# Patient Record
Sex: Male | Born: 1950 | Race: Black or African American | Hispanic: No | Marital: Married | Smoking: Former smoker
Health system: Southern US, Community
[De-identification: ages and names within clinical notes are randomized; demographics above are authoritative.]

## PROBLEM LIST (undated history)

## (undated) DIAGNOSIS — G4730 Sleep apnea, unspecified: Secondary | ICD-10-CM

## (undated) DIAGNOSIS — K219 Gastro-esophageal reflux disease without esophagitis: Secondary | ICD-10-CM

## (undated) DIAGNOSIS — N289 Disorder of kidney and ureter, unspecified: Secondary | ICD-10-CM

## (undated) DIAGNOSIS — F4310 Post-traumatic stress disorder, unspecified: Secondary | ICD-10-CM

## (undated) DIAGNOSIS — E079 Disorder of thyroid, unspecified: Secondary | ICD-10-CM

## (undated) DIAGNOSIS — Z940 Kidney transplant status: Secondary | ICD-10-CM

## (undated) DIAGNOSIS — I739 Peripheral vascular disease, unspecified: Secondary | ICD-10-CM

## (undated) DIAGNOSIS — I10 Essential (primary) hypertension: Secondary | ICD-10-CM

## (undated) DIAGNOSIS — F419 Anxiety disorder, unspecified: Secondary | ICD-10-CM

## (undated) DIAGNOSIS — E119 Type 2 diabetes mellitus without complications: Secondary | ICD-10-CM

## (undated) DIAGNOSIS — H548 Legal blindness, as defined in USA: Secondary | ICD-10-CM

## (undated) DIAGNOSIS — Z89511 Acquired absence of right leg below knee: Secondary | ICD-10-CM

## (undated) DIAGNOSIS — Z9989 Dependence on other enabling machines and devices: Secondary | ICD-10-CM

## (undated) HISTORY — PX: AV FISTULA PLACEMENT: SHX1204

## (undated) HISTORY — PX: COLONOSCOPY: SHX174

---

## 1998-04-22 ENCOUNTER — Ambulatory Visit (HOSPITAL_COMMUNITY): Admission: RE | Admit: 1998-04-22 | Discharge: 1998-04-22 | Payer: Self-pay | Admitting: Pulmonary Disease

## 1998-11-27 ENCOUNTER — Encounter: Payer: Self-pay | Admitting: Urology

## 1998-11-27 ENCOUNTER — Ambulatory Visit (HOSPITAL_COMMUNITY): Admission: RE | Admit: 1998-11-27 | Discharge: 1998-11-27 | Payer: Self-pay | Admitting: Urology

## 1998-12-06 ENCOUNTER — Encounter: Payer: Self-pay | Admitting: Urology

## 1998-12-06 ENCOUNTER — Ambulatory Visit (HOSPITAL_COMMUNITY): Admission: RE | Admit: 1998-12-06 | Discharge: 1998-12-06 | Payer: Self-pay | Admitting: Urology

## 1999-02-13 ENCOUNTER — Ambulatory Visit (HOSPITAL_COMMUNITY): Admission: RE | Admit: 1999-02-13 | Discharge: 1999-02-13 | Payer: Self-pay | Admitting: *Deleted

## 1999-10-22 ENCOUNTER — Emergency Department (HOSPITAL_COMMUNITY): Admission: EM | Admit: 1999-10-22 | Discharge: 1999-10-22 | Payer: Self-pay | Admitting: Emergency Medicine

## 1999-11-04 ENCOUNTER — Encounter: Payer: Self-pay | Admitting: *Deleted

## 1999-11-04 ENCOUNTER — Encounter: Admission: RE | Admit: 1999-11-04 | Discharge: 1999-11-04 | Payer: Self-pay | Admitting: *Deleted

## 2001-04-14 ENCOUNTER — Ambulatory Visit (HOSPITAL_COMMUNITY): Admission: RE | Admit: 2001-04-14 | Discharge: 2001-04-14 | Payer: Self-pay | Admitting: *Deleted

## 2001-04-14 ENCOUNTER — Encounter: Payer: Self-pay | Admitting: *Deleted

## 2001-08-18 ENCOUNTER — Emergency Department (HOSPITAL_COMMUNITY): Admission: EM | Admit: 2001-08-18 | Discharge: 2001-08-18 | Payer: Self-pay | Admitting: Emergency Medicine

## 2002-02-27 ENCOUNTER — Encounter: Payer: Self-pay | Admitting: Internal Medicine

## 2002-02-27 ENCOUNTER — Encounter: Admission: RE | Admit: 2002-02-27 | Discharge: 2002-02-27 | Payer: Self-pay | Admitting: Internal Medicine

## 2002-03-18 ENCOUNTER — Encounter: Payer: Self-pay | Admitting: Emergency Medicine

## 2002-03-18 ENCOUNTER — Emergency Department (HOSPITAL_COMMUNITY): Admission: EM | Admit: 2002-03-18 | Discharge: 2002-03-18 | Payer: Self-pay | Admitting: Emergency Medicine

## 2002-03-19 ENCOUNTER — Emergency Department (HOSPITAL_COMMUNITY): Admission: EM | Admit: 2002-03-19 | Discharge: 2002-03-19 | Payer: Self-pay | Admitting: *Deleted

## 2002-03-23 ENCOUNTER — Encounter: Admission: RE | Admit: 2002-03-23 | Discharge: 2002-03-23 | Payer: Self-pay | Admitting: Internal Medicine

## 2002-03-23 ENCOUNTER — Encounter: Payer: Self-pay | Admitting: Internal Medicine

## 2002-06-06 ENCOUNTER — Emergency Department (HOSPITAL_COMMUNITY): Admission: EM | Admit: 2002-06-06 | Discharge: 2002-06-06 | Payer: Self-pay | Admitting: Emergency Medicine

## 2002-09-23 ENCOUNTER — Emergency Department (HOSPITAL_COMMUNITY): Admission: EM | Admit: 2002-09-23 | Discharge: 2002-09-23 | Payer: Self-pay | Admitting: Emergency Medicine

## 2002-12-07 ENCOUNTER — Encounter: Payer: Self-pay | Admitting: Internal Medicine

## 2002-12-07 ENCOUNTER — Encounter: Admission: RE | Admit: 2002-12-07 | Discharge: 2002-12-07 | Payer: Self-pay | Admitting: Internal Medicine

## 2003-05-30 ENCOUNTER — Encounter: Admission: RE | Admit: 2003-05-30 | Discharge: 2003-05-30 | Payer: Self-pay | Admitting: Internal Medicine

## 2003-05-30 ENCOUNTER — Encounter: Payer: Self-pay | Admitting: Internal Medicine

## 2004-10-02 ENCOUNTER — Encounter: Admission: RE | Admit: 2004-10-02 | Discharge: 2004-10-02 | Payer: Self-pay | Admitting: Nephrology

## 2005-01-21 ENCOUNTER — Encounter (HOSPITAL_COMMUNITY): Admission: RE | Admit: 2005-01-21 | Discharge: 2005-04-21 | Payer: Self-pay | Admitting: Nephrology

## 2005-02-01 ENCOUNTER — Emergency Department (HOSPITAL_COMMUNITY): Admission: EM | Admit: 2005-02-01 | Discharge: 2005-02-01 | Payer: Self-pay | Admitting: Emergency Medicine

## 2005-02-04 ENCOUNTER — Emergency Department (HOSPITAL_COMMUNITY): Admission: EM | Admit: 2005-02-04 | Discharge: 2005-02-04 | Payer: Self-pay | Admitting: Emergency Medicine

## 2005-02-11 ENCOUNTER — Emergency Department (HOSPITAL_COMMUNITY): Admission: EM | Admit: 2005-02-11 | Discharge: 2005-02-11 | Payer: Self-pay | Admitting: Emergency Medicine

## 2005-02-23 ENCOUNTER — Emergency Department (HOSPITAL_COMMUNITY): Admission: EM | Admit: 2005-02-23 | Discharge: 2005-02-23 | Payer: Self-pay | Admitting: Emergency Medicine

## 2005-04-22 ENCOUNTER — Encounter (HOSPITAL_COMMUNITY): Admission: RE | Admit: 2005-04-22 | Discharge: 2005-07-21 | Payer: Self-pay | Admitting: Nephrology

## 2005-05-21 ENCOUNTER — Emergency Department (HOSPITAL_COMMUNITY): Admission: EM | Admit: 2005-05-21 | Discharge: 2005-05-21 | Payer: Self-pay | Admitting: Emergency Medicine

## 2005-07-02 ENCOUNTER — Ambulatory Visit (HOSPITAL_COMMUNITY): Admission: RE | Admit: 2005-07-02 | Discharge: 2005-07-02 | Payer: Self-pay | Admitting: Vascular Surgery

## 2005-07-09 ENCOUNTER — Emergency Department (HOSPITAL_COMMUNITY): Admission: EM | Admit: 2005-07-09 | Discharge: 2005-07-09 | Payer: Self-pay | Admitting: *Deleted

## 2005-07-30 ENCOUNTER — Encounter (HOSPITAL_COMMUNITY): Admission: RE | Admit: 2005-07-30 | Discharge: 2005-10-28 | Payer: Self-pay | Admitting: Nephrology

## 2005-09-22 ENCOUNTER — Emergency Department (HOSPITAL_COMMUNITY): Admission: EM | Admit: 2005-09-22 | Discharge: 2005-09-22 | Payer: Self-pay | Admitting: Emergency Medicine

## 2005-09-26 ENCOUNTER — Emergency Department (HOSPITAL_COMMUNITY): Admission: EM | Admit: 2005-09-26 | Discharge: 2005-09-26 | Payer: Self-pay | Admitting: Emergency Medicine

## 2005-11-06 ENCOUNTER — Encounter (HOSPITAL_COMMUNITY): Admission: RE | Admit: 2005-11-06 | Discharge: 2006-02-04 | Payer: Self-pay | Admitting: Nephrology

## 2005-12-18 ENCOUNTER — Emergency Department (HOSPITAL_COMMUNITY): Admission: EM | Admit: 2005-12-18 | Discharge: 2005-12-18 | Payer: Self-pay | Admitting: Emergency Medicine

## 2006-02-16 ENCOUNTER — Encounter (INDEPENDENT_AMBULATORY_CARE_PROVIDER_SITE_OTHER): Payer: Self-pay | Admitting: Nephrology

## 2006-02-16 ENCOUNTER — Encounter (INDEPENDENT_AMBULATORY_CARE_PROVIDER_SITE_OTHER): Payer: Self-pay | Admitting: Cardiology

## 2006-02-16 ENCOUNTER — Ambulatory Visit (HOSPITAL_COMMUNITY): Admission: RE | Admit: 2006-02-16 | Discharge: 2006-02-16 | Payer: Self-pay | Admitting: Neurology

## 2006-03-15 ENCOUNTER — Encounter (HOSPITAL_COMMUNITY): Admission: RE | Admit: 2006-03-15 | Discharge: 2006-06-13 | Payer: Self-pay | Admitting: Nephrology

## 2006-08-10 ENCOUNTER — Observation Stay (HOSPITAL_COMMUNITY): Admission: EM | Admit: 2006-08-10 | Discharge: 2006-08-11 | Payer: Self-pay | Admitting: Emergency Medicine

## 2006-08-11 ENCOUNTER — Encounter (INDEPENDENT_AMBULATORY_CARE_PROVIDER_SITE_OTHER): Payer: Self-pay | Admitting: Cardiology

## 2006-09-06 ENCOUNTER — Ambulatory Visit (HOSPITAL_COMMUNITY): Admission: RE | Admit: 2006-09-06 | Discharge: 2006-09-06 | Payer: Self-pay | Admitting: Nephrology

## 2006-11-05 ENCOUNTER — Ambulatory Visit: Payer: Self-pay | Admitting: Vascular Surgery

## 2007-02-23 ENCOUNTER — Emergency Department (HOSPITAL_COMMUNITY): Admission: EM | Admit: 2007-02-23 | Discharge: 2007-02-23 | Payer: Self-pay | Admitting: Emergency Medicine

## 2007-05-17 IMAGING — CT CT ANGIO CHEST
2 of 4 series · 19 of 36 positions shown · IV contrast (APPLIED)
Comparison: None available.

CLINICAL DATA: 55-year-old with chest pain, shortness of breath.
 CT ANGIOGRAPHY OF CHEST:
TECHNIQUE: Multidetector CT imaging of the chest was performed during bolus injection of intravenous contrast.  Multiplanar CT angiographic image reconstructions were generated to evaluate the vascular anatomy.
 Contrast:  100 cc Omnipaque 300.

[Series 4: pulm embolism 2.0 st · axial · 0.61mm/px · z∈[-328,-40]mm · 16 of 158 slices shown]
[im 7/158  lung]
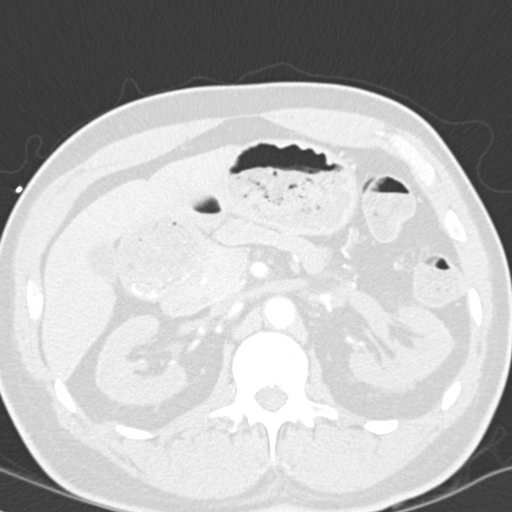
[im 19/158  mediastinal]
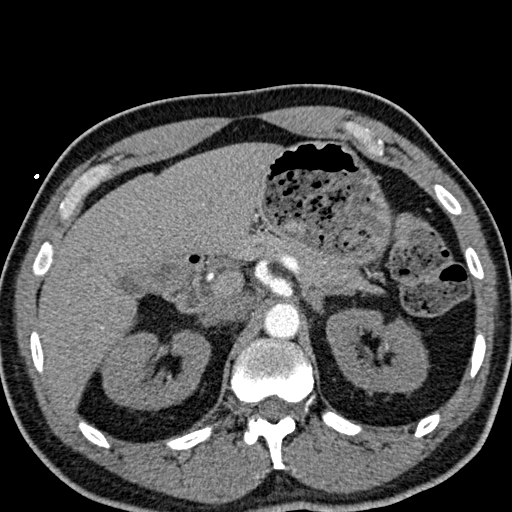
[im 25/158  lung]
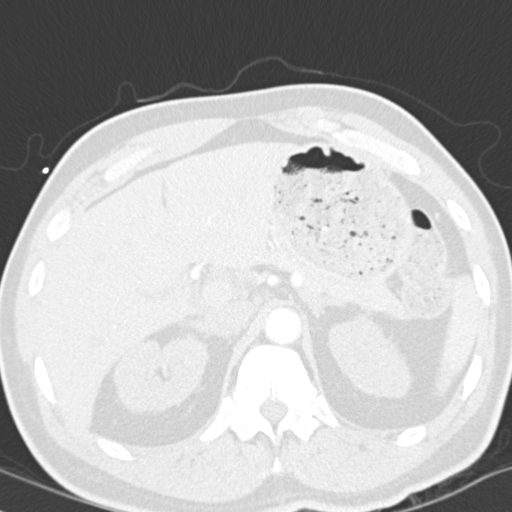
[im 37/158  mediastinal]
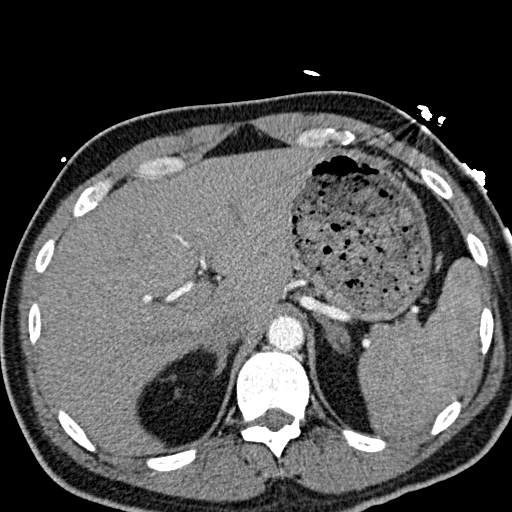
[im 49/158  lung]
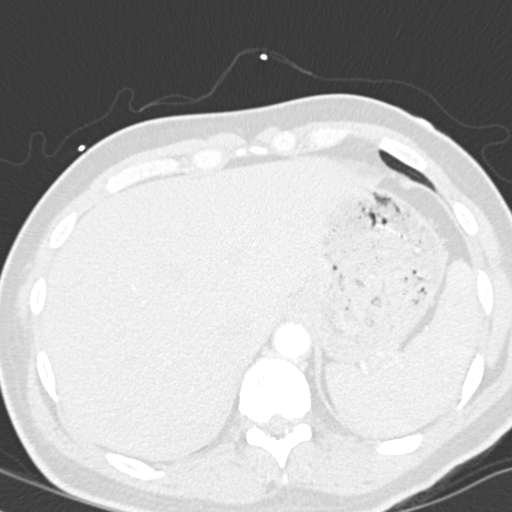
[im 55/158  mediastinal]
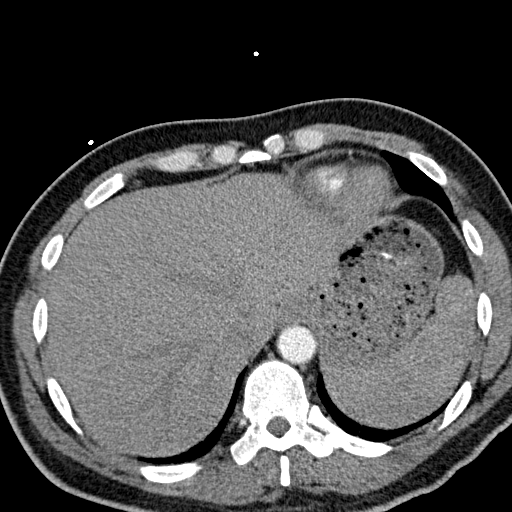
[im 67/158  lung]
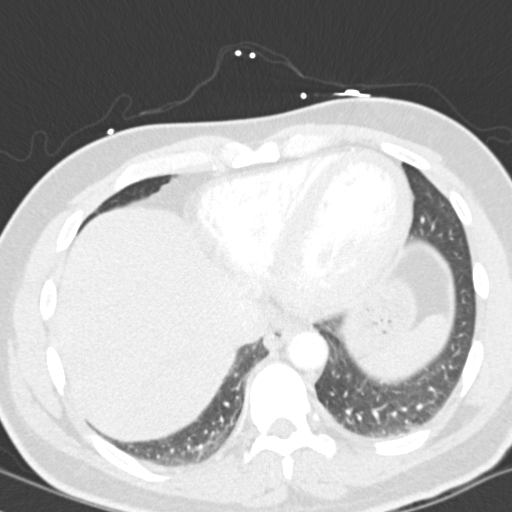
[im 73/158  mediastinal]
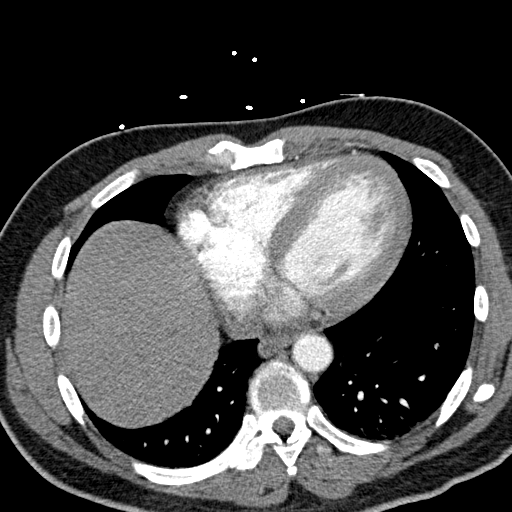
[im 85/158  lung]
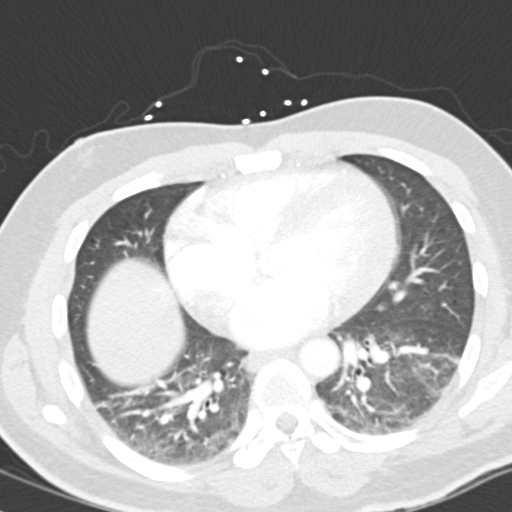
[im 91/158  mediastinal]
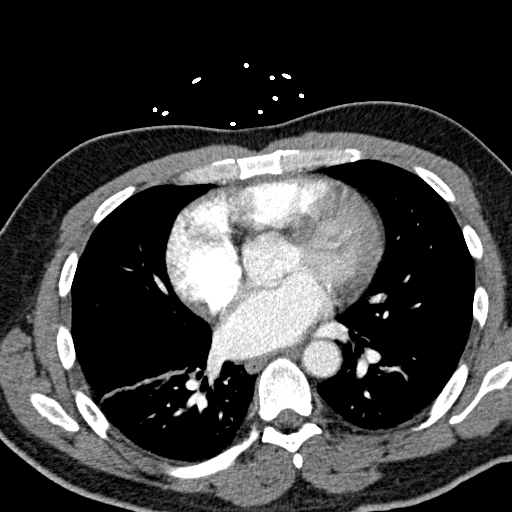
[im 103/158  lung]
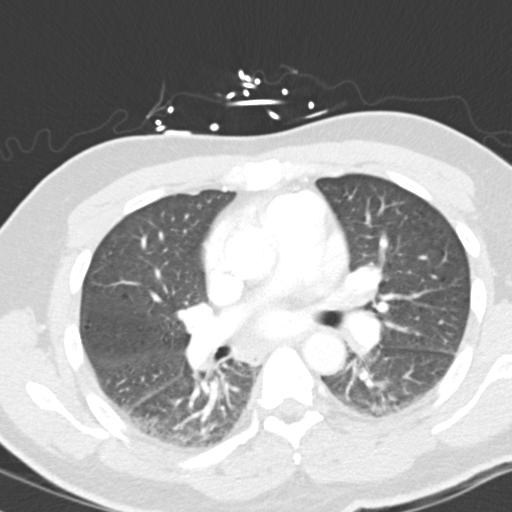
[im 109/158  mediastinal]
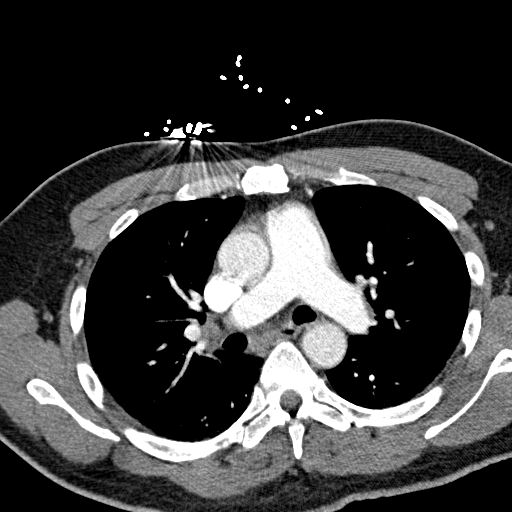
[im 121/158  lung]
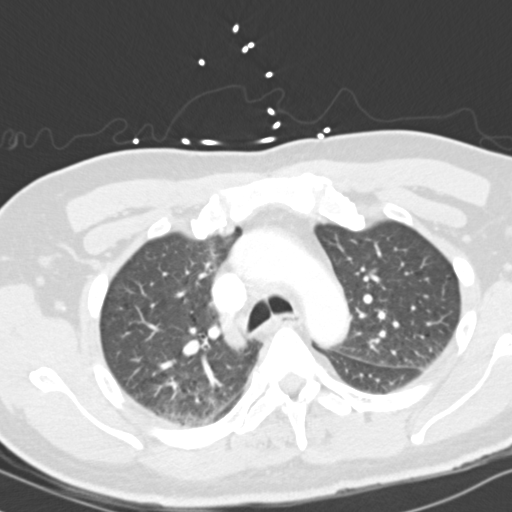
[im 133/158  mediastinal]
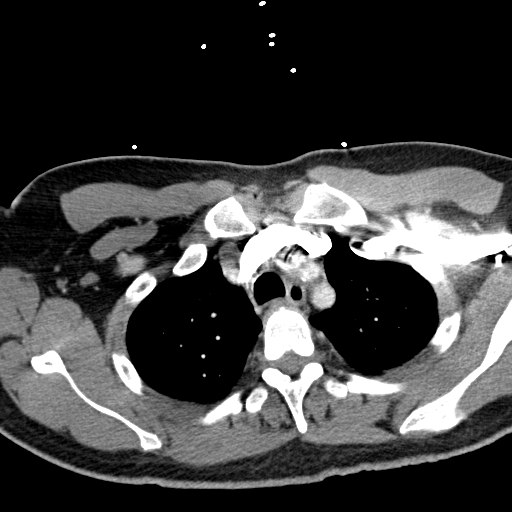
[im 139/158  lung]
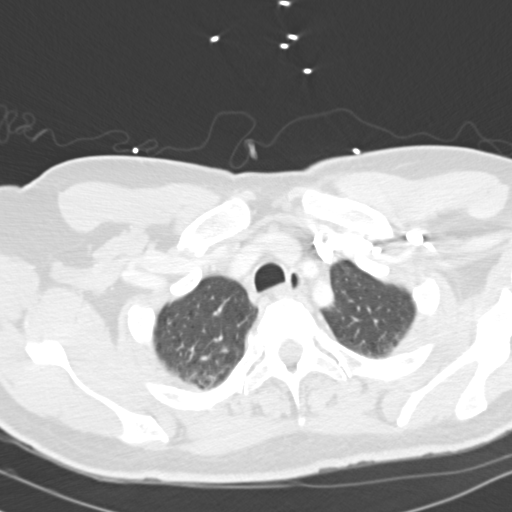
[im 151/158  mediastinal]
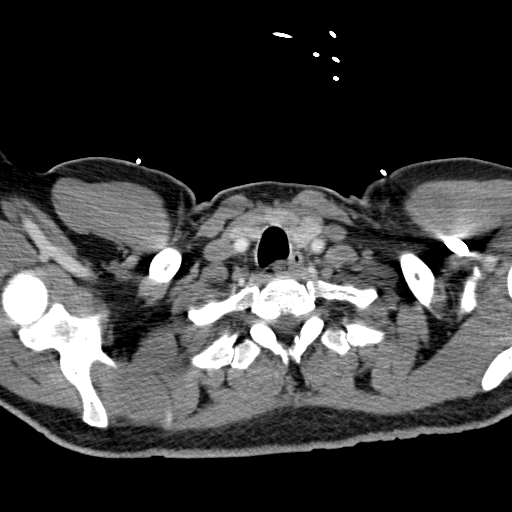

[Series 6: pulm embolism 2.0 cor · coronal · 0.63mm/px · 3 of 114 slices shown]
[im 23/114  mediastinal]
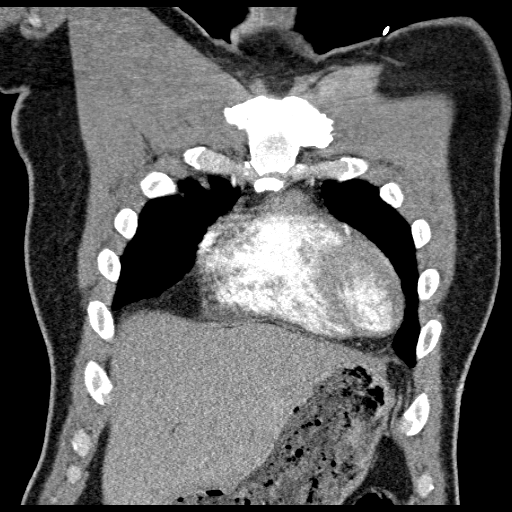
[im 46/114  mediastinal]
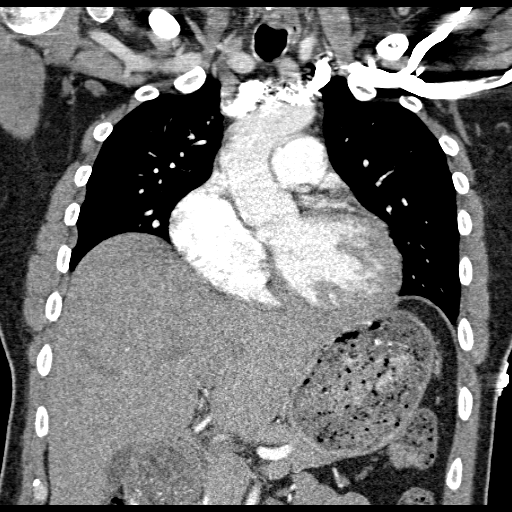
[im 68/114  mediastinal]
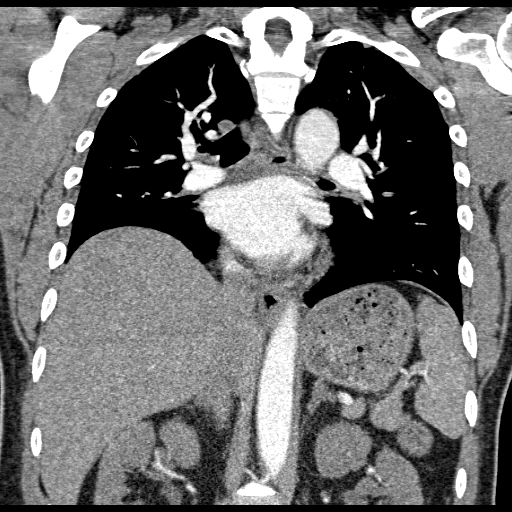

[19 of 36 positions shown; findings below may reference images not displayed]

FINDINGS: Chest wall, soft tissues and bony structures are unremarkable.
 Mild thyroid goiter on the left.  Heart size is borderline.  No pericardial effusion.  No mediastinal or hilar adenopathy.  The aorta is normal in caliber.  No dissection.  
 The pulmonary arterial tree is well opacified.  No filling defects to suggest pulmonary emboli.  
 Examination of the lung parenchyma demonstrates dependent atelectasis.  This is more pronounced in the lung bases.  No edema or effusions.  No worrisome lung masses.  The tracheobronchial tree is grossly normal.  
 The upper abdomen demonstrates no significant findings.
IMPRESSION: 1.  No CT evidence for pulmonary emboli. 
 2.  Borderline cardiac enlargement but no pericardial effusion or mediastinal or hilar adenopathy. 
 3.  Normal appearance of the thoracic aorta.  
 4.  Dependent atelectasis in the lungs but no edema or effusions.

## 2007-06-08 ENCOUNTER — Ambulatory Visit: Payer: Self-pay | Admitting: Gastroenterology

## 2007-06-13 ENCOUNTER — Encounter: Payer: Self-pay | Admitting: Gastroenterology

## 2007-06-13 ENCOUNTER — Ambulatory Visit (HOSPITAL_COMMUNITY): Admission: RE | Admit: 2007-06-13 | Discharge: 2007-06-13 | Payer: Self-pay | Admitting: Gastroenterology

## 2007-06-15 ENCOUNTER — Ambulatory Visit: Payer: Self-pay | Admitting: Vascular Surgery

## 2007-06-16 ENCOUNTER — Ambulatory Visit: Payer: Self-pay | Admitting: Gastroenterology

## 2007-09-29 HISTORY — PX: COMBINED KIDNEY-PANCREAS TRANSPLANT: SHX1382

## 2007-12-19 DIAGNOSIS — I739 Peripheral vascular disease, unspecified: Secondary | ICD-10-CM | POA: Insufficient documentation

## 2007-12-19 DIAGNOSIS — E785 Hyperlipidemia, unspecified: Secondary | ICD-10-CM | POA: Insufficient documentation

## 2007-12-19 DIAGNOSIS — F411 Generalized anxiety disorder: Secondary | ICD-10-CM | POA: Insufficient documentation

## 2007-12-19 DIAGNOSIS — N19 Unspecified kidney failure: Secondary | ICD-10-CM | POA: Insufficient documentation

## 2007-12-19 DIAGNOSIS — E1151 Type 2 diabetes mellitus with diabetic peripheral angiopathy without gangrene: Secondary | ICD-10-CM | POA: Insufficient documentation

## 2007-12-19 DIAGNOSIS — D638 Anemia in other chronic diseases classified elsewhere: Secondary | ICD-10-CM | POA: Insufficient documentation

## 2007-12-19 DIAGNOSIS — G459 Transient cerebral ischemic attack, unspecified: Secondary | ICD-10-CM | POA: Insufficient documentation

## 2007-12-19 DIAGNOSIS — D649 Anemia, unspecified: Secondary | ICD-10-CM | POA: Insufficient documentation

## 2007-12-19 DIAGNOSIS — I10 Essential (primary) hypertension: Secondary | ICD-10-CM | POA: Insufficient documentation

## 2009-07-19 ENCOUNTER — Emergency Department (HOSPITAL_COMMUNITY): Admission: EM | Admit: 2009-07-19 | Discharge: 2009-07-19 | Payer: Self-pay | Admitting: Emergency Medicine

## 2009-10-08 ENCOUNTER — Emergency Department (HOSPITAL_COMMUNITY): Admission: EM | Admit: 2009-10-08 | Discharge: 2009-10-09 | Payer: Self-pay | Admitting: Emergency Medicine

## 2009-10-08 ENCOUNTER — Emergency Department (HOSPITAL_COMMUNITY): Admission: EM | Admit: 2009-10-08 | Discharge: 2009-10-08 | Payer: Self-pay | Admitting: Emergency Medicine

## 2009-10-09 ENCOUNTER — Encounter (INDEPENDENT_AMBULATORY_CARE_PROVIDER_SITE_OTHER): Payer: Self-pay | Admitting: Emergency Medicine

## 2009-10-09 ENCOUNTER — Ambulatory Visit: Payer: Self-pay | Admitting: Vascular Surgery

## 2009-10-09 ENCOUNTER — Ambulatory Visit (HOSPITAL_COMMUNITY): Admission: RE | Admit: 2009-10-09 | Discharge: 2009-10-09 | Payer: Self-pay | Admitting: Emergency Medicine

## 2010-04-02 ENCOUNTER — Emergency Department (HOSPITAL_COMMUNITY): Admission: EM | Admit: 2010-04-02 | Discharge: 2010-04-03 | Payer: Self-pay | Admitting: Emergency Medicine

## 2010-12-14 LAB — CBC
HCT: 46.4 % (ref 39.0–52.0)
HCT: 43.8 % (ref 39.0–52.0)
Hemoglobin: 15.1 g/dL (ref 13.0–17.0)
Hemoglobin: 14.7 g/dL (ref 13.0–17.0)
MCH: 26.8 pg (ref 26.0–34.0)
MCHC: 32.6 g/dL (ref 30.0–36.0)
MCHC: 33.6 g/dL (ref 30.0–36.0)
MCV: 82.2 fL (ref 78.0–100.0)
MCV: 80.2 fL (ref 78.0–100.0)
Platelets: 171 10*3/uL (ref 150–400)
Platelets: 164 10*3/uL (ref 150–400)
RBC: 5.65 MIL/uL (ref 4.22–5.81)
RBC: 5.46 MIL/uL (ref 4.22–5.81)
RDW: 15.5 % (ref 11.5–15.5)
RDW: 15.3 % (ref 11.5–15.5)
WBC: 5.8 10*3/uL (ref 4.0–10.5)
WBC: 7 10*3/uL (ref 4.0–10.5)

## 2010-12-14 LAB — DIFFERENTIAL
Basophils Absolute: 0 10*3/uL (ref 0.0–0.1)
Basophils Absolute: 0 10*3/uL (ref 0.0–0.1)
Basophils Relative: 0 % (ref 0–1)
Basophils Relative: 1 % (ref 0–1)
Eosinophils Absolute: 0.1 10*3/uL (ref 0.0–0.7)
Eosinophils Absolute: 0.1 10*3/uL (ref 0.0–0.7)
Eosinophils Relative: 2 % (ref 0–5)
Eosinophils Relative: 2 % (ref 0–5)
Lymphocytes Relative: 19 % (ref 12–46)
Lymphocytes Relative: 15 % (ref 12–46)
Lymphs Abs: 1.1 10*3/uL (ref 0.7–4.0)
Lymphs Abs: 1 10*3/uL (ref 0.7–4.0)
Monocytes Absolute: 0.4 10*3/uL (ref 0.1–1.0)
Monocytes Absolute: 0.4 10*3/uL (ref 0.1–1.0)
Monocytes Relative: 7 % (ref 3–12)
Monocytes Relative: 6 % (ref 3–12)
Neutro Abs: 4.2 10*3/uL (ref 1.7–7.7)
Neutro Abs: 5.4 10*3/uL (ref 1.7–7.7)
Neutrophils Relative %: 72 % (ref 43–77)
Neutrophils Relative %: 77 % (ref 43–77)

## 2010-12-14 LAB — URINALYSIS, ROUTINE W REFLEX MICROSCOPIC
Bilirubin Urine: NEGATIVE
Glucose, UA: NEGATIVE mg/dL
Hgb urine dipstick: NEGATIVE
Ketones, ur: NEGATIVE mg/dL
Nitrite: NEGATIVE
Protein, ur: NEGATIVE mg/dL
Specific Gravity, Urine: 1.025 (ref 1.005–1.030)
Urobilinogen, UA: 0.2 mg/dL (ref 0.0–1.0)
pH: 5.5 (ref 5.0–8.0)

## 2010-12-14 LAB — PROTIME-INR
INR: 1.07 (ref 0.00–1.49)
Prothrombin Time: 13.8 seconds (ref 11.6–15.2)

## 2010-12-14 LAB — COMPREHENSIVE METABOLIC PANEL
ALT: 22 U/L (ref 0–53)
AST: 15 U/L (ref 0–37)
Albumin: 4.8 g/dL (ref 3.5–5.2)
Alkaline Phosphatase: 79 U/L (ref 39–117)
BUN: 17 mg/dL (ref 6–23)
CO2: 28 mEq/L (ref 19–32)
Calcium: 10 mg/dL (ref 8.4–10.5)
Chloride: 104 mEq/L (ref 96–112)
Creatinine, Ser: 1.09 mg/dL (ref 0.4–1.5)
GFR calc Af Amer: >60 mL/min (ref >60)
GFR calc non Af Amer: >60 mL/min (ref >60)
Glucose, Bld: 152 mg/dL — ABNORMAL HIGH (ref 70–99)
Potassium: 4.1 mEq/L (ref 3.5–5.1)
Sodium: 139 mEq/L (ref 135–145)
Total Bilirubin: 0.4 mg/dL (ref 0.3–1.2)
Total Protein: 7.7 g/dL (ref 6.0–8.3)

## 2010-12-14 LAB — LIPASE, BLOOD: Lipase: 23 U/L (ref 11–59)

## 2010-12-14 LAB — BASIC METABOLIC PANEL
BUN: 12 mg/dL (ref 6–23)
CO2: 27 mEq/L (ref 19–32)
Calcium: 9.8 mg/dL (ref 8.4–10.5)
Chloride: 103 mEq/L (ref 96–112)
Creatinine, Ser: 0.96 mg/dL (ref 0.4–1.5)
GFR calc Af Amer: >60 mL/min (ref >60)
GFR calc non Af Amer: >60 mL/min (ref >60)
Glucose, Bld: 121 mg/dL — ABNORMAL HIGH (ref 70–99)
Potassium: 4.1 mEq/L (ref 3.5–5.1)
Sodium: 140 mEq/L (ref 135–145)

## 2010-12-14 LAB — GLUCOSE, CAPILLARY: Glucose-Capillary: 100 mg/dL — ABNORMAL HIGH (ref 70–99)

## 2010-12-14 LAB — APTT: aPTT: 35 seconds (ref 24–37)

## 2011-01-01 LAB — GLUCOSE, CAPILLARY: Glucose-Capillary: 130 mg/dL — ABNORMAL HIGH (ref 70–99)

## 2011-02-10 NOTE — H&P (Signed)
HISTORY AND PHYSICAL EXAMINATION   June 16, 2007   Re:  DAEGAN, ARIZMENDI                DOB:  1951-03-09   HISTORY OF PRESENT ILLNESS:  The patient is a 60 year old male with  history of end stage renal disease.  He complains of pain in his left  and right legs, the left is worse than the right.  He states that he has  some occasional numbness in his feet and legs at night time.  This  occurs usually at night when at rest.  He does not have any pain in his  lower extremities with walking and walks greater than a mile with no  complaints.  He denies classic rest pain.  He states that the pain  usually resolves when he gets up and walks around.   PAST SURGICAL HISTORY:  Past surgical history is remarkable for  placement of a right upper arm AV fistula.   PAST MEDICAL HISTORY:  Past medical history is otherwise remarkable for  diabetes and hypertension.  He is a former smoker but quit 18 years ago.  He denies a history of coronary artery disease.   FAMILY HISTORY:  His family history is remarkable for his mother who  died at age 74 of a heart attack.   SOCIAL HISTORY:  He is married and works part-time as a Conservator, museum/gallery.  He does not consume alcohol regularly.  Smoking history is as above.   REVIEW OF SYSTEMS:  He is 5 feet 11 inches, 202 pounds.  He denies  shortness of breath, asthma, wheezing, GI bleeding, TIA, stroke,  syncopal episodes, seizures, or other clotting disorders.  He has  multiple joint pains and some anxiety.   MEDICATIONS:  Medications include insulin, Catapres, Xanax, and aspirin.   ALLERGIES:  HE HAS NO KNOWN DRUG ALLERGIES.   PHYSICAL EXAMINATION:  VITAL SIGNS:  Blood pressure is 147/80 in the  left arm, heart rate 75 and regular.  HEENT:  Unremarkable.  NECK:  He has 2+ carotid pulses without bruit.  CHEST:  Clear to auscultation.  CARDIAC:  Regular rate and rhythm without murmur.  He has 2+ brachial  and radial pulses in the  left arm.  He has a fistula in the right upper  arm with easily palpable thrill.  He has absent radial pulses on the  right side.  ABDOMEN:  Soft and nontender.  EXTREMITIES:  He has 2+ femoral pulses bilaterally.  He has a 2+ right  dorsalis pedis pulse and a 1+ left dorsalis pedis pulse.  He has no  posterior pedal pulses bilaterally.  He has no ulcerations on the feet.  There is no edema.   LABORATORY DATA:  He had bilateral ABI performed today, which were 0.95  on the right and 1.0 on the left.  Waveforms in ABI were consistent with  minimal arterial occlusive disease and waveforms were essentially  normal.   ASSESSMENT:  I believe the patient primarily has symptoms of neuropathy  rather than claudication in his lower extremities.  I do not believe he  warrants further followup as far as his lower extremity atherosclerotic  occlusive disease is concerned.  I will defer starting him on treatment  for his neuropathy to Dr. Terrial Rhodes in light of the fact that he  has end stage renal disease and renal failure.  The patient will follow  up with me on an as-needed basis.   Janetta Hora.  Fields, MD  Electronically Signed   CEF/MEDQ  D:  06/16/2007  T:  06/16/2007  Job:  [id number]   cc:   Terrial Rhodes, M.D.

## 2011-02-10 NOTE — Assessment & Plan Note (Signed)
[facility]                         GASTROENTEROLOGY OFFICE NOTE   ILYA, NEELY                       MRN:          [id number]  DATE:06/08/2007                            DOB:          08/25/1951    REFERRING PHYSICIAN:  Duke Salvia. Eliott Nine, M.D.   NEW GI CONSULTATION   PRIMARY CARE PHYSICIAN:  Dr. Daine Floras at the [facility].   REASON FOR REFERRAL:  Dr. Eliott Nine asked me to evaluate Mr. Crager in  consultation regarding colonoscopy and workup for kidney/pancreas  transplant.   HISTORY OF PRESENT ILLNESS:  Mr. Okray is a very pleasant 60 year old  man who has been on hemodialysis for approximately 1 year.  He is listed  for a kidney/pancreas transplant, and as part of the transplant workup  he is required to have a full colonoscopy.  He did have a colonoscopy 7  to 10 years ago by Dr. Dortha Kern and was told that this was  essentially normal.  He has never had troubles with constipation,  diarrhea, or bleeding.   REVIEW OF SYSTEMS:  Essentially normal and is available on his nursing  intake sheet.   PAST MEDICAL HISTORY:  Hypertension.  Diabetes.  Anxiety.  Kidney  failure on Tuesday, Thursday, Saturday hemodialysis.   CURRENT MEDICATIONS:  Humalog.  Lantus.  Colchicine.  Allopurinol.  Xanax.  Caduet.  Aspirin.   ALLERGIES:  No known drug allergies.   SOCIAL HISTORY:  Married with 3 children.  Works for the [employer].  Also works as a Data processing manager.  Nonsmoker.  Nondrinker.   FAMILY HISTORY:  Diabetes runs in his family.  No colon cancer or colon  polyps.   PHYSICAL EXAM:  Height 5 feet 11 inches, 203 pounds, blood pressure  120/70, pulse 60.  CONSTITUTIONAL:  Generally well-appearing.  NEUROLOGIC:  Alert and oriented x3.  EYES:  Extraocular movements are intact.  MOUTH:  Oropharynx moist.  No lesions.  NECK:  Supple.  No lymphadenopathy.  CARDIOVASCULAR:  Heart is regular rate and rhythm.  LUNGS:  Clear to auscultation bilaterally.  ABDOMEN:  Soft, nontender, nondistended, normal bowel sounds.  EXTREMITIES:  No lower extremity edema.  SKIN:  No rashes or lesions on the visible extremities.   ASSESSMENT AND PLAN:  A 60 year old man at routine risk for colorectal  cancer, awaiting kidney/pancreas transplant.   I did not mention above, but he had recent lab tests done 2 months ago  showing that he has very slight anemia with a hemoglobin of 12.9.  Liver  tests are essentially normal.  We will arrange for him to have a  colonoscopy performed at his soonest convenience, and I will communicate  those results to his [facility], as well as Dr. Camille Bal.  As  per protocol, he will take half his insulin the night before and 0 the  morning of the procedure.     Rachael Fee, MD  Electronically Signed    DPJ/MedQ  DD: 06/08/2007  DT: 06/08/2007  Job #: [id number]   cc:   Dr Tami Ribas.  Eliott Nine, M.D.

## 2011-02-13 NOTE — Consult Note (Signed)
NAME:  [name], HEAD NO.:  [id number]   MEDICAL RECORD NO.:  [id number]          PATIENT TYPE:  EMS   LOCATION:  ED                           FACILITY:  [facility]   PHYSICIAN:  Melvyn Novas, M.D.  DATE OF BIRTH:  09/28/1951   DATE OF CONSULTATION:  12/18/2005  DATE OF DISCHARGE:  12/18/2005                                   CONSULTATION   This patient arrived at the [facility] ER at 8:54 by a private  car. 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 The patient  states that first he believed it was a hypoglycemic attack and he ate a  candy bar. He felt that the tingling and the generalized weakness left him  but his left leg is still dragging and he is not quite feeling back to  normal. He has an extensive past medical history.   Diabetes insulin-dependent. He is hypertensive. He has beginning renal  failure. Had already a arteriovenous fistula placed for future hemodialysis  access. That fistula placement took place in December. The patient said that  he also is treated by a [facility] physician for his hypertension and that he  refills his medication at [facility] here in town.   The patient is currently on PhosLo, Altace, Avandia, Caduet, furosemide, and  calcitriol. Again, he feels his medication at [facility] on [address]  [phone] and I will call to get the exact doses.   SOCIAL HISTORY:  The patient works as a Forensic scientist. He is married. States  that he is a nonsmoker, nondrinker.   FAMILY HISTORY:  Positive for hypertension. That has been a grandparent with  diabetes mellitus but nobody with early onset diabetes.   Current labs have returned. The CT scan shows no acute stroke. White blood  cell count 9.4.  Hemoglobin is 12.9, hematocrit is 39.6, platelet count of  177,000. The patient's PT was 12.5 seconds. INR was 0.9. The patient felt,  after being given  some fluid and another sugary drink, much better here in  the ER without resolution of his left lower extremity heaviness and  weakness. Because of this, we are now awaiting an MRI exam. We have ordered  the MRI of the brain without gadolinium given the patient failing renal  function.   VITAL SIGNS:  Temperature is 97.7, blood pressure 162/85 automatic  measurement in a lying position left arm. The patient's pulse rate was 90,  respiratory rate is 18. The patient's lungs are clear to auscultation.  CARDIAC:  No murmur.  No carotid bruit.  No peripheral clubbing, cyanosis or  edema at this point no bruising. The fistula placement site is well-healed  and there is a thrill noted over the fistula.   The patient has the ability to extend both upper extremities against gravity  and perform a finger-nose test without dysmetria, ataxia or tremor.   He can extend both lower extremities antigravity one at that time but has a  significant drift in his left lower  extremity and I rate his strength there  for dorsiflexion, plantar flexion, knee flexion, knee extension at about 4/5  while the right side is full 5/5. He also has an upgoing toe response on his  left side, equivocal response on his right side. Deep tendon reflexes on the  upper extremities and lower extremities are attenuated. Distal vibratory  sense is not felt. Primary modalities to touch, pinprick and vibration on  the upper extremity trunk and face are intact. Cranial nerve exam was fully  intact. The patient has pupils that react equal to light. Full extraocular  movements. Full visual field bilaterally. Symmetric facial strength. Tongue  and uvula move midline. There was no objective viable or visible deficit in  his neck movement but the patient still states that his left face appears  tingling and somewhat as if it was anesthetized.   CONCLUSIONS:  This could be a possible a lacune that has not had the ability  to show up on a  regular CT scan.  We will evaluated him by MRI. The patient  can be admitted to his primary care physician service. We will probably add  an antiplatelet drugs to his current medication as it seems that he has  neither been on aspirin nor Plavix nor Aggrenox at this time.      Melvyn Novas, M.D.  Electronically Signed     CD/MEDQ  D:  12/18/2005  T:  12/19/2005  Job:  [id number]   cc:   Garnetta Buddy, M.D.  Fax: [phone]   [facility]

## 2011-02-13 NOTE — Op Note (Signed)
NAME:  [name], [name]              ACCOUNT NO.:  [id number]   MEDICAL RECORD NO.:  [id number]          PATIENT TYPE:  AMB   LOCATION:  SDS                          FACILITY:  [facility]   PHYSICIAN:  Janetta Hora. Fields, MD  DATE OF BIRTH:  08-Sep-1951   DATE OF PROCEDURE:  DATE OF DISCHARGE:                                 OPERATIVE REPORT   PROCEDURE:  Right brachial cephalic AV fistula.   PREOPERATIVE DIAGNOSIS:  End-stage renal disease.   POSTOPERATIVE DIAGNOSIS:  End-stage renal disease.   ANESTHESIA:  Local with IV sedation.   ASSISTANT:  Emilio Aspen, RNFA   OPERATIVE FINDINGS:  1.  A 4-to-5-mm cephalic vein.   OPERATIVE DETAILS:  After obtaining informed consent, the patient taken to  the operating. The patient placed in the supine position operating table.  After adequate sedation, the patient's entire right upper extremity was  prepped and draped in the usual sterile fashion. Local anesthesia was  infiltrated near the antecubital crease. A transverse incision was made in  this location and carried down through subcutaneous tissues down to the  level of the cephalic vein. The cephalic vein was dissected free  circumferentially.  Small side branches were ligated and divided between  silk ties.   Next, the brachial artery was dissected free in the medial portion of the  incision. The brachial artery was controlled with proximal distal vessel  loops. The patient was given 5000 units of intravenous heparin. A  longitudinal arteriotomy was made. The vein was transected distally and  swung over to the level of the artery. The vein was then sewn end-of-vein to  side of artery using a running 7-0 Prolene suture. Just prior to completion  the anastomosis was fore-bleed, back-bleed and thoroughly flushed.  Anastomosis was secured, clamps released. There was a palpable thrill in the  fistula immediately. Hemostasis was obtained. Subcutaneous tissues  reapproximated using a running  3-0 Vicryl suture. Skin was closed with a 4-0  Vicryl subcuticular stitch. The patient tolerated procedure well and there  were no complications.  Instrument, sponge, and needle counts were correct at the end of the case.  The patient was taken to the recovery room in stable condition.           ______________________________  Janetta Hora Fields, MD     CEF/MEDQ  D:  07/02/2005  T:  07/02/2005  Job:  [id number]

## 2011-02-13 NOTE — Discharge Summary (Signed)
NAME:  [name], [name] NO.:  [id number]   MEDICAL RECORD NO.:  [id number]          PATIENT TYPE:  INP   LOCATION:  5506                         FACILITY:  [facility]   PHYSICIAN:  Neena Rhymes, M.D. DATE OF BIRTH:  [birth year]   DATE OF ADMISSION:  08/10/2006  DATE OF DISCHARGE:  08/11/2006                                 DISCHARGE SUMMARY   DISCHARGE DIAGNOSES:  1. Chest pain.  2. End-stage renal disease.  3. Hypertension.  4. Diabetes.  5. Anemia.  6. Dyslipidemia.   PROCEDURE:  Cardiolite stress test on 08/11/2006 which was read as no  ischemia and normal LV function.   DISCHARGE MEDICATIONS:  1. Colchicine 0.6 mg daily.  2. PhosLo 1 tablet t.i.d. with food.  3. Protonix 40 mg daily.  4. Aspirin 81 mg daily.  5. Altace 20 mg daily.  6. Lasix 30 units nightly.  7. __________  5.5 daily.  8. Caduet 10/10 q.h.s.  9. Humalog 15 units q.a.m. 20 units q.p.m.  10.Metoprolol 12.5 mg b.i.d.  11.EPO 5000 units with each hemodialysis.  12.Hectorol 2 mcg at each hemodialysis.  13.Ferrlecit 62.5 mg every Thursday.   HOSPITAL COURSE:  The patient is a 60 year old gentleman who was admitted  from dialysis with chest pain.  Please see dictated H&P for details.  His  laboratory data from the day of admission 08/10/2006 are as follows:  WBC  12.4, hemoglobin 14.0, hematocrit 43.1, platelets 260, 78% neutrophils, 13%  lymphocytes, 8% monocytes.  PT 12.2, INR 0.9.  Sodium 139, potassium 3.2,  chloride 104, glucose 83, BUN 21, creatinine 3.7, CK-MB 3.5, troponin 0.07,  myoglobin greater than 500.  TSH 1.767.   HOSPITAL COURSE BY PROBLEM LIST:  Problem #1.  CHEST PAIN.  The patient was  admitted for atypical chest pain and [facility]  was consulted due to the patient's extended medical history and multiple  risk factors including hypertension, diabetes, and dyslipidemia.  He had a  Cardiolite stress test performed on 08/11/2006 which showed  normal LV  function; and no evidence of ischemia.  The patient was determined to have  noncardiac chest pain; and was started on Protonix 40 mg at the time of  admission.  At the time of discharge the patient was chest pain free.   Problem 2:  END-STAGE RENAL DISEASE.  The patient is on known hemodialysis;  and has hemodialysis on Tuesday-Thursday-Saturday at the [facility].  He was not dialyzed during his admission.  There no  changes made to his outpatient dialysis regimen.  He will return to his  regular scheduled dialysis at the [facility] tomorrow on  08/12/2006.   Problem #3:  HYPERTENSION.  The patient was admitted with a known history of  hypertension; and for this he is being treated with Caduet and Altace.  During his hospitalization his blood pressure remained elevated at 176/91.  Because of this and has extensive cardiac risk factors, [facility] started him on 12.5 mg of metoprolol b.i.d.  His blood  pressures will also likely  improved with dialysis and this issue will need  to be followed up in the outpatient setting.   Problem #4:  DIABETES.  The patient was admitted with a known history of  diabetes.  He was maintained on his home regimen of 30 units of Lantus  nightly and 16 units of Humalog every morning and 20 units of Humalog every  night.  He was discharged home without complication.   Problem #5:  ANEMIA.  The patient's hemoglobin remained stable during this  admission.  He will be discontinued on his regular schedule of EPO and iron  which he receives at dialysis.   Problem #6:  DYSLIPIDEMIA.  The patient has a known history of elevated  cholesterol for which he is taking Caduet.  He had a fasting lipid panel  performed here at [facility] which showed an LDL cholesterol within  goal at 69, an HDL cholesterol which was low at 29, and triglycerides which  are elevated at 320.  This  issue will need follow up in the outpatient  setting.   DISCHARGE LABORATORY DATA:  From 08/11/2006 is as follows: Sodium 138,  potassium 3.7, chloride 101, bicarb 25, BUN 34, creatinine 5, glucose 263,  albumin 2.4, calcium 8.2, phosphorus 5.6.  His latest cardiac panel showed a  CK of 107, MB of 2.1, relative index of 2, and troponin of 0.06.   FOLLOWUP ITEMS AT DISCHARGE:  1. Patient will need outpatient blood pressure monitoring due to the      addition of metoprolol.  2. The patient will need outpatient lipid monitoring, and possible      adjustment of his medication.      Neena Rhymes, M.D.     KT/MEDQ  D:  08/11/2006  T:  08/11/2006  Job:  [id number]

## 2011-10-07 DIAGNOSIS — Z79899 Other long term (current) drug therapy: Secondary | ICD-10-CM | POA: Diagnosis not present

## 2011-10-07 DIAGNOSIS — Z940 Kidney transplant status: Secondary | ICD-10-CM | POA: Diagnosis not present

## 2011-10-07 DIAGNOSIS — N184 Chronic kidney disease, stage 4 (severe): Secondary | ICD-10-CM | POA: Diagnosis not present

## 2011-10-07 DIAGNOSIS — N2581 Secondary hyperparathyroidism of renal origin: Secondary | ICD-10-CM | POA: Diagnosis not present

## 2011-10-07 DIAGNOSIS — D649 Anemia, unspecified: Secondary | ICD-10-CM | POA: Diagnosis not present

## 2011-10-07 DIAGNOSIS — Z9483 Pancreas transplant status: Secondary | ICD-10-CM | POA: Diagnosis not present

## 2011-10-07 DIAGNOSIS — E785 Hyperlipidemia, unspecified: Secondary | ICD-10-CM | POA: Diagnosis not present

## 2011-10-07 DIAGNOSIS — E119 Type 2 diabetes mellitus without complications: Secondary | ICD-10-CM | POA: Diagnosis not present

## 2011-10-12 DIAGNOSIS — I10 Essential (primary) hypertension: Secondary | ICD-10-CM | POA: Diagnosis not present

## 2011-10-12 DIAGNOSIS — Z940 Kidney transplant status: Secondary | ICD-10-CM | POA: Diagnosis not present

## 2011-10-12 DIAGNOSIS — E559 Vitamin D deficiency, unspecified: Secondary | ICD-10-CM | POA: Diagnosis not present

## 2011-12-16 DIAGNOSIS — M109 Gout, unspecified: Secondary | ICD-10-CM | POA: Diagnosis not present

## 2011-12-16 DIAGNOSIS — Z940 Kidney transplant status: Secondary | ICD-10-CM | POA: Diagnosis not present

## 2011-12-16 DIAGNOSIS — Z79899 Other long term (current) drug therapy: Secondary | ICD-10-CM | POA: Diagnosis not present

## 2011-12-16 DIAGNOSIS — E785 Hyperlipidemia, unspecified: Secondary | ICD-10-CM | POA: Diagnosis not present

## 2011-12-16 DIAGNOSIS — N184 Chronic kidney disease, stage 4 (severe): Secondary | ICD-10-CM | POA: Diagnosis not present

## 2011-12-16 DIAGNOSIS — Z9483 Pancreas transplant status: Secondary | ICD-10-CM | POA: Diagnosis not present

## 2011-12-16 DIAGNOSIS — D649 Anemia, unspecified: Secondary | ICD-10-CM | POA: Diagnosis not present

## 2011-12-16 DIAGNOSIS — E119 Type 2 diabetes mellitus without complications: Secondary | ICD-10-CM | POA: Diagnosis not present

## 2012-01-25 DIAGNOSIS — N184 Chronic kidney disease, stage 4 (severe): Secondary | ICD-10-CM | POA: Diagnosis not present

## 2012-01-25 DIAGNOSIS — D649 Anemia, unspecified: Secondary | ICD-10-CM | POA: Diagnosis not present

## 2012-01-25 DIAGNOSIS — Z9483 Pancreas transplant status: Secondary | ICD-10-CM | POA: Diagnosis not present

## 2012-01-25 DIAGNOSIS — Z940 Kidney transplant status: Secondary | ICD-10-CM | POA: Diagnosis not present

## 2012-01-25 DIAGNOSIS — Z79899 Other long term (current) drug therapy: Secondary | ICD-10-CM | POA: Diagnosis not present

## 2012-01-25 DIAGNOSIS — E785 Hyperlipidemia, unspecified: Secondary | ICD-10-CM | POA: Diagnosis not present

## 2012-01-29 DIAGNOSIS — E559 Vitamin D deficiency, unspecified: Secondary | ICD-10-CM | POA: Diagnosis not present

## 2012-01-29 DIAGNOSIS — Z940 Kidney transplant status: Secondary | ICD-10-CM | POA: Diagnosis not present

## 2012-01-29 DIAGNOSIS — E119 Type 2 diabetes mellitus without complications: Secondary | ICD-10-CM | POA: Diagnosis not present

## 2012-01-29 DIAGNOSIS — I10 Essential (primary) hypertension: Secondary | ICD-10-CM | POA: Diagnosis not present

## 2012-04-11 DIAGNOSIS — Z9483 Pancreas transplant status: Secondary | ICD-10-CM | POA: Diagnosis not present

## 2012-04-11 DIAGNOSIS — Z79899 Other long term (current) drug therapy: Secondary | ICD-10-CM | POA: Diagnosis not present

## 2012-04-11 DIAGNOSIS — E785 Hyperlipidemia, unspecified: Secondary | ICD-10-CM | POA: Diagnosis not present

## 2012-04-11 DIAGNOSIS — D649 Anemia, unspecified: Secondary | ICD-10-CM | POA: Diagnosis not present

## 2012-04-11 DIAGNOSIS — N184 Chronic kidney disease, stage 4 (severe): Secondary | ICD-10-CM | POA: Diagnosis not present

## 2012-04-11 DIAGNOSIS — E119 Type 2 diabetes mellitus without complications: Secondary | ICD-10-CM | POA: Diagnosis not present

## 2012-04-11 DIAGNOSIS — Z940 Kidney transplant status: Secondary | ICD-10-CM | POA: Diagnosis not present

## 2012-08-16 DIAGNOSIS — D649 Anemia, unspecified: Secondary | ICD-10-CM | POA: Diagnosis not present

## 2012-08-16 DIAGNOSIS — N184 Chronic kidney disease, stage 4 (severe): Secondary | ICD-10-CM | POA: Diagnosis not present

## 2012-08-16 DIAGNOSIS — E119 Type 2 diabetes mellitus without complications: Secondary | ICD-10-CM | POA: Diagnosis not present

## 2012-08-16 DIAGNOSIS — Z79899 Other long term (current) drug therapy: Secondary | ICD-10-CM | POA: Diagnosis not present

## 2012-08-16 DIAGNOSIS — E785 Hyperlipidemia, unspecified: Secondary | ICD-10-CM | POA: Diagnosis not present

## 2012-08-16 DIAGNOSIS — Z9483 Pancreas transplant status: Secondary | ICD-10-CM | POA: Diagnosis not present

## 2012-08-16 DIAGNOSIS — Z940 Kidney transplant status: Secondary | ICD-10-CM | POA: Diagnosis not present

## 2012-08-29 DIAGNOSIS — Z9483 Pancreas transplant status: Secondary | ICD-10-CM | POA: Diagnosis not present

## 2012-08-29 DIAGNOSIS — E559 Vitamin D deficiency, unspecified: Secondary | ICD-10-CM | POA: Diagnosis not present

## 2012-08-29 DIAGNOSIS — E119 Type 2 diabetes mellitus without complications: Secondary | ICD-10-CM | POA: Diagnosis not present

## 2012-08-29 DIAGNOSIS — Z940 Kidney transplant status: Secondary | ICD-10-CM | POA: Diagnosis not present

## 2012-08-29 DIAGNOSIS — Z23 Encounter for immunization: Secondary | ICD-10-CM | POA: Diagnosis not present

## 2012-08-29 DIAGNOSIS — I10 Essential (primary) hypertension: Secondary | ICD-10-CM | POA: Diagnosis not present

## 2012-12-16 DIAGNOSIS — N184 Chronic kidney disease, stage 4 (severe): Secondary | ICD-10-CM | POA: Diagnosis not present

## 2012-12-16 DIAGNOSIS — E119 Type 2 diabetes mellitus without complications: Secondary | ICD-10-CM | POA: Diagnosis not present

## 2012-12-16 DIAGNOSIS — D649 Anemia, unspecified: Secondary | ICD-10-CM | POA: Diagnosis not present

## 2012-12-16 DIAGNOSIS — Z79899 Other long term (current) drug therapy: Secondary | ICD-10-CM | POA: Diagnosis not present

## 2012-12-16 DIAGNOSIS — Z9483 Pancreas transplant status: Secondary | ICD-10-CM | POA: Diagnosis not present

## 2012-12-16 DIAGNOSIS — Z940 Kidney transplant status: Secondary | ICD-10-CM | POA: Diagnosis not present

## 2013-03-13 DIAGNOSIS — E119 Type 2 diabetes mellitus without complications: Secondary | ICD-10-CM | POA: Diagnosis not present

## 2013-03-13 DIAGNOSIS — D649 Anemia, unspecified: Secondary | ICD-10-CM | POA: Diagnosis not present

## 2013-03-17 DIAGNOSIS — D649 Anemia, unspecified: Secondary | ICD-10-CM | POA: Diagnosis not present

## 2013-03-17 DIAGNOSIS — Z940 Kidney transplant status: Secondary | ICD-10-CM | POA: Diagnosis not present

## 2013-03-17 DIAGNOSIS — N184 Chronic kidney disease, stage 4 (severe): Secondary | ICD-10-CM | POA: Diagnosis not present

## 2013-03-17 DIAGNOSIS — E785 Hyperlipidemia, unspecified: Secondary | ICD-10-CM | POA: Diagnosis not present

## 2013-03-17 DIAGNOSIS — Z79899 Other long term (current) drug therapy: Secondary | ICD-10-CM | POA: Diagnosis not present

## 2013-03-17 DIAGNOSIS — Z9483 Pancreas transplant status: Secondary | ICD-10-CM | POA: Diagnosis not present

## 2013-03-29 DIAGNOSIS — I10 Essential (primary) hypertension: Secondary | ICD-10-CM | POA: Diagnosis not present

## 2013-03-29 DIAGNOSIS — Z940 Kidney transplant status: Secondary | ICD-10-CM | POA: Diagnosis not present

## 2013-03-29 DIAGNOSIS — Z9483 Pancreas transplant status: Secondary | ICD-10-CM | POA: Diagnosis not present

## 2013-07-12 DIAGNOSIS — Z940 Kidney transplant status: Secondary | ICD-10-CM | POA: Diagnosis not present

## 2013-07-12 DIAGNOSIS — N184 Chronic kidney disease, stage 4 (severe): Secondary | ICD-10-CM | POA: Diagnosis not present

## 2013-07-12 DIAGNOSIS — Z9483 Pancreas transplant status: Secondary | ICD-10-CM | POA: Diagnosis not present

## 2013-07-12 DIAGNOSIS — E119 Type 2 diabetes mellitus without complications: Secondary | ICD-10-CM | POA: Diagnosis not present

## 2013-07-12 DIAGNOSIS — D649 Anemia, unspecified: Secondary | ICD-10-CM | POA: Diagnosis not present

## 2013-07-12 DIAGNOSIS — Z79899 Other long term (current) drug therapy: Secondary | ICD-10-CM | POA: Diagnosis not present

## 2013-08-14 DIAGNOSIS — E559 Vitamin D deficiency, unspecified: Secondary | ICD-10-CM | POA: Diagnosis not present

## 2013-08-14 DIAGNOSIS — Z8639 Personal history of other endocrine, nutritional and metabolic disease: Secondary | ICD-10-CM | POA: Diagnosis not present

## 2013-08-14 DIAGNOSIS — Z940 Kidney transplant status: Secondary | ICD-10-CM | POA: Diagnosis not present

## 2013-08-14 DIAGNOSIS — E1139 Type 2 diabetes mellitus with other diabetic ophthalmic complication: Secondary | ICD-10-CM | POA: Diagnosis not present

## 2013-08-14 DIAGNOSIS — I10 Essential (primary) hypertension: Secondary | ICD-10-CM | POA: Diagnosis not present

## 2013-08-14 DIAGNOSIS — E785 Hyperlipidemia, unspecified: Secondary | ICD-10-CM | POA: Diagnosis not present

## 2013-08-14 DIAGNOSIS — M109 Gout, unspecified: Secondary | ICD-10-CM | POA: Diagnosis not present

## 2013-09-29 ENCOUNTER — Encounter (HOSPITAL_COMMUNITY): Payer: Self-pay | Admitting: Emergency Medicine

## 2013-09-29 ENCOUNTER — Emergency Department (HOSPITAL_COMMUNITY): Payer: Medicare Other

## 2013-09-29 ENCOUNTER — Emergency Department (HOSPITAL_COMMUNITY)
Admission: EM | Admit: 2013-09-29 | Discharge: 2013-09-30 | Disposition: A | Discharge status: Home / Self Care | Payer: Medicare Other | Attending: Emergency Medicine | Admitting: Emergency Medicine

## 2013-09-29 DIAGNOSIS — Z79899 Other long term (current) drug therapy: Secondary | ICD-10-CM | POA: Diagnosis not present

## 2013-09-29 DIAGNOSIS — R059 Cough, unspecified: Secondary | ICD-10-CM | POA: Diagnosis not present

## 2013-09-29 DIAGNOSIS — Z87448 Personal history of other diseases of urinary system: Secondary | ICD-10-CM | POA: Diagnosis not present

## 2013-09-29 DIAGNOSIS — R51 Headache: Secondary | ICD-10-CM | POA: Insufficient documentation

## 2013-09-29 DIAGNOSIS — E119 Type 2 diabetes mellitus without complications: Secondary | ICD-10-CM | POA: Insufficient documentation

## 2013-09-29 DIAGNOSIS — R52 Pain, unspecified: Secondary | ICD-10-CM | POA: Diagnosis not present

## 2013-09-29 DIAGNOSIS — R519 Headache, unspecified: Secondary | ICD-10-CM

## 2013-09-29 DIAGNOSIS — R05 Cough: Secondary | ICD-10-CM | POA: Insufficient documentation

## 2013-09-29 DIAGNOSIS — R5381 Other malaise: Secondary | ICD-10-CM | POA: Diagnosis not present

## 2013-09-29 DIAGNOSIS — R5383 Other fatigue: Secondary | ICD-10-CM | POA: Diagnosis not present

## 2013-09-29 HISTORY — DX: Disorder of kidney and ureter, unspecified: N28.9

## 2013-09-29 HISTORY — DX: Type 2 diabetes mellitus without complications: E11.9

## 2013-09-29 LAB — POCT I-STAT TROPONIN I: Troponin i, poc: 0.03 ng/mL (ref 0.00–0.08)

## 2013-09-29 LAB — POCT I-STAT, CHEM 8
BUN: 20 mg/dL (ref 6–23)
Calcium, Ion: 1.24 mmol/L (ref 1.13–1.30)
Chloride: 102 mEq/L (ref 96–112)
Creatinine, Ser: 1 mg/dL (ref 0.50–1.35)
Glucose, Bld: 133 mg/dL — ABNORMAL HIGH (ref 70–99)
HCT: 48 % (ref 39.0–52.0)
Hemoglobin: 16.3 g/dL (ref 13.0–17.0)
Potassium: 5 mEq/L (ref 3.7–5.3)
Sodium: 141 mEq/L (ref 137–147)
TCO2: 32 mmol/L (ref 0–100)

## 2013-09-29 LAB — GLUCOSE, CAPILLARY: Glucose-Capillary: 137 mg/dL — ABNORMAL HIGH (ref 70–99)

## 2013-09-29 MED ORDER — MORPHINE SULFATE 4 MG/ML IJ SOLN
4.0000 mg | Freq: Once | INTRAMUSCULAR | Status: AC
  Administered 2013-09-30: 4 mg via INTRAVENOUS
  Filled 2013-09-29: qty 1

## 2013-09-29 MED ORDER — METOCLOPRAMIDE HCL 5 MG/ML IJ SOLN
10.0000 mg | Freq: Once | INTRAMUSCULAR | Status: AC
  Administered 2013-09-30: 10 mg via INTRAVENOUS
  Filled 2013-09-29: qty 2

## 2013-09-29 NOTE — ED Notes (Signed)
Bed: [id number] Expected date:  Expected time:  Means of arrival:  Comments: EMS 62yo M Headache, weakness

## 2013-09-29 NOTE — ED Notes (Signed)
Patient transported to CT 

## 2013-09-29 NOTE — ED Notes (Signed)
Per EMS, pt. From home with complaint of headache at 10/10 which started this afternoon and noted weakness on his right leg at 10 pm, pt.claimed that he used to be   a dialysis pt. and received a pancreas and kidney transplant. Pt. Was ambulatory toward  Ambulance.

## 2013-09-30 DIAGNOSIS — R51 Headache: Secondary | ICD-10-CM | POA: Diagnosis not present

## 2013-09-30 LAB — URINALYSIS, ROUTINE W REFLEX MICROSCOPIC
Bilirubin Urine: NEGATIVE
Glucose, UA: NEGATIVE mg/dL
Hgb urine dipstick: NEGATIVE
Ketones, ur: NEGATIVE mg/dL
Leukocytes, UA: NEGATIVE
Nitrite: NEGATIVE
Protein, ur: 30 mg/dL — AB
Specific Gravity, Urine: 1.03 (ref 1.005–1.030)
Urobilinogen, UA: 1 mg/dL (ref 0.0–1.0)
pH: 5.5 (ref 5.0–8.0)

## 2013-09-30 LAB — CBC WITH DIFFERENTIAL/PLATELET
Basophils Absolute: 0.1 10*3/uL (ref 0.0–0.1)
Basophils Relative: 1 % (ref 0–1)
Eosinophils Absolute: 0.3 10*3/uL (ref 0.0–0.7)
Eosinophils Relative: 3 % (ref 0–5)
HCT: 42.8 % (ref 39.0–52.0)
Hemoglobin: 14.4 g/dL (ref 13.0–17.0)
Lymphocytes Relative: 11 % — ABNORMAL LOW (ref 12–46)
Lymphs Abs: 1.2 10*3/uL (ref 0.7–4.0)
MCH: 28 pg (ref 26.0–34.0)
MCHC: 33.6 g/dL (ref 30.0–36.0)
MCV: 83.3 fL (ref 78.0–100.0)
Monocytes Absolute: 0.8 10*3/uL (ref 0.1–1.0)
Monocytes Relative: 8 % (ref 3–12)
Neutro Abs: 7.9 10*3/uL — ABNORMAL HIGH (ref 1.7–7.7)
Neutrophils Relative %: 77 % (ref 43–77)
Platelets: 179 10*3/uL (ref 150–400)
RBC: 5.14 MIL/uL (ref 4.22–5.81)
RDW: 13.9 % (ref 11.5–15.5)
WBC: 10.2 10*3/uL (ref 4.0–10.5)

## 2013-09-30 LAB — URINE MICROSCOPIC-ADD ON

## 2013-09-30 LAB — GLUCOSE, CAPILLARY: Glucose-Capillary: 122 mg/dL — ABNORMAL HIGH (ref 70–99)

## 2013-09-30 NOTE — ED Provider Notes (Signed)
CSN: [id number]     Arrival date & time 09/29/13  2252 History   First MD Initiated Contact with Patient 09/29/13 2310     Chief Complaint  Patient presents with  . Weakness  . Headache  . Cough    HPI Developing severe onset headache today.  He was right-sided and located behind his right ear.  He's had a headache like this one other time.  He states shortly after this he developed a severe pain in his right groin and felt as though he became weak in his right leg.  It's unclear both to me and to him whether or not he was having weakness in his right leg because of pain orbit was truly weak.  He states the strength in his leg is completely returned.  He never had weakness of his upper extremities.  His headache his are improving at this time.  No history of migraine headaches.  No photophobia phonophobia.  Fevers or chills.  No neck pain or neck stiffness.  No chest pain or shortness of breath.  Symptoms were severe in severity and now are mild to moderate in severity.  Patient is a recipient of a pancreas kidney transplant and feels as though his been doing well from that standpoint.   Past Medical History  Diagnosis Date  . Renal disorder   . Diabetes mellitus without complication    Past Surgical History  Procedure Laterality Date  . Kidney transplant     History reviewed. No pertinent family history. History  Substance Use Topics  . Smoking status: Not on file  . Smokeless tobacco: Never Used  . Alcohol Use: No    Review of Systems  All other systems reviewed and are negative.    Allergies  Review of patient's allergies indicates no known allergies.  Home Medications   Current Outpatient Rx  Name  Route  Sig  Dispense  Refill  . allopurinol (ZYLOPRIM) 300 MG tablet   Oral   Take 300 mg by mouth every morning.         Marland Kitchen amLODipine (NORVASC) 10 MG tablet   Oral   Take 10 mg by mouth every morning.         . Chlorpheniramine-DM (CORICIDIN HBP COUGH/COLD PO)  Oral   Take 1 tablet by mouth 2 (two) times daily as needed (cough).         . docusate sodium (COLACE) 100 MG capsule   Oral   Take 100 mg by mouth 2 (two) times daily.         Marland Kitchen glipiZIDE (GLUCOTROL) 10 MG tablet   Oral   Take 10 mg by mouth daily before breakfast.         . guaiFENesin-dextromethorphan (ROBITUSSIN DM) 100-10 MG/5ML syrup   Oral   Take 5 mLs by mouth every 4 (four) hours as needed for cough.         . mirtazapine (REMERON) 15 MG tablet   Oral   Take 7.5 mg by mouth at bedtime as needed (insomnia).         . mycophenolate (CELLCEPT) 250 MG capsule   Oral   Take 1,000 mg by mouth 2 (two) times daily.         . prazosin (MINIPRESS) 2 MG capsule   Oral   Take 6 mg by mouth at bedtime.         . sertraline (ZOLOFT) 100 MG tablet   Oral   Take 50 mg by mouth  at bedtime.         . simvastatin (ZOCOR) 40 MG tablet   Oral   Take 20 mg by mouth at bedtime.         . tacrolimus (PROGRAF) 1 MG capsule   Oral   Take 5-6 mg by mouth 2 (two) times daily. Take 5 capsules every morning and 6 capsules at night          BP 166/69  Pulse 89  Temp(Src) 98.8 F (37.1 C) (Oral)  Resp 12  SpO2 96% Physical Exam  Nursing note and vitals reviewed. Constitutional: He is oriented to person, place, and time. He appears well-developed and well-nourished.  HENT:  Head: Normocephalic and atraumatic.  Eyes: EOM are normal. Pupils are equal, round, and reactive to light.  Neck: Normal range of motion.  Cardiovascular: Normal rate, regular rhythm, normal heart sounds and intact distal pulses.   Pulmonary/Chest: Effort normal and breath sounds normal. No respiratory distress.  Abdominal: Soft. He exhibits no distension. There is no tenderness.  Musculoskeletal: Normal range of motion.  Neurological: He is alert and oriented to person, place, and time.  5/5 strength in major muscle groups of  bilateral upper and lower extremities. Speech normal. No facial  asymetry.   Skin: Skin is warm and dry.  Psychiatric: He has a normal mood and affect. Judgment normal.    ED Course  Procedures (including critical care time) Labs Review Labs Reviewed  CBC WITH DIFFERENTIAL - Abnormal; Notable for the following:    Neutro Abs 7.9 (*)    Lymphocytes Relative 11 (*)    All other components within normal limits  URINALYSIS, ROUTINE W REFLEX MICROSCOPIC - Abnormal; Notable for the following:    Protein, ur 30 (*)    All other components within normal limits  GLUCOSE, CAPILLARY - Abnormal; Notable for the following:    Glucose-Capillary 137 (*)    All other components within normal limits  GLUCOSE, CAPILLARY - Abnormal; Notable for the following:    Glucose-Capillary 122 (*)    All other components within normal limits  POCT I-STAT, CHEM 8 - Abnormal; Notable for the following:    Glucose, Bld 133 (*)    All other components within normal limits  URINE MICROSCOPIC-ADD ON  POCT I-STAT TROPONIN I   Imaging Review Ct Head Wo Contrast  09/30/2013   CLINICAL DATA:  Headache with right leg weakness  EXAM: CT HEAD WITHOUT CONTRAST  TECHNIQUE: Contiguous axial images were obtained from the base of the skull through the vertex without intravenous contrast.  COMPARISON:  Prior CT from 07/19/2009  FINDINGS: A few scattered hypodensities are noted within the periventricular and deep white matter, consistent with mild chronic microvascular ischemic disease. No significant cerebral atrophy. There is no acute intracranial hemorrhage or infarct. No mass lesion or midline shift. Gray-white matter differentiation is well maintained. Ventricles are normal in size without evidence of hydrocephalus. CSF containing spaces are within normal limits. No extra-axial fluid collection.  The calvarium is intact.  Orbital soft tissues are within normal limits.  The paranasal sinuses and mastoid air cells are well pneumatized and free of fluid.  Scalp soft tissues are unremarkable.   IMPRESSION: 1. No acute intracranial abnormality. 2. Mild chronic microvascular ischemic changes.   Electronically Signed   By: Rise MuBenjamin  McClintock M.D.   On: 09/30/2013 00:13  I personally reviewed the imaging tests through PACS system I reviewed available ER/hospitalization records through the EMR   EKG Interpretation  Date/Time:  Friday September 29 2013 23:20:33 EST Ventricular Rate:  95 PR Interval:  230 QRS Duration: 76 QT Interval:  334 QTC Calculation: 420 R Axis:   71 Text Interpretation:  Sinus rhythm Prolonged PR interval Consider left atrial enlargement No significant change was found Confirmed by Noelle Sease  MD, Moyses Pavey ([id number]) on 09/29/2013 11:48:44 PM            MDM   1. Headache    Patient feels much better at this time.  Ambulatory in the hall.  No weakness.  Normal neurologic exam.  Head CT normal.  Discharge home in good condition    Lyanne Co, MD 09/30/13 0202

## 2013-09-30 NOTE — ED Notes (Signed)
Pt ambulated independently.  Pt maintained a steady gait.  Pt showed no signs of distress.

## 2014-05-14 DIAGNOSIS — E1139 Type 2 diabetes mellitus with other diabetic ophthalmic complication: Secondary | ICD-10-CM | POA: Diagnosis not present

## 2014-05-14 DIAGNOSIS — Z940 Kidney transplant status: Secondary | ICD-10-CM | POA: Diagnosis not present

## 2014-05-14 DIAGNOSIS — E785 Hyperlipidemia, unspecified: Secondary | ICD-10-CM | POA: Diagnosis not present

## 2014-05-14 DIAGNOSIS — E119 Type 2 diabetes mellitus without complications: Secondary | ICD-10-CM | POA: Diagnosis not present

## 2014-05-14 DIAGNOSIS — E559 Vitamin D deficiency, unspecified: Secondary | ICD-10-CM | POA: Diagnosis not present

## 2014-05-14 DIAGNOSIS — M109 Gout, unspecified: Secondary | ICD-10-CM | POA: Diagnosis not present

## 2014-05-15 DIAGNOSIS — E1139 Type 2 diabetes mellitus with other diabetic ophthalmic complication: Secondary | ICD-10-CM | POA: Diagnosis not present

## 2014-05-15 DIAGNOSIS — I10 Essential (primary) hypertension: Secondary | ICD-10-CM | POA: Diagnosis not present

## 2014-05-15 DIAGNOSIS — E785 Hyperlipidemia, unspecified: Secondary | ICD-10-CM | POA: Diagnosis not present

## 2014-05-15 DIAGNOSIS — Z940 Kidney transplant status: Secondary | ICD-10-CM | POA: Diagnosis not present

## 2015-01-12 ENCOUNTER — Encounter (HOSPITAL_COMMUNITY): Payer: Self-pay

## 2015-01-12 ENCOUNTER — Emergency Department (HOSPITAL_COMMUNITY): Payer: Medicare Other

## 2015-01-12 ENCOUNTER — Inpatient Hospital Stay (HOSPITAL_COMMUNITY)
Admission: EM | Admit: 2015-01-12 | Discharge: 2015-01-25 | DRG: 617 | Disposition: A | Discharge status: Home Health | Payer: Medicare Other | Attending: Internal Medicine | Admitting: Internal Medicine

## 2015-01-12 DIAGNOSIS — I7092 Chronic total occlusion of artery of the extremities: Secondary | ICD-10-CM | POA: Diagnosis present

## 2015-01-12 DIAGNOSIS — H5442 Blindness, left eye, normal vision right eye: Secondary | ICD-10-CM | POA: Diagnosis present

## 2015-01-12 DIAGNOSIS — D72829 Elevated white blood cell count, unspecified: Secondary | ICD-10-CM | POA: Diagnosis present

## 2015-01-12 DIAGNOSIS — Z9483 Pancreas transplant status: Secondary | ICD-10-CM

## 2015-01-12 DIAGNOSIS — L97509 Non-pressure chronic ulcer of other part of unspecified foot with unspecified severity: Secondary | ICD-10-CM | POA: Insufficient documentation

## 2015-01-12 DIAGNOSIS — F4310 Post-traumatic stress disorder, unspecified: Secondary | ICD-10-CM | POA: Diagnosis present

## 2015-01-12 DIAGNOSIS — L03119 Cellulitis of unspecified part of limb: Secondary | ICD-10-CM

## 2015-01-12 DIAGNOSIS — L089 Local infection of the skin and subcutaneous tissue, unspecified: Secondary | ICD-10-CM | POA: Diagnosis not present

## 2015-01-12 DIAGNOSIS — E119 Type 2 diabetes mellitus without complications: Secondary | ICD-10-CM | POA: Diagnosis not present

## 2015-01-12 DIAGNOSIS — Z87891 Personal history of nicotine dependence: Secondary | ICD-10-CM

## 2015-01-12 DIAGNOSIS — L02612 Cutaneous abscess of left foot: Secondary | ICD-10-CM | POA: Diagnosis not present

## 2015-01-12 DIAGNOSIS — E08329 Diabetes mellitus due to underlying condition with mild nonproliferative diabetic retinopathy without macular edema: Secondary | ICD-10-CM

## 2015-01-12 DIAGNOSIS — G4730 Sleep apnea, unspecified: Secondary | ICD-10-CM | POA: Diagnosis present

## 2015-01-12 DIAGNOSIS — E11621 Type 2 diabetes mellitus with foot ulcer: Principal | ICD-10-CM | POA: Diagnosis present

## 2015-01-12 DIAGNOSIS — I739 Peripheral vascular disease, unspecified: Secondary | ICD-10-CM | POA: Insufficient documentation

## 2015-01-12 DIAGNOSIS — Z940 Kidney transplant status: Secondary | ICD-10-CM

## 2015-01-12 DIAGNOSIS — I70262 Atherosclerosis of native arteries of extremities with gangrene, left leg: Secondary | ICD-10-CM | POA: Diagnosis not present

## 2015-01-12 DIAGNOSIS — L02619 Cutaneous abscess of unspecified foot: Secondary | ICD-10-CM | POA: Diagnosis present

## 2015-01-12 DIAGNOSIS — I10 Essential (primary) hypertension: Secondary | ICD-10-CM | POA: Diagnosis present

## 2015-01-12 DIAGNOSIS — N189 Chronic kidney disease, unspecified: Secondary | ICD-10-CM | POA: Diagnosis present

## 2015-01-12 DIAGNOSIS — I129 Hypertensive chronic kidney disease with stage 1 through stage 4 chronic kidney disease, or unspecified chronic kidney disease: Secondary | ICD-10-CM | POA: Diagnosis present

## 2015-01-12 DIAGNOSIS — M7989 Other specified soft tissue disorders: Secondary | ICD-10-CM | POA: Diagnosis not present

## 2015-01-12 DIAGNOSIS — E1165 Type 2 diabetes mellitus with hyperglycemia: Secondary | ICD-10-CM | POA: Diagnosis present

## 2015-01-12 DIAGNOSIS — L97529 Non-pressure chronic ulcer of other part of left foot with unspecified severity: Secondary | ICD-10-CM | POA: Diagnosis present

## 2015-01-12 DIAGNOSIS — L03116 Cellulitis of left lower limb: Secondary | ICD-10-CM | POA: Diagnosis not present

## 2015-01-12 HISTORY — DX: Peripheral vascular disease, unspecified: I73.9

## 2015-01-12 HISTORY — DX: Sleep apnea, unspecified: G47.30

## 2015-01-12 HISTORY — DX: Post-traumatic stress disorder, unspecified: F43.10

## 2015-01-12 HISTORY — DX: Legal blindness, as defined in USA: H54.8

## 2015-01-12 HISTORY — DX: Dependence on other enabling machines and devices: Z99.89

## 2015-01-12 MED ORDER — VANCOMYCIN HCL IN DEXTROSE 1-5 GM/200ML-% IV SOLN
1000.0000 mg | Freq: Once | INTRAVENOUS | Status: AC
  Administered 2015-01-13: 1000 mg via INTRAVENOUS
  Filled 2015-01-12: qty 200

## 2015-01-12 MED ORDER — PIPERACILLIN-TAZOBACTAM 3.375 G IVPB
3.3750 g | Freq: Once | INTRAVENOUS | Status: AC
  Administered 2015-01-13: 3.375 g via INTRAVENOUS
  Filled 2015-01-12: qty 50

## 2015-01-12 NOTE — ED Provider Notes (Signed)
CSN: [id number]     Arrival date & time 01/12/15  2008 History   First MD Initiated Contact with Patient 01/12/15 2159     Chief Complaint  Patient presents with  . Foot Problem     The history is provided by the patient. No language interpreter was used.   Mr. Minix presents for evaluation of foot pain.  He filed a callous down on the bottom of his left foot a week ago and has increased pain and swelling at that area for the last week.  He has difficulty walking due to swelling and pain.  A week ago it leaked a large amount of bloody material and a small amount of pus.  He denies any fevers, chills, malaise, vomiting.  He has a hx/o DM, kidney/pancreas transplant.  The swelling and redness are worsening so he presents today for evaluation.  Sxs are moderate, constant, worsening.    Past Medical History  Diagnosis Date  . Renal disorder   . Diabetes mellitus without complication   . CPAP (continuous positive airway pressure) dependence   . Sleep apnea   . Legally blind in left eye, as defined in [place]   . PTSD (post-traumatic stress disorder)    Past Surgical History  Procedure Laterality Date  . Combined kidney-pancreas transplant Left 2009   History reviewed. No pertinent family history. History  Substance Use Topics  . Smoking status: Former Games developer  . Smokeless tobacco: Never Used  . Alcohol Use: Yes     Comment: 1 beer rarely     Review of Systems  All other systems reviewed and are negative.     Allergies  Review of patient's allergies indicates no known allergies.  Home Medications   Prior to Admission medications   Medication Sig Start Date End Date Taking? Authorizing Provider  allopurinol (ZYLOPRIM) 300 MG tablet Take 300 mg by mouth 2 (two) times daily.    Yes Historical Provider, MD  amLODipine (NORVASC) 10 MG tablet Take 10 mg by mouth every morning.   Yes Historical Provider, MD  Chlorpheniramine-DM (CORICIDIN HBP COUGH/COLD PO) Take 1 tablet by mouth 2  (two) times daily as needed (cough).   Yes Historical Provider, MD  docusate sodium (COLACE) 100 MG capsule Take 100 mg by mouth 2 (two) times daily as needed for mild constipation.    Yes Historical Provider, MD  glipiZIDE (GLUCOTROL) 10 MG tablet Take 10 mg by mouth daily before breakfast.   Yes Historical Provider, MD  guaiFENesin-dextromethorphan (ROBITUSSIN DM) 100-10 MG/5ML syrup Take 5 mLs by mouth every 4 (four) hours as needed for cough.   Yes Historical Provider, MD  mirtazapine (REMERON) 15 MG tablet Take 7.5 mg by mouth at bedtime as needed (insomnia).   Yes Historical Provider, MD  mycophenolate (CELLCEPT) 250 MG capsule Take 1,000 mg by mouth 2 (two) times daily.   Yes Historical Provider, MD  prazosin (MINIPRESS) 2 MG capsule Take 6 mg by mouth at bedtime.   Yes Historical Provider, MD  sertraline (ZOLOFT) 100 MG tablet Take 50 mg by mouth at bedtime.   Yes Historical Provider, MD  simvastatin (ZOCOR) 40 MG tablet Take 20 mg by mouth at bedtime.   Yes Historical Provider, MD  tacrolimus (PROGRAF) 1 MG capsule Take 5 mg by mouth 2 (two) times daily.    Yes Historical Provider, MD   BP 151/73 mmHg  Pulse 95  Temp(Src) 98.2 F (36.8 C) (Oral)  Resp 16  SpO2 99% Physical Exam  Constitutional:  He is oriented to person, place, and time. He appears well-developed and well-nourished.  HENT:  Head: Normocephalic and atraumatic.  Cardiovascular: Normal rate and regular rhythm.   No murmur heard. Pulmonary/Chest: Effort normal and breath sounds normal. No respiratory distress.  Abdominal: Soft. There is no tenderness. There is no rebound and no guarding.  Musculoskeletal:  Plantar surface of left great toe with moderate swelling and fluctuance. 2+ dp pulses. Moderate, diffuse swelling and tenderness of the left foot with mild edema.    Neurological: He is alert and oriented to person, place, and time.  Skin: Skin is warm and dry.  Psychiatric: He has a normal mood and affect. His  behavior is normal.  Nursing note and vitals reviewed.   ED Course  Procedures (including critical care time) Labs Review Labs Reviewed  COMPREHENSIVE METABOLIC PANEL - Abnormal; Notable for the following:    Sodium 133 (*)    Glucose, Bld 308 (*)    GFR calc non Af Amer 78 (*)    All other components within normal limits  CBC WITH DIFFERENTIAL/PLATELET - Abnormal; Notable for the following:    WBC 12.2 (*)    Neutrophils Relative % 80 (*)    Neutro Abs 9.8 (*)    All other components within normal limits    Imaging Review Dg Foot 2 Views Left  01/13/2015   CLINICAL DATA:  Left foot pain and infection for 1 week.  Diabetes  EXAM: LEFT FOOT - 2 VIEW  COMPARISON:  None.  FINDINGS: Extensive soft tissue gas in the medial forefoot, around the first MTP joint. Gas overlaps the medial joint, and may reach the medial sesamoid. There is no definitive osteomyelitis or septic arthritis. No dissecting subcutaneous gas.  No acute fracture dislocation. Diffuse arterial calcification consistent with a diabetes history.  IMPRESSION: Soft tissue gas around the first MTP joint consistent with ulceration or necrotizing infection. Although there is no definitive osteomyelitis, there is very close proximity of bone and gas.   Electronically Signed   By: Marnee SpringJonathon  Watts M.D.   On: 01/13/2015 00:14     EKG Interpretation None      MDM   Final diagnoses:  Cellulitis and abscess of foot    Patient with history of diabetes and kidney and pancreas transplant here for evaluation of swelling and foot pain. Examination is consistent with acute abscess and cellulitis of the left foot. Patient is nontoxic appearing. Patient started on broad-spectrum antibiotics given his immunocompromise status. Discussed with hospitalist regarding admission. Orthopedics consulted regarding abscess.    Tilden FossaElizabeth Iliyana Convey, MD 01/13/15 [phone]

## 2015-01-12 NOTE — ED Notes (Signed)
Onset 1 week pt had callous on bottom of foot near left great toe.  Pt "sanded" area too much, site drained yellow bloody drainage.  Since then bottom of foot and top of foot and left great toe painful to touch and redness noted.

## 2015-01-13 ENCOUNTER — Encounter (HOSPITAL_COMMUNITY)
Admission: EM | Disposition: A | Discharge status: Home Health | Payer: Self-pay | Source: Home / Self Care | Attending: Internal Medicine

## 2015-01-13 ENCOUNTER — Inpatient Hospital Stay (HOSPITAL_COMMUNITY): Payer: Medicare Other | Admitting: Anesthesiology

## 2015-01-13 DIAGNOSIS — L02619 Cutaneous abscess of unspecified foot: Secondary | ICD-10-CM | POA: Diagnosis present

## 2015-01-13 DIAGNOSIS — I70245 Atherosclerosis of native arteries of left leg with ulceration of other part of foot: Secondary | ICD-10-CM | POA: Diagnosis not present

## 2015-01-13 DIAGNOSIS — Z87891 Personal history of nicotine dependence: Secondary | ICD-10-CM | POA: Diagnosis not present

## 2015-01-13 DIAGNOSIS — I10 Essential (primary) hypertension: Secondary | ICD-10-CM

## 2015-01-13 DIAGNOSIS — L03119 Cellulitis of unspecified part of limb: Secondary | ICD-10-CM | POA: Diagnosis not present

## 2015-01-13 DIAGNOSIS — Z940 Kidney transplant status: Secondary | ICD-10-CM | POA: Diagnosis not present

## 2015-01-13 DIAGNOSIS — I129 Hypertensive chronic kidney disease with stage 1 through stage 4 chronic kidney disease, or unspecified chronic kidney disease: Secondary | ICD-10-CM | POA: Diagnosis present

## 2015-01-13 DIAGNOSIS — I96 Gangrene, not elsewhere classified: Secondary | ICD-10-CM | POA: Diagnosis not present

## 2015-01-13 DIAGNOSIS — L02612 Cutaneous abscess of left foot: Secondary | ICD-10-CM | POA: Diagnosis present

## 2015-01-13 DIAGNOSIS — D72829 Elevated white blood cell count, unspecified: Secondary | ICD-10-CM | POA: Diagnosis not present

## 2015-01-13 DIAGNOSIS — F4310 Post-traumatic stress disorder, unspecified: Secondary | ICD-10-CM | POA: Diagnosis present

## 2015-01-13 DIAGNOSIS — I739 Peripheral vascular disease, unspecified: Secondary | ICD-10-CM | POA: Diagnosis not present

## 2015-01-13 DIAGNOSIS — Z9483 Pancreas transplant status: Secondary | ICD-10-CM | POA: Diagnosis not present

## 2015-01-13 DIAGNOSIS — G4730 Sleep apnea, unspecified: Secondary | ICD-10-CM | POA: Diagnosis present

## 2015-01-13 DIAGNOSIS — E119 Type 2 diabetes mellitus without complications: Secondary | ICD-10-CM

## 2015-01-13 DIAGNOSIS — L97529 Non-pressure chronic ulcer of other part of left foot with unspecified severity: Secondary | ICD-10-CM | POA: Diagnosis not present

## 2015-01-13 DIAGNOSIS — M7989 Other specified soft tissue disorders: Secondary | ICD-10-CM | POA: Diagnosis not present

## 2015-01-13 DIAGNOSIS — M87072 Idiopathic aseptic necrosis of left ankle: Secondary | ICD-10-CM | POA: Diagnosis not present

## 2015-01-13 DIAGNOSIS — N189 Chronic kidney disease, unspecified: Secondary | ICD-10-CM | POA: Diagnosis present

## 2015-01-13 DIAGNOSIS — E1142 Type 2 diabetes mellitus with diabetic polyneuropathy: Secondary | ICD-10-CM | POA: Diagnosis not present

## 2015-01-13 DIAGNOSIS — M009 Pyogenic arthritis, unspecified: Secondary | ICD-10-CM | POA: Diagnosis not present

## 2015-01-13 DIAGNOSIS — E11621 Type 2 diabetes mellitus with foot ulcer: Secondary | ICD-10-CM | POA: Diagnosis not present

## 2015-01-13 DIAGNOSIS — E1165 Type 2 diabetes mellitus with hyperglycemia: Secondary | ICD-10-CM | POA: Diagnosis present

## 2015-01-13 DIAGNOSIS — I70262 Atherosclerosis of native arteries of extremities with gangrene, left leg: Secondary | ICD-10-CM | POA: Diagnosis present

## 2015-01-13 DIAGNOSIS — I7092 Chronic total occlusion of artery of the extremities: Secondary | ICD-10-CM | POA: Diagnosis present

## 2015-01-13 DIAGNOSIS — H5442 Blindness, left eye, normal vision right eye: Secondary | ICD-10-CM | POA: Diagnosis present

## 2015-01-13 HISTORY — PX: INCISION AND DRAINAGE ABSCESS: SHX5864

## 2015-01-13 LAB — COMPREHENSIVE METABOLIC PANEL
ALT: 9 U/L (ref 0–53)
AST: 12 U/L (ref 0–37)
Albumin: 4 g/dL (ref 3.5–5.2)
Alkaline Phosphatase: 85 U/L (ref 39–117)
Anion gap: 12 (ref 5–15)
BUN: 13 mg/dL (ref 6–23)
CO2: 24 mmol/L (ref 19–32)
Calcium: 9.6 mg/dL (ref 8.4–10.5)
Chloride: 97 mmol/L (ref 96–112)
Creatinine, Ser: 1.01 mg/dL (ref 0.50–1.35)
GFR calc Af Amer: >90 mL/min (ref >90)
GFR calc non Af Amer: 78 mL/min — ABNORMAL LOW (ref >90)
Glucose, Bld: 308 mg/dL — ABNORMAL HIGH (ref 70–99)
Potassium: 4.5 mmol/L (ref 3.5–5.1)
Sodium: 133 mmol/L — ABNORMAL LOW (ref 135–145)
Total Bilirubin: 0.9 mg/dL (ref 0.3–1.2)
Total Protein: 8 g/dL (ref 6.0–8.3)

## 2015-01-13 LAB — CBC WITH DIFFERENTIAL/PLATELET
Basophils Absolute: 0 10*3/uL (ref 0.0–0.1)
Basophils Relative: 0 % (ref 0–1)
Eosinophils Absolute: 0.1 10*3/uL (ref 0.0–0.7)
Eosinophils Relative: 1 % (ref 0–5)
HCT: 41.8 % (ref 39.0–52.0)
Hemoglobin: 13.5 g/dL (ref 13.0–17.0)
Lymphocytes Relative: 12 % (ref 12–46)
Lymphs Abs: 1.4 10*3/uL (ref 0.7–4.0)
MCH: 26.4 pg (ref 26.0–34.0)
MCHC: 32.3 g/dL (ref 30.0–36.0)
MCV: 81.8 fL (ref 78.0–100.0)
Monocytes Absolute: 0.9 10*3/uL (ref 0.1–1.0)
Monocytes Relative: 7 % (ref 3–12)
Neutro Abs: 9.8 10*3/uL — ABNORMAL HIGH (ref 1.7–7.7)
Neutrophils Relative %: 80 % — ABNORMAL HIGH (ref 43–77)
Platelets: 192 10*3/uL (ref 150–400)
RBC: 5.11 MIL/uL (ref 4.22–5.81)
RDW: 13.6 % (ref 11.5–15.5)
WBC: 12.2 10*3/uL — ABNORMAL HIGH (ref 4.0–10.5)

## 2015-01-13 LAB — SURGICAL PCR SCREEN
MRSA, PCR: NEGATIVE
Staphylococcus aureus: NEGATIVE

## 2015-01-13 LAB — GLUCOSE, CAPILLARY
Glucose-Capillary: 274 mg/dL — ABNORMAL HIGH (ref 70–99)
Glucose-Capillary: 228 mg/dL — ABNORMAL HIGH (ref 70–99)
Glucose-Capillary: 198 mg/dL — ABNORMAL HIGH (ref 70–99)

## 2015-01-13 LAB — BASIC METABOLIC PANEL
Anion gap: 12 (ref 5–15)
BUN: 12 mg/dL (ref 6–23)
CO2: 23 mmol/L (ref 19–32)
Calcium: 9 mg/dL (ref 8.4–10.5)
Chloride: 100 mmol/L (ref 96–112)
Creatinine, Ser: 0.79 mg/dL (ref 0.50–1.35)
GFR calc Af Amer: >90 mL/min (ref >90)
GFR calc non Af Amer: >90 mL/min (ref >90)
Glucose, Bld: 230 mg/dL — ABNORMAL HIGH (ref 70–99)
Potassium: 4 mmol/L (ref 3.5–5.1)
Sodium: 135 mmol/L (ref 135–145)

## 2015-01-13 LAB — CBC
HCT: 37.3 % — ABNORMAL LOW (ref 39.0–52.0)
Hemoglobin: 12 g/dL — ABNORMAL LOW (ref 13.0–17.0)
MCH: 26 pg (ref 26.0–34.0)
MCHC: 32.2 g/dL (ref 30.0–36.0)
MCV: 80.9 fL (ref 78.0–100.0)
Platelets: 167 10*3/uL (ref 150–400)
RBC: 4.61 MIL/uL (ref 4.22–5.81)
RDW: 13.7 % (ref 11.5–15.5)
WBC: 10.3 10*3/uL (ref 4.0–10.5)

## 2015-01-13 SURGERY — INCISION AND DRAINAGE, ABSCESS
Anesthesia: General | Site: Foot | Laterality: Left

## 2015-01-13 MED ORDER — METHOCARBAMOL 1000 MG/10ML IJ SOLN
500.0000 mg | Freq: Four times a day (QID) | INTRAMUSCULAR | Status: DC | PRN
  Filled 2015-01-13: qty 5

## 2015-01-13 MED ORDER — MIRTAZAPINE 7.5 MG PO TABS
7.5000 mg | ORAL_TABLET | Freq: Every evening | ORAL | Status: DC | PRN
  Filled 2015-01-13: qty 1

## 2015-01-13 MED ORDER — SODIUM CHLORIDE 0.9 % IV SOLN
INTRAVENOUS | Status: DC
  Administered 2015-01-13 (×2): via INTRAVENOUS

## 2015-01-13 MED ORDER — SIMVASTATIN 20 MG PO TABS
20.0000 mg | ORAL_TABLET | Freq: Every day | ORAL | Status: DC
  Administered 2015-01-13 – 2015-01-24 (×11): 20 mg via ORAL
  Filled 2015-01-13 (×13): qty 1

## 2015-01-13 MED ORDER — DIPHENHYDRAMINE HCL 12.5 MG/5ML PO ELIX
12.5000 mg | ORAL_SOLUTION | ORAL | Status: DC | PRN
  Filled 2015-01-13: qty 10

## 2015-01-13 MED ORDER — ACETAMINOPHEN 650 MG RE SUPP: 650.0000 mg | Freq: Four times a day (QID) | RECTAL | Status: DC | PRN

## 2015-01-13 MED ORDER — HYDROMORPHONE HCL 1 MG/ML IJ SOLN
1.0000 mg | INTRAMUSCULAR | Status: DC | PRN
  Administered 2015-01-15 – 2015-01-16 (×2): 1 mg via INTRAVENOUS
  Filled 2015-01-13 (×2): qty 1

## 2015-01-13 MED ORDER — INSULIN ASPART 100 UNIT/ML ~~LOC~~ SOLN
0.0000 [IU] | SUBCUTANEOUS | Status: DC
  Administered 2015-01-13: 3 [IU] via SUBCUTANEOUS
  Administered 2015-01-13: 5 [IU] via SUBCUTANEOUS

## 2015-01-13 MED ORDER — INSULIN GLARGINE 100 UNIT/ML ~~LOC~~ SOLN
20.0000 [IU] | Freq: Once | SUBCUTANEOUS | Status: DC
  Filled 2015-01-13: qty 0.2

## 2015-01-13 MED ORDER — VANCOMYCIN HCL IN DEXTROSE 1-5 GM/200ML-% IV SOLN
1000.0000 mg | Freq: Two times a day (BID) | INTRAVENOUS | Status: DC
  Administered 2015-01-13 – 2015-01-16 (×8): 1000 mg via INTRAVENOUS
  Filled 2015-01-13 (×10): qty 200

## 2015-01-13 MED ORDER — FENTANYL CITRATE (PF) 100 MCG/2ML IJ SOLN
INTRAMUSCULAR | Status: DC | PRN
  Administered 2015-01-13 (×2): 50 ug via INTRAVENOUS

## 2015-01-13 MED ORDER — GLIPIZIDE 5 MG PO TABS
10.0000 mg | ORAL_TABLET | Freq: Every day | ORAL | Status: DC
  Administered 2015-01-13 – 2015-01-16 (×4): 10 mg via ORAL
  Filled 2015-01-13 (×4): qty 2

## 2015-01-13 MED ORDER — HEPARIN SODIUM (PORCINE) 5000 UNIT/ML IJ SOLN
5000.0000 [IU] | Freq: Three times a day (TID) | INTRAMUSCULAR | Status: DC
  Administered 2015-01-13 – 2015-01-20 (×23): 5000 [IU] via SUBCUTANEOUS
  Filled 2015-01-13 (×22): qty 1

## 2015-01-13 MED ORDER — OXYCODONE HCL 5 MG PO TABS: 5.0000 mg | ORAL_TABLET | Freq: Once | ORAL | Status: AC | PRN

## 2015-01-13 MED ORDER — PIPERACILLIN-TAZOBACTAM 3.375 G IVPB
3.3750 g | Freq: Three times a day (TID) | INTRAVENOUS | Status: DC
  Administered 2015-01-13 – 2015-01-16 (×10): 3.375 g via INTRAVENOUS
  Filled 2015-01-13 (×13): qty 50

## 2015-01-13 MED ORDER — ONDANSETRON HCL 4 MG/2ML IJ SOLN: 4.0000 mg | Freq: Four times a day (QID) | INTRAMUSCULAR | Status: DC | PRN

## 2015-01-13 MED ORDER — ONDANSETRON HCL 4 MG/2ML IJ SOLN
INTRAMUSCULAR | Status: DC | PRN
  Administered 2015-01-13: 4 mg via INTRAVENOUS

## 2015-01-13 MED ORDER — ACETAMINOPHEN 325 MG PO TABS
650.0000 mg | ORAL_TABLET | Freq: Four times a day (QID) | ORAL | Status: DC | PRN
  Filled 2015-01-13: qty 2

## 2015-01-13 MED ORDER — OXYCODONE HCL 5 MG/5ML PO SOLN: 5.0000 mg | Freq: Once | ORAL | Status: AC | PRN

## 2015-01-13 MED ORDER — ONDANSETRON HCL 4 MG PO TABS: 4.0000 mg | ORAL_TABLET | Freq: Four times a day (QID) | ORAL | Status: DC | PRN

## 2015-01-13 MED ORDER — METOCLOPRAMIDE HCL 5 MG PO TABS
5.0000 mg | ORAL_TABLET | Freq: Three times a day (TID) | ORAL | Status: DC | PRN
  Filled 2015-01-13: qty 2

## 2015-01-13 MED ORDER — ACETAMINOPHEN 325 MG PO TABS: 650.0000 mg | ORAL_TABLET | Freq: Four times a day (QID) | ORAL | Status: DC | PRN

## 2015-01-13 MED ORDER — SERTRALINE HCL 50 MG PO TABS
50.0000 mg | ORAL_TABLET | Freq: Every day | ORAL | Status: DC
  Administered 2015-01-13 – 2015-01-22 (×9): 50 mg via ORAL
  Filled 2015-01-13: qty 2
  Filled 2015-01-13 (×10): qty 1

## 2015-01-13 MED ORDER — ALLOPURINOL 300 MG PO TABS
300.0000 mg | ORAL_TABLET | Freq: Two times a day (BID) | ORAL | Status: DC
  Administered 2015-01-13 – 2015-01-25 (×23): 300 mg via ORAL
  Filled 2015-01-13 (×25): qty 1

## 2015-01-13 MED ORDER — SODIUM CHLORIDE 0.9 % IV SOLN
INTRAVENOUS | Status: DC
  Administered 2015-01-13: 18:00:00 via INTRAVENOUS

## 2015-01-13 MED ORDER — INSULIN ASPART 100 UNIT/ML ~~LOC~~ SOLN
0.0000 [IU] | Freq: Three times a day (TID) | SUBCUTANEOUS | Status: DC
  Administered 2015-01-13 – 2015-01-15 (×2): 2 [IU] via SUBCUTANEOUS
  Administered 2015-01-15: 5 [IU] via SUBCUTANEOUS
  Administered 2015-01-15: 3 [IU] via SUBCUTANEOUS
  Administered 2015-01-16: 5 [IU] via SUBCUTANEOUS
  Administered 2015-01-16: 3 [IU] via SUBCUTANEOUS
  Administered 2015-01-16: 1 [IU] via SUBCUTANEOUS
  Administered 2015-01-17: 3 [IU] via SUBCUTANEOUS
  Administered 2015-01-17: 1 [IU] via SUBCUTANEOUS
  Administered 2015-01-17 – 2015-01-18 (×2): 2 [IU] via SUBCUTANEOUS

## 2015-01-13 MED ORDER — DOCUSATE SODIUM 100 MG PO CAPS: 100.0000 mg | ORAL_CAPSULE | Freq: Two times a day (BID) | ORAL | Status: DC | PRN

## 2015-01-13 MED ORDER — HYDROMORPHONE HCL 1 MG/ML IJ SOLN
INTRAMUSCULAR | Status: AC
  Filled 2015-01-13: qty 1

## 2015-01-13 MED ORDER — ONDANSETRON HCL 4 MG/2ML IJ SOLN
4.0000 mg | Freq: Four times a day (QID) | INTRAMUSCULAR | Status: DC | PRN
  Administered 2015-01-16 – 2015-01-18 (×3): 4 mg via INTRAVENOUS
  Filled 2015-01-13 (×3): qty 2

## 2015-01-13 MED ORDER — PROPOFOL 10 MG/ML IV BOLUS
INTRAVENOUS | Status: DC | PRN
  Administered 2015-01-13: 160 mg via INTRAVENOUS

## 2015-01-13 MED ORDER — HYDROMORPHONE HCL 1 MG/ML IJ SOLN
0.2500 mg | INTRAMUSCULAR | Status: DC | PRN
  Administered 2015-01-13 (×2): 0.5 mg via INTRAVENOUS

## 2015-01-13 MED ORDER — TACROLIMUS 1 MG PO CAPS
5.0000 mg | ORAL_CAPSULE | Freq: Two times a day (BID) | ORAL | Status: DC
  Administered 2015-01-13 – 2015-01-25 (×23): 5 mg via ORAL
  Filled 2015-01-13 (×30): qty 5

## 2015-01-13 MED ORDER — OXYCODONE HCL 5 MG PO TABS
5.0000 mg | ORAL_TABLET | ORAL | Status: DC | PRN
  Administered 2015-01-13 (×2): 5 mg via ORAL
  Filled 2015-01-13 (×3): qty 1

## 2015-01-13 MED ORDER — METOCLOPRAMIDE HCL 5 MG/ML IJ SOLN
5.0000 mg | Freq: Three times a day (TID) | INTRAMUSCULAR | Status: DC | PRN
  Filled 2015-01-13: qty 2

## 2015-01-13 MED ORDER — ALUM & MAG HYDROXIDE-SIMETH 200-200-20 MG/5ML PO SUSP: 30.0000 mL | Freq: Four times a day (QID) | ORAL | Status: DC | PRN

## 2015-01-13 MED ORDER — PROPOFOL 10 MG/ML IV BOLUS
INTRAVENOUS | Status: AC
  Filled 2015-01-13: qty 20

## 2015-01-13 MED ORDER — OXYCODONE HCL 5 MG PO TABS
5.0000 mg | ORAL_TABLET | ORAL | Status: DC | PRN
  Administered 2015-01-13: 5 mg via ORAL
  Administered 2015-01-14: 10 mg via ORAL
  Administered 2015-01-14: 5 mg via ORAL
  Administered 2015-01-15 – 2015-01-22 (×9): 10 mg via ORAL
  Administered 2015-01-22: 5 mg via ORAL
  Administered 2015-01-23 – 2015-01-25 (×6): 10 mg via ORAL
  Filled 2015-01-13 (×18): qty 2

## 2015-01-13 MED ORDER — ONDANSETRON HCL 4 MG/2ML IJ SOLN
INTRAMUSCULAR | Status: AC
  Filled 2015-01-13: qty 2

## 2015-01-13 MED ORDER — HYDROMORPHONE HCL 1 MG/ML IJ SOLN
0.5000 mg | INTRAMUSCULAR | Status: DC | PRN
  Administered 2015-01-13 – 2015-01-23 (×3): 1 mg via INTRAVENOUS
  Filled 2015-01-13 (×3): qty 1

## 2015-01-13 MED ORDER — LIDOCAINE HCL (CARDIAC) 20 MG/ML IV SOLN
INTRAVENOUS | Status: AC
  Filled 2015-01-13: qty 5

## 2015-01-13 MED ORDER — PROMETHAZINE HCL 25 MG/ML IJ SOLN: 6.2500 mg | INTRAMUSCULAR | Status: DC | PRN

## 2015-01-13 MED ORDER — MYCOPHENOLATE MOFETIL 250 MG PO CAPS
1000.0000 mg | ORAL_CAPSULE | Freq: Two times a day (BID) | ORAL | Status: DC
  Administered 2015-01-13 – 2015-01-25 (×23): 1000 mg via ORAL
  Filled 2015-01-13 (×27): qty 4

## 2015-01-13 MED ORDER — FENTANYL CITRATE (PF) 250 MCG/5ML IJ SOLN
INTRAMUSCULAR | Status: AC
  Filled 2015-01-13: qty 5

## 2015-01-13 MED ORDER — PRAZOSIN HCL 5 MG PO CAPS
6.0000 mg | ORAL_CAPSULE | Freq: Every day | ORAL | Status: DC
  Administered 2015-01-13 – 2015-01-24 (×11): 6 mg via ORAL
  Filled 2015-01-13 (×14): qty 1

## 2015-01-13 MED ORDER — LIDOCAINE HCL (CARDIAC) 20 MG/ML IV SOLN
INTRAVENOUS | Status: DC | PRN
  Administered 2015-01-13: 60 mg via INTRAVENOUS

## 2015-01-13 MED ORDER — AMLODIPINE BESYLATE 10 MG PO TABS
10.0000 mg | ORAL_TABLET | Freq: Every morning | ORAL | Status: DC
  Administered 2015-01-13 – 2015-01-25 (×12): 10 mg via ORAL
  Filled 2015-01-13 (×13): qty 1

## 2015-01-13 MED ORDER — METHOCARBAMOL 500 MG PO TABS
500.0000 mg | ORAL_TABLET | Freq: Four times a day (QID) | ORAL | Status: DC | PRN
  Administered 2015-01-13 – 2015-01-23 (×5): 500 mg via ORAL
  Filled 2015-01-13 (×6): qty 1

## 2015-01-13 SURGICAL SUPPLY — 7 items
BNDG COHESIVE 4X5 TAN STRL (GAUZE/BANDAGES/DRESSINGS) ×1 IMPLANT
BNDG GAUZE ELAST 4 BULKY (GAUZE/BANDAGES/DRESSINGS) ×1 IMPLANT
GAUZE XEROFORM 1X8 LF (GAUZE/BANDAGES/DRESSINGS) ×1 IMPLANT
HANDPIECE INTERPULSE COAX TIP (DISPOSABLE) ×2
SET HNDPC FAN SPRY TIP SCT (DISPOSABLE) IMPLANT
SPONGE GAUZE 4X4 12PLY STER LF (GAUZE/BANDAGES/DRESSINGS) ×1 IMPLANT
SUT ETHILON 3 0 PS 1 (SUTURE) ×2 IMPLANT

## 2015-01-13 NOTE — Consult Note (Signed)
Reason for Consult:Left foot ulceration Referring Physician: [name], [name] is an 64 y.o. male.  HPI:64 year old male with DM, renal disorder and sleep apnea. Reports 1 week of left great toe pain , swelling and drainage .  Notes he filed his callus down over his left great toe and soon thereafter developed pain in area. Denies fevers or chills. No treatment for left toe ulceration prior to admission.   Past Medical History  Diagnosis Date  . Renal disorder   . Diabetes mellitus without complication   . CPAP (continuous positive airway pressure) dependence   . Sleep apnea   . Legally blind in left eye, as defined in [place]   . PTSD (post-traumatic stress disorder)     Past Surgical History  Procedure Laterality Date  . Combined kidney-pancreas transplant Left 2009    History reviewed. No pertinent family history.  Social History:  reports that he has quit smoking. He has never used smokeless tobacco. He reports that he drinks alcohol. He reports that he does not use illicit drugs.  Allergies: No Known Allergies  Medications: I have reviewed the patient's current medications.  Results for orders placed or performed during the hospital encounter of 01/12/15 (from the past 48 hour(s))  Comprehensive metabolic panel     Status: Abnormal   Collection Time: 01/12/15 11:17 PM  Result Value Ref Range   Sodium 133 (L) 135 - 145 mmol/L   Potassium 4.5 3.5 - 5.1 mmol/L   Chloride 97 96 - 112 mmol/L   CO2 24 19 - 32 mmol/L   Glucose, Bld 308 (H) 70 - 99 mg/dL   BUN 13 6 - 23 mg/dL   Creatinine, Ser 1.01 0.50 - 1.35 mg/dL   Calcium 9.6 8.4 - 10.5 mg/dL   Total Protein 8.0 6.0 - 8.3 g/dL   Albumin 4.0 3.5 - 5.2 g/dL   AST 12 0 - 37 U/L   ALT 9 0 - 53 U/L   Alkaline Phosphatase 85 39 - 117 U/L   Total Bilirubin 0.9 0.3 - 1.2 mg/dL   GFR calc non Af Amer 78 (L) >90 mL/min   GFR calc Af Amer >90 >90 mL/min    Comment: (NOTE) The eGFR has been calculated using the CKD EPI  equation. This calculation has not been validated in all clinical situations. eGFR's persistently <90 mL/min signify possible Chronic Kidney Disease.    Anion gap 12 5 - 15  CBC with Differential     Status: Abnormal   Collection Time: 01/12/15 11:17 PM  Result Value Ref Range   WBC 12.2 (H) 4.0 - 10.5 K/uL   RBC 5.11 4.22 - 5.81 MIL/uL   Hemoglobin 13.5 13.0 - 17.0 g/dL   HCT 41.8 39.0 - 52.0 %   MCV 81.8 78.0 - 100.0 fL   MCH 26.4 26.0 - 34.0 pg   MCHC 32.3 30.0 - 36.0 g/dL   RDW 13.6 11.5 - 15.5 %   Platelets 192 150 - 400 K/uL   Neutrophils Relative % 80 (H) 43 - 77 %   Neutro Abs 9.8 (H) 1.7 - 7.7 K/uL   Lymphocytes Relative 12 12 - 46 %   Lymphs Abs 1.4 0.7 - 4.0 K/uL   Monocytes Relative 7 3 - 12 %   Monocytes Absolute 0.9 0.1 - 1.0 K/uL   Eosinophils Relative 1 0 - 5 %   Eosinophils Absolute 0.1 0.0 - 0.7 K/uL   Basophils Relative 0 0 - 1 %  Basophils Absolute 0.0 0.0 - 0.1 K/uL  Glucose, capillary     Status: Abnormal   Collection Time: 01/13/15  2:24 AM  Result Value Ref Range   Glucose-Capillary 274 (H) 70 - 99 mg/dL  Basic metabolic panel     Status: Abnormal   Collection Time: 01/13/15  5:03 AM  Result Value Ref Range   Sodium 135 135 - 145 mmol/L   Potassium 4.0 3.5 - 5.1 mmol/L   Chloride 100 96 - 112 mmol/L   CO2 23 19 - 32 mmol/L   Glucose, Bld 230 (H) 70 - 99 mg/dL   BUN 12 6 - 23 mg/dL   Creatinine, Ser 0.79 0.50 - 1.35 mg/dL   Calcium 9.0 8.4 - 10.5 mg/dL   GFR calc non Af Amer >90 >90 mL/min   GFR calc Af Amer >90 >90 mL/min    Comment: (NOTE) The eGFR has been calculated using the CKD EPI equation. This calculation has not been validated in all clinical situations. eGFR's persistently <90 mL/min signify possible Chronic Kidney Disease.    Anion gap 12 5 - 15  CBC     Status: Abnormal   Collection Time: 01/13/15  5:03 AM  Result Value Ref Range   WBC 10.3 4.0 - 10.5 K/uL   RBC 4.61 4.22 - 5.81 MIL/uL   Hemoglobin 12.0 (L) 13.0 - 17.0 g/dL    HCT 37.3 (L) 39.0 - 52.0 %   MCV 80.9 78.0 - 100.0 fL   MCH 26.0 26.0 - 34.0 pg   MCHC 32.2 30.0 - 36.0 g/dL   RDW 13.7 11.5 - 15.5 %   Platelets 167 150 - 400 K/uL    Dg Foot 2 Views Left  01/13/2015   CLINICAL DATA:  Left foot pain and infection for 1 week.  Diabetes  EXAM: LEFT FOOT - 2 VIEW  COMPARISON:  None.  FINDINGS: Extensive soft tissue gas in the medial forefoot, around the first MTP joint. Gas overlaps the medial joint, and may reach the medial sesamoid. There is no definitive osteomyelitis or septic arthritis. No dissecting subcutaneous gas.  No acute fracture dislocation. Diffuse arterial calcification consistent with a diabetes history.  IMPRESSION: Soft tissue gas around the first MTP joint consistent with ulceration or necrotizing infection. Although there is no definitive osteomyelitis, there is very close proximity of bone and gas.   Electronically Signed   By: Monte Fantasia M.D.   On: 01/13/2015 00:14    Review of Systems  Constitutional: Negative for fever and chills.  Respiratory: Negative.   Cardiovascular: Negative for chest pain and palpitations.  Gastrointestinal: Positive for heartburn.  Skin:       Left great toe draining wound   Blood pressure 148/65, pulse 80, temperature 98.8 F (37.1 C), temperature source Oral, resp. rate 17, height $RemoveBe'5\' 10"'KulDMrdpm$  (1.778 m), weight 82.781 kg (182 lb 8 oz), SpO2 98 %. Physical Exam  Constitutional: He is oriented to person, place, and time. He appears well-developed and well-nourished. No distress.  HENT:  Head: Normocephalic and atraumatic.  Cardiovascular: Normal rate and intact distal pulses.   Trace DP pulses bilateral.  Respiratory: Effort normal.  Musculoskeletal:  Left great toe medial MTP joint area ulceration. No active drainage, erythema or warmth. Pain with ROM of great toe. Remaining foot no erythema or skin breakdown. Bilateral calves supple non tender.  Neurological: He is alert and oriented to person, place,  and time.  Sensation grossly intact bilateral  Skin: Skin is warm and dry.  Psychiatric: He has a normal mood and affect.    Assessment/Plan: Left great toe bony eminence area  Ulceration. Would benefit from irrigation and debridement.  Continue antibiotics NPO for surgical procedure later today. Patient does not appear septic at this time will proceed with I&D later today.  Erskine Emery 01/13/2015, 7:47 AM

## 2015-01-13 NOTE — Progress Notes (Signed)
Dr. Magnus IvanBlackman called. Pt was suppose to go to surgery today. Made NPO at 0730. Pt had been eating since admission. Explained to pt. He stated that he was not told he was going to have surgery today.

## 2015-01-13 NOTE — Op Note (Signed)
NAME:  [name], [name]              ACCOUNT NO.:  [id number]  MEDICAL RECORD NO.:  [id number]  LOCATION:  [id number]                        FACILITY:  [facility]  PHYSICIAN:  Vanita PandaChristopher Y. Magnus IvanBlackman, M.D.DATE OF BIRTH:  1950/11/21  DATE OF PROCEDURE:  01/13/2015 DATE OF DISCHARGE:                              OPERATIVE REPORT   PREOPERATIVE DIAGNOSES:  Left foot abscess and infection under the first ray at the metatarsophalangeal joint.  POSTOPERATIVE DIAGNOSES:  Left foot abscess and infection under the first ray at the metatarsophalangeal joint.  PROCEDURE:  Irrigation and excisional debridement of left foot abscess on the plantar aspect of the foot under the first ray with sharp excision of necrotic skin, fascia and bone.  FINDINGS:  Large area of gross purulence into the first MTP joint with obvious abscess.  Gram stain and cultures pending.  SURGEON:  Vanita PandaChristopher Y. Magnus IvanBlackman, MD  ANESTHESIA:  General.  ANTIBIOTICS:  Already on vancomycin and Zosyn IV.  BLOOD LOSS:  Less than 50 mL.  COMPLICATIONS:  None.  INDICATIONS:  [name] is a 64 year old diabetic, who was admitted early this morning from the emergency room to the regular floor bed due to an abscess on the dorsum of his left foot, Orthopedic Surgery was consulted.  The plain films did not show any bony destruction, but there was obviously air in the soft tissues from a small ulcer.  We recommended a thorough irrigation and debridement of this wound.  He understands this fully and understands the reason behind proceeding with the surgery.  PROCEDURE DESCRIPTION:  After informed consent was obtained, appropriate left foot was marked.  He was brought to the operating room and general anesthesia was obtained while he was on the operating table.  His left foot was prepped with Betadine.  Time-out was called and he was identified as correct patient and correct left foot.  He had a large soft tissue swelling on the plantar  aspect of his foot over the first MTP joint.  I made a large incision with a knife and there was abundant gross purulence encountered that was very malodorous.  I had ellipsed out the skin, ellipsed out a wide area of necrotic tissue sharply with a knife with excising infected skin, soft tissue fascia, and just minimal bone.  I do believe that the flexor tendon likely involved.  Once I was able to sharply debride all the necrotic tissue back to stable bleeding margin, I then used pulsatile lavage to completely irrigate out the wound with 3 L of normal saline solution.  I then placed Xeroform and a wet-to-dry dressing, then dry dressing.  After that, he was awakened and extubated and taken to recovery room in stable condition.  All final counts were correct.  There were no complications noted.  Postoperatively, he will need continued IV antibiotics with likely hydrotherapy and the need for further wound care and more surgery including the possibility of a first ray amputation to obtain wound coverage.     Vanita Pandahristopher Y. Magnus IvanBlackman, M.D.     CYB/MEDQ  D:  01/13/2015  T:  01/13/2015  Job:  [id number]

## 2015-01-13 NOTE — Anesthesia Procedure Notes (Signed)
Procedure Name: LMA Insertion Date/Time: 01/13/2015 4:22 PM Performed by: Alanda AmassFRIEDMAN, Thuy Atilano A Pre-anesthesia Checklist: Patient identified, Emergency Drugs available, Suction available, Patient being monitored and Timeout performed Patient Re-evaluated:Patient Re-evaluated prior to inductionOxygen Delivery Method: Circle system utilized Preoxygenation: Pre-oxygenation with 100% oxygen Intubation Type: IV induction Ventilation: Mask ventilation without difficulty LMA: LMA inserted LMA Size: 4.0 Number of attempts: 1 Placement Confirmation: positive ETCO2 and breath sounds checked- equal and bilateral Tube secured with: Tape Dental Injury: Teeth and Oropharynx as per pre-operative assessment

## 2015-01-13 NOTE — Progress Notes (Signed)
Patient seen and examined Left great toe bony eminence area Ulceration. Would benefit from irrigation and debridement.  Continue antibiotics NPO for surgical procedure later today. CBG elevated today at 228,, will adjust insulin after surgery, currently patient nothing by mouth Continue Zosyn and vancomycin

## 2015-01-13 NOTE — Brief Op Note (Signed)
01/12/2015 - 01/13/2015  4:51 PM  PATIENT:  [name]  64 y.o. male  PRE-OPERATIVE DIAGNOSIS:  left foot ulcer  POST-OPERATIVE DIAGNOSIS:  left foot ulcer with abscess  PROCEDURE:  Procedure(s): INCISION AND DRAINAGE OF LEFT FOOT FIRST RAY ABCESS (Left)  SURGEON:  Surgeon(s) and Role:    * Kathryne Hitchhristopher Y Jalene Lacko, MD - Primary  ANESTHESIA:   general  EBL:  Total I/O In: 300 [I.V.:300] Out: 5 [Blood:5]  BLOOD ADMINISTERED:none  DRAINS: none   LOCAL MEDICATIONS USED:  NONE  SPECIMEN:  No Specimen  DISPOSITION OF SPECIMEN:  N/A  COUNTS:  YES  TOURNIQUET:  * No tourniquets in log *  DICTATION: .Other Dictation: Dictation Number [id number]  PLAN OF CARE: Admit to inpatient   PATIENT DISPOSITION:  PACU - hemodynamically stable.   Delay start of Pharmacological VTE agent (>24hrs) due to surgical blood loss or risk of bleeding: no

## 2015-01-13 NOTE — Transfer of Care (Signed)
Immediate Anesthesia Transfer of Care Note  Patient: [name]  Procedure(s) Performed: Procedure(s): INCISION AND DRAINAGE OF LEFT FOOT FIRST RAY ABCESS (Left)  Patient Location: PACU  Anesthesia Type:General  Level of Consciousness: awake  Airway & Oxygen Therapy: Patient Spontanous Breathing and Patient connected to nasal cannula oxygen  Post-op Assessment: Report given to RN and Post -op Vital signs reviewed and stable  Post vital signs: Reviewed and stable  Last Vitals:  Filed Vitals:   01/13/15 1500  BP: 142/54  Pulse: 78  Temp: 37 C  Resp: 18    Complications: No apparent anesthesia complications

## 2015-01-13 NOTE — Progress Notes (Signed)
ANTIBIOTIC CONSULT NOTE - INITIAL  Pharmacy Consult for Vancomycin/Zosyn Indication: Cellulitis  No Known Allergies  Patient Measurements: Height: 5\' 10"  (177.8 cm) Weight: 182 lb 8 oz (82.781 kg) IBW/kg (Calculated) : 73  Vital Signs: Temp: 98.3 F (36.8 C) (04/17 0237) Temp Source: Oral (04/17 0237) BP: 157/71 mmHg (04/17 0237) Pulse Rate: 86 (04/17 0237)  Labs:  Recent Labs  01/12/15 2317  WBC 12.2*  HGB 13.5  PLT 192  CREATININE 1.01   Estimated Creatinine Clearance: 79.3 mL/min (by C-G formula based on Cr of 1.01).  Medical History: Past Medical History  Diagnosis Date  . Renal disorder   . Diabetes mellitus without complication   . CPAP (continuous positive airway pressure) dependence   . Sleep apnea   . Legally blind in left eye, as defined in [place]   . PTSD (post-traumatic stress disorder)    Assessment: Diabetic with LLE cellulitis, imaging with no definitive osteo, but possible necrotizing infection.  WBC mildly elevated, renal function good, other labs as above.   Goal of Therapy:  Vancomycin trough level 15-20 mcg/ml, for now  Plan:  -Vancomycin 1000 mg IV q12h -Zosyn 3.375G IV q8h to be infused over 4 hours -Trend WBC, temp, renal function  -Drug levels as indicated   Abran DukeLedford, Kesleigh Morson 01/13/2015,2:57 AM

## 2015-01-13 NOTE — H&P (Addendum)
[facility] Admission History and Physical       [name] WUJ:[id number] DOB: Jul 30, 1951 DOA: 01/12/2015  Referring physician: EDP PCP: [facility] Specialists:   Chief Complaint:  Pain Redness and Swelling of Left Foot  HPI: [name] is a 64 y.o. male with a history of a Pancreatic and Renal Transplant, DM2, HTN, and CKD who presents to the ED with complaints of increased pain redness and swelling of his left foot x 1 week.  He reports that he was trimming a callous on his left foot and must have cut to deeply because he reports his symptoms began the next day.   He denies any fevers and chills.  He was found to have evidence of inflammation but no signs of Osteomyeltis on Plain X-rays.   Orthopedics was consulted to see in the AM, and IV Vancomycin and Zosyn were started.      Review of Systems:  Constitutional: No Weight Loss, No Weight Gain, Night Sweats, Fevers, Chills, Dizziness, Light Headedness, Fatigue, or Generalized Weakness HEENT: No Headaches, Difficulty Swallowing,Tooth/Dental Problems,Sore Throat,  No Sneezing, Rhinitis, Ear Ache, Nasal Congestion, or Post Nasal Drip,  Cardio-vascular:  No Chest pain, Orthopnea, PND, Edema in Lower Extremities, Anasarca, Dizziness, Palpitations  Resp: No Dyspnea, No DOE, No Productive Cough, No Non-Productive Cough, No Hemoptysis, No Wheezing.    GI: No Heartburn, Indigestion, Abdominal Pain, Nausea, Vomiting, Diarrhea, Constipation, Hematemesis, Hematochezia, Melena, Change in Bowel Habits,  Loss of Appetite  GU: No Dysuria, No Change in Color of Urine, No Urgency or Urinary Frequency, No Flank pain.  Musculoskeletal: +Left Foot Pain and Swelling, No Decreased Range of Motion, No Back Pain.  Neurologic: No Syncope, No Seizures, Muscle Weakness, Paresthesia, Vision Disturbance or Loss, No Diplopia, No Vertigo, No Difficulty Walking,  Skin: No Rash or Lesions. Psych: No Change in Mood or Affect, No Depression or Anxiety,  No Memory loss, No Confusion, or Hallucinations   Past Medical History  Diagnosis Date  . Renal disorder   . Diabetes mellitus without complication   . CPAP (continuous positive airway pressure) dependence   . Sleep apnea   . Legally blind in left eye, as defined in [place]   . PTSD (post-traumatic stress disorder)      Past Surgical History  Procedure Laterality Date  . Combined kidney-pancreas transplant Left 2009      Prior to Admission medications   Medication Sig Start Date End Date Taking? Authorizing Provider  allopurinol (ZYLOPRIM) 300 MG tablet Take 300 mg by mouth 2 (two) times daily.    Yes Historical Provider, MD  amLODipine (NORVASC) 10 MG tablet Take 10 mg by mouth every morning.   Yes Historical Provider, MD  Chlorpheniramine-DM (CORICIDIN HBP COUGH/COLD PO) Take 1 tablet by mouth 2 (two) times daily as needed (cough).   Yes Historical Provider, MD  docusate sodium (COLACE) 100 MG capsule Take 100 mg by mouth 2 (two) times daily as needed for mild constipation.    Yes Historical Provider, MD  glipiZIDE (GLUCOTROL) 10 MG tablet Take 10 mg by mouth daily before breakfast.   Yes Historical Provider, MD  guaiFENesin-dextromethorphan (ROBITUSSIN DM) 100-10 MG/5ML syrup Take 5 mLs by mouth every 4 (four) hours as needed for cough.   Yes Historical Provider, MD  mirtazapine (REMERON) 15 MG tablet Take 7.5 mg by mouth at bedtime as needed (insomnia).   Yes Historical Provider, MD  mycophenolate (CELLCEPT) 250 MG capsule Take 1,000 mg by mouth 2 (two) times daily.   Yes  Historical Provider, MD  prazosin (MINIPRESS) 2 MG capsule Take 6 mg by mouth at bedtime.   Yes Historical Provider, MD  sertraline (ZOLOFT) 100 MG tablet Take 50 mg by mouth at bedtime.   Yes Historical Provider, MD  simvastatin (ZOCOR) 40 MG tablet Take 20 mg by mouth at bedtime.   Yes Historical Provider, MD  tacrolimus (PROGRAF) 1 MG capsule Take 5 mg by mouth 2 (two) times daily.    Yes Historical Provider, MD       No Known Allergies  Social History:  reports that he has quit smoking. He has never used smokeless tobacco. He reports that he drinks alcohol. He reports that he does not use illicit drugs.    History reviewed. No pertinent family history.     Physical Exam:  GEN:  Pleasant Well Nourished and well Developed 64 y.o. African American male examined and in no acute distress; cooperative with exam Filed Vitals:   01/12/15 2330 01/13/15 0015 01/13/15 0100 01/13/15 0152  BP: 169/70   167/66  Pulse: 88 84 77 82  Temp:      TempSrc:      Resp:    14  SpO2: 96% 97% 98% 97%   Blood pressure 167/66, pulse 82, temperature 98.2 F (36.8 C), temperature source Oral, resp. rate 14, SpO2 97 %. PSYCH: He is alert and oriented x4; does not appear anxious does not appear depressed; affect is normal HEENT: Normocephalic and Atraumatic, Mucous membranes pink; PERRLA; EOM intact; Fundi:  Benign;  No scleral icterus, Nares: Patent, Oropharynx: Clear, Fair Dentition,    Neck:  FROM, No Cervical Lymphadenopathy nor Thyromegaly or Carotid Bruit; No JVD; Breasts:: Not examined CHEST WALL: No tenderness CHEST: Normal respiration, clear to auscultation bilaterally HEART: Regular rate and rhythm; no murmurs rubs or gallops BACK: No kyphosis or scoliosis; No CVA tenderness ABDOMEN: Positive Bowel Sounds, Soft Non-Tender, No Rebound or Guarding; No Masses, No Organomegaly,. Rectal Exam: Not done EXTREMITIES: No Cyanosis, Clubbing, or Edema; +Edema of Distal left Foot and Ulcer on plantar Surface of Left 1st MTP Base.    Genitalia: not examined PULSES: 2+ and symmetric SKIN: Normal hydration no rash or ulceration CNS:  Alert and Oriented x 4, No Focal Deficits except for visual impairment/ Legal Blindness  Vascular: pulses palpable throughout    Labs on Admission:  Basic Metabolic Panel:  Recent Labs Lab 01/12/15 2317  NA 133*  K 4.5  CL 97  CO2 24  GLUCOSE 308*  BUN 13  CREATININE 1.01   CALCIUM 9.6   Liver Function Tests:  Recent Labs Lab 01/12/15 2317  AST 12  ALT 9  ALKPHOS 85  BILITOT 0.9  PROT 8.0  ALBUMIN 4.0   No results for input(s): LIPASE, AMYLASE in the last 168 hours. No results for input(s): AMMONIA in the last 168 hours. CBC:  Recent Labs Lab 01/12/15 2317  WBC 12.2*  NEUTROABS 9.8*  HGB 13.5  HCT 41.8  MCV 81.8  PLT 192   Cardiac Enzymes: No results for input(s): CKTOTAL, CKMB, CKMBINDEX, TROPONINI in the last 168 hours.  BNP (last 3 results) No results for input(s): BNP in the last 8760 hours.  ProBNP (last 3 results) No results for input(s): PROBNP in the last 8760 hours.  CBG: No results for input(s): GLUCAP in the last 168 hours.  Radiological Exams on Admission: Dg Foot 2 Views Left  01/13/2015   CLINICAL DATA:  Left foot pain and infection for 1 week.  Diabetes  EXAM: LEFT FOOT - 2 VIEW  COMPARISON:  None.  FINDINGS: Extensive soft tissue gas in the medial forefoot, around the first MTP joint. Gas overlaps the medial joint, and may reach the medial sesamoid. There is no definitive osteomyelitis or septic arthritis. No dissecting subcutaneous gas.  No acute fracture dislocation. Diffuse arterial calcification consistent with a diabetes history.  IMPRESSION: Soft tissue gas around the first MTP joint consistent with ulceration or necrotizing infection. Although there is no definitive osteomyelitis, there is very close proximity of bone and gas.   Electronically Signed   By: Marnee Spring M.D.   On: 01/13/2015 00:14      Assessment/Plan:   64 y.o. male with  Principal Problem:   1.   Cellulitis and abscess of foot excluding toe/ Cellulitis and abscess of foot   IV Vancomycin and Zosyn   Orthopedics Unassigned for [employer] and will see in the AM   Active Problems:   2.   Essential hypertension   Continue Prazosin, and Amlodipine Rx   Monitor BPs     3.   Diabetes mellitus without complication   Continue  Glipizide Rx   Add SSI Coverage PRN   CHO Modified Medium Heart Healthy Diet       4.   Leukocytosis- due to #1   Monitor Trend      5.   Hx of Pancreatic and Renal Transplant-    Continue Tacrolimus    Continue Cellcept Rx     6.   DVT Prophylaxis   SQ Heparin         Code Status:     FULL CODE       Family Communication:   Family at Bedside   Disposition Plan:    Inpatient Status        Time spent:  74 Minutes      Ron Parker [facility] Pager [phone]   If 7AM -7PM Please Contact the Day Rounding Team MD for [facility]  If 7PM-7AM, Please Contact Night-Floor Coverage  [email or web address] Password [email or web address] 01/13/2015, 2:19 AM     ADDENDUM:   Patient was seen and examined on 01/13/2015

## 2015-01-13 NOTE — Anesthesia Postprocedure Evaluation (Signed)
  Anesthesia Post-op Note  Patient: [name]  Procedure(s) Performed: Procedure(s): INCISION AND DRAINAGE OF LEFT FOOT FIRST RAY ABCESS (Left)  Patient Location: PACU  Anesthesia Type:General  Level of Consciousness: awake and alert   Airway and Oxygen Therapy: Patient Spontanous Breathing  Post-op Pain: mild  Post-op Assessment: Post-op Vital signs reviewed  Post-op Vital Signs: Reviewed  Last Vitals:  Filed Vitals:   01/13/15 1817  BP: 145/65  Pulse: 85  Temp: 37 C  Resp: 22    Complications: No apparent anesthesia complications

## 2015-01-13 NOTE — Anesthesia Preprocedure Evaluation (Addendum)
Anesthesia Evaluation  Patient identified by MRN, date of birth, ID band Patient awake    Reviewed: Allergy & Precautions, NPO status , Patient's Chart, lab work & pertinent test results  Airway Mallampati: III  TM Distance: >3 FB Neck ROM: Full    Dental   Pulmonary sleep apnea , former smoker,  breath sounds clear to auscultation        Cardiovascular hypertension, Pt. on medications + Peripheral Vascular Disease Rhythm:Regular Rate:Normal     Neuro/Psych Anxiety negative neurological ROS     GI/Hepatic negative GI ROS, Neg liver ROS,   Endo/Other  diabetes, Type 2, Oral Hypoglycemic Agents  Renal/GU Kidney-pancreas transplant recipient     Musculoskeletal   Abdominal   Peds  Hematology  (+) anemia , Hgb 10.0   Anesthesia Other Findings   Reproductive/Obstetrics                           Anesthesia Physical Anesthesia Plan  ASA: III  Anesthesia Plan: General   Post-op Pain Management:    Induction: Intravenous  Airway Management Planned: LMA  Additional Equipment:   Intra-op Plan:   Post-operative Plan: Extubation in OR  Informed Consent: I have reviewed the patients History and Physical, chart, labs and discussed the procedure including the risks, benefits and alternatives for the proposed anesthesia with the patient or authorized representative who has indicated his/her understanding and acceptance.   Dental advisory given  Plan Discussed with: CRNA  Anesthesia Plan Comments:        Anesthesia Quick Evaluation

## 2015-01-14 ENCOUNTER — Encounter (HOSPITAL_COMMUNITY): Payer: Self-pay | Admitting: Orthopaedic Surgery

## 2015-01-14 LAB — GLUCOSE, CAPILLARY
Glucose-Capillary: 157 mg/dL — ABNORMAL HIGH (ref 70–99)
Glucose-Capillary: 243 mg/dL — ABNORMAL HIGH (ref 70–99)
Glucose-Capillary: 288 mg/dL — ABNORMAL HIGH (ref 70–99)
Glucose-Capillary: 255 mg/dL — ABNORMAL HIGH (ref 70–99)
Glucose-Capillary: 218 mg/dL — ABNORMAL HIGH (ref 70–99)

## 2015-01-14 LAB — HEMOGLOBIN A1C
Hgb A1c MFr Bld: 11.8 % — ABNORMAL HIGH (ref 4.8–5.6)
Mean Plasma Glucose: 292 mg/dL

## 2015-01-14 MED ORDER — PANTOPRAZOLE SODIUM 40 MG PO TBEC
40.0000 mg | DELAYED_RELEASE_TABLET | Freq: Every day | ORAL | Status: DC
  Administered 2015-01-14 – 2015-01-25 (×10): 40 mg via ORAL
  Filled 2015-01-14 (×11): qty 1

## 2015-01-14 MED ORDER — INSULIN GLARGINE 100 UNIT/ML ~~LOC~~ SOLN
25.0000 [IU] | Freq: Once | SUBCUTANEOUS | Status: DC
  Filled 2015-01-14: qty 0.25

## 2015-01-14 NOTE — Progress Notes (Signed)
Physical Therapy Wound Treatment Patient Details  Name: [name] MRN: [id number] Date of Birth: [birth year]  Today's Date: 01/14/2015 Time: 6767-2094 Time Calculation (min): 28 min  Subjective  Subjective: Pt states this started last Friday. Patient and Family Stated Goals: Get foot better. Date of Onset: 01/04/15 Prior Treatments: surgical I&D  Pain Score: Pain Score: Asleep  Wound Assessment  Wound / Incision (Open or Dehisced) 01/13/15 Puncture Foot Posterior swelling below left great toe/small red area (Active)  Dressing Type Compression wrap;Gauze (Comment) 01/14/2015  8:58 AM  Dressing Changed Changed 01/14/2015  8:58 AM  Dressing Status Clean;Dry;Intact 01/14/2015  8:58 AM  Dressing Change Frequency Daily 01/14/2015  8:58 AM  Site / Wound Assessment Red;Pink;Yellow 01/14/2015  8:58 AM  % Wound base Red or Granulating 70% 01/14/2015  8:58 AM  % Wound base Yellow 5% 01/14/2015  8:58 AM  % Wound base Black 0% 01/14/2015  8:58 AM  % Wound base Other (Comment) 25% 01/14/2015  8:58 AM  Peri-wound Assessment Erythema (blanchable);Edema 01/14/2015  8:58 AM  Wound Length (cm) 6 cm 01/14/2015  8:58 AM  Wound Width (cm) 4.5 cm 01/14/2015  8:58 AM  Wound Depth (cm) 0.5 cm 01/14/2015  8:58 AM  Margins Unattached edges (unapproximated) 01/14/2015  8:58 AM  Closure None 01/14/2015  8:58 AM  Drainage Amount Moderate 01/14/2015  8:58 AM  Drainage Description Serosanguineous 01/14/2015  8:58 AM  Treatment Hydrotherapy (Pulse lavage);Packing (Saline gauze) 01/14/2015  8:58 AM   Hydrotherapy Pulsed lavage therapy - wound location: lt 1st metatarsal head Pulsed Lavage with Suction (psi): 4 psi Pulsed Lavage with Suction - Normal Saline Used: 1000 mL Pulsed Lavage Tip: Tip with splash shield   Wound Assessment and Plan  Wound Therapy - Assess/Plan/Recommendations Wound Therapy - Clinical Statement: Pt presents to hydrotherapy with open lt foot wound and is s/p I&D. Can benefit from hydrotherapy to  cleanse wound and promote healing. Wound Therapy - Functional Problem List: Decr mobility Factors Delaying/Impairing Wound Healing: Diabetes Mellitus;Infection - systemic/local;Multiple medical problems;Polypharmacy Hydrotherapy Plan: Debridement;Dressing change;Patient/family education;Pulsatile lavage with suction Wound Therapy - Frequency: 6X / week Wound Therapy - Follow Up Recommendations: [place] Wound Plan: See above  Wound Therapy Goals- Improve the function of patient's integumentary system by progressing the wound(s) through the phases of wound healing (inflammation - proliferation - remodeling) by: Decrease Necrotic Tissue to: 0 Decrease Necrotic Tissue - Progress: Goal set today Improve Drainage Characteristics: Min Improve Drainage Characteristics - Progress: Goal set today  Goals will be updated until maximal potential achieved or discharge criteria met.  Discharge criteria: when goals achieved, discharge from hospital, MD decision/surgical intervention, no progress towards goals, refusal/missing three consecutive treatments without notification or medical reason.  GP     March Steyer 01/14/2015, 9:07 AM  Suanne Marker PT [phone]

## 2015-01-14 NOTE — Evaluation (Signed)
Physical Therapy Evaluation Patient Details Name: [name] MRN: [id number] DOB: [birth year] Today's Date: 01/14/2015   History of Present Illness  Pt is a 64 y/o M s/p irrigation and excisional debridement of L foot abscess on plantar aspect of foot under 1st ray.  Pt's PMH includes PTSD, legally blind in L eye, DM, renal disorder.  Clinical Impression  Pt admitted with above diagnosis. Pt currently with functional limitations due to the deficits listed below (see PT Problem List).  Pt ambulatory distance limited by fatigue in RLE 2/2 NWB status of LLE.  Will attempt to assess stairs at next session if appropriate. Pt with safe use of RW while ambulating in room today.  Pt will benefit from skilled PT to increase their independence and safety with mobility to allow discharge to the venue listed below.       Follow Up Recommendations Supervision for mobility/OOB;No PT follow up    Equipment Recommendations  Rolling walker with 5" wheels;3in1 (PT)    Recommendations for Other Services       Precautions / Restrictions Precautions Precautions: Fall Restrictions Weight Bearing Restrictions: Yes LLE Weight Bearing: Non weight bearing      Mobility  Bed Mobility Overal bed mobility: Modified Independent             General bed mobility comments: Cues for proper sequencing, use of rails and increased time  Transfers Overall transfer level: Needs assistance Equipment used: Rolling walker (2 wheeled) Transfers: Sit to/from Stand Sit to Stand: Min assist         General transfer comment: Assist to rising, cues for hand placement and to push from bed.  Pt w/ slight LOB upon standing but pt able to stabilize using RW  Ambulation/Gait Ambulation/Gait assistance: Min guard Ambulation Distance (Feet): 20 Feet Assistive device: Rolling walker (2 wheeled) Gait Pattern/deviations: Step-to pattern;Decreased stride length;Antalgic;Decreased weight shift to left (hop on RLE)    Gait velocity interpretation: Below normal speed for age/gender General Gait Details: Pt reports fatigue of RLE after ambulating 20 ft and requests to sit.  Pt navigates room well using RW in a safe manner w/ shorter steps to maintain balance.  Stairs            Wheelchair Mobility    Modified Rankin (Stroke Patients Only)       Balance Overall balance assessment: Needs assistance Sitting-balance support: Feet supported;Bilateral upper extremity supported Sitting balance-Leahy Scale: Good   Postural control: Posterior lean (some postural lean in sitting EOB w/o feet supported) Standing balance support: Bilateral upper extremity supported;During functional activity Standing balance-Leahy Scale: Fair                               Pertinent Vitals/Pain Pain Assessment: 0-10 Pain Score: 6  Pain Location: L foot Pain Descriptors / Indicators: Dull;Throbbing Pain Intervention(s): Limited activity within patient's tolerance;Monitored during session;Repositioned    Home Living Family/patient expects to be discharged to:: Private residence Living Arrangements: Spouse/significant other Available Help at Discharge: Family;Available PRN/intermittently (wife works) Type of Home: House Home Access: Stairs to enter Entrance Stairs-Rails: None Secretary/administrator of Steps: 2 Home Layout: One level (2 steps to get down to [employer], R rail) Home Equipment: Gil[place] Mor - single point      Prior Function Level of Independence: Independent with assistive device(s) (2/2 "I feel weak")               Hand Dominance   Dominant  Hand: Right    Extremity/Trunk Assessment               Lower Extremity Assessment: LLE deficits/detail;Generalized weakness   LLE Deficits / Details: as expected w/ L foot abscess  Cervical / Trunk Assessment: Normal  Communication   Communication: No difficulties  Cognition Arousal/Alertness: Awake/alert Behavior During Therapy: WFL  for tasks assessed/performed Overall Cognitive Status: Within Functional Limits for tasks assessed                      General Comments General comments (skin integrity, edema, etc.): Pt is legally blind in L eye and wears glasses for reading and all other activities.    Exercises Total Joint Exercises Ankle Circles/Pumps: AROM;Right;10 reps;Supine Hip ABduction/ADduction: AROM;Both;10 reps;Supine Straight Leg Raises: AROM;Both;10 reps;Supine      Assessment/Plan    PT Assessment Patient needs continued PT services  PT Diagnosis Difficulty walking;Abnormality of gait;Generalized weakness;Acute pain   PT Problem List Decreased strength;Decreased range of motion;Decreased activity tolerance;Decreased balance;Decreased mobility;Decreased coordination;Decreased knowledge of use of DME;Decreased safety awareness;Decreased knowledge of precautions;Pain;Decreased skin integrity  PT Treatment Interventions Stair training;Gait training;DME instruction;Functional mobility training;Therapeutic activities;Therapeutic exercise;Balance training;Neuromuscular re-education;Patient/family education;Modalities   PT Goals (Current goals can be found in the Care Plan section) Acute Rehab PT Goals Patient Stated Goal: none stated PT Goal Formulation: With patient Time For Goal Achievement: 01/21/15 Potential to Achieve Goals: Good    Frequency Min 5X/week   Barriers to discharge Inaccessible home environment;Decreased caregiver support 2 steps to enter w/o rails, assist intermittently    Co-evaluation               End of Session Equipment Utilized During Treatment: Gait belt Activity Tolerance: Patient tolerated treatment well;Patient limited by fatigue Patient left: in chair;with call bell/phone within reach Nurse Communication: Mobility status;Precautions;Weight bearing status (IV beeping "line occluded")         Time: 1011-1047 PT Time Calculation (min) (ACUTE ONLY): 36  min   Charges:   PT Evaluation $Initial PT Evaluation Tier I: 1 Procedure PT Treatments $Gait Training: 8-22 mins   PT G CodesMichail Jewels:       Ashley Parr PT, DPT [phone] Pager: [phone]  01/14/2015, 11:01 AM

## 2015-01-14 NOTE — Consult Note (Signed)
Reason for Consult: Ischemic ulcer left great toe status post irrigation and debridement for abscess Referring Physician: Dr. Mart Piggs [name] is an 64 y.o. male.  HPI: Patient is a 64 year old gentleman with poorly controlled diabetes who has an acute abscess left great toe MTP joint. Patient is status post renal transplant.  Past Medical History  Diagnosis Date  . Renal disorder   . Diabetes mellitus without complication   . CPAP (continuous positive airway pressure) dependence   . Sleep apnea   . Legally blind in left eye, as defined in [place]   . PTSD (post-traumatic stress disorder)     Past Surgical History  Procedure Laterality Date  . Combined kidney-pancreas transplant Left 2009  . Incision and drainage abscess Left 01/13/2015    Procedure: INCISION AND DRAINAGE OF LEFT FOOT FIRST RAY ABCESS;  Surgeon: Mcarthur Rossetti, MD;  Location: [place];  Service: Orthopedics;  Laterality: Left;    History reviewed. No pertinent family history.  Social History:  reports that he has quit smoking. He has never used smokeless tobacco. He reports that he drinks alcohol. He reports that he does not use illicit drugs.  Allergies: No Known Allergies  Medications: I have reviewed the patient's current medications.  Results for orders placed or performed during the hospital encounter of 01/12/15 (from the past 48 hour(s))  Comprehensive metabolic panel     Status: Abnormal   Collection Time: 01/12/15 11:17 PM  Result Value Ref Range   Sodium 133 (L) 135 - 145 mmol/L   Potassium 4.5 3.5 - 5.1 mmol/L   Chloride 97 96 - 112 mmol/L   CO2 24 19 - 32 mmol/L   Glucose, Bld 308 (H) 70 - 99 mg/dL   BUN 13 6 - 23 mg/dL   Creatinine, Ser 1.01 0.50 - 1.35 mg/dL   Calcium 9.6 8.4 - 10.5 mg/dL   Total Protein 8.0 6.0 - 8.3 g/dL   Albumin 4.0 3.5 - 5.2 g/dL   AST 12 0 - 37 U/L   ALT 9 0 - 53 U/L   Alkaline Phosphatase 85 39 - 117 U/L   Total Bilirubin 0.9 0.3 - 1.2 mg/dL   GFR calc non  Af Amer 78 (L) >90 mL/min   GFR calc Af Amer >90 >90 mL/min    Comment: (NOTE) The eGFR has been calculated using the CKD EPI equation. This calculation has not been validated in all clinical situations. eGFR's persistently <90 mL/min signify possible Chronic Kidney Disease.    Anion gap 12 5 - 15  CBC with Differential     Status: Abnormal   Collection Time: 01/12/15 11:17 PM  Result Value Ref Range   WBC 12.2 (H) 4.0 - 10.5 K/uL   RBC 5.11 4.22 - 5.81 MIL/uL   Hemoglobin 13.5 13.0 - 17.0 g/dL   HCT 41.8 39.0 - 52.0 %   MCV 81.8 78.0 - 100.0 fL   MCH 26.4 26.0 - 34.0 pg   MCHC 32.3 30.0 - 36.0 g/dL   RDW 13.6 11.5 - 15.5 %   Platelets 192 150 - 400 K/uL   Neutrophils Relative % 80 (H) 43 - 77 %   Neutro Abs 9.8 (H) 1.7 - 7.7 K/uL   Lymphocytes Relative 12 12 - 46 %   Lymphs Abs 1.4 0.7 - 4.0 K/uL   Monocytes Relative 7 3 - 12 %   Monocytes Absolute 0.9 0.1 - 1.0 K/uL   Eosinophils Relative 1 0 - 5 %  Eosinophils Absolute 0.1 0.0 - 0.7 K/uL   Basophils Relative 0 0 - 1 %   Basophils Absolute 0.0 0.0 - 0.1 K/uL  Glucose, capillary     Status: Abnormal   Collection Time: 01/13/15  2:24 AM  Result Value Ref Range   Glucose-Capillary 274 (H) 70 - 99 mg/dL  Basic metabolic panel     Status: Abnormal   Collection Time: 01/13/15  5:03 AM  Result Value Ref Range   Sodium 135 135 - 145 mmol/L   Potassium 4.0 3.5 - 5.1 mmol/L   Chloride 100 96 - 112 mmol/L   CO2 23 19 - 32 mmol/L   Glucose, Bld 230 (H) 70 - 99 mg/dL   BUN 12 6 - 23 mg/dL   Creatinine, Ser 6.61 0.50 - 1.35 mg/dL   Calcium 9.0 8.4 - 97.2 mg/dL   GFR calc non Af Amer >90 >90 mL/min   GFR calc Af Amer >90 >90 mL/min    Comment: (NOTE) The eGFR has been calculated using the CKD EPI equation. This calculation has not been validated in all clinical situations. eGFR's persistently <90 mL/min signify possible Chronic Kidney Disease.    Anion gap 12 5 - 15  CBC     Status: Abnormal   Collection Time: 01/13/15   5:03 AM  Result Value Ref Range   WBC 10.3 4.0 - 10.5 K/uL   RBC 4.61 4.22 - 5.81 MIL/uL   Hemoglobin 12.0 (L) 13.0 - 17.0 g/dL   HCT 41.0 (L) 01.5 - 31.0 %   MCV 80.9 78.0 - 100.0 fL   MCH 26.0 26.0 - 34.0 pg   MCHC 32.2 30.0 - 36.0 g/dL   RDW 97.0 64.6 - 89.4 %   Platelets 167 150 - 400 K/uL  Hemoglobin A1c     Status: Abnormal   Collection Time: 01/13/15  5:43 AM  Result Value Ref Range   Hgb A1c MFr Bld 11.8 (H) 4.8 - 5.6 %    Comment: (NOTE)         Pre-diabetes: 5.7 - 6.4         Diabetes: >6.4         Glycemic control for adults with diabetes: <7.0    Mean Plasma Glucose 292 mg/dL    Comment: (NOTE) Performed At: [facility] [address] [place], [place] [id number] Mila Homer MD OX:[id number]   Glucose, capillary     Status: Abnormal   Collection Time: 01/13/15  9:05 AM  Result Value Ref Range   Glucose-Capillary 228 (H) 70 - 99 mg/dL  Glucose, capillary     Status: Abnormal   Collection Time: 01/13/15 12:08 PM  Result Value Ref Range   Glucose-Capillary 198 (H) 70 - 99 mg/dL  Surgical pcr screen     Status: None   Collection Time: 01/13/15  2:53 PM  Result Value Ref Range   MRSA, PCR NEGATIVE NEGATIVE   Staphylococcus aureus NEGATIVE NEGATIVE    Comment:        The Xpert SA Assay (FDA approved for NASAL specimens in patients over 10 years of age), is one component of a comprehensive surveillance program.  Test performance has been validated by [facility] for patients greater than or equal to 84 year old. It is not intended to diagnose infection nor to guide or monitor treatment.   Wound culture     Status: None (Preliminary result)   Collection Time: 01/13/15  4:00 PM  Result Value Ref Range  Specimen Description WOUND    Special Requests LEFT FOOT    Gram Stain      MODERATE WBC PRESENT, PREDOMINANTLY PMN FEW SQUAMOUS EPITHELIAL CELLS PRESENT MODERATE GRAM POSITIVE COCCI IN PAIRS IN CHAINS IN CLUSTERS Performed at [facility]    Culture      Culture reincubated for better growth Performed at [facility]    Report Status PENDING   Glucose, capillary     Status: Abnormal   Collection Time: 01/13/15  5:33 PM  Result Value Ref Range   Glucose-Capillary 157 (H) 70 - 99 mg/dL  Glucose, capillary     Status: Abnormal   Collection Time: 01/14/15  6:36 AM  Result Value Ref Range   Glucose-Capillary 243 (H) 70 - 99 mg/dL  Glucose, capillary     Status: Abnormal   Collection Time: 01/14/15  3:03 PM  Result Value Ref Range   Glucose-Capillary 288 (H) 70 - 99 mg/dL  Glucose, capillary     Status: Abnormal   Collection Time: 01/14/15  4:30 PM  Result Value Ref Range   Glucose-Capillary 255 (H) 70 - 99 mg/dL    Dg Foot 2 Views Left  01/13/2015   CLINICAL DATA:  Left foot pain and infection for 1 week.  Diabetes  EXAM: LEFT FOOT - 2 VIEW  COMPARISON:  None.  FINDINGS: Extensive soft tissue gas in the medial forefoot, around the first MTP joint. Gas overlaps the medial joint, and may reach the medial sesamoid. There is no definitive osteomyelitis or septic arthritis. No dissecting subcutaneous gas.  No acute fracture dislocation. Diffuse arterial calcification consistent with a diabetes history.  IMPRESSION: Soft tissue gas around the first MTP joint consistent with ulceration or necrotizing infection. Although there is no definitive osteomyelitis, there is very close proximity of bone and gas.   Electronically Signed   By: Monte Fantasia M.D.   On: 01/13/2015 00:14    Review of Systems  All other systems reviewed and are negative.  Blood pressure 133/62, pulse 81, temperature 97.8 F (36.6 C), temperature source Oral, resp. rate 18, height $RemoveBe'5\' 10"'tFPYiIhRH$  (1.778 m), weight 82.781 kg (182 lb 8 oz), SpO2 96 %. Physical Exam On examination patient does not have palpable pulses. Patient has black ischemic margins around the ulcer of the MTP joint plantar aspect of the great toe the ulcer is 3 cm in  diameter. Assessment/Plan: Assessment: Ischemic ulcer left great toe MTP joint.  Plan: Patient at minimum would require a first ray amputation if he has sufficient circulation. I have ordered an ankle-brachial indices. If the ankle-brachial indices shows significant decreased circulation would recommend consult in vascular surgery prior to considering any type of surgical intervention.Newt Minion 01/14/2015, 7:58 PM

## 2015-01-14 NOTE — Progress Notes (Addendum)
[facility] PROGRESS NOTE  Dashawn Bartnick EAV:[id number] DOB: [birth year] DOA: 01/12/2015 PCP: No primary care provider on file.  Assessment/Plan: Principal Problem:   Cellulitis and abscess of foot excluding toe Active Problems:   Essential hypertension   Cellulitis and abscess of foot   Diabetes mellitus without complication   Leukocytosis    Principal Problem:  1. Cellulitis and abscess of foot excluding toe/ Cellulitis and abscess of foot IV Vancomycin and Zosyn Status post irrigation and debridement by Dr. Magnus Ivan, Dr. Lajoyce Corners TO decide IF  the patient needs first ray amputation   Active Problems:  2. Essential hypertension Continue Prazosin, and Amlodipine Rx Monitor BPs    3. Diabetes mellitus without complication Continue Glipizide Rx Add SSI Coverage PRN, restart Lantus, hold a.m. Lantus if the patient is anticipated to go to the OR tomorrow CHO Modified Medium Heart Healthy Diet    4. Leukocytosis- due to #1 Monitor Trend     5. Hx of Pancreatic and Renal Transplant-  Continue Tacrolimus  Continue Cellcept Rx    6. DVT Prophylaxis SQ Heparin    Code Status: full Family Communication: family updated about patient's clinical progress Disposition Plan:  As above    Brief narrative: 64 y.o. male with a history of a Pancreatic and Renal Transplant, DM2, HTN, and CKD who presents to the ED with complaints of increased pain redness and swelling of his left foot x 1 week. He reports that he was trimming a callous on his left foot and must have cut to deeply because he reports his symptoms began the next day. He denies any fevers and chills. He was  found to have evidence of inflammation but no signs of Osteomyeltis on Plain X-rays. Orthopedics was consulted to see in the AM, and IV Vancomycin and Zosyn were started.   Consultants:  Orthopedics  Procedures:  *Irrigation and excisional debridement of left foot abscess  Antibiotics: Vancomycin/Zosyn  HPI/Subjective: Comfortable, does not report any pain from his surgery yesterday  Objective: Filed Vitals:   01/13/15 1730 01/13/15 1745 01/13/15 1817 01/14/15 0505  BP: 157/71 146/65 145/65 127/61  Pulse: 85 85 85 79  Temp:  98.6 F (37 C) 98.6 F (37 C) 98.1 F (36.7 C)  TempSrc:   Oral Oral  Resp: Height:      Weight:      SpO2: 96% 100% 100% 98%    Intake/Output Summary (Last 24 hours) at 01/14/15 1140 Last data filed at 01/14/15 0856  Gross per 24 hour  Intake   1360 ml  Output   1055 ml  Net    305 ml    Exam:  General: No acute respiratory distress Lungs: Clear to auscultation bilaterally without wheezes or crackles Cardiovascular: Regular rate and rhythm without murmur gallop or rub normal S1 and S2 Abdomen: Nontender, nondistended, soft, bowel sounds positive, no rebound, no ascites, no appreciable mass Extremities: No significant cyanosis, clubbing, or edema bilateral lower extremities      Data Reviewed: Basic Metabolic Panel:  Recent Labs Lab 01/12/15 2317 01/13/15 0503  NA 133* 135  K 4.5 4.0  CL 97 100  CO2 24 23  GLUCOSE 308* 230*  BUN 13 12  CREATININE 1.01 0.79  CALCIUM 9.6 9.0    Liver Function Tests:  Recent Labs Lab 01/12/15 2317  AST 12  ALT 9  ALKPHOS 85  BILITOT 0.9  PROT 8.0  ALBUMIN 4.0   No results for input(s): LIPASE, AMYLASE in the  last 168 hours. No results for input(s): AMMONIA in the last 168 hours.  CBC:  Recent Labs Lab 01/12/15 2317 01/13/15 0503  WBC 12.2* 10.3  NEUTROABS 9.8*  --   HGB 13.5 12.0*  HCT 41.8 37.3*  MCV 81.8 80.9  PLT 192 167    Cardiac Enzymes: No  results for input(s): CKTOTAL, CKMB, CKMBINDEX, TROPONINI in the last 168 hours. BNP (last 3 results) No results for input(s): BNP in the last 8760 hours.  ProBNP (last 3 results) No results for input(s): PROBNP in the last 8760 hours.    CBG:  Recent Labs Lab 01/13/15 0224 01/13/15 0905 01/13/15 1208 01/13/15 1733 01/14/15 0636  GLUCAP 274* 228* 198* 157* 243*    Recent Results (from the past 240 hour(s))  Surgical pcr screen     Status: None   Collection Time: 01/13/15  2:53 PM  Result Value Ref Range Status   MRSA, PCR NEGATIVE NEGATIVE Final   Staphylococcus aureus NEGATIVE NEGATIVE Final    Comment:        The Xpert SA Assay (FDA approved for NASAL specimens in patients over 15 years of age), is one component of a comprehensive surveillance program.  Test performance has been validated by [facility] for patients greater than or equal to 58 year old. It is not intended to diagnose infection nor to guide or monitor treatment.   Wound culture     Status: None (Preliminary result)   Collection Time: 01/13/15  4:00 PM  Result Value Ref Range Status   Specimen Description WOUND  Final   Special Requests LEFT FOOT  Final   Gram Stain PENDING  Incomplete   Culture   Final    Culture reincubated for better growth Performed at [facility]    Report Status PENDING  Incomplete     Studies: Dg Foot 2 Views Left  01/13/2015   CLINICAL DATA:  Left foot pain and infection for 1 week.  Diabetes  EXAM: LEFT FOOT - 2 VIEW  COMPARISON:  None.  FINDINGS: Extensive soft tissue gas in the medial forefoot, around the first MTP joint. Gas overlaps the medial joint, and may reach the medial sesamoid. There is no definitive osteomyelitis or septic arthritis. No dissecting subcutaneous gas.  No acute fracture dislocation. Diffuse arterial calcification consistent with a diabetes history.  IMPRESSION: Soft tissue gas around the first MTP joint consistent with ulceration or  necrotizing infection. Although there is no definitive osteomyelitis, there is very close proximity of bone and gas.   Electronically Signed   By: Marnee Spring M.D.   On: 01/13/2015 00:14    Scheduled Meds: . allopurinol  300 mg Oral BID  . amLODipine  10 mg Oral q morning - 10a  . glipiZIDE  10 mg Oral QAC breakfast  . heparin  5,000 Units Subcutaneous 3 times per day  . insulin aspart  0-9 Units Subcutaneous TID WC  . insulin glargine  20 Units Subcutaneous Once  . mycophenolate  1,000 mg Oral BID  . pantoprazole  40 mg Oral Daily  . piperacillin-tazobactam (ZOSYN)  IV  3.375 g Intravenous 3 times per day  . prazosin  6 mg Oral QHS  . sertraline  50 mg Oral QHS  . simvastatin  20 mg Oral QHS  . tacrolimus  5 mg Oral BID  . vancomycin  1,000 mg Intravenous Q12H   Continuous Infusions: . sodium chloride 75 mL/hr at 01/13/15 0600  . sodium chloride 75 mL/hr at  01/13/15 1825    Principal Problem:   Cellulitis and abscess of foot excluding toe Active Problems:   Essential hypertension   Cellulitis and abscess of foot   Diabetes mellitus without complication   Leukocytosis    Time spent: 40 minutes   [facility] BakersfieldBROL,Jacquelina Hewins  [facility] Pager [phone]. If 7PM-7AM, please contact night-coverage at [email or web address], password [email or web address] 01/14/2015, 11:40 AM  LOS: 1 day

## 2015-01-14 NOTE — Progress Notes (Signed)
Utilization review completed.  

## 2015-01-14 NOTE — Progress Notes (Signed)
Patient ID: Ascencion DikeClarence Manus, male   DOB: Jun 14, 1951, 64 y.o.   MRN: [id number] Mr. Meidinger's left foot infection was quite extensive under his 1st metatarsal and required removing necrotic soft-tissue to contain the infection.  He will need hydrotherapy today and on his foot fololwed by a wet-to-dry dressing.  He will need some form of soft-tissue coverage vs a 1st ray amputation.  I will consult Dr. Lajoyce Cornersuda to assess and give input.

## 2015-01-14 NOTE — Progress Notes (Signed)
Inpatient Diabetes Program Recommendations  AACE/ADA: New Consensus Statement on Inpatient Glycemic Control (2013)  Target Ranges:  Prepandial:   less than 140 mg/dL      Peak postprandial:   less than 180 mg/dL (1-2 hours)      Critically ill patients:  140 - 180 mg/dL   Results for [name], [name] (MRN [id number]) as of 01/14/2015 10:47  Ref. Range 01/13/2015 02:24 01/13/2015 09:05 01/13/2015 12:08 01/13/2015 17:33 01/14/2015 06:36  Glucose-Capillary Latest Ref Range: 70-99 mg/dL 409274 (H) 811228 (H) 914198 (H) 157 (H) 243 (H)    Diabetes history: DM2 Outpatient Diabetes medications: Glipizide 10 mg QAM Current orders for Inpatient glycemic control: Novolog 0-9 units TID with meals, Glipizide 10 mg QAM  Inpatient Diabetes Program Recommendations Insulin - Basal: Note one time order for Lantus 20 units yesterday. However, Lantus was NOT GIVEN as it was charted as "patient refused". Please consider ordering Lantus 8 units daily starting now. Correction (SSI): Please consider increasing Novolog correction to moderate scale and adding Novolog bedtime correction scale.  Note: MD: Please talk with patient regarding importance of taking medications as ordered. Stress importance of glycemic control with wound healing.  Thanks, Orlando PennerMarie Brenin Heidelberger, RN, MSN, CCRN, CDE Diabetes Coordinator Inpatient Diabetes Program [phone] (Team Pager from 8am to 5pm) [phone] (AP office) [phone] [facility] office)

## 2015-01-15 LAB — GLUCOSE, CAPILLARY
Glucose-Capillary: 206 mg/dL — ABNORMAL HIGH (ref 70–99)
Glucose-Capillary: 269 mg/dL — ABNORMAL HIGH (ref 70–99)
Glucose-Capillary: 164 mg/dL — ABNORMAL HIGH (ref 70–99)
Glucose-Capillary: 218 mg/dL — ABNORMAL HIGH (ref 70–99)

## 2015-01-15 LAB — COMPREHENSIVE METABOLIC PANEL
ALT: 10 U/L (ref 0–53)
AST: 11 U/L (ref 0–37)
Albumin: 3.1 g/dL — ABNORMAL LOW (ref 3.5–5.2)
Alkaline Phosphatase: 63 U/L (ref 39–117)
Anion gap: 9 (ref 5–15)
BUN: 7 mg/dL (ref 6–23)
CO2: 26 mmol/L (ref 19–32)
Calcium: 9.1 mg/dL (ref 8.4–10.5)
Chloride: 103 mmol/L (ref 96–112)
Creatinine, Ser: 0.87 mg/dL (ref 0.50–1.35)
GFR calc Af Amer: >90 mL/min (ref >90)
GFR calc non Af Amer: >90 mL/min (ref >90)
Glucose, Bld: 226 mg/dL — ABNORMAL HIGH (ref 70–99)
Potassium: 4 mmol/L (ref 3.5–5.1)
Sodium: 138 mmol/L (ref 135–145)
Total Bilirubin: 0.8 mg/dL (ref 0.3–1.2)
Total Protein: 6.8 g/dL (ref 6.0–8.3)

## 2015-01-15 LAB — CBC
HCT: 35.3 % — ABNORMAL LOW (ref 39.0–52.0)
Hemoglobin: 11.2 g/dL — ABNORMAL LOW (ref 13.0–17.0)
MCH: 25.8 pg — ABNORMAL LOW (ref 26.0–34.0)
MCHC: 31.7 g/dL (ref 30.0–36.0)
MCV: 81.3 fL (ref 78.0–100.0)
Platelets: 165 10*3/uL (ref 150–400)
RBC: 4.34 MIL/uL (ref 4.22–5.81)
RDW: 13.5 % (ref 11.5–15.5)
WBC: 7.5 10*3/uL (ref 4.0–10.5)

## 2015-01-15 NOTE — Progress Notes (Addendum)
Physical Therapy Wound Treatment Patient Details  Name: [name] MRN: [id number] Date of Birth: December 28, 1950  Today's Date: 01/15/2015 Time: 6950-7225 Time Calculation (min): 31 min  Subjective  Subjective: Pt states he wished he had had his foot looked at sooner. Patient and Family Stated Goals: Get foot better. Date of Onset: 01/04/15 Prior Treatments: surgical I&D  Pain Score:  Throbbing in lt foot severe. Nursing in and gave pt IV and then PO pain meds.  Wound Assessment  Wound / Incision (Open or Dehisced) 01/13/15 Puncture Foot Posterior swelling below left great toe/small red area (Active)  Dressing Type Compression wrap;Gauze (Comment) 01/15/2015  1:32 PM  Dressing Changed Changed 01/15/2015  1:32 PM  Dressing Status Clean;Dry;Intact 01/15/2015  1:32 PM  Dressing Change Frequency Daily 01/15/2015  1:32 PM  Site / Wound Assessment Red;Pink;Yellow;Black 01/15/2015  1:32 PM  % Wound base Red or Granulating 65% 01/15/2015  1:32 PM  % Wound base Yellow 5% 01/15/2015  1:32 PM  % Wound base Black 5% 01/15/2015  1:32 PM  % Wound base Other (Comment) 25% 01/15/2015  1:32 PM  Peri-wound Assessment Erythema (blanchable) 01/15/2015  1:32 PM  Wound Length (cm) 6 cm 01/14/2015  8:58 AM  Wound Width (cm) 4.5 cm 01/14/2015  8:58 AM  Wound Depth (cm) 0.5 cm 01/14/2015  8:58 AM  Margins Unattached edges (unapproximated) 01/15/2015  1:32 PM  Closure None 01/15/2015  1:32 PM  Drainage Amount None 01/15/2015  1:32 PM  Drainage Description Serous 01/15/2015  1:32 PM  Treatment Hydrotherapy (Pulse lavage);Packing (Saline gauze) 01/15/2015  1:32 PM   Hydrotherapy Pulsed lavage therapy - wound location: lt 1st metatarsal head Pulsed Lavage with Suction (psi): 4 psi (4-8) Pulsed Lavage with Suction - Normal Saline Used: 500 mL Pulsed Lavage Tip: Tip with splash shield   Wound Assessment and Plan  Wound Therapy - Assess/Plan/Recommendations Wound Therapy - Clinical Statement: Darkening of wound  on medial  edge. Wound Therapy - Functional Problem List: Decr mobility Factors Delaying/Impairing Wound Healing: Diabetes Mellitus;Infection - systemic/local;Multiple medical problems;Polypharmacy Hydrotherapy Plan: Debridement;Dressing change;Patient/family education;Pulsatile lavage with suction Wound Therapy - Frequency: Other (comment) (Ordered for Monday and Tuesday) Wound Therapy - Follow Up Recommendations: [place] Wound Plan: See above  Wound Therapy Goals- Improve the function of patient's integumentary system by progressing the wound(s) through the phases of wound healing (inflammation - proliferation - remodeling) by: Decrease Necrotic Tissue to: 0 Decrease Necrotic Tissue - Progress: Not progressing Improve Drainage Characteristics: Min Improve Drainage Characteristics - Progress: Met  Goals will be updated until maximal potential achieved or discharge criteria met.  Discharge criteria: when goals achieved, discharge from hospital, MD decision/surgical intervention, no progress towards goals, refusal/missing three consecutive treatments without notification or medical reason.  GP     [name] 01/15/2015, 1:36 PM  [place]

## 2015-01-15 NOTE — Progress Notes (Signed)
[facility] PROGRESS NOTE  [name] ZOX:[id number] DOB: 1951-07-24 DOA: 01/12/2015 PCP: No primary care provider on file.  Assessment/Plan: Principal Problem:   Cellulitis and abscess of foot excluding toe Active Problems:   Essential hypertension   Cellulitis and abscess of foot   Diabetes mellitus without complication   Leukocytosis    Principal Problem:  1. Cellulitis and abscess of foot excluding toe/ Cellulitis and abscess of foot IV Vancomycin and Zosyn Status post irrigation and debridement by Dr. Magnus IvanBlackman, Dr. Lajoyce Cornersuda TO decide IF  the patient needs first ray amputation. Patient is supposed to have an ABI, possible vascular surgery consultation if ABI is abnormal.   Active Problems:  2. Essential hypertension Continue Prazosin, and Amlodipine Rx Monitor BPs    3. Diabetes mellitus without complication Continue Glipizide Rx Add SSI Coverage PRN, patient has been offered Lantus multiple times during this admission but has been refusing it, CBGs uncontrolled. Discussed with the patient about compliance. Hemoglobin A1c 11.8   4. Leukocytosis- due to #1 Monitor Trend     5. Hx of Pancreatic and Renal Transplant-  Continue Tacrolimus  Continue Cellcept Rx    6. DVT Prophylaxis SQ Heparin    Code Status: full Family Communication: family updated about patient's clinical progress Disposition Plan: Orthopedics to make final plans about surgery   Brief narrative: 64 y.o. male with a history of a Pancreatic and Renal Transplant, DM2, HTN, and CKD who presents to the ED with complaints of increased pain redness and swelling of his left foot x 1 week. He reports  that he was trimming a callous on his left foot and must have cut to deeply because he reports his symptoms began the next day. He denies any fevers and chills. He was found to have evidence of inflammation but no signs of Osteomyeltis on Plain X-rays. Orthopedics was consulted to see in the AM, and IV Vancomycin and Zosyn were started.   Consultants:  Orthopedics  Procedures:  *Irrigation and excisional debridement of left foot abscess  Antibiotics: Vancomycin/Zosyn  HPI/Subjective: CBG elevated, afebrile  Objective: Filed Vitals:   01/14/15 0505 01/14/15 1345 01/14/15 2038 01/15/15 0550  BP: 127/61 133/62 125/59 137/54  Pulse: 79 81 78 75  Temp: 98.1 F (36.7 C) 97.8 F (36.6 C) 98 F (36.7 C) 98.7 F (37.1 C)  TempSrc: Oral Oral    Resp: 19 18 18 16   Height:      Weight:      SpO2: 98% 96% 98% 96%    Intake/Output Summary (Last 24 hours) at 01/15/15 0837 Last data filed at 01/15/15 0550  Gross per 24 hour  Intake    960 ml  Output   2525 ml  Net  -1565 ml    Exam:  General: No acute respiratory distress Lungs: Clear to auscultation bilaterally without wheezes or crackles Cardiovascular: Regular rate and rhythm without murmur gallop or rub normal S1 and S2 Abdomen: Nontender, nondistended, soft, bowel sounds positive, no rebound, no ascites, no appreciable mass On examination patient does not have palpable pulses. Patient has black ischemic margins around the ulcer of the MTP joint plantar aspect of the great toe the ulcer is 3 cm in diameter.      Data Reviewed: Basic Metabolic Panel:  Recent Labs Lab 01/12/15 2317 01/13/15 0503 01/15/15 0600  NA 133* 135 138  K 4.5 4.0 4.0  CL 97 100 103  CO2 24 23 26   GLUCOSE 308* 230* 226*  BUN 13 12 7  CREATININE 1.01 0.79 0.87  CALCIUM 9.6 9.0 9.1    Liver Function Tests:  Recent Labs Lab 01/12/15 2317 01/15/15 0600  AST 12 11  ALT 9 10  ALKPHOS 85 63  BILITOT 0.9 0.8  PROT 8.0 6.8   ALBUMIN 4.0 3.1*   No results for input(s): LIPASE, AMYLASE in the last 168 hours. No results for input(s): AMMONIA in the last 168 hours.  CBC:  Recent Labs Lab 01/12/15 2317 01/13/15 0503 01/15/15 0600  WBC 12.2* 10.3 7.5  NEUTROABS 9.8*  --   --   HGB 13.5 12.0* 11.2*  HCT 41.8 37.3* 35.3*  MCV 81.8 80.9 81.3  PLT 192 167 165    Cardiac Enzymes: No results for input(s): CKTOTAL, CKMB, CKMBINDEX, TROPONINI in the last 168 hours. BNP (last 3 results) No results for input(s): BNP in the last 8760 hours.  ProBNP (last 3 results) No results for input(s): PROBNP in the last 8760 hours.    CBG:  Recent Labs Lab 01/14/15 0636 01/14/15 1503 01/14/15 1630 01/14/15 2135 01/15/15 0636  GLUCAP 243* 288* 255* 218* 206*    Recent Results (from the past 240 hour(s))  Surgical pcr screen     Status: None   Collection Time: 01/13/15  2:53 PM  Result Value Ref Range Status   MRSA, PCR NEGATIVE NEGATIVE Final   Staphylococcus aureus NEGATIVE NEGATIVE Final    Comment:        The Xpert SA Assay (FDA approved for NASAL specimens in patients over 22 years of age), is one component of a comprehensive surveillance program.  Test performance has been validated by [facility] for patients greater than or equal to 27 year old. It is not intended to diagnose infection nor to guide or monitor treatment.   Wound culture     Status: None (Preliminary result)   Collection Time: 01/13/15  4:00 PM  Result Value Ref Range Status   Specimen Description WOUND  Final   Special Requests LEFT FOOT  Final   Gram Stain   Final    MODERATE WBC PRESENT, PREDOMINANTLY PMN FEW SQUAMOUS EPITHELIAL CELLS PRESENT MODERATE GRAM POSITIVE COCCI IN PAIRS IN CHAINS IN CLUSTERS Performed at [facility]    Culture   Final    ABUNDANT GRAM NEGATIVE RODS Performed at [facility]    Report Status PENDING  Incomplete     Studies: Dg Foot 2 Views Left  01/13/2015   CLINICAL  DATA:  Left foot pain and infection for 1 week.  Diabetes  EXAM: LEFT FOOT - 2 VIEW  COMPARISON:  None.  FINDINGS: Extensive soft tissue gas in the medial forefoot, around the first MTP joint. Gas overlaps the medial joint, and may reach the medial sesamoid. There is no definitive osteomyelitis or septic arthritis. No dissecting subcutaneous gas.  No acute fracture dislocation. Diffuse arterial calcification consistent with a diabetes history.  IMPRESSION: Soft tissue gas around the first MTP joint consistent with ulceration or necrotizing infection. Although there is no definitive osteomyelitis, there is very close proximity of bone and gas.   Electronically Signed   By: Marnee Spring M.D.   On: 01/13/2015 00:14    Scheduled Meds: . allopurinol  300 mg Oral BID  . amLODipine  10 mg Oral q morning - 10a  . glipiZIDE  10 mg Oral QAC breakfast  . heparin  5,000 Units Subcutaneous 3 times per day  . insulin aspart  0-9 Units Subcutaneous TID WC  . insulin  glargine  25 Units Subcutaneous Once  . mycophenolate  1,000 mg Oral BID  . pantoprazole  40 mg Oral Daily  . piperacillin-tazobactam (ZOSYN)  IV  3.375 g Intravenous 3 times per day  . prazosin  6 mg Oral QHS  . sertraline  50 mg Oral QHS  . simvastatin  20 mg Oral QHS  . tacrolimus  5 mg Oral BID  . vancomycin  1,000 mg Intravenous Q12H   Continuous Infusions: . sodium chloride 75 mL/hr at 01/13/15 0600  . sodium chloride 75 mL/hr at 01/13/15 1825    Principal Problem:   Cellulitis and abscess of foot excluding toe Active Problems:   Essential hypertension   Cellulitis and abscess of foot   Diabetes mellitus without complication   Leukocytosis    Time spent: 40 minutes   [facility]  [facility] Pager [phone]. If 7PM-7AM, please contact night-coverage at [email or web address], password [facility] 01/15/2015, 8:37 AM  LOS: 2 days

## 2015-01-15 NOTE — Progress Notes (Signed)
Physical Therapy Treatment Patient Details Name: [name] MRN: [id number] DOB: [birth year] Today's Date: 01/15/2015    History of Present Illness Pt is a 64 y/o M s/p irrigation and excisional debridement of L foot abscess on plantar aspect of foot under 1st ray.  Pt's PMH includes PTSD, legally blind in L eye, DM, renal disorder.    PT Comments    Patient feels more secure and able to mobilize with knee walker.  States he feels will be covered with [facility] benefits.  Information given on vendor and model [place].  Will still need regular walker versus crutches to assist with stair negotiation.  Will follow up for stair training.  Follow Up Recommendations  Supervision for mobility/OOB;No PT follow up     Equipment Recommendations  Rolling walker with 5" wheels;3in1 (PT);Other (comment) (knee walker)    Recommendations for Other Services       Precautions / Restrictions Precautions Precautions: Fall Restrictions LLE Weight Bearing: Non weight bearing    Mobility  Bed Mobility Overal bed mobility: Modified Independent                Transfers Overall transfer level: Needs assistance Equipment used: 4-wheeled walker (knee walker) Transfers: Sit to/from Stand Sit to Stand: Min assist         General transfer comment: assist and cues for safe transition from sitting to stand with left knee on knee walker; education for safety with use of brakes and assist for IV  Ambulation/Gait Ambulation/Gait assistance: Supervision Ambulation Distance (Feet): 250 Feet Assistive device: 4-wheeled walker (knee walker) Gait Pattern/deviations: Step-to pattern     General Gait Details: cues for technique and safety and to slow speed due to pt leaning weight mostly on left knee and "scootering" along propelling with right foot, one standing rest break   Stairs            Wheelchair Mobility    Modified Rankin (Stroke Patients Only)        Balance Overall balance assessment: Needs assistance         Standing balance support: Bilateral upper extremity supported Standing balance-Leahy Scale: Poor Standing balance comment: knees support with UE's for balance                    Cognition Arousal/Alertness: Awake/alert Behavior During Therapy: WFL for tasks assessed/performed Overall Cognitive Status: Within Functional Limits for tasks assessed                      Exercises      General Comments General comments (skin integrity, edema, etc.): pt wishing to wash up; assisted into bathroom onto chair in front of sink pt brushing teeth and nurse tech taking over to assist with bath      Pertinent Vitals/Pain Pain Assessment: No/denies pain (just pre med for hydrotherapy recent) Pain Intervention(s): Monitored during session    Home Living                      Prior Function            PT Goals (current goals can now be found in the care plan section) Progress towards PT goals: Progressing toward goals    Frequency  Min 5X/week    PT Plan Current plan remains appropriate    Co-evaluation             End of Session Equipment Utilized During Treatment:  Gait belt Activity Tolerance: Patient tolerated treatment well Patient left: in chair;with nursing/sitter in room     Time: 1129-1156 PT Time Calculation (min) (ACUTE ONLY): 27 min  Charges:  $Gait Training: 23-37 mins                    G Codes:      WYNN,CYNDI 02/14/15, 1:50 PM  Sheran Lawless, PT [phone] 14-Feb-2015

## 2015-01-15 NOTE — Progress Notes (Signed)
Inpatient Diabetes Program Recommendations  AACE/ADA: New Consensus Statement on Inpatient Glycemic Control (2013)  Target Ranges:  Prepandial:   less than 140 mg/dL      Peak postprandial:   less than 180 mg/dL (1-2 hours)      Critically ill patients:  140 - 180 mg/dL     Results for [name], [name] (MRN [id number]) as of 01/15/2015 14:39  Ref. Range 01/15/2015 06:36 01/15/2015 11:57  Glucose-Capillary Latest Ref Range: 70-99 mg/dL 409206 (H) 811269 (H)   Results for [name], [name] (MRN [id number]) as of 01/15/2015 14:39  Ref. Range 01/13/2015 05:43  Hemoglobin A1C Latest Ref Range: 4.8-5.6 % 11.8 (H)    Admit with: Cellulitis/ Abscess of Foot  History: DM, HTN, Pancreatic/Renal Transplant  Home DM Meds: Glipizide 10 mg daily  Current DM Orders: Novolog Sensitive SSI tid             Glipizide 10 mg daily    **Note patient refused Lantus 25 units yesterday PM.  Patient has also been refusing Novolog at times too.  **Patient did agree to take Novolog SSI this AM and also at 12PM.  **Spoke to patient about their current A1c of 11.8%.  Explained what an A1c is and what it measures.  Reminded patient that his goal A1c is 7% or less per ADA standards to prevent both acute and long-term complications.  Explained to patient the extreme importance of good glucose control at home.  Also explained to patient that he will need better blood sugar control to help heal his foot and prevent further infection.  **Patient [facility] Rx for Lantus insulin about 2-3 years ago.  Patient stated he took the Lantus for a while and then stopped the Lantus b/c he "didn't need it anymore".  Explained to patient that he likely needs insulin at home given his A1c of 11.8%.  Explained to patient that we are currently giving him Novolog (explained what Novolog is and how it works).  Asked patient if he would be willing to take Lantus again and he stated he would take it if he needed to.   Patient also stated he would take Lantus at home if he needed to as well.  Will have RNs assess patient's ability to draw up and self administer insulin just in case decision made to send pt home on insulin.    MD- Please consider ordering Lantus again.  Patient states he will take it if needed.  Lantus 16 units daily (0.2 units/kg dosing)     Will follow Ambrose FinlandJeannine Johnston Marianne Golightly RN, MSN, CDE Diabetes Coordinator Inpatient Diabetes Program Team Pager: [phone] (8a-5p)

## 2015-01-16 DIAGNOSIS — L03119 Cellulitis of unspecified part of limb: Secondary | ICD-10-CM

## 2015-01-16 DIAGNOSIS — I739 Peripheral vascular disease, unspecified: Secondary | ICD-10-CM

## 2015-01-16 DIAGNOSIS — L02619 Cutaneous abscess of unspecified foot: Secondary | ICD-10-CM

## 2015-01-16 LAB — WOUND CULTURE

## 2015-01-16 LAB — GLUCOSE, CAPILLARY
Glucose-Capillary: 222 mg/dL — ABNORMAL HIGH (ref 70–99)
Glucose-Capillary: 288 mg/dL — ABNORMAL HIGH (ref 70–99)
Glucose-Capillary: 150 mg/dL — ABNORMAL HIGH (ref 70–99)
Glucose-Capillary: 233 mg/dL — ABNORMAL HIGH (ref 70–99)

## 2015-01-16 LAB — VANCOMYCIN, TROUGH: Vancomycin Tr: 11.7 ug/mL (ref 10.0–20.0)

## 2015-01-16 MED ORDER — CIPROFLOXACIN IN D5W 400 MG/200ML IV SOLN
400.0000 mg | Freq: Two times a day (BID) | INTRAVENOUS | Status: DC
  Administered 2015-01-16 – 2015-01-18 (×5): 400 mg via INTRAVENOUS
  Filled 2015-01-16 (×6): qty 200

## 2015-01-16 MED ORDER — INSULIN GLARGINE 100 UNIT/ML ~~LOC~~ SOLN
16.0000 [IU] | Freq: Every day | SUBCUTANEOUS | Status: DC
  Administered 2015-01-16 – 2015-01-23 (×8): 16 [IU] via SUBCUTANEOUS
  Filled 2015-01-16 (×8): qty 0.16

## 2015-01-16 NOTE — Progress Notes (Signed)
PT Cancellation Note  Patient Details Name: [name] DikeClarence [name] MRN: [id number] DOB: 1951-07-09   Cancelled Treatment:    Reason Eval/Treat Not Completed: Other (comment); patient currently visiting with family and friends celebrating his birthday.  Will see tomorrow.   WYNN,CYNDI 01/16/2015, 4:06 PM

## 2015-01-16 NOTE — Progress Notes (Signed)
Hydrotherapy Note  Order for hydrotherapy was for Monday and Tuesday only. Will sign off. Please reorder if needed.  Fluor CorporationCary Latoria Dry PT [phone]

## 2015-01-16 NOTE — Progress Notes (Signed)
VASCULAR LAB PRELIMINARY  ARTERIAL  ABI completed:  ABIs not accurate due to calcified vessels.  Waveforms briskly monophasic.    RIGHT    LEFT    PRESSURE WAVEFORM  PRESSURE WAVEFORM  BRACHIAL HD access  BRACHIAL 139 Triphasic   DP >255 Monophasic DP 192 Monophasic   AT   AT    PT 146 Monophasic  PT >255 Monophasic   PER   PER    GREAT TOE  NA GREAT TOE  NA    RIGHT LEFT  ABI Not accurate due to calcified vessels. Not accurate due to calcified vessels.     Clifford Benninger, RVT 01/16/2015, 3:10 PM

## 2015-01-16 NOTE — Progress Notes (Signed)
Patient was given handouts regarding how to give Insulin, how to care for diabetes, cellulitis, high blood sugar, and treatment for Diabetes. While discussing the handouts, patient stated he remembered a lot of the information from previous classes and admissions. Pt is able to verbally teach back how to draw up insulin and administer insulin. This RN demonstrated proper drawing up technique of insulin. Pt also given pt education video brochures with verbal instruction how to start videos. Pt was reluctant to teaching and will need further reinforcement.

## 2015-01-16 NOTE — Progress Notes (Signed)
ANTIBIOTIC CONSULT NOTE - INITIAL  Pharmacy Consult for Vancomycin Indication: Cellulitis  No Known Allergies  Patient Measurements: Height: 5\' 10"  (177.8 cm) Weight: 182 lb 8 oz (82.781 kg) IBW/kg (Calculated) : 73  Vital Signs: Temp: 98.2 F (36.8 C) (04/20 0320) Temp Source: Oral (04/20 0320) BP: 133/41 mmHg (04/20 1049) Pulse Rate: 89 (04/20 0320)  Labs:  Recent Labs  01/15/15 0600  WBC 7.5  HGB 11.2*  PLT 165  CREATININE 0.87   Estimated Creatinine Clearance: 90.9 mL/min (by C-G formula based on Cr of 0.87).  Medical History: Past Medical History  Diagnosis Date  . Renal disorder   . Diabetes mellitus without complication   . CPAP (continuous positive airway pressure) dependence   . Sleep apnea   . [place] in [place]   . PTSD (post-traumatic stress disorder)    Assessment: 64 YO diabetic male with LLE cellulitis, imaging with no definitive osteo, but possible necrotizing infection. On Vancomycin D#4. S/p I&D, pending decision on amputation. Afebrile, wbc wnl. Vancomycin trough therapeutic 11.7 drawn appropriately. Renal function stable  Vanc 4/17>> Zosyn 4/17>> 4/20 Cipro 4/20 >>  4/17 wound Cx>> abundant citrobacter braakii (R-ancef)   Goal of Therapy:  Vancomycin trough level 15-20 mcg/ml, for now  Plan:  - Continue Vancomycin 1000 mg IV q12h - Trend WBC, temp, renal function  - Consider d/c vancomycin soon?  Bayard HuggerMei Owain Eckerman, PharmD, BCPS  Clinical Pharmacist  Pager: [phone]   01/16/2015,2:00 PM

## 2015-01-16 NOTE — Progress Notes (Signed)
[facility] PROGRESS NOTE  [name] ZOX:[id number] DOB: 1951-09-14 DOA: 01/12/2015 PCP: No primary care provider on file.  Assessment/Plan: #1 ischemic ulcer left great toe MTP joint/diabetic foot ulcer Patient is currently afebrile. WBC trending down. Wound cultures growing Citrobacter Braakii. Patient is status post irrigation and excisional debridement of left foot abscess. Will discontinue IV Zosyn and placed on IV ciprofloxacin. Continue vancomycin through today and may discontinue tomorrow. ABIs have been done. Follow.  #2 poorly controlled type 2 diabetes mellitus Hemoglobin A1c is 11.8. CBGs have ranged from 152- 222. Discontinue glipizide. Will start patient on Lantus 18 units daily. Continue sliding scale insulin.  #3 hypertension Stable. Continue Norvasc, prazosin.  #4 leukocytosis Likely secondary to problem #1. WBC trending down. Continue empiric IV antibiotics.  5 history of pancreatic and renal transplant Stable. Continue current regimen of tacrolimus and CellCept.  #6 prophylaxis Heparin for DVT prophylaxis.  Code Status: Full Family Communication: Updated patient. No family at bedside. Disposition Plan: Home when medically stable.   Consultants:  Orthopedics: Dr. Lajoyce Cornersuda 01/14/2015  Procedures:  X-ray left foot 01/12/2015  Irrigation and excisional Debridement left foot abscess per Dr. Magnus IvanBlackman 01/13/2015  Antibiotics:  IV vancomycin 01/13/2015  IV ciprofloxacin 01/16/2015  IV Zosyn 01/12/2015>>> 01/16/2015  HPI/Subjective: Patient states pain controlled. No CP. No SOB  Objective: Filed Vitals:   01/16/15 1419  BP: 131/57  Pulse: 88  Temp: 98.5 F (36.9 C)  Resp: 18    Intake/Output Summary (Last 24 hours) at 01/16/15 1527 Last data filed at 01/16/15 1230  Gross per 24 hour  Intake    840 ml  Output   1200 ml  Net   -360 ml   Filed Weights   01/13/15 0237  Weight: 82.781 kg (182 lb 8 oz)    Exam:   General:   NAD  Cardiovascular: RRR  Respiratory: CTAB  Abdomen: Soft, nontender, nondistended, positive bowel sounds.  Musculoskeletal: No clubbing cyanosis or edema. Left lower extremity bandaged.  Data Reviewed: Basic Metabolic Panel:  Recent Labs Lab 01/12/15 2317 01/13/15 0503 01/15/15 0600  NA 133* 135 138  K 4.5 4.0 4.0  CL 97 100 103  CO2 24 23 26   GLUCOSE 308* 230* 226*  BUN 13 12 7   CREATININE 1.01 0.79 0.87  CALCIUM 9.6 9.0 9.1   Liver Function Tests:  Recent Labs Lab 01/12/15 2317 01/15/15 0600  AST 12 11  ALT 9 10  ALKPHOS 85 63  BILITOT 0.9 0.8  PROT 8.0 6.8  ALBUMIN 4.0 3.1*   No results for input(s): LIPASE, AMYLASE in the last 168 hours. No results for input(s): AMMONIA in the last 168 hours. CBC:  Recent Labs Lab 01/12/15 2317 01/13/15 0503 01/15/15 0600  WBC 12.2* 10.3 7.5  NEUTROABS 9.8*  --   --   HGB 13.5 12.0* 11.2*  HCT 41.8 37.3* 35.3*  MCV 81.8 80.9 81.3  PLT 192 167 165   Cardiac Enzymes: No results for input(s): CKTOTAL, CKMB, CKMBINDEX, TROPONINI in the last 168 hours. BNP (last 3 results) No results for input(s): BNP in the last 8760 hours.  ProBNP (last 3 results) No results for input(s): PROBNP in the last 8760 hours.  CBG:  Recent Labs Lab 01/15/15 1157 01/15/15 1625 01/15/15 2221 01/16/15 0616 01/16/15 1146  GLUCAP 269* 164* 218* 222* 288*    Recent Results (from the past 240 hour(s))  Surgical pcr screen     Status: None   Collection Time: 01/13/15  2:53 PM  Result Value  Ref Range Status   MRSA, PCR NEGATIVE NEGATIVE Final   Staphylococcus aureus NEGATIVE NEGATIVE Final    Comment:        The Xpert SA Assay (FDA approved for NASAL specimens in patients over 62 years of age), is one component of a comprehensive surveillance program.  Test performance has been validated by [facility] for patients greater than or equal to 62 year old. It is not intended to diagnose infection nor to guide or monitor  treatment.   Wound culture     Status: None   Collection Time: 01/13/15  4:00 PM  Result Value Ref Range Status   Specimen Description WOUND  Final   Special Requests LEFT FOOT  Final   Gram Stain   Final    MODERATE WBC PRESENT, PREDOMINANTLY PMN FEW SQUAMOUS EPITHELIAL CELLS PRESENT MODERATE GRAM POSITIVE COCCI IN PAIRS IN CHAINS IN CLUSTERS Performed at [facility]    Culture   Final    ABUNDANT CITROBACTER BRAAKII Performed at [facility]    Report Status 01/16/2015 FINAL  Final   Organism ID, Bacteria CITROBACTER BRAAKII  Final      Susceptibility   Citrobacter braakii - MIC*    CEFAZOLIN >=64 RESISTANT Resistant     CEFEPIME <=1 SENSITIVE Sensitive     CEFTAZIDIME <=1 SENSITIVE Sensitive     CEFTRIAXONE <=1 SENSITIVE Sensitive     CIPROFLOXACIN <=0.25 SENSITIVE Sensitive     GENTAMICIN <=1 SENSITIVE Sensitive     IMIPENEM 0.5 SENSITIVE Sensitive     PIP/TAZO <=4 SENSITIVE Sensitive     TOBRAMYCIN <=1 SENSITIVE Sensitive     TRIMETH/SULFA <=20 SENSITIVE Sensitive     * ABUNDANT CITROBACTER BRAAKII     Studies: No results found.  Scheduled Meds: . allopurinol  300 mg Oral BID  . amLODipine  10 mg Oral q morning - 10a  . ciprofloxacin  400 mg Intravenous Q12H  . glipiZIDE  10 mg Oral QAC breakfast  . heparin  5,000 Units Subcutaneous 3 times per day  . insulin aspart  0-9 Units Subcutaneous TID WC  . insulin glargine  16 Units Subcutaneous Daily  . insulin glargine  25 Units Subcutaneous Once  . mycophenolate  1,000 mg Oral BID  . pantoprazole  40 mg Oral Daily  . prazosin  6 mg Oral QHS  . sertraline  50 mg Oral QHS  . simvastatin  20 mg Oral QHS  . tacrolimus  5 mg Oral BID  . vancomycin  1,000 mg Intravenous Q12H   Continuous Infusions: . sodium chloride 75 mL/hr at 01/13/15 0600  . sodium chloride 75 mL/hr at 01/13/15 1825    Principal Problem:   Cellulitis and abscess of foot excluding toe Active Problems:   Essential  hypertension   Cellulitis and abscess of foot   Diabetes mellitus without complication   Leukocytosis    Time spent: 35 mins    [facility] MD [facility] Pager [phone]. If 7PM-7AM, please contact night-coverage at [email or web address], password [facility] 01/16/2015, 3:27 PM  LOS: 3 days

## 2015-01-16 NOTE — Progress Notes (Signed)
Patient ID: [name] DikeClarence Arwood, male   DOB: 03-29-1951, 64 y.o.   MRN: [id number] Still awaiting ankle-brachial indices. We'll determine intervention based on these studies.

## 2015-01-17 ENCOUNTER — Encounter (HOSPITAL_COMMUNITY): Payer: Self-pay | Admitting: Internal Medicine

## 2015-01-17 DIAGNOSIS — I739 Peripheral vascular disease, unspecified: Secondary | ICD-10-CM

## 2015-01-17 DIAGNOSIS — I70245 Atherosclerosis of native arteries of left leg with ulceration of other part of foot: Secondary | ICD-10-CM

## 2015-01-17 HISTORY — DX: Peripheral vascular disease, unspecified: I73.9

## 2015-01-17 LAB — GLUCOSE, CAPILLARY
Glucose-Capillary: 195 mg/dL — ABNORMAL HIGH (ref 70–99)
Glucose-Capillary: 127 mg/dL — ABNORMAL HIGH (ref 70–99)
Glucose-Capillary: 206 mg/dL — ABNORMAL HIGH (ref 70–99)
Glucose-Capillary: 216 mg/dL — ABNORMAL HIGH (ref 70–99)

## 2015-01-17 LAB — CBC
HCT: 37.4 % — ABNORMAL LOW (ref 39.0–52.0)
Hemoglobin: 12.1 g/dL — ABNORMAL LOW (ref 13.0–17.0)
MCH: 26.3 pg (ref 26.0–34.0)
MCHC: 32.4 g/dL (ref 30.0–36.0)
MCV: 81.3 fL (ref 78.0–100.0)
Platelets: 188 10*3/uL (ref 150–400)
RBC: 4.6 MIL/uL (ref 4.22–5.81)
RDW: 13.7 % (ref 11.5–15.5)
WBC: 7.6 10*3/uL (ref 4.0–10.5)

## 2015-01-17 LAB — BASIC METABOLIC PANEL
Anion gap: 11 (ref 5–15)
BUN: 12 mg/dL (ref 6–23)
CO2: 24 mmol/L (ref 19–32)
Calcium: 9.2 mg/dL (ref 8.4–10.5)
Chloride: 105 mmol/L (ref 96–112)
Creatinine, Ser: 1.06 mg/dL (ref 0.50–1.35)
GFR calc Af Amer: 85 mL/min — ABNORMAL LOW (ref >90)
GFR calc non Af Amer: 73 mL/min — ABNORMAL LOW (ref >90)
Glucose, Bld: 187 mg/dL — ABNORMAL HIGH (ref 70–99)
Potassium: 4.2 mmol/L (ref 3.5–5.1)
Sodium: 140 mmol/L (ref 135–145)

## 2015-01-17 MED ORDER — ASPIRIN EC 81 MG PO TBEC
81.0000 mg | DELAYED_RELEASE_TABLET | Freq: Every day | ORAL | Status: DC
  Administered 2015-01-17 – 2015-01-25 (×8): 81 mg via ORAL
  Filled 2015-01-17 (×10): qty 1

## 2015-01-17 NOTE — Progress Notes (Signed)
Physical Therapy Treatment Patient Details Name: [name] MRN: [id number] DOB: 21-Sep-1951 Today's Date: 01/17/2015    History of Present Illness Pt is a 64 y/o M s/p irrigation and excisional debridement of L foot abscess on plantar aspect of foot under 1st ray.  Pt's PMH includes PTSD, legally blind in L eye, DM, renal disorder.    PT Comments    See notes below for attempt at stairs this session.  Patient needs to practice stairs next session.  Safest option will likely be for pt to use RW to get to first step and then sit on chair placed on 2nd step.  Pt will have assist from wife at home.  Pt's RLE fatigues quickly after ambulating 4 ft and ascend/descending 1 step.  Consider follow up w/ HHPT if pt requires additional stair education following d/c.      Follow Up Recommendations  Supervision for mobility/OOB;No PT follow up     Equipment Recommendations  Rolling walker with 5" wheels;3in1 (PT);Other (comment) (knee walker)    Recommendations for Other Services       Precautions / Restrictions Precautions Precautions: Fall Restrictions Weight Bearing Restrictions: Yes LLE Weight Bearing: Non weight bearing    Mobility  Bed Mobility Overal bed mobility: Modified Independent             General bed mobility comments: increased time, cues for scooting to sit EOB  Transfers Overall transfer level: Needs assistance Equipment used: Rolling walker (2 wheeled) (knee walker) Transfers: Sit to/from Stand Sit to Stand: Modified independent (Device/Increase time)         General transfer comment: cues for hand placement. Pt was able to demonstrate recollection of proper technique for transferring from RW to knee walker  Ambulation/Gait Ambulation/Gait assistance: Supervision Ambulation Distance (Feet): 200 Feet Assistive device:  (knee walker) Gait Pattern/deviations: Step-to pattern     General Gait Details: cues to take turns slowly   Stairs Stairs:  Yes Stairs assistance: Min guard Stair Management: No rails;Step to pattern;Backwards;With walker Number of Stairs: 1 General stair comments: Pt able to ascend/descend 1 step only 2/2 pt unable to maintain balance when attempting to move RW up one step.  Crutches for stair mobility was not attempted 2/2 pt's unsteadiness ambulating w/ crutches.  Pt fatigues quickly after ambulating 4 ft w/ RW.  At next session will attempt to use RW to ascend 1 step and then sit on chair.  Wheelchair Mobility    Modified Rankin (Stroke Patients Only)       Balance Overall balance assessment: Needs assistance Sitting-balance support: No upper extremity supported;Feet supported Sitting balance-Leahy Scale: Good     Standing balance support: Bilateral upper extremity supported;During functional activity Standing balance-Leahy Scale: Fair                      Cognition Arousal/Alertness: Awake/alert Behavior During Therapy: WFL for tasks assessed/performed Overall Cognitive Status: Within Functional Limits for tasks assessed                      Exercises Total Joint Exercises Long Arc Quad: AROM;Both;15 reps;Seated    General Comments General comments (skin integrity, edema, etc.): Pt will attempt to arrange for ramp to be made at home       Pertinent Vitals/Pain Pain Assessment: 0-10 Pain Score: 5  Pain Location: L foot Pain Descriptors / Indicators: Aching;Throbbing Pain Intervention(s): Limited activity within patient's tolerance;Monitored during session;Repositioned    Home Living  Prior Function            PT Goals (current goals can now be found in the care plan section) Acute Rehab PT Goals Patient Stated Goal: none stated Progress towards PT goals: Progressing toward goals    Frequency  Min 5X/week    PT Plan Current plan remains appropriate    Co-evaluation             End of Session Equipment Utilized During  Treatment: Gait belt Activity Tolerance: Patient limited by fatigue Patient left: in chair;with call bell/phone within reach;with family/visitor present     Time: 1430-1505 PT Time Calculation (min) (ACUTE ONLY): 35 min  Charges:  $Gait Training: 23-37 mins                    G Codes:      [name] PT, [place]PT [phone] Pager: [phone] 01/17/2015, 3:29 PM

## 2015-01-17 NOTE — Consult Note (Signed)
[facility] Consult  Reason for Consult:  Left great toe ulcer Referring Physician:  Dr. Duda MRN #:  [id number]  History of Present Illness: This is a 64 y.o. male who we've been consulted on regarding a left great toe ulcer. The patient reported a 1-2 week history of left great toe pain, swelling and drainage. He had an I & D by Dr. Blackman on 01/13/15. Dr. Duda was then consulted for first ray amputation. ABIs were obtained and revealed non-compressible vessels. He denies any trauma. He has a past medical history of poorly controlled diabetes, hypertension and is s/p renal transplant 6 years ago.  He denies any prior history of non-healing wounds and rest pain. He denies any fever or chills. He gets intermittent cramps in his left hip and calf bilaterally at rest and with exercise, so this does not fit with typical claudication. He is active and ambulates daily. He is a previous smoker quitting 25 years ago.   He has no history of CAD or CVA. On ROS, he denies any numbness or weakness of his extremities, chest pain or shortness of breath. Full ROS below.   Past Medical History  Diagnosis Date  . Renal disorder   . Diabetes mellitus without complication   . CPAP (continuous positive airway pressure) dependence   . Sleep apnea   . Legally blind in left eye, as defined in [place]   . PTSD (post-traumatic stress disorder)   . PVD (peripheral vascular disease) 01/17/2015   Past Surgical History  Procedure Laterality Date  . Combined kidney-pancreas transplant Left 2009  . Incision and drainage abscess Left 01/13/2015    Procedure: INCISION AND DRAINAGE OF LEFT FOOT FIRST RAY ABCESS;  Surgeon: Christopher Y Blackman, MD;  Location: [facility] OR;  Service: Orthopedics;  Laterality: Left;    No Known Allergies  Prior to Admission medications   Medication Sig Start Date End Date Taking? Authorizing Provider  allopurinol (ZYLOPRIM) 300 MG tablet Take 300 mg by mouth 2 (two)  times daily.    Yes Historical Provider, MD  amLODipine (NORVASC) 10 MG tablet Take 10 mg by mouth every morning.   Yes Historical Provider, MD  Chlorpheniramine-DM (CORICIDIN HBP COUGH/COLD PO) Take 1 tablet by mouth 2 (two) times daily as needed (cough).   Yes Historical Provider, MD  docusate sodium (COLACE) 100 MG capsule Take 100 mg by mouth 2 (two) times daily as needed for mild constipation.    Yes Historical Provider, MD  glipiZIDE (GLUCOTROL) 10 MG tablet Take 10 mg by mouth daily before breakfast.   Yes Historical Provider, MD  guaiFENesin-dextromethorphan (ROBITUSSIN DM) 100-10 MG/5ML syrup Take 5 mLs by mouth every 4 (four) hours as needed for cough.   Yes Historical Provider, MD  mirtazapine (REMERON) 15 MG tablet Take 7.5 mg by mouth at bedtime as needed (insomnia).   Yes Historical Provider, MD  mycophenolate (CELLCEPT) 250 MG capsule Take 1,000 mg by mouth 2 (two) times daily.   Yes Historical Provider, MD  prazosin (MINIPRESS) 2 MG capsule Take 6 mg by mouth at bedtime.   Yes Historical Provider, MD  sertraline (ZOLOFT) 100 MG tablet Take 50 mg by mouth at bedtime.   Yes Historical Provider, MD  simvastatin (ZOCOR) 40 MG tablet Take 20 mg by mouth at bedtime.   Yes Historical Provider, MD  tacrolimus (PROGRAF) 1 MG capsule Take 5 mg by mouth 2 (two) times daily.    Yes Historical Provider, MD    History     Social History  . Marital Status: Married    Spouse Name: N/A  . Number of Children: N/A  . Years of Education: N/A   Occupational History  . Not on file.   Social History Main Topics  . Smoking status: Former Smoker  . Smokeless tobacco: Never Used  . Alcohol Use: Yes     Comment: 1 beer rarely   . Drug Use: No  . Sexual Activity: Not on file   Other Topics Concern  . Not on file   Social History Narrative    History reviewed. No pertinent family history.  ROS: [x] Positive   [ ] Negative   [ ] All sytems reviewed and are negative  Cardiovascular: []  chest pain/pressure [] palpitations [] SOB lying flat [] DOE [x] pain in legs while walking [] pain in legs at rest [] pain in legs at night [] non-healing ulcers [] hx of DVT [] swelling in legs  Pulmonary: [] productive cough [] asthma/wheezing [] home O2  Neurologic: [] weakness in [] arms [] legs [] numbness in [] arms [] legs [] hx of CVA [] mini stroke []difficulty speaking or slurred speech [] temporary loss of vision in one eye [] dizziness  Hematologic: [] hx of cancer [] bleeding problems [] problems with blood clotting easily  Endocrine:  [x] diabetes [] thyroid disease  GI [] vomiting blood [] blood in stool  GU: [] CKD/renal failure [] HD--[] M/W/F or [] T/T/S [] burning with urination [] blood in urine  Psychiatric: [] anxiety [] depression  Musculoskeletal: [] arthritis [] joint pain  Integumentary: [] rashes [] ulcers  Constitutional: [] fever [] chills   Physical Examination  Filed Vitals:   01/16/15 2007  BP: 133/60  Pulse: 77  Temp: 97.9 F (36.6 C)  Resp:    Body mass index is 26.19 kg/(m^2).  General:  WDWN in NAD Gait: Not observed HENT: WNL, normocephalic Pulmonary: normal non-labored breathing, without Rales, rhonchi,  wheezing Cardiac: regular, without  Murmurs, rubs or gallops; without carotid bruits Abdomen: soft, NT/ND, no masses Vascular Exam/Pulses: 2+ femoral b/l, 1+ popliteal b/l, non-palpable pedal pulses right foot. Left foot with post-operative dressing. Did not examine ulcer. Did not feel left pedal pulses through dressing.  Extremities: ulcer of left great toe.  Musculoskeletal: no muscle wasting or atrophy  Neurologic: A&O X 3; Appropriate Affect ; SENSATION: normal; MOTOR FUNCTION:  moving all extremities equally. Speech is fluent/normal   CBC    Component Value Date/Time   WBC 7.6 01/17/2015 0832   RBC 4.60 01/17/2015 0832   HGB 12.1* 01/17/2015 0832   HCT 37.4* 01/17/2015 0832   PLT 188  01/17/2015 0832   MCV 81.3 01/17/2015 0832   MCH 26.3 01/17/2015 0832   MCHC 32.4 01/17/2015 0832   RDW 13.7 01/17/2015 0832   LYMPHSABS 1.4 01/12/2015 2317   MONOABS 0.9 01/12/2015 2317   EOSABS 0.1 01/12/2015 2317   BASOSABS 0.0 01/12/2015 2317    BMET    Component Value Date/Time   NA 140 01/17/2015 0832   K 4.2 01/17/2015 0832   CL 105 01/17/2015 0832   CO2 24 01/17/2015 0832   GLUCOSE 187* 01/17/2015 0832   BUN 12 01/17/2015 0832   CREATININE 1.06 01/17/2015 0832   CALCIUM 9.2 01/17/2015 0832   GFRNONAA 73* 01/17/2015 0832   GFRAA 85* 01/17/2015 0832    COAGS: Lab Results  Component Value Date   INR 1.07 10/09/2009    Statin:    Yes.   Beta Blocker:  No. Aspirin:  Yes.   ACEI:  No. ARB:  No. Other antiplatelets/anticoagulants:  No.   ASSESSMENT: This is a 64 y.o. male with ischemic left great toe ulcer.   PLAN: His ABIs reveal non-compressible vessels with monophasic dorsalis pedis and posterior tibial waveforms bilaterally. He is currently on IV ciprofloxacin for wound cultures revealing citrobacter braakii. He is currently stable and his leukocytosis is now normal. He will need an abdominal aortogram with bilateral runoff and possible intervention for further evaluation. Explained that if he has multi-level occlusive disease, he may require surgical revascularization with a bypass procedure. Plan for arteriogram on Monday with Dr. Dickson on Monday.   Kimberly Trinh, PA-C [facility] Office: [phone] Pager: [phone]  Agree with above. Based on his exam, he has evidence of tibial artery occlusive disease.   I have independently interpreted his arterial Doppler study which shows his vessels are noncompressible. He has monophasic signals in the dorsalis pedis and posterior tibial positions bilaterally. Toe pressures could not be obtained.  He has an extensive wound involving the left great toe and ray amputation has been recommended by  Dr. Duda. I have recommended that we proceed with arteriography in order to further assess the circulation and evaluate his options for revascularization. I have reviewed with the patient the indications for arteriography. In addition, I have reviewed the potential complications of arteriography including but not limited to: Bleeding, arterial injury, arterial thrombosis, dye action, renal insufficiency, or other unpredictable medical problems. I have explained to the patient that if we find disease amenable to angioplasty we could potentially address this at the same time. I have discussed the potential complications of angioplasty and stenting, including but not limited to: Bleeding, arterial thrombosis, arterial injury, dissection, or the need for surgical intervention. His arteriogram is scheduled for Monday. I will make further recommendations pending these results.  Christopher Dickson, MD, FACS Beeper [phone] Office: [phone]  

## 2015-01-17 NOTE — Progress Notes (Signed)
[facility] PROGRESS NOTE  [name] ZOX:[id number] DOB: 20-Jan-1951 DOA: 01/12/2015 PCP: No primary care provider on file.  Assessment/Plan: #1 ischemic ulcer left great toe MTP joint/diabetic foot ulcer Patient is currently afebrile. WBC trending down. Wound cultures growing Citrobacter Braakii. Patient is status post irrigation and excisional debridement of left foot abscess. Will discontinue IV Vancomycin. Continue on IV ciprofloxacin. ABIs have been done, and have calcified vessels with monophasic wave forms showing significant peripheral vascular disease. Will consult with vascular surgery per Ortho recommendations to assess to see if any revascularization options are available. Ortho following .Follow.  #2 poorly controlled type 2 diabetes mellitus Hemoglobin A1c is 11.8. CBGs have ranged from 195- 288. Discontinued glipizide yesterday. Increase Lantus to 22 units daily. Continue sliding scale insulin.  #3 significant peripheral vascular disease Per ABIs. Will consult with vascular surgery to assess to see if any revascularization options are available. Risk factor modification. Check a fasting lipid panel. Will place on enteric-coated aspirin.  #4 hypertension Stable. Continue Norvasc, prazosin.  #5 leukocytosis Likely secondary to problem #1. WBC trending down. Continue empiric IV antibiotics.  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 Continue current regimen of tacrolimus and CellCept. Labs pending.  #7 prophylaxis Heparin for DVT prophylaxis.  Code Status: Full Family Communication: Updated patient. No family at bedside. Disposition Plan: Home when medically stable.   Consultants:  Orthopedics: Dr. Lajoyce Corners 01/14/2015  Procedures:  X-ray left foot 01/12/2015  Irrigation and excisional Debridement left foot abscess per Dr. Magnus Ivan 01/13/2015  ABI 01/16/15  Antibiotics:  IV vancomycin 01/13/2015>>>>01/17/15  IV ciprofloxacin 01/16/2015  IV  Zosyn 01/12/2015>>> 01/16/2015  HPI/Subjective: Patient states pain controlled. Patient c/o nausea this morning. No emesis. No CP. No SOB.  Objective: Filed Vitals:   01/16/15 2007  BP: 133/60  Pulse: 77  Temp: 97.9 F (36.6 C)  Resp:     Intake/Output Summary (Last 24 hours) at 01/17/15 1010 Last data filed at 01/16/15 2134  Gross per 24 hour  Intake    950 ml  Output    650 ml  Net    300 ml   Filed Weights   01/13/15 0237  Weight: 82.781 kg (182 lb 8 oz)    Exam:   General:  NAD  Cardiovascular: RRR  Respiratory: CTAB  Abdomen: Soft, nontender, nondistended, positive bowel sounds.  Musculoskeletal: No clubbing cyanosis or edema. Left lower foot bandaged. RUE fistula  Data Reviewed: Basic Metabolic Panel:  Recent Labs Lab 01/12/15 2317 01/13/15 0503 01/15/15 0600  NA 133* 135 138  K 4.5 4.0 4.0  CL 97 100 103  CO2 GLUCOSE 308* 230* 226*  BUN CREATININE 1.01 0.79 0.87  CALCIUM 9.6 9.0 9.1   Liver Function Tests:  Recent Labs Lab 01/12/15 2317 01/15/15 0600  AST 12 11  ALT 9 10  ALKPHOS 85 63  BILITOT 0.9 0.8  PROT 8.0 6.8  ALBUMIN 4.0 3.1*   No results for input(s): LIPASE, AMYLASE in the last 168 hours. No results for input(s): AMMONIA in the last 168 hours. CBC:  Recent Labs Lab 01/12/15 2317 01/13/15 0503 01/15/15 0600 01/17/15 0832  WBC 12.2* 10.3 7.5 7.6  NEUTROABS 9.8*  --   --   --   HGB 13.5 12.0* 11.2* 12.1*  HCT 41.8 37.3* 35.3* 37.4*  MCV 81.8 80.9 81.3 81.3  PLT 192 167 165 188   Cardiac Enzymes: No results for input(s): CKTOTAL, CKMB, CKMBINDEX, TROPONINI in the last  168 hours. BNP (last 3 results) No results for input(s): BNP in the last 8760 hours.  ProBNP (last 3 results) No results for input(s): PROBNP in the last 8760 hours.  CBG:  Recent Labs Lab 01/16/15 0616 01/16/15 1146 01/16/15 1617 01/16/15 2142 01/17/15 0623  GLUCAP 222* 288* 150* 233* 195*    Recent Results (from  the past 240 hour(s))  Surgical pcr screen     Status: None   Collection Time: 01/13/15  2:53 PM  Result Value Ref Range Status   MRSA, PCR NEGATIVE NEGATIVE Final   Staphylococcus aureus NEGATIVE NEGATIVE Final    Comment:        The Xpert SA Assay (FDA approved for NASAL specimens in patients over 62 years of age), is one component of a comprehensive surveillance program.  Test performance has been validated by [facility] for patients greater than or equal to 62 year old. It is not intended to diagnose infection nor to guide or monitor treatment.   Wound culture     Status: None   Collection Time: 01/13/15  4:00 PM  Result Value Ref Range Status   Specimen Description WOUND  Final   Special Requests LEFT FOOT  Final   Gram Stain   Final    MODERATE WBC PRESENT, PREDOMINANTLY PMN FEW SQUAMOUS EPITHELIAL CELLS PRESENT MODERATE GRAM POSITIVE COCCI IN PAIRS IN CHAINS IN CLUSTERS Performed at [facility]    Culture   Final    ABUNDANT CITROBACTER BRAAKII Performed at [facility]    Report Status 01/16/2015 FINAL  Final   Organism ID, Bacteria CITROBACTER BRAAKII  Final      Susceptibility   Citrobacter braakii - MIC*    CEFAZOLIN >=64 RESISTANT Resistant     CEFEPIME <=1 SENSITIVE Sensitive     CEFTAZIDIME <=1 SENSITIVE Sensitive     CEFTRIAXONE <=1 SENSITIVE Sensitive     CIPROFLOXACIN <=0.25 SENSITIVE Sensitive     GENTAMICIN <=1 SENSITIVE Sensitive     IMIPENEM 0.5 SENSITIVE Sensitive     PIP/TAZO <=4 SENSITIVE Sensitive     TOBRAMYCIN <=1 SENSITIVE Sensitive     TRIMETH/SULFA <=20 SENSITIVE Sensitive     * ABUNDANT CITROBACTER BRAAKII     Studies: No results found.  Scheduled Meds: . allopurinol  300 mg Oral BID  . amLODipine  10 mg Oral q morning - 10a  . ciprofloxacin  400 mg Intravenous Q12H  . heparin  5,000 Units Subcutaneous 3 times per day  . insulin aspart  0-9 Units Subcutaneous TID WC  . insulin glargine  16 Units Subcutaneous  Daily  . insulin glargine  25 Units Subcutaneous Once  . mycophenolate  1,000 mg Oral BID  . pantoprazole  40 mg Oral Daily  . prazosin  6 mg Oral QHS  . sertraline  50 mg Oral QHS  . simvastatin  20 mg Oral QHS  . tacrolimus  5 mg Oral BID  . vancomycin  1,000 mg Intravenous Q12H   Continuous Infusions: . sodium chloride 75 mL/hr at 01/13/15 0600  . sodium chloride 75 mL/hr at 01/13/15 1825    Principal Problem:   Cellulitis and abscess of foot excluding toe Active Problems:   Essential hypertension   Cellulitis and abscess of foot   Diabetes mellitus without complication   Leukocytosis   PVD (peripheral vascular disease)    Time spent: 35 mins    [facility]OMPS[facility],Yamaira Spinner MD [facility] Pager [phone]. If 7PM-7AM, please contact night-coverage at [email or web address], password [email or web address]  01/17/2015, 10:10 AM  LOS: 4 days

## 2015-01-17 NOTE — Progress Notes (Signed)
Patient ID: [name], male   DOB: [birth year], 64 y.o.   MRN: [id number] Ankle-brachial indices shows significant peripheral vascular disease with monophasic pulses at the ankle. Would recommend vascular surgery evaluation to see if any revascularization options are available. It is doubtful that patient would heal any type of foot salvage intervention.

## 2015-01-18 LAB — GLUCOSE, CAPILLARY
Glucose-Capillary: 163 mg/dL — ABNORMAL HIGH (ref 70–99)
Glucose-Capillary: 224 mg/dL — ABNORMAL HIGH (ref 70–99)
Glucose-Capillary: 208 mg/dL — ABNORMAL HIGH (ref 70–99)
Glucose-Capillary: 110 mg/dL — ABNORMAL HIGH (ref 70–99)
Glucose-Capillary: 125 mg/dL — ABNORMAL HIGH (ref 70–99)

## 2015-01-18 LAB — CBC
HCT: 38.6 % — ABNORMAL LOW (ref 39.0–52.0)
Hemoglobin: 12.5 g/dL — ABNORMAL LOW (ref 13.0–17.0)
MCH: 26.3 pg (ref 26.0–34.0)
MCHC: 32.4 g/dL (ref 30.0–36.0)
MCV: 81.3 fL (ref 78.0–100.0)
Platelets: 201 10*3/uL (ref 150–400)
RBC: 4.75 MIL/uL (ref 4.22–5.81)
RDW: 13.6 % (ref 11.5–15.5)
WBC: 7.2 10*3/uL (ref 4.0–10.5)

## 2015-01-18 LAB — LIPID PANEL
Cholesterol: 108 mg/dL (ref 0–200)
HDL: 23 mg/dL — ABNORMAL LOW (ref >39)
LDL Cholesterol: 65 mg/dL (ref 0–99)
Total CHOL/HDL Ratio: 4.7 RATIO
Triglycerides: 100 mg/dL (ref <150)
VLDL: 20 mg/dL (ref 0–40)

## 2015-01-18 LAB — RENAL FUNCTION PANEL
Albumin: 3.1 g/dL — ABNORMAL LOW (ref 3.5–5.2)
Anion gap: 9 (ref 5–15)
BUN: 10 mg/dL (ref 6–23)
CO2: 27 mmol/L (ref 19–32)
Calcium: 9.3 mg/dL (ref 8.4–10.5)
Chloride: 103 mmol/L (ref 96–112)
Creatinine, Ser: 1.1 mg/dL (ref 0.50–1.35)
GFR calc Af Amer: 81 mL/min — ABNORMAL LOW (ref >90)
GFR calc non Af Amer: 70 mL/min — ABNORMAL LOW (ref >90)
Glucose, Bld: 172 mg/dL — ABNORMAL HIGH (ref 70–99)
Phosphorus: 2.7 mg/dL (ref 2.3–4.6)
Potassium: 4.5 mmol/L (ref 3.5–5.1)
Sodium: 139 mmol/L (ref 135–145)

## 2015-01-18 MED ORDER — CIPROFLOXACIN HCL 500 MG PO TABS
500.0000 mg | ORAL_TABLET | Freq: Two times a day (BID) | ORAL | Status: DC
  Administered 2015-01-18 – 2015-01-25 (×13): 500 mg via ORAL
  Filled 2015-01-18 (×18): qty 1

## 2015-01-18 MED ORDER — INSULIN ASPART 100 UNIT/ML ~~LOC~~ SOLN
0.0000 [IU] | Freq: Three times a day (TID) | SUBCUTANEOUS | Status: DC
  Administered 2015-01-18: 5 [IU] via SUBCUTANEOUS
  Administered 2015-01-19 (×2): 2 [IU] via SUBCUTANEOUS
  Administered 2015-01-21: 3 [IU] via SUBCUTANEOUS
  Administered 2015-01-22: 5 [IU] via SUBCUTANEOUS
  Administered 2015-01-23: 3 [IU] via SUBCUTANEOUS
  Administered 2015-01-23 – 2015-01-24 (×3): 2 [IU] via SUBCUTANEOUS
  Administered 2015-01-25: 3 [IU] via SUBCUTANEOUS

## 2015-01-18 MED ORDER — INSULIN ASPART 100 UNIT/ML ~~LOC~~ SOLN
0.0000 [IU] | Freq: Every day | SUBCUTANEOUS | Status: DC
  Administered 2015-01-20: 2 [IU] via SUBCUTANEOUS

## 2015-01-18 NOTE — Progress Notes (Signed)
Inpatient Diabetes Program Recommendations  AACE/ADA: New Consensus Statement on Inpatient Glycemic Control (2013)  Target Ranges:  Prepandial:   less than 140 mg/dL      Peak postprandial:   less than 180 mg/dL (1-2 hours)      Critically ill patients:  140 - 180 mg/dL   Recommend increasing Novolog to Moderate scale TID + HS.  Thank you  Piedad ClimesGina Marty Uy BSN, RN,CDE Inpatient Diabetes Coordinator [phone] (team pager)

## 2015-01-18 NOTE — Progress Notes (Addendum)
  [facility] Progress Note  01/18/2015 8:38 AM 5 Days Post-Op  Subjective:  No complaints.   Filed Vitals:   01/18/15 0512  BP: 117/60  Pulse: 75  Temp: 98.1 F (36.7 C)  Resp: 18    Physical Exam: Left foot dressing clean  CBC    Component Value Date/Time   WBC 7.2 01/18/2015 0652   RBC 4.75 01/18/2015 0652   HGB 12.5* 01/18/2015 0652   HCT 38.6* 01/18/2015 0652   PLT 201 01/18/2015 0652   MCV 81.3 01/18/2015 0652   MCH 26.3 01/18/2015 0652   MCHC 32.4 01/18/2015 0652   RDW 13.6 01/18/2015 0652   LYMPHSABS 1.4 01/12/2015 2317   MONOABS 0.9 01/12/2015 2317   EOSABS 0.1 01/12/2015 2317   BASOSABS 0.0 01/12/2015 2317    BMET    Component Value Date/Time   NA 139 01/18/2015 0652   K 4.5 01/18/2015 0652   CL 103 01/18/2015 0652   CO2 27 01/18/2015 0652   GLUCOSE 172* 01/18/2015 0652   BUN 10 01/18/2015 0652   CREATININE 1.10 01/18/2015 0652   CALCIUM 9.3 01/18/2015 0652   GFRNONAA 70* 01/18/2015 0652   GFRAA 81* 01/18/2015 0652    INR    Component Value Date/Time   INR 1.07 10/09/2009 0001      Assessment:  64 y.o. male with ischemic left great toe.   5 Days Post-Op  Plan: -Remains stable. Continue abx. -Plan for arteriogram Monday 01/21/15.    Maris BergerKimberly Trinh, PA-C [facility] Office: [phone] Pager: [phone] 01/18/2015 8:38 AM  Agree with above.  For arteriography Monday.  Waverly Ferrarihristopher Taquita Demby, MD, FACS Beeper [phone] Office: [phone]

## 2015-01-18 NOTE — Care Management Note (Signed)
CARE MANAGEMENT NOTE 01/18/2015  Patient:  [name],[name]   Account Number:  [id number]  Date Initiated:  01/18/2015  Documentation initiated by:  Vance PeperBRADY,Teshaun Olarte  Subjective/Objective Assessment:   64 yr old male admitted with abscess and cellulitis of the left foot. patient underwent I & D of right foot.     Action/Plan:   Case manager spoke with patient concerning DME needs. Provided patient with information for [facility], to locate PCP. Patient states wife will handle.   Anticipated DC Date:  01/21/2015   Anticipated DC Plan:        DC Planning Services  CM consult      PAC Choice  DURABLE MEDICAL EQUIPMENT   Choice offered to / List presented to:     DME arranged  [facility]  3-N-1      DME agency  [facility]     [facility]  NA      Status of service:  Completed, signed off Medicare Important Message given?   (If response is "NO", the following Medicare IM given date fields will be blank) Date Medicare IM given:   Medicare IM given by:   Date Additional Medicare IM given:   Additional Medicare IM given by:    Discharge Disposition:  HOME/SELF CARE  Per UR Regulation:  Reviewed for med. necessity/level of care/duration of stay  If discussed at Long Length of Stay Meetings, dates discussed:

## 2015-01-18 NOTE — Progress Notes (Signed)
Physical Therapy Treatment Patient Details Name: [name] MRN: [id number] DOB: 08/12/1951 Today's Date: 01/18/2015    History of Present Illness Pt is a 64 y/o M s/p irrigation and excisional debridement of L foot abscess on plantar aspect of foot under 1st ray.  Pt's PMH includes PTSD, legally blind in L eye, DM, renal disorder.    PT Comments    Patient is making good progress with PT and demonstrated ability to safely ascend/descend stairs using chair on second step for assist.  Pt reports his landlord will not allow him to build a ramp at home.  Encouraged pt to go ahead and order his knee walker and he said he would be calling to order it today so it will be ready once he is d/c.     Follow Up Recommendations  Supervision for mobility/OOB;No PT follow up     Equipment Recommendations  Rolling walker with 5" wheels;3in1 (PT);Other (comment) (knee walker)    Recommendations for Other Services       Precautions / Restrictions Precautions Precautions: Fall Restrictions Weight Bearing Restrictions: Yes LLE Weight Bearing: Non weight bearing    Mobility  Bed Mobility               General bed mobility comments: sitting EOB upon arrival  Transfers Overall transfer level: Needs assistance Equipment used: Rolling walker (2 wheeled) Transfers: Sit to/from Stand Sit to Stand: Modified independent (Device/Increase time)         General transfer comment: pt performed sit<>stand safely  Ambulation/Gait Ambulation/Gait assistance: Supervision Ambulation Distance (Feet): 220 Feet Assistive device: Rolling walker (2 wheeled) (knee walker) Gait Pattern/deviations: Step-to pattern;Antalgic;Decreased weight shift to left (hop on R foot 6 ft using RW)   Gait velocity interpretation: Below normal speed for age/gender General Gait Details: pt demonstrated excellent control w/ knee walker   Stairs Stairs: Yes Stairs assistance: +2 safety/equipment Stair Management:  No rails;Backwards;Step to pattern;With walker Number of Stairs: 2 General stair comments: Pt hop to 1st step and then sit on chair on second step.  Verbal cues and demonstration prior to attempt.    Wheelchair Mobility    Modified Rankin (Stroke Patients Only)       Balance Overall balance assessment: Needs assistance Sitting-balance support: No upper extremity supported;Feet supported Sitting balance-Leahy Scale: Good     Standing balance support: Bilateral upper extremity supported;During functional activity Standing balance-Leahy Scale: Fair                      Cognition Arousal/Alertness: Awake/alert Behavior During Therapy: WFL for tasks assessed/performed Overall Cognitive Status: Within Functional Limits for tasks assessed                      Exercises Total Joint Exercises Long Arc Quad: AROM;Both;15 reps;Seated    General Comments        Pertinent Vitals/Pain Pain Assessment: No/denies pain    Home Living                      Prior Function            PT Goals (current goals can now be found in the care plan section) Acute Rehab PT Goals Patient Stated Goal: none stated Progress towards PT goals: Progressing toward goals    Frequency  Min 5X/week    PT Plan Current plan remains appropriate    Co-evaluation  End of Session Equipment Utilized During Treatment: Gait belt Activity Tolerance: Patient tolerated treatment well;Patient limited by fatigue Patient left: in chair;with call bell/phone within reach;with family/visitor present     Time: [id number] PT Time Calculation (min) (ACUTE ONLY): 34 min  Charges:  $Gait Training: 23-37 mins                    G Codes:      Michail JewelsAshley Parr PT, TennesseeDPT [phone] Pager: [phone] 01/18/2015, 11:06 AM

## 2015-01-18 NOTE — Progress Notes (Signed)
[facility] PROGRESS NOTE  [name] NWG:[id number] DOB: 1951/05/19 DOA: 01/12/2015 PCP: No primary care provider on file.  Assessment/Plan: #1 ischemic ulcer left great toe MTP joint/diabetic foot ulcer Patient is currently afebrile. WBC trending down. Wound cultures growing Citrobacter Braakii. Patient is status post irrigation and excisional debridement of left foot abscess. Change IV ciprofloxacin to oral ciprofloxacin. ABIs have been done, and have calcified vessels with monophasic wave forms showing significant peripheral vascular disease. Patient has been seen in consultation by vascular surgery and recommended arteriogram on Monday, 01/21/2015 for further evaluation. Vascular and orthopedic surgery following.   #2 poorly controlled type 2 diabetes mellitus Hemoglobin A1c is 11.8. CBGs have ranged from 163- 224. Discontinued glipizide. yesterday. Increase Lantus to 28 units daily. Change sliding scale to moderate.  #3 significant peripheral vascular disease Per ABIs. Patient has been seen in consultation by vascular surgery. His foot patient has an ischemic left great toe and recommending arteriogram on Monday, 01/21/2015. Continue risk factor modification. Continue enteric-coated aspirin. Fasting lipid panel with LDL of 65. Continue aspirin.   #4 hypertension Stable. Continue Norvasc, prazosin.  #5 leukocytosis Likely secondary to problem #1. WBC trending down. Change empiric IV antibiotics to oral antibiotics..  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 Continue current regimen of tacrolimus and CellCept.   #7 prophylaxis Heparin for DVT prophylaxis.  Code Status: Full Family Communication: Updated patient. No family at bedside. Disposition Plan: Home when medically stable.   Consultants:  Orthopedics: Dr. Lajoyce Corners 01/14/2015  Vascular surgery: Dr. Waverly Ferrari 01/17/2015  Procedures:  X-ray left foot 01/12/2015  Irrigation and excisional  Debridement left foot abscess per Dr. Magnus Ivan 01/13/2015  ABI 01/16/15  Antibiotics:  IV vancomycin 01/13/2015>>>>01/17/15  IV ciprofloxacin 01/16/2015>>>>01/18/15  IV Zosyn 01/12/2015>>> 01/16/2015  Oral ciprofloxacin 01/18/2015  HPI/Subjective: Patient states pain controlled. Patient denies nausea. No emesis. No CP. No SOB.  Objective: Filed Vitals:   01/18/15 0512  BP: 117/60  Pulse: 75  Temp: 98.1 F (36.7 C)  Resp: 18   No intake or output data in the 24 hours ending 01/18/15 1149 Filed Weights   01/13/15 0237  Weight: 82.781 kg (182 lb 8 oz)    Exam:   General:  NAD  Cardiovascular: RRR  Respiratory: CTAB  Abdomen: Soft, nontender, nondistended, positive bowel sounds.  Musculoskeletal: No clubbing cyanosis or edema. Left lower foot bandaged. RUE fistula  Data Reviewed: Basic Metabolic Panel:  Recent Labs Lab 01/12/15 2317 01/13/15 0503 01/15/15 0600 01/17/15 0832 01/18/15 0652  NA 133* 135 138 140 139  K 4.5 4.0 4.0 4.2 4.5  CL 97 100 103 105 103  CO2 GLUCOSE 308* 230* 226* 187* 172*  BUN CREATININE 1.01 0.79 0.87 1.06 1.10  CALCIUM 9.6 9.0 9.1 9.2 9.3  PHOS  --   --   --   --  2.7   Liver Function Tests:  Recent Labs Lab 01/12/15 2317 01/15/15 0600 01/18/15 0652  AST 12 11  --   ALT 9 10  --   ALKPHOS 85 63  --   BILITOT 0.9 0.8  --   PROT 8.0 6.8  --   ALBUMIN 4.0 3.1* 3.1*   No results for input(s): LIPASE, AMYLASE in the last 168 hours. No results for input(s): AMMONIA in the last 168 hours. CBC:  Recent Labs Lab 01/12/15 2317 01/13/15 0503 01/15/15 0600 01/17/15 0832 01/18/15 0652  WBC 12.2* 10.3 7.5 7.6 7.2  NEUTROABS 9.8*  --   --   --   --   HGB 13.5 12.0* 11.2* 12.1* 12.5*  HCT 41.8 37.3* 35.3* 37.4* 38.6*  MCV 81.8 80.9 81.3 81.3 81.3  PLT 192 167 165 188 201   Cardiac Enzymes: No results for input(s): CKTOTAL, CKMB, CKMBINDEX, TROPONINI in the last 168 hours. BNP (last 3  results) No results for input(s): BNP in the last 8760 hours.  ProBNP (last 3 results) No results for input(s): PROBNP in the last 8760 hours.  CBG:  Recent Labs Lab 01/17/15 0623 01/17/15 1156 01/17/15 1650 01/17/15 2145 01/18/15 0633  GLUCAP 195* 127* 206* 216* 163*    Recent Results (from the past 240 hour(s))  Surgical pcr screen     Status: None   Collection Time: 01/13/15  2:53 PM  Result Value Ref Range Status   MRSA, PCR NEGATIVE NEGATIVE Final   Staphylococcus aureus NEGATIVE NEGATIVE Final    Comment:        The Xpert SA Assay (FDA approved for NASAL specimens in patients over 62 years of age), is one component of a comprehensive surveillance program.  Test performance has been validated by [facility] for patients greater than or equal to 62 year old. It is not intended to diagnose infection nor to guide or monitor treatment.   Wound culture     Status: None   Collection Time: 01/13/15  4:00 PM  Result Value Ref Range Status   Specimen Description WOUND  Final   Special Requests LEFT FOOT  Final   Gram Stain   Final    MODERATE WBC PRESENT, PREDOMINANTLY PMN FEW SQUAMOUS EPITHELIAL CELLS PRESENT MODERATE GRAM POSITIVE COCCI IN PAIRS IN CHAINS IN CLUSTERS [facility]    Culture   Final    ABUNDANT CITROBACTER BRAAKII Performed at [facility]    Report Status 01/16/2015 FINAL  Final   Organism ID, Bacteria CITROBACTER BRAAKII  Final      Susceptibility   Citrobacter braakii - MIC*    CEFAZOLIN >=64 RESISTANT Resistant     CEFEPIME <=1 SENSITIVE Sensitive     CEFTAZIDIME <=1 SENSITIVE Sensitive     CEFTRIAXONE <=1 SENSITIVE Sensitive     CIPROFLOXACIN <=0.25 SENSITIVE Sensitive     GENTAMICIN <=1 SENSITIVE Sensitive     IMIPENEM 0.5 SENSITIVE Sensitive     PIP/TAZO <=4 SENSITIVE Sensitive     TOBRAMYCIN <=1 SENSITIVE Sensitive     TRIMETH/SULFA <=20 SENSITIVE Sensitive     * ABUNDANT CITROBACTER BRAAKII      Studies: No results found.  Scheduled Meds: . allopurinol  300 mg Oral BID  . amLODipine  10 mg Oral q morning - 10a  . aspirin EC  81 mg Oral Daily  . ciprofloxacin  400 mg Intravenous Q12H  . heparin  5,000 Units Subcutaneous 3 times per day  . insulin aspart  0-9 Units Subcutaneous TID WC  . insulin glargine  16 Units Subcutaneous Daily  . insulin glargine  25 Units Subcutaneous Once  . mycophenolate  1,000 mg Oral BID  . pantoprazole  40 mg Oral Daily  . prazosin  6 mg Oral QHS  . sertraline  50 mg Oral QHS  . simvastatin  20 mg Oral QHS  . tacrolimus  5 mg Oral BID   Continuous Infusions: . sodium chloride 75 mL/hr at 01/13/15 0600  . sodium chloride 75 mL/hr at 01/13/15 1825    Principal Problem:   Cellulitis and  abscess of foot excluding toe Active Problems:   Essential hypertension   Cellulitis and abscess of foot   Diabetes mellitus without complication   Leukocytosis   PVD (peripheral vascular disease)    Time spent: 35 mins    [facility] MD [facility] Pager [phone]. If 7PM-7AM, please contact night-coverage at [email or web address], password [facility] 01/18/2015, 11:49 AM  LOS: 5 days

## 2015-01-19 DIAGNOSIS — L97529 Non-pressure chronic ulcer of other part of left foot with unspecified severity: Secondary | ICD-10-CM

## 2015-01-19 DIAGNOSIS — I739 Peripheral vascular disease, unspecified: Secondary | ICD-10-CM | POA: Insufficient documentation

## 2015-01-19 DIAGNOSIS — L97509 Non-pressure chronic ulcer of other part of unspecified foot with unspecified severity: Secondary | ICD-10-CM | POA: Insufficient documentation

## 2015-01-19 LAB — BASIC METABOLIC PANEL
Anion gap: 12 (ref 5–15)
BUN: 13 mg/dL (ref 6–23)
CO2: 24 mmol/L (ref 19–32)
Calcium: 9.4 mg/dL (ref 8.4–10.5)
Chloride: 104 mmol/L (ref 96–112)
Creatinine, Ser: 1.21 mg/dL (ref 0.50–1.35)
GFR calc Af Amer: 72 mL/min — ABNORMAL LOW (ref >90)
GFR calc non Af Amer: 62 mL/min — ABNORMAL LOW (ref >90)
Glucose, Bld: 132 mg/dL — ABNORMAL HIGH (ref 70–99)
Potassium: 4.8 mmol/L (ref 3.5–5.1)
Sodium: 140 mmol/L (ref 135–145)

## 2015-01-19 LAB — CBC
HCT: 38.5 % — ABNORMAL LOW (ref 39.0–52.0)
Hemoglobin: 12.3 g/dL — ABNORMAL LOW (ref 13.0–17.0)
MCH: 26.4 pg (ref 26.0–34.0)
MCHC: 31.9 g/dL (ref 30.0–36.0)
MCV: 82.6 fL (ref 78.0–100.0)
Platelets: 181 10*3/uL (ref 150–400)
RBC: 4.66 MIL/uL (ref 4.22–5.81)
RDW: 14 % (ref 11.5–15.5)
WBC: 7.8 10*3/uL (ref 4.0–10.5)

## 2015-01-19 LAB — GLUCOSE, CAPILLARY
Glucose-Capillary: 132 mg/dL — ABNORMAL HIGH (ref 70–99)
Glucose-Capillary: 144 mg/dL — ABNORMAL HIGH (ref 70–99)
Glucose-Capillary: 100 mg/dL — ABNORMAL HIGH (ref 70–99)
Glucose-Capillary: 139 mg/dL — ABNORMAL HIGH (ref 70–99)

## 2015-01-19 MED ORDER — BENZONATATE 100 MG PO CAPS
100.0000 mg | ORAL_CAPSULE | Freq: Three times a day (TID) | ORAL | Status: DC | PRN
  Administered 2015-01-19: 100 mg via ORAL
  Filled 2015-01-19 (×2): qty 1

## 2015-01-19 NOTE — Progress Notes (Signed)
      Pre-op for Mon angiogram  Summerfield[place]LLINS, [name] PA-C

## 2015-01-19 NOTE — Progress Notes (Signed)
[facility] PROGRESS NOTE  [name] OZH:[id number] DOB: 1951-07-28 DOA: 01/12/2015 PCP: No primary care provider on file.  Assessment/Plan: #1 ischemic ulcer left great toe MTP joint/diabetic foot ulcer Patient is currently afebrile. WBC trending down. Wound cultures growing Citrobacter Braakii. Patient is status post irrigation and excisional debridement of left foot abscess. Continue oral ciprofloxacin. ABIs have been done, and have calcified vessels with monophasic wave forms showing significant peripheral vascular disease. Patient has been seen in consultation by vascular surgery and recommended arteriogram on Monday, 01/21/2015 for further evaluation. Vascular and orthopedic surgery following.   #2 poorly controlled type 2 diabetes mellitus Hemoglobin A1c is 11.8. CBGs have ranged from 125- 144. Discontinued glipizide. yesterday. Continue Lantus 16 units daily. Continue SSI.   #3 significant peripheral vascular disease Per ABIs. Patient has been seen in consultation by vascular surgery. Patient has an ischemic left great toe and vascular surgery recommending arteriogram on Monday, 01/21/2015. Continue risk factor modification. Continue enteric-coated aspirin. Fasting lipid panel with LDL of 65.  #4 hypertension Stable. Continue Norvasc, prazosin.  #5 leukocytosis Likely secondary to problem #1. WBC trending down. Continue oral ciprofloxacin.  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 Continue current regimen of tacrolimus and CellCept.   #7 prophylaxis Heparin for DVT prophylaxis.  Code Status: Full Family Communication: Updated patient. No family at bedside. Disposition Plan: Pending evaluation for resvascularization.   Consultants:  Orthopedics: Dr. Lajoyce Corners 01/14/2015  Vascular surgery: Dr. Waverly Ferrari 01/17/2015  Procedures:  X-ray left foot 01/12/2015  Irrigation and excisional Debridement left foot abscess per Dr. Magnus Ivan  01/13/2015  ABI 01/16/15  Antibiotics:  IV vancomycin 01/13/2015>>>>01/17/15  IV ciprofloxacin 01/16/2015>>>>01/18/15  IV Zosyn 01/12/2015>>> 01/16/2015  Oral ciprofloxacin 01/18/2015  HPI/Subjective: Patient denies, nausea, emesis, SOB. Pain controlled.  Objective: Filed Vitals:   01/19/15 0538  BP: 130/53  Pulse: 69  Temp: 98.3 F (36.8 C)  Resp: 18    Intake/Output Summary (Last 24 hours) at 01/19/15 0948 Last data filed at 01/19/15 6295  Gross per 24 hour  Intake    240 ml  Output    450 ml  Net   -210 ml   Filed Weights   01/13/15 0237  Weight: 82.781 kg (182 lb 8 oz)    Exam:   General:  NAD  Cardiovascular: RRR  Respiratory: CTAB  Abdomen: Soft, nontender, nondistended, positive bowel sounds.  Musculoskeletal: No clubbing cyanosis or edema. Left lower foot bandaged. RUE fistula  Data Reviewed: Basic Metabolic Panel:  Recent Labs Lab 01/13/15 0503 01/15/15 0600 01/17/15 0832 01/18/15 0652 01/19/15 0549  NA 135 138 140 139 140  K 4.0 4.0 4.2 4.5 4.8  CL 100 103 105 103 104  CO2 GLUCOSE 230* 226* 187* 172* 132*  BUN CREATININE 0.79 0.87 1.06 1.10 1.21  CALCIUM 9.0 9.1 9.2 9.3 9.4  PHOS  --   --   --  2.7  --    Liver Function Tests:  Recent Labs Lab 01/12/15 2317 01/15/15 0600 01/18/15 0652  AST 12 11  --   ALT 9 10  --   ALKPHOS 85 63  --   BILITOT 0.9 0.8  --   PROT 8.0 6.8  --   ALBUMIN 4.0 3.1* 3.1*   No results for input(s): LIPASE, AMYLASE in the last 168 hours. No results for input(s): AMMONIA in the last 168 hours. CBC:  Recent Labs Lab 01/12/15 2317 01/13/15 0503 01/15/15 0600  01/17/15 0832 01/18/15 0652 01/19/15 0549  WBC 12.2* 10.3 7.5 7.6 7.2 7.8  NEUTROABS 9.8*  --   --   --   --   --   HGB 13.5 12.0* 11.2* 12.1* 12.5* 12.3*  HCT 41.8 37.3* 35.3* 37.4* 38.6* 38.5*  MCV 81.8 80.9 81.3 81.3 81.3 82.6  PLT 192 167 165 188 201 181   Cardiac Enzymes: No results for  input(s): CKTOTAL, CKMB, CKMBINDEX, TROPONINI in the last 168 hours. BNP (last 3 results) No results for input(s): BNP in the last 8760 hours.  ProBNP (last 3 results) No results for input(s): PROBNP in the last 8760 hours.  CBG:  Recent Labs Lab 01/18/15 1152 01/18/15 1420 01/18/15 1703 01/18/15 2116 01/19/15 0618  GLUCAP 224* 208* 110* 125* 132*    Recent Results (from the past 240 hour(s))  Surgical pcr screen     Status: None   Collection Time: 01/13/15  2:53 PM  Result Value Ref Range Status   MRSA, PCR NEGATIVE NEGATIVE Final   Staphylococcus aureus NEGATIVE NEGATIVE Final    Comment:        The Xpert SA Assay (FDA approved for NASAL specimens in patients over 72 years of age), is one component of a comprehensive surveillance program.  Test performance has been validated by [facility] for patients greater than or equal to 72 year old. It is not intended to diagnose infection nor to guide or monitor treatment.   Wound culture     Status: None   Collection Time: 01/13/15  4:00 PM  Result Value Ref Range Status   Specimen Description WOUND  Final   Special Requests LEFT FOOT  Final   Gram Stain   Final    MODERATE WBC PRESENT, PREDOMINANTLY PMN FEW SQUAMOUS EPITHELIAL CELLS PRESENT MODERATE GRAM POSITIVE COCCI IN PAIRS IN CHAINS IN CLUSTERS Performed at [facility]    Culture   Final    ABUNDANT CITROBACTER BRAAKII Performed at [facility]    Report Status 01/16/2015 FINAL  Final   Organism ID, Bacteria CITROBACTER BRAAKII  Final      Susceptibility   Citrobacter braakii - MIC*    CEFAZOLIN >=64 RESISTANT Resistant     CEFEPIME <=1 SENSITIVE Sensitive     CEFTAZIDIME <=1 SENSITIVE Sensitive     CEFTRIAXONE <=1 SENSITIVE Sensitive     CIPROFLOXACIN <=0.25 SENSITIVE Sensitive     GENTAMICIN <=1 SENSITIVE Sensitive     IMIPENEM 0.5 SENSITIVE Sensitive     PIP/TAZO <=4 SENSITIVE Sensitive     TOBRAMYCIN <=1 SENSITIVE Sensitive      TRIMETH/SULFA <=20 SENSITIVE Sensitive     * ABUNDANT CITROBACTER BRAAKII     Studies: No results found.  Scheduled Meds: . allopurinol  300 mg Oral BID  . amLODipine  10 mg Oral q morning - 10a  . aspirin EC  81 mg Oral Daily  . ciprofloxacin  500 mg Oral BID  . heparin  5,000 Units Subcutaneous 3 times per day  . insulin aspart  0-15 Units Subcutaneous TID WC  . insulin aspart  0-5 Units Subcutaneous QHS  . insulin glargine  16 Units Subcutaneous Daily  . insulin glargine  25 Units Subcutaneous Once  . mycophenolate  1,000 mg Oral BID  . pantoprazole  40 mg Oral Daily  . prazosin  6 mg Oral QHS  . sertraline  50 mg Oral QHS  . simvastatin  20 mg Oral QHS  . tacrolimus  5 mg Oral  BID   Continuous Infusions:    Principal Problem:   Cellulitis and abscess of foot excluding toe Active Problems:   Essential hypertension   Cellulitis and abscess of foot   Diabetes mellitus without complication   Leukocytosis   PVD (peripheral vascular disease)    Time spent: 35 mins    [facility], Cooper[place]OMPSON,DANIEL MD [facility] Pager [phone]. If 7PM-7AM, please contact night-coverage at [email or web address], password [email or web address] 01/19/2015, 9:48 AM  LOS: 6 days

## 2015-01-20 LAB — BASIC METABOLIC PANEL
Anion gap: 11 (ref 5–15)
BUN: 15 mg/dL (ref 6–23)
CO2: 25 mmol/L (ref 19–32)
Calcium: 9.5 mg/dL (ref 8.4–10.5)
Chloride: 103 mmol/L (ref 96–112)
Creatinine, Ser: 1.33 mg/dL (ref 0.50–1.35)
GFR calc Af Amer: 65 mL/min — ABNORMAL LOW (ref >90)
GFR calc non Af Amer: 56 mL/min — ABNORMAL LOW (ref >90)
Glucose, Bld: 131 mg/dL — ABNORMAL HIGH (ref 70–99)
Potassium: 4.5 mmol/L (ref 3.5–5.1)
Sodium: 139 mmol/L (ref 135–145)

## 2015-01-20 LAB — GLUCOSE, CAPILLARY
Glucose-Capillary: 119 mg/dL — ABNORMAL HIGH (ref 70–99)
Glucose-Capillary: 132 mg/dL — ABNORMAL HIGH (ref 70–99)
Glucose-Capillary: 163 mg/dL — ABNORMAL HIGH (ref 70–99)
Glucose-Capillary: 204 mg/dL — ABNORMAL HIGH (ref 70–99)

## 2015-01-20 NOTE — Progress Notes (Signed)
[facility] PROGRESS NOTE  Chau Sawin AVW:[id number] DOB: 1951/08/22 DOA: 01/12/2015 PCP: No primary care provider on file.  Assessment/Plan: #1 ischemic ulcer left great toe MTP joint/diabetic foot ulcer Patient is currently afebrile. WBC trending down. Wound cultures growing Citrobacter Braakii. Patient is status post irrigation and excisional debridement of left foot abscess. Continue oral ciprofloxacin. ABIs have been done, and have calcified vessels with monophasic wave forms showing significant peripheral vascular disease. Patient has been seen in consultation by vascular surgery and recommended arteriogram on Monday, 01/21/2015 for further evaluation. Vascular and orthopedic surgery following.   #2 poorly controlled type 2 diabetes mellitus Hemoglobin A1c is 11.8. CBGs have ranged from 119- 163. Continue Lantus 16 units daily. Continue SSI.   #3 significant peripheral vascular disease Per ABIs. Patient has been seen in consultation by vascular surgery. Patient has an ischemic left great toe and vascular surgery recommending arteriogram on Monday, 01/21/2015. Continue risk factor modification. Continue enteric-coated aspirin. Fasting lipid panel with LDL of 65.Vascular ff.  #4 hypertension Stable. Continue Norvasc, prazosin.  #5 leukocytosis Likely secondary to problem #1. WBC trending down. Continue oral ciprofloxacin.  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 Continue current regimen of tacrolimus and CellCept. Anti emetics prn.  #7 prophylaxis Heparin for DVT prophylaxis.  Code Status: Full Family Communication: Updated patient. No family at bedside. Disposition Plan: Pending evaluation for resvascularization.   Consultants:  Orthopedics: Dr. Lajoyce Corners 01/14/2015  Vascular surgery: Dr. Waverly Ferrari 01/17/2015  Procedures:  X-ray left foot 01/12/2015  Irrigation and excisional Debridement left foot abscess per Dr. Magnus Ivan 01/13/2015  ABI  01/16/15  Antibiotics:  IV vancomycin 01/13/2015>>>>01/17/15  IV ciprofloxacin 01/16/2015>>>>01/18/15  IV Zosyn 01/12/2015>>> 01/16/2015  Oral ciprofloxacin 01/18/2015  HPI/Subjective: Patient c/o nausea and feels it is from his anti-rejection medications. Patient states decreased appetite.  Objective: Filed Vitals:   01/20/15 0454  BP: 146/41  Pulse: 89  Temp: 99.2 F (37.3 C)  Resp: 17    Intake/Output Summary (Last 24 hours) at 01/20/15 1500 Last data filed at 01/20/15 0837  Gross per 24 hour  Intake    240 ml  Output    400 ml  Net   -160 ml   Filed Weights   01/13/15 0237  Weight: 82.781 kg (182 lb 8 oz)    Exam:   General:  NAD  Cardiovascular: RRR  Respiratory: CTAB  Abdomen: Soft, nontender, nondistended, positive bowel sounds.  Musculoskeletal: No clubbing cyanosis or edema. Left lower foot bandaged. RUE fistula  Data Reviewed: Basic Metabolic Panel:  Recent Labs Lab 01/15/15 0600 01/17/15 0832 01/18/15 0652 01/19/15 0549 01/20/15 0525  NA 138 140 139 140 139  K 4.0 4.2 4.5 4.8 4.5  CL 103 105 103 104 103  CO2 GLUCOSE 226* 187* 172* 132* 131*  BUN CREATININE 0.87 1.06 1.10 1.21 1.33  CALCIUM 9.1 9.2 9.3 9.4 9.5  PHOS  --   --  2.7  --   --    Liver Function Tests:  Recent Labs Lab 01/15/15 0600 01/18/15 0652  AST 11  --   ALT 10  --   ALKPHOS 63  --   BILITOT 0.8  --   PROT 6.8  --   ALBUMIN 3.1* 3.1*   No results for input(s): LIPASE, AMYLASE in the last 168 hours. No results for input(s): AMMONIA in the last 168 hours. CBC:  Recent Labs Lab 01/15/15 0600 01/17/15 8295 01/18/15 6213  01/19/15 0549  WBC 7.5 7.6 7.2 7.8  HGB 11.2* 12.1* 12.5* 12.3*  HCT 35.3* 37.4* 38.6* 38.5*  MCV 81.3 81.3 81.3 82.6  PLT 165 188 201 181   Cardiac Enzymes: No results for input(s): CKTOTAL, CKMB, CKMBINDEX, TROPONINI in the last 168 hours. BNP (last 3 results) No results for input(s): BNP in the  last 8760 hours.  ProBNP (last 3 results) No results for input(s): PROBNP in the last 8760 hours.  CBG:  Recent Labs Lab 01/19/15 1132 01/19/15 1640 01/19/15 2140 01/20/15 0607 01/20/15 1113  GLUCAP 144* 100* 139* 119* 132*    Recent Results (from the past 240 hour(s))  Surgical pcr screen     Status: None   Collection Time: 01/13/15  2:53 PM  Result Value Ref Range Status   MRSA, PCR NEGATIVE NEGATIVE Final   Staphylococcus aureus NEGATIVE NEGATIVE Final    Comment:        The Xpert SA Assay (FDA approved for NASAL specimens in patients over 62 years of age), is one component of a comprehensive surveillance program.  Test performance has been validated by [facility] for patients greater than or equal to 62 year old. It is not intended to diagnose infection nor to guide or monitor treatment.   Wound culture     Status: None   Collection Time: 01/13/15  4:00 PM  Result Value Ref Range Status   Specimen Description WOUND  Final   Special Requests LEFT FOOT  Final   Gram Stain   Final    MODERATE WBC PRESENT, PREDOMINANTLY PMN FEW SQUAMOUS EPITHELIAL CELLS PRESENT MODERATE GRAM POSITIVE COCCI IN PAIRS IN CHAINS IN CLUSTERS Performed at [facility]    Culture   Final    ABUNDANT CITROBACTER BRAAKII Performed at [facility]    Report Status 01/16/2015 FINAL  Final   Organism ID, Bacteria CITROBACTER BRAAKII  Final      Susceptibility   Citrobacter braakii - MIC*    CEFAZOLIN >=64 RESISTANT Resistant     CEFEPIME <=1 SENSITIVE Sensitive     CEFTAZIDIME <=1 SENSITIVE Sensitive     CEFTRIAXONE <=1 SENSITIVE Sensitive     CIPROFLOXACIN <=0.25 SENSITIVE Sensitive     GENTAMICIN <=1 SENSITIVE Sensitive     IMIPENEM 0.5 SENSITIVE Sensitive     PIP/TAZO <=4 SENSITIVE Sensitive     TOBRAMYCIN <=1 SENSITIVE Sensitive     TRIMETH/SULFA <=20 SENSITIVE Sensitive     * ABUNDANT CITROBACTER BRAAKII     Studies: No results found.  Scheduled  Meds: . allopurinol  300 mg Oral BID  . amLODipine  10 mg Oral q morning - 10a  . aspirin EC  81 mg Oral Daily  . ciprofloxacin  500 mg Oral BID  . heparin  5,000 Units Subcutaneous 3 times per day  . insulin aspart  0-15 Units Subcutaneous TID WC  . insulin aspart  0-5 Units Subcutaneous QHS  . insulin glargine  16 Units Subcutaneous Daily  . insulin glargine  25 Units Subcutaneous Once  . mycophenolate  1,000 mg Oral BID  . pantoprazole  40 mg Oral Daily  . prazosin  6 mg Oral QHS  . sertraline  50 mg Oral QHS  . simvastatin  20 mg Oral QHS  . tacrolimus  5 mg Oral BID   Continuous Infusions:    Principal Problem:   Cellulitis and abscess of foot excluding toe Active Problems:   Essential hypertension   Cellulitis and abscess of foot  Diabetes mellitus without complication   Leukocytosis   PVD (peripheral vascular disease)   Ischemic ulcer of toe   Peripheral vascular disease    Time spent: 35 mins    [facility] MD [facility] Pager [phone]. If 7PM-7AM, please contact night-coverage at [email or web address], password [facility] 01/20/2015, 3:00 PM  LOS: 7 days

## 2015-01-21 ENCOUNTER — Encounter (HOSPITAL_COMMUNITY)
Admission: EM | Disposition: A | Discharge status: Home Health | Payer: Self-pay | Source: Home / Self Care | Attending: Internal Medicine

## 2015-01-21 ENCOUNTER — Encounter (HOSPITAL_COMMUNITY): Payer: Self-pay | Admitting: Vascular Surgery

## 2015-01-21 HISTORY — PX: LOWER EXTREMITY ANGIOGRAM: SHX5508

## 2015-01-21 HISTORY — PX: ABDOMINAL AORTAGRAM: SHX5454

## 2015-01-21 LAB — BASIC METABOLIC PANEL
Anion gap: 11 (ref 5–15)
BUN: 16 mg/dL (ref 6–23)
CO2: 25 mmol/L (ref 19–32)
Calcium: 9.6 mg/dL (ref 8.4–10.5)
Chloride: 100 mmol/L (ref 96–112)
Creatinine, Ser: 1.3 mg/dL (ref 0.50–1.35)
GFR calc Af Amer: 66 mL/min — ABNORMAL LOW (ref >90)
GFR calc non Af Amer: 57 mL/min — ABNORMAL LOW (ref >90)
Glucose, Bld: 141 mg/dL — ABNORMAL HIGH (ref 70–99)
Potassium: 4.4 mmol/L (ref 3.5–5.1)
Sodium: 136 mmol/L (ref 135–145)

## 2015-01-21 LAB — GLUCOSE, CAPILLARY
Glucose-Capillary: 144 mg/dL — ABNORMAL HIGH (ref 70–99)
Glucose-Capillary: 134 mg/dL — ABNORMAL HIGH (ref 70–99)
Glucose-Capillary: 151 mg/dL — ABNORMAL HIGH (ref 70–99)
Glucose-Capillary: 134 mg/dL — ABNORMAL HIGH (ref 70–99)

## 2015-01-21 LAB — CBC
HCT: 38.8 % — ABNORMAL LOW (ref 39.0–52.0)
Hemoglobin: 12.6 g/dL — ABNORMAL LOW (ref 13.0–17.0)
MCH: 26.4 pg (ref 26.0–34.0)
MCHC: 32.5 g/dL (ref 30.0–36.0)
MCV: 81.3 fL (ref 78.0–100.0)
Platelets: 199 10*3/uL (ref 150–400)
RBC: 4.77 MIL/uL (ref 4.22–5.81)
RDW: 13.9 % (ref 11.5–15.5)
WBC: 6.6 10*3/uL (ref 4.0–10.5)

## 2015-01-21 SURGERY — ABDOMINAL AORTAGRAM
Anesthesia: LOCAL

## 2015-01-21 MED ORDER — SODIUM CHLORIDE 0.9 % IV SOLN: 1.0000 mL/kg/h | INTRAVENOUS | Status: AC

## 2015-01-21 MED ORDER — SODIUM CHLORIDE 0.9 % IV SOLN: INTRAVENOUS | Status: DC

## 2015-01-21 MED ORDER — ENOXAPARIN SODIUM 40 MG/0.4ML ~~LOC~~ SOLN
40.0000 mg | SUBCUTANEOUS | Status: DC
  Administered 2015-01-22 – 2015-01-25 (×3): 40 mg via SUBCUTANEOUS
  Filled 2015-01-21 (×5): qty 0.4

## 2015-01-21 MED ORDER — LIDOCAINE HCL (PF) 1 % IJ SOLN
INTRAMUSCULAR | Status: AC
  Filled 2015-01-21: qty 30

## 2015-01-21 MED ORDER — ACETAMINOPHEN 650 MG RE SUPP: 650.0000 mg | Freq: Four times a day (QID) | RECTAL | Status: DC | PRN

## 2015-01-21 MED ORDER — ONDANSETRON HCL 4 MG/2ML IJ SOLN: 4.0000 mg | Freq: Four times a day (QID) | INTRAMUSCULAR | Status: DC | PRN

## 2015-01-21 MED ORDER — FENTANYL CITRATE (PF) 100 MCG/2ML IJ SOLN
INTRAMUSCULAR | Status: AC
  Filled 2015-01-21: qty 2

## 2015-01-21 MED ORDER — HEPARIN (PORCINE) IN NACL 2-0.9 UNIT/ML-% IJ SOLN
INTRAMUSCULAR | Status: AC
  Filled 2015-01-21: qty 1000

## 2015-01-21 MED ORDER — ONDANSETRON HCL 4 MG/2ML IJ SOLN
INTRAMUSCULAR | Status: AC
  Filled 2015-01-21: qty 2

## 2015-01-21 MED ORDER — ACETAMINOPHEN 325 MG PO TABS: 650.0000 mg | ORAL_TABLET | ORAL | Status: DC | PRN

## 2015-01-21 MED ORDER — MIDAZOLAM HCL 2 MG/2ML IJ SOLN
INTRAMUSCULAR | Status: AC
  Filled 2015-01-21: qty 2

## 2015-01-21 MED ORDER — ACETAMINOPHEN 325 MG PO TABS
650.0000 mg | ORAL_TABLET | Freq: Four times a day (QID) | ORAL | Status: DC | PRN
  Administered 2015-01-23: 650 mg via ORAL

## 2015-01-21 NOTE — Progress Notes (Signed)
[facility] PROGRESS NOTE  [name] BMW:[id number] DOB: 1951/05/27 DOA: 01/12/2015 PCP: No primary care provider on file.  Assessment/Plan: #1 ischemic ulcer left great toe MTP joint/diabetic foot ulcer Patient is currently afebrile. WBC trending down. Wound cultures growing Citrobacter Braakii. Patient is status post irrigation and excisional debridement of left foot abscess. Continue oral ciprofloxacin. ABIs have been done, and have calcified vessels with monophasic wave forms showing significant peripheral vascular disease. Patient has been seen in consultation by vascular surgery and patient for arteriogram today, Monday 01/21/2015 for further evaluation. Vascular and orthopedic surgery following. Continue antibiotics.   #2 poorly controlled type 2 diabetes mellitus Hemoglobin A1c is 11.8. CBGs have ranged from 144- 204. Continue Lantus 16 units daily. Continue SSI.   #3 significant peripheral vascular disease Per ABIs. Patient has been seen in consultation by vascular surgery. Patient has an ischemic left great toe and vascular surgery recommended arteriogram today, Monday 01/21/2015. Continue risk factor modification. Continue enteric-coated aspirin. Fasting lipid panel with LDL of 65.Vascular ff.  #4 hypertension Stable. Continue Norvasc, prazosin.  #5 leukocytosis Likely secondary to problem #1. WBC trended down. Continue oral ciprofloxacin.  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 Continue current regimen of tacrolimus and CellCept. Anti emetics prn.  #7 prophylaxis Heparin for DVT prophylaxis.  Code Status: Full Family Communication: Updated patient. No family at bedside. Disposition Plan: Pending evaluation for resvascularization.   Consultants:  Orthopedics: Dr. Lajoyce Cornersuda 01/14/2015  Vascular surgery: Dr. Waverly Ferrarihristopher Dickson 01/17/2015  Procedures:  X-ray left foot 01/12/2015  Irrigation and excisional Debridement left foot abscess per Dr.  Magnus IvanBlackman 01/13/2015  ABI 01/16/15  Antibiotics:  IV vancomycin 01/13/2015>>>>01/17/15  IV ciprofloxacin 01/16/2015>>>>01/18/15  IV Zosyn 01/12/2015>>> 01/16/2015  Oral ciprofloxacin 01/18/2015  HPI/Subjective: Patient states nausea has improved. Patient able to tolerate his supper last night. No chest pain. No shortness of breath.  Objective: Filed Vitals:   01/21/15 0446  BP: 132/49  Pulse: 79  Temp: 98.6 F (37 C)  Resp: 16    Intake/Output Summary (Last 24 hours) at 01/21/15 1029 Last data filed at 01/21/15 0800  Gross per 24 hour  Intake      0 ml  Output    500 ml  Net   -500 ml   Filed Weights   01/13/15 0237  Weight: 82.781 kg (182 lb 8 oz)    Exam:   General:  NAD  Cardiovascular: RRR  Respiratory: CTAB  Abdomen: Soft, nontender, nondistended, positive bowel sounds.  Musculoskeletal: No clubbing cyanosis or edema. Left lower foot bandaged. RUE fistula  Data Reviewed: Basic Metabolic Panel:  Recent Labs Lab 01/17/15 0832 01/18/15 0652 01/19/15 0549 01/20/15 0525 01/21/15 0425  NA 140 139 140 139 136  K 4.2 4.5 4.8 4.5 4.4  CL 105 103 104 103 100  CO2 24 27 24 25 25   GLUCOSE 187* 172* 132* 131* 141*  BUN 12 10 13 15 16   CREATININE 1.06 1.10 1.21 1.33 1.30  CALCIUM 9.2 9.3 9.4 9.5 9.6  PHOS  --  2.7  --   --   --    Liver Function Tests:  Recent Labs Lab 01/15/15 0600 01/18/15 0652  AST 11  --   ALT 10  --   ALKPHOS 63  --   BILITOT 0.8  --   PROT 6.8  --   ALBUMIN 3.1* 3.1*   No results for input(s): LIPASE, AMYLASE in the last 168 hours. No results for input(s): AMMONIA in the last 168 hours. CBC:  Recent Labs Lab 01/15/15 0600 01/17/15 0832 01/18/15 0652 01/19/15 0549 01/21/15 0425  WBC 7.5 7.6 7.2 7.8 6.6  HGB 11.2* 12.1* 12.5* 12.3* 12.6*  HCT 35.3* 37.4* 38.6* 38.5* 38.8*  MCV 81.3 81.3 81.3 82.6 81.3  PLT 165 188 201 181 199   Cardiac Enzymes: No results for input(s): CKTOTAL, CKMB, CKMBINDEX, TROPONINI in  the last 168 hours. BNP (last 3 results) No results for input(s): BNP in the last 8760 hours.  ProBNP (last 3 results) No results for input(s): PROBNP in the last 8760 hours.  CBG:  Recent Labs Lab 01/20/15 0607 01/20/15 1113 01/20/15 1619 01/20/15 2115 01/21/15 0637  GLUCAP 119* 132* 163* 204* 144*    Recent Results (from the past 240 hour(s))  Surgical pcr screen     Status: None   Collection Time: 01/13/15  2:53 PM  Result Value Ref Range Status   MRSA, PCR NEGATIVE NEGATIVE Final   Staphylococcus aureus NEGATIVE NEGATIVE Final    Comment:        The Xpert SA Assay (FDA approved for NASAL specimens in patients over 68 years of age), is one component of a comprehensive surveillance program.  Test performance has been validated by [facility] for patients greater than or equal to 37 year old. It is not intended to diagnose infection nor to guide or monitor treatment.   Wound culture     Status: None   Collection Time: 01/13/15  4:00 PM  Result Value Ref Range Status   Specimen Description WOUND  Final   Special Requests LEFT FOOT  Final   Gram Stain   Final    MODERATE WBC PRESENT, PREDOMINANTLY PMN FEW SQUAMOUS EPITHELIAL CELLS PRESENT MODERATE GRAM POSITIVE COCCI IN PAIRS IN CHAINS IN CLUSTERS Performed at [facility]    Culture   Final    ABUNDANT CITROBACTER BRAAKII Performed at [facility]    Report Status 01/16/2015 FINAL  Final   Organism ID, Bacteria CITROBACTER BRAAKII  Final      Susceptibility   Citrobacter braakii - MIC*    CEFAZOLIN >=64 RESISTANT Resistant     CEFEPIME <=1 SENSITIVE Sensitive     CEFTAZIDIME <=1 SENSITIVE Sensitive     CEFTRIAXONE <=1 SENSITIVE Sensitive     CIPROFLOXACIN <=0.25 SENSITIVE Sensitive     GENTAMICIN <=1 SENSITIVE Sensitive     IMIPENEM 0.5 SENSITIVE Sensitive     PIP/TAZO <=4 SENSITIVE Sensitive     TOBRAMYCIN <=1 SENSITIVE Sensitive     TRIMETH/SULFA <=20 SENSITIVE Sensitive     *  ABUNDANT CITROBACTER BRAAKII     Studies: No results found.  Scheduled Meds: . allopurinol  300 mg Oral BID  . amLODipine  10 mg Oral q morning - 10a  . aspirin EC  81 mg Oral Daily  . ciprofloxacin  500 mg Oral BID  . heparin  5,000 Units Subcutaneous 3 times per day  . insulin aspart  0-15 Units Subcutaneous TID WC  . insulin aspart  0-5 Units Subcutaneous QHS  . insulin glargine  16 Units Subcutaneous Daily  . insulin glargine  25 Units Subcutaneous Once  . mycophenolate  1,000 mg Oral BID  . pantoprazole  40 mg Oral Daily  . prazosin  6 mg Oral QHS  . sertraline  50 mg Oral QHS  . simvastatin  20 mg Oral QHS  . tacrolimus  5 mg Oral BID   Continuous Infusions: . sodium chloride      Principal Problem:  Cellulitis and abscess of foot excluding toe Active Problems:   Essential hypertension   Cellulitis and abscess of foot   Diabetes mellitus without complication   Leukocytosis   PVD (peripheral vascular disease)   Ischemic ulcer of toe   Peripheral vascular disease    Time spent: 35 mins    [facility] MD [facility] Pager [phone]. If 7PM-7AM, please contact night-coverage at [email or web address], password [facility] 01/21/2015, 10:29 AM  LOS: 8 days

## 2015-01-21 NOTE — Progress Notes (Signed)
PT Cancellation Note  Patient Details Name: [name] DikeClarence Klasen MRN: [id number] DOB: 1951-03-13   Cancelled Treatment:    Reason Eval/Treat Not Completed: Patient not medically ready (bed rest orders until 17:00 on 01/21/15 per RN).  Pt post-cath this am.  PT will continue to follow pt acutely as appropriate.     Michail JewelsAshley Parr PT, DPT [phone] Pager: [phone]  01/21/2015, 3:38 PM

## 2015-01-21 NOTE — H&P (View-Only) (Signed)
[facility] Consult  Reason for Consult:  Left great toe ulcer Referring Physician:  Dr. Lajoyce Cornersuda MRN #:  [id number]  History of Present Illness: This is a 64 y.o. male who we've been consulted on regarding a left great toe ulcer. The patient reported a 1-2 week history of left great toe pain, swelling and drainage. He had an I & D by Dr. Magnus IvanBlackman on 01/13/15. Dr. Lajoyce Cornersuda was then consulted for first ray amputation. ABIs were obtained and revealed non-compressible vessels. He denies any trauma. He has a past medical history of poorly controlled diabetes, hypertension and is s/p renal transplant 6 years ago.  He denies any prior history of non-healing wounds and rest pain. He denies any fever or chills. He gets intermittent cramps in his left hip and calf bilaterally at rest and with exercise, so this does not fit with typical claudication. He is active and ambulates daily. He is a previous smoker quitting 25 years ago.   He has no history of CAD or CVA. On ROS, he denies any numbness or weakness of his extremities, chest pain or shortness of breath. Full ROS below.   Past Medical History  Diagnosis Date  . Renal disorder   . Diabetes mellitus without complication   . CPAP (continuous positive airway pressure) dependence   . Sleep apnea   . Legally blind in left eye, as defined [place]   . PTSD (post-traumatic stress disorder)   . PVD (peripheral vascular disease) 01/17/2015   Past Surgical History  Procedure Laterality Date  . Combined kidney-pancreas transplant Left 2009  . Incision and drainage abscess Left 01/13/2015    Procedure: INCISION AND DRAINAGE OF LEFT FOOT FIRST RAY ABCESS;  Surgeon: Kathryne Hitchhristopher Y Blackman, MD;  Location: [facility] OR;  Service: Orthopedics;  Laterality: Left;    No Known Allergies  Prior to Admission medications   Medication Sig Start Date End Date Taking? Authorizing Provider  allopurinol (ZYLOPRIM) 300 MG tablet Take 300 mg by mouth 2 (two)  times daily.    Yes Historical Provider, MD  amLODipine (NORVASC) 10 MG tablet Take 10 mg by mouth every morning.   Yes Historical Provider, MD  Chlorpheniramine-DM (CORICIDIN HBP COUGH/COLD PO) Take 1 tablet by mouth 2 (two) times daily as needed (cough).   Yes Historical Provider, MD  docusate sodium (COLACE) 100 MG capsule Take 100 mg by mouth 2 (two) times daily as needed for mild constipation.    Yes Historical Provider, MD  glipiZIDE (GLUCOTROL) 10 MG tablet Take 10 mg by mouth daily before breakfast.   Yes Historical Provider, MD  guaiFENesin-dextromethorphan (ROBITUSSIN DM) 100-10 MG/5ML syrup Take 5 mLs by mouth every 4 (four) hours as needed for cough.   Yes Historical Provider, MD  mirtazapine (REMERON) 15 MG tablet Take 7.5 mg by mouth at bedtime as needed (insomnia).   Yes Historical Provider, MD  mycophenolate (CELLCEPT) 250 MG capsule Take 1,000 mg by mouth 2 (two) times daily.   Yes Historical Provider, MD  prazosin (MINIPRESS) 2 MG capsule Take 6 mg by mouth at bedtime.   Yes Historical Provider, MD  sertraline (ZOLOFT) 100 MG tablet Take 50 mg by mouth at bedtime.   Yes Historical Provider, MD  simvastatin (ZOCOR) 40 MG tablet Take 20 mg by mouth at bedtime.   Yes Historical Provider, MD  tacrolimus (PROGRAF) 1 MG capsule Take 5 mg by mouth 2 (two) times daily.    Yes Historical Provider, MD    History  Social History  . Marital Status: Married    Spouse Name: N/A  . Number of Children: N/A  . Years of Education: N/A   Occupational History  . Not on file.   Social History Main Topics  . Smoking status: Former Games developer  . Smokeless tobacco: Never Used  . Alcohol Use: Yes     Comment: 1 beer rarely   . Drug Use: No  . Sexual Activity: Not on file   Other Topics Concern  . Not on file   Social History Narrative    History reviewed. No pertinent family history.  ROS:  Positive    Negative    All sytems reviewed and are negative  Cardiovascular:   chest pain/pressure  palpitations  SOB lying flat  DOE  pain in legs while walking  pain in legs at rest  pain in legs at night  non-healing ulcers  hx of DVT  swelling in legs  Pulmonary:  productive cough  asthma/wheezing  home O2  Neurologic:  weakness in  arms  legs  numbness in  arms  legs  hx of CVA  mini stroke difficulty speaking or slurred speech  temporary loss of vision in one eye  dizziness  Hematologic:  hx of cancer  bleeding problems  problems with blood clotting easily  Endocrine:   diabetes  thyroid disease  GI  vomiting blood  blood in stool  GU:  CKD/renal failure  HD--[]  M/W/F or  T/T/S  burning with urination  blood in urine  Psychiatric:  anxiety  depression  Musculoskeletal:  arthritis  joint pain  Integumentary:  rashes  ulcers  Constitutional:  fever  chills   Physical Examination  Filed Vitals:   01/16/15 2007  BP: 133/60  Pulse: 77  Temp: 97.9 F (36.6 C)  Resp:    Body mass index is 26.19 kg/(m^2).  General:  WDWN in NAD Gait: Not observed HENT: WNL, normocephalic Pulmonary: normal non-labored breathing, without Rales, rhonchi,  wheezing Cardiac: regular, without  Murmurs, rubs or gallops; without carotid bruits Abdomen: soft, NT/ND, no masses Vascular Exam/Pulses: 2+ femoral b/l, 1+ popliteal b/l, non-palpable pedal pulses right foot. Left foot with post-operative dressing. Did not examine ulcer. Did not feel left pedal pulses through dressing.  Extremities: ulcer of left great toe.  Musculoskeletal: no muscle wasting or atrophy  Neurologic: A&O X 3; Appropriate Affect ; SENSATION: normal; MOTOR FUNCTION:  moving all extremities equally. Speech is fluent/normal   CBC    Component Value Date/Time   WBC 7.6 01/17/2015 0832   RBC 4.60 01/17/2015 0832   HGB 12.1* 01/17/2015 0832   HCT 37.4* 01/17/2015 0832   PLT 188  01/17/2015 0832   MCV 81.3 01/17/2015 0832   MCH 26.3 01/17/2015 0832   MCHC 32.4 01/17/2015 0832   RDW 13.7 01/17/2015 0832   LYMPHSABS 1.4 01/12/2015 2317   MONOABS 0.9 01/12/2015 2317   EOSABS 0.1 01/12/2015 2317   BASOSABS 0.0 01/12/2015 2317    BMET    Component Value Date/Time   NA 140 01/17/2015 0832   K 4.2 01/17/2015 0832   CL 105 01/17/2015 0832   CO2 24 01/17/2015 0832   GLUCOSE 187* 01/17/2015 0832   BUN 12 01/17/2015 0832   CREATININE 1.06 01/17/2015 0832   CALCIUM 9.2 01/17/2015 0832   GFRNONAA 73* 01/17/2015 0832   GFRAA 85* 01/17/2015 0832    COAGS: Lab Results  Component Value Date   INR 1.07 10/09/2009    Statin:  Yes.   Beta Blocker:  No. Aspirin:  Yes.   ACEI:  No. ARB:  No. Other antiplatelets/anticoagulants:  No.   ASSESSMENT: This is a 64 y.o. male with ischemic left great toe ulcer.   PLAN: His ABIs reveal non-compressible vessels with monophasic dorsalis pedis and posterior tibial waveforms bilaterally. He is currently on IV ciprofloxacin for wound cultures revealing citrobacter braakii. He is currently stable and his leukocytosis is now normal. He will need an abdominal aortogram with bilateral runoff and possible intervention for further evaluation. Explained that if he has multi-level occlusive disease, he may require surgical revascularization with a bypass procedure. Plan for arteriogram on Monday with Dr. Edilia Bo on Monday.   Maris Berger, PA-C [facility] Office: [phone] Pager: [phone]  Agree with above. Based on his exam, he has evidence of tibial artery occlusive disease.   I have independently interpreted his arterial Doppler study which shows his vessels are noncompressible. He has monophasic signals in the dorsalis pedis and posterior tibial positions bilaterally. Toe pressures could not be obtained.  He has an extensive wound involving the left great toe and ray amputation has been recommended by  Dr. Lajoyce Corners. I have recommended that we proceed with arteriography in order to further assess the circulation and evaluate his options for revascularization. I have reviewed with the patient the indications for arteriography. In addition, I have reviewed the potential complications of arteriography including but not limited to: Bleeding, arterial injury, arterial thrombosis, dye action, renal insufficiency, or other unpredictable medical problems. I have explained to the patient that if we find disease amenable to angioplasty we could potentially address this at the same time. I have discussed the potential complications of angioplasty and stenting, including but not limited to: Bleeding, arterial thrombosis, arterial injury, dissection, or the need for surgical intervention. His arteriogram is scheduled for Monday. I will make further recommendations pending these results.  Waverly Ferrari, MD, FACS [employer] [phone] Office: [phone]

## 2015-01-21 NOTE — Interval H&P Note (Signed)
History and Physical Interval Note:  01/21/2015 9:01 AM  [name]  has presented today for surgery, with the diagnosis of left foot ulcer  The various methods of treatment have been discussed with the patient and family. After consideration of risks, benefits and other options for treatment, the patient has consented to  Procedure(s): ABDOMINAL AORTAGRAM (N/A) as a surgical intervention .  The patient's history has been reviewed, patient examined, no change in status, stable for surgery.  I have reviewed the patient's chart and labs.  Questions were answered to the patient's satisfaction.     Dorrian Doggett S

## 2015-01-21 NOTE — Op Note (Signed)
   PATIENT: [name]   MRN: [id number] DOB: 07-Oct-1950    DATE OF PROCEDURE: 01/21/2015  INDICATIONS: [name] DikeClarence Hellmer is a 64 y.o. male with a nonhealing wound on his left great toe. His exam suggested significant tibial artery occlusive disease and he presents for arteriography to further evaluate his circulation.  PROCEDURE:  1. Ultrasound-guided access of the right common femoral artery 2. Aortogram with bilateral iliac arteriogram 3. Selective catheterization of the left external iliac artery with left lower extremity runoff  SURGEON: Di Kindlehristopher S. Edilia Boickson, MD, FACS  ANESTHESIA: local with sedation   EBL: minimal  TECHNIQUE: the patient was brought to the peripheral vascular lab and received  1 milligram of Versed and 50 g of fentanyl. Groins were prepped and draped in usual sterile fashion. Under ultrasound guidance, after the skin was anesthetized, the right common femoral artery was cannulated with a micropuncture needle and a micropuncture sheath introduced over the wire. This was exchanged for a 5 French sheath over a versa core wire. A pigtail catheter was positioned at the L1 vertebral body and flush aortogram obtained. The catheter was positioned above the aortic bifurcation and exchanged for a crossover catheter which was positioned into the left common iliac artery. 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 I was able to manipulate the angled Glidewire past the hypogastric artery on the left and passed the transplant kidney. The crossover catheter was was exchanged for a straight catheter which was positioned into the left distal external iliac artery. Selective arterial grams obtained with left lower extremity runoff.At the completion of the catheter was removed and the patient was transferred to the holding area for removal of the sheath. No immediate complications were noted.  FINDINGS:  1. There are single renal arteries bilaterally. The infrarenal aorta, bilateral  common iliac arteries, bilateral hypogastric arteries, bilateral external iliac arteries are patent. The anastomosis to the kidney and the left pelvis is patent. 2. On the left side, which is a site of concern, the common femoral, deep femoral, superficial femoral, popliteal, and posterior tibial arteries are patent. The left posterior tibial artery continues onto the foot area the peroneal artery is occluded proximally. The anterior tibial arteries occluded in the mid calf. The distal anterior tibial and dorsalis pedis arteries are occluded.  CLINICAL NOTE: The patient has single-vessel runoff on the left through the posterior tibial artery. The peroneal and distal anterior tibial arteries are occluded and there are no options for revascularization on the left. However I think he is a reasonable chance of healing toe amp dictation on the left this is required.  Waverly Ferrarihristopher Cadince Hilscher, MD, FACS [facility]  DATE OF DICTATION:   01/21/2015

## 2015-01-22 LAB — CBC
HCT: 36.4 % — ABNORMAL LOW (ref 39.0–52.0)
Hemoglobin: 11.6 g/dL — ABNORMAL LOW (ref 13.0–17.0)
MCH: 25.9 pg — ABNORMAL LOW (ref 26.0–34.0)
MCHC: 31.9 g/dL (ref 30.0–36.0)
MCV: 81.3 fL (ref 78.0–100.0)
Platelets: 198 10*3/uL (ref 150–400)
RBC: 4.48 MIL/uL (ref 4.22–5.81)
RDW: 14.1 % (ref 11.5–15.5)
WBC: 6.6 10*3/uL (ref 4.0–10.5)

## 2015-01-22 LAB — BASIC METABOLIC PANEL
Anion gap: 11 (ref 5–15)
BUN: 17 mg/dL (ref 6–23)
CO2: 22 mmol/L (ref 19–32)
Calcium: 9.7 mg/dL (ref 8.4–10.5)
Chloride: 105 mmol/L (ref 96–112)
Creatinine, Ser: 1.25 mg/dL (ref 0.50–1.35)
GFR calc Af Amer: 70 mL/min — ABNORMAL LOW (ref >90)
GFR calc non Af Amer: 60 mL/min — ABNORMAL LOW (ref >90)
Glucose, Bld: 158 mg/dL — ABNORMAL HIGH (ref 70–99)
Potassium: 4.6 mmol/L (ref 3.5–5.1)
Sodium: 138 mmol/L (ref 135–145)

## 2015-01-22 LAB — GLUCOSE, CAPILLARY
Glucose-Capillary: 134 mg/dL — ABNORMAL HIGH (ref 70–99)
Glucose-Capillary: 207 mg/dL — ABNORMAL HIGH (ref 70–99)
Glucose-Capillary: 113 mg/dL — ABNORMAL HIGH (ref 70–99)
Glucose-Capillary: 145 mg/dL — ABNORMAL HIGH (ref 70–99)

## 2015-01-22 NOTE — Progress Notes (Signed)
Utilization review completed.  

## 2015-01-22 NOTE — Progress Notes (Signed)
[facility] PROGRESS NOTE  [name] DikeClarence [name] ZOX:[id number] DOB: 02/16/1951 DOA: 01/12/2015 PCP: No primary care provider on file.  Assessment/Plan: #1 ischemic ulcer left great toe MTP joint/diabetic foot ulcer Patient is currently afebrile. WBC trending down. Wound cultures growing Citrobacter Braakii. Patient is status post irrigation and excisional debridement of left foot abscess. Continue oral ciprofloxacin. ABIs have been done, and have calcified vessels with monophasic wave forms showing significant peripheral vascular disease. Patient has been seen in consultation by vascular surgery and patient underwent arteriogram on Monday, 01/21/2015.Patient noted to have a single vessel runoff on the left through the posterior tibial artery. The peroneal and distal anterior tibial arteries were occluded with no options for revascularization on the left. Is felt by vascular surgery that patient has a reasonable chance of healing post oh amputation. for further evaluation. Vascular and orthopedic surgery following. Continue empiric antibiotics.  #2 poorly controlled type 2 diabetes mellitus Hemoglobin A1c is 11.8. CBGs have ranged from 113- 207. Continue Lantus 16 units daily. Continue SSI.   #3 significant peripheral vascular disease Per ABIs. Patient has been seen in consultation by vascular surgery. Patient has an ischemic left great toe. Patient has been seen by vascular surgery and patient subsequently underwent arteriogram on Monday, 01/21/2015. Patient noted to have a single vessel runoff on the left through the posterior tibial artery. The peroneal and distal anterior tibial arteries were occluded with no options for revascularization on the left. Is felt by vascular surgery that patient has a reasonable chance of healing post oh amputation. Continue risk factor modification. Continue enteric-coated aspirin. Fasting lipid panel with LDL of 65. Vascular and orthopedics ff.  #4  hypertension Stable. Continue Norvasc, prazosin.  #5 leukocytosis Likely secondary to problem #1. WBC trending down. Continue oral ciprofloxacin.  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 Continue current regimen of tacrolimus and CellCept. Anti emetics prn.  #7 prophylaxis Heparin for DVT prophylaxis.  Code Status: Full Family Communication: Updated patient. No family at bedside. Disposition Plan: Remain inpatient.   Consultants:  Orthopedics: Dr. Lajoyce Cornersuda 01/14/2015  Vascular surgery: Dr. Waverly Ferrarihristopher Dickson 01/17/2015  Procedures:  X-ray left foot 01/12/2015  Irrigation and excisional Debridement left foot abscess per Dr. Magnus IvanBlackman 01/13/2015  ABI 01/16/15  Ultrasound-guided access of right common femoral artery. Aortogram with bilateral iliac arteriogram. Selective catheterization of the left external iliac artery with left lower extremity runoff per Dr. Edilia Boickson 01/21/2015  Antibiotics:  IV vancomycin 01/13/2015>>>>01/17/15  IV ciprofloxacin 01/16/2015>>>>01/18/15  IV Zosyn 01/12/2015>>> 01/16/2015  Oral ciprofloxacin 01/18/2015  HPI/Subjective: Patient states nausea has improved. Patient denies any shortness of breath. Patient denies any chest pain.  Objective: Filed Vitals:   01/22/15 0500  BP: 136/57  Pulse: 81  Temp: 98.8 F (37.1 C)  Resp: 18    Intake/Output Summary (Last 24 hours) at 01/22/15 1006 Last data filed at 01/21/15 1700  Gross per 24 hour  Intake    240 ml  Output      0 ml  Net    240 ml   Filed Weights   01/13/15 0237  Weight: 82.781 kg (182 lb 8 oz)    Exam:   General:  NAD  Cardiovascular: RRR  Respiratory: CTAB  Abdomen: Soft, nontender, nondistended, positive bowel sounds.  Musculoskeletal: No clubbing cyanosis or edema. Left lower foot bandaged. RUE fistula  Data Reviewed: Basic Metabolic Panel:  Recent Labs Lab 01/18/15 0652 01/19/15 0549 01/20/15 0525 01/21/15 0425 01/22/15 0532  NA 139 140  139 136 138  K  4.5 4.8 4.5 4.4 4.6  CL 103 104 103 100 105  CO2 GLUCOSE 172* 132* 131* 141* 158*  BUN CREATININE 1.10 1.21 1.33 1.30 1.25  CALCIUM 9.3 9.4 9.5 9.6 9.7  PHOS 2.7  --   --   --   --    Liver Function Tests:  Recent Labs Lab 01/18/15 0652  ALBUMIN 3.1*   No results for input(s): LIPASE, AMYLASE in the last 168 hours. No results for input(s): AMMONIA in the last 168 hours. CBC:  Recent Labs Lab 01/17/15 0832 01/18/15 0652 01/19/15 0549 01/21/15 0425 01/22/15 0532  WBC 7.6 7.2 7.8 6.6 6.6  HGB 12.1* 12.5* 12.3* 12.6* 11.6*  HCT 37.4* 38.6* 38.5* 38.8* 36.4*  MCV 81.3 81.3 82.6 81.3 81.3  PLT 188 201 181 199 198   Cardiac Enzymes: No results for input(s): CKTOTAL, CKMB, CKMBINDEX, TROPONINI in the last 168 hours. BNP (last 3 results) No results for input(s): BNP in the last 8760 hours.  ProBNP (last 3 results) No results for input(s): PROBNP in the last 8760 hours.  CBG:  Recent Labs Lab 01/21/15 0637 01/21/15 1230 01/21/15 1706 01/21/15 2109 01/22/15 0617  GLUCAP 144* 134* 151* 134* 134*    Recent Results (from the past 240 hour(s))  Surgical pcr screen     Status: None   Collection Time: 01/13/15  2:53 PM  Result Value Ref Range Status   MRSA, PCR NEGATIVE NEGATIVE Final   Staphylococcus aureus NEGATIVE NEGATIVE Final    Comment:        The Xpert SA Assay (FDA approved for NASAL specimens in patients over 60 years of age), is one component of a comprehensive surveillance program.  Test performance has been validated by [facility] for patients greater than or equal to 62 year old. It is not intended to diagnose infection nor to guide or monitor treatment.   Wound culture     Status: None   Collection Time: 01/13/15  4:00 PM  Result Value Ref Range Status   Specimen Description WOUND  Final   Special Requests LEFT FOOT  Final   Gram Stain   Final    MODERATE WBC PRESENT, PREDOMINANTLY PMN FEW  SQUAMOUS EPITHELIAL CELLS PRESENT MODERATE GRAM POSITIVE COCCI IN PAIRS IN CHAINS IN CLUSTERS Performed at [facility]    Culture   Final    ABUNDANT CITROBACTER BRAAKII Performed at [facility]    Report Status 01/16/2015 FINAL  Final   Organism ID, Bacteria CITROBACTER BRAAKII  Final      Susceptibility   Citrobacter braakii - MIC*    CEFAZOLIN >=64 RESISTANT Resistant     CEFEPIME <=1 SENSITIVE Sensitive     CEFTAZIDIME <=1 SENSITIVE Sensitive     CEFTRIAXONE <=1 SENSITIVE Sensitive     CIPROFLOXACIN <=0.25 SENSITIVE Sensitive     GENTAMICIN <=1 SENSITIVE Sensitive     IMIPENEM 0.5 SENSITIVE Sensitive     PIP/TAZO <=4 SENSITIVE Sensitive     TOBRAMYCIN <=1 SENSITIVE Sensitive     TRIMETH/SULFA <=20 SENSITIVE Sensitive     * ABUNDANT CITROBACTER BRAAKII     Studies: No results found.  Scheduled Meds: . allopurinol  300 mg Oral BID  . amLODipine  10 mg Oral q morning - 10a  . aspirin EC  81 mg Oral Daily  . ciprofloxacin  500 mg Oral BID  . enoxaparin (LOVENOX) injection  40 mg Subcutaneous Q24H  .  insulin aspart  0-15 Units Subcutaneous TID WC  . insulin aspart  0-5 Units Subcutaneous QHS  . insulin glargine  16 Units Subcutaneous Daily  . insulin glargine  25 Units Subcutaneous Once  . mycophenolate  1,000 mg Oral BID  . pantoprazole  40 mg Oral Daily  . prazosin  6 mg Oral QHS  . sertraline  50 mg Oral QHS  . simvastatin  20 mg Oral QHS  . tacrolimus  5 mg Oral BID   Continuous Infusions:    Principal Problem:   Cellulitis and abscess of foot excluding toe Active Problems:   Essential hypertension   Cellulitis and abscess of foot   Diabetes mellitus without complication   Leukocytosis   PVD (peripheral vascular disease)   Ischemic ulcer of toe   Peripheral vascular disease    Time spent: 35 mins    [facility] MD [facility] Pager [phone]. If 7PM-7AM, please contact night-coverage at [email or web address], password  [facility] 01/22/2015, 10:06 AM  LOS: 9 days

## 2015-01-22 NOTE — Progress Notes (Signed)
Physical Therapy Treatment Patient Details Name: [name] MRN: [id number] DOB: Oct 26, 1950 Today's Date: 01/22/2015    History of Present Illness Pt is a 64 y/o M s/p irrigation and excisional debridement of L foot abscess on plantar aspect of foot under 1st ray.  Pt's PMH includes PTSD, legally blind in L eye, DM, renal disorder.    PT Comments    Pt progressing towards physical therapy goals. Tolerance for functional activity continues to be low due to pain with movement, however with standing rest breaks and a lower height of the RW, pt was able to manage mobility well around the room. Anticipate that pt will continue to make functional gains. Per pt there is still a possibility of amputation of that toe. Will continue to follow and progress as able per POC. Will need to practice steps prior to d/c.   Follow Up Recommendations  Supervision for mobility/OOB;No PT follow up     Equipment Recommendations  Rolling walker with 5" wheels;3in1 (PT);Other (comment) ([facility] states [facility] for him)    Recommendations for Other Services       Precautions / Restrictions Precautions Precautions: Fall Restrictions Weight Bearing Restrictions: Yes LLE Weight Bearing: Non weight bearing    Mobility  Bed Mobility               General bed mobility comments: Pt sitting EOB using urinal when PT entered.   Transfers Overall transfer level: Needs assistance Equipment used: Rolling walker (2 wheeled) Transfers: Sit to/from Stand Sit to Stand: Supervision         General transfer comment: Pt was able to power-up to full standing position with no physical assistance. Supervision for safety as pt somewhat unsteady at times.   Ambulation/Gait Ambulation/Gait assistance: Supervision Ambulation Distance (Feet): 35 Feet Assistive device: Rolling walker (2 wheeled) Gait Pattern/deviations: Decreased stride length;Step-to pattern;Trunk flexed Gait velocity:  Decreased Gait velocity interpretation: Below normal speed for age/gender General Gait Details: RW was lowered so pt could lock out elbows for increased UE muscle endurance. Pt was cued for standing rest breaks. Overall pt has good control over walker and maintains NWB status well.    Stairs            Wheelchair Mobility    Modified Rankin (Stroke Patients Only)       Balance Overall balance assessment: Needs assistance Sitting-balance support: Feet supported;No upper extremity supported Sitting balance-Leahy Scale: Good     Standing balance support: Bilateral upper extremity supported Standing balance-Leahy Scale: Poor Standing balance comment: Pt requires UE support to maintain NWB status on the LLE                    Cognition Arousal/Alertness: Awake/alert Behavior During Therapy: WFL for tasks assessed/performed Overall Cognitive Status: Within Functional Limits for tasks assessed                      Exercises Total Joint Exercises Ankle Circles/Pumps: 20 reps Quad Sets: 10 reps Long Arc Quad: 10 reps    General Comments General comments (skin integrity, edema, etc.): Pt is legally blind in L eye and wears glasses for reading and all other activities.      Pertinent Vitals/Pain Pain Assessment: 0-10 Pain Score: 5  Pain Location: Groin and foot Pain Descriptors / Indicators: Aching Pain Intervention(s): Limited activity within patient's tolerance;Monitored during session;Repositioned (Offered to notify RN, pt declined any pain meds)    Home Living  Prior Function            PT Goals (current goals can now be found in the care plan section) Acute Rehab PT Goals Patient Stated Goal: none stated PT Goal Formulation: With patient Time For Goal Achievement: 01/21/15 Potential to Achieve Goals: Good Progress towards PT goals: Progressing toward goals    Frequency  Min 5X/week    PT Plan Current plan  remains appropriate    Co-evaluation             End of Session Equipment Utilized During Treatment: Gait belt Activity Tolerance: Patient tolerated treatment well;Patient limited by fatigue Patient left: in chair;with call bell/phone within reach;with family/visitor present     Time: [id number] PT Time Calculation (min) (ACUTE ONLY): 19 min  Charges:  $Therapeutic Activity: 8-22 mins                    G Codes:      Conni SlipperKirkman, Aryanah Enslow 01/22/2015, 8:44 AM   Conni SlipperLaura Jessye Imhoff, PT, DPT Acute Rehabilitation Services Pager: [phone]

## 2015-01-22 NOTE — Progress Notes (Signed)
  Progress Note    01/22/2015 7:22 AM 1 Day Post-Op  Subjective:  No complaints  Tm 98.8 HR 70's-80's NSR 130's systolic 94% RA  Abx (cipro)   Filed Vitals:   01/22/15 0500  BP: 136/57  Pulse: 81  Temp: 98.8 F (37.1 C)  Resp: 18    Physical Exam: Incisions:  Right groin is soft without hematoma Extremities:  Left foot is wrapped with ace bandage in place   CBC    Component Value Date/Time   WBC 6.6 01/22/2015 0532   RBC 4.48 01/22/2015 0532   HGB 11.6* 01/22/2015 0532   HCT 36.4* 01/22/2015 0532   PLT 198 01/22/2015 0532   MCV 81.3 01/22/2015 0532   MCH 25.9* 01/22/2015 0532   MCHC 31.9 01/22/2015 0532   RDW 14.1 01/22/2015 0532   LYMPHSABS 1.4 01/12/2015 2317   MONOABS 0.9 01/12/2015 2317   EOSABS 0.1 01/12/2015 2317   BASOSABS 0.0 01/12/2015 2317    BMET    Component Value Date/Time   NA 138 01/22/2015 0532   K 4.6 01/22/2015 0532   CL 105 01/22/2015 0532   CO2 22 01/22/2015 0532   GLUCOSE 158* 01/22/2015 0532   BUN 17 01/22/2015 0532   CREATININE 1.25 01/22/2015 0532   CALCIUM 9.7 01/22/2015 0532   GFRNONAA 60* 01/22/2015 0532   GFRAA 70* 01/22/2015 0532    INR    Component Value Date/Time   INR 1.07 10/09/2009 0001     Intake/Output Summary (Last 24 hours) at 01/22/15 0722 Last data filed at 01/21/15 1700  Gross per 24 hour  Intake    240 ml  Output      0 ml  Net    240 ml     Assessment:  64 y.o. male is s/p:  1. Ultrasound-guided access of the right common femoral artery 2. Aortogram with bilateral iliac arteriogram 3. Selective catheterization of the left external iliac artery with left lower extremity runoff  1 Day Post-Op  Plan: -right groin is soft without hematoma -pt with single-vessel runoff on the left through the posterior tibial artery. The peroneal and distal anterior tibial arteries are occluded and there are no options for revascularization on the left. However I think he is a reasonable chance of healing toe  amp dictation on the left this is required -DVT prophylaxis:  Lovenox -plan per Dr. Edilia Boickson & Dr. Erasmo Scoreuda   Samantha Rhyne, PA-C [facility] [phone] 01/22/2015 7:22 AM

## 2015-01-22 NOTE — Progress Notes (Signed)
Medicare Important Message given? YES  (If response is "NO", the following Medicare IM given date fields will be blank)  Date Medicare IM given: 01/22/15 Medicare IM given by:  Audrionna Lampton  

## 2015-01-23 ENCOUNTER — Encounter (HOSPITAL_COMMUNITY)
Admission: EM | Disposition: A | Discharge status: Home Health | Payer: Self-pay | Source: Home / Self Care | Attending: Internal Medicine

## 2015-01-23 ENCOUNTER — Inpatient Hospital Stay (HOSPITAL_COMMUNITY): Payer: Medicare Other | Admitting: Anesthesiology

## 2015-01-23 HISTORY — PX: AMPUTATION: SHX166

## 2015-01-23 LAB — PROTIME-INR
INR: 1.11 (ref 0.00–1.49)
Prothrombin Time: 14.4 seconds (ref 11.6–15.2)

## 2015-01-23 LAB — GLUCOSE, CAPILLARY
Glucose-Capillary: 141 mg/dL — ABNORMAL HIGH (ref 70–99)
Glucose-Capillary: 209 mg/dL — ABNORMAL HIGH (ref 70–99)
Glucose-Capillary: 221 mg/dL — ABNORMAL HIGH (ref 70–99)
Glucose-Capillary: 194 mg/dL — ABNORMAL HIGH (ref 70–99)

## 2015-01-23 LAB — BASIC METABOLIC PANEL
Anion gap: 7 (ref 5–15)
BUN: 15 mg/dL (ref 6–23)
CO2: 25 mmol/L (ref 19–32)
Calcium: 9.7 mg/dL (ref 8.4–10.5)
Chloride: 103 mmol/L (ref 96–112)
Creatinine, Ser: 1.18 mg/dL (ref 0.50–1.35)
GFR calc Af Amer: 75 mL/min — ABNORMAL LOW (ref >90)
GFR calc non Af Amer: 64 mL/min — ABNORMAL LOW (ref >90)
Glucose, Bld: 134 mg/dL — ABNORMAL HIGH (ref 70–99)
Potassium: 4.8 mmol/L (ref 3.5–5.1)
Sodium: 135 mmol/L (ref 135–145)

## 2015-01-23 LAB — CBC
HCT: 36.5 % — ABNORMAL LOW (ref 39.0–52.0)
Hemoglobin: 11.9 g/dL — ABNORMAL LOW (ref 13.0–17.0)
MCH: 26.4 pg (ref 26.0–34.0)
MCHC: 32.6 g/dL (ref 30.0–36.0)
MCV: 81.1 fL (ref 78.0–100.0)
Platelets: 202 10*3/uL (ref 150–400)
RBC: 4.5 MIL/uL (ref 4.22–5.81)
RDW: 14 % (ref 11.5–15.5)
WBC: 6.4 10*3/uL (ref 4.0–10.5)

## 2015-01-23 SURGERY — AMPUTATION, FOOT, RAY
Anesthesia: General | Site: Foot | Laterality: Left

## 2015-01-23 MED ORDER — FENTANYL CITRATE (PF) 250 MCG/5ML IJ SOLN
INTRAMUSCULAR | Status: AC
  Filled 2015-01-23: qty 5

## 2015-01-23 MED ORDER — LIDOCAINE HCL (CARDIAC) 20 MG/ML IV SOLN
INTRAVENOUS | Status: DC | PRN
  Administered 2015-01-23: 50 mg via INTRAVENOUS

## 2015-01-23 MED ORDER — METHOCARBAMOL 500 MG PO TABS
500.0000 mg | ORAL_TABLET | Freq: Four times a day (QID) | ORAL | Status: DC | PRN
  Filled 2015-01-23: qty 1

## 2015-01-23 MED ORDER — ACETAMINOPHEN 650 MG RE SUPP: 650.0000 mg | Freq: Four times a day (QID) | RECTAL | Status: DC | PRN

## 2015-01-23 MED ORDER — METOCLOPRAMIDE HCL 5 MG PO TABS
5.0000 mg | ORAL_TABLET | Freq: Three times a day (TID) | ORAL | Status: DC | PRN
  Filled 2015-01-23: qty 2

## 2015-01-23 MED ORDER — MIDAZOLAM HCL 5 MG/5ML IJ SOLN
INTRAMUSCULAR | Status: DC | PRN
  Administered 2015-01-23: 2 mg via INTRAVENOUS

## 2015-01-23 MED ORDER — DEXAMETHASONE SODIUM PHOSPHATE 4 MG/ML IJ SOLN
INTRAMUSCULAR | Status: AC
  Filled 2015-01-23: qty 1

## 2015-01-23 MED ORDER — PROMETHAZINE HCL 25 MG/ML IJ SOLN: 6.2500 mg | INTRAMUSCULAR | Status: DC | PRN

## 2015-01-23 MED ORDER — ACETAMINOPHEN 325 MG PO TABS
ORAL_TABLET | ORAL | Status: AC
  Filled 2015-01-23: qty 2

## 2015-01-23 MED ORDER — ACETAMINOPHEN 325 MG PO TABS: 650.0000 mg | ORAL_TABLET | Freq: Four times a day (QID) | ORAL | Status: DC | PRN

## 2015-01-23 MED ORDER — SODIUM CHLORIDE 0.9 % IV SOLN
INTRAVENOUS | Status: DC | PRN
  Administered 2015-01-23: 12:00:00 via INTRAVENOUS

## 2015-01-23 MED ORDER — SODIUM CHLORIDE 0.9 % IV SOLN: INTRAVENOUS | Status: DC

## 2015-01-23 MED ORDER — METHOCARBAMOL 1000 MG/10ML IJ SOLN: 500.0000 mg | Freq: Four times a day (QID) | INTRAVENOUS | Status: DC | PRN

## 2015-01-23 MED ORDER — PHENYLEPHRINE HCL 10 MG/ML IJ SOLN
INTRAMUSCULAR | Status: AC
  Filled 2015-01-23: qty 1

## 2015-01-23 MED ORDER — SUCCINYLCHOLINE CHLORIDE 20 MG/ML IJ SOLN
INTRAMUSCULAR | Status: AC
  Filled 2015-01-23: qty 1

## 2015-01-23 MED ORDER — KETOROLAC TROMETHAMINE 30 MG/ML IJ SOLN
INTRAMUSCULAR | Status: AC
  Filled 2015-01-23: qty 1

## 2015-01-23 MED ORDER — SODIUM CHLORIDE 0.45 % IV SOLN
INTRAVENOUS | Status: DC
  Administered 2015-01-23: 08:00:00 via INTRAVENOUS

## 2015-01-23 MED ORDER — FENTANYL CITRATE (PF) 100 MCG/2ML IJ SOLN
25.0000 ug | INTRAMUSCULAR | Status: DC | PRN
  Administered 2015-01-23: 50 ug via INTRAVENOUS
  Administered 2015-01-23 (×2): 25 ug via INTRAVENOUS

## 2015-01-23 MED ORDER — METHOCARBAMOL 1000 MG/10ML IJ SOLN
500.0000 mg | INTRAVENOUS | Status: DC
  Filled 2015-01-23: qty 5

## 2015-01-23 MED ORDER — LACTATED RINGERS IV SOLN
INTRAVENOUS | Status: DC
  Administered 2015-01-23: 11:00:00 via INTRAVENOUS

## 2015-01-23 MED ORDER — INSULIN GLARGINE 100 UNIT/ML ~~LOC~~ SOLN
20.0000 [IU] | Freq: Every day | SUBCUTANEOUS | Status: DC
  Administered 2015-01-24 – 2015-01-25 (×2): 20 [IU] via SUBCUTANEOUS
  Filled 2015-01-23 (×2): qty 0.2

## 2015-01-23 MED ORDER — HYDROMORPHONE HCL 1 MG/ML IJ SOLN
1.0000 mg | INTRAMUSCULAR | Status: DC | PRN
  Administered 2015-01-23 – 2015-01-24 (×6): 1 mg via INTRAVENOUS
  Filled 2015-01-23 (×6): qty 1

## 2015-01-23 MED ORDER — SUCCINYLCHOLINE CHLORIDE 20 MG/ML IJ SOLN
INTRAMUSCULAR | Status: DC | PRN
  Administered 2015-01-23: 100 mg via INTRAVENOUS

## 2015-01-23 MED ORDER — FENTANYL CITRATE (PF) 100 MCG/2ML IJ SOLN
INTRAMUSCULAR | Status: AC
  Administered 2015-01-23: 25 ug via INTRAVENOUS
  Filled 2015-01-23: qty 2

## 2015-01-23 MED ORDER — CHLORHEXIDINE GLUCONATE 4 % EX LIQD
60.0000 mL | Freq: Once | CUTANEOUS | Status: AC
  Administered 2015-01-23: 4 via TOPICAL
  Filled 2015-01-23: qty 60

## 2015-01-23 MED ORDER — ONDANSETRON HCL 4 MG/2ML IJ SOLN
INTRAMUSCULAR | Status: AC
  Filled 2015-01-23: qty 4

## 2015-01-23 MED ORDER — PROPOFOL 10 MG/ML IV BOLUS
INTRAVENOUS | Status: AC
  Filled 2015-01-23: qty 20

## 2015-01-23 MED ORDER — FENTANYL CITRATE (PF) 100 MCG/2ML IJ SOLN
INTRAMUSCULAR | Status: DC | PRN
  Administered 2015-01-23: 50 ug via INTRAVENOUS

## 2015-01-23 MED ORDER — ONDANSETRON HCL 4 MG PO TABS: 4.0000 mg | ORAL_TABLET | Freq: Four times a day (QID) | ORAL | Status: DC | PRN

## 2015-01-23 MED ORDER — OXYCODONE HCL 5 MG PO TABS
ORAL_TABLET | ORAL | Status: AC
Start: 2015-01-23 — End: 2015-01-23
  Filled 2015-01-23: qty 2

## 2015-01-23 MED ORDER — ONDANSETRON HCL 4 MG/2ML IJ SOLN
INTRAMUSCULAR | Status: DC | PRN
  Administered 2015-01-23: 4 mg via INTRAVENOUS

## 2015-01-23 MED ORDER — EPHEDRINE SULFATE 50 MG/ML IJ SOLN
INTRAMUSCULAR | Status: AC
  Filled 2015-01-23: qty 1

## 2015-01-23 MED ORDER — LIDOCAINE HCL (CARDIAC) 20 MG/ML IV SOLN
INTRAVENOUS | Status: AC
  Filled 2015-01-23: qty 5

## 2015-01-23 MED ORDER — METHOCARBAMOL 500 MG PO TABS
ORAL_TABLET | ORAL | Status: AC
  Filled 2015-01-23: qty 1

## 2015-01-23 MED ORDER — MIDAZOLAM HCL 2 MG/2ML IJ SOLN
INTRAMUSCULAR | Status: AC
  Filled 2015-01-23: qty 2

## 2015-01-23 MED ORDER — PROPOFOL 10 MG/ML IV BOLUS
INTRAVENOUS | Status: DC | PRN
  Administered 2015-01-23: 100 mg via INTRAVENOUS

## 2015-01-23 MED ORDER — PHENYLEPHRINE HCL 10 MG/ML IJ SOLN
INTRAMUSCULAR | Status: DC | PRN
  Administered 2015-01-23: 80 ug via INTRAVENOUS

## 2015-01-23 MED ORDER — CEFAZOLIN SODIUM-DEXTROSE 2-3 GM-% IV SOLR
2.0000 g | INTRAVENOUS | Status: DC
  Filled 2015-01-23: qty 50

## 2015-01-23 MED ORDER — ONDANSETRON HCL 4 MG/2ML IJ SOLN: 4.0000 mg | Freq: Four times a day (QID) | INTRAMUSCULAR | Status: DC | PRN

## 2015-01-23 MED ORDER — SODIUM CHLORIDE 0.9 % IJ SOLN
INTRAMUSCULAR | Status: AC
  Filled 2015-01-23: qty 10

## 2015-01-23 MED ORDER — KETOROLAC TROMETHAMINE 30 MG/ML IJ SOLN: 30.0000 mg | Freq: Once | INTRAMUSCULAR | Status: DC | PRN

## 2015-01-23 MED ORDER — METOCLOPRAMIDE HCL 5 MG/ML IJ SOLN
5.0000 mg | Freq: Three times a day (TID) | INTRAMUSCULAR | Status: DC | PRN
  Filled 2015-01-23: qty 2

## 2015-01-23 SURGICAL SUPPLY — 31 items
BLADE SAW SGTL MED 73X18.5 STR (BLADE) IMPLANT
BNDG COHESIVE 4X5 TAN STRL (GAUZE/BANDAGES/DRESSINGS) ×2 IMPLANT
BNDG COHESIVE 6X5 TAN STRL LF (GAUZE/BANDAGES/DRESSINGS) ×1 IMPLANT
BNDG GAUZE ELAST 4 BULKY (GAUZE/BANDAGES/DRESSINGS) ×2 IMPLANT
COVER SURGICAL LIGHT HANDLE (MISCELLANEOUS) ×4 IMPLANT
DRAPE U-SHAPE 47X51 STRL (DRAPES) ×4 IMPLANT
DRSG ADAPTIC 3X8 NADH LF (GAUZE/BANDAGES/DRESSINGS) ×2 IMPLANT
DRSG PAD ABDOMINAL 8X10 ST (GAUZE/BANDAGES/DRESSINGS) ×3 IMPLANT
DURAPREP 26ML APPLICATOR (WOUND CARE) ×2 IMPLANT
ELECT REM PT RETURN 9FT ADLT (ELECTROSURGICAL) ×2
ELECTRODE REM PT RTRN 9FT ADLT (ELECTROSURGICAL) ×1 IMPLANT
GAUZE SPONGE 4X4 12PLY STRL (GAUZE/BANDAGES/DRESSINGS) ×2 IMPLANT
GLOVE BIOGEL PI IND STRL 9 (GLOVE) ×1 IMPLANT
GLOVE BIOGEL PI INDICATOR 9 (GLOVE) ×1
GLOVE SURG ORTHO 9.0 STRL STRW (GLOVE) ×2 IMPLANT
GOWN STRL REUS W/ TWL LRG LVL3 (GOWN DISPOSABLE) ×1 IMPLANT
GOWN STRL REUS W/ TWL XL LVL3 (GOWN DISPOSABLE) ×2 IMPLANT
GOWN STRL REUS W/TWL LRG LVL3 (GOWN DISPOSABLE) ×2
GOWN STRL REUS W/TWL XL LVL3 (GOWN DISPOSABLE) ×4
KIT BASIN OR (CUSTOM PROCEDURE TRAY) ×2 IMPLANT
KIT ROOM TURNOVER OR (KITS) ×2 IMPLANT
NS IRRIG 1000ML POUR BTL (IV SOLUTION) ×2 IMPLANT
PACK ORTHO EXTREMITY (CUSTOM PROCEDURE TRAY) ×2 IMPLANT
PAD ARMBOARD 7.5X6 YLW CONV (MISCELLANEOUS) ×4 IMPLANT
SPONGE LAP 18X18 X RAY DECT (DISPOSABLE) ×2 IMPLANT
STOCKINETTE IMPERVIOUS LG (DRAPES) IMPLANT
SUT ETHILON 2 0 PSLX (SUTURE) ×4 IMPLANT
TOWEL OR 17X24 6PK STRL BLUE (TOWEL DISPOSABLE) ×2 IMPLANT
TOWEL OR 17X26 10 PK STRL BLUE (TOWEL DISPOSABLE) ×2 IMPLANT
UNDERPAD 30X30 INCONTINENT (UNDERPADS AND DIAPERS) ×2 IMPLANT
WATER STERILE IRR 1000ML POUR (IV SOLUTION) ×2 IMPLANT

## 2015-01-23 NOTE — Progress Notes (Signed)
Patient ID: [name] DikeClarence [name], male   DOB: 29-Dec-1950, 64 y.o.   MRN: [id number] Arteriogram study was reviewed. Patient does not have revascularization options. He has ischemic gangrenous ulcer of the left great toe there is no other ischemic changes to his foot. We will plan for a left foot first ray amputation this evening.

## 2015-01-23 NOTE — Anesthesia Procedure Notes (Signed)
Procedure Name: Intubation Date/Time: 01/23/2015 11:52 AM Performed by: Carmela RimaMARTINELLI, Creg Gilmer F Pre-anesthesia Checklist: Timeout performed, Patient being monitored, Suction available, Emergency Drugs available and Patient identified Patient Re-evaluated:Patient Re-evaluated prior to inductionOxygen Delivery Method: Circle system utilized Preoxygenation: Pre-oxygenation with 100% oxygen Intubation Type: IV induction Ventilation: Mask ventilation without difficulty Laryngoscope Size: Mac and 3 Grade View: Grade II Tube type: Oral Tube size: 7.5 mm Number of attempts: 1 Placement Confirmation: breath sounds checked- equal and bilateral,  positive ETCO2 and ETT inserted through vocal cords under direct vision Secured at: 23 cm Tube secured with: Tape Dental Injury: Teeth and Oropharynx as per pre-operative assessment

## 2015-01-23 NOTE — Progress Notes (Signed)
Reported to Dr. Eilene GhaziGeorge Rose that patient had eaten 5 bites of oatmeal this am at 0800.  It's okay per Dr. Okey Dupreose.

## 2015-01-23 NOTE — Op Note (Addendum)
01/12/2015 - 01/23/2015  12:18 PM  PATIENT:  [name]    PRE-OPERATIVE DIAGNOSIS:  Gangrene left foot first ray  POST-OPERATIVE DIAGNOSIS:  Same  PROCEDURE:  FIRST RAY AMPUTATION/LEFT FOOT, second metatarsal amputation  SURGEON:  Nadara MustardUDA,Lesia Monica V, MD  PHYSICIAN ASSISTANT:None ANESTHESIA:   General  PREOPERATIVE INDICATIONS:  [name] is a  64 y.o. male with a diagnosis of Left Foot Infected who failed conservative measures and elected for surgical management.    The risks benefits and alternatives were discussed with the patient preoperatively including but not limited to the risks of infection, bleeding, nerve injury, cardiopulmonary complications, the need for revision surgery, among others, and the patient was willing to proceed.  OPERATIVE IMPLANTS: None  OPERATIVE FINDINGS: Minimal petechial bleeding with no abscess no necrotic muscle  OPERATIVE PROCEDURE: Patient was brought to the operating room and underwent a general anesthetic. After adequate levels of anesthesia were obtained patient's left lower extremity was prepped using DuraPrep draped into a sterile field. A timeout was called. Elliptical incision was made along the medial border of the first ray around the plantar ulcer as well as around the great toe. The bone and soft tissue and ulcer were resected in 1 block of tissue. Patient also had ulceration was extended down to the second metatarsal head and the second metatarsal head was also resected. The wound was irrigated hemostasis was obtained. The tissue was viable. This incision was closed using 2-0 nylon. A sterile compressive dressing was applied. Patient was extubated taken to the PACU in stable condition.

## 2015-01-23 NOTE — Progress Notes (Signed)
   [facility] [place]  Subjective  - Scared about surgery.   Objective 114/62 82 97.8 F (36.6 C) (Oral) 18 100%  Intake/Output Summary (Last 24 hours) at 01/23/15 [id number] Last data filed at 01/23/15 [id number]  Gross per 24 hour  Intake    720 ml  Output   1200 ml  Net   -480 ml   Right groin soft non tender to palpation without hematoma    Assessment/Planning: POD # 2 angiogram with lower extremity run off The patient has single-vessel runoff on the left through the posterior tibial artery.  He has a reasonable chance of healing his left foot amputation.  Dr. Lajoyce Cornersuda is planning left first ray amputation today.  Clinton GallantCOLLINS, Hollis Oh [facility] Medical CenterMAUREEN 01/23/2015 8:12 AM --  Laboratory Lab Results:  Recent Labs  01/22/15 0532 01/23/15 0415  WBC 6.6 6.4  HGB 11.6* 11.9*  HCT 36.4* 36.5*  PLT 198 202   BMET  Recent Labs  01/22/15 0532 01/23/15 0415  NA 138 135  K 4.6 4.8  CL 105 103  CO2 22 25  GLUCOSE 158* 134*  BUN 17 15  CREATININE 1.25 1.18  CALCIUM 9.7 9.7    COAG Lab Results  Component Value Date   INR 1.11 01/23/2015   INR 1.07 10/09/2009   No results found for: PTT

## 2015-01-23 NOTE — Transfer of Care (Signed)
Immediate Anesthesia Transfer of Care Note  Patient: [name]  Procedure(s) Performed: Procedure(s): FIRST RAY AMPUTATION/LEFT FOOT (Left)  Patient Location: PACU  Anesthesia Type:General  Level of Consciousness: awake and alert   Airway & Oxygen Therapy: Patient Spontanous Breathing and Patient connected to nasal cannula oxygen  Post-op Assessment: Report given to RN, Post -op Vital signs reviewed and stable and Patient moving all extremities  Post vital signs: Reviewed and stable  Last Vitals:  Filed Vitals:   01/23/15 1050  BP: 137/57  Pulse: 89  Temp: 36.6 C  Resp: 18    Complications: No apparent anesthesia complications

## 2015-01-23 NOTE — Anesthesia Preprocedure Evaluation (Addendum)
Anesthesia Evaluation  Patient identified by MRN, date of birth, ID band Patient awake    Reviewed: Allergy & Precautions, H&P , NPO status , Patient's Chart, lab work & pertinent test results  Airway Mallampati: II  TM Distance: >3 FB Neck ROM: Full    Dental no notable dental hx. (+) Teeth Intact, Dental Advidsory Given   Pulmonary sleep apnea and Continuous Positive Airway Pressure Ventilation , former smoker,  breath sounds clear to auscultation  Pulmonary exam normal       Cardiovascular hypertension, + Peripheral Vascular Disease Rhythm:Regular Rate:Normal     Neuro/Psych PSYCHIATRIC DISORDERS negative neurological ROS  negative psych ROS   GI/Hepatic negative GI ROS, Neg liver ROS, GERD-  Medicated and Controlled,  Endo/Other  diabetes, Insulin Dependent  Renal/GU Renal diseasenegative Renal ROS  negative genitourinary   Musculoskeletal negative musculoskeletal ROS (+)   Abdominal   Peds negative pediatric ROS (+)  Hematology negative hematology ROS (+) anemia ,   Anesthesia Other Findings   Reproductive/Obstetrics negative OB ROS                            Anesthesia Physical Anesthesia Plan  ASA: III  Anesthesia Plan: General   Post-op Pain Management:    Induction: Intravenous  Airway Management Planned: Oral ETT  Additional Equipment:   Intra-op Plan:   Post-operative Plan: Extubation in OR  Informed Consent: I have reviewed the patients History and Physical, chart, labs and discussed the procedure including the risks, benefits and alternatives for the proposed anesthesia with the patient or authorized representative who has indicated his/her understanding and acceptance.   Dental advisory given and Dental Advisory Given  Plan Discussed with: CRNA and Surgeon  Anesthesia Plan Comments:        Anesthesia Quick Evaluation

## 2015-01-23 NOTE — Progress Notes (Signed)
Orthopedic Tech Progress Note Patient Details:  [name] DikeClarence [name] 1951-02-10 [id number]  Ortho Devices Type of Ortho Device: Postop shoe/boot Ortho Device/Splint Location: lle Ortho Device/Splint Interventions: Application   [name] 01/23/2015, 3:31 PM

## 2015-01-23 NOTE — Progress Notes (Addendum)
[facility] PROGRESS NOTE  [name] ZOX:[id number] DOB: 13-Jun-1951 DOA: 01/12/2015 PCP: No primary care provider on file.  Assessment/Plan: #1 ischemic ulcer left great toe MTP joint/diabetic foot ulcer Patient is currently afebrile. WBC trending down. Wound cultures growing Citrobacter Braakii. Patient is status post irrigation and excisional debridement of left foot abscess. Continue oral ciprofloxacin. ABIs have been done, and have calcified vessels with monophasic wave forms showing significant peripheral vascular disease. Patient has been seen in consultation by vascular surgery and patient underwent arteriogram on Monday, 01/21/2015 , results as below. Vascular and orthopedic surgery following. Continue empiric antibiotics. FIRST RAY AMPUTATION/LEFT FOOT, second metatarsal amputation 4 /27  #2 poorly controlled type 2 diabetes mellitus Hemoglobin A1c is 11.8. CBGs have ranged from 134-221. Increase Lantus to 20 units daily. Continue SSI.   #3 significant peripheral vascular disease Per ABIs. Patient has been seen in consultation by vascular surgery. Patient has an ischemic left great toe. Patient has been seen by vascular surgery and patient subsequently underwent arteriogram on Monday, 01/21/2015. Patient noted to have a single vessel runoff on the left through the posterior tibial artery. The peroneal and distal anterior tibial arteries were occluded with no options for revascularization on the left. Is felt by vascular surgery that patient has a reasonable chance of healing post oh amputation. Continue risk factor modification. Continue enteric-coated aspirin. Fasting lipid panel with LDL of 65. FIRST RAY AMPUTATION/LEFT FOOT, second metatarsal amputation today  #4 hypertension Stable. Continue Norvasc, prazosin.  #5 leukocytosis Likely secondary to problem #1. WBC trending down. Continue oral ciprofloxacin.  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 Continue  current regimen of tacrolimus and CellCept. Anti emetics prn.  #7 prophylaxis Heparin for DVT prophylaxis.  Code Status: Full Family Communication: Updated patient. No family at bedside. Disposition Plan PT/OT eval   Consultants:  Orthopedics: Dr. Lajoyce Corners 01/14/2015  Vascular surgery: Dr. Waverly Ferrari 01/17/2015  Procedures:  X-ray left foot 01/12/2015  Irrigation and excisional Debridement left foot abscess per Dr. Magnus Ivan 01/13/2015  ABI 01/16/15  Ultrasound-guided access of right common femoral artery. Aortogram with bilateral iliac arteriogram. Selective catheterization of the left external iliac artery with left lower extremity runoff per Dr. Edilia Bo 01/21/2015  Antibiotics:  IV vancomycin 01/13/2015>>>>01/17/15  IV ciprofloxacin 01/16/2015>>>>01/18/15  IV Zosyn 01/12/2015>>> 01/16/2015  Oral ciprofloxacin 01/18/2015  HPI/Subjective:  CBG 209, nothing by mouth. Patient denies any shortness of breath. Patient denies any chest pain.  Objective: Filed Vitals:   01/23/15 1246  BP:   Pulse: 84  Temp:   Resp: 14    Intake/Output Summary (Last 24 hours) at 01/23/15 1322 Last data filed at 01/23/15 1227  Gross per 24 hour  Intake    540 ml  Output   1200 ml  Net   -660 ml   Filed Weights   01/13/15 0237  Weight: 82.781 kg (182 lb 8 oz)    Exam:   General:  NAD  Cardiovascular: RRR  Respiratory: CTAB  Abdomen: Soft, nontender, nondistended, positive bowel sounds.  Musculoskeletal: No clubbing cyanosis or edema. Left lower foot bandaged. RUE fistula  Data Reviewed: Basic Metabolic Panel:  Recent Labs Lab 01/18/15 0652 01/19/15 0549 01/20/15 0525 01/21/15 0425 01/22/15 0532 01/23/15 0415  NA 139 140 139 136 138 135  K 4.5 4.8 4.5 4.4 4.6 4.8  CL 103 104 103 100 105 103  CO2 GLUCOSE 172* 132* 131* 141* 158* 134*  BUN 15  CREATININE 1.10 1.21 1.33 1.30 1.25 1.18  CALCIUM 9.3 9.4 9.5 9.6 9.7 9.7   PHOS 2.7  --   --   --   --   --    Liver Function Tests:  Recent Labs Lab 01/18/15 0652  ALBUMIN 3.1*   No results for input(s): LIPASE, AMYLASE in the last 168 hours. No results for input(s): AMMONIA in the last 168 hours. CBC:  Recent Labs Lab 01/18/15 0652 01/19/15 0549 01/21/15 0425 01/22/15 0532 01/23/15 0415  WBC 7.2 7.8 6.6 6.6 6.4  HGB 12.5* 12.3* 12.6* 11.6* 11.9*  HCT 38.6* 38.5* 38.8* 36.4* 36.5*  MCV 81.3 82.6 81.3 81.3 81.1  PLT 201 181 199 198 202   Cardiac Enzymes: No results for input(s): CKTOTAL, CKMB, CKMBINDEX, TROPONINI in the last 168 hours. BNP (last 3 results) No results for input(s): BNP in the last 8760 hours.  ProBNP (last 3 results) No results for input(s): PROBNP in the last 8760 hours.  CBG:  Recent Labs Lab 01/22/15 1624 01/22/15 2139 01/23/15 0617 01/23/15 1037 01/23/15 1227  GLUCAP 113* 145* 141* 209* 221*    Recent Results (from the past 240 hour(s))  Surgical pcr screen     Status: None   Collection Time: 01/13/15  2:53 PM  Result Value Ref Range Status   MRSA, PCR NEGATIVE NEGATIVE Final   Staphylococcus aureus NEGATIVE NEGATIVE Final    Comment:        The Xpert SA Assay (FDA approved for NASAL specimens in patients over 51 years of age), is one component of a comprehensive surveillance program.  Test performance has been validated by [facility] for patients greater than or equal to 45 year old. It is not intended to diagnose infection nor to guide or monitor treatment.   Wound culture     Status: None   Collection Time: 01/13/15  4:00 PM  Result Value Ref Range Status   Specimen Description WOUND  Final   Special Requests LEFT FOOT  Final   Gram Stain   Final    MODERATE WBC PRESENT, PREDOMINANTLY PMN FEW SQUAMOUS EPITHELIAL CELLS PRESENT MODERATE GRAM POSITIVE COCCI IN PAIRS IN CHAINS IN CLUSTERS Performed at [facility]    Culture   Final    ABUNDANT CITROBACTER BRAAKII Performed at  [facility]    Report Status 01/16/2015 FINAL  Final   Organism ID, Bacteria CITROBACTER BRAAKII  Final      Susceptibility   Citrobacter braakii - MIC*    CEFAZOLIN >=64 RESISTANT Resistant     CEFEPIME <=1 SENSITIVE Sensitive     CEFTAZIDIME <=1 SENSITIVE Sensitive     CEFTRIAXONE <=1 SENSITIVE Sensitive     CIPROFLOXACIN <=0.25 SENSITIVE Sensitive     GENTAMICIN <=1 SENSITIVE Sensitive     IMIPENEM 0.5 SENSITIVE Sensitive     PIP/TAZO <=4 SENSITIVE Sensitive     TOBRAMYCIN <=1 SENSITIVE Sensitive     TRIMETH/SULFA <=20 SENSITIVE Sensitive     * ABUNDANT CITROBACTER BRAAKII     Studies: No results found.  Scheduled Meds: . [MAR Hold] allopurinol  300 mg Oral BID  . [MAR Hold] amLODipine  10 mg Oral q morning - 10a  . [MAR Hold] aspirin EC  81 mg Oral Daily  .  ceFAZolin (ANCEF) IV  2 g Intravenous To OR  . [MAR Hold] ciprofloxacin  500 mg Oral BID  . [MAR Hold] enoxaparin (LOVENOX) injection  40 mg Subcutaneous Q24H  . fentaNYL      . [  MAR Hold] insulin aspart  0-15 Units Subcutaneous TID WC  . [MAR Hold] insulin aspart  0-5 Units Subcutaneous QHS  . [MAR Hold] insulin glargine  16 Units Subcutaneous Daily  . [MAR Hold] insulin glargine  25 Units Subcutaneous Once  . [MAR Hold] mycophenolate  1,000 mg Oral BID  . [MAR Hold] pantoprazole  40 mg Oral Daily  . [MAR Hold] prazosin  6 mg Oral QHS  . [MAR Hold] sertraline  50 mg Oral QHS  . [MAR Hold] simvastatin  20 mg Oral QHS  . [MAR Hold] tacrolimus  5 mg Oral BID   Continuous Infusions: . sodium chloride 75 mL/hr at 01/23/15 0800  . lactated ringers 50 mL/hr at 01/23/15 1128    Principal Problem:   Cellulitis and abscess of foot excluding toe Active Problems:   Essential hypertension   Cellulitis and abscess of foot   Diabetes mellitus without complication   Leukocytosis   PVD (peripheral vascular disease)   Ischemic ulcer of toe   Peripheral vascular disease    Time spent: 35  mins    [facility] Behavioral HealthcareBROL,Boston Catarino MD [facility] Pager [phone]. If 7PM-7AM, please contact night-coverage at [email or web address], password [email or web address] 01/23/2015, 1:22 PM  LOS: 10 days

## 2015-01-23 NOTE — Progress Notes (Signed)
[facility].  Patient blood sugar prior to transfer 209.  Reported to [facility] RN that patient had 5 bites of oatmeal this morning despite being informed that he was to be NPO.  Okay per Dr. Okey Dupreose to proceed.  CHG bath performed and pre-procedure checklist complete.  Patient transported in bed with IVF infusing. Vitals stable.

## 2015-01-24 ENCOUNTER — Encounter (HOSPITAL_COMMUNITY): Payer: Self-pay | Admitting: Orthopedic Surgery

## 2015-01-24 LAB — GLUCOSE, CAPILLARY
Glucose-Capillary: 182 mg/dL — ABNORMAL HIGH (ref 70–99)
Glucose-Capillary: 148 mg/dL — ABNORMAL HIGH (ref 70–99)
Glucose-Capillary: 138 mg/dL — ABNORMAL HIGH (ref 70–99)
Glucose-Capillary: 85 mg/dL (ref 70–99)
Glucose-Capillary: 86 mg/dL (ref 70–99)

## 2015-01-24 NOTE — Clinical Social Work Note (Signed)
Clinical Social Work Assessment  Patient Details  Name: [name] MRN: [id number] Date of Birth: [birth year]  Date of referral:  01/24/15               Reason for consult:  Facility Placement                Permission sought to share information with:  Facility Medical sales representativeContact Representative, Family Supports Permission granted to share information::  Yes, Verbal Permission Granted  Name::        Agency::  SNF facilities  Relationship::     Contact Information:     Housing/Transportation Living arrangements for the past 2 months:  Single Family Home Source of Information:  Patient Patient Interpreter Needed:  None Criminal Activity/Legal Involvement Pertinent to Current Situation/Hospitalization:  No - Comment as needed Significant Relationships:  Spouse Lives with:  Adult Children, Spouse Do you feel safe going back to the place where you live?  Yes Need for family participation in patient care:  No (Coment)  Care giving concerns:  Patient does not have any care given concerns, patient lives with his wife and adult child lives with them.   Social Worker assessment / plan:  Patient is a 64 year old male who is alert and oriented x4.  Patient is pleasant and friendly to talk to, patient was informed of CSW role in case patient does need to go to SNF.  CSW asked patient if he has ever been he stated he has not, CSW explained process of determining if patient needs to go to a SNF or not.  Patient expressed he would rather go home if he can, but will think about going to SNF if he has to.  Patient was explained the options for facilities and also given a list of places that take patients for short term rehab.  Patient expressed he did not feel like he had any concerns about going to SNF, and hopes he can return back home with home health.  Patient gave CSW permission to start process for SNF placement, [place]  Employment status:  Disabled (Comment on whether  or not currently receiving Disability) Insurance information:  Medicare PT Recommendations:  Skilled Nursing Facility Information / Referral to community resources:  Skilled Nursing Facility  Patient/Family's Response to care:  Patient will decide if he wants to go to SNF and inform CSW.  Patient/Family's Understanding of and Emotional Response to Diagnosis, Current Treatment, and Prognosis:  Patient expressed he understands his diagnosis and just want to get better.  Emotional Assessment Appearance:  Appears stated age Attitude/Demeanor/Rapport:    Affect (typically observed):  Accepting, Appropriate, Pleasant Orientation:  Oriented to Self, Oriented to Place, Oriented to  Time, Oriented to Situation Alcohol / Substance use:  Never Used Psych involvement (Current and /or in the community):  No (Comment)  Discharge Needs  Concerns to be addressed:  No discharge needs identified Readmission within the last 30 days:  No Current discharge risk:  None Barriers to Discharge:  No Barriers Identified   Ervin KnackEric R. Walter Grima, MSW, Theresia MajorsLCSWA [phone] 01/24/2015 4:36 PM

## 2015-01-24 NOTE — Anesthesia Postprocedure Evaluation (Signed)
  Anesthesia Post-op Note  Patient: [name]  Procedure(s) Performed: Procedure(s) (LRB): FIRST RAY AMPUTATION/LEFT FOOT (Left)  Patient Location: PACU  Anesthesia Type: General  Level of Consciousness: awake and alert   Airway and Oxygen Therapy: Patient Spontanous Breathing  Post-op Pain: mild  Post-op Assessment: Post-op Vital signs reviewed, Patient's Cardiovascular Status Stable, Respiratory Function Stable, Patent Airway and No signs of Nausea or vomiting  Last Vitals:  Filed Vitals:   01/24/15 1323  BP: 147/63  Pulse: 95  Temp: 36.7 C  Resp: 18    Post-op Vital Signs: stable   Complications: No apparent anesthesia complications

## 2015-01-24 NOTE — Progress Notes (Addendum)
[facility] PROGRESS NOTE  [name] WJX:[id number] DOB: [birth year] DOA: 01/12/2015 PCP: No primary care provider on file.  Assessment/Plan: #1 ischemic ulcer left great toe MTP joint/diabetic foot ulcer Patient is currently afebrile. WBC trending down. Wound cultures growing Citrobacter Braakii. Patient is status post irrigation and excisional debridement of left foot abscess. Continue oral ciprofloxacin. ABIs have been done, and have calcified vessels with monophasic wave forms showing significant peripheral vascular disease. Patient has been seen in consultation by vascular surgery and patient underwent arteriogram on Monday, 01/21/2015 , results as below. Vascular and orthopedic surgery following. Continue empiric antibiotics. FIRST RAY AMPUTATION/LEFT FOOT, second metatarsal amputation 4 /27 Physical therapy evaluation incomplete Anticipate discharge home. 4/29  #2 poorly controlled type 2 diabetes mellitus Hemoglobin A1c is 11.8. CBGs have improved, Continue Lantus to 20 units daily. Continue SSI.    #3 significant peripheral vascular disease Per ABIs. Patient has been seen in consultation by vascular surgery. Patient has an ischemic left great toe. Patient has been seen by vascular surgery and patient subsequently underwent arteriogram on Monday, 01/21/2015. Patient noted to have a single vessel runoff on the left through the posterior tibial artery. The peroneal and distal anterior tibial arteries were occluded with no options for revascularization on the left. Is felt by vascular surgery that patient has a reasonable chance of healing post oh amputation. Continue risk factor modification. Continue enteric-coated aspirin. Fasting lipid panel with LDL of 65. FIRST RAY AMPUTATION/LEFT FOOT, second metatarsal amputation  4/27  #4 hypertension Stable. Continue Norvasc, prazosin.  #5 leukocytosis Likely secondary to problem #1. WBC trending down. Continue oral ciprofloxacin.  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 Continue current regimen of tacrolimus and CellCept. Anti emetics prn.  #7 prophylaxis Heparin for DVT prophylaxis.  Code Status: Full Family Communication: Updated patient. No family at bedside. Disposition pending PT/OT evaluation, patient will DC home.4/ 29   Consultants:  Orthopedics: Dr. Lajoyce Corners 01/14/2015  Vascular surgery: Dr. Waverly Ferrari 01/17/2015  Procedures:  X-ray left foot 01/12/2015  Irrigation and excisional Debridement left foot abscess per Dr. Magnus Ivan 01/13/2015  ABI 01/16/15  Ultrasound-guided access of right common femoral artery. Aortogram with bilateral iliac arteriogram. Selective catheterization of the left external iliac artery with left lower extremity runoff per Dr. Edilia Bo 01/21/2015  Antibiotics:  IV vancomycin 01/13/2015>>>>01/17/15  IV ciprofloxacin 01/16/2015>>>>01/18/15  IV Zosyn 01/12/2015>>> 01/16/2015  Oral ciprofloxacin 01/18/2015  HPI/Subjective: Patient states that he ambulated early morning today, still has some pain with ambulation  Objective: Filed Vitals:   01/24/15 1323  BP: 147/63  Pulse: 95  Temp: 98.1 F (36.7 C)  Resp: 18    Intake/Output Summary (Last 24 hours) at 01/24/15 1342 Last data filed at 01/24/15 1300  Gross per 24 hour  Intake    720 ml  Output      0 ml  Net    720 ml   Filed Weights   01/13/15 0237  Weight: 82.781 kg (182 lb 8 oz)    Exam:   General:  NAD  Cardiovascular: RRR  Respiratory: CTAB  Abdomen: Soft, nontender, nondistended, positive bowel sounds.  Musculoskeletal: No clubbing cyanosis or edema. Left lower foot bandaged. RUE fistula  Data Reviewed: Basic Metabolic Panel:  Recent Labs Lab 01/18/15 0652 01/19/15 0549 01/20/15 0525 01/21/15 0425 01/22/15 0532 01/23/15 0415  NA 139 140 139 136 138 135  K 4.5 4.8 4.5 4.4 4.6 4.8  CL 103 104 103 100  105 103  CO2  27 24 25 25 22 25   GLUCOSE 172* 132* 131* 141* 158* 134*  BUN 10 13 15 16 17 15   CREATININE 1.10 1.21 1.33 1.30 1.25 1.18  CALCIUM 9.3 9.4 9.5 9.6 9.7 9.7  PHOS 2.7  --   --   --   --   --    Liver Function Tests:  Recent Labs Lab 01/18/15 0652  ALBUMIN 3.1*   No results for input(s): LIPASE, AMYLASE in the last 168 hours. No results for input(s): AMMONIA in the last 168 hours. CBC:  Recent Labs Lab 01/18/15 0652 01/19/15 0549 01/21/15 0425 01/22/15 0532 01/23/15 0415  WBC 7.2 7.8 6.6 6.6 6.4  HGB 12.5* 12.3* 12.6* 11.6* 11.9*  HCT 38.6* 38.5* 38.8* 36.4* 36.5*  MCV 81.3 82.6 81.3 81.3 81.1  PLT 201 181 199 198 202   Cardiac Enzymes: No results for input(s): CKTOTAL, CKMB, CKMBINDEX, TROPONINI in the last 168 hours. BNP (last 3 results) No results for input(s): BNP in the last 8760 hours.  ProBNP (last 3 results) No results for input(s): PROBNP in the last 8760 hours.  CBG:  Recent Labs Lab 01/23/15 1227 01/23/15 1659 01/23/15 2105 01/24/15 0626 01/24/15 1126  GLUCAP 221* 182* 194* 148* 138*    No results found for this or any previous visit (from the past 240 hour(s)).   Studies: No results found.  Scheduled Meds: . allopurinol  300 mg Oral BID  . amLODipine  10 mg Oral q morning - 10a  . aspirin EC  81 mg Oral Daily  . ciprofloxacin  500 mg Oral BID  . enoxaparin (LOVENOX) injection  40 mg Subcutaneous Q24H  . insulin aspart  0-15 Units Subcutaneous TID WC  . insulin aspart  0-5 Units Subcutaneous QHS  . insulin glargine  20 Units Subcutaneous Daily  . mycophenolate  1,000 mg Oral BID  . pantoprazole  40 mg Oral Daily  . prazosin  6 mg Oral QHS  . simvastatin  20 mg Oral QHS  . tacrolimus  5 mg Oral BID   Continuous Infusions: . sodium chloride    . lactated ringers 50 mL/hr at 01/23/15 1128    Principal Problem:   Cellulitis and abscess of foot excluding toe Active Problems:   Essential hypertension   Cellulitis and abscess of foot    Diabetes mellitus without complication   Leukocytosis   PVD (peripheral vascular disease)   Ischemic ulcer of toe   Peripheral vascular disease    Time spent: 35 mins    [facility] CenterBROL,Alexandrya Chim MD [facility] Pager [phone]. If 7PM-7AM, please contact night-coverage at [email or web address], password [email or web address] 01/24/2015, 1:42 PM  LOS: 11 days

## 2015-01-24 NOTE — Progress Notes (Signed)
Patient ID: [name] DikeClarence [name], male   DOB: 24-Jun-1951, 64 y.o.   MRN: [id number] Postoperative day 1 left foot first ray amputation. Patient complains of pain and some of the Cob[place] was removed. Patient states he felt much better. Physical therapy progressive ambulation minimize weightbearing on the left lower extremity. Patient may be discharged once he is safe with ambulation or may be discharged to skilled nursing.

## 2015-01-24 NOTE — Progress Notes (Signed)
Physical Therapy Treatment Patient Details Name: [name] DikeClarence [name] MRN: [id number] DOB: 1951/05/27 Today's Date: 01/24/2015    History of Present Illness Pt is a 64 y/o M s/p irrigation and excisional debridement of L foot abscess on plantar aspect of foot under 1st ray.  Pt's PMH includes PTSD, legally blind in L eye, DM, renal disorder.    PT Comments    Pt progressing towards physical therapy goals. Was able to demonstrate increased tolerance for functional activity this session and ambulated 100' with RW and maintained NWB status well. Will progress to stair training next session.   Follow Up Recommendations  Supervision for mobility/OOB;No PT follow up     Equipment Recommendations  Rolling walker with 5" wheels;3in1 (PT);Other (comment) ([facility])    Recommendations for Other Services       Precautions / Restrictions Precautions Precautions: Fall Restrictions Weight Bearing Restrictions: Yes LLE Weight Bearing: Non weight bearing    Mobility  Bed Mobility Overal bed mobility: Modified Independent             General bed mobility comments: Was able to transition to EOB without railings or assist  Transfers Overall transfer level: Needs assistance Equipment used: Rolling walker (2 wheeled) Transfers: Sit to/from Stand Sit to Stand: Supervision         General transfer comment: Pt was able to power-up to full standing position with no physical assistance. Supervision for safety as pt somewhat unsteady at times.   Ambulation/Gait Ambulation/Gait assistance: Supervision;Min guard Ambulation Distance (Feet): 100 Feet Assistive device: Rolling walker (2 wheeled) Gait Pattern/deviations: Decreased stride length;Step-to pattern;Trunk flexed Gait velocity: Decreased Gait velocity interpretation: Below normal speed for age/gender General Gait Details: Increased endurance noted this session. Pt was cued for standing rest  breaks. Overall pt has good control over walker and maintains NWB status well.    Stairs            Wheelchair Mobility    Modified Rankin (Stroke Patients Only)       Balance Overall balance assessment: Needs assistance Sitting-balance support: Feet supported;No upper extremity supported Sitting balance-Leahy Scale: Good     Standing balance support: Bilateral upper extremity supported Standing balance-Leahy Scale: Poor Standing balance comment: Pt requires UE support to maintain NWB status on the LLE                    Cognition Arousal/Alertness: Awake/alert Behavior During Therapy: WFL for tasks assessed/performed Overall Cognitive Status: Within Functional Limits for tasks assessed                      Exercises Total Joint Exercises Ankle Circles/Pumps: 20 reps Long Arc Quad: 10 reps    General Comments General comments (skin integrity, edema, etc.): Pt is legally blind in L eye and wears glasses for reading and all other activities.      Pertinent Vitals/Pain Pain Assessment: Faces Faces Pain Scale: Hurts little more Pain Location: L foot Pain Descriptors / Indicators: Aching Pain Intervention(s): Limited activity within patient's tolerance;Monitored during session;Repositioned    Home Living                      Prior Function            PT Goals (current goals can now be found in the care plan section) Acute Rehab PT Goals Patient Stated Goal: none stated PT Goal Formulation: With patient Time  For Goal Achievement: 01/21/15 Potential to Achieve Goals: Good Progress towards PT goals: Progressing toward goals    Frequency  Min 3X/week    PT Plan Current plan remains appropriate;Frequency needs to be updated    Co-evaluation             End of Session Equipment Utilized During Treatment: Gait belt Activity Tolerance: Patient tolerated treatment well;Patient limited by fatigue Patient left: in chair;with call  bell/phone within reach;with family/visitor present     Time: 7829-5621 PT Time Calculation (min) (ACUTE ONLY): 17 min  Charges:  $Gait Training: 8-22 mins                    G Codes:      Conni Slipper 2015-02-23, 3:33 PM   Conni Slipper, PT, DPT Acute Rehabilitation Services Pager: [phone]

## 2015-01-24 NOTE — Progress Notes (Signed)
PT Cancellation Note  Patient Details Name: [name] MRN: [id number] DOB: Jul 08, 1951   Cancelled Treatment:    Reason Eval/Treat Not Completed: Patient declined, stating he wants to wait until his visitor leaves. Pt was educated on the importance of progressive mobility, and pt asks that PT check back later today. Will return as schedule allows.    Conni SlipperKirkman, Leontyne Manville 01/24/2015, 9:43 AM   Conni SlipperLaura Sebrena Engh, PT, DPT Acute Rehabilitation Services Pager: [phone]

## 2015-01-25 LAB — GLUCOSE, CAPILLARY
Glucose-Capillary: 86 mg/dL (ref 70–99)
Glucose-Capillary: 194 mg/dL — ABNORMAL HIGH (ref 70–99)

## 2015-01-25 MED ORDER — INSULIN DETEMIR 100 UNIT/ML FLEXPEN: 10.0000 [IU] | PEN_INJECTOR | Freq: Every day | SUBCUTANEOUS | Status: DC

## 2015-01-25 MED ORDER — BENZONATATE 100 MG PO CAPS: 100.0000 mg | ORAL_CAPSULE | Freq: Three times a day (TID) | ORAL | Status: DC | PRN

## 2015-01-25 MED ORDER — PANTOPRAZOLE SODIUM 40 MG PO TBEC: 40.0000 mg | DELAYED_RELEASE_TABLET | Freq: Every day | ORAL | Status: DC

## 2015-01-25 MED ORDER — METHOCARBAMOL 500 MG PO TABS: 500.0000 mg | ORAL_TABLET | Freq: Four times a day (QID) | ORAL | Status: DC | PRN

## 2015-01-25 MED ORDER — ASPIRIN 81 MG PO TBEC: 81.0000 mg | DELAYED_RELEASE_TABLET | Freq: Every day | ORAL | Status: DC

## 2015-01-25 MED ORDER — CIPROFLOXACIN HCL 500 MG PO TABS: 500.0000 mg | ORAL_TABLET | Freq: Two times a day (BID) | ORAL | Status: DC

## 2015-01-25 MED ORDER — OXYCODONE HCL 5 MG PO TABS: 5.0000 mg | ORAL_TABLET | ORAL | Status: DC | PRN

## 2015-01-25 MED ORDER — INSULIN GLARGINE 100 UNIT/ML ~~LOC~~ SOLN: 10.0000 [IU] | Freq: Every day | SUBCUTANEOUS | Status: DC

## 2015-01-25 MED ORDER — FREESTYLE LANCETS MISC: Status: DC

## 2015-01-25 NOTE — Progress Notes (Signed)
Medicare Important Message given? YES  (If response is "NO", the following Medicare IM given date fields will be blank)  Date Medicare IM given: 01/25/15 Medicare IM given by:  Milia Warth  

## 2015-01-25 NOTE — Clinical Social Work Note (Signed)
CSW received referral for SNF.  Case discussed with case manager and plan is to discharge home with home health.  CSW to sign off please re-consult if social work needs arise.  Sharnae Winfree R. Alayzha An, MSW, LCSWA [phone]  

## 2015-01-25 NOTE — Progress Notes (Signed)
Physical Therapy Treatment Patient Details Name: [name] MRN: [id number] DOB: 1951/04/27 Today's Date: 01/25/2015    History of Present Illness Pt is a 64 y/o M s/p irrigation and excisional debridement of L foot abscess on plantar aspect of foot under 1st ray.  Pt's PMH includes PTSD, legally blind in L eye, DM, renal disorder.    PT Comments    Pt progressing towards physical therapy goals. Was able to perform transfers and ambulation with supervision/close guard. Pt negotiated 1 step with min assist for balance/support. He was instructed to ascend backwards and descend forwards. Do not anticipate that pt will have difficulty entering his home at d/c. Pt expecting to return home this afternoon after wife gets off work.    Follow Up Recommendations  Supervision for mobility/OOB;No PT follow up     Equipment Recommendations  Rolling walker with 5" wheels;3in1 (PT);Other (comment)    Recommendations for Other Services       Precautions / Restrictions Precautions Precautions: Fall Restrictions Weight Bearing Restrictions: Yes LLE Weight Bearing: Non weight bearing    Mobility  Bed Mobility               General bed mobility comments: Pt sitting up in recliner upon PT arrival.   Transfers Overall transfer level: Needs assistance Equipment used: Rolling walker (2 wheeled) Transfers: Sit to/from Stand Sit to Stand: Supervision         General transfer comment: Pt was able to power-up to full standing position with no physical assistance. Supervision for safety as pt somewhat unsteady at times.   Ambulation/Gait Ambulation/Gait assistance: Supervision Ambulation Distance (Feet): 10 Feet Assistive device: Rolling walker (2 wheeled) Gait Pattern/deviations: Step-to pattern;Decreased stride length;Trunk flexed Gait velocity: Decreased Gait velocity interpretation: Below normal speed for age/gender General Gait Details: Ambulation to/from steps only.     Stairs Stairs: Yes Stairs assistance: Min assist  Stair Management: Backwards;With walker Number of Stairs: 1 General stair comments: Pt was educated in sequencing/technique to hop to first step. Midfoot at edge of step and pt was able to scoot his foot backward until full foot was supported.   Wheelchair Mobility    Modified Rankin (Stroke Patients Only)       Balance Overall balance assessment: Needs assistance Sitting-balance support: Feet supported;No upper extremity supported Sitting balance-Leahy Scale: Good     Standing balance support: Bilateral upper extremity supported Standing balance-Leahy Scale: Poor Standing balance comment: Pt requires UE support to maintain NWB status on the LLE                    Cognition Arousal/Alertness: Awake/alert Behavior During Therapy: WFL for tasks assessed/performed Overall Cognitive Status: Within Functional Limits for tasks assessed                      Exercises      General Comments General comments (skin integrity, edema, etc.): Pt is legally blind in L eye and wears glasses for reading and all other activities.      Pertinent Vitals/Pain Pain Assessment: Faces Faces Pain Scale: Hurts even more Pain Location: L foot at amputation site when foot is in dependent position.  Pain Descriptors / Indicators: Aching Pain Intervention(s): Limited activity within patient's tolerance;Monitored during session;Repositioned    Home Living                      Prior Function  PT Goals (current goals can now be found in the care plan section) Acute Rehab PT Goals Patient Stated Goal: none stated PT Goal Formulation: With patient Time For Goal Achievement: 01/28/15 Potential to Achieve Goals: Good Progress towards PT goals: Progressing toward goals    Frequency  Min 3X/week    PT Plan Current plan remains appropriate;Frequency needs to be updated    Co-evaluation              End of Session Equipment Utilized During Treatment: Gait belt Activity Tolerance: Patient tolerated treatment well;Patient limited by fatigue Patient left: in chair;with call bell/phone within reach;with family/visitor present     Time: 1610-9604 PT Time Calculation (min) (ACUTE ONLY): 32 min  Charges:  $Gait Training: 23-37 mins                    G Codes:      Conni Slipper Feb 15, 2015, 2:06 PM   Conni Slipper, PT, DPT Acute Rehabilitation Services Pager: [phone]

## 2015-01-25 NOTE — Care Management Note (Signed)
    Page 1 of 2   01/25/2015     1:09:44 PM CARE MANAGEMENT NOTE 01/25/2015  Patient:  [name],[name]   Account Number:  [id number]  Date Initiated:  01/18/2015  Documentation initiated by:  Vance PeperBRADY,SUSAN  Subjective/Objective Assessment:   64 yr old male admitted with abscess and cellulitis of the left foot. patient underwent I & D of right foot.     Action/Plan:   Case manager spoke with patient concerning DME needs. Provided patient with information for [facility], to locate PCP. Patient states wife will handle.   Anticipated DC Date:  01/24/2015   Anticipated DC Plan:  HOME W HOME HEALTH SERVICES      DC Planning Services  CM consult      [facility]  DURABLE MEDICAL EQUIPMENT   Choice offered to / List presented to:  C-1 Patient   DME arranged  WALKER - ROLLING  3-N-1      DME agency  [facility]     HH arranged  HH-2 PT  HH-3 OT  HH-4 NURSE'S AIDE      HH agency  [facility]   Status of service:  Completed, signed off Medicare Important Message given?  YES (If response is "NO", the following Medicare IM given date fields will be blank) Date Medicare IM given:  01/22/2015 Medicare IM given by:  Donn PieriniWEBSTER,Leisha Trinkle Date Additional Medicare IM given:  01/25/2015 Additional Medicare IM given by:  Donn PieriniKRISTI Makaveli Hoard  [facility]  Per UR Regulation:  Reviewed for med. necessity/level of care/duration of stay  If discussed at Long Length of Stay Meetings, dates discussed:   01/24/2015    Comments:  01/25/15- 1230- Donn PieriniKristi Derenda Giddings RN, BSN [phone] Pt for d/c home today- orders in for DME and HH needs- spoke with pt at bedside- DME [facility]- [facility]- [facility] [place] [facility]- pt reports that he has a glucometer at home and [facility] [place] [facility]

## 2015-01-25 NOTE — Discharge Summary (Addendum)
Physician Discharge Summary  [name] MRN: [id number] DOB/AGE: [birth year] 64 y.o.  PCP: No primary care provider on file.   Admit date: 01/12/2015 Discharge date: 01/25/2015  Discharge Diagnoses:   Principal Problem:   Cellulitis and abscess of foot excluding toe Active Problems:   Essential hypertension   Cellulitis and abscess of foot   Diabetes mellitus without complication   Leukocytosis   PVD (peripheral vascular disease)   Ischemic ulcer of toe   Peripheral vascular disease FIRST RAY AMPUTATION/LEFT FOOT, second metatarsal amputation PROCEDURE:  1. Ultrasound-guided access of the right common femoral artery 2. Aortogram with bilateral iliac arteriogram 3. Selective catheterization of the left external iliac artery with left lower extremity runoff    Medication List    STOP taking these medications        CORICIDIN HBP COUGH/COLD PO      TAKE these medications        allopurinol 300 MG tablet  Commonly known as:  ZYLOPRIM  Take 300 mg by mouth 2 (two) times daily.     amLODipine 10 MG tablet  Commonly known as:  NORVASC  Take 10 mg by mouth every morning.     aspirin 81 MG EC tablet  Take 1 tablet (81 mg total) by mouth daily.     benzonatate 100 MG capsule  Commonly known as:  TESSALON  Take 1 capsule (100 mg total) by mouth 3 (three) times daily as needed for cough.     ciprofloxacin 500 MG tablet  Commonly known as:  CIPRO  Take 1 tablet (500 mg total) by mouth 2 (two) times daily.     docusate sodium 100 MG capsule  Commonly known as:  COLACE  Take 100 mg by mouth 2 (two) times daily as needed for mild constipation.     freestyle lancets  Use as instructed     glipiZIDE 10 MG tablet  Commonly known as:  GLUCOTROL  Take 10 mg by mouth daily before breakfast.     guaiFENesin-dextromethorphan 100-10 MG/5ML syrup  Commonly known as:  ROBITUSSIN DM  Take 5 mLs by mouth every 4 (four) hours as needed for cough.     Insulin Detemir  100 UNIT/ML Pen  Commonly known as:  LEVEMIR  Inject 10 Units into the skin daily at 10 pm.     methocarbamol 500 MG tablet  Commonly known as:  ROBAXIN  Take 1 tablet (500 mg total) by mouth every 6 (six) hours as needed for muscle spasms.     mirtazapine 15 MG tablet  Commonly known as:  REMERON  Take 7.5 mg by mouth at bedtime as needed (insomnia).     mycophenolate 250 MG capsule  Commonly known as:  CELLCEPT  Take 1,000 mg by mouth 2 (two) times daily.     oxyCODONE 5 MG immediate release tablet  Commonly known as:  Oxy IR/ROXICODONE  Take 1 tablet (5 mg total) by mouth every 4 (four) hours as needed for moderate pain.     pantoprazole 40 MG tablet  Commonly known as:  PROTONIX  Take 1 tablet (40 mg total) by mouth daily.     prazosin 2 MG capsule  Commonly known as:  MINIPRESS  Take 6 mg by mouth at bedtime.     sertraline 100 MG tablet  Commonly known as:  ZOLOFT  Take 50 mg by mouth at bedtime.     simvastatin 40 MG tablet  Commonly known as:  ZOCOR  Take 20  mg by mouth at bedtime.     tacrolimus 1 MG capsule  Commonly known as:  PROGRAF  Take 5 mg by mouth 2 (two) times daily.        Follow-up recommendations In follow-up with PCP in 3-5 days Follow-up with Dr. Lajoyce Cornersuda, orthopedics Follow-up CBC, CMP in 3-5 days     Discharge Condition: Stable   Disposition: 01-Home or Self Care   Consults: Vascular surgery Orthopedics   Significant Diagnostic Studies: Dg Foot 2 Views Left  01/13/2015   CLINICAL DATA:  Left foot pain and infection for 1 week.  Diabetes  EXAM: LEFT FOOT - 2 VIEW  COMPARISON:  None.  FINDINGS: Extensive soft tissue gas in the medial forefoot, around the first MTP joint. Gas overlaps the medial joint, and may reach the medial sesamoid. There is no definitive osteomyelitis or septic arthritis. No dissecting subcutaneous gas.  No acute fracture dislocation. Diffuse arterial calcification consistent with a diabetes history.  IMPRESSION:  Soft tissue gas around the first MTP joint consistent with ulceration or necrotizing infection. Although there is no definitive osteomyelitis, there is very close proximity of bone and gas.   Electronically Signed   By: Marnee SpringJonathon  Watts M.D.   On: 01/13/2015 00:14      Microbiology: No results found for this or any previous visit (from the past 240 hour(s)).   Labs: Results for orders placed or performed during the hospital encounter of 01/12/15 (from the past 48 hour(s))  Glucose, capillary     Status: Abnormal   Collection Time: 01/23/15 12:27 PM  Result Value Ref Range   Glucose-Capillary 221 (H) 70 - 99 mg/dL   Comment 1 Notify RN    Comment 2 Document in Chart   Glucose, capillary     Status: Abnormal   Collection Time: 01/23/15  4:59 PM  Result Value Ref Range   Glucose-Capillary 182 (H) 70 - 99 mg/dL   Comment 1 Notify RN    Comment 2 Document in Chart   Glucose, capillary     Status: Abnormal   Collection Time: 01/23/15  9:05 PM  Result Value Ref Range   Glucose-Capillary 194 (H) 70 - 99 mg/dL   Comment 1 Notify RN    Comment 2 Document in Chart   Glucose, capillary     Status: Abnormal   Collection Time: 01/24/15  6:26 AM  Result Value Ref Range   Glucose-Capillary 148 (H) 70 - 99 mg/dL  Glucose, capillary     Status: Abnormal   Collection Time: 01/24/15 11:26 AM  Result Value Ref Range   Glucose-Capillary 138 (H) 70 - 99 mg/dL   Comment 1 Notify RN   Glucose, capillary     Status: None   Collection Time: 01/24/15  4:28 PM  Result Value Ref Range   Glucose-Capillary 85 70 - 99 mg/dL   Comment 1 Notify RN    Comment 2 Document in Chart   Glucose, capillary     Status: None   Collection Time: 01/24/15  8:46 PM  Result Value Ref Range   Glucose-Capillary 86 70 - 99 mg/dL  Glucose, capillary     Status: None   Collection Time: 01/25/15  6:41 AM  Result Value Ref Range   Glucose-Capillary 86 70 - 99 mg/dL  Glucose, capillary     Status: Abnormal   Collection  Time: 01/25/15 11:33 AM  Result Value Ref Range   Glucose-Capillary 194 (H) 70 - 99 mg/dL   Comment 1 Notify RN  HPI :*64 y.o. male who we've been consulted on regarding a left great toe ulcer. The patient reported a 1-2 week history of left great toe pain, swelling and drainage. He had an I & D by Dr. Magnus Ivan on 01/13/15. Dr. Lajoyce Corners was then consulted for first ray amputation. ABIs were obtained and revealed non-compressible vessels. He denies any trauma. He has a past medical history of poorly controlled diabetes, hypertension and is s/p renal transplant 6 years ago. He denies any prior history of non-healing wounds and rest pain. He denies any fever or chills. He gets intermittent cramps in his left hip and calf bilaterally at rest and with exercise, so this does not fit with typical claudication. He is active and ambulates daily. He is a previous smoker quitting 25 years ago.   HOSPITAL COURSE: #1 ischemic ulcer left great toe MTP joint/diabetic foot ulcer Patient is currently afebrile. WBC trending down. Wound cultures growing Citrobacter Braakii. Patient is status post irrigation and excisional debridement of left foot abscess. Continue oral ciprofloxacin for another 10 days . ABIs showed  calcified vessels with monophasic wave forms showing significant peripheral vascular disease. Patient has been seen in consultation by vascular surgery and patient underwent arteriogram on Monday, 01/21/2015 , results as below. Vascular and orthopedic surgery following.   FIRST RAY AMPUTATION/LEFT FOOT, second metatarsal amputation performed 4 /27 Physical therapy evaluation  recommended home health PT Anticipate discharge home. 4/29  #2 poorly controlled type 2 diabetes mellitus Hemoglobin A1c is 11.8. Patient takes glipizide at home Continue glipizide, will add Levemir at bedtime for better glycemic control   #3 significant peripheral vascular disease Patient was evaluated by vascular surgery.  Patient has an ischemic left great toe. Patient has been seen by vascular surgery and patient subsequently underwent arteriogram on Monday, 01/21/2015. Patient noted to have a single vessel runoff on the left through the posterior tibial artery. The peroneal and distal anterior tibial arteries were occluded with no options for revascularization on the left. Is felt by vascular surgery that patient has a reasonable chance of healing post  amputation. FIRST RAY AMPUTATION/LEFT FOOT, second metatarsal amputation 4/27  Continue enteric-coated aspirin. Fasting lipid panel with LDL of 65.   #4 hypertension Stable. Continue Norvasc, prazosin.  #5 leukocytosis Likely secondary to problem #1. WBC trending down. Continue oral ciprofloxacin. 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 Continue current regimen of tacrolimus and CellCept. Anti emetics prn.  #7 prophylaxis Heparin for DVT prophylaxis.  Code Status: Full   Discharge Exam:    Blood pressure 130/61, pulse 82, temperature 98.3 F (36.8 C), temperature source Oral, resp. rate 18, height  (1.778 m), weight 82.781 kg (182 lb 8 oz), SpO2 95 %.   General: NAD  Cardiovascular: RRR  Respiratory: CTAB  Abdomen: Soft, nontender, nondistended, positive bowel sounds.  Musculoskeletal: No clubbing cyanosis or edema. Left lower foot bandaged. RUE fistula       Discharge Instructions    Diet - low sodium heart healthy    Complete by:  As directed      Increase activity slowly    Complete by:  As directed            Follow-up Information    Follow up with DUDA,MARCUS V, MD In 1 week.   Specialty:  Orthopedic Surgery   Contact information:   [address] [place] [address] [phone]       Follow  up with Primary care provider. Schedule an appointment as soon as possible for a visit in 1 week.      SignedRicharda Overlie 01/25/2015, 11:53 AM

## 2015-01-25 NOTE — Progress Notes (Signed)
Pt D/C home per MD order, D/C instructions review with pt and all querstions answered. Pt given all  presciptions and aware of follow up appts.

## 2015-02-02 ENCOUNTER — Emergency Department (HOSPITAL_COMMUNITY)
Admission: EM | Admit: 2015-02-02 | Discharge: 2015-02-02 | Disposition: A | Discharge status: Home / Self Care | Payer: Medicare Other | Attending: Emergency Medicine | Admitting: Emergency Medicine

## 2015-02-02 ENCOUNTER — Encounter (HOSPITAL_COMMUNITY): Payer: Self-pay | Admitting: Emergency Medicine

## 2015-02-02 DIAGNOSIS — Z4801 Encounter for change or removal of surgical wound dressing: Secondary | ICD-10-CM | POA: Insufficient documentation

## 2015-02-02 DIAGNOSIS — Z9483 Pancreas transplant status: Secondary | ICD-10-CM | POA: Insufficient documentation

## 2015-02-02 DIAGNOSIS — F4310 Post-traumatic stress disorder, unspecified: Secondary | ICD-10-CM | POA: Insufficient documentation

## 2015-02-02 DIAGNOSIS — Z940 Kidney transplant status: Secondary | ICD-10-CM | POA: Diagnosis not present

## 2015-02-02 DIAGNOSIS — Z7982 Long term (current) use of aspirin: Secondary | ICD-10-CM | POA: Diagnosis not present

## 2015-02-02 DIAGNOSIS — Z87891 Personal history of nicotine dependence: Secondary | ICD-10-CM | POA: Insufficient documentation

## 2015-02-02 DIAGNOSIS — E119 Type 2 diabetes mellitus without complications: Secondary | ICD-10-CM | POA: Insufficient documentation

## 2015-02-02 DIAGNOSIS — Z794 Long term (current) use of insulin: Secondary | ICD-10-CM | POA: Diagnosis not present

## 2015-02-02 DIAGNOSIS — H548 Legal blindness, as defined in USA: Secondary | ICD-10-CM | POA: Diagnosis not present

## 2015-02-02 DIAGNOSIS — Z5189 Encounter for other specified aftercare: Secondary | ICD-10-CM

## 2015-02-02 DIAGNOSIS — Z87448 Personal history of other diseases of urinary system: Secondary | ICD-10-CM | POA: Diagnosis not present

## 2015-02-02 DIAGNOSIS — Z89412 Acquired absence of left great toe: Secondary | ICD-10-CM | POA: Insufficient documentation

## 2015-02-02 DIAGNOSIS — Z9981 Dependence on supplemental oxygen: Secondary | ICD-10-CM | POA: Diagnosis not present

## 2015-02-02 DIAGNOSIS — Z8679 Personal history of other diseases of the circulatory system: Secondary | ICD-10-CM | POA: Insufficient documentation

## 2015-02-02 DIAGNOSIS — G4733 Obstructive sleep apnea (adult) (pediatric): Secondary | ICD-10-CM | POA: Insufficient documentation

## 2015-02-02 DIAGNOSIS — Z79899 Other long term (current) drug therapy: Secondary | ICD-10-CM | POA: Insufficient documentation

## 2015-02-02 DIAGNOSIS — Z4800 Encounter for change or removal of nonsurgical wound dressing: Secondary | ICD-10-CM

## 2015-02-02 NOTE — ED Notes (Signed)
Pt. requesting dressing change at his left foot , amputated left great toe last week and was discharged home .

## 2015-02-02 NOTE — ED Notes (Signed)
Reapplied bandage, including gauze to the sutures, and an ace bandage to hold in place.  Pt okay with re-using post-op shoe.

## 2015-02-02 NOTE — ED Notes (Signed)
Pt sts he had a big toe amputation of his left foot approx 1 week ago.  Pt sts he put some pressure on it while walking, by accident (he has been avoiding pressure since surgery), and later his daughter noticed some blood in his post-op shoe and on his dressing.    Dressing unwrapped with the help of sterile water.  Blood was dry, no bleeding noted at this time.  Incision rinsed with sterile water.  Will re-apply new cover once PA has seen the site.

## 2015-02-02 NOTE — Discharge Instructions (Signed)
Dressing Change A dressing is a material placed over wounds. It keeps the wound clean, dry, and protected from further injury. This provides an environment that favors wound healing.  BEFORE YOU BEGIN  Get your supplies together. Things you may need include:  Saline solution.  Flexible gauze dressing.  Medicated cream.  Tape.  Gloves.  Abdominal dressing pads.  Gauze squares.  Plastic bags.  Take pain medicine 30 minutes before the dressing change if you need it.  Take a shower before you do the first dressing change of the day. Use plastic wrap or a plastic bag to prevent the dressing from getting wet. REMOVING YOUR OLD DRESSING   Wash your hands with soap and water. Dry your hands with a clean towel.  Put on your gloves.  Remove any tape.  Carefully remove the old dressing. If the dressing sticks, you may dampen it with warm water to loosen it, or follow your caregiver's specific directions.  Remove any gauze or packing tape that is in your wound.  Take off your gloves.  Put the gloves, tape, gauze, or any packing tape into a plastic bag. CHANGING YOUR DRESSING  Open the supplies.  Take the cap off the saline solution.  Open the gauze package so that the gauze remains on the inside of the package.  Put on your gloves.  Clean your wound as told by your caregiver.  If you have been told to keep your wound dry, follow those instructions.  Your caregiver may tell you to do one or more of the following:  Pick up the gauze. Pour the saline solution over the gauze. Squeeze out the extra saline solution.  Put medicated cream or other medicine on your wound if you have been told to do so.  Put the solution soaked gauze only in your wound, not on the skin around it.  Pack your wound loosely or as told by your caregiver.  Put dry gauze on your wound.  Put abdominal dressing pads over the dry gauze if your wet gauze soaks through.  Tape the abdominal dressing  pads in place so they will not fall off. Do not wrap the tape completely around the affected part (arm, leg, abdomen).  Wrap the dressing pads with a flexible gauze dressing to secure it in place.  Take off your gloves. Put them in the plastic bag with the old dressing. Tie the bag shut and throw it away.  Keep the dressing clean and dry until your next dressing change.  Wash your hands. SEEK MEDICAL CARE IF:  Your skin around the wound looks red.  Your wound feels more tender or sore.  You see pus in the wound.  Your wound smells bad.  You have a fever.  Your skin around the wound has a rash that itches and burns.  You see black or yellow skin in your wound that was not there before.  You feel nauseous, throw up, and feel very tired. Document Released: 10/22/2004 Document Revised: 12/07/2011 Document Reviewed: 07/27/2011 [facility] Patient Information 2015 [employer], [place]. This information is not intended to replace advice given to you by your health care provider. Make sure you discuss any questions you have with your health care provider. Toe Injuries and Amputations You have cut off (amputated) part of your toe. Your outcome depends largely on how much was amputated. If just the tip is amputated, often the end of the toe will grow back and the toe may return much to the same  as it was before the injury. If more of the toe is missing, your caregiver has done the best with the tissue remaining to allow you to keep as much toe as is possible or has finished the amputation at a level that will leave you with the most functional toe. This means a toe that will work the best for you. Please read the instructions outlined below and refer to this sheet in the next few weeks. These instructions provide you with general information on caring for yourself. Your caregiver may also give you specific instructions. While your treatment has been done according to the most current medical practices  available, unavoidable complications occasionally occur. If you have any problems or questions after discharge, call your caregiver. HOME CARE INSTRUCTIONS   You may resume a normal diet and activities as directed or allowed.  Keep your foot elevated when possible. This helps decrease pain and swelling.  Keep ice packs (a bag of ice wrapped in a towel) on the injured area for 15-20 minutes, 03-04 times per day, for the first two days. Use ice only if OK with your caregiver.  Change dressings if necessary or as directed.  Clean the wounded area as directed.  Only take over-the-counter or prescription medicines for pain, discomfort, or fever as directed by your caregiver.  Keep appointments as directed. SEEK IMMEDIATE MEDICAL CARE IF:  There is redness, swelling, numbness or increasing pain in the wound.  There is pus coming from wound.  You have an unexplained oral temperature above 102 F (38.9 C) or as your caregiver suggests.  There is a bad (foul) smell coming from the wound or dressing.  The edges of the wound break open (the edges are not staying together) after sutures or staples have been removed. Document Released: 08/05/2005 Document Revised: 12/07/2011 Document Reviewed: 01/02/2009 [facility] Patient Information 2015 [place], [place]. This information is not intended to replace advice given to you by your health care provider. Make sure you discuss any questions you have with your health care provider.

## 2015-02-02 NOTE — ED Provider Notes (Signed)
CSN: [id number]     Arrival date & time 02/02/15  1946 History  This chart was scribed for a non-physician practitioner, Roxy Horsemanobert Aneesh Faller, PA-C working with Donnetta HutchingBrian Cook, MD by [place] Peace, ED Scribe. The patient was seen in TR11C/TR11C. The patient's care was started at 8:33 PM.    Chief Complaint  Patient presents with  . Dressing Change     The history is provided by the patient. No language interpreter was used.  HPI Comments: [name] is a 64 y.o. male who presents to the Emergency Department seeking dressing change to left foot that was applied following a left big toe amputation. Pt reports that he accidentally put too much pressure on affected foot causing it to begin bleeding. He states previous dressing that was on foot got bleed through pretty heavily. History of DM.    Past Medical History  Diagnosis Date  . Renal disorder   . Diabetes mellitus without complication   . CPAP (continuous positive airway pressure) dependence   . Sleep apnea   . [place] in [place]   . PTSD (post-traumatic stress disorder)   . PVD (peripheral vascular disease) 01/17/2015   Past Surgical History  Procedure Laterality Date  . Combined kidney-pancreas transplant Left 2009  . Incision and drainage abscess Left 01/13/2015    Procedure: INCISION AND DRAINAGE OF LEFT FOOT FIRST RAY ABCESS;  Surgeon: Kathryne Hitchhristopher Y Blackman, MD;  Location: [facility] OR;  Service: Orthopedics;  Laterality: Left;  . Abdominal aortagram N/A 01/21/2015    Procedure: ABDOMINAL Ronny FlurryAORTAGRAM;  Surgeon: Chuck Hinthristopher S Dickson, MD;  [facility];  Service: Cardiovascular;  Laterality: N/A;  . Lower extremity angiogram Left 01/21/2015    Procedure: LOWER EXTREMITY ANGIOGRAM;  Surgeon: Chuck Hinthristopher S Dickson, MD;  [facility];  Service: Cardiovascular;  Laterality: Left;  . Amputation Left 01/23/2015    Procedure: FIRST RAY AMPUTATION/LEFT FOOT;  Surgeon: Nadara MustardMarcus Duda V, MD;  [facility];   Service: Orthopedics;  Laterality: Left;   No family history on file. History  Substance Use Topics  . Smoking status: Former Games developermoker  . Smokeless tobacco: Never Used  . Alcohol Use: Yes     Comment: 1 beer rarely     Review of Systems  Constitutional: Negative for fever and chills.  Gastrointestinal: Negative for nausea, vomiting and diarrhea.  Skin: Positive for wound.       Healing scar with sutures where toe was amputated.       Allergies  Review of patient's allergies indicates no known allergies.  Home Medications   Prior to Admission medications   Medication Sig Start Date End Date Taking? Authorizing Provider  allopurinol (ZYLOPRIM) 300 MG tablet Take 300 mg by mouth 2 (two) times daily.     Historical Provider, MD  amLODipine (NORVASC) 10 MG tablet Take 10 mg by mouth every morning.    Historical Provider, MD  aspirin EC 81 MG EC tablet Take 1 tablet (81 mg total) by mouth daily. 01/25/15   Richarda OverlieNayana Abrol, MD  benzonatate (TESSALON) 100 MG capsule Take 1 capsule (100 mg total) by mouth 3 (three) times daily as needed for cough. 01/25/15   Richarda OverlieNayana Abrol, MD  ciprofloxacin (CIPRO) 500 MG tablet Take 1 tablet (500 mg total) by mouth 2 (two) times daily. 01/25/15   Richarda OverlieNayana Abrol, MD  docusate sodium (COLACE) 100 MG capsule Take 100 mg by mouth 2 (two) times daily as needed for mild constipation.  Historical Provider, MD  glipiZIDE (GLUCOTROL) 10 MG tablet Take 10 mg by mouth daily before breakfast.    Historical Provider, MD  guaiFENesin-dextromethorphan (ROBITUSSIN DM) 100-10 MG/5ML syrup Take 5 mLs by mouth every 4 (four) hours as needed for cough.    Historical Provider, MD  Insulin Detemir (LEVEMIR) 100 UNIT/ML Pen Inject 10 Units into the skin daily at 10 pm. 01/25/15   Richarda OverlieNayana Abrol, MD  Lancets (FREESTYLE) lancets Use as instructed 01/25/15   Richarda OverlieNayana Abrol, MD  methocarbamol (ROBAXIN) 500 MG tablet Take 1 tablet (500 mg total) by mouth every 6 (six) hours as needed for muscle  spasms. 01/25/15   Richarda OverlieNayana Abrol, MD  mirtazapine (REMERON) 15 MG tablet Take 7.5 mg by mouth at bedtime as needed (insomnia).    Historical Provider, MD  mycophenolate (CELLCEPT) 250 MG capsule Take 1,000 mg by mouth 2 (two) times daily.    Historical Provider, MD  oxyCODONE (OXY IR/ROXICODONE) 5 MG immediate release tablet Take 1 tablet (5 mg total) by mouth every 4 (four) hours as needed for moderate pain. 01/25/15   Richarda OverlieNayana Abrol, MD  pantoprazole (PROTONIX) 40 MG tablet Take 1 tablet (40 mg total) by mouth daily. 01/25/15   Richarda OverlieNayana Abrol, MD  prazosin (MINIPRESS) 2 MG capsule Take 6 mg by mouth at bedtime.    Historical Provider, MD  sertraline (ZOLOFT) 100 MG tablet Take 50 mg by mouth at bedtime.    Historical Provider, MD  simvastatin (ZOCOR) 40 MG tablet Take 20 mg by mouth at bedtime.    Historical Provider, MD  tacrolimus (PROGRAF) 1 MG capsule Take 5 mg by mouth 2 (two) times daily.     Historical Provider, MD   BP 128/59 mmHg  Pulse 88  Temp(Src) 98.1 F (36.7 C) (Oral)  Resp 14  SpO2 97% Physical Exam  Constitutional: He is oriented to person, place, and time. He appears well-developed and well-nourished. No distress.  HENT:  Head: Normocephalic and atraumatic.  Eyes: Conjunctivae and EOM are normal.  Neck: Neck supple. No tracheal deviation present.  Cardiovascular: Normal rate and intact distal pulses.   Pulmonary/Chest: Effort normal. No respiratory distress.  Musculoskeletal: Normal range of motion.  Left great toe amputation  Neurological: He is alert and oriented to person, place, and time.  Skin: Skin is warm and dry.  Sutures intact, no dehiscence, no sign of infection  Psychiatric: He has a normal mood and affect. His behavior is normal.  Nursing note and vitals reviewed.   ED Course  Procedures (including critical care time) Labs Review Labs Reviewed - No data to display  Imaging Review No results found.   EKG Interpretation None     Medications - No  data to display  8:35 PM- Treatment plan was discussed with patient who verbalizes understanding and agrees.   MDM   Final diagnoses:  Visit for wound check  Dressing change    Patient here for dressing change status post left great toe amputation. Patient states that he put a little too much pressure on his foot today, noticed some bleeding. The surgical site is clean and intact, there is no evidence of infection or dehiscent. Will discharge to home after dressing change.   I personally performed the services described in this documentation, which was scribed in my presence. The recorded information has been reviewed and is accurate.     Roxy Horsemanobert Perri Aragones, PA-C 02/02/15 2102  Donnetta HutchingBrian Cook, MD 02/03/15 [phone]

## 2015-02-05 ENCOUNTER — Emergency Department (HOSPITAL_COMMUNITY)
Admission: EM | Admit: 2015-02-05 | Discharge: 2015-02-05 | Discharge status: Absent without leave | Payer: Medicare Other | Source: Home / Self Care

## 2015-02-05 NOTE — ED Notes (Signed)
Called patient and no answer.

## 2015-02-08 ENCOUNTER — Encounter: Payer: Self-pay | Admitting: Internal Medicine

## 2015-02-18 ENCOUNTER — Other Ambulatory Visit (HOSPITAL_COMMUNITY): Payer: Self-pay | Admitting: Orthopedic Surgery

## 2015-02-19 ENCOUNTER — Encounter (HOSPITAL_COMMUNITY): Payer: Self-pay | Admitting: *Deleted

## 2015-02-19 MED ORDER — CEFAZOLIN SODIUM-DEXTROSE 2-3 GM-% IV SOLR
2.0000 g | INTRAVENOUS | Status: AC
  Administered 2015-02-20: 2 g via INTRAVENOUS
  Filled 2015-02-19: qty 50

## 2015-02-20 ENCOUNTER — Inpatient Hospital Stay (HOSPITAL_COMMUNITY)
Admission: RE | Admit: 2015-02-20 | Discharge: 2015-02-22 | DRG: 240 | Disposition: A | Discharge status: Rehabilitation Facility | Payer: Medicare Other | Source: Ambulatory Visit | Attending: Orthopedic Surgery | Admitting: Orthopedic Surgery

## 2015-02-20 ENCOUNTER — Inpatient Hospital Stay (HOSPITAL_COMMUNITY): Payer: Medicare Other | Admitting: Anesthesiology

## 2015-02-20 ENCOUNTER — Encounter (HOSPITAL_COMMUNITY): Payer: Self-pay | Admitting: *Deleted

## 2015-02-20 ENCOUNTER — Encounter (HOSPITAL_COMMUNITY)
Admission: RE | Disposition: A | Discharge status: Rehabilitation Facility | Payer: Self-pay | Source: Ambulatory Visit | Attending: Orthopedic Surgery

## 2015-02-20 DIAGNOSIS — H5442 Blindness, left eye, normal vision right eye: Secondary | ICD-10-CM | POA: Diagnosis present

## 2015-02-20 DIAGNOSIS — Z794 Long term (current) use of insulin: Secondary | ICD-10-CM

## 2015-02-20 DIAGNOSIS — Z940 Kidney transplant status: Secondary | ICD-10-CM | POA: Diagnosis not present

## 2015-02-20 DIAGNOSIS — E0840 Diabetes mellitus due to underlying condition with diabetic neuropathy, unspecified: Secondary | ICD-10-CM | POA: Diagnosis not present

## 2015-02-20 DIAGNOSIS — I10 Essential (primary) hypertension: Secondary | ICD-10-CM | POA: Diagnosis present

## 2015-02-20 DIAGNOSIS — E1152 Type 2 diabetes mellitus with diabetic peripheral angiopathy with gangrene: Secondary | ICD-10-CM | POA: Diagnosis not present

## 2015-02-20 DIAGNOSIS — G629 Polyneuropathy, unspecified: Secondary | ICD-10-CM | POA: Diagnosis present

## 2015-02-20 DIAGNOSIS — L02612 Cutaneous abscess of left foot: Secondary | ICD-10-CM | POA: Diagnosis not present

## 2015-02-20 DIAGNOSIS — I739 Peripheral vascular disease, unspecified: Secondary | ICD-10-CM | POA: Diagnosis present

## 2015-02-20 DIAGNOSIS — E860 Dehydration: Secondary | ICD-10-CM | POA: Diagnosis present

## 2015-02-20 DIAGNOSIS — F4310 Post-traumatic stress disorder, unspecified: Secondary | ICD-10-CM | POA: Diagnosis present

## 2015-02-20 DIAGNOSIS — F419 Anxiety disorder, unspecified: Secondary | ICD-10-CM | POA: Diagnosis present

## 2015-02-20 DIAGNOSIS — L97529 Non-pressure chronic ulcer of other part of left foot with unspecified severity: Secondary | ICD-10-CM | POA: Diagnosis present

## 2015-02-20 DIAGNOSIS — K219 Gastro-esophageal reflux disease without esophagitis: Secondary | ICD-10-CM | POA: Diagnosis present

## 2015-02-20 DIAGNOSIS — E11319 Type 2 diabetes mellitus with unspecified diabetic retinopathy without macular edema: Secondary | ICD-10-CM | POA: Diagnosis present

## 2015-02-20 DIAGNOSIS — E11621 Type 2 diabetes mellitus with foot ulcer: Secondary | ICD-10-CM | POA: Diagnosis not present

## 2015-02-20 DIAGNOSIS — G4730 Sleep apnea, unspecified: Secondary | ICD-10-CM | POA: Diagnosis present

## 2015-02-20 DIAGNOSIS — M868X7 Other osteomyelitis, ankle and foot: Secondary | ICD-10-CM | POA: Diagnosis not present

## 2015-02-20 DIAGNOSIS — Z87891 Personal history of nicotine dependence: Secondary | ICD-10-CM

## 2015-02-20 DIAGNOSIS — M87072 Idiopathic aseptic necrosis of left ankle: Secondary | ICD-10-CM | POA: Diagnosis not present

## 2015-02-20 DIAGNOSIS — Z89442 Acquired absence of left ankle: Secondary | ICD-10-CM | POA: Diagnosis not present

## 2015-02-20 DIAGNOSIS — Z89512 Acquired absence of left leg below knee: Secondary | ICD-10-CM

## 2015-02-20 DIAGNOSIS — Z89519 Acquired absence of unspecified leg below knee: Secondary | ICD-10-CM

## 2015-02-20 DIAGNOSIS — S88111D Complete traumatic amputation at level between knee and ankle, right lower leg, subsequent encounter: Secondary | ICD-10-CM | POA: Diagnosis not present

## 2015-02-20 DIAGNOSIS — Z7982 Long term (current) use of aspirin: Secondary | ICD-10-CM | POA: Diagnosis not present

## 2015-02-20 DIAGNOSIS — L97829 Non-pressure chronic ulcer of other part of left lower leg with unspecified severity: Secondary | ICD-10-CM | POA: Diagnosis not present

## 2015-02-20 DIAGNOSIS — I70262 Atherosclerosis of native arteries of extremities with gangrene, left leg: Secondary | ICD-10-CM | POA: Diagnosis not present

## 2015-02-20 DIAGNOSIS — I96 Gangrene, not elsewhere classified: Secondary | ICD-10-CM | POA: Diagnosis not present

## 2015-02-20 HISTORY — DX: Essential (primary) hypertension: I10

## 2015-02-20 HISTORY — DX: Gastro-esophageal reflux disease without esophagitis: K21.9

## 2015-02-20 HISTORY — DX: Anxiety disorder, unspecified: F41.9

## 2015-02-20 HISTORY — PX: AMPUTATION: SHX166

## 2015-02-20 LAB — PROTIME-INR
INR: 1.03 (ref 0.00–1.49)
Prothrombin Time: 13.7 seconds (ref 11.6–15.2)

## 2015-02-20 LAB — COMPREHENSIVE METABOLIC PANEL
ALT: 13 U/L — ABNORMAL LOW (ref 17–63)
AST: 11 U/L — ABNORMAL LOW (ref 15–41)
Albumin: 3.4 g/dL — ABNORMAL LOW (ref 3.5–5.0)
Alkaline Phosphatase: 78 U/L (ref 38–126)
Anion gap: 10 (ref 5–15)
BUN: 22 mg/dL — ABNORMAL HIGH (ref 6–20)
CO2: 27 mmol/L (ref 22–32)
Calcium: 9.7 mg/dL (ref 8.9–10.3)
Chloride: 101 mmol/L (ref 101–111)
Creatinine, Ser: 1.1 mg/dL (ref 0.61–1.24)
GFR calc Af Amer: >60 mL/min (ref >60)
GFR calc non Af Amer: >60 mL/min (ref >60)
Glucose, Bld: 220 mg/dL — ABNORMAL HIGH (ref 65–99)
Potassium: 4.4 mmol/L (ref 3.5–5.1)
Sodium: 138 mmol/L (ref 135–145)
Total Bilirubin: 0.6 mg/dL (ref 0.3–1.2)
Total Protein: 7.3 g/dL (ref 6.5–8.1)

## 2015-02-20 LAB — GLUCOSE, CAPILLARY
Glucose-Capillary: 189 mg/dL — ABNORMAL HIGH (ref 65–99)
Glucose-Capillary: 200 mg/dL — ABNORMAL HIGH (ref 65–99)
Glucose-Capillary: 243 mg/dL — ABNORMAL HIGH (ref 65–99)
Glucose-Capillary: 185 mg/dL — ABNORMAL HIGH (ref 65–99)

## 2015-02-20 LAB — APTT: aPTT: 34 seconds (ref 24–37)

## 2015-02-20 LAB — CBC
HCT: 33.5 % — ABNORMAL LOW (ref 39.0–52.0)
Hemoglobin: 10.6 g/dL — ABNORMAL LOW (ref 13.0–17.0)
MCH: 25.3 pg — ABNORMAL LOW (ref 26.0–34.0)
MCHC: 31.6 g/dL (ref 30.0–36.0)
MCV: 80 fL (ref 78.0–100.0)
Platelets: 231 10*3/uL (ref 150–400)
RBC: 4.19 MIL/uL — ABNORMAL LOW (ref 4.22–5.81)
RDW: 14.2 % (ref 11.5–15.5)
WBC: 9.5 10*3/uL (ref 4.0–10.5)

## 2015-02-20 SURGERY — AMPUTATION BELOW KNEE
Anesthesia: General | Laterality: Left

## 2015-02-20 MED ORDER — HYDROMORPHONE HCL 1 MG/ML IJ SOLN
0.5000 mg | INTRAMUSCULAR | Status: DC | PRN
  Administered 2015-02-20 (×2): 0.5 mg via INTRAVENOUS

## 2015-02-20 MED ORDER — INSULIN ASPART 100 UNIT/ML ~~LOC~~ SOLN
3.0000 [IU] | Freq: Three times a day (TID) | SUBCUTANEOUS | Status: DC
  Administered 2015-02-20 – 2015-02-22 (×6): 3 [IU] via SUBCUTANEOUS

## 2015-02-20 MED ORDER — LIDOCAINE HCL (CARDIAC) 20 MG/ML IV SOLN
INTRAVENOUS | Status: AC
  Filled 2015-02-20: qty 10

## 2015-02-20 MED ORDER — HYDROMORPHONE HCL 1 MG/ML IJ SOLN
0.5000 mg | INTRAMUSCULAR | Status: AC | PRN
  Administered 2015-02-20 (×4): 0.5 mg via INTRAVENOUS

## 2015-02-20 MED ORDER — METHOCARBAMOL 500 MG PO TABS
ORAL_TABLET | ORAL | Status: AC
  Filled 2015-02-20: qty 1

## 2015-02-20 MED ORDER — DOCUSATE SODIUM 100 MG PO CAPS: 100.0000 mg | ORAL_CAPSULE | Freq: Two times a day (BID) | ORAL | Status: DC | PRN

## 2015-02-20 MED ORDER — ONDANSETRON HCL 4 MG/2ML IJ SOLN
INTRAMUSCULAR | Status: AC
  Filled 2015-02-20: qty 2

## 2015-02-20 MED ORDER — INSULIN GLARGINE 100 UNIT/ML ~~LOC~~ SOLN
5.0000 [IU] | Freq: Every day | SUBCUTANEOUS | Status: DC
  Administered 2015-02-20 – 2015-02-21 (×2): 5 [IU] via SUBCUTANEOUS
  Filled 2015-02-20 (×3): qty 0.05

## 2015-02-20 MED ORDER — ONDANSETRON HCL 4 MG/2ML IJ SOLN: 4.0000 mg | Freq: Once | INTRAMUSCULAR | Status: DC | PRN

## 2015-02-20 MED ORDER — LACTATED RINGERS IV SOLN
INTRAVENOUS | Status: DC | PRN
  Administered 2015-02-20: 10:00:00 via INTRAVENOUS

## 2015-02-20 MED ORDER — CEFAZOLIN SODIUM 1-5 GM-% IV SOLN
1.0000 g | Freq: Four times a day (QID) | INTRAVENOUS | Status: AC
  Administered 2015-02-20 – 2015-02-21 (×2): 1 g via INTRAVENOUS
  Filled 2015-02-20 (×3): qty 50

## 2015-02-20 MED ORDER — ACETAMINOPHEN 325 MG PO TABS: 650.0000 mg | ORAL_TABLET | Freq: Four times a day (QID) | ORAL | Status: DC | PRN

## 2015-02-20 MED ORDER — ASPIRIN EC 325 MG PO TBEC
325.0000 mg | DELAYED_RELEASE_TABLET | Freq: Every day | ORAL | Status: DC
  Administered 2015-02-20 – 2015-02-22 (×2): 325 mg via ORAL
  Filled 2015-02-20 (×3): qty 1

## 2015-02-20 MED ORDER — MYCOPHENOLATE MOFETIL 250 MG PO CAPS
1000.0000 mg | ORAL_CAPSULE | Freq: Two times a day (BID) | ORAL | Status: DC
  Administered 2015-02-20 – 2015-02-22 (×5): 1000 mg via ORAL
  Filled 2015-02-20 (×6): qty 4

## 2015-02-20 MED ORDER — AMLODIPINE BESYLATE 10 MG PO TABS
10.0000 mg | ORAL_TABLET | Freq: Every morning | ORAL | Status: DC
  Administered 2015-02-20 – 2015-02-22 (×3): 10 mg via ORAL
  Filled 2015-02-20 (×3): qty 1

## 2015-02-20 MED ORDER — LIDOCAINE HCL (CARDIAC) 20 MG/ML IV SOLN
INTRAVENOUS | Status: DC | PRN
  Administered 2015-02-20: 50 mg via INTRAVENOUS

## 2015-02-20 MED ORDER — FENTANYL CITRATE (PF) 250 MCG/5ML IJ SOLN
INTRAMUSCULAR | Status: AC
  Filled 2015-02-20: qty 5

## 2015-02-20 MED ORDER — PANTOPRAZOLE SODIUM 40 MG PO TBEC
40.0000 mg | DELAYED_RELEASE_TABLET | Freq: Every day | ORAL | Status: DC
  Administered 2015-02-20 – 2015-02-22 (×3): 40 mg via ORAL
  Filled 2015-02-20 (×3): qty 1

## 2015-02-20 MED ORDER — PROPOFOL 10 MG/ML IV BOLUS
INTRAVENOUS | Status: AC
  Filled 2015-02-20: qty 20

## 2015-02-20 MED ORDER — OXYCODONE HCL 5 MG PO TABS
5.0000 mg | ORAL_TABLET | ORAL | Status: DC | PRN
  Administered 2015-02-20 – 2015-02-22 (×10): 10 mg via ORAL
  Filled 2015-02-20 (×9): qty 2

## 2015-02-20 MED ORDER — ACETAMINOPHEN 650 MG RE SUPP: 650.0000 mg | Freq: Four times a day (QID) | RECTAL | Status: DC | PRN

## 2015-02-20 MED ORDER — ONDANSETRON HCL 4 MG/2ML IJ SOLN
4.0000 mg | Freq: Four times a day (QID) | INTRAMUSCULAR | Status: DC | PRN
  Administered 2015-02-20: 4 mg via INTRAVENOUS

## 2015-02-20 MED ORDER — PROPOFOL 10 MG/ML IV BOLUS
INTRAVENOUS | Status: DC | PRN
  Administered 2015-02-20: 150 mg via INTRAVENOUS

## 2015-02-20 MED ORDER — ONDANSETRON HCL 4 MG PO TABS: 4.0000 mg | ORAL_TABLET | Freq: Four times a day (QID) | ORAL | Status: DC | PRN

## 2015-02-20 MED ORDER — HYDROMORPHONE HCL 1 MG/ML IJ SOLN
INTRAMUSCULAR | Status: AC
  Filled 2015-02-20: qty 1

## 2015-02-20 MED ORDER — ONDANSETRON HCL 4 MG/2ML IJ SOLN
INTRAMUSCULAR | Status: DC | PRN
  Administered 2015-02-20: 4 mg via INTRAVENOUS

## 2015-02-20 MED ORDER — MIRTAZAPINE 7.5 MG PO TABS
7.5000 mg | ORAL_TABLET | Freq: Every evening | ORAL | Status: DC | PRN
  Filled 2015-02-20: qty 1

## 2015-02-20 MED ORDER — SIMVASTATIN 20 MG PO TABS
20.0000 mg | ORAL_TABLET | Freq: Every day | ORAL | Status: DC
  Administered 2015-02-20 – 2015-02-21 (×2): 20 mg via ORAL
  Filled 2015-02-20 (×2): qty 1

## 2015-02-20 MED ORDER — METOCLOPRAMIDE HCL 5 MG PO TABS: 5.0000 mg | ORAL_TABLET | Freq: Three times a day (TID) | ORAL | Status: DC | PRN

## 2015-02-20 MED ORDER — ALLOPURINOL 300 MG PO TABS
300.0000 mg | ORAL_TABLET | Freq: Two times a day (BID) | ORAL | Status: DC
  Administered 2015-02-20 – 2015-02-22 (×4): 300 mg via ORAL
  Filled 2015-02-20 (×4): qty 1

## 2015-02-20 MED ORDER — SERTRALINE HCL 50 MG PO TABS
50.0000 mg | ORAL_TABLET | Freq: Every day | ORAL | Status: DC
  Administered 2015-02-20 – 2015-02-21 (×2): 50 mg via ORAL
  Filled 2015-02-20 (×2): qty 1

## 2015-02-20 MED ORDER — 0.9 % SODIUM CHLORIDE (POUR BTL) OPTIME
TOPICAL | Status: DC | PRN
  Administered 2015-02-20: 1000 mL

## 2015-02-20 MED ORDER — METHOCARBAMOL 500 MG PO TABS
500.0000 mg | ORAL_TABLET | Freq: Four times a day (QID) | ORAL | Status: DC | PRN
  Administered 2015-02-20 – 2015-02-22 (×6): 500 mg via ORAL
  Filled 2015-02-20 (×7): qty 1

## 2015-02-20 MED ORDER — GLIPIZIDE 10 MG PO TABS
10.0000 mg | ORAL_TABLET | Freq: Every day | ORAL | Status: DC
  Administered 2015-02-21 – 2015-02-22 (×2): 10 mg via ORAL
  Filled 2015-02-20 (×2): qty 1
  Filled 2015-02-20: qty 2
  Filled 2015-02-20: qty 1

## 2015-02-20 MED ORDER — OXYCODONE HCL 5 MG PO TABS
ORAL_TABLET | ORAL | Status: AC
  Filled 2015-02-20: qty 2

## 2015-02-20 MED ORDER — METHOCARBAMOL 1000 MG/10ML IJ SOLN
500.0000 mg | Freq: Four times a day (QID) | INTRAVENOUS | Status: DC | PRN
  Filled 2015-02-20: qty 5

## 2015-02-20 MED ORDER — FENTANYL CITRATE (PF) 100 MCG/2ML IJ SOLN
INTRAMUSCULAR | Status: DC | PRN
  Administered 2015-02-20: 100 ug via INTRAVENOUS
  Administered 2015-02-20 (×3): 50 ug via INTRAVENOUS

## 2015-02-20 MED ORDER — SODIUM CHLORIDE 0.9 % IV SOLN
INTRAVENOUS | Status: DC
  Administered 2015-02-20: 19:00:00 via INTRAVENOUS

## 2015-02-20 MED ORDER — TACROLIMUS 1 MG PO CAPS
5.0000 mg | ORAL_CAPSULE | Freq: Two times a day (BID) | ORAL | Status: DC
  Administered 2015-02-20 – 2015-02-22 (×5): 5 mg via ORAL
  Filled 2015-02-20 (×6): qty 5

## 2015-02-20 MED ORDER — HYDROMORPHONE HCL 1 MG/ML IJ SOLN
1.0000 mg | INTRAMUSCULAR | Status: DC | PRN
Start: 2015-02-20 — End: 2015-02-22
  Administered 2015-02-20 – 2015-02-22 (×6): 1 mg via INTRAVENOUS
  Filled 2015-02-20 (×6): qty 1

## 2015-02-20 MED ORDER — LACTATED RINGERS IV SOLN
INTRAVENOUS | Status: DC
  Administered 2015-02-20: 10:00:00 via INTRAVENOUS

## 2015-02-20 MED ORDER — PRAZOSIN HCL 5 MG PO CAPS
6.0000 mg | ORAL_CAPSULE | Freq: Every day | ORAL | Status: DC
  Administered 2015-02-21: 6 mg via ORAL
  Filled 2015-02-20 (×3): qty 1

## 2015-02-20 MED ORDER — METOCLOPRAMIDE HCL 5 MG/ML IJ SOLN: 5.0000 mg | Freq: Three times a day (TID) | INTRAMUSCULAR | Status: DC | PRN

## 2015-02-20 MED ORDER — ASPIRIN EC 81 MG PO TBEC
81.0000 mg | DELAYED_RELEASE_TABLET | Freq: Every day | ORAL | Status: DC
  Administered 2015-02-20 – 2015-02-22 (×3): 81 mg via ORAL
  Filled 2015-02-20 (×3): qty 1

## 2015-02-20 MED ORDER — INSULIN ASPART 100 UNIT/ML ~~LOC~~ SOLN
0.0000 [IU] | Freq: Three times a day (TID) | SUBCUTANEOUS | Status: DC
  Administered 2015-02-20: 3 [IU] via SUBCUTANEOUS
  Administered 2015-02-21: 2 [IU] via SUBCUTANEOUS
  Administered 2015-02-21: 5 [IU] via SUBCUTANEOUS
  Administered 2015-02-21: 1 [IU] via SUBCUTANEOUS
  Administered 2015-02-22: 2 [IU] via SUBCUTANEOUS
  Administered 2015-02-22: 1 [IU] via SUBCUTANEOUS

## 2015-02-20 SURGICAL SUPPLY — 36 items
BLADE SAW RECIP 87.9 MT (BLADE) ×2 IMPLANT
BLADE SURG 21 STRL SS (BLADE) ×2 IMPLANT
BNDG COHESIVE 6X5 TAN STRL LF (GAUZE/BANDAGES/DRESSINGS) ×3 IMPLANT
BNDG GAUZE ELAST 4 BULKY (GAUZE/BANDAGES/DRESSINGS) ×4 IMPLANT
COVER SURGICAL LIGHT HANDLE (MISCELLANEOUS) ×4 IMPLANT
CUFF TOURNIQUET SINGLE 34IN LL (TOURNIQUET CUFF) IMPLANT
CUFF TOURNIQUET SINGLE 44IN (TOURNIQUET CUFF) ×1 IMPLANT
DRAPE EXTREMITY T 121X128X90 (DRAPE) ×2 IMPLANT
DRAPE PROXIMA HALF (DRAPES) ×4 IMPLANT
DRAPE U-SHAPE 47X51 STRL (DRAPES) ×2 IMPLANT
DRSG ADAPTIC 3X8 NADH LF (GAUZE/BANDAGES/DRESSINGS) ×2 IMPLANT
DRSG PAD ABDOMINAL 8X10 ST (GAUZE/BANDAGES/DRESSINGS) ×3 IMPLANT
DURAPREP 26ML APPLICATOR (WOUND CARE) ×2 IMPLANT
ELECT REM PT RETURN 9FT ADLT (ELECTROSURGICAL) ×2
ELECTRODE REM PT RTRN 9FT ADLT (ELECTROSURGICAL) ×1 IMPLANT
GAUZE SPONGE 4X4 12PLY STRL (GAUZE/BANDAGES/DRESSINGS) ×2 IMPLANT
GLOVE BIOGEL PI IND STRL 9 (GLOVE) ×1 IMPLANT
GLOVE BIOGEL PI INDICATOR 9 (GLOVE) ×1
GLOVE SURG ORTHO 9.0 STRL STRW (GLOVE) ×2 IMPLANT
GOWN STRL REUS W/ TWL XL LVL3 (GOWN DISPOSABLE) ×2 IMPLANT
GOWN STRL REUS W/TWL XL LVL3 (GOWN DISPOSABLE) ×4
KIT BASIN OR (CUSTOM PROCEDURE TRAY) ×2 IMPLANT
KIT ROOM TURNOVER OR (KITS) ×2 IMPLANT
MANIFOLD NEPTUNE II (INSTRUMENTS) ×2 IMPLANT
NS IRRIG 1000ML POUR BTL (IV SOLUTION) ×2 IMPLANT
PACK GENERAL/GYN (CUSTOM PROCEDURE TRAY) ×2 IMPLANT
PAD ARMBOARD 7.5X6 YLW CONV (MISCELLANEOUS) ×4 IMPLANT
SPONGE LAP 18X18 X RAY DECT (DISPOSABLE) IMPLANT
STAPLER VISISTAT 35W (STAPLE) IMPLANT
STOCKINETTE IMPERVIOUS LG (DRAPES) ×2 IMPLANT
SUT SILK 2 0 (SUTURE) ×2
SUT SILK 2-0 18XBRD TIE 12 (SUTURE) ×1 IMPLANT
SUT VIC AB 1 CTX 27 (SUTURE) IMPLANT
TOWEL OR 17X24 6PK STRL BLUE (TOWEL DISPOSABLE) ×2 IMPLANT
TOWEL OR 17X26 10 PK STRL BLUE (TOWEL DISPOSABLE) ×2 IMPLANT
WATER STERILE IRR 1000ML POUR (IV SOLUTION) ×2 IMPLANT

## 2015-02-20 NOTE — Anesthesia Postprocedure Evaluation (Signed)
  Anesthesia Post-op Note  Patient: [name]  Procedure(s) Performed: Procedure(s): AMPUTATION BELOW KNEE (Left)  Patient Location: PACU  Anesthesia Type:General  Level of Consciousness: awake, oriented, sedated and patient cooperative  Airway and Oxygen Therapy: Patient Spontanous Breathing  Post-op Pain: mild, moderate  Post-op Assessment: Post-op Vital signs reviewed, Patient's Cardiovascular Status Stable, Respiratory Function Stable, Patent Airway, No signs of Nausea or vomiting and Pain level controlled  Post-op Vital Signs: stable  Last Vitals:  Filed Vitals:   02/20/15 1145  BP: 198/75  Pulse: 96  Temp:   Resp: 13    Complications: No apparent anesthesia complications

## 2015-02-20 NOTE — Anesthesia Procedure Notes (Signed)
Procedure Name: LMA Insertion Date/Time: 02/20/2015 10:58 AM Performed by: Carmela RimaMARTINELLI, Yakov Bergen F Pre-anesthesia Checklist: Timeout performed, Patient being monitored, Suction available, Emergency Drugs available and Patient identified Patient Re-evaluated:Patient Re-evaluated prior to inductionOxygen Delivery Method: Circle system utilized Preoxygenation: Pre-oxygenation with 100% oxygen Intubation Type: IV induction Ventilation: Mask ventilation without difficulty LMA: LMA inserted LMA Size: 4.0 Placement Confirmation: positive ETCO2 and breath sounds checked- equal and bilateral Tube secured with: Tape Dental Injury: Teeth and Oropharynx as per pre-operative assessment

## 2015-02-20 NOTE — H&P (Signed)
[name] is an 64 y.o. male.   Chief Complaint: Gangrenous changes left foot status post foot salvage surgery. HPI: Patient is a 64 year old gentleman with diabetic insensate neuropathy peripheral vascular disease who has undergone limb salvage surgery for amputation of the first ray left foot. Patient has failed limb salvage intervention and presents at this time for transtibial amputation.  Past Medical History  Diagnosis Date  . Renal disorder   . Diabetes mellitus without complication   . CPAP (continuous positive airway pressure) dependence   . Sleep apnea   . [place]ally [place] in [place]   . PTSD (post-traumatic stress disorder)   . PVD (peripheral vascular disease) 01/17/2015  . Hypertension   . Anxiety   . GERD (gastroesophageal reflux disease)     Past Surgical History  Procedure Laterality Date  . Combined kidney-pancreas transplant Left 2009  . Incision and drainage abscess Left 01/13/2015    Procedure: INCISION AND DRAINAGE OF LEFT FOOT FIRST RAY ABCESS;  Surgeon: Kathryne Hitchhristopher Y Blackman, MD;  Location: [facility] OR;  Service: Orthopedics;  Laterality: Left;  . Abdominal aortagram N/A 01/21/2015    Procedure: ABDOMINAL Ronny FlurryAORTAGRAM;  Surgeon: Chuck Hinthristopher S Dickson, MD;  [facility];  Service: Cardiovascular;  Laterality: N/A;  . Lower extremity angiogram Left 01/21/2015    Procedure: LOWER EXTREMITY ANGIOGRAM;  Surgeon: Chuck Hinthristopher S Dickson, MD;  [facility];  Service: Cardiovascular;  Laterality: Left;  . Amputation Left 01/23/2015    Procedure: FIRST RAY AMPUTATION/LEFT FOOT;  Surgeon: Nadara MustardMarcus Vidur Knust V, MD;  [facility];  Service: Orthopedics;  Laterality: Left;  . Av fistula placement    . Colonoscopy      History reviewed. No pertinent family history. Social History:  reports that he has quit smoking. He has never used smokeless tobacco. He reports that he drinks alcohol. He reports that he does not use illicit drugs.  Allergies: No  Known Allergies  No prescriptions prior to admission    No results found for this or any previous visit (from the past 48 hour(s)). No results found.  Review of Systems  All other systems reviewed and are negative.   There were no vitals taken for this visit. Physical Exam  On examination patient has progressive dehiscence of the surgical incision left foot Assessment/Plan Assessment: Diabetic insensate neuropathy peripheral vascular disease with dehiscence of the limb salvage surgery on the left foot.  Plan: We'll plan for left transtibial amputation. Risk and benefits were discussed patient states he understands and wishes to proceed at this time.  [name] 02/20/2015, 7:02 AM

## 2015-02-20 NOTE — Anesthesia Preprocedure Evaluation (Addendum)
Anesthesia Evaluation  Patient identified by MRN, date of birth, ID band Patient awake    Reviewed: Allergy & Precautions, NPO status , Patient's Chart, lab work & pertinent test results  Airway Mallampati: II  TM Distance: >3 FB Neck ROM: full    Dental  (+) Teeth Intact, Dental Advidsory Given   Pulmonary sleep apnea and Continuous Positive Airway Pressure Ventilation , former smoker,    Pulmonary exam normal       Cardiovascular hypertension, On Medications + Peripheral Vascular Disease Normal cardiovascular exam    Neuro/Psych    GI/Hepatic GERD-  ,  Endo/Other  diabetes, Type 1, Insulin Dependent, Oral Hypoglycemic Agents  Renal/GU Renal InsufficiencyRenal disease     Musculoskeletal   Abdominal   Peds  Hematology  (+) anemia ,   Anesthesia Other Findings   Reproductive/Obstetrics                            Anesthesia Physical Anesthesia Plan  ASA: III  Anesthesia Plan: General   Post-op Pain Management:    Induction: Intravenous  Airway Management Planned: Oral ETT  Additional Equipment:   Intra-op Plan:   Post-operative Plan: Extubation in OR  Informed Consent: I have reviewed the patients History and Physical, chart, labs and discussed the procedure including the risks, benefits and alternatives for the proposed anesthesia with the patient or authorized representative who has indicated his/her understanding and acceptance.   Dental Advisory Given  Plan Discussed with: CRNA, Anesthesiologist and Surgeon  Anesthesia Plan Comments:        Anesthesia Quick Evaluation

## 2015-02-20 NOTE — Consult Note (Signed)
Physical Medicine and Rehabilitation Consult  Reason for Consult: L-BKA Referring Physician: Dr. Lajoyce Corners.   HPI: [name] is a 64 y.o. male with history of DM type 2 with retionopathy, s/p renal-pancreas transplant, PTSD, PVD with left foot infection and failed attempts at limb salvage. 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 Lajoyce Corners. PT/OT evaluations to be done today. CIR consulted in anticipation of rehab needed to help achieve prior level of independence.    Review of Systems  HENT: Negative for hearing loss.   Eyes:       Blind in left eye  Respiratory: Negative for cough and shortness of breath.   Cardiovascular: Negative for chest pain and palpitations.  Gastrointestinal: Negative for heartburn and nausea.  Musculoskeletal: Negative for myalgias and joint pain.  Neurological: Negative for dizziness and headaches.  Psychiatric/Behavioral: The patient is nervous/anxious.       Past Medical History  Diagnosis Date  . Renal disorder   . Diabetes mellitus without complication   . CPAP (continuous positive airway pressure) dependence   . Sleep apnea   . Legally blind in left eye, as defined in [place]   . PTSD (post-traumatic stress disorder)   . PVD (peripheral vascular disease) 01/17/2015  . Hypertension   . Anxiety   . GERD (gastroesophageal reflux disease)     Past Surgical History  Procedure Laterality Date  . Combined kidney-pancreas transplant Left 2009  . Incision and drainage abscess Left 01/13/2015    Procedure: INCISION AND DRAINAGE OF LEFT FOOT FIRST RAY ABCESS;  Surgeon: Kathryne Hitch, MD;  Location: [facility] OR;  Service: Orthopedics;  Laterality: Left;  . Abdominal aortagram N/A 01/21/2015    Procedure: ABDOMINAL Ronny Flurry;  Surgeon: Chuck Hint, MD;  Location: [facility] CATH LAB;  Service: Cardiovascular;  Laterality: N/A;  . Lower extremity angiogram Left 01/21/2015    Procedure: LOWER EXTREMITY ANGIOGRAM;  Surgeon: Chuck Hint, MD;   Location: [facility] CATH LAB;  Service: Cardiovascular;  Laterality: Left;  . Amputation Left 01/23/2015    Procedure: FIRST RAY AMPUTATION/LEFT FOOT;  Surgeon: Nadara Mustard, MD;  Location: [facility] OR;  Service: Orthopedics;  Laterality: Left;  . Av fistula placement    . Colonoscopy       History reviewed. No pertinent family history.    Social History:  Married. Wife works days. Worked for [employer] for 30 years--disabled X 5 years due to visual deficits. Was helping take care of grandchildren at home till earlier this year. He reports that he has quit smoking. He has never used smokeless tobacco. He reports that he drinks alcohol on occasion.  He reports that he does not use illicit drugs.    Allergies: No Known Allergies    Medications Prior to Admission  Medication Sig Dispense Refill  . amLODipine (NORVASC) 10 MG tablet Take 10 mg by mouth every morning.    Marland Kitchen aspirin EC 81 MG EC tablet Take 1 tablet (81 mg total) by mouth daily. 30 tablet 2  . docusate sodium (COLACE) 100 MG capsule Take 100 mg by mouth 2 (two) times daily as needed for mild constipation.     Marland Kitchen glipiZIDE (GLUCOTROL) 10 MG tablet Take 10 mg by mouth daily before breakfast.    . insulin glargine (LANTUS) 100 UNIT/ML injection Inject 5 Units into the skin at bedtime.    . Lancets (FREESTYLE) lancets Use as instructed 100 each 12  . methocarbamol (ROBAXIN) 500 MG tablet Take 1 tablet (500  mg total) by mouth every 6 (six) hours as needed for muscle spasms. 30 tablet 0  . mirtazapine (REMERON) 15 MG tablet Take 7.5 mg by mouth at bedtime as needed (insomnia).    . mycophenolate (CELLCEPT) 250 MG capsule Take 1,000 mg by mouth 2 (two) times daily.    Marland Kitchen oxyCODONE (OXY IR/ROXICODONE) 5 MG immediate release tablet Take 1 tablet (5 mg total) by mouth every 4 (four) hours as needed for moderate pain. 30 tablet 0  . pantoprazole (PROTONIX) 40 MG tablet Take 1 tablet (40 mg total) by mouth daily. 30 tablet 1  . prazosin (MINIPRESS) 2 MG  capsule Take 6 mg by mouth at bedtime.    . sertraline (ZOLOFT) 100 MG tablet Take 50 mg by mouth at bedtime.    . simvastatin (ZOCOR) 40 MG tablet Take 20 mg by mouth at bedtime.    . tacrolimus (PROGRAF) 1 MG capsule Take 5 mg by mouth 2 (two) times daily.     Marland Kitchen allopurinol (ZYLOPRIM) 300 MG tablet Take 300 mg by mouth 2 (two) times daily.     . benzonatate (TESSALON) 100 MG capsule Take 1 capsule (100 mg total) by mouth 3 (three) times daily as needed for cough. 30 capsule 0  . ciprofloxacin (CIPRO) 500 MG tablet Take 1 tablet (500 mg total) by mouth 2 (two) times daily. 20 tablet 0  . guaiFENesin-dextromethorphan (ROBITUSSIN DM) 100-10 MG/5ML syrup Take 5 mLs by mouth every 4 (four) hours as needed for cough.    . Insulin Detemir (LEVEMIR) 100 UNIT/ML Pen Inject 10 Units into the skin daily at 10 pm. (Patient not taking: Reported on 02/19/2015) 15 mL 11    Home:    Functional History:   Functional Status:  Mobility:          ADL:    Cognition: Cognition Orientation Level: Oriented X4    Blood pressure 153/67, pulse 96, temperature 100 F (37.8 C), temperature source Oral, resp. rate 17, height 5' 10.5" (1.791 m), weight 79.379 kg (175 lb), SpO2 97 %. Physical Exam  Nursing note and vitals reviewed. Constitutional: He is oriented to person, place, and time. He appears well-developed and well-nourished.  Restless and complaining of being hot.   HENT:  Head: Normocephalic and atraumatic.  Eyes: Conjunctivae are normal. Pupils are equal, round, and reactive to light.  Neck: Normal range of motion. Neck supple.  Cardiovascular: Regular rhythm.  Tachycardia present.   Respiratory: Effort normal and breath sounds normal.  GI: Soft. Bowel sounds are normal.  Musculoskeletal:  L-BKA with compressive dressing.   Neurological: He is alert and oriented to person, place, and time.  Restless and distracted due to "heat". Speech clear and able to follow basic commands without  difficulty.  Moves BUE/RLE without difficulty.   Skin: Skin is warm and dry.    Results for orders placed or performed during the hospital encounter of 02/20/15 (from the past 24 hour(s))  Glucose, capillary     Status: Abnormal   Collection Time: 02/20/15  9:13 AM  Result Value Ref Range   Glucose-Capillary 189 (H) 65 - 99 mg/dL   Comment 1 Notify RN    Comment 2 Document in Chart   APTT     Status: None   Collection Time: 02/20/15 10:00 AM  Result Value Ref Range   aPTT 34 24 - 37 seconds  CBC     Status: Abnormal   Collection Time: 02/20/15 10:00 AM  Result Value Ref Range  WBC 9.5 4.0 - 10.5 K/uL   RBC 4.19 (L) 4.22 - 5.81 MIL/uL   Hemoglobin 10.6 (L) 13.0 - 17.0 g/dL   HCT 16.133.5 (L) 09.639.0 - 04.552.0 %   MCV 80.0 78.0 - 100.0 fL   MCH 25.3 (L) 26.0 - 34.0 pg   MCHC 31.6 30.0 - 36.0 g/dL   RDW 40.914.2 81.111.5 - 91.415.5 %   Platelets 231 150 - 400 K/uL  Comprehensive metabolic panel     Status: Abnormal   Collection Time: 02/20/15 10:00 AM  Result Value Ref Range   Sodium 138 135 - 145 mmol/L   Potassium 4.4 3.5 - 5.1 mmol/L   Chloride 101 101 - 111 mmol/L   CO2 27 22 - 32 mmol/L   Glucose, Bld 220 (H) 65 - 99 mg/dL   BUN 22 (H) 6 - 20 mg/dL   Creatinine, Ser 7.821.10 0.61 - 1.24 mg/dL   Calcium 9.7 8.9 - 95.610.3 mg/dL   Total Protein 7.3 6.5 - 8.1 g/dL   Albumin 3.4 (L) 3.5 - 5.0 g/dL   AST 11 (L) 15 - 41 U/L   ALT 13 (L) 17 - 63 U/L   Alkaline Phosphatase 78 38 - 126 U/L   Total Bilirubin 0.6 0.3 - 1.2 mg/dL   GFR calc non Af Amer >60 >60 mL/min   GFR calc Af Amer >60 >60 mL/min   Anion gap 10 5 - 15  Protime-INR     Status: None   Collection Time: 02/20/15 10:00 AM  Result Value Ref Range   Prothrombin Time 13.7 11.6 - 15.2 seconds   INR 1.03 0.00 - 1.49  Glucose, capillary     Status: Abnormal   Collection Time: 02/20/15 11:49 AM  Result Value Ref Range   Glucose-Capillary 200 (H) 65 - 99 mg/dL   Comment 1 Notify RN    Comment 2 Document in Chart   Glucose, capillary      Status: Abnormal   Collection Time: 02/20/15  4:46 PM  Result Value Ref Range   Glucose-Capillary 243 (H) 65 - 99 mg/dL  Glucose, capillary     Status: Abnormal   Collection Time: 02/20/15  9:29 PM  Result Value Ref Range   Glucose-Capillary 185 (H) 65 - 99 mg/dL   No results found.  Assessment/Plan: Diagnosis: left BKA 1. Does the need for close, 24 hr/day medical supervision in concert with the patient's rehab needs make it unreasonable for this patient to be served in a less intensive setting? Potentially 2. Co-Morbidities requiring supervision/potential complications: dm, htn 3. Due to bladder management, bowel management, safety, skin/wound care, disease management, medication administration, pain management and patient education, does the patient require 24 hr/day rehab nursing? Yes 4. Does the patient require coordinated care of a physician, rehab nurse, PT (1-2 hrs/day, 5 days/week) and OT (1-2 hrs/day, 5 days/week) to address physical and functional deficits in the context of the above medical diagnosis(es)? Yes Addressing deficits in the following areas: balance, endurance, locomotion, strength, transferring, bowel/bladder control, bathing, dressing, feeding, grooming, toileting and psychosocial support 5. Can the patient actively participate in an intensive therapy program of at least 3 hrs of therapy per day at least 5 days per week? Yes 6. The potential for patient to make measurable gains while on inpatient rehab is excellent 7. Anticipated functional outcomes upon discharge from inpatient rehab are modified independent  with PT, modified independent with OT, n/a with SLP. 8. Estimated rehab length of stay to reach the above functional  goals is: potentially 7-10 days 9. Does the patient have adequate social supports and living environment to accommodate these discharge functional goals? Yes 10. Anticipated D/C setting: Home 11. Anticipated post D/C treatments: HH  therapy 12. Overall Rehab/Functional Prognosis: excellent  RECOMMENDATIONS: This patient's condition is appropriate for continued rehabilitative care in the following setting: CIR Patient has agreed to participate in recommended program. Yes and Potentially Note that insurance prior authorization may be required for reimbursement for recommended care.  Comment: Therapy evals today. Has family at home. Will follow for progress.  Ranelle Oyster, MD, [facility] Physical Medicine & Rehabilitation 02/21/2015     02/21/2015

## 2015-02-20 NOTE — Op Note (Signed)
   Date of Surgery: 02/20/2015  INDICATIONS: [name] is a 64 y.o.-year-old male who has failed salvage intervention and presents at this time with progressive abscess ulceration left foot.Marland Kitchen.  PREOPERATIVE DIAGNOSIS: Abscess ulceration well foot  POSTOPERATIVE DIAGNOSIS: Same.  PROCEDURE: Transtibial amputation left  SURGEON: Lajoyce Cornersuda, M.D.  ANESTHESIA:  general  IV FLUIDS AND URINE: See anesthesia.  ESTIMATED BLOOD LOSS: Minimal mL.  COMPLICATIONS: None.  DESCRIPTION OF PROCEDURE: The patient was brought to the operating room and underwent a general anesthetic. After adequate levels of anesthesia were obtained patient's lower extremity was prepped using DuraPrep draped into a sterile field. A timeout was called.  A transverse incision was made 11 cm distal to the tibial tubercle. This curved proximally and a large posterior flap was created. The tibia was transected 1 cm proximal to the skin incision. The fibula was transected just proximal to the tibial incision. The tibia was beveled anteriorly. A large posterior flap was created. The sciatic nerve was pulled cut and allowed to retract. The vascular bundles were suture ligated with 2-0 silk. The deep and superficial fascial layers were closed using #1 Vicryl. The skin was closed using staples and 2-0 nylon. The wound was covered with Adaptic orthopedic sponges AB dressing Kerlix and Coban. Patient was extubated taken to the PACU in stable condition.  [name] BakerMarcus Duda, MD [facility] 11:31 AM

## 2015-02-20 NOTE — Transfer of Care (Signed)
Immediate Anesthesia Transfer of Care Note  Patient: [name] DikeClarence [name]  Procedure(s) Performed: Procedure(s): AMPUTATION BELOW KNEE (Left)  Patient Location: PACU  Anesthesia Type:General  Level of Consciousness: awake and patient cooperative  Airway & Oxygen Therapy: Patient Spontanous Breathing and Patient connected to nasal cannula oxygen  Post-op Assessment: Report given to RN, Post -op Vital signs reviewed and stable and Patient moving all extremities  Post vital signs: Reviewed and stable  Last Vitals:  Filed Vitals:   02/20/15 1137  BP: 157/102  Pulse: 97  Temp: 36.5 C  Resp: 17    Complications: No apparent anesthesia complications

## 2015-02-20 NOTE — Progress Notes (Signed)
CBG 185 

## 2015-02-21 ENCOUNTER — Encounter (HOSPITAL_COMMUNITY): Payer: Self-pay | Admitting: Orthopedic Surgery

## 2015-02-21 DIAGNOSIS — Z89512 Acquired absence of left leg below knee: Secondary | ICD-10-CM

## 2015-02-21 LAB — GLUCOSE, CAPILLARY
Glucose-Capillary: 180 mg/dL — ABNORMAL HIGH (ref 65–99)
Glucose-Capillary: 121 mg/dL — ABNORMAL HIGH (ref 65–99)
Glucose-Capillary: 101 mg/dL — ABNORMAL HIGH (ref 65–99)

## 2015-02-21 NOTE — Progress Notes (Signed)
Rehab admissions - Evaluated for possible admission.  I met with patient and gave him rehab brochures.  He is interested in coming to inpatient rehab prior to home.  I will check back in am for medical readiness and for rehab bed availability.  Call me for questions.  #[phone]

## 2015-02-21 NOTE — Progress Notes (Signed)
Patient ID: [name] DikeClarence Massar, male   DOB: 05-07-1951, 64 y.o.   MRN: [id number] Postoperative day 1 transtibial amputation. Patient was given instructions to work on knee extension. We will plan for discharge to skilled nursing. Patient was in good spirits this morning.

## 2015-02-21 NOTE — Evaluation (Addendum)
Physical Therapy Evaluation Patient Details Name: [name] MRN: [id number] DOB: 20-Jan-1951 Today's Date: 02/21/2015   History of Present Illness  Patient is a 64 y/o male s/p L BKA. PMH of PTSD, legally blind L eye, DM, renal disorder, PVD and HTN.  Clinical Impression  Patient presents with pain, muscle spasms and weakness s/p L BKA impacting safe mobility. Increased time to perform all mobility and transfers. Education provided on HEP, positioning and importance of obtaining full knee extension. Tolerated Min A to transfer to chair. Would benefit from skilled PT and CIR to maximize independence and ease burden of care prior to return home.     Follow Up Recommendations CIR    Equipment Recommendations  None recommended by PT    Recommendations for Other Services       Precautions / Restrictions Precautions Precautions: Fall Restrictions Weight Bearing Restrictions: Yes LLE Weight Bearing: Non weight bearing      Mobility  Bed Mobility Overal bed mobility: Needs Assistance Bed Mobility: Supine to Sit     Supine to sit: Min guard;HOB elevated     General bed mobility comments: Increased time and difficulty getting to EOB. Cues for technique, "let me do it." Use of rails for support.   Transfers Overall transfer level: Needs assistance Equipment used: Rolling walker (2 wheeled) Transfers: Sit to/from Stand Sit to Stand: Mod assist         General transfer comment: Mod A to rise from EOB with cues for hand placement. Trembling BUEs due to weakness upon standing. Transferred to chair.  Ambulation/Gait Ambulation/Gait assistance: Min assist Ambulation Distance (Feet): 3 Feet Assistive device: Rolling walker (2 wheeled) Gait Pattern/deviations: Step-to pattern;Trunk flexed   Gait velocity interpretation: Below normal speed for age/gender General Gait Details: Hopping pattern to get to chair. Min A for balance. Increased time.  Stairs             Wheelchair Mobility    Modified Rankin (Stroke Patients Only)       Balance Overall balance assessment: Needs assistance Sitting-balance support: Feet supported;Single extremity supported Sitting balance-Leahy Scale: Fair   Postural control: Posterior lean Standing balance support: During functional activity Standing balance-Leahy Scale: Poor Standing balance comment: Relient on RW for support.                             Pertinent Vitals/Pain Pain Assessment: Faces Faces Pain Scale: Hurts even more Pain Location: left residual limb Pain Descriptors / Indicators: Spasm Pain Intervention(s): Limited activity within patient's tolerance;Monitored during session;Patient requesting pain meds-RN notified;Repositioned    Home Living Family/patient expects to be discharged to:: Skilled nursing facility                      Prior Function Level of Independence: Independent with assistive device(s)         Comments: Pt using RW more frequently.     Hand Dominance   Dominant Hand: Right    Extremity/Trunk Assessment   Upper Extremity Assessment: Defer to OT evaluation           Lower Extremity Assessment: LLE deficits/detail;Generalized weakness   LLE Deficits / Details: Difficulty obtaining full knee extension actively and knee flexion due to tight bandage?  Able to perform SLR.      Communication   Communication: No difficulties  Cognition Arousal/Alertness: Awake/alert Behavior During Therapy: WFL for tasks assessed/performed Overall Cognitive Status: Within Functional Limits for tasks assessed  General Comments General comments (skin integrity, edema, etc.): Explained importance of AROM of residual stump and emphasized hip extension and knee extension.    Exercises General Exercises - Lower Extremity Quad Sets: Left;5 reps;Supine Gluteal Sets: Both;10 reps;Supine      Assessment/Plan    PT  Assessment Patient needs continued PT services  PT Diagnosis Difficulty walking;Acute pain;Generalized weakness   PT Problem List Pain;Decreased strength;Decreased range of motion;Impaired sensation;Decreased activity tolerance;Decreased balance;Decreased mobility  PT Treatment Interventions Balance training;Gait training;Functional mobility training;Therapeutic activities;Therapeutic exercise;Patient/family education;Wheelchair mobility training;DME instruction   PT Goals (Current goals can be found in the Care Plan section) Acute Rehab PT Goals Patient Stated Goal: to get better PT Goal Formulation: With patient Time For Goal Achievement: 03/07/15 Potential to Achieve Goals: Fair    Frequency Min 3X/week   Barriers to discharge        Co-evaluation               End of Session Equipment Utilized During Treatment: Gait belt Activity Tolerance: Patient limited by pain;Other (comment) (Spasms) Patient left: in chair;with call bell/phone within reach Nurse Communication: Mobility status         Time: 1117-1140 PT Time Calculation (min) (ACUTE ONLY): 23 min   Charges:   PT Evaluation $Initial PT Evaluation Tier I: 1 Procedure PT Treatments $Therapeutic Activity: 8-22 mins   PT G Codes:        Americus Scheurich A Haila Dena 02/21/2015, 11:55 AM Mylo RedShauna Sejal Cofield, PT, DPT [phone]

## 2015-02-22 ENCOUNTER — Inpatient Hospital Stay (HOSPITAL_COMMUNITY)
Admission: RE | Admit: 2015-02-22 | Discharge: 2015-03-05 | DRG: 300 | Disposition: A | Discharge status: Home Health | Payer: Medicare Other | Source: Intra-hospital | Attending: Physical Medicine & Rehabilitation | Admitting: Physical Medicine & Rehabilitation

## 2015-02-22 DIAGNOSIS — D62 Acute posthemorrhagic anemia: Secondary | ICD-10-CM | POA: Diagnosis present

## 2015-02-22 DIAGNOSIS — Z940 Kidney transplant status: Secondary | ICD-10-CM

## 2015-02-22 DIAGNOSIS — S88112S Complete traumatic amputation at level between knee and ankle, left lower leg, sequela: Secondary | ICD-10-CM | POA: Diagnosis not present

## 2015-02-22 DIAGNOSIS — S88111D Complete traumatic amputation at level between knee and ankle, right lower leg, subsequent encounter: Secondary | ICD-10-CM | POA: Diagnosis not present

## 2015-02-22 DIAGNOSIS — I10 Essential (primary) hypertension: Secondary | ICD-10-CM | POA: Diagnosis present

## 2015-02-22 DIAGNOSIS — Z89512 Acquired absence of left leg below knee: Secondary | ICD-10-CM | POA: Diagnosis not present

## 2015-02-22 DIAGNOSIS — E860 Dehydration: Secondary | ICD-10-CM | POA: Diagnosis present

## 2015-02-22 DIAGNOSIS — I739 Peripheral vascular disease, unspecified: Secondary | ICD-10-CM | POA: Diagnosis present

## 2015-02-22 DIAGNOSIS — E119 Type 2 diabetes mellitus without complications: Secondary | ICD-10-CM | POA: Diagnosis not present

## 2015-02-22 DIAGNOSIS — Z9483 Pancreas transplant status: Secondary | ICD-10-CM | POA: Diagnosis not present

## 2015-02-22 DIAGNOSIS — G546 Phantom limb syndrome with pain: Secondary | ICD-10-CM | POA: Diagnosis not present

## 2015-02-22 DIAGNOSIS — E0840 Diabetes mellitus due to underlying condition with diabetic neuropathy, unspecified: Secondary | ICD-10-CM

## 2015-02-22 DIAGNOSIS — E11319 Type 2 diabetes mellitus with unspecified diabetic retinopathy without macular edema: Secondary | ICD-10-CM | POA: Diagnosis present

## 2015-02-22 DIAGNOSIS — S88119D Complete traumatic amputation at level between knee and ankle, unspecified lower leg, subsequent encounter: Secondary | ICD-10-CM | POA: Diagnosis not present

## 2015-02-22 LAB — GLUCOSE, CAPILLARY
Glucose-Capillary: 184 mg/dL — ABNORMAL HIGH (ref 65–99)
Glucose-Capillary: 104 mg/dL — ABNORMAL HIGH (ref 65–99)
Glucose-Capillary: 123 mg/dL — ABNORMAL HIGH (ref 65–99)
Glucose-Capillary: 116 mg/dL — ABNORMAL HIGH (ref 65–99)

## 2015-02-22 MED ORDER — INSULIN ASPART 100 UNIT/ML ~~LOC~~ SOLN
3.0000 [IU] | Freq: Three times a day (TID) | SUBCUTANEOUS | Status: DC
  Administered 2015-02-23 – 2015-03-05 (×26): 3 [IU] via SUBCUTANEOUS

## 2015-02-22 MED ORDER — INSULIN ASPART 100 UNIT/ML ~~LOC~~ SOLN
0.0000 [IU] | Freq: Three times a day (TID) | SUBCUTANEOUS | Status: DC
  Administered 2015-02-23: 1 [IU] via SUBCUTANEOUS
  Administered 2015-02-24: 2 [IU] via SUBCUTANEOUS
  Administered 2015-02-25 (×2): 1 [IU] via SUBCUTANEOUS
  Administered 2015-02-26: 2 [IU] via SUBCUTANEOUS
  Administered 2015-02-26: 3 [IU] via SUBCUTANEOUS
  Administered 2015-02-27: 2 [IU] via SUBCUTANEOUS
  Administered 2015-02-28: 1 [IU] via SUBCUTANEOUS
  Administered 2015-02-28: 2 [IU] via SUBCUTANEOUS
  Administered 2015-03-01: 1 [IU] via SUBCUTANEOUS
  Administered 2015-03-02: 2 [IU] via SUBCUTANEOUS
  Administered 2015-03-02: 3 [IU] via SUBCUTANEOUS
  Administered 2015-03-03: 2 [IU] via SUBCUTANEOUS
  Administered 2015-03-04: 1 [IU] via SUBCUTANEOUS
  Administered 2015-03-04 – 2015-03-05 (×2): 2 [IU] via SUBCUTANEOUS

## 2015-02-22 MED ORDER — PANTOPRAZOLE SODIUM 40 MG PO TBEC
40.0000 mg | DELAYED_RELEASE_TABLET | Freq: Every day | ORAL | Status: DC
  Administered 2015-02-23 – 2015-03-05 (×11): 40 mg via ORAL
  Filled 2015-02-22 (×10): qty 1

## 2015-02-22 MED ORDER — DOCUSATE SODIUM 100 MG PO CAPS
100.0000 mg | ORAL_CAPSULE | Freq: Two times a day (BID) | ORAL | Status: DC | PRN
  Administered 2015-02-27: 100 mg via ORAL
  Filled 2015-02-22 (×3): qty 1

## 2015-02-22 MED ORDER — METHOCARBAMOL 500 MG PO TABS
500.0000 mg | ORAL_TABLET | Freq: Four times a day (QID) | ORAL | Status: DC | PRN
  Administered 2015-02-24: 500 mg via ORAL
  Filled 2015-02-22: qty 1

## 2015-02-22 MED ORDER — ONDANSETRON HCL 4 MG/2ML IJ SOLN: 4.0000 mg | Freq: Four times a day (QID) | INTRAMUSCULAR | Status: DC | PRN

## 2015-02-22 MED ORDER — FLEET ENEMA 7-19 GM/118ML RE ENEM: 1.0000 | ENEMA | Freq: Once | RECTAL | Status: AC | PRN

## 2015-02-22 MED ORDER — TACROLIMUS 1 MG PO CAPS
5.0000 mg | ORAL_CAPSULE | Freq: Two times a day (BID) | ORAL | Status: DC
  Administered 2015-02-22 – 2015-03-05 (×22): 5 mg via ORAL
  Filled 2015-02-22 (×24): qty 5

## 2015-02-22 MED ORDER — ALLOPURINOL 300 MG PO TABS
300.0000 mg | ORAL_TABLET | Freq: Two times a day (BID) | ORAL | Status: DC
  Administered 2015-02-22 – 2015-03-05 (×22): 300 mg via ORAL
  Filled 2015-02-22 (×24): qty 1

## 2015-02-22 MED ORDER — BISACODYL 10 MG RE SUPP: 10.0000 mg | Freq: Every day | RECTAL | Status: DC | PRN

## 2015-02-22 MED ORDER — ASPIRIN EC 325 MG PO TBEC
325.0000 mg | DELAYED_RELEASE_TABLET | Freq: Every day | ORAL | Status: DC
  Administered 2015-02-23 – 2015-03-05 (×11): 325 mg via ORAL
  Filled 2015-02-22 (×12): qty 1

## 2015-02-22 MED ORDER — OXYCODONE HCL 5 MG PO TABS
5.0000 mg | ORAL_TABLET | ORAL | Status: DC | PRN
  Administered 2015-02-23 – 2015-02-25 (×11): 10 mg via ORAL
  Administered 2015-02-26: 5 mg via ORAL
  Administered 2015-02-26: 10 mg via ORAL
  Administered 2015-02-26: 5 mg via ORAL
  Administered 2015-02-26 – 2015-02-27 (×4): 10 mg via ORAL
  Administered 2015-02-27: 5 mg via ORAL
  Administered 2015-02-28 – 2015-03-04 (×7): 10 mg via ORAL
  Filled 2015-02-22 (×9): qty 2
  Filled 2015-02-22: qty 1
  Filled 2015-02-22 (×16): qty 2

## 2015-02-22 MED ORDER — INSULIN GLARGINE 100 UNIT/ML ~~LOC~~ SOLN
5.0000 [IU] | Freq: Every day | SUBCUTANEOUS | Status: DC
  Administered 2015-02-22 – 2015-03-04 (×11): 5 [IU] via SUBCUTANEOUS
  Filled 2015-02-22 (×13): qty 0.05

## 2015-02-22 MED ORDER — PRAZOSIN HCL 5 MG PO CAPS
6.0000 mg | ORAL_CAPSULE | Freq: Every day | ORAL | Status: DC
  Administered 2015-02-22 – 2015-03-04 (×11): 6 mg via ORAL
  Filled 2015-02-22 (×13): qty 1

## 2015-02-22 MED ORDER — AMLODIPINE BESYLATE 10 MG PO TABS
10.0000 mg | ORAL_TABLET | Freq: Every morning | ORAL | Status: DC
  Administered 2015-02-23 – 2015-03-05 (×11): 10 mg via ORAL
  Filled 2015-02-22 (×12): qty 1

## 2015-02-22 MED ORDER — GUAIFENESIN-DM 100-10 MG/5ML PO SYRP: 5.0000 mL | ORAL_SOLUTION | Freq: Four times a day (QID) | ORAL | Status: DC | PRN

## 2015-02-22 MED ORDER — ALUM & MAG HYDROXIDE-SIMETH 200-200-20 MG/5ML PO SUSP: 30.0000 mL | ORAL | Status: DC | PRN

## 2015-02-22 MED ORDER — ONDANSETRON HCL 4 MG PO TABS: 4.0000 mg | ORAL_TABLET | Freq: Four times a day (QID) | ORAL | Status: DC | PRN

## 2015-02-22 MED ORDER — MYCOPHENOLATE MOFETIL 250 MG PO CAPS
1000.0000 mg | ORAL_CAPSULE | Freq: Two times a day (BID) | ORAL | Status: DC
  Administered 2015-02-22 – 2015-03-05 (×22): 1000 mg via ORAL
  Filled 2015-02-22 (×24): qty 4

## 2015-02-22 MED ORDER — ACETAMINOPHEN 325 MG PO TABS: 325.0000 mg | ORAL_TABLET | ORAL | Status: DC | PRN

## 2015-02-22 MED ORDER — GLIPIZIDE 10 MG PO TABS
10.0000 mg | ORAL_TABLET | Freq: Every day | ORAL | Status: DC
  Administered 2015-02-23 – 2015-03-05 (×11): 10 mg via ORAL
  Filled 2015-02-22 (×12): qty 1

## 2015-02-22 MED ORDER — ASPIRIN EC 81 MG PO TBEC
81.0000 mg | DELAYED_RELEASE_TABLET | Freq: Every day | ORAL | Status: DC
  Filled 2015-02-22: qty 1

## 2015-02-22 MED ORDER — MIRTAZAPINE 7.5 MG PO TABS
7.5000 mg | ORAL_TABLET | Freq: Every evening | ORAL | Status: DC | PRN
  Filled 2015-02-22: qty 1

## 2015-02-22 MED ORDER — ENOXAPARIN SODIUM 40 MG/0.4ML ~~LOC~~ SOLN
40.0000 mg | SUBCUTANEOUS | Status: DC
  Administered 2015-02-22 – 2015-03-04 (×11): 40 mg via SUBCUTANEOUS
  Filled 2015-02-22 (×12): qty 0.4

## 2015-02-22 MED ORDER — SIMVASTATIN 20 MG PO TABS
20.0000 mg | ORAL_TABLET | Freq: Every day | ORAL | Status: DC
  Administered 2015-02-22 – 2015-03-04 (×11): 20 mg via ORAL
  Filled 2015-02-22 (×12): qty 1

## 2015-02-22 MED ORDER — SERTRALINE HCL 50 MG PO TABS: 50.0000 mg | ORAL_TABLET | Freq: Every day | ORAL | Status: DC

## 2015-02-22 NOTE — Progress Notes (Signed)
Ranelle Oyster, MD Physician Signed Physical Medicine and Rehabilitation Consult Note 02/21/2015 8:33 AM  Related encounter: Admission (Current) from 02/20/2015 in [facility]    Expand All Collapse All        Physical Medicine and Rehabilitation Consult  Reason for Consult: L-BKA Referring Physician: Dr. Lajoyce Corners.   HPI: [name] is a 64 y.o. male with history of DM type 2 with retionopathy, s/p renal-pancreas transplant, PTSD, PVD with left foot infection and failed attempts at limb salvage. 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 Lajoyce Corners. PT/OT evaluations to be done today. CIR consulted in anticipation of rehab needed to help achieve prior level of independence.    Review of Systems  HENT: Negative for hearing loss.  Eyes:   Blind in left eye  Respiratory: Negative for cough and shortness of breath.  Cardiovascular: Negative for chest pain and palpitations.  Gastrointestinal: Negative for heartburn and nausea.  Musculoskeletal: Negative for myalgias and joint pain.  Neurological: Negative for dizziness and headaches.  Psychiatric/Behavioral: The patient is nervous/anxious.      Past Medical History  Diagnosis Date  . Renal disorder   . Diabetes mellitus without complication   . CPAP (continuous positive airway pressure) dependence   . Sleep apnea   . Legally blind in left eye, as defined in [place]   . PTSD (post-traumatic stress disorder)   . PVD (peripheral vascular disease) 01/17/2015  . Hypertension   . Anxiety   . GERD (gastroesophageal reflux disease)     Past Surgical History  Procedure Laterality Date  . Combined kidney-pancreas transplant Left 2009  . Incision and drainage abscess Left 01/13/2015    Procedure: INCISION AND DRAINAGE OF LEFT FOOT FIRST RAY ABCESS; Surgeon: Kathryne Hitch, MD; Location: [facility] OR; Service: Orthopedics; Laterality: Left;    . Abdominal aortagram N/A 01/21/2015    Procedure: ABDOMINAL Ronny Flurry; Surgeon: Chuck Hint, MD; Location: [facility] CATH LAB; Service: Cardiovascular; Laterality: N/A;  . Lower extremity angiogram Left 01/21/2015    Procedure: LOWER EXTREMITY ANGIOGRAM; Surgeon: Chuck Hint, MD; Location: [name] CATH LAB; Service: Cardiovascular; Laterality: Left;  . Amputation Left 01/23/2015    Procedure: FIRST RAY AMPUTATION/LEFT FOOT; Surgeon: Nadara Mustard, MD; Location: [facility] OR; Service: Orthopedics; Laterality: Left;  . Av fistula placement    . Colonoscopy       History reviewed. No pertinent family history.    Social History: Married. Wife works days. Worked for [employer] for 30 years--disabled X 5 years due to visual deficits. Was helping take care of grandchildren at home till earlier this year. He reports that he has quit smoking. He has never used smokeless tobacco. He reports that he drinks alcohol on occasion. He reports that he does not use illicit drugs.    Allergies: No Known Allergies    Medications Prior to Admission  Medication Sig Dispense Refill  . amLODipine (NORVASC) 10 MG tablet Take 10 mg by mouth every morning.    Marland Kitchen aspirin EC 81 MG EC tablet Take 1 tablet (81 mg total) by mouth daily. 30 tablet 2  . docusate sodium (COLACE) 100 MG capsule Take 100 mg by mouth 2 (two) times daily as needed for mild constipation.     Marland Kitchen glipiZIDE (GLUCOTROL) 10 MG tablet Take 10 mg by mouth daily before breakfast.    . insulin glargine (LANTUS) 100 UNIT/ML injection Inject 5 Units into the skin at bedtime.    . Lancets (FREESTYLE) lancets Use  as instructed 100 each 12  . methocarbamol (ROBAXIN) 500 MG tablet Take 1 tablet (500 mg total) by mouth every 6 (six) hours as needed for muscle spasms. 30 tablet 0  . mirtazapine (REMERON) 15 MG tablet Take 7.5 mg by mouth at bedtime as needed (insomnia).    .  mycophenolate (CELLCEPT) 250 MG capsule Take 1,000 mg by mouth 2 (two) times daily.    Marland Kitchen oxyCODONE (OXY IR/ROXICODONE) 5 MG immediate release tablet Take 1 tablet (5 mg total) by mouth every 4 (four) hours as needed for moderate pain. 30 tablet 0  . pantoprazole (PROTONIX) 40 MG tablet Take 1 tablet (40 mg total) by mouth daily. 30 tablet 1  . prazosin (MINIPRESS) 2 MG capsule Take 6 mg by mouth at bedtime.    . sertraline (ZOLOFT) 100 MG tablet Take 50 mg by mouth at bedtime.    . simvastatin (ZOCOR) 40 MG tablet Take 20 mg by mouth at bedtime.    . tacrolimus (PROGRAF) 1 MG capsule Take 5 mg by mouth 2 (two) times daily.     Marland Kitchen allopurinol (ZYLOPRIM) 300 MG tablet Take 300 mg by mouth 2 (two) times daily.     . benzonatate (TESSALON) 100 MG capsule Take 1 capsule (100 mg total) by mouth 3 (three) times daily as needed for cough. 30 capsule 0  . ciprofloxacin (CIPRO) 500 MG tablet Take 1 tablet (500 mg total) by mouth 2 (two) times daily. 20 tablet 0  . guaiFENesin-dextromethorphan (ROBITUSSIN DM) 100-10 MG/5ML syrup Take 5 mLs by mouth every 4 (four) hours as needed for cough.    . Insulin Detemir (LEVEMIR) 100 UNIT/ML Pen Inject 10 Units into the skin daily at 10 pm. (Patient not taking: Reported on 02/19/2015) 15 mL 11    Home:    Functional History:   Functional Status:  Mobility:          ADL:    Cognition: Cognition Orientation Level: Oriented X4    Blood pressure 153/67, pulse 96, temperature 100 F (37.8 C), temperature source Oral, resp. rate 17, height 5' 10.5" (1.791 m), weight 79.379 kg (175 lb), SpO2 97 %. Physical Exam  Nursing note and vitals reviewed. Constitutional: He is oriented to person, place, and time. He appears well-developed and well-nourished.  Restless and complaining of being hot.  HENT:  Head: Normocephalic and atraumatic.  Eyes: Conjunctivae are normal. Pupils are equal, round, and  reactive to light.  Neck: Normal range of motion. Neck supple.  Cardiovascular: Regular rhythm. Tachycardia present.  Respiratory: Effort normal and breath sounds normal.  GI: Soft. Bowel sounds are normal.  Musculoskeletal:  L-BKA with compressive dressing.  Neurological: He is alert and oriented to person, place, and time.  Restless and distracted due to "heat". Speech clear and able to follow basic commands without difficulty. Moves BUE/RLE without difficulty.  Skin: Skin is warm and dry.     Lab Results Last 24 Hours    Results for orders placed or performed during the hospital encounter of 02/20/15 (from the past 24 hour(s))  Glucose, capillary Status: Abnormal   Collection Time: 02/20/15 9:13 AM  Result Value Ref Range   Glucose-Capillary 189 (H) 65 - 99 mg/dL   Comment 1 Notify RN    Comment 2 Document in Chart   APTT Status: None   Collection Time: 02/20/15 10:00 AM  Result Value Ref Range   aPTT 34 24 - 37 seconds  CBC Status: Abnormal   Collection Time: 02/20/15 10:00 AM  Result Value Ref Range   WBC 9.5 4.0 - 10.5 K/uL   RBC 4.19 (L) 4.22 - 5.81 MIL/uL   Hemoglobin 10.6 (L) 13.0 - 17.0 g/dL   HCT 16.1 (L) 09.6 - 04.5 %   MCV 80.0 78.0 - 100.0 fL   MCH 25.3 (L) 26.0 - 34.0 pg   MCHC 31.6 30.0 - 36.0 g/dL   RDW 40.9 81.1 - 91.4 %   Platelets 231 150 - 400 K/uL  Comprehensive metabolic panel Status: Abnormal   Collection Time: 02/20/15 10:00 AM  Result Value Ref Range   Sodium 138 135 - 145 mmol/L   Potassium 4.4 3.5 - 5.1 mmol/L   Chloride 101 101 - 111 mmol/L   CO2 27 22 - 32 mmol/L   Glucose, Bld 220 (H) 65 - 99 mg/dL   BUN 22 (H) 6 - 20 mg/dL   Creatinine, Ser 7.82 0.61 - 1.24 mg/dL   Calcium 9.7 8.9 - 95.6 mg/dL   Total Protein 7.3 6.5 - 8.1 g/dL   Albumin 3.4 (L) 3.5 - 5.0 g/dL   AST 11 (L) 15 - 41 U/L   ALT  13 (L) 17 - 63 U/L   Alkaline Phosphatase 78 38 - 126 U/L   Total Bilirubin 0.6 0.3 - 1.2 mg/dL   GFR calc non Af Amer >60 >60 mL/min   GFR calc Af Amer >60 >60 mL/min   Anion gap 10 5 - 15  Protime-INR Status: None   Collection Time: 02/20/15 10:00 AM  Result Value Ref Range   Prothrombin Time 13.7 11.6 - 15.2 seconds   INR 1.03 0.00 - 1.49  Glucose, capillary Status: Abnormal   Collection Time: 02/20/15 11:49 AM  Result Value Ref Range   Glucose-Capillary 200 (H) 65 - 99 mg/dL   Comment 1 Notify RN    Comment 2 Document in Chart   Glucose, capillary Status: Abnormal   Collection Time: 02/20/15 4:46 PM  Result Value Ref Range   Glucose-Capillary 243 (H) 65 - 99 mg/dL  Glucose, capillary Status: Abnormal   Collection Time: 02/20/15 9:29 PM  Result Value Ref Range   Glucose-Capillary 185 (H) 65 - 99 mg/dL      Imaging Results (Last 48 hours)    No results found.    Assessment/Plan: Diagnosis: left BKA 1. Does the need for close, 24 hr/day medical supervision in concert with the patient's rehab needs make it unreasonable for this patient to be served in a less intensive setting? Potentially 2. Co-Morbidities requiring supervision/potential complications: dm, htn 3. Due to bladder management, bowel management, safety, skin/wound care, disease management, medication administration, pain management and patient education, does the patient require 24 hr/day rehab nursing? Yes 4. Does the patient require coordinated care of a physician, rehab nurse, PT (1-2 hrs/day, 5 days/week) and OT (1-2 hrs/day, 5 days/week) to address physical and functional deficits in the context of the above medical diagnosis(es)? Yes Addressing deficits in the following areas: balance, endurance, locomotion, strength, transferring, bowel/bladder control, bathing, dressing, feeding, grooming, toileting and psychosocial  support 5. Can the patient actively participate in an intensive therapy program of at least 3 hrs of therapy per day at least 5 days per week? Yes 6. The potential for patient to make measurable gains while on inpatient rehab is excellent 7. Anticipated functional outcomes upon discharge from inpatient rehab are modified independent with PT, modified independent with OT, n/a with SLP. 8. Estimated rehab length of stay to reach the above functional goals is: potentially  7-10 days 9. Does the patient have adequate social supports and living environment to accommodate these discharge functional goals? Yes 10. Anticipated D/C setting: Home 11. Anticipated post D/C treatments: HH therapy 12. Overall Rehab/Functional Prognosis: excellent  RECOMMENDATIONS: This patient's condition is appropriate for continued rehabilitative care in the following setting: CIR Patient has agreed to participate in recommended program. Yes and Potentially Note that insurance prior authorization may be required for reimbursement for recommended care.  Comment: Therapy evals today. Has family at home. Will follow for progress.  Ranelle OysterZachary T. Swartz, MD, [facility] ServicesFAAPMR [facility] Physical [facility] & Rehabilitation 02/21/2015     02/21/2015       Revision History     Date/Time User Provider Type Action   02/21/2015 9:30 AM Ranelle OysterZachary T Swartz, MD Physician Sign   02/21/2015 9:00 AM Jacquelynn CreePamela S Love, PA-C Physician Assistant Share   View Details Report       Routing History     Date/Time From To Method   02/21/2015 9:30 AM Ranelle OysterZachary T Swartz, MD Ranelle OysterZachary T Swartz, MD In Basket

## 2015-02-22 NOTE — Interval H&P Note (Signed)
[name] DikeClarence [name] was admitted today to Inpatient Rehabilitation with the diagnosis of Left BKA.  The patient's history has been reviewed, patient examined, and there is no change in status.  Patient continues to be appropriate for intensive inpatient rehabilitation.  I have reviewed the patient's chart and labs.  Questions were answered to the patient's satisfaction. The PAPE has been reviewed and assessment remains appropriate.  [name] ColaceKIRSTEINS,ANDREW [name] 02/22/2015, 8:12 PM

## 2015-02-22 NOTE — Progress Notes (Addendum)
Rehab admissions - I can admit to acute inpatient rehab today if patient is medically ready.  I spoke with bedside RN who will call Dr. Lajoyce Cornersuda to assure medical readiness for acute inpatient rehab admit today.  Call me for questions.  [phone]  We have an okay from Dr. Lajoyce Cornersuda for inpatient rehab admission today.  #[phone]

## 2015-02-22 NOTE — H&P (Signed)
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 male with history of DM type 2 with retionopathy, s/p renal-pancreas transplant, PTSD, PVD with left foot infection and failed attempts at limb salvage. 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 Duda. Post op limited by pain as well as weakness affecting mobility. PT evaluation done yesterday and CIR recommended by MD and PT.   Patient did not have any therapy today per his report Review of Systems  Eyes: Positive for blurred vision. Negative for double vision.  Respiratory: Positive for sputum production. Negative for cough and wheezing.  Cardiovascular: Negative for chest pain and palpitations.  Gastrointestinal: Positive for constipation. Negative for heartburn, nausea and abdominal pain.  Genitourinary: Negative for dysuria and frequency.  Musculoskeletal: Positive for joint pain. Negative for myalgias.  Neurological: Positive for sensory change. Negative for dizziness and headaches.  Psychiatric/Behavioral: Negative for depression. The patient is not nervous/anxious.     Past Medical History  Diagnosis Date  . Renal disorder   . Diabetes mellitus without complication   . CPAP (continuous positive airway pressure) dependence   . Sleep apnea   . Legally blind in left eye, as defined in [place]   . PTSD (post-traumatic stress disorder)   . PVD (peripheral vascular disease) 01/17/2015  . Hypertension   . Anxiety   . GERD (gastroesophageal reflux disease)     Past Surgical History  Procedure Laterality Date  . Combined kidney-pancreas transplant Left 2009  . Incision and drainage abscess Left 01/13/2015    Procedure: INCISION AND DRAINAGE OF LEFT FOOT FIRST RAY ABCESS; Surgeon: Christopher Y Blackman, MD; Location: [facility] OR; Service: Orthopedics; Laterality: Left;  . Abdominal aortagram N/A  01/21/2015    Procedure: ABDOMINAL AORTAGRAM; Surgeon: Christopher S Dickson, MD; Location: [facility] CATH LAB; Service: Cardiovascular; Laterality: N/A;  . Lower extremity angiogram Left 01/21/2015    Procedure: LOWER EXTREMITY ANGIOGRAM; Surgeon: Christopher S Dickson, MD; Location: [facility] CATH LAB; Service: Cardiovascular; Laterality: Left;  . Amputation Left 01/23/2015    Procedure: FIRST RAY AMPUTATION/LEFT FOOT; Surgeon: Marcus Duda V, MD; Location: [facility] OR; Service: Orthopedics; Laterality: Left;  . Av fistula placement    . Colonoscopy    . Amputation Left 02/20/2015    Procedure: AMPUTATION BELOW KNEE; Surgeon: Marcus Duda V, MD; Location: [facility] OR; Service: Orthopedics; Laterality: Left;    History reviewed. No pertinent family history.    Social History: Married. Wife works days. Worked for [employer] for 30 years--disabled X 5 years due to visual deficits. Was helping take care of grandchildren at home till earlier this year. He reports that he has quit smoking. He has never used smokeless tobacco. He reports that he drinks alcohol on occasion. He reports that he does not use illicit drugs.    Allergies: No Known Allergies    Medications Prior to Admission  Medication Sig Dispense Refill  . amLODipine (NORVASC) 10 MG tablet Take 10 mg by mouth every morning.    . aspirin EC 81 MG EC tablet Take 1 tablet (81 mg total) by mouth daily. 30 tablet 2  . docusate sodium (COLACE) 100 MG capsule Take 100 mg by mouth 2 (two) times daily as needed for mild constipation.     . glipiZIDE (GLUCOTROL) 10 MG tablet Take 10 mg by mouth daily before breakfast.    . insulin glargine (LANTUS) 100 UNIT/ML injection Inject 5 Units into the skin at bedtime.    .   Lancets (FREESTYLE) lancets Use as instructed 100 each 12  . methocarbamol (ROBAXIN) 500 MG tablet Take 1 tablet (500 mg total) by mouth every 6 (six) hours as needed for  muscle spasms. 30 tablet 0  . mirtazapine (REMERON) 15 MG tablet Take 7.5 mg by mouth at bedtime as needed (insomnia).    . mycophenolate (CELLCEPT) 250 MG capsule Take 1,000 mg by mouth 2 (two) times daily.    . oxyCODONE (OXY IR/ROXICODONE) 5 MG immediate release tablet Take 1 tablet (5 mg total) by mouth every 4 (four) hours as needed for moderate pain. 30 tablet 0  . pantoprazole (PROTONIX) 40 MG tablet Take 1 tablet (40 mg total) by mouth daily. 30 tablet 1  . prazosin (MINIPRESS) 2 MG capsule Take 6 mg by mouth at bedtime.    . sertraline (ZOLOFT) 100 MG tablet Take 50 mg by mouth at bedtime.    . simvastatin (ZOCOR) 40 MG tablet Take 20 mg by mouth at bedtime.    . tacrolimus (PROGRAF) 1 MG capsule Take 5 mg by mouth 2 (two) times daily.     . allopurinol (ZYLOPRIM) 300 MG tablet Take 300 mg by mouth 2 (two) times daily.     . benzonatate (TESSALON) 100 MG capsule Take 1 capsule (100 mg total) by mouth 3 (three) times daily as needed for cough. 30 capsule 0  . ciprofloxacin (CIPRO) 500 MG tablet Take 1 tablet (500 mg total) by mouth 2 (two) times daily. 20 tablet 0  . guaiFENesin-dextromethorphan (ROBITUSSIN DM) 100-10 MG/5ML syrup Take 5 mLs by mouth every 4 (four) hours as needed for cough.    . Insulin Detemir (LEVEMIR) 100 UNIT/ML Pen Inject 10 Units into the skin daily at 10 pm. (Patient not taking: Reported on 02/19/2015) 15 mL 11    Home: Home Living Family/patient expects to be discharged to:: Skilled nursing facility Living Arrangements: Spouse/significant other  Functional History: Prior Function Level of Independence: Independent with assistive device(s) Comments: Pt using RW more frequently.  Functional Status:  Mobility: Bed Mobility Overal bed mobility: Needs Assistance Bed Mobility: Supine to Sit Supine to sit: Min guard, HOB elevated General bed mobility comments: Increased time and difficulty  getting to EOB. Cues for technique, "let me do it." Use of rails for support.  Transfers Overall transfer level: Needs assistance Equipment used: Rolling walker (2 wheeled) Transfers: Sit to/from Stand Sit to Stand: Mod assist General transfer comment: Mod A to rise from EOB with cues for hand placement. Trembling BUEs due to weakness upon standing. Transferred to chair. Ambulation/Gait Ambulation/Gait assistance: Min assist Ambulation Distance (Feet): 3 Feet Assistive device: Rolling walker (2 wheeled) General Gait Details: Hopping pattern to get to chair. Min A for balance. Increased time. Gait Pattern/deviations: Step-to pattern, Trunk flexed Gait velocity interpretation: Below normal speed for age/gender    ADL:    Cognition: Cognition Overall Cognitive Status: Within Functional Limits for tasks assessed Orientation Level: Oriented X4 Cognition Arousal/Alertness: Awake/alert Behavior During Therapy: WFL for tasks assessed/performed Overall Cognitive Status: Within Functional Limits for tasks assessed    Blood pressure 170/74, pulse 100, temperature 97.8 F (36.6 C), temperature source Oral, resp. rate 18, height 5' 10.5" (1.791 m), weight 79.379 kg (175 lb), SpO2 100 %. Physical Exam  Nursing note and vitals reviewed. Constitutional: He is oriented to person, place, and time. He appears well-developed and well-nourished.  HENT:  Head: Normocephalic and atraumatic.  Eyes: Conjunctivae are normal. Pupils are equal, round, and reactive to light.  Neck:   Normal range of motion. Neck supple.  Cardiovascular: Normal rate and regular rhythm.  Respiratory: Effort normal and breath sounds normal. No respiratory distress. He has no wheezes.  GI: Soft. Bowel sounds are normal. He exhibits no distension. There is no tenderness.  Musculoskeletal:  L-BKA with coban dressing and sensitive to touch. Right foot with dry skin and discoloration on plantar surface.  Neurological: He is  alert and oriented to person, place, and time.  Skin: Skin is warm and dry. No erythema.  Psychiatric: He has a normal mood and affect. His behavior is normal. Judgment and thought content normal.  5/5 bilateral deltoid, biceps, triceps, grip 4/5 right hip flexor and knee extensor 4 minus ankle dorsiflexor plantar flexors 4/5 left hip flexor   Lab Results Last 48 Hours    Results for orders placed or performed during the hospital encounter of 02/20/15 (from the past 48 hour(s))  Glucose, capillary Status: Abnormal   Collection Time: 02/20/15 4:46 PM  Result Value Ref Range   Glucose-Capillary 243 (H) 65 - 99 mg/dL  Glucose, capillary Status: Abnormal   Collection Time: 02/20/15 9:29 PM  Result Value Ref Range   Glucose-Capillary 185 (H) 65 - 99 mg/dL  Glucose, capillary Status: Abnormal   Collection Time: 02/21/15 11:06 AM  Result Value Ref Range   Glucose-Capillary 180 (H) 65 - 99 mg/dL  Glucose, capillary Status: Abnormal   Collection Time: 02/21/15 4:30 PM  Result Value Ref Range   Glucose-Capillary 121 (H) 65 - 99 mg/dL  Glucose, capillary Status: Abnormal   Collection Time: 02/21/15 9:34 PM  Result Value Ref Range   Glucose-Capillary 101 (H) 65 - 99 mg/dL  Glucose, capillary Status: Abnormal   Collection Time: 02/22/15 7:02 AM  Result Value Ref Range   Glucose-Capillary 184 (H) 65 - 99 mg/dL  Glucose, capillary Status: Abnormal   Collection Time: 02/22/15 12:18 PM  Result Value Ref Range   Glucose-Capillary 104 (H) 65 - 99 mg/dL      Imaging Results (Last 48 hours)    No results found.       Medical Problem List and Plan: 1. Functional deficits secondary to L-BKA 2. DVT Prophylaxis/Anticoagulation: Pharmaceutical: Lovenox 3. Pain Management: Continue oxycodone prn.  4. Mood: Continue Zoloft. LCSW to follow for evaluation and support.  5. Neuropsych: This  patient is capable of making decisions on his own behalf. 6. Skin/Wound Care: Continue compressive dressing. Routine pressure relief measures.  7. Fluids/Electrolytes/Nutrition: Monitor I/O. Off supplements if intake poor. Check lytes in am. 8. DM type 2: Will monitor BS with ac/hs checks and use SSI for elevated BS. Continue glucotrol and lantus as per home regiment.  9. Dehydration: Push po fluids. Check labs in am.  10. Pancrease/Renal transplant: On Prograf bid and Cellcept bid. 11. HTN: Will monitor BP every 8 hours. Continue Norvasc and minipress daily     Post Admission Physician Evaluation: 1. Functional deficits secondary to L-BKA 2. Patient is admitted to receive collaborative, interdisciplinary care between the physiatrist, rehab nursing staff, and therapy team. 3. Patient's level of medical complexity and substantial therapy needs in context of that medical necessity cannot be provided at a lesser intensity of care such as a SNF. 4. Patient has experienced substantial functional loss from his/her baseline which was documented above under the "Functional History" and "Functional Status" headings. Judging by the patient's diagnosis, physical exam, and functional history, the patient has potential for functional progress which will result in measurable gains while on inpatient rehab. These   gains will be of substantial and practical use upon discharge in facilitating mobility and self-care at the household level. 5. Physiatrist will provide 24 hour management of medical needs as well as oversight of the therapy plan/treatment and provide guidance as appropriate regarding the interaction of the two. 6. 24 hour rehab nursing will assist with bladder management, bowel management, safety, skin/wound care, disease management, medication administration, pain management and patient education and help integrate therapy concepts, techniques,education, etc. 7. PT will assess and treat  for/with: pre gait, gait training, endurance , safety, equipment, neuromuscular re education. Goals are: Mod I. 8. OT will assess and treat for/with: ADLs, Cognitive perceptual skills, Neuromuscular re education, safety, endurance, equipment. Goals are: Mod I. Therapy may proceed with showering this patient, cover residual limb with plastic. 9. SLP will assess and treat for/with: NA. Goals are: NA. 10. Case Management and Social Worker will assess and treat for psychological issues and discharge planning. 11. Team conference will be held weekly to assess progress toward goals and to determine barriers to discharge. 12. Patient will receive at least 3 hours of therapy per day at least 5 days per week. 13. ELOS: 7-10d  14. Prognosis: excellent     Andrew E. Kirsteins M.D. [facility] FAAPM&R (Sports Med, Neuromuscular Med) Diplomate Am Board of Electrodiagnostic Med  02/22/2015             

## 2015-02-22 NOTE — H&P (View-Only) (Signed)
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 male with history of DM type 2 with retionopathy, s/p renal-pancreas transplant, PTSD, PVD with left foot infection and failed attempts at limb salvage. 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 Lajoyce Cornersuda. Post op limited by pain as well as weakness affecting mobility. PT evaluation done yesterday and CIR recommended by MD and PT.   Patient did not have any therapy today per his report Review of Systems  Eyes: Positive for blurred vision. Negative for double vision.  Respiratory: Positive for sputum production. Negative for cough and wheezing.  Cardiovascular: Negative for chest pain and palpitations.  Gastrointestinal: Positive for constipation. Negative for heartburn, nausea and abdominal pain.  Genitourinary: Negative for dysuria and frequency.  Musculoskeletal: Positive for joint pain. Negative for myalgias.  Neurological: Positive for sensory change. Negative for dizziness and headaches.  Psychiatric/Behavioral: Negative for depression. The patient is not nervous/anxious.     Past Medical History  Diagnosis Date  . Renal disorder   . Diabetes mellitus without complication   . CPAP (continuous positive airway pressure) dependence   . Sleep apnea   . [place]ally [place] in left eye, [place]   . PTSD (post-traumatic stress disorder)   . PVD (peripheral vascular disease) 01/17/2015  . Hypertension   . Anxiety   . GERD (gastroesophageal reflux disease)     Past Surgical History  Procedure Laterality Date  . Combined kidney-pancreas transplant Left 2009  . Incision and drainage abscess Left 01/13/2015    Procedure: INCISION AND DRAINAGE OF LEFT FOOT FIRST RAY ABCESS; Surgeon: Kathryne Hitchhristopher Y Blackman, MD; Location: [facility] OR; Service: Orthopedics; Laterality: Left;  . Abdominal aortagram N/A  01/21/2015    Procedure: ABDOMINAL Ronny FlurryAORTAGRAM; Surgeon: Chuck Hinthristopher S Dickson, MD; [facility]; Service: Cardiovascular; Laterality: N/A;  . Lower extremity angiogram Left 01/21/2015    Procedure: LOWER EXTREMITY ANGIOGRAM; Surgeon: Chuck Hinthristopher S Dickson, MD; [facility]; Service: Cardiovascular; Laterality: Left;  . Amputation Left 01/23/2015    Procedure: FIRST RAY AMPUTATION/LEFT FOOT; Surgeon: Nadara MustardMarcus Duda V, MD; [facility]; Service: Orthopedics; Laterality: Left;  . Av fistula placement    . Colonoscopy    . Amputation Left 02/20/2015    Procedure: AMPUTATION BELOW KNEE; Surgeon: Nadara MustardMarcus Duda V, MD; Location: [facility] OR; Service: Orthopedics; Laterality: Left;    History reviewed. No pertinent family history.    Social History: Married. Wife works days. Worked for [employer] for 30 years--disabled X 5 years due to visual deficits. Was helping take care of grandchildren at home till earlier this year. He reports that he has quit smoking. He has never used smokeless tobacco. He reports that he drinks alcohol on occasion. He reports that he does not use illicit drugs.    Allergies: No Known Allergies    Medications Prior to Admission  Medication Sig Dispense Refill  . amLODipine (NORVASC) 10 MG tablet Take 10 mg by mouth every morning.    Marland Kitchen. aspirin EC 81 MG EC tablet Take 1 tablet (81 mg total) by mouth daily. 30 tablet 2  . docusate sodium (COLACE) 100 MG capsule Take 100 mg by mouth 2 (two) times daily as needed for mild constipation.     Marland Kitchen. glipiZIDE (GLUCOTROL) 10 MG tablet Take 10 mg by mouth daily before breakfast.    . insulin glargine (LANTUS) 100 UNIT/ML injection Inject 5 Units into the skin at bedtime.    .Marland Kitchen  Lancets (FREESTYLE) lancets Use as instructed 100 each 12  . methocarbamol (ROBAXIN) 500 MG tablet Take 1 tablet (500 mg total) by mouth every 6 (six) hours as needed for  muscle spasms. 30 tablet 0  . mirtazapine (REMERON) 15 MG tablet Take 7.5 mg by mouth at bedtime as needed (insomnia).    . mycophenolate (CELLCEPT) 250 MG capsule Take 1,000 mg by mouth 2 (two) times daily.    Marland Kitchen oxyCODONE (OXY IR/ROXICODONE) 5 MG immediate release tablet Take 1 tablet (5 mg total) by mouth every 4 (four) hours as needed for moderate pain. 30 tablet 0  . pantoprazole (PROTONIX) 40 MG tablet Take 1 tablet (40 mg total) by mouth daily. 30 tablet 1  . prazosin (MINIPRESS) 2 MG capsule Take 6 mg by mouth at bedtime.    . sertraline (ZOLOFT) 100 MG tablet Take 50 mg by mouth at bedtime.    . simvastatin (ZOCOR) 40 MG tablet Take 20 mg by mouth at bedtime.    . tacrolimus (PROGRAF) 1 MG capsule Take 5 mg by mouth 2 (two) times daily.     Marland Kitchen allopurinol (ZYLOPRIM) 300 MG tablet Take 300 mg by mouth 2 (two) times daily.     . benzonatate (TESSALON) 100 MG capsule Take 1 capsule (100 mg total) by mouth 3 (three) times daily as needed for cough. 30 capsule 0  . ciprofloxacin (CIPRO) 500 MG tablet Take 1 tablet (500 mg total) by mouth 2 (two) times daily. 20 tablet 0  . guaiFENesin-dextromethorphan (ROBITUSSIN DM) 100-10 MG/5ML syrup Take 5 mLs by mouth every 4 (four) hours as needed for cough.    . Insulin Detemir (LEVEMIR) 100 UNIT/ML Pen Inject 10 Units into the skin daily at 10 pm. (Patient not taking: Reported on 02/19/2015) 15 mL 11    Home: Home Living Family/patient expects to be discharged to:: Skilled nursing facility Living Arrangements: Spouse/significant other  Functional History: Prior Function Level of Independence: Independent with assistive device(s) Comments: Pt using RW more frequently.  Functional Status:  Mobility: Bed Mobility Overal bed mobility: Needs Assistance Bed Mobility: Supine to Sit Supine to sit: Min guard, HOB elevated General bed mobility comments: Increased time and difficulty  getting to EOB. Cues for technique, "let me do it." Use of rails for support.  Transfers Overall transfer level: Needs assistance Equipment used: Rolling walker (2 wheeled) Transfers: Sit to/from Stand Sit to Stand: Mod assist General transfer comment: Mod A to rise from EOB with cues for hand placement. Trembling BUEs due to weakness upon standing. Transferred to chair. Ambulation/Gait Ambulation/Gait assistance: Min assist Ambulation Distance (Feet): 3 Feet Assistive device: Rolling walker (2 wheeled) General Gait Details: Hopping pattern to get to chair. Min A for balance. Increased time. Gait Pattern/deviations: Step-to pattern, Trunk flexed Gait velocity interpretation: Below normal speed for age/gender    ADL:    Cognition: Cognition Overall Cognitive Status: Within Functional Limits for tasks assessed Orientation Level: Oriented X4 Cognition Arousal/Alertness: Awake/alert Behavior During Therapy: WFL for tasks assessed/performed Overall Cognitive Status: Within Functional Limits for tasks assessed    Blood pressure 170/74, pulse 100, temperature 97.8 F (36.6 C), temperature source Oral, resp. rate 18, height 5' 10.5" (1.791 m), weight 79.379 kg (175 lb), SpO2 100 %. Physical Exam  Nursing note and vitals reviewed. Constitutional: He is oriented to person, place, and time. He appears well-developed and well-nourished.  HENT:  Head: Normocephalic and atraumatic.  Eyes: Conjunctivae are normal. Pupils are equal, round, and reactive to light.  Neck:  Normal range of motion. Neck supple.  Cardiovascular: Normal rate and regular rhythm.  Respiratory: Effort normal and breath sounds normal. No respiratory distress. He has no wheezes.  GI: Soft. Bowel sounds are normal. He exhibits no distension. There is no tenderness.  Musculoskeletal:  L-BKA with coban dressing and sensitive to touch. Right foot with dry skin and discoloration on plantar surface.  Neurological: He is  alert and oriented to person, place, and time.  Skin: Skin is warm and dry. No erythema.  Psychiatric: He has a normal mood and affect. His behavior is normal. Judgment and thought content normal.  5/5 bilateral deltoid, biceps, triceps, grip 4/5 right hip flexor and knee extensor 4 minus ankle dorsiflexor plantar flexors 4/5 left hip flexor   Lab Results Last 48 Hours    Results for orders placed or performed during the hospital encounter of 02/20/15 (from the past 48 hour(s))  Glucose, capillary Status: Abnormal   Collection Time: 02/20/15 4:46 PM  Result Value Ref Range   Glucose-Capillary 243 (H) 65 - 99 mg/dL  Glucose, capillary Status: Abnormal   Collection Time: 02/20/15 9:29 PM  Result Value Ref Range   Glucose-Capillary 185 (H) 65 - 99 mg/dL  Glucose, capillary Status: Abnormal   Collection Time: 02/21/15 11:06 AM  Result Value Ref Range   Glucose-Capillary 180 (H) 65 - 99 mg/dL  Glucose, capillary Status: Abnormal   Collection Time: 02/21/15 4:30 PM  Result Value Ref Range   Glucose-Capillary 121 (H) 65 - 99 mg/dL  Glucose, capillary Status: Abnormal   Collection Time: 02/21/15 9:34 PM  Result Value Ref Range   Glucose-Capillary 101 (H) 65 - 99 mg/dL  Glucose, capillary Status: Abnormal   Collection Time: 02/22/15 7:02 AM  Result Value Ref Range   Glucose-Capillary 184 (H) 65 - 99 mg/dL  Glucose, capillary Status: Abnormal   Collection Time: 02/22/15 12:18 PM  Result Value Ref Range   Glucose-Capillary 104 (H) 65 - 99 mg/dL      Imaging Results (Last 48 hours)    No results found.       Medical Problem List and Plan: 1. Functional deficits secondary to L-BKA 2. DVT Prophylaxis/Anticoagulation: Pharmaceutical: Lovenox 3. Pain Management: Continue oxycodone prn.  4. Mood: Continue Zoloft. LCSW to follow for evaluation and support.  5. Neuropsych: This  patient is capable of making decisions on his own behalf. 6. Skin/Wound Care: Continue compressive dressing. Routine pressure relief measures.  7. Fluids/Electrolytes/Nutrition: Monitor I/O. Off supplements if intake poor. Check lytes in am. 8. DM type 2: Will monitor BS with ac/hs checks and use SSI for elevated BS. Continue glucotrol and lantus as per home regiment.  9. Dehydration: Push po fluids. Check labs in am.  10. Pancrease/Renal transplant: On Prograf bid and Cellcept bid. 11. HTN: Will monitor BP every 8 hours. Continue Norvasc and minipress daily     Post Admission Physician Evaluation: 1. Functional deficits secondary to L-BKA 2. Patient is admitted to receive collaborative, interdisciplinary care between the physiatrist, rehab nursing staff, and therapy team. 3. Patient's level of medical complexity and substantial therapy needs in context of that medical necessity cannot be provided at a lesser intensity of care such as a SNF. 4. Patient has experienced substantial functional loss from his/her baseline which was documented above under the "Functional History" and "Functional Status" headings. Judging by the patient's diagnosis, physical exam, and functional history, the patient has potential for functional progress which will result in measurable gains while on inpatient rehab. These  gains will be of substantial and practical use upon discharge in facilitating mobility and self-care at the household level. 5. Physiatrist will provide 24 hour management of medical needs as well as oversight of the therapy plan/treatment and provide guidance as appropriate regarding the interaction of the two. 6. 24 hour rehab nursing will assist with bladder management, bowel management, safety, skin/wound care, disease management, medication administration, pain management and patient education and help integrate therapy concepts, techniques,education, etc. 7. PT will assess and treat  for/with: pre gait, gait training, endurance , safety, equipment, neuromuscular re education. Goals are: Mod I. 8. OT will assess and treat for/with: ADLs, Cognitive perceptual skills, Neuromuscular re education, safety, endurance, equipment. Goals are: Mod I. Therapy may proceed with showering this patient, cover residual limb with plastic. 9. SLP will assess and treat for/with: NA. Goals are: NA. 10. Case Management and Social Worker will assess and treat for psychological issues and discharge planning. 11. Team conference will be held weekly to assess progress toward goals and to determine barriers to discharge. 12. Patient will receive at least 3 hours of therapy per day at least 5 days per week. 13. ELOS: 7-10d  14. Prognosis: excellent     Erick Colace M.D. [facility] FAAPM&R (Sports Med, Neuromuscular Med) Diplomate [place] Board of Electrodiagnostic Med  02/22/2015

## 2015-02-22 NOTE — Progress Notes (Signed)
Patient ID: [name] DikeClarence [name], male   DOB: 18-Aug-1951, 64 y.o.   MRN: [id number] Postoperative day 2 left transtibial amputation. Patient is comfortable. Anticipate discharge to skilled nursing. FL 2 was not on the chart yet.

## 2015-02-22 NOTE — Progress Notes (Signed)
[name], RN Rehab Admission Coordinator Signed Physical Medicine and Rehabilitation PMR Pre-admission 02/22/2015 11:43 AM  Related encounter: Admission (Current) from 02/20/2015 in [facility]    Expand All Collapse All   PMR Admission Coordinator Pre-Admission Assessment  Patient: [name] is an 64 y.o., male MRN: [id number] DOB: 1951-05-03 Height: 5' 10.5" (179.1 cm) Weight: 79.379 kg (175 lb)  Insurance Information HMO: No PPO: PCP: IPA: 80/20: OTHER:  PRIMARY: Medicare A/B Policy#: [id number] Subscriber: Rip Harbour. CM Name: Phone#: Fax#:  Pre-Cert#: Employer: Retired Benefits: Phone #: Name: Checked in [place]. Date: 08/28/06 Deduct: $1288 Out of Pocket Max: None Life Max: unlimited CIR: 100% SNF: 100 days with LBD=01/25/15 Outpatient: 80% Co-Pay: 20% Home Health: 100% Co-Pay: none DME: 80% Co-Pay: 20% Providers: patient's choice  Medicaid Application Date: Case Manager:  Disability Application Date: Case Worker:   Emergency Conservator, museum/gallery Information    Name Relation Home Work Mobile   Mausolf,[name] Spouse [phone]       Current Medical History  Patient Admitting Diagnosis: L BKA  History of Present Illness: A 64 y.o. male with history of DM type 2 with retionopathy, s/p renal-pancreas transplant, PTSD, PVD with left foot infection and failed attempts at limb salvage. 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 Lajoyce Corners. PT/OT evaluations to be done today. CIR consulted in anticipation of rehab needed to help achieve prior level of independence.    Past Medical History  Past Medical History  Diagnosis Date  . Renal  disorder   . Diabetes mellitus without complication   . CPAP (continuous positive airway pressure) dependence   . Sleep apnea   . Legally blind in left eye, as defined in [place]   . PTSD (post-traumatic stress disorder)   . PVD (peripheral vascular disease) 01/17/2015  . Hypertension   . Anxiety   . GERD (gastroesophageal reflux disease)     Family History  family history is not on file.  Prior Rehab/Hospitalizations: No inpatient rehab admissions. Says he may have had some kind of rehab after his transplant surgery in 2009.  Has the patient had major surgery during 100 days prior to admission? Yes. Patient was in the hospital 04/16 to 01/25/15 after toe procedure.  Current Medications   Current facility-administered medications:  . 0.9 % sodium chloride infusion, , Intravenous, Continuous, Aldean Baker V, MD, Last Rate: 10 mL/hr at 02/20/15 1841 . acetaminophen (TYLENOL) tablet 650 mg, 650 mg, Oral, Q6H PRN **OR** acetaminophen (TYLENOL) suppository 650 mg, 650 mg, Rectal, Q6H PRN, Aldean Baker V, MD . allopurinol (ZYLOPRIM) tablet 300 mg, 300 mg, Oral, BID, Nadara Mustard, MD, 300 mg at 02/22/15 8657 . amLODipine (NORVASC) tablet 10 mg, 10 mg, Oral, q morning - 10a, Nadara Mustard, MD, 10 mg at 02/22/15 8469 . aspirin EC tablet 325 mg, 325 mg, Oral, Daily, Aldean Baker V, MD, 325 mg at 02/22/15 6295 . aspirin EC tablet 81 mg, 81 mg, Oral, Daily, Nadara Mustard, MD, 81 mg at 02/22/15 2841 . docusate sodium (COLACE) capsule 100 mg, 100 mg, Oral, BID PRN, Aldean Baker V, MD . glipiZIDE (GLUCOTROL) tablet 10 mg, 10 mg, Oral, QAC breakfast, Nadara Mustard, MD, 10 mg at 02/22/15 3244 . HYDROmorphone (DILAUDID) injection 1 mg, 1 mg, Intravenous, Q2H PRN, Aldean Baker V, MD, 1 mg at 02/21/15 1946 . insulin aspart (novoLOG) injection 0-9 Units, 0-9 Units, Subcutaneous, TID WC, Nadara Mustard, MD, 2 Units at 02/22/15 [phone] .  insulin aspart (novoLOG) injection 3  Units, 3 Units, Subcutaneous, TID WC, Nadara Mustard, MD, 3 Units at 02/22/15 [phone] . insulin glargine (LANTUS) injection 5 Units, 5 Units, Subcutaneous, QHS, Nadara Mustard, MD, 5 Units at 02/21/15 2139 . methocarbamol (ROBAXIN) tablet 500 mg, 500 mg, Oral, Q6H PRN, 500 mg at 02/22/15 0921 **OR** methocarbamol (ROBAXIN) 500 mg in dextrose 5 % 50 mL IVPB, 500 mg, Intravenous, Q6H PRN, Aldean Baker V, MD . metoCLOPramide (REGLAN) tablet 5-10 mg, 5-10 mg, Oral, Q8H PRN **OR** metoCLOPramide (REGLAN) injection 5-10 mg, 5-10 mg, Intravenous, Q8H PRN, Aldean Baker V, MD . mirtazapine (REMERON) tablet 7.5 mg, 7.5 mg, Oral, QHS PRN, Aldean Baker V, MD . mycophenolate (CELLCEPT) capsule 1,000 mg, 1,000 mg, Oral, BID, Aldean Baker V, MD, 1,000 mg at 02/22/15 0921 . ondansetron (ZOFRAN) tablet 4 mg, 4 mg, Oral, Q6H PRN **OR** ondansetron (ZOFRAN) injection 4 mg, 4 mg, Intravenous, Q6H PRN, Aldean Baker V, MD, 4 mg at 02/20/15 1147 . oxyCODONE (Oxy IR/ROXICODONE) immediate release tablet 5-10 mg, 5-10 mg, Oral, Q3H PRN, Aldean Baker V, MD, 10 mg at 02/22/15 9604 . pantoprazole (PROTONIX) EC tablet 40 mg, 40 mg, Oral, Daily, Aldean Baker V, MD, 40 mg at 02/22/15 5409 . prazosin (MINIPRESS) capsule 6 mg, 6 mg, Oral, QHS, Aldean Baker V, MD, 6 mg at 02/21/15 2139 . sertraline (ZOLOFT) tablet 50 mg, 50 mg, Oral, QHS, Nadara Mustard, MD, 50 mg at 02/21/15 2139 . simvastatin (ZOCOR) tablet 20 mg, 20 mg, Oral, QHS, Aldean Baker V, MD, 20 mg at 02/21/15 2139 . tacrolimus (PROGRAF) capsule 5 mg, 5 mg, Oral, BID, Nadara Mustard, MD, 5 mg at 02/22/15 8119  Patients Current Diet: Diet Carb Modified Fluid consistency:: Thin; Room service appropriate?: Yes Diet - low sodium heart healthy  Precautions / Restrictions Precautions Precautions: Fall Restrictions Weight Bearing Restrictions: Yes LLE Weight Bearing: Non weight bearing   Has the patient had 2 or more falls or a fall with injury in the past year?No  Prior  Activity Level Community (5-7x/wk): Went out about 3 X a week  Journalist, newspaper / Equipment Home Assistive Devices/Equipment: Environmental consultant (specify type), CBG Meter  Prior Device Use: Indicate devices/aids used by the patient prior to current illness, exacerbation or injury? Walker. Recently used a 2 wheel RW and it is in the room with patient. He was also using a cane at home.  Prior Functional Level Prior Function Level of Independence: Independent with assistive device(s) Comments: Pt using RW more frequently.  Self Care: Did the patient need help bathing, dressing, using the toilet or eating? Needed some help. Wife was assisting with bathing after toe procedure in 04/16.  Indoor Mobility: Did the patient need assistance with walking from room to room (with or without device)? Independent  Stairs: Did the patient need assistance with internal or external stairs (with or without device)? Independent  Functional Cognition: Did the patient need help planning regular tasks such as shopping or remembering to take medications? Needed some help. Wife was assisting with cooking, cleaning and shopping.  Current Functional Level Cognition  Overall Cognitive Status: Within Functional Limits for tasks assessed Orientation Level: Oriented X4   Extremity Assessment (includes Sensation/Coordination)  Upper Extremity Assessment: Defer to OT evaluation  Lower Extremity Assessment: LLE deficits/detail, Generalized weakness LLE Deficits / Details: Difficulty obtaining full knee extension actively and knee flexion due to tight bandage? Able to perform SLR.  LLE Sensation: decreased light touch    ADLs  Anticipate  ADL deficits and needs for OT interventions.   Mobility  Overal bed mobility: Needs Assistance Bed Mobility: Supine to Sit Supine to sit: Min guard, HOB elevated General bed mobility comments: Increased time and difficulty getting to EOB. Cues for technique, "let me  do it." Use of rails for support.     Transfers  Overall transfer level: Needs assistance Equipment used: Rolling walker (2 wheeled) Transfers: Sit to/from Stand Sit to Stand: Mod assist General transfer comment: Mod A to rise from EOB with cues for hand placement. Trembling BUEs due to weakness upon standing. Transferred to chair.    Ambulation / Gait / Stairs / Wheelchair Mobility  Ambulation/Gait Ambulation/Gait assistance: Architect (Feet): 3 Feet Assistive device: Rolling walker (2 wheeled) General Gait Details: Hopping pattern to get to chair. Min A for balance. Increased time. Gait Pattern/deviations: Step-to pattern, Trunk flexed Gait velocity interpretation: Below normal speed for age/gender    Posture / Balance Balance Overall balance assessment: Needs assistance Sitting-balance support: Feet supported, Single extremity supported Sitting balance-Leahy Scale: Fair Postural control: Posterior lean Standing balance support: During functional activity Standing balance-Leahy Scale: Poor Standing balance comment: Relient on RW for support.    Special needs/care consideration BiPAP/CPAP Yes, has CPAP at the bedside CPM No Continuous Drip IV No Dialysis No, but patient has had a renal-pancreas transplant  Life Vest No Oxygen No Special Bed No Trach Size no Wound Vac (area) No  Skin Has a new L BKA with a compression dressing wrap.  Bowel mgmt: Last BM 02/20/15 Bladder mgmt: Voiding in urinal WDL Diabetic mgmt Yes, on oral medications and insulin at home    Previous Home Environment Living Arrangements: Spouse/significant other Home Care Services: No  Discharge Living Setting Plans for Discharge Living Setting: Patient's home, House, Lives with (comment) (Lives with wife and her family.) Type of Home at Discharge: House Discharge Home Layout: Multi-level, Able to live on main level with  bedroom/bathroom Alternate Level Stairs-Number of Steps: 2 steps down to sunken den area Discharge Home Access: Stairs to enter [employer] of Steps: 2 steps entry Does the patient have any problems obtaining your medications?: No  Social/Family/Support Systems Patient Roles: Spouse (Has a wife and her step children.) Contact Information: Isamar Wellbrock - wife [phone] Anticipated Caregiver: self, wife and family Ability/Limitations of Caregiver: Wife works days 8-12 hours 6am-230pm or 6am-630pm Caregiver Availability: Intermittent Discharge Plan Discussed with Primary Caregiver: Yes Is Caregiver In Agreement with Plan?: Yes Does Caregiver/Family have Issues with Lodging/Transportation while Pt is in Rehab?: No  Goals/Additional Needs Patient/Family Goal for Rehab: PT/OT mod I goals Expected length of stay: 7-10 days Cultural Considerations: None Dietary Needs: Carb mod med cal thin liquids Equipment Needs: TBD Pt/Family Agrees to Admission and willing to participate: Yes Program Orientation Provided & Reviewed with Pt/Caregiver Including Roles & Responsibilities: Yes  Decrease burden of Care through IP rehab admission: N/A  Possible need for SNF placement upon discharge: No, not anticipated  Patient Condition: This patient's condition remains as documented in the consult dated 02/21/15, in which the Rehabilitation Physician determined and documented that the patient's condition is appropriate for intensive rehabilitative care in an inpatient rehabilitation facility. Will admit to inpatient rehab today.  Preadmission Screen Completed By: [name], 02/22/2015 2:14 PM ______________________________________________________________________  Discussed status with Dr. Wynn Banker on 05/27/16at 1200 and received telephone approval for admission today.  Admission Coordinator: [name], time1415/Date05/27/16          Cosigned by: Greig Castilla  Burman RiisE Kirsteins, MD  at 02/22/2015 2:20 PM  Revision History     Date/Time User Provider Type Action   02/22/2015 2:20 PM Erick ColaceAndrew E Kirsteins, MD Physician Cosign   02/22/2015 2:18 PM Trish MageEugenia M Yariana Hoaglund, RN Rehab Admission Coordinator Sign   02/22/2015 2:15 PM Trish MageEugenia M Adnan Vanvoorhis, RN Rehab Admission Coordinator Sign   View Details Report

## 2015-02-22 NOTE — PMR Pre-admission (Signed)
PMR Admission Coordinator Pre-Admission Assessment  Patient: [name] is an 64 y.o., male MRN: [id number] DOB: [birth year] Height: 5' 10.5" (179.1 cm) Weight: 79.379 kg (175 lb)              Insurance Information HMO: No    PPO:       PCP:       IPA:       80/20:       OTHER:   PRIMARY: Medicare A/B      Policy#: [id number]      Subscriber: Rip Harbour. CM Name:        Phone#:       Fax#:   Pre-Cert#:        Employer: Retired Benefits:  Phone #:       Name: Checked in [place]. Date: 08/28/06   Deduct:  $1288      Out of Pocket Max: None      Life Max: unlimited CIR: 100%      SNF: 100 days with LBD=01/25/15 Outpatient: 80%     Co-Pay: 20% Home Health: 100%      Co-Pay: none DME: 80%     Co-Pay: 20% Providers: patient's choice  Medicaid Application Date:        Case Manager:   Disability Application Date:        Case Worker:    Emergency Conservator, museum/gallery Information    Name Relation Home Work Mobile   Beckel,[name] Spouse [phone]       Current Medical History  Patient Admitting Diagnosis:  L BKA  History of Present Illness: A 64 y.o. male with history of DM type 2 with retionopathy, s/p renal-pancreas transplant, PTSD, PVD with left foot infection and failed attempts at limb salvage. 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 Lajoyce Corners. PT/OT evaluations to be done today. CIR consulted in anticipation of rehab needed to help achieve prior level of independence.     Past Medical History  Past Medical History  Diagnosis Date  . Renal disorder   . Diabetes mellitus without complication   . CPAP (continuous positive airway pressure) dependence   . Sleep apnea   . Legally blind in left eye, as defined in [place]   . PTSD (post-traumatic stress disorder)   . PVD (peripheral vascular disease) 01/17/2015  . Hypertension   . Anxiety   . GERD (gastroesophageal reflux disease)     Family History  family history is not on file.  Prior  Rehab/Hospitalizations: No inpatient rehab admissions.  Says he may have had some kind of rehab after his transplant surgery in 2009.  Has the patient had major surgery during 100 days prior to admission? Yes.  Patient was in the hospital 04/16 to 01/25/15 after toe procedure.  Current Medications   Current facility-administered medications:  .  0.9 %  sodium chloride infusion, , Intravenous, Continuous, Aldean Baker V, MD, Last Rate: 10 mL/hr at 02/20/15 1841 .  acetaminophen (TYLENOL) tablet 650 mg, 650 mg, Oral, Q6H PRN **OR** acetaminophen (TYLENOL) suppository 650 mg, 650 mg, Rectal, Q6H PRN, Aldean Baker V, MD .  allopurinol (ZYLOPRIM) tablet 300 mg, 300 mg, Oral, BID, Nadara Mustard, MD, 300 mg at 02/22/15 2952 .  amLODipine (NORVASC) tablet 10 mg, 10 mg, Oral, q morning - 10a, Nadara Mustard, MD, 10 mg at 02/22/15 8413 .  aspirin EC tablet 325 mg, 325 mg, Oral, Daily, Aldean Baker V, MD, 325 mg at 02/22/15 2440 .  aspirin EC tablet 81 mg, 81 mg, Oral, Daily, Nadara Mustard, MD, 81 mg at 02/22/15 8413 .  docusate sodium (COLACE) capsule 100 mg, 100 mg, Oral, BID PRN, Aldean Baker V, MD .  glipiZIDE (GLUCOTROL) tablet 10 mg, 10 mg, Oral, QAC breakfast, Nadara Mustard, MD, 10 mg at 02/22/15 2440 .  HYDROmorphone (DILAUDID) injection 1 mg, 1 mg, Intravenous, Q2H PRN, Aldean Baker V, MD, 1 mg at 02/21/15 1946 .  insulin aspart (novoLOG) injection 0-9 Units, 0-9 Units, Subcutaneous, TID WC, Nadara Mustard, MD, 2 Units at 02/22/15 [phone] .  insulin aspart (novoLOG) injection 3 Units, 3 Units, Subcutaneous, TID WC, Nadara Mustard, MD, 3 Units at 02/22/15 [phone] .  insulin glargine (LANTUS) injection 5 Units, 5 Units, Subcutaneous, QHS, Nadara Mustard, MD, 5 Units at 02/21/15 2139 .  methocarbamol (ROBAXIN) tablet 500 mg, 500 mg, Oral, Q6H PRN, 500 mg at 02/22/15 0921 **OR** methocarbamol (ROBAXIN) 500 mg in dextrose 5 % 50 mL IVPB, 500 mg, Intravenous, Q6H PRN, Aldean Baker V, MD .  metoCLOPramide (REGLAN) tablet  5-10 mg, 5-10 mg, Oral, Q8H PRN **OR** metoCLOPramide (REGLAN) injection 5-10 mg, 5-10 mg, Intravenous, Q8H PRN, Aldean Baker V, MD .  mirtazapine (REMERON) tablet 7.5 mg, 7.5 mg, Oral, QHS PRN, Aldean Baker V, MD .  mycophenolate (CELLCEPT) capsule 1,000 mg, 1,000 mg, Oral, BID, Aldean Baker V, MD, 1,000 mg at 02/22/15 0921 .  ondansetron (ZOFRAN) tablet 4 mg, 4 mg, Oral, Q6H PRN **OR** ondansetron (ZOFRAN) injection 4 mg, 4 mg, Intravenous, Q6H PRN, Aldean Baker V, MD, 4 mg at 02/20/15 1147 .  oxyCODONE (Oxy IR/ROXICODONE) immediate release tablet 5-10 mg, 5-10 mg, Oral, Q3H PRN, Aldean Baker V, MD, 10 mg at 02/22/15 6440 .  pantoprazole (PROTONIX) EC tablet 40 mg, 40 mg, Oral, Daily, Aldean Baker V, MD, 40 mg at 02/22/15 3474 .  prazosin (MINIPRESS) capsule 6 mg, 6 mg, Oral, QHS, Aldean Baker V, MD, 6 mg at 02/21/15 2139 .  sertraline (ZOLOFT) tablet 50 mg, 50 mg, Oral, QHS, Nadara Mustard, MD, 50 mg at 02/21/15 2139 .  simvastatin (ZOCOR) tablet 20 mg, 20 mg, Oral, QHS, Aldean Baker V, MD, 20 mg at 02/21/15 2139 .  tacrolimus (PROGRAF) capsule 5 mg, 5 mg, Oral, BID, Nadara Mustard, MD, 5 mg at 02/22/15 2595  Patients Current Diet: Diet Carb Modified Fluid consistency:: Thin; Room service appropriate?: Yes Diet - low sodium heart healthy  Precautions / Restrictions Precautions Precautions: Fall Restrictions Weight Bearing Restrictions: Yes LLE Weight Bearing: Non weight bearing   Has the patient had 2 or more falls or a fall with injury in the past year?No  Prior Activity Level Community (5-7x/wk): Went out about 3 X a week  Journalist, newspaper / Equipment Home Assistive Devices/Equipment: Environmental consultant (specify type), CBG Meter  Prior Device Use: Indicate devices/aids used by the patient prior to current illness, exacerbation or injury? Walker.  Recently used a 2 wheel RW and it is in the room with patient.  He was also using a cane at home.  Prior Functional Level Prior Function Level of  Independence: Independent with assistive device(s) Comments: Pt using RW more frequently.  Self Care: Did the patient need help bathing, dressing, using the toilet or eating?  Needed some help.  Wife was assisting with bathing after toe procedure in 04/16.  Indoor Mobility: Did the patient need assistance with walking from room to room (with or without device)? Independent  Stairs: Did the  patient need assistance with internal or external stairs (with or without device)? Independent  Functional Cognition: Did the patient need help planning regular tasks such as shopping or remembering to take medications? Needed some help.  Wife was assisting with cooking, cleaning and shopping.  Current Functional Level Cognition  Overall Cognitive Status: Within Functional Limits for tasks assessed Orientation Level: Oriented X4    Extremity Assessment (includes Sensation/Coordination)  Upper Extremity Assessment: Defer to OT evaluation  Lower Extremity Assessment: LLE deficits/detail, Generalized weakness LLE Deficits / Details: Difficulty obtaining full knee extension actively and knee flexion due to tight bandage?  Able to perform SLR.  LLE Sensation: decreased light touch    ADLs  Anticipate ADL deficits and needs for OT interventions.   Mobility  Overal bed mobility: Needs Assistance Bed Mobility: Supine to Sit Supine to sit: Min guard, HOB elevated General bed mobility comments: Increased time and difficulty getting to EOB. Cues for technique, "let me do it." Use of rails for support.     Transfers  Overall transfer level: Needs assistance Equipment used: Rolling walker (2 wheeled) Transfers: Sit to/from Stand Sit to Stand: Mod assist General transfer comment: Mod A to rise from EOB with cues for hand placement. Trembling BUEs due to weakness upon standing. Transferred to chair.    Ambulation / Gait / Stairs / Wheelchair Mobility  Ambulation/Gait Ambulation/Gait assistance: Tourist information centre manager (Feet): 3 Feet Assistive device: Rolling walker (2 wheeled) General Gait Details: Hopping pattern to get to chair. Min A for balance. Increased time. Gait Pattern/deviations: Step-to pattern, Trunk flexed Gait velocity interpretation: Below normal speed for age/gender    Posture / Balance Balance Overall balance assessment: Needs assistance Sitting-balance support: Feet supported, Single extremity supported Sitting balance-Leahy Scale: Fair Postural control: Posterior lean Standing balance support: During functional activity Standing balance-Leahy Scale: Poor Standing balance comment: Relient on RW for support.    Special needs/care consideration BiPAP/CPAP Yes, has CPAP at the bedside CPM No Continuous Drip IV No Dialysis No, but patient has had a renal-pancreas transplant         Life Vest No Oxygen No Special Bed No Trach Size no Wound Vac (area) No       Skin Has a new L BKA with a compression dressing wrap.                         Bowel mgmt: Last BM 02/20/15 Bladder mgmt: Voiding in urinal WDL Diabetic mgmt Yes, on oral medications and insulin at home    Previous Home Environment Living Arrangements: Spouse/significant other Home Care Services: No  Discharge Living Setting Plans for Discharge Living Setting: Patient's home, House, Lives with (comment) (Lives with wife and her family.) Type of Home at Discharge: House Discharge Home Layout: Multi-level, Able to live on main level with bedroom/bathroom Alternate Level Stairs-Number of Steps: 2 steps down to sunken den area Discharge Home Access: Stairs to enter [employer] of Steps: 2 steps entry Does the patient have any problems obtaining your medications?: No  Social/Family/Support Systems Patient Roles: Spouse (Has a wife and her step children.) Contact Information: Jemal Miskell - wife [phone] Anticipated Caregiver: self, wife and family Ability/Limitations of  Caregiver: Wife works days 8-12 hours 6am-230pm or 6am-630pm Caregiver Availability: Intermittent Discharge Plan Discussed with Primary Caregiver: Yes Is Caregiver In Agreement with Plan?: Yes Does Caregiver/Family have Issues with Lodging/Transportation while Pt is in Rehab?: No  Goals/Additional Needs Patient/Family Goal for Rehab: PT/OT  mod I goals Expected length of stay: 7-10 days Cultural Considerations: None Dietary Needs: Carb mod med cal thin liquids Equipment Needs: TBD Pt/Family Agrees to Admission and willing to participate: Yes Program Orientation Provided & Reviewed with Pt/Caregiver Including Roles  & Responsibilities: Yes  Decrease burden of Care through IP rehab admission: N/A  Possible need for SNF placement upon discharge: No, not anticipated  Patient Condition: This patient's condition remains as documented in the consult dated 02/21/15, in which the Rehabilitation Physician determined and documented that the patient's condition is appropriate for intensive rehabilitative care in an inpatient rehabilitation facility. Will admit to inpatient rehab today.  Preadmission Screen Completed By:  Trish MageLogue, Trevor Duty M, 02/22/2015 2:14 PM ______________________________________________________________________   Discussed status with Dr. Wynn BankerKirsteins on 05/27/16at 1200 and received telephone approval for admission today.  Admission Coordinator:  Trish MageLogue, Li Fragoso M, time1415/Date05/27/16

## 2015-02-23 ENCOUNTER — Inpatient Hospital Stay (HOSPITAL_COMMUNITY): Payer: Medicare Other | Admitting: *Deleted

## 2015-02-23 ENCOUNTER — Inpatient Hospital Stay (HOSPITAL_COMMUNITY): Payer: Medicare Other | Admitting: Occupational Therapy

## 2015-02-23 ENCOUNTER — Inpatient Hospital Stay (HOSPITAL_COMMUNITY): Payer: Medicare Other | Admitting: Physical Therapy

## 2015-02-23 DIAGNOSIS — S88111D Complete traumatic amputation at level between knee and ankle, right lower leg, subsequent encounter: Secondary | ICD-10-CM

## 2015-02-23 LAB — COMPREHENSIVE METABOLIC PANEL
ALT: 10 U/L — ABNORMAL LOW (ref 17–63)
AST: 13 U/L — ABNORMAL LOW (ref 15–41)
Albumin: 3 g/dL — ABNORMAL LOW (ref 3.5–5.0)
Alkaline Phosphatase: 71 U/L (ref 38–126)
Anion gap: 7 (ref 5–15)
BUN: 12 mg/dL (ref 6–20)
CO2: 28 mmol/L (ref 22–32)
Calcium: 9.3 mg/dL (ref 8.9–10.3)
Chloride: 102 mmol/L (ref 101–111)
Creatinine, Ser: 0.91 mg/dL (ref 0.61–1.24)
GFR calc Af Amer: >60 mL/min (ref >60)
GFR calc non Af Amer: >60 mL/min (ref >60)
Glucose, Bld: 97 mg/dL (ref 65–99)
Potassium: 4.6 mmol/L (ref 3.5–5.1)
Sodium: 137 mmol/L (ref 135–145)
Total Bilirubin: 0.6 mg/dL (ref 0.3–1.2)
Total Protein: 7.1 g/dL (ref 6.5–8.1)

## 2015-02-23 LAB — CBC WITH DIFFERENTIAL/PLATELET
Basophils Absolute: 0 10*3/uL (ref 0.0–0.1)
Basophils Relative: 0 % (ref 0–1)
Eosinophils Absolute: 0.1 10*3/uL (ref 0.0–0.7)
Eosinophils Relative: 1 % (ref 0–5)
HCT: 30.4 % — ABNORMAL LOW (ref 39.0–52.0)
Hemoglobin: 9.4 g/dL — ABNORMAL LOW (ref 13.0–17.0)
Lymphocytes Relative: 21 % (ref 12–46)
Lymphs Abs: 1.7 10*3/uL (ref 0.7–4.0)
MCH: 24.8 pg — ABNORMAL LOW (ref 26.0–34.0)
MCHC: 30.9 g/dL (ref 30.0–36.0)
MCV: 80.2 fL (ref 78.0–100.0)
Monocytes Absolute: 0.5 10*3/uL (ref 0.1–1.0)
Monocytes Relative: 6 % (ref 3–12)
Neutro Abs: 5.7 10*3/uL (ref 1.7–7.7)
Neutrophils Relative %: 72 % (ref 43–77)
Platelets: 212 10*3/uL (ref 150–400)
RBC: 3.79 MIL/uL — ABNORMAL LOW (ref 4.22–5.81)
RDW: 14.1 % (ref 11.5–15.5)
WBC: 7.9 10*3/uL (ref 4.0–10.5)

## 2015-02-23 LAB — GLUCOSE, CAPILLARY
Glucose-Capillary: 107 mg/dL — ABNORMAL HIGH (ref 65–99)
Glucose-Capillary: 137 mg/dL — ABNORMAL HIGH (ref 65–99)
Glucose-Capillary: 113 mg/dL — ABNORMAL HIGH (ref 65–99)
Glucose-Capillary: 151 mg/dL — ABNORMAL HIGH (ref 65–99)

## 2015-02-23 NOTE — Discharge Summary (Signed)
Physician Discharge Summary  Patient ID: [name] MRN: [id number] DOB/AGE: [birth year] 64 y.o.  Admit date: 02/20/2015 Discharge date: 02/23/2015  Admission Diagnoses: Gangrene osteomyelitis left foot  Discharge Diagnoses:  Active Problems:   S/P BKA (below knee amputation) unilateral   Discharged Condition: stable  Hospital Course: Patient's hospital course was essentially unremarkable. He progressed slowly with therapy and was felt to need rehabilitation. Patient was discharged to inpatient rehabilitation.  Consults: None  Significant Diagnostic Studies: labs: Routine labs  Treatments: surgery: See operative note  Discharge Exam: Blood pressure 153/72, pulse 84, temperature 98.6 F (37 C), temperature source Oral, resp. rate 18, height 5' 10.5" (1.791 m), weight 79.379 kg (175 lb), SpO2 99 %. Incision/Wound: dressing clean and dry  Disposition: 90-DC/txfr to inpt rehab facility with planned acute care hosp IP admission  Discharge Instructions    Call MD / Call 911    Complete by:  As directed   If you experience chest pain or shortness of breath, CALL 911 and be transported to the hospital emergency room.  If you develope a fever above 101 F, pus (white drainage) or increased drainage or redness at the wound, or calf pain, call your surgeon's office.     Constipation Prevention    Complete by:  As directed   Drink plenty of fluids.  Prune juice may be helpful.  You may use a stool softener, such as Colace (over the counter) 100 mg twice a day.  Use MiraLax (over the counter) for constipation as needed.     Diet - low sodium heart healthy    Complete by:  As directed      Increase activity slowly as tolerated    Complete by:  As directed             Medication List    TAKE these medications        allopurinol 300 MG tablet  Commonly known as:  ZYLOPRIM  Take 300 mg by mouth 2 (two) times daily.     amLODipine 10 MG tablet  Commonly known as:  NORVASC  Take  10 mg by mouth every morning.     aspirin 81 MG EC tablet  Take 1 tablet (81 mg total) by mouth daily.     benzonatate 100 MG capsule  Commonly known as:  TESSALON  Take 1 capsule (100 mg total) by mouth 3 (three) times daily as needed for cough.     ciprofloxacin 500 MG tablet  Commonly known as:  CIPRO  Take 1 tablet (500 mg total) by mouth 2 (two) times daily.     docusate sodium 100 MG capsule  Commonly known as:  COLACE  Take 100 mg by mouth 2 (two) times daily as needed for mild constipation.     freestyle lancets  Use as instructed     glipiZIDE 10 MG tablet  Commonly known as:  GLUCOTROL  Take 10 mg by mouth daily before breakfast.     guaiFENesin-dextromethorphan 100-10 MG/5ML syrup  Commonly known as:  ROBITUSSIN DM  Take 5 mLs by mouth every 4 (four) hours as needed for cough.     Insulin Detemir 100 UNIT/ML Pen  Commonly known as:  LEVEMIR  Inject 10 Units into the skin daily at 10 pm.     insulin glargine 100 UNIT/ML injection  Commonly known as:  LANTUS  Inject 5 Units into the skin at bedtime.     methocarbamol 500 MG tablet  Commonly known as:  ROBAXIN  Take 1 tablet (500 mg total) by mouth every 6 (six) hours as needed for muscle spasms.     mirtazapine 15 MG tablet  Commonly known as:  REMERON  Take 7.5 mg by mouth at bedtime as needed (insomnia).     mycophenolate 250 MG capsule  Commonly known as:  CELLCEPT  Take 1,000 mg by mouth 2 (two) times daily.     oxyCODONE 5 MG immediate release tablet  Commonly known as:  Oxy IR/ROXICODONE  Take 1 tablet (5 mg total) by mouth every 4 (four) hours as needed for moderate pain.     pantoprazole 40 MG tablet  Commonly known as:  PROTONIX  Take 1 tablet (40 mg total) by mouth daily.     prazosin 2 MG capsule  Commonly known as:  MINIPRESS  Take 6 mg by mouth at bedtime.     sertraline 100 MG tablet  Commonly known as:  ZOLOFT  Take 50 mg by mouth at bedtime.     simvastatin 40 MG tablet   Commonly known as:  ZOCOR  Take 20 mg by mouth at bedtime.     tacrolimus 1 MG capsule  Commonly known as:  PROGRAF  Take 5 mg by mouth 2 (two) times daily.           Follow-up Information    Follow up with Kerwin Augustus V, MD In 3 weeks.   Specialty:  Orthopedic Surgery   Contact information:   [address] [place] [address] [phone]       Signed: Nadara MustardDUDA,Yonah Tangeman V 02/23/2015, 11:38 AM

## 2015-02-23 NOTE — Progress Notes (Signed)
Complains of phantom pain during night. PRN oxy IR given at 0125 and 0554.  Continent with urinal, urine amber. PVR= zero. Decreased vision, blind left eye. [name], [name]

## 2015-02-23 NOTE — Evaluation (Signed)
Physical Therapy Assessment and Plan  Patient Details  Name: [name] MRN: [id number] Date of Birth: 1950-10-05  PT Diagnosis: Muscle weakness Rehab Potential: Good ELOS: 10-14 days   Today's Date: 02/23/2015 PT Individual Time: 1255-1355 PT Individual Time Calculation (min): 60 min    Problem List:  Patient Active Problem List   Diagnosis Date Noted  . Unilateral complete BKA 02/22/2015  . S/P BKA (below knee amputation) unilateral 02/20/2015  . Ischemic ulcer of toe   . Peripheral vascular disease   . PVD (peripheral vascular disease) 01/17/2015  . Cellulitis and abscess of foot 01/13/2015  . Cellulitis and abscess of foot excluding toe 01/13/2015  . Diabetes mellitus without complication 73/41/9379  . Leukocytosis 01/13/2015  . Diabetes 12/19/2007  . DYSLIPIDEMIA 12/19/2007  . ANEMIA 12/19/2007  . ANXIETY 12/19/2007  . Essential hypertension 12/19/2007  . TRANSIENT ISCHEMIC ATTACK 12/19/2007  . PERIPHERAL VASCULAR DISEASE 12/19/2007  . RENAL FAILURE 12/19/2007    Past Medical History:  Past Medical History  Diagnosis Date  . Renal disorder   . Diabetes mellitus without complication   . CPAP (continuous positive airway pressure) dependence   . Sleep apnea   . Legally blind in left eye, as defined in [place]   . PTSD (post-traumatic stress disorder)   . PVD (peripheral vascular disease) 01/17/2015  . Hypertension   . Anxiety   . GERD (gastroesophageal reflux disease)    Past Surgical History:  Past Surgical History  Procedure Laterality Date  . Combined kidney-pancreas transplant Left 2009  . Incision and drainage abscess Left 01/13/2015    Procedure: INCISION AND DRAINAGE OF LEFT FOOT FIRST RAY ABCESS;  Surgeon: Mcarthur Rossetti, MD;  Location: [facility];  Service: Orthopedics;  Laterality: Left;  . Abdominal aortagram N/A 01/21/2015    Procedure: ABDOMINAL Maxcine Ham;  Surgeon: Angelia Mould, MD;  Location: [facility] CATH LAB;  Service: Cardiovascular;   Laterality: N/A;  . Lower extremity angiogram Left 01/21/2015    Procedure: LOWER EXTREMITY ANGIOGRAM;  Surgeon: Angelia Mould, MD;  Location: [facility] CATH LAB;  Service: Cardiovascular;  Laterality: Left;  . Amputation Left 01/23/2015    Procedure: FIRST RAY AMPUTATION/LEFT FOOT;  Surgeon: Newt Minion, MD;  Location: [facility];  Service: Orthopedics;  Laterality: Left;  . Av fistula placement    . Colonoscopy    . Amputation Left 02/20/2015    Procedure: AMPUTATION BELOW KNEE;  Surgeon: Newt Minion, MD;  Location: [place];  Service: Orthopedics;  Laterality: Left;    Assessment & Plan Clinical ImpressionClarence [name] is a 64 y.o. male with history of DM type 2 with retionopathy, s/p renal-pancreas transplant, PTSD, PVD with left foot infection and failed attempts at limb salvage. 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 Duda.:Patient transferred to [facility] on 02/22/2015 .   Patient currently requires min with mobility secondary to muscle weakness.  Prior to hospitalization, patient was independent  with mobility and lived with Spouse in a House home.  Home access is 2Stairs to enter.  Patient will benefit from skilled PT intervention to maximize safe functional mobility for planned discharge home vs SNF,pendiong patient decision.  Anticipate patient will benefit from follow up [place] at discharge.  PT - End of Session Activity Tolerance: Tolerates 30+ min activity with multiple rests Endurance Deficit: Yes PT Assessment Rehab Potential (ACUTE/IP ONLY): Good Barriers to Discharge: Decreased caregiver support PT Patient demonstrates impairments in the following area(s): Balance;Endurance;Safety;Pain PT Transfers Functional Problem(s): Bed Mobility;Bed to Chair PT  Locomotion Functional Problem(s): Ambulation;Stairs;Wheelchair Mobility PT Plan PT Intensity: Minimum of 1-2 x/day ,45 to 90 minutes PT Frequency: 5 out of 7 days PT Duration Estimated Length of Stay: 10-14 days PT  Treatment/Interventions: Ambulation/gait training;Discharge planning;Functional mobility training;Therapeutic Activities;UE/LE Strength taining/ROM;Therapeutic Exercise;UE/LE Coordination activities;Neuromuscular re-education PT Transfers Anticipated Outcome(s): Modified independance PT Locomotion Anticipated Outcome(s): Modified independance PT Recommendation Recommendations for Other Services: Neuropsych consult Follow Up Recommendations: Home health PT (Maybe SNF due to questionable home situation) Patient destination: Home Equipment Recommended: To be determined  Skilled Therapeutic Intervention  Patient up in a w/c at the beginning of session, agrees to therapy intervention. Evaluation and assessment completed ,patient oriented to unit and involved in establishing goals.  Patient performed and was instructed and assisted in w/c mobility and management of w/c parts.-min A and min VC.  Sit to stand x4 with min A for balance and FWW as AD. Transfer to mat with min A and increased cues for sequencing. Sit<=> supine with min A-with activity patient starts complaining of pain in L LE and phantom pain present.   ROM of L Le assessed and -28 degrees of extension missing in L Knee, with patient very anxious about moving that joint, protective reactions, does not allow this therapist to stretch gently. When lying on side assessed Hip ROM , decreased extension and decreased extensor strength with flexor contractures starting to form as patient stays in sitting position for most of the day,unable to lay on his stomach.  Patient returned to sitting EOM with good balance.  Training and instruction on hopping on R LE with use of FWW 1 x 7 feet and 1 x 24 feet with close w/c follow and min A. Patient becomes very fatigued and complains of increased pain in amputated foot. RN notified ,pain medicine to be given. Patient was very anxious about any activities involving his L residual limb, stated he does not feel  he dealt with his loss at this time, and is even scared to touch it. Education provided on desensitizing techniques. Patient also reported that his family situation might be difficult upon returning home ( 3 grand children living home, no support ,), and he is considering going to SNF instead of home .   PT Evaluation Precautions/Restrictions Precautions Precautions: Fall Restrictions Weight Bearing Restrictions: Yes Other Position/Activity Restrictions: L LE General Chart Reviewed: Yes Response to Previous Treatment: Patient with no complaints from previous session. Family/Caregiver Present: No Vital Signs Pain Pain Assessment Pain Assessment: 0-10 Pain Score: 8  Pain Type: Acute pain Pain Location: Leg Pain Orientation: Left Pain Descriptors / Indicators: Aching Pain Onset: Gradual Patients Stated Pain Goal: 0 Pain Intervention(s): Medication (See eMAR) Multiple Pain Sites: No Home Living/Prior Functioning Home Living Available Help at Discharge: Available PRN/intermittently;Family Type of Home: House Home Access: Stairs to enter Technical brewer of Steps: 2 Entrance Stairs-Rails: None Home Layout: One level  Lives With: Spouse Prior Function Level of Independence: Independent with basic ADLs  Able to Take Stairs?: Reciprically Driving: No Vocation: Retired  Associate Professor Overall Cognitive Status: Within Functional Limits for tasks assessed Arousal/Alertness: Awake/alert Orientation Level: Oriented X4 Attention: Focused Focused Attention: Appears intact Memory: Appears intact Awareness: Appears intact Problem Solving: Appears intact Sensation Sensation Light Touch: Appears Intact Hot/Cold: Appears Intact Coordination Gross Motor Movements are Fluid and Coordinated: Yes Fine Motor Movements are Fluid and Coordinated: Yes Motor  Motor Motor: Within Functional Limits  Mobility Bed Mobility Bed Mobility: Rolling Right;Rolling Left Rolling Right: 4: Min  guard Rolling Left: 4:  Min guard Transfers Transfers: Yes Sit to Stand: 4: Min assist Stand to Sit: 4: Min assist Stand Pivot Transfers: 4: Min assist Locomotion  Ambulation Ambulation: Yes Ambulation/Gait Assistance: 4: Min assist Ambulation Distance (Feet): 23 Feet Assistive device: Rolling walker Ambulation/Gait Assistance Details: Verbal cues for precautions/safety Gait Gait: Yes Gait Pattern: Impaired Gait Pattern:  (Hopping on R LE , L BKA) Gait velocity: decreased Stairs / Additional Locomotion Stairs: No Wheelchair Mobility Wheelchair Mobility: Yes Wheelchair Assistance: 4: Advertising account executive Details: Verbal cues for Marketing executive: Both upper extremities;Right lower extremity Wheelchair Parts Management: Needs assistance Distance: 120  Trunk/Postural Assessment  Postural Control Postural Control: Deficits on evaluation (Increased forward lean due to hip flexor contractures)    LLE AROM (degrees) Left Knee Extension: 30 (-28-59) LLE Strength Left Hip Extension: 2+/5 Left Knee Extension: 2+/5  FIM:  FIM - Bed/Chair Transfer Bed/Chair Transfer Assistive Devices: Adult nurse Transfer: 4: Supine > Sit: Min A (steadying Pt. > 75%/lift 1 leg);4: Sit > Supine: Min A (steadying pt. > 75%/lift 1 leg);4: Chair or W/C > Bed: Min A (steadying Pt. > 75%);4: Bed > Chair or W/C: Min A (steadying Pt. > 75%) FIM - Locomotion: Wheelchair Distance: 120 Locomotion: Wheelchair: 2: Travels 50 - 149 ft with minimal assistance (Pt.>75%) FIM - Locomotion: Ambulation Locomotion: Ambulation Assistive Devices: Administrator Ambulation/Gait Assistance: 4: Min assist Locomotion: Ambulation: 1: Travels less than 50 ft with minimal assistance (Pt.>75%) (24) FIM - Locomotion: Stairs Locomotion: Stairs: 0: Activity did not occur   Refer to Care Plan for Long Term Goals  Recommendations for other services: Neuropsych  Discharge Criteria: Patient  will be discharged from PT if patient refuses treatment 3 consecutive times without medical reason, if treatment goals not met, if there is a change in medical status, if patient makes no progress towards goals or if patient is discharged from hospital.  The above assessment, treatment plan, treatment alternatives and goals were discussed and mutually agreed upon: by patient  Guadlupe Spanish 02/23/2015, 4:05 PM

## 2015-02-23 NOTE — Progress Notes (Signed)
Physical Therapy Session Note  Patient Details  Name: [name] DikeClarence Coltrain MRN: [id number] Date of Birth: 1951-03-30  Today's Date: 02/23/2015 PT Individual Time: 1453-1540 PT Individual Time Calculation (min): 47 min   Short Term Goals: Week 1:  PT Short Term Goal 1 (Week 1): Patient wil decrease L Knee flexion contracture to -15 of extension. PT Short Term Goal 2 (Week 1): Patient will increase ability to transfer sit to stand with Supervison PT Short Term Goal 3 (Week 1): Patient will increase activity tolerance to be able to stand for up to 10 min with activities PT Short Term Goal 4 (Week 1): Patient will be able to ambulate with FWW on a distance of 60 feet with Supervision PT Short Term Goal 5 (Week 1): Patient will be able to [facility] and down 2 steps with min A  Skilled Therapeutic Interventions/Progress Updates:  Pt received in w/c listening to music.  Pt noted to be sitting on pillow and with L residual limb flexed and in dependent position.  Pt reporting increased pain in LLE but reports being premedicated.  Pt re-educated on all w/c parts and management with intermittent hand over hand cues due to impaired vision.  Performed w/c mobility x 150' with supervision but min A to set up w/c beside mat to prepare for transfer.  Pt performed transfer w/c <> mat squat pivot min A with min verbal cues.  Once on mat pt engaged in LLE ROM and strengthening exercises with focus on increasing knee extension ROM with HS stretches and quad sets.  Also performed LLE hip IR, hip ABD, and HS curls.  While pt engaged in exercises therapist obtained cushion and donned amputee support pad for w/c.  Once pt back in w/c limb support pad lengthened and adjusted for proper positioning.  Pt educated on importance of maintaining ROM in LLE and prevention of contractures for pain management and future fit of prosthesis.  Pt verbalized understanding.  Returned to room and set up in w/c with quick release belt in place  and all items within reach.   Therapy Documentation Precautions:  Precautions Precautions: Fall Restrictions Weight Bearing Restrictions: Yes LLE Weight Bearing: Non weight bearing (LLE is amputated below knee) Other Position/Activity Restrictions: L LE Vital Signs: Therapy Vitals Temp: 98.4 F (36.9 C) Temp Source: Oral Pulse Rate: 89 Resp: 14 BP: 130/66 mmHg Patient Position (if appropriate): Sitting Oxygen Therapy SpO2: 98 % O2 Device: Not Delivered Pain: Pain Assessment Pain Assessment: Faces Pain Score: 8  Faces Pain Scale: Hurts even more Pain Type: Acute pain Pain Location: Leg Pain Orientation: Left Pain Descriptors / Indicators: Sharp;Sore Pain Onset: Sudden Patients Stated Pain Goal: 0 Pain Intervention(s): Rest;Repositioned Multiple Pain Sites: No  See FIM for current functional status  Therapy/Group: Individual Therapy  Edman CircleHall, Lizett Chowning [facility] Surgery CenterFaucette 02/23/2015, 4:27 PM

## 2015-02-23 NOTE — Progress Notes (Signed)
Patient ID: [name], male   DOB: [birth year], 64 y.o.   MRN: [id number]   02/23/15.   [age] y.o. male with history of DM type 2 with retionopathy, s/p renal-pancreas transplant, PTSD, PVD with left foot infection and failed attempts at limb salvage. 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 Lajoyce Cornersuda. Post op limited by pain as well as weakness affecting mobility. PT evaluation done and patient admitted for CIR 02/22/15.  Minimal discomfort- good night.  Past Medical History  Diagnosis Date  . Renal disorder   . Diabetes mellitus without complication   . CPAP (continuous positive airway pressure) dependence   . Sleep apnea   . Legally blind in left eye, [place]   . PTSD (post-traumatic stress disorder)   . PVD (peripheral vascular disease) 01/17/2015  . Hypertension   . Anxiety   . GERD (gastroesophageal reflux disease)     Patient Vitals for the past 24 hrs:  BP Temp Temp src Pulse Resp SpO2 Height Weight  02/23/15 0527 (!) 153/60 mmHg 98.9 F (37.2 C) Oral 84 18 96 % - -  02/22/15 2107 134/66 mmHg - - - - - - -  02/22/15 1700 (!) 132/59 mmHg 98.6 F (37 C) Oral 79 18 96 % 5\' 10"  (1.778 m) 168 lb (76.204 kg)     Intake/Output Summary (Last 24 hours) at 02/23/15 0906 Last data filed at 02/23/15 0531  Gross per 24 hour  Intake    480 ml  Output   1000 ml  Net   -520 ml   Lab Results  Component Value Date   HGBA1C 11.8* 01/13/2015    CBG (last 3)   Recent Labs  02/22/15 1638 02/22/15 2113 02/23/15 0650  GLUCAP 123* 116* 107*       Physical Exam  Nursing note and vitals reviewed. Constitutional: He is oriented to person, place, and time. He appears well-developed and well-nourished.  HENT:  Head: Normocephalic and atraumatic.  Eyes: Conjunctivae are normal. Pupils are equal, round, and reactive to light.  Neck: Normal range of motion. Neck supple.  Cardiovascular: Normal rate and regular rhythm. Transmitted systolic and diastolic sounds from  AVF Respiratory: Effort normal and breath sounds normal. No respiratory distress. He has no wheezes.  GI: Soft. Bowel sounds are normal. He exhibits no distension. There is no tenderness.  Musculoskeletal:  L-BKA with coban dressing R arm AVF Neurological: He is alert and oriented to person, place, and time.  Skin: Skin is warm and dry. No erythema.     Medical Problem List and Plan: 1. Functional deficits secondary to L-BKA 2. DVT Prophylaxis/Anticoagulation: Pharmaceutical: Lovenox 3. Pain Management: Continue oxycodone prn.   4. Skin/Wound Care: Continue compressive dressing. Routine pressure relief measures.  5. Fluids/Electrolytes/Nutrition: Monitor I/O. Off supplements if intake poor. Check lytes in am. 6. DM type 2: Will monitor BS with ac/hs checks and use SSI for elevated BS. Continue glucotrol and lantus as per home regiment.  7. Dehydration: Push po fluids. Check labs in am.  8. Pancrease/Renal transplant: On Prograf bid and Cellcept bid. 9. HTN: Will monitor BP every 8 hours. Continue Norvasc and minipress daily 10.  Post op anemia.  HCT down to 30.4

## 2015-02-24 ENCOUNTER — Inpatient Hospital Stay (HOSPITAL_COMMUNITY): Payer: Medicare Other | Admitting: Occupational Therapy

## 2015-02-24 LAB — GLUCOSE, CAPILLARY
Glucose-Capillary: 93 mg/dL (ref 65–99)
Glucose-Capillary: 188 mg/dL — ABNORMAL HIGH (ref 65–99)
Glucose-Capillary: 107 mg/dL — ABNORMAL HIGH (ref 65–99)
Glucose-Capillary: 133 mg/dL — ABNORMAL HIGH (ref 65–99)

## 2015-02-24 NOTE — Progress Notes (Signed)
Occupational Therapy Session Note  Patient Details  Name: [name] MRN: [id number] Date of Birth: October 05, 1950  Today's Date: 02/24/2015 OT Individual Time: 0930-1030 OT Individual Time Calculation (min): 60 min    Short Term Goals: Week 1:  OT Short Term Goal 1 (Week 1): Pt. will stand for grooming at sink with SBA OT Short Term Goal 2 (Week 1): Pt. will bathe at SBA level OT Short Term Goal 3 (Week 1): Pt will dress with SBA and sit to stand OT Short Term Goal 4 (Week 1): Pt will transfer to toilet with SBA OT Short Term Goal 5 (Week 1): Pt. will transfer to shower with SBA  Skilled Therapeutic Interventions/Progress Updates:    Pt seen for ADL bathing and dressing session. Pt in supine upon arrival, agreeable to tx, wife present. Pt transferred to EOB with VCs for technique. Pt dressed LB with assist to thread pants over L stump as pt voiced difficulty seeing where to thread pants seconds to decreased peripheral vision. Pt pulled up pants with VCs to utilize lateral leans to complete task. Pt transferred with min A squat pivot transfer to w/c. He completed grooming tasks with set-up at the sink and stood with steadying assist to complete oral care, keeping L UE on sink for stabilizer. Pt required increased encouragement to stand to complete functional tasks as pt unsure of his balance and fear of falling. Pt voiced need for toileting task. He completed stand pivot transfer to standard toilet with steadying assist and assist for controlled descent. Pt completed toileting task with supervision. Pt attempted to stand and transfer off toilet independently while OT was educating pt's wife on POC, pt assisted back to w/c with min A and educated regarding need for assist and fall risk, pt voiced understanding of needing to have staff present prior to transfer. Pt completed hand hygiene seated at the sink. Pt then self propelled w/c throughout unit 2x around nurses station with emphasis on UE  strengthening and w/c management. Pt fatigued following ~5 minutes of self propelling w/c, he required VCs throughout for awareness of environmental obstacles due to impaired vision and for efficient w/c propulsion movements. Pt returned to room and left sitting in w/c with all needs in reach.   Pt and caregiver educated regarding role of OT, POC, OT goals, and d/c planning.   Therapy Documentation Precautions:  Precautions Precautions: Fall Restrictions Weight Bearing Restrictions: Yes LLE Weight Bearing: Non weight bearing (LLE amputated below the knee) Other Position/Activity Restrictions: L LE Pain: Pain Assessment Pain Assessment: No/denies pain  See FIM for current functional status  Therapy/Group: Individual Therapy  [name], Valli Randol C 02/24/2015, 7:48 AM

## 2015-02-24 NOTE — Progress Notes (Signed)
Patient ID: [name], male   DOB: 06/27/1951, 64 y.o.   MRN: [id number]   Patient ID: [name], male   DOB: 06/27/1951, 64 y.o.   MRN: [id number]   02/24/15.   64 y.o. male with history of DM type 2 with retionopathy, s/p renal-pancreas transplant, PTSD, PVD with left foot infection and failed attempts at limb salvage. 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 Lajoyce Cornersuda. Post op limited by pain as well as weakness affecting mobility. PT evaluation done and patient admitted for CIR 02/22/15.  Minimal discomfort- good night.  No complaints this am  Past Medical History  Diagnosis Date  . Renal disorder   . Diabetes mellitus without complication   . CPAP (continuous positive airway pressure) dependence   . Sleep apnea   . Legally blind in left eye, as defined in [place]   . PTSD (post-traumatic stress disorder)   . PVD (peripheral vascular disease) 01/17/2015  . Hypertension   . Anxiety   . GERD (gastroesophageal reflux disease)     Patient Vitals for the past 24 hrs:  BP Temp Temp src Pulse Resp SpO2  02/24/15 0515 (!) 145/61 mmHg 98.1 F (36.7 C) Oral 90 16 95 %  02/23/15 2100 131/60 mmHg - - - - -  02/23/15 1500 130/66 mmHg 98.4 F (36.9 C) Oral 89 14 98 %  02/23/15 1017 (!) 129/48 mmHg - - - - -     Intake/Output Summary (Last 24 hours) at 02/24/15 0800 Last data filed at 02/24/15 0516  Gross per 24 hour  Intake    240 ml  Output    550 ml  Net   -310 ml   Lab Results  Component Value Date   HGBA1C 11.8* 01/13/2015    CBG (last 3)   Recent Labs  02/23/15 1721 02/23/15 2110 02/24/15 0651  GLUCAP 113* 151* 93       Physical Exam  Nursing note and vitals reviewed. Constitutional: He is oriented to person, place, and time. He appears well-developed and well-nourished.  HENT:  Head: Normocephalic and atraumatic.  Eyes: Conjunctivae are normal. Pupils are equal, round, and reactive to light.  Neck: Normal range of motion. Neck supple.   Cardiovascular: Normal rate and regular rhythm. Transmitted systolic and diastolic murmur  from AVF Respiratory: Effort normal and breath sounds normal. No respiratory distress. He has no wheezes.  GI: Soft. Bowel sounds are normal. He exhibits no distension. There is no tenderness.  Musculoskeletal:  L-BKA with coban dressing R arm AVF Neurological: He is alert and oriented to person, place, and time.  Skin: Skin is warm and dry. No erythema.     Medical Problem List and Plan: 1. Functional deficits secondary to L-BKA 2. DVT Prophylaxis/Anticoagulation: Pharmaceutical: Lovenox 3. Pain Management: Continue oxycodone prn.   4. Skin/Wound Care: Continue compressive dressing. Routine pressure relief measures.   5.  DM type 2: Will monitor BS with ac/hs checks and use SSI for elevated BS. Continue glucotrol and lantus as per home regiment.   6.  Pancrease/Renal transplant: On Prograf bid and Cellcept bid. 7. HTN: Will monitor BP every 8 hours. Continue Norvasc and minipress daily 8.  Post op anemia.  HCT down to 30.4

## 2015-02-24 NOTE — Evaluation (Signed)
Occupational Therapy Assessment and Plan  Patient Details  Name: [name] MRN: [id number] Date of Birth: [birth year]  OT Diagnosis: acute pain, blindness and low vision and pain in joint Rehab Potential: Rehab Potential (ACUTE ONLY): [place] ELOS: 10-14 days 10-14 days  Today's Date: 02/23/2015 OT Individual Time:  -   0945-1100  (75 min)       Problem List:  Patient Active Problem List   Diagnosis Date Noted  . Unilateral complete BKA 02/22/2015  . S/P BKA (below knee amputation) unilateral 02/20/2015  . Ischemic ulcer of toe   . Peripheral vascular disease   . PVD (peripheral vascular disease) 01/17/2015  . Cellulitis and abscess of foot 01/13/2015  . Cellulitis and abscess of foot excluding toe 01/13/2015  . Diabetes mellitus without complication 49/67/5916  . Leukocytosis 01/13/2015  . Diabetes 12/19/2007  . DYSLIPIDEMIA 12/19/2007  . ANEMIA 12/19/2007  . ANXIETY 12/19/2007  . Essential hypertension 12/19/2007  . TRANSIENT ISCHEMIC ATTACK 12/19/2007  . PERIPHERAL VASCULAR DISEASE 12/19/2007  . RENAL FAILURE 12/19/2007    Past Medical History:  Past Medical History  Diagnosis Date  . Renal disorder   . Diabetes mellitus without complication   . CPAP (continuous positive airway pressure) dependence   . Sleep apnea   . Legally blind in left eye, as defined in [place]   . PTSD (post-traumatic stress disorder)   . PVD (peripheral vascular disease) 01/17/2015  . Hypertension   . Anxiety   . GERD (gastroesophageal reflux disease)    Past Surgical History:  Past Surgical History  Procedure Laterality Date  . Combined kidney-pancreas transplant Left 2009  . Incision and drainage abscess Left 01/13/2015    Procedure: INCISION AND DRAINAGE OF LEFT FOOT FIRST RAY ABCESS;  Surgeon: Mcarthur Rossetti, MD;  Location: [place];  Service: Orthopedics;  Laterality: Left;  . Abdominal aortagram N/A 01/21/2015    Procedure: ABDOMINAL Maxcine Ham;  Surgeon: Angelia Mould,  MD;  Location[facility] C[facility] LAB;  Service: Cardiovascular;  Laterality: N/A;  . Lower extremity angiogram Left 01/21/2015    Procedure: LOWER EXTREMITY ANGIOGRAM;  Surgeon: Angelia Mould, MD;  Location: [facility] CATH LAB;  Service: Cardiovascular;  Laterality: Left;  . Amputation Left 01/23/2015    Procedure: FIRST RAY AMPUTATION/LEFT FOOT;  Surgeon: Newt Minion, MD;  Location: [place];  Service: Orthopedics;  Laterality: Left;  . Av fistula placement    . Colonoscopy    . Amputation Left 02/20/2015    Procedure: AMPUTATION BELOW KNEE;  Surgeon: Newt Minion, MD;  Location: [place];  Service: Orthopedics;  Laterality: Left;    Assessment & Plan Clinical Impression:[name] is a 64 y.o. male with history of DM type 2 with retionopathy, s/p renal-pancreas transplant, PTSD, PVD with left foot infection and failed attempts at limb salvage. 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 Sharol Given. Post op limited by pain as well as weakness affecting mobility. PT evaluation done yesterday and CIR recommended by MD and PT.   Patient transferred to CIR on 02/22/2015 .    Patient currently requires mod with basic self-care skills secondary to muscle weakness and decreased visual acuity.  Prior to hospitalization, patient could complete BADL and IADL with independent .  Patient will benefit from skilled intervention to increase independence with basic self-care skills and increase level of independence with iADL prior to discharge home with care partner.  Anticipate patient will require intermittent supervision and follow up home health.  OT - End of  Session Activity Tolerance: Tolerates 10 - 20 min activity with multiple rests Endurance Deficit: Yes Endurance Deficit Description:  (frequent rest breaks) OT Assessment Rehab Potential (ACUTE ONLY): Good Barriers to Discharge: Other (comment) (none noted) OT Plan OT Intensity: Minimum of 1-2 x/day, 45 to 90 minutes OT Frequency: 5 out of 7 days OT  Duration/Estimated Length of Stay: 10-14 days OT Treatment/Interventions: Balance/vestibular training;Community reintegration;Discharge planning;Disease mangement/prevention;DME/adaptive equipment instruction;Functional mobility training;Pain management;Patient/family education;Self Care/advanced ADL retraining;Skin care/wound managment;Therapeutic Activities;Therapeutic Exercise;UE/LE Strength taining/ROM;Visual/perceptual remediation/compensation;Wheelchair propulsion/positioning OT Recommendation Recommendations for Other Services:  (none) Patient destination: Home Follow Up Recommendations: Home health OT Equipment Recommended: To be determined   Skilled Therapeutic Intervention Brief Interview for Mental Status:    Memory:  Word Repetition:   "Sock"  "Blue"  "Bed"   3/3 correct   Orientation Level:    Year:  2016   Month:  May  Day of Week: Sat  Memory:  Recall: recalled 2/3 words after 5 mins; recalled last word with cues.       " Short Term Goals: Week 1:     Skilled Therapeutic Interventions/Progress Updates:    OT addressed mobility, sit to stand, education about pain relief for residual limb.  Pt. Transferred to wc to perform bathing and dressing.  Stood for 15 sec with minimal assist with RW.  Explained POC and ELOS.  Pt verbalized understanding.      OT Evaluation Precautions/Restrictions  Precautions Precautions: Fall Restrictions Weight Bearing Restrictions: Yes LLE Weight Bearing: Non weight bearing (LLE is amputated below knee) General   Vital Signs Therapy Vitals Temp: 98.1 F (36.7 C) Temp Source: Oral Pulse Rate: 90 Resp: 16 BP: (!) 145/61 mmHg Patient Position (if appropriate): Lying Oxygen Therapy SpO2: 95 % O2 Device: Not Delivered Pain Pain Assessment Pain Assessment: 0-10 Pain Score: 8  Pain Type: Phantom pain Pain Location: Leg Pain Orientation: Left Pain Descriptors / Indicators: Sharp Pain Frequency: Constant Pain  Intervention(s): Medication (See eMAR) Home Living/Prior Functioning Home Living Available Help at Discharge: Family, Available PRN/intermittently (step son and 3 children) Type of Home: House Home Access: Stairs to enter Technical brewer of Steps: 2 Entrance Stairs-Rails: None Home Layout: One level  Lives With: Spouse IADL History Current License: No Mode of Transportation: Car Occupation: On disability Leisure and Hobbies: visiting with friends,  Prior Function Level of Independence: Independent with basic ADLs  Able to Take Stairs?: Reciprically Driving: No Vocation: Retired ADL   Vision/Perception  Vision- Risk analyst: Impaired (comment) Perception Perception: Within Functional Limits  Cognition Overall Cognitive Status: Within Functional Limits for tasks assessed Arousal/Alertness: Awake/alert Orientation Level: Oriented X4 (SEE BIMS) Sensation Sensation Light Touch: Appears Intact Coordination Gross Motor Movements are Fluid and Coordinated: Yes Fine Motor Movements are Fluid and Coordinated: Yes Motor  Motor Motor: Within Functional Limits Mobility  Bed Mobility Bed Mobility: Rolling Right;Right Sidelying to Sit Rolling Right: 5: Set up Rolling Right Details: Tactile cues for initiation Rolling Left: 4: Min guard Transfers Sit to Stand: 4: Min assist Stand to Sit: 4: Min assist  Trunk/Postural Assessment  Cervical Assessment Cervical Assessment: Within Functional Limits Thoracic Assessment Thoracic Assessment: Within Functional Limits Lumbar Assessment Lumbar Assessment: Within Functional Limits Postural Control Postural Control: Deficits on evaluation Postural Limitations: decreased excursions and balance reactions  Balance Balance Balance Assessed: Yes Dynamic Sitting Balance Sitting balance - Comments:  (min assist with dynamic sitting with min cues ) Dynamic Standing Balance Dynamic Standing - Balance Activities: Other  (comment) (able to take 3  steps with RW and min assist) Extremity/Trunk Assessment RUE Assessment RUE Assessment: Within Functional Limits LUE Assessment LUE Assessment: Within Functional Limits  FIM:      Refer to Care Plan for Long Term Goals  Recommendations for other services: None  Discharge Criteria: Patient will be discharged from OT if patient refuses treatment 3 consecutive times without medical reason, if treatment goals not met, if there is a change in medical status, if patient makes no progress towards goals or if patient is discharged from hospital.  The above assessment, treatment plan, treatment alternatives and goals were discussed and mutually agreed upon: by patient  Lisa Roca 02/24/2015, 7:48 AM

## 2015-02-25 ENCOUNTER — Inpatient Hospital Stay (HOSPITAL_COMMUNITY): Payer: Medicare Other

## 2015-02-25 ENCOUNTER — Inpatient Hospital Stay (HOSPITAL_COMMUNITY): Payer: Medicare Other | Admitting: Occupational Therapy

## 2015-02-25 ENCOUNTER — Inpatient Hospital Stay (HOSPITAL_COMMUNITY): Payer: Medicare Other | Admitting: Physical Therapy

## 2015-02-25 DIAGNOSIS — G546 Phantom limb syndrome with pain: Secondary | ICD-10-CM

## 2015-02-25 DIAGNOSIS — S88112S Complete traumatic amputation at level between knee and ankle, left lower leg, sequela: Secondary | ICD-10-CM

## 2015-02-25 LAB — GLUCOSE, CAPILLARY
Glucose-Capillary: 135 mg/dL — ABNORMAL HIGH (ref 65–99)
Glucose-Capillary: 133 mg/dL — ABNORMAL HIGH (ref 65–99)
Glucose-Capillary: 127 mg/dL — ABNORMAL HIGH (ref 65–99)
Glucose-Capillary: 132 mg/dL — ABNORMAL HIGH (ref 65–99)

## 2015-02-25 MED ORDER — SORBITOL 70 % SOLN
30.0000 mL | Status: DC | PRN
  Administered 2015-02-25: 30 mL via ORAL
  Filled 2015-02-25: qty 30

## 2015-02-25 NOTE — IPOC Note (Addendum)
Overall Plan of Care [facility](IPOC) Patient Details Name: Ascencion DikeClarence Cleaver MRN: [id number] DOB: [birth year]  Admitting Diagnosis: L BKA  Hospital Problems: Active Problems:   Unilateral complete BKA     Functional Problem List: Nursing Endurance, Medication Management, Pain, Safety, Skin Integrity  PT Balance, Endurance, Safety, Pain  OT Balance, Endurance, Motor, Pain, Safety, Vision  SLP    TR Activity tolerance, functional mobility, balance, safety, pain, anxiety/stress, vision       Basic ADL's: OT Grooming, Bathing, Dressing, Toileting     Advanced  ADL's: OT Simple Meal Preparation     Transfers: PT Bed Mobility, Bed to Chair  OT Toilet, Tub/Shower     Locomotion: PT Ambulation, Stairs, Wheelchair Mobility     Additional Impairments: OT None  SLP        TR      Anticipated Outcomes Item Anticipated Outcome  Self Feeding independent  Swallowing      Basic self-care  mod I  Toileting  mod I   Bathroom Transfers mod I  Bowel/Bladder  continent B & B  Transfers  Modified independance  Locomotion  Modified independance  Communication     Cognition     Pain  Pain level < 2  Safety/Judgment  No falls while on rehab   Therapy Plan: PT Intensity: Minimum of 1-2 x/day ,45 to 90 minutes PT Frequency: 5 out of 7 days PT Duration Estimated Length of Stay: 10-14 days OT Intensity: Minimum of 1-2 x/day, 45 to 90 minutes OT Frequency: 5 out of 7 days OT Duration/Estimated Length of Stay: 10-14 days  TR Duration/ELOS:  10 Days TR Frequency:  Min 1 time per week >20 minutes      Team Interventions: Nursing Interventions Patient/Family Education, Medication Management, Skin Care/Wound Management, Pain Management, Disease Management/Prevention, Discharge Planning  PT interventions Ambulation/gait training, Discharge planning, Functional mobility training, Therapeutic Activities, UE/LE Strength taining/ROM, Therapeutic Exercise, UE/LE Coordination activities,  Neuromuscular re-education  OT Interventions Balance/vestibular training, Community reintegration, Discharge planning, Disease mangement/prevention, DME/adaptive equipment instruction, Functional mobility training, Pain management, Patient/family education, Self Care/advanced ADL retraining, Skin care/wound managment, Therapeutic Activities, Therapeutic Exercise, UE/LE Strength taining/ROM, Visual/perceptual remediation/compensation, Wheelchair propulsion/positioning  SLP Interventions    TR Interventions Recreation/leisure participation, Balance/Vestibular training, functional mobility, therapeutic activities, UE/LE strength/coordination, w/c mobility, community reintegration, pt/family education, adaptive equipment instruction/use, discharge planning, psychosocial support  SW/CM Interventions Discharge Planning, Facilities managersychosocial Support, Patient/Family Education    Team Discharge Planning: Destination: PT-Home ,OT- Home , SLP-  Projected Follow-up: PT-Home health PT (Maybe SNF due to questionable home situation), OT-  Home health OT, SLP-  Projected Equipment Needs: PT-To be determined, OT- To be determined, SLP-  Equipment Details: PT- , OT-  Patient/family involved in discharge planning: PT- Patient,  OT-Patient, SLP-   MD ELOS: 7-10d Medical Rehab Prognosis:  Good Assessment: 64 y.o. male with history of DM type 2 with retionopathy, s/p renal-pancreas transplant, PTSD, PVD with left foot infection and failed attempts at limb salvage. 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 Lajoyce Cornersuda. Post op limited by pain as well as weakness affecting mobility   Now requiring 24/7 Rehab RN,MD, as well as CIR level PT, OT.  Treatment team will focus on ADLs and mobility with goals set at Mod I See Team Conference Notes for weekly updates to the plan of care

## 2015-02-25 NOTE — Plan of Care (Signed)
Problem: RH Balance Goal: LTG Patient will maintain dynamic standing balance (PT) LTG: Patient will maintain dynamic standing balance with assistance during mobility activities (PT)  Downgraded 02/25/15 due to impaired cognition  Problem: RH Bed to Chair Transfers Goal: LTG Patient will perform bed/chair transfers w/assist (PT) LTG: Patient will perform bed/chair transfers with assistance, with/without cues (PT).  Downgraded 02/25/15 due to impaired cognition     Problem: RH Ambulation Goal: LTG Patient will ambulate in controlled environment (PT) LTG: Patient will ambulate in a controlled environment, # of feet with assistance (PT).  Downgraded 02/25/15 due to impaired cognition     Problem: RH Stairs Goal: LTG Patient will ambulate up and down stairs w/assist (PT) LTG: Patient will ambulate up and down # of stairs with assistance (PT)  Downgraded 02/25/15 due to impaired balance and cognition     Problem: RH Ambulation Goal: LTG Patient will ambulate in home environment (PT) LTG: Patient will ambulate in home environment, # of feet with assistance (PT). Goal added 02/25/15  Problem: RH Wheelchair Mobility Goal: LTG Patient will propel w/c in controlled environment (PT) LTG: Patient will propel wheelchair in controlled environment, # of feet with assist (PT) Goal added 02/25/15 Goal: LTG Patient will propel w/c in community environment (PT) LTG: Patient will propel wheelchair in community environment, # of feet with assist (PT) Goal added 02/25/15  Problem: RH Memory Goal: LTG Patient demonstrate ability for day to day recall (PT) LTG: Patient will demonstrate ability for day to day recall/carryover during mobility activities with assist (PT) Goal added 02/25/15  Problem: RH Other (Specify) Goal: RH LTG Other (Specify) Goal added 02/25/15

## 2015-02-25 NOTE — Progress Notes (Signed)
Recreational Therapy Assessment and Plan  Patient Details  Name: [name] MRN: [id number] Date of Birth: 1951-07-23 Today's Date: 02/25/2015  Rehab Potential: Good ELOS: 10 days   Assessment Clinical Impression:  Problem List:  Patient Active Problem List   Diagnosis Date Noted  . Unilateral complete BKA 02/22/2015  . S/P BKA (below knee amputation) unilateral 02/20/2015  . Ischemic ulcer of toe   . Peripheral vascular disease   . PVD (peripheral vascular disease) 01/17/2015  . Cellulitis and abscess of foot 01/13/2015  . Cellulitis and abscess of foot excluding toe 01/13/2015  . Diabetes mellitus without complication 27/78/2423  . Leukocytosis 01/13/2015  . Diabetes 12/19/2007  . DYSLIPIDEMIA 12/19/2007  . ANEMIA 12/19/2007  . ANXIETY 12/19/2007  . Essential hypertension 12/19/2007  . TRANSIENT ISCHEMIC ATTACK 12/19/2007  . PERIPHERAL VASCULAR DISEASE 12/19/2007  . RENAL FAILURE 12/19/2007    Past Medical History:  Past Medical History  Diagnosis Date  . Renal disorder   . Diabetes mellitus without complication   . CPAP (continuous positive airway pressure) dependence   . Sleep apnea   . Legally blind in left eye, as defined in [place]   . PTSD (post-traumatic stress disorder)   . PVD (peripheral vascular disease) 01/17/2015  . Hypertension   . Anxiety   . GERD (gastroesophageal reflux disease)    Past Surgical History:  Past Surgical History  Procedure Laterality Date  . Combined kidney-pancreas transplant Left 2009  . Incision and drainage abscess Left 01/13/2015    Procedure: INCISION AND DRAINAGE OF LEFT FOOT FIRST RAY ABCESS; Surgeon: Mcarthur Rossetti, MD; Location: [place]; Service: Orthopedics; Laterality: Left;  . Abdominal aortagram N/A 01/21/2015    Procedure: ABDOMINAL Maxcine Ham; Surgeon: Angelia Mould, MD; Location: [facility] CATH  LAB; Service: Cardiovascular; Laterality: N/A;  . Lower extremity angiogram Left 01/21/2015    Procedure: LOWER EXTREMITY ANGIOGRAM; Surgeon: Angelia Mould, MD; Location[facility] CATH LAB; Service: Cardiovascular; Laterality: Left;  . Amputation Left 01/23/2015    Procedure: FIRST RAY AMPUTATION/LEFT FOOT; Surgeon: Newt Minion, MD; Location: [place]; Service: Orthopedics; Laterality: Left;  . Av fistula placement    . Colonoscopy    . Amputation Left 02/20/2015    Procedure: AMPUTATION BELOW KNEE; Surgeon: Newt Minion, MD; Location: [place]; Service: Orthopedics; Laterality: Left;    Assessment & Plan Clinical Impression:[name] is a 64 y.o. male with history of DM type 2 with retionopathy, s/p renal-pancreas transplant, PTSD, PVD with left foot infection and failed attempts at limb salvage. 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 Sharol Given. Post op limited by pain as well as weakness affecting mobility. PT evaluation done yesterday and CIR recommended by MD and PT. Patient transferred to CIR on 02/22/2015 .     Pt presents with decreased activity tolerance, decreased functional mobility, decreased balance, decreased safety, decreased vision increased anxiety & pain Limiting pt's independence with leisure/community pursuits.   Leisure History/Participation Premorbid leisure interest/current participation: Community - Building control surveyor - Travel (Comment) Other Leisure Interests: Television;Movies;Cooking/Baking Awareness of Community Resources: Good-identify 3 post discharge leisure resources Psychosocial / Spiritual Social interaction - Mood/Behavior: Cooperative Academic librarian Appropriate for Education?: Yes Recreational Therapy Orientation Orientation -Reviewed with patient: Available activity resources Strengths/Weaknesses Patient Strengths/Abilities: Willingness to participate;Active premorbidly Patient weaknesses:  Physical limitations TR Patient demonstrates impairments in the following area(s): Edema;Endurance;Motor;Pain;Safety;Skin Integrity  Plan Rec Therapy Plan Is patient appropriate for Therapeutic Recreation?: Yes Rehab Potential: Good Treatment times per week: Min 1 time per week >20  minutes Estimated Length of Stay: 10 days TR Treatment/Interventions: Adaptive equipment instruction;1:1 session;Balance/vestibular training;Functional mobility training;Patient/family education;Recreation/leisure participation;Therapeutic exercise;Therapeutic activities;UE/LE Coordination activities;Wheelchair propulsion/positioning Recommendations for other services: Neuropsych  Recommendations for other services: Neuropsych  Discharge Criteria: Patient will be discharged from TR if patient refuses treatment 3 consecutive times without medical reason.  If treatment goals not met, if there is a change in medical status, if patient makes no progress towards goals or if patient is discharged from hospital.  The above assessment, treatment plan, treatment alternatives and goals were discussed and mutually agreed upon: by patient  [place] 02/25/2015, 3:25 PM

## 2015-02-25 NOTE — Progress Notes (Signed)
64 y.o. male with history of DM type 2 with retinopathy, s/p renal-pancreas transplant, PTSD, PVD with left foot infection and failed attempts at limb salvage. 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 Sharol Given. 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 Is able to stand with a walker with PT standby assistance  Objective: Vital Signs: Blood pressure 134/56, pulse 84, temperature 99.2 F (37.3 C), temperature source Oral, resp. rate 18, height $RemoveBe'5\' 10"'znbzMgRMw$  (1.778 m), weight 76.204 kg (168 lb), SpO2 100 %. No results found. Results for orders placed or performed during the hospital encounter of 02/22/15 (from the past 72 hour(s))  Glucose, capillary     Status: Abnormal   Collection Time: 02/22/15  9:13 PM  Result Value Ref Range   Glucose-Capillary 116 (H) 65 - 99 mg/dL  CBC WITH DIFFERENTIAL     Status: Abnormal   Collection Time: 02/23/15  5:15 AM  Result Value Ref Range   WBC 7.9 4.0 - 10.5 K/uL   RBC 3.79 (L) 4.22 - 5.81 MIL/uL   Hemoglobin 9.4 (L) 13.0 - 17.0 g/dL   HCT 30.4 (L) 39.0 - 52.0 %   MCV 80.2 78.0 - 100.0 fL   MCH 24.8 (L) 26.0 - 34.0 pg   MCHC 30.9 30.0 - 36.0 g/dL   RDW 14.1 11.5 - 15.5 %   Platelets 212 150 - 400 K/uL   Neutrophils Relative % 72 43 - 77 %   Neutro Abs 5.7 1.7 - 7.7 K/uL   Lymphocytes Relative 21 12 - 46 %   Lymphs Abs 1.7 0.7 - 4.0 K/uL   Monocytes Relative 6 3 - 12 %   Monocytes Absolute 0.5 0.1 - 1.0 K/uL   Eosinophils Relative 1 0 - 5 %   Eosinophils Absolute 0.1 0.0 - 0.7 K/uL   Basophils Relative 0 0 - 1 %   Basophils Absolute 0.0 0.0 - 0.1 K/uL  Comprehensive metabolic panel     Status: Abnormal   Collection Time: 02/23/15  5:15 AM  Result Value Ref Range   Sodium 137 135 - 145 mmol/L   Potassium 4.6 3.5 - 5.1 mmol/L   Chloride 102 101 - 111 mmol/L   CO2 28 22 - 32 mmol/L   Glucose, Bld 97 65 - 99 mg/dL    BUN 12 6 - 20 mg/dL   Creatinine, Ser 0.91 0.61 - 1.24 mg/dL   Calcium 9.3 8.9 - 10.3 mg/dL   Total Protein 7.1 6.5 - 8.1 g/dL   Albumin 3.0 (L) 3.5 - 5.0 g/dL   AST 13 (L) 15 - 41 U/L   ALT 10 (L) 17 - 63 U/L   Alkaline Phosphatase 71 38 - 126 U/L   Total Bilirubin 0.6 0.3 - 1.2 mg/dL   GFR calc non Af Amer >60 >60 mL/min   GFR calc Af Amer >60 >60 mL/min    Comment: (NOTE) The eGFR has been calculated using the CKD EPI equation. This calculation has not been validated in all clinical situations. eGFR's persistently <60 mL/min signify possible Chronic Kidney Disease.    Anion gap 7 5 - 15  Glucose, capillary     Status: Abnormal   Collection Time: 02/23/15  6:50 AM  Result Value Ref Range   Glucose-Capillary 107 (H) 65 - 99 mg/dL  Glucose, capillary     Status: Abnormal   Collection Time: 02/23/15 11:55 AM  Result Value Ref Range   Glucose-Capillary 137 (H) 65 - 99 mg/dL   Comment 1 Notify RN   Glucose, capillary     Status: Abnormal   Collection Time: 02/23/15  5:21 PM  Result Value Ref Range   Glucose-Capillary 113 (H) 65 - 99 mg/dL   Comment 1 Notify RN   Glucose, capillary     Status: Abnormal   Collection Time: 02/23/15  9:10 PM  Result Value Ref Range   Glucose-Capillary 151 (H) 65 - 99 mg/dL  Glucose, capillary     Status: None   Collection Time: 02/24/15  6:51 AM  Result Value Ref Range   Glucose-Capillary 93 65 - 99 mg/dL  Glucose, capillary     Status: Abnormal   Collection Time: 02/24/15 11:37 AM  Result Value Ref Range   Glucose-Capillary 188 (H) 65 - 99 mg/dL  Glucose, capillary     Status: Abnormal   Collection Time: 02/24/15  4:49 PM  Result Value Ref Range   Glucose-Capillary 107 (H) 65 - 99 mg/dL  Glucose, capillary     Status: Abnormal   Collection Time: 02/24/15  8:59 PM  Result Value Ref Range   Glucose-Capillary 133 (H) 65 - 99 mg/dL   Comment 1 Notify RN   Glucose, capillary     Status: Abnormal   Collection Time: 02/25/15  6:47 AM   Result Value Ref Range   Glucose-Capillary 135 (H) 65 - 99 mg/dL   Comment 1 Notify RN      HEENT: normal Cardio: RRR Resp: CTA B/L and unlabored GI: BS positive and nontender nondistended Extremity:  Pulses positive and No Edema Skin:   Intact Neuro: Alert/Oriented Musc/Skel:  Other Left BKA with Coban wrap Gen. no acute distress   Assessment/Plan: 1. Functional deficits secondary to  L-BKA which require 3+ hours per day of interdisciplinary therapy in a comprehensive inpatient rehab setting. Physiatrist is providing close team supervision and 24 hour management of active medical problems listed below. Physiatrist and rehab team continue to assess barriers to discharge/monitor patient progress toward functional and medical goals. FIM: FIM - Bathing Bathing Steps Patient Completed: Chest, Right Arm, Left Arm, Abdomen, Front perineal area, Buttocks, Right upper leg, Left upper leg, Right lower leg (including foot) Bathing: 4: Min-Patient completes 8-9 58f 10 parts or 75+ percent  FIM - Upper Body Dressing/Undressing Upper body dressing/undressing steps patient completed: Thread/unthread right sleeve of pullover shirt/dresss, Thread/unthread left sleeve of pullover shirt/dress, Put head through opening of pull over shirt/dress, Pull shirt over trunk Upper body dressing/undressing: 4: Min-Patient completed 75 plus % of tasks FIM - Lower Body Dressing/Undressing Lower body dressing/undressing steps patient completed: Thread/unthread right pants leg, Thread/unthread left pants leg, Pull pants up/down, Fasten/unfasten right shoe Lower body dressing/undressing: 5: Supervision: Safety issues/verbal cues  FIM - Toileting Toileting steps completed by patient: Adjust clothing prior to toileting, Adjust clothing after toileting, Performs perineal hygiene Toileting Assistive Devices: Grab bar or rail for support Toileting: 5: Supervision: Safety issues/verbal cues  FIM - Glass blower/designer Devices: Grab bars Toilet Transfers: 3-To toilet/BSC: Mod A (lift or lower assist), 4-From toilet/BSC: Min A (steadying Pt. > 75%)  FIM - Bed/Chair Transfer Bed/Chair Transfer Assistive Devices: Arm rests Bed/Chair Transfer: 5: Supine > Sit: Supervision (verbal cues/safety issues), 4: Chair or W/C > Bed: Min A (steadying Pt. > 75%)  FIM - Locomotion: Wheelchair Distance: 120 Locomotion: Wheelchair: 2: Travels 50 - 149 ft with minimal assistance (Pt.>75%) FIM - Locomotion:  Ambulation Locomotion: Ambulation Assistive Devices: Walker - Rolling Ambulation/Gait Assistance: 4: Min assist Locomotion: Ambulation: 1: Travels less than 50 ft with minimal assistance (Pt.>75%) (24)  Comprehension Comprehension Mode: Auditory Comprehension: 5-Understands basic 90% of the time/requires cueing < 10% of the time  Expression Expression Mode: Verbal Expression: 5-Expresses basic needs/ideas: With no assist  Social Interaction Social Interaction: 7-Interacts appropriately with others - No medications needed.  Problem Solving Problem Solving: 5-Solves basic 90% of the time/requires cueing < 10% of the time  Memory Memory: 7-Complete Independence: No helper  Medical Problem List and Plan: 1. Functional deficits secondary to L-BKA 2. DVT Prophylaxis/Anticoagulation: Pharmaceutical: Lovenox 3. Pain Management: Continue oxycodone prn. phantom pain, gabapentin 4. Mood: Continue Zoloft. LCSW to follow for evaluation and support.  5. Neuropsych: This patient is capable of making decisions on his own behalf. 6. Skin/Wound Care: Continue compressive dressing. Routine pressure relief measures.  7. Fluids/Electrolytes/Nutrition: Monitor I/O. Off supplements if intake poor. Check lytes in am. 8. DM type 2: Will monitor BS with ac/hs checks and use SSI for elevated BS. Continue glucotrol and lantus as per home regiment.  9. Dehydration: Push po fluids. Check  labs in am.  10. Pancrease/Renal transplant: On Prograf bid and Cellcept bid. 11. HTN: Will monitor BP every 8 hours. Continue Norvasc and minipress daily LOS (Days) 3 A FACE TO FACE EVALUATION WAS PERFORMED  Jenine Krisher E 02/25/2015, 10:14 AM

## 2015-02-25 NOTE — Progress Notes (Signed)
Physical Therapy Session Note  Patient Details  Name: [name] MRN: [id number] Date of Birth: 1951-04-18  Today's Date: 02/25/2015 PT Individual Time: [id number] PT Individual Time Calculation (min): 43 min   Short Term Goals: Week 1:  PT Short Term Goal 1 (Week 1): Patient wil decrease L Knee flexion contracture to -15 of extension. PT Short Term Goal 2 (Week 1): Patient will increase ability to transfer sit to stand with Supervison PT Short Term Goal 3 (Week 1): Patient will increase activity tolerance to be able to stand for up to 10 min with activities PT Short Term Goal 4 (Week 1): Patient will be able to ambulate with FWW on a distance of 60 feet with Supervision PT Short Term Goal 5 (Week 1): Patient will be able to [facility] and down 2 steps with min A  Skilled Therapeutic Interventions/Progress Updates:   Pt received in w/c with recreation therapist.  Assessed pt memory of therapy sessions from weekend but pt unable to recall activities performed in therapy.  Discussed D/C date/ELOS, home set up, equipment and assistance available at D/C. Pt already owns a RW but would like to order his w/c through the [facility] so he can get it for free.  Pt measured for w/c specifications.  Pt also reports he has one platform step to his porch and one step from porch into house.  Pt reports his wife and step son can be present to supervise him but he and his step son have a very tense/violent relationship.  Pt also reports that after two weeks he may be moving to his sister's secondary to the tension in his home between him, his wife and step son.  Will continue to discuss with pt and team.  Pt agreeable to practice one step negotiation.  Performed w/c mobility x >150' on unit with supervision but intermittent cues for obstacle negotiation due to limited vision.  Pt able to perform w/c parts management and set up with supervision and verbal cues for sequence.  Demonstrate to pt how to hop forwards or  backwards onto platform as well as how to bump w/c onto platform.  Pt return demonstrated one step negotiation forwards with RW and +2 for safety (therapist provided mod A during hop) and backwards x 3 reps with min-mod A.  Pt reports first step onto porch is too high (8") to hop and family will have to bump him but he could negotiate the one step from porch to house.  Will need to practice with family.  Also performed simulated mini van transfer with pt performing stand pivot with RW min A with verbal cues for safe hand placement during sit <> stand.  Returned to room and pt transferred w/c > recliner stand pivot with RW and min A.  Pt left with all items within reach.   Goals downgraded to supervision due to impaired cognition and impaired vision for safety.      Therapy Documentation Precautions:  Precautions Precautions: Fall Restrictions Weight Bearing Restrictions: Yes LLE Weight Bearing: Non weight bearing Other Position/Activity Restrictions: L LE Vital Signs: Therapy Vitals Temp: 97.5 F (36.4 C) Temp Source: Oral Pulse Rate: 91 Resp: 18 BP: 135/70 mmHg Patient Position (if appropriate): Sitting Oxygen Therapy SpO2: 95 % O2 Device: Not Delivered Pain:  No c/o pain Locomotion : Ambulation Ambulation/Gait Assistance: 4: Min assist Wheelchair Mobility Distance: 150  See FIM for current functional status  Therapy/Group: Individual Therapy  Edman CircleHall, Erabella Kuipers Faucette 02/25/2015, 4:19 PM

## 2015-02-25 NOTE — Progress Notes (Signed)
Occupational Therapy Session Note  Patient Details  Name: [name] MRN: [id number] Date of Birth: Jul 04, 1951  Today's Date: 02/25/2015 OT Individual Time: 1100-1200 OT Individual Time Calculation (min): 60 min    Short Term Goals: Week 1:  OT Short Term Goal 1 (Week 1): Pt. will stand for grooming at sink with SBA OT Short Term Goal 2 (Week 1): Pt. will bathe at SBA level OT Short Term Goal 3 (Week 1): Pt will dress with SBA and sit to stand OT Short Term Goal 4 (Week 1): Pt will transfer to toilet with SBA OT Short Term Goal 5 (Week 1): Pt. will transfer to shower with SBA  Skilled Therapeutic Interventions/Progress Updates:    Pt seen for OT session focusing on functional transfers and functional standing balance. Pt in w/c upon arrival, agreeable to tx. Pt self propelled w/c to ADL apartment with supervision and completed functional transfers on/off standard bed and completed simulated tub transfer on bench with VCs for technique and close supervision from w/c via squat pivot transfer. Pt then taken to therapy gym where he transferred onto mat with supervision. Education provided for w/c management parts with emphasis on setting w/c in prep for transfers. Pt then completed sitting and standing balance tasks sitting on EOM then progressing to standing with CGA. Pt completed dynamic sitting activity supervision- mod I tossing ball. In standing, pt tossed horseshoes, requiring to weight shift to obtain horse shoes. Pt unable to maintain dynamic standing balance without 1UE supported on walker during functional task, likely due to pt's fear of falling and feeling off balance. Pt returned to w/c at end of session with supervision, he self propelled w/c back to room and left with all needs in reach.  Pt provided education regarding w/c management, DME, energy conservation, and d/c planning.  Therapy Documentation Precautions:  Precautions Precautions: Fall Restrictions Weight Bearing  Restrictions: Yes LLE Weight Bearing: Non weight bearing Other Position/Activity Restrictions: L LE Pain: Pain Assessment Pain Assessment: No/denies pain  See FIM for current functional status  Therapy/Group: Individual Therapy  [name] C 02/25/2015, 7:24 AM

## 2015-02-25 NOTE — Plan of Care (Signed)
Problem: RH PAIN MANAGEMENT Goal: RH STG PAIN MANAGED AT OR BELOW PT'S PAIN GOAL 2/10  Outcome: Not Progressing Rates pain 6/10

## 2015-02-25 NOTE — Progress Notes (Signed)
Occupational Therapy Session Note  Patient Details  Name: [name] MRN: [id number] Date of Birth: 18-Apr-1951  Today's Date: 02/25/2015 OT Individual Time: 0930-1100 OT Individual Time Calculation (min): 90 min   Short Term Goals: Week 1:  OT Short Term Goal 1 (Week 1): Pt. will stand for grooming at sink with SBA OT Short Term Goal 2 (Week 1): Pt. will bathe at SBA level OT Short Term Goal 3 (Week 1): Pt will dress with SBA and sit to stand OT Short Term Goal 4 (Week 1): Pt will transfer to toilet with SBA OT Short Term Goal 5 (Week 1): Pt. will transfer to shower with SBA  Skilled Therapeutic Interventions/Progress Updates: ADL-retraining with focus on shower level B & D, dynamic standing balance, and therex (Sci-FIt) to improve general endurance.    Pt received seated at EOB awaiting therapist and receptive for bathing.   With re-ed on precautions, pt ambulated from bed to shower chair with steadying d/t slight LOB 2 times during mobility.   Pt required min vc to problem-solve with positioning at shower chair but then progressed through bathing unassisted.   Pt transferred to w/c to dress at bed and sink with steadying assist to stand and pull up his pants d/t LOB.    Pt groomed standing at sink for 2 minutes to brush his teeth and recovered to w/c for escort to gym.    Pt was educated on benefit of UE ergometry to improve endurance and he sustained 4 minutes at level 5 on Sci-Fit.    Pt terminated 10 min session early d/t fatigue and SOB; HR was 100 bpm with good recovery within 5 minutes.  Pt was escorted by to his room and was greeted by a friend Environmental manager(guest).   Call light placed within reach.     Therapy Documentation Precautions:  Precautions Precautions: Fall Restrictions Weight Bearing Restrictions: Yes LLE Weight Bearing: Non weight bearing Other Position/Activity Restrictions: L LE    Pain: Pain Assessment Pain Assessment: 0-10 Pain Score: 0-No pain    See FIM for  current functional status  Therapy/Group: Individual Therapy  Annina Piotrowski 02/25/2015, 11:10 AM

## 2015-02-25 NOTE — Plan of Care (Signed)
Problem: RH BOWEL ELIMINATION Goal: RH STG MANAGE BOWEL WITH ASSISTANCE STG Manage Bowel with supervision .  Outcome: Not Progressing No BM since 02/22/15. Sorbital given

## 2015-02-26 ENCOUNTER — Inpatient Hospital Stay (HOSPITAL_COMMUNITY): Payer: Medicare Other | Admitting: Occupational Therapy

## 2015-02-26 ENCOUNTER — Inpatient Hospital Stay (HOSPITAL_COMMUNITY): Payer: Medicare Other

## 2015-02-26 ENCOUNTER — Ambulatory Visit (HOSPITAL_COMMUNITY): Payer: Medicare Other

## 2015-02-26 DIAGNOSIS — S88119D Complete traumatic amputation at level between knee and ankle, unspecified lower leg, subsequent encounter: Secondary | ICD-10-CM

## 2015-02-26 LAB — GLUCOSE, CAPILLARY
Glucose-Capillary: 163 mg/dL — ABNORMAL HIGH (ref 65–99)
Glucose-Capillary: 101 mg/dL — ABNORMAL HIGH (ref 65–99)
Glucose-Capillary: 94 mg/dL (ref 65–99)
Glucose-Capillary: 143 mg/dL — ABNORMAL HIGH (ref 65–99)

## 2015-02-26 MED ORDER — GABAPENTIN 300 MG PO CAPS
300.0000 mg | ORAL_CAPSULE | Freq: Two times a day (BID) | ORAL | Status: DC
  Administered 2015-02-26 – 2015-03-05 (×14): 300 mg via ORAL
  Filled 2015-02-26 (×17): qty 1

## 2015-02-26 MED ORDER — GABAPENTIN 600 MG PO TABS
300.0000 mg | ORAL_TABLET | Freq: Two times a day (BID) | ORAL | Status: DC
  Filled 2015-02-26 (×2): qty 0.5

## 2015-02-26 NOTE — Progress Notes (Signed)
Social Work Patient ID: [name] DikeClarence [name], male   DOB: [birth year], 64 y.o.   MRN: [id number]   CSW attempted to complete assessment with pt while he was on a break from therapy, but pt had visitors in the room, so he asked that CSW return at another time.  CSW reviewed pt's chart and noted some concern about pt's home situation as pt had reported to therapists.  CSW told pt of team conference and that CSW will visit with pt tomorrow and update him on team conference discussion/d/c date.

## 2015-02-26 NOTE — Progress Notes (Signed)
Occupational Therapy Session Note  Patient Details  Name: [name] MRN: [id number] Date of Birth: 02/13/1951  Today's Date: 02/26/2015 OT Individual Time: 0930-1100 OT Individual Time Calculation (min): 90 min    Short Term Goals: Week 1:  OT Short Term Goal 1 (Week 1): Pt. will stand for grooming at sink with SBA OT Short Term Goal 2 (Week 1): Pt. will bathe at SBA level OT Short Term Goal 3 (Week 1): Pt will dress with SBA and sit to stand OT Short Term Goal 4 (Week 1): Pt will transfer to toilet with SBA OT Short Term Goal 5 (Week 1): Pt. will transfer to shower with SBA  Skilled Therapeutic Interventions/Progress Updates: ADL-retraining with focus on functional mobility using RW, dynamic sitting/standing balance, energy conservation, and adapted bathing/dressing skills.  Pt received supine in bed reporting unusual fatigue, not related to sleep, diet or pain.   With extra time, pt identified family stressors as likely cause for low energy.   Pt then proceeded through BADL session with overall steadying assist while standing and ambulating with min vc for safety.    No LOB noted during this session or report of pain however pt continued to express frustration with family living situation but declined need for resources or intervention from counseling suggested through referrals to psychology or social work services.     Pt completed bathing seated, dressed at sink, standing to pull up his underwear and pants with min guard assist, and groomed seated this session with min assist for thoroughness during shaving d/t poor vision.     Therapy Documentation Precautions:  Precautions Precautions: Fall Restrictions Weight Bearing Restrictions: Yes LLE Weight Bearing: Non weight bearing Other Position/Activity Restrictions: L LE  Vital Signs: Therapy Vitals Pulse Rate: 85 BP: (!) 111/56 mmHg Patient Position (if appropriate): Sitting  Pain: Pain Assessment Pain Score: 2   See FIM  for current functional status  Therapy/Group: Individual Therapy  [name] 02/26/2015, 12:31 PM

## 2015-02-26 NOTE — Progress Notes (Signed)
Physical Therapy Session Note  Patient Details  Name: [name] MRN: [id number] Date of Birth: [birth year]  Today's Date: 02/26/2015 PT Individual Time: 1100-1200 PT Individual Time Calculation (min): 60 min   Short Term Goals: Week 1:  PT Short Term Goal 1 (Week 1): Patient wil decrease L Knee flexion contracture to -15 of extension. PT Short Term Goal 2 (Week 1): Patient will increase ability to transfer sit to stand with Supervison PT Short Term Goal 3 (Week 1): Patient will increase activity tolerance to be able to stand for up to 10 min with activities PT Short Term Goal 4 (Week 1): Patient will be able to ambulate with FWW on a distance of 60 feet with Supervision PT Short Term Goal 5 (Week 1): Patient will be able to [facility] and down 2 steps with min A  Skilled Therapeutic Interventions/Progress Updates:  Patient sitting in wheelchair upon entering room. Patient propelled wheelchair 150+ feet to and from gym with supervision. Patient ambulated 80 - 100 feet x 4 with close supervision on level tile and carpeted, home-like surfaces. Patient had 1 LOB that he was able to regain himself. Patient performed car transfer with supervision. Patient supine <> sit without assist and performed LE/amputee exercises including heel slides (hip and knee flexion), hip abduction and SAQ's in supine x 15 reps; hip abduction and hip extension in right side lying x 15 reps; LAQ's in sitting x 15 reps. Patient with decreased left knee flexion - contract relax to quads to get flexion to ~ 80 degrees. Patient hopped up and down 8 inch step x 3 backwards using rolling walker and min steady assist to simulate access to home. Patient left in room in wheelchair with quick release belt in place and all items in reach.  Therapy Documentation Precautions:  Precautions Precautions: Fall Restrictions Weight Bearing Restrictions: Yes LLE Weight Bearing: Non weight bearing Other Position/Activity Restrictions: L  LE  Pain: Pain Assessment Pain Assessment: 0-10 Pain Score: 1  Pain Type: Phantom pain Pain Location: Leg Pain Orientation: Left  Locomotion : Ambulation Ambulation/Gait Assistance: 5: Supervision Wheelchair Mobility Distance: 150   See FIM for current functional status  Therapy/Group: Individual Therapy  Alma FriendlyWindsor, Nelline Lio M 02/26/2015, 2:33 PM

## 2015-02-26 NOTE — Progress Notes (Signed)
64 y.o. male with history of DM type 2 with retinopathy, s/p renal-pancreas transplant, PTSD, PVD with left foot infection and failed attempts at limb salvage. 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 Sharol Given. Post op limited by pain as well as weakness affecting mobility Subjective/Complaints: Phantom limb pain slightly improved. The patient has just finished showering with OT. Needs some help with drying off. We've some help with standing  Objective: Vital Signs: Blood pressure 111/56, pulse 85, temperature 98.8 F (37.1 C), temperature source Oral, resp. rate 18, height $RemoveBe'5\' 10"'IBsDCNgzN$  (1.778 m), weight 76.204 kg (168 lb), SpO2 98 %. No results found. Results for orders placed or performed during the hospital encounter of 02/22/15 (from the past 72 hour(s))  Glucose, capillary     Status: Abnormal   Collection Time: 02/23/15  5:21 PM  Result Value Ref Range   Glucose-Capillary 113 (H) 65 - 99 mg/dL   Comment 1 Notify RN   Glucose, capillary     Status: Abnormal   Collection Time: 02/23/15  9:10 PM  Result Value Ref Range   Glucose-Capillary 151 (H) 65 - 99 mg/dL  Glucose, capillary     Status: None   Collection Time: 02/24/15  6:51 AM  Result Value Ref Range   Glucose-Capillary 93 65 - 99 mg/dL  Glucose, capillary     Status: Abnormal   Collection Time: 02/24/15 11:37 AM  Result Value Ref Range   Glucose-Capillary 188 (H) 65 - 99 mg/dL  Glucose, capillary     Status: Abnormal   Collection Time: 02/24/15  4:49 PM  Result Value Ref Range   Glucose-Capillary 107 (H) 65 - 99 mg/dL  Glucose, capillary     Status: Abnormal   Collection Time: 02/24/15  8:59 PM  Result Value Ref Range   Glucose-Capillary 133 (H) 65 - 99 mg/dL   Comment 1 Notify RN   Glucose, capillary     Status: Abnormal   Collection Time: 02/25/15  6:47 AM  Result Value Ref Range   Glucose-Capillary 135 (H) 65 - 99 mg/dL   Comment 1 Notify RN   Glucose, capillary     Status: Abnormal   Collection Time: 02/25/15 12:05  PM  Result Value Ref Range   Glucose-Capillary 133 (H) 65 - 99 mg/dL  Glucose, capillary     Status: Abnormal   Collection Time: 02/25/15  4:24 PM  Result Value Ref Range   Glucose-Capillary 127 (H) 65 - 99 mg/dL  Glucose, capillary     Status: Abnormal   Collection Time: 02/25/15  9:08 PM  Result Value Ref Range   Glucose-Capillary 132 (H) 65 - 99 mg/dL  Glucose, capillary     Status: Abnormal   Collection Time: 02/26/15  6:49 AM  Result Value Ref Range   Glucose-Capillary 163 (H) 65 - 99 mg/dL  Glucose, capillary     Status: Abnormal   Collection Time: 02/26/15 12:29 PM  Result Value Ref Range   Glucose-Capillary 101 (H) 65 - 99 mg/dL     HEENT: normal Cardio: RRR Resp: CTA B/L and unlabored GI: BS positive and nontender nondistended Extremity:  Pulses positive and No Edema Skin:   Intact Neuro: Alert/Oriented Musc/Skel:  Other Left BKA with Coban wrap Gen. no acute distress   Assessment/Plan: 1. Functional deficits secondary to  L-BKA which require 3+ hours per day of interdisciplinary therapy in a comprehensive inpatient rehab setting. Physiatrist is providing close team supervision and 24 hour management of active medical problems listed below.  Physiatrist and rehab team continue to assess barriers to discharge/monitor patient progress toward functional and medical goals. Team conference today please see physician documentation under team conference tab, met with team face-to-face to discuss problems,progress, and goals. Formulized individual treatment plan based on medical history, underlying problem and comorbidities. FIM: FIM - Bathing Bathing Steps Patient Completed: Chest, Right Arm, Left Arm, Abdomen, Front perineal area, Buttocks, Right upper leg, Left upper leg, Right lower leg (including foot) Bathing: 6: More than reasonable amount of time  FIM - Upper Body Dressing/Undressing Upper body dressing/undressing steps patient completed: Thread/unthread right  sleeve of pullover shirt/dresss, Thread/unthread left sleeve of pullover shirt/dress, Put head through opening of pull over shirt/dress, Pull shirt over trunk Upper body dressing/undressing: 5: Set-up assist to: Obtain clothing/put away FIM - Lower Body Dressing/Undressing Lower body dressing/undressing steps patient completed: Thread/unthread right underwear leg, Thread/unthread left underwear leg, Pull underwear up/down, Thread/unthread right pants leg, Thread/unthread left pants leg, Pull pants up/down, Don/Doff right shoe, Fasten/unfasten right shoe, Fasten/unfasten pants Lower body dressing/undressing: 4: Steadying Assist  FIM - Toileting Toileting steps completed by patient: Adjust clothing prior to toileting, Adjust clothing after toileting, Performs perineal hygiene Toileting Assistive Devices: Grab bar or rail for support Toileting: 5: Supervision: Safety issues/verbal cues  FIM - Radio producer Devices: Grab bars, Insurance account manager Transfers: 3-To toilet/BSC: Mod A (lift or lower assist), 4-From toilet/BSC: Min A (steadying Pt. > 75%)  FIM - Bed/Chair Transfer Bed/Chair Transfer Assistive Devices: Walker, Bed rails Bed/Chair Transfer: 6: Supine > Sit: No assist, 5: Bed > Chair or W/C: Supervision (verbal cues/safety issues)  FIM - Locomotion: Wheelchair Distance: 150 Locomotion: Wheelchair: 5: Travels 150 ft or more: maneuvers on rugs and over door sills with supervision, cueing or coaxing FIM - Locomotion: Ambulation Locomotion: Ambulation Assistive Devices: Administrator Ambulation/Gait Assistance: 4: Min assist Locomotion: Ambulation: 1: Travels less than 50 ft with minimal assistance (Pt.>75%)  Comprehension Comprehension Mode: Auditory Comprehension: 5-Understands basic 90% of the time/requires cueing < 10% of the time  Expression Expression Mode: Verbal Expression: 5-Expresses basic needs/ideas: With no assist  Social Interaction Social  Interaction: 7-Interacts appropriately with others - No medications needed.  Problem Solving Problem Solving: 5-Solves basic 90% of the time/requires cueing < 10% of the time  Memory Memory: 7-Complete Independence: No helper  Medical Problem List and Plan: 1. Functional deficits secondary to L-BKA 2. DVT Prophylaxis/Anticoagulation: Pharmaceutical: Lovenox 3. Pain Management: Continue oxycodone prn. phantom pain, gabapentin 4. Mood: Continue Zoloft. LCSW to follow for evaluation and support.  5. Neuropsych: This patient is capable of making decisions on his own behalf. 6. Skin/Wound Care: Continue compressive dressing. Routine pressure relief measures.  7. Fluids/Electrolytes/Nutrition: Monitor I/O. Off supplements if intake poor. Check lytes in am. 8. DM type 2: Will monitor BS with ac/hs checks and use SSI for elevated BS. Continue glucotrol and lantus as per home regiment.  9. Dehydration: Push po fluids. Check labs in am.  10. Pancrease/Renal transplant: On Prograf bid and Cellcept bid. 11. HTN: Will monitor BP every 8 hours. Continue Norvasc and minipress daily LOS (Days) 4 A FACE TO FACE EVALUATION WAS PERFORMED  Mehlani Blankenburg E 02/26/2015, 12:49 PM

## 2015-02-26 NOTE — Progress Notes (Signed)
Occupational Therapy Session Note  Patient Details  Name: [name] MRN: [id number] Date of Birth: [birth year]  Today's Date: 02/26/2015 OT Individual Time:  -       Short Term Goals: Week 1:  OT Short Term Goal 1 (Week 1): Pt. will stand for grooming at sink with SBA OT Short Term Goal 2 (Week 1): Pt. will bathe at SBA level OT Short Term Goal 3 (Week 1): Pt will dress with SBA and sit to stand OT Short Term Goal 4 (Week 1): Pt will transfer to toilet with SBA OT Short Term Goal 5 (Week 1): Pt. will transfer to shower with SBA  Skilled Therapeutic Interventions/Progress Updates:    1:1 Therapeutic activity with focus on activity tolerance, stretching hip flexors, UB strengthening, standing balance and maneuvering w/c through narrow passageways. Pt performed short distance functional ambulation with RW in gym with close supervision. UB strengthening with squats up and down length of mat achieving bottom clearance demonstrating good anterior weight shift. Transitioned into supine on mat on his stomach for prolonged stretch to hip flexors after prolonging sitting position (pt stays in chair most of the day). Performed hip extension and abduction exercises with control. Transferred back into w/c and performed propelling w/c through narrow cones simulated home environment. Pt propelled himself  Back to his room with resistant from therapist to promote continued endurance training and UE strengthening. Left in room with quick release on in w/c.   Therapy Documentation Precautions:  Precautions Precautions: Fall Restrictions Weight Bearing Restrictions: Yes LLE Weight Bearing: Non weight bearing Other Position/Activity Restrictions: L LE Pain:  no c/o pain   See FIM for current functional status  Therapy/Group: Individual Therapy  Roney MansSmith, Rhian Funari [facility] 02/26/2015, 9:09 PM

## 2015-02-27 ENCOUNTER — Inpatient Hospital Stay (HOSPITAL_COMMUNITY): Payer: Medicare Other | Admitting: Physical Therapy

## 2015-02-27 ENCOUNTER — Inpatient Hospital Stay (HOSPITAL_COMMUNITY): Payer: Medicare Other | Admitting: Occupational Therapy

## 2015-02-27 LAB — GLUCOSE, CAPILLARY
Glucose-Capillary: 116 mg/dL — ABNORMAL HIGH (ref 65–99)
Glucose-Capillary: 171 mg/dL — ABNORMAL HIGH (ref 65–99)
Glucose-Capillary: 120 mg/dL — ABNORMAL HIGH (ref 65–99)
Glucose-Capillary: 94 mg/dL (ref 65–99)

## 2015-02-27 NOTE — Progress Notes (Addendum)
Social Work Patient ID: [name], male   DOB: 05-Jan-1951, 64 y.o.   MRN: [id number]   Nigel Sloop, LCSW Social Worker Signed  Patient Care Conference 02/27/2015  1:19 PM    Expand All Collapse All   Inpatient RehabilitationTeam Conference and Plan of Care Update Date: 02/26/2015   Time: 2:00 PM    Patient Name: [name]       Medical Record Number: [id number]  Date of Birth: 01-13-1951 Sex: Male         Room/Bed: 4W07C/4W07C-01 Payor Info: Payor: MEDICARE / Plan: MEDICARE PART A AND B / Product Type: *No Product type* /    Admitting Diagnosis: L BKA   Admit Date/Time:  02/22/2015  5:04 PM Admission Comments: No comment available   Primary Diagnosis:  Status post below knee amputation of left lower extremity Principal Problem: Status post below knee amputation of left lower extremity    Patient Active Problem List     Diagnosis  Date Noted   .  Status post below knee amputation of left lower extremity  02/22/2015   .  S/P BKA (below knee amputation) unilateral  02/20/2015   .  Ischemic ulcer of toe     .  Peripheral vascular disease     .  PVD (peripheral vascular disease)  01/17/2015   .  Cellulitis and abscess of foot  01/13/2015   .  Cellulitis and abscess of foot excluding toe  01/13/2015   .  Diabetes mellitus without complication  01/13/2015   .  Leukocytosis  01/13/2015   .  Diabetes  12/19/2007   .  DYSLIPIDEMIA  12/19/2007   .  ANEMIA  12/19/2007   .  ANXIETY  12/19/2007   .  Essential hypertension  12/19/2007   .  TRANSIENT ISCHEMIC ATTACK  12/19/2007   .  PERIPHERAL VASCULAR DISEASE  12/19/2007   .  RENAL FAILURE  12/19/2007     Expected Discharge Date: Expected Discharge Date: 03/05/15  Team Members Present: Physician leading conference: Dr. Claudette Laws Social Worker Present: Staci Acosta, LCSW Nurse Present: Carmie End, RN PT Present: Katherine Mantle, PT OT Present: Donzetta Kohut, OT;Other (comment) (Amy Melvyn Neth, OT) PPS Coordinator  present : Tora Duck, RN, CRRN        Current Status/Progress  Goal  Weekly Team Focus   Medical     phantom pain in room but not affecting OT  control post op pain  add gabapentin and titrate   Bowel/Bladder     continent of bowel and bladder. LBM 5/26  min assist with bowel movement Q every other day  manage bowel and bladder with min assist    Swallow/Nutrition/ Hydration               ADL's     Min A for transfers and B & D, supervision for safety   Mod I for BADL, supervision for standing balance and bathroom transfers  Transfers, pain managmenet, dynamic sitting/standing balance, endurance, UE strengthening   Mobility     Supervision-min A for basic transfers, supervision w/c mobility, min A gait, +2 for one step negotiation with RW  Supervision overall except min A one step negotiation; family will need to learn w/c bump  LLE ROM and strength, balance, gait    Communication               Safety/Cognition/ Behavioral Observations              Pain  intermittent phantom pain, OXY IR 5-10 mg Q3 hrs PRN. 500 mg Robaxin Q6 hrs PRN.  less than 4  assess Q4 hours, medicate as needed    Skin     Incision to left BKA with staples, cobain dressing in place.    remain free from infection and breakdown while on rehab   Assess Q shift    Rehab Goals Patient on target to meet rehab goals: Yes Rehab Goals Revised: Pt's ambulation goals were downgraded.  Most mod I goals are set at w/c level. *See Care Plan and progress notes for long and short-term goals.    Barriers to Discharge:  Marital discord      Possible Resolutions to Barriers:   may be moving post d/c     Discharge Planning/Teaching Needs:   Pt plans to go to his home at d/c where his wife and her son and his children live with him.  He has intermittent support from them and from his sister.  Most of pt's goals are modified independent at w/c level.  Therapists would still like for family to come for family education  prior to d/c.    Team Discussion:    Pt has phantom pain and MD to adjust medications as needed.  Pt has poor endurance, but given extra time, he can complete ADLs.  Pt's ambulation goals were downgraded.  Family will need to come for family education prior to d/c.  Pt has a rolling walker and bedside commode at home.  Will need w/c and tub bench.   Revisions to Treatment Plan:  none    Continued Need for Acute Rehabilitation Level of Care: The patient requires daily medical management by a physician with specialized training in physical medicine and rehabilitation for the following conditions: Daily direction of a multidisciplinary physical rehabilitation program to ensure safe treatment while eliciting the highest outcome that is of practical value to the patient.: Yes Daily analysis of laboratory values and/or radiology reports with any subsequent need for medication adjustment of medical intervention for : Neurological problems;Other  Simra Fiebig, [name] 02/27/2015, 1:27 PM

## 2015-02-27 NOTE — Progress Notes (Signed)
Social Work Patient ID: [name], male   DOB: [birth year], 64 y.o.   MRN: [id number]   CSW met with pt to update him on team conference discussion.  Pt was pleased to learn that he would be discharged on 03-05-15 and he wants to get his DME from the [facility].  CSW has started this process with Sophia, SW at the [place] and information was faxed to pt's primary care doctor there, Dr. Marsh Dolly.  Pt will need to go to the [place] on Tuesday to get the necessary DME - w/c and tub bench.  CSW will also arrange [place] to continue with him at d/c.  Pt plans to ask his sister to come for family education before d/c next week.  Pt wants to tell his wife about d/c and did not want CSW to call his wife as she is at work.  CSW will continue to follow and assist as needed.

## 2015-02-27 NOTE — Progress Notes (Signed)
Physical Therapy Session Note  Patient Details  Name: [name] MRN: [id number] Date of Birth: August 10, 1951  Today's Date: 02/27/2015 PT Individual Time:  -  1000-1100   Treatment Time: 60 min  Short Term Goals: Week 1:  PT Short Term Goal 1 (Week 1): Patient wil decrease L Knee flexion contracture to -15 of extension. PT Short Term Goal 2 (Week 1): Patient will increase ability to transfer sit to stand with Supervison PT Short Term Goal 3 (Week 1): Patient will increase activity tolerance to be able to stand for up to 10 min with activities PT Short Term Goal 4 (Week 1): Patient will be able to ambulate with FWW on a distance of 60 feet with Supervision PT Short Term Goal 5 (Week 1): Patient will be able to [facility] 2 steps with min A  Skilled Therapeutic Interventions/Progress Updates:    Therapeutic Exercise: Pt received sitting up in w/c with quick release belt in place, on the phone with bank. PT instructs pt in LAQ with maximal knee flexion/extension x 10 reps req cues for attention to task.   Therapeutic Activity: Pt reports he needs to use the bathroom, and so PT instructs pt in wheeling w/c into bathroom after removing legrests, locking brakes, using RW to stand-hop transfer w/c to/from toilet req SBA for safety and verbal cues NOT to use grab bar since he does not and will not have these at home (he still intermittently uses it, though). Pt completes pants management with SBA for safety and hygiene independently.   Gait Training: PT instructs pt in ambulation with RW x 150' req SBA for safety.  PT instructs pt in ascending 8" step with RW backwards x 6 reps req CGA for safety.   W/C Management: PT instructs pt in w/c propulsion with B UEs x 200' req SBA for safety/reminders to lock brakes.   Pt is progressing with functional mobility. Ended with quick-release belt in place in w/c. Continue per PT POC.   Therapy Documentation Precautions:   Precautions Precautions: Fall Restrictions Weight Bearing Restrictions: Yes LLE Weight Bearing: Non weight bearing Other Position/Activity Restrictions: L LE Vital Signs: Therapy Vitals Temp: 98.9 F (37.2 C) Temp Source: Oral Pulse Rate: 87 Resp: 18 BP: 113/63 mmHg Patient Position (if appropriate): Sitting Oxygen Therapy SpO2: 100 % O2 Device: Not Delivered Pain: Pain Assessment Pain Assessment: No/denies pain   See FIM for current functional status  Therapy/Group: Individual Therapy  [name] M 02/27/2015, 8:49 AM

## 2015-02-27 NOTE — Progress Notes (Signed)
Recreational Therapy Session Note  Patient Details  Name: [name] MRN: [id number] Date of Birth: 08-22-1951 Today's Date: 02/27/2015  Pain: no c/o Skilled Therapeutic Interventions/Progress Updates: Session focused on planning for meal prep activity as pt expressed interest in cooking on eval while standing at kitchen counter with supervision.  Also discussed stressors in regards to discharge plans as pt had expressed concerns with living environment on eval.   Ryann Leavitt 02/27/2015, 3:38 PM

## 2015-02-27 NOTE — Progress Notes (Signed)
Occupational Therapy Session Note  Patient Details  Name: [name] MRN: [id number] Date of Birth: 1951-09-21  Today's Date: 02/27/2015 OT Individual Time: [id number] and 1330-1500 OT Individual Time Calculation (min): 60 min and 90 min   Short Term Goals: Week 1:  OT Short Term Goal 1 (Week 1): Pt. will stand for grooming at sink with SBA OT Short Term Goal 2 (Week 1): Pt. will bathe at SBA level OT Short Term Goal 3 (Week 1): Pt will dress with SBA and sit to stand OT Short Term Goal 4 (Week 1): Pt will transfer to toilet with SBA OT Short Term Goal 5 (Week 1): Pt. will transfer to shower with SBA  Skilled Therapeutic Interventions/Progress Updates:    Session One: Pt seen for ADL bathing and dressing session. Pt asleep in supine upon arrival able to be awoken. With increased time, pt came to sitting EOB with supervision. Pt dressed LB seated EOB, using lateral leans to pull pants up. Pt transferred into w/c via squat pivot transfer with set-up of w/c. Pt completed grooming task seated in front of the sink mod I, standing to complete oral care with supervision in order to increase static and dynamic standing balance. Pt required one UE support as stabilizer during standing task. Pt navigated w/c around room and unit supervision-mod accessing various  Areas of his room including low drawers from w/c level, demonstarating good safety awareness.  Pt self propelled w/c to ADL apartment with supervision for environmental hazards due to impaired vision. In ADL apartment, pt completed squat pivot transfer to low soft couch with VCs for set-up of w/c in prep for transfer. Pt then ambulated into apartment bathroom with RW and supervision and completed simulated tub bench transfer with VCs for technique. Pt ambulated into kitchen ~4510ft with supervision and removed and replaced items from cabinets  In order to increase functional static and dynamic standing balance. Pt tolerated ~7 minutes of standing  with supervision, and ambulated back to w/c for seated rest break. Rec therapist present for end of session, facilitating conversation regarding completing cooking task prior to d/c and regarding d/c plans and stresses. Pt voiced increased stress regarding d/c home, OT will follow up with CSW. Pt returned to room at end of session, left in w/c with all needs in reach.   Session Two: Pt in w/c upon arrival, agreeable to tx. Session focused on w/c mobility, UE and LE strengthening, and functional activity tolerance. Pt self propelled w/c throughout unit and to off floor solarium with emphasis on community mobility and managing w/c through tight environments, over uneven surfaces, and for community distances. Pt educated regarding how to enter and exit elevator, demonstrating good carry over of education throughout session. In solarium, pt transferred w/c> couch with VCs for set-up of w/c prior to transfer. Seated on couch, education provided regarding d/c planning, home use of BSC, energy conservation, planning for good days and bad days, and creating shopping list for cooking task to occur next week. Pt transferred back to w/c and self propelled w/c back to unit to therapy gym where he transferred to mat with supervision via scoot pivot. Seated on mat, pt completed UE strengthening tasks using medium level (green) theraband. Exercises completed in all planes x10 reps at all major muscle groups with cues and demonstration provided for proper form and technique. Pt then completed lateral lean exercises, required to lean to R then L and catch ball upon upright sitting in order to increase functional sitting balance  and improve lateral leans in prep for seated LB dressing task. Pt then returned to sidelying on therapy mat and completed LE strengthening exercises completing x10 reps of hip flexion/extension and ab/adduction. Exercises completed x10 reps in sidelying, supine, and prone with rest breaks between sets. Pt  transferred back to w/c at end of session and self propelled w/c back to room. Pt left in w/c at end of session, safety belt donned, all needs in reach and caregivers present.  Pt's caregivers present upon return to room. Education provided regarding role of OT, POC, and planning for family education day. Pt's caregiver's able to attend therapy on Monday, the day prior to pt's d/c. CSW and therapy team made aware.   Therapy Documentation Precautions:  Precautions Precautions: Fall Restrictions Weight Bearing Restrictions: Yes LLE Weight Bearing: Non weight bearing Other Position/Activity Restrictions: L LE Pain: Pain Assessment Pain Assessment: No/denies pain    See FIM for current functional status  Therapy/Group: Individual Therapy  [name], [name] 02/27/2015, 7:16 AM

## 2015-02-27 NOTE — Progress Notes (Signed)
64 y.o. male with history of DM type 2 with retinopathy, s/p renal-pancreas transplant, PTSD, PVD with left foot infection and failed attempts at limb salvage. 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 Sharol Given. Post op limited by pain as well as weakness affecting mobility Subjective/Complaints: Phantom limb pain present but tolerable. Moving bowels. Sleeping well. Pt denies nausea, vomiting, abdominal pain, diarrhea, chest pain, shortness of breath, palpitations, dizziness  Objective: Vital Signs: Blood pressure 122/49, pulse 80, temperature 98.9 F (37.2 C), temperature source Oral, resp. rate 18, height $RemoveBe'5\' 10"'JFLpANRiX$  (1.778 m), weight 76.204 kg (168 lb), SpO2 98 %. No results found. Results for orders placed or performed during the hospital encounter of 02/22/15 (from the past 72 hour(s))  Glucose, capillary     Status: Abnormal   Collection Time: 02/24/15 11:37 AM  Result Value Ref Range   Glucose-Capillary 188 (H) 65 - 99 mg/dL  Glucose, capillary     Status: Abnormal   Collection Time: 02/24/15  4:49 PM  Result Value Ref Range   Glucose-Capillary 107 (H) 65 - 99 mg/dL  Glucose, capillary     Status: Abnormal   Collection Time: 02/24/15  8:59 PM  Result Value Ref Range   Glucose-Capillary 133 (H) 65 - 99 mg/dL   Comment 1 Notify RN   Glucose, capillary     Status: Abnormal   Collection Time: 02/25/15  6:47 AM  Result Value Ref Range   Glucose-Capillary 135 (H) 65 - 99 mg/dL   Comment 1 Notify RN   Glucose, capillary     Status: Abnormal   Collection Time: 02/25/15 12:05 PM  Result Value Ref Range   Glucose-Capillary 133 (H) 65 - 99 mg/dL  Glucose, capillary     Status: Abnormal   Collection Time: 02/25/15  4:24 PM  Result Value Ref Range   Glucose-Capillary 127 (H) 65 - 99 mg/dL  Glucose, capillary     Status: Abnormal   Collection Time: 02/25/15  9:08 PM  Result Value Ref Range   Glucose-Capillary 132 (H) 65 - 99 mg/dL  Glucose, capillary     Status: Abnormal   Collection  Time: 02/26/15  6:49 AM  Result Value Ref Range   Glucose-Capillary 163 (H) 65 - 99 mg/dL  Glucose, capillary     Status: Abnormal   Collection Time: 02/26/15 12:29 PM  Result Value Ref Range   Glucose-Capillary 101 (H) 65 - 99 mg/dL  Glucose, capillary     Status: None   Collection Time: 02/26/15  4:27 PM  Result Value Ref Range   Glucose-Capillary 94 65 - 99 mg/dL  Glucose, capillary     Status: Abnormal   Collection Time: 02/26/15  9:25 PM  Result Value Ref Range   Glucose-Capillary 143 (H) 65 - 99 mg/dL   Comment 1 Notify RN   Glucose, capillary     Status: Abnormal   Collection Time: 02/27/15  6:54 AM  Result Value Ref Range   Glucose-Capillary 116 (H) 65 - 99 mg/dL     HEENT: normal Cardio: RRR Resp: CTA B/L and unlabored GI: BS positive and nontender nondistended Extremity:  Pulses positive and No Edema Skin:   Intact Neuro: Alert/Oriented Musc/Skel:  Other Left BKA with Coban wrap---minimal tenderness with manipulation of limb Gen. no acute distress   Assessment/Plan: 1. Functional deficits secondary to  L-BKA which require 3+ hours per day of interdisciplinary therapy in a comprehensive inpatient rehab setting. Physiatrist is providing close team supervision and 24 hour management of  active medical problems listed below. Physiatrist and rehab team continue to assess barriers to discharge/monitor patient progress toward functional and medical goals. Team conference today please see physician documentation under team conference tab, met with team face-to-face to discuss problems,progress, and goals. Formulized individual treatment plan based on medical history, underlying problem and comorbidities. FIM: FIM - Bathing Bathing Steps Patient Completed: Chest, Right Arm, Left Arm, Abdomen, Front perineal area, Buttocks, Right upper leg, Left upper leg, Right lower leg (including foot) Bathing: 6: More than reasonable amount of time  FIM - Upper Body  Dressing/Undressing Upper body dressing/undressing steps patient completed: Thread/unthread right sleeve of pullover shirt/dresss, Thread/unthread left sleeve of pullover shirt/dress, Put head through opening of pull over shirt/dress, Pull shirt over trunk Upper body dressing/undressing: 5: Set-up assist to: Obtain clothing/put away FIM - Lower Body Dressing/Undressing Lower body dressing/undressing steps patient completed: Thread/unthread right underwear leg, Thread/unthread left underwear leg, Pull underwear up/down, Thread/unthread right pants leg, Thread/unthread left pants leg, Pull pants up/down, Don/Doff right shoe, Fasten/unfasten right shoe, Fasten/unfasten pants Lower body dressing/undressing: 4: Steadying Assist  FIM - Toileting Toileting steps completed by patient: Adjust clothing prior to toileting, Adjust clothing after toileting, Performs perineal hygiene Toileting Assistive Devices: Grab bar or rail for support Toileting: 5: Supervision: Safety issues/verbal cues  FIM - Radio producer Devices: Grab bars, Insurance account manager Transfers: 3-To toilet/BSC: Mod A (lift or lower assist), 4-From toilet/BSC: Min A (steadying Pt. > 75%)  FIM - Bed/Chair Transfer Bed/Chair Transfer Assistive Devices: Walker, Bed rails Bed/Chair Transfer: 6: Supine > Sit: No assist, 6: Sit > Supine: No assist, 5: Bed > Chair or W/C: Supervision (verbal cues/safety issues), 5: Chair or W/C > Bed: Supervision (verbal cues/safety issues)  FIM - Locomotion: Wheelchair Distance: 150 Locomotion: Wheelchair: 5: Travels 150 ft or more: maneuvers on rugs and over door sills with supervision, cueing or coaxing FIM - Locomotion: Ambulation Locomotion: Ambulation Assistive Devices: Administrator Ambulation/Gait Assistance: 5: Supervision Locomotion: Ambulation: 2: Travels 50 - 149 ft with supervision/safety issues  Comprehension Comprehension Mode: Auditory Comprehension: 5-Understands  basic 90% of the time/requires cueing < 10% of the time  Expression Expression Mode: Verbal Expression: 5-Expresses basic needs/ideas: With no assist  Social Interaction Social Interaction: 7-Interacts appropriately with others - No medications needed.  Problem Solving Problem Solving: 5-Solves basic 90% of the time/requires cueing < 10% of the time  Memory Memory: 7-Complete Independence: No helper  Medical Problem List and Plan: 1. Functional deficits secondary to L-BKA 2. DVT Prophylaxis/Anticoagulation: Pharmaceutical: Lovenox 3. Pain Management: Continue oxycodone prn. phantom pain, gabapentin 4. Mood: Continue Zoloft. LCSW to follow for evaluation and support.  5. Neuropsych: This patient is capable of making decisions on his own behalf. 6. Skin/Wound Care: Continue compressive dressing. Routine pressure relief measures.   -may remove dressing tomorrow in am. 7. Fluids/Electrolytes/Nutrition: Monitor I/O. Off supplements if intake poor.   8. DM type 2:   Continue glucotrol and lantus as per home regiment.   -reasonable control overall 9. Dehydration: Push po fluids.    10. Pancrease/Renal transplant: On Prograf bid and Cellcept bid. 11. HTN: Will monitor BP every 8 hours. Continue Norvasc and minipress daily LOS (Days) 5 A FACE TO FACE EVALUATION WAS PERFORMED  Kennita Pavlovich T 02/27/2015, 7:30 AM

## 2015-02-27 NOTE — Patient Care Conference (Addendum)
Inpatient RehabilitationTeam Conference and Plan of Care Update Date: 02/26/2015   Time: 2:00 PM    Patient Name: [name]      Medical Record Number: [id number]  Date of Birth: 19-Feb-1951 Sex: Male         Room/Bed: 4W07C/4W07C-01 Payor Info: Payor: MEDICARE / Plan: MEDICARE PART A AND B / Product Type: *No Product type* /    Admitting Diagnosis: L BKA  Admit Date/Time:  02/22/2015  5:04 PM Admission Comments: No comment available   Primary Diagnosis:  Status post below knee amputation of left lower extremity Principal Problem: Status post below knee amputation of left lower extremity  Patient Active Problem List   Diagnosis Date Noted  . Status post below knee amputation of left lower extremity 02/22/2015  . S/P BKA (below knee amputation) unilateral 02/20/2015  . Ischemic ulcer of toe   . Peripheral vascular disease   . PVD (peripheral vascular disease) 01/17/2015  . Cellulitis and abscess of foot 01/13/2015  . Cellulitis and abscess of foot excluding toe 01/13/2015  . Diabetes mellitus without complication 01/13/2015  . Leukocytosis 01/13/2015  . Diabetes 12/19/2007  . DYSLIPIDEMIA 12/19/2007  . ANEMIA 12/19/2007  . ANXIETY 12/19/2007  . Essential hypertension 12/19/2007  . TRANSIENT ISCHEMIC ATTACK 12/19/2007  . PERIPHERAL VASCULAR DISEASE 12/19/2007  . RENAL FAILURE 12/19/2007    Expected Discharge Date: Expected Discharge Date: 03/05/15  Team Members Present: Physician leading conference: Dr. Claudette Laws Social Worker Present: Staci Acosta, LCSW Nurse Present: Carmie End, RN PT Present: Katherine Mantle, PT OT Present: Donzetta Kohut, OT;Other (comment) (Amy Melvyn Neth, OT) PPS Coordinator present : Tora Duck, RN, CRRN     Current Status/Progress Goal Weekly Team Focus  Medical   phantom pain in room but not affecting OT  control post op pain  add gabapentin and titrate   Bowel/Bladder   continent of bowel and bladder. LBM 5/26  min assist with bowel  movement Q every other day  manage bowel and bladder with min assist   Swallow/Nutrition/ Hydration             ADL's   Min A for transfers and B & D, supervision for safety  Mod I for BADL, supervision for standing balance and bathroom transfers  Transfers, pain managmenet, dynamic sitting/standing balance, endurance, UE strengthening   Mobility   Supervision-min A for basic transfers, supervision w/c mobility, min A gait, +2 for one step negotiation with RW  Supervision overall except min A one step negotiation; family will need to learn w/c bump  LLE ROM and strength, balance, gait   Communication             Safety/Cognition/ Behavioral Observations            Pain   intermittent phantom pain, OXY IR 5-10 mg Q3 hrs PRN. 500 mg Robaxin Q6 hrs PRN.  less than 4  assess Q4 hours, medicate as needed   Skin   Incision to left BKA with staples, cobain dressing in place.   remain free from infection and breakdown while on rehab  Assess Q shift    Rehab Goals Patient on target to meet rehab goals: Yes Rehab Goals Revised: Pt's ambulation goals were downgraded.  Most mod I goals are set at w/c level. *See Care Plan and progress notes for long and short-term goals.  Barriers to Discharge: Marital discord    Possible Resolutions to Barriers:  may be moving post d/c    Discharge Planning/Teaching Needs:  Pt plans to go to his home at d/c where his wife and her son and his children live with him.  He has intermittent support from them and from his sister.  Most of pt's goals are modified independent at w/c level.  Therapists would still like for family to come for family education prior to d/c.   Team Discussion:  Pt has phantom pain and MD to adjust medications as needed.  Pt has poor endurance, but given extra time, he can complete ADLs.  Pt's ambulation goals were downgraded.  Family will need to come for family education prior to d/c.  Pt has a rolling walker and bedside commode at  home.  Will need w/c and tub bench.  Revisions to Treatment Plan: none   Continued Need for Acute Rehabilitation Level of Care: The patient requires daily medical management by a physician with specialized training in physical medicine and rehabilitation for the following conditions: Daily direction of a multidisciplinary physical rehabilitation program to ensure safe treatment while eliciting the highest outcome that is of practical value to the patient.: Yes Daily analysis of laboratory values and/or radiology reports with any subsequent need for medication adjustment of medical intervention for : Neurological problems;Other  [name], [name] 02/27/2015, 1:27 PM

## 2015-02-28 ENCOUNTER — Inpatient Hospital Stay (HOSPITAL_COMMUNITY): Payer: Medicare Other | Admitting: *Deleted

## 2015-02-28 ENCOUNTER — Inpatient Hospital Stay (HOSPITAL_COMMUNITY): Payer: Medicare Other | Admitting: Physical Therapy

## 2015-02-28 ENCOUNTER — Encounter (HOSPITAL_COMMUNITY): Payer: Self-pay | Admitting: *Deleted

## 2015-02-28 ENCOUNTER — Inpatient Hospital Stay (HOSPITAL_COMMUNITY): Payer: Medicare Other

## 2015-02-28 DIAGNOSIS — Z89512 Acquired absence of left leg below knee: Secondary | ICD-10-CM

## 2015-02-28 DIAGNOSIS — I10 Essential (primary) hypertension: Secondary | ICD-10-CM

## 2015-02-28 DIAGNOSIS — E119 Type 2 diabetes mellitus without complications: Secondary | ICD-10-CM

## 2015-02-28 LAB — GLUCOSE, CAPILLARY
Glucose-Capillary: 95 mg/dL (ref 65–99)
Glucose-Capillary: 176 mg/dL — ABNORMAL HIGH (ref 65–99)
Glucose-Capillary: 137 mg/dL — ABNORMAL HIGH (ref 65–99)
Glucose-Capillary: 120 mg/dL — ABNORMAL HIGH (ref 65–99)

## 2015-02-28 NOTE — Progress Notes (Signed)
Social Work Assessment and Plan  Patient Details  Name: [name] MRN: [id number] Date of Birth: 1951/02/09  Today's Date: 02/28/2015  Problem List:  Patient Active Problem List   Diagnosis Date Noted  . Status post below knee amputation of left lower extremity 02/22/2015  . S/P BKA (below knee amputation) unilateral 02/20/2015  . Ischemic ulcer of toe   . Peripheral vascular disease   . PVD (peripheral vascular disease) 01/17/2015  . Cellulitis and abscess of foot 01/13/2015  . Cellulitis and abscess of foot excluding toe 01/13/2015  . Diabetes mellitus without complication 95/62/1308  . Leukocytosis 01/13/2015  . Diabetes 12/19/2007  . DYSLIPIDEMIA 12/19/2007  . ANEMIA 12/19/2007  . ANXIETY 12/19/2007  . Essential hypertension 12/19/2007  . TRANSIENT ISCHEMIC ATTACK 12/19/2007  . PERIPHERAL VASCULAR DISEASE 12/19/2007  . RENAL FAILURE 12/19/2007   Past Medical History:  Past Medical History  Diagnosis Date  . Renal disorder   . Diabetes mellitus without complication   . CPAP (continuous positive airway pressure) dependence   . Sleep apnea   . Legally blind in left eye, as defined in [place]   . PTSD (post-traumatic stress disorder)   . PVD (peripheral vascular disease) 01/17/2015  . Hypertension   . Anxiety   . GERD (gastroesophageal reflux disease)    Past Surgical History:  Past Surgical History  Procedure Laterality Date  . Combined kidney-pancreas transplant Left 2009  . Incision and drainage abscess Left 01/13/2015    Procedure: INCISION AND DRAINAGE OF LEFT FOOT FIRST RAY ABCESS;  Surgeon: Mcarthur Rossetti, MD;  Location: [place];  Service: Orthopedics;  Laterality: Left;  . Abdominal aortagram N/A 01/21/2015    Procedure: ABDOMINAL Maxcine Ham;  Surgeon: Angelia Mould, MD;  Location[facility] CATH LAB;  Service: Cardiovascular;  Laterality: N/A;  . Lower extremity angiogram Left 01/21/2015    Procedure: LOWER EXTREMITY ANGIOGRAM;  Surgeon: Angelia Mould, MD;  Location: [facility] CATH LAB;  Service: Cardiovascular;  Laterality: Left;  . Amputation Left 01/23/2015    Procedure: FIRST RAY AMPUTATION/LEFT FOOT;  Surgeon: Newt Minion, MD;  Location: [place];  Service: Orthopedics;  Laterality: Left;  . Av fistula placement    . Colonoscopy    . Amputation Left 02/20/2015    Procedure: AMPUTATION BELOW KNEE;  Surgeon: Newt Minion, MD;  Location: [place];  Service: Orthopedics;  Laterality: Left;   Social History:  reports that he has quit smoking. He has never used smokeless tobacco. He reports that he drinks alcohol. He reports that he does not use illicit drugs.  Family / Support Systems Marital Status: Married How Long?: 15 years Patient Roles: Spouse, Parent (father and step father) Spouse/Significant Other: Corben Auzenne - wife - [phone] Anticipated Caregiver: self, wife and family Ability/Limitations of Caregiver: Wife works days 8-12 hours 6am-230pm or 6am-630pm Caregiver Availability: Intermittent Family Dynamics: Pt's wife's son and his children are currently living with pt.  This has caused pt some significant stress.  He would like for his home to just be for himself and his wife again.  Social History Preferred language: English Religion: Holiness Read: Yes Write: Yes Employment Status: Retired Date Retired/Disabled/Unemployed: 2007 Age Retired: 55 Public relations account executive Issues: none reported Guardian/Conservator: N/A   Abuse/Neglect Physical Abuse: Denies Verbal Abuse: Denies Sexual Abuse: Denies Exploitation of patient/patient's resources: Denies Self-Neglect: Denies  Emotional Status Pt's affect, behavior and adjustment status: Pt is clearly frustrated with his home situation, but is remaining positive about his recovery from surgery  and his rehabilitation.  His faith is important to him and he states that this keeps him upbeat and hopeful. Recent Psychosocial Issues: Pt's wife's son and his children are  living with pt and wife and pt is frustrated about this and wishes his wife would do something different about the situation.  Pt has tried to mentor and encourage step son, but he is not willing to accept this and pt feels he is taking advantage of pt and his wife.  Pt is often times left with the children without even being asked to care for them. Psychiatric History: Pt's chart states a hx of anxiety, but pt reports feeling calm while here on Rehab.  He is not currently taking anything for anxiety.  Further mental health assessment can occur should need arise. Substance Abuse History: none reported  Patient / Family Perceptions, Expectations & Goals Pt/Family understanding of illness & functional limitations: Pt expressed a good understanding of his condition and has no outstanding questions for the MD at this time.  Pt knows he can ask for clarification/information at any time. Premorbid pt/family roles/activities: Pt enjoys watching sports.  He also used to sing in the choir at church.  Anticipated changes in roles/activities/participation: Pt plans to get back to doing the above activities.  Pt hopes to return to helping around the house again. Pt/family expectations/goals: Pt wants to learn how to manage with new amputation and get back to enjoying life.  Community [employer] Agencies: None Premorbid Home Care/DME Agencies: Other (Comment) ([place] - PT/OT/RN) Transportation available at discharge: pt's family Resource referrals recommended: Neuropsychology, Support group (specify) ([place].  Pt declines Neuropsychology referral at this time when CSW offered it to him.)  Discharge Planning Living Arrangements: Spouse/significant other, Other relatives Support Systems: Spouse/significant other, Other relatives, Friends/neighbors, Social worker community, Home care staff Type of Residence: Private residence Insurance Resources: [employer]  Resources: [place], Other (Comment) (retirement from the [employer]) Museum/gallery curator Screen Referred: No Money Management: Patient, Spouse Does the patient have any problems obtaining your medications?: No Home Management: Pt's wife will manage home while pt is recovering.  Prior to admission, pt was helping with this. Patient/Family Preliminary Plans: Pt plans to return to his home where he will be mostly independent in his care, but will have supervision as needed for some tasks. Barriers to Discharge: Steps Social Work Anticipated Follow Up Needs: HH/OP, Support [place] Additional Notes/Comments: Pt's sister to come for family education on 03-04-15. Expected length of stay: 7-10 days  Clinical Impression CSW met with pt to introduce self and role of CSW, as well as to complete assessment.  Pt plans to tell wife about his d/c plan and did not want for CSW to call to talk to her as she was working.  Pt and wife have wife's son and children in there home currently and pt reports he does not want them there anymore.  This has caused tension between pt and his wife and he has considered moving out on his own.  Pt has a supportive sister who will help him at d/c, as she is retired.  Pt was seeing things from his wife's perspective when CSW talked with him and he was planning to apologize to her on 02-27-15.  Pt has a strong faith and he is remorseful for how he treated his wife and hopes she will forgive him.  Pt will be alone at times at d/c and have supervision at other times.  He wants to get his DME from the [place], so CSW faxed them the order and the plan is for them to get it together for him and then he will go to the [facility] on 03[facility]05-15 after he is d/c'd from [place] to pick it up.  Pt feels someone can take him there on Tuesday.  Pt has had [place] leading up to this admission and plans to continue to use them.  CSW will continue to follow and support pt as needed.  Olena Willy, Silvestre Mesi 02/28/2015, 1:38 PM

## 2015-02-28 NOTE — Progress Notes (Signed)
Occupational Therapy Session Note  Patient Details  Name: [name] DikeClarence Gonsalez MRN: [id number] Date of Birth: [birth year]  Today's Date: 02/28/2015 OT Individual Time: 0730-0830 OT Individual Time Calculation (min): 60 min    Short Term Goals: Week 1:  OT Short Term Goal 1 (Week 1): Pt. will stand for grooming at sink with SBA OT Short Term Goal 2 (Week 1): Pt. will bathe at SBA level OT Short Term Goal 3 (Week 1): Pt will dress with SBA and sit to stand OT Short Term Goal 4 (Week 1): Pt will transfer to toilet with SBA OT Short Term Goal 5 (Week 1): Pt. will transfer to shower with SBA  Skilled Therapeutic Interventions/Progress Updates: ADL-retraining with focus on adapted bathing/dressing skills, discharge planning, and dynamic supported standing balance.   Pt able to rise from supine to edge of bed unassisted using bed rail, ambulate to bathroom with standby assist, transfer to tub bench with min guard and completed seated bathing, standing to wash his buttocks using grab bar for support.   Pt dressed at w/c level using RW as support to stand but demo'd LOB when lifting his arms off RW to pull up his pants.   Pt grooms at w/c level unassisted and remained in w/c at end of session to self-feed breakfast.   Pt aware of balance deficits and was receptive to recommendation to consider w/c level performance of BADL with supervision for tub transfers.   Pt [facility] bench and hand held shower.     Therapy Documentation Precautions:  Precautions Precautions: Fall Restrictions Weight Bearing Restrictions: Yes LLE Weight Bearing: Non weight bearing Other Position/Activity Restrictions: L LE General:   Vital Signs: Therapy Vitals Temp: 98.2 F (36.8 C) Temp Source: Oral Pulse Rate: 91 Resp: 17 BP: (!) 149/62 mmHg Patient Position (if appropriate): Lying Oxygen Therapy SpO2: 100 % O2 Device: Not Delivered   Pain: No/denies  pain  See FIM for current functional status  Therapy/Group: Individual Therapy   Second session: Time: 1100-1200 Time Calculation (min):  60 min  Pain Assessment: No/denies pain  Skilled Therapeutic Interventions: Therapeutic activity with focus on dynamic sitting/standing balance, functional mobility using RW and safety awareness.   Pt attempted performance of sustained unsupported dynamic standing during planned table top activity, playing board game (veritcally mounted).   Pt was able to make 2 plays but required min support using his left hand on table top to place game piece with his right hand.    OT then downgraded task to challenge dynamic sitting balance and increase core strengthening using Swiss ball but again pt reported fear of fall while sitting and was unable to sustain unsupported sitting balance on Swiss ball.    Following sitting/standing balance activities, pt was escorted to laundry room and instructed on use of washer/dryer to load his dirty clothes.   Pt ambulated to machine, placed laundry and activated washer with only instructional cues to locate buttons.   OT re-educated pt on his performance with dynamic standing tasks, both during PT and OT sessions, and pt acknowledged fear of falls as primary concern but verbalized plan to remain at w/c level during BADL/iADL s/p discharge, with supervision during transfers.   See FIM for current functional status  Therapy/Group: Individual Therapy  Makail Watling 02/28/2015, 8:30 AM

## 2015-02-28 NOTE — Progress Notes (Signed)
[facility] Individual Statement of Services  Patient Name:  [name] DikeClarence [name]  Date:  02/28/2015  [facility] the [facility].  Our goal is to provide you with an individualized program based on your diagnosis and situation, designed to meet your specific needs.  With this comprehensive rehabilitation program, you will be expected to participate in at least 3 hours of rehabilitation therapies Monday-Friday, with modified therapy programming on the weekends.  Your rehabilitation program will include the following services:  Physical Therapy (PT), Occupational Therapy (OT), 24 hour per day rehabilitation nursing, Therapeutic Recreation (TR), Case Management (Social Worker), Rehabilitation Medicine, Nutrition Services and Pharmacy Services  Weekly team conferences will be held on Tuesdays to discuss your progress.  Your Social Worker will talk with you frequently to get your input and to update you on team discussions.  Team conferences with you and your family in attendance may also be held.  Expected length of stay:  7 to 10 days  Overall anticipated outcome:  Modified Independent  Depending on your progress and recovery, your program may change. Your Social Worker will coordinate services and will keep you informed of any changes. Your Social Worker's name and contact numbers are listed  below.  The following services may also be recommended but are not provided by the [facility]:   Driving Evaluations  Home Health Rehabiltiation Services  Outpatient Rehabilitation Services   Arrangements will be made to provide these services after discharge if needed.  Arrangements include referral to agencies that provide these services.  Your insurance has been verified to be:  Medicare Your primary doctor is:  Dr. Gertha CalkinSusan Isbey  Pertinent information will be shared with your doctor and your insurance company.  Social Worker:  Staci AcostaJenny Raif Chachere,  LCSW  [phone] or ([phone]) [phone]  Information discussed with and copy given to patient by: Elvera LennoxPrevatt, Jasamine Pottinger Capps, 02/28/2015, 11:23 AM

## 2015-02-28 NOTE — Progress Notes (Signed)
Physical Therapy Session Note  Patient Details  Name: Ascencion DikeClarence Ulysse MRN: [id number] Date of Birth: 12-12-1950  Today's Date: 02/28/2015 PT Individual Time:  -  [phone] Treatment Time: 90 min      Short Term Goals: Week 1:  PT Short Term Goal 1 (Week 1): Patient wil decrease L Knee flexion contracture to -15 of extension. PT Short Term Goal 2 (Week 1): Patient will increase ability to transfer sit to stand with Supervison PT Short Term Goal 3 (Week 1): Patient will increase activity tolerance to be able to stand for up to 10 min with activities PT Short Term Goal 4 (Week 1): Patient will be able to ambulate with FWW on a distance of 60 feet with Supervision PT Short Term Goal 5 (Week 1): [facility] will be [facility] steps with min A  Skilled Therapeutic Interventions/Progress Updates:    Treatment Session 1: Gait Training: PT instructs pt in ambulation with RW x 90' (2 LOB self corrected from catching R foot on ground when hopping) + 95' (no LOB on second bout).  PT instructs pt in ascending 8" high curb backwards with RW req CGA for safety - once up, though, pt is unable to achieve balance on R leg in order to lift RW onto curb. PT provides pt underarm support on R side so that she can lift RW for him, but pt becomes extremely anxious & fearful and R knee begins to buckle, and pt req max A to recover standing balance, then CGA to use RW to hop forward down from curb step and return to w/c.   Therapeutic Exercise: PT instructs pt in mat exercises for B LE ROM & strengthening (specifically prone exercises): prone knee flexion, prone press up, prone hip extension, side lie hip abduction ( L leg only), hip adduction with ball between knees in supine (5 second holds): 3 x 10 reps to each LE.   Therapeutic Activity: PT instructs pt in pre-falls recovery mat mobility: prone to/from quadruped x 10 reps req CGA, progressing to SBA with increased repetitions.   Neuromuscular  Reeducation: PT instructs pt in dynamic standing balance and prolonged standing tolerance activity with RW and one arm support: picking out red & then green clothespins to place on wire x 1 set, then second set of removing them - req CGA for safety. Pt is visibly shaky from nervousness, but has no LOB req assist to correct.   Pt is progressing well, but becomes visibly anxious and fearful with attempt to stand on R leg only (with PT support) in order to lift RW onto curb after ascending curb backwards. Pt has a near fall and req a rest break to stop trembling from fear. PT explains to pt that it is good that this happened, because now it is apparent that standing balance with single UE, progressing to no UE support is needed to be practiced daily in order to prepare for d/c home. Continue per PT pOC.     Therapy Documentation Precautions:  Precautions Precautions: Fall Restrictions Weight Bearing Restrictions: Yes LLE Weight Bearing: Non weight bearing Other Position/Activity Restrictions: L LE Pain: Pain Assessment Pain Assessment: 0-10 Pain Score: 4  Pain Type: Surgical pain Pain Location: Leg Pain Orientation: Left Pain Descriptors / Indicators: Aching Pain Onset: On-going Pain Intervention(s): Medication (See eMAR) Multiple Pain Sites: Yes 2nd Pain Site Pain Score: 2 Pain Type: Phantom pain Pain Location: Toe (Comment which one) Pain Orientation: Left Pain Descriptors / Indicators:  Burning Pain Onset: Gradual Pain Intervention(s): Other (Comment) (mirror therapy)  See FIM for current functional status  Therapy/Group: Individual Therapy  Rashonda Warrior M 02/28/2015, 8:32 AM

## 2015-02-28 NOTE — Progress Notes (Signed)
Recreational Therapy Session Note  Patient Details  Name: [name] MRN: [id number] Date of Birth: [birth year] Today's Date: 02/28/2015  Pain: no c/o Skilled Therapeutic Interventions/Progress Updates: Session focused on activity tolerance, w/c mobility throughout hospital using BUE's negotiating obstacles with supervision.  Continued discussion about meal prep activity and finalized ingredient list.  Therapy/Group: Individual Therapy   Carmyn Hamm 02/28/2015, 3:59 PM

## 2015-02-28 NOTE — Progress Notes (Signed)
63 y.o. male with history of DM type 2 with retinopathy, s/p renal-pancreas transplant, PTSD, PVD with left foot infection and failed attempts at limb salvage. 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 Sharol Given. Post op limited by pain as well as weakness affecting mobility  Subjective/Complaints: Good night. No new complaints.  leeping well. Pt denies nausea, vomiting, abdominal pain, diarrhea, chest pain, shortness of breath, palpitations, dizziness  Objective: Vital Signs: Blood pressure 149/62, pulse 91, temperature 98.2 F (36.8 C), temperature source Oral, resp. rate 17, height $RemoveBe'5\' 10"'CsOUNfMvq$  (1.778 m), weight 76.204 kg (168 lb), SpO2 100 %. No results found. Results for orders placed or performed during the hospital encounter of 02/22/15 (from the past 72 hour(s))  Glucose, capillary     Status: Abnormal   Collection Time: 02/25/15 12:05 PM  Result Value Ref Range   Glucose-Capillary 133 (H) 65 - 99 mg/dL  Glucose, capillary     Status: Abnormal   Collection Time: 02/25/15  4:24 PM  Result Value Ref Range   Glucose-Capillary 127 (H) 65 - 99 mg/dL  Glucose, capillary     Status: Abnormal   Collection Time: 02/25/15  9:08 PM  Result Value Ref Range   Glucose-Capillary 132 (H) 65 - 99 mg/dL  Glucose, capillary     Status: Abnormal   Collection Time: 02/26/15  6:49 AM  Result Value Ref Range   Glucose-Capillary 163 (H) 65 - 99 mg/dL  Glucose, capillary     Status: Abnormal   Collection Time: 02/26/15 12:29 PM  Result Value Ref Range   Glucose-Capillary 101 (H) 65 - 99 mg/dL  Glucose, capillary     Status: None   Collection Time: 02/26/15  4:27 PM  Result Value Ref Range   Glucose-Capillary 94 65 - 99 mg/dL  Glucose, capillary     Status: Abnormal   Collection Time: 02/26/15  9:25 PM  Result Value Ref Range   Glucose-Capillary 143 (H) 65 - 99 mg/dL   Comment 1 Notify RN   Glucose, capillary     Status: Abnormal   Collection Time: 02/27/15  6:54 AM  Result Value Ref Range   Glucose-Capillary 116 (H) 65 - 99 mg/dL  Glucose, capillary     Status: Abnormal   Collection Time: 02/27/15 11:08 AM  Result Value Ref Range   Glucose-Capillary 171 (H) 65 - 99 mg/dL  Glucose, capillary     Status: Abnormal   Collection Time: 02/27/15  4:44 PM  Result Value Ref Range   Glucose-Capillary 120 (H) 65 - 99 mg/dL  Glucose, capillary     Status: None   Collection Time: 02/27/15  9:24 PM  Result Value Ref Range   Glucose-Capillary 94 65 - 99 mg/dL  Glucose, capillary     Status: None   Collection Time: 02/28/15  6:52 AM  Result Value Ref Range   Glucose-Capillary 95 65 - 99 mg/dL     HEENT: normal Cardio: RRR Resp: CTA B/L and unlabored GI: BS positive and nontender nondistended Extremity:  Pulses positive and No Edema Skin:   Intact Neuro: Alert/Oriented Musc/Skel:  Other Left BKA with Coban wrap---minimal tenderness with manipulation of limb Gen. no acute distress   Assessment/Plan: 1. Functional deficits secondary to  L-BKA which require 3+ hours per day of interdisciplinary therapy in a comprehensive inpatient rehab setting. Physiatrist is providing close team supervision and 24 hour management of active medical problems listed below. Physiatrist and rehab team continue to assess barriers to discharge/monitor patient progress toward  functional and medical goals. Team conference today please see physician documentation under team conference tab, met with team face-to-face to discuss problems,progress, and goals. Formulized individual treatment plan based on medical history, underlying problem and comorbidities. FIM: FIM - Bathing Bathing Steps Patient Completed: Chest, Right Arm, Left Arm, Abdomen, Front perineal area, Buttocks, Right upper leg, Left upper leg, Right lower leg (including foot) Bathing: 6: More than reasonable amount of time  FIM - Upper Body Dressing/Undressing Upper body dressing/undressing steps patient completed: Thread/unthread right sleeve  of pullover shirt/dresss, Thread/unthread left sleeve of pullover shirt/dress, Put head through opening of pull over shirt/dress, Pull shirt over trunk Upper body dressing/undressing: 5: Set-up assist to: Obtain clothing/put away FIM - Lower Body Dressing/Undressing Lower body dressing/undressing steps patient completed: Thread/unthread right pants leg, Thread/unthread left pants leg, Pull pants up/down, Don/Doff right shoe, Fasten/unfasten right shoe, Don/Doff right sock Lower body dressing/undressing: 5: Set-up assist to: Obtain clothing  FIM - Toileting Toileting steps completed by patient: Adjust clothing prior to toileting, Performs perineal hygiene, Adjust clothing after toileting Toileting Assistive Devices: Grab bar or rail for support Toileting: 5: Supervision: Safety issues/verbal cues  FIM - Radio producer Devices: Environmental consultant, Product manager Transfers: 5-To toilet/BSC: Supervision (verbal cues/safety issues), 5-From toilet/BSC: Supervision (verbal cues/safety issues)  FIM - Control and instrumentation engineer Devices: Environmental consultant, Arm rests Bed/Chair Transfer: 5: Bed > Chair or W/C: Supervision (verbal cues/safety issues), 5: Chair or W/C > Bed: Supervision (verbal cues/safety issues)  FIM - Locomotion: Wheelchair Distance: 200 Locomotion: Wheelchair: 5: Travels 150 ft or more: maneuvers on rugs and over door sills with supervision, cueing or coaxing FIM - Locomotion: Ambulation Locomotion: Ambulation Assistive Devices: Administrator Ambulation/Gait Assistance: 5: Supervision Locomotion: Ambulation: 5: Travels 150 ft or more with supervision/safety issues  Comprehension Comprehension Mode: Auditory Comprehension: 5-Understands basic 90% of the time/requires cueing < 10% of the time  Expression Expression Mode: Verbal Expression: 5-Expresses basic needs/ideas: With no assist  Social Interaction Social Interaction: 7-Interacts  appropriately with others - No medications needed.  Problem Solving Problem Solving: 5-Solves basic 90% of the time/requires cueing < 10% of the time  Memory Memory: 7-Complete Independence: No helper  Medical Problem List and Plan: 1. Functional deficits secondary to L-BKA 2. DVT Prophylaxis/Anticoagulation: Pharmaceutical: Lovenox 3. Pain Management: Continue oxycodone prn. phantom pain, gabapentin 4. Mood: Continue Zoloft. LCSW to follow for evaluation and support.  5. Neuropsych: This patient is capable of making decisions on his own behalf. 6. Skin/Wound Care: Continue compressive dressing. Routine pressure relief measures.   -removed dressing---change dressing daily moving forward with ACE 7. Fluids/Electrolytes/Nutrition: Monitor I/O. Off supplements if intake poor.   8. DM type 2:   Continue glucotrol and lantus as per home regiment.   -reasonable control overall 9. Dehydration: Push po fluids.    10. Pancrease/Renal transplant: On Prograf bid and Cellcept bid. 11. HTN: Will monitor BP every 8 hours. Continue Norvasc and minipress daily LOS (Days) 6 A FACE TO FACE EVALUATION WAS PERFORMED  SWARTZ,ZACHARY T 02/28/2015, 8:34 AM

## 2015-03-01 ENCOUNTER — Inpatient Hospital Stay (HOSPITAL_COMMUNITY): Payer: Medicare Other

## 2015-03-01 ENCOUNTER — Inpatient Hospital Stay (HOSPITAL_COMMUNITY): Payer: Medicare Other | Admitting: Physical Therapy

## 2015-03-01 LAB — GLUCOSE, CAPILLARY
Glucose-Capillary: 130 mg/dL — ABNORMAL HIGH (ref 65–99)
Glucose-Capillary: 96 mg/dL (ref 65–99)
Glucose-Capillary: 88 mg/dL (ref 65–99)
Glucose-Capillary: 93 mg/dL (ref 65–99)

## 2015-03-01 LAB — CREATININE, SERUM
Creatinine, Ser: 1.36 mg/dL — ABNORMAL HIGH (ref 0.61–1.24)
GFR calc Af Amer: >60 mL/min (ref >60)
GFR calc non Af Amer: 54 mL/min — ABNORMAL LOW (ref >60)

## 2015-03-01 NOTE — Progress Notes (Signed)
Occupational Therapy Session Note  Patient Details  Name: Ascencion DikeClarence Bisono MRN: [id number] Date of Birth: 1951/05/07  Today's Date: 03/01/2015 OT Individual Time: 0730-0830 OT Individual Time Calculation (min): 60 min   Short Term Goals: Week 1:  OT Short Term Goal 1 (Week 1): Pt. will stand for grooming at sink with SBA OT Short Term Goal 2 (Week 1): Pt. will bathe at SBA level OT Short Term Goal 3 (Week 1): Pt will dress with SBA and sit to stand OT Short Term Goal 4 (Week 1): Pt will transfer to toilet with SBA OT Short Term Goal 5 (Week 1): Pt. will transfer to shower with SBA  Skilled Therapeutic Interventions/Progress Updates: ADL-retraining at shower level with focus on improved transfsers, dynamic standing balance, and discharge planning.   Pt continues to improve endurance and confidence with use of RW for mobility within his room.    Pt able to ambulate to shower, complete transfer, bathe and dress, sitting and standing with only standby assist.   No LOB noted during entire session and pt verbalized positive response from treatment with awareness of his improved performance in self-care.   Pt left in w/c at end of session with RN providing meds.     Therapy Documentation Precautions:  Precautions Precautions: Fall Restrictions Weight Bearing Restrictions: Yes LLE Weight Bearing: Non weight bearing Other Position/Activity Restrictions: L LE   Vital Signs: Therapy Vitals Temp: 98.3 F (36.8 C) Temp Source: Oral Pulse Rate: 84 Resp: 18 BP: (!) 121/51 mmHg Patient Position (if appropriate): Lying Oxygen Therapy SpO2: 99 % O2 Device: Not Delivered   Pain: Pain Assessment Pain Assessment: No/denies pain    See FIM for current functional status  Therapy/Group: Individual Therapy   Second session: Time: 1100-1200 Time Calculation (min):  60 min  Pain Assessment: No/denies pain  Skilled Therapeutic Interventions: Therapeutic activity (45 min) with focus on dynamic  standing balance during KITCHEN task, functional mobility using RW on uneven terrain (outdoors, concrete, sloped walkway), and safety awareness.   Pt rejected meal prep task d/t inadequate ingredients and was unwilling to substitute his concept of a full-meal (chicken dinner with rice and vegetables)  versus simple meal suggested (sandwich, eggs, soup or macaroni and cheese meal).   Pt was re-educated on intent of activity to challenge balance and problem-solve in prep for d/c home to resume his role in meal prep.    Pt demonstrated ability to retrieve items from cabinets and acknowledged suggestion to remain at w/c level while completing food prep as well as delegating responsibilities to other family members, present during meal times.      Pt was then escorted outdoors and ambulated approx 100' using RW on uneven terrain demonstrating LOB 1 time d/t inattention to drain in concrete walkway.    Therex (15 min) with focus on improved endurance.   Pt completed 10 min at Sci-Fit (UE ergometry), level 4, resting 2 times, approx 2 minutes each time,  to complete task.    Pt was advised to reduce his rate of exercise from 60 rpm to 30-40 range to improve endurance, which allowed pt to continue participation rather than terminate again d/t fatigue.      See FIM for current functional status  Therapy/Group: Individual Therapy  Aiyanna Awtrey 03/01/2015, 8:28 AM

## 2015-03-01 NOTE — Progress Notes (Signed)
Physical Therapy Weekly Progress Note  Patient Details  Name: [name] MRN: [id number] Date of Birth: Aug 02, 1951  Beginning of progress report period: Feb 22, 2015 End of progress report period: March 01, 2015  Today's Date: 03/01/2015 PT Individual Time:  -  [phone] Treatment Session 2: 1500-1530 Treatment Time: 60 min Treatment Session 2: 30 min     Patient has met 5 of 5 short term goals.    Patient continues to demonstrate the following deficits: impaired standing balance with & without AD, low activity tolerance, phantom limb pain, impaired safety awareness, residual limb weakness and inhibited muscle activity from pain and therefore will continue to benefit from skilled PT intervention to enhance overall performance with activity tolerance, balance, ability to compensate for deficits, functional use of  left lower extremity, awareness and coordination.  Patient progressing toward long term goals..  Continue plan of care.  PT Short Term Goals Week 1:  PT Short Term Goal 1 (Week 1): Patient wil decrease L Knee flexion contracture to -15 of extension. PT Short Term Goal 1 - Progress (Week 1): Met PT Short Term Goal 2 (Week 1): Patient will increase ability to transfer sit to stand with Supervison PT Short Term Goal 2 - Progress (Week 1): Met PT Short Term Goal 3 (Week 1): Patient will increase activity tolerance to be able to stand for up to 10 min with activities PT Short Term Goal 3 - Progress (Week 1): Met PT Short Term Goal 4 (Week 1): Patient will be able to ambulate with FWW on a distance of 60 feet with Supervision PT Short Term Goal 5 (Week 1): Patient will be able to [facility] up and down 2 steps with min A PT Short Term Goal 5 - Progress (Week 1): Met Week 2:  PT Short Term Goal 1 (Week 2): STGs = LTGs due to ELOS  Skilled Therapeutic Interventions/Progress Updates:    Treatment Session 1: Gait Training: PT instructs pt in ambulation with RW x 190' req SBA - one small  LOB when pt's supporting foot stubs the ground during swing, but pt self corrects.  PT instructs pt in ascending/descending 4 steps with B rails req min A and verbal cues for technique.  PT simulates home entry method using 6" high curb step with 4" high curb step on top and instructs pt in backwards approach with chair at 90 degree angle on first curb, so pt can sit and PT can bring RW onto the first curb, then once pt stands, PT removes chair and pt completes a backwards approach onto 2nd curb and rests on elevated mat (simulated chair height) - all req min A to "get in the house" and min A to get out.  PT measures pt's L knee extension and notes it is 8 degrees short of neutral.   Treatment Session 2: Neuromuscular Reeducation: PT instructs pt in prolonged dynamic standing activity x 20 minutes without seated rest break req CGA, progressing to SBA, while setting up and playing a checkers game. No LOB.   Pt is progressing with physical therapy and a safe technique to enter the home has been established. Wife needs to be trained with this. Pt agrees to contact wife and arrange for family training on Saturday or Monday. Continue per PT POC.    Therapy Documentation Precautions:  Precautions Precautions: Fall Restrictions Weight Bearing Restrictions: Yes LLE Weight Bearing: Non weight bearing Other Position/Activity Restrictions: L LE Pain: Pain Assessment Pain Assessment: No/denies pain  Treatment Session  2: Pt denies pain.   See FIM for current functional status  Therapy/Group: Individual Therapy  [name] M 03/01/2015, 8:33 AM

## 2015-03-01 NOTE — Progress Notes (Signed)
64 y.o. male with history of DM type 2 with retinopathy, s/p renal-pancreas transplant, PTSD, PVD with left foot infection and failed attempts at limb salvage. 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 Sharol Given. Post op limited by pain as well as weakness affecting mobility  Subjective/Complaints: Phantom limb pain at times. No drainage at wound site.  Pt denies fever, rash/itching, headache, blurred or double vision, nausea, vomiting, abdominal pain, diarrhea, chest pain, shortness of breath, palpitations, dysuria, dizziness, neck or back pain, bleeding, anxiety, or depression  Objective: Vital Signs: Blood pressure 121/51, pulse 84, temperature 98.3 F (36.8 C), temperature source Oral, resp. rate 18, height $RemoveBe'5\' 10"'coqXxUjTi$  (1.778 m), weight 76.204 kg (168 lb), SpO2 99 %. No results found. Results for orders placed or performed during the hospital encounter of 02/22/15 (from the past 72 hour(s))  Glucose, capillary     Status: Abnormal   Collection Time: 02/26/15 12:29 PM  Result Value Ref Range   Glucose-Capillary 101 (H) 65 - 99 mg/dL  Glucose, capillary     Status: None   Collection Time: 02/26/15  4:27 PM  Result Value Ref Range   Glucose-Capillary 94 65 - 99 mg/dL  Glucose, capillary     Status: Abnormal   Collection Time: 02/26/15  9:25 PM  Result Value Ref Range   Glucose-Capillary 143 (H) 65 - 99 mg/dL   Comment 1 Notify RN   Glucose, capillary     Status: Abnormal   Collection Time: 02/27/15  6:54 AM  Result Value Ref Range   Glucose-Capillary 116 (H) 65 - 99 mg/dL  Glucose, capillary     Status: Abnormal   Collection Time: 02/27/15 11:08 AM  Result Value Ref Range   Glucose-Capillary 171 (H) 65 - 99 mg/dL  Glucose, capillary     Status: Abnormal   Collection Time: 02/27/15  4:44 PM  Result Value Ref Range   Glucose-Capillary 120 (H) 65 - 99 mg/dL  Glucose, capillary     Status: None   Collection Time: 02/27/15  9:24 PM  Result Value Ref Range   Glucose-Capillary 94 65 - 99  mg/dL  Glucose, capillary     Status: None   Collection Time: 02/28/15  6:52 AM  Result Value Ref Range   Glucose-Capillary 95 65 - 99 mg/dL  Glucose, capillary     Status: Abnormal   Collection Time: 02/28/15 12:01 PM  Result Value Ref Range   Glucose-Capillary 176 (H) 65 - 99 mg/dL  Glucose, capillary     Status: Abnormal   Collection Time: 02/28/15  4:10 PM  Result Value Ref Range   Glucose-Capillary 137 (H) 65 - 99 mg/dL  Glucose, capillary     Status: Abnormal   Collection Time: 02/28/15  9:35 PM  Result Value Ref Range   Glucose-Capillary 120 (H) 65 - 99 mg/dL  Glucose, capillary     Status: Abnormal   Collection Time: 03/01/15  6:42 AM  Result Value Ref Range   Glucose-Capillary 130 (H) 65 - 99 mg/dL   Comment 1 Notify RN   Creatinine, serum     Status: Abnormal   Collection Time: 03/01/15  6:48 AM  Result Value Ref Range   Creatinine, Ser 1.36 (H) 0.61 - 1.24 mg/dL   GFR calc non Af Amer 54 (L) >60 mL/min   GFR calc Af Amer >60 >60 mL/min    Comment: (NOTE) The eGFR has been calculated using the CKD EPI equation. This calculation has not been validated in all clinical  situations. eGFR's persistently <60 mL/min signify possible Chronic Kidney Disease.      HEENT: normal Cardio: RRR Resp: CTA B/L and unlabored, no wheezing/rales GI: BS positive and nontender nondistended Extremity:  Pulses positive and No Edema Skin:   Incision clean and dry.  Neuro: Alert/Oriented Musc/Skel:  Other Left stump well shaped Gen. no acute distress   Assessment/Plan: 1. Functional deficits secondary to  L-BKA which require 3+ hours per day of interdisciplinary therapy in a comprehensive inpatient rehab setting. Physiatrist is providing close team supervision and 24 hour management of active medical problems listed below. Physiatrist and rehab team continue to assess barriers to discharge/monitor patient progress toward functional and medical goals. Team conference today please see  physician documentation under team conference tab, met with team face-to-face to discuss problems,progress, and goals. Formulized individual treatment plan based on medical history, underlying problem and comorbidities. FIM: FIM - Bathing Bathing Steps Patient Completed: Chest, Right Arm, Left Arm, Abdomen, Front perineal area, Buttocks, Right upper leg, Left upper leg, Right lower leg (including foot) Bathing: 6: More than reasonable amount of time  FIM - Upper Body Dressing/Undressing Upper body dressing/undressing steps patient completed: Thread/unthread right sleeve of pullover shirt/dresss, Thread/unthread left sleeve of pullover shirt/dress, Put head through opening of pull over shirt/dress, Pull shirt over trunk Upper body dressing/undressing: 5: Set-up assist to: Obtain clothing/put away FIM - Lower Body Dressing/Undressing Lower body dressing/undressing steps patient completed: Thread/unthread right pants leg, Thread/unthread left pants leg, Pull pants up/down, Don/Doff right shoe, Fasten/unfasten right shoe, Don/Doff right sock Lower body dressing/undressing: 5: Set-up assist to: Obtain clothing  FIM - Toileting Toileting steps completed by patient: Adjust clothing prior to toileting, Performs perineal hygiene, Adjust clothing after toileting Toileting Assistive Devices: Grab bar or rail for support Toileting: 5: Supervision: Safety issues/verbal cues  FIM - Radio producer Devices: Environmental consultant, Product manager Transfers: 5-To toilet/BSC: Supervision (verbal cues/safety issues), 5-From toilet/BSC: Supervision (verbal cues/safety issues)  FIM - Control and instrumentation engineer Devices: Arm rests Bed/Chair Transfer: 6: Sit > Supine: No assist, 6: Supine > Sit: No assist, 5: Bed > Chair or W/C: Supervision (verbal cues/safety issues), 5: Chair or W/C > Bed: Supervision (verbal cues/safety issues)  FIM - Locomotion: Wheelchair Distance:  150 Locomotion: Wheelchair: 6: Travels 150 ft or more, turns around, maneuvers to table, bed or toilet, negotiates 3% grade: maneuvers on rugs and over door sills independently FIM - Locomotion: Ambulation Locomotion: Ambulation Assistive Devices: Administrator Ambulation/Gait Assistance: 5: Supervision Locomotion: Ambulation: 2: Travels 50 - 149 ft with supervision/safety issues  Comprehension Comprehension Mode: Auditory Comprehension: 5-Follows basic conversation/direction: With no assist  Expression Expression Mode: Verbal Expression: 5-Expresses basic needs/ideas: With no assist  Social Interaction Social Interaction: 7-Interacts appropriately with others - No medications needed.  Problem Solving Problem Solving: 5-Solves basic 90% of the time/requires cueing < 10% of the time  Memory Memory: 5-Recognizes or recalls 90% of the time/requires cueing < 10% of the time  Medical Problem List and Plan: 1. Functional deficits secondary to L-BKA 2. DVT Prophylaxis/Anticoagulation: Pharmaceutical: Lovenox 3. Pain Management: Continue oxycodone prn. phantom pain, gabapentin fairly effective 4. Mood: Continue Zoloft. LCSW to follow for evaluation and support.  5. Neuropsych: This patient is capable of making decisions on his own behalf. 6. Skin/Wound Care: Continue compressive dressing. Routine pressure relief measures.   -daily dressing changes ordered 7. Fluids/Electrolytes/Nutrition: Monitor I/O. Off supplements if intake poor.   8. DM type 2:   Continue glucotrol  and lantus as per home regiment.   -reasonable control overall 9. Dehydration: Push po fluids.    10. Pancrease/Renal transplant: On Prograf bid and Cellcept bid. 11. HTN:  Continue Norvasc and minipress daily. Pressures have improved LOS (Days) 7 A FACE TO FACE EVALUATION WAS PERFORMED  Shloima Clinch T 03/01/2015, 8:14 AM

## 2015-03-02 ENCOUNTER — Inpatient Hospital Stay (HOSPITAL_COMMUNITY): Payer: Medicare Other | Admitting: Physical Therapy

## 2015-03-02 LAB — GLUCOSE, CAPILLARY
Glucose-Capillary: 190 mg/dL — ABNORMAL HIGH (ref 65–99)
Glucose-Capillary: 73 mg/dL (ref 65–99)
Glucose-Capillary: 218 mg/dL — ABNORMAL HIGH (ref 65–99)
Glucose-Capillary: 72 mg/dL (ref 65–99)

## 2015-03-02 NOTE — Progress Notes (Signed)
  64 y.o. male with history of DM type 2 with retinopathy, s/p renal-pancreas transplant, PTSD, PVD with left foot infection and failed attempts at limb salvage. 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 Lajoyce Cornersuda. Post op limited by pain as well as weakness affecting mobility  Subjective/Complaints: Patient feels well. Has no complaints. Denies significant pain.  Objective: Vital Signs: Blood pressure 130/74, pulse 97, temperature 98.1 F (36.7 C), temperature source Oral, resp. rate 18, height 5\' 10"  (1.778 m), weight 168 lb (76.204 kg), SpO2 100 %. No results found.   No acute distress. Chest clear to auscultation. Cardiac exam S1 and S2 are regular. Abdominal exam active bowel sounds, soft. Extremities no significant edema (left BKA) Assessment/Plan: 1. Functional deficits secondary to  L-BKA   Medical Problem List and Plan: 1. Functional deficits secondary to L-BKA 2. DVT Prophylaxis/Anticoagulation: Pharmaceutical: Lovenox 3. Pain Management: Continue oxycodone prn. phantom pain, gabapentin 4. Mood: Continue Zoloft. LCSW to follow for evaluation and support.  5. Neuropsych: This patient is capable of making decisions on his own behalf. 6. Skin/Wound Care: Continue compressive dressing. Routine pressure relief measures.   -removed dressing---change dressing daily moving forward with ACE 7. Fluids/Electrolytes/Nutrition:  Basic Metabolic Panel:    Component Value Date/Time   NA 137 02/23/2015 0515   K 4.6 02/23/2015 0515   CL 102 02/23/2015 0515   CO2 28 02/23/2015 0515   BUN 12 02/23/2015 0515   CREATININE 1.36* 03/01/2015 0648   GLUCOSE 97 02/23/2015 0515   CALCIUM 9.3 02/23/2015 0515    8. DM type 2:   Continue glucotrol and lantus as per home regiment.    CBG (last 3)   Recent Labs  03/01/15 1657 03/01/15 2133 03/02/15 0650  GLUCAP 88 93 190*     9. Dehydration: improved.    10. Pancrease/Renal transplant: On Prograf bid and Cellcept bid. 11. HTN:  Will monitor BP every 8 hours. Continue Norvasc and minipress daily LOS (Days) 8 A FACE TO FACE EVALUATION WAS PERFORMED  SWORDS,BRUCE HENRY 03/02/2015, 9:49 AM

## 2015-03-03 ENCOUNTER — Inpatient Hospital Stay (HOSPITAL_COMMUNITY): Payer: Medicare Other | Admitting: Occupational Therapy

## 2015-03-03 LAB — GLUCOSE, CAPILLARY
Glucose-Capillary: 120 mg/dL — ABNORMAL HIGH (ref 65–99)
Glucose-Capillary: 188 mg/dL — ABNORMAL HIGH (ref 65–99)
Glucose-Capillary: 116 mg/dL — ABNORMAL HIGH (ref 65–99)
Glucose-Capillary: 201 mg/dL — ABNORMAL HIGH (ref 65–99)

## 2015-03-03 NOTE — Progress Notes (Signed)
Occupational Therapy Session Note  Patient Details  Name: [name] MRN: [id number] Date of Birth: 1951-05-25  Today's Date: 03/03/2015 OT Individual Time: 1430-1500 OT Individual Time Calculation (min): 30 min    Short Term Goals: Week 1:  OT Short Term Goal 1 (Week 1): Pt. will stand for grooming at sink with SBA OT Short Term Goal 2 (Week 1): Pt. will bathe at SBA level OT Short Term Goal 3 (Week 1): Pt will dress with SBA and sit to stand OT Short Term Goal 4 (Week 1): Pt will transfer to toilet with SBA OT Short Term Goal 5 (Week 1): Pt. will transfer to shower with SBA     Skilled Therapeutic Interventions/Progress Updates:    Pt seen this session for tub bench transfer training with RW to prepare for going home in 2 days. Pt received in room in w/c, propelled self from room to ADL apt over 150 ft. Once in apt, used RW to ambulate into bathroom and transferred in and out of tub with tub bench with S. Ambulated back to w/c with S. Pt very talkative this afternoon expressing his frustration with a family situation. He was able to propel w/c back to his room. Pt states he and his wife are ready for the planned discharge on Tuesday.  Pt in room with all needs met.  Therapy Documentation Precautions:  Precautions Precautions: Fall Restrictions Weight Bearing Restrictions: Yes LLE Weight Bearing: Non weight bearing Other Position/Activity Restrictions: L LE    Vital Signs: Therapy Vitals Temp: 98 F (36.7 C) Temp Source: Oral Pulse Rate: 92 Resp: 20 BP: 116/62 mmHg Patient Position (if appropriate): Sitting Oxygen Therapy SpO2: 100 % O2 Device: Not Delivered Pain: Pain Assessment Pain Assessment: No/denies pain ADL:  See FIM for current functional status  Therapy/Group: Individual Therapy  [place] 03/03/2015, 4:13 PM

## 2015-03-03 NOTE — Progress Notes (Signed)
  Subjective/Complaints:  Generally feels well. No specific complaints. He would like [place] outside and get some fresh air.  Objective: Vital Signs: Blood pressure 126/56, pulse 88, temperature 99.3 F (37.4 C), temperature source Oral, resp. rate 18, height 5\' 10"  (1.778 m), weight 168 lb (76.204 kg), SpO2 98 %. No results found.   No acute distress. Chest clear to auscultation. Cardiac exam S1 and S2 are regular. Abdominal exam active bowel sounds, soft. Extremities no significant edema (left BKA) Assessment/Plan: 1. Functional deficits secondary to  L-BKA   Medical Problem List and Plan: 1. Functional deficits secondary to L-BKA 2. DVT Prophylaxis/Anticoagulation: Pharmaceutical: Lovenox 3. Pain Management: Continue oxycodone prn. phantom pain, gabapentin 4. Mood: Continue Zoloft. LCSW to follow for evaluation and support.  5. Neuropsych: This patient is capable of making decisions on his own behalf. 6. Skin/Wound Care: Continue compressive dressing. Routine pressure relief measures.   -removed dressing---change dressing daily moving forward with ACE 7. Fluids/Electrolytes/Nutrition:  Basic Metabolic Panel:    Component Value Date/Time   NA 137 02/23/2015 0515   K 4.6 02/23/2015 0515   CL 102 02/23/2015 0515   CO2 28 02/23/2015 0515   BUN 12 02/23/2015 0515   CREATININE 1.36* 03/01/2015 0648   GLUCOSE 97 02/23/2015 0515   CALCIUM 9.3 02/23/2015 0515    8. DM type 2:   Continue glucotrol and lantus as per home regiment.    CBG (last 3)   Recent Labs  03/02/15 1713 03/02/15 2037 03/03/15 0651  GLUCAP 218* 72 120*     9. Dehydration: improved.    10. Pancrease/Renal transplant: On Prograf bid and Cellcept bid. 11. HTN: Will monitor BP every 8 hours. Continue Norvasc and minipress daily  Date past provided for today. LOS (Days) 9 A FACE TO FACE EVALUATION WAS PERFORMED  [name] 03/03/2015, 9:02 AM

## 2015-03-04 ENCOUNTER — Inpatient Hospital Stay (HOSPITAL_COMMUNITY): Payer: Medicare Other

## 2015-03-04 ENCOUNTER — Inpatient Hospital Stay (HOSPITAL_COMMUNITY): Payer: Medicare Other | Admitting: Physical Therapy

## 2015-03-04 LAB — GLUCOSE, CAPILLARY
Glucose-Capillary: 148 mg/dL — ABNORMAL HIGH (ref 65–99)
Glucose-Capillary: 152 mg/dL — ABNORMAL HIGH (ref 65–99)
Glucose-Capillary: 109 mg/dL — ABNORMAL HIGH (ref 65–99)
Glucose-Capillary: 180 mg/dL — ABNORMAL HIGH (ref 65–99)

## 2015-03-04 NOTE — Progress Notes (Signed)
Physical Therapy Session Note  Patient Details  Name: [name] MRN: [id number] Date of Birth: 1951-05-16  Today's Date: 03/04/2015 PT Individual Time: 1000-1100 PT Individual Time Calculation (min): 60 min   Short Term Goals: Week 1:  PT Short Term Goal 1 (Week 1): Patient wil decrease L Knee flexion contracture to -15 of extension. PT Short Term Goal 1 - Progress (Week 1): Met PT Short Term Goal 2 (Week 1): Patient will increase ability to transfer sit to stand with Supervison PT Short Term Goal 2 - Progress (Week 1): Met PT Short Term Goal 3 (Week 1): Patient will increase activity tolerance to be able to stand for up to 10 min with activities PT Short Term Goal 3 - Progress (Week 1): Met PT Short Term Goal 4 (Week 1): Patient will be able to ambulate with FWW on a distance of 60 feet with Supervision PT Short Term Goal 5 (Week 1): Patient will be able to [facility] up and down 2 steps with min A PT Short Term Goal 5 - Progress (Week 1): Met Week 2:  PT Short Term Goal 1 (Week 2): STGs = LTGs due to ELOS  Skilled Therapeutic Interventions/Progress Updates:   Focus of session on family education and training for D/C home tomorrow.  Pt's wife unable to attend today's session but will plan therapy session tomorrow late in pm when wife arrives to transport pt home in order to practice car transfers, w/c management, hopping up 1 step to enter their home and w/c bumping up/down one step.  Family present today are patient's sister and brother who may be spending time with pt at D/C.  Focus of therapy session on re-assessment of pt's strength, ROM, sensation and balance, w/c mobility and parts management, transfer training w/c <> bed squat pivot, bed mobility on flat bed, w/c <> simulated mini-van stand pivot transfer, gait with RW with S, one step negotiation with RW and w/c bumping up/down one step.  Pt family did not feel comfortable assisting with hopping up/down one step but brother did return  demonstrate w/c bumping.  Sister verbalized understanding but would need assistance to perform.  Discussed equipment, f/u therapy, preparation for prosthesis, and falls risk/safety with transfers due to phantom limb sensation with pt and family.  No further questions or concerns from pt family.     Therapy Documentation Precautions:  Precautions Precautions: Fall Restrictions Weight Bearing Restrictions: Yes LLE Weight Bearing: Non weight bearing Other Position/Activity Restrictions: L LE Pain: Pain Assessment Pain Assessment: No/denies pain Pain Score: 8  Pain Type: Phantom pain Pain Location: Leg Pain Orientation: Left Pain Radiating Towards: foot Pain Descriptors / Indicators: Aching Pain Frequency: Occasional Pain Onset: Gradual Patients Stated Pain Goal: 3 Pain Intervention(s): Medication (See eMAR) Mobility: Bed Mobility Bed Mobility: Supine to Sit;Sit to Supine Supine to Sit: 6: Modified independent (Device/Increase time) Sit to Supine: 6: Modified independent (Device/Increase time) Transfers Transfers: Yes Stand Pivot Transfers: 5: Supervision Stand Pivot Transfer Details (indicate cue type and reason): Performed stand pivot transfer w/c <> simulated mini-van with pt requiring supervision and verbal cues for safe set up of w/c to prepare for transfer. Squat Pivot Transfers: 5: Supervision Squat Pivot Transfer Details (indicate cue type and reason): Performed squat pivot transfers w/c <> elevated mat to simulate bed at home with supervision and verbal cues for safe set up of w/c to prepare for transfer Locomotion : Ambulation Ambulation/Gait Assistance: 5: Supervision Ambulation Distance (Feet): 150 Feet Assistive device: Rolling walker Stairs /  Additional Locomotion Stairs: Yes Stairs Assistance: 4: Min assist Stairs Assistance Details (indicate cue type and reason): Pt performs hopping up 8" step + 4" step backwards with RW to ascend porch and then into house;  performs hopping forwards to descend.  Pt requires min A for balance and verbal cues for safe sequencing when hopping and to balance when placing RW on porch or down on ground Stair Management Technique: No rails;Backwards;Forwards;With walker Number of Stairs: 1 (x 4 reps) Height of Stairs: 8 Wheelchair Mobility Wheelchair Assistance: 6: Modified independent (Device/Increase time) Environmental health practitioner: Both upper extremities Wheelchair Parts Management: Supervision/cueing Distance: 150   See FIM for current functional status  Therapy/Group: Individual Therapy  Raylene Everts [facility] 03/04/2015, 12:31 PM

## 2015-03-04 NOTE — Progress Notes (Signed)
Occupational Therapy Session Note  Patient Details  Name: [name] MRN: [id number] Date of Birth: Nov 16, 1950  Today's Date: 03/04/2015 OT Individual Time: 0830-0930 OT Individual Time Calculation (min): 60 min   Short Term Goals: Week 1:  OT Short Term Goal 1 (Week 1): Pt. will stand for grooming at sink with SBA OT Short Term Goal 2 (Week 1): Pt. will bathe at SBA level OT Short Term Goal 3 (Week 1): Pt will dress with SBA and sit to stand OT Short Term Goal 4 (Week 1): Pt will transfer to toilet with SBA OT Short Term Goal 5 (Week 1): Pt. will transfer to shower with SBA  Skilled Therapeutic Interventions/Progress Updates: ADL retraining with focus on energy conservation, discharge planning, and supported standing balance.    Pt received in his w/c awaiting therapist and prepared for graduation day activities, demonstrating proficiencies with transfers, bathing, dressing, toileting, and grooming.    Pt completed BADL with distant supervision using RW, tub bench, and w/c as previously instructed without incident or need for cues.   No LOB or SOB noted during this session.   [facility] [place] [place] [facility]  to request tub bench and hand shower.    Pt retains prime care service support through [facility]) and reports familiarity with accessing additional resources for f/u through same provider, as needed.       Therapy Documentation Precautions:  Precautions Precautions: Fall Restrictions Weight Bearing Restrictions: Yes LLE Weight Bearing: Non weight bearing Other Position/Activity Restrictions: L LE  Vital Signs: Therapy Vitals Temp: 98.5 F (36.9 C) Temp Source: Oral Pulse Rate: 85 Resp: 18 BP: (!) 127/52 mmHg Patient Position (if appropriate): Lying Oxygen Therapy SpO2: 98 % O2 Device: Not Delivered   Pain: Pain Assessment Pain Assessment: No/denies pain Pain Score: 0-No pain  See FIM for current functional  status  Therapy/Group: Individual Therapy   Second session: Time: 1100-1200 Time Calculation (min):  60 min  Pain Assessment: No/denies pain  Skilled Therapeutic Interventions: Therapeutic activity with focus on family training (sister and brother-in-law) on transfer safety (supervision), and pt's performance with simple meal prep (deferred by pt) and safety concerns relating to dynamic standing balance.    With sister observing as supervision, pt performed sit>stand from w/c unassisted and ambulated to tub bench in standard tub at ADL apartment.   No LOB observed during session, pt returned to w/c with similar proficiency.    OT related to pt and his sister that primary concern is to reduce distractions and assist with transfers to tub bench (supervising for safety).    No further training with family required as pt is proficient with bathing/dressing tasks and uses support surfaces as needed when standing to pull up his pants.    Pt was escorted to gym after family training and completed 10 min of UE ergometry using Sci-Fit, level 2, without need for rest break this session d/t improved awareness of slowing his pace down as advised while exercising.   Following therex pt completed functional mobility outdoors using RW with min guard assist and vc to attend to slope of walkway to reduce risk for fall.    Pt returned to his room at end of session and remained in w/c awaiting noon meal.  See FIM for current functional status  Therapy/Group: Individual Therapy  [name] 03/04/2015, 9:29 AM

## 2015-03-04 NOTE — Progress Notes (Signed)
Occupational Therapy Discharge Summary  Patient Details  Name: [name] MRN: [id number] Date of Birth: 12/21/1950  Patient has met 9 of 10 long term goals due to improved activity tolerance, improved balance, ability to compensate for deficits and improved awareness.  Patient to discharge at overall Modified Independent level at w/c level, supervision for bathtub transfers.  Patient's care partner is independent to provide the necessary cognitive assistance at discharge to supervise bathtub transfers.    Reasons goals not met: Pt declined participation in simple meal prep d/t inadequate choice presented as alternative to full-meal pt wanted to complete instead.    Recommendation:  Patient may benefit from ongoing skilled OT services in outpatient setting to continue to advance functional skills in the area of iADL when appropriate with use of prescribed prosthesis.  Equipment: No equipment provided  Reasons for discharge: treatment goals met  Patient/family agrees with progress made and goals achieved: Yes  OT Discharge Precautions/Restrictions  Precautions Precautions: Fall Restrictions Weight Bearing Restrictions: Yes LLE Weight Bearing: Non weight bearing  Vital Signs Therapy Vitals Temp: 98.4 F (36.9 C) Temp Source: Oral Pulse Rate: 90 Resp: 18 BP: 119/72 mmHg Patient Position (if appropriate): Sitting Oxygen Therapy SpO2: 99 % O2 Device: Not Delivered  Pain Pain Assessment Pain Assessment: 0-10 Pain Score: 3  Pain Type: Phantom pain Pain Location: Leg Pain Orientation: Left Pain Radiating Towards: foot Pain Descriptors / Indicators: Throbbing Pain Onset: Progressive Patients Stated Pain Goal: 0 Pain Intervention(s): Medication (See eMAR)  ADL ADL ADL Comments: see FIM  Vision/Perception  Vision- History Baseline Vision/History: Legally blind (left eye) Patient Visual Report: No change from baseline Vision- Assessment Eye Alignment: Impaired  (comment) Perception Comments: impaired depth perception   Cognition Overall Cognitive Status: Within Functional Limits for tasks assessed Arousal/Alertness: Awake/alert Orientation Level: Oriented X4 Attention: Alternating;Selective Selective Attention: Appears intact Alternating Attention: Impaired Alternating Attention Impairment: Functional complex Memory: Appears intact Awareness: Appears intact Problem Solving: Appears intact Safety/Judgment: Appears intact  Sensation Sensation Light Touch: Impaired Detail Light Touch Impaired Details: Impaired LLE Stereognosis: Not tested Hot/Cold: Not tested Proprioception: Appears Intact Additional Comments: Numbness at L limb incision and reports of phantom limb sensation Coordination Gross Motor Movements are Fluid and Coordinated: Yes  Motor  Motor Motor: Within Functional Limits  Mobility  Bed Mobility Bed Mobility: Supine to Sit;Sit to Supine Supine to Sit: 6: Modified independent (Device/Increase time) Sit to Supine: 6: Modified independent (Device/Increase time)   Trunk/Postural Assessment  Cervical Assessment Cervical Assessment: Within Functional Limits Thoracic Assessment Thoracic Assessment: Within Functional Limits Lumbar Assessment Lumbar Assessment: Within Functional Limits Postural Control Postural Limitations: decreased excursions and balance reactions   Balance Balance Balance Assessed: Yes Static Sitting Balance Static Sitting - Balance Support: No upper extremity supported Static Sitting - Level of Assistance: 6: Modified independent (Device/Increase time) Dynamic Sitting Balance Dynamic Sitting - Level of Assistance: 6: Modified independent (Device/Increase time) Static Standing Balance Static Standing - Balance Support: Right upper extremity supported;Left upper extremity supported Static Standing - Level of Assistance: 5: Stand by assistance Dynamic Standing Balance Dynamic Standing - Balance  Support: Right upper extremity supported;Left upper extremity supported Dynamic Standing - Level of Assistance: 5: Stand by assistance  Extremity/Trunk Assessment RUE Assessment RUE Assessment: Within Functional Limits LUE Assessment LUE Assessment: Within Functional Limits  See FIM for current functional status  Nevaya Nagele 03/05/2015, 7:23 AM

## 2015-03-04 NOTE — Progress Notes (Signed)
Physical Therapy Discharge Summary  Patient Details  Name: [name] MRN: [id number] Date of Birth: August 14, 1951  Today's Date: 03/05/2015  Patient has met 11 of 11 long term goals due to improved activity tolerance, improved balance, improved postural control, increased strength, increased range of motion, decreased pain, improved attention and improved awareness.  Patient to discharge at wheelchair level of Mod I and ambulatory level of supervision with RW  level when wife present.   Patient's care partner is independent to provide the necessary physical, cognitive and supervision assistance at discharge.  Reasons goals not met: All goals met  Recommendation:  Patient will benefit from ongoing skilled PT services in home health setting and then transition to outpatient PT for prosthetic training and to continue to advance safe functional mobility, address ongoing impairments in UE and LE strength, ROM, postural control, balance, gait, and minimize fall risk.  Equipment: Pt issued RW on acute; manual w/c with bilat leg rests and amputee support pad  Reasons for discharge: treatment goals met and discharge from hospital  Patient/family agrees with progress made and goals achieved: Yes  PT Discharge Pain Pain Assessment Pain Assessment: No/denies pain Pain Score: 8  Pain Type: Phantom pain Pain Location: Leg Pain Orientation: Left Pain Radiating Towards: foot Pain Descriptors / Indicators: Aching Pain Frequency: Occasional Pain Onset: Gradual Patients Stated Pain Goal: 3 Pain Intervention(s): Medication (See eMAR) Vision/Perception   Visually impaired PTA  Cognition  Still presents with impaired recall of new information, impaired sustained and selective attention and impaired awareness of functional limitations  Sensation Sensation Light Touch: Impaired Detail Light Touch Impaired Details: Impaired LLE Stereognosis: Not tested Hot/Cold: Not tested Proprioception: Appears  Intact Additional Comments: Numbness at L limb incision and reports of phantom limb sensation Coordination Gross Motor Movements are Fluid and Coordinated: Yes Motor  Motor Motor: Within Functional Limits  Mobility Bed Mobility Bed Mobility: Supine to Sit;Sit to Supine Supine to Sit: 6: Modified independent (Device/Increase time) Sit to Supine: 6: Modified independent (Device/Increase time) Transfers Transfers: Yes Stand Pivot Transfers: 5: Supervision Stand Pivot Transfer Details (indicate cue type and reason): Performed stand pivot transfer w/c <> simulated mini-van with pt requiring supervision and verbal cues for safe set up of w/c to prepare for transfer. Squat Pivot Transfers: 5: Supervision Squat Pivot Transfer Details (indicate cue type and reason): Performed squat pivot transfers w/c <> elevated mat to simulate bed at home with supervision and verbal cues for safe set up of w/c to prepare for transfer Locomotion  Ambulation Ambulation/Gait Assistance: 5: Supervision Ambulation Distance (Feet): 150 Feet Assistive device: Rolling walker Stairs / Additional Locomotion Stairs: Yes Stairs Assistance: 4: Min assist Stairs Assistance Details (indicate cue type and reason): Pt performs hopping up 8" step + 4" step backwards with RW to ascend porch and then into house; performs hopping forwards to descend.  Pt requires min A for balance and verbal cues for safe sequencing when hopping and to balance when placing RW on porch or down on ground Stair Management Technique: No rails;Backwards;Forwards;With walker Number of Stairs: 1 (x 4 reps) Height of Stairs: 8 Wheelchair Mobility Wheelchair Assistance: 6: Modified independent (Device/Increase time) Environmental health practitioner: Both upper extremities Wheelchair Parts Management: Supervision/cueing Distance: 150  Trunk/Postural Assessment  Cervical Assessment Cervical Assessment: Within Functional Limits Thoracic Assessment Thoracic  Assessment: Within Functional Limits Lumbar Assessment Lumbar Assessment: Within Functional Limits Postural Control Postural Limitations: decreased excursions and balance reactions in standing; improved in sitting Balance Balance Balance Assessed: Yes Static Sitting Balance  Static Sitting - Balance Support: No upper extremity supported Static Sitting - Level of Assistance: 6: Modified independent (Device/Increase time) Dynamic Sitting Balance Dynamic Sitting - Level of Assistance: 6: Modified independent (Device/Increase time) Static Standing Balance Static Standing - Balance Support: Right upper extremity supported;Left upper extremity supported Static Standing - Level of Assistance: 5: Stand by assistance Dynamic Standing Balance Dynamic Standing - Balance Support: Right upper extremity supported;Left upper extremity supported Dynamic Standing - Level of Assistance: 5: Stand by assistance Extremity Assessment  RLE Assessment RLE Assessment: Within Functional Limits LLE Assessment LLE Assessment: Exceptions to WFL LLE AROM (degrees) Left Knee Extension: 0 Left Knee Flexion: 90 LLE Strength LLE Overall Strength Comments: 5/5 hip flexion, 4/5 hip extension, 3/5 knee flexion/extension  See FIM for current functional status  [name] 03/04/2015, 12:26 PM

## 2015-03-04 NOTE — Progress Notes (Signed)
64 y.o. male with history of DM type 2 with retinopathy, s/p renal-pancreas transplant, PTSD, PVD with left foot infection and failed attempts at limb salvage. 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 Lajoyce Cornersuda. Post op limited by pain as well as weakness affecting mobility  Subjective/Complaints: Good night. No new complaints. sleeping well. Pt denies nausea, vomiting, abdominal pain, diarrhea, chest pain, shortness of breath,   Objective: Vital Signs: Blood pressure 127/52, pulse 85, temperature 98.5 F (36.9 C), temperature source Oral, resp. rate 18, height 5\' 10"  (1.778 m), weight 76.204 kg (168 lb), SpO2 98 %. No results found. Results for orders placed or performed during the hospital encounter of 02/22/15 (from the past 72 hour(s))  Glucose, capillary     Status: None   Collection Time: 03/01/15 12:00 PM  Result Value Ref Range   Glucose-Capillary 96 65 - 99 mg/dL  Glucose, capillary     Status: None   Collection Time: 03/01/15  4:57 PM  Result Value Ref Range   Glucose-Capillary 88 65 - 99 mg/dL   Comment 1 Notify RN   Glucose, capillary     Status: None   Collection Time: 03/01/15  9:33 PM  Result Value Ref Range   Glucose-Capillary 93 65 - 99 mg/dL  Glucose, capillary     Status: Abnormal   Collection Time: 03/02/15  6:50 AM  Result Value Ref Range   Glucose-Capillary 190 (H) 65 - 99 mg/dL  Glucose, capillary     Status: None   Collection Time: 03/02/15 11:23 AM  Result Value Ref Range   Glucose-Capillary 73 65 - 99 mg/dL   Comment 1 Notify RN   Glucose, capillary     Status: Abnormal   Collection Time: 03/02/15  5:13 PM  Result Value Ref Range   Glucose-Capillary 218 (H) 65 - 99 mg/dL   Comment 1 Notify RN   Glucose, capillary     Status: None   Collection Time: 03/02/15  8:37 PM  Result Value Ref Range   Glucose-Capillary 72 65 - 99 mg/dL  Glucose, capillary     Status: Abnormal   Collection Time: 03/03/15  6:51 AM  Result Value Ref Range   Glucose-Capillary  120 (H) 65 - 99 mg/dL  Glucose, capillary     Status: Abnormal   Collection Time: 03/03/15 11:18 AM  Result Value Ref Range   Glucose-Capillary 188 (H) 65 - 99 mg/dL   Comment 1 Notify RN   Glucose, capillary     Status: Abnormal   Collection Time: 03/03/15  4:28 PM  Result Value Ref Range   Glucose-Capillary 116 (H) 65 - 99 mg/dL   Comment 1 Notify RN   Glucose, capillary     Status: Abnormal   Collection Time: 03/03/15  9:08 PM  Result Value Ref Range   Glucose-Capillary 201 (H) 65 - 99 mg/dL  Glucose, capillary     Status: Abnormal   Collection Time: 03/04/15  6:44 AM  Result Value Ref Range   Glucose-Capillary 148 (H) 65 - 99 mg/dL     HEENT: normal Cardio: RRR Resp: CTA B/L and unlabored GI: BS positive and nontender nondistended Extremity:  Pulses positive and No Edema Skin:   Intact Neuro: Alert/Oriented Musc/Skel:  Other Left BKA with Coban wrap---minimal tenderness with manipulation of limb Gen. no acute distress   Assessment/Plan: 1. Functional deficits secondary to  L-BKA which require 3+ hours per day of interdisciplinary therapy in a comprehensive inpatient rehab setting. Physiatrist is providing close  team supervision and 24 hour management of active medical problems listed below. Physiatrist and rehab team continue to assess barriers to discharge/monitor patient progress toward functional and medical goals. Plan discharge in the morning FIM: FIM - Bathing Bathing Steps Patient Completed: Chest, Right Arm, Left Arm, Abdomen, Front perineal area, Buttocks, Right upper leg, Left upper leg, Right lower leg (including foot) Bathing: 6: More than reasonable amount of time  FIM - Upper Body Dressing/Undressing Upper body dressing/undressing steps patient completed: Thread/unthread right sleeve of pullover shirt/dresss, Thread/unthread left sleeve of pullover shirt/dress, Put head through opening of pull over shirt/dress, Pull shirt over trunk Upper body  dressing/undressing: 6: More than reasonable amount of time FIM - Lower Body Dressing/Undressing Lower body dressing/undressing steps patient completed: Thread/unthread right underwear leg, Thread/unthread left underwear leg, Pull underwear up/down, Thread/unthread right pants leg, Thread/unthread left pants leg, Pull pants up/down, Fasten/unfasten pants, Don/Doff right sock, Don/Doff right shoe, Fasten/unfasten right shoe Lower body dressing/undressing: 6: More than reasonable amount of time  FIM - Toileting Toileting steps completed by patient: Adjust clothing prior to toileting, Performs perineal hygiene, Adjust clothing after toileting Toileting Assistive Devices: Grab bar or rail for support Toileting: 5: Supervision: Safety issues/verbal cues  FIM - Diplomatic Services operational officer Devices: Environmental consultant, Therapist, music Transfers: 5-To toilet/BSC: Supervision (verbal cues/safety issues), 5-From toilet/BSC: Supervision (verbal cues/safety issues)  FIM - Banker Devices: Environmental consultant, Arm rests Bed/Chair Transfer: 5: Bed > Chair or W/C: Supervision (verbal cues/safety issues), 5: Chair or W/C > Bed: Supervision (verbal cues/safety issues)  FIM - Locomotion: Wheelchair Distance: 150 Locomotion: Wheelchair: 6: Travels 150 ft or more, turns around, maneuvers to table, bed or toilet, negotiates 3% grade: maneuvers on rugs and over door sills independently FIM - Locomotion: Ambulation Locomotion: Ambulation Assistive Devices: Designer, industrial/product Ambulation/Gait Assistance: 5: Supervision Locomotion: Ambulation: 0: Activity did not occur  Comprehension Comprehension Mode: Auditory Comprehension: 6-Follows complex conversation/direction: With extra time/assistive device  Expression Expression Mode: Verbal Expression: 6-Expresses complex ideas: With extra time/assistive device  Social Interaction Social Interaction: 7-Interacts appropriately with  others - No medications needed.  Problem Solving Problem Solving: 7-Solves complex problems: Recognizes & self-corrects  Memory Memory: 5-Recognizes or recalls 90% of the time/requires cueing < 10% of the time  Medical Problem List and Plan: 1. Functional deficits secondary to L-BKA 2. DVT Prophylaxis/Anticoagulation: Pharmaceutical: Lovenox 3. Pain Management: Continue oxycodone prn. phantom pain, gabapentin 4. Mood: Continue Zoloft. LCSW to follow for evaluation and support.  5. Neuropsych: This patient is capable of making decisions on his own behalf. 6. Skin/Wound Care: Continue compressive dressing. Routine pressure relief measures.   -removed dressing---change dressing daily moving forward with ACE 7. Fluids/Electrolytes/Nutrition: Monitor I/O. Off supplements if intake poor.   8. DM type 2:   Continue glucotrol and lantus as per home regiment.   -reasonable control overall 9. Dehydration: Push po fluids.    10. Pancrease/Renal transplant: On Prograf bid and Cellcept bid. 11. HTN: Will monitor BP every 8 hours. Continue Norvasc and minipress daily LOS (Days) 10 A FACE TO FACE EVALUATION WAS PERFORMED  Noreene Boreman E 03/04/2015, 10:31 AM

## 2015-03-05 ENCOUNTER — Inpatient Hospital Stay (HOSPITAL_COMMUNITY): Payer: Medicare Other | Admitting: Rehabilitation

## 2015-03-05 LAB — GLUCOSE, CAPILLARY
Glucose-Capillary: 128 mg/dL — ABNORMAL HIGH (ref 65–99)
Glucose-Capillary: 115 mg/dL — ABNORMAL HIGH (ref 65–99)

## 2015-03-05 MED ORDER — ASPIRIN 325 MG PO TBEC: 325.0000 mg | DELAYED_RELEASE_TABLET | Freq: Every day | ORAL | Status: DC

## 2015-03-05 MED ORDER — OXYCODONE HCL 10 MG PO TABS: 5.0000 mg | ORAL_TABLET | Freq: Four times a day (QID) | ORAL | Status: DC | PRN

## 2015-03-05 MED ORDER — INSULIN DETEMIR 100 UNIT/ML FLEXPEN: 8.0000 [IU] | PEN_INJECTOR | Freq: Every day | SUBCUTANEOUS | Status: DC

## 2015-03-05 MED ORDER — GABAPENTIN 300 MG PO CAPS: 300.0000 mg | ORAL_CAPSULE | Freq: Two times a day (BID) | ORAL | Status: DC

## 2015-03-05 NOTE — Progress Notes (Signed)
Physical Therapy Session Note  Patient Details  Name: [name] DikeClarence [name] MRN: [id number] Date of Birth: 1951-06-04  Today's Date: 03/05/2015 PT Individual Time: 1630-1700 PT Individual Time Calculation (min): 30 min   Short Term Goals: Week 2:  PT Short Term Goal 1 (Week 2): STGs = LTGs due to ELOS  Skilled Therapeutic Interventions/Progress Updates:   Skilled session focused on family education with wife for negotiation of stairs into and out of home as well as safe car transfer with management of w/c parts and how to fold to place into car.  Pt self propelled to/from therapy gym at mod I level.  Set up large 8" step with smaller 3" step to simulate home entry.  PT performed with pt with wife returning demonstration at min A level.  Cues to remain with hands on pt until all the way into house, esp when pt is elevating walker to step for safety.  Performed car transfer to simulated van height.  Cues to pt to remove L amputee pad for increased closeness to car.  Both verbalized understanding.  Educated and demonstrated to wife on w/c management with removal of parts and how to fold for entry into car.  She returned demonstration.  Continued to provide education that pt is to be mod I at w/c level and S at an ambulatory level for safety.  Both verbalized understanding.  Pt propelled back to room and left in w/c, PA to provide D/C papers.   Therapy Documentation Precautions:  Precautions Precautions: Fall Restrictions Weight Bearing Restrictions: Yes LLE Weight Bearing: Non weight bearing Other Position/Activity Restrictions: L LE  Pain: Pt with no c/o pain during session.   See FIM for current functional status  Therapy/Group: Individual Therapy  Vista Deckarcell, Amee Boothe Ann 03/05/2015, 12:39 PM

## 2015-03-05 NOTE — Discharge Summary (Signed)
Physician Discharge Summary  Patient ID: [name] MRN: [id number] DOB/AGE: 62-Jul-1952 62 y.o.  Admit date: 02/22/2015 Discharge date: 03/05/2015  Discharge Diagnoses:  Principal Problem:   Status post below knee amputation of left lower extremity Active Problems:   Essential hypertension   Diabetes mellitus without complication   Discharged Condition: Stable   Labs:  Basic Metabolic Panel: BMP Latest Ref Rng 03/01/2015 02/23/2015 02/20/2015  Glucose 65 - 99 mg/dL - 97 409(W220(H)  BUN 6 - 20 mg/dL - 12 11(B22(H)  Creatinine 0.61 - 1.24 mg/dL 1.47(W1.36(H) 2.950.91 6.211.10  Sodium 135 - 145 mmol/L - 137 138  Potassium 3.5 - 5.1 mmol/L - 4.6 4.4  Chloride 101 - 111 mmol/L - 102 101  CO2 22 - 32 mmol/L - 28 27  Calcium 8.9 - 10.3 mg/dL - 9.3 9.7     CBC: CBC Latest Ref Rng 02/23/2015 02/20/2015 01/23/2015  WBC 4.0 - 10.5 K/uL 7.9 9.5 6.4  Hemoglobin 13.0 - 17.0 g/dL 3.0(Q9.4(L) 10.6(L) 11.9(L)  Hematocrit 39.0 - 52.0 % 30.4(L) 33.5(L) 36.5(L)  Platelets 150 - 400 K/uL 212 231 202     CBG:  Recent Labs Lab 03/04/15 1203 03/04/15 1645 03/04/15 2114 03/05/15 0622 03/05/15 1149  GLUCAP 152* 109* 180* 128* 115*     Today's Vitals   03/04/15 2154 03/04/15 2251 03/05/15 0545 03/05/15 1449  BP:   153/58 135/68  Pulse:   82 92  Temp:   98.8 F (37.1 C) 98.2 F (36.8 C)  TempSrc:   Oral Oral  Resp:   17 18  Height:      Weight:      SpO2:   100% 100%  PainSc: 2  Asleep     Brief HPI:   [name] is a 64 y.o. male with history of DM type 2 with retionopathy, s/p renal-pancreas transplant, PTSD, PVD with left foot infection and failed attempts at limb salvage. 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 Lajoyce Cornersuda. Post op limited by pain as well as weakness affecting mobility. PT evaluation done and CIR was recommended for follow up therapy.   Hospital Course: [name] was admitted to rehab 02/22/2015 for inpatient therapies to consist of PT and OT at least three hours five days a  week. Past admission physiatrist, therapy team and rehab RN have worked together to provide customized collaborative inpatient rehab.  Blood pressures have been monitored on tid basis and have been reasonably controlled on norvasc. Diabetes was monitored with ac/hs checks and lantus was increased to 8 units at bedtime. Po intake has improved and patient was advised to increase lantus to 10 units if blood sugars continue to be over 120.  Post op labs reveal ABLA.  He is voiding without difficulty and po intake has been good. He has had a rise in creatinine to 1.3 and is to follow up with Dr. Jeanice Limurham for recheck in the next two weeks. L-BKA is healing well without signs or symptoms of infection and staples remain intact.  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 He continues to use oxycodone for pain additionally on prn basis.  His mood and outlook have  improved and he has made steady progress during his rehab stay. He is currently modified independent at at wheelchair level at discharge. He will continue to receive follow up HHPT, HHOT, HHRN and HH Aide by [facility] past discharge.     Rehab course: During  patient's stay in rehab weekly team conferences were held to monitor patient's progress, set goals and discuss barriers to discharge. At admission, patient required min assist with mobility and self care tasks. He has had improvement in activity tolerance, balance, postural control, as well as ability to compensate for deficits. He is independent for wheelchair mobility. He is able to perform squat pivot/stand pivot transfers with supervision. He is able to ambulate 150' with supervision/RW.  He requires min assist to climb one step. Family education was done with multiple family members who will provide assistance past discharge.    Disposition: Home  Diet: Diabetic diet  Special Instructions: 1. Check blood sugars  2-3 times a day before meals and record. 2. Follow up with podiatry to get toe nails clipped.  3. Wear shoes on right foot when out of bed.  4. Wash incision with soap and water. Pat dry. Apply dry compressive dressing. Contact MD if you notice any redness, increase in swelling, purulent drainage or develop fever or chills.    Medication List    STOP taking these medications        benzonatate 100 MG capsule  Commonly known as:  TESSALON     ciprofloxacin 500 MG tablet  Commonly known as:  CIPRO     guaiFENesin-dextromethorphan 100-10 MG/5ML syrup  Commonly known as:  ROBITUSSIN DM     insulin glargine 100 UNIT/ML injection  Commonly known as:  LANTUS     methocarbamol 500 MG tablet  Commonly known as:  ROBAXIN     sertraline 100 MG tablet  Commonly known as:  ZOLOFT      TAKE these medications        allopurinol 300 MG tablet  Commonly known as:  ZYLOPRIM  Take 300 mg by mouth 2 (two) times daily.     amLODipine 10 MG tablet  Commonly known as:  NORVASC  Take 10 mg by mouth every morning.     aspirin 325 MG EC tablet  Take 1 tablet (325 mg total) by mouth daily.     docusate sodium 100 MG capsule  Commonly known as:  COLACE  Take 100 mg by mouth 2 (two) times daily as needed for mild constipation.     freestyle lancets  Use as instructed     gabapentin 300 MG capsule  Commonly known as:  NEURONTIN  Take 1 capsule (300 mg total) by mouth 2 (two) times daily.     glipiZIDE 10 MG tablet  Commonly known as:  GLUCOTROL  Take 10 mg by mouth daily before breakfast.     Insulin Detemir 100 UNIT/ML Pen  Commonly known as:  LEVEMIR  Inject 8 Units into the skin daily at 10 pm.     mirtazapine 15 MG tablet  Commonly known as:  REMERON  Take 7.5 mg by mouth at bedtime as needed (insomnia).     mycophenolate 250 MG capsule  Commonly known as:  CELLCEPT  Take 1,000 mg by mouth 2 (two) times daily.     Oxycodone HCl 10 MG Tabs--Rx #75 pills  Take 0.5-1 tablets  (5-10 mg total) by mouth every 6 (six) hours as needed for moderate pain.     pantoprazole 40 MG tablet  Commonly known as:  PROTONIX  Take 1 tablet (40 mg total) by mouth daily.     prazosin 2 MG capsule  Commonly known as:  MINIPRESS  Take 6 mg by mouth at bedtime.     simvastatin 40 MG  tablet  Commonly known as:  ZOCOR  Take 20 mg by mouth at bedtime.     tacrolimus 1 MG capsule  Commonly known as:  PROGRAF  Take 5 mg by mouth 2 (two) times daily.           Follow-up Information    Follow up with Dr. Gertha Calkin On 03/20/2015.   Why:  @ 1:30 pm for follow up appointment   Contact information:   [facility] [phone] ext. [phone] Fax # [phone]      Follow up with Ranelle Oyster, MD On 05/22/2015.   Specialty:  Physical Medicine and Rehabilitation   Why:  Be there at 11:30 for noon   Contact information:   [address] [place] [address] [phone]       Follow up with Nadara Mustard, MD. Call today.   Specialty:  Orthopedic Surgery   Why:  for post op check in 2 weeks.    Contact information:   [address] [place] [address] [phone]       Signed: Jacquelynn Cree 03/05/2015, 5:34 PM

## 2015-03-05 NOTE — Progress Notes (Signed)
Social Work Discharge Note  The overall goal for the admission was met for:   Discharge location: Yes - home  Length of Stay: Yes - 11 days  Discharge activity level: Yes - modified independent with supervision for some tasks  Home/community participation: Yes  Services provided included: MD, RD, PT, OT, RN, Pharmacy and SW  Financial Services: Medicare and service connected to the [facility]  Follow-up services arranged: Home Health: PT/OT/RN/Aide, DME: [id number]" lightweight w/c with left amputee support pad and basic cushion; tub transfer bench and Patient/Family request agency HH: Pt had [facility] prior to admission.  CSW resumed this., DME: Pt wanted to get the tub transfer bench from the [place] and CSW ordered this to be shipped to his home.  Wheelchair ordered through [facility] [facility] so pt's PT could assess the fit.  Comments (or additional information):  Pt to return to his home with his wife and stepson.  Pt's brother and sister came for family education and will help him intermittently.  Pt is able to direct his care.  Pt preferred to communicate with his wife when she was not working and did not want staff to interrupt her during her work day.  Patient/Family verbalized understanding of follow-up arrangements: Yes  Individual responsible for coordination of the follow-up plan: pt with support from his family as needed  Confirmed correct DME delivered: Trey Sailors 03/05/2015    Saprina Chuong, Silvestre Mesi

## 2015-03-05 NOTE — Patient Instructions (Addendum)
Knee Extension / Gluteal Set (Prone)   With towel roll between thighs, tighten thigh muscles to straighten knees. Squeeze towel roll and tighten buttock muscles. Hold ____ seconds. Repeat ____ times. Do ____ sessions per day.  Copyright  VHI. All rights reserved.  Push-Up (Kneeling)   On knees and hands, with back straight, lower body toward surface and push up. Repeat ____ times. Do ____ sessions per day.  Copyright  VHI. All rights reserved.  Wall Slide (Quarter Squat)   Tighten stomach muscles to flatten back against wall. Slowly lower buttocks until thigh reaches a 45 angle to the floor. Hold ____ seconds. Repeat ____ times. Do ____ sessions per day. CAUTION: Place a stool under buttocks and use assistive device for added stability as needed.  Copyright  VHI. All rights reserved.  Piriformis Stretch (Supine)   Grasp sound limb ankle and knee. Pull equally toward opposite shoulder. Hold ____ seconds. Repeat ____ times. Do ____ sessions per day.  Copyright  VHI. All rights reserved.  Single Limb Balance   Stand with wheelchair directly behind you and locked. Practice lifting hands from walker for a few seconds at a time to balance. Increase balance time as tolerated. Repeat ____ times. Do ____ sessions per day.  Copyright  VHI. All rights reserved.

## 2015-03-05 NOTE — Plan of Care (Signed)
Problem: RH Other (Specify) Goal: RH LTG Other (Specify) Outcome: Not Applicable Date Met:  03/05/15 Wife comfortable with assisting pt with stairs using RW and therefore did not teach bump up in w/c.       

## 2015-03-05 NOTE — Discharge Instructions (Signed)
Inpatient Rehab Discharge Instructions  [name] Discharge date and time: 03/05/15   Activities/Precautions/ Functional Status: Activity: activity as tolerated. No driving while taking narcotics Diet: diabetic diet Wound Care: Wash incision with soap and water. Pat dry. Apply dry compressive dressing. Contact MD if you notice any redness, increase in swelling, purulent drainage or develop fever or chills.   Functional status:  ___ No restrictions     ___ Walk up steps independently _X__ 24/7 supervision/assistance   ___ Walk up steps with assistance ___ Intermittent supervision/assistance  ___ Bathe/dress independently ___ Walk with walker     _X__ Bathe/dress with assistance ___ Walk Independently    ___ Shower independently ___ Walk with assistance    ___ Shower with assistance _X__ No alcohol     ___ Return to work/school ________  COMMUNITY REFERRALS UPON DISCHARGE:   Home Health:   PT     OT     RN    SNA  Agency:  [facility] Phone:  [phone] Medical Equipment/Items Ordered:  Tub [place] bench [place] [facility] [place] your [place].  Call Dannielle HuhDanny if any problems  [phone]  Ext. [phone]  16"x18" lightweight wheelchair with left amputee support pad and basic cushion from [facility], delivered to your hospital room  [phone] Other:  Signed Handicap Placard application  GENERAL COMMUNITY RESOURCES FOR PATIENT/FAMILY: Support Groups:  [facility]                               Erlinda HongBob Romano - [phone]                               Vladimir Fasterobin Waldron, PT - [phone]  Special Instructions: 1. Check blood sugars 2-3 times a day before meals and record. 2. Follow up with podiatry to get toe nails clipped.  3. Wear shoes on right foot when out of bed.   My questions have been answered and I understand these instructions. I will adhere to these goals and the provided educational materials after my discharge from the  hospital.  Patient/Caregiver Signature _______________________________ Date __________  Clinician Signature _______________________________________ Date __________  Please bring this form and your medication list with you to all your follow-up doctor's appointments.

## 2015-03-05 NOTE — Progress Notes (Signed)
Subjective/Complaints: Looking forward to discharge today.  Objective: Vital Signs: Blood pressure 153/58, pulse 82, temperature 98.8 F (37.1 C), temperature source Oral, resp. rate 17, height 5\' 10"  (1.778 m), weight 76.204 kg (168 lb), SpO2 100 %. No results found. Results for orders placed or performed during the hospital encounter of 02/22/15 (from the past 72 hour(s))  Glucose, capillary     Status: None   Collection Time: 03/02/15 11:23 AM  Result Value Ref Range   Glucose-Capillary 73 65 - 99 mg/dL   Comment 1 Notify RN   Glucose, capillary     Status: Abnormal   Collection Time: 03/02/15  5:13 PM  Result Value Ref Range   Glucose-Capillary 218 (H) 65 - 99 mg/dL   Comment 1 Notify RN   Glucose, capillary     Status: None   Collection Time: 03/02/15  8:37 PM  Result Value Ref Range   Glucose-Capillary 72 65 - 99 mg/dL  Glucose, capillary     Status: Abnormal   Collection Time: 03/03/15  6:51 AM  Result Value Ref Range   Glucose-Capillary 120 (H) 65 - 99 mg/dL  Glucose, capillary     Status: Abnormal   Collection Time: 03/03/15 11:18 AM  Result Value Ref Range   Glucose-Capillary 188 (H) 65 - 99 mg/dL   Comment 1 Notify RN   Glucose, capillary     Status: Abnormal   Collection Time: 03/03/15  4:28 PM  Result Value Ref Range   Glucose-Capillary 116 (H) 65 - 99 mg/dL   Comment 1 Notify RN   Glucose, capillary     Status: Abnormal   Collection Time: 03/03/15  9:08 PM  Result Value Ref Range   Glucose-Capillary 201 (H) 65 - 99 mg/dL  Glucose, capillary     Status: Abnormal   Collection Time: 03/04/15  6:44 AM  Result Value Ref Range   Glucose-Capillary 148 (H) 65 - 99 mg/dL  Glucose, capillary     Status: Abnormal   Collection Time: 03/04/15 12:03 PM  Result Value Ref Range   Glucose-Capillary 152 (H) 65 - 99 mg/dL  Glucose, capillary     Status: Abnormal   Collection Time: 03/04/15  4:45 PM  Result Value Ref Range   Glucose-Capillary 109 (H) 65 - 99 mg/dL   Glucose, capillary     Status: Abnormal   Collection Time: 03/04/15  9:14 PM  Result Value Ref Range   Glucose-Capillary 180 (H) 65 - 99 mg/dL  Glucose, capillary     Status: Abnormal   Collection Time: 03/05/15  6:22 AM  Result Value Ref Range   Glucose-Capillary 128 (H) 65 - 99 mg/dL     HEENT: normal Cardio: RRR Resp: CTA B/L and unlabored GI: BS positive and nontender nondistended Extremity:  Pulses positive and No Edema Skin:   Intact Neuro: Alert/Oriented Musc/Skel:  Other Left BKA ace wrap Gen. no acute distress   Assessment/Plan: 1. Functional deficits secondary to  L-BKA  Stable for D/C today F/u PCP in 1-2 weeks F/u PM&R 3 weeks See D/C summary See D/C instructions FIM: FIM - Bathing Bathing Steps Patient Completed: Chest, Right Arm, Left Arm, Abdomen, Front perineal area, Buttocks, Right upper leg, Left upper leg, Right lower leg (including foot) Bathing: 6: More than reasonable amount of time  FIM - Upper Body Dressing/Undressing Upper body dressing/undressing steps patient completed: Thread/unthread right sleeve of pullover shirt/dresss, Thread/unthread left sleeve of pullover shirt/dress, Put head through opening of pull over shirt/dress, Pull  shirt over trunk Upper body dressing/undressing: 6: More than reasonable amount of time FIM - Lower Body Dressing/Undressing Lower body dressing/undressing steps patient completed: Thread/unthread right underwear leg, Thread/unthread left underwear leg, Pull underwear up/down, Thread/unthread right pants leg, Thread/unthread left pants leg, Pull pants up/down, Fasten/unfasten pants, Don/Doff right sock, Don/Doff right shoe, Fasten/unfasten right shoe Lower body dressing/undressing: 6: More than reasonable amount of time  FIM - Toileting Toileting steps completed by patient: Adjust clothing prior to toileting, Performs perineal hygiene, Adjust clothing after toileting Toileting Assistive Devices: Grab bar or rail for  support Toileting: 6: Assistive device: No helper  FIM - Diplomatic Services operational officer Devices: Environmental consultant, Therapist, music Transfers: 5-To toilet/BSC: Supervision (verbal cues/safety issues), 5-From toilet/BSC: Supervision (verbal cues/safety issues)  FIM - Banker Devices: Arm rests Bed/Chair Transfer: 6: Supine > Sit: No assist, 6: Sit > Supine: No assist, 5: Bed > Chair or W/C: Supervision (verbal cues/safety issues), 5: Chair or W/C > Bed: Supervision (verbal cues/safety issues)  FIM - Locomotion: Wheelchair Distance: 150 Locomotion: Wheelchair: 6: Travels 150 ft or more, turns around, maneuvers to table, bed or toilet, negotiates 3% grade: maneuvers on rugs and over door sills independently FIM - Locomotion: Ambulation Locomotion: Ambulation Assistive Devices: Designer, industrial/product Ambulation/Gait Assistance: 5: Supervision Locomotion: Ambulation: 5: Travels 150 ft or more with supervision/safety issues  Comprehension Comprehension Mode: Auditory Comprehension: 6-Follows complex conversation/direction: With extra time/assistive device  Expression Expression Mode: Verbal Expression: 6-Expresses complex ideas: With extra time/assistive device  Social Interaction Social Interaction: 7-Interacts appropriately with others - No medications needed.  Problem Solving Problem Solving: 7-Solves complex problems: Recognizes & self-corrects  Memory Memory: 5-Recognizes or recalls 90% of the time/requires cueing < 10% of the time  Medical Problem List and Plan: 1. Functional deficits secondary to L-BKA 2. DVT Prophylaxis/Anticoagulation: Pharmaceutical: Lovenox 3. Pain Management: Continue oxycodone prn. phantom pain, gabapentin 4. Mood: Continue Zoloft. LCSW to follow for evaluation and support.  5. Neuropsych: This patient is capable of making decisions on his own behalf. 6. Skin/Wound Care: Continue compressive dressing. Routine  pressure relief measures.   -removed dressing---change dressing daily moving forward with ACE, follow-up orthopedics 7. Fluids/Electrolytes/Nutrition: Monitor I/O. Off supplements if intake poor.   8. DM type 2:   Continue glucotrol and lantus as per home regiment.   -reasonable control overall 9. Dehydration: Push po fluids.    10. Pancrease/Renal transplant: On Prograf bid and Cellcept bid.follow-up renal 11. HTN: Will monitor BP every 8 hours. Continue Norvasc and minipress daily, follow-up PCP LOS (Days) 11 A FACE TO FACE EVALUATION WAS PERFORMED  Argenis Kumari E 03/05/2015, 10:08 AM

## 2015-03-05 NOTE — Progress Notes (Signed)
Recreational Therapy Discharge Summary Patient Details  Name: [name] MRN: [id number] Date of Birth: [birth year] Today's Date: 03/05/2015  Long term goals set: 1  Long term goals met: 1  Comments on progress toward goals: Pt has made good progress during LOS and is ready for discharge home today at mod I w/c level.  Pt is currently supervision for standing TR tasks for safety.  Education provided on community reintegration, energy conservation, leisure adaptations specifically as it relates to meal prep.  Pt declined formal community reintegration during LOS to purchase ingredients for his cooking task as well as simple meal prep but is anxious to return home and perform meal prep for his family.  Reasons for discharge: discharge from hospital Patient/family agrees with progress made and goals achieved: Yes  [name] 03/05/2015, 8:34 AM

## 2015-03-05 NOTE — Progress Notes (Signed)
Discharged to home accompanied by wife. Discharge info given by Marissa NestlePam Love PA, no questions noted. Pamelia HoitSharp, Marilin Kofman B

## 2015-03-06 DIAGNOSIS — F4310 Post-traumatic stress disorder, unspecified: Secondary | ICD-10-CM | POA: Diagnosis not present

## 2015-03-06 DIAGNOSIS — I739 Peripheral vascular disease, unspecified: Secondary | ICD-10-CM | POA: Diagnosis not present

## 2015-03-06 DIAGNOSIS — Z89512 Acquired absence of left leg below knee: Secondary | ICD-10-CM | POA: Diagnosis not present

## 2015-03-06 DIAGNOSIS — G4733 Obstructive sleep apnea (adult) (pediatric): Secondary | ICD-10-CM | POA: Diagnosis not present

## 2015-03-06 DIAGNOSIS — I10 Essential (primary) hypertension: Secondary | ICD-10-CM | POA: Diagnosis not present

## 2015-03-06 DIAGNOSIS — E11319 Type 2 diabetes mellitus with unspecified diabetic retinopathy without macular edema: Secondary | ICD-10-CM | POA: Diagnosis not present

## 2015-03-06 DIAGNOSIS — Z4781 Encounter for orthopedic aftercare following surgical amputation: Secondary | ICD-10-CM | POA: Diagnosis not present

## 2015-03-06 DIAGNOSIS — Z794 Long term (current) use of insulin: Secondary | ICD-10-CM | POA: Diagnosis not present

## 2015-03-08 DIAGNOSIS — I739 Peripheral vascular disease, unspecified: Secondary | ICD-10-CM | POA: Diagnosis not present

## 2015-03-08 DIAGNOSIS — Z4781 Encounter for orthopedic aftercare following surgical amputation: Secondary | ICD-10-CM | POA: Diagnosis not present

## 2015-03-08 DIAGNOSIS — I10 Essential (primary) hypertension: Secondary | ICD-10-CM | POA: Diagnosis not present

## 2015-03-08 DIAGNOSIS — E11319 Type 2 diabetes mellitus with unspecified diabetic retinopathy without macular edema: Secondary | ICD-10-CM | POA: Diagnosis not present

## 2015-03-08 DIAGNOSIS — Z89512 Acquired absence of left leg below knee: Secondary | ICD-10-CM | POA: Diagnosis not present

## 2015-03-08 DIAGNOSIS — G4733 Obstructive sleep apnea (adult) (pediatric): Secondary | ICD-10-CM | POA: Diagnosis not present

## 2015-03-11 DIAGNOSIS — I739 Peripheral vascular disease, unspecified: Secondary | ICD-10-CM | POA: Diagnosis not present

## 2015-03-11 DIAGNOSIS — G4733 Obstructive sleep apnea (adult) (pediatric): Secondary | ICD-10-CM | POA: Diagnosis not present

## 2015-03-11 DIAGNOSIS — Z89512 Acquired absence of left leg below knee: Secondary | ICD-10-CM | POA: Diagnosis not present

## 2015-03-11 DIAGNOSIS — I10 Essential (primary) hypertension: Secondary | ICD-10-CM | POA: Diagnosis not present

## 2015-03-11 DIAGNOSIS — Z4781 Encounter for orthopedic aftercare following surgical amputation: Secondary | ICD-10-CM | POA: Diagnosis not present

## 2015-03-11 DIAGNOSIS — E11319 Type 2 diabetes mellitus with unspecified diabetic retinopathy without macular edema: Secondary | ICD-10-CM | POA: Diagnosis not present

## 2015-03-14 ENCOUNTER — Emergency Department (HOSPITAL_COMMUNITY)
Admission: EM | Admit: 2015-03-14 | Discharge: 2015-03-14 | Disposition: A | Discharge status: Home / Self Care | Payer: Medicare Other | Attending: Emergency Medicine | Admitting: Emergency Medicine

## 2015-03-14 ENCOUNTER — Encounter (HOSPITAL_COMMUNITY): Payer: Self-pay | Admitting: Emergency Medicine

## 2015-03-14 ENCOUNTER — Emergency Department (HOSPITAL_COMMUNITY): Payer: Medicare Other

## 2015-03-14 DIAGNOSIS — G4730 Sleep apnea, unspecified: Secondary | ICD-10-CM | POA: Diagnosis not present

## 2015-03-14 DIAGNOSIS — Z89512 Acquired absence of left leg below knee: Secondary | ICD-10-CM | POA: Diagnosis not present

## 2015-03-14 DIAGNOSIS — Z4781 Encounter for orthopedic aftercare following surgical amputation: Secondary | ICD-10-CM | POA: Diagnosis not present

## 2015-03-14 DIAGNOSIS — I739 Peripheral vascular disease, unspecified: Secondary | ICD-10-CM | POA: Diagnosis not present

## 2015-03-14 DIAGNOSIS — K219 Gastro-esophageal reflux disease without esophagitis: Secondary | ICD-10-CM | POA: Insufficient documentation

## 2015-03-14 DIAGNOSIS — H548 Legal blindness, as defined in USA: Secondary | ICD-10-CM | POA: Insufficient documentation

## 2015-03-14 DIAGNOSIS — Z9981 Dependence on supplemental oxygen: Secondary | ICD-10-CM | POA: Diagnosis not present

## 2015-03-14 DIAGNOSIS — Z8659 Personal history of other mental and behavioral disorders: Secondary | ICD-10-CM | POA: Diagnosis not present

## 2015-03-14 DIAGNOSIS — Z79899 Other long term (current) drug therapy: Secondary | ICD-10-CM | POA: Insufficient documentation

## 2015-03-14 DIAGNOSIS — E11319 Type 2 diabetes mellitus with unspecified diabetic retinopathy without macular edema: Secondary | ICD-10-CM | POA: Diagnosis not present

## 2015-03-14 DIAGNOSIS — Z794 Long term (current) use of insulin: Secondary | ICD-10-CM | POA: Insufficient documentation

## 2015-03-14 DIAGNOSIS — I10 Essential (primary) hypertension: Secondary | ICD-10-CM | POA: Diagnosis not present

## 2015-03-14 DIAGNOSIS — Z87891 Personal history of nicotine dependence: Secondary | ICD-10-CM | POA: Diagnosis not present

## 2015-03-14 DIAGNOSIS — E119 Type 2 diabetes mellitus without complications: Secondary | ICD-10-CM | POA: Insufficient documentation

## 2015-03-14 DIAGNOSIS — G4733 Obstructive sleep apnea (adult) (pediatric): Secondary | ICD-10-CM | POA: Diagnosis not present

## 2015-03-14 DIAGNOSIS — R079 Chest pain, unspecified: Secondary | ICD-10-CM | POA: Diagnosis present

## 2015-03-14 DIAGNOSIS — B029 Zoster without complications: Secondary | ICD-10-CM | POA: Diagnosis not present

## 2015-03-14 DIAGNOSIS — Z87448 Personal history of other diseases of urinary system: Secondary | ICD-10-CM | POA: Diagnosis not present

## 2015-03-14 LAB — BASIC METABOLIC PANEL
Anion gap: 9 (ref 5–15)
BUN: 22 mg/dL — ABNORMAL HIGH (ref 6–20)
CO2: 22 mmol/L (ref 22–32)
Calcium: 9.7 mg/dL (ref 8.9–10.3)
Chloride: 104 mmol/L (ref 101–111)
Creatinine, Ser: 1.05 mg/dL (ref 0.61–1.24)
GFR calc Af Amer: >60 mL/min (ref >60)
GFR calc non Af Amer: >60 mL/min (ref >60)
Glucose, Bld: 159 mg/dL — ABNORMAL HIGH (ref 65–99)
Potassium: 4.2 mmol/L (ref 3.5–5.1)
Sodium: 135 mmol/L (ref 135–145)

## 2015-03-14 LAB — CBC
HCT: 33.6 % — ABNORMAL LOW (ref 39.0–52.0)
Hemoglobin: 10.7 g/dL — ABNORMAL LOW (ref 13.0–17.0)
MCH: 25.4 pg — ABNORMAL LOW (ref 26.0–34.0)
MCHC: 31.8 g/dL (ref 30.0–36.0)
MCV: 79.6 fL (ref 78.0–100.0)
Platelets: 202 10*3/uL (ref 150–400)
RBC: 4.22 MIL/uL (ref 4.22–5.81)
RDW: 15.2 % (ref 11.5–15.5)
WBC: 6.9 10*3/uL (ref 4.0–10.5)

## 2015-03-14 LAB — I-STAT TROPONIN, ED: Troponin i, poc: 0 ng/mL (ref 0.00–0.08)

## 2015-03-14 LAB — BRAIN NATRIURETIC PEPTIDE: B Natriuretic Peptide: 24 pg/mL (ref 0.0–100.0)

## 2015-03-14 MED ORDER — ACYCLOVIR 800 MG PO TABS: 800.0000 mg | ORAL_TABLET | Freq: Every day | ORAL | Status: DC

## 2015-03-14 MED ORDER — OXYCODONE-ACETAMINOPHEN 5-325 MG PO TABS: 1.0000 | ORAL_TABLET | ORAL | Status: DC | PRN

## 2015-03-14 NOTE — ED Notes (Signed)
Rewrapped L leg stump with ace wrap.

## 2015-03-14 NOTE — ED Notes (Addendum)
The patient said he started having chest pain since tuesday.  He has had diarrhea since last night.  He rates his pain 8/10.  The patient is a recent BKA in May.  He is also a kidney transplant recipient.

## 2015-03-14 NOTE — ED Provider Notes (Signed)
CSN: [id number]     Arrival date & time 03/14/15  1800 History   First MD Initiated Contact with Patient 03/14/15 1807     Chief Complaint  Patient presents with  . Chest Pain    The patient said he started having chest pain since tuesday.  He has had diarrhea since last night.      (Consider location/radiation/quality/duration/timing/severity/associated sxs/prior Treatment) Patient is a 64 y.o. male presenting with chest pain.  Chest Pain Pain location:  R chest and R lateral chest Pain quality: burning and sharp   Pain radiates to:  Does not radiate Pain radiates to the back: no   Pain severity:  Severe Onset quality:  Gradual Duration:  2 days Timing:  Constant Progression:  Worsening Chronicity:  New Context comment:  Vesicular rash of R lateral chest and back Relieved by:  Nothing Worsened by:  Nothing tried Ineffective treatments:  None tried Associated symptoms: no cough, no fever, no nausea, no shortness of breath and not vomiting     Past Medical History  Diagnosis Date  . Renal disorder   . Diabetes mellitus without complication   . CPAP (continuous positive airway pressure) dependence   . Sleep apnea   . Legally blind in left eye, as defined in [place]   . PTSD (post-traumatic stress disorder)   . PVD (peripheral vascular disease) 01/17/2015  . Hypertension   . Anxiety   . GERD (gastroesophageal reflux disease)    Past Surgical History  Procedure Laterality Date  . Combined kidney-pancreas transplant Left 2009  . Incision and drainage abscess Left 01/13/2015    Procedure: INCISION AND DRAINAGE OF LEFT FOOT FIRST RAY ABCESS;  Surgeon: Kathryne Hitch, MD;  Location: [facility] OR;  Service: Orthopedics;  Laterality: Left;  . Abdominal aortagram N/A 01/21/2015    Procedure: ABDOMINAL Ronny Flurry;  Surgeon: Chuck Hint, MD;  Location[facility] CATH LAB;  Service: Cardiovascular;  Laterality: N/A;  . Lower extremity angiogram Left 01/21/2015    Procedure: LOWER  EXTREMITY ANGIOGRAM;  Surgeon: Chuck Hint, MD;  Location: [facility] CATH LAB;  Service: Cardiovascular;  Laterality: Left;  . Amputation Left 01/23/2015    Procedure: FIRST RAY AMPUTATION/LEFT FOOT;  Surgeon: Nadara Mustard, MD;  Location: [facility] OR;  Service: Orthopedics;  Laterality: Left;  . Av fistula placement    . Colonoscopy    . Amputation Left 02/20/2015    Procedure: AMPUTATION BELOW KNEE;  Surgeon: Nadara Mustard, MD;  Location: [facility] OR;  Service: Orthopedics;  Laterality: Left;   History reviewed. No pertinent family history. History  Substance Use Topics  . Smoking status: Former Games developer  . Smokeless tobacco: Never Used     Comment: quit i 1994ish  . Alcohol Use: Yes     Comment: 1 beer rarely     Review of Systems  Constitutional: Negative for fever.  Respiratory: Negative for cough and shortness of breath.   Cardiovascular: Positive for chest pain.  Gastrointestinal: Negative for nausea and vomiting.  All other systems reviewed and are negative.     Allergies  Review of patient's allergies indicates no known allergies.  Home Medications   Prior to Admission medications   Medication Sig Start Date End Date Taking? Authorizing Provider  acyclovir (ZOVIRAX) 800 MG tablet Take 1 tablet (800 mg total) by mouth 5 (five) times daily. 03/14/15   Mirian Mo, MD  allopurinol (ZYLOPRIM) 300 MG tablet Take 300 mg by mouth 2 (two) times daily.     Historical  Provider, MD  amLODipine (NORVASC) 10 MG tablet Take 10 mg by mouth every morning.    Historical Provider, MD  aspirin EC 325 MG EC tablet Take 1 tablet (325 mg total) by mouth daily. 03/05/15   Jacquelynn Cree, PA-C  docusate sodium (COLACE) 100 MG capsule Take 100 mg by mouth 2 (two) times daily as needed for mild constipation.     Historical Provider, MD  gabapentin (NEURONTIN) 300 MG capsule Take 1 capsule (300 mg total) by mouth 2 (two) times daily. 03/05/15   Evlyn Kanner Love, PA-C  glipiZIDE (GLUCOTROL) 10 MG tablet Take 10  mg by mouth daily before breakfast.    Historical Provider, MD  Insulin Detemir (LEVEMIR) 100 UNIT/ML Pen Inject 8 Units into the skin daily at 10 pm. 03/05/15   Evlyn Kanner Love, PA-C  Lancets (FREESTYLE) lancets Use as instructed 01/25/15   Richarda Overlie, MD  mirtazapine (REMERON) 15 MG tablet Take 7.5 mg by mouth at bedtime as needed (insomnia).    Historical Provider, MD  mycophenolate (CELLCEPT) 250 MG capsule Take 1,000 mg by mouth 2 (two) times daily.    Historical Provider, MD  oxyCODONE 10 MG TABS Take 0.5-1 tablets (5-10 mg total) by mouth every 6 (six) hours as needed for moderate pain. 03/05/15   Jacquelynn Cree, PA-C  oxyCODONE-acetaminophen (PERCOCET/ROXICET) 5-325 MG per tablet Take 1-2 tablets by mouth every 4 (four) hours as needed for severe pain. 03/14/15   Mirian Mo, MD  pantoprazole (PROTONIX) 40 MG tablet Take 1 tablet (40 mg total) by mouth daily. 01/25/15   Richarda Overlie, MD  prazosin (MINIPRESS) 2 MG capsule Take 6 mg by mouth at bedtime.    Historical Provider, MD  simvastatin (ZOCOR) 40 MG tablet Take 20 mg by mouth at bedtime.    Historical Provider, MD  tacrolimus (PROGRAF) 1 MG capsule Take 5 mg by mouth 2 (two) times daily.     Historical Provider, MD   BP 171/72 mmHg  Pulse 95  Temp(Src) 98.7 F (37.1 C) (Oral)  Resp 14  SpO2 100% Physical Exam  Constitutional: He is oriented to person, place, and time. He appears well-developed and well-nourished.  HENT:  Head: Normocephalic and atraumatic.  Eyes: Conjunctivae and EOM are normal.  Neck: Normal range of motion. Neck supple.  Cardiovascular: Normal rate, regular rhythm and normal heart sounds.   Pulmonary/Chest: Effort normal and breath sounds normal. No respiratory distress.  Abdominal: He exhibits no distension. There is no tenderness. There is no rebound and no guarding.  Musculoskeletal: Normal range of motion.  Neurological: He is alert and oriented to person, place, and time.  Skin: Skin is warm and dry.   Erythematous vesicular rash to band from back to R lateral chest with percutaneous hyperesthesia  Vitals reviewed.   ED Course  Procedures (including critical care time) Labs Review Labs Reviewed  CBC - Abnormal; Notable for the following:    Hemoglobin 10.7 (*)    HCT 33.6 (*)    MCH 25.4 (*)    All other components within normal limits  BASIC METABOLIC PANEL - Abnormal; Notable for the following:    Glucose, Bld 159 (*)    BUN 22 (*)    All other components within normal limits  BRAIN NATRIURETIC PEPTIDE  I-STAT TROPOININ, ED    Imaging Review No results found.   EKG Interpretation   Date/Time:  Thursday March 14 2015 18:03:08 EDT Ventricular Rate:  98 PR Interval:  206 QRS Duration: 84  QT Interval:  312 QTC Calculation: 398 R Axis:   74 Text Interpretation:  Normal sinus rhythm Right atrial enlargement Minimal  voltage criteria for LVH, may be normal variant Borderline ECG No  significant change since last tracing Confirmed by Mirian Mo [phone])  on 03/14/2015 8:27:26 PM      MDM   Final diagnoses:  Shingles    64 y.o. male with pertinent PMH of prior renal tx on prograf and cellcept presents with zoster.  No ocular or facial symptoms suggesting active complication.  No systemic symptoms.  DC home with acyclovir and standard return precautions.    I have reviewed all laboratory and imaging studies if ordered as above  1. Shingles         Mirian Mo, MD 03/15/15 [phone]

## 2015-03-14 NOTE — Discharge Instructions (Signed)

## 2015-03-18 DIAGNOSIS — I10 Essential (primary) hypertension: Secondary | ICD-10-CM

## 2015-03-18 DIAGNOSIS — Z89512 Acquired absence of left leg below knee: Secondary | ICD-10-CM | POA: Diagnosis not present

## 2015-03-18 DIAGNOSIS — Z794 Long term (current) use of insulin: Secondary | ICD-10-CM

## 2015-03-18 DIAGNOSIS — F4310 Post-traumatic stress disorder, unspecified: Secondary | ICD-10-CM

## 2015-03-18 DIAGNOSIS — G4733 Obstructive sleep apnea (adult) (pediatric): Secondary | ICD-10-CM

## 2015-03-18 DIAGNOSIS — E11319 Type 2 diabetes mellitus with unspecified diabetic retinopathy without macular edema: Secondary | ICD-10-CM | POA: Diagnosis not present

## 2015-03-18 DIAGNOSIS — Z4781 Encounter for orthopedic aftercare following surgical amputation: Secondary | ICD-10-CM | POA: Diagnosis not present

## 2015-03-18 DIAGNOSIS — I739 Peripheral vascular disease, unspecified: Secondary | ICD-10-CM | POA: Diagnosis not present

## 2015-03-19 ENCOUNTER — Encounter (HOSPITAL_COMMUNITY): Payer: Self-pay | Admitting: Emergency Medicine

## 2015-03-19 ENCOUNTER — Emergency Department (HOSPITAL_COMMUNITY)
Admission: EM | Admit: 2015-03-19 | Discharge: 2015-03-19 | Disposition: A | Discharge status: Home / Self Care | Payer: Medicare Other | Attending: Emergency Medicine | Admitting: Emergency Medicine

## 2015-03-19 DIAGNOSIS — Z794 Long term (current) use of insulin: Secondary | ICD-10-CM | POA: Diagnosis not present

## 2015-03-19 DIAGNOSIS — K219 Gastro-esophageal reflux disease without esophagitis: Secondary | ICD-10-CM | POA: Diagnosis not present

## 2015-03-19 DIAGNOSIS — Y998 Other external cause status: Secondary | ICD-10-CM | POA: Diagnosis not present

## 2015-03-19 DIAGNOSIS — Z87448 Personal history of other diseases of urinary system: Secondary | ICD-10-CM | POA: Insufficient documentation

## 2015-03-19 DIAGNOSIS — X58XXXA Exposure to other specified factors, initial encounter: Secondary | ICD-10-CM | POA: Insufficient documentation

## 2015-03-19 DIAGNOSIS — S91201A Unspecified open wound of right great toe with damage to nail, initial encounter: Secondary | ICD-10-CM | POA: Insufficient documentation

## 2015-03-19 DIAGNOSIS — Z79899 Other long term (current) drug therapy: Secondary | ICD-10-CM | POA: Insufficient documentation

## 2015-03-19 DIAGNOSIS — S91209A Unspecified open wound of unspecified toe(s) with damage to nail, initial encounter: Secondary | ICD-10-CM

## 2015-03-19 DIAGNOSIS — Z87891 Personal history of nicotine dependence: Secondary | ICD-10-CM | POA: Diagnosis not present

## 2015-03-19 DIAGNOSIS — E119 Type 2 diabetes mellitus without complications: Secondary | ICD-10-CM | POA: Diagnosis not present

## 2015-03-19 DIAGNOSIS — H548 Legal blindness, as defined in USA: Secondary | ICD-10-CM | POA: Diagnosis not present

## 2015-03-19 DIAGNOSIS — I739 Peripheral vascular disease, unspecified: Secondary | ICD-10-CM | POA: Insufficient documentation

## 2015-03-19 DIAGNOSIS — G4730 Sleep apnea, unspecified: Secondary | ICD-10-CM | POA: Insufficient documentation

## 2015-03-19 DIAGNOSIS — I10 Essential (primary) hypertension: Secondary | ICD-10-CM | POA: Insufficient documentation

## 2015-03-19 DIAGNOSIS — Y9289 Other specified places as the place of occurrence of the external cause: Secondary | ICD-10-CM | POA: Diagnosis not present

## 2015-03-19 DIAGNOSIS — Z7982 Long term (current) use of aspirin: Secondary | ICD-10-CM | POA: Diagnosis not present

## 2015-03-19 DIAGNOSIS — F419 Anxiety disorder, unspecified: Secondary | ICD-10-CM | POA: Diagnosis not present

## 2015-03-19 DIAGNOSIS — Y9389 Activity, other specified: Secondary | ICD-10-CM | POA: Insufficient documentation

## 2015-03-19 DIAGNOSIS — Z9861 Coronary angioplasty status: Secondary | ICD-10-CM | POA: Insufficient documentation

## 2015-03-19 DIAGNOSIS — S99921A Unspecified injury of right foot, initial encounter: Secondary | ICD-10-CM | POA: Diagnosis present

## 2015-03-19 MED ORDER — SULFAMETHOXAZOLE-TRIMETHOPRIM 800-160 MG PO TABS: 1.0000 | ORAL_TABLET | Freq: Two times a day (BID) | ORAL | Status: AC

## 2015-03-19 MED ORDER — CEPHALEXIN 500 MG PO CAPS: 500.0000 mg | ORAL_CAPSULE | Freq: Four times a day (QID) | ORAL | Status: DC

## 2015-03-19 NOTE — ED Notes (Signed)
Pt. reports right great toe nail infection for several weeks , denies fever , no drainage.

## 2015-03-19 NOTE — ED Provider Notes (Signed)
CSN: [id number]     Arrival date & time 03/19/15  2242 History  This chart was scribed for Trixie Dredge, PA-C working Gerhard Munch, MD by Evon Slack, ED Scribe. This patient was seen in room TR07C/TR07C and the patient's care was started at 10:50 PM.     Chief Complaint  Patient presents with  . Nail Problem   The history is provided by the patient. No language interpreter was used.   HPI Comments: [name] is a 64 y.o. male with PMHx of DM who presents to the Emergency Department complaining of right great toe injury onset today. Pt states that the toe nail has been close to coming off for about 2 weeks. Pt states that today he was his sock off tonight and the toe nail is partially ripped off. Pt denies any pain or discharge.  States that his toenail usually falls off once a year.  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 Pt denies cough, SOB, leg swelling,  or fevers. Pt states that his blood sugars have been recently well controlled. Pt report being recently diagnosed with shingles.  He does have a podiatrist and can call them tomorrow for close follow up.  Denies having any problems since left BKA, reports it is healing well.  Orthopedist is Dr Lajoyce Corners.     Past Medical History  Diagnosis Date  . Renal disorder   . Diabetes mellitus without complication   . CPAP (continuous positive airway pressure) dependence   . Sleep apnea   . Legally blind in left eye, as defined in [place]   . PTSD (post-traumatic stress disorder)   . PVD (peripheral vascular disease) 01/17/2015  . Hypertension   . Anxiety   . GERD (gastroesophageal reflux disease)    Past Surgical History  Procedure Laterality Date  . Combined kidney-pancreas transplant Left 2009  . Incision and drainage abscess Left 01/13/2015    Procedure: INCISION AND DRAINAGE OF LEFT FOOT FIRST RAY ABCESS;  Surgeon: Kathryne Hitch, MD;  Location: [facility] OR;  Service: Orthopedics;  Laterality: Left;  .  Abdominal aortagram N/A 01/21/2015    Procedure: ABDOMINAL Ronny Flurry;  Surgeon: Chuck Hint, MD;  Location: [facility] CATH LAB;  Service: Cardiovascular;  Laterality: N/A;  . Lower extremity angiogram Left 01/21/2015    Procedure: LOWER EXTREMITY ANGIOGRAM;  Surgeon: Chuck Hint, MD;  Location: [facility] CATH LAB;  Service: Cardiovascular;  Laterality: Left;  . Amputation Left 01/23/2015    Procedure: FIRST RAY AMPUTATION/LEFT FOOT;  Surgeon: Nadara Mustard, MD;  Location: [facility] OR;  Service: Orthopedics;  Laterality: Left;  . Av fistula placement    . Colonoscopy    . Amputation Left 02/20/2015    Procedure: AMPUTATION BELOW KNEE;  Surgeon: Nadara Mustard, MD;  Location: [facility] OR;  Service: Orthopedics;  Laterality: Left;   No family history on file. History  Substance Use Topics  . Smoking status: Former Games developer  . Smokeless tobacco: Never Used     Comment: quit i 1994ish  . Alcohol Use: Yes     Comment: 1 beer rarely     Review of Systems  Constitutional: Negative for fever and chills.  Respiratory: Negative for cough and shortness of breath.   Cardiovascular: Negative for leg swelling.  Skin: Positive for wound. Negative for color change.  Allergic/Immunologic: Positive for immunocompromised state.  Neurological: Negative for weakness and numbness.  Psychiatric/Behavioral: Negative for self-injury.      Allergies  Review of  patient's allergies indicates no known allergies.  Home Medications   Prior to Admission medications   Medication Sig Start Date End Date Taking? Authorizing Provider  acyclovir (ZOVIRAX) 800 MG tablet Take 1 tablet (800 mg total) by mouth 5 (five) times daily. 03/14/15   Mirian Mo, MD  allopurinol (ZYLOPRIM) 300 MG tablet Take 300 mg by mouth 2 (two) times daily.     Historical Provider, MD  amLODipine (NORVASC) 10 MG tablet Take 10 mg by mouth every morning.    Historical Provider, MD  aspirin EC 325 MG EC tablet Take 1 tablet (325 mg total) by mouth  daily. 03/05/15   Jacquelynn Cree, PA-C  docusate sodium (COLACE) 100 MG capsule Take 100 mg by mouth 2 (two) times daily as needed for mild constipation.     Historical Provider, MD  gabapentin (NEURONTIN) 300 MG capsule Take 1 capsule (300 mg total) by mouth 2 (two) times daily. 03/05/15   Evlyn Kanner Love, PA-C  glipiZIDE (GLUCOTROL) 10 MG tablet Take 10 mg by mouth daily before breakfast.    Historical Provider, MD  Insulin Detemir (LEVEMIR) 100 UNIT/ML Pen Inject 8 Units into the skin daily at 10 pm. 03/05/15   Evlyn Kanner Love, PA-C  Lancets (FREESTYLE) lancets Use as instructed 01/25/15   Richarda Overlie, MD  mirtazapine (REMERON) 15 MG tablet Take 7.5 mg by mouth at bedtime as needed (insomnia).    Historical Provider, MD  mycophenolate (CELLCEPT) 250 MG capsule Take 1,000 mg by mouth 2 (two) times daily.    Historical Provider, MD  oxyCODONE 10 MG TABS Take 0.5-1 tablets (5-10 mg total) by mouth every 6 (six) hours as needed for moderate pain. 03/05/15   Jacquelynn Cree, PA-C  oxyCODONE-acetaminophen (PERCOCET/ROXICET) 5-325 MG per tablet Take 1-2 tablets by mouth every 4 (four) hours as needed for severe pain. 03/14/15   Mirian Mo, MD  pantoprazole (PROTONIX) 40 MG tablet Take 1 tablet (40 mg total) by mouth daily. 01/25/15   Richarda Overlie, MD  prazosin (MINIPRESS) 2 MG capsule Take 6 mg by mouth at bedtime.    Historical Provider, MD  simvastatin (ZOCOR) 40 MG tablet Take 20 mg by mouth at bedtime.    Historical Provider, MD  tacrolimus (PROGRAF) 1 MG capsule Take 5 mg by mouth 2 (two) times daily.     Historical Provider, MD   BP 123/72 mmHg  Pulse 81  Temp(Src) 98.2 F (36.8 C) (Oral)  Resp 16  SpO2 100%   Physical Exam  Constitutional: He appears well-developed and well-nourished. No distress.  HENT:  Head: Normocephalic and atraumatic.  Neck: Neck supple.  Pulmonary/Chest: Effort normal.  Musculoskeletal:  Left BKA with staples in place no erythema, warmth, tenderness or discharge.  Right  great toe with chronically thickened nail attached only at the base dried blood over the nail bed no erythema, edema, warmth or discoloration of the toe, non tender. Full AROM of great toe without difficulty.  Right foot with no tenderness throughout.    Neurological: He is alert.  Skin: He is not diaphoretic.  Nursing note and vitals reviewed.   ED Course  Procedures (including critical care time) DIAGNOSTIC STUDIES: Oxygen Saturation is 100% on RA, normal by my interpretation.    COORDINATION OF CARE: 11:09 PM-Discussed treatment plan with pt at bedside and pt agreed to plan.     Labs Review Labs Reviewed - No data to display  Imaging Review No results found.   EKG Interpretation None  MDM   Final diagnoses:  Toenail avulsion, initial encounter   Afebrile, nontoxic patient with great toe nail mostly avulsed.  He has onychomycosis and states this toenail falls off annually.  It currently exhibits no signs of infection but patient just lost his left lower leg to infection and was concerned that he was at risk for infection and wanted to make sure this did not occur.  He did not have any trauma to the foot.  No systemic symptoms.  No other concerns at this time.  Healing well from recent surgery.  States he would be able to call his podiatrist first thing in the morning and see them in close follow up.  I did not remove the nail as it seems to be a chronic problem that has occurred slowly and spontaneously and it seems it may be safer for the nail to fall off naturally or be removed by podiatry as I do not want to create more of an opportunity for infection by cutting into his toe.  D/C home with keflex, bactrim, podiatry follow up.  Discussed result, findings, treatment, and follow up  with patient.  Pt given return precautions.  Pt verbalizes understanding and agrees with plan.        I personally performed the services described in this documentation, which was scribed in my  presence. The recorded information has been reviewed and is accurate.      Trixie Dredge, PA-C 03/20/15 0132  Gerhard Munch, MD 03/20/15 [phone]

## 2015-03-19 NOTE — Discharge Instructions (Signed)
Read the information below.  Use the prescribed medication as directed.  Please discuss all new medications with your pharmacist.  You may return to the Emergency Department at any time for worsening condition or any new symptoms that concern you.   If you develop redness, swelling, pus draining from the wound, pain, or fevers greater than 100.4, return to the ER immediately for a recheck.     Fingernail or Toenail Loss All or part of your fingernail or toenail has been lost. This may or may not grow back as a normal nail. A special non-stick bandage has been put on your finger or toe tightly to prevent bleeding. HOME CARE INSTRUCTIONS  The tips of fingers and toes are full of nerves and injuries are often very painful. The following will help you decrease the pain and obtain the best outcome.  Keep your hand or foot elevated above your heart to relieve pain and swelling. This will require lying in bed or on a couch with the hand or leg on pillows or sitting in a recliner with the leg up. Letting your hand or leg dangle may increase swelling, slow healing and cause throbbing pain.  Keep your dressing dry and clean.  Change your bandage in 24 hours after going home.  After your bandage is changed, soak your hand or foot in warm soapy water for 10 to 20 minutes. Do this 3 times per day. This helps reduce pain and swelling. After soaking, apply a clean, dry bandage. Change your bandage if it is wet or dirty.  Only take over-the-counter or prescription medicines for pain, discomfort, or fever as directed by your caregiver.  See your caregiver as needed for problems. SEEK IMMEDIATE MEDICAL CARE IF:   You have increased pain, swelling, drainage, or bleeding.  You have a fever. MAKE SURE YOU:   Understand these instructions.  Will watch your condition.  Will get help right away if you are not doing well or get worse. Document Released: 08/06/2006 Document Revised: 12/07/2011 Document  Reviewed: 10/26/2006 [facility] Patient Information 2015 [place], [place]. This information is not intended to replace advice given to you by your health care provider. Make sure you discuss any questions you have with your health care provider.

## 2015-03-21 DIAGNOSIS — I10 Essential (primary) hypertension: Secondary | ICD-10-CM | POA: Diagnosis not present

## 2015-03-21 DIAGNOSIS — Z89512 Acquired absence of left leg below knee: Secondary | ICD-10-CM | POA: Diagnosis not present

## 2015-03-21 DIAGNOSIS — G4733 Obstructive sleep apnea (adult) (pediatric): Secondary | ICD-10-CM | POA: Diagnosis not present

## 2015-03-21 DIAGNOSIS — Z4781 Encounter for orthopedic aftercare following surgical amputation: Secondary | ICD-10-CM | POA: Diagnosis not present

## 2015-03-21 DIAGNOSIS — E11319 Type 2 diabetes mellitus with unspecified diabetic retinopathy without macular edema: Secondary | ICD-10-CM | POA: Diagnosis not present

## 2015-03-21 DIAGNOSIS — I739 Peripheral vascular disease, unspecified: Secondary | ICD-10-CM | POA: Diagnosis not present

## 2015-03-25 DIAGNOSIS — E11319 Type 2 diabetes mellitus with unspecified diabetic retinopathy without macular edema: Secondary | ICD-10-CM | POA: Diagnosis not present

## 2015-03-25 DIAGNOSIS — Z4781 Encounter for orthopedic aftercare following surgical amputation: Secondary | ICD-10-CM | POA: Diagnosis not present

## 2015-03-25 DIAGNOSIS — I739 Peripheral vascular disease, unspecified: Secondary | ICD-10-CM | POA: Diagnosis not present

## 2015-03-25 DIAGNOSIS — I10 Essential (primary) hypertension: Secondary | ICD-10-CM | POA: Diagnosis not present

## 2015-03-25 DIAGNOSIS — Z89512 Acquired absence of left leg below knee: Secondary | ICD-10-CM | POA: Diagnosis not present

## 2015-03-25 DIAGNOSIS — G4733 Obstructive sleep apnea (adult) (pediatric): Secondary | ICD-10-CM | POA: Diagnosis not present

## 2015-03-27 DIAGNOSIS — I739 Peripheral vascular disease, unspecified: Secondary | ICD-10-CM | POA: Diagnosis not present

## 2015-03-27 DIAGNOSIS — Z89512 Acquired absence of left leg below knee: Secondary | ICD-10-CM | POA: Diagnosis not present

## 2015-03-27 DIAGNOSIS — G4733 Obstructive sleep apnea (adult) (pediatric): Secondary | ICD-10-CM | POA: Diagnosis not present

## 2015-03-27 DIAGNOSIS — Z4781 Encounter for orthopedic aftercare following surgical amputation: Secondary | ICD-10-CM | POA: Diagnosis not present

## 2015-03-27 DIAGNOSIS — E11319 Type 2 diabetes mellitus with unspecified diabetic retinopathy without macular edema: Secondary | ICD-10-CM | POA: Diagnosis not present

## 2015-03-27 DIAGNOSIS — I10 Essential (primary) hypertension: Secondary | ICD-10-CM | POA: Diagnosis not present

## 2015-04-03 DIAGNOSIS — I739 Peripheral vascular disease, unspecified: Secondary | ICD-10-CM | POA: Diagnosis not present

## 2015-04-03 DIAGNOSIS — Z89512 Acquired absence of left leg below knee: Secondary | ICD-10-CM | POA: Diagnosis not present

## 2015-04-03 DIAGNOSIS — E11319 Type 2 diabetes mellitus with unspecified diabetic retinopathy without macular edema: Secondary | ICD-10-CM | POA: Diagnosis not present

## 2015-04-03 DIAGNOSIS — I10 Essential (primary) hypertension: Secondary | ICD-10-CM | POA: Diagnosis not present

## 2015-04-03 DIAGNOSIS — G4733 Obstructive sleep apnea (adult) (pediatric): Secondary | ICD-10-CM | POA: Diagnosis not present

## 2015-04-03 DIAGNOSIS — Z4781 Encounter for orthopedic aftercare following surgical amputation: Secondary | ICD-10-CM | POA: Diagnosis not present

## 2015-04-10 DIAGNOSIS — Z89512 Acquired absence of left leg below knee: Secondary | ICD-10-CM | POA: Diagnosis not present

## 2015-04-10 DIAGNOSIS — I739 Peripheral vascular disease, unspecified: Secondary | ICD-10-CM | POA: Diagnosis not present

## 2015-04-10 DIAGNOSIS — G4733 Obstructive sleep apnea (adult) (pediatric): Secondary | ICD-10-CM | POA: Diagnosis not present

## 2015-04-10 DIAGNOSIS — Z4781 Encounter for orthopedic aftercare following surgical amputation: Secondary | ICD-10-CM | POA: Diagnosis not present

## 2015-04-10 DIAGNOSIS — I10 Essential (primary) hypertension: Secondary | ICD-10-CM | POA: Diagnosis not present

## 2015-04-10 DIAGNOSIS — E11319 Type 2 diabetes mellitus with unspecified diabetic retinopathy without macular edema: Secondary | ICD-10-CM | POA: Diagnosis not present

## 2015-04-19 DIAGNOSIS — E11319 Type 2 diabetes mellitus with unspecified diabetic retinopathy without macular edema: Secondary | ICD-10-CM | POA: Diagnosis not present

## 2015-04-19 DIAGNOSIS — G4733 Obstructive sleep apnea (adult) (pediatric): Secondary | ICD-10-CM | POA: Diagnosis not present

## 2015-04-19 DIAGNOSIS — Z4781 Encounter for orthopedic aftercare following surgical amputation: Secondary | ICD-10-CM | POA: Diagnosis not present

## 2015-04-19 DIAGNOSIS — Z89512 Acquired absence of left leg below knee: Secondary | ICD-10-CM | POA: Diagnosis not present

## 2015-04-19 DIAGNOSIS — I739 Peripheral vascular disease, unspecified: Secondary | ICD-10-CM | POA: Diagnosis not present

## 2015-04-19 DIAGNOSIS — I10 Essential (primary) hypertension: Secondary | ICD-10-CM | POA: Diagnosis not present

## 2015-04-23 DIAGNOSIS — I10 Essential (primary) hypertension: Secondary | ICD-10-CM | POA: Diagnosis not present

## 2015-04-23 DIAGNOSIS — I739 Peripheral vascular disease, unspecified: Secondary | ICD-10-CM | POA: Diagnosis not present

## 2015-04-23 DIAGNOSIS — Z89512 Acquired absence of left leg below knee: Secondary | ICD-10-CM | POA: Diagnosis not present

## 2015-04-23 DIAGNOSIS — E11319 Type 2 diabetes mellitus with unspecified diabetic retinopathy without macular edema: Secondary | ICD-10-CM | POA: Diagnosis not present

## 2015-04-23 DIAGNOSIS — G4733 Obstructive sleep apnea (adult) (pediatric): Secondary | ICD-10-CM | POA: Diagnosis not present

## 2015-04-23 DIAGNOSIS — Z4781 Encounter for orthopedic aftercare following surgical amputation: Secondary | ICD-10-CM | POA: Diagnosis not present

## 2015-05-02 DIAGNOSIS — I739 Peripheral vascular disease, unspecified: Secondary | ICD-10-CM | POA: Diagnosis not present

## 2015-05-02 DIAGNOSIS — Z4781 Encounter for orthopedic aftercare following surgical amputation: Secondary | ICD-10-CM | POA: Diagnosis not present

## 2015-05-02 DIAGNOSIS — G4733 Obstructive sleep apnea (adult) (pediatric): Secondary | ICD-10-CM | POA: Diagnosis not present

## 2015-05-02 DIAGNOSIS — Z89512 Acquired absence of left leg below knee: Secondary | ICD-10-CM | POA: Diagnosis not present

## 2015-05-02 DIAGNOSIS — E11319 Type 2 diabetes mellitus with unspecified diabetic retinopathy without macular edema: Secondary | ICD-10-CM | POA: Diagnosis not present

## 2015-05-02 DIAGNOSIS — I10 Essential (primary) hypertension: Secondary | ICD-10-CM | POA: Diagnosis not present

## 2015-05-15 DIAGNOSIS — M109 Gout, unspecified: Secondary | ICD-10-CM | POA: Diagnosis not present

## 2015-05-15 DIAGNOSIS — I10 Essential (primary) hypertension: Secondary | ICD-10-CM | POA: Diagnosis not present

## 2015-05-15 DIAGNOSIS — Z8639 Personal history of other endocrine, nutritional and metabolic disease: Secondary | ICD-10-CM | POA: Diagnosis not present

## 2015-05-15 DIAGNOSIS — E11359 Type 2 diabetes mellitus with proliferative diabetic retinopathy without macular edema: Secondary | ICD-10-CM | POA: Diagnosis not present

## 2015-05-15 DIAGNOSIS — E785 Hyperlipidemia, unspecified: Secondary | ICD-10-CM | POA: Diagnosis not present

## 2015-05-15 DIAGNOSIS — Z940 Kidney transplant status: Secondary | ICD-10-CM | POA: Diagnosis not present

## 2015-05-22 ENCOUNTER — Encounter: Payer: Medicare Other | Attending: Physical Medicine & Rehabilitation | Admitting: Physical Medicine & Rehabilitation

## 2015-05-23 ENCOUNTER — Ambulatory Visit: Payer: Medicare Other | Attending: Orthopedic Surgery | Admitting: Physical Therapy

## 2015-05-23 ENCOUNTER — Encounter: Payer: Self-pay | Admitting: Physical Therapy

## 2015-05-23 DIAGNOSIS — Z5189 Encounter for other specified aftercare: Secondary | ICD-10-CM | POA: Insufficient documentation

## 2015-05-23 DIAGNOSIS — R2681 Unsteadiness on feet: Secondary | ICD-10-CM | POA: Insufficient documentation

## 2015-05-23 DIAGNOSIS — Z89512 Acquired absence of left leg below knee: Secondary | ICD-10-CM | POA: Diagnosis not present

## 2015-05-23 DIAGNOSIS — R2689 Other abnormalities of gait and mobility: Secondary | ICD-10-CM

## 2015-05-23 DIAGNOSIS — R29818 Other symptoms and signs involving the nervous system: Secondary | ICD-10-CM | POA: Diagnosis not present

## 2015-05-23 DIAGNOSIS — R6889 Other general symptoms and signs: Secondary | ICD-10-CM | POA: Diagnosis not present

## 2015-05-23 DIAGNOSIS — Z4789 Encounter for other orthopedic aftercare: Secondary | ICD-10-CM

## 2015-05-23 DIAGNOSIS — R269 Unspecified abnormalities of gait and mobility: Secondary | ICD-10-CM | POA: Insufficient documentation

## 2015-05-24 NOTE — Therapy (Signed)
[facility] [address] [place], [place], [address] Phone: [phone]   Fax:  [phone]  Physical Therapy Evaluation  Patient Details  Name: [name] MRN: [id number] Date of Birth: [birth year] Referring Provider:  Nadara Mustard, MD  Encounter Date: 05/23/2015      PT End of Session - 05/23/15 1400    Visit Number 1   Number of Visits 17   Date for PT Re-Evaluation 07/22/15   Authorization Type G-Code every 10th visit   PT Start Time 1400   PT Stop Time 1445   PT Time Calculation (min) 45 min   Equipment Utilized During Treatment Gait belt   Activity Tolerance Patient tolerated treatment well   Behavior During Therapy [facility] for tasks assessed/performed      Past Medical History  Diagnosis Date  . Renal disorder   . Diabetes mellitus without complication   . CPAP (continuous positive airway pressure) dependence   . Sleep apnea   . Legally blind in left eye, as defined in [place]   . PTSD (post-traumatic stress disorder)   . PVD (peripheral vascular disease) 01/17/2015  . Hypertension   . Anxiety   . GERD (gastroesophageal reflux disease)     Past Surgical History  Procedure Laterality Date  . Combined kidney-pancreas transplant Left 2009  . Incision and drainage abscess Left 01/13/2015    Procedure: INCISION AND DRAINAGE OF LEFT FOOT FIRST RAY ABCESS;  Surgeon: Kathryne Hitch, MD;  [facility]: [facility] OR;  Service: Orthopedics;  Laterality: Left;  . Abdominal aortagram N/A 01/21/2015    Procedure: ABDOMINAL Ronny Flurry;  Surgeon: Chuck Hint, MD;  [facility] CATH LAB;  Service: Cardiovascular;  Laterality: N/A;  . Lower extremity angiogram Left 01/21/2015    Procedure: LOWER EXTREMITY ANGIOGRAM;  Surgeon: Chuck Hint, MD;  Location: [facility] CATH LAB;  Service: Cardiovascular;  Laterality: Left;  . Amputation Left 01/23/2015    Procedure: FIRST RAY AMPUTATION/LEFT FOOT;  Surgeon: Nadara Mustard, MD;  Location: [facility] OR;  Service: Orthopedics;  Laterality: Left;  . Av fistula placement    . Colonoscopy    . Amputation Left 02/20/2015    Procedure: AMPUTATION BELOW KNEE;  Surgeon: Nadara Mustard, MD;  Location: [facility] OR;  Service: Orthopedics;  Laterality: Left;    There were no vitals filed for this visit.  Visit Diagnosis:  Abnormality of gait  Unsteadiness  Balance problems  Decreased functional activity tolerance  Status post below knee amputation of left lower extremity  Encounter for prosthetic gait training      Subjective Assessment - 05/23/15 1410    Subjective This 64yo male who underwent a left Transtibial Amputation 02/20/2015 due to wound infected. He recieved his first prosthesis ~3 weeks ago. He presents for PT evaluation with new prosthesis.    Patient Stated Goals To use prosthesis to walk in community, active including exercising   Currently in Pain? No/denies            [facility] PT Assessment - 05/23/15 1400    Assessment   Medical Diagnosis Left Transtibial amputation   Onset Date/Surgical Date 05/08/15   Precautions   Precautions Fall;Other (comment)  NO BP RUE   Restrictions   Weight Bearing Restrictions No   Balance Screen   Has the patient fallen in the past 6 months Yes   How many times? 2  flipped w/c 1x & jump up on porch   Has the patient had a decrease in  activity level because of a fear of falling?  No   Is the patient reluctant to leave their home because of a fear of falling?  No   Home Environment   Living Environment Private residence   Living Arrangements Spouse/significant other;Children  25yo step son, 3 children 4yo, 4yo & 2yo   Type of Home House   Home Access Stairs to enter   Entrance Stairs-Number of Steps 1   Entrance Stairs-Rails None   Home Layout One level  2 steps right rail from [employer] - single point;Walker - 2 wheels;Bedside commode;Tub bench;Hand held shower head;Wheelchair - manual   Prior  Function   Level of Independence Independent;Independent with community mobility with device;Independent with household mobility with device;Independent with gait;Independent with transfers  for last year he was using cane in home & community   Leisure exercising lifting weights, volleyball, basketball   Observation/Other Assessments   Focus on Therapeutic Outcomes (FOTO)  47.85  Functional Status   Fear Avoidance Belief Questionnaire (FABQ)  39%   Posture/Postural Control   Posture/Postural Control Postural limitations   Postural Limitations Forward head;Weight shift right   ROM / Strength   AROM / PROM / Strength AROM;Strength   AROM   Overall AROM  Within functional limits for tasks performed   Strength   Overall Strength Within functional limits for tasks performed   Overall Strength Comments seated gross testing UEs & LEs 5/5   Transfers   Transfers Sit to Stand;Stand to Sit   Sit to Stand 5: Supervision;Without upper extremity assist;From chair/3-in-1  supervision to steady balance first 5 seconds   Stand to Sit 5: Supervision;Without upper extremity assist;To chair/3-in-1  minimal wt on prosthesis   Ambulation/Gait   Ambulation/Gait Yes   Ambulation/Gait Assistance 5: Supervision   Ambulation Distance (Feet) 150 Feet   Assistive device Prosthesis;Straight cane   Gait Pattern Step-through pattern;Decreased step length - right;Decreased stance time - left;Decreased stride length;Decreased hip/knee flexion - left;Decreased weight shift to left;Antalgic;Trunk flexed;Abducted - left   Ambulation Surface Indoor;Level   Gait velocity 1.52   Standardized Balance Assessment   Standardized Balance Assessment Berg Balance Test;Timed Up and Go Test   Berg Balance Test   Sit to Stand Able to stand without using hands and stabilize independently   Standing Unsupported Able to stand safely 2 minutes   Sitting with Back Unsupported but Feet Supported on Floor or Stool Able to sit safely  and securely 2 minutes   Stand to Sit Sits safely with minimal use of hands   Transfers Able to transfer safely, minor use of hands   Standing Unsupported with Eyes Closed Able to stand 10 seconds with supervision   Standing Ubsupported with Feet Together Able to place feet together independently but unable to hold for 30 seconds   From Standing, Reach Forward with Outstretched Arm Can reach forward >12 cm safely (5")   From Standing Position, Pick up Object from Floor Able to pick up shoe, needs supervision   From Standing Position, Turn to Look Behind Over each Shoulder Looks behind one side only/other side shows less weight shift   Turn 360 Degrees Needs close supervision or verbal cueing   Standing Unsupported, Alternately Place Feet on Step/Stool Able to complete >2 steps/needs minimal assist   Standing Unsupported, One Foot in Front Able to take small step independently and hold 30 seconds   Standing on One Leg Tries to lift leg/unable to hold 3 seconds but  remains standing independently   Total Score 39   Timed Up and Go Test   Normal TUG (seconds) 17.88  17.88sec with cane, 20.67sec without device except prosthesi         Prosthetics Assessment - 05/23/15 1400    Prosthetics   Prosthetic Care Dependent with Skin check;Residual limb care;Care of non-amputated limb;Prosthetic cleaning;Ply sock cleaning;Correct ply sock adjustment;Proper wear schedule/adjustment;Proper weight-bearing schedule/adjustment   Donning prosthesis  Supervision   Doffing prosthesis  Supervision   Current prosthetic wear tolerance (days/week)  daily since delivery 15 days ago   Current prosthetic wear tolerance (#hours/day)  1 hr 3x/day until 1 day ago then 2hrs 3x/day   Current prosthetic weight-bearing tolerance (hours/day)  10 min with no complaint of discomfort with partial wt on prosthesis   Edema none noted   Residual limb condition  no open areas, incision healed with no adherance, normal hair growth,  color, temperature & moisture    K code/activity level with prosthetic use  3                  [facility] Adult PT Treatment/Exercise - 05/23/15 1400    Prosthetics   Education Provided Skin check;Residual limb care;Prosthetic cleaning;Correct ply sock adjustment;Proper Donning;Proper Doffing;Proper wear schedule/adjustment;Proper weight-bearing schedule/adjustment  wear 2 hrs 3x/day for 5 days then increase to 3hrs 3x/day   Person(s) Educated Patient   Education Method Explanation;Demonstration;Tactile cues;Verbal cues   Education Method Verbalized understanding;Returned demonstration;Tactile cues required;Verbal cues required;Needs further instruction                  PT Short Term Goals - 05/23/15 1400    PT SHORT TERM GOAL #1   Title Patient verbalizes understanding of prosthetic care with minimal cues for advanced issues like adjusting ply socks. (Target Date: 06/21/2015)   Time 4   Period Weeks   Status New   PT SHORT TERM GOAL #2   Title Patient tolerates wear >12hrs total per day without skin issues or discomfort.  (Target Date: 06/21/2015)   Time 4   Period Weeks   Status New   PT SHORT TERM GOAL #3   Title Berg Balance >45/56  (Target Date: 06/21/2015)   Time 4   Period Weeks   Status New   PT SHORT TERM GOAL #4   Title Patient ambulates 400' with single point cane & prosthesis with only cues for deviations with no balance losses.  (Target Date: 06/21/2015)   Time 4   Period Weeks   Status New   PT SHORT TERM GOAL #5   Title Patient negoitates ramps, curbs & stairs with single point cane & prosthesis with cues only no physical assist.  (Target Date: 06/21/2015)   Time 4   Period Weeks   Status New           PT Long Term Goals - 05/23/15 1400    PT LONG TERM GOAL #1   Title Patient demonstrates & verbalizes all aspects of prosthetic care correctly to enable safe use of prosthesis.  (Target Date: 07/19/2015)   Time 8   Period Weeks   Status New    PT LONG TERM GOAL #2   Title Patient toelrates wear of prosthesis >90% of awake hours without skin issues or discomfort to enable functional use throughout his day. (Target Date: 07/19/2015)   Time 8   Period Weeks   Status New   PT LONG TERM GOAL #3   Title Functional Gait Assessment >24/30 with prosthesis (  Target Date: 07/19/2015)   Time 8   Period Weeks   Status New   PT LONG TERM GOAL #4   Title Patient ambulates >1000' including grass, ramps, curbs & stairs with single point cane or less and prosthesis modified independent. (Target Date: 07/19/2015)   Time 8   Period Weeks   Status New   PT LONG TERM GOAL #5   Title Patient lifts & carries full basket of laundry around furniture with only prosthesis modified independent. (Target Date: 07/19/2015)   Time 8   Period Weeks   Status New               Plan - 2015/06/21 1400    Clinical Impression Statement This 64yo male underwent a left Transtibial Amputation on 02/20/2015. He has had decreased mobiity for ~1 year due to a wound with deconditioning. He recieved his frist prosthesis on 05/08/2015. He is dependent in use & care which places him at risk for skin issues & pain/ discomfort from improper use. He is only wearing the prosthesis 3-6hrs total during his day which limits his functional mobilty especially when he has prosthesis off. His Berg Balance score of 39/56, TImed Up & Go of 17.88sec or 20.67sec both and gait velocity of 1.33ft/sec all indicate high fall risk. He has gait deviations that negatively impact safety & energy efficiency of gait and he is dependent in barriers and higher level mobility acitivities.                              Pt will benefit from skilled therapeutic intervention in order to improve on the following deficits Abnormal gait;Decreased activity tolerance;Decreased balance;Decreased endurance;Decreased mobility;Postural dysfunction;Prosthetic Dependency   Rehab Potential Good   PT Frequency 2x / week    PT Duration 8 weeks   PT Treatment/Interventions ADLs/Self Care Home Management;DME Instruction;Gait training;Stair training;Functional mobility training;Therapeutic activities;Therapeutic exercise;Balance training;Neuromuscular re-education;Patient/family education;Prosthetic Training   PT Next Visit Plan review prosthetic care, gait with cane on barriers   PT Home Exercise Plan standing midline HEP   Consulted and Agree with Plan of Care Patient          G-Codes - 06/21/2015 1400    Functional Assessment Tool Used Patient is dependent in prosthetic care. Patient wears prosthesis daily for 1hr 3x/day for first 14 days and 2hr 3x/day for 1 day only.    Functional Limitation Self care   Self Care Current Status [phone]) At least 60 percent but less than 80 percent impaired, limited or restricted   Self Care Goal Status ([id number]) At least 1 percent but less than 20 percent impaired, limited or restricted       Problem List Patient Active Problem List   Diagnosis Date Noted  . Status post below knee amputation of left lower extremity 02/22/2015  . S/P BKA (below knee amputation) unilateral 02/20/2015  . Ischemic ulcer of toe   . Peripheral vascular disease   . PVD (peripheral vascular disease) 01/17/2015  . Cellulitis and abscess of foot 01/13/2015  . Cellulitis and abscess of foot excluding toe 01/13/2015  . Diabetes mellitus without complication 01/13/2015  . Leukocytosis 01/13/2015  . Diabetes 12/19/2007  . DYSLIPIDEMIA 12/19/2007  . ANEMIA 12/19/2007  . ANXIETY 12/19/2007  . Essential hypertension 12/19/2007  . TRANSIENT ISCHEMIC ATTACK 12/19/2007  . PERIPHERAL VASCULAR DISEASE 12/19/2007  . RENAL FAILURE 12/19/2007    Vladimir Faster PT, DPT 05/24/2015, 2:50 PM  [facility]  [place] [address] [place], [place], [address] Phone: [phone]   Fax:  [phone]

## 2015-06-05 ENCOUNTER — Ambulatory Visit: Payer: Medicare Other | Attending: Orthopedic Surgery | Admitting: Physical Therapy

## 2015-06-05 ENCOUNTER — Encounter: Payer: Self-pay | Admitting: Physical Therapy

## 2015-06-05 DIAGNOSIS — Z5189 Encounter for other specified aftercare: Secondary | ICD-10-CM | POA: Diagnosis not present

## 2015-06-05 DIAGNOSIS — R2681 Unsteadiness on feet: Secondary | ICD-10-CM | POA: Diagnosis not present

## 2015-06-05 DIAGNOSIS — R2689 Other abnormalities of gait and mobility: Secondary | ICD-10-CM

## 2015-06-05 DIAGNOSIS — Z4789 Encounter for other orthopedic aftercare: Secondary | ICD-10-CM

## 2015-06-05 DIAGNOSIS — R29818 Other symptoms and signs involving the nervous system: Secondary | ICD-10-CM | POA: Insufficient documentation

## 2015-06-05 DIAGNOSIS — R6889 Other general symptoms and signs: Secondary | ICD-10-CM | POA: Insufficient documentation

## 2015-06-05 DIAGNOSIS — Z89512 Acquired absence of left leg below knee: Secondary | ICD-10-CM | POA: Insufficient documentation

## 2015-06-05 DIAGNOSIS — R269 Unspecified abnormalities of gait and mobility: Secondary | ICD-10-CM | POA: Insufficient documentation

## 2015-06-09 NOTE — Therapy (Signed)
[facility] [address] [place], [place], [address] Phone: [phone]   Fax:  [phone]  Physical Therapy Treatment  Patient Details  Name: [name] MRN: [id number] Date of Birth: Feb 10, 1951 Referring Provider:  Nadara Mustard, MD  Encounter Date: 06/05/2015   06/05/15 1615  PT Visits / Re-Eval  Visit Number 2  Number of Visits 17  Date for PT Re-Evaluation 07/22/15  Authorization  Authorization Type G-Code every 10th visit  PT Time Calculation  PT Start Time 1542 (pt late for apt today)  PT Stop Time 1615  PT Time Calculation (min) 33 min  PT - End of Session  Equipment Utilized During Treatment Gait belt  Activity Tolerance Patient tolerated treatment well  Behavior During Therapy [facility] for tasks assessed/performed     Past Medical History  Diagnosis Date  . Renal disorder   . Diabetes mellitus without complication   . CPAP (continuous positive airway pressure) dependence   . Sleep apnea   . Legally blind in left eye, as defined in [place]   . PTSD (post-traumatic stress disorder)   . PVD (peripheral vascular disease) 01/17/2015  . Hypertension   . Anxiety   . GERD (gastroesophageal reflux disease)     Past Surgical History  Procedure Laterality Date  . Combined kidney-pancreas transplant Left 2009  . Incision and drainage abscess Left 01/13/2015    Procedure: INCISION AND DRAINAGE OF LEFT FOOT FIRST RAY ABCESS;  Surgeon: Kathryne Hitch, MD;  Location: [facility] OR;  Service: Orthopedics;  Laterality: Left;  . Abdominal aortagram N/A 01/21/2015    Procedure: ABDOMINAL Ronny Flurry;  Surgeon: Chuck Hint, MD;  Location: [facility] CATH LAB;  Service: Cardiovascular;  Laterality: N/A;  . Lower extremity angiogram Left 01/21/2015    Procedure: LOWER EXTREMITY ANGIOGRAM;  Surgeon: Chuck Hint, MD;  Location: [facility] CATH LAB;  Service: Cardiovascular;  Laterality: Left;  . Amputation Left 01/23/2015    Procedure: FIRST RAY AMPUTATION/LEFT FOOT;  Surgeon: Nadara Mustard, MD;  Location: [facility] OR;  Service: Orthopedics;  Laterality: Left;  . Av fistula placement    . Colonoscopy    . Amputation Left 02/20/2015    Procedure: AMPUTATION BELOW KNEE;  Surgeon: Nadara Mustard, MD;  Location: [facility] OR;  Service: Orthopedics;  Laterality: Left;    There were no vitals filed for this visit.  Visit Diagnosis:  Abnormality of gait  Unsteadiness  Balance problems  Decreased functional activity tolerance  Encounter for prosthetic gait training     06/05/15 1545  Symptoms/Limitations  Subjective No new complaints. No falls or pain to report. Pt now using straight cane with antalgic gait pattern on arrival to session today.  Pain Assessment  Currently in Pain? No/denies      06/05/15 1547  Transfers  Sit to Stand 5: Supervision;Without upper extremity assist;From chair/3-in-1  Stand to Sit 5: Supervision;Without upper extremity assist;To chair/3-in-1  Ambulation/Gait  Ambulation/Gait Yes  Ambulation/Gait Assistance 5: Supervision  Ambulation/Gait Assistance Details cues for increased/equal step length with gait, for increased stance time on left leg. Pt with decreased trunk rotation and needed cues on cane position as well. Lowered/adjusted cane with improved placment and sequence noted throughout rest of session.                       Ambulation Distance (Feet) 100 Feet (x1, 230 x2)  Assistive device Prosthesis;Straight cane  Gait Pattern Step-through pattern;Decreased step length - right;Decreased stance time -  left;Decreased stride length;Decreased hip/knee flexion - left;Decreased weight shift to left;Antalgic;Trunk flexed;Abducted - left  Ambulation Surface Level;Indoor  Stairs Yes  Stairs Assistance 4: Min guard;5: Supervision  Stairs Assistance Details (indicate cue type and reason) cues on sequence and technique with cane/rail combination  Stair Management Technique One rail Right;Two  rails;One rail Left;Step to pattern;Forwards;With cane  Number of Stairs 4 (x 3 reps)  Ramp Other (comment);4: Min assist (min guard assist with cane/prosthesis)  Ramp Details (indicate cue type and reason) cues on technique and sequence  Curb Other (comment);4: Min assist (with cane/prosthesis, min guard assist)  Curb Details (indicate cue type and reason) cues on sequence and foot position with descending and ascending  Prosthetics  Prosthetic Care Comments  minimal sweating noted when liner/prosthesis removed. dried before redonning. Pt edcuated on importance of drying to prevent blisters/skin integrity.                               Current prosthetic wear tolerance (days/week)  daily  Current prosthetic wear tolerance (#hours/day)  3 hours on, 1-2 hours off, repeat sequence  Residual limb condition  intact, no issues noted.   Education Provided Residual limb care;Proper weight-bearing schedule/adjustment;Proper wear schedule/adjustment  Person(s) Educated Patient  Education Method Explanation;Verbal cues  Education Method Verbalized understanding;Verbal cues required  Donning Prosthesis 5  Doffing Prosthesis 5          PT Short Term Goals - 05/23/15 1400    PT SHORT TERM GOAL #1   Title Patient verbalizes understanding of prosthetic care with minimal cues for advanced issues like adjusting ply socks. (Target Date: 06/21/2015)   Time 4   Period Weeks   Status New   PT SHORT TERM GOAL #2   Title Patient tolerates wear >12hrs total per day without skin issues or discomfort.  (Target Date: 06/21/2015)   Time 4   Period Weeks   Status New   PT SHORT TERM GOAL #3   Title Berg Balance >45/56  (Target Date: 06/21/2015)   Time 4   Period Weeks   Status New   PT SHORT TERM GOAL #4   Title Patient ambulates 400' with single point cane & prosthesis with only cues for deviations with no balance losses.  (Target Date: 06/21/2015)   Time 4   Period Weeks   Status New   PT SHORT TERM  GOAL #5   Title Patient negoitates ramps, curbs & stairs with single point cane & prosthesis with cues only no physical assist.  (Target Date: 06/21/2015)   Time 4   Period Weeks   Status New           PT Long Term Goals - 05/23/15 1400    PT LONG TERM GOAL #1   Title Patient demonstrates & verbalizes all aspects of prosthetic care correctly to enable safe use of prosthesis.  (Target Date: 07/19/2015)   Time 8   Period Weeks   Status New   PT LONG TERM GOAL #2   Title Patient toelrates wear of prosthesis >90% of awake hours without skin issues or discomfort to enable functional use throughout his day. (Target Date: 07/19/2015)   Time 8   Period Weeks   Status New   PT LONG TERM GOAL #3   Title Functional Gait Assessment >24/30 with prosthesis (Target Date: 07/19/2015)   Time 8   Period Weeks   Status New   PT LONG TERM GOAL #4  Title Patient ambulates >1000' including grass, ramps, curbs & stairs with single point cane or less and prosthesis modified independent. (Target Date: 07/19/2015)   Time 8   Period Weeks   Status New   PT LONG TERM GOAL #5   Title Patient lifts & carries full basket of laundry around furniture with only prosthesis modified independent. (Target Date: 07/19/2015)   Time 8   Period Weeks   Status New        06/05/15 1552  Plan  Clinical Impression Statement Pt making steady progress toward goals. Decreased antalgic pattern with gait after cues and cane adjustment in today's session.  Pt will benefit from skilled therapeutic intervention in order to improve on the following deficits Abnormal gait;Decreased activity tolerance;Decreased balance;Decreased endurance;Decreased mobility;Postural dysfunction;Prosthetic Dependency  Rehab Potential Good  PT Frequency 2x / week  PT Duration 8 weeks  PT Treatment/Interventions ADLs/Self Care Home Management;DME Instruction;Gait training;Stair training;Functional mobility training;Therapeutic  activities;Therapeutic exercise;Balance training;Neuromuscular re-education;Patient/family education;Prosthetic Training  PT Next Visit Plan review prosthetic care, gait with cane on barriers  Consulted and Agree with Plan of Care Patient     Problem List Patient Active Problem List   Diagnosis Date Noted  . Status post below knee amputation of left lower extremity 02/22/2015  . S/P BKA (below knee amputation) unilateral 02/20/2015  . Ischemic ulcer of toe   . Peripheral vascular disease   . PVD (peripheral vascular disease) 01/17/2015  . Cellulitis and abscess of foot 01/13/2015  . Cellulitis and abscess of foot excluding toe 01/13/2015  . Diabetes mellitus without complication 01/13/2015  . Leukocytosis 01/13/2015  . Diabetes 12/19/2007  . DYSLIPIDEMIA 12/19/2007  . ANEMIA 12/19/2007  . ANXIETY 12/19/2007  . Essential hypertension 12/19/2007  . TRANSIENT ISCHEMIC ATTACK 12/19/2007  . PERIPHERAL VASCULAR DISEASE 12/19/2007  . RENAL FAILURE 12/19/2007    Sallyanne Kuster 06/09/2015, 11:02 PM  Sallyanne Kuster, PTA, [facility] [address] [place], [place] [address] [phone] 06/09/2015, 11:02 PM

## 2015-06-10 ENCOUNTER — Ambulatory Visit: Payer: Medicare Other | Admitting: Physical Therapy

## 2015-06-10 ENCOUNTER — Encounter: Payer: Self-pay | Admitting: Physical Therapy

## 2015-06-10 DIAGNOSIS — Z89512 Acquired absence of left leg below knee: Secondary | ICD-10-CM | POA: Diagnosis not present

## 2015-06-10 DIAGNOSIS — Z5189 Encounter for other specified aftercare: Secondary | ICD-10-CM | POA: Diagnosis not present

## 2015-06-10 DIAGNOSIS — R6889 Other general symptoms and signs: Secondary | ICD-10-CM

## 2015-06-10 DIAGNOSIS — R2681 Unsteadiness on feet: Secondary | ICD-10-CM

## 2015-06-10 DIAGNOSIS — Z4789 Encounter for other orthopedic aftercare: Secondary | ICD-10-CM

## 2015-06-10 DIAGNOSIS — R269 Unspecified abnormalities of gait and mobility: Secondary | ICD-10-CM

## 2015-06-10 DIAGNOSIS — R2689 Other abnormalities of gait and mobility: Secondary | ICD-10-CM

## 2015-06-10 DIAGNOSIS — R29818 Other symptoms and signs involving the nervous system: Secondary | ICD-10-CM | POA: Diagnosis not present

## 2015-06-10 NOTE — Therapy (Signed)
[facility] [address] [place], [place], [address] Phone: [phone]   Fax:  [phone]  Physical Therapy Treatment  Patient Details  Name: [name] MRN: [id number] Date of Birth: 1951/01/06 Referring Provider:  Nadara Mustard, MD  Encounter Date: 06/10/2015      PT End of Session - 06/10/15 1100    Visit Number 3   Number of Visits 17   Date for PT Re-Evaluation 07/22/15   Authorization Type G-Code every 10th visit   PT Start Time 1100   PT Stop Time 1145   PT Time Calculation (min) 45 min   Equipment Utilized During Treatment Gait belt   Activity Tolerance Patient tolerated treatment well   Behavior During Therapy [facility] for tasks assessed/performed      Past Medical History  Diagnosis Date  . Renal disorder   . Diabetes mellitus without complication   . CPAP (continuous positive airway pressure) dependence   . Sleep apnea   . Legally blind in left eye, as defined in [place]   . PTSD (post-traumatic stress disorder)   . PVD (peripheral vascular disease) 01/17/2015  . Hypertension   . Anxiety   . GERD (gastroesophageal reflux disease)     Past Surgical History  Procedure Laterality Date  . Combined kidney-pancreas transplant Left 2009  . Incision and drainage abscess Left 01/13/2015    Procedure: INCISION AND DRAINAGE OF LEFT FOOT FIRST RAY ABCESS;  Surgeon: Kathryne Hitch, MD;  Location: [facility] OR;  Service: Orthopedics;  Laterality: Left;  . Abdominal aortagram N/A 01/21/2015    Procedure: ABDOMINAL Ronny Flurry;  Surgeon: Chuck Hint, MD;  Location: [facility] CATH LAB;  Service: Cardiovascular;  Laterality: N/A;  . Lower extremity angiogram Left 01/21/2015    Procedure: LOWER EXTREMITY ANGIOGRAM;  Surgeon: Chuck Hint, MD;  Location: [facility] CATH LAB;  Service: Cardiovascular;  Laterality: Left;  . Amputation Left 01/23/2015    Procedure: FIRST RAY AMPUTATION/LEFT FOOT;  Surgeon: Nadara Mustard, MD;  Location: [facility] OR;  Service: Orthopedics;  Laterality: Left;  . Av fistula placement    . Colonoscopy    . Amputation Left 02/20/2015    Procedure: AMPUTATION BELOW KNEE;  Surgeon: Nadara Mustard, MD;  Location: [facility] OR;  Service: Orthopedics;  Laterality: Left;    There were no vitals filed for this visit.  Visit Diagnosis:  Abnormality of gait  Unsteadiness  Balance problems  Decreased functional activity tolerance  Encounter for prosthetic gait training  Status post below knee amputation of left lower extremity      Subjective Assessment - 06/10/15 1100    Subjective Wearing prosthesis 4 hrs 2x/day with drying half way for last 5 days without issues.    Currently in Pain? No/denies                         [facility] Adult PT Treatment/Exercise - 06/10/15 1100    Transfers   Sit to Stand 5: Supervision;Without upper extremity assist;From chair/3-in-1   Stand to Sit 5: Supervision;Without upper extremity assist;To chair/3-in-1   Ambulation/Gait   Ambulation/Gait Yes   Ambulation/Gait Assistance 5: Supervision   Ambulation/Gait Assistance Details visual & verbal cues on step width, initially used line as visual cue for decrease RLE adduction/heel internally rotated and LLE not abducted progressed to proprioception   Ambulation Distance (Feet) 500 Feet  500' X 2   Assistive device Prosthesis;Straight cane   Gait Pattern Step-through pattern;Decreased step  length - right;Decreased stance time - left;Decreased stride length;Decreased hip/knee flexion - left;Decreased weight shift to left;Antalgic;Trunk flexed;Abducted - left   Ambulation Surface Indoor;Level   Stairs Yes   Stairs Assistance 5: Supervision   Stairs Assistance Details (indicate cue type and reason) cues on technique for reciprocal pattern, visual & verbal cues during activity   Stair Management Technique Two rails;Forwards;Alternating pattern   Number of Stairs 4  X 12 reps   Ramp 5:  Supervision  with cane/prosthesis   Ramp Details (indicate cue type and reason) verbal cues on technique for knee control   Curb 5: Supervision  with cane/prosthesis   Curb Details (indicate cue type and reason) verbal & tactile cues on technique for safety   Gait Comments progressed stair gait for 2 steps with 1 rail similar to his home with supervision   Prosthetics   Prosthetic Care Comments  PT instructed in use of Secret on limb to decrease sweating. Pt ambulated with too many, too few and correct ply socks to learn what proper ply feels like in gait.   Current prosthetic wear tolerance (days/week)  daily   Current prosthetic wear tolerance (#hours/day)  increase to 5hrs 2x/day drying half way, initiate morning wear upon arising. PT reports he typically sleeps <5 hrs/night - PT instructed that he should remove prosthesis at least 6hrs/night to allow his skin to recover when he progresses to wear >90% of awake hours.    Residual limb condition  intact, no issues noted.    Education Provided Residual limb care;Proper weight-bearing schedule/adjustment;Proper wear schedule/adjustment;Correct ply sock adjustment;Proper Donning   Person(s) Educated Patient   Education Method Explanation;Demonstration;Tactile cues;Verbal cues   Education Method Verbalized understanding;Returned demonstration;Tactile cues required;Verbal cues required;Needs further instruction   Donning Prosthesis Supervision   Doffing Prosthesis Supervision                  PT Short Term Goals - 05/23/15 1400    PT SHORT TERM GOAL #1   Title Patient verbalizes understanding of prosthetic care with minimal cues for advanced issues like adjusting ply socks. (Target Date: 06/21/2015)   Time 4   Period Weeks   Status New   PT SHORT TERM GOAL #2   Title Patient tolerates wear >12hrs total per day without skin issues or discomfort.  (Target Date: 06/21/2015)   Time 4   Period Weeks   Status New   PT SHORT TERM GOAL  #3   Title Berg Balance >45/56  (Target Date: 06/21/2015)   Time 4   Period Weeks   Status New   PT SHORT TERM GOAL #4   Title Patient ambulates 400' with single point cane & prosthesis with only cues for deviations with no balance losses.  (Target Date: 06/21/2015)   Time 4   Period Weeks   Status New   PT SHORT TERM GOAL #5   Title Patient negoitates ramps, curbs & stairs with single point cane & prosthesis with cues only no physical assist.  (Target Date: 06/21/2015)   Time 4   Period Weeks   Status New           PT Long Term Goals - 05/23/15 1400    PT LONG TERM GOAL #1   Title Patient demonstrates & verbalizes all aspects of prosthetic care correctly to enable safe use of prosthesis.  (Target Date: 07/19/2015)   Time 8   Period Weeks   Status New   PT LONG TERM GOAL #2   Title  Patient toelrates wear of prosthesis >90% of awake hours without skin issues or discomfort to enable functional use throughout his day. (Target Date: 07/19/2015)   Time 8   Period Weeks   Status New   PT LONG TERM GOAL #3   Title Functional Gait Assessment >24/30 with prosthesis (Target Date: 07/19/2015)   Time 8   Period Weeks   Status New   PT LONG TERM GOAL #4   Title Patient ambulates >1000' including grass, ramps, curbs & stairs with single point cane or less and prosthesis modified independent. (Target Date: 07/19/2015)   Time 8   Period Weeks   Status New   PT LONG TERM GOAL #5   Title Patient lifts & carries full basket of laundry around furniture with only prosthesis modified independent. (Target Date: 07/19/2015)   Time 8   Period Weeks   Status New               Plan - 06/10/15 1100    Clinical Impression Statement Patient improved gait with instruction in proper step width and ply sock adjustment. Patient is tolerating increased wear without issues.   Pt will benefit from skilled therapeutic intervention in order to improve on the following deficits Abnormal  gait;Decreased activity tolerance;Decreased balance;Decreased endurance;Decreased mobility;Postural dysfunction;Prosthetic Dependency   Rehab Potential Good   PT Frequency 2x / week   PT Duration 8 weeks   PT Treatment/Interventions ADLs/Self Care Home Management;DME Instruction;Gait training;Stair training;Functional mobility training;Therapeutic activities;Therapeutic exercise;Balance training;Neuromuscular re-education;Patient/family education;Prosthetic Training   PT Next Visit Plan review prosthetic care, gait with cane on barriers; high level balance issues    Consulted and Agree with Plan of Care Patient        Problem List Patient Active Problem List   Diagnosis Date Noted  . Status post below knee amputation of left lower extremity 02/22/2015  . S/P BKA (below knee amputation) unilateral 02/20/2015  . Ischemic ulcer of toe   . Peripheral vascular disease   . PVD (peripheral vascular disease) 01/17/2015  . Cellulitis and abscess of foot 01/13/2015  . Cellulitis and abscess of foot excluding toe 01/13/2015  . Diabetes mellitus without complication 01/13/2015  . Leukocytosis 01/13/2015  . Diabetes 12/19/2007  . DYSLIPIDEMIA 12/19/2007  . ANEMIA 12/19/2007  . ANXIETY 12/19/2007  . Essential hypertension 12/19/2007  . TRANSIENT ISCHEMIC ATTACK 12/19/2007  . PERIPHERAL VASCULAR DISEASE 12/19/2007  . RENAL FAILURE 12/19/2007    Vladimir Faster PT, DPT 06/10/2015, 6:22 PM  [facility] [address] [place], [place], [address] Phone: [phone]   Fax:  [phone]

## 2015-06-12 ENCOUNTER — Encounter: Payer: Self-pay | Admitting: Physical Therapy

## 2015-06-12 ENCOUNTER — Ambulatory Visit: Payer: Medicare Other | Admitting: Physical Therapy

## 2015-06-12 DIAGNOSIS — R6889 Other general symptoms and signs: Secondary | ICD-10-CM | POA: Diagnosis not present

## 2015-06-12 DIAGNOSIS — R269 Unspecified abnormalities of gait and mobility: Secondary | ICD-10-CM

## 2015-06-12 DIAGNOSIS — R2681 Unsteadiness on feet: Secondary | ICD-10-CM

## 2015-06-12 DIAGNOSIS — R2689 Other abnormalities of gait and mobility: Secondary | ICD-10-CM

## 2015-06-12 DIAGNOSIS — R29818 Other symptoms and signs involving the nervous system: Secondary | ICD-10-CM | POA: Diagnosis not present

## 2015-06-12 DIAGNOSIS — Z4789 Encounter for other orthopedic aftercare: Secondary | ICD-10-CM

## 2015-06-12 DIAGNOSIS — Z89512 Acquired absence of left leg below knee: Secondary | ICD-10-CM

## 2015-06-12 DIAGNOSIS — Z5189 Encounter for other specified aftercare: Secondary | ICD-10-CM | POA: Diagnosis not present

## 2015-06-12 NOTE — Therapy (Signed)
[facility] [address] [place], [place], [address] Phone: [phone]   Fax:  [phone]  Physical Therapy Treatment  Patient Details  Name: [name] MRN: [id number] Date of Birth: [birth year] Referring Provider:  Nadara Mustard, MD  Encounter Date: 06/12/2015      PT End of Session - 06/12/15 1100    Visit Number 4   Number of Visits 17   Date for PT Re-Evaluation 07/22/15   Authorization Type G-Code every 10th visit   PT Start Time 1105   PT Stop Time 1145   PT Time Calculation (min) 40 min   Equipment Utilized During Treatment Gait belt   Activity Tolerance Patient tolerated treatment well   Behavior During Therapy [facility] for tasks assessed/performed      Past Medical History  Diagnosis Date  . Renal disorder   . Diabetes mellitus without complication   . CPAP (continuous positive airway pressure) dependence   . Sleep apnea   . Legally blind in left eye, as defined in [place]   . PTSD (post-traumatic stress disorder)   . PVD (peripheral vascular disease) 01/17/2015  . Hypertension   . Anxiety   . GERD (gastroesophageal reflux disease)     Past Surgical History  Procedure Laterality Date  . Combined kidney-pancreas transplant Left 2009  . Incision and drainage abscess Left 01/13/2015    Procedure: INCISION AND DRAINAGE OF LEFT FOOT FIRST RAY ABCESS;  Surgeon: Kathryne Hitch, MD;  Location: [facility] OR;  Service: Orthopedics;  Laterality: Left;  . Abdominal aortagram N/A 01/21/2015    Procedure: ABDOMINAL Ronny Flurry;  Surgeon: Chuck Hint, MD;  Location: [facility] CATH LAB;  Service: Cardiovascular;  Laterality: N/A;  . Lower extremity angiogram Left 01/21/2015    Procedure: LOWER EXTREMITY ANGIOGRAM;  Surgeon: Chuck Hint, MD;  Location: [facility] CATH LAB;  Service: Cardiovascular;  Laterality: Left;  . Amputation Left 01/23/2015    Procedure: FIRST RAY AMPUTATION/LEFT FOOT;  Surgeon: Nadara Mustard, MD;  Location: [facility] OR;  Service: Orthopedics;  Laterality: Left;  . Av fistula placement    . Colonoscopy    . Amputation Left 02/20/2015    Procedure: AMPUTATION BELOW KNEE;  Surgeon: Nadara Mustard, MD;  Location: [facility] OR;  Service: Orthopedics;  Laterality: Left;    There were no vitals filed for this visit.  Visit Diagnosis:  Abnormality of gait  Unsteadiness  Balance problems  Decreased functional activity tolerance  Encounter for prosthetic gait training  Status post below knee amputation of left lower extremity      Subjective Assessment - 06/12/15 1107    Subjective Wearing prosthesis all awake hours except 2 hrs mid-day. No issues.   Currently in Pain? No/denies                         [facility] Adult PT Treatment/Exercise - 06/12/15 1100    Transfers   Sit to Stand 5: Supervision;Without upper extremity assist;From chair/3-in-1   Sit to Stand Details Visual cues/gestures for sequencing;Verbal cues for technique   Stand to Sit 5: Supervision;Without upper extremity assist;To chair/3-in-1   Stand to Sit Details (indicate cue type and reason) Visual cues/gestures for sequencing;Verbal cues for precautions/safety   Ambulation/Gait   Ambulation/Gait Yes   Ambulation/Gait Assistance 5: Supervision   Ambulation/Gait Assistance Details verbal cues on posture, technique on grass, transitions of different hts,    Ambulation Distance (Feet) 800 Feet   Assistive device Prosthesis;Straight  cane   Gait Pattern Step-through pattern;Decreased step length - right;Decreased stance time - left;Decreased stride length;Decreased hip/knee flexion - left;Decreased weight shift to left;Antalgic;Trunk flexed;Abducted - left   Ambulation Surface Indoor;Level;Outdoor;Paved;Gravel;Grass   Stairs Yes   Stairs Assistance 5: Supervision   Stairs Assistance Details (indicate cue type and reason) demo & verbal cues on technique with cane & 1 rail   Stair Management Technique Two  rails;Forwards;Alternating pattern;One rail Left;With cane  progressed reciprocal from 2 rails to cane & 1 rail   Number of Stairs 4  X 10 reps   Ramp 5: Supervision  with cane/prosthesis   Ramp Details (indicate cue type and reason) verbal cues on technique   Curb 5: Supervision  with cane/prosthesis   Curb Details (indicate cue type and reason) verbal cues on technique   Gait Comments --   High Level Balance   High Level Balance Activities Figure 8 turns;Backward walking;Side stepping;Turns;Negotiating over obstacles;Negotitating around obstacles   High Level Balance Comments PT cued with demo technique with prosthesis for stepping over objects, turning, walking sideways & backwards   Self-Care   Self-Care Lifting  lifting & carrying 15# box with contact assist /cues   Lifting PT demo proper technique to lift a box and carry objects that block view of prosthesis   Prosthetics   Prosthetic Care Comments  PT instructed in use of Secret on limb to decrease sweating. Pt ambulated with too many, too few and correct ply socks to learn what proper ply feels like in gait.   Current prosthetic wear tolerance (days/week)  daily   Current prosthetic wear tolerance (#hours/day)  5-6hrs 2x/day drying half way, initiate morning wear upon arising. PT reports he typically sleeps <5 hrs/night - PT instructed that he should remove prosthesis at least 6hrs/night to allow his skin to recover when he progresses to wear >90% of awake hours.    Residual limb condition  intact, no issues noted.    Education Provided Residual limb care;Proper weight-bearing schedule/adjustment;Proper wear schedule/adjustment;Correct ply sock adjustment;Proper Donning  donning with pin to pull tissue forward over tibia   Person(s) Educated Patient   Education Method Explanation;Verbal cues;Demonstration;Tactile cues   Education Method Verbalized understanding;Returned demonstration;Tactile cues required;Verbal cues required;Needs  further instruction   Donning Prosthesis Supervision   Doffing Prosthesis Modified independent (device/increased time)                  PT Short Term Goals - 05/23/15 1400    PT SHORT TERM GOAL #1   Title Patient verbalizes understanding of prosthetic care with minimal cues for advanced issues like adjusting ply socks. (Target Date: 06/21/2015)   Time 4   Period Weeks   Status New   PT SHORT TERM GOAL #2   Title Patient tolerates wear >12hrs total per day without skin issues or discomfort.  (Target Date: 06/21/2015)   Time 4   Period Weeks   Status New   PT SHORT TERM GOAL #3   Title Berg Balance >45/56  (Target Date: 06/21/2015)   Time 4   Period Weeks   Status New   PT SHORT TERM GOAL #4   Title Patient ambulates 400' with single point cane & prosthesis with only cues for deviations with no balance losses.  (Target Date: 06/21/2015)   Time 4   Period Weeks   Status New   PT SHORT TERM GOAL #5   Title Patient negoitates ramps, curbs & stairs with single point cane & prosthesis with cues only  no physical assist.  (Target Date: 06/21/2015)   Time 4   Period Weeks   Status New           PT Long Term Goals - 05/23/15 1400    PT LONG TERM GOAL #1   Title Patient demonstrates & verbalizes all aspects of prosthetic care correctly to enable safe use of prosthesis.  (Target Date: 07/19/2015)   Time 8   Period Weeks   Status New   PT LONG TERM GOAL #2   Title Patient toelrates wear of prosthesis >90% of awake hours without skin issues or discomfort to enable functional use throughout his day. (Target Date: 07/19/2015)   Time 8   Period Weeks   Status New   PT LONG TERM GOAL #3   Title Functional Gait Assessment >24/30 with prosthesis (Target Date: 07/19/2015)   Time 8   Period Weeks   Status New   PT LONG TERM GOAL #4   Title Patient ambulates >1000' including grass, ramps, curbs & stairs with single point cane or less and prosthesis modified independent. (Target  Date: 07/19/2015)   Time 8   Period Weeks   Status New   PT LONG TERM GOAL #5   Title Patient lifts & carries full basket of laundry around furniture with only prosthesis modified independent. (Target Date: 07/19/2015)   Time 8   Period Weeks   Status New               Plan - 06/12/15 1100    Clinical Impression Statement Patient appears to understand basics of advanced skills like lifting, carrying, negotiating over and around obstacles with a prosthesis but needs additional instruction. Patient is on target to meet STGs next week.   Pt will benefit from skilled therapeutic intervention in order to improve on the following deficits Abnormal gait;Decreased activity tolerance;Decreased balance;Decreased endurance;Decreased mobility;Postural dysfunction;Prosthetic Dependency   Rehab Potential Good   PT Frequency 2x / week   PT Duration 8 weeks   PT Treatment/Interventions ADLs/Self Care Home Management;DME Instruction;Gait training;Stair training;Functional mobility training;Therapeutic activities;Therapeutic exercise;Balance training;Neuromuscular re-education;Patient/family education;Prosthetic Training   PT Next Visit Plan Assess STGs, review prosthetic care, gait with cane on barriers; high level balance issues    Consulted and Agree with Plan of Care Patient        Problem List Patient Active Problem List   Diagnosis Date Noted  . Status post below knee amputation of left lower extremity 02/22/2015  . S/P BKA (below knee amputation) unilateral 02/20/2015  . Ischemic ulcer of toe   . Peripheral vascular disease   . PVD (peripheral vascular disease) 01/17/2015  . Cellulitis and abscess of foot 01/13/2015  . Cellulitis and abscess of foot excluding toe 01/13/2015  . Diabetes mellitus without complication 01/13/2015  . Leukocytosis 01/13/2015  . Diabetes 12/19/2007  . DYSLIPIDEMIA 12/19/2007  . ANEMIA 12/19/2007  . ANXIETY 12/19/2007  . Essential hypertension  12/19/2007  . TRANSIENT ISCHEMIC ATTACK 12/19/2007  . PERIPHERAL VASCULAR DISEASE 12/19/2007  . RENAL FAILURE 12/19/2007    Vladimir Faster PT, DPT 06/12/2015, 3:22 PM  [facility] [address] [place], [place], [address] Phone: [phone]   Fax:  [phone]

## 2015-06-17 ENCOUNTER — Ambulatory Visit: Payer: Medicare Other | Admitting: Physical Therapy

## 2015-06-17 ENCOUNTER — Encounter: Payer: Self-pay | Admitting: Physical Therapy

## 2015-06-17 DIAGNOSIS — Z5189 Encounter for other specified aftercare: Secondary | ICD-10-CM | POA: Diagnosis not present

## 2015-06-17 DIAGNOSIS — R6889 Other general symptoms and signs: Secondary | ICD-10-CM

## 2015-06-17 DIAGNOSIS — R269 Unspecified abnormalities of gait and mobility: Secondary | ICD-10-CM | POA: Diagnosis not present

## 2015-06-17 DIAGNOSIS — R29818 Other symptoms and signs involving the nervous system: Secondary | ICD-10-CM | POA: Diagnosis not present

## 2015-06-17 DIAGNOSIS — R2681 Unsteadiness on feet: Secondary | ICD-10-CM

## 2015-06-17 DIAGNOSIS — Z89512 Acquired absence of left leg below knee: Secondary | ICD-10-CM | POA: Diagnosis not present

## 2015-06-17 DIAGNOSIS — R2689 Other abnormalities of gait and mobility: Secondary | ICD-10-CM

## 2015-06-17 NOTE — Therapy (Signed)
[facility] [address] [place], [place], [address] Phone: [phone]   Fax:  [phone]  Physical Therapy Treatment  Patient Details  Name: [name] MRN: [id number] Date of Birth: [birth year] Referring Jakhi Dishman:  Newt Minion, MD  Encounter Date: 06/17/2015      PT End of Session - 06/17/15 1112    Visit Number 5   Number of Visits 17   Date for PT Re-Evaluation 07/22/15   Authorization Type G-Code every 10th visit   PT Start Time 1107   PT Stop Time 1149   PT Time Calculation (min) 42 min   Equipment Utilized During Treatment Gait belt   Activity Tolerance Patient tolerated treatment well   Behavior During Therapy [facility] for tasks assessed/performed      Past Medical History  Diagnosis Date  . Renal disorder   . Diabetes mellitus without complication   . CPAP (continuous positive airway pressure) dependence   . Sleep apnea   . Legally blind in left eye, as defined in [place]   . PTSD (post-traumatic stress disorder)   . PVD (peripheral vascular disease) 01/17/2015  . Hypertension   . Anxiety   . GERD (gastroesophageal reflux disease)     Past Surgical History  Procedure Laterality Date  . Combined kidney-pancreas transplant Left 2009  . Incision and drainage abscess Left 01/13/2015    Procedure: INCISION AND DRAINAGE OF LEFT FOOT FIRST RAY ABCESS;  Surgeon: Mcarthur Rossetti, MD;  Location: [place];  Service: Orthopedics;  Laterality: Left;  . Abdominal aortagram N/A 01/21/2015    Procedure: ABDOMINAL Maxcine Ham;  Surgeon: Angelia Mould, MD;  Location: [facility] CATH LAB;  Service: Cardiovascular;  Laterality: N/A;  . Lower extremity angiogram Left 01/21/2015    Procedure: LOWER EXTREMITY ANGIOGRAM;  Surgeon: Angelia Mould, MD;  Location: [facility] CATH LAB;  Service: Cardiovascular;  Laterality: Left;  . Amputation Left 01/23/2015    Procedure: FIRST RAY AMPUTATION/LEFT FOOT;  Surgeon: Newt Minion, MD;  Location: [place];  Service: Orthopedics;  Laterality: Left;  . Av fistula placement    . Colonoscopy    . Amputation Left 02/20/2015    Procedure: AMPUTATION BELOW KNEE;  Surgeon: Newt Minion, MD;  Location: [place];  Service: Orthopedics;  Laterality: Left;    There were no vitals filed for this visit.  Visit Diagnosis:  Abnormality of gait  Unsteadiness  Balance problems  Decreased functional activity tolerance      Subjective Assessment - 06/17/15 1112    Subjective No new complaints. No falls or pain to day.   Currently in Pain? No/denies          [facility] Adult PT Treatment/Exercise - 06/17/15 1113    Transfers   Sit to Stand 5: Supervision;Without upper extremity assist;From chair/3-in-1;With upper extremity assist   Sit to Stand Details Verbal cues for safe use of DME/AE   Stand to Sit 5: Supervision;Without upper extremity assist;To chair/3-in-1;With upper extremity assist   Stand to Sit Details (indicate cue type and reason) Verbal cues for safe use of DME/AE   Ambulation/Gait   Ambulation/Gait Yes   Ambulation/Gait Assistance 5: Supervision   Ambulation/Gait Assistance Details cues on posture and to correct gait deviations.   Ambulation Distance (Feet) 650 Feet   Assistive device Prosthesis;Straight cane   Gait Pattern Step-through pattern;Decreased step length - right;Decreased stance time - left;Decreased stride length;Decreased hip/knee flexion - left;Decreased weight shift to left;Antalgic;Trunk flexed;Abducted - left   Ambulation  Surface Level;Indoor   Stairs Yes   Stairs Assistance 5: Supervision   Stairs Assistance Details (indicate cue type and reason) occasional cues on prosthetic foot placement with decending stairs reciprocally   Stair Management Technique One rail Right;Forwards;With cane;Alternating pattern   Number of Stairs 4  x 3 reps   Ramp 5: Supervision  with cane/prosthesis x 3 reps   Curb 5: Supervision  with cane/prosthesis x 1 rep    Berg Balance Test   Sit to Stand Able to stand without using hands and stabilize independently   Standing Unsupported Able to stand safely 2 minutes   Sitting with Back Unsupported but Feet Supported on Floor or Stool Able to sit safely and securely 2 minutes   Stand to Sit Sits safely with minimal use of hands   Transfers Able to transfer safely, minor use of hands   Standing Unsupported with Eyes Closed Able to stand 10 seconds safely   Standing Ubsupported with Feet Together Able to place feet together independently and stand for 1 minute with supervision   From Standing, Reach Forward with Outstretched Arm Can reach forward >12 cm safely (5")  8 inches   From Standing Position, Pick up Object from Floor Able to pick up shoe safely and easily   From Standing Position, Turn to Look Behind Over each Shoulder Looks behind one side only/other side shows less weight shift  right > left   Turn 360 Degrees Able to turn 360 degrees safely but slowly  > 5 seconds each way   Standing Unsupported, Alternately Place Feet on Step/Stool Able to complete 4 steps without aid or supervision   Standing Unsupported, One Foot in Front Able to plae foot ahead of the other independently and hold 30 seconds   Standing on One Leg Tries to lift leg/unable to hold 3 seconds but remains standing independently   Total Score 45   Prosthetics   Prosthetic Care Comments  pt using Secret as instructed in previous    Current prosthetic wear tolerance (days/week)  daily   Current prosthetic wear tolerance (#hours/day)  6 hours 2 x day, drying as needed   Residual limb condition  intact, no issues noted.    Education Provided Residual limb care;Correct ply sock adjustment;Proper weight-bearing schedule/adjustment;Proper wear schedule/adjustment   Person(s) Educated Patient   Education Method Explanation   Education Method Verbalized understanding   Donning Prosthesis Supervision   Doffing Prosthesis Modified  independent (device/increased time)            PT Short Term Goals - 06/17/15 1115    PT SHORT TERM GOAL #1   Title Patient verbalizes understanding of prosthetic care with minimal cues for advanced issues like adjusting ply socks. (Target Date: 06/21/2015)   Status Achieved   PT SHORT TERM GOAL #2   Title Patient tolerates wear >12hrs total per day without skin issues or discomfort.  (Target Date: 06/21/2015)   Status Achieved   PT SHORT TERM GOAL #3   Title Berg Balance >45/56  (Target Date: 06/21/2015)   Baseline 06/14/15: 45/53 today   Status Achieved   PT SHORT TERM GOAL #4   Title Patient ambulates 400' with single point cane & prosthesis with only cues for deviations with no balance losses.  (Target Date: 06/21/2015)   Baseline 06/17/15: > 400 feet with gait as stated above   Status Achieved   PT SHORT TERM GOAL #5   Title Patient negoitates ramps, curbs & stairs with single point cane &   prosthesis with cues only no physical assist.  (Target Date: 06/21/2015)   Baseline on 06/17/15   Status Achieved           PT Long Term Goals - 05/23/15 1400    PT LONG TERM GOAL #1   Title Patient demonstrates & verbalizes all aspects of prosthetic care correctly to enable safe use of prosthesis.  (Target Date: 07/19/2015)   Time 8   Period Weeks   Status New   PT LONG TERM GOAL #2   Title Patient toelrates wear of prosthesis >90% of awake hours without skin issues or discomfort to enable functional use throughout his day. (Target Date: 07/19/2015)   Time 8   Period Weeks   Status New   PT LONG TERM GOAL #3   Title Functional Gait Assessment >24/30 with prosthesis (Target Date: 07/19/2015)   Time 8   Period Weeks   Status New   PT LONG TERM GOAL #4   Title Patient ambulates >1000' including grass, ramps, curbs & stairs with single point cane or less and prosthesis modified independent. (Target Date: 07/19/2015)   Time 8   Period Weeks   Status New   PT LONG TERM GOAL #5   Title  Patient lifts & carries full basket of laundry around furniture with only prosthesis modified independent. (Target Date: 07/19/2015)   Time 8   Period Weeks   Status New           Plan - 06/17/15 1112    Clinical Impression Statement All STG's met and pt making steady progress toward LTG's.   Pt will benefit from skilled therapeutic intervention in order to improve on the following deficits Abnormal gait;Decreased activity tolerance;Decreased balance;Decreased endurance;Decreased mobility;Postural dysfunction;Prosthetic Dependency   Rehab Potential Good   PT Frequency 2x / week   PT Duration 8 weeks   PT Treatment/Interventions ADLs/Self Care Home Management;DME Instruction;Gait training;Stair training;Functional mobility training;Therapeutic activities;Therapeutic exercise;Balance training;Neuromuscular re-education;Patient/family education;Prosthetic Training   PT Next Visit Plan  gait with cane on barriers; high level balance issues    Consulted and Agree with Plan of Care Patient        Problem List Patient Active Problem List   Diagnosis Date Noted  . Status post below knee amputation of left lower extremity 02/22/2015  . S/P BKA (below knee amputation) unilateral 02/20/2015  . Ischemic ulcer of toe   . Peripheral vascular disease   . PVD (peripheral vascular disease) 01/17/2015  . Cellulitis and abscess of foot 01/13/2015  . Cellulitis and abscess of foot excluding toe 01/13/2015  . Diabetes mellitus without complication [id number]/[id number]  . Leukocytosis 01/13/2015  . Diabetes 12/19/2007  . DYSLIPIDEMIA 12/19/2007  . ANEMIA 12/19/2007  . ANXIETY 12/19/2007  . Essential hypertension 12/19/2007  . TRANSIENT ISCHEMIC ATTACK 12/19/2007  . PERIPHERAL VASCULAR DISEASE 12/19/2007  . RENAL FAILURE 12/19/2007    Willow Ora 06/17/2015, 3:04 PM  Willow Ora, PTA, [facility] [address], [place], [place] [address] [phone] 06/17/2015, 3:04  PM

## 2015-06-19 ENCOUNTER — Ambulatory Visit: Payer: Medicare Other | Admitting: Physical Therapy

## 2015-06-19 ENCOUNTER — Encounter: Payer: Self-pay | Admitting: Physical Therapy

## 2015-06-19 DIAGNOSIS — R269 Unspecified abnormalities of gait and mobility: Secondary | ICD-10-CM | POA: Diagnosis not present

## 2015-06-19 DIAGNOSIS — R2681 Unsteadiness on feet: Secondary | ICD-10-CM

## 2015-06-19 DIAGNOSIS — R29818 Other symptoms and signs involving the nervous system: Secondary | ICD-10-CM | POA: Diagnosis not present

## 2015-06-19 DIAGNOSIS — Z89512 Acquired absence of left leg below knee: Secondary | ICD-10-CM | POA: Diagnosis not present

## 2015-06-19 DIAGNOSIS — Z4789 Encounter for other orthopedic aftercare: Secondary | ICD-10-CM

## 2015-06-19 DIAGNOSIS — R6889 Other general symptoms and signs: Secondary | ICD-10-CM

## 2015-06-19 DIAGNOSIS — Z5189 Encounter for other specified aftercare: Secondary | ICD-10-CM | POA: Diagnosis not present

## 2015-06-19 DIAGNOSIS — R2689 Other abnormalities of gait and mobility: Secondary | ICD-10-CM

## 2015-06-19 NOTE — Therapy (Signed)
[facility] [address] [place], [place], [address] Phone: [phone]   Fax:  [phone]  Physical Therapy Treatment  Patient Details  Name: [name] MRN: [id number] Date of Birth: 1951/05/02 Referring Provider:  Nadara Mustard, MD  Encounter Date: 06/19/2015      PT End of Session - 06/19/15 1145    Visit Number 6   Number of Visits 17   Date for PT Re-Evaluation 07/22/15   Authorization Type G-Code every 10th visit   PT Start Time 1149   PT Stop Time 1235   PT Time Calculation (min) 46 min   Equipment Utilized During Treatment Gait belt   Activity Tolerance Patient tolerated treatment well   Behavior During Therapy [facility] for tasks assessed/performed      Past Medical History  Diagnosis Date  . Renal disorder   . Diabetes mellitus without complication   . CPAP (continuous positive airway pressure) dependence   . Sleep apnea   . Legally blind in left eye, as defined in [place]   . PTSD (post-traumatic stress disorder)   . PVD (peripheral vascular disease) 01/17/2015  . Hypertension   . Anxiety   . GERD (gastroesophageal reflux disease)     Past Surgical History  Procedure Laterality Date  . Combined kidney-pancreas transplant Left 2009  . Incision and drainage abscess Left 01/13/2015    Procedure: INCISION AND DRAINAGE OF LEFT FOOT FIRST RAY ABCESS;  Surgeon: Kathryne Hitch, MD;  Location: [facility] OR;  Service: Orthopedics;  Laterality: Left;  . Abdominal aortagram N/A 01/21/2015    Procedure: ABDOMINAL Ronny Flurry;  Surgeon: Chuck Hint, MD;  Location: [facility] CATH LAB;  Service: Cardiovascular;  Laterality: N/A;  . Lower extremity angiogram Left 01/21/2015    Procedure: LOWER EXTREMITY ANGIOGRAM;  Surgeon: Chuck Hint, MD;  Location: [facility] CATH LAB;  Service: Cardiovascular;  Laterality: Left;  . Amputation Left 01/23/2015    Procedure: FIRST RAY AMPUTATION/LEFT FOOT;  Surgeon: Nadara Mustard, MD;  Location: [facility] OR;  Service: Orthopedics;  Laterality: Left;  . Av fistula placement    . Colonoscopy    . Amputation Left 02/20/2015    Procedure: AMPUTATION BELOW KNEE;  Surgeon: Nadara Mustard, MD;  Location: [facility] OR;  Service: Orthopedics;  Laterality: Left;    There were no vitals filed for this visit.  Visit Diagnosis:  Abnormality of gait  Unsteadiness  Balance problems  Decreased functional activity tolerance  Encounter for prosthetic gait training  Status post below knee amputation of left lower extremity      Subjective Assessment - 06/19/15 1153    Subjective He is removing prosthesis 1-2 hrs ~3 times during the day due to feels "heavy"   Currently in Pain? No/denies                         [facility] Adult PT Treatment/Exercise - 06/19/15 1145    Transfers   Sit to Stand 5: Supervision;Without upper extremity assist;From chair/3-in-1   Sit to Stand Details Verbal cues for safe use of DME/AE   Stand to Sit 5: Supervision;To chair/3-in-1;Without upper extremity assist   Stand to Sit Details (indicate cue type and reason) Verbal cues for safe use of DME/AE   Ambulation/Gait   Ambulation/Gait Yes   Ambulation/Gait Assistance 5: Supervision;4: Min assist  MinA on grass slope, all other gait SBA   Ambulation/Gait Assistance Details cues on increasing speed with longer steps & quicker  steps,    Ambulation Distance (Feet) 1000 Feet  100' on grass no incline & 100' on grass slope   Assistive device Prosthesis;Straight cane  no device on all surfaces except grass / gravel   Gait Pattern Step-through pattern;Decreased step length - right;Decreased stance time - left;Decreased stride length;Decreased hip/knee flexion - left;Decreased weight shift to left;Antalgic;Trunk flexed;Abducted - left   Ambulation Surface Indoor;Level;Outdoor;Unlevel;Paved;Gravel;Grass   Stairs Yes   Stairs Assistance 5: Supervision   Stairs Assistance Details (indicate cue type and  reason) cues on reciprocal technique with rail, step-to without rails   Stair Management Technique One rail Right;Forwards;Alternating pattern;Two rails;One rail Left;Step to pattern;No rails  reciprocal with 2 rails, progressed 1 rail; no rails step-to   Number of Stairs 4  x 10 reps   Ramp 4: Min assist  with prosthesis only   Ramp Details (indicate cue type and reason) cues on wt shift & posture   Curb 4: Min assist;5: Supervision  with prosthesis only, SBA with cane   Curb Details (indicate cue type and reason) cues on technique   Berg Balance Test   Sit to Stand Able to stand without using hands and stabilize independently   Standing Unsupported Able to stand safely 2 minutes   Sitting with Back Unsupported but Feet Supported on Floor or Stool Able to sit safely and securely 2 minutes   Stand to Sit Sits safely with minimal use of hands   Transfers Able to transfer safely, minor use of hands   Standing Unsupported with Eyes Closed Able to stand 10 seconds safely   Standing Ubsupported with Feet Together Able to place feet together independently and stand for 1 minute with supervision   From Standing, Reach Forward with Outstretched Arm Can reach forward >12 cm safely (5")  8 inches   From Standing Position, Pick up Object from Floor Able to pick up shoe safely and easily   From Standing Position, Turn to Look Behind Over each Shoulder Looks behind one side only/other side shows less weight shift  right > left   Turn 360 Degrees Able to turn 360 degrees safely but slowly  > 5 seconds each way   Standing Unsupported, Alternately Place Feet on Step/Stool Able to complete 4 steps without aid or supervision   Standing Unsupported, One Foot in Front Able to plae foot ahead of the other independently and hold 30 seconds   Standing on One Leg Tries to lift leg/unable to hold 3 seconds but remains standing independently   Total Score 45   High Level Balance   High Level Balance Activities  Side stepping;Direction changes;Backward walking;Turns;Weight-shifting turns  Shag dancing initially in parallel bars then near counter   High Level Balance Comments PT cued technique & set-up to dance at home with benefits to balance if safe environment created   Prosthetics   Prosthetic Care Comments  pt using Secret as instructed   Current prosthetic wear tolerance (days/week)  daily   Current prosthetic wear tolerance (#hours/day)  all awake hours except 1-2 hrs off 2-3 times per day; PT instructed to increase   Residual limb condition  intact, no issues noted.    Education Provided Residual limb care;Correct ply sock adjustment;Proper weight-bearing schedule/adjustment;Proper wear schedule/adjustment   Person(s) Educated Patient   Education Method Explanation;Verbal cues   Education Method Verbalized understanding;Verbal cues required;Needs further instruction   Donning Prosthesis Supervision   Doffing Prosthesis Modified independent (device/increased time)  PT Short Term Goals - 06/17/15 1115    PT SHORT TERM GOAL #1   Title Patient verbalizes understanding of prosthetic care with minimal cues for advanced issues like adjusting ply socks. (Target Date: 06/21/2015)   Status Achieved   PT SHORT TERM GOAL #2   Title Patient tolerates wear >12hrs total per day without skin issues or discomfort.  (Target Date: 06/21/2015)   Status Achieved   PT SHORT TERM GOAL #3   Title Berg Balance >45/56  (Target Date: 06/21/2015)   Baseline 06/14/15: 45/53 today   Status Achieved   PT SHORT TERM GOAL #4   Title Patient ambulates 400' with single point cane & prosthesis with only cues for deviations with no balance losses.  (Target Date: 06/21/2015)   Baseline 06/17/15: > 400 feet with gait as stated above   Status Achieved   PT SHORT TERM GOAL #5   Title Patient negoitates ramps, curbs & stairs with single point cane & prosthesis with cues only no physical assist.  (Target  Date: 06/21/2015)   Baseline on 06/17/15   Status Achieved           PT Long Term Goals - 05/23/15 1400    PT LONG TERM GOAL #1   Title Patient demonstrates & verbalizes all aspects of prosthetic care correctly to enable safe use of prosthesis.  (Target Date: 07/19/2015)   Time 8   Period Weeks   Status New   PT LONG TERM GOAL #2   Title Patient toelrates wear of prosthesis >90% of awake hours without skin issues or discomfort to enable functional use throughout his day. (Target Date: 07/19/2015)   Time 8   Period Weeks   Status New   PT LONG TERM GOAL #3   Title Functional Gait Assessment >24/30 with prosthesis (Target Date: 07/19/2015)   Time 8   Period Weeks   Status New   PT LONG TERM GOAL #4   Title Patient ambulates >1000' including grass, ramps, curbs & stairs with single point cane or less and prosthesis modified independent. (Target Date: 07/19/2015)   Time 8   Period Weeks   Status New   PT LONG TERM GOAL #5   Title Patient lifts & carries full basket of laundry around furniture with only prosthesis modified independent. (Target Date: 07/19/2015)   Time 8   Period Weeks   Status New               Plan - 06/19/15 1145    Clinical Impression Statement Patient was able to work on his balance using activity that he previously enjoyed of [employer]. He needs to be near surfaces to touch for stablizing balance if needed and start with small easy movements. Patient improved knee control on stairs reciprocal pattern with instruction and repetition.   Pt will benefit from skilled therapeutic intervention in order to improve on the following deficits Abnormal gait;Decreased activity tolerance;Decreased balance;Decreased endurance;Decreased mobility;Postural dysfunction;Prosthetic Dependency   Rehab Potential Good   PT Frequency 2x / week   PT Duration 8 weeks   PT Treatment/Interventions ADLs/Self Care Home Management;DME Instruction;Gait training;Stair  training;Functional mobility training;Therapeutic activities;Therapeutic exercise;Balance training;Neuromuscular re-education;Patient/family education;Prosthetic Training   PT Next Visit Plan  gait with cane on barriers; high level balance issues    Consulted and Agree with Plan of Care Patient        Problem List Patient Active Problem List   Diagnosis Date Noted  . Status post below knee amputation of left lower  extremity 02/22/2015  . S/P BKA (below knee amputation) unilateral 02/20/2015  . Ischemic ulcer of toe   . Peripheral vascular disease   . PVD (peripheral vascular disease) 01/17/2015  . Cellulitis and abscess of foot 01/13/2015  . Cellulitis and abscess of foot excluding toe 01/13/2015  . Diabetes mellitus without complication 01/13/2015  . Leukocytosis 01/13/2015  . Diabetes 12/19/2007  . DYSLIPIDEMIA 12/19/2007  . ANEMIA 12/19/2007  . ANXIETY 12/19/2007  . Essential hypertension 12/19/2007  . TRANSIENT ISCHEMIC ATTACK 12/19/2007  . PERIPHERAL VASCULAR DISEASE 12/19/2007  . RENAL FAILURE 12/19/2007    Vladimir Faster PT, DPT 06/19/2015, 1:08 PM  [facility] [address] [place], [place], [address] Phone: [phone]   Fax:  [phone]

## 2015-06-24 ENCOUNTER — Ambulatory Visit: Payer: Medicare Other | Admitting: Physical Therapy

## 2015-06-26 ENCOUNTER — Encounter: Payer: Self-pay | Admitting: Physical Therapy

## 2015-06-26 ENCOUNTER — Ambulatory Visit: Payer: Medicare Other | Admitting: Physical Therapy

## 2015-06-26 DIAGNOSIS — R6889 Other general symptoms and signs: Secondary | ICD-10-CM | POA: Diagnosis not present

## 2015-06-26 DIAGNOSIS — R29818 Other symptoms and signs involving the nervous system: Secondary | ICD-10-CM | POA: Diagnosis not present

## 2015-06-26 DIAGNOSIS — R2689 Other abnormalities of gait and mobility: Secondary | ICD-10-CM

## 2015-06-26 DIAGNOSIS — Z4789 Encounter for other orthopedic aftercare: Secondary | ICD-10-CM

## 2015-06-26 DIAGNOSIS — Z89512 Acquired absence of left leg below knee: Secondary | ICD-10-CM | POA: Diagnosis not present

## 2015-06-26 DIAGNOSIS — Z5189 Encounter for other specified aftercare: Secondary | ICD-10-CM | POA: Diagnosis not present

## 2015-06-26 DIAGNOSIS — R269 Unspecified abnormalities of gait and mobility: Secondary | ICD-10-CM | POA: Diagnosis not present

## 2015-06-26 DIAGNOSIS — R2681 Unsteadiness on feet: Secondary | ICD-10-CM | POA: Diagnosis not present

## 2015-06-26 NOTE — Therapy (Signed)
[facility] [address] [place], [place], [address] Phone: [phone]   Fax:  [phone]  Physical Therapy Treatment  Patient Details  Name: [name] MRN: [id number] Date of Birth: 01/21/1951 Referring Provider:  Nadara Mustard, MD  Encounter Date: 06/26/2015      PT End of Session - 06/26/15 1100    Visit Number 7   Number of Visits 17   Date for PT Re-Evaluation 07/22/15   Authorization Type G-Code every 10th visit   PT Start Time 1105   PT Stop Time 1148   PT Time Calculation (min) 43 min   Equipment Utilized During Treatment Gait belt   Activity Tolerance Patient tolerated treatment well   Behavior During Therapy [facility] for tasks assessed/performed      Past Medical History  Diagnosis Date  . Renal disorder   . Diabetes mellitus without complication   . CPAP (continuous positive airway pressure) dependence   . Sleep apnea   . Legally blind in left eye, as defined in [place]   . PTSD (post-traumatic stress disorder)   . PVD (peripheral vascular disease) 01/17/2015  . Hypertension   . Anxiety   . GERD (gastroesophageal reflux disease)     Past Surgical History  Procedure Laterality Date  . Combined kidney-pancreas transplant Left 2009  . Incision and drainage abscess Left 01/13/2015    Procedure: INCISION AND DRAINAGE OF LEFT FOOT FIRST RAY ABCESS;  Surgeon: Kathryne Hitch, MD;  [facility]: [facility] OR;  Service: Orthopedics;  Laterality: Left;  . Abdominal aortagram N/A 01/21/2015    Procedure: ABDOMINAL Ronny Flurry;  Surgeon: Chuck Hint, MD;  Location: [facility] CATH LAB;  Service: Cardiovascular;  Laterality: N/A;  . Lower extremity angiogram Left 01/21/2015    Procedure: LOWER EXTREMITY ANGIOGRAM;  Surgeon: Chuck Hint, MD;  Location: [facility] CATH LAB;  Service: Cardiovascular;  Laterality: Left;  . Amputation Left 01/23/2015    Procedure: FIRST RAY AMPUTATION/LEFT FOOT;  Surgeon: Nadara Mustard, MD;  Location: [facility] OR;  Service: Orthopedics;  Laterality: Left;  . Av fistula placement    . Colonoscopy    . Amputation Left 02/20/2015    Procedure: AMPUTATION BELOW KNEE;  Surgeon: Nadara Mustard, MD;  Location: [facility] OR;  Service: Orthopedics;  Laterality: Left;    There were no vitals filed for this visit.  Visit Diagnosis:  Abnormality of gait  Unsteadiness  Balance problems  Decreased functional activity tolerance  Encounter for prosthetic gait training  Status post below knee amputation of left lower extremity      Subjective Assessment - 06/26/15 1106    Subjective He saw prosthetist this morning who made alignment changes. He is wearing prosthesis most of awake hours with only removing 30 minutes ~2 times.   Currently in Pain? No/denies                         [facility] Adult PT Treatment/Exercise - 06/26/15 1100    Transfers   Sit to Stand 5: Supervision;With upper extremity assist;From chair/3-in-1   Sit to Stand Details Verbal cues for safe use of DME/AE   Stand to Sit 5: Supervision;To chair/3-in-1;Without upper extremity assist   Stand to Sit Details (indicate cue type and reason) Verbal cues for safe use of DME/AE   Ambulation/Gait   Ambulation/Gait Yes   Ambulation/Gait Assistance 5: Supervision   Ambulation Distance (Feet) 1000 Feet  100' on grass   Assistive device Prosthesis;Straight  cane  no device on all surfaces except grass / gravel   Gait Pattern Step-through pattern;Decreased step length - right;Decreased stance time - left;Decreased stride length;Decreased hip/knee flexion - left;Decreased weight shift to left;Antalgic;Trunk flexed;Abducted - left   Ambulation Surface Indoor;Level;Outdoor;Paved;Gravel;Grass   Stairs Yes   Stairs Assistance 5: Supervision   Stairs Assistance Details (indicate cue type and reason) cues on knee control   Stair Management Technique One rail Right;Forwards;Alternating pattern;Two rails;One rail Left;Step  to pattern;No rails  reciprocal with 2 rails, progressed 1 rail; no rails step-to   Number of Stairs 4  x 5 reps   Ramp 4: Min assist;5: Supervision  with prosthesis only, SBA cane   Ramp Details (indicate cue type and reason) cues on technique for knee control   Curb 4: Min assist;5: Supervision  with prosthesis only, SBA with cane   Curb Details (indicate cue type and reason) cues on wt shift & step length    Berg Balance Test   Sit to Stand --   Standing Unsupported --   Sitting with Back Unsupported but Feet Supported on Floor or Stool --   Stand to Sit --   Transfers --   Standing Unsupported with Eyes Closed --   Standing Ubsupported with Feet Together --   From Standing, Reach Forward with Outstretched Arm --   From Standing Position, Pick up Object from Floor --   From Standing Position, Turn to Look Behind Over each Shoulder --   Turn 360 Degrees --   Standing Unsupported, Alternately Place Feet on Step/Stool --   Standing Unsupported, One Foot in Front --   Standing on One Leg --   Total Score --   High Level Balance   High Level Balance Activities Side stepping;Direction changes;Backward walking;Turns;Weight-shifting turns;Head turns;Negotitating around obstacles;Negotiating over obstacles  head turns right/left, up/down & diagonals, increasing speed   High Level Balance Comments cues on using walls in hall for visual reference to maintain path   Therapeutic Activites    Therapeutic Activities Other Therapeutic Activities  climbing A-frame ladder   Other Therapeutic Activities PT demo, instructed prior & verbal, manual cues during on technique with prosthesis   Prosthetics   Prosthetic Care Comments  managing sweat, increasing wear to all awake hours   Current prosthetic wear tolerance (days/week)  daily   Current prosthetic wear tolerance (#hours/day)  increase to all awake hours   Residual limb condition  intact, no issues noted.    Education Provided Residual limb  care;Correct ply sock adjustment;Proper weight-bearing schedule/adjustment;Proper wear schedule/adjustment   Person(s) Educated Patient   Education Method Explanation;Demonstration;Verbal cues   Education Method Verbalized understanding;Verbal cues required;Needs further instruction   Donning Prosthesis Supervision   Doffing Prosthesis Modified independent (device/increased time)                  PT Short Term Goals - 06/17/15 1115    PT SHORT TERM GOAL #1   Title Patient verbalizes understanding of prosthetic care with minimal cues for advanced issues like adjusting ply socks. (Target Date: 06/21/2015)   Status Achieved   PT SHORT TERM GOAL #2   Title Patient tolerates wear >12hrs total per day without skin issues or discomfort.  (Target Date: 06/21/2015)   Status Achieved   PT SHORT TERM GOAL #3   Title Berg Balance >45/56  (Target Date: 06/21/2015)   Baseline 06/14/15: 45/53 today   Status Achieved   PT SHORT TERM GOAL #4   Title Patient ambulates 400' with  single point cane & prosthesis with only cues for deviations with no balance losses.  (Target Date: 06/21/2015)   Baseline 06/17/15: > 400 feet with gait as stated above   Status Achieved   PT SHORT TERM GOAL #5   Title Patient negoitates ramps, curbs & stairs with single point cane & prosthesis with cues only no physical assist.  (Target Date: 06/21/2015)   Baseline on 06/17/15   Status Achieved           PT Long Term Goals - 05/23/15 1400    PT LONG TERM GOAL #1   Title Patient demonstrates & verbalizes all aspects of prosthetic care correctly to enable safe use of prosthesis.  (Target Date: 07/19/2015)   Time 8   Period Weeks   Status New   PT LONG TERM GOAL #2   Title Patient toelrates wear of prosthesis >90% of awake hours without skin issues or discomfort to enable functional use throughout his day. (Target Date: 07/19/2015)   Time 8   Period Weeks   Status New   PT LONG TERM GOAL #3   Title Functional Gait  Assessment >24/30 with prosthesis (Target Date: 07/19/2015)   Time 8   Period Weeks   Status New   PT LONG TERM GOAL #4   Title Patient ambulates >1000' including grass, ramps, curbs & stairs with single point cane or less and prosthesis modified independent. (Target Date: 07/19/2015)   Time 8   Period Weeks   Status New   PT LONG TERM GOAL #5   Title Patient lifts & carries full basket of laundry around furniture with only prosthesis modified independent. (Target Date: 07/19/2015)   Time 8   Period Weeks   Status New               Plan - 06/26/15 1100    Clinical Impression Statement Patient improved advanced skills in gait including head turns to scan, negotiating barriers including around & over objects and lifing items. Patient is on target to meet LTGs.   Pt will benefit from skilled therapeutic intervention in order to improve on the following deficits Abnormal gait;Decreased activity tolerance;Decreased balance;Decreased endurance;Decreased mobility;Postural dysfunction;Prosthetic Dependency   Rehab Potential Good   PT Frequency 2x / week   PT Duration 8 weeks   PT Treatment/Interventions ADLs/Self Care Home Management;DME Instruction;Gait training;Stair training;Functional mobility training;Therapeutic activities;Therapeutic exercise;Balance training;Neuromuscular re-education;Patient/family education;Prosthetic Training   PT Next Visit Plan  gait with cane on barriers; high level balance issues    Consulted and Agree with Plan of Care Patient        Problem List Patient Active Problem List   Diagnosis Date Noted  . Status post below knee amputation of left lower extremity 02/22/2015  . S/P BKA (below knee amputation) unilateral 02/20/2015  . Ischemic ulcer of toe   . Peripheral vascular disease   . PVD (peripheral vascular disease) 01/17/2015  . Cellulitis and abscess of foot 01/13/2015  . Cellulitis and abscess of foot excluding toe 01/13/2015  . Diabetes  mellitus without complication 01/13/2015  . Leukocytosis 01/13/2015  . Diabetes 12/19/2007  . DYSLIPIDEMIA 12/19/2007  . ANEMIA 12/19/2007  . ANXIETY 12/19/2007  . Essential hypertension 12/19/2007  . TRANSIENT ISCHEMIC ATTACK 12/19/2007  . PERIPHERAL VASCULAR DISEASE 12/19/2007  . RENAL FAILURE 12/19/2007    Vladimir Faster PT, DPT 06/26/2015, 9:34 PM  [facility] [address] [place], [place], [address] Phone: [phone]   Fax:  [phone]

## 2015-07-01 ENCOUNTER — Ambulatory Visit: Payer: Medicare Other | Attending: Orthopedic Surgery | Admitting: Rehabilitation

## 2015-07-01 ENCOUNTER — Encounter: Payer: Self-pay | Admitting: Rehabilitation

## 2015-07-01 DIAGNOSIS — R269 Unspecified abnormalities of gait and mobility: Secondary | ICD-10-CM | POA: Diagnosis not present

## 2015-07-01 DIAGNOSIS — R6889 Other general symptoms and signs: Secondary | ICD-10-CM | POA: Diagnosis not present

## 2015-07-01 DIAGNOSIS — R29818 Other symptoms and signs involving the nervous system: Secondary | ICD-10-CM | POA: Diagnosis not present

## 2015-07-01 DIAGNOSIS — R2689 Other abnormalities of gait and mobility: Secondary | ICD-10-CM

## 2015-07-01 DIAGNOSIS — R2681 Unsteadiness on feet: Secondary | ICD-10-CM | POA: Insufficient documentation

## 2015-07-01 DIAGNOSIS — Z89512 Acquired absence of left leg below knee: Secondary | ICD-10-CM | POA: Insufficient documentation

## 2015-07-01 DIAGNOSIS — Z5189 Encounter for other specified aftercare: Secondary | ICD-10-CM | POA: Diagnosis not present

## 2015-07-01 NOTE — Therapy (Signed)
[facility] [address] [place], [place], [address] Phone: [phone]   Fax:  [phone]  Physical Therapy Treatment  Patient Details  Name: [name] MRN: [id number] Date of Birth: 21-Mar-1951 Referring Provider:  Nadara Mustard, MD  Encounter Date: 07/01/2015      PT End of Session - 07/01/15 1834    Visit Number 8   Number of Visits 17   Date for PT Re-Evaluation 07/22/15   Authorization Type G-Code every 10th visit   PT Start Time 1316   PT Stop Time 1400   PT Time Calculation (min) 44 min   Equipment Utilized During Treatment Gait belt   Activity Tolerance Patient tolerated treatment well   Behavior During Therapy [facility] for tasks assessed/performed      Past Medical History  Diagnosis Date  . Renal disorder   . Diabetes mellitus without complication (HCC)   . CPAP (continuous positive airway pressure) dependence   . Sleep apnea   . Legally blind in left eye, as defined in [place]   . PTSD (post-traumatic stress disorder)   . PVD (peripheral vascular disease) (HCC) 01/17/2015  . Hypertension   . Anxiety   . GERD (gastroesophageal reflux disease)     Past Surgical History  Procedure Laterality Date  . Combined kidney-pancreas transplant Left 2009  . Incision and drainage abscess Left 01/13/2015    Procedure: INCISION AND DRAINAGE OF LEFT FOOT FIRST RAY ABCESS;  Surgeon: Kathryne Hitch, MD;  Location: [facility] OR;  Service: Orthopedics;  Laterality: Left;  . Abdominal aortagram N/A 01/21/2015    Procedure: ABDOMINAL Ronny Flurry;  Surgeon: Chuck Hint, MD;  Location: [facility] CATH LAB;  Service: Cardiovascular;  Laterality: N/A;  . Lower extremity angiogram Left 01/21/2015    Procedure: LOWER EXTREMITY ANGIOGRAM;  Surgeon: Chuck Hint, MD;  Location: [facility] CATH LAB;  Service: Cardiovascular;  Laterality: Left;  . Amputation Left 01/23/2015    Procedure: FIRST RAY AMPUTATION/LEFT FOOT;  Surgeon:  Nadara Mustard, MD;  Location: [facility] OR;  Service: Orthopedics;  Laterality: Left;  . Av fistula placement    . Colonoscopy    . Amputation Left 02/20/2015    Procedure: AMPUTATION BELOW KNEE;  Surgeon: Nadara Mustard, MD;  Location: [facility] OR;  Service: Orthopedics;  Laterality: Left;    There were no vitals filed for this visit.  Visit Diagnosis:  Abnormality of gait  Unsteadiness  Balance problems      Subjective Assessment - 07/01/15 1321    Subjective "I have to go to the [place] this week because they find a mass on my lung."     Patient Stated Goals To use prosthesis to walk in community, active including exercising   Currently in Pain? No/denies                         [facility] Adult PT Treatment/Exercise - 07/01/15 1322    Transfers   Sit to Stand 5: Supervision;With upper extremity assist;From chair/3-in-1   Sit to Stand Details Verbal cues for safe use of DME/AE   Stand to Sit 5: Supervision;To chair/3-in-1;Without upper extremity assist   Stand to Sit Details (indicate cue type and reason) Verbal cues for safe use of DME/AE   Ambulation/Gait   Ambulation/Gait Yes   Ambulation/Gait Assistance 4: Min guard;4: Min assist   Ambulation Distance (Feet) 70 Feet  x 2 reps, 20' x 3 reps    Assistive device Prosthesis;Straight cane  Gait Pattern Step-through pattern;Decreased step length - right;Decreased stance time - left;Decreased stride length;Decreased hip/knee flexion - left;Decreased weight shift to left;Antalgic;Trunk flexed;Abducted - left   Ambulation Surface Level;Unlevel;Indoor  obstacle course   Stairs --   Stairs Assistance --   Stair Management Technique --   Number of Stairs --   Ramp --   Curb --   Gait Comments Addressed gait during functional activities for balance, see balance section.    High Level Balance   High Level Balance Activities Head turns;Marching forwards   High Level Balance Comments Addressed gait using SPC while stepping over  obstacles/barriers, compliant surfaces to challenge balance, proper prosthetic placement and safe use of SPC.  Requires min A for stepping over mat with weights underneath to ensure safe placement of LLE.  Cues to hit with heel to avoid L knee buckle.  Did very well stepping over orange barriers, and tapping to stepping over cones.  Transitioned to carrying plate of "food" in one hand to better simulate home activity.  Performed without use of SPC at S level.  Min cues for increased L step length and decreased trunk movement.  Progressed to carrying crate with approx 15lbs to further simulate ADL's and challenge balance with bimanual task.  Pt able to ambulate at S level, however when given task of carrying crate up/down stairs requires min/mod A to steady with cues for safe stepping technique.  Ended session with gait with marching during head turns.  Pt unable to fully get hip/knee flex and head turn, therefore transitioned to gait with head turns only, more difficulty noted with horizontal head movement.  Performed x 4 reps of 30' at min/guard to close S level.     Therapeutic Activites    Therapeutic Activities --   Other Therapeutic Activities --   Prosthetics   Prosthetic Care Comments  managing sweat, increasing wear to all awake hours   Current prosthetic wear tolerance (days/week)  daily   Current prosthetic wear tolerance (#hours/day)  increase to all awake hours   Residual limb condition  intact, no issues noted.    Education Provided Residual limb care;Correct ply sock adjustment;Proper weight-bearing schedule/adjustment;Proper wear schedule/adjustment   Person(s) Educated Patient   Education Method Explanation   Education Method Verbalized understanding   Donning Prosthesis Supervision   Doffing Prosthesis Modified independent (device/increased time)                PT Education - 07/01/15 1828    Education provided Yes   Education Details safety at home without use of cane    Person(s) Educated Patient   Methods Explanation   Comprehension Verbalized understanding          PT Short Term Goals - 06/17/15 1115    PT SHORT TERM GOAL #1   Title Patient verbalizes understanding of prosthetic care with minimal cues for advanced issues like adjusting ply socks. (Target Date: 06/21/2015)   Status Achieved   PT SHORT TERM GOAL #2   Title Patient tolerates wear >12hrs total per day without skin issues or discomfort.  (Target Date: 06/21/2015)   Status Achieved   PT SHORT TERM GOAL #3   Title Berg Balance >45/56  (Target Date: 06/21/2015)   Baseline 06/14/15: 45/53 today   Status Achieved   PT SHORT TERM GOAL #4   Title Patient ambulates 400' with single point cane & prosthesis with only cues for deviations with no balance losses.  (Target Date: 06/21/2015)   Baseline 06/17/15: > 400  feet with gait as stated above   Status Achieved   PT SHORT TERM GOAL #5   Title Patient negoitates ramps, curbs & stairs with single point cane & prosthesis with cues only no physical assist.  (Target Date: 06/21/2015)   Baseline on 06/17/15   Status Achieved           PT Long Term Goals - 05/23/15 1400    PT LONG TERM GOAL #1   Title Patient demonstrates & verbalizes all aspects of prosthetic care correctly to enable safe use of prosthesis.  (Target Date: 07/19/2015)   Time 8   Period Weeks   Status New   PT LONG TERM GOAL #2   Title Patient toelrates wear of prosthesis >90% of awake hours without skin issues or discomfort to enable functional use throughout his day. (Target Date: 07/19/2015)   Time 8   Period Weeks   Status New   PT LONG TERM GOAL #3   Title Functional Gait Assessment >24/30 with prosthesis (Target Date: 07/19/2015)   Time 8   Period Weeks   Status New   PT LONG TERM GOAL #4   Title Patient ambulates >1000' including grass, ramps, curbs & stairs with single point cane or less and prosthesis modified independent. (Target Date: 07/19/2015)   Time 8   Period  Weeks   Status New   PT LONG TERM GOAL #5   Title Patient lifts & carries full basket of laundry around furniture with only prosthesis modified independent. (Target Date: 07/19/2015)   Time 8   Period Weeks   Status New               Plan - 07/01/15 1834    Clinical Impression Statement Pt continues to demonstrate improvement in negotiation of obstacles with use of cane.  Note more challenging barriers (mat with weights) requires more min A for LOB due to poor LLE placement.  Continue to work towards LTG's and high level balance tasks.     Pt will benefit from skilled therapeutic intervention in order to improve on the following deficits Abnormal gait;Decreased activity tolerance;Decreased balance;Decreased endurance;Decreased mobility;Postural dysfunction;Prosthetic Dependency   Rehab Potential Good   PT Frequency 2x / week   PT Duration 8 weeks   PT Treatment/Interventions ADLs/Self Care Home Management;DME Instruction;Gait training;Stair training;Functional mobility training;Therapeutic activities;Therapeutic exercise;Balance training;Neuromuscular re-education;Patient/family education;Prosthetic Training   PT Next Visit Plan  gait with cane on barriers; high level balance issues, working on LTG's.    Consulted and Agree with Plan of Care Patient        Problem List Patient Active Problem List   Diagnosis Date Noted  . 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 S/P BKA (below knee amputation) unilateral (HCC) 02/20/2015  . Ischemic ulcer of toe (HCC)   . Peripheral vascular disease (HCC)   . PVD (peripheral vascular disease) (HCC) 01/17/2015  . Cellulitis and abscess of foot 01/13/2015  . Cellulitis and abscess of foot excluding toe 01/13/2015  . Diabetes mellitus without complication (HCC) 01/13/2015  . Leukocytosis 01/13/2015  . Diabetes (HCC) 12/19/2007  . DYSLIPIDEMIA 12/19/2007  . ANEMIA 12/19/2007  . ANXIETY 12/19/2007  .  Essential hypertension 12/19/2007  . TRANSIENT ISCHEMIC ATTACK 12/19/2007  . PERIPHERAL VASCULAR DISEASE 12/19/2007  . RENAL FAILURE 12/19/2007   Harriet Butte, PT, MPT [facility] [address] [place], [place], [address] Phone: [phone]   Fax:  [phone] 07/01/2015, 6:37 PM

## 2015-07-03 ENCOUNTER — Ambulatory Visit: Payer: Medicare Other | Admitting: Physical Therapy

## 2015-07-08 ENCOUNTER — Ambulatory Visit: Payer: Medicare Other | Admitting: Physical Therapy

## 2015-07-10 ENCOUNTER — Ambulatory Visit: Payer: Medicare Other | Admitting: Physical Therapy

## 2015-07-10 ENCOUNTER — Encounter: Payer: Self-pay | Admitting: Physical Therapy

## 2015-07-10 DIAGNOSIS — Z5189 Encounter for other specified aftercare: Secondary | ICD-10-CM | POA: Diagnosis not present

## 2015-07-10 DIAGNOSIS — R269 Unspecified abnormalities of gait and mobility: Secondary | ICD-10-CM

## 2015-07-10 DIAGNOSIS — R29818 Other symptoms and signs involving the nervous system: Secondary | ICD-10-CM | POA: Diagnosis not present

## 2015-07-10 DIAGNOSIS — R6889 Other general symptoms and signs: Secondary | ICD-10-CM

## 2015-07-10 DIAGNOSIS — R2681 Unsteadiness on feet: Secondary | ICD-10-CM

## 2015-07-10 DIAGNOSIS — Z89512 Acquired absence of left leg below knee: Secondary | ICD-10-CM | POA: Diagnosis not present

## 2015-07-10 DIAGNOSIS — R2689 Other abnormalities of gait and mobility: Secondary | ICD-10-CM

## 2015-07-10 DIAGNOSIS — Z4789 Encounter for other orthopedic aftercare: Secondary | ICD-10-CM

## 2015-07-10 NOTE — Therapy (Signed)
[facility] [address] [place], [place], [address] Phone: [phone]   Fax:  [phone]  Physical Therapy Treatment  Patient Details  Name: [name] MRN: [id number] Date of Birth: June 28, 1951 Referring Provider:  Nadara Mustard, MD  Encounter Date: 07/10/2015      PT End of Session - 07/10/15 1100    Visit Number 9   Number of Visits 17   Date for PT Re-Evaluation 07/22/15   Authorization Type G-Code every 10th visit   PT Start Time 1103   PT Stop Time 1145   PT Time Calculation (min) 42 min   Equipment Utilized During Treatment Gait belt   Activity Tolerance Patient tolerated treatment well   Behavior During Therapy [facility] for tasks assessed/performed      Past Medical History  Diagnosis Date  . Renal disorder   . Diabetes mellitus without complication (HCC)   . CPAP (continuous positive airway pressure) dependence   . Sleep apnea   . Legally blind in left eye, as defined in [place]   . PTSD (post-traumatic stress disorder)   . PVD (peripheral vascular disease) (HCC) 01/17/2015  . Hypertension   . Anxiety   . GERD (gastroesophageal reflux disease)     Past Surgical History  Procedure Laterality Date  . Combined kidney-pancreas transplant Left 2009  . Incision and drainage abscess Left 01/13/2015    Procedure: INCISION AND DRAINAGE OF LEFT FOOT FIRST RAY ABCESS;  Surgeon: Kathryne Hitch, MD;  [facility] OR;  Service: Orthopedics;  Laterality: Left;  . Abdominal aortagram N/A 01/21/2015    Procedure: ABDOMINAL Ronny Flurry;  Surgeon: Chuck Hint, MD;  Location: [facility] CATH LAB;  Service: Cardiovascular;  Laterality: N/A;  . Lower extremity angiogram Left 01/21/2015    Procedure: LOWER EXTREMITY ANGIOGRAM;  Surgeon: Chuck Hint, MD;  Location: [facility] CATH LAB;  Service: Cardiovascular;  Laterality: Left;  . Amputation Left 01/23/2015    Procedure: FIRST RAY AMPUTATION/LEFT FOOT;  Surgeon:  Nadara Mustard, MD;  Location: [facility] OR;  Service: Orthopedics;  Laterality: Left;  . Av fistula placement    . Colonoscopy    . Amputation Left 02/20/2015    Procedure: AMPUTATION BELOW KNEE;  Surgeon: Nadara Mustard, MD;  Location: [facility] OR;  Service: Orthopedics;  Laterality: Left;    There were no vitals filed for this visit.  Visit Diagnosis:  Abnormality of gait  Unsteadiness  Balance problems  Decreased functional activity tolerance  Encounter for prosthetic gait training      Subjective Assessment - 07/10/15 1109    Subjective He is wearing prosthesis all awake hours without issues. No falls.   Currently in Pain? No/denies                         [facility] Adult PT Treatment/Exercise - 07/10/15 1100    Transfers   Sit to Stand 5: Supervision;With upper extremity assist;From chair/3-in-1   Sit to Stand Details Verbal cues for safe use of DME/AE   Stand to Sit 5: Supervision;To chair/3-in-1;Without upper extremity assist   Stand to Sit Details (indicate cue type and reason) Verbal cues for safe use of DME/AE   Ambulation/Gait   Ambulation/Gait Yes   Ambulation/Gait Assistance 4: Min guard;5: Supervision   Ambulation/Gait Assistance Details verbal, tactile cues for maintaining path, pace with head turns to scan environment   Ambulation Distance (Feet) 300 Feet  300' X 2   Assistive device Prosthesis  Gait Pattern Step-through pattern;Decreased step length - right;Decreased stance time - left;Decreased stride length;Decreased hip/knee flexion - left;Decreased weight shift to left;Antalgic;Trunk flexed;Abducted - left   Ambulation Surface Indoor;Level   Stairs Yes   Stairs Assistance 5: Supervision   Stairs Assistance Details (indicate cue type and reason) verbal cues on knee control & technique   Stair Management Technique Two rails;One rail Left;One rail Right;Alternating pattern;Forwards   Number of Stairs 4  3 reps   Ramp 4: Min assist  prosthesis only    Ramp Details (indicate cue type and reason) verbal & tactile cues on wt shift & knee control   Curb 4: Min assist  prosthesis only   Curb Details (indicate cue type and reason) cues on technique to maintain momentum and step thru for stability   Gait Comments --   High Level Balance   High Level Balance Activities Head turns;Marching forwards;Side stepping;Backward walking;Direction changes;Turns;Negotitating around obstacles;Negotiating over obstacles   High Level Balance Comments verbal cues on technique with prosthesis for balance reactions & technique   Prosthetics   Prosthetic Care Comments  managing sweat, increasing wear to all awake hours   Current prosthetic wear tolerance (days/week)  daily   Current prosthetic wear tolerance (#hours/day)  all awake hours   Residual limb condition  intact, no issues noted.    Education Provided Residual limb care;Proper weight-bearing schedule/adjustment;Proper wear schedule/adjustment   Person(s) Educated Patient   Education Method Explanation;Verbal cues   Education Method Verbalized understanding;Verbal cues required;Needs further instruction   Donning Prosthesis Supervision   Doffing Prosthesis Modified independent (device/increased time)                  PT Short Term Goals - 06/17/15 1115    PT SHORT TERM GOAL #1   Title Patient verbalizes understanding of prosthetic care with minimal cues for advanced issues like adjusting ply socks. (Target Date: 06/21/2015)   Status Achieved   PT SHORT TERM GOAL #2   Title Patient tolerates wear >12hrs total per day without skin issues or discomfort.  (Target Date: 06/21/2015)   Status Achieved   PT SHORT TERM GOAL #3   Title Berg Balance >45/56  (Target Date: 06/21/2015)   Baseline 06/14/15: 45/53 today   Status Achieved   PT SHORT TERM GOAL #4   Title Patient ambulates 400' with single point cane & prosthesis with only cues for deviations with no balance losses.  (Target Date: 06/21/2015)    Baseline 06/17/15: > 400 feet with gait as stated above   Status Achieved   PT SHORT TERM GOAL #5   Title Patient negoitates ramps, curbs & stairs with single point cane & prosthesis with cues only no physical assist.  (Target Date: 06/21/2015)   Baseline on 06/17/15   Status Achieved           PT Long Term Goals - 05/23/15 1400    PT LONG TERM GOAL #1   Title Patient demonstrates & verbalizes all aspects of prosthetic care correctly to enable safe use of prosthesis.  (Target Date: 07/19/2015)   Time 8   Period Weeks   Status New   PT LONG TERM GOAL #2   Title Patient toelrates wear of prosthesis >90% of awake hours without skin issues or discomfort to enable functional use throughout his day. (Target Date: 07/19/2015)   Time 8   Period Weeks   Status New   PT LONG TERM GOAL #3   Title Functional Gait Assessment >24/30 with prosthesis (Target  Date: 07/19/2015)   Time 8   Period Weeks   Status New   PT LONG TERM GOAL #4   Title Patient ambulates >1000' including grass, ramps, curbs & stairs with single point cane or less and prosthesis modified independent. (Target Date: 07/19/2015)   Time 8   Period Weeks   Status New   PT LONG TERM GOAL #5   Title Patient lifts & carries full basket of laundry around furniture with only prosthesis modified independent. (Target Date: 07/19/2015)   Time 8   Period Weeks   Status New               Plan - 07/10/15 1100    Clinical Impression Statement Patient is on target to meet LTGs on time. Patient was challenged by stepping over obstacles. Patient improved ability to negotiate ramps & curbs with prosthesis only with instruction & repetition.   Pt will benefit from skilled therapeutic intervention in order to improve on the following deficits Abnormal gait;Decreased activity tolerance;Decreased balance;Decreased endurance;Decreased mobility;Postural dysfunction;Prosthetic Dependency   Rehab Potential Good   PT Frequency 2x / week    PT Duration 8 weeks   PT Treatment/Interventions ADLs/Self Care Home Management;DME Instruction;Gait training;Stair training;Functional mobility training;Therapeutic activities;Therapeutic exercise;Balance training;Neuromuscular re-education;Patient/family education;Prosthetic Training   PT Next Visit Plan Do G-code and progress report, gait with cane on barriers; high level balance issues, working on LTG's.    Consulted and Agree with Plan of Care Patient        Problem List Patient Active Problem List   Diagnosis Date Noted  . 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 S/P BKA (below knee amputation) unilateral (HCC) 02/20/2015  . Ischemic ulcer of toe (HCC)   . Peripheral vascular disease (HCC)   . PVD (peripheral vascular disease) (HCC) 01/17/2015  . Cellulitis and abscess of foot 01/13/2015  . Cellulitis and abscess of foot excluding toe 01/13/2015  . Diabetes mellitus without complication (HCC) 01/13/2015  . Leukocytosis 01/13/2015  . Diabetes (HCC) 12/19/2007  . DYSLIPIDEMIA 12/19/2007  . ANEMIA 12/19/2007  . ANXIETY 12/19/2007  . Essential hypertension 12/19/2007  . TRANSIENT ISCHEMIC ATTACK 12/19/2007  . PERIPHERAL VASCULAR DISEASE 12/19/2007  . RENAL FAILURE 12/19/2007    Vladimir FasterWALDRON,Hanford Lust PT, DPT 07/10/2015, 6:30 PM  [facility] [address] [place], [place], [address] Phone: [phone]   Fax:  [phone]

## 2015-07-15 ENCOUNTER — Encounter: Payer: Self-pay | Admitting: Physical Therapy

## 2015-07-15 ENCOUNTER — Ambulatory Visit: Payer: Medicare Other | Admitting: Physical Therapy

## 2015-07-15 DIAGNOSIS — R269 Unspecified abnormalities of gait and mobility: Secondary | ICD-10-CM

## 2015-07-15 DIAGNOSIS — R6889 Other general symptoms and signs: Secondary | ICD-10-CM

## 2015-07-15 DIAGNOSIS — Z4789 Encounter for other orthopedic aftercare: Secondary | ICD-10-CM

## 2015-07-15 DIAGNOSIS — Z5189 Encounter for other specified aftercare: Secondary | ICD-10-CM | POA: Diagnosis not present

## 2015-07-15 DIAGNOSIS — R2681 Unsteadiness on feet: Secondary | ICD-10-CM | POA: Diagnosis not present

## 2015-07-15 DIAGNOSIS — R2689 Other abnormalities of gait and mobility: Secondary | ICD-10-CM

## 2015-07-15 DIAGNOSIS — Z89512 Acquired absence of left leg below knee: Secondary | ICD-10-CM | POA: Diagnosis not present

## 2015-07-15 DIAGNOSIS — R29818 Other symptoms and signs involving the nervous system: Secondary | ICD-10-CM | POA: Diagnosis not present

## 2015-07-15 NOTE — Therapy (Signed)
[facility] [address] [place], [place], [address] Phone: [phone]   Fax:  [phone]  Physical Therapy Treatment  Patient Details  Name: [name] MRN: [id number] Date of Birth: 1951/03/23 No Data Recorded  Encounter Date: 07/15/2015      PT End of Session - 07/15/15 1105    Visit Number 10   Number of Visits 17   Date for PT Re-Evaluation 07/22/15   Authorization Type G-Code every 10th visit   PT Start Time 1102   PT Stop Time 1145   PT Time Calculation (min) 43 min   Equipment Utilized During Treatment Gait belt   Activity Tolerance Patient tolerated treatment well   Behavior During Therapy [facility] for tasks assessed/performed      Past Medical History  Diagnosis Date  . Renal disorder   . Diabetes mellitus without complication (HCC)   . CPAP (continuous positive airway pressure) dependence   . Sleep apnea   . Legally blind in left eye, as defined in [place]   . PTSD (post-traumatic stress disorder)   . PVD (peripheral vascular disease) (HCC) 01/17/2015  . Hypertension   . Anxiety   . GERD (gastroesophageal reflux disease)     Past Surgical History  Procedure Laterality Date  . Combined kidney-pancreas transplant Left 2009  . Incision and drainage abscess Left 01/13/2015    Procedure: INCISION AND DRAINAGE OF LEFT FOOT FIRST RAY ABCESS;  Surgeon: Kathryne Hitch, MD;  Location: [facility] OR;  Service: Orthopedics;  Laterality: Left;  . Abdominal aortagram N/A 01/21/2015    Procedure: ABDOMINAL Ronny Flurry;  Surgeon: Chuck Hint, MD;  Location: [facility] CATH LAB;  Service: Cardiovascular;  Laterality: N/A;  . Lower extremity angiogram Left 01/21/2015    Procedure: LOWER EXTREMITY ANGIOGRAM;  Surgeon: Chuck Hint, MD;  Location: [facility] CATH LAB;  Service: Cardiovascular;  Laterality: Left;  . Amputation Left 01/23/2015    Procedure: FIRST RAY AMPUTATION/LEFT FOOT;  Surgeon: Nadara Mustard, MD;   Location: [facility] OR;  Service: Orthopedics;  Laterality: Left;  . Av fistula placement    . Colonoscopy    . Amputation Left 02/20/2015    Procedure: AMPUTATION BELOW KNEE;  Surgeon: Nadara Mustard, MD;  Location: [facility] OR;  Service: Orthopedics;  Laterality: Left;    There were no vitals filed for this visit.  Visit Diagnosis:  Abnormality of gait  Unsteadiness  Balance problems  Decreased functional activity tolerance  Encounter for prosthetic gait training      Subjective Assessment - 07/15/15 1105    Subjective No new complaints. No falls or pain to report.   Currently in Pain? No/denies          [facility] Adult PT Treatment/Exercise - 07/15/15 1106    Transfers   Sit to Stand 5: Supervision;With upper extremity assist;From chair/3-in-1   Sit to Stand Details Verbal cues for sequencing   Sit to Stand Details (indicate cue type and reason) cues to scoot forward for easier time to stand up   Stand to Sit 5: Supervision;To chair/3-in-1;Without upper extremity assist   Stand to Sit Details (indicate cue type and reason) Verbal cues for precautions/safety   Stand to Sit Details cues to ensure all the way to seat surface and to reach back to use arms to control descent.   Ambulation/Gait   Ambulation/Gait Yes   Ambulation/Gait Assistance 4: Min guard   Ambulation/Gait Assistance Details cues on posture, for equal step length, increased stride length.  Ambulation Distance (Feet) 220 Feet  x1; 500 x1   Assistive device Prosthesis   Gait Pattern Step-through pattern;Decreased stride length;Narrow base of support;Trunk flexed;Decreased step length - right;Decreased step length - left   Ambulation Surface Level;Indoor;Unlevel;Outdoor;Paved;Gravel;Grass   Stairs Yes   Stairs Assistance 5: Supervision   Stairs Assistance Details (indicate cue type and reason) cues on prosthetic foot placement with descending at times and to advance hands with stair negotiation.   Stair Management  Technique One rail Right;One rail Left;Two rails;Alternating pattern;Forwards   Number of Stairs 4  2 with 2 rails;1 with left rail only &1 with right rail only   Ramp Other (comment)  min guard assist with prothesis only   Ramp Details (indicate cue type and reason) cues on technique and weight shift   Curb Other (comment)  min guard assist with prosthesis only   Curb Details (indicate cue type and reason) cues on technique   High Level Balance   High Level Balance Activities Negotitating around obstacles;Negotiating over obstacles;Side stepping;Marching forwards;Marching backwards;Tandem walking  tandem gait performed fwd/bwd as well   High Level Balance Comments stepping over 4 bolsters of varied heights and around obstacles with cues on posture and technique. all other balance activities at counter top with intermittent UE support and cues on posture and ex form.                                         Prosthetics   Current prosthetic wear tolerance (days/week)  daily   Current prosthetic wear tolerance (#hours/day)  all awake hours  drying as needed   Residual limb condition  intact, no issues noted.    Education Provided Residual limb care;Proper weight-bearing schedule/adjustment;Proper wear schedule/adjustment   Person(s) Educated Patient   Education Method Explanation;Verbal cues   Education Method Verbalized understanding   Donning Prosthesis Supervision   Doffing Prosthesis Modified independent (device/increased time)            PT Short Term Goals - 06/17/15 1115    PT SHORT TERM GOAL #1   Title Patient verbalizes understanding of prosthetic care with minimal cues for advanced issues like adjusting ply socks. (Target Date: 06/21/2015)   Status Achieved   PT SHORT TERM GOAL #2   Title Patient tolerates wear >12hrs total per day without skin issues or discomfort.  (Target Date: 06/21/2015)   Status Achieved   PT SHORT TERM GOAL #3   Title Berg Balance >45/56  (Target  Date: 06/21/2015)   Baseline 06/14/15: 45/53 today   Status Achieved   PT SHORT TERM GOAL #4   Title Patient ambulates 400' with single point cane & prosthesis with only cues for deviations with no balance losses.  (Target Date: 06/21/2015)   Baseline 06/17/15: > 400 feet with gait as stated above   Status Achieved   PT SHORT TERM GOAL #5   Title Patient negoitates ramps, curbs & stairs with single point cane & prosthesis with cues only no physical assist.  (Target Date: 06/21/2015)   Baseline on 06/17/15   Status Achieved           PT Long Term Goals - 05/23/15 1400    PT LONG TERM GOAL #1   Title Patient demonstrates & verbalizes all aspects of prosthetic care correctly to enable safe use of prosthesis.  (Target Date: 07/19/2015)   Time 8   Period Weeks  Status New   PT LONG TERM GOAL #2   Title Patient toelrates wear of prosthesis >90% of awake hours without skin issues or discomfort to enable functional use throughout his day. (Target Date: 07/19/2015)   Time 8   Period Weeks   Status New   PT LONG TERM GOAL #3   Title Functional Gait Assessment >24/30 with prosthesis (Target Date: 07/19/2015)   Time 8   Period Weeks   Status New   PT LONG TERM GOAL #4   Title Patient ambulates >1000' including grass, ramps, curbs & stairs with single point cane or less and prosthesis modified independent. (Target Date: 07/19/2015)   Time 8   Period Weeks   Status New   PT LONG TERM GOAL #5   Title Patient lifts & carries full basket of laundry around furniture with only prosthesis modified independent. (Target Date: 07/19/2015)   Time 8   Period Weeks   Status New           Plan - 07/15/15 1105    Clinical Impression Statement Pt is making steady progress toward goals.   Pt will benefit from skilled therapeutic intervention in order to improve on the following deficits Abnormal gait;Decreased activity tolerance;Decreased balance;Decreased endurance;Decreased mobility;Postural  dysfunction;Prosthetic Dependency   Rehab Potential Good   PT Frequency 2x / week   PT Duration 8 weeks   PT Treatment/Interventions ADLs/Self Care Home Management;DME Instruction;Gait training;Stair training;Functional mobility training;Therapeutic activities;Therapeutic exercise;Balance training;Neuromuscular re-education;Patient/family education;Prosthetic Training   PT Next Visit Plan assess LTG's (plan of care ends this week)   Consulted and Agree with Plan of Care Patient        Problem List Patient Active Problem List   Diagnosis Date Noted  . 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 S/P BKA (below knee amputation) unilateral (HCC) 02/20/2015  . Ischemic ulcer of toe (HCC)   . Peripheral vascular disease (HCC)   . PVD (peripheral vascular disease) (HCC) 01/17/2015  . Cellulitis and abscess of foot 01/13/2015  . Cellulitis and abscess of foot excluding toe 01/13/2015  . Diabetes mellitus without complication (HCC) 01/13/2015  . Leukocytosis 01/13/2015  . Diabetes (HCC) 12/19/2007  . DYSLIPIDEMIA 12/19/2007  . ANEMIA 12/19/2007  . ANXIETY 12/19/2007  . Essential hypertension 12/19/2007  . TRANSIENT ISCHEMIC ATTACK 12/19/2007  . PERIPHERAL VASCULAR DISEASE 12/19/2007  . RENAL FAILURE 12/19/2007    Sallyanne Kuster 07/15/2015, 12:11 PM  Sallyanne Kuster, PTA, [facility] [address] [place], [place] [address] [phone] 07/15/2015, 12:11 PM   Name: [name] MRN: [id number] Date of Birth: 1951/08/15

## 2015-07-16 ENCOUNTER — Encounter: Payer: Self-pay | Admitting: Physical Therapy

## 2015-07-16 NOTE — Therapy (Signed)
[facility] [address] [place], [place], [address] Phone: [phone]   Fax:  [phone]  Patient Details  Name: [name] DikeClarence [name] MRN: [id number] Date of Birth: May 28, 1951 Referring Provider:  No ref. provider found  Encounter Date: 07/16/2015  Physical Therapy Progress Note  Dates of Reporting Period: 05/23/2015 to 07/15/2015  Objective Reports of Subjective Statement: Patient tolerating wear all day and no issues with prosthesis which enables availability to use prosthesis throughout his day.    Objective Measurements: Patient ambulates with prosthesis with cues for higher level balance activities. He has potential to ambulate full community with prosthesis with variable cadence. (K3)  Goal Update:      PT Short Term Goals - 06/17/15 1115    PT SHORT TERM GOAL #1   Title Patient verbalizes understanding of prosthetic care with minimal cues for advanced issues like adjusting ply socks. (Target Date: 06/21/2015)   Status Achieved   PT SHORT TERM GOAL #2   Title Patient tolerates wear >12hrs total per day without skin issues or discomfort.  (Target Date: 06/21/2015)   Status Achieved   PT SHORT TERM GOAL #3   Title Berg Balance >45/56  (Target Date: 06/21/2015)   Baseline 06/14/15: 45/53 today   Status Achieved   PT SHORT TERM GOAL #4   Title Patient ambulates 400' with single point cane & prosthesis with only cues for deviations with no balance losses.  (Target Date: 06/21/2015)   Baseline 06/17/15: > 400 feet with gait as stated above   Status Achieved   PT SHORT TERM GOAL #5   Title Patient negoitates ramps, curbs & stairs with single point cane & prosthesis with cues only no physical assist.  (Target Date: 06/21/2015)   Baseline on 06/17/15   Status Achieved     Patient is on target to meet LTGs next session.      PT Long Term Goals - 05/23/15 1400    PT LONG TERM GOAL #1   Title Patient demonstrates & verbalizes  all aspects of prosthetic care correctly to enable safe use of prosthesis.  (Target Date: 07/19/2015)   Time 8   Period Weeks   Status New   PT LONG TERM GOAL #2   Title Patient toelrates wear of prosthesis >90% of awake hours without skin issues or discomfort to enable functional use throughout his day. (Target Date: 07/19/2015)   Time 8   Period Weeks   Status New   PT LONG TERM GOAL #3   Title Functional Gait Assessment >24/30 with prosthesis (Target Date: 07/19/2015)   Time 8   Period Weeks   Status New   PT LONG TERM GOAL #4   Title Patient ambulates >1000' including grass, ramps, curbs & stairs with single point cane or less and prosthesis modified independent. (Target Date: 07/19/2015)   Time 8   Period Weeks   Status New   PT LONG TERM GOAL #5   Title Patient lifts & carries full basket of laundry around furniture with only prosthesis modified independent. (Target Date: 07/19/2015)   Time 8   Period Weeks   Status New      Plan: Assess LTGs.  Reason Skilled Services are Required: Prosthetic instruction to function at full community level with prosthesis.    Vladimir FasterWALDRON,Lennix Rotundo PT, DPT 07/16/2015, 2:05 PM  [facility] [address] [place], [place], [address] Phone: [phone]   Fax:  [phone]

## 2015-07-17 ENCOUNTER — Encounter: Payer: Self-pay | Admitting: Physical Therapy

## 2015-07-17 ENCOUNTER — Ambulatory Visit: Payer: Medicare Other | Admitting: Physical Therapy

## 2015-07-17 DIAGNOSIS — R6889 Other general symptoms and signs: Secondary | ICD-10-CM | POA: Diagnosis not present

## 2015-07-17 DIAGNOSIS — R269 Unspecified abnormalities of gait and mobility: Secondary | ICD-10-CM

## 2015-07-17 DIAGNOSIS — R2681 Unsteadiness on feet: Secondary | ICD-10-CM | POA: Diagnosis not present

## 2015-07-17 DIAGNOSIS — R2689 Other abnormalities of gait and mobility: Secondary | ICD-10-CM

## 2015-07-17 DIAGNOSIS — Z89512 Acquired absence of left leg below knee: Secondary | ICD-10-CM

## 2015-07-17 DIAGNOSIS — R29818 Other symptoms and signs involving the nervous system: Secondary | ICD-10-CM | POA: Diagnosis not present

## 2015-07-17 DIAGNOSIS — Z4789 Encounter for other orthopedic aftercare: Secondary | ICD-10-CM

## 2015-07-17 DIAGNOSIS — Z5189 Encounter for other specified aftercare: Secondary | ICD-10-CM | POA: Diagnosis not present

## 2015-07-18 NOTE — Therapy (Signed)
[facility] [address] [place], [place], [address] Phone: [phone]   Fax:  [phone]  Physical Therapy Treatment  Patient Details  Name: [name] MRN: [id number] Date of Birth: 05-04-1951 Referring Provider: Meridee Score, MD  Encounter Date: 07/17/2015   PHYSICAL THERAPY DISCHARGE SUMMARY  Visits from Start of Care: 11  Current functional level related to goals / functional outcomes: See below   Remaining deficits: See below   Education / Equipment: Prosthetic care  Plan: Patient agrees to discharge.  Patient goals were met. Patient is being discharged due to meeting the stated rehab goals.  ?????        Past Medical History  Diagnosis Date  . Renal disorder   . Diabetes mellitus without complication ([place])   . CPAP (continuous positive airway pressure) dependence   . Sleep apnea   . Legally blind in left eye, as defined in [place]   . PTSD (post-traumatic stress disorder)   . PVD (peripheral vascular disease) ([place]) 01/17/2015  . Hypertension   . Anxiety   . GERD (gastroesophageal reflux disease)     Past Surgical History  Procedure Laterality Date  . Combined kidney-pancreas transplant Left 2009  . Incision and drainage abscess Left 01/13/2015    Procedure: INCISION AND DRAINAGE OF LEFT FOOT FIRST RAY ABCESS;  Surgeon: Mcarthur Rossetti, MD;  Location: [facility];  Service: Orthopedics;  Laterality: Left;  . Abdominal aortagram N/A 01/21/2015    Procedure: ABDOMINAL Maxcine Ham;  Surgeon: Angelia Mould, MD;  Location: [facility] CATH LAB;  Service: Cardiovascular;  Laterality: N/A;  . Lower extremity angiogram Left 01/21/2015    Procedure: LOWER EXTREMITY ANGIOGRAM;  Surgeon: Angelia Mould, MD;  Location: [facility] CATH LAB;  Service: Cardiovascular;  Laterality: Left;  . Amputation Left 01/23/2015    Procedure: FIRST RAY AMPUTATION/LEFT FOOT;  Surgeon: Newt Minion, MD;  Location: [facility];   Service: Orthopedics;  Laterality: Left;  . Av fistula placement    . Colonoscopy    . Amputation Left 02/20/2015    Procedure: AMPUTATION BELOW KNEE;  Surgeon: Newt Minion, MD;  Location: [facility];  Service: Orthopedics;  Laterality: Left;    There were no vitals filed for this visit.  Visit Diagnosis:  Abnormality of gait  Unsteadiness  Balance problems  Decreased functional activity tolerance  Encounter for prosthetic gait training  Status post below knee amputation of left lower extremity (HCC)      Subjective Assessment - 07/17/15 1105    Subjective Everything is fine. Wearing prosthesis all awake hours without issues.   Currently in Pain? No/denies            [facility] PT Assessment - 07/17/15 1100    Assessment   Referring Provider Meridee Score, MD   Ambulation/Gait   Gait velocity 3.67 ft/sec   Functional Gait  Assessment   Gait assessed  Yes   Gait Level Surface Walks 20 ft in less than 5.5 sec, no assistive devices, good speed, no evidence for imbalance, normal gait pattern, deviates no more than 6 in outside of the 12 in walkway width.   Change in Gait Speed Able to smoothly change walking speed without loss of balance or gait deviation. Deviate no more than 6 in outside of the 12 in walkway width.   Gait with Horizontal Head Turns Performs head turns smoothly with no change in gait. Deviates no more than 6 in outside 12 in walkway width   Gait with  Vertical Head Turns Performs head turns with no change in gait. Deviates no more than 6 in outside 12 in walkway width.   Gait and Pivot Turn Pivot turns safely within 3 sec and stops quickly with no loss of balance.   Step Over Obstacle Is able to step over 2 stacked shoe boxes taped together (9 in total height) without changing gait speed. No evidence of imbalance.   Gait with Narrow Base of Support Ambulates less than 4 steps heel to toe or cannot perform without assistance.   Gait with Eyes Closed Walks 20 ft, uses  assistive device, slower speed, mild gait deviations, deviates 6-10 in outside 12 in walkway width. Ambulates 20 ft in less than 9 sec but greater than 7 sec.   Ambulating Backwards Walks 20 ft, uses assistive device, slower speed, mild gait deviations, deviates 6-10 in outside 12 in walkway width.   Steps Alternating feet, must use rail.   Total Score 24         Prosthetics Assessment - 07/17/15 1100    Prosthetics   Prosthetic Care Independent with Skin check;Residual limb care;Care of non-amputated limb;Prosthetic cleaning;Correct ply sock adjustment;Proper wear schedule/adjustment;Proper weight-bearing schedule/adjustment   Donning prosthesis  Modified independent (Device/Increase time)   Doffing prosthesis  Modified independent (Device/Increase time)                    [facility] Adult PT Treatment/Exercise - 07/17/15 1100    Transfers   Sit to Stand 7: Independent;Without upper extremity assist;From chair/3-in-1   Sit to Stand Details --   Stand to Sit 7: Independent;Without upper extremity assist;To chair/3-in-1   Stand to Sit Details (indicate cue type and reason) --   Ambulation/Gait   Ambulation/Gait Yes   Ambulation/Gait Assistance 6: Modified independent (Device/Increase time)   Ambulation Distance (Feet) 1400 Feet   Assistive device Prosthesis   Gait Pattern Within Functional Limits   Ambulation Surface Indoor;Level;Outdoor;Paved;Gravel;Grass   Stairs Yes   Stairs Assistance 6: Modified independent (Device/Increase time)   Stair Management Technique One rail Right;One rail Left;Alternating pattern;Forwards   Number of Stairs 4  2 reps   Ramp 6: Modified independent (Device)  prosthesis only   Curb 6: Modified independent (Device/increase time)  prosthesis only   Berg Balance Test   Sit to Stand Able to stand without using hands and stabilize independently   Standing Unsupported Able to stand safely 2 minutes   Sitting with Back Unsupported but Feet  Supported on Floor or Stool Able to sit safely and securely 2 minutes   Stand to Sit Sits safely with minimal use of hands   Transfers Able to transfer safely, minor use of hands   Standing Unsupported with Eyes Closed Able to stand 10 seconds safely   Standing Ubsupported with Feet Together Able to place feet together independently and stand 1 minute safely   From Standing, Reach Forward with Outstretched Arm Can reach confidently >25 cm (10")   From Standing Position, Pick up Object from Floor Able to pick up shoe safely and easily   From Standing Position, Turn to Look Behind Over each Shoulder Looks behind from both sides and weight shifts well   Turn 360 Degrees Able to turn 360 degrees safely in 4 seconds or less   Standing Unsupported, Alternately Place Feet on Step/Stool Able to stand independently and safely and complete 8 steps in 20 seconds   Standing Unsupported, One Foot in Front Able to plae foot ahead of the other  independently and hold 30 seconds   Standing on One Leg Able to lift leg independently and hold equal to or more than 3 seconds   Total Score 53   High Level Balance   High Level Balance Activities --   High Level Balance Comments --   Prosthetics   Current prosthetic wear tolerance (days/week)  daily   Current prosthetic wear tolerance (#hours/day)  all awake hours  drying as needed   Residual limb condition  intact, no issues noted.    Education Provided --   Donning Prosthesis Modified independent (device/increased time)   Doffing Prosthesis Modified independent (device/increased time)                  PT Short Term Goals - 06/17/15 1115    PT SHORT TERM GOAL #1   Title Patient verbalizes understanding of prosthetic care with minimal cues for advanced issues like adjusting ply socks. (Target Date: 06/21/2015)   Status Achieved   PT SHORT TERM GOAL #2   Title Patient tolerates wear >12hrs total per day without skin issues or discomfort.  (Target  Date: 06/21/2015)   Status Achieved   PT SHORT TERM GOAL #3   Title Berg Balance >45/56  (Target Date: 06/21/2015)   Baseline 06/14/15: 45/53 today   Status Achieved   PT SHORT TERM GOAL #4   Title Patient ambulates 400' with single point cane & prosthesis with only cues for deviations with no balance losses.  (Target Date: 06/21/2015)   Baseline 06/17/15: > 400 feet with gait as stated above   Status Achieved   PT SHORT TERM GOAL #5   Title Patient negoitates ramps, curbs & stairs with single point cane & prosthesis with cues only no physical assist.  (Target Date: 06/21/2015)   Baseline on 06/17/15   Status Achieved           PT Long Term Goals - 07/17/15 1100    PT LONG TERM GOAL #1   Title Patient demonstrates & verbalizes all aspects of prosthetic care correctly to enable safe use of prosthesis.  (Target Date: 07/19/2015)   Baseline MET 07/17/2015   Time 8   Period Weeks   Status Achieved   PT LONG TERM GOAL #2   Title Patient toelrates wear of prosthesis >90% of awake hours without skin issues or discomfort to enable functional use throughout his day. (Target Date: 07/19/2015)   Baseline MET 07/17/2015   Time 8   Period Weeks   Status Achieved   PT LONG TERM GOAL #3   Title Functional Gait Assessment >24/30 with prosthesis (Target Date: 07/19/2015)   Baseline MET 07/17/2015   Time 8   Period Weeks   Status Achieved   PT LONG TERM GOAL #4   Title Patient ambulates >1000' including grass, ramps, curbs & stairs with single point cane or less and prosthesis modified independent. (Target Date: 07/19/2015)   Baseline MET 07/17/2015   Time 8   Period Weeks   Status Achieved   PT LONG TERM GOAL #5   Title Patient lifts & carries full basket of laundry around furniture with only prosthesis modified independent. (Target Date: 07/19/2015)   Baseline MET 07/17/2015   Time 8   Period Weeks   Status Achieved               Plan - 07/17/15 1100    Clinical Impression  Statement Patient met all LTGs. He appears ready for discharge. He is using prosthesis to function at  full community level with variable cadence. He is able to demonstrate Shag dance moves which is leisure activity he enjoys.   Pt will benefit from skilled therapeutic intervention in order to improve on the following deficits Abnormal gait;Decreased activity tolerance;Decreased balance;Decreased endurance;Decreased mobility;Postural dysfunction;Prosthetic Dependency   Rehab Potential Good   PT Frequency 2x / week   PT Duration 8 weeks   PT Treatment/Interventions ADLs/Self Care Home Management;DME Instruction;Gait training;Stair training;Functional mobility training;Therapeutic activities;Therapeutic exercise;Balance training;Neuromuscular re-education;Patient/family education;Prosthetic Training   PT Next Visit Plan Discharge    Consulted and Agree with Plan of Care Patient          G-Codes - 07/22/15 1100    Functional Assessment Tool Used wears prosthesis all awake hours without issues. independent with prosthetic care.   Functional Limitation Self care   Self Care Goal Status [phone]) At least 1 percent but less than 20 percent impaired, limited or restricted   Self Care Discharge Status [phone]) At least 1 percent but less than 20 percent impaired, limited or restricted      Problem List Patient Active Problem List   Diagnosis Date Noted  . Status post below knee amputation of left lower extremity ([place]) 02/22/2015  . S/P BKA (below knee amputation) unilateral ([place]) 02/20/2015  . Ischemic ulcer of toe ([place])   . Peripheral vascular disease ([place])   . PVD (peripheral vascular disease) ([place]) 01/17/2015  . Cellulitis and abscess of foot 01/13/2015  . Cellulitis and abscess of foot excluding toe 01/13/2015  . Diabetes mellitus without complication ([place]) 05/69/7948  . Leukocytosis 01/13/2015  . Diabetes ([place]) 12/19/2007  . DYSLIPIDEMIA 12/19/2007  . ANEMIA 12/19/2007  . ANXIETY  12/19/2007  . Essential hypertension 12/19/2007  . TRANSIENT ISCHEMIC ATTACK 12/19/2007  . PERIPHERAL VASCULAR DISEASE 12/19/2007  . RENAL FAILURE 12/19/2007    Jamey Reas PT, DPT 07/18/2015, 2:25 PM  [facility] [address] [place], [place], [address] Phone: [phone]   Fax:  [phone]  Name: [name] MRN: [id number] Date of Birth: [birth year]

## 2015-07-22 ENCOUNTER — Encounter: Payer: Medicare Other | Admitting: Physical Therapy

## 2015-07-24 ENCOUNTER — Encounter: Payer: Medicare Other | Admitting: Physical Therapy

## 2015-07-29 ENCOUNTER — Encounter: Payer: Medicare Other | Admitting: Physical Therapy

## 2015-08-01 ENCOUNTER — Encounter: Payer: Medicare Other | Admitting: Physical Therapy

## 2016-02-01 ENCOUNTER — Encounter (HOSPITAL_COMMUNITY): Payer: Self-pay | Admitting: *Deleted

## 2016-02-01 ENCOUNTER — Emergency Department (HOSPITAL_COMMUNITY): Payer: Medicare Other

## 2016-02-01 ENCOUNTER — Emergency Department (HOSPITAL_COMMUNITY)
Admission: EM | Admit: 2016-02-01 | Discharge: 2016-02-02 | Disposition: A | Discharge status: Home / Self Care | Payer: Medicare Other | Attending: Emergency Medicine | Admitting: Emergency Medicine

## 2016-02-01 DIAGNOSIS — K219 Gastro-esophageal reflux disease without esophagitis: Secondary | ICD-10-CM | POA: Insufficient documentation

## 2016-02-01 DIAGNOSIS — Z7982 Long term (current) use of aspirin: Secondary | ICD-10-CM | POA: Diagnosis not present

## 2016-02-01 DIAGNOSIS — G4730 Sleep apnea, unspecified: Secondary | ICD-10-CM | POA: Insufficient documentation

## 2016-02-01 DIAGNOSIS — Z940 Kidney transplant status: Secondary | ICD-10-CM | POA: Insufficient documentation

## 2016-02-01 DIAGNOSIS — E1165 Type 2 diabetes mellitus with hyperglycemia: Secondary | ICD-10-CM | POA: Insufficient documentation

## 2016-02-01 DIAGNOSIS — Z87438 Personal history of other diseases of male genital organs: Secondary | ICD-10-CM | POA: Diagnosis not present

## 2016-02-01 DIAGNOSIS — Z7984 Long term (current) use of oral hypoglycemic drugs: Secondary | ICD-10-CM | POA: Insufficient documentation

## 2016-02-01 DIAGNOSIS — F419 Anxiety disorder, unspecified: Secondary | ICD-10-CM | POA: Insufficient documentation

## 2016-02-01 DIAGNOSIS — R739 Hyperglycemia, unspecified: Secondary | ICD-10-CM

## 2016-02-01 DIAGNOSIS — H548 Legal blindness, as defined in USA: Secondary | ICD-10-CM | POA: Insufficient documentation

## 2016-02-01 DIAGNOSIS — Z794 Long term (current) use of insulin: Secondary | ICD-10-CM | POA: Insufficient documentation

## 2016-02-01 DIAGNOSIS — R109 Unspecified abdominal pain: Secondary | ICD-10-CM | POA: Insufficient documentation

## 2016-02-01 DIAGNOSIS — Z79899 Other long term (current) drug therapy: Secondary | ICD-10-CM | POA: Diagnosis not present

## 2016-02-01 DIAGNOSIS — M545 Low back pain, unspecified: Secondary | ICD-10-CM

## 2016-02-01 DIAGNOSIS — I10 Essential (primary) hypertension: Secondary | ICD-10-CM | POA: Diagnosis not present

## 2016-02-01 LAB — COMPREHENSIVE METABOLIC PANEL
ALT: 31 U/L (ref 17–63)
AST: 27 U/L (ref 15–41)
Albumin: 3.9 g/dL (ref 3.5–5.0)
Alkaline Phosphatase: 95 U/L (ref 38–126)
Anion gap: 13 (ref 5–15)
BUN: 18 mg/dL (ref 6–20)
CO2: 25 mmol/L (ref 22–32)
Calcium: 10 mg/dL (ref 8.9–10.3)
Chloride: 98 mmol/L — ABNORMAL LOW (ref 101–111)
Creatinine, Ser: 1.23 mg/dL (ref 0.61–1.24)
GFR calc Af Amer: >60 mL/min (ref >60)
GFR calc non Af Amer: >60 mL/min (ref >60)
Glucose, Bld: 496 mg/dL — ABNORMAL HIGH (ref 65–99)
Potassium: 4.7 mmol/L (ref 3.5–5.1)
Sodium: 136 mmol/L (ref 135–145)
Total Bilirubin: 1.2 mg/dL (ref 0.3–1.2)
Total Protein: 6.7 g/dL (ref 6.5–8.1)

## 2016-02-01 LAB — CBC
HCT: 43.5 % (ref 39.0–52.0)
Hemoglobin: 14 g/dL (ref 13.0–17.0)
MCH: 26.3 pg (ref 26.0–34.0)
MCHC: 32.2 g/dL (ref 30.0–36.0)
MCV: 81.6 fL (ref 78.0–100.0)
Platelets: 164 10*3/uL (ref 150–400)
RBC: 5.33 MIL/uL (ref 4.22–5.81)
RDW: 13.7 % (ref 11.5–15.5)
WBC: 5.8 10*3/uL (ref 4.0–10.5)

## 2016-02-01 LAB — URINE MICROSCOPIC-ADD ON: Bacteria, UA: NONE SEEN

## 2016-02-01 LAB — URINALYSIS, ROUTINE W REFLEX MICROSCOPIC
Bilirubin Urine: NEGATIVE
Glucose, UA: >1000 mg/dL — AB
Hgb urine dipstick: NEGATIVE
Ketones, ur: NEGATIVE mg/dL
Leukocytes, UA: NEGATIVE
Nitrite: NEGATIVE
Protein, ur: NEGATIVE mg/dL
Specific Gravity, Urine: 1.035 — ABNORMAL HIGH (ref 1.005–1.030)
pH: 5.5 (ref 5.0–8.0)

## 2016-02-01 LAB — LIPASE, BLOOD: Lipase: 55 U/L — ABNORMAL HIGH (ref 11–51)

## 2016-02-01 MED ORDER — HYDROCODONE-ACETAMINOPHEN 5-325 MG PO TABS: 1.0000 | ORAL_TABLET | Freq: Four times a day (QID) | ORAL | Status: DC | PRN

## 2016-02-01 MED ORDER — INSULIN ASPART 100 UNIT/ML ~~LOC~~ SOLN
12.0000 [IU] | Freq: Once | SUBCUTANEOUS | Status: AC
  Administered 2016-02-01: 12 [IU] via SUBCUTANEOUS
  Filled 2016-02-01: qty 1

## 2016-02-01 MED ORDER — HYDROCODONE-ACETAMINOPHEN 5-325 MG PO TABS
2.0000 | ORAL_TABLET | Freq: Once | ORAL | Status: AC
  Administered 2016-02-01: 2 via ORAL
  Filled 2016-02-01: qty 2

## 2016-02-01 MED ORDER — SODIUM CHLORIDE 0.9 % IV BOLUS (SEPSIS)
1000.0000 mL | Freq: Once | INTRAVENOUS | Status: AC
  Administered 2016-02-01: 1000 mL via INTRAVENOUS

## 2016-02-01 NOTE — ED Provider Notes (Addendum)
CSN: [id number]     Arrival date & time 02/01/16  2120 History   First MD Initiated Contact with Patient 02/01/16 2158     Chief Complaint  Patient presents with  . Flank Pain  . Abdominal Pain     (Consider location/radiation/quality/duration/timing/severity/associated sxs/prior Treatment) Patient is a 65 y.o. male presenting with flank pain and abdominal pain. The history is provided by the patient.  Flank Pain Associated symptoms include abdominal pain. Pertinent negatives include no chest pain, no headaches and no shortness of breath.  Abdominal Pain Associated symptoms: no chest pain, no chills, no diarrhea, no fever, no shortness of breath, no sore throat and no vomiting   Patient c/o left flank pain onset 2-3 days ago. Pain constant, dull, mod-severe, worse w certain position changes/movements.  Patient denies hx same pain. Patient denies back injury or strain. No radicular pain. No numbness/weakness. Denies scrotal or testicular pain. No dysuria or hematuria. Remote hx left renal transplant. Compliant w normal meds. Making normal amount urine. Denies hx kidney stones. No fever or chills.      Past Medical History  Diagnosis Date  . Renal disorder   . Diabetes mellitus without complication (HCC)   . CPAP (continuous positive airway pressure) dependence   . Sleep apnea   . Legally blind in left eye, as defined in [place]   . PTSD (post-traumatic stress disorder)   . PVD (peripheral vascular disease) (HCC) 01/17/2015  . Hypertension   . Anxiety   . GERD (gastroesophageal reflux disease)    Past Surgical History  Procedure Laterality Date  . Combined kidney-pancreas transplant Left 2009  . Incision and drainage abscess Left 01/13/2015    Procedure: INCISION AND DRAINAGE OF LEFT FOOT FIRST RAY ABCESS;  Surgeon: Kathryne Hitch, MD;  Location: [facility];  Service: Orthopedics;  Laterality: Left;  . Abdominal aortagram N/A 01/21/2015    Procedure: ABDOMINAL Ronny Flurry;  Surgeon:  Chuck Hint, MD;  Location: [facility] CATH LAB;  Service: Cardiovascular;  Laterality: N/A;  . Lower extremity angiogram Left 01/21/2015    Procedure: LOWER EXTREMITY ANGIOGRAM;  Surgeon: Chuck Hint, MD;  Location: [facility] CATH LAB;  Service: Cardiovascular;  Laterality: Left;  . Amputation Left 01/23/2015    Procedure: FIRST RAY AMPUTATION/LEFT FOOT;  Surgeon: Nadara Mustard, MD;  Location: [facility] OR;  Service: Orthopedics;  Laterality: Left;  . Av fistula placement    . Colonoscopy    . Amputation Left 02/20/2015    Procedure: AMPUTATION BELOW KNEE;  Surgeon: Nadara Mustard, MD;  Location: [facility] OR;  Service: Orthopedics;  Laterality: Left;   No family history on file. Social History  Substance Use Topics  . Smoking status: Former Games developer  . Smokeless tobacco: Never Used     Comment: quit i 1994ish  . Alcohol Use: Yes     Comment: 1 beer rarely     Review of Systems  Constitutional: Negative for fever and chills.  HENT: Negative for sore throat.   Eyes: Negative for redness.  Respiratory: Negative for shortness of breath.   Cardiovascular: Negative for chest pain.  Gastrointestinal: Positive for abdominal pain. Negative for vomiting and diarrhea.  Genitourinary: Positive for flank pain.  Musculoskeletal: Positive for back pain. Negative for neck pain.  Skin: Negative for rash.  Neurological: Negative for weakness, numbness and headaches.  Hematological: Does not bruise/bleed easily.  Psychiatric/Behavioral: Negative for confusion.      Allergies  Review of patient's allergies indicates no known allergies.  Home Medications  Prior to Admission medications   Medication Sig Start Date End Date Taking? Authorizing Provider  acyclovir (ZOVIRAX) 800 MG tablet Take 1 tablet (800 mg total) by mouth 5 (five) times daily. 03/14/15   Mirian Mo, MD  allopurinol (ZYLOPRIM) 300 MG tablet Take 300 mg by mouth 2 (two) times daily.     Historical Provider, MD  amLODipine (NORVASC) 10  MG tablet Take 10 mg by mouth every morning.    Historical Provider, MD  aspirin EC 325 MG EC tablet Take 1 tablet (325 mg total) by mouth daily. 03/05/15   Jacquelynn Cree, PA-C  cephALEXin (KEFLEX) 500 MG capsule Take 1 capsule (500 mg total) by mouth 4 (four) times daily. 03/19/15   Trixie Dredge, PA-C  docusate sodium (COLACE) 100 MG capsule Take 100 mg by mouth 2 (two) times daily as needed for mild constipation.     Historical Provider, MD  gabapentin (NEURONTIN) 300 MG capsule Take 1 capsule (300 mg total) by mouth 2 (two) times daily. 03/05/15   Evlyn Kanner Love, PA-C  glipiZIDE (GLUCOTROL) 10 MG tablet Take 10 mg by mouth daily before breakfast.    Historical Provider, MD  Insulin Detemir (LEVEMIR) 100 UNIT/ML Pen Inject 8 Units into the skin daily at 10 pm. 03/05/15   Evlyn Kanner Love, PA-C  Lancets (FREESTYLE) lancets Use as instructed 01/25/15   Richarda Overlie, MD  mirtazapine (REMERON) 15 MG tablet Take 7.5 mg by mouth at bedtime as needed (insomnia).    Historical Provider, MD  mycophenolate (CELLCEPT) 250 MG capsule Take 1,000 mg by mouth 2 (two) times daily.    Historical Provider, MD  oxyCODONE 10 MG TABS Take 0.5-1 tablets (5-10 mg total) by mouth every 6 (six) hours as needed for moderate pain. 03/05/15   Jacquelynn Cree, PA-C  oxyCODONE-acetaminophen (PERCOCET/ROXICET) 5-325 MG per tablet Take 1-2 tablets by mouth every 4 (four) hours as needed for severe pain. 03/14/15   Mirian Mo, MD  pantoprazole (PROTONIX) 40 MG tablet Take 1 tablet (40 mg total) by mouth daily. 01/25/15   Richarda Overlie, MD  prazosin (MINIPRESS) 2 MG capsule Take 6 mg by mouth at bedtime.    Historical Provider, MD  simvastatin (ZOCOR) 40 MG tablet Take 20 mg by mouth at bedtime.    Historical Provider, MD  tacrolimus (PROGRAF) 1 MG capsule Take 5 mg by mouth 2 (two) times daily.     Historical Provider, MD   BP 146/62 mmHg  Pulse 78  Temp(Src) 97.5 F (36.4 C) (Oral)  Resp 18  Ht 5\' 11"  (1.803 m)  Wt 83.915 kg  BMI 25.81  kg/m2  SpO2 95% Physical Exam  Constitutional: He is oriented to person, place, and time. He appears well-developed and well-nourished. No distress.  HENT:  Mouth/Throat: Oropharynx is clear and moist.  Eyes: Conjunctivae are normal. No scleral icterus.  Neck: Neck supple. No tracheal deviation present.  Cardiovascular: Normal rate, regular rhythm, normal heart sounds and intact distal pulses.   Pulmonary/Chest: Effort normal and breath sounds normal. No accessory muscle usage. No respiratory distress.  Abdominal: Soft. Bowel sounds are normal. He exhibits no distension and no mass. There is no tenderness. There is no rebound and no guarding.  Genitourinary:  No cva tenderness. No scrotal/testicular pain or tenderness.   Musculoskeletal: Normal range of motion. He exhibits no edema.  TLS spine non tender. ?mild left lumbar muscular tenderness.   Neurological: He is alert and oriented to person, place, and time.  Skin: Skin  is warm and dry. No rash noted. He is not diaphoretic.  No shingles/rash in area of pain  Psychiatric: He has a normal mood and affect.  Nursing note and vitals reviewed.   ED Course  Procedures (including critical care time) Labs Review   Results for orders placed or performed during the hospital encounter of 02/01/16  Lipase, blood  Result Value Ref Range   Lipase 55 (H) 11 - 51 U/L  Comprehensive metabolic panel  Result Value Ref Range   Sodium 136 135 - 145 mmol/L   Potassium 4.7 3.5 - 5.1 mmol/L   Chloride 98 (L) 101 - 111 mmol/L   CO2 25 22 - 32 mmol/L   Glucose, Bld 496 (H) 65 - 99 mg/dL   BUN 18 6 - 20 mg/dL   Creatinine, Ser 1.61 0.61 - 1.24 mg/dL   Calcium 09.6 8.9 - 04.5 mg/dL   Total Protein 6.7 6.5 - 8.1 g/dL   Albumin 3.9 3.5 - 5.0 g/dL   AST 27 15 - 41 U/L   ALT 31 17 - 63 U/L   Alkaline Phosphatase 95 38 - 126 U/L   Total Bilirubin 1.2 0.3 - 1.2 mg/dL   GFR calc non Af Amer >60 >60 mL/min   GFR calc Af Amer >60 >60 mL/min   Anion gap  13 5 - 15  CBC  Result Value Ref Range   WBC 5.8 4.0 - 10.5 K/uL   RBC 5.33 4.22 - 5.81 MIL/uL   Hemoglobin 14.0 13.0 - 17.0 g/dL   HCT 40.9 81.1 - 91.4 %   MCV 81.6 78.0 - 100.0 fL   MCH 26.3 26.0 - 34.0 pg   MCHC 32.2 30.0 - 36.0 g/dL   RDW 78.2 95.6 - 21.3 %   Platelets 164 150 - 400 K/uL  Urinalysis, Routine w reflex microscopic  Result Value Ref Range   Color, Urine YELLOW YELLOW   APPearance CLEAR CLEAR   Specific Gravity, Urine 1.035 (H) 1.005 - 1.030   pH 5.5 5.0 - 8.0   Glucose, UA >1000 (A) NEGATIVE mg/dL   Hgb urine dipstick NEGATIVE NEGATIVE   Bilirubin Urine NEGATIVE NEGATIVE   Ketones, ur NEGATIVE NEGATIVE mg/dL   Protein, ur NEGATIVE NEGATIVE mg/dL   Nitrite NEGATIVE NEGATIVE   Leukocytes, UA NEGATIVE NEGATIVE  Urine microscopic-add on  Result Value Ref Range   Squamous Epithelial / LPF 0-5 (A) NONE SEEN   WBC, UA 0-5 0 - 5 WBC/hpf   RBC / HPF 0-5 0 - 5 RBC/hpf   Bacteria, UA NONE SEEN NONE SEEN   Ct Abdomen Pelvis Wo Contrast  02/01/2016  CLINICAL DATA:  Severe left-sided flank pain and abdomen pain. EXAM: CT ABDOMEN AND PELVIS WITHOUT CONTRAST TECHNIQUE: Multidetector CT imaging of the abdomen and pelvis was performed following the standard protocol without IV contrast. COMPARISON:  04/03/2010 FINDINGS: The left lower quadrant transplant kidney appears unremarkable and unchanged. The transplant pancreas also appears unremarkable and unchanged. No acute findings are evident about either transplant. Urinary bladder is unremarkable. Transplant kidney collecting system and ureter are unremarkable. There are unremarkable unenhanced appearances of the liver, bile ducts, spleen and adrenals. The native pancreas is mildly atrophic. The native kidneys are profoundly atrophic. Bowel is unremarkable. The abdominal aorta is normal in caliber. There is mild atherosclerotic calcification. There is no adenopathy in the abdomen or pelvis. No acute inflammatory changes are evident in  the abdomen or pelvis. There is no ascites. There is no extraluminal air. No  significant abnormality in the lower chest. Mild stable linear scarring in the bases. No significant skeletal lesion is evident. IMPRESSION: No acute findings are evident in the abdomen or pelvis. Unremarkable unchanged appearances of the transplant kidney and pancreas. Electronically Signed   By: Ellery Plunkaniel R Mitchell M.D.   On: 02/01/2016 22:51      I have personally reviewed and evaluated these images and lab results as part of my medical decision-making.    MDM   Labs sent.  Hydrocodone po.  Imaging/ct.  Reviewed nursing notes and prior charts for additional history.   Spouse arrives, indicates recently patient had missed a step awkwardly a couple times, and may have tweeked something in back/side.   Glucose high, other labs and imaging studies unremarkable.  Hco3 normal.   Iv ns bolus.  novolog sq.   Will plan recheck cbg.  Response to ivf/novolog, repeat cbgs pending - signed out to Dr Elesa MassedWard that when glucose improved, patient able to be d/c to home.       Cathren LaineKevin Dayne Dekay, MD 02/02/16 [phone]

## 2016-02-01 NOTE — ED Notes (Signed)
Patient presents with c/o severe pain to the left flank that radiates around to the lower abd (had kidney transplant 2009) denies urinary symptoms

## 2016-02-01 NOTE — Discharge Instructions (Signed)
It was our pleasure to provide your ER care today - we hope that you feel better.  Rest. Drink adequate fluids/water.  Avoid bending at waist or heavy lifting > 20 lbs for the next week.   You may take hydrocodone as need for pain. No driving when taking hydrocodone. Also, do not take tylenol or acetaminophen containing medication when taking hydrocodone.  From today's labs, your glucose level is very high (496) - continue insulin, follow diabetic diet, check sugars 4x/day and record values, and follow up with your doctor this Monday.  Return to ER if worse, new symptoms, fevers, intractable pain, medical emergency, other concern.  You were given pain medication in the ER - no driving for the next 4 hours.      Back Pain, Adult Back pain is very common in adults.The cause of back pain is rarely dangerous and the pain often gets better over time.The cause of your back pain may not be known. Some common causes of back pain include:  Strain of the muscles or ligaments supporting the spine.  Wear and tear (degeneration) of the spinal disks.  Arthritis.  Direct injury to the back. For many people, back pain may return. Since back pain is rarely dangerous, most people can learn to manage this condition on their own. HOME CARE INSTRUCTIONS Watch your back pain for any changes. The following actions may help to lessen any discomfort you are feeling:  Remain active. It is stressful on your back to sit or stand in one place for long periods of time. Do not sit, drive, or stand in one place for more than 30 minutes at a time. Take short walks on even surfaces as soon as you are able.Try to increase the length of time you walk each day.  Exercise regularly as directed by your health care provider. Exercise helps your back heal faster. It also helps avoid future injury by keeping your muscles strong and flexible.  Do not stay in bed.Resting more than 1-2 days can delay your  recovery.  Pay attention to your body when you bend and lift. The most comfortable positions are those that put less stress on your recovering back. Always use proper lifting techniques, including:  Bending your knees.  Keeping the load close to your body.  Avoiding twisting.  Find a comfortable position to sleep. Use a firm mattress and lie on your side with your knees slightly bent. If you lie on your back, put a pillow under your knees.  Avoid feeling anxious or stressed.Stress increases muscle tension and can worsen back pain.It is important to recognize when you are anxious or stressed and learn ways to manage it, such as with exercise.  Take medicines only as directed by your health care provider. Over-the-counter medicines to reduce pain and inflammation are often the most helpful.Your health care provider may prescribe muscle relaxant drugs.These medicines help dull your pain so you can more quickly return to your normal activities and healthy exercise.  Apply ice to the injured area:  Put ice in a plastic bag.  Place a towel between your skin and the bag.  Leave the ice on for 20 minutes, 2-3 times a day for the first 2-3 days. After that, ice and heat may be alternated to reduce pain and spasms.  Maintain a healthy weight. Excess weight puts extra stress on your back and makes it difficult to maintain good posture. SEEK MEDICAL CARE IF:  You have pain that is not relieved with  rest or medicine.  You have increasing pain going down into the legs or buttocks.  You have pain that does not improve in one week.  You have night pain.  You lose weight.  You have a fever or chills. SEEK IMMEDIATE MEDICAL CARE IF:   You develop new bowel or bladder control problems.  You have unusual weakness or numbness in your arms or legs.  You develop nausea or vomiting.  You develop abdominal pain.  You feel faint.   This information is not intended to replace advice given  to you by your health care provider. Make sure you discuss any questions you have with your health care provider.   Document Released: 09/14/2005 Document Revised: 10/05/2014 Document Reviewed: 01/16/2014 Elsevier Interactive Patient Education 2016 [employer]  Flank Pain Flank pain refers to pain that is located on the side of the body between the upper abdomen and the back. The pain may occur over a short period of time (acute) or may be long-term or reoccurring (chronic). It may be mild or severe. Flank pain can be caused by many things. CAUSES  Some of the more common causes of flank pain include:  Muscle strains.   Muscle spasms.   A disease of your spine (vertebral disk disease).   A lung infection (pneumonia).   Fluid around your lungs (pulmonary edema).   A kidney infection.   Kidney stones.   A very painful skin rash caused by the chickenpox virus (shingles).   Gallbladder disease.  HOME CARE INSTRUCTIONS  Home care will depend on the cause of your pain. In general,  Rest as directed by your caregiver.  Drink enough fluids to keep your urine clear or pale yellow.  Only take over-the-counter or prescription medicines as directed by your caregiver. Some medicines may help relieve the pain.  Tell your caregiver about any changes in your pain.  Follow up with your caregiver as directed. SEEK IMMEDIATE MEDICAL CARE IF:   Your pain is not controlled with medicine.   You have new or worsening symptoms.  Your pain increases.   You have abdominal pain.   You have shortness of breath.   You have persistent nausea or vomiting.   You have swelling in your abdomen.   You feel faint or pass out.   You have blood in your urine.  You have a fever or persistent symptoms for more than 2-3 days.  You have a fever and your symptoms suddenly get worse. MAKE SURE YOU:   Understand these instructions.  Will watch your condition.  Will get help  right away if you are not doing well or get worse.   This information is not intended to replace advice given to you by your health care provider. Make sure you discuss any questions you have with your health care provider.   Document Released: 11/05/2005 Document Revised: 06/08/2012 Document Reviewed: 04/28/2012 Elsevier Interactive Patient Education 2016 [employer]    Hyperglycemia Hyperglycemia occurs when the glucose (sugar) in your blood is too high. Hyperglycemia can happen for many reasons, but it most often happens to people who do not know they have diabetes or are not managing their diabetes properly.  CAUSES  Whether you have diabetes or not, there are other causes of hyperglycemia. Hyperglycemia can occur when you have diabetes, but it can also occur in other situations that you might not be as aware of, such as: Diabetes  If you have diabetes and are having problems controlling your  blood glucose, hyperglycemia could occur because of some of the following reasons:  Not following your meal plan.  Not taking your diabetes medications or not taking it properly.  Exercising less or doing less activity than you normally do.  Being sick. Pre-diabetes  This cannot be ignored. Before people develop Type 2 diabetes, they almost always have "pre-diabetes." This is when your blood glucose levels are higher than normal, but not yet high enough to be diagnosed as diabetes. Research has shown that some long-term damage to the body, especially the heart and circulatory system, may already be occurring during pre-diabetes. If you take action to manage your blood glucose when you have pre-diabetes, you may delay or prevent Type 2 diabetes from developing. Stress  If you have diabetes, you may be "diet" controlled or on oral medications or insulin to control your diabetes. However, you may find that your blood glucose is higher than usual in the hospital whether you have diabetes or not.  This is often referred to as "stress hyperglycemia." Stress can elevate your blood glucose. This happens because of hormones put out by the body during times of stress. If stress has been the cause of your high blood glucose, it can be followed regularly by your caregiver. That way he/she can make sure your hyperglycemia does not continue to get worse or progress to diabetes. Steroids  Steroids are medications that act on the infection fighting system (immune system) to block inflammation or infection. One side effect can be a rise in blood glucose. Most people can produce enough extra insulin to allow for this rise, but for those who cannot, steroids make blood glucose levels go even higher. It is not unusual for steroid treatments to "uncover" diabetes that is developing. It is not always possible to determine if the hyperglycemia will go away after the steroids are stopped. A special blood test called an A1c is sometimes done to determine if your blood glucose was elevated before the steroids were started. SYMPTOMS  Thirsty.  Frequent urination.  Dry mouth.  Blurred vision.  Tired or fatigue.  Weakness.  Sleepy.  Tingling in feet or leg. DIAGNOSIS  Diagnosis is made by monitoring blood glucose in one or all of the following ways:  A1c test. This is a chemical found in your blood.  Fingerstick blood glucose monitoring.  Laboratory results. TREATMENT  First, knowing the cause of the hyperglycemia is important before the hyperglycemia can be treated. Treatment may include, but is not be limited to:  Education.  Change or adjustment in medications.  Change or adjustment in meal plan.  Treatment for an illness, infection, etc.  More frequent blood glucose monitoring.  Change in exercise plan.  Decreasing or stopping steroids.  Lifestyle changes. HOME CARE INSTRUCTIONS   Test your blood glucose as directed.  Exercise regularly. Your caregiver will give you instructions  about exercise. Pre-diabetes or diabetes which comes on with stress is helped by exercising.  Eat wholesome, balanced meals. Eat often and at regular, fixed times. Your caregiver or nutritionist will give you a meal plan to guide your sugar intake.  Being at an ideal weight is important. If needed, losing as little as 10 to 15 pounds may help improve blood glucose levels. SEEK MEDICAL CARE IF:   You have questions about medicine, activity, or diet.  You continue to have symptoms (problems such as increased thirst, urination, or weight gain). SEEK IMMEDIATE MEDICAL CARE IF:   You are vomiting or have diarrhea.  Your breath smells fruity.  You are breathing faster or slower.  You are very sleepy or incoherent.  You have numbness, tingling, or pain in your feet or hands.  You have chest pain.  Your symptoms get worse even though you have been following your caregiver's orders.  If you have any other questions or concerns.   This information is not intended to replace advice given to you by your health care provider. Make sure you discuss any questions you have with your health care provider.   Document Released: 03/10/2001 Document Revised: 12/07/2011 Document Reviewed: 05/21/2015 Elsevier Interactive Patient Education 2016 [employer]    Diabetes Mellitus and Food It is important for you to manage your blood sugar (glucose) level. Your blood glucose level can be greatly affected by what you eat. Eating healthier foods in the appropriate amounts throughout the day at about the same time each day will help you control your blood glucose level. It can also help slow or prevent worsening of your diabetes mellitus. Healthy eating may even help you improve the level of your blood pressure and reach or maintain a healthy weight.  General recommendations for healthful eating and cooking habits include:  Eating meals and snacks regularly. Avoid going long periods of time without eating  to lose weight.  Eating a diet that consists mainly of plant-based foods, such as fruits, vegetables, nuts, legumes, and whole grains.  Using low-heat cooking methods, such as baking, instead of high-heat cooking methods, such as deep frying. Work with your dietitian to make sure you understand how to use the Nutrition Facts information on food labels. HOW CAN FOOD AFFECT ME? Carbohydrates Carbohydrates affect your blood glucose level more than any other type of food. Your dietitian will help you determine how many carbohydrates to eat at each meal and teach you how to count carbohydrates. Counting carbohydrates is important to keep your blood glucose at a healthy level, especially if you are using insulin or taking certain medicines for diabetes mellitus. Alcohol Alcohol can cause sudden decreases in blood glucose (hypoglycemia), especially if you use insulin or take certain medicines for diabetes mellitus. Hypoglycemia can be a life-threatening condition. Symptoms of hypoglycemia (sleepiness, dizziness, and disorientation) are similar to symptoms of having too much alcohol.  If your health care provider has given you approval to drink alcohol, do so in moderation and use the following guidelines:  Women should not have more than one drink per day, and men should not have more than two drinks per day. One drink is equal to:  12 oz of beer.  5 oz of wine.  1 oz of hard liquor.  Do not drink on an empty stomach.  Keep yourself hydrated. Have water, diet soda, or unsweetened iced tea.  Regular soda, juice, and other mixers might contain a lot of carbohydrates and should be counted. WHAT FOODS ARE NOT RECOMMENDED? As you make food choices, it is important to remember that all foods are not the same. Some foods have fewer nutrients per serving than other foods, even though they might have the same number of calories or carbohydrates. It is difficult to get your body what it needs when you eat  foods with fewer nutrients. Examples of foods that you should avoid that are high in calories and carbohydrates but low in nutrients include:  Trans fats (most processed foods list trans fats on the Nutrition Facts label).  Regular soda.  Juice.  Candy.  Sweets, such as cake, pie, doughnuts, and  cookies.  Fried foods. WHAT FOODS CAN I EAT? Eat nutrient-rich foods, which will nourish your body and keep you healthy. The food you should eat also will depend on several factors, including:  The calories you need.  The medicines you take.  Your weight.  Your blood glucose level.  Your blood pressure level.  Your cholesterol level. You should eat a variety of foods, including:  Protein.  Lean cuts of meat.  Proteins low in saturated fats, such as fish, egg whites, and beans. Avoid processed meats.  Fruits and vegetables.  Fruits and vegetables that may help control blood glucose levels, such as apples, mangoes, and yams.  Dairy products.  Choose fat-free or low-fat dairy products, such as milk, yogurt, and cheese.  Grains, bread, pasta, and rice.  Choose whole grain products, such as multigrain bread, whole oats, and brown rice. These foods may help control blood pressure.  Fats.  Foods containing healthful fats, such as nuts, avocado, olive oil, canola oil, and fish. DOES EVERYONE WITH DIABETES MELLITUS HAVE THE SAME MEAL PLAN? Because every person with diabetes mellitus is different, there is not one meal plan that works for everyone. It is very important that you meet with a dietitian who will help you create a meal plan that is just right for you.   This information is not intended to replace advice given to you by your health care provider. Make sure you discuss any questions you have with your health care provider.   Document Released: 06/11/2005 Document Revised: 10/05/2014 Document Reviewed: 08/11/2013 Elsevier Interactive Patient Education [employer].

## 2016-02-01 NOTE — ED Notes (Signed)
Pt returned from CT °

## 2016-02-01 NOTE — ED Notes (Signed)
Pt in CT.

## 2016-02-02 LAB — CBG MONITORING, ED: Glucose-Capillary: 362 mg/dL — ABNORMAL HIGH (ref 65–99)

## 2016-02-02 NOTE — ED Notes (Signed)
CBG complete.  362.  Not crossing over from meter.

## 2016-02-02 NOTE — ED Provider Notes (Signed)
12:50 AM  Signed out from Dr. Denton LankSteinl.  Patient here with what was thought to be musculoskeletal back pain. Was found to be hyperglycemic but not in DKA. Plan was to discharge patient home if blood glucose improved. Dr. Denton LankSteinl was okay with blood sugar in the 300s. Blood glucose 362. We'll discharge home.  Layla MawKristen N Ward, DO 02/02/16 [phone]

## 2016-04-16 DIAGNOSIS — Z940 Kidney transplant status: Secondary | ICD-10-CM | POA: Diagnosis not present

## 2016-04-16 DIAGNOSIS — E785 Hyperlipidemia, unspecified: Secondary | ICD-10-CM | POA: Diagnosis not present

## 2016-04-16 DIAGNOSIS — M109 Gout, unspecified: Secondary | ICD-10-CM | POA: Diagnosis not present

## 2016-04-16 DIAGNOSIS — E559 Vitamin D deficiency, unspecified: Secondary | ICD-10-CM | POA: Diagnosis not present

## 2016-04-16 DIAGNOSIS — Z8639 Personal history of other endocrine, nutritional and metabolic disease: Secondary | ICD-10-CM | POA: Diagnosis not present

## 2016-04-16 DIAGNOSIS — I10 Essential (primary) hypertension: Secondary | ICD-10-CM | POA: Diagnosis not present

## 2016-04-16 DIAGNOSIS — T86899 Unspecified complication of other transplanted tissue: Secondary | ICD-10-CM | POA: Diagnosis not present

## 2016-10-17 ENCOUNTER — Emergency Department (HOSPITAL_COMMUNITY)
Admission: EM | Admit: 2016-10-17 | Discharge: 2016-10-17 | Disposition: A | Discharge status: Home / Self Care | Payer: Medicare Other | Attending: Emergency Medicine | Admitting: Emergency Medicine

## 2016-10-17 ENCOUNTER — Encounter (HOSPITAL_COMMUNITY): Payer: Self-pay | Admitting: Emergency Medicine

## 2016-10-17 ENCOUNTER — Emergency Department (HOSPITAL_COMMUNITY): Payer: Medicare Other

## 2016-10-17 DIAGNOSIS — Z8673 Personal history of transient ischemic attack (TIA), and cerebral infarction without residual deficits: Secondary | ICD-10-CM | POA: Diagnosis not present

## 2016-10-17 DIAGNOSIS — Z794 Long term (current) use of insulin: Secondary | ICD-10-CM | POA: Insufficient documentation

## 2016-10-17 DIAGNOSIS — Z7982 Long term (current) use of aspirin: Secondary | ICD-10-CM | POA: Insufficient documentation

## 2016-10-17 DIAGNOSIS — E119 Type 2 diabetes mellitus without complications: Secondary | ICD-10-CM | POA: Diagnosis not present

## 2016-10-17 DIAGNOSIS — J09X2 Influenza due to identified novel influenza A virus with other respiratory manifestations: Secondary | ICD-10-CM | POA: Diagnosis not present

## 2016-10-17 DIAGNOSIS — Z87891 Personal history of nicotine dependence: Secondary | ICD-10-CM | POA: Diagnosis not present

## 2016-10-17 DIAGNOSIS — Z79899 Other long term (current) drug therapy: Secondary | ICD-10-CM | POA: Diagnosis not present

## 2016-10-17 DIAGNOSIS — I10 Essential (primary) hypertension: Secondary | ICD-10-CM | POA: Diagnosis not present

## 2016-10-17 DIAGNOSIS — R05 Cough: Secondary | ICD-10-CM | POA: Diagnosis present

## 2016-10-17 DIAGNOSIS — J101 Influenza due to other identified influenza virus with other respiratory manifestations: Secondary | ICD-10-CM

## 2016-10-17 LAB — COMPREHENSIVE METABOLIC PANEL
ALT: 21 U/L (ref 17–63)
AST: 16 U/L (ref 15–41)
Albumin: 4.4 g/dL (ref 3.5–5.0)
Alkaline Phosphatase: 79 U/L (ref 38–126)
Anion gap: 9 (ref 5–15)
BUN: 13 mg/dL (ref 6–20)
CO2: 25 mmol/L (ref 22–32)
Calcium: 9 mg/dL (ref 8.9–10.3)
Chloride: 100 mmol/L — ABNORMAL LOW (ref 101–111)
Creatinine, Ser: 0.84 mg/dL (ref 0.61–1.24)
GFR calc Af Amer: >60 mL/min (ref >60)
GFR calc non Af Amer: >60 mL/min (ref >60)
Glucose, Bld: 315 mg/dL — ABNORMAL HIGH (ref 65–99)
Potassium: 4.2 mmol/L (ref 3.5–5.1)
Sodium: 134 mmol/L — ABNORMAL LOW (ref 135–145)
Total Bilirubin: 0.8 mg/dL (ref 0.3–1.2)
Total Protein: 7.4 g/dL (ref 6.5–8.1)

## 2016-10-17 LAB — CBC
HCT: 40.5 % (ref 39.0–52.0)
Hemoglobin: 13.4 g/dL (ref 13.0–17.0)
MCH: 26.7 pg (ref 26.0–34.0)
MCHC: 33.1 g/dL (ref 30.0–36.0)
MCV: 80.7 fL (ref 78.0–100.0)
Platelets: 112 10*3/uL — ABNORMAL LOW (ref 150–400)
RBC: 5.02 MIL/uL (ref 4.22–5.81)
RDW: 13.9 % (ref 11.5–15.5)
WBC: 5.9 10*3/uL (ref 4.0–10.5)

## 2016-10-17 LAB — INFLUENZA PANEL BY PCR (TYPE A & B)
Influenza A By PCR: POSITIVE — AB
Influenza B By PCR: NEGATIVE

## 2016-10-17 LAB — I-STAT CG4 LACTIC ACID, ED: Lactic Acid, Venous: 1.22 mmol/L (ref 0.5–1.9)

## 2016-10-17 MED ORDER — OSELTAMIVIR PHOSPHATE 75 MG PO CAPS: 75.0000 mg | ORAL_CAPSULE | Freq: Two times a day (BID) | ORAL | 0 refills | Status: DC

## 2016-10-17 MED ORDER — OSELTAMIVIR PHOSPHATE 75 MG PO CAPS
75.0000 mg | ORAL_CAPSULE | Freq: Once | ORAL | Status: AC
  Administered 2016-10-17: 75 mg via ORAL
  Filled 2016-10-17: qty 1

## 2016-10-17 NOTE — ED Notes (Signed)
Bed: WA17 Expected date:  Expected time:  Means of arrival:  Comments: EMS 

## 2016-10-17 NOTE — ED Triage Notes (Signed)
Patient is complaining of running nose, cough, and sinus pressure. Patient states he feels like it is the flu. The patient walked to bed from EMS bay with a cane and no other assistance. Patient comes from home.

## 2016-10-17 NOTE — Discharge Instructions (Signed)
Please read and follow all provided instructions.  Your diagnoses today include:  1. Influenza A     Tests performed today include: Influenza Vital signs. See below for your results today.   Medications prescribed:   Take any prescribed medications only as directed.  Home care instructions:  Follow any educational materials contained in this packet. Please continue drinking plenty of fluids. Use over-the-counter cold and flu medications as needed as directed on packaging for symptom relief. You may also use tylenol as directed on packaging for pain or fever.   BE VERY CAREFUL not to take multiple medicines containing Tylenol (also called acetaminophen). Doing so can lead to an overdose which can damage your liver and cause liver failure and possibly death.   Follow-up instructions: Please follow-up with your primary care provider in the next 3 days for further evaluation of your symptoms.   Return instructions:  Please return to the Emergency Department if you experience worsening symptoms. Please return if you have a high fever greater than 101 degrees not controlled with over-the-counter medications, persistent vomiting and cannot keep down fluids, or worsening trouble breathing. Please return if you have any other emergent concerns.  Additional Information:  Your vital signs today were: BP 146/58 (BP Location: Left Arm)    Pulse 79    Temp 98.7 F (37.1 C) (Oral)    Resp 20    SpO2 96%  If your blood pressure (BP) was elevated above 135/85 this visit, please have this repeated by your doctor within one month.

## 2016-10-17 NOTE — ED Provider Notes (Signed)
[facility]-EMERGENCY DEPT Provider Note   CSN: [id number] Arrival date & time: 10/17/16  7829     History   Chief Complaint Chief Complaint  Patient presents with  . Generalized Body Aches  . Cough  . Nasal Congestion    HPI [name] is a 66 y.o. male.  HPI  66 y.o. male with a hx of DM, HTN, PVD, Renal Transplant, presents to the Emergency Department today complaining of cough, congestion x 3 days. Notes cough is productive. Pt endorse generalized body aches. Pt is post renal transplant and wishes to be evaluated for pneumonia or flu. Notes subjective fevers at home. No N/V/D. No CP/SOB/ABD pain. No headaches. No dysuria. No other symptoms noted    Past Medical History:  Diagnosis Date  . Anxiety   . CPAP (continuous positive airway pressure) dependence   . Diabetes mellitus without complication (HCC)   . GERD (gastroesophageal reflux disease)   . Hypertension   . Legally blind in left eye, as defined in [place]   . PTSD (post-traumatic stress disorder)   . PVD (peripheral vascular disease) (HCC) 01/17/2015  . Renal disorder   . Sleep apnea     Patient Active Problem List   Diagnosis Date Noted  . 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 S/P BKA (below knee amputation) unilateral (HCC) 02/20/2015  . Ischemic ulcer of toe (HCC)   . Peripheral vascular disease (HCC)   . PVD (peripheral vascular disease) (HCC) 01/17/2015  . Cellulitis and abscess of foot 01/13/2015  . Cellulitis and abscess of foot excluding toe 01/13/2015  . Diabetes mellitus without complication (HCC) 01/13/2015  . Leukocytosis 01/13/2015  . Diabetes (HCC) 12/19/2007  . DYSLIPIDEMIA 12/19/2007  . ANEMIA 12/19/2007  . ANXIETY 12/19/2007  . Essential hypertension 12/19/2007  . TRANSIENT ISCHEMIC ATTACK 12/19/2007  . PERIPHERAL VASCULAR DISEASE 12/19/2007  . RENAL FAILURE 12/19/2007    Past Surgical History:  Procedure Laterality Date  . ABDOMINAL AORTAGRAM N/A  01/21/2015   Procedure: ABDOMINAL Ronny Flurry;  Surgeon: Chuck Hint, MD;  Location: [facility] CATH LAB;  Service: Cardiovascular;  Laterality: N/A;  . AMPUTATION Left 01/23/2015   Procedure: FIRST RAY AMPUTATION/LEFT FOOT;  Surgeon: Nadara Mustard, MD;  Location: [facility] OR;  Service: Orthopedics;  Laterality: Left;  . AMPUTATION Left 02/20/2015   Procedure: AMPUTATION BELOW KNEE;  Surgeon: Nadara Mustard, MD;  Location: [facility] OR;  Service: Orthopedics;  Laterality: Left;  . AV FISTULA PLACEMENT    . COLONOSCOPY    . COMBINED KIDNEY-PANCREAS TRANSPLANT Left 2009  . INCISION AND DRAINAGE ABSCESS Left 01/13/2015   Procedure: INCISION AND DRAINAGE OF LEFT FOOT FIRST RAY ABCESS;  Surgeon: Kathryne Hitch, MD;  Location: [facility] OR;  Service: Orthopedics;  Laterality: Left;  . LOWER EXTREMITY ANGIOGRAM Left 01/21/2015   Procedure: LOWER EXTREMITY ANGIOGRAM;  Surgeon: Chuck Hint, MD;  Location: [facility] CATH LAB;  Service: Cardiovascular;  Laterality: Left;       Home Medications    Prior to Admission medications   Medication Sig Start Date End Date Taking? Authorizing Provider  aspirin EC 325 MG EC tablet Take 1 tablet (325 mg total) by mouth daily. 03/05/15  Yes Evlyn Kanner Love, PA-C  Insulin Detemir (LEVEMIR) 100 UNIT/ML Pen Inject 8 Units into the skin daily at 10 pm. Patient taking differently: Inject 12 Units into the skin daily at 10 pm.  03/05/15  Yes Evlyn Kanner Love, PA-C  mycophenolate (CELLCEPT) 250 MG capsule Take  1,000 mg by mouth 2 (two) times daily.   Yes Historical Provider, MD  tacrolimus (PROGRAF) 1 MG capsule Take 5 mg by mouth 2 (two) times daily.    Yes Historical Provider, MD  acyclovir (ZOVIRAX) 800 MG tablet Take 1 tablet (800 mg total) by mouth 5 (five) times daily. Patient not taking: Reported on 10/17/2016 03/14/15   Mirian MoMatthew Gentry, MD  cephALEXin (KEFLEX) 500 MG capsule Take 1 capsule (500 mg total) by mouth 4 (four) times daily. Patient not taking: Reported on 10/17/2016 03/19/15    Trixie DredgeEmily West, PA-C  gabapentin (NEURONTIN) 300 MG capsule Take 1 capsule (300 mg total) by mouth 2 (two) times daily. Patient not taking: Reported on 10/17/2016 03/05/15   Evlyn KannerPamela S Love, PA-C  HYDROcodone-acetaminophen (NORCO/VICODIN) 5-325 MG tablet Take 1-2 tablets by mouth every 6 (six) hours as needed for moderate pain. Patient not taking: Reported on 10/17/2016 02/01/16   Cathren LaineKevin Steinl, MD  Lancets (FREESTYLE) lancets Use as instructed Patient not taking: Reported on 10/17/2016 01/25/15   Richarda OverlieNayana Abrol, MD  oxyCODONE 10 MG TABS Take 0.5-1 tablets (5-10 mg total) by mouth every 6 (six) hours as needed for moderate pain. Patient not taking: Reported on 10/17/2016 03/05/15   Evlyn KannerPamela S Love, PA-C  oxyCODONE-acetaminophen (PERCOCET/ROXICET) 5-325 MG per tablet Take 1-2 tablets by mouth every 4 (four) hours as needed for severe pain. Patient not taking: Reported on 10/17/2016 03/14/15   Mirian MoMatthew Gentry, MD  pantoprazole (PROTONIX) 40 MG tablet Take 1 tablet (40 mg total) by mouth daily. Patient not taking: Reported on 10/17/2016 01/25/15   Richarda OverlieNayana Abrol, MD    Family History History reviewed. No pertinent family history.  Social History Social History  Substance Use Topics  . Smoking status: Former Games developermoker  . Smokeless tobacco: Never Used     Comment: quit i 1994ish  . Alcohol use Yes     Comment: 1 beer rarely      Allergies   Patient has no known allergies.   Review of Systems Review of Systems ROS reviewed and all are negative for acute change except as noted in the HPI.  Physical Exam Updated Vital Signs BP 146/58 (BP Location: Left Arm)   Pulse 79   Temp 98.7 F (37.1 C) (Oral)   Resp 20   SpO2 96%   Physical Exam  Constitutional: He is oriented to person, place, and time. Vital signs are normal. He appears well-developed and well-nourished. No distress.  HENT:  Head: Normocephalic and atraumatic.  Right Ear: Hearing, tympanic membrane, external ear and ear canal normal.  Left Ear:  Hearing, tympanic membrane, external ear and ear canal normal.  Nose: Nose normal.  Mouth/Throat: Uvula is midline, oropharynx is clear and moist and mucous membranes are normal. No trismus in the jaw. No oropharyngeal exudate, posterior oropharyngeal erythema or tonsillar abscesses.  Eyes: Conjunctivae and EOM are normal. Pupils are equal, round, and reactive to light.  Neck: Normal range of motion. Neck supple. No tracheal deviation present.  Cardiovascular: Normal rate, regular rhythm, S1 normal, S2 normal, normal heart sounds, intact distal pulses and normal pulses.   Pulmonary/Chest: Effort normal and breath sounds normal. No respiratory distress. He has no decreased breath sounds. He has no wheezes. He has no rhonchi. He has no rales. He exhibits no tenderness.  Abdominal: Normal appearance and bowel sounds are normal. There is no tenderness.  Musculoskeletal: Normal range of motion.  Neurological: He is alert and oriented to person, place, and time.  Skin: Skin is  warm and dry.  Psychiatric: He has a normal mood and affect. His speech is normal and behavior is normal. Thought content normal.  Nursing note and vitals reviewed.  ED Treatments / Results  Labs (all labs ordered are listed, but only abnormal results are displayed) Labs Reviewed  INFLUENZA PANEL BY PCR (TYPE A & B) - Abnormal; Notable for the following:       Result Value   Influenza A By PCR POSITIVE (*)    All other components within normal limits  CBC - Abnormal; Notable for the following:    Platelets 112 (*)    All other components within normal limits  COMPREHENSIVE METABOLIC PANEL - Abnormal; Notable for the following:    Sodium 134 (*)    Chloride 100 (*)    Glucose, Bld 315 (*)    All other components within normal limits  URINALYSIS, ROUTINE W REFLEX MICROSCOPIC  I-STAT CG4 LACTIC ACID, ED   EKG  EKG Interpretation None       Radiology Dg Chest 2 View  Result Date: 10/17/2016 CLINICAL DATA:   Cough EXAM: CHEST  2 VIEW COMPARISON:  02/23/2007 FINDINGS: The heart is mildly enlarged. Vascularity is within normal limits. Lungs are under aerated. No obvious consolidation. Tiny left pleural effusion is suspected IMPRESSION: Cardiomegaly without decompensation. Tiny left pleural effusion is suspected. Electronically Signed   By: Jolaine Click M.D.   On: 10/17/2016 07:59    Procedures Procedures (including critical care time)  Medications Ordered in ED Medications - No data to display  Initial Impression / Assessment and Plan / ED Course  I have reviewed the triage vital signs and the nursing notes.  Pertinent labs & imaging results that were available during my care of the patient were reviewed by me and considered in my medical decision making (see chart for details).    Final Clinical Impressions(s) / ED Diagnoses  {I have reviewed and evaluated the relevant laboratory values. {I have reviewed and evaluated the relevant imaging studies.  {I have reviewed the relevant previous healthcare records.  {I obtained HPI from historian.   ED Course:  Assessment: Pt is a 65yM hx DM, HTN, PVD, Renal Transplant presents with URI symptoms x 3 days. Subjective fevers at home . On exam, pt in NAD. VSS. Afebrile. Lungs CTA, Heart RRR. Abdomen nontender/soft. Pt CXR negative for acute infiltrate. CBC unremarkable. BMP unremarkable. Lactic negative. Flu positive for influenza A. Consult with pharmacy. Will do full dose of 75mg  Tamiflu. Pt will be discharged with Tamiflu and close follow up to PCP. Verbalizes understanding and is agreeable with plan. Pt is hemodynamically stable & in NAD prior to dc  Disposition/Plan:  DC Home Additional Verbal discharge instructions given and discussed with patient.  Pt Instructed to f/u with PCP in the next week for evaluation and treatment of symptoms. Return precautions given Pt acknowledges and agrees with plan  Supervising Physician Canary Brim Tegeler,  MD  Final diagnoses:  Influenza A    New Prescriptions New Prescriptions   No medications on file     Audry Pili, PA-C 10/17/16 1136    Canary Brim Tegeler, MD 10/17/16 1941

## 2016-10-17 NOTE — ED Notes (Signed)
Patient transported to X-ray 

## 2016-10-17 NOTE — ED Notes (Signed)
Pt ambulates to BR and back to room w/o difficulty or assistance

## 2016-11-24 DIAGNOSIS — Z8639 Personal history of other endocrine, nutritional and metabolic disease: Secondary | ICD-10-CM | POA: Diagnosis not present

## 2016-11-24 DIAGNOSIS — E113599 Type 2 diabetes mellitus with proliferative diabetic retinopathy without macular edema, unspecified eye: Secondary | ICD-10-CM | POA: Diagnosis not present

## 2016-11-24 DIAGNOSIS — M109 Gout, unspecified: Secondary | ICD-10-CM | POA: Diagnosis not present

## 2016-11-24 DIAGNOSIS — Z940 Kidney transplant status: Secondary | ICD-10-CM | POA: Diagnosis not present

## 2016-11-24 DIAGNOSIS — E559 Vitamin D deficiency, unspecified: Secondary | ICD-10-CM | POA: Diagnosis not present

## 2016-11-24 DIAGNOSIS — Z89519 Acquired absence of unspecified leg below knee: Secondary | ICD-10-CM | POA: Diagnosis not present

## 2016-11-24 DIAGNOSIS — Z6839 Body mass index (BMI) 39.0-39.9, adult: Secondary | ICD-10-CM | POA: Diagnosis not present

## 2016-11-24 DIAGNOSIS — E785 Hyperlipidemia, unspecified: Secondary | ICD-10-CM | POA: Diagnosis not present

## 2016-11-24 DIAGNOSIS — I10 Essential (primary) hypertension: Secondary | ICD-10-CM | POA: Diagnosis not present

## 2017-02-18 ENCOUNTER — Ambulatory Visit (HOSPITAL_COMMUNITY)
Admission: EM | Admit: 2017-02-18 | Discharge: 2017-02-18 | Disposition: A | Discharge status: Home / Self Care | Payer: Medicare Other

## 2017-02-18 ENCOUNTER — Encounter (HOSPITAL_COMMUNITY): Payer: Self-pay | Admitting: Family Medicine

## 2017-02-18 DIAGNOSIS — M79641 Pain in right hand: Secondary | ICD-10-CM | POA: Diagnosis not present

## 2017-02-18 HISTORY — DX: Disorder of thyroid, unspecified: E07.9

## 2017-02-18 NOTE — ED Triage Notes (Signed)
Pt here for 4 weeks of right hand pain. sts that he had an xray done and was told it was fractured.

## 2017-02-18 NOTE — ED Provider Notes (Signed)
CSN: [id number]     Arrival date & time 02/18/17  1653 History   First MD Initiated Contact with Patient 02/18/17 1713     Chief Complaint  Patient presents with  . Hand Pain   (Consider location/radiation/quality/duration/timing/severity/associated sxs/prior Treatment) 66 year old male presents for further evaluation of right hand pain. He fell about 3 weeks ago and landed on the lateral aspect of his right hand. He did not seek treatment right away. He went to the [place] 2 days ago 02/16/17 and had an x-ray done. They called him back today to tell him he had a fracture. They did not specify any additional information regarding the fracture. He was told to come to Urgent Care or to the [facility] for further treatment. He is able to fully flex and extend his hand but pain still along the lateral aspect of the 5th metacarpal. He has not taken anything for pain due to kidney transplant and is currently on 2 medications for immunosuppression.    The history is provided by the patient.    Past Medical History:  Diagnosis Date  . Anxiety   . CPAP (continuous positive airway pressure) dependence   . Diabetes mellitus without complication (HCC)   . GERD (gastroesophageal reflux disease)   . Hypertension   . Legally blind in left eye, as defined in [place]   . PTSD (post-traumatic stress disorder)   . PVD (peripheral vascular disease) (HCC) 01/17/2015  . Renal disorder   . Sleep apnea   . Thyroid disease    Past Surgical History:  Procedure Laterality Date  . ABDOMINAL AORTAGRAM N/A 01/21/2015   Procedure: ABDOMINAL Ronny Flurry;  Surgeon: Chuck Hint, MD;  Location: [facility] CATH LAB;  Service: Cardiovascular;  Laterality: N/A;  . AMPUTATION Left 01/23/2015   Procedure: FIRST RAY AMPUTATION/LEFT FOOT;  Surgeon: Nadara Mustard, MD;  Location: [facility] OR;  Service: Orthopedics;  Laterality: Left;  . AMPUTATION Left 02/20/2015   Procedure: AMPUTATION BELOW KNEE;  Surgeon: Nadara Mustard, MD;  Location: [facility] OR;   Service: Orthopedics;  Laterality: Left;  . AV FISTULA PLACEMENT    . COLONOSCOPY    . COMBINED KIDNEY-PANCREAS TRANSPLANT Left 2009  . INCISION AND DRAINAGE ABSCESS Left 01/13/2015   Procedure: INCISION AND DRAINAGE OF LEFT FOOT FIRST RAY ABCESS;  Surgeon: Kathryne Hitch, MD;  Location: [facility] OR;  Service: Orthopedics;  Laterality: Left;  . LOWER EXTREMITY ANGIOGRAM Left 01/21/2015   Procedure: LOWER EXTREMITY ANGIOGRAM;  Surgeon: Chuck Hint, MD;  Location: [facility] CATH LAB;  Service: Cardiovascular;  Laterality: Left;   History reviewed. No pertinent family history. Social History  Substance Use Topics  . Smoking status: Former Games developer  . Smokeless tobacco: Never Used     Comment: quit i 1994ish  . Alcohol use Yes     Comment: 1 beer rarely     Review of Systems  Constitutional: Negative for chills and fever.  Musculoskeletal: Positive for arthralgias, joint swelling and myalgias.  Skin: Negative for rash and wound.  Allergic/Immunologic: Positive for immunocompromised state.  Neurological: Negative for tremors, weakness, numbness and headaches.    Allergies  Patient has no known allergies.  Home Medications   Prior to Admission medications   Medication Sig Start Date End Date Taking? Authorizing Provider  aspirin EC 325 MG EC tablet Take 1 tablet (325 mg total) by mouth daily. 03/05/15   Love, Evlyn Kanner, PA-C  mycophenolate (CELLCEPT) 250 MG capsule Take 1,000 mg by mouth 2 (two) times  daily.    [provider]  tacrolimus (PROGRAF) 1 MG capsule Take 5 mg by mouth 2 (two) times daily.     [provider]   Meds Ordered and Administered this Visit  Medications - No data to display  BP 138/63 (BP Location: Left Arm)   Pulse 81   Temp 97.6 F (36.4 C) (Oral)   Resp 16   SpO2 100%  No data found.   Physical Exam  Constitutional: He is oriented to person, place, and time. He appears well-developed and well-nourished. No distress.    Musculoskeletal: Normal range of motion. He exhibits tenderness.       Right hand: He exhibits tenderness and swelling. He exhibits normal range of motion, normal capillary refill and no laceration. Normal sensation noted. Normal strength noted.       Hands: Slight swelling and tenderness along the lateral aspect of the 5th metacarpal and into the 5th phalange. No numbness present. Has full range of motion but pain with flexion. No obvious deformity. Good pulses and capillary refill.   Neurological: He is alert and oriented to person, place, and time.  Skin: Skin is warm and dry. Capillary refill takes less than 2 seconds.  Psychiatric: He has a normal mood and affect. His behavior is normal.    Urgent Care Course     Procedures (including critical care time)  Labs Review Labs Reviewed - No data to display  Imaging Review No results found.   Visual Acuity Review  Right Eye Distance:   Left Eye Distance:   Bilateral Distance:    Right Eye Near:   Left Eye Near:    Bilateral Near:         MDM   1. Right hand pain    Discussed with patient that I do not have access to his x-ray at this time. Uncertain what type of fracture he may have and what treatment would be advised. Since no obvious deformity and injury occurred about 3 weeks ago, applied an ace wrap to his right hand/wrist. [facility] on [facility] or providing a referral to the Orthopedic.      Sudie GrumblingAmyot, Arti Trang Berry, NP 02/18/17 [phone]

## 2017-02-18 NOTE — Discharge Instructions (Signed)
We provided an ace wrap to help support your hand. [facility] in [facility] for further information and referral to Orthopedic if needed for hand fracture.

## 2017-02-27 ENCOUNTER — Emergency Department (HOSPITAL_COMMUNITY)
Admission: EM | Admit: 2017-02-27 | Discharge: 2017-02-28 | Discharge status: Against Medical Advice | Payer: Medicare Other | Source: Home / Self Care

## 2017-02-28 ENCOUNTER — Emergency Department (HOSPITAL_COMMUNITY)
Admission: EM | Admit: 2017-02-28 | Discharge: 2017-02-28 | Disposition: A | Discharge status: Home / Self Care | Payer: Medicare Other | Attending: Emergency Medicine | Admitting: Emergency Medicine

## 2017-02-28 ENCOUNTER — Encounter (HOSPITAL_COMMUNITY): Payer: Self-pay

## 2017-02-28 ENCOUNTER — Emergency Department (HOSPITAL_COMMUNITY): Payer: Medicare Other

## 2017-02-28 DIAGNOSIS — S62326G Displaced fracture of shaft of fifth metacarpal bone, right hand, subsequent encounter for fracture with delayed healing: Secondary | ICD-10-CM | POA: Insufficient documentation

## 2017-02-28 DIAGNOSIS — S62326A Displaced fracture of shaft of fifth metacarpal bone, right hand, initial encounter for closed fracture: Secondary | ICD-10-CM

## 2017-02-28 DIAGNOSIS — Y998 Other external cause status: Secondary | ICD-10-CM | POA: Insufficient documentation

## 2017-02-28 DIAGNOSIS — Y929 Unspecified place or not applicable: Secondary | ICD-10-CM | POA: Diagnosis not present

## 2017-02-28 DIAGNOSIS — Y939 Activity, unspecified: Secondary | ICD-10-CM | POA: Diagnosis not present

## 2017-02-28 DIAGNOSIS — X58XXXA Exposure to other specified factors, initial encounter: Secondary | ICD-10-CM | POA: Diagnosis not present

## 2017-02-28 DIAGNOSIS — M79641 Pain in right hand: Secondary | ICD-10-CM | POA: Diagnosis present

## 2017-02-28 DIAGNOSIS — S62316A Displaced fracture of base of fifth metacarpal bone, right hand, initial encounter for closed fracture: Secondary | ICD-10-CM | POA: Diagnosis not present

## 2017-02-28 NOTE — ED Triage Notes (Signed)
Per Pt, PT was diagnosed with a hand fracture four weeks at the [facility]. Pt would like it to be treated today. Pt has blister noted to his right greater toe. No edema or erythema noted.

## 2017-02-28 NOTE — Discharge Instructions (Signed)
Please read instructions below. Schedule an appointment with the hand specialist in the next few days to follow-up on your injury. Return to the ER for new or concerning symptoms.

## 2017-02-28 NOTE — ED Provider Notes (Signed)
[facility]-EM[facility]Y DEPT Provider Note   CSN: [id number] Arrival date & time: 02/28/17  0701  By signing my name below, I, Rosana Fretana Waskiewicz, attest that this documentation has been prepared under the direction and in the presence of non-physician practitioner, Russo, [place], PA-C. Electronically Signed: Rosana Fretana Waskiewicz, ED Scribe. 02/28/17. 9:38 AM.  History   Chief Complaint Chief Complaint  Patient presents with  . Blister   The history is provided by the patient. No language interpreter was used.   HPI Comments: [name] is a 66 y.o. male with a PMHx of DM and other comorbidities, who presents to the Emergency Department complaining of a moderate, gradually worsening area of pain and swelling to bottom of the 1st toe on the right foot onset 3 days ago. Per pt, he has diabetic neuropathy and has lost his left foot. Pt states pain is exacerbated with palpation and direct pressure. He denies drainage from the area.    Pt has a second complaint of moderate right hand pain onset over a month ago. Pt states he was diagnosed at the [facility] but has not had his hand splinted. Pt has tried no treatments prior to arrival in the ED. Pt denies weakness, N/T, or any other complaints at this time.   Past Medical History:  Diagnosis Date  . Anxiety   . CPAP (continuous positive airway pressure) dependence   . Diabetes mellitus without complication (HCC)   . GERD (gastroesophageal reflux disease)   . Hypertension   . Legally blind in left eye, as defined in [place]   . PTSD (post-traumatic stress disorder)   . PVD (peripheral vascular disease) (HCC) 01/17/2015  . Renal disorder   . Sleep apnea   . Thyroid disease     Patient Active Problem List   Diagnosis Date Noted  . 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 S/P BKA (below knee amputation) unilateral (HCC) 02/20/2015  . Ischemic ulcer of toe (HCC)   . Peripheral vascular disease (HCC)   . PVD (peripheral  vascular disease) (HCC) 01/17/2015  . Cellulitis and abscess of foot 01/13/2015  . Cellulitis and abscess of foot excluding toe 01/13/2015  . Diabetes mellitus without complication (HCC) 01/13/2015  . Leukocytosis 01/13/2015  . Diabetes (HCC) 12/19/2007  . DYSLIPIDEMIA 12/19/2007  . ANEMIA 12/19/2007  . ANXIETY 12/19/2007  . Essential hypertension 12/19/2007  . TRANSIENT ISCHEMIC ATTACK 12/19/2007  . PERIPHERAL VASCULAR DISEASE 12/19/2007  . RENAL FAILURE 12/19/2007    Past Surgical History:  Procedure Laterality Date  . ABDOMINAL AORTAGRAM N/A 01/21/2015   Procedure: ABDOMINAL Ronny FlurryAORTAGRAM;  Surgeon: Chuck Hinthristopher S Dickson, MD;  [facility];  Service: Cardiovascular;  Laterality: N/A;  . AMPUTATION Left 01/23/2015   Procedure: FIRST RAY AMPUTATION/LEFT FOOT;  Surgeon: Nadara MustardMarcus Duda V, MD;  Location: [facility] OR;  Service: Orthopedics;  Laterality: Left;  . AMPUTATION Left 02/20/2015   Procedure: AMPUTATION BELOW KNEE;  Surgeon: Nadara MustardMarcus Duda V, MD;  Location: [facility] OR;  Service: Orthopedics;  Laterality: Left;  . AV FISTULA PLACEMENT    . COLONOSCOPY    . COMBINED KIDNEY-PANCREAS TRANSPLANT Left 2009  . INCISION AND DRAINAGE ABSCESS Left 01/13/2015   Procedure: INCISION AND DRAINAGE OF LEFT FOOT FIRST RAY ABCESS;  Surgeon: Kathryne Hitchhristopher Y Blackman, MD;  Location: [facility] OR;  Service: Orthopedics;  Laterality: Left;  . LOWER EXTREMITY ANGIOGRAM Left 01/21/2015   Procedure: LOWER EXTREMITY ANGIOGRAM;  Surgeon: Chuck Hinthristopher S Dickson, MD;  [facility];  Service: Cardiovascular;  Laterality: Left;       Home Medications    Prior to Admission medications   Medication Sig Start Date End Date Taking? Authorizing Provider  aspirin EC 325 MG EC tablet Take 1 tablet (325 mg total) by mouth daily. 03/05/15   Love, Evlyn Kanner, PA-C  mycophenolate (CELLCEPT) 250 MG capsule Take 1,000 mg by mouth 2 (two) times daily.    [provider]  tacrolimus (PROGRAF) 1 MG capsule Take 5 mg by mouth 2  (two) times daily.     [provider]    Family History History reviewed. No pertinent family history.  Social History Social History  Substance Use Topics  . Smoking status: Former Games developer  . Smokeless tobacco: Never Used     Comment: quit i 1994ish  . Alcohol use Yes     Comment: 1 beer rarely      Allergies   Patient has no known allergies.   Review of Systems Review of Systems  Constitutional: Negative for fever.  Musculoskeletal: Positive for arthralgias and myalgias.  Skin: Positive for wound.  Neurological: Negative for weakness and numbness.     Physical Exam Updated Vital Signs BP (!) 134/58 (BP Location: Left Arm)   Pulse 80   Temp 97.4 F (36.3 C) (Oral)   Resp 14   Ht 5\' 11"  (1.803 m)   Wt 81.6 kg (180 lb)   SpO2 97%   BMI 25.10 kg/m   Physical Exam  Constitutional: He is oriented to person, place, and time. He appears well-developed and well-nourished.  HENT:  Head: Normocephalic and atraumatic.  Eyes: Conjunctivae are normal.  Cardiovascular: Normal rate.   Pulmonary/Chest: Effort normal.  Musculoskeletal: Normal range of motion. He exhibits edema and tenderness. He exhibits no deformity.  Right hand: Full active ROM of the right wrist, hand and fingers. Mild edema over the distal 5th metacarpal. No gross deformity. Mild TTP over 5th metacarpal. No snuffbox tenderness. Intact distal pulses.  Neurological: He is alert and oriented to person, place, and time. No sensory deficit.  Sensation is intact. Equal grip strength in bilateral hands.   Skin: Skin is warm and dry. No rash noted.  Right foot with dry skin. No color changes. No blisters, rashes, or ulcers. Intact distal pulses. Sensation is intact. Normal foot.   Psychiatric: He has a normal mood and affect. His behavior is normal.  Nursing note and vitals reviewed.    ED Treatments / Results  DIAGNOSTIC STUDIES: Oxygen Saturation is 97% on RA, normal by my interpretation.    COORDINATION OF CARE: 9:27 AM-Discussed next steps with pt including continuing to let his hand heal. Pt verbalized understanding and is agreeable with the plan.   Labs (all labs ordered are listed, but only abnormal results are displayed) Labs Reviewed - No data to display  EKG  EKG Interpretation None       Radiology Dg Hand Complete Right  Result Date: 02/28/2017 CLINICAL DATA:  Patient fell 1-2 weeks ago with persistent hand pain. EXAM: RIGHT HAND - COMPLETE 3+ VIEW COMPARISON:  None. FINDINGS: Three views study shows a comminuted fracture of the distal fifth metacarpal without substantial angulation. There is some for shortening at the fracture site. No other acute fracture identified. Degenerative changes are noted in the ulnar aspect of the wrist. Vascular calcifications suggest diabetes. IMPRESSION: Comminuted fracture distal fifth metacarpal. Electronically Signed   By: Kennith Center M.D.   On: 02/28/2017 07:48    Procedures Procedures (including critical care  time)  Medications Ordered in ED Medications - No data to display   Initial Impression / Assessment and Plan / ED Course  I have reviewed the triage vital signs and the nursing notes.  Pertinent labs & imaging results that were available during my care of the patient were reviewed by me and considered in my medical decision making (see chart for details).     Pt w 30 month-old hand injury. X-Ray with comminuted R 5th distal metacarpal fracture, without significant angulation. Nl strength, NV intact, nl ROM. As this is an old fracture, hand was not splinted today. Pt also with concern for right foot ulcer, exam with normal foot, no skin color change or ulcers. Pt advised to follow up with hand specialist for further evaluation of hand and PCP for follow up on right foot pain.  Conservative therapy recommended and discussed. Pt safe for discharge.  Patient discussed with  Dr. Estell Harpin.  Discussed results, findings,  treatment and follow up. Patient advised of return precautions. Patient verbalized understanding and agreed with plan.     Final Clinical Impressions(s) / ED Diagnoses   Final diagnoses:  Closed displaced fracture of shaft of fifth metacarpal bone of right hand, initial encounter    New Prescriptions Discharge Medication List as of 02/28/2017  9:46 AM     I personally performed the services described in this documentation, which was scribed in my presence. The recorded information has been reviewed and is accurate.     Russo, [place] N, PA-C 02/28/17 1035    Bethann Berkshire, MD 02/28/17 [phone]

## 2017-07-05 ENCOUNTER — Encounter: Payer: Self-pay | Admitting: Gastroenterology

## 2017-07-12 DIAGNOSIS — M109 Gout, unspecified: Secondary | ICD-10-CM | POA: Diagnosis not present

## 2017-07-12 DIAGNOSIS — Z23 Encounter for immunization: Secondary | ICD-10-CM | POA: Diagnosis not present

## 2017-07-12 DIAGNOSIS — E113599 Type 2 diabetes mellitus with proliferative diabetic retinopathy without macular edema, unspecified eye: Secondary | ICD-10-CM | POA: Diagnosis not present

## 2017-07-12 DIAGNOSIS — Z6839 Body mass index (BMI) 39.0-39.9, adult: Secondary | ICD-10-CM | POA: Diagnosis not present

## 2017-07-12 DIAGNOSIS — E559 Vitamin D deficiency, unspecified: Secondary | ICD-10-CM | POA: Diagnosis not present

## 2017-07-12 DIAGNOSIS — Z8639 Personal history of other endocrine, nutritional and metabolic disease: Secondary | ICD-10-CM | POA: Diagnosis not present

## 2017-07-12 DIAGNOSIS — E785 Hyperlipidemia, unspecified: Secondary | ICD-10-CM | POA: Diagnosis not present

## 2017-07-12 DIAGNOSIS — I10 Essential (primary) hypertension: Secondary | ICD-10-CM | POA: Diagnosis not present

## 2017-07-12 DIAGNOSIS — D559 Anemia due to enzyme disorder, unspecified: Secondary | ICD-10-CM | POA: Diagnosis not present

## 2017-07-12 DIAGNOSIS — Z89519 Acquired absence of unspecified leg below knee: Secondary | ICD-10-CM | POA: Diagnosis not present

## 2017-07-12 DIAGNOSIS — Z940 Kidney transplant status: Secondary | ICD-10-CM | POA: Diagnosis not present

## 2017-07-23 DIAGNOSIS — E108 Type 1 diabetes mellitus with unspecified complications: Secondary | ICD-10-CM | POA: Diagnosis not present

## 2017-07-23 DIAGNOSIS — Z7982 Long term (current) use of aspirin: Secondary | ICD-10-CM

## 2017-07-23 DIAGNOSIS — E1165 Type 2 diabetes mellitus with hyperglycemia: Secondary | ICD-10-CM | POA: Diagnosis not present

## 2017-07-23 DIAGNOSIS — F419 Anxiety disorder, unspecified: Secondary | ICD-10-CM | POA: Diagnosis present

## 2017-07-23 DIAGNOSIS — K920 Hematemesis: Secondary | ICD-10-CM | POA: Diagnosis not present

## 2017-07-23 DIAGNOSIS — I10 Essential (primary) hypertension: Secondary | ICD-10-CM | POA: Diagnosis present

## 2017-07-23 DIAGNOSIS — H548 Legal blindness, as defined in USA: Secondary | ICD-10-CM | POA: Diagnosis present

## 2017-07-23 DIAGNOSIS — I160 Hypertensive urgency: Secondary | ICD-10-CM | POA: Diagnosis not present

## 2017-07-23 DIAGNOSIS — E1151 Type 2 diabetes mellitus with diabetic peripheral angiopathy without gangrene: Secondary | ICD-10-CM | POA: Diagnosis not present

## 2017-07-23 DIAGNOSIS — G4730 Sleep apnea, unspecified: Secondary | ICD-10-CM | POA: Diagnosis present

## 2017-07-23 DIAGNOSIS — K226 Gastro-esophageal laceration-hemorrhage syndrome: Secondary | ICD-10-CM | POA: Diagnosis not present

## 2017-07-23 DIAGNOSIS — Z87891 Personal history of nicotine dependence: Secondary | ICD-10-CM

## 2017-07-23 DIAGNOSIS — R739 Hyperglycemia, unspecified: Secondary | ICD-10-CM | POA: Diagnosis not present

## 2017-07-23 DIAGNOSIS — Z940 Kidney transplant status: Secondary | ICD-10-CM | POA: Diagnosis not present

## 2017-07-23 DIAGNOSIS — F4310 Post-traumatic stress disorder, unspecified: Secondary | ICD-10-CM | POA: Diagnosis present

## 2017-07-23 DIAGNOSIS — Z89512 Acquired absence of left leg below knee: Secondary | ICD-10-CM

## 2017-07-23 DIAGNOSIS — Z9483 Pancreas transplant status: Secondary | ICD-10-CM

## 2017-07-23 DIAGNOSIS — E8770 Fluid overload, unspecified: Secondary | ICD-10-CM | POA: Diagnosis present

## 2017-07-23 DIAGNOSIS — K219 Gastro-esophageal reflux disease without esophagitis: Secondary | ICD-10-CM | POA: Diagnosis present

## 2017-07-23 DIAGNOSIS — Z9119 Patient's noncompliance with other medical treatment and regimen: Secondary | ICD-10-CM

## 2017-07-23 DIAGNOSIS — Z79899 Other long term (current) drug therapy: Secondary | ICD-10-CM

## 2017-07-23 DIAGNOSIS — R110 Nausea: Secondary | ICD-10-CM | POA: Diagnosis not present

## 2017-07-24 ENCOUNTER — Inpatient Hospital Stay (HOSPITAL_COMMUNITY)
Admission: EM | Admit: 2017-07-24 | Discharge: 2017-07-28 | DRG: 369 | Disposition: A | Discharge status: Home / Self Care | Payer: Medicare Other | Attending: Internal Medicine | Admitting: Internal Medicine

## 2017-07-24 ENCOUNTER — Encounter (HOSPITAL_COMMUNITY): Payer: Self-pay | Admitting: Nurse Practitioner

## 2017-07-24 ENCOUNTER — Observation Stay (HOSPITAL_COMMUNITY): Payer: Medicare Other

## 2017-07-24 DIAGNOSIS — K920 Hematemesis: Secondary | ICD-10-CM | POA: Diagnosis present

## 2017-07-24 DIAGNOSIS — R112 Nausea with vomiting, unspecified: Secondary | ICD-10-CM

## 2017-07-24 DIAGNOSIS — I739 Peripheral vascular disease, unspecified: Secondary | ICD-10-CM | POA: Diagnosis present

## 2017-07-24 DIAGNOSIS — K922 Gastrointestinal hemorrhage, unspecified: Secondary | ICD-10-CM | POA: Diagnosis present

## 2017-07-24 DIAGNOSIS — I160 Hypertensive urgency: Secondary | ICD-10-CM | POA: Diagnosis not present

## 2017-07-24 DIAGNOSIS — R739 Hyperglycemia, unspecified: Secondary | ICD-10-CM

## 2017-07-24 DIAGNOSIS — E119 Type 2 diabetes mellitus without complications: Secondary | ICD-10-CM

## 2017-07-24 DIAGNOSIS — Z89519 Acquired absence of unspecified leg below knee: Secondary | ICD-10-CM

## 2017-07-24 LAB — CBC
HCT: 47.6 % (ref 39.0–52.0)
HCT: 48.8 % (ref 39.0–52.0)
HCT: 46.8 % (ref 39.0–52.0)
HCT: 45.1 % (ref 39.0–52.0)
Hemoglobin: 16 g/dL (ref 13.0–17.0)
Hemoglobin: 16.7 g/dL (ref 13.0–17.0)
Hemoglobin: 16 g/dL (ref 13.0–17.0)
Hemoglobin: 15.9 g/dL (ref 13.0–17.0)
MCH: 27.1 pg (ref 26.0–34.0)
MCH: 27.2 pg (ref 26.0–34.0)
MCH: 27.2 pg (ref 26.0–34.0)
MCH: 27.6 pg (ref 26.0–34.0)
MCHC: 33.6 g/dL (ref 30.0–36.0)
MCHC: 34.2 g/dL (ref 30.0–36.0)
MCHC: 34.2 g/dL (ref 30.0–36.0)
MCHC: 35.3 g/dL (ref 30.0–36.0)
MCV: 80.5 fL (ref 78.0–100.0)
MCV: 79.6 fL (ref 78.0–100.0)
MCV: 79.5 fL (ref 78.0–100.0)
MCV: 78.3 fL (ref 78.0–100.0)
Platelets: 158 10*3/uL (ref 150–400)
Platelets: 140 10*3/uL — ABNORMAL LOW (ref 150–400)
Platelets: 171 10*3/uL (ref 150–400)
Platelets: 153 10*3/uL (ref 150–400)
RBC: 5.91 MIL/uL — ABNORMAL HIGH (ref 4.22–5.81)
RBC: 6.13 MIL/uL — ABNORMAL HIGH (ref 4.22–5.81)
RBC: 5.89 MIL/uL — ABNORMAL HIGH (ref 4.22–5.81)
RBC: 5.76 MIL/uL (ref 4.22–5.81)
RDW: 13.3 % (ref 11.5–15.5)
RDW: 13.6 % (ref 11.5–15.5)
RDW: 13.5 % (ref 11.5–15.5)
RDW: 13.6 % (ref 11.5–15.5)
WBC: 11.5 10*3/uL — ABNORMAL HIGH (ref 4.0–10.5)
WBC: 15.7 10*3/uL — ABNORMAL HIGH (ref 4.0–10.5)
WBC: 15.1 10*3/uL — ABNORMAL HIGH (ref 4.0–10.5)
WBC: 15.3 10*3/uL — ABNORMAL HIGH (ref 4.0–10.5)

## 2017-07-24 LAB — HEMOGLOBIN A1C
Hgb A1c MFr Bld: 10.4 % — ABNORMAL HIGH (ref 4.8–5.6)
Mean Plasma Glucose: 251.78 mg/dL

## 2017-07-24 LAB — COMPREHENSIVE METABOLIC PANEL
ALT: 18 U/L (ref 17–63)
AST: 22 U/L (ref 15–41)
Albumin: 5.7 g/dL — ABNORMAL HIGH (ref 3.5–5.0)
Alkaline Phosphatase: 130 U/L — ABNORMAL HIGH (ref 38–126)
Anion gap: 21 — ABNORMAL HIGH (ref 5–15)
BUN: 16 mg/dL (ref 6–20)
CO2: 21 mmol/L — ABNORMAL LOW (ref 22–32)
Calcium: 10.7 mg/dL — ABNORMAL HIGH (ref 8.9–10.3)
Chloride: 100 mmol/L — ABNORMAL LOW (ref 101–111)
Creatinine, Ser: 0.93 mg/dL (ref 0.61–1.24)
GFR calc Af Amer: >60 mL/min (ref >60)
GFR calc non Af Amer: >60 mL/min (ref >60)
Glucose, Bld: 377 mg/dL — ABNORMAL HIGH (ref 65–99)
Potassium: 3.7 mmol/L (ref 3.5–5.1)
Sodium: 142 mmol/L (ref 135–145)
Total Bilirubin: 0.9 mg/dL (ref 0.3–1.2)
Total Protein: 9.6 g/dL — ABNORMAL HIGH (ref 6.5–8.1)

## 2017-07-24 LAB — GLUCOSE, CAPILLARY
Glucose-Capillary: 268 mg/dL — ABNORMAL HIGH (ref 65–99)
Glucose-Capillary: 266 mg/dL — ABNORMAL HIGH (ref 65–99)
Glucose-Capillary: 266 mg/dL — ABNORMAL HIGH (ref 65–99)
Glucose-Capillary: 147 mg/dL — ABNORMAL HIGH (ref 65–99)
Glucose-Capillary: 155 mg/dL — ABNORMAL HIGH (ref 65–99)

## 2017-07-24 LAB — MRSA PCR SCREENING: MRSA by PCR: NEGATIVE

## 2017-07-24 LAB — CBG MONITORING, ED
Glucose-Capillary: 355 mg/dL — ABNORMAL HIGH (ref 65–99)
Glucose-Capillary: 357 mg/dL — ABNORMAL HIGH (ref 65–99)

## 2017-07-24 LAB — URINALYSIS, ROUTINE W REFLEX MICROSCOPIC
Bilirubin Urine: NEGATIVE
Glucose, UA: >=500 mg/dL — AB
Ketones, ur: 80 mg/dL — AB
Leukocytes, UA: NEGATIVE
Nitrite: NEGATIVE
Protein, ur: >=300 mg/dL — AB
Specific Gravity, Urine: 1.027 (ref 1.005–1.030)
Squamous Epithelial / LPF: NONE SEEN
pH: 5 (ref 5.0–8.0)

## 2017-07-24 LAB — LACTIC ACID, PLASMA
Lactic Acid, Venous: 3.5 mmol/L (ref 0.5–1.9)
Lactic Acid, Venous: 3.4 mmol/L (ref 0.5–1.9)

## 2017-07-24 LAB — BASIC METABOLIC PANEL
Anion gap: 18 — ABNORMAL HIGH (ref 5–15)
BUN: 14 mg/dL (ref 6–20)
CO2: 21 mmol/L — ABNORMAL LOW (ref 22–32)
Calcium: 9.9 mg/dL (ref 8.9–10.3)
Chloride: 104 mmol/L (ref 101–111)
Creatinine, Ser: 0.83 mg/dL (ref 0.61–1.24)
GFR calc Af Amer: >60 mL/min (ref >60)
GFR calc non Af Amer: >60 mL/min (ref >60)
Glucose, Bld: 315 mg/dL — ABNORMAL HIGH (ref 65–99)
Potassium: 4 mmol/L (ref 3.5–5.1)
Sodium: 143 mmol/L (ref 135–145)

## 2017-07-24 LAB — ABO/RH: ABO/RH(D): O POS

## 2017-07-24 LAB — TYPE AND SCREEN
ABO/RH(D): O POS
Antibody Screen: NEGATIVE

## 2017-07-24 LAB — TROPONIN I: Troponin I: 0.05 ng/mL (ref <0.03)

## 2017-07-24 LAB — OCCULT BLOOD GASTRIC / DUODENUM (SPECIMEN CUP)
Occult Blood, Gastric: POSITIVE — AB
pH, Gastric: 3

## 2017-07-24 MED ORDER — PANTOPRAZOLE SODIUM 40 MG IV SOLR
40.0000 mg | Freq: Two times a day (BID) | INTRAVENOUS | Status: DC
Start: 2017-07-27 — End: 2017-07-25

## 2017-07-24 MED ORDER — SODIUM CHLORIDE 0.9 % IV SOLN
80.0000 mg | Freq: Once | INTRAVENOUS | Status: AC
  Administered 2017-07-24: 07:00:00 80 mg via INTRAVENOUS
  Filled 2017-07-24: qty 80

## 2017-07-24 MED ORDER — TACROLIMUS 1 MG PO CAPS
5.0000 mg | ORAL_CAPSULE | Freq: Two times a day (BID) | ORAL | Status: DC
  Administered 2017-07-24 – 2017-07-28 (×8): 5 mg via ORAL
  Filled 2017-07-24 (×8): qty 5

## 2017-07-24 MED ORDER — LABETALOL HCL 5 MG/ML IV SOLN
10.0000 mg | INTRAVENOUS | Status: DC | PRN
  Administered 2017-07-24 – 2017-07-26 (×9): 10 mg via INTRAVENOUS
  Filled 2017-07-24 (×10): qty 4

## 2017-07-24 MED ORDER — SODIUM CHLORIDE 0.9 % IV BOLUS (SEPSIS)
500.0000 mL | Freq: Once | INTRAVENOUS | Status: AC
  Administered 2017-07-24: 500 mL via INTRAVENOUS

## 2017-07-24 MED ORDER — HYDRALAZINE HCL 20 MG/ML IJ SOLN
10.0000 mg | INTRAMUSCULAR | Status: DC | PRN
Start: 2017-07-24 — End: 2017-07-24
  Administered 2017-07-24 (×2): 10 mg via INTRAVENOUS
  Filled 2017-07-24 (×2): qty 1

## 2017-07-24 MED ORDER — ACETAMINOPHEN 650 MG RE SUPP: 650.0000 mg | Freq: Four times a day (QID) | RECTAL | Status: DC | PRN

## 2017-07-24 MED ORDER — SODIUM CHLORIDE 0.9 % IV BOLUS (SEPSIS)
1000.0000 mL | Freq: Once | INTRAVENOUS | Status: AC
  Administered 2017-07-24: 1000 mL via INTRAVENOUS

## 2017-07-24 MED ORDER — ONDANSETRON HCL 4 MG PO TABS
4.0000 mg | ORAL_TABLET | Freq: Four times a day (QID) | ORAL | Status: DC | PRN
  Filled 2017-07-24: qty 1

## 2017-07-24 MED ORDER — INSULIN ASPART 100 UNIT/ML IV SOLN: 10.0000 [IU] | Freq: Once | INTRAVENOUS | Status: DC

## 2017-07-24 MED ORDER — INSULIN ASPART 100 UNIT/ML ~~LOC~~ SOLN
0.0000 [IU] | SUBCUTANEOUS | Status: DC
  Administered 2017-07-24: 5 [IU] via SUBCUTANEOUS
  Administered 2017-07-24: 1 [IU] via SUBCUTANEOUS
  Administered 2017-07-24: 2 [IU] via SUBCUTANEOUS
  Administered 2017-07-24 (×2): 5 [IU] via SUBCUTANEOUS
  Administered 2017-07-25: 2 [IU] via SUBCUTANEOUS
  Administered 2017-07-25: 3 [IU] via SUBCUTANEOUS
  Administered 2017-07-25: 2 [IU] via SUBCUTANEOUS
  Administered 2017-07-25 (×2): 3 [IU] via SUBCUTANEOUS
  Administered 2017-07-26: 2 [IU] via SUBCUTANEOUS
  Administered 2017-07-26: 1 [IU] via SUBCUTANEOUS

## 2017-07-24 MED ORDER — ONDANSETRON HCL 4 MG/2ML IJ SOLN
4.0000 mg | Freq: Once | INTRAMUSCULAR | Status: AC
  Administered 2017-07-24: 4 mg via INTRAVENOUS
  Filled 2017-07-24: qty 2

## 2017-07-24 MED ORDER — LABETALOL HCL 5 MG/ML IV SOLN
10.0000 mg | INTRAVENOUS | Status: DC | PRN
  Administered 2017-07-24: 10 mg via INTRAVENOUS
  Filled 2017-07-24: qty 4

## 2017-07-24 MED ORDER — SODIUM CHLORIDE 0.9 % IV SOLN
8.0000 mg/h | INTRAVENOUS | Status: DC
  Administered 2017-07-24 – 2017-07-25 (×3): 8 mg/h via INTRAVENOUS
  Filled 2017-07-24 (×5): qty 80

## 2017-07-24 MED ORDER — ONDANSETRON HCL 4 MG/2ML IJ SOLN
4.0000 mg | Freq: Four times a day (QID) | INTRAMUSCULAR | Status: DC | PRN
  Administered 2017-07-24 – 2017-07-25 (×5): 4 mg via INTRAVENOUS
  Filled 2017-07-24 (×6): qty 2

## 2017-07-24 MED ORDER — ONDANSETRON HCL 4 MG/2ML IJ SOLN
4.0000 mg | Freq: Once | INTRAMUSCULAR | Status: AC | PRN
  Administered 2017-07-24: 4 mg via INTRAVENOUS
  Filled 2017-07-24: qty 2

## 2017-07-24 MED ORDER — ACETAMINOPHEN 325 MG PO TABS
650.0000 mg | ORAL_TABLET | Freq: Four times a day (QID) | ORAL | Status: DC | PRN
  Filled 2017-07-24: qty 2

## 2017-07-24 MED ORDER — MYCOPHENOLATE MOFETIL 250 MG PO CAPS
1000.0000 mg | ORAL_CAPSULE | Freq: Two times a day (BID) | ORAL | Status: DC
  Administered 2017-07-24 – 2017-07-28 (×8): 1000 mg via ORAL
  Filled 2017-07-24 (×8): qty 4

## 2017-07-24 MED ORDER — INSULIN ASPART 100 UNIT/ML ~~LOC~~ SOLN
10.0000 [IU] | Freq: Once | SUBCUTANEOUS | Status: AC
  Administered 2017-07-24: 10 [IU] via SUBCUTANEOUS
  Filled 2017-07-24: qty 1

## 2017-07-24 MED ORDER — SODIUM CHLORIDE 0.9 % IV SOLN: INTRAVENOUS | Status: DC

## 2017-07-24 NOTE — Progress Notes (Signed)
Toniann FailKakrakandy, MD paged due to pts continued elevated BP, currently 212/83 and HR in 130's, lactic acid up to 3.5. Awaiting return call and/or new orders. Will continue to monitor.

## 2017-07-24 NOTE — H&P (Signed)
History and Physical    [name] ZOX:[id number] DOB: March 11, 1951 DOA: 07/24/2017  PCP: Patient, No Pcp Per  Patient coming from: Home.  Chief Complaint: Hematemesis.  Patient is not very forthcoming with history and part of the history was provided by patient's wife.  HPI: [name] DikeClarence Abbs is a 66 y.o. male with history of diabetes mellitus type 2, hypertension, peripheral vascular disease, status post renal and pancreatic transplant was brought to the ER the patient had persistent nausea vomiting with hematemesis.  Patient wife states that she had called the patient around 3 PM yesterday and patient was having nausea vomiting this morning.  Patient's wife went to check on him later in the evening around 11 PM patient was still vomiting with blood on the floor.  Patient was brought to the ER.  As per the patient patient has been having some nausea vomiting last couple of days.  Denies any diarrhea abdominal pain.  Denies taking any NSAIDs.  Is on aspirin.  ED Course: In the ER hemoglobin is around 16 patient is hypertensive and tachycardic.  Patient was started on Protonix infusion and admitted for acute GI bleed.  Patient blood pressure was also elevated more than 200 with blood sugars also elevated.  Patient was placed on as needed IV hydralazine.  Review of Systems: As per HPI, rest all negative.   Past Medical History:  Diagnosis Date  . Anxiety   . CPAP (continuous positive airway pressure) dependence   . Diabetes mellitus without complication (HCC)   . GERD (gastroesophageal reflux disease)   . Hypertension   . Legally blind in left eye, [place] in [place]   . PTSD (post-traumatic stress disorder)   . PVD (peripheral vascular disease) (HCC) 01/17/2015  . Renal disorder   . Sleep apnea   . Thyroid disease     Past Surgical History:  Procedure Laterality Date  . ABDOMINAL AORTAGRAM N/A 01/21/2015   Procedure: ABDOMINAL Ronny FlurryAORTAGRAM;  Surgeon: Chuck Hinthristopher S Dickson, MD;   [facility];  Service: Cardiovascular;  Laterality: N/A;  . AMPUTATION Left 01/23/2015   Procedure: FIRST RAY AMPUTATION/LEFT FOOT;  Surgeon: Nadara MustardMarcus Duda V, MD;  Location: [facility] OR;  Service: Orthopedics;  Laterality: Left;  . AMPUTATION Left 02/20/2015   Procedure: AMPUTATION BELOW KNEE;  Surgeon: Nadara MustardMarcus Duda V, MD;  Location: [facility] OR;  Service: Orthopedics;  Laterality: Left;  . AV FISTULA PLACEMENT    . COLONOSCOPY    . COMBINED KIDNEY-PANCREAS TRANSPLANT Left 2009  . INCISION AND DRAINAGE ABSCESS Left 01/13/2015   Procedure: INCISION AND DRAINAGE OF LEFT FOOT FIRST RAY ABCESS;  Surgeon: Kathryne Hitchhristopher Y Blackman, MD;  Location: [facility] OR;  Service: Orthopedics;  Laterality: Left;  . LOWER EXTREMITY ANGIOGRAM Left 01/21/2015   Procedure: LOWER EXTREMITY ANGIOGRAM;  Surgeon: Chuck Hinthristopher S Dickson, MD;  [facility];  Service: Cardiovascular;  Laterality: Left;     reports that he has quit smoking. He has never used smokeless tobacco. He reports that he drinks alcohol. He reports that he does not use drugs.  No Known Allergies  Family History  Problem Relation Age of Onset  . Hypertension Other     Prior to Admission medications   Medication Sig Start Date End Date Taking? Authorizing Provider  mycophenolate (CELLCEPT) 250 MG capsule Take 1,000 mg by mouth 2 (two) times daily.   Yes [provider]  aspirin EC 325 MG EC tablet Take 1 tablet (325 mg total) by mouth daily. 03/05/15   Love,  Evlyn Kanner, PA-C  tacrolimus (PROGRAF) 1 MG capsule Take 5 mg by mouth 2 (two) times daily.     [provider]    Physical Exam: Vitals:   07/24/17 0016 07/24/17 0418 07/24/17 0631 07/24/17 0705  BP: (!) 241/101 (!) 197/97 (!) 197/106 (!) 208/95  Pulse: (!) 112 (!) 123    Resp: 20 20    Temp: 97.8 F (36.6 C) 99.1 F (37.3 C)    TempSrc:  Oral    SpO2: 100% 99%        Constitutional: Moderately built and nourished. Vitals:   07/24/17 0016 07/24/17 0418 07/24/17 0631  07/24/17 0705  BP: (!) 241/101 (!) 197/97 (!) 197/106 (!) 208/95  Pulse: (!) 112 (!) 123    Resp: 20 20    Temp: 97.8 F (36.6 C) 99.1 F (37.3 C)    TempSrc:  Oral    SpO2: 100% 99%     Eyes: Anicteric no pallor. ENMT: No discharge from the ears eyes nose or mouth. Neck: No mass felt.  No neck rigidity. Respiratory: No rhonchi or crepitations. Cardiovascular: S1-S2 heard no murmurs appreciated. Abdomen: Soft nontender bowel sounds present. Musculoskeletal: No edema.  No joint effusion.  Left BKA. Skin: No rash. Neurologic: Alert awake oriented to time place and person.  Moves all extremity's. Psychiatric: Patient already forthcoming with history otherwise appears normal.   Labs on Admission: I have personally reviewed following labs and imaging studies  CBC:  Recent Labs Lab 07/24/17 0054  WBC 11.5*  HGB 16.0  HCT 47.6  MCV 80.5  PLT 158   Basic Metabolic Panel:  Recent Labs Lab 07/24/17 0054  NA 142  K 3.7  CL 100*  CO2 21*  GLUCOSE 377*  BUN 16  CREATININE 0.93  CALCIUM 10.7*   GFR: CrCl cannot be calculated (Unknown ideal weight.). Liver Function Tests:  Recent Labs Lab 07/24/17 0054  AST 22  ALT 18  ALKPHOS 130*  BILITOT 0.9  PROT 9.6*  ALBUMIN 5.7*   No results for input(s): LIPASE, AMYLASE in the last 168 hours. No results for input(s): AMMONIA in the last 168 hours. Coagulation Profile: No results for input(s): INR, PROTIME in the last 168 hours. Cardiac Enzymes: No results for input(s): CKTOTAL, CKMB, CKMBINDEX, TROPONINI in the last 168 hours. BNP (last 3 results) No results for input(s): PROBNP in the last 8760 hours. HbA1C: No results for input(s): HGBA1C in the last 72 hours. CBG:  Recent Labs Lab 07/24/17 0047 07/24/17 0532  GLUCAP 355* 357*   Lipid Profile: No results for input(s): CHOL, HDL, LDLCALC, TRIG, CHOLHDL, LDLDIRECT in the last 72 hours. Thyroid Function Tests: No results for input(s): TSH, T4TOTAL, FREET4,  T3FREE, THYROIDAB in the last 72 hours. Anemia Panel: No results for input(s): VITAMINB12, FOLATE, FERRITIN, TIBC, IRON, RETICCTPCT in the last 72 hours. Urine analysis:    Component Value Date/Time   COLORURINE STRAW (A) 07/24/2017 0230   APPEARANCEUR CLEAR 07/24/2017 0230   LABSPEC 1.027 07/24/2017 0230   PHURINE 5.0 07/24/2017 0230   GLUCOSEU >=500 (A) 07/24/2017 0230   HGBUR SMALL (A) 07/24/2017 0230   BILIRUBINUR NEGATIVE 07/24/2017 0230   KETONESUR 80 (A) 07/24/2017 0230   PROTEINUR >=300 (A) 07/24/2017 0230   UROBILINOGEN 1.0 09/29/2013 2339   NITRITE NEGATIVE 07/24/2017 0230   LEUKOCYTESUR NEGATIVE 07/24/2017 0230   Sepsis Labs: @LABRCNTIP (procalcitonin:4,lacticidven:4) )No results found for this or any previous visit (from the past 240 hour(s)).   Radiological Exams on Admission: Dg Chest [facility]  1 View  Result Date: 07/24/2017 CLINICAL DATA:  Acute onset of hematemesis.  Initial encounter. EXAM: PORTABLE CHEST 1 VIEW COMPARISON:  Chest radiograph performed 10/17/2016 FINDINGS: The lungs are well-aerated and clear. There is no evidence of focal opacification, pleural effusion or pneumothorax. The cardiomediastinal silhouette is within normal limits. No acute osseous abnormalities are seen. IMPRESSION: No acute cardiopulmonary process seen. Electronically Signed   By: Roanna Raider M.D.   On: 07/24/2017 06:47     Assessment/Plan Principal Problem:   Hematemesis Active Problems:   Diabetes mellitus without complication (HCC)   Peripheral vascular disease (HCC)   S/P BKA (below knee amputation) unilateral (HCC)   Hypertensive urgency   Acute GI bleeding    1. Acute GI bleed and hematemesis -likely upper GI.  Patient is kept n.p.o. except medications and IV Protonix infusion will be continued.  Check serial CBC transfuse if patient becomes hypotensive or hemoglobin falls less than 7.  I am reaching out to gastroenterology for consult.  Will check x-ray of  abdomen. 2. Hypertensive urgency -for now patient is on as needed IV hydralazine.  If blood pressure does not good control will need to have antihypertensive infusions. 3. Diabetes mellitus type 2 with hyperglycemia -patient states he does not take any diabetic medications.  I have placed patient on sliding scale coverage.  I will recheck a metabolic panel and if there is any evidence of ketoacidosis will start on IV insulin infusion.  Closely follow CBGs. 4. History of renal and pancreatic transplant -we will continue with no suppressants. 5. History of peripheral vascular disease status post left BKA.  I have reviewed patient's old charts and labs. In 2008 patient had colonoscopy by Dr. Christella Hartigan which was unremarkable.   DVT prophylaxis: SCDs. Code Status: Full code. Family Communication: Patient's wife. Disposition Plan: Home. Consults called: Gastroenterology. Admission status: Observation.   Eduard Clos MD [facility] Pager [phone].  If 7PM-7AM, please contact night-coverage [email or web address] Password [email or web address]  07/24/2017, 8:18 AM

## 2017-07-24 NOTE — Consult Note (Signed)
Consultation  Referring Provider:  Dr. Toniann Fail    Primary Care Physician:  Patient, No Pcp Per Primary Gastroenterologist: Dr. Christella Hartigan         Reason for Consultation: Hematemesis             HPI:   [name] is a 66 y.o. male with past medical history as listed below including CPAP, DM, GERD, HTN, PVD, s/p renal and pancreatic transplant, who presented to the ED today with complaint of persistent nausea and vomiting with hematemesis.   Patient is found alone in the room today at the time of my interview and is unable to relay any history as he just mumbles and hardly moves as I do my exam. History is garnered from previous physician's notes. Apparently, patients wife came home yesterday to find him vomiting and when she returned later that day she saw "multiple piles of bright red bloody vomit" on the floor. She brought him to the ER. Per ED no history of NSAIDs.   Per nursing, patient had small amount of vomitus here which looked like "a little bit of coffee grounds". Apparently, the patients wife went home just recently to go "clean the blood off the floor". Also of note, patients wife explains that her husband is usually "with it" at home and "this is not him at all".  ED Course: In the ER hemoglobin is around 16 patient is hypertensive and tachycardic.  Patient was started on Protonix infusion and admitted for acute GI bleed.  Patient blood pressure was also elevated more than 200 with blood sugars also elevated.  Patient was placed on as needed IV hydralazine.  Past GI HIstory: Colonoscopy, 06/13/2007: Dr. Lynn Ito, no polyps or cancer  Past Medical History:  Diagnosis Date  . Anxiety   . CPAP (continuous positive airway pressure) dependence   . Diabetes mellitus without complication (HCC)   . GERD (gastroesophageal reflux disease)   . Hypertension   . Legally blind in left eye, as defined in [place]   . PTSD (post-traumatic stress disorder)   . PVD (peripheral vascular  disease) (HCC) 01/17/2015  . Renal disorder   . Sleep apnea   . Thyroid disease     Past Surgical History:  Procedure Laterality Date  . ABDOMINAL AORTAGRAM N/A 01/21/2015   Procedure: ABDOMINAL Ronny Flurry;  Surgeon: Chuck Hint, MD;  Location: [facility] CATH LAB;  Service: Cardiovascular;  Laterality: N/A;  . AMPUTATION Left 01/23/2015   Procedure: FIRST RAY AMPUTATION/LEFT FOOT;  Surgeon: Nadara Mustard, MD;  Location: [facility] OR;  Service: Orthopedics;  Laterality: Left;  . AMPUTATION Left 02/20/2015   Procedure: AMPUTATION BELOW KNEE;  Surgeon: Nadara Mustard, MD;  Location: [facility] OR;  Service: Orthopedics;  Laterality: Left;  . AV FISTULA PLACEMENT    . COLONOSCOPY    . COMBINED KIDNEY-PANCREAS TRANSPLANT Left 2009  . INCISION AND DRAINAGE ABSCESS Left 01/13/2015   Procedure: INCISION AND DRAINAGE OF LEFT FOOT FIRST RAY ABCESS;  Surgeon: Kathryne Hitch, MD;  Location: [facility] OR;  Service: Orthopedics;  Laterality: Left;  . LOWER EXTREMITY ANGIOGRAM Left 01/21/2015   Procedure: LOWER EXTREMITY ANGIOGRAM;  Surgeon: Chuck Hint, MD;  Location: [facility] CATH LAB;  Service: Cardiovascular;  Laterality: Left;    Family History  Problem Relation Age of Onset  . Hypertension Other      Social History  Substance Use Topics  . Smoking status: Former Games developer  . Smokeless tobacco: Never Used     Comment:  quit i 1994ish  . Alcohol use Yes     Comment: 1 beer rarely     Prior to Admission medications   Medication Sig Start Date End Date Taking? Authorizing Provider  mycophenolate (CELLCEPT) 250 MG capsule Take 1,000 mg by mouth 2 (two) times daily.   Yes [provider]  aspirin EC 325 MG EC tablet Take 1 tablet (325 mg total) by mouth daily. 03/05/15   Love, Evlyn KannerPamela S, PA-C  tacrolimus (PROGRAF) 1 MG capsule Take 5 mg by mouth 2 (two) times daily.     [provider]    Current Facility-Administered Medications  Medication Dose Route Frequency Provider Last Rate Last Dose   . 0.9 %  sodium chloride infusion   Intravenous Continuous Madelyn FlavorsSmith, Rondell A, MD   Stopped at 07/24/17 0810  . acetaminophen (TYLENOL) tablet 650 mg  650 mg Oral Q6H PRN Eduard ClosKakrakandy, Arshad N, MD       Or  . acetaminophen (TYLENOL) suppository 650 mg  650 mg Rectal Q6H PRN Eduard ClosKakrakandy, Arshad N, MD      . hydrALAZINE (APRESOLINE) injection 10 mg  10 mg Intravenous Q4H PRN Madelyn FlavorsSmith, Rondell A, MD   10 mg at 07/24/17 0858  . insulin aspart (novoLOG) injection 0-9 Units  0-9 Units Subcutaneous Q4H Eduard ClosKakrakandy, Arshad N, MD   5 Units at 07/24/17 [phone]  . mycophenolate (CELLCEPT) capsule 1,000 mg  1,000 mg Oral BID Eduard ClosKakrakandy, Arshad N, MD      . ondansetron [facility](ZOFRAN) tablet 4 mg  4 mg Oral Q6H PRN Eduard ClosKakrakandy, Arshad N, MD       Or  . ondansetron [facility](ZOFRAN) injection 4 mg  4 mg Intravenous Q6H PRN Eduard ClosKakrakandy, Arshad N, MD   4 mg at 07/24/17 [phone]  . pantoprazole (PROTONIX) 80 mg in sodium chloride 0.9 % 250 mL (0.32 mg/mL) infusion  8 mg/hr Intravenous Continuous Kemler, Rondell A, MD      . Melene Muller[START ON 07/27/2017] pantoprazole (PROTONIX) injection 40 mg  40 mg Intravenous Q12H Storey, Rondell A, MD      . tacrolimus (PROGRAF) capsule 5 mg  5 mg Oral BID Eduard ClosKakrakandy, Arshad N, MD        Allergies as of 07/23/2017  . (No Known Allergies)   Review of Systems:    Unable to complete due to mental status   Physical Exam:  Vital signs in last 24 hours: Temp:  [97.8 F (36.6 C)-99.1 F (37.3 C)] 98.7 F (37.1 C) (10/27 0858) Pulse Rate:  [112-123] 117 (10/27 0843) Resp:  [19-24] 24 (10/27 0858) BP: (197-241)/(84-106) 220/84 (10/27 0858) SpO2:  [99 %-100 %] 99 % (10/27 0858) Weight:  [161 lb 2.5 oz (73.1 kg)] 161 lb 2.5 oz (73.1 kg) (10/27 0800) Last BM Date: 07/23/17 General: AA male appears to be in NAD, Well developed, Well nourished, lethargic and uncooperative Head:  Normocephalic and atraumatic. Eyes:   PEERL, EOMI. No icterus.  Ears:  Normal auditory acuity. Neck:  Supple Throat: Oral cavity and  pharynx without inflammation, swelling or lesion.  Lungs: Respirations even and unlabored. Lungs clear to auscultation bilaterally.   No wheezes, crackles, or rhonchi.  Heart: Normal S1, S2. No MRG. Regular rate and rhythm. No peripheral edema, cyanosis or pallor.  Abdomen:  Limited as patient would not move to let me examine him, surgical scar present down midline of abdomen, Soft, nondistended, nontender. No rebound or guarding. Normal bowel sounds. No appreciable masses or hepatomegaly. Rectal:  Not performed.  Msk:  Symmetrical without  gross deformities. Extremities:  Without edema, no deformity or joint abnormality. Neurologic:  Appears grossly normal neurologically Skin:   Dry and intact without significant lesions or rashes. Psychiatric: does not answer questions   LAB RESULTS:  Recent Labs  07/24/17 0054 07/24/17 0849  WBC 11.5* 15.7*  HGB 16.0 16.7  HCT 47.6 48.8  PLT 158 140*   BMET  Recent Labs  07/24/17 0054 07/24/17 0849  NA 142 143  K 3.7 4.0  CL 100* 104  CO2 21* 21*  GLUCOSE 377* 315*  BUN 16 14  CREATININE 0.93 0.83  CALCIUM 10.7* 9.9   LFT  Recent Labs  07/24/17 0054  PROT 9.6*  ALBUMIN 5.7*  AST 22  ALT 18  ALKPHOS 130*  BILITOT 0.9   STUDIES: Dg Abd 1 View  Result Date: 07/24/2017 CLINICAL DATA:  Nausea and vomiting. Status post renal and pancreatic transplant with persistent nausea, vomiting and hematemesis. EXAM: ABDOMEN - 1 VIEW COMPARISON:  None. FINDINGS: Paucity of bowel gas within the abdomen and pelvis. Postsurgical sutures within the right abdomen. No evidence of free intraperitoneal air seen. No acute or suspicious osseous finding. IMPRESSION: No acute findings. Paucity of bowel gas limiting characterization of the bowel gas pattern. No dilated bowel loops appreciated. Electronically Signed   By: Bary Richard M.D.   On: 07/24/2017 09:33   Dg Chest Port 1 View  Result Date: 07/24/2017 CLINICAL DATA:  Acute onset of hematemesis.   Initial encounter. EXAM: PORTABLE CHEST 1 VIEW COMPARISON:  Chest radiograph performed 10/17/2016 FINDINGS: The lungs are well-aerated and clear. There is no evidence of focal opacification, pleural effusion or pneumothorax. The cardiomediastinal silhouette is within normal limits. No acute osseous abnormalities are seen. IMPRESSION: No acute cardiopulmonary process seen. Electronically Signed   By: Roanna Raider M.D.   On: 07/24/2017 06:47   PREVIOUS ENDOSCOPIES:            See HPI   Impression / Plan:   Impression: 1. Hematemesis: history per patient's wife who is not in room at time of my interview, multiple episodes last night, small amt of coffee ground emesis today per nursing, hgb stable, hypertensive; Likely this is not an acute upper GI bleed, more likely mallory weiss tear vs other 2. HTN urgency 3. DM type 2 4. H/o renal and pancreatic transplant 5. H/o PVD s/p L BKA 6. AMS: uncertain etiology, consider relation to htn urgency vs other  Plan: 1. At this time, no plans for emergent EGD. Would recommend better control of BP and DM first and observing for further signs of GI bleeding 2. Continue to monitor hgb with transfusion as needed 3. Agree with anti-emetics 4. Agree with BID IV PPI 5. NPO for now 6. Please await any further recommendations from Dr. Leone Payor later today  Thank you for your kind consultation, we will continue to follow.  Violet Baldy Lemmon  07/24/2017, 10:25 AM Pager #: [phone]    [facility] Attending   I have taken an interval history, reviewed the chart and examined the patient. I agree with the Advanced Practitioner's note, impression and recommendations.   Doubt major bleeding.  Treat htn  Will see in AM  Iva Boop, MD, [facility] [phone] (pager) 07/24/2017 12:54 PM

## 2017-07-24 NOTE — Progress Notes (Signed)
   07/24/17 1100 07/24/17 1115  Vitals  BP (!) 193/56 (!) 167/69  MAP (mmHg) 104 103  1100 Gave 10mg  IV labatelol 1115, repeat BP post intervention

## 2017-07-24 NOTE — ED Notes (Signed)
Patient denies pain and is resting comfortably.  

## 2017-07-24 NOTE — Plan of Care (Addendum)
[name] is 66 y/o male with pmh of PTSD, DM type II, PVD, Genella RifeGerd; who presents with complaints of hematemesis. VS: Temperature afebrile, pulse 112-123, respirations 20, BP 197/97- 241/101, O2 sats maintained on RA. Gastric continence was positive for blood.  Hemoglobin initially 16 and glucose 377.  Patient reportedly pulled IV and they are currently trying to reestablish a line at this time.   Admitted to stepdown unit due to elevated blood pressures and possible risks of hemodynamic compromise.  Will need to consult GI in a.m.

## 2017-07-24 NOTE — ED Notes (Signed)
Bed: WA21 Expected date:  Expected time:  Means of arrival:  Comments: EMS 66 yo male nausea and vomiting-dark brown emesis CBG 452

## 2017-07-24 NOTE — ED Provider Notes (Signed)
[facility]-EMERGENCY DEPT Provider Note: Lowella DellJ. Lane Kimarie Coor, MD, FACEP  CSN: [id number] MRN: [id number] ARRIVAL: 07/23/17 at 2359 ROOM: WA21/WA21   CHIEF COMPLAINT  Hematemesis   HISTORY OF PRESENT ILLNESS  07/24/17 5:04 AM Ascencion DikeClarence Kaczorowski is a 66 y.o. male with diabetes and renal failure status post pancreas and kidney transplant.  He is here with complaints of nausea and vomiting which she states is been going on all week but EMS reported had been going on all day yesterday.  EMS reports the emesis was dark brown and he has vomited dark brown material while in the ED.  He denies associated abdominal pain and rates his pain as a 0 out of 10 now but stated his pain was 10 out of 10 on arrival.  He has had associated generalized weakness.  He said he has had some diarrhea with this.  He was noted to be hyperglycemic with a blood sugar of 452 in the field.  He was also noted to be hypertensive with a blood pressure of 241/101 on arrival.   Family member states the patient is not usually forthcoming with information.  Level 5 caveat applies.   Past Medical History:  Diagnosis Date  . Anxiety   . CPAP (continuous positive airway pressure) dependence   . Diabetes mellitus without complication (HCC)   . GERD (gastroesophageal reflux disease)   . Hypertension   . Legally blind in left eye, as defined in [place]   . PTSD (post-traumatic stress disorder)   . PVD (peripheral vascular disease) (HCC) 01/17/2015  . Renal disorder   . Sleep apnea   . Thyroid disease     Past Surgical History:  Procedure Laterality Date  . ABDOMINAL AORTAGRAM N/A 01/21/2015   Procedure: ABDOMINAL Ronny FlurryAORTAGRAM;  Surgeon: Chuck Hinthristopher S Dickson, MD;  [facility];  Service: Cardiovascular;  Laterality: N/A;  . AMPUTATION Left 01/23/2015   Procedure: FIRST RAY AMPUTATION/LEFT FOOT;  Surgeon: Nadara MustardMarcus Duda V, MD;  Location: [facility] OR;  Service: Orthopedics;  Laterality: Left;  . AMPUTATION Left 02/20/2015   Procedure: AMPUTATION  BELOW KNEE;  Surgeon: Nadara MustardMarcus Duda V, MD;  Location: [facility] OR;  Service: Orthopedics;  Laterality: Left;  . AV FISTULA PLACEMENT    . COLONOSCOPY    . COMBINED KIDNEY-PANCREAS TRANSPLANT Left 2009  . INCISION AND DRAINAGE ABSCESS Left 01/13/2015   Procedure: INCISION AND DRAINAGE OF LEFT FOOT FIRST RAY ABCESS;  Surgeon: Kathryne Hitchhristopher Y Blackman, MD;  Location: [facility] OR;  Service: Orthopedics;  Laterality: Left;  . LOWER EXTREMITY ANGIOGRAM Left 01/21/2015   Procedure: LOWER EXTREMITY ANGIOGRAM;  Surgeon: Chuck Hinthristopher S Dickson, MD;  [facility];  Service: Cardiovascular;  Laterality: Left;    History reviewed. No pertinent family history.  Social History  Substance Use Topics  . Smoking status: Former Games developermoker  . Smokeless tobacco: Never Used     Comment: quit i 1994ish  . Alcohol use Yes     Comment: 1 beer rarely     Prior to Admission medications   Medication Sig Start Date End Date Taking? Authorizing Provider  mycophenolate (CELLCEPT) 250 MG capsule Take 1,000 mg by mouth 2 (two) times daily.   Yes [provider]  aspirin EC 325 MG EC tablet Take 1 tablet (325 mg total) by mouth daily. 03/05/15   Love, Evlyn KannerPamela S, PA-C  tacrolimus (PROGRAF) 1 MG capsule Take 5 mg by mouth 2 (two) times daily.     [provider]    Allergies Patient has no  known allergies.   REVIEW OF SYSTEMS     PHYSICAL EXAMINATION  Initial Vital Signs Blood pressure (!) 197/97, pulse (!) 123, temperature 99.1 F (37.3 C), temperature source Oral, resp. rate 20, SpO2 99 %.  Examination General: Well-developed, well-nourished male in no acute distress; appearance consistent with age of record HENT: normocephalic; atraumatic Eyes: extraocular muscles intact Neck: supple Heart: regular rate and rhythm; 3 out of 6 systolic murmur at the right upper sternal border Lungs: clear to auscultation bilaterally Abdomen: soft; nondistended; nontender; bowel sounds present Extremities: Left BKA;  dialysis fistula right upper arm with pulse and thrill Neurologic: Awake, alert; motor function intact in all extremities and symmetric; no facial droop Skin: Warm and dry Psychiatric: Flat affect; poverty of speech   RESULTS  Summary of this visit's results, reviewed by myself:   EKG Interpretation  Date/Time:    Ventricular Rate:    PR Interval:    QRS Duration:   QT Interval:    QTC Calculation:   R Axis:     Text Interpretation:        Laboratory Studies: Results for orders placed or performed during the hospital encounter of 07/24/17 (from the past 24 hour(s))  CBG monitoring, ED     Status: Abnormal   Collection Time: 07/24/17 12:47 AM  Result Value Ref Range   Glucose-Capillary 355 (H) 65 - 99 mg/dL  ABO/Rh     Status: None   Collection Time: 07/24/17 12:50 AM  Result Value Ref Range   ABO/RH(D) O POS   Comprehensive metabolic panel     Status: Abnormal   Collection Time: 07/24/17 12:54 AM  Result Value Ref Range   Sodium 142 135 - 145 mmol/L   Potassium 3.7 3.5 - 5.1 mmol/L   Chloride 100 (L) 101 - 111 mmol/L   CO2 21 (L) 22 - 32 mmol/L   Glucose, Bld 377 (H) 65 - 99 mg/dL   BUN 16 6 - 20 mg/dL   Creatinine, Ser 1.61 0.61 - 1.24 mg/dL   Calcium 09.6 (H) 8.9 - 10.3 mg/dL   Total Protein 9.6 (H) 6.5 - 8.1 g/dL   Albumin 5.7 (H) 3.5 - 5.0 g/dL   AST 22 15 - 41 U/L   ALT 18 17 - 63 U/L   Alkaline Phosphatase 130 (H) 38 - 126 U/L   Total Bilirubin 0.9 0.3 - 1.2 mg/dL   GFR calc non Af Amer >60 >60 mL/min   GFR calc Af Amer >60 >60 mL/min   Anion gap 21 (H) 5 - 15  CBC     Status: Abnormal   Collection Time: 07/24/17 12:54 AM  Result Value Ref Range   WBC 11.5 (H) 4.0 - 10.5 K/uL   RBC 5.91 (H) 4.22 - 5.81 MIL/uL   Hemoglobin 16.0 13.0 - 17.0 g/dL   HCT 04.5 40.9 - 81.1 %   MCV 80.5 78.0 - 100.0 fL   MCH 27.1 26.0 - 34.0 pg   MCHC 33.6 30.0 - 36.0 g/dL   RDW 91.4 78.2 - 95.6 %   Platelets 158 150 - 400 K/uL  Type and screen [facility]     Status: None   Collection Time: 07/24/17 12:54 AM  Result Value Ref Range   ABO/RH(D) O POS    Antibody Screen NEG    Sample Expiration 07/27/2017   Urinalysis, Routine w reflex microscopic     Status: Abnormal   Collection Time: 07/24/17  2:30 AM  Result Value  Ref Range   Color, Urine STRAW (A) YELLOW   APPearance CLEAR CLEAR   Specific Gravity, Urine 1.027 1.005 - 1.030   pH 5.0 5.0 - 8.0   Glucose, UA >=500 (A) NEGATIVE mg/dL   Hgb urine dipstick SMALL (A) NEGATIVE   Bilirubin Urine NEGATIVE NEGATIVE   Ketones, ur 80 (A) NEGATIVE mg/dL   Protein, ur >=161 (A) NEGATIVE mg/dL   Nitrite NEGATIVE NEGATIVE   Leukocytes, UA NEGATIVE NEGATIVE   RBC / HPF 0-5 0 - 5 RBC/hpf   WBC, UA 0-5 0 - 5 WBC/hpf   Bacteria, UA RARE (A) NONE SEEN   Squamous Epithelial / LPF NONE SEEN NONE SEEN  Occult bld gastric/duodenum (cup to lab)     Status: Abnormal   Collection Time: 07/24/17  5:16 AM  Result Value Ref Range   pH, Gastric 3    Occult Blood, Gastric POSITIVE (A) NEGATIVE  CBG monitoring, ED     Status: Abnormal   Collection Time: 07/24/17  5:32 AM  Result Value Ref Range   Glucose-Capillary 357 (H) 65 - 99 mg/dL   Imaging Studies: No results found.  ED COURSE  Nursing notes and initial vitals signs, including pulse oximetry, reviewed.  Vitals:   07/24/17 0016 07/24/17 0418  BP: (!) 241/101 (!) 197/97  Pulse: (!) 112 (!) 123  Resp: 20 20  Temp: 97.8 F (36.6 C) 99.1 F (37.3 C)  TempSrc:  Oral  SpO2: 100% 99%   5:37 AM IV fluid bolus ordered but patient pulled out IV.  IV team consulted.  Subcutaneous insulin ordered.  PROCEDURES    ED DIAGNOSES     ICD-10-CM   1. Hematemesis with nausea K92.0   2. Hyperglycemia R73.9        Lugene Beougher, Jonny Ruiz, MD 07/24/17 [phone]

## 2017-07-24 NOTE — ED Triage Notes (Signed)
Pt hypergylcemic 452. Pt with HTN of 226/100. Pt with known history unable to take pos today d/t nausea and vomiting all day. EMS reports emesis was dark brown in nature. HR 110s sats 90% on Rm Air RR of 18 with EMS.

## 2017-07-25 ENCOUNTER — Encounter (HOSPITAL_COMMUNITY): Payer: Self-pay | Admitting: Internal Medicine

## 2017-07-25 DIAGNOSIS — R739 Hyperglycemia, unspecified: Secondary | ICD-10-CM | POA: Diagnosis not present

## 2017-07-25 DIAGNOSIS — K219 Gastro-esophageal reflux disease without esophagitis: Secondary | ICD-10-CM | POA: Diagnosis not present

## 2017-07-25 DIAGNOSIS — E119 Type 2 diabetes mellitus without complications: Secondary | ICD-10-CM | POA: Diagnosis not present

## 2017-07-25 DIAGNOSIS — Z79899 Other long term (current) drug therapy: Secondary | ICD-10-CM | POA: Diagnosis not present

## 2017-07-25 DIAGNOSIS — Z940 Kidney transplant status: Secondary | ICD-10-CM | POA: Diagnosis not present

## 2017-07-25 DIAGNOSIS — F419 Anxiety disorder, unspecified: Secondary | ICD-10-CM | POA: Diagnosis not present

## 2017-07-25 DIAGNOSIS — K920 Hematemesis: Secondary | ICD-10-CM | POA: Diagnosis not present

## 2017-07-25 DIAGNOSIS — Z87891 Personal history of nicotine dependence: Secondary | ICD-10-CM | POA: Diagnosis not present

## 2017-07-25 DIAGNOSIS — F4310 Post-traumatic stress disorder, unspecified: Secondary | ICD-10-CM | POA: Diagnosis present

## 2017-07-25 DIAGNOSIS — Z7982 Long term (current) use of aspirin: Secondary | ICD-10-CM | POA: Diagnosis not present

## 2017-07-25 DIAGNOSIS — K922 Gastrointestinal hemorrhage, unspecified: Secondary | ICD-10-CM | POA: Diagnosis not present

## 2017-07-25 DIAGNOSIS — Z9483 Pancreas transplant status: Secondary | ICD-10-CM | POA: Diagnosis not present

## 2017-07-25 DIAGNOSIS — R112 Nausea with vomiting, unspecified: Secondary | ICD-10-CM

## 2017-07-25 DIAGNOSIS — Z89512 Acquired absence of left leg below knee: Secondary | ICD-10-CM | POA: Diagnosis not present

## 2017-07-25 DIAGNOSIS — I10 Essential (primary) hypertension: Secondary | ICD-10-CM | POA: Diagnosis not present

## 2017-07-25 DIAGNOSIS — R0600 Dyspnea, unspecified: Secondary | ICD-10-CM | POA: Diagnosis not present

## 2017-07-25 DIAGNOSIS — K226 Gastro-esophageal laceration-hemorrhage syndrome: Secondary | ICD-10-CM | POA: Diagnosis not present

## 2017-07-25 DIAGNOSIS — E1151 Type 2 diabetes mellitus with diabetic peripheral angiopathy without gangrene: Secondary | ICD-10-CM | POA: Diagnosis not present

## 2017-07-25 DIAGNOSIS — Z9119 Patient's noncompliance with other medical treatment and regimen: Secondary | ICD-10-CM | POA: Diagnosis not present

## 2017-07-25 DIAGNOSIS — I739 Peripheral vascular disease, unspecified: Secondary | ICD-10-CM

## 2017-07-25 DIAGNOSIS — G4730 Sleep apnea, unspecified: Secondary | ICD-10-CM | POA: Diagnosis present

## 2017-07-25 DIAGNOSIS — H548 Legal blindness, as defined in USA: Secondary | ICD-10-CM | POA: Diagnosis not present

## 2017-07-25 DIAGNOSIS — I160 Hypertensive urgency: Secondary | ICD-10-CM | POA: Diagnosis not present

## 2017-07-25 DIAGNOSIS — E8770 Fluid overload, unspecified: Secondary | ICD-10-CM | POA: Diagnosis present

## 2017-07-25 DIAGNOSIS — E1165 Type 2 diabetes mellitus with hyperglycemia: Secondary | ICD-10-CM | POA: Diagnosis present

## 2017-07-25 LAB — BASIC METABOLIC PANEL
Anion gap: 14 (ref 5–15)
BUN: 15 mg/dL (ref 6–20)
CO2: 23 mmol/L (ref 22–32)
Calcium: 9.4 mg/dL (ref 8.9–10.3)
Chloride: 103 mmol/L (ref 101–111)
Creatinine, Ser: 0.81 mg/dL (ref 0.61–1.24)
GFR calc Af Amer: >60 mL/min (ref >60)
GFR calc non Af Amer: >60 mL/min (ref >60)
Glucose, Bld: 197 mg/dL — ABNORMAL HIGH (ref 65–99)
Potassium: 3.4 mmol/L — ABNORMAL LOW (ref 3.5–5.1)
Sodium: 140 mmol/L (ref 135–145)

## 2017-07-25 LAB — GLUCOSE, CAPILLARY
Glucose-Capillary: 149 mg/dL — ABNORMAL HIGH (ref 65–99)
Glucose-Capillary: 182 mg/dL — ABNORMAL HIGH (ref 65–99)
Glucose-Capillary: 199 mg/dL — ABNORMAL HIGH (ref 65–99)
Glucose-Capillary: 206 mg/dL — ABNORMAL HIGH (ref 65–99)
Glucose-Capillary: 218 mg/dL — ABNORMAL HIGH (ref 65–99)
Glucose-Capillary: 219 mg/dL — ABNORMAL HIGH (ref 65–99)

## 2017-07-25 LAB — CBC WITH DIFFERENTIAL/PLATELET
Basophils Absolute: 0 10*3/uL (ref 0.0–0.1)
Basophils Relative: 0 %
Eosinophils Absolute: 0 10*3/uL (ref 0.0–0.7)
Eosinophils Relative: 0 %
HCT: 42.9 % (ref 39.0–52.0)
Hemoglobin: 14.4 g/dL (ref 13.0–17.0)
Lymphocytes Relative: 11 %
Lymphs Abs: 1.4 10*3/uL (ref 0.7–4.0)
MCH: 26.9 pg (ref 26.0–34.0)
MCHC: 33.6 g/dL (ref 30.0–36.0)
MCV: 80 fL (ref 78.0–100.0)
Monocytes Absolute: 0.7 10*3/uL (ref 0.1–1.0)
Monocytes Relative: 5 %
Neutro Abs: 10.7 10*3/uL — ABNORMAL HIGH (ref 1.7–7.7)
Neutrophils Relative %: 84 %
Platelets: 142 10*3/uL — ABNORMAL LOW (ref 150–400)
RBC: 5.36 MIL/uL (ref 4.22–5.81)
RDW: 13.9 % (ref 11.5–15.5)
WBC: 12.8 10*3/uL — ABNORMAL HIGH (ref 4.0–10.5)

## 2017-07-25 LAB — LACTIC ACID, PLASMA: Lactic Acid, Venous: 2.3 mmol/L (ref 0.5–1.9)

## 2017-07-25 LAB — HIV ANTIBODY (ROUTINE TESTING W REFLEX): HIV Screen 4th Generation wRfx: NONREACTIVE

## 2017-07-25 MED ORDER — HYDRALAZINE HCL 20 MG/ML IJ SOLN
10.0000 mg | Freq: Once | INTRAMUSCULAR | Status: AC
  Administered 2017-07-25: 10 mg via INTRAVENOUS
  Filled 2017-07-25: qty 1

## 2017-07-25 MED ORDER — HYDRALAZINE HCL 20 MG/ML IJ SOLN
10.0000 mg | Freq: Four times a day (QID) | INTRAMUSCULAR | Status: DC | PRN
  Administered 2017-07-25 – 2017-07-26 (×4): 10 mg via INTRAVENOUS
  Filled 2017-07-25 (×4): qty 1

## 2017-07-25 MED ORDER — PANTOPRAZOLE SODIUM 40 MG PO TBEC
40.0000 mg | DELAYED_RELEASE_TABLET | Freq: Every day | ORAL | Status: DC
  Administered 2017-07-26: 40 mg via ORAL
  Filled 2017-07-25: qty 1

## 2017-07-25 MED ORDER — TACROLIMUS 1 MG PO CAPS: 5.0000 mg | ORAL_CAPSULE | Freq: Two times a day (BID) | ORAL | Status: DC

## 2017-07-25 MED ORDER — ENALAPRILAT 1.25 MG/ML IV SOLN
1.2500 mg | Freq: Four times a day (QID) | INTRAVENOUS | Status: AC
  Administered 2017-07-25: 1.25 mg via INTRAVENOUS
  Filled 2017-07-25 (×2): qty 1

## 2017-07-25 MED ORDER — SODIUM CHLORIDE 0.9 % IV SOLN
INTRAVENOUS | Status: AC
  Administered 2017-07-25 (×2): via INTRAVENOUS
  Filled 2017-07-25 (×2): qty 1000

## 2017-07-25 MED ORDER — CLONIDINE HCL 0.1 MG PO TABS
0.1000 mg | ORAL_TABLET | Freq: Once | ORAL | Status: AC
  Administered 2017-07-25: 0.1 mg via ORAL
  Filled 2017-07-25: qty 1

## 2017-07-25 MED ORDER — CLONIDINE HCL 0.1 MG PO TABS
0.2000 mg | ORAL_TABLET | Freq: Once | ORAL | Status: AC
  Administered 2017-07-25: 0.2 mg via ORAL
  Filled 2017-07-25: qty 2

## 2017-07-25 MED ORDER — AMLODIPINE BESYLATE 10 MG PO TABS
10.0000 mg | ORAL_TABLET | Freq: Every day | ORAL | Status: DC
  Administered 2017-07-25 – 2017-07-28 (×4): 10 mg via ORAL
  Filled 2017-07-25 (×4): qty 1

## 2017-07-25 NOTE — Progress Notes (Signed)
   Patient Name: [name] Date of Encounter: 07/25/2017, 2:33 PM    Subjective  Resting in bed No more vomiting Discussed w/ wife - no frank blood seen except ? Trace Vomitus chocolate color at home In ED was brown   Objective  BP (!) 191/71   Pulse 87   Temp 98.8 F (37.1 C) (Oral)   Resp 13   Ht 5\' 10"  (1.778 m)   Wt 161 lb 2.5 oz (73.1 kg)   SpO2 99%   BMI 23.12 kg/m  Sleeping  CBC Latest Ref Rng & Units 07/25/2017 07/24/2017 07/24/2017  WBC 4.0 - 10.5 K/uL 12.8(H) 15.3(H) 15.1(H)  Hemoglobin 13.0 - 17.0 g/dL 96.014.4 45.415.9 09.816.0  Hematocrit 39.0 - 52.0 % 42.9 45.1 46.8  Platelets 150 - 400 K/uL 142(L) 153 171      Assessment and Plan  Coffee ground emesis - no decreased Hgb Hypertensive urgency - I believe source of emesis and not having GI hemorrhage   Plan - oral PPI qd Advance diet No EGD Call us back prn No need for GI outpatient f/u regarding this unless persistent problems   Iva Booparl E. Avabella Wailes, MD, [facility] [phone] (pager) 07/25/2017 2:33 PM

## 2017-07-25 NOTE — Progress Notes (Signed)
[facility] PROGRESS NOTE    Progress Note  [name]  ZOX:[id number] DOB: 1951/08/20 DOA: 07/24/2017 PCP: Patient, No Pcp Per     Brief Narrative:   [name] is an 66 y.o. male past medical history negative for pneumonia, diabetes mellitus2, peripheral vascular disease status post renal and pancreatic transplants was presents to [facility] long ED for nausea vomiting hematemesis, according to patient's wife she came home and found him vomiting, when she returned home later that day she relates she saw bright red blood on the floor and seems like some coffee-ground hematemesis, he denies any NSAIDs.  Assessment/Plan:   Hematemesis He relates multiple episodes of coffee-ground hematemesis the day prior to admission. GI was consulted, who agreed with IV Protonix, nothing by mouth. And reevaluate in the morning. Continue IV Zofran for nausea. CBC is morning is pending. He denies any NSAIDs hold aspirin  Hypertensive urgency. Blood pressure on admission was greater than 200/100, he was started on clonidine, hydralazine and labetalol.  Pressure this morning is much improved. Resume his home dose of Norvasc.  Diabetes mellitus without complication (HCC) A1c was 10.4 which points towards noncompliance, blood glucose is improving with sliding scale.  Peripheral vascular disease (HCC) Hold aspirin.  S/P BKA (below knee amputation) unilateral (HCC) Stump is clean.  History of renal and pancreatic transplant: Continue current medication.   DVT prophylaxis: SCD Family Communication:none Disposition Plan/Barrier to D/C: home in 2-3 days Code Status:     Code Status Orders        Start     Ordered   07/24/17 0817  Full code  Continuous     07/24/17 0818    Code Status History    Date Active Date Inactive Code Status Order ID Comments User Context   02/22/2015  5:16 PM 03/05/2015  8:43 PM Full Code [id number]  Jacquelynn Cree, PA-C Inpatient   02/20/2015 11:40 AM  02/22/2015  5:16 PM Full Code [id number]  Nadara Mustard, MD Inpatient   01/23/2015  2:27 PM 01/25/2015  7:00 PM Full Code [id number]  Nadara Mustard, MD Inpatient   01/21/2015 12:14 PM 01/23/2015  2:27 PM Full Code [id number]  Chuck Hint, MD Inpatient   01/13/2015  6:12 PM 01/21/2015 12:14 PM Full Code [id number]  Kathryne Hitch, MD Inpatient   01/13/2015  2:15 AM 01/13/2015  6:12 PM Full Code [id number]  Ron Parker, MD Inpatient        IV Access:    Peripheral IV   Procedures and diagnostic studies:   Dg Abd 1 View  Result Date: 07/24/2017 CLINICAL DATA:  Nausea and vomiting. Status post renal and pancreatic transplant with persistent nausea, vomiting and hematemesis. EXAM: ABDOMEN - 1 VIEW COMPARISON:  None. FINDINGS: Paucity of bowel gas within the abdomen and pelvis. Postsurgical sutures within the right abdomen. No evidence of free intraperitoneal air seen. No acute or suspicious osseous finding. IMPRESSION: No acute findings. Paucity of bowel gas limiting characterization of the bowel gas pattern. No dilated bowel loops appreciated. Electronically Signed   By: Bary Richard M.D.   On: 07/24/2017 09:33   Dg Chest Port 1 View  Result Date: 07/24/2017 CLINICAL DATA:  Acute onset of hematemesis.  Initial encounter. EXAM: PORTABLE CHEST 1 VIEW COMPARISON:  Chest radiograph performed 10/17/2016 FINDINGS: The lungs are well-aerated and clear. There is no evidence of focal opacification, pleural effusion or pneumothorax. The cardiomediastinal silhouette is within normal limits. No acute osseous abnormalities are seen. IMPRESSION: No  acute cardiopulmonary process seen. Electronically Signed   By: Roanna Raider M.D.   On: 07/24/2017 06:47     Medical Consultants:    None.  Anti-Infectives:   none  Subjective:    [name] had some coffee-ground emesis overnight, denies any abdominal pain.  Objective:    Vitals:   07/25/17 0430 07/25/17 0438 07/25/17  0500 07/25/17 0530  BP: (!) 191/72 (!) 180/80 (!) 181/71 (!) 121/35  Pulse:      Resp: 17 17 17 15   Temp:      TempSrc:      SpO2:      Weight:      Height:        Intake/Output Summary (Last 24 hours) at 07/25/17 0722 Last data filed at 07/25/17 0400  Gross per 24 hour  Intake          2238.75 ml  Output              800 ml  Net          1438.75 ml   Filed Weights   07/24/17 0800  Weight: 73.1 kg (161 lb 2.5 oz)    Exam: General exam: In no acute distress. Respiratory system: Good air movement and clear to auscultation. Cardiovascular system: S1 & S2 heard, RRR.  Gastrointestinal system: Mild epigastric tenderness, no rebound or guarding. Central nervous system: Alert and oriented. No focal neurological deficits. Extremities: No pedal edema. Skin: No rashes, lesions or ulcers Psychiatry: Judgement and insight appear normal. Mood & affect appropriate.    Data Reviewed:    Labs: Basic Metabolic Panel:  Recent Labs Lab 07/24/17 0054 07/24/17 0849 07/25/17 0359  NA 142 143 140  K 3.7 4.0 3.4*  CL 100* 104 103  CO2 21* 21* 23  GLUCOSE 377* 315* 197*  BUN 16 14 15   CREATININE 0.93 0.83 0.81  CALCIUM 10.7* 9.9 9.4   GFR Estimated Creatinine Clearance: 95.1 mL/min (by C-G formula based on SCr of 0.81 mg/dL). Liver Function Tests:  Recent Labs Lab 07/24/17 0054  AST 22  ALT 18  ALKPHOS 130*  BILITOT 0.9  PROT 9.6*  ALBUMIN 5.7*   No results for input(s): LIPASE, AMYLASE in the last 168 hours. No results for input(s): AMMONIA in the last 168 hours. Coagulation profile No results for input(s): INR, PROTIME in the last 168 hours.  CBC:  Recent Labs Lab 07/24/17 0054 07/24/17 0849 07/24/17 1310 07/24/17 1616  WBC 11.5* 15.7* 15.1* 15.3*  HGB 16.0 16.7 16.0 15.9  HCT 47.6 48.8 46.8 45.1  MCV 80.5 79.6 79.5 78.3  PLT 158 140* 171 153   Cardiac Enzymes:  Recent Labs Lab 07/24/17 1908  TROPONINI 0.05*   BNP (last 3 results) No results for  input(s): PROBNP in the last 8760 hours. CBG:  Recent Labs Lab 07/24/17 1232 07/24/17 1619 07/24/17 2000 07/24/17 2319 07/25/17 0326  GLUCAP 266* 266* 147* 155* 218*   D-Dimer: No results for input(s): DDIMER in the last 72 hours. Hgb A1c:  Recent Labs  07/24/17 0849  HGBA1C 10.4*   Lipid Profile: No results for input(s): CHOL, HDL, LDLCALC, TRIG, CHOLHDL, LDLDIRECT in the last 72 hours. Thyroid function studies: No results for input(s): TSH, T4TOTAL, T3FREE, THYROIDAB in the last 72 hours.  Invalid input(s): FREET3 Anemia work up: No results for input(s): VITAMINB12, FOLATE, FERRITIN, TIBC, IRON, RETICCTPCT in the last 72 hours. Sepsis Labs:  Recent Labs Lab 07/24/17 0054 07/24/17 4098 07/24/17 1310 07/24/17 1616 07/25/17 0359  WBC 11.5* 15.7* 15.1* 15.3*  --   LATICACIDVEN  --  3.5* 3.4*  --  2.3*   Microbiology Recent Results (from the past 240 hour(s))  MRSA PCR Screening     Status: None   Collection Time: 07/24/17 10:00 AM  Result Value Ref Range Status   MRSA by PCR NEGATIVE NEGATIVE Final    Comment:        The GeneXpert MRSA Assay (FDA approved for NASAL specimens only), is one component of a comprehensive MRSA colonization surveillance program. It is not intended to diagnose MRSA infection nor to guide or monitor treatment for MRSA infections.      Medications:   . enalaprilat  1.25 mg Intravenous Q6H  . insulin aspart  0-9 Units Subcutaneous Q4H  . mycophenolate  1,000 mg Oral BID  . [START ON 07/27/2017] pantoprazole  40 mg Intravenous Q12H  . tacrolimus  5 mg Oral BID   Continuous Infusions: . sodium chloride 100 mL/hr at 07/24/17 1600  . pantoprozole (PROTONIX) infusion 8 mg/hr (07/24/17 2108)      LOS: 0 days   Marinda ElkFELIZ ORTIZ, Delvis Kau  [facility] Pager [phone]  *Please refer to [email or web address], password [email or web address] to get updated schedule on who will round on this patient, as hospitalists switch teams weekly. If 7PM-7AM, please  contact night-coverage at [email or web address], password [email or web address] for any overnight needs.  07/25/2017, 7:22 AM

## 2017-07-25 NOTE — Progress Notes (Signed)
Mulitple interventions on [name] for elevated BP through out the night,  See MAR, patient currently resting, will continue to monitor.

## 2017-07-26 ENCOUNTER — Inpatient Hospital Stay (HOSPITAL_COMMUNITY): Payer: Medicare Other

## 2017-07-26 DIAGNOSIS — R0600 Dyspnea, unspecified: Secondary | ICD-10-CM

## 2017-07-26 LAB — BASIC METABOLIC PANEL
Anion gap: 12 (ref 5–15)
BUN: 19 mg/dL (ref 6–20)
CO2: 19 mmol/L — ABNORMAL LOW (ref 22–32)
Calcium: 9 mg/dL (ref 8.9–10.3)
Chloride: 101 mmol/L (ref 101–111)
Creatinine, Ser: 0.82 mg/dL (ref 0.61–1.24)
GFR calc Af Amer: >60 mL/min (ref >60)
GFR calc non Af Amer: >60 mL/min (ref >60)
Glucose, Bld: 202 mg/dL — ABNORMAL HIGH (ref 65–99)
Potassium: 3.8 mmol/L (ref 3.5–5.1)
Sodium: 132 mmol/L — ABNORMAL LOW (ref 135–145)

## 2017-07-26 LAB — GLUCOSE, CAPILLARY
Glucose-Capillary: 192 mg/dL — ABNORMAL HIGH (ref 65–99)
Glucose-Capillary: 185 mg/dL — ABNORMAL HIGH (ref 65–99)
Glucose-Capillary: 256 mg/dL — ABNORMAL HIGH (ref 65–99)
Glucose-Capillary: 148 mg/dL — ABNORMAL HIGH (ref 65–99)
Glucose-Capillary: 203 mg/dL — ABNORMAL HIGH (ref 65–99)

## 2017-07-26 LAB — ECHOCARDIOGRAM COMPLETE
Height: 70 in
Weight: 2578.5 oz

## 2017-07-26 MED ORDER — INSULIN ASPART 100 UNIT/ML ~~LOC~~ SOLN: 0.0000 [IU] | Freq: Three times a day (TID) | SUBCUTANEOUS | Status: DC

## 2017-07-26 MED ORDER — INSULIN DETEMIR 100 UNIT/ML ~~LOC~~ SOLN
5.0000 [IU] | Freq: Every day | SUBCUTANEOUS | Status: DC
  Filled 2017-07-26: qty 0.05

## 2017-07-26 MED ORDER — INSULIN DETEMIR 100 UNIT/ML ~~LOC~~ SOLN
10.0000 [IU] | Freq: Two times a day (BID) | SUBCUTANEOUS | Status: DC
  Administered 2017-07-26 (×2): 10 [IU] via SUBCUTANEOUS
  Filled 2017-07-26 (×3): qty 0.1

## 2017-07-26 MED ORDER — POTASSIUM CHLORIDE CRYS ER 20 MEQ PO TBCR
40.0000 meq | EXTENDED_RELEASE_TABLET | Freq: Two times a day (BID) | ORAL | Status: AC
  Administered 2017-07-26 (×2): 40 meq via ORAL
  Filled 2017-07-26 (×2): qty 2

## 2017-07-26 MED ORDER — INSULIN ASPART 100 UNIT/ML ~~LOC~~ SOLN
0.0000 [IU] | Freq: Three times a day (TID) | SUBCUTANEOUS | Status: DC
  Administered 2017-07-26: 2 [IU] via SUBCUTANEOUS
  Administered 2017-07-26: 8 [IU] via SUBCUTANEOUS
  Administered 2017-07-27 (×2): 5 [IU] via SUBCUTANEOUS
  Administered 2017-07-28 (×2): 3 [IU] via SUBCUTANEOUS

## 2017-07-26 MED ORDER — INSULIN ASPART 100 UNIT/ML ~~LOC~~ SOLN: 4.0000 [IU] | Freq: Three times a day (TID) | SUBCUTANEOUS | Status: DC

## 2017-07-26 MED ORDER — PANTOPRAZOLE SODIUM 40 MG PO TBEC
40.0000 mg | DELAYED_RELEASE_TABLET | Freq: Two times a day (BID) | ORAL | Status: DC
  Administered 2017-07-26 – 2017-07-28 (×4): 40 mg via ORAL
  Filled 2017-07-26 (×4): qty 1

## 2017-07-26 MED ORDER — LABETALOL HCL 100 MG PO TABS
100.0000 mg | ORAL_TABLET | Freq: Two times a day (BID) | ORAL | Status: DC
  Administered 2017-07-26 (×2): 100 mg via ORAL
  Filled 2017-07-26 (×2): qty 1

## 2017-07-26 MED ORDER — FUROSEMIDE 10 MG/ML IJ SOLN
40.0000 mg | Freq: Two times a day (BID) | INTRAMUSCULAR | Status: AC
  Administered 2017-07-26 (×2): 40 mg via INTRAVENOUS
  Filled 2017-07-26 (×2): qty 4

## 2017-07-26 MED ORDER — INSULIN ASPART 100 UNIT/ML ~~LOC~~ SOLN
0.0000 [IU] | Freq: Every day | SUBCUTANEOUS | Status: DC
  Administered 2017-07-26: 2 [IU] via SUBCUTANEOUS

## 2017-07-26 MED ORDER — INSULIN DETEMIR 100 UNIT/ML ~~LOC~~ SOLN: 5.0000 [IU] | Freq: Every day | SUBCUTANEOUS | Status: DC

## 2017-07-26 MED ORDER — FUROSEMIDE 20 MG PO TABS: 20.0000 mg | ORAL_TABLET | Freq: Two times a day (BID) | ORAL | Status: DC

## 2017-07-26 MED ORDER — TACROLIMUS 1 MG PO CAPS: 5.0000 mg | ORAL_CAPSULE | Freq: Two times a day (BID) | ORAL | Status: DC

## 2017-07-26 MED ORDER — INSULIN ASPART 100 UNIT/ML ~~LOC~~ SOLN
3.0000 [IU] | Freq: Three times a day (TID) | SUBCUTANEOUS | Status: DC
  Administered 2017-07-27 – 2017-07-28 (×4): 3 [IU] via SUBCUTANEOUS

## 2017-07-26 NOTE — Progress Notes (Signed)
  Echocardiogram 2D Echocardiogram has been performed.  [name] [name] 07/26/2017, 2:08 PM

## 2017-07-26 NOTE — Progress Notes (Addendum)
[facility] PROGRESS NOTE    Progress Note  Doss Cybulski  ZOX:[id number] DOB: April 14, 1951 DOA: 07/24/2017 PCP: Patient, No Pcp Per     Brief Narrative:   Tito Ausmus is an 66 y.o. male past medical history negative for pneumonia, diabetes mellitus2, peripheral vascular disease status post renal and pancreatic transplants was presents to [facility] long ED for nausea vomiting hematemesis, according to patient's wife she came home and found him vomiting, when she returned home later that day she relates she saw bright red blood on the floor and seems like some coffee-ground hematemesis, he denies any NSAIDs.  Assessment/Plan:   Hematemesis No episodes overnight, GI was consulted who recommended conservative management likely due to Mallory-Weiss tear. He denies any NSAIDs hold aspirin. Continue Protonix orally twice a day  Hypertensive urgency. The patient continues to be high, he seems to be fluid overloaded. We'll start him on oral labetalol continue Norvasc, will start on IV Lasix  Diabetes mellitus without complication (HCC) A1c was 10.4 which points towards noncompliance. We'll start him on low dose long-acting insulin continue sliding scale. As per care everywhere he was supposed to be on Actos but he has denied taking it.  Peripheral vascular disease (HCC) Hold aspirin.  S/P BKA (below knee amputation) unilateral (HCC) Stump is clean.  History of renal and pancreatic transplant: Continue current medication.   DVT prophylaxis: SCD Family Communication:none Disposition Plan/Barrier to D/C: home in 2-3 days Code Status:     Code Status Orders        Start     Ordered   07/24/17 0817  Full code  Continuous     07/24/17 0818    Code Status History    Date Active Date Inactive Code Status Order ID Comments User Context   02/22/2015  5:16 PM 03/05/2015  8:43 PM Full Code [id number]  Jacquelynn Cree, PA-C Inpatient   02/20/2015 11:40 AM 02/22/2015  5:16 PM Full  Code [id number]  Nadara Mustard, MD Inpatient   01/23/2015  2:27 PM 01/25/2015  7:00 PM Full Code [id number]  Nadara Mustard, MD Inpatient   01/21/2015 12:14 PM 01/23/2015  2:27 PM Full Code [id number]  Chuck Hint, MD Inpatient   01/13/2015  6:12 PM 01/21/2015 12:14 PM Full Code [id number]  Kathryne Hitch, MD Inpatient   01/13/2015  2:15 AM 01/13/2015  6:12 PM Full Code [id number]  Ron Parker, MD Inpatient        IV Access:    Peripheral IV   Procedures and diagnostic studies:   Dg Abd 1 View  Result Date: 07/24/2017 CLINICAL DATA:  Nausea and vomiting. Status post renal and pancreatic transplant with persistent nausea, vomiting and hematemesis. EXAM: ABDOMEN - 1 VIEW COMPARISON:  None. FINDINGS: Paucity of bowel gas within the abdomen and pelvis. Postsurgical sutures within the right abdomen. No evidence of free intraperitoneal air seen. No acute or suspicious osseous finding. IMPRESSION: No acute findings. Paucity of bowel gas limiting characterization of the bowel gas pattern. No dilated bowel loops appreciated. Electronically Signed   By: Bary Richard M.D.   On: 07/24/2017 09:33     Medical Consultants:    None.  Anti-Infectives:   none  Subjective:    [name] no events overnight tolerating his diet.  Objective:    Vitals:   07/26/17 0500 07/26/17 0600 07/26/17 0700 07/26/17 0800  BP: (!) 172/69 (!) 162/59 (!) 171/69 (!) 191/78  Pulse: 94 90 90 98  Resp: 15 14 14  16  Temp:      TempSrc:      SpO2: 99% 98% 98% 99%  Weight:      Height:        Intake/Output Summary (Last 24 hours) at 07/26/17 0826 Last data filed at 07/26/17 0818  Gross per 24 hour  Intake          2276.67 ml  Output             1750 ml  Net           526.67 ml   Filed Weights   07/24/17 0800  Weight: 73.1 kg (161 lb 2.5 oz)    Exam: General exam: In no acute distress. Respiratory system: Good air movement and clear to auscultation. Cardiovascular system:  S1 & S2 heard, RRR.  Gastrointestinal system: Mild epigastric tenderness, no rebound or guarding. Central nervous system: Alert and oriented. No focal neurological deficits. Extremities: No pedal edema. Skin: No rashes, lesions or ulcers Psychiatry: Judgement and insight appear normal. Mood & affect appropriate.    Data Reviewed:    Labs: Basic Metabolic Panel:  Recent Labs Lab 07/24/17 0054 07/24/17 0849 07/25/17 0359  NA 142 143 140  K 3.7 4.0 3.4*  CL 100* 104 103  CO2 21* 21* 23  GLUCOSE 377* 315* 197*  BUN 16 14 15   CREATININE 0.93 0.83 0.81  CALCIUM 10.7* 9.9 9.4   GFR Estimated Creatinine Clearance: 95.1 mL/min (by C-G formula based on SCr of 0.81 mg/dL). Liver Function Tests:  Recent Labs Lab 07/24/17 0054  AST 22  ALT 18  ALKPHOS 130*  BILITOT 0.9  PROT 9.6*  ALBUMIN 5.7*   No results for input(s): LIPASE, AMYLASE in the last 168 hours. No results for input(s): AMMONIA in the last 168 hours. Coagulation profile No results for input(s): INR, PROTIME in the last 168 hours.  CBC:  Recent Labs Lab 07/24/17 0054 07/24/17 0849 07/24/17 1310 07/24/17 1616 07/25/17 0812  WBC 11.5* 15.7* 15.1* 15.3* 12.8*  NEUTROABS  --   --   --   --  10.7*  HGB 16.0 16.7 16.0 15.9 14.4  HCT 47.6 48.8 46.8 45.1 42.9  MCV 80.5 79.6 79.5 78.3 80.0  PLT 158 140* 171 153 142*   Cardiac Enzymes:  Recent Labs Lab 07/24/17 1908  TROPONINI 0.05*   BNP (last 3 results) No results for input(s): PROBNP in the last 8760 hours. CBG:  Recent Labs Lab 07/25/17 1716 07/25/17 1954 07/25/17 2347 07/26/17 0314 07/26/17 0822  GLUCAP 199* 206* 149* 192* 185*   D-Dimer: No results for input(s): DDIMER in the last 72 hours. Hgb A1c:  Recent Labs  07/24/17 0849  HGBA1C 10.4*   Lipid Profile: No results for input(s): CHOL, HDL, LDLCALC, TRIG, CHOLHDL, LDLDIRECT in the last 72 hours. Thyroid function studies: No results for input(s): TSH, T4TOTAL, T3FREE,  THYROIDAB in the last 72 hours.  Invalid input(s): FREET3 Anemia work up: No results for input(s): VITAMINB12, FOLATE, FERRITIN, TIBC, IRON, RETICCTPCT in the last 72 hours. Sepsis Labs:  Recent Labs Lab 07/24/17 0849 07/24/17 1310 07/24/17 1616 07/25/17 0359 07/25/17 0812  WBC 15.7* 15.1* 15.3*  --  12.8*  LATICACIDVEN 3.5* 3.4*  --  2.3*  --    Microbiology Recent Results (from the past 240 hour(s))  MRSA PCR Screening     Status: None   Collection Time: 07/24/17 10:00 AM  Result Value Ref Range Status   MRSA by PCR NEGATIVE NEGATIVE Final  Comment:        The GeneXpert MRSA Assay (FDA approved for NASAL specimens only), is one component of a comprehensive MRSA colonization surveillance program. It is not intended to diagnose MRSA infection nor to guide or monitor treatment for MRSA infections.      Medications:   . amLODipine  10 mg Oral Daily  . furosemide  40 mg Intravenous Q12H  . [START ON 07/27/2017] furosemide  20 mg Oral BID  . insulin aspart  0-15 Units Subcutaneous TID WC  . insulin aspart  0-5 Units Subcutaneous QHS  . insulin aspart  3 Units Subcutaneous TID WC  . insulin detemir  5 Units Subcutaneous QHS  . labetalol  100 mg Oral BID  . mycophenolate  1,000 mg Oral BID  . pantoprazole  40 mg Oral QAC breakfast  . tacrolimus  5 mg Oral BID   Continuous Infusions:     LOS: 1 day   Marinda Elk  [facility] Pager [phone]  *Please refer to [email or web address], password [email or web address] to get updated schedule on who will round on this patient, as hospitalists switch teams weekly. If 7PM-7AM, please contact night-coverage at [email or web address], password [email or web address] for any overnight needs.  07/26/2017, 8:26 AM

## 2017-07-26 NOTE — Care Management Note (Signed)
Case Management Note  Patient Details  Name: [name] MRN: [id number] Date of Birth: [birth year]  Subjective/Objective:                  hematesis  Action/Plan: Date:  July 26 2017 Chart reviewed for concurrent status and case management needs.  Will continue to follow patient progress.  Discharge Planning: following for needs  Expected discharge date: July 29, 2017  [name], BSN, [place]3, [place]M   [phone]   Expected Discharge Date:                  Expected Discharge Plan:  Home/Self Care  In-House Referral:     Discharge planning Services  CM Consult  Post Acute Care Choice:    Choice offered to:     DME Arranged:    DME Agency:     HH Arranged:    HH Agency:     Status of Service:  In process, will continue to follow  If discussed at Long Length of Stay Meetings, dates discussed:    Additional Comments:  Golda AcreDavis, Doris Mcgilvery Lynn, RN 07/26/2017, 9:22 AM

## 2017-07-26 NOTE — Progress Notes (Signed)
[facility] PROGRESS NOTE    Progress Note  [name]  [id number] DOB: 27-Jun-1951 DOA: 07/24/2017 PCP: Patient, No Pcp Per     Brief Narrative:   [name] is an 66 y.o. male past medical history negative for pneumonia, diabetes mellitus2, peripheral vascular disease status post renal and pancreatic transplants was [facility] for nausea vomiting hematemesis, according to patient's wife she came home and found him vomiting, when she returned home later that day she relates she saw bright red blood on the floor and seems like some coffee-ground hematemesis, he denies any NSAIDs.  Assessment/Plan:   Hematemesis gI was consulted who recommended no further intervention hemoglobin stable no further episodes of emesis.ikely Mallory-Weiss tear Continue IV Zofran for nausea. CBC is morning is pending. He denies any NSAIDs hold aspirin  Hypertensive urgency. Start oral labetalol and oral Lasix continue Bumex daily. Resume his home dose of Norvasc. Has JVD on physical exam. Check a 2-D echo.follow strict I's and O's  Diabetes mellitus without complication (HCC) A1c was 10.4 which points towards noncompliance, blood glucose is improving with sliding scale.  Peripheral vascular disease (HCC) Hold aspirin.  S/P BKA (below knee amputation) unilateral (HCC) Stump is clean.  History of renal and pancreatic transplant: Continue current medication. Check a tacrolimus and CellCept level   DVT prophylaxis: SCD Family Communication:none Disposition Plan/Barrier to D/C: home in 2-3 days Code Status:     Code Status Orders        Start     Ordered   07/24/17 0817  Full code  Continuous     07/24/17 0818    Code Status History    Date Active Date Inactive Code Status Order ID Comments User Context   02/22/2015  5:16 PM 03/05/2015  8:43 PM Full Code [id number]  Jacquelynn CreeLove, Pamela S, PA-C Inpatient   02/20/2015 11:40 AM 02/22/2015  5:16 PM Full Code [id number]   Nadara Mustarduda, Marcus V, MD Inpatient   01/23/2015  2:27 PM 01/25/2015  7:00 PM Full Code [id number]  Nadara Mustarduda, Marcus V, MD Inpatient   01/21/2015 12:14 PM 01/23/2015  2:27 PM Full Code [id number]  Chuck Hintickson, Christopher S, MD Inpatient   01/13/2015  6:12 PM 01/21/2015 12:14 PM Full Code [id number]  Kathryne HitchBlackman, Christopher Y, MD Inpatient   01/13/2015  2:15 AM 01/13/2015  6:12 PM Full Code [id number]  Ron ParkerJenkins, Harvette C, MD Inpatient        IV Access:    Peripheral IV   Procedures and diagnostic studies:   Dg Abd 1 View  Result Date: 07/24/2017 CLINICAL DATA:  Nausea and vomiting. Status post renal and pancreatic transplant with persistent nausea, vomiting and hematemesis. EXAM: ABDOMEN - 1 VIEW COMPARISON:  None. FINDINGS: Paucity of bowel gas within the abdomen and pelvis. Postsurgical sutures within the right abdomen. No evidence of free intraperitoneal air seen. No acute or suspicious osseous finding. IMPRESSION: No acute findings. Paucity of bowel gas limiting characterization of the bowel gas pattern. No dilated bowel loops appreciated. Electronically Signed   By: Bary RichardStan  Maynard M.D.   On: 07/24/2017 09:33     Medical Consultants:    None.  Anti-Infectives:   none  Subjective:    [name] denies abdominal pain and coffee-ground emesis.  Objective:    Vitals:   07/26/17 0420 07/26/17 0500 07/26/17 0600 07/26/17 0700  BP:  (!) 172/69 (!) 162/59 (!) 171/69  Pulse:  94 90 90  Resp:  15 14 14   Temp: 98 F (36.7 C)  TempSrc: Oral     SpO2:  99% 98% 98%  Weight:      Height:        Intake/Output Summary (Last 24 hours) at 07/26/17 0803 Last data filed at 07/26/17 0700  Gross per 24 hour  Intake          2179.17 ml  Output             1750 ml  Net           429.17 ml   Filed Weights   07/24/17 0800  Weight: 73.1 kg (161 lb 2.5 oz)    Exam: General exam: In no acute distress. Respiratory system: Good air movement and clear to auscultation.positive JVD Cardiovascular  system: S1 & S2 heard, RRR.  Gastrointestinal system: Mild epigastric tenderness, no rebound or guarding. Central nervous system: Alert and oriented. No focal neurological deficits. Extremities: No pedal edema. Skin: No rashes, lesions or ulcers Psychiatry: Judgement and insight appear normal. Mood & affect appropriate.    Data Reviewed:    Labs: Basic Metabolic Panel:  Recent Labs Lab 07/24/17 0054 07/24/17 0849 07/25/17 0359  NA 142 143 140  K 3.7 4.0 3.4*  CL 100* 104 103  CO2 21* 21* 23  GLUCOSE 377* 315* 197*  BUN 16 14 15   CREATININE 0.93 0.83 0.81  CALCIUM 10.7* 9.9 9.4   GFR Estimated Creatinine Clearance: 95.1 mL/min (by C-G formula based on SCr of 0.81 mg/dL). Liver Function Tests:  Recent Labs Lab 07/24/17 0054  AST 22  ALT 18  ALKPHOS 130*  BILITOT 0.9  PROT 9.6*  ALBUMIN 5.7*   No results for input(s): LIPASE, AMYLASE in the last 168 hours. No results for input(s): AMMONIA in the last 168 hours. Coagulation profile No results for input(s): INR, PROTIME in the last 168 hours.  CBC:  Recent Labs Lab 07/24/17 0054 07/24/17 0849 07/24/17 1310 07/24/17 1616 07/25/17 0812  WBC 11.5* 15.7* 15.1* 15.3* 12.8*  NEUTROABS  --   --   --   --  10.7*  HGB 16.0 16.7 16.0 15.9 14.4  HCT 47.6 48.8 46.8 45.1 42.9  MCV 80.5 79.6 79.5 78.3 80.0  PLT 158 140* 171 153 142*   Cardiac Enzymes:  Recent Labs Lab 07/24/17 1908  TROPONINI 0.05*   BNP (last 3 results) No results for input(s): PROBNP in the last 8760 hours. CBG:  Recent Labs Lab 07/25/17 1303 07/25/17 1716 07/25/17 1954 07/25/17 2347 07/26/17 0314  GLUCAP 182* 199* 206* 149* 192*   D-Dimer: No results for input(s): DDIMER in the last 72 hours. Hgb A1c:  Recent Labs  07/24/17 0849  HGBA1C 10.4*   Lipid Profile: No results for input(s): CHOL, HDL, LDLCALC, TRIG, CHOLHDL, LDLDIRECT in the last 72 hours. Thyroid function studies: No results for input(s): TSH, T4TOTAL, T3FREE,  THYROIDAB in the last 72 hours.  Invalid input(s): FREET3 Anemia work up: No results for input(s): VITAMINB12, FOLATE, FERRITIN, TIBC, IRON, RETICCTPCT in the last 72 hours. Sepsis Labs:  Recent Labs Lab 07/24/17 0849 07/24/17 1310 07/24/17 1616 07/25/17 0359 07/25/17 0812  WBC 15.7* 15.1* 15.3*  --  12.8*  LATICACIDVEN 3.5* 3.4*  --  2.3*  --    Microbiology Recent Results (from the past 240 hour(s))  MRSA PCR Screening     Status: None   Collection Time: 07/24/17 10:00 AM  Result Value Ref Range Status   MRSA by PCR NEGATIVE NEGATIVE Final    Comment:  The GeneXpert MRSA Assay (FDA approved for NASAL specimens only), is one component of a comprehensive MRSA colonization surveillance program. It is not intended to diagnose MRSA infection nor to guide or monitor treatment for MRSA infections.      Medications:   . amLODipine  10 mg Oral Daily  . insulin aspart  0-9 Units Subcutaneous Q4H  . mycophenolate  1,000 mg Oral BID  . pantoprazole  40 mg Oral QAC breakfast  . tacrolimus  5 mg Oral BID   Continuous Infusions:     LOS: 1 day   Marinda Elk  [facility] Pager [phone]  *Please refer to [email or web address], password [email or web address] to get updated schedule on who will round on this patient, as hospitalists switch teams weekly. If 7PM-7AM, please contact night-coverage at [email or web address], password [email or web address] for any overnight needs.  07/26/2017, 8:03 AM

## 2017-07-27 LAB — GLUCOSE, CAPILLARY
Glucose-Capillary: 220 mg/dL — ABNORMAL HIGH (ref 65–99)
Glucose-Capillary: 215 mg/dL — ABNORMAL HIGH (ref 65–99)
Glucose-Capillary: 105 mg/dL — ABNORMAL HIGH (ref 65–99)
Glucose-Capillary: 101 mg/dL — ABNORMAL HIGH (ref 65–99)

## 2017-07-27 LAB — CBC WITH DIFFERENTIAL/PLATELET
Basophils Absolute: 0 10*3/uL (ref 0.0–0.1)
Basophils Relative: 0 %
Eosinophils Absolute: 0 10*3/uL (ref 0.0–0.7)
Eosinophils Relative: 0 %
HCT: 44.7 % (ref 39.0–52.0)
Hemoglobin: 15.1 g/dL (ref 13.0–17.0)
Lymphocytes Relative: 13 %
Lymphs Abs: 1.6 10*3/uL (ref 0.7–4.0)
MCH: 26.6 pg (ref 26.0–34.0)
MCHC: 33.8 g/dL (ref 30.0–36.0)
MCV: 78.8 fL (ref 78.0–100.0)
Monocytes Absolute: 0.8 10*3/uL (ref 0.1–1.0)
Monocytes Relative: 7 %
Neutro Abs: 9.7 10*3/uL — ABNORMAL HIGH (ref 1.7–7.7)
Neutrophils Relative %: 80 %
Platelets: 143 10*3/uL — ABNORMAL LOW (ref 150–400)
RBC: 5.67 MIL/uL (ref 4.22–5.81)
RDW: 14 % (ref 11.5–15.5)
WBC: 12.1 10*3/uL — ABNORMAL HIGH (ref 4.0–10.5)

## 2017-07-27 LAB — BASIC METABOLIC PANEL
Anion gap: 15 (ref 5–15)
BUN: 27 mg/dL — ABNORMAL HIGH (ref 6–20)
CO2: 20 mmol/L — ABNORMAL LOW (ref 22–32)
Calcium: 9.2 mg/dL (ref 8.9–10.3)
Chloride: 100 mmol/L — ABNORMAL LOW (ref 101–111)
Creatinine, Ser: 0.9 mg/dL (ref 0.61–1.24)
GFR calc Af Amer: >60 mL/min (ref >60)
GFR calc non Af Amer: >60 mL/min (ref >60)
Glucose, Bld: 193 mg/dL — ABNORMAL HIGH (ref 65–99)
Potassium: 4.2 mmol/L (ref 3.5–5.1)
Sodium: 135 mmol/L (ref 135–145)

## 2017-07-27 LAB — TACROLIMUS LEVEL: Tacrolimus (FK506) - LabCorp: 20.9 ng/mL — ABNORMAL HIGH (ref 2.0–20.0)

## 2017-07-27 MED ORDER — INSULIN DETEMIR 100 UNIT/ML ~~LOC~~ SOLN
15.0000 [IU] | Freq: Two times a day (BID) | SUBCUTANEOUS | Status: DC
  Administered 2017-07-27 – 2017-07-28 (×3): 15 [IU] via SUBCUTANEOUS
  Filled 2017-07-27 (×5): qty 0.15

## 2017-07-27 MED ORDER — FUROSEMIDE 20 MG PO TABS
20.0000 mg | ORAL_TABLET | Freq: Two times a day (BID) | ORAL | Status: DC
  Administered 2017-07-28: 20 mg via ORAL
  Filled 2017-07-27: qty 1

## 2017-07-27 MED ORDER — LISINOPRIL 5 MG PO TABS
5.0000 mg | ORAL_TABLET | Freq: Every day | ORAL | Status: DC
  Administered 2017-07-27 – 2017-07-28 (×2): 5 mg via ORAL
  Filled 2017-07-27: qty 1
  Filled 2017-07-27: qty 2

## 2017-07-27 MED ORDER — FUROSEMIDE 10 MG/ML IJ SOLN
40.0000 mg | Freq: Two times a day (BID) | INTRAMUSCULAR | Status: DC
  Administered 2017-07-27: 40 mg via INTRAVENOUS
  Filled 2017-07-27: qty 4

## 2017-07-27 MED ORDER — ATORVASTATIN CALCIUM 40 MG PO TABS
80.0000 mg | ORAL_TABLET | Freq: Every day | ORAL | Status: DC
  Administered 2017-07-27: 80 mg via ORAL
  Filled 2017-07-27: qty 2

## 2017-07-27 MED ORDER — POTASSIUM CHLORIDE CRYS ER 20 MEQ PO TBCR
40.0000 meq | EXTENDED_RELEASE_TABLET | Freq: Two times a day (BID) | ORAL | Status: AC
  Administered 2017-07-27 (×2): 40 meq via ORAL
  Filled 2017-07-27 (×2): qty 2

## 2017-07-27 MED ORDER — LABETALOL HCL 200 MG PO TABS
200.0000 mg | ORAL_TABLET | Freq: Two times a day (BID) | ORAL | Status: DC
  Administered 2017-07-27 – 2017-07-28 (×3): 200 mg via ORAL
  Filled 2017-07-27 (×3): qty 1

## 2017-07-27 NOTE — Progress Notes (Signed)
[facility] PROGRESS NOTE    Progress Note  [name] DikeClarence [name]  ZOX:[id number] DOB: Sep 08, 1951 DOA: 07/24/2017 PCP: Patient, No Pcp Per     Brief Narrative:   [name] DikeClarence [name] is an 66 y.o. male past medical history negative for pneumonia, diabetes mellitus2, peripheral vascular disease status post renal and pancreatic transplants was presents to [facility] ED for nausea vomiting hematemesis, according to patient's wife she came home and found him vomiting, when she returned home later that day she relates she saw bright red blood on the floor and seems like some coffee-ground hematemesis, he denies any NSAIDs.  Assessment/Plan:   Hematemesis No episodes overnight, GI was consulted who recommended conservative management likely due to Mallory-Weiss tear. He denies any NSAIDs hold aspirin. Continue Protonix orally twice a day  Hypertensive urgency. BP improved today, he cont to be fluid overloaded. Cont IV lasix, replete, increase labetalol. Start low dose lisinopril.  Diabetes mellitus without complication (HCC) A1c was 10.4 which points towards noncompliance. Increase long acting insulin He denies taking Actos.  Peripheral vascular disease (HCC) Hold aspirin.  S/P BKA (below knee amputation) unilateral (HCC) Stump is clean.  History of renal and pancreatic transplant: Continue current medication. Tacrolimus and prograf levels are pending.   DVT prophylaxis: SCD Family Communication:none Disposition Plan/Barrier to D/C: transfer to SDU. Code Status:     Code Status Orders        Start     Ordered   07/24/17 0817  Full code  Continuous     07/24/17 0818    Code Status History    Date Active Date Inactive Code Status Order ID Comments User Context   02/22/2015  5:16 PM 03/05/2015  8:43 PM Full Code [id number]  Jacquelynn CreeLove, Pamela S, PA-C Inpatient   02/20/2015 11:40 AM 02/22/2015  5:16 PM Full Code [id number]  Nadara Mustarduda, Marcus V, MD Inpatient   01/23/2015  2:27 PM 01/25/2015   7:00 PM Full Code [id number]  Nadara Mustarduda, Marcus V, MD Inpatient   01/21/2015 12:14 PM 01/23/2015  2:27 PM Full Code [id number]  Chuck Hintickson, Christopher S, MD Inpatient   01/13/2015  6:12 PM 01/21/2015 12:14 PM Full Code [id number]  Kathryne HitchBlackman, Christopher Y, MD Inpatient   01/13/2015  2:15 AM 01/13/2015  6:12 PM Full Code [id number]  Ron ParkerJenkins, Harvette C, MD Inpatient        IV Access:    Peripheral IV   Procedures and diagnostic studies:   No results found.   Medical Consultants:    None.  Anti-Infectives:   none  Subjective:    [name] Dikelarence [name] no events overnight tolerating his diet.  Objective:    Vitals:   07/26/17 2018 07/26/17 2300 07/27/17 0318 07/27/17 0418  BP: (!) 157/58   (!) 136/59  Pulse: 97 97  90  Resp: 17 (!) 21  14  Temp:  97.7 F (36.5 C) 97.8 F (36.6 C)   TempSrc:  Oral Oral   SpO2: 97% 100%  98%  Weight:      Height:        Intake/Output Summary (Last 24 hours) at 07/27/17 0725 Last data filed at 07/27/17 0111  Gross per 24 hour  Intake            497.5 ml  Output             2100 ml  Net          -1602.5 ml   Filed Weights   07/24/17 0800  Weight: 73.1 kg (161 lb 2.5  oz)    Exam: General exam: In no acute distress. Respiratory system: Good air movement and clear to auscultation. Cardiovascular system: S1 & S2 heard, RRR. +JVD Gastrointestinal system: Mild epigastric tenderness, no rebound or guarding. Central nervous system: Alert and oriented. No focal neurological deficits. Extremities: No pedal edema. Skin: No rashes, lesions or ulcers Psychiatry: Judgement and insight appear normal. Mood & affect appropriate.    Data Reviewed:    Labs: Basic Metabolic Panel:  Recent Labs Lab 07/24/17 0054 07/24/17 0849 07/25/17 0359 07/26/17 0851 07/27/17 0312  NA 142 143 140 132* 135  K 3.7 4.0 3.4* 3.8 4.2  CL 100* 104 103 101 100*  CO2 21* 21* 23 19* 20*  GLUCOSE 377* 315* 197* 202* 193*  BUN 16 14 15 19  27*  CREATININE 0.93 0.83 0.81  0.82 0.90  CALCIUM 10.7* 9.9 9.4 9.0 9.2   GFR Estimated Creatinine Clearance: 85.6 mL/min (by C-G formula based on SCr of 0.9 mg/dL). Liver Function Tests:  Recent Labs Lab 07/24/17 0054  AST 22  ALT 18  ALKPHOS 130*  BILITOT 0.9  PROT 9.6*  ALBUMIN 5.7*   No results for input(s): LIPASE, AMYLASE in the last 168 hours. No results for input(s): AMMONIA in the last 168 hours. Coagulation profile No results for input(s): INR, PROTIME in the last 168 hours.  CBC:  Recent Labs Lab 07/24/17 0849 07/24/17 1310 07/24/17 1616 07/25/17 0812 07/27/17 0312  WBC 15.7* 15.1* 15.3* 12.8* 12.1*  NEUTROABS  --   --   --  10.7* 9.7*  HGB 16.7 16.0 15.9 14.4 15.1  HCT 48.8 46.8 45.1 42.9 44.7  MCV 79.6 79.5 78.3 80.0 78.8  PLT 140* 171 153 142* 143*   Cardiac Enzymes:  Recent Labs Lab 07/24/17 1908  TROPONINI 0.05*   BNP (last 3 results) No results for input(s): PROBNP in the last 8760 hours. CBG:  Recent Labs Lab 07/26/17 0314 07/26/17 0822 07/26/17 1158 07/26/17 1609 07/26/17 2106  GLUCAP 192* 185* 256* 148* 203*   D-Dimer: No results for input(s): DDIMER in the last 72 hours. Hgb A1c:  Recent Labs  07/24/17 0849  HGBA1C 10.4*   Lipid Profile: No results for input(s): CHOL, HDL, LDLCALC, TRIG, CHOLHDL, LDLDIRECT in the last 72 hours. Thyroid function studies: No results for input(s): TSH, T4TOTAL, T3FREE, THYROIDAB in the last 72 hours.  Invalid input(s): FREET3 Anemia work up: No results for input(s): VITAMINB12, FOLATE, FERRITIN, TIBC, IRON, RETICCTPCT in the last 72 hours. Sepsis Labs:  Recent Labs Lab 07/24/17 0849 07/24/17 1310 07/24/17 1616 07/25/17 0359 07/25/17 0812 07/27/17 0312  WBC 15.7* 15.1* 15.3*  --  12.8* 12.1*  LATICACIDVEN 3.5* 3.4*  --  2.3*  --   --    Microbiology Recent Results (from the past 240 hour(s))  MRSA PCR Screening     Status: None   Collection Time: 07/24/17 10:00 AM  Result Value Ref Range Status   MRSA by  PCR NEGATIVE NEGATIVE Final    Comment:        The GeneXpert MRSA Assay (FDA approved for NASAL specimens only), is one component of a comprehensive MRSA colonization surveillance program. It is not intended to diagnose MRSA infection nor to guide or monitor treatment for MRSA infections.      Medications:   . amLODipine  10 mg Oral Daily  . furosemide  20 mg Oral BID  . insulin aspart  0-15 Units Subcutaneous TID WC  . insulin aspart  0-5 Units Subcutaneous QHS  .  insulin aspart  3 Units Subcutaneous TID WC  . insulin detemir  15 Units Subcutaneous BID  . labetalol  100 mg Oral BID  . mycophenolate  1,000 mg Oral BID  . pantoprazole  40 mg Oral BID  . tacrolimus  5 mg Oral BID   Continuous Infusions:     LOS: 2 days   Marinda Elk  [facility] Pager [phone]  *Please refer to [email or web address], password [email or web address] to get updated schedule on who will round on this patient, as hospitalists switch teams weekly. If 7PM-7AM, please contact night-coverage at [email or web address], password [email or web address] for any overnight needs.  07/27/2017, 7:25 AM

## 2017-07-27 NOTE — Progress Notes (Signed)
PT Cancellation Note  Patient Details Name: Ascencion DikeClarence Kubitz MRN: [id number] DOB: 02-21-1951   Cancelled Treatment:    Reason Eval/Treat Not Completed: Medical issues which prohibited therapy. Patient states that he cannot get up, doesn't know if he'll throw up. Check back another time   Sharen HeckHill, Jayce Boyko Elizabeth Hartley Wyke PT [phone]  07/27/2017, 1:50 PM

## 2017-07-28 LAB — POTASSIUM: Potassium: 4.8 mmol/L (ref 3.5–5.1)

## 2017-07-28 LAB — CBC WITH DIFFERENTIAL/PLATELET
Basophils Absolute: 0 10*3/uL (ref 0.0–0.1)
Basophils Relative: 0 %
Eosinophils Absolute: 0 10*3/uL (ref 0.0–0.7)
Eosinophils Relative: 0 %
HCT: 42.4 % (ref 39.0–52.0)
Hemoglobin: 14.6 g/dL (ref 13.0–17.0)
Lymphocytes Relative: 24 %
Lymphs Abs: 1.9 10*3/uL (ref 0.7–4.0)
MCH: 27.1 pg (ref 26.0–34.0)
MCHC: 34.4 g/dL (ref 30.0–36.0)
MCV: 78.8 fL (ref 78.0–100.0)
Monocytes Absolute: 0.6 10*3/uL (ref 0.1–1.0)
Monocytes Relative: 8 %
Neutro Abs: 5.5 10*3/uL (ref 1.7–7.7)
Neutrophils Relative %: 68 %
Platelets: 110 10*3/uL — ABNORMAL LOW (ref 150–400)
RBC: 5.38 MIL/uL (ref 4.22–5.81)
RDW: 13.8 % (ref 11.5–15.5)
WBC: 8 10*3/uL (ref 4.0–10.5)

## 2017-07-28 LAB — BASIC METABOLIC PANEL
Anion gap: 9 (ref 5–15)
BUN: 28 mg/dL — ABNORMAL HIGH (ref 6–20)
CO2: 21 mmol/L — ABNORMAL LOW (ref 22–32)
Calcium: 9.2 mg/dL (ref 8.9–10.3)
Chloride: 101 mmol/L (ref 101–111)
Creatinine, Ser: 0.92 mg/dL (ref 0.61–1.24)
GFR calc Af Amer: >60 mL/min (ref >60)
GFR calc non Af Amer: >60 mL/min (ref >60)
Glucose, Bld: 153 mg/dL — ABNORMAL HIGH (ref 65–99)
Potassium: 5.3 mmol/L — ABNORMAL HIGH (ref 3.5–5.1)
Sodium: 131 mmol/L — ABNORMAL LOW (ref 135–145)

## 2017-07-28 LAB — GLUCOSE, CAPILLARY
Glucose-Capillary: 151 mg/dL — ABNORMAL HIGH (ref 65–99)
Glucose-Capillary: 165 mg/dL — ABNORMAL HIGH (ref 65–99)

## 2017-07-28 MED ORDER — INSULIN DETEMIR 100 UNIT/ML ~~LOC~~ SOLN: 15.0000 [IU] | Freq: Two times a day (BID) | SUBCUTANEOUS | 11 refills | Status: DC

## 2017-07-28 MED ORDER — PANTOPRAZOLE SODIUM 40 MG PO TBEC: 40.0000 mg | DELAYED_RELEASE_TABLET | Freq: Every day | ORAL | 0 refills | Status: DC

## 2017-07-28 MED ORDER — LABETALOL HCL 100 MG PO TABS: 200.0000 mg | ORAL_TABLET | Freq: Two times a day (BID) | ORAL | 0 refills | Status: DC

## 2017-07-28 MED ORDER — FUROSEMIDE 20 MG PO TABS: 20.0000 mg | ORAL_TABLET | Freq: Every day | ORAL | 2 refills | Status: DC

## 2017-07-28 MED ORDER — LISINOPRIL 5 MG PO TABS: 5.0000 mg | ORAL_TABLET | Freq: Every day | ORAL | 0 refills | Status: DC

## 2017-07-28 NOTE — Discharge Instructions (Signed)
Insulin Treatment for Diabetes °Diabetes (diabetes mellitus) is a long-term (chronic) disease. It occurs when the body does not properly use sugar (glucose) that is released from food after digestion. Glucose levels are controlled by a hormone called insulin, which is made in the pancreas. °· If you have type 1 diabetes, the pancreas does not make any insulin, so you must take insulin. °· If you have type 2 diabetes, you might need to take insulin along with other medicines. In type 2 diabetes, one or both of these problems may be present: °? The pancreas does not make enough insulin. °? Cells in the body do not respond properly to insulin that the body makes (insulin resistance). ° °You must use insulin correctly to control your diabetes. You must have some insulin in your body at all times. Insulin treatment varies depending on your type of diabetes, your treatment goals, and your medical history. It is important for you to understand your insulin treatment plan so you can be an active partner in managing your diabetes. °How is insulin given? °Insulin can only be given through a shot (injection). It is injected using a syringe and needle, an insulin pen, a pump, or a jet injector. Your health care provider will: °· Prescribe the amount and type of insulin that you need. °· Tell you when you should inject your insulin. ° °Where on the body should insulin be injected? °Insulin is injected into a layer of fatty tissue under the skin. Good places to inject insulin include: °· Abdomen. Generally, the abdomen is the best place to inject insulin. However, you should avoid any area that is less than 2 inches (5 cm) from the belly button (navel). °· Front and outer area of the upper thighs. °· The back of the upper arms. °· Upper buttocks. ° °It is important to: °· Give your injection in a slightly different place each time. This helps to prevent irritation and improve absorption. °· Avoid injecting into areas that have  scar tissue. ° °Usually, you will give yourself insulin injections. Others can also be taught how to give you injections. You will use a special type of syringe that is made only for insulin. Some people may have an insulin pump that delivers insulin steadily through a tube (cannula) that is placed under the skin. °What are the different types of insulin? °The following information is a general guide to different types of insulin. Specifics vary depending on the insulin product that your health care provider prescribes. °· Rapid-acting insulin: °? Starts working quickly, in as little as 5 minutes. °? Can last for 4-6 hours, or sometimes longer. °? Works well when taken right before a meal to quickly lower blood glucose. °· Short-acting insulin: °? Starts working in about 30 minutes. °? Can last for 6-10 hours. °? Should be taken about 30 minutes before you start eating a meal. °· Intermediate-acting insulin: °? Starts working in 1-2 hours. °? Lasts for about 10-18 hours. °? Lowers your blood glucose for a longer period of time but is not as effective for lowering blood glucose right after a meal. °· Long-acting insulin: °? Mimics the small amount of insulin that your pancreas usually produces throughout the day. °? Should be used either one or two times a day. °? Is usually used in combination with other types of insulin or other medicines. °· Concentrated insulin, or U-500 insulin: °? Contains a higher dose of insulin than most rapid-acting insulins. U-500 insulin has 5 times the amount   of insulin per 1 mL. °? Should only be used with the special U-500 syringe or U-500 insulin pen. It is dangerous to use the wrong type of syringe with this insulin. ° °What are the side effects of insulin? °Possible side effects of insulin treatment include: °· Low blood glucose (hypoglycemia). °· Weight gain. °· High blood glucose (hyperglycemia). °· Skin injury or irritation. ° °Some of these side effects can be caused by using  improper injection technique. It is important to learn to inject insulin properly. °What are common terms associated with insulin treatment? °Some terms that you might hear include: °· Basal insulin, or basal rate. This is the constant amount of insulin that needs to be present in your body to stabilize your blood glucose levels. People who have type 1 diabetes need basal insulin in a steady (continuous) dose 24 hours a day. °? Usually, intermediate-acting or long-acting insulin is used one or two times a day to manage basal insulin levels. °? Medicines that are taken by mouth may also be recommended to manage basal insulin levels. °· Prandial insulin. This refers to meal-related insulin. °? Blood glucose rises quickly after a meal (postprandial). Rapid-acting or short-acting insulin can be used right before a meal (preprandial) to quickly lower blood glucose. °? You may be instructed to adjust the amount of prandial insulin that you take depending on how much carbohydrate (starch) is in your meal. °· Corrective insulin. This may also be called a correction dose or supplemental dose. This is a small amount of rapid-acting or short-acting insulin that can be used to lower blood glucose if it is too high. You may be instructed to check your blood glucose at certain times of the day and use corrective insulin as needed. °· Tight control, or intensive therapy. This means keeping your blood glucose as close to your target as possible, and preventing it from getting too high after meals. People who have tight control of their diabetes have fewer long-term problems caused by diabetes. ° °General instructions ° °Talk with your health care provider or pharmacist about the type of insulin you should take and when you should take it. You should know when your insulin peaks and when it wears off. You need this information so you can plan your meals and exercise. You also need to work with your health care provider to: °· Check  your blood glucose every day. Your health care provider will tell you how often and when you should do this. °· Manage your: °? Weight. °? Blood pressure. °? Cholesterol. °? Stress. °· Eat a healthy diet. °· Exercise regularly. ° °This information is not intended to replace advice given to you by your health care provider. Make sure you discuss any questions you have with your health care provider. °Document Released: 12/11/2008 Document Revised: 02/20/2016 Document Reviewed: 10/18/2015 °Elsevier Interactive Patient Education © 2018 [employer] ° °

## 2017-07-28 NOTE — Progress Notes (Signed)
PT Cancellation Note  Patient Details Name: [name] DikeClarence [name] MRN: [id number] DOB: 1951/06/04   Cancelled Treatment:    Reason Eval/Treat Not Completed: PT screened, no needs identified, will sign off. The patient reports that he has mobilized today , sitting up. Does not have his prosthesis here but feels he will be able to ambulate once he gets home and gets  Prosthesis. Declines PT at this time.    [name], [name] 07/28/2017, 10:07 AM [name] PT [phone]

## 2017-07-28 NOTE — Care Management Important Message (Signed)
Important Message  Patient Details  Name: [name] MRN: [id number] Date of Birth: [birth year]   Medicare Important Message Given:  Yes    Caren MacadamFuller, Krystianna Soth 07/28/2017, 10:36 AMImportant Message  Patient Details  Name: [name] MRN: [id number] Date of Birth: [birth year]   Medicare Important Message Given:  Yes    Caren MacadamFuller, Kier Smead 07/28/2017, 10:35 AM

## 2017-07-28 NOTE — Progress Notes (Signed)
Discharge instructions including medications discussed with and copy provided to patient/cargiver. Patient has no question. Family member takes patient home.

## 2017-07-28 NOTE — Discharge Summary (Signed)
[facility]  Physician Discharge Summary   Patient ID: [name] DikeClarence [name] MRN: [id number] DOB/AGE: May 24, 1951 66 y.o.  Admit date: 07/24/2017 Discharge date: 07/29/2017  PCP: Patient, No Pcp Per  DISCHARGE DIAGNOSES:  Principal Problem:   Hematemesis Active Problems:   Diabetes mellitus without complication (HCC)   Peripheral vascular disease (HCC)   S/P BKA (below knee amputation) unilateral (HCC)   Hypertensive urgency   Acute GI bleeding   RECOMMENDATIONS FOR OUTPATIENT FOLLOW UP: 1. Patient instructed to [facility] at the [facility]  DISCHARGE CONDITION: fair  Diet recommendation: Modified carbohydrate  Filed Weights   07/24/17 0800  Weight: 73.1 kg (161 lb 2.5 oz)    INITIAL HISTORY: 66 y.o. male past medical history negative for pneumonia, diabetes mellitus2, peripheral vascular disease status post renal and pancreatic transplants was presents to [facility] ED for nausea vomiting hematemesis, according to patient's wife she came home and found him vomiting, when she returned home later that day she relates she saw bright red blood on the floor and seems like some coffee-ground hematemesis, he denies any NSAIDs.  Consultations:  Gastroenterology   HOSPITAL COURSE:   Hematemesis He did not have any further episodes.  GI was consulted who recommended conservative management.  This episode was likely due to Mallory-Weiss tear.  Significant nausea vomiting thought to be secondary to hypertensive urgency.  He denies any NSAIDs hold aspirin.  PPI once daily.  Hemoglobin remains stable   Hypertensive urgency. Patient was started on Lasix ACE inhibitor and beta-blocker.  Blood pressure has significantly improved.  He will be discharged on the same.    Diabetes mellitus without complication A1c was 10.4 which points towards noncompliance.  Patient admitted that he was not taking his insulin as has been recommended by his primary care provider.  He has been  asked to take 15 units of Levemir twice a day.  CBGs are reasonably well controlled with this regimen.  Peripheral vascular disease Stable  S/P BKA (below knee amputation) unilateral  Stump is clean.  History of renal and pancreatic transplant Continue current medication.   Overall improved.  Okay for discharge home today.    PERTINENT LABS:  The results of significant diagnostics from this hospitalization (including imaging, microbiology, ancillary and laboratory) are listed below for reference.    Microbiology: Recent Results (from the past 240 hour(s))  MRSA PCR Screening     Status: None   Collection Time: 07/24/17 10:00 AM  Result Value Ref Range Status   MRSA by PCR NEGATIVE NEGATIVE Final    Comment:        The GeneXpert MRSA Assay (FDA approved for NASAL specimens only), is one component of a comprehensive MRSA colonization surveillance program. It is not intended to diagnose MRSA infection nor to guide or monitor treatment for MRSA infections.      Labs: Basic Metabolic Panel:  Recent Labs Lab 07/24/17 0849 07/25/17 0359 07/26/17 0851 07/27/17 0312 07/28/17 0608 07/28/17 0954  NA 143 140 132* 135 131*  --   K 4.0 3.4* 3.8 4.2 5.3* 4.8  CL 104 103 101 100* 101  --   CO2 21* 23 19* 20* 21*  --   GLUCOSE 315* 197* 202* 193* 153*  --   BUN 14 15 19  27* 28*  --   CREATININE 0.83 0.81 0.82 0.90 0.92  --   CALCIUM 9.9 9.4 9.0 9.2 9.2  --    Liver Function Tests:  Recent Labs Lab 07/24/17 0054  AST  22  ALT 18  ALKPHOS 130*  BILITOT 0.9  PROT 9.6*  ALBUMIN 5.7*   CBC:  Recent Labs Lab 07/24/17 1310 07/24/17 1616 07/25/17 0812 07/27/17 0312 07/28/17 0608  WBC 15.1* 15.3* 12.8* 12.1* 8.0  NEUTROABS  --   --  10.7* 9.7* 5.5  HGB 16.0 15.9 14.4 15.1 14.6  HCT 46.8 45.1 42.9 44.7 42.4  MCV 79.5 78.3 80.0 78.8 78.8  PLT 171 153 142* 143* 110*   Cardiac Enzymes:  Recent Labs Lab 07/24/17 1908  TROPONINI 0.05*    CBG:  Recent  Labs Lab 07/27/17 1200 07/27/17 1654 07/27/17 2233 07/28/17 0719 07/28/17 1118  GLUCAP 215* 105* 101* 151* 165*     IMAGING STUDIES Dg Abd 1 View  Result Date: 07/24/2017 CLINICAL DATA:  Nausea and vomiting. Status post renal and pancreatic transplant with persistent nausea, vomiting and hematemesis. EXAM: ABDOMEN - 1 VIEW COMPARISON:  None. FINDINGS: Paucity of bowel gas within the abdomen and pelvis. Postsurgical sutures within the right abdomen. No evidence of free intraperitoneal air seen. No acute or suspicious osseous finding. IMPRESSION: No acute findings. Paucity of bowel gas limiting characterization of the bowel gas pattern. No dilated bowel loops appreciated. Electronically Signed   By: Bary Richard M.D.   On: 07/24/2017 09:33   Dg Chest Port 1 View  Result Date: 07/24/2017 CLINICAL DATA:  Acute onset of hematemesis.  Initial encounter. EXAM: PORTABLE CHEST 1 VIEW COMPARISON:  Chest radiograph performed 10/17/2016 FINDINGS: The lungs are well-aerated and clear. There is no evidence of focal opacification, pleural effusion or pneumothorax. The cardiomediastinal silhouette is within normal limits. No acute osseous abnormalities are seen. IMPRESSION: No acute cardiopulmonary process seen. Electronically Signed   By: Roanna Raider M.D.   On: 07/24/2017 06:47    DISCHARGE EXAMINATION: Vitals:   07/27/17 1635 07/27/17 2120 07/28/17 0500 07/28/17 1021  BP: 95/62 132/76 128/74 (!) 145/69  Pulse: 84 88 80 87  Resp: 16 16 16    Temp: (!) 97.5 F (36.4 C) 98.5 F (36.9 C) 98.1 F (36.7 C)   TempSrc: Oral Oral Oral   SpO2: 100% 100% 100%   Weight:      Height:       General appearance: alert, cooperative, appears stated age and no distress Resp: clear to auscultation bilaterally Cardio: regular rate and rhythm, S1, S2 normal, no murmur, click, rub or gallop GI: soft, non-tender; bowel sounds normal; no masses,  no organomegaly   DISPOSITION: Home  Discharge Instructions     Call MD for:  extreme fatigue    Complete by:  As directed    Call MD for:  persistant dizziness or light-headedness    Complete by:  As directed    Call MD for:  persistant nausea and vomiting    Complete by:  As directed    Call MD for:  severe uncontrolled pain    Complete by:  As directed    Call MD for:  temperature >100.4    Complete by:  As directed    Diet Carb Modified    Complete by:  As directed    Discharge instructions    Complete by:  As directed    Please follow-up with your primary care physician within 1-2 weeks.  Monitor your blood glucose levels before each meal and maintain a record for your primary care physician.  Take your insulin as prescribed.  If you encounter blood glucose levels less than 100 please contact your primary care immediately.  You were cared for by a hospitalist during your hospital stay. If you have any questions about your discharge medications or the care you received while you were in the hospital after you are discharged, you can call the unit and asked to speak with the hospitalist on call if the hospitalist that took care of you is not available. Once you are discharged, your primary care physician will handle any further medical issues. Please note that NO REFILLS for any discharge medications will be authorized once you are discharged, as it is imperative that you return to your primary care physician (or establish a relationship with a primary care physician if you do not have one) for your aftercare needs so that they can reassess your need for medications and monitor your lab values. If you do not have a primary care physician, you can call [phone] for a physician referral.   Increase activity slowly    Complete by:  As directed       ALLERGIES: No Known Allergies   Discharge Medication List as of 07/28/2017 12:58 PM    START taking these medications   Details  furosemide (LASIX) 20 MG tablet Take 1 tablet (20 mg total) by mouth  daily., Starting Wed 07/28/2017, Print    labetalol (NORMODYNE) 100 MG tablet Take 2 tablets (200 mg total) by mouth 2 (two) times daily., Starting Wed 07/28/2017, Print    lisinopril (PRINIVIL,ZESTRIL) 5 MG tablet Take 1 tablet (5 mg total) by mouth daily., Starting Thu 07/29/2017, Print    pantoprazole (PROTONIX) 40 MG tablet Take 1 tablet (40 mg total) by mouth daily., Starting Wed 07/28/2017, Print      CONTINUE these medications which have CHANGED   Details  insulin detemir (LEVEMIR) 100 UNIT/ML injection Inject 0.15 mLs (15 Units total) into the skin 2 (two) times daily., Starting Wed 07/28/2017, No Print      CONTINUE these medications which have NOT CHANGED   Details  amLODipine (NORVASC) 10 MG tablet Take 10 mg by mouth daily., Historical Med    aspirin EC 81 MG tablet Take 81 mg by mouth daily., Historical Med    atorvastatin (LIPITOR) 80 MG tablet Take 80 mg by mouth at bedtime., Historical Med    mycophenolate (CELLCEPT) 250 MG capsule Take 1,000 mg by mouth 2 (two) times daily., Until Discontinued, Historical Med    tacrolimus (PROGRAF) 5 MG capsule Take 5 mg by mouth 2 (two) times daily., Historical Med           TOTAL DISCHARGE TIME: 35 mins  [facility]  [facility] Pager [phone]  07/29/2017, 12:48 PM

## 2017-07-28 NOTE — Progress Notes (Signed)
Inpatient Diabetes Program Recommendations  AACE/ADA: New Consensus Statement on Inpatient Glycemic Control (2015)  Target Ranges:  Prepandial:   less than 140 mg/dL      Peak postprandial:   less than 180 mg/dL (1-2 hours)      Critically ill patients:  140 - 180 mg/dL   Lab Results  Component Value Date   GLUCAP 165 (H) 07/28/2017   HGBA1C 10.4 (H) 07/24/2017    Review of Glycemic Control  Spoke with pt regarding his HgbA1C. Pt states he checks his blood sugars and knew it must be elevated, but has not followed up with PCP. Encouraged pt to make MD appt and take blood sugar log. Tried to talk with him about diet, exercise and stress management. Pt was not interested in discussing his diabetes at this time. Encouraged him to make MD appt for management of diabetes.  Discharge Orders:  Levemir 15 units bid  Thank you. Ailene Ardshonda Kadien Lineman, RD, LDN, CDE Inpatient Diabetes Coordinator [phone]

## 2017-07-29 LAB — MYCOPHENOLIC ACID (CELLCEPT)
MPA Glucuronide: 63 ug/mL (ref 15–125)
MPA: 4.8 ug/mL — ABNORMAL HIGH (ref 1.0–3.5)

## 2017-09-16 ENCOUNTER — Ambulatory Visit: Payer: Medicare Other | Admitting: Adult Health

## 2017-10-18 ENCOUNTER — Emergency Department (HOSPITAL_COMMUNITY): Payer: Medicare Other

## 2017-10-18 ENCOUNTER — Inpatient Hospital Stay (HOSPITAL_COMMUNITY)
Admission: EM | Admit: 2017-10-18 | Discharge: 2017-10-21 | DRG: 392 | Disposition: A | Discharge status: Home / Self Care | Payer: Medicare Other | Attending: Internal Medicine | Admitting: Internal Medicine

## 2017-10-18 ENCOUNTER — Encounter (HOSPITAL_COMMUNITY): Payer: Self-pay | Admitting: Internal Medicine

## 2017-10-18 ENCOUNTER — Other Ambulatory Visit: Payer: Self-pay

## 2017-10-18 DIAGNOSIS — R1013 Epigastric pain: Principal | ICD-10-CM | POA: Diagnosis present

## 2017-10-18 DIAGNOSIS — Z940 Kidney transplant status: Secondary | ICD-10-CM

## 2017-10-18 DIAGNOSIS — E1051 Type 1 diabetes mellitus with diabetic peripheral angiopathy without gangrene: Secondary | ICD-10-CM | POA: Diagnosis present

## 2017-10-18 DIAGNOSIS — Z89512 Acquired absence of left leg below knee: Secondary | ICD-10-CM

## 2017-10-18 DIAGNOSIS — T407X1A Poisoning by cannabis (derivatives), accidental (unintentional), initial encounter: Secondary | ICD-10-CM | POA: Diagnosis present

## 2017-10-18 DIAGNOSIS — K3184 Gastroparesis: Secondary | ICD-10-CM | POA: Diagnosis present

## 2017-10-18 DIAGNOSIS — Z9483 Pancreas transplant status: Secondary | ICD-10-CM

## 2017-10-18 DIAGNOSIS — R1011 Right upper quadrant pain: Secondary | ICD-10-CM | POA: Diagnosis not present

## 2017-10-18 DIAGNOSIS — R112 Nausea with vomiting, unspecified: Secondary | ICD-10-CM | POA: Diagnosis present

## 2017-10-18 DIAGNOSIS — I160 Hypertensive urgency: Secondary | ICD-10-CM | POA: Diagnosis present

## 2017-10-18 DIAGNOSIS — E871 Hypo-osmolality and hyponatremia: Secondary | ICD-10-CM | POA: Diagnosis not present

## 2017-10-18 DIAGNOSIS — K920 Hematemesis: Secondary | ICD-10-CM | POA: Diagnosis not present

## 2017-10-18 DIAGNOSIS — R030 Elevated blood-pressure reading, without diagnosis of hypertension: Secondary | ICD-10-CM | POA: Diagnosis not present

## 2017-10-18 DIAGNOSIS — E1065 Type 1 diabetes mellitus with hyperglycemia: Secondary | ICD-10-CM | POA: Diagnosis present

## 2017-10-18 DIAGNOSIS — Z7982 Long term (current) use of aspirin: Secondary | ICD-10-CM

## 2017-10-18 DIAGNOSIS — I739 Peripheral vascular disease, unspecified: Secondary | ICD-10-CM | POA: Diagnosis present

## 2017-10-18 DIAGNOSIS — E1043 Type 1 diabetes mellitus with diabetic autonomic (poly)neuropathy: Secondary | ICD-10-CM | POA: Diagnosis present

## 2017-10-18 DIAGNOSIS — R739 Hyperglycemia, unspecified: Secondary | ICD-10-CM

## 2017-10-18 DIAGNOSIS — E785 Hyperlipidemia, unspecified: Secondary | ICD-10-CM | POA: Diagnosis present

## 2017-10-18 DIAGNOSIS — E860 Dehydration: Secondary | ICD-10-CM | POA: Diagnosis not present

## 2017-10-18 DIAGNOSIS — E1165 Type 2 diabetes mellitus with hyperglycemia: Secondary | ICD-10-CM | POA: Diagnosis not present

## 2017-10-18 DIAGNOSIS — Z794 Long term (current) use of insulin: Secondary | ICD-10-CM

## 2017-10-18 DIAGNOSIS — E1151 Type 2 diabetes mellitus with diabetic peripheral angiopathy without gangrene: Secondary | ICD-10-CM

## 2017-10-18 DIAGNOSIS — A084 Viral intestinal infection, unspecified: Secondary | ICD-10-CM | POA: Diagnosis present

## 2017-10-18 DIAGNOSIS — K219 Gastro-esophageal reflux disease without esophagitis: Secondary | ICD-10-CM | POA: Diagnosis present

## 2017-10-18 DIAGNOSIS — H5462 Unqualified visual loss, left eye, normal vision right eye: Secondary | ICD-10-CM | POA: Diagnosis present

## 2017-10-18 DIAGNOSIS — I10 Essential (primary) hypertension: Secondary | ICD-10-CM | POA: Diagnosis present

## 2017-10-18 LAB — CBC WITH DIFFERENTIAL/PLATELET
Basophils Absolute: 0 10*3/uL (ref 0.0–0.1)
Basophils Relative: 0 %
Eosinophils Absolute: 0 10*3/uL (ref 0.0–0.7)
Eosinophils Relative: 0 %
HCT: 48.6 % (ref 39.0–52.0)
Hemoglobin: 16.7 g/dL (ref 13.0–17.0)
Lymphocytes Relative: 8 %
Lymphs Abs: 1.4 10*3/uL (ref 0.7–4.0)
MCH: 27.6 pg (ref 26.0–34.0)
MCHC: 34.4 g/dL (ref 30.0–36.0)
MCV: 80.2 fL (ref 78.0–100.0)
Monocytes Absolute: 1.2 10*3/uL — ABNORMAL HIGH (ref 0.1–1.0)
Monocytes Relative: 7 %
Neutro Abs: 13.9 10*3/uL — ABNORMAL HIGH (ref 1.7–7.7)
Neutrophils Relative %: 85 %
Platelets: 181 10*3/uL (ref 150–400)
RBC: 6.06 MIL/uL — ABNORMAL HIGH (ref 4.22–5.81)
RDW: 14.1 % (ref 11.5–15.5)
WBC: 16.6 10*3/uL — ABNORMAL HIGH (ref 4.0–10.5)

## 2017-10-18 LAB — BLOOD GAS, VENOUS
Acid-base deficit: 5.5 mmol/L — ABNORMAL HIGH (ref 0.0–2.0)
Bicarbonate: 20.2 mmol/L (ref 20.0–28.0)
Drawn by: 257881
O2 Saturation: 57.4 %
Patient temperature: 98.6
pCO2, Ven: 41.6 mmHg — ABNORMAL LOW (ref 44.0–60.0)
pH, Ven: 7.308 (ref 7.250–7.430)
pO2, Ven: 33.5 mmHg (ref 32.0–45.0)

## 2017-10-18 LAB — CBG MONITORING, ED
Glucose-Capillary: 316 mg/dL — ABNORMAL HIGH (ref 65–99)
Glucose-Capillary: 292 mg/dL — ABNORMAL HIGH (ref 65–99)
Glucose-Capillary: 244 mg/dL — ABNORMAL HIGH (ref 65–99)
Glucose-Capillary: 184 mg/dL — ABNORMAL HIGH (ref 65–99)

## 2017-10-18 LAB — COMPREHENSIVE METABOLIC PANEL
ALT: 16 U/L — ABNORMAL LOW (ref 17–63)
AST: 29 U/L (ref 15–41)
Albumin: 4.6 g/dL (ref 3.5–5.0)
Alkaline Phosphatase: 111 U/L (ref 38–126)
Anion gap: 18 — ABNORMAL HIGH (ref 5–15)
BUN: 21 mg/dL — ABNORMAL HIGH (ref 6–20)
CO2: 18 mmol/L — ABNORMAL LOW (ref 22–32)
Calcium: 9.6 mg/dL (ref 8.9–10.3)
Chloride: 92 mmol/L — ABNORMAL LOW (ref 101–111)
Creatinine, Ser: 1.07 mg/dL (ref 0.61–1.24)
GFR calc Af Amer: >60 mL/min (ref >60)
GFR calc non Af Amer: >60 mL/min (ref >60)
Glucose, Bld: 300 mg/dL — ABNORMAL HIGH (ref 65–99)
Potassium: 5.7 mmol/L — ABNORMAL HIGH (ref 3.5–5.1)
Sodium: 128 mmol/L — ABNORMAL LOW (ref 135–145)
Total Bilirubin: 2.5 mg/dL — ABNORMAL HIGH (ref 0.3–1.2)
Total Protein: 7.8 g/dL (ref 6.5–8.1)

## 2017-10-18 LAB — URINALYSIS, ROUTINE W REFLEX MICROSCOPIC
Bilirubin Urine: NEGATIVE
Glucose, UA: >=500 mg/dL — AB
Ketones, ur: 80 mg/dL — AB
Leukocytes, UA: NEGATIVE
Nitrite: NEGATIVE
Protein, ur: 100 mg/dL — AB
RBC / HPF: NONE SEEN RBC/hpf (ref 0–5)
Specific Gravity, Urine: 1.023 (ref 1.005–1.030)
Squamous Epithelial / LPF: NONE SEEN
pH: 5 (ref 5.0–8.0)

## 2017-10-18 LAB — LIPASE, BLOOD: Lipase: 24 U/L (ref 11–51)

## 2017-10-18 LAB — BASIC METABOLIC PANEL
Anion gap: 11 (ref 5–15)
BUN: 20 mg/dL (ref 6–20)
CO2: 20 mmol/L — ABNORMAL LOW (ref 22–32)
Calcium: 9 mg/dL (ref 8.9–10.3)
Chloride: 101 mmol/L (ref 101–111)
Creatinine, Ser: 0.78 mg/dL (ref 0.61–1.24)
GFR calc Af Amer: >60 mL/min (ref >60)
GFR calc non Af Amer: >60 mL/min (ref >60)
Glucose, Bld: 183 mg/dL — ABNORMAL HIGH (ref 65–99)
Potassium: 3.7 mmol/L (ref 3.5–5.1)
Sodium: 132 mmol/L — ABNORMAL LOW (ref 135–145)

## 2017-10-18 MED ORDER — ACETAMINOPHEN 650 MG RE SUPP: 650.0000 mg | Freq: Four times a day (QID) | RECTAL | Status: DC | PRN

## 2017-10-18 MED ORDER — ONDANSETRON HCL 4 MG/2ML IJ SOLN
4.0000 mg | Freq: Four times a day (QID) | INTRAMUSCULAR | Status: DC | PRN
  Administered 2017-10-19 (×2): 4 mg via INTRAVENOUS
  Filled 2017-10-18 (×2): qty 2

## 2017-10-18 MED ORDER — SODIUM CHLORIDE 0.9 % IV BOLUS (SEPSIS)
1000.0000 mL | Freq: Once | INTRAVENOUS | Status: AC
  Administered 2017-10-18: 1000 mL via INTRAVENOUS

## 2017-10-18 MED ORDER — MYCOPHENOLATE MOFETIL 250 MG PO CAPS
1000.0000 mg | ORAL_CAPSULE | Freq: Two times a day (BID) | ORAL | Status: DC
  Administered 2017-10-19 – 2017-10-21 (×5): 1000 mg via ORAL
  Filled 2017-10-18 (×6): qty 4

## 2017-10-18 MED ORDER — INSULIN ASPART 100 UNIT/ML ~~LOC~~ SOLN
0.0000 [IU] | Freq: Three times a day (TID) | SUBCUTANEOUS | Status: DC
  Administered 2017-10-19 (×2): 2 [IU] via SUBCUTANEOUS
  Administered 2017-10-19: 3 [IU] via SUBCUTANEOUS
  Administered 2017-10-20 (×2): 1 [IU] via SUBCUTANEOUS
  Administered 2017-10-20 – 2017-10-21 (×2): 2 [IU] via SUBCUTANEOUS
  Filled 2017-10-18 (×2): qty 1

## 2017-10-18 MED ORDER — TACROLIMUS 1 MG PO CAPS
5.0000 mg | ORAL_CAPSULE | Freq: Two times a day (BID) | ORAL | Status: DC
  Administered 2017-10-19 – 2017-10-21 (×5): 5 mg via ORAL
  Filled 2017-10-18 (×6): qty 5

## 2017-10-18 MED ORDER — AMLODIPINE BESYLATE 5 MG PO TABS
5.0000 mg | ORAL_TABLET | Freq: Once | ORAL | Status: AC
  Administered 2017-10-18: 5 mg via ORAL
  Filled 2017-10-18: qty 1

## 2017-10-18 MED ORDER — AMLODIPINE BESYLATE 10 MG PO TABS
10.0000 mg | ORAL_TABLET | Freq: Every day | ORAL | Status: DC
  Administered 2017-10-19 – 2017-10-21 (×3): 10 mg via ORAL
  Filled 2017-10-18 (×3): qty 1

## 2017-10-18 MED ORDER — LISINOPRIL 5 MG PO TABS
5.0000 mg | ORAL_TABLET | Freq: Every day | ORAL | Status: DC
  Administered 2017-10-19 – 2017-10-21 (×3): 5 mg via ORAL
  Filled 2017-10-18 (×4): qty 1

## 2017-10-18 MED ORDER — LABETALOL HCL 200 MG PO TABS
200.0000 mg | ORAL_TABLET | Freq: Once | ORAL | Status: AC
  Administered 2017-10-18: 200 mg via ORAL
  Filled 2017-10-18: qty 1

## 2017-10-18 MED ORDER — SODIUM CHLORIDE 0.9 % IV SOLN
Freq: Once | INTRAVENOUS | Status: AC
Start: 2017-10-18 — End: 2017-10-18
  Administered 2017-10-18: 100 mL/h via INTRAVENOUS

## 2017-10-18 MED ORDER — INSULIN ASPART 100 UNIT/ML ~~LOC~~ SOLN
5.0000 [IU] | Freq: Once | SUBCUTANEOUS | Status: AC
  Administered 2017-10-18: 5 [IU] via SUBCUTANEOUS
  Filled 2017-10-18: qty 1

## 2017-10-18 MED ORDER — ONDANSETRON HCL 4 MG PO TABS: 4.0000 mg | ORAL_TABLET | Freq: Four times a day (QID) | ORAL | Status: DC | PRN

## 2017-10-18 MED ORDER — LABETALOL HCL 200 MG PO TABS
200.0000 mg | ORAL_TABLET | Freq: Two times a day (BID) | ORAL | Status: DC
  Administered 2017-10-19 – 2017-10-21 (×6): 200 mg via ORAL
  Filled 2017-10-18 (×7): qty 1

## 2017-10-18 MED ORDER — ONDANSETRON HCL 4 MG/2ML IJ SOLN
4.0000 mg | Freq: Once | INTRAMUSCULAR | Status: AC
  Administered 2017-10-18: 4 mg via INTRAVENOUS
  Filled 2017-10-18: qty 2

## 2017-10-18 MED ORDER — ASPIRIN EC 81 MG PO TBEC
81.0000 mg | DELAYED_RELEASE_TABLET | Freq: Every day | ORAL | Status: DC
  Administered 2017-10-19 – 2017-10-21 (×3): 81 mg via ORAL
  Filled 2017-10-18 (×4): qty 1

## 2017-10-18 MED ORDER — LACTATED RINGERS IV SOLN
INTRAVENOUS | Status: DC
  Administered 2017-10-19 (×2): via INTRAVENOUS

## 2017-10-18 MED ORDER — LABETALOL HCL 5 MG/ML IV SOLN: 10.0000 mg | Freq: Once | INTRAVENOUS | Status: DC

## 2017-10-18 MED ORDER — LABETALOL HCL 5 MG/ML IV SOLN
10.0000 mg | Freq: Once | INTRAVENOUS | Status: AC
  Administered 2017-10-18: 10 mg via INTRAVENOUS
  Filled 2017-10-18: qty 4

## 2017-10-18 MED ORDER — PANTOPRAZOLE SODIUM 40 MG PO TBEC
40.0000 mg | DELAYED_RELEASE_TABLET | Freq: Every day | ORAL | Status: DC
  Administered 2017-10-19 – 2017-10-21 (×3): 40 mg via ORAL
  Filled 2017-10-18 (×3): qty 1

## 2017-10-18 MED ORDER — ACETAMINOPHEN 325 MG PO TABS: 650.0000 mg | ORAL_TABLET | Freq: Four times a day (QID) | ORAL | Status: DC | PRN

## 2017-10-18 MED ORDER — INSULIN DETEMIR 100 UNIT/ML ~~LOC~~ SOLN
10.0000 [IU] | Freq: Two times a day (BID) | SUBCUTANEOUS | Status: DC
  Filled 2017-10-18 (×2): qty 0.1

## 2017-10-18 MED ORDER — ATORVASTATIN CALCIUM 40 MG PO TABS
80.0000 mg | ORAL_TABLET | Freq: Every day | ORAL | Status: DC
  Administered 2017-10-19 – 2017-10-20 (×3): 80 mg via ORAL
  Filled 2017-10-18: qty 1
  Filled 2017-10-18 (×2): qty 2

## 2017-10-18 MED ORDER — ENOXAPARIN SODIUM 40 MG/0.4ML ~~LOC~~ SOLN
40.0000 mg | Freq: Every day | SUBCUTANEOUS | Status: DC
  Administered 2017-10-19 – 2017-10-21 (×2): 40 mg via SUBCUTANEOUS
  Filled 2017-10-18 (×4): qty 0.4

## 2017-10-18 NOTE — ED Provider Notes (Signed)
[facility]-EMERGENCY DEPT Provider Note   CSN: [id number] Arrival date & time: 10/18/17  1459     History   Chief Complaint Chief Complaint  Patient presents with  . Hyperglycemia  . Emesis    HPI [name] is a 67 y.o. male.  HPI   67 year old male with past medical history as below including renal transplant here with nausea and vomiting.  The patient states his symptoms started approximately 3 days ago.  He noticed nausea followed by multiple episodes of emesis.  He has been vomiting everything he tried to eat or drink.  He subsequently has not been able to take any of his medications, including his medications for transplant.  He has had associated increase in his blood sugars despite not eating and drinking.  He denies any associated abdominal pain.  He has a mild sore throat after vomiting, but this does not bother him when he is not vomiting.  Denies any shortness of breath.  No cough.  No fevers.  Denies any abdominal pain.  No dysuria or frequency he says he is continue to make urine without difficulty.  No rash.  No other medication changes.  Past Medical History:  Diagnosis Date  . Anxiety   . CPAP (continuous positive airway pressure) dependence   . Diabetes mellitus without complication (HCC)   . GERD (gastroesophageal reflux disease)   . Hypertension   . Legally blind in left eye, as defined in [place]   . PTSD (post-traumatic stress disorder)   . PVD (peripheral vascular disease) (HCC) 01/17/2015  . Renal disorder   . Sleep apnea   . Thyroid disease     Patient Active Problem List   Diagnosis Date Noted  . Nausea & vomiting 10/18/2017  . Hematemesis 07/24/2017  . Hypertensive urgency 07/24/2017  . Acute GI bleeding 07/24/2017  . 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 S/P BKA (below knee amputation) unilateral (HCC) 02/20/2015  . Ischemic ulcer of toe (HCC)   . Peripheral vascular disease (HCC)     . PVD (peripheral vascular disease) (HCC) 01/17/2015  . Cellulitis and abscess of foot 01/13/2015  . Cellulitis and abscess of foot excluding toe 01/13/2015  . Diabetes mellitus without complication (HCC) 01/13/2015  . Leukocytosis 01/13/2015  . Diabetes mellitus with peripheral vascular disease (HCC) 12/19/2007  . DYSLIPIDEMIA 12/19/2007  . ANEMIA 12/19/2007  . ANXIETY 12/19/2007  . Essential hypertension 12/19/2007  . TRANSIENT ISCHEMIC ATTACK 12/19/2007  . PERIPHERAL VASCULAR DISEASE 12/19/2007  . RENAL FAILURE 12/19/2007    Past Surgical History:  Procedure Laterality Date  . ABDOMINAL AORTAGRAM N/A 01/21/2015   Procedure: ABDOMINAL Ronny Flurry;  Surgeon: Chuck Hint, MD;  Location: [facility] CATH LAB;  Service: Cardiovascular;  Laterality: N/A;  . AMPUTATION Left 01/23/2015   Procedure: FIRST RAY AMPUTATION/LEFT FOOT;  Surgeon: Nadara Mustard, MD;  Location: [facility] OR;  Service: Orthopedics;  Laterality: Left;  . AMPUTATION Left 02/20/2015   Procedure: AMPUTATION BELOW KNEE;  Surgeon: Nadara Mustard, MD;  Location: [facility] OR;  Service: Orthopedics;  Laterality: Left;  . AV FISTULA PLACEMENT    . COLONOSCOPY    . COMBINED KIDNEY-PANCREAS TRANSPLANT Left 2009  . INCISION AND DRAINAGE ABSCESS Left 01/13/2015   Procedure: INCISION AND DRAINAGE OF LEFT FOOT FIRST RAY ABCESS;  Surgeon: Kathryne Hitch, MD;  Location: [facility] OR;  Service: Orthopedics;  Laterality: Left;  . LOWER EXTREMITY ANGIOGRAM Left 01/21/2015   Procedure: LOWER EXTREMITY ANGIOGRAM;  Surgeon: Chuck Hint, MD;  Location: [facility] CATH LAB;  Service: Cardiovascular;  Laterality: Left;       Home Medications    Prior to Admission medications   Medication Sig Start Date End Date Taking? Authorizing Provider  atorvastatin (LIPITOR) 80 MG tablet Take 80 mg by mouth at bedtime.   Yes [provider]  amLODipine (NORVASC) 10 MG tablet Take 10 mg by mouth daily.    [provider]  aspirin EC 81 MG  tablet Take 81 mg by mouth daily.    [provider]  furosemide (LASIX) 20 MG tablet Take 1 tablet (20 mg total) by mouth daily. Patient not taking: Reported on 10/18/2017 07/28/17   Osvaldo Shipper, MD  insulin detemir (LEVEMIR) 100 UNIT/ML injection Inject 0.15 mLs (15 Units total) into the skin 2 (two) times daily. 07/28/17   Osvaldo Shipper, MD  labetalol (NORMODYNE) 100 MG tablet Take 2 tablets (200 mg total) by mouth 2 (two) times daily. 07/28/17   Osvaldo Shipper, MD  lisinopril (PRINIVIL,ZESTRIL) 5 MG tablet Take 1 tablet (5 mg total) by mouth daily. 07/29/17   Osvaldo Shipper, MD  mycophenolate (CELLCEPT) 250 MG capsule Take 1,000 mg by mouth 2 (two) times daily.    [provider]  pantoprazole (PROTONIX) 40 MG tablet Take 1 tablet (40 mg total) by mouth daily. 07/28/17   Osvaldo Shipper, MD  tacrolimus (PROGRAF) 5 MG capsule Take 5 mg by mouth 2 (two) times daily.    [provider]    Family History Family History  Problem Relation Age of Onset  . Hypertension Other     Social History Social History   Tobacco Use  . Smoking status: Former Games developer  . Smokeless tobacco: Never Used  . Tobacco comment: quit i [id number]  Substance Use Topics  . Alcohol use: Yes    Comment: 1 beer rarely   . Drug use: No     Allergies   Patient has no known allergies.   Review of Systems Review of Systems  Constitutional: Positive for fatigue.  Gastrointestinal: Positive for nausea and vomiting.  All other systems reviewed and are negative.    Physical Exam Updated Vital Signs BP (!) 187/78 (BP Location: Right Arm)   Pulse 97   Temp 97.7 F (36.5 C) (Oral)   Resp (!) 21   Wt 72.6 kg (160 lb)   SpO2 100%   BMI 22.96 kg/m   Physical Exam  Constitutional: He is oriented to person, place, and time. He appears well-developed and well-nourished. No distress.  HENT:  Head: Normocephalic and atraumatic.  Dry mixed membranes  Eyes: Conjunctivae are  normal.  Neck: Neck supple.  Cardiovascular: Regular rhythm and normal heart sounds. Exam reveals no friction rub.  No murmur heard. Tachycardia  Pulmonary/Chest: Effort normal and breath sounds normal. No respiratory distress. He has no wheezes. He has no rales.  Abdominal: He exhibits no distension.  Musculoskeletal: He exhibits no edema.  Neurological: He is alert and oriented to person, place, and time. He exhibits normal muscle tone.  Skin: Skin is warm. Capillary refill takes less than 2 seconds.  Psychiatric: He has a normal mood and affect.  Nursing note and vitals reviewed.    ED Treatments / Results  Labs (all labs ordered are listed, but only abnormal results are displayed) Labs Reviewed  CBC WITH DIFFERENTIAL/PLATELET - Abnormal; Notable for the following components:      Result Value   WBC 16.6 (*)  RBC 6.06 (*)    Neutro Abs 13.9 (*)    Monocytes Absolute 1.2 (*)    All other components within normal limits  COMPREHENSIVE METABOLIC PANEL - Abnormal; Notable for the following components:   Sodium 128 (*)    Potassium 5.7 (*)    Chloride 92 (*)    CO2 18 (*)    Glucose, Bld 300 (*)    BUN 21 (*)    ALT 16 (*)    Total Bilirubin 2.5 (*)    Anion gap 18 (*)    All other components within normal limits  BLOOD GAS, VENOUS - Abnormal; Notable for the following components:   pCO2, Ven 41.6 (*)    Acid-base deficit 5.5 (*)    All other components within normal limits  URINALYSIS, ROUTINE W REFLEX MICROSCOPIC - Abnormal; Notable for the following components:   Color, Urine STRAW (*)    Glucose, UA >=500 (*)    Hgb urine dipstick SMALL (*)    Ketones, ur 80 (*)    Protein, ur 100 (*)    Bacteria, UA RARE (*)    All other components within normal limits  BASIC METABOLIC PANEL - Abnormal; Notable for the following components:   Sodium 132 (*)    CO2 20 (*)    Glucose, Bld 183 (*)    All other components within normal limits  CBG MONITORING, ED - Abnormal;  Notable for the following components:   Glucose-Capillary 316 (*)    All other components within normal limits  CBG MONITORING, ED - Abnormal; Notable for the following components:   Glucose-Capillary 292 (*)    All other components within normal limits  CBG MONITORING, ED - Abnormal; Notable for the following components:   Glucose-Capillary 244 (*)    All other components within normal limits  CBG MONITORING, ED - Abnormal; Notable for the following components:   Glucose-Capillary 184 (*)    All other components within normal limits  LIPASE, BLOOD  TACROLIMUS LEVEL  BASIC METABOLIC PANEL  CBC  CBC  CREATININE, SERUM  HEPATIC FUNCTION PANEL  CBG MONITORING, ED    EKG  EKG Interpretation  Date/Time:  Monday October 18 2017 16:14:38 EST Ventricular Rate:  116 PR Interval:    QRS Duration: 89 QT Interval:  313 QTC Calculation: 435 R Axis:   109 Text Interpretation:  Sinus tachycardia Right axis deviation Probable anteroseptal infarct, old Borderline ST elevation, lateral leads Since last EKG, rate has increased Confirmed by Shaune Pollack [phone]) on 10/18/2017 5:31:29 PM       Radiology US Abdomen Limited Ruq  Result Date: 10/18/2017 CLINICAL DATA:  Right upper quadrant abdominal pain and nausea. EXAM: ULTRASOUND ABDOMEN LIMITED RIGHT UPPER QUADRANT COMPARISON:  None. FINDINGS: Gallbladder: No gallstones or wall thickening visualized. No sonographic Murphy sign noted by sonographer. Common bile duct: Diameter: Normal caliber of 3 mm. Liver: No focal lesion identified. Within normal limits in parenchymal echogenicity. Portal vein is patent on color Doppler imaging with normal direction of blood flow towards the liver. IMPRESSION: Normal right upper quadrant abdominal ultrasound. Electronically Signed   By: Irish Lack M.D.   On: 10/18/2017 18:28    Procedures Procedures (including critical care time)  Medications Ordered in ED Medications  amLODipine (NORVASC) tablet  10 mg (not administered)  aspirin EC tablet 81 mg (not administered)  atorvastatin (LIPITOR) tablet 80 mg (80 mg Oral Given 10/19/17 0116)  insulin detemir (LEVEMIR) injection 10 Units (0 Units Subcutaneous Hold 10/19/17 0119)  labetalol (NORMODYNE) tablet 200 mg (200 mg Oral Given 10/19/17 0117)  lisinopril (PRINIVIL,ZESTRIL) tablet 5 mg (not administered)  mycophenolate (CELLCEPT) capsule 1,000 mg (1,000 mg Oral Given 10/19/17 0118)  pantoprazole (PROTONIX) EC tablet 40 mg (not administered)  tacrolimus (PROGRAF) capsule 5 mg (5 mg Oral Given 10/19/17 0118)  acetaminophen (TYLENOL) tablet 650 mg (not administered)    Or  acetaminophen (TYLENOL) suppository 650 mg (not administered)  ondansetron (ZOFRAN) tablet 4 mg (not administered)    Or  ondansetron (ZOFRAN) injection 4 mg (not administered)  insulin aspart (novoLOG) injection 0-9 Units (not administered)  enoxaparin (LOVENOX) injection 40 mg (not administered)  lactated ringers infusion (not administered)  sodium chloride 0.9 % bolus 1,000 mL (0 mLs Intravenous Stopped 10/18/17 1702)  ondansetron (ZOFRAN) injection 4 mg (4 mg Intravenous Given 10/18/17 1619)  insulin aspart (novoLOG) injection 5 Units (5 Units Subcutaneous Given 10/18/17 1740)  sodium chloride 0.9 % bolus 1,000 mL (0 mLs Intravenous Stopped 10/18/17 1835)  labetalol (NORMODYNE,TRANDATE) injection 10 mg (10 mg Intravenous Given 10/18/17 1832)  amLODipine (NORVASC) tablet 5 mg (5 mg Oral Given 10/18/17 1941)  labetalol (NORMODYNE) tablet 200 mg (200 mg Oral Given 10/18/17 1942)  ondansetron (ZOFRAN) injection 4 mg (4 mg Intravenous Given 10/18/17 1942)  0.9 %  sodium chloride infusion ( Intravenous Stopped 10/18/17 2304)  insulin aspart (novoLOG) injection 5 Units (5 Units Subcutaneous Given 10/18/17 2213)  sodium chloride 0.9 % bolus 1,000 mL (0 mLs Intravenous Stopped 10/19/17 0034)     Initial Impression / Assessment and Plan / ED Course  I have reviewed the triage vital  signs and the nursing notes.  Pertinent labs & imaging results that were available during my care of the patient were reviewed by me and considered in my medical decision making (see chart for details).     67 year old male with history of renal transplant here with intractable nausea and vomiting.  Lab work fortunately shows that renal function is baseline, but he does appear significantly dehydrated clinically and is tachycardic, hypertensive, with significant ketonuria.  He is been vomiting his rejection meds as well as his blood pressure medicines.  He was given multiple doses of IV antihypertensive and anti-medic.  His vital signs do seem to be improving, but he has persistent nausea and vomiting and difficulty tolerating p.o. AG closed with one dose of insulin, pH >7.3 - doubt ongoing DKA.  Final Clinical Impressions(s) / ED Diagnoses   Final diagnoses:  RUQ pain  Hyperglycemia  Dehydration    ED Discharge Orders    None       Shaune PollackIsaacs, Lawsen Arnott, MD 10/19/17 [phone]

## 2017-10-18 NOTE — ED Notes (Signed)
Lav and Dark green tube pulled for Tacrolimus level. Sent to lab at this time.

## 2017-10-18 NOTE — ED Notes (Signed)
Restricted limb bracelet applied to rt arm

## 2017-10-18 NOTE — H&P (Signed)
History and Physical    [name] ZOX:[id number] DOB: [birth year] DOA: 10/18/2017  PCP: Patient, No Pcp Per  Patient coming from: Home.  Chief Complaint: Nausea vomiting.  HPI: [name] is a 67 y.o. 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  Patient has been having nausea vomiting for the last 2 days unable to keep in his medications.  Denies any diarrhea or abdominal pain.  ED Course: In the ER patient also was found to have elevated blood pressure.  Was given labetalol IV.  Initial labs showed blood sugar around 300 with anion gap of 18 and bicarb was low.  Improved with fluid bolus and anion gap is corrected.  Patient subsequently had low normal blood sugar around 96 after being given subcutaneous NovoLog 5 units.  Patient also was given antiemetics.  Patient is being admitted for intractable nausea vomiting.  Abdomen on exam appears benign.  Patient symptoms most likely from gastroparesis.  Sonogram of the right upper quadrant was unremarkable.  Review of Systems: As per HPI, rest all negative.   Past Medical History:  Diagnosis Date  . Anxiety   . CPAP (continuous positive airway pressure) dependence   . Diabetes mellitus without complication (HCC)   . GERD (gastroesophageal reflux disease)   . Hypertension   . Legally blind in left eye, as defined in [place]   . PTSD (post-traumatic stress disorder)   . PVD (peripheral vascular disease) (HCC) 01/17/2015  . Renal disorder   . Sleep apnea   . Thyroid disease     Past Surgical History:  Procedure Laterality Date  . ABDOMINAL AORTAGRAM N/A 01/21/2015   Procedure: ABDOMINAL Ronny Flurry;  Surgeon: Chuck Hint, MD;  Location: [facility] CATH LAB;  Service: Cardiovascular;  Laterality: N/A;  . AMPUTATION Left 01/23/2015   Procedure: FIRST RAY AMPUTATION/LEFT FOOT;  Surgeon: Nadara Mustard, MD;   Location: [facility] OR;  Service: Orthopedics;  Laterality: Left;  . AMPUTATION Left 02/20/2015   Procedure: AMPUTATION BELOW KNEE;  Surgeon: Nadara Mustard, MD;  Location: [facility] OR;  Service: Orthopedics;  Laterality: Left;  . AV FISTULA PLACEMENT    . COLONOSCOPY    . COMBINED KIDNEY-PANCREAS TRANSPLANT Left 2009  . INCISION AND DRAINAGE ABSCESS Left 01/13/2015   Procedure: INCISION AND DRAINAGE OF LEFT FOOT FIRST RAY ABCESS;  Surgeon: Kathryne Hitch, MD;  Location: [facility] OR;  Service: Orthopedics;  Laterality: Left;  . LOWER EXTREMITY ANGIOGRAM Left 01/21/2015   Procedure: LOWER EXTREMITY ANGIOGRAM;  Surgeon: Chuck Hint, MD;  Location[facility] CATH LAB;  Service: Cardiovascular;  Laterality: Left;     reports that he has quit smoking. he has never used smokeless tobacco. He reports that he drinks alcohol. He reports that he does not use drugs.  No Known Allergies  Family History  Problem Relation Age of Onset  . Hypertension Other     Prior to Admission medications   Medication Sig Start Date End Date Taking? Authorizing Provider  atorvastatin (LIPITOR) 80 MG tablet Take 80 mg by mouth at bedtime.   Yes [provider]  amLODipine (NORVASC) 10 MG tablet Take 10 mg by mouth daily.    [provider]  aspirin EC 81 MG tablet Take 81 mg by mouth daily.    [provider]  furosemide (LASIX) 20 MG tablet Take 1 tablet (20 mg total) by mouth daily. Patient not taking:  Reported on 10/18/2017 07/28/17   Osvaldo ShipperKrishnan, Gokul, MD  insulin detemir (LEVEMIR) 100 UNIT/ML injection Inject 0.15 mLs (15 Units total) into the skin 2 (two) times daily. 07/28/17   Osvaldo ShipperKrishnan, Gokul, MD  labetalol (NORMODYNE) 100 MG tablet Take 2 tablets (200 mg total) by mouth 2 (two) times daily. 07/28/17   Osvaldo ShipperKrishnan, Gokul, MD  lisinopril (PRINIVIL,ZESTRIL) 5 MG tablet Take 1 tablet (5 mg total) by mouth daily. 07/29/17   Osvaldo ShipperKrishnan, Gokul, MD  mycophenolate (CELLCEPT) 250 MG capsule Take 1,000 mg by  mouth 2 (two) times daily.    [provider]  pantoprazole (PROTONIX) 40 MG tablet Take 1 tablet (40 mg total) by mouth daily. 07/28/17   Osvaldo ShipperKrishnan, Gokul, MD  tacrolimus (PROGRAF) 5 MG capsule Take 5 mg by mouth 2 (two) times daily.    [provider]    Physical Exam: Vitals:   10/18/17 1941 10/18/17 1942 10/18/17 2138 10/18/17 2343  BP: (!) 198/87  (!) 185/78 (!) 155/70  Pulse:  100 84 94  Resp:   (!) 23 20  Temp:      TempSrc:      SpO2:   100% 100%  Weight:          Constitutional: Moderately built and nourished. Vitals:   10/18/17 1941 10/18/17 1942 10/18/17 2138 10/18/17 2343  BP: (!) 198/87  (!) 185/78 (!) 155/70  Pulse:  100 84 94  Resp:   (!) 23 20  Temp:      TempSrc:      SpO2:   100% 100%  Weight:       Eyes: Anicteric no pallor. ENMT: No discharge from the ears eyes nose or mouth. Neck: No mass felt.  No JVD appreciated. Respiratory: No rhonchi or crepitations. Cardiovascular: S1-S2 heard no murmurs appreciated. Abdomen: Soft nontender bowel sounds present. Musculoskeletal: No edema.  No joint effusion. Skin: No rash.  Skin appears warm. Neurologic: Alert awake oriented to time place and person.  Moves all extremities. Psychiatric: Appears normal.  Normal affect.   Labs on Admission: I have personally reviewed following labs and imaging studies  CBC: Recent Labs  Lab 10/18/17 1611  WBC 16.6*  NEUTROABS 13.9*  HGB 16.7  HCT 48.6  MCV 80.2  PLT 181   Basic Metabolic Panel: Recent Labs  Lab 10/18/17 1611 10/18/17 2132  NA 128* 132*  K 5.7* 3.7  CL 92* 101  CO2 18* 20*  GLUCOSE 300* 183*  BUN 21* 20  CREATININE 1.07 0.78  CALCIUM 9.6 9.0   GFR: Estimated Creatinine Clearance: 95.8 mL/min (by C-G formula based on SCr of 0.78 mg/dL). Liver Function Tests: Recent Labs  Lab 10/18/17 1611  AST 29  ALT 16*  ALKPHOS 111  BILITOT 2.5*  PROT 7.8  ALBUMIN 4.6   Recent Labs  Lab 10/18/17 1611  LIPASE 24   No  results for input(s): AMMONIA in the last 168 hours. Coagulation Profile: No results for input(s): INR, PROTIME in the last 168 hours. Cardiac Enzymes: No results for input(s): CKTOTAL, CKMB, CKMBINDEX, TROPONINI in the last 168 hours. BNP (last 3 results) No results for input(s): PROBNP in the last 8760 hours. HbA1C: No results for input(s): HGBA1C in the last 72 hours. CBG: Recent Labs  Lab 10/18/17 1541 10/18/17 1746 10/18/17 1857 10/18/17 2138  GLUCAP 316* 292* 244* 184*   Lipid Profile: No results for input(s): CHOL, HDL, LDLCALC, TRIG, CHOLHDL, LDLDIRECT in the last 72 hours. Thyroid Function Tests: No results for input(s): TSH, T4TOTAL,  FREET4, T3FREE, THYROIDAB in the last 72 hours. Anemia Panel: No results for input(s): VITAMINB12, FOLATE, FERRITIN, TIBC, IRON, RETICCTPCT in the last 72 hours. Urine analysis:    Component Value Date/Time   COLORURINE STRAW (A) 10/18/2017 1825   APPEARANCEUR CLEAR 10/18/2017 1825   LABSPEC 1.023 10/18/2017 1825   PHURINE 5.0 10/18/2017 1825   GLUCOSEU >=500 (A) 10/18/2017 1825   HGBUR SMALL (A) 10/18/2017 1825   BILIRUBINUR NEGATIVE 10/18/2017 1825   KETONESUR 80 (A) 10/18/2017 1825   PROTEINUR 100 (A) 10/18/2017 1825   UROBILINOGEN 1.0 09/29/2013 2339   NITRITE NEGATIVE 10/18/2017 1825   LEUKOCYTESUR NEGATIVE 10/18/2017 1825   Sepsis Labs: @LABRCNTIP (procalcitonin:4,lacticidven:4) )No results found for this or any previous visit (from the past 240 hour(s)).   Radiological Exams on Admission: US Abdomen Limited Ruq  Result Date: 10/18/2017 CLINICAL DATA:  Right upper quadrant abdominal pain and nausea. EXAM: ULTRASOUND ABDOMEN LIMITED RIGHT UPPER QUADRANT COMPARISON:  None. FINDINGS: Gallbladder: No gallstones or wall thickening visualized. No sonographic Murphy sign noted by sonographer. Common bile duct: Diameter: Normal caliber of 3 mm. Liver: No focal lesion identified. Within normal limits in parenchymal echogenicity.  Portal vein is patent on color Doppler imaging with normal direction of blood flow towards the liver. IMPRESSION: Normal right upper quadrant abdominal ultrasound. Electronically Signed   By: Irish Lack M.D.   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 Intractable nausea vomiting -patient's abdomen appears benign on exam.  LFTs and lipase are normal.  Suspect likely from gastroparesis.  Will check urine drug screen to rule out any marijuana abuse.  I have placed patient on full liquid diet and hydration.  If symptoms does not improve may consider further imaging.  Patient's last hemoglobin A1c in October was around 10. 2. Hypertensive urgency -continue labetalol and lisinopril and amlodipine.  If patient cannot take it orally will keep patient on PRN IV labetalol. 3. Diabetes mellitus type 1 on Levemir -will decrease Levemir dose to 5 units for now until patient can reliably take orally.  Patient will be on sliding scale coverage.  Last hemoglobin A1c was around 10. 4. Renal and pancreatic transplant on immunosuppressant.  Patient was able to take his Prograf and tacrolimus in the hospital. 5. Peripheral vascular disease status post left BKA. 6. Hyperlipidemia on statins.  DVT prophylaxis: Lovenox. Code Status: Full code. Family Communication: Discussed with patient. Disposition Plan: Home. Consults called: None. Admission status: Observation.   Eduard Clos MD [facility] Pager [phone].  If 7PM-7AM, please contact night-coverage [email or web address] Password [email or web address]  10/18/2017, 11:53 PM

## 2017-10-18 NOTE — ED Notes (Signed)
Pt provided with more water

## 2017-10-18 NOTE — ED Notes (Signed)
Obtained CBG at 2133. Spoke with Erma HeritageIsaacs, MD @ 2209 and he stated the second CBG ordered for 2152 does not need to be obtained at this time. Cletis AthensJon, RN notified.

## 2017-10-18 NOTE — ED Notes (Signed)
Respiratory notified of VBG in mini lab 

## 2017-10-18 NOTE — ED Notes (Signed)
Per MD: Pt may have water. Pt provided with water at this time.

## 2017-10-18 NOTE — ED Notes (Signed)
Attempted 1 IV in left AC. Unsuccessful. Charge made aware of need for US guided IV.

## 2017-10-18 NOTE — ED Triage Notes (Signed)
Pt arrives EMS from home. Pt reports that he has had n/v for 3 days. Pt has been unable to take diabetic medication or medications for kidney transplant in 2009 due to vomiting. Pt denies diarrhea. Pt reports possible blood in emesis. Pt has a fistula in rt. Arm.

## 2017-10-19 ENCOUNTER — Other Ambulatory Visit: Payer: Self-pay

## 2017-10-19 DIAGNOSIS — T407X1A Poisoning by cannabis (derivatives), accidental (unintentional), initial encounter: Secondary | ICD-10-CM | POA: Diagnosis present

## 2017-10-19 DIAGNOSIS — H5462 Unqualified visual loss, left eye, normal vision right eye: Secondary | ICD-10-CM | POA: Diagnosis present

## 2017-10-19 DIAGNOSIS — Z794 Long term (current) use of insulin: Secondary | ICD-10-CM | POA: Diagnosis not present

## 2017-10-19 DIAGNOSIS — A084 Viral intestinal infection, unspecified: Secondary | ICD-10-CM | POA: Diagnosis present

## 2017-10-19 DIAGNOSIS — K3184 Gastroparesis: Secondary | ICD-10-CM | POA: Diagnosis present

## 2017-10-19 DIAGNOSIS — R1011 Right upper quadrant pain: Secondary | ICD-10-CM | POA: Diagnosis not present

## 2017-10-19 DIAGNOSIS — R112 Nausea with vomiting, unspecified: Secondary | ICD-10-CM | POA: Diagnosis present

## 2017-10-19 DIAGNOSIS — Z7982 Long term (current) use of aspirin: Secondary | ICD-10-CM | POA: Diagnosis not present

## 2017-10-19 DIAGNOSIS — E785 Hyperlipidemia, unspecified: Secondary | ICD-10-CM | POA: Diagnosis present

## 2017-10-19 DIAGNOSIS — Z940 Kidney transplant status: Secondary | ICD-10-CM | POA: Diagnosis not present

## 2017-10-19 DIAGNOSIS — K449 Diaphragmatic hernia without obstruction or gangrene: Secondary | ICD-10-CM | POA: Diagnosis not present

## 2017-10-19 DIAGNOSIS — E1043 Type 1 diabetes mellitus with diabetic autonomic (poly)neuropathy: Secondary | ICD-10-CM | POA: Diagnosis present

## 2017-10-19 DIAGNOSIS — E1065 Type 1 diabetes mellitus with hyperglycemia: Secondary | ICD-10-CM | POA: Diagnosis present

## 2017-10-19 DIAGNOSIS — K219 Gastro-esophageal reflux disease without esophagitis: Secondary | ICD-10-CM | POA: Diagnosis present

## 2017-10-19 DIAGNOSIS — I160 Hypertensive urgency: Secondary | ICD-10-CM | POA: Diagnosis present

## 2017-10-19 DIAGNOSIS — Z89512 Acquired absence of left leg below knee: Secondary | ICD-10-CM | POA: Diagnosis not present

## 2017-10-19 DIAGNOSIS — R1013 Epigastric pain: Secondary | ICD-10-CM | POA: Diagnosis present

## 2017-10-19 DIAGNOSIS — I10 Essential (primary) hypertension: Secondary | ICD-10-CM | POA: Diagnosis not present

## 2017-10-19 DIAGNOSIS — E1151 Type 2 diabetes mellitus with diabetic peripheral angiopathy without gangrene: Secondary | ICD-10-CM | POA: Diagnosis not present

## 2017-10-19 DIAGNOSIS — E1051 Type 1 diabetes mellitus with diabetic peripheral angiopathy without gangrene: Secondary | ICD-10-CM | POA: Diagnosis present

## 2017-10-19 DIAGNOSIS — E860 Dehydration: Secondary | ICD-10-CM | POA: Diagnosis not present

## 2017-10-19 DIAGNOSIS — Z9483 Pancreas transplant status: Secondary | ICD-10-CM | POA: Diagnosis not present

## 2017-10-19 DIAGNOSIS — R739 Hyperglycemia, unspecified: Secondary | ICD-10-CM | POA: Diagnosis not present

## 2017-10-19 DIAGNOSIS — I739 Peripheral vascular disease, unspecified: Secondary | ICD-10-CM | POA: Diagnosis not present

## 2017-10-19 DIAGNOSIS — E871 Hypo-osmolality and hyponatremia: Secondary | ICD-10-CM | POA: Diagnosis present

## 2017-10-19 LAB — CBG MONITORING, ED
Glucose-Capillary: 94 mg/dL (ref 65–99)
Glucose-Capillary: 149 mg/dL — ABNORMAL HIGH (ref 65–99)
Glucose-Capillary: 194 mg/dL — ABNORMAL HIGH (ref 65–99)

## 2017-10-19 LAB — HEPATIC FUNCTION PANEL
ALT: 12 U/L — ABNORMAL LOW (ref 17–63)
AST: 18 U/L (ref 15–41)
Albumin: 3.5 g/dL (ref 3.5–5.0)
Alkaline Phosphatase: 77 U/L (ref 38–126)
Bilirubin, Direct: 0.3 mg/dL (ref 0.1–0.5)
Indirect Bilirubin: 1.5 mg/dL — ABNORMAL HIGH (ref 0.3–0.9)
Total Bilirubin: 1.8 mg/dL — ABNORMAL HIGH (ref 0.3–1.2)
Total Protein: 5.8 g/dL — ABNORMAL LOW (ref 6.5–8.1)

## 2017-10-19 LAB — GLUCOSE, CAPILLARY
Glucose-Capillary: 201 mg/dL — ABNORMAL HIGH (ref 65–99)
Glucose-Capillary: 165 mg/dL — ABNORMAL HIGH (ref 65–99)
Glucose-Capillary: 130 mg/dL — ABNORMAL HIGH (ref 65–99)

## 2017-10-19 LAB — BASIC METABOLIC PANEL
Anion gap: 11 (ref 5–15)
BUN: 17 mg/dL (ref 6–20)
CO2: 19 mmol/L — ABNORMAL LOW (ref 22–32)
Calcium: 8.7 mg/dL — ABNORMAL LOW (ref 8.9–10.3)
Chloride: 103 mmol/L (ref 101–111)
Creatinine, Ser: 0.77 mg/dL (ref 0.61–1.24)
GFR calc Af Amer: >60 mL/min (ref >60)
GFR calc non Af Amer: >60 mL/min (ref >60)
Glucose, Bld: 178 mg/dL — ABNORMAL HIGH (ref 65–99)
Potassium: 3.9 mmol/L (ref 3.5–5.1)
Sodium: 133 mmol/L — ABNORMAL LOW (ref 135–145)

## 2017-10-19 LAB — RAPID URINE DRUG SCREEN, HOSP PERFORMED
Amphetamines: NOT DETECTED
Barbiturates: NOT DETECTED
Benzodiazepines: NOT DETECTED
Cocaine: NOT DETECTED
Opiates: NOT DETECTED
Tetrahydrocannabinol: POSITIVE — AB

## 2017-10-19 LAB — CBC
HCT: 41.2 % (ref 39.0–52.0)
Hemoglobin: 13.9 g/dL (ref 13.0–17.0)
MCH: 26.8 pg (ref 26.0–34.0)
MCHC: 33.7 g/dL (ref 30.0–36.0)
MCV: 79.5 fL (ref 78.0–100.0)
Platelets: 129 10*3/uL — ABNORMAL LOW (ref 150–400)
RBC: 5.18 MIL/uL (ref 4.22–5.81)
RDW: 13.9 % (ref 11.5–15.5)
WBC: 9.5 10*3/uL (ref 4.0–10.5)

## 2017-10-19 MED ORDER — HYDRALAZINE HCL 20 MG/ML IJ SOLN: 10.0000 mg | INTRAMUSCULAR | Status: DC | PRN

## 2017-10-19 MED ORDER — LABETALOL HCL 5 MG/ML IV SOLN
10.0000 mg | INTRAVENOUS | Status: DC | PRN
  Administered 2017-10-20: 10 mg via INTRAVENOUS
  Filled 2017-10-19: qty 4

## 2017-10-19 MED ORDER — INSULIN DETEMIR 100 UNIT/ML ~~LOC~~ SOLN
5.0000 [IU] | Freq: Two times a day (BID) | SUBCUTANEOUS | Status: DC
  Administered 2017-10-19 – 2017-10-21 (×5): 5 [IU] via SUBCUTANEOUS
  Filled 2017-10-19 (×5): qty 0.05

## 2017-10-19 MED ORDER — SODIUM CHLORIDE 0.9 % IV SOLN
INTRAVENOUS | Status: DC
  Administered 2017-10-19 (×2): via INTRAVENOUS

## 2017-10-19 NOTE — Care Management Note (Signed)
Case Management Note  Patient Details  Name: [name] MRN: [id number] Date of Birth: March 16, 1951  Subjective/Objective:  67 y/o m admitted w/nausea & vomiting.L leg BKA. From home w/spouse.                  Action/Plan:d/c plan home.   Expected Discharge Date:  (unknown)               Expected Discharge Plan:  Home/Self Care  In-House Referral:     Discharge planning Services  CM Consult  Post Acute Care Choice:  Durable Medical Equipment(L leg prosthetic) Choice offered to:     DME Arranged:    DME Agency:     HH Arranged:    HH Agency:     Status of Service:  In process, will continue to follow  If discussed at Long Length of Stay Meetings, dates discussed:    Additional Comments:  [name], [name] Mazzie, RN 10/19/2017, 4:41 PM

## 2017-10-19 NOTE — Progress Notes (Signed)
Patient arrived on unit via stretcher.  No family at bedside.  Telemetry placed per MD order and CMT notified.

## 2017-10-19 NOTE — Progress Notes (Signed)
PROGRESS NOTE    [name]  ZOX:[id number] DOB: 1951-01-07 DOA: 10/18/2017 PCP: Patient, No Pcp Per  Brief Narrative: [name] is a 67 y.o. 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  Patient has been having nausea vomiting for the last 2 days unable to keep in his medications down, including transplant medications, upon initial arrival in the emergency room his blood glucose was 300 with mildly elevated anion gap of 18 which corrected immediately following fluid bolus and insulin in the ED.  Assessment & Plan:   Principal Problem:   Nausea & vomiting -Suspect viral gastroenteritis,vs cyclical vomiting following cannabis use,  unable to rule out gastroparesis too -Clinically improving with supportive care, abdominal exam remains benign, LFTs and lipase are normal -Advance liquid diet -He was able to take his transplant immunosuppressants this morning   Hypertension/uncontrolled -Improving, start IV fluids now, resume labetalol, lisinopril and amlodipine  Diabetes mellitus type 1 -Resume Levemir at a lower dose, last hemoglobin A1c was 10  Renal and pancreatic transplant -Fortunately kidney function is normal -Restarted Prograf and tacrolimus, he was able to take these meds last night and this morning in the ED  Peripheral lasted disease -status post left BKA  DVT prophylaxis: Lovenox Code Status: Full code Family Communication: No family at bedside Disposition Plan: Home tomorrow if able to tolerate liquids  Consultants:   None   Procedures:   Antimicrobials:    Subjective: -still nauseated, no abd pain, no vomiting this am  Objective: Vitals:   10/19/17 0600 10/19/17 0618 10/19/17 0800 10/19/17 1000  BP: (!) 161/67 (!) 161/67 139/63   Pulse: 82 82 81   Resp: 17 17 17    Temp:      TempSrc:      SpO2: 95% 99% 98%     Weight:      Height:    5\' 10"  (1.778 m)    Intake/Output Summary (Last 24 hours) at 10/19/2017 1237 Last data filed at 10/19/2017 1004 Gross per 24 hour  Intake -  Output 600 ml  Net -600 ml   Filed Weights   10/18/17 1519  Weight: 72.6 kg (160 lb)    Examination:  General exam: Appears calm and comfortable,  flat affect, resting comfortably  Respiratory system: Clear to auscultation. Respiratory effort normal. Cardiovascular system: S1 & S2 heard, RRR. No JVD, murmurs, rubs, gallops  Gastrointestinal system: Abdomen is nondistended, soft and nontender.Normal bowel sounds heard. Central nervous system: Alert and oriented. No focal neurological deficits. Extremities: Left below-knee amputation  Skin: No rashes, lesions or ulcers Psychiatry: Judgement and insight appear normal. Flat affect    Data Reviewed:   CBC: Recent Labs  Lab 10/18/17 1611 10/19/17 0540  WBC 16.6* 9.5  NEUTROABS 13.9*  --   HGB 16.7 13.9  HCT 48.6 41.2  MCV 80.2 79.5  PLT 181 129*   Basic Metabolic Panel: Recent Labs  Lab 10/18/17 1611 10/18/17 2132 10/19/17 0540  NA 128* 132* 133*  K 5.7* 3.7 3.9  CL 92* 101 103  CO2 18* 20* 19*  GLUCOSE 300* 183* 178*  BUN 21* 20 17  CREATININE 1.07 0.78 0.77  CALCIUM 9.6 9.0 8.7*   GFR: Estimated Creatinine Clearance: 95.8 mL/min (by C-G formula based on SCr of 0.77 mg/dL). Liver Function Tests: Recent Labs  Lab 10/18/17 1611 10/19/17 0540  AST 29 18  ALT 16* 12*  ALKPHOS  111 77  BILITOT 2.5* 1.8*  PROT 7.8 5.8*  ALBUMIN 4.6 3.5   Recent Labs  Lab 10/18/17 1611  LIPASE 24   No results for input(s): AMMONIA in the last 168 hours. Coagulation Profile: No results for input(s): INR, PROTIME in the last 168 hours. Cardiac Enzymes: No results for input(s): CKTOTAL, CKMB, CKMBINDEX, TROPONINI in the last 168 hours. BNP (last 3 results) No results for input(s): PROBNP in the last 8760 hours. HbA1C: No results for input(s): HGBA1C in  the last 72 hours. CBG: Recent Labs  Lab 10/18/17 2138 10/19/17 0112 10/19/17 0426 10/19/17 0735 10/19/17 1152  GLUCAP 184* 94 149* 194* 201*   Lipid Profile: No results for input(s): CHOL, HDL, LDLCALC, TRIG, CHOLHDL, LDLDIRECT in the last 72 hours. Thyroid Function Tests: No results for input(s): TSH, T4TOTAL, FREET4, T3FREE, THYROIDAB in the last 72 hours. Anemia Panel: No results for input(s): VITAMINB12, FOLATE, FERRITIN, TIBC, IRON, RETICCTPCT in the last 72 hours. Urine analysis:    Component Value Date/Time   COLORURINE STRAW (A) 10/18/2017 1825   APPEARANCEUR CLEAR 10/18/2017 1825   LABSPEC 1.023 10/18/2017 1825   PHURINE 5.0 10/18/2017 1825   GLUCOSEU >=500 (A) 10/18/2017 1825   HGBUR SMALL (A) 10/18/2017 1825   BILIRUBINUR NEGATIVE 10/18/2017 1825   KETONESUR 80 (A) 10/18/2017 1825   PROTEINUR 100 (A) 10/18/2017 1825   UROBILINOGEN 1.0 09/29/2013 2339   NITRITE NEGATIVE 10/18/2017 1825   LEUKOCYTESUR NEGATIVE 10/18/2017 1825   Sepsis Labs: @LABRCNTIP (procalcitonin:4,lacticidven:4)  )No results found for this or any previous visit (from the past 240 hour(s)).       Radiology Studies: [place] Abdomen Limited Ruq  Result Date: 10/18/2017 CLINICAL DATA:  Right upper quadrant abdominal pain and nausea. EXAM: ULTRASOUND ABDOMEN LIMITED RIGHT UPPER QUADRANT COMPARISON:  None. FINDINGS: Gallbladder: No gallstones or wall thickening visualized. No sonographic Murphy sign noted by sonographer. Common bile duct: Diameter: Normal caliber of 3 mm. Liver: No focal lesion identified. Within normal limits in parenchymal echogenicity. Portal vein is patent on color Doppler imaging with normal direction of blood flow towards the liver. IMPRESSION: Normal right upper quadrant abdominal ultrasound. Electronically Signed   By: Irish LackGlenn  Yamagata M.D.   On: 10/18/2017 18:28        Scheduled Meds: . amLODipine  10 mg Oral Daily  . aspirin EC  81 mg Oral Daily  . atorvastatin  80 mg  Oral QHS  . enoxaparin (LOVENOX) injection  40 mg Subcutaneous Daily  . insulin aspart  0-9 Units Subcutaneous TID WC  . insulin detemir  5 Units Subcutaneous BID  . labetalol  200 mg Oral BID  . lisinopril  5 mg Oral Daily  . mycophenolate  1,000 mg Oral BID  . pantoprazole  40 mg Oral Daily  . tacrolimus  5 mg Oral BID   Continuous Infusions: . sodium chloride 75 mL/hr at 10/19/17 1123     LOS: 0 days    Time spent: 35min    Zannie CovePreetha Navy Belay, MD [facility] Page via [email or web address], password [email or web address] After 7PM please contact night-coverage  10/19/2017, 12:37 PM

## 2017-10-19 NOTE — Progress Notes (Signed)
Called ED for report waiting for RN to call back to give report

## 2017-10-19 NOTE — ED Notes (Signed)
RN at bedside obtaining labs

## 2017-10-20 ENCOUNTER — Encounter (HOSPITAL_COMMUNITY): Payer: Self-pay | Admitting: Radiology

## 2017-10-20 ENCOUNTER — Inpatient Hospital Stay (HOSPITAL_COMMUNITY): Payer: Medicare Other

## 2017-10-20 DIAGNOSIS — E1151 Type 2 diabetes mellitus with diabetic peripheral angiopathy without gangrene: Secondary | ICD-10-CM

## 2017-10-20 DIAGNOSIS — E860 Dehydration: Secondary | ICD-10-CM

## 2017-10-20 DIAGNOSIS — R739 Hyperglycemia, unspecified: Secondary | ICD-10-CM

## 2017-10-20 DIAGNOSIS — I10 Essential (primary) hypertension: Secondary | ICD-10-CM

## 2017-10-20 DIAGNOSIS — R112 Nausea with vomiting, unspecified: Secondary | ICD-10-CM

## 2017-10-20 LAB — GLUCOSE, CAPILLARY
Glucose-Capillary: 165 mg/dL — ABNORMAL HIGH (ref 65–99)
Glucose-Capillary: 145 mg/dL — ABNORMAL HIGH (ref 65–99)
Glucose-Capillary: 143 mg/dL — ABNORMAL HIGH (ref 65–99)
Glucose-Capillary: 183 mg/dL — ABNORMAL HIGH (ref 65–99)

## 2017-10-20 LAB — TACROLIMUS LEVEL

## 2017-10-20 MED ORDER — METOCLOPRAMIDE HCL 5 MG/ML IJ SOLN
5.0000 mg | Freq: Three times a day (TID) | INTRAMUSCULAR | Status: DC
  Administered 2017-10-20 – 2017-10-21 (×3): 5 mg via INTRAVENOUS
  Filled 2017-10-20 (×3): qty 2

## 2017-10-20 MED ORDER — SODIUM CHLORIDE 0.9 % IV SOLN
INTRAVENOUS | Status: DC
  Administered 2017-10-20 – 2017-10-21 (×2): via INTRAVENOUS

## 2017-10-20 MED ORDER — IOPAMIDOL (ISOVUE-300) INJECTION 61%: 15.0000 mL | Freq: Once | INTRAVENOUS | Status: AC | PRN

## 2017-10-20 MED ORDER — METOCLOPRAMIDE HCL 5 MG PO TABS: 5.0000 mg | ORAL_TABLET | Freq: Three times a day (TID) | ORAL | 0 refills | Status: DC

## 2017-10-20 MED ORDER — IOPAMIDOL (ISOVUE-300) INJECTION 61%
INTRAVENOUS | Status: AC
  Administered 2017-10-20: 100 mL
  Filled 2017-10-20: qty 100

## 2017-10-20 MED ORDER — IOPAMIDOL (ISOVUE-300) INJECTION 61%
INTRAVENOUS | Status: AC
  Administered 2017-10-20: 30 mL
  Filled 2017-10-20: qty 30

## 2017-10-20 NOTE — Progress Notes (Signed)
PROGRESS NOTE  [name] ZOX:[id number] DOB: 1951/06/26 DOA: 10/18/2017 PCP: [name]   LOS: 1 day   Brief Narrative / Interim history: [name] is a 67 y.o. male with history of diabetes mellitus type 1 status post renal and pancreatic transplant on immunosuppression, hypertension presents to the ER with complaints of nausea vomiting.  Patient has been having nausea vomiting for the last 2 days unable to keep in his medications.    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 viral gastroenteritis,vs cyclical vomiting following cannabis use, unable to rule out gastroparesis too -States that he is improving however also mentions that his abdominal pain is worse, worse after eating, and does not seem to be able to tolerate diet -AST and ALT were normal, bilirubin was slightly elevated.  Lipase was normal.  Right upper quadrant ultrasound without significant findings -Given persistent abdominal pain, will obtain CT scan of the abdomen and pelvis -Start Reglan  Hypertension/uncontrolled -Improving, continue IV fluids now, resume labetalol, lisinopril and amlodipine  Diabetes mellitus type 1 -Resume Levemir at a lower dose, last hemoglobin A1c was 10  Renal and pancreatic transplant -Fortunately kidney function is normal -Restarted Prograf and tacrolimus, he was able to take these meds last night and this morning in the ED  Peripheral lasted disease -status post left BKA    DVT prophylaxis: Lovenox Code Status: Full code Family Communication: no family at bedside Disposition Plan: home when ready   Consultants:   None   Procedures:   None  Antimicrobials:  None     Subjective: - no chest pain, shortness of breath, complains of epigastric abdominal pain with eating  Objective: Vitals:   10/19/17 1425 10/19/17 2100 10/20/17 0522 10/20/17 0628  BP: (!) 152/76 (!) 125/41 (!) 181/79 (!) 153/67  Pulse: 90 87 87 80  Resp: 16 17 17    Temp: 98.1 F (36.7 C) 98 F (36.7 C) 99 F (37.2 C)   TempSrc: Oral Oral Oral   SpO2: 100% 100% 100% 95%  Weight:      Height:        Intake/Output Summary (Last 24 hours) at 10/20/2017 1310 Last data filed at 10/20/2017 0849 Gross [name] 24 hour  Intake 1601.25 ml  Output 1600 ml  Net 1.25 ml   Filed Weights   10/18/17 1519  Weight: 72.6 kg (160 lb)    Examination:  Constitutional: NAD Eyes: lids and conjunctivae normal ENMT: Mucous membranes are moist.  Respiratory: clear to auscultation bilaterally, no wheezing, no crackles. Normal respiratory effort.  Cardiovascular: Regular rate and rhythm, no murmurs / rubs / gallops. No LE edema.  Abdomen: Tender to palpation in the epigastric and bilateral upper quadrants, no guarding, no rebound.  Bowel sounds positive. Musculoskeletal: no clubbing / cyanosis. No joint deformity upper and lower extremities.  Skin: no rashes Neurologic: CN 2-12 grossly intact. Strength 5/5 in all 4.  Psychiatric: Normal judgment and insight. Alert and oriented x 3. Normal mood.    Data Reviewed: I have independently reviewed following labs and imaging studies   CBC: Recent Labs  Lab 10/18/17 1611 10/19/17 0540  WBC 16.6* 9.5  NEUTROABS 13.9*  --   HGB 16.7 13.9  HCT 48.6 41.2  MCV 80.2 79.5  PLT 181 129*   Basic Metabolic Panel: Recent Labs  Lab 10/18/17 1611 10/18/17 2132 10/19/17 0540  NA 128* 132* 133*  K 5.7* 3.7 3.9  CL 92* 101 103  CO2 18* 20* 19*  GLUCOSE 300* 183* 178*  BUN 21* 20 17  CREATININE 1.07 0.78 0.77  CALCIUM 9.6 9.0 8.7*   GFR: Estimated Creatinine Clearance: 95.8 mL/min (by C-G formula based on SCr of 0.77 mg/dL). Liver Function  Tests: Recent Labs  Lab 10/18/17 1611 10/19/17 0540  AST 29 18  ALT 16* 12*  ALKPHOS 111 77  BILITOT 2.5* 1.8*  PROT 7.8 5.8*  ALBUMIN 4.6 3.5   Recent Labs  Lab 10/18/17 1611  LIPASE 24   No results for input(s): AMMONIA in the last 168 hours. Coagulation Profile: No results for input(s): INR, PROTIME in the last 168 hours. Cardiac Enzymes: No results for input(s): CKTOTAL, CKMB, CKMBINDEX, TROPONINI in the last 168 hours. BNP (last 3 results) No results for input(s): PROBNP in the last 8760 hours. HbA1C: No results for input(s): HGBA1C in the last 72 hours. CBG: Recent Labs  Lab 10/19/17 1152 10/19/17 1740 10/19/17 2104 10/20/17 0739 10/20/17 1138  GLUCAP 201* 165* 130* 165* 145*   Lipid Profile: No results for input(s): CHOL, HDL, LDLCALC, TRIG, CHOLHDL, LDLDIRECT in the last 72 hours. Thyroid Function Tests: No results for input(s): TSH, T4TOTAL, FREET4, T3FREE, THYROIDAB in the last 72 hours. Anemia Panel: No results for input(s): VITAMINB12, FOLATE, FERRITIN, TIBC, IRON, RETICCTPCT in the last 72 hours. Urine analysis:    Component Value Date/Time   COLORURINE STRAW (A) 10/18/2017 1825   APPEARANCEUR CLEAR 10/18/2017 1825   LABSPEC 1.023 10/18/2017 1825   PHURINE 5.0 10/18/2017 1825   GLUCOSEU >=500 (A) 10/18/2017 1825   HGBUR SMALL (A) 10/18/2017 1825   BILIRUBINUR NEGATIVE 10/18/2017 1825   KETONESUR 80 (A) 10/18/2017 1825   PROTEINUR 100 (A) 10/18/2017 1825   UROBILINOGEN 1.0 09/29/2013 2339   NITRITE NEGATIVE 10/18/2017 1825   LEUKOCYTESUR NEGATIVE 10/18/2017 1825   Sepsis Labs: Invalid input(s): PROCALCITONIN, LACTICIDVEN  No results found for this or any previous visit (from the past 240 hour(s)).    Radiology Studies: [place] Abdomen Limited Ruq  Result Date: 10/18/2017 CLINICAL DATA:  Right upper quadrant abdominal pain and nausea. EXAM: ULTRASOUND ABDOMEN LIMITED RIGHT UPPER QUADRANT COMPARISON:  None. FINDINGS: Gallbladder: No gallstones  or wall thickening visualized. No sonographic Murphy sign noted by sonographer. Common bile duct: Diameter: Normal caliber of 3 mm. Liver: No focal lesion identified. Within normal limits in parenchymal echogenicity. Portal vein is patent on color Doppler imaging with normal direction of blood flow towards the liver. IMPRESSION: Normal right upper quadrant abdominal ultrasound. Electronically Signed   By: Irish Lack M.D.   On: 10/18/2017 18:28     Scheduled Meds: . amLODipine  10 mg Oral Daily  . aspirin EC  81 mg Oral Daily  . atorvastatin  80 mg Oral QHS  . enoxaparin (LOVENOX) injection  40 mg Subcutaneous Daily  . insulin aspart  0-9 Units Subcutaneous TID WC  . insulin detemir  5 Units Subcutaneous BID  . labetalol  200 mg Oral BID  . lisinopril  5 mg Oral Daily  . mycophenolate  1,000 mg Oral BID  . pantoprazole  40 mg Oral Daily  . tacrolimus  5 mg Oral BID   Continuous Infusions:   Pamella Pert, MD, PhD [facility] Pager [phone]  If 7PM-7AM, please contact night-coverage [email or web address] Password [email or web address] 10/20/2017, 1:10 PM

## 2017-10-20 NOTE — Care Management Note (Signed)
Case Management Note  Patient Details  Name: [name] DikeClarence [name] MRN: [name] Date of Birth: 11-Mar-1951  Subjective/Objective:  No CM needs.                  Action/Plan:d/c home.   Expected Discharge Date:  10/20/17               Expected Discharge Plan:  Home/Self Care  In-House Referral:     Discharge planning Services  CM Consult  Post Acute Care Choice:  Durable Medical Equipment(L leg prosthetic) Choice offered to:     DME Arranged:    DME Agency:     HH Arranged:    HH Agency:     Status of Service:  Completed, signed off  If discussed at [employer] Length of Stay Meetings, dates discussed:    Additional Comments:  Lanier ClamMahabir, Maretta Overdorf, RN 10/20/2017, 9:49 AM

## 2017-10-20 NOTE — Progress Notes (Signed)
Patient unable to eat lunch d/t pain. Patient ate a few bite of peaches.  Dr. Elvera LennoxGherghe notified via text page.

## 2017-10-21 DIAGNOSIS — I739 Peripheral vascular disease, unspecified: Secondary | ICD-10-CM

## 2017-10-21 DIAGNOSIS — R1011 Right upper quadrant pain: Secondary | ICD-10-CM

## 2017-10-21 LAB — GLUCOSE, CAPILLARY: Glucose-Capillary: 182 mg/dL — ABNORMAL HIGH (ref 65–99)

## 2017-10-21 NOTE — Discharge Instructions (Signed)
Follow with Primary MD in 1-2 weeks  Please get a complete blood count and chemistry panel checked by your Primary MD at your next visit, and again as instructed by your Primary MD. Please get your medications reviewed and adjusted by your Primary MD.  Please request your Primary MD to go over all Hospital Tests and Procedure/Radiological results at the follow up, please get all Hospital records sent to your Prim MD by signing hospital release before you go home.  If you had Pneumonia of Lung problems at the Hospital: Please get a 2 view Chest X ray done in 6-8 weeks after hospital discharge or sooner if instructed by your Primary MD.  If you have Congestive Heart Failure: Please call your Cardiologist or Primary MD anytime you have any of the following symptoms:  1) 3 pound weight gain in 24 hours or 5 pounds in 1 week  2) shortness of breath, with or without a dry hacking cough  3) swelling in the hands, feet or stomach  4) if you have to sleep on extra pillows at night in order to breathe  Follow cardiac low salt diet and 1.5 lit/day fluid restriction.  If you have diabetes Accuchecks 4 times/day, Once in AM empty stomach and then before each meal. Log in all results and show them to your primary doctor at your next visit. If any glucose reading is under 80 or above 300 call your primary MD immediately.  If you have Seizure/Convulsions/Epilepsy: Please do not drive, operate heavy machinery, participate in activities at heights or participate in high speed sports until you have seen by Primary MD or a Neurologist and advised to do so again.  If you had Gastrointestinal Bleeding: Please ask your Primary MD to check a complete blood count within one week of discharge or at your next visit. Your endoscopic/colonoscopic biopsies that are pending at the time of discharge, will also need to followed by your Primary MD.  Get Medicines reviewed and adjusted. Please take all your medications  with you for your next visit with your Primary MD  Please request your Primary MD to go over all hospital tests and procedure/radiological results at the follow up, please ask your Primary MD to get all Hospital records sent to his/her office.  If you experience worsening of your admission symptoms, develop shortness of breath, life threatening emergency, suicidal or homicidal thoughts you must seek medical attention immediately by calling 911 or calling your MD immediately  if symptoms less severe.  You must read complete instructions/literature along with all the possible adverse reactions/side effects for all the Medicines you take and that have been prescribed to you. Take any new Medicines after you have completely understood and accpet all the possible adverse reactions/side effects.   Do not drive or operate heavy machinery when taking Pain medications.   Do not take more than prescribed Pain, Sleep and Anxiety Medications  Special Instructions: If you have smoked or chewed Tobacco  in the last 2 yrs please stop smoking, stop any regular Alcohol  and or any Recreational drug use.  Wear Seat belts while driving.  Please note You were cared for by a hospitalist during your hospital stay. If you have any questions about your discharge medications or the care you received while you were in the hospital after you are discharged, you can call the unit and asked to speak with the hospitalist on call if the hospitalist that took care of you is not available. Once you  are discharged, your primary care physician will handle any further medical issues. Please note that NO REFILLS for any discharge medications will be authorized once you are discharged, as it is imperative that you return to your primary care physician (or establish a relationship with a primary care physician if you do not have one) for your aftercare needs so that they can reassess your need for medications and monitor your lab  values.  You can reach the hospitalist office at phone [phone] or fax [phone]   If you do not have a primary care physician, you can call [phone] for a physician referral.  Activity: As tolerated with Full fall precautions use walker/cane & assistance as needed  Diet: diabetic  Disposition Home

## 2017-10-21 NOTE — Progress Notes (Signed)
10/21/17  1103  Reviewed discharge instructions with patient. Patient verbalized understanding of discharge instructions. Copy of discharge instructions and prescription given to patient.

## 2017-10-21 NOTE — Plan of Care (Signed)
  Nutrition: Adequate nutrition will be maintained 10/21/2017 0755 - Progressing by William Daltonarpenter, Tamryn Popko L, RN   Pain Managment: General experience of comfort will improve 10/21/2017 0755 - Progressing by William Daltonarpenter, Brok Stocking L, RN   Safety: Ability to remain free from injury will improve 10/21/2017 0755 - Progressing by William Daltonarpenter, Lyndsey Demos L, RN

## 2017-10-21 NOTE — Discharge Summary (Signed)
Physician Discharge Summary  [name] ZOX:[id number] DOB: Nov 06, 1950 DOA: 10/18/2017  PCP: Patient, No Pcp Per  Admit date: 10/18/2017 Discharge date: 10/21/2017  Admitted From: home Disposition:  home  Recommendations for Outpatient Follow-up:  1. Follow up with PCP in 1-2 weeks  Home Health: none Equipment/Devices: none  Discharge Condition: stable CODE STATUS: Full code Diet recommendation: diabetic  HPI: Per Dr. Gilford [name], [name] is a 67 y.o. 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  Patient has been having nausea vomiting for the last 2 days unable to keep in his medications.  Denies any diarrhea or abdominal pain. ED Course: In the ER patient also was found to have elevated blood pressure.  Was given labetalol IV.  Initial labs showed blood sugar around 300 with anion gap of 18 and bicarb was low.  Improved with fluid bolus and anion gap is corrected.  Patient subsequently had low normal blood sugar around 96 after being given subcutaneous NovoLog 5 units.  Patient also was given antiemetics.  Patient is being admitted for intractable nausea vomiting.  Abdomen on exam appears benign.  Patient symptoms most likely from gastroparesis.  Sonogram of the right upper quadrant was unremarkable.   Hospital Course: Nausea &vomiting /  Abdominal pain -? viral gastroenteritis,vscyclical vomiting following cannabis use,unable to rule out gastroparesis too, he improved with conservative measures, his diet was advanced and now able to tolerate a regular diet. He will be discharged home in stable condition.  AST and ALT were normal, bilirubin was slightly elevated but improving.  Lipase was normal.  Right upper quadrant ultrasound without significant findings, also underwent a CT scan of abdomen and pelvis which was negative.   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   aspirin EC 81 MG tablet Take 81 mg by mouth daily.   atorvastatin 80 MG tablet Commonly known as:  LIPITOR Take 80 mg by mouth at bedtime.   furosemide 20 MG tablet Commonly known as:  LASIX Take 1 tablet (20 mg total) by mouth daily.   insulin detemir 100 UNIT/ML injection Commonly known as:  LEVEMIR Inject 0.15 mLs (15 Units total) into the skin 2 (two) times daily.   labetalol 100 MG tablet Commonly known as:  NORMODYNE Take 2 tablets (200 mg total) by mouth 2 (two) times daily.   lisinopril 5 MG tablet Commonly known as:  PRINIVIL,ZESTRIL Take 1 tablet (5 mg total) by mouth daily.   metoCLOPramide 5 MG tablet Commonly known as:  REGLAN Take 1 tablet (5 mg total) by mouth 3 (three) times daily for 3 days.   mycophenolate 250 MG capsule Commonly known as:  CELLCEPT Take 1,000 mg by mouth 2 (two) times  daily.   pantoprazole 40 MG tablet Commonly known as:  PROTONIX Take 1 tablet (40 mg total) by mouth daily.   tacrolimus 5 MG capsule Commonly known as:  PROGRAF Take 5 mg by mouth 2 (two) times daily.       Consultations:  None   Procedures/Studies:  Ct Abdomen Pelvis W Contrast  Result Date: 10/20/2017 CLINICAL  DATA:  67 year old male with history of diabetes type 1 status post renal and pancreatic transplant on immunosuppression presents to the ER with complaints of nausea and vomiting for the past 2 days. EXAM: CT ABDOMEN AND PELVIS WITH CONTRAST TECHNIQUE: Multidetector CT imaging of the abdomen and pelvis was performed using the standard protocol following bolus administration of intravenous contrast. CONTRAST:  ISOVUE-300 IOPAMIDOL (ISOVUE-300) INJECTION 61%, 30mL ISOVUE-300 IOPAMIDOL (ISOVUE-300) INJECTION 61% COMPARISON:  02/01/2016 CT FINDINGS: Lower chest: Normal heart size without pericardial effusion. Subsegmental atelectasis at each lung base. No effusion or pneumothorax. Hepatobiliary: Homogeneous attenuation of the liver apart from mild fatty infiltration or perfusion anomaly accounting for subtle hypodensity adjacent to the falciform ligament. No biliary dilatation. Normal gallbladder without stones. Pancreas: Stable appearance without mass or ductal dilatation. 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 Atrophic bilateral native kidneys. No obstructive uropathy. Mild ectasia of the collecting system of the left transplanted kidney without obstructive uropathy. There is urinary opacification on repeat delayed imaging through the transplanted kidney. The urinary bladder is physiologically distended without focal mural thickening or calculus. Stomach/Bowel: Small hiatal hernia. Physiologic contrast filled stomach with normal small bowel rotation. No bowel obstruction or inflammation. Appendix not visualized but no pericecal inflammatory change noted to suggest appendicitis. Vascular/Lymphatic: Moderate aortoiliac and branch vessel atherosclerosis without aneurysm. Patent splenic and portal vein. No adenopathy. Reproductive: Normal size prostate and seminal vesicles. Other: No free air nor free fluid. Musculoskeletal: No acute nor significant  skeletal findings. IMPRESSION: 1. No acute abnormality within the abdomen or pelvis. 2. Stable appearance of the transplanted pancreas and left pelvic kidney. Electronically Signed   By: Tollie Eth M.D.   On: 10/20/2017 16:09   US Abdomen Limited Ruq  Result Date: 10/18/2017 CLINICAL DATA:  Right upper quadrant abdominal pain and nausea. EXAM: ULTRASOUND ABDOMEN LIMITED RIGHT UPPER QUADRANT COMPARISON:  None. FINDINGS: Gallbladder: No gallstones or wall thickening visualized. No sonographic Murphy sign noted by sonographer. Common bile duct: Diameter: Normal caliber of 3 mm. Liver: No focal lesion identified. Within normal limits in parenchymal echogenicity. Portal vein is patent on color Doppler imaging with normal direction of blood flow towards the liver. IMPRESSION: Normal right upper quadrant abdominal ultrasound. Electronically Signed   By: Irish Lack M.D.   On: 10/18/2017 18:28      Subjective: - no chest pain, shortness of breath, no abdominal pain, nausea or vomiting.   Discharge Exam: Vitals:   10/21/17 0446 10/21/17 1029  BP: (!) 125/55 118/63  Pulse: 81 81  Resp: 20 20  Temp: 99.1 F (37.3 C)   SpO2: 100% 98%    General: Pt is alert, awake, not in acute distress Cardiovascular: RRR, S1/S2 +, no rubs, no gallops Respiratory: CTA bilaterally, no wheezing, no rhonchi Abdominal: Soft, NT, ND, bowel sounds + Extremities: no edema, no cyanosis    The results of significant diagnostics from this hospitalization (including imaging, microbiology, ancillary and laboratory) are listed below for reference.     Microbiology: No results found for this or any previous visit (from the past 240 hour(s)).   Labs:  BNP (last 3 results) No results for input(s): BNP in the last 8760 hours. Basic Metabolic Panel: Recent Labs  Lab 10/18/17 1611 10/18/17 2132 10/19/17 0540  NA 128* 132* 133*  K 5.7* 3.7 3.9  CL 92* 101 103  CO2 18* 20* 19*  GLUCOSE 300* 183* 178*  BUN 21*  20 17  CREATININE 1.07 0.78 0.77  CALCIUM 9.6 9.0 8.7*   Liver Function Tests: Recent Labs  Lab 10/18/17 1611 10/19/17 0540  AST 29 18  ALT 16* 12*  ALKPHOS 111 77  BILITOT 2.5* 1.8*  PROT 7.8 5.8*  ALBUMIN 4.6 3.5   Recent Labs  Lab 10/18/17 1611  LIPASE 24   No results for input(s): AMMONIA in the last 168 hours. CBC: Recent Labs  Lab 10/18/17 1611 10/19/17 0540  WBC 16.6* 9.5  NEUTROABS 13.9*  --   HGB 16.7 13.9  HCT 48.6 41.2  MCV 80.2 79.5  PLT 181 129*   Cardiac Enzymes: No results for input(s): CKTOTAL, CKMB, CKMBINDEX, TROPONINI in the last 168 hours. BNP: Invalid input(s): POCBNP CBG: Recent Labs  Lab 10/20/17 0739 10/20/17 1138 10/20/17 1620 10/20/17 2027 10/21/17 0713  GLUCAP 165* 145* 143* 183* 182*   D-Dimer No results for input(s): DDIMER in the last 72 hours. Hgb A1c No results for input(s): HGBA1C in the last 72 hours. Lipid Profile No results for input(s): CHOL, HDL, LDLCALC, TRIG, CHOLHDL, LDLDIRECT in the last 72 hours. Thyroid function studies No results for input(s): TSH, T4TOTAL, T3FREE, THYROIDAB in the last 72 hours.  Invalid input(s): FREET3 Anemia work up No results for input(s): VITAMINB12, FOLATE, FERRITIN, TIBC, IRON, RETICCTPCT in the last 72 hours. Urinalysis    Component Value Date/Time   COLORURINE STRAW (A) 10/18/2017 1825   APPEARANCEUR CLEAR 10/18/2017 1825   LABSPEC 1.023 10/18/2017 1825   PHURINE 5.0 10/18/2017 1825   GLUCOSEU >=500 (A) 10/18/2017 1825   HGBUR SMALL (A) 10/18/2017 1825   BILIRUBINUR NEGATIVE 10/18/2017 1825   KETONESUR 80 (A) 10/18/2017 1825   PROTEINUR 100 (A) 10/18/2017 1825   UROBILINOGEN 1.0 09/29/2013 2339   NITRITE NEGATIVE 10/18/2017 1825   LEUKOCYTESUR NEGATIVE 10/18/2017 1825   Sepsis Labs Invalid input(s): PROCALCITONIN,  WBC,  LACTICIDVEN   Time coordinating discharge: 35 minutes  SIGNED:  Pamella Pertostin Lyanna Blystone, MD  [facility] 10/21/2017, 11:20 AM Pager  [phone]  If 7PM-7AM, please contact night-coverage [email or web address] Password [email or web address]

## 2017-10-22 LAB — TACROLIMUS LEVEL: Tacrolimus (FK506) - LabCorp: 14 ng/mL (ref 2.0–20.0)

## 2018-01-04 ENCOUNTER — Emergency Department (HOSPITAL_COMMUNITY): Payer: Medicare Other

## 2018-01-04 ENCOUNTER — Encounter (HOSPITAL_COMMUNITY): Payer: Self-pay

## 2018-01-04 ENCOUNTER — Other Ambulatory Visit: Payer: Self-pay

## 2018-01-04 ENCOUNTER — Inpatient Hospital Stay (HOSPITAL_COMMUNITY)
Admission: EM | Admit: 2018-01-04 | Discharge: 2018-01-07 | DRG: 638 | Disposition: A | Discharge status: Home / Self Care | Payer: Medicare Other | Attending: Internal Medicine | Admitting: Internal Medicine

## 2018-01-04 DIAGNOSIS — E1051 Type 1 diabetes mellitus with diabetic peripheral angiopathy without gangrene: Secondary | ICD-10-CM | POA: Diagnosis present

## 2018-01-04 DIAGNOSIS — Z23 Encounter for immunization: Secondary | ICD-10-CM | POA: Diagnosis not present

## 2018-01-04 DIAGNOSIS — E1010 Type 1 diabetes mellitus with ketoacidosis without coma: Secondary | ICD-10-CM | POA: Diagnosis present

## 2018-01-04 DIAGNOSIS — Z89512 Acquired absence of left leg below knee: Secondary | ICD-10-CM

## 2018-01-04 DIAGNOSIS — E871 Hypo-osmolality and hyponatremia: Secondary | ICD-10-CM | POA: Diagnosis not present

## 2018-01-04 DIAGNOSIS — R112 Nausea with vomiting, unspecified: Secondary | ICD-10-CM | POA: Diagnosis not present

## 2018-01-04 DIAGNOSIS — R000 Tachycardia, unspecified: Secondary | ICD-10-CM | POA: Diagnosis not present

## 2018-01-04 DIAGNOSIS — H548 Legal blindness, as defined in USA: Secondary | ICD-10-CM | POA: Diagnosis present

## 2018-01-04 DIAGNOSIS — Z7982 Long term (current) use of aspirin: Secondary | ICD-10-CM

## 2018-01-04 DIAGNOSIS — Z940 Kidney transplant status: Secondary | ICD-10-CM

## 2018-01-04 DIAGNOSIS — E1365 Other specified diabetes mellitus with hyperglycemia: Secondary | ICD-10-CM | POA: Diagnosis not present

## 2018-01-04 DIAGNOSIS — Z9483 Pancreas transplant status: Secondary | ICD-10-CM | POA: Diagnosis not present

## 2018-01-04 DIAGNOSIS — E860 Dehydration: Secondary | ICD-10-CM | POA: Diagnosis not present

## 2018-01-04 DIAGNOSIS — E876 Hypokalemia: Secondary | ICD-10-CM | POA: Diagnosis present

## 2018-01-04 DIAGNOSIS — D72829 Elevated white blood cell count, unspecified: Secondary | ICD-10-CM | POA: Diagnosis present

## 2018-01-04 DIAGNOSIS — K219 Gastro-esophageal reflux disease without esophagitis: Secondary | ICD-10-CM | POA: Diagnosis present

## 2018-01-04 DIAGNOSIS — Z87891 Personal history of nicotine dependence: Secondary | ICD-10-CM

## 2018-01-04 DIAGNOSIS — I10 Essential (primary) hypertension: Secondary | ICD-10-CM | POA: Diagnosis present

## 2018-01-04 DIAGNOSIS — Z89519 Acquired absence of unspecified leg below knee: Secondary | ICD-10-CM

## 2018-01-04 DIAGNOSIS — Z79899 Other long term (current) drug therapy: Secondary | ICD-10-CM

## 2018-01-04 DIAGNOSIS — G4730 Sleep apnea, unspecified: Secondary | ICD-10-CM | POA: Diagnosis present

## 2018-01-04 LAB — CBC
HCT: 50.7 % (ref 39.0–52.0)
Hemoglobin: 17.3 g/dL — ABNORMAL HIGH (ref 13.0–17.0)
MCH: 27 pg (ref 26.0–34.0)
MCHC: 34.1 g/dL (ref 30.0–36.0)
MCV: 79.1 fL (ref 78.0–100.0)
Platelets: 186 10*3/uL (ref 150–400)
RBC: 6.41 MIL/uL — ABNORMAL HIGH (ref 4.22–5.81)
RDW: 13.5 % (ref 11.5–15.5)
WBC: 14.9 10*3/uL — ABNORMAL HIGH (ref 4.0–10.5)

## 2018-01-04 LAB — I-STAT CG4 LACTIC ACID, ED: Lactic Acid, Venous: 2.64 mmol/L (ref 0.5–1.9)

## 2018-01-04 LAB — LIPASE, BLOOD: Lipase: 26 U/L (ref 11–51)

## 2018-01-04 LAB — COMPREHENSIVE METABOLIC PANEL
ALT: 13 U/L — ABNORMAL LOW (ref 17–63)
AST: 13 U/L — ABNORMAL LOW (ref 15–41)
Albumin: 4.8 g/dL (ref 3.5–5.0)
Alkaline Phosphatase: 123 U/L (ref 38–126)
Anion gap: 18 — ABNORMAL HIGH (ref 5–15)
BUN: 24 mg/dL — ABNORMAL HIGH (ref 6–20)
CO2: 20 mmol/L — ABNORMAL LOW (ref 22–32)
Calcium: 10.1 mg/dL (ref 8.9–10.3)
Chloride: 90 mmol/L — ABNORMAL LOW (ref 101–111)
Creatinine, Ser: 1.09 mg/dL (ref 0.61–1.24)
GFR calc Af Amer: >60 mL/min (ref >60)
GFR calc non Af Amer: >60 mL/min (ref >60)
Glucose, Bld: 321 mg/dL — ABNORMAL HIGH (ref 65–99)
Potassium: 4 mmol/L (ref 3.5–5.1)
Sodium: 128 mmol/L — ABNORMAL LOW (ref 135–145)
Total Bilirubin: 1.7 mg/dL — ABNORMAL HIGH (ref 0.3–1.2)
Total Protein: 8 g/dL (ref 6.5–8.1)

## 2018-01-04 LAB — CBG MONITORING, ED: Glucose-Capillary: 361 mg/dL — ABNORMAL HIGH (ref 65–99)

## 2018-01-04 MED ORDER — ONDANSETRON 4 MG PO TBDP
4.0000 mg | ORAL_TABLET | Freq: Once | ORAL | Status: AC | PRN
  Administered 2018-01-04: 4 mg via ORAL
  Filled 2018-01-04: qty 1

## 2018-01-04 MED ORDER — ONDANSETRON HCL 4 MG/2ML IJ SOLN
4.0000 mg | Freq: Once | INTRAMUSCULAR | Status: AC
  Administered 2018-01-05: 4 mg via INTRAVENOUS
  Filled 2018-01-04: qty 2

## 2018-01-04 MED ORDER — SODIUM CHLORIDE 0.9 % IV BOLUS
1000.0000 mL | Freq: Once | INTRAVENOUS | Status: AC
  Administered 2018-01-04: 1000 mL via INTRAVENOUS

## 2018-01-04 NOTE — ED Notes (Signed)
CBG taken at 21:31 was 361.

## 2018-01-04 NOTE — ED Triage Notes (Signed)
Pt BIB [facility] for eval of nausea, vomiting and generalized weakness since Sunday. Pt reports beginning to feel poorly on Sunday, started w/ nausea and fatigue at that time. Pt reports progressively worsening, unable to tolerate PO intake. Pt reports hx of DMII, renal and pancreatic transplant x 10 years.

## 2018-01-05 DIAGNOSIS — K219 Gastro-esophageal reflux disease without esophagitis: Secondary | ICD-10-CM | POA: Diagnosis not present

## 2018-01-05 DIAGNOSIS — E860 Dehydration: Secondary | ICD-10-CM

## 2018-01-05 DIAGNOSIS — E1010 Type 1 diabetes mellitus with ketoacidosis without coma: Secondary | ICD-10-CM | POA: Diagnosis present

## 2018-01-05 DIAGNOSIS — E1051 Type 1 diabetes mellitus with diabetic peripheral angiopathy without gangrene: Secondary | ICD-10-CM | POA: Diagnosis present

## 2018-01-05 DIAGNOSIS — G4730 Sleep apnea, unspecified: Secondary | ICD-10-CM | POA: Diagnosis not present

## 2018-01-05 DIAGNOSIS — Z9483 Pancreas transplant status: Secondary | ICD-10-CM | POA: Diagnosis not present

## 2018-01-05 DIAGNOSIS — Z87891 Personal history of nicotine dependence: Secondary | ICD-10-CM | POA: Diagnosis not present

## 2018-01-05 DIAGNOSIS — Z89512 Acquired absence of left leg below knee: Secondary | ICD-10-CM | POA: Diagnosis not present

## 2018-01-05 DIAGNOSIS — I10 Essential (primary) hypertension: Secondary | ICD-10-CM | POA: Diagnosis not present

## 2018-01-05 DIAGNOSIS — E871 Hypo-osmolality and hyponatremia: Secondary | ICD-10-CM | POA: Diagnosis not present

## 2018-01-05 DIAGNOSIS — D72829 Elevated white blood cell count, unspecified: Secondary | ICD-10-CM | POA: Diagnosis not present

## 2018-01-05 DIAGNOSIS — Z89519 Acquired absence of unspecified leg below knee: Secondary | ICD-10-CM

## 2018-01-05 DIAGNOSIS — Z79899 Other long term (current) drug therapy: Secondary | ICD-10-CM | POA: Diagnosis not present

## 2018-01-05 DIAGNOSIS — Z940 Kidney transplant status: Secondary | ICD-10-CM | POA: Diagnosis not present

## 2018-01-05 DIAGNOSIS — R112 Nausea with vomiting, unspecified: Secondary | ICD-10-CM | POA: Diagnosis not present

## 2018-01-05 DIAGNOSIS — Z7982 Long term (current) use of aspirin: Secondary | ICD-10-CM | POA: Diagnosis not present

## 2018-01-05 DIAGNOSIS — H548 Legal blindness, as defined in USA: Secondary | ICD-10-CM | POA: Diagnosis present

## 2018-01-05 DIAGNOSIS — Z23 Encounter for immunization: Secondary | ICD-10-CM | POA: Diagnosis not present

## 2018-01-05 DIAGNOSIS — E876 Hypokalemia: Secondary | ICD-10-CM | POA: Diagnosis not present

## 2018-01-05 LAB — I-STAT CG4 LACTIC ACID, ED: Lactic Acid, Venous: 2.63 mmol/L (ref 0.5–1.9)

## 2018-01-05 LAB — URINALYSIS, ROUTINE W REFLEX MICROSCOPIC
Bacteria, UA: NONE SEEN
Bilirubin Urine: NEGATIVE
Glucose, UA: >=500 mg/dL — AB
Ketones, ur: 80 mg/dL — AB
Leukocytes, UA: NEGATIVE
Nitrite: NEGATIVE
Protein, ur: 100 mg/dL — AB
RBC / HPF: NONE SEEN RBC/hpf (ref 0–5)
Specific Gravity, Urine: 1.029 (ref 1.005–1.030)
Squamous Epithelial / LPF: NONE SEEN
pH: 6 (ref 5.0–8.0)

## 2018-01-05 LAB — I-STAT VENOUS BLOOD GAS, ED
Acid-base deficit: 6 mmol/L — ABNORMAL HIGH (ref 0.0–2.0)
Bicarbonate: 20.5 mmol/L (ref 20.0–28.0)
O2 Saturation: 57 %
TCO2: 22 mmol/L (ref 22–32)
pCO2, Ven: 41.3 mmHg — ABNORMAL LOW (ref 44.0–60.0)
pH, Ven: 7.305 (ref 7.250–7.430)
pO2, Ven: 32 mmHg (ref 32.0–45.0)

## 2018-01-05 LAB — I-STAT TROPONIN, ED: Troponin i, poc: 0.02 ng/mL (ref 0.00–0.08)

## 2018-01-05 LAB — CBG MONITORING, ED
Glucose-Capillary: 204 mg/dL — ABNORMAL HIGH (ref 65–99)
Glucose-Capillary: 211 mg/dL — ABNORMAL HIGH (ref 65–99)
Glucose-Capillary: 218 mg/dL — ABNORMAL HIGH (ref 65–99)
Glucose-Capillary: 219 mg/dL — ABNORMAL HIGH (ref 65–99)
Glucose-Capillary: 236 mg/dL — ABNORMAL HIGH (ref 65–99)
Glucose-Capillary: 316 mg/dL — ABNORMAL HIGH (ref 65–99)

## 2018-01-05 LAB — BASIC METABOLIC PANEL
Anion gap: 14 (ref 5–15)
BUN: 20 mg/dL (ref 6–20)
CO2: 17 mmol/L — ABNORMAL LOW (ref 22–32)
Calcium: 7 mg/dL — ABNORMAL LOW (ref 8.9–10.3)
Chloride: 102 mmol/L (ref 101–111)
Creatinine, Ser: 0.83 mg/dL (ref 0.61–1.24)
GFR calc Af Amer: >60 mL/min (ref >60)
GFR calc non Af Amer: >60 mL/min (ref >60)
Glucose, Bld: 189 mg/dL — ABNORMAL HIGH (ref 65–99)
Potassium: 3.2 mmol/L — ABNORMAL LOW (ref 3.5–5.1)
Sodium: 133 mmol/L — ABNORMAL LOW (ref 135–145)

## 2018-01-05 LAB — I-STAT CHEM 8, ED
BUN: 27 mg/dL — ABNORMAL HIGH (ref 6–20)
Calcium, Ion: 1.09 mmol/L — ABNORMAL LOW (ref 1.15–1.40)
Chloride: 99 mmol/L — ABNORMAL LOW (ref 101–111)
Creatinine, Ser: 0.8 mg/dL (ref 0.61–1.24)
Glucose, Bld: 206 mg/dL — ABNORMAL HIGH (ref 65–99)
HCT: 53 % — ABNORMAL HIGH (ref 39.0–52.0)
Hemoglobin: 18 g/dL — ABNORMAL HIGH (ref 13.0–17.0)
Potassium: 3.6 mmol/L (ref 3.5–5.1)
Sodium: 135 mmol/L (ref 135–145)
TCO2: 21 mmol/L — ABNORMAL LOW (ref 22–32)

## 2018-01-05 LAB — CBC
HCT: 40.8 % (ref 39.0–52.0)
Hemoglobin: 14 g/dL (ref 13.0–17.0)
MCH: 27.2 pg (ref 26.0–34.0)
MCHC: 34.3 g/dL (ref 30.0–36.0)
MCV: 79.4 fL (ref 78.0–100.0)
Platelets: 134 10*3/uL — ABNORMAL LOW (ref 150–400)
RBC: 5.14 MIL/uL (ref 4.22–5.81)
RDW: 13.8 % (ref 11.5–15.5)
WBC: 8.7 10*3/uL (ref 4.0–10.5)

## 2018-01-05 LAB — LACTIC ACID, PLASMA
Lactic Acid, Venous: 2.4 mmol/L (ref 0.5–1.9)
Lactic Acid, Venous: 1.4 mmol/L (ref 0.5–1.9)

## 2018-01-05 LAB — HEMOGLOBIN A1C
Hgb A1c MFr Bld: 11.2 % — ABNORMAL HIGH (ref 4.8–5.6)
Mean Plasma Glucose: 274.74 mg/dL

## 2018-01-05 LAB — GLUCOSE, CAPILLARY: Glucose-Capillary: 171 mg/dL — ABNORMAL HIGH (ref 65–99)

## 2018-01-05 MED ORDER — INSULIN ASPART 100 UNIT/ML ~~LOC~~ SOLN
10.0000 [IU] | Freq: Once | SUBCUTANEOUS | Status: AC
Start: 2018-01-05 — End: 2018-01-05
  Administered 2018-01-05: 10 [IU] via INTRAVENOUS
  Filled 2018-01-05: qty 1

## 2018-01-05 MED ORDER — LABETALOL HCL 200 MG PO TABS
200.0000 mg | ORAL_TABLET | Freq: Two times a day (BID) | ORAL | Status: DC
  Administered 2018-01-05 – 2018-01-07 (×5): 200 mg via ORAL
  Filled 2018-01-05 (×5): qty 1

## 2018-01-05 MED ORDER — METOCLOPRAMIDE HCL 10 MG PO TABS
5.0000 mg | ORAL_TABLET | Freq: Three times a day (TID) | ORAL | Status: DC
  Administered 2018-01-05 (×3): 5 mg via ORAL
  Filled 2018-01-05 (×4): qty 1

## 2018-01-05 MED ORDER — PANTOPRAZOLE SODIUM 40 MG PO TBEC
40.0000 mg | DELAYED_RELEASE_TABLET | Freq: Every day | ORAL | Status: DC
  Administered 2018-01-05 – 2018-01-07 (×3): 40 mg via ORAL
  Filled 2018-01-05 (×3): qty 1

## 2018-01-05 MED ORDER — ACETAMINOPHEN 650 MG RE SUPP: 650.0000 mg | Freq: Four times a day (QID) | RECTAL | Status: DC | PRN

## 2018-01-05 MED ORDER — ONDANSETRON HCL 4 MG/2ML IJ SOLN
4.0000 mg | Freq: Four times a day (QID) | INTRAMUSCULAR | Status: DC | PRN
  Administered 2018-01-05: 4 mg via INTRAVENOUS
  Filled 2018-01-05: qty 2

## 2018-01-05 MED ORDER — SODIUM CHLORIDE 0.9 % IV BOLUS
1000.0000 mL | Freq: Once | INTRAVENOUS | Status: AC
  Administered 2018-01-05: 1000 mL via INTRAVENOUS

## 2018-01-05 MED ORDER — ASPIRIN EC 81 MG PO TBEC
81.0000 mg | DELAYED_RELEASE_TABLET | Freq: Every day | ORAL | Status: DC
  Administered 2018-01-05 – 2018-01-07 (×3): 81 mg via ORAL
  Filled 2018-01-05 (×3): qty 1

## 2018-01-05 MED ORDER — HYDRALAZINE HCL 20 MG/ML IJ SOLN
10.0000 mg | INTRAMUSCULAR | Status: DC | PRN
  Administered 2018-01-05: 10 mg via INTRAVENOUS
  Filled 2018-01-05: qty 1

## 2018-01-05 MED ORDER — POTASSIUM CHLORIDE CRYS ER 20 MEQ PO TBCR
40.0000 meq | EXTENDED_RELEASE_TABLET | Freq: Once | ORAL | Status: AC
  Administered 2018-01-05: 40 meq via ORAL
  Filled 2018-01-05: qty 2

## 2018-01-05 MED ORDER — AMLODIPINE BESYLATE 10 MG PO TABS
10.0000 mg | ORAL_TABLET | Freq: Every day | ORAL | Status: DC
  Administered 2018-01-05 – 2018-01-07 (×3): 10 mg via ORAL
  Filled 2018-01-05 (×3): qty 1

## 2018-01-05 MED ORDER — LABETALOL HCL 5 MG/ML IV SOLN
10.0000 mg | INTRAVENOUS | Status: DC | PRN
  Administered 2018-01-05: 10 mg via INTRAVENOUS
  Filled 2018-01-05 (×2): qty 4

## 2018-01-05 MED ORDER — INSULIN DETEMIR 100 UNIT/ML ~~LOC~~ SOLN
10.0000 [IU] | Freq: Two times a day (BID) | SUBCUTANEOUS | Status: DC
  Administered 2018-01-05 – 2018-01-07 (×5): 10 [IU] via SUBCUTANEOUS
  Filled 2018-01-05 (×8): qty 0.1

## 2018-01-05 MED ORDER — ENOXAPARIN SODIUM 40 MG/0.4ML ~~LOC~~ SOLN
40.0000 mg | SUBCUTANEOUS | Status: DC
  Administered 2018-01-05 – 2018-01-06 (×2): 40 mg via SUBCUTANEOUS
  Filled 2018-01-05 (×2): qty 0.4

## 2018-01-05 MED ORDER — MYCOPHENOLATE MOFETIL 250 MG PO CAPS
1000.0000 mg | ORAL_CAPSULE | Freq: Two times a day (BID) | ORAL | Status: DC
  Administered 2018-01-05 – 2018-01-07 (×6): 1000 mg via ORAL
  Filled 2018-01-05 (×8): qty 4

## 2018-01-05 MED ORDER — LISINOPRIL 5 MG PO TABS
5.0000 mg | ORAL_TABLET | Freq: Every day | ORAL | Status: DC
  Administered 2018-01-05: 5 mg via ORAL
  Filled 2018-01-05: qty 1

## 2018-01-05 MED ORDER — TACROLIMUS 1 MG PO CAPS
5.0000 mg | ORAL_CAPSULE | Freq: Two times a day (BID) | ORAL | Status: DC
  Administered 2018-01-05 – 2018-01-07 (×6): 5 mg via ORAL
  Filled 2018-01-05 (×8): qty 5

## 2018-01-05 MED ORDER — INSULIN ASPART 100 UNIT/ML ~~LOC~~ SOLN
0.0000 [IU] | Freq: Three times a day (TID) | SUBCUTANEOUS | Status: DC
  Administered 2018-01-05 – 2018-01-06 (×4): 3 [IU] via SUBCUTANEOUS
  Administered 2018-01-06: 1 [IU] via SUBCUTANEOUS
  Administered 2018-01-06 – 2018-01-07 (×2): 2 [IU] via SUBCUTANEOUS
  Filled 2018-01-05 (×3): qty 1

## 2018-01-05 MED ORDER — ATORVASTATIN CALCIUM 80 MG PO TABS
80.0000 mg | ORAL_TABLET | Freq: Every day | ORAL | Status: DC
  Administered 2018-01-05 – 2018-01-06 (×2): 80 mg via ORAL
  Filled 2018-01-05 (×3): qty 1

## 2018-01-05 MED ORDER — ONDANSETRON HCL 4 MG PO TABS
4.0000 mg | ORAL_TABLET | Freq: Four times a day (QID) | ORAL | Status: DC | PRN
  Administered 2018-01-06: 4 mg via ORAL
  Filled 2018-01-05: qty 1

## 2018-01-05 MED ORDER — SODIUM CHLORIDE 0.9 % IV SOLN
INTRAVENOUS | Status: DC
  Administered 2018-01-05: 04:00:00 via INTRAVENOUS

## 2018-01-05 MED ORDER — ACETAMINOPHEN 325 MG PO TABS: 650.0000 mg | ORAL_TABLET | Freq: Four times a day (QID) | ORAL | Status: DC | PRN

## 2018-01-05 NOTE — ED Notes (Signed)
Pharmacy communication via telephone to send 1000 medications.

## 2018-01-05 NOTE — Progress Notes (Signed)
Patient is a 67 year old male with past medical history of diabetes type 1, status post renal and pancreatic transplant   who presented to the emergency room with complaints of nausea vomiting for last 3 days.  Patient was unable to eat anything by mouth.  Denied any abdominal pain.   On presentation to the emergency department, patient was noted to be febrile, tachycardic and hypertensive. Patient was also found to be in mild DKA on presentation which improved with one-time IV insulin and IV fluids. Patient seen and examined at bedside this morning.  Currently he feels better.  His nausea and vomiting have improved. He  is still hypertensive.  Patient is on as needed labetalol.  We have also started his home antihypertensives. Patient was also found to have elevated lactic acid level on presentation which has resolved with IV fluids. We will continue to monitor the patient. Patient was seen by Dr. Katrinka BlazingSmith this morning.

## 2018-01-05 NOTE — ED Notes (Signed)
MEAL TRAY AT BEDSIDE

## 2018-01-05 NOTE — H&P (Addendum)
History and Physical    [name] WUJ:[id number] DOB: 28-Feb-1951 DOA: 01/04/2018  Referring MD/NP/PA: Dr. Ross Marcusourtney Horton PCP: Patient, No Pcp Per  Patient coming from: Home  Chief Complaint: Nausea and vomiting  I [facility] personally briefly [facility]   HPI: [name] is a 67 y.o. male with medical history significant of diabetes mellitus type 1 status post renal and pancreatic transplant; who presents with complaints of nausea and vomiting for last 3 days.  Patient reports being unable to keep any significant amount of fluid liquids down.  Emesis is nonbloody in appearance.  Patient reports having no antiemetics to used to treat symptoms.  Due to symptoms he had not been taking his insulin as prescribed.  Denies having any significant abdominal pain, fever, chills, dysuria, or diarrhea symptoms.  Patient had a similar symptoms when admitted back in 09/2017.  ED Course: Upon admission to the emergency department patient was noted be afebrile, pulse 106-125, respirations 11-18, blood pressure 131/70-176/80, and O2 saturation maintained on room air.  Labs revealed WBC 14.9, hemoglobin 18, sodium 128, chloride 90, CO2 20, BUN 24, creatinine 1.09, glucose 321, anion gap 18.  Venous pH was noted to be 7.3.  Urinalysis had not been obtained.  Patient was given 2 L normal saline IV fluids along with 10 units of NovoLog and Zofran.  Repeat BMP noted anion gap of 15.  TRH called to admit.  Review of Systems  Constitutional: Positive for malaise/fatigue. Negative for chills and fever.  HENT: Negative for ear discharge and nosebleeds.   Eyes: Negative for photophobia and discharge.  Respiratory: Negative for cough and shortness of breath.   Cardiovascular: Negative for chest pain and leg swelling.  Gastrointestinal: Positive for nausea and vomiting. Negative for abdominal pain and diarrhea.  Genitourinary: Negative for dysuria and flank pain.    Musculoskeletal: Negative for falls and joint pain.  Skin: Negative for itching and rash.  Neurological: Negative for speech change and seizures.  Psychiatric/Behavioral: Negative for memory loss and substance abuse.    Past Medical History:  Diagnosis Date  . Anxiety   . CPAP (continuous positive airway pressure) dependence   . Diabetes mellitus without complication (HCC)   . GERD (gastroesophageal reflux disease)   . Hypertension   . Legally blind in left eye, as [place] in [place]   . PTSD (post-traumatic stress disorder)   . PVD (peripheral vascular disease) (HCC) 01/17/2015  . Renal disorder   . Sleep apnea   . Thyroid disease     Past Surgical History:  Procedure Laterality Date  . ABDOMINAL AORTAGRAM N/A 01/21/2015   Procedure: ABDOMINAL Ronny FlurryAORTAGRAM;  Surgeon: Chuck Hinthristopher S Dickson, MD;  [facility];  Service: Cardiovascular;  Laterality: N/A;  . AMPUTATION Left 01/23/2015   Procedure: FIRST RAY AMPUTATION/LEFT FOOT;  Surgeon: Nadara MustardMarcus Duda V, MD;  Location: [facility] OR;  Service: Orthopedics;  Laterality: Left;  . AMPUTATION Left 02/20/2015   Procedure: AMPUTATION BELOW KNEE;  Surgeon: Nadara MustardMarcus Duda V, MD;  Location: [facility] OR;  Service: Orthopedics;  Laterality: Left;  . AV FISTULA PLACEMENT    . COLONOSCOPY    . COMBINED KIDNEY-PANCREAS TRANSPLANT Left 2009  . INCISION AND DRAINAGE ABSCESS Left 01/13/2015   Procedure: INCISION AND DRAINAGE OF LEFT FOOT FIRST RAY ABCESS;  Surgeon: Kathryne Hitchhristopher Y Blackman, MD;  Location: [facility] OR;  Service: Orthopedics;  Laterality: Left;  . LOWER EXTREMITY ANGIOGRAM Left 01/21/2015   Procedure: LOWER EXTREMITY ANGIOGRAM;  Surgeon: Cristal Deerhristopher  Adele Dan, MD;  Location: [facility] CATH LAB;  Service: Cardiovascular;  Laterality: Left;     reports that he has quit smoking. He has never used smokeless tobacco. He reports that he drinks alcohol. He reports that he does not use drugs.  No Known Allergies  Family History  Problem Relation Age of Onset  .  Hypertension Other     Prior to Admission medications   Medication Sig Start Date End Date Taking? Authorizing Provider  amLODipine (NORVASC) 10 MG tablet Take 10 mg by mouth daily.   Yes [provider]  aspirin EC 81 MG tablet Take 81 mg by mouth daily.   Yes [provider]  atorvastatin (LIPITOR) 80 MG tablet Take 80 mg by mouth at bedtime.   Yes [provider]  insulin detemir (LEVEMIR) 100 UNIT/ML injection Inject 0.15 mLs (15 Units total) into the skin 2 (two) times daily. 07/28/17  Yes Osvaldo Shipper, MD  mycophenolate (CELLCEPT) 250 MG capsule Take 1,000 mg by mouth 2 (two) times daily.   Yes [provider]  pantoprazole (PROTONIX) 40 MG tablet Take 1 tablet (40 mg total) by mouth daily. 07/28/17  Yes Osvaldo Shipper, MD  tacrolimus (PROGRAF) 5 MG capsule Take 5 mg by mouth 2 (two) times daily.   Yes [provider]  furosemide (LASIX) 20 MG tablet Take 1 tablet (20 mg total) by mouth daily. Patient not taking: Reported on 10/18/2017 07/28/17   Osvaldo Shipper, MD  labetalol (NORMODYNE) 100 MG tablet Take 2 tablets (200 mg total) by mouth 2 (two) times daily. Patient not taking: Reported on 01/05/2018 07/28/17   Osvaldo Shipper, MD  lisinopril (PRINIVIL,ZESTRIL) 5 MG tablet Take 1 tablet (5 mg total) by mouth daily. Patient not taking: Reported on 01/05/2018 07/29/17   Osvaldo Shipper, MD  metoCLOPramide (REGLAN) 5 MG tablet Take 1 tablet (5 mg total) by mouth 3 (three) times daily for 3 days. Patient not taking: Reported on 01/05/2018 10/20/17 01/05/26  Leatha Gilding, MD    Physical Exam:  Constitutional: Elderly male who appears to be in some discomfort Vitals:   01/04/18 2313 01/04/18 2337 01/05/18 0115 01/05/18 0200  BP: (!) 173/93  131/70 (!) 158/81  Pulse: (!) 125  (!) 112 (!) 107  Resp: 14  11 14   Temp:  99 F (37.2 C)    TempSrc:  Rectal    SpO2: 100%  98% 98%  Weight:      Height:       Eyes: PERRL, lids and  conjunctivae normal ENMT: Mucous membranes are dry. Posterior pharynx clear of any exudate or lesions.   Neck: normal, supple, no masses, no thyromegaly Respiratory: clear to auscultation bilaterally, no wheezing, no crackles. Normal respiratory effort. No accessory muscle use.  Cardiovascular: Tachycardic, no murmurs / rubs / gallops. No extremity edema. 2+ pedal pulses. No carotid bruits.  Abdomen: no tenderness, no masses palpated. No hepatosplenomegaly. Bowel sounds positive.  Musculoskeletal: no clubbing / cyanosis.  Below knee amputation on the left. Skin: no rashes, lesions, ulcers. No induration Neurologic: CN 2-12 grossly intact. Sensation intact, DTR normal. Strength 5/5 in all 4.  Psychiatric: Normal judgment and insight. Alert and oriented x 3. Normal mood.     Labs on Admission: I have personally reviewed following labs and imaging studies  CBC: Recent Labs  Lab 01/04/18 2149 01/05/18 0155  WBC 14.9*  --   HGB 17.3* 18.0*  HCT 50.7 53.0*  MCV 79.1  --   PLT 186  --  Basic Metabolic Panel: Recent Labs  Lab 01/04/18 2149 01/05/18 0155  NA 128* 135  K 4.0 3.6  CL 90* 99*  CO2 20*  --   GLUCOSE 321* 206*  BUN 24* 27*  CREATININE 1.09 0.80  CALCIUM 10.1  --    GFR: Estimated Creatinine Clearance: 96.3 mL/min (by C-G formula based on SCr of 0.8 mg/dL). Liver Function Tests: Recent Labs  Lab 01/04/18 2149  AST 13*  ALT 13*  ALKPHOS 123  BILITOT 1.7*  PROT 8.0  ALBUMIN 4.8   Recent Labs  Lab 01/04/18 2149  LIPASE 26   No results for input(s): AMMONIA in the last 168 hours. Coagulation Profile: No results for input(s): INR, PROTIME in the last 168 hours. Cardiac Enzymes: No results for input(s): CKTOTAL, CKMB, CKMBINDEX, TROPONINI in the last 168 hours. BNP (last 3 results) No results for input(s): PROBNP in the last 8760 hours. HbA1C: No results for input(s): HGBA1C in the last 72 hours. CBG: Recent Labs  Lab 01/04/18 2131 01/05/18 0027  01/05/18 0126  GLUCAP 361* 316* 211*   Lipid Profile: No results for input(s): CHOL, HDL, LDLCALC, TRIG, CHOLHDL, LDLDIRECT in the last 72 hours. Thyroid Function Tests: No results for input(s): TSH, T4TOTAL, FREET4, T3FREE, THYROIDAB in the last 72 hours. Anemia Panel: No results for input(s): VITAMINB12, FOLATE, FERRITIN, TIBC, IRON, RETICCTPCT in the last 72 hours. Urine analysis:    Component Value Date/Time   COLORURINE STRAW (A) 10/18/2017 1825   APPEARANCEUR CLEAR 10/18/2017 1825   LABSPEC 1.023 10/18/2017 1825   PHURINE 5.0 10/18/2017 1825   GLUCOSEU >=500 (A) 10/18/2017 1825   HGBUR SMALL (A) 10/18/2017 1825   BILIRUBINUR NEGATIVE 10/18/2017 1825   KETONESUR 80 (A) 10/18/2017 1825   PROTEINUR 100 (A) 10/18/2017 1825   UROBILINOGEN 1.0 09/29/2013 2339   NITRITE NEGATIVE 10/18/2017 1825   LEUKOCYTESUR NEGATIVE 10/18/2017 1825   Sepsis Labs: No results found for this or any previous visit (from the past 240 hour(s)).   Radiological Exams on Admission: Dg Abdomen Acute W/chest  Result Date: 01/05/2018 CLINICAL DATA:  Nausea and vomiting x2 days. EXAM: DG ABDOMEN ACUTE W/ 1V CHEST COMPARISON:  07/24/2017 CXR and abdomen radiographs FINDINGS: Stable cardiomegaly. No aortic aneurysm. Clear lungs. No free air beneath the diaphragm. Unremarkable bowel gas pattern. Mild lower lumbar facet arthropathy at L4-5 and L5-S1. No radiopaque calculi. Cholecystectomy clips in the right upper quadrant. IMPRESSION: Negative abdominal radiographs.  No acute cardiopulmonary disease. Electronically Signed   By: Tollie Eth M.D.   On: 01/05/2018 00:31    EKG: Independently reviewed.  Sinus tachycardia at 108 bpm QTC 464  Assessment/Plan Intractable nausea and vomiting: Acute.  Patient presents with persistent nausea and vomiting.  Abdominal x-ray shows no signs of obstruction. - Admit to a telemetry bed - Antiemetics as needed - Restart Reglan 5 mg 3 times daily  - Advance diet as  tolerated  Diabetic ketoacidosis,  type 1: Resolving. Patient's initial presentation blood glucose of 321 with anion gap 18 and CO2 20.  Venous pH however was noted to be within normal limits at 7.3.  No urinalysis was obtained.  Patient may have been in mild DKA that improved with IV fluid hydration and 10 units of insulin.  Last known hemoglobin A1c noted to be 10.4 on 06/2017 - Hypoglycemic protocol - Normal saline at 100 mL/h - Change Levemir to 10 units twice daily due to decreased p.o. intake - CBGs q. before meals with sensitive SSI  Leukocytosis, lactic  acidosis: Acute.  WBC elevated at 14.9 with initial lactic acid 2.64.  Chest x-ray otherwise noted to be clear.  Urinalysis pending.  Lactic acidosis likely related with dehydration.  - Follow-up urinalysis - Trend lactic acid levels - Repeat CBC in a.m.  Hyponatremia: Acute.  Initial sodium noted to be 128 suspect secondary to hypovolemia and/or hyperglycemia.  Sodium noted to be within normal limits after 2 L of normal saline IV fluids. - Continue to monitor  Essential hypertension - Continue amlodipine  S/p pancreatic and renal transplant - Continue CellCept and Prograf  S/p left BKA  DVT prophylaxis: Lovenox Code Status: Full Family Communication: no family present at bedside Disposition Plan: Likely discharge home once medically stable Consults called: None Admission status: Observation  Clydie Braun MD [facility] Pager [phone]   If 7PM-7AM, please contact night-coverage [email or web address] Password [email or web address]  01/05/2018, 2:35 AM

## 2018-01-05 NOTE — Progress Notes (Signed)
Inpatient Diabetes Program Recommendations  AACE/ADA: New Consensus Statement on Inpatient Glycemic Control (2015)  Target Ranges:  Prepandial:   less than 140 mg/dL      Peak postprandial:   less than 180 mg/dL (1-2 hours)      Critically ill patients:  140 - 180 mg/dL   Lab Results  Component Value Date   GLUCAP 204 (H) 01/05/2018   HGBA1C 11.2 (H) 01/05/2018     Attempted to see patient regarding A1C and discussion of outpatient diabetes management. Patient was sleeping and feeing poorly. Therefore, discussion was inappropriate, will attempt at a later time.   Thanks, Lujean RaveLauren Lason Eveland, MSN, RNC-OB Diabetes Coordinator [phone] (8a-5p)

## 2018-01-05 NOTE — ED Provider Notes (Addendum)
[facility] EMERGENCY DEPARTMENT Provider Note   CSN: [id number] Arrival date & time: 01/04/18  2123     History   Chief Complaint Chief Complaint  Patient presents with  . Emesis    HPI [name] is a 67 y.o. male.  HPI  This is a 67 year old male with history of diabetes, hypertension, thyroid disease who presents with nausea and vomiting.  Patient reports 3-day history of nausea, vomiting, and "not feeling well."  He denies any abdominal pain or diarrhea.  He denies any chest pain, shortness of breath.  Patient has not taken his insulin since Saturday because he has not been eating.  He has tried to stay hydrated.  He denies any recent fevers or upper respiratory symptoms.  Past Medical History:  Diagnosis Date  . Anxiety   . CPAP (continuous positive airway pressure) dependence   . Diabetes mellitus without complication (HCC)   . GERD (gastroesophageal reflux disease)   . Hypertension   . Legally blind in left eye, as defined in [place]   . PTSD (post-traumatic stress disorder)   . PVD (peripheral vascular disease) (HCC) 01/17/2015  . Renal disorder   . Sleep apnea   . Thyroid disease     Patient Active Problem List   Diagnosis Date Noted  . Nausea and vomiting 10/19/2017  . Nausea & vomiting 10/18/2017  . Hematemesis 07/24/2017  . Hypertensive urgency 07/24/2017  . Acute GI bleeding 07/24/2017  . 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 S/P BKA (below knee amputation) unilateral (HCC) 02/20/2015  . Ischemic ulcer of toe (HCC)   . Peripheral vascular disease (HCC)   . PVD (peripheral vascular disease) (HCC) 01/17/2015  . Cellulitis and abscess of foot 01/13/2015  . Cellulitis and abscess of foot excluding toe 01/13/2015  . Diabetes mellitus without complication (HCC) 01/13/2015  . Leukocytosis 01/13/2015  . Diabetes mellitus with peripheral vascular disease (HCC) 12/19/2007  . DYSLIPIDEMIA 12/19/2007  .  ANEMIA 12/19/2007  . ANXIETY 12/19/2007  . Essential hypertension 12/19/2007  . TRANSIENT ISCHEMIC ATTACK 12/19/2007  . PERIPHERAL VASCULAR DISEASE 12/19/2007  . RENAL FAILURE 12/19/2007    Past Surgical History:  Procedure Laterality Date  . ABDOMINAL AORTAGRAM N/A 01/21/2015   Procedure: ABDOMINAL Ronny Flurry;  Surgeon: Chuck Hint, MD;  Location: [facility] CATH LAB;  Service: Cardiovascular;  Laterality: N/A;  . AMPUTATION Left 01/23/2015   Procedure: FIRST RAY AMPUTATION/LEFT FOOT;  Surgeon: Nadara Mustard, MD;  Location: [facility] OR;  Service: Orthopedics;  Laterality: Left;  . AMPUTATION Left 02/20/2015   Procedure: AMPUTATION BELOW KNEE;  Surgeon: Nadara Mustard, MD;  Location: [facility] OR;  Service: Orthopedics;  Laterality: Left;  . AV FISTULA PLACEMENT    . COLONOSCOPY    . COMBINED KIDNEY-PANCREAS TRANSPLANT Left 2009  . INCISION AND DRAINAGE ABSCESS Left 01/13/2015   Procedure: INCISION AND DRAINAGE OF LEFT FOOT FIRST RAY ABCESS;  Surgeon: Kathryne Hitch, MD;  Location: [facility] OR;  Service: Orthopedics;  Laterality: Left;  . LOWER EXTREMITY ANGIOGRAM Left 01/21/2015   Procedure: LOWER EXTREMITY ANGIOGRAM;  Surgeon: Chuck Hint, MD;  Location: [facility] CATH LAB;  Service: Cardiovascular;  Laterality: Left;        Home Medications    Prior to Admission medications   Medication Sig Start Date End Date Taking? Authorizing Provider  amLODipine (NORVASC) 10 MG tablet Take 10 mg by mouth daily.   Yes [provider]  aspirin EC 81 MG tablet  Take 81 mg by mouth daily.   Yes [provider]  atorvastatin (LIPITOR) 80 MG tablet Take 80 mg by mouth at bedtime.   Yes [provider]  insulin detemir (LEVEMIR) 100 UNIT/ML injection Inject 0.15 mLs (15 Units total) into the skin 2 (two) times daily. 07/28/17  Yes Osvaldo Shipper, MD  mycophenolate (CELLCEPT) 250 MG capsule Take 1,000 mg by mouth 2 (two) times daily.   Yes [provider]  pantoprazole  (PROTONIX) 40 MG tablet Take 1 tablet (40 mg total) by mouth daily. 07/28/17  Yes Osvaldo Shipper, MD  tacrolimus (PROGRAF) 5 MG capsule Take 5 mg by mouth 2 (two) times daily.   Yes [provider]  furosemide (LASIX) 20 MG tablet Take 1 tablet (20 mg total) by mouth daily. Patient not taking: Reported on 10/18/2017 07/28/17   Osvaldo Shipper, MD  labetalol (NORMODYNE) 100 MG tablet Take 2 tablets (200 mg total) by mouth 2 (two) times daily. Patient not taking: Reported on 01/05/2018 07/28/17   Osvaldo Shipper, MD  lisinopril (PRINIVIL,ZESTRIL) 5 MG tablet Take 1 tablet (5 mg total) by mouth daily. Patient not taking: Reported on 01/05/2018 07/29/17   Osvaldo Shipper, MD  metoCLOPramide (REGLAN) 5 MG tablet Take 1 tablet (5 mg total) by mouth 3 (three) times daily for 3 days. Patient not taking: Reported on 01/05/2018 10/20/17 01/05/26  Leatha Gilding, MD    Family History Family History  Problem Relation Age of Onset  . Hypertension Other     Social History Social History   Tobacco Use  . Smoking status: Former Games developer  . Smokeless tobacco: Never Used  . Tobacco comment: quit i [id number]  Substance Use Topics  . Alcohol use: Yes    Comment: 1 beer rarely   . Drug use: No     Allergies   Patient has no known allergies.   Review of Systems Review of Systems  Constitutional: Negative for fever.  Respiratory: Negative for shortness of breath.   Cardiovascular: Negative for chest pain.  Gastrointestinal: Positive for nausea and vomiting. Negative for abdominal pain and diarrhea.  Genitourinary: Negative for dysuria.  Skin: Negative for rash.  Neurological: Negative for dizziness.  All other systems reviewed and are negative.    Physical Exam Updated Vital Signs BP (!) 158/81   Pulse (!) 107   Temp 99 F (37.2 C) (Rectal)   Resp 14   Ht 5\' 10"  (1.778 m)   Wt 79.4 kg (175 lb)   SpO2 98%   BMI 25.11 kg/m   Physical Exam  Constitutional: He is oriented to  person, place, and time. No distress.  HENT:  Head: Normocephalic and atraumatic.  Mucous membranes dry  Cardiovascular: Regular rhythm and normal heart sounds.  No murmur heard. Tachycardia  Pulmonary/Chest: Effort normal and breath sounds normal. No respiratory distress. He has no wheezes.  Abdominal: Soft. Bowel sounds are normal. There is no tenderness. There is no rebound.  Extensive abdominal scarring, nontender  Musculoskeletal: He exhibits no edema.  Neurological: He is alert and oriented to person, place, and time.  Skin: Skin is warm and dry.  Psychiatric: He has a normal mood and affect.  Nursing note and vitals reviewed.    ED Treatments / Results  Labs (all labs ordered are listed, but only abnormal results are displayed) Labs Reviewed  COMPREHENSIVE METABOLIC PANEL - Abnormal; Notable for the following components:      Result Value   Sodium 128 (*)    Chloride  90 (*)    CO2 20 (*)    Glucose, Bld 321 (*)    BUN 24 (*)    AST 13 (*)    ALT 13 (*)    Total Bilirubin 1.7 (*)    Anion gap 18 (*)    All other components within normal limits  CBC - Abnormal; Notable for the following components:   WBC 14.9 (*)    RBC 6.41 (*)    Hemoglobin 17.3 (*)    All other components within normal limits  CBG MONITORING, ED - Abnormal; Notable for the following components:   Glucose-Capillary 361 (*)    All other components within normal limits  I-STAT CG4 LACTIC ACID, ED - Abnormal; Notable for the following components:   Lactic Acid, Venous 2.64 (*)    All other components within normal limits  I-STAT CG4 LACTIC ACID, ED - Abnormal; Notable for the following components:   Lactic Acid, Venous 2.63 (*)    All other components within normal limits  CBG MONITORING, ED - Abnormal; Notable for the following components:   Glucose-Capillary 316 (*)    All other components within normal limits  I-STAT VENOUS BLOOD GAS, ED - Abnormal; Notable for the following components:    pCO2, Ven 41.3 (*)    Acid-base deficit 6.0 (*)    All other components within normal limits  CBG MONITORING, ED - Abnormal; Notable for the following components:   Glucose-Capillary 211 (*)    All other components within normal limits  I-STAT CHEM 8, ED - Abnormal; Notable for the following components:   Chloride 99 (*)    BUN 27 (*)    Glucose, Bld 206 (*)    Calcium, Ion 1.09 (*)    TCO2 21 (*)    Hemoglobin 18.0 (*)    HCT 53.0 (*)    All other components within normal limits  LIPASE, BLOOD  URINALYSIS, ROUTINE W REFLEX MICROSCOPIC  I-STAT TROPONIN, ED    EKG EKG Interpretation  Date/Time:  Wednesday January 05 2018 00:30:12 EDT Ventricular Rate:  108 PR Interval:    QRS Duration: 86 QT Interval:  349 QTC Calculation: 468 R Axis:   89 Text Interpretation:  Sinus tachycardia Borderline right axis deviation Anteroseptal infarct, old similar to prior Confirmed by Ross MarcusHorton, Courtney ([id number]) on 01/05/2018 1:36:39 AM   Radiology Dg Abdomen Acute W/chest  Result Date: 01/05/2018 CLINICAL DATA:  Nausea and vomiting x2 days. EXAM: DG ABDOMEN ACUTE W/ 1V CHEST COMPARISON:  07/24/2017 CXR and abdomen radiographs FINDINGS: Stable cardiomegaly. No aortic aneurysm. Clear lungs. No free air beneath the diaphragm. Unremarkable bowel gas pattern. Mild lower lumbar facet arthropathy at L4-5 and L5-S1. No radiopaque calculi. Cholecystectomy clips in the right upper quadrant. IMPRESSION: Negative abdominal radiographs.  No acute cardiopulmonary disease. Electronically Signed   By: Tollie Ethavid  Kwon M.D.   On: 01/05/2018 00:31    Procedures Procedures (including critical care time)  CRITICAL CARE Performed by: Shon BatonHORTON, COURTNEY F   Total critical care time: 30 minutes  Critical care time was exclusive of separately billable procedures and treating other patients.  Critical care was necessary to treat or prevent imminent or life-threatening deterioration.  Critical care was time spent  personally by me on the following activities: development of treatment plan with patient and/or surrogate as well as nursing, discussions with consultants, evaluation of patient's response to treatment, examination of patient, obtaining history from patient or surrogate, ordering and performing treatments and interventions, ordering and review of laboratory  studies, ordering and review of radiographic studies, pulse oximetry and re-evaluation of patient's condition.   Medications Ordered in ED Medications  ondansetron (ZOFRAN-ODT) disintegrating tablet 4 mg (4 mg Oral Given 01/04/18 2150)  sodium chloride 0.9 % bolus 1,000 mL (0 mLs Intravenous Stopped 01/05/18 0000)  ondansetron (ZOFRAN) injection 4 mg (4 mg Intravenous Given 01/05/18 0025)  sodium chloride 0.9 % bolus 1,000 mL (0 mLs Intravenous Stopped 01/05/18 0059)  insulin aspart (novoLOG) injection 10 Units (10 Units Intravenous Given 01/05/18 0028)     Initial Impression / Assessment and Plan / ED Course  I have reviewed the triage vital signs and the nursing notes.  Pertinent labs & imaging results that were available during my care of the patient were reviewed by me and considered in my medical decision making (see chart for details).     Presents with isolated vomiting.  He is dry on exam.  Tachycardic but not hypotensive and afebrile.  No other signs or symptoms of sepsis.  He denies abdominal pain and has no significant abdominal tenderness with benign abdominal exam; however, he is a set up for a bowel obstruction given his prior abdominal history.  Lab workup noted for hyponatremia and CBC appears concentrated suggestive of dehydration.  He does have a slight lactic acidosis and an anion gap of 18.  Blood sugars 317.  VBG does not show significant acidosis.  This could reflect mild DKA versus dehydration and slight lactic acidosis.  Patient was given 2 L of fluid and 10 units of insulin.  Repeat lactate 2.25.  Repeat chemistry improving  with improvement of sodium, glucose.  Anion gap now 15.  Acute abdominal series shows no evidence of air-fluid levels to suggest SBO.  Patient states he feels somewhat better after fluids.  He is persistently tachycardic at 110.  Given this, his immunosuppression, and possible mild DKA, will admit for further hydration.  This was discussed with the hospitalist.  At this time will hold off on starting glucose stabilizer giving improving trend in glucose and CMP.  Final Clinical Impressions(s) / ED Diagnoses   Final diagnoses:  Dehydration  Hyponatremia  Non-intractable vomiting with nausea, unspecified vomiting type    ED Discharge Orders    None       Horton, Mayer Masker, MD 01/05/18 1610    Shon Baton, MD 01/05/18 [phone]

## 2018-01-06 ENCOUNTER — Other Ambulatory Visit: Payer: Self-pay

## 2018-01-06 DIAGNOSIS — I10 Essential (primary) hypertension: Secondary | ICD-10-CM

## 2018-01-06 DIAGNOSIS — R112 Nausea with vomiting, unspecified: Secondary | ICD-10-CM

## 2018-01-06 DIAGNOSIS — E1010 Type 1 diabetes mellitus with ketoacidosis without coma: Principal | ICD-10-CM

## 2018-01-06 LAB — BASIC METABOLIC PANEL
Anion gap: 14 (ref 5–15)
BUN: 19 mg/dL (ref 6–20)
CO2: 17 mmol/L — ABNORMAL LOW (ref 22–32)
Calcium: 9 mg/dL (ref 8.9–10.3)
Chloride: 101 mmol/L (ref 101–111)
Creatinine, Ser: 0.85 mg/dL (ref 0.61–1.24)
GFR calc Af Amer: >60 mL/min (ref >60)
GFR calc non Af Amer: >60 mL/min (ref >60)
Glucose, Bld: 165 mg/dL — ABNORMAL HIGH (ref 65–99)
Potassium: 3.5 mmol/L (ref 3.5–5.1)
Sodium: 132 mmol/L — ABNORMAL LOW (ref 135–145)

## 2018-01-06 LAB — GLUCOSE, CAPILLARY
Glucose-Capillary: 170 mg/dL — ABNORMAL HIGH (ref 65–99)
Glucose-Capillary: 147 mg/dL — ABNORMAL HIGH (ref 65–99)
Glucose-Capillary: 209 mg/dL — ABNORMAL HIGH (ref 65–99)
Glucose-Capillary: 155 mg/dL — ABNORMAL HIGH (ref 65–99)

## 2018-01-06 MED ORDER — METOCLOPRAMIDE HCL 10 MG PO TABS
10.0000 mg | ORAL_TABLET | Freq: Four times a day (QID) | ORAL | Status: DC | PRN
  Administered 2018-01-06: 10 mg via ORAL

## 2018-01-06 MED ORDER — PNEUMOCOCCAL VAC POLYVALENT 25 MCG/0.5ML IJ INJ
0.5000 mL | INJECTION | INTRAMUSCULAR | Status: AC
  Administered 2018-01-07: 0.5 mL via INTRAMUSCULAR
  Filled 2018-01-06: qty 0.5

## 2018-01-06 MED ORDER — LISINOPRIL 10 MG PO TABS
10.0000 mg | ORAL_TABLET | Freq: Every day | ORAL | Status: DC
  Administered 2018-01-06 – 2018-01-07 (×2): 10 mg via ORAL
  Filled 2018-01-06 (×2): qty 1

## 2018-01-06 NOTE — Progress Notes (Addendum)
Inpatient Diabetes Program Recommendations  AACE/ADA: New Consensus Statement on Inpatient Glycemic Control (2015)  Target Ranges:  Prepandial:   less than 140 mg/dL      Peak postprandial:   less than 180 mg/dL (1-2 hours)      Critically ill patients:  140 - 180 mg/dL   Lab Results  Component Value Date   GLUCAP 147 (H) 01/06/2018   HGBA1C 11.2 (H) 01/05/2018    Review of Glycemic Control Results for [name], [name] (MRN [id number]) as of 01/06/2018 14:02  Ref. Range 01/05/2018 17:46 01/05/2018 22:47 01/06/2018 08:13 01/06/2018 12:14  Glucose-Capillary Latest Ref Range: 65 - 99 mg/dL 409219 (H) 811171 (H) 914170 (H) 147 (H)   Diabetes history: Type 1 DM Outpatient Diabetes medications: Levemir 15 units BID Current orders for Inpatient glycemic control: Novolog 0-9 units TID, Levemir 10 units BID  Inpatient Diabetes Program Recommendations:    Spoke with patient regarding home diabetes management. Patient admits to missing doses of his Levemir because of errands. He uses vial and syringe. Patient states, " I have to do better. I plan when I get home to get ahold of my health." Seems appropriate to continue this home regimen at discharge and follow up with PCP or endo. Reviewed patient's current A1c of 11.2%. Explained what a A1c is and what it measures. Also reviewed goal A1c with patient, importance of good glucose control @ home, and blood sugar goals. Discussed long term co-morbidities associated with poor control, vascular changes that occur and patho of DM. Patient seemed surprised by A1C. He states that he checks BS a couple of times a day. Encouraged to begin writing down BS and presenting them to PCP or endocrinologist.   Will place CM consult to establish care with endocrinologist. [place] it has been a while. Patient is interested. Has no further questions regarding DM at this time.  Thanks, Lujean RaveLauren Maurina Fawaz, MSN, RNC-OB Diabetes  Coordinator [phone] (8a-5p)

## 2018-01-06 NOTE — Progress Notes (Signed)
Paged Adhikari regarding pt sustained heart rate of 130-140 with bigeminy.  Patient has some nausea and vomiting, with dizziness.  Vital signs taken, per doctor take 12 lead ekg.  Will await any new orders and results.

## 2018-01-06 NOTE — Consult Note (Signed)
   [facility] CM In[facility] Consult   01/06/2018  [name] July 22, 1951 [id number]  Patient was assessed for re-admissions and [facility] but current demographics and MD progress notes reveal that the patient has no primary care provider.  Went by to speak with patient was in consultation with someone else.  Will follow up at a later time, if appropriate. Spoke with inpatient RNCM, Marylene Landngela about no primary care provider being listed.  Charlesetta ShanksVictoria Neita Landrigan, RN BSN CCM [facility] Liaison  [phone] business mobile phone Toll free office [phone]

## 2018-01-06 NOTE — Evaluation (Signed)
Physical Therapy Evaluation Patient Details Name: [name] MRN: [id number] DOB: [birth year] Today's Date: 01/06/2018   History of Present Illness  Pt is a 67 y/o male admitted secondary to intractable n/v and diabetic ketoacidosis. PMH including but not limited to L transtibial amputation in 2016, DM type 1, HTN, L eye visual impairment, PVD and PTSD.    Clinical Impression  Pt presented supine in bed with HOB elevated, awake and willing to participate in therapy session. Prior to admission, pt reported that he ambulates with use of L LE prosthesis, SPC PRN and is independent with ADLs. Pt currently limited secondary to dizziness with movement. Of note, pt's BP in sitting was 146/63 and then dropped to 128/61 in standing. Pt able to perform bed mobility with supervision and transfers with min guard for safety. Pt able to sit EOB to don and doff L LE prosthesis with supervision. PT will continue to follow pt acutely to progress mobility as tolerated and to ensure a safe d/c home.    Follow Up Recommendations No PT follow up;Supervision for mobility/OOB    Equipment Recommendations  None recommended by PT    Recommendations for Other Services       Precautions / Restrictions Precautions Precautions: Fall Precaution Comments: has a L LE prosthesis Restrictions Weight Bearing Restrictions: No      Mobility  Bed Mobility Overal bed mobility: Needs Assistance Bed Mobility: Supine to Sit;Sit to Supine     Supine to sit: Supervision Sit to supine: Supervision   General bed mobility comments: supervision for safety  Transfers Overall transfer level: Needs assistance Equipment used: Straight cane Transfers: Sit to/from Stand Sit to Stand: Min guard         General transfer comment: good technique, min guard for safety; pt performed sit<>stand from EOB x2, +dizziness both attempts  Ambulation/Gait             General Gait Details: did not attempt this session  secondary to +dizziness that did not subside with time  Stairs            Wheelchair Mobility    Modified Rankin (Stroke Patients Only)       Balance Overall balance assessment: Needs assistance Sitting-balance support: Feet supported Sitting balance-Leahy Scale: Good Sitting balance - Comments: pt able to sit EOB and don L LE prosthesis with supervision   Standing balance support: During functional activity;Single extremity supported Standing balance-Leahy Scale: Poor Standing balance comment: reliant on SPC                             Pertinent Vitals/Pain Pain Assessment: No/denies pain    Home Living Family/patient expects to be discharged to:: Private residence Living Arrangements: Spouse/significant other Available Help at Discharge: Family;Available PRN/intermittently Type of Home: House Home Access: Stairs to enter   Entrance Stairs-Number of Steps: 1 Home Layout: One level Home Equipment: Walker - 2 wheels;Cane - single point;Bedside commode;Shower seat;Wheelchair - manual Additional Comments: L LE prosthesis    Prior Function Level of Independence: Independent with assistive device(s)         Comments: ambulates with SPC PRN     Hand Dominance        Extremity/Trunk Assessment   Upper Extremity Assessment Upper Extremity Assessment: Overall WFL for tasks assessed    Lower Extremity Assessment Lower Extremity Assessment: LLE deficits/detail LLE Deficits / Details: prior L transtibial amputation       Communication  Communication: No difficulties  Cognition Arousal/Alertness: Awake/alert Behavior During Therapy: WFL for tasks assessed/performed Overall Cognitive Status: Within Functional Limits for tasks assessed                                        General Comments      Exercises     Assessment/Plan    PT Assessment Patient needs continued PT services  PT Problem List Decreased  balance;Decreased mobility;Decreased coordination       PT Treatment Interventions DME instruction;Gait training;Stair training;Functional mobility training;Therapeutic activities;Therapeutic exercise;Balance training;Neuromuscular re-education;Patient/family education    PT Goals (Current goals can be found in the Care Plan section)  Acute Rehab PT Goals Patient Stated Goal: return home tomorrow PT Goal Formulation: With patient Time For Goal Achievement: 01/20/18 Potential to Achieve Goals: Good    Frequency Min 3X/week   Barriers to discharge        Co-evaluation               AM-PAC PT "6 Clicks" Daily Activity  Outcome Measure Difficulty turning over in bed (including adjusting bedclothes, sheets and blankets)?: None Difficulty moving from lying on back to sitting on the side of the bed? : None Difficulty sitting down on and standing up from a chair with arms (e.g., wheelchair, bedside commode, etc,.)?: Unable Help needed moving to and from a bed to chair (including a wheelchair)?: None Help needed walking in hospital room?: A Little Help needed climbing 3-5 steps with a railing? : A Little 6 Click Score: 19    End of Session Equipment Utilized During Treatment: Gait belt Activity Tolerance: Other (comment)(treatment limited secondary to dizziness) Patient left: in bed;with call bell/phone within reach;with bed alarm set Nurse Communication: Mobility status PT Visit Diagnosis: Dizziness and giddiness (R42)    Time: [id number] PT Time Calculation (min) (ACUTE ONLY): 28 min   Charges:   PT Evaluation $PT Eval Low Complexity: 1 Low PT Treatments $Therapeutic Activity: 8-22 mins   PT G Codes:        [place]villeJennifer Ensley Blas, PT, DPT [phone]   Alessandra BevelsJennifer M Kroy Sprung 01/06/2018, 5:20 PM

## 2018-01-06 NOTE — Plan of Care (Signed)
  Problem: Nutrition: Goal: Adequate nutrition will be maintained Outcome: Progressing Patient able to tolerate liquids post medication without any nausea or vomiting.  Pt will advance diet/oral intake for lunch.

## 2018-01-06 NOTE — Progress Notes (Signed)
PROGRESS NOTE    [name]  UJW:[id number] DOB: [birth year] DOA: 01/04/2018 PCP: Patient, No Pcp Per   Brief Narrative: Patient is a 67 year old male with past medical history of diabetes type 1, status post renal and pancreatic transplant   who presented to the emergency room with complaints of nausea vomiting for last 3 days.  Patient was unable to eat anything by mouth.  Denied any abdominal pain.   On presentation to the emergency department, patient was noted to be febrile, tachycardic and hypertensive. Patient was also found to be in mild DKA on presentation which improved with one-time IV insulin and IV fluids. His nausea and vomiting have improved. He  is still hypertensive.  Patient is on as needed labetalol.  We have also started his home antihypertensives.  Assessment & Plan:   Principal Problem:   Nausea and vomiting Active Problems:   Essential hypertension   Leukocytosis   S/P BKA (below knee amputation) unilateral (HCC)   DKA, type 1 (HCC)  Diabetic ketoacidosis/diabetes type I: On insulin at home.  Presented with mild anion gap and elevated blood sugars on presentation. Anion gap is closed.  We will continue IV fluids.  We will continue insulin. Diabetes coordinator consulted.Hb A1C is 11.2  Nausea and vomiting: Symptoms have improved.  Patient still remains weak.  Leukocytosis: Resolved.  Chest x-ray was clear on presentation.  Urinalysis not suggestive for UTI.  Lactic acidosis: Resolved with IV fluids.  Hyponatremia/hypokalemia: Stable.  Potassium supplemented  Hypertension: Home medications resumed.  Continue amlodipine,lisinopril and labetalol.  Status status post pancreatic and renal transplant: Continue CellCept and Prograf  Status post left BKA: Ambulates with the help of prosthetic leg.    DVT prophylaxis: Lovenox Code Status: Full Family Communication: None present at the bedside Disposition Plan: Home tomorrow   Consultants:  None  Procedures: None  Antimicrobials: None  Subjective: Patient seen and examined the bedside this morning.  His nausea and vomiting have improved.  He denies any abdominal pain.  Still feels weak and unable to walk.The patient left the office before the visit was finished. BObjective: Vitals:   01/06/18 0542 01/06/18 0839 01/06/18 1100 01/06/18 1311  BP: (!) 186/89 (!) 167/79 (!) 173/79 132/62  Pulse: (!) 101 (!) 102    Resp: 18 (!) 22    Temp: 98.2 F (36.8 C) 98 F (36.7 C)    TempSrc:  Oral    SpO2: 100% 100%    Weight:      Height:        Intake/Output Summary (Last 24 hours) at 01/06/2018 1412 Last data filed at 01/06/2018 1000 Gross per 24 hour  Intake 1150 ml  Output 1600 ml  Net -450 ml   Filed Weights   01/04/18 2128  Weight: 79.4 kg (175 lb)    Examination:  General exam:Not in distress,average built,weak HEENT:PERRL,Oral mucosa moist, Ear/Nose normal on gross exam Respiratory system: Bilateral equal air entry, normal vesicular breath sounds, no wheezes or crackles  Cardiovascular system: S1 & S2 heard, RRR. No JVD, murmurs, rubs, gallops or clicks. No pedal edema. Gastrointestinal system: Abdomen is nondistended, soft and nontender. No organomegaly or masses felt. Normal bowel sounds heard. Central nervous system: Alert and oriented. No focal neurological deficits. Extremities: No edema, no clubbing ,no cyanosis, distal peripheral pulses palpable.Left BKA Skin: No rashes, lesions or ulcers,no icterus ,no pallor MSK: Normal muscle bulk,tone ,power Psychiatry: Judgement and insight appear normal. Mood & affect appropriate.     Data Reviewed: I have  personally reviewed following labs and imaging studies  CBC: Recent Labs  Lab 01/04/18 2149 01/05/18 0155 01/05/18 0457  WBC 14.9*  --  8.7  HGB 17.3* 18.0* 14.0  HCT 50.7 53.0* 40.8  MCV 79.1  --  79.4  PLT 186  --  134*   Basic Metabolic Panel: Recent Labs  Lab 01/04/18 2149 01/05/18 0155  01/05/18 0457 01/06/18 0644  NA 128* 135 133* 132*  K 4.0 3.6 3.2* 3.5  CL 90* 99* 102 101  CO2 20*  --  17* 17*  GLUCOSE 321* 206* 189* 165*  BUN 24* 27* 20 19  CREATININE 1.09 0.80 0.83 0.85  CALCIUM 10.1  --  7.0* 9.0   GFR: Estimated Creatinine Clearance: 90.7 mL/min (by C-G formula based on SCr of 0.85 mg/dL). Liver Function Tests: Recent Labs  Lab 01/04/18 2149  AST 13*  ALT 13*  ALKPHOS 123  BILITOT 1.7*  PROT 8.0  ALBUMIN 4.8   Recent Labs  Lab 01/04/18 2149  LIPASE 26   No results for input(s): AMMONIA in the last 168 hours. Coagulation Profile: No results for input(s): INR, PROTIME in the last 168 hours. Cardiac Enzymes: No results for input(s): CKTOTAL, CKMB, CKMBINDEX, TROPONINI in the last 168 hours. BNP (last 3 results) No results for input(s): PROBNP in the last 8760 hours. HbA1C: Recent Labs    01/05/18 0457  HGBA1C 11.2*   CBG: Recent Labs  Lab 01/05/18 1154 01/05/18 1746 01/05/18 2247 01/06/18 0813 01/06/18 1214  GLUCAP 204* 219* 171* 170* 147*   Lipid Profile: No results for input(s): CHOL, HDL, LDLCALC, TRIG, CHOLHDL, LDLDIRECT in the last 72 hours. Thyroid Function Tests: No results for input(s): TSH, T4TOTAL, FREET4, T3FREE, THYROIDAB in the last 72 hours. Anemia Panel: No results for input(s): VITAMINB12, FOLATE, FERRITIN, TIBC, IRON, RETICCTPCT in the last 72 hours. Sepsis Labs: Recent Labs  Lab 01/04/18 2232 01/05/18 0133 01/05/18 0457 01/05/18 0956  LATICACIDVEN 2.64* 2.63* 2.4* 1.4    No results found for this or any previous visit (from the past 240 hour(s)).       Radiology Studies: Dg Abdomen Acute W/chest  Result Date: 01/05/2018 CLINICAL DATA:  Nausea and vomiting x2 days. EXAM: DG ABDOMEN ACUTE W/ 1V CHEST COMPARISON:  07/24/2017 CXR and abdomen radiographs FINDINGS: Stable cardiomegaly. No aortic aneurysm. Clear lungs. No free air beneath the diaphragm. Unremarkable bowel gas pattern. Mild lower lumbar  facet arthropathy at L4-5 and L5-S1. No radiopaque calculi. Cholecystectomy clips in the right upper quadrant. IMPRESSION: Negative abdominal radiographs.  No acute cardiopulmonary disease. Electronically Signed   By: Tollie Eth M.D.   On: 01/05/2018 00:31        Scheduled Meds: . amLODipine  10 mg Oral Daily  . aspirin EC  81 mg Oral Daily  . atorvastatin  80 mg Oral QHS  . enoxaparin (LOVENOX) injection  40 mg Subcutaneous Q24H  . insulin aspart  0-9 Units Subcutaneous TID WC  . insulin detemir  10 Units Subcutaneous BID  . labetalol  200 mg Oral BID  . lisinopril  10 mg Oral Daily  . mycophenolate  1,000 mg Oral BID  . pantoprazole  40 mg Oral Daily  . tacrolimus  5 mg Oral BID   Continuous Infusions: . sodium chloride 75 mL/hr at 01/06/18 0400     LOS: 1 day    Time spent: 25 mins.More than 50% of that time was spent in counseling and/or coordination of care.  Please Note: This patient record  was dictated using Animal nutritionistDragon software. Chart creation errors have been sought, but may not always have been located. Such creation errors do not reflect on the Standard of Medical Care.     Burnadette PopAmrit Oluwadarasimi Favor, MD [facility] Pager [phone]  If 7PM-7AM, please contact night-coverage [email or web address] Password [email or web address] 01/06/2018, 2:12 PM

## 2018-01-07 LAB — BASIC METABOLIC PANEL
Anion gap: 9 (ref 5–15)
BUN: 12 mg/dL (ref 6–20)
CO2: 23 mmol/L (ref 22–32)
Calcium: 8.7 mg/dL — ABNORMAL LOW (ref 8.9–10.3)
Chloride: 103 mmol/L (ref 101–111)
Creatinine, Ser: 0.7 mg/dL (ref 0.61–1.24)
GFR calc Af Amer: >60 mL/min (ref >60)
GFR calc non Af Amer: >60 mL/min (ref >60)
Glucose, Bld: 104 mg/dL — ABNORMAL HIGH (ref 65–99)
Potassium: 2.9 mmol/L — ABNORMAL LOW (ref 3.5–5.1)
Sodium: 135 mmol/L (ref 135–145)

## 2018-01-07 LAB — POTASSIUM: Potassium: 3.4 mmol/L — ABNORMAL LOW (ref 3.5–5.1)

## 2018-01-07 LAB — GLUCOSE, CAPILLARY
Glucose-Capillary: 91 mg/dL (ref 65–99)
Glucose-Capillary: 193 mg/dL — ABNORMAL HIGH (ref 65–99)

## 2018-01-07 MED ORDER — POTASSIUM CHLORIDE ER 20 MEQ PO TBCR: 20.0000 meq | EXTENDED_RELEASE_TABLET | Freq: Every day | ORAL | 0 refills | Status: DC

## 2018-01-07 MED ORDER — POTASSIUM CHLORIDE CRYS ER 20 MEQ PO TBCR
40.0000 meq | EXTENDED_RELEASE_TABLET | Freq: Once | ORAL | Status: AC
  Administered 2018-01-07: 40 meq via ORAL
  Filled 2018-01-07: qty 2

## 2018-01-07 MED ORDER — LISINOPRIL 10 MG PO TABS: 10.0000 mg | ORAL_TABLET | Freq: Every day | ORAL | 0 refills | Status: DC

## 2018-01-07 MED ORDER — ONDANSETRON HCL 4 MG PO TABS: 4.0000 mg | ORAL_TABLET | Freq: Four times a day (QID) | ORAL | 0 refills | Status: DC | PRN

## 2018-01-07 MED ORDER — POTASSIUM CHLORIDE 10 MEQ/100ML IV SOLN
10.0000 meq | INTRAVENOUS | Status: DC
  Administered 2018-01-07: 10 meq via INTRAVENOUS
  Filled 2018-01-07: qty 100

## 2018-01-07 NOTE — Progress Notes (Addendum)
CM received consult: Needs PCP and endocrinologist if possible. Patient [place] [facility] [place]. Thanks, Inpatient Diabetes. NCM called April/[facility] Coord[facility] [phone] regarding above needs ( endocrinologist, PCP), voice message left per NCM. Await call back. Gae GallopAngela Symphony Demuro RN,BSN,CM

## 2018-01-07 NOTE — Discharge Summary (Signed)
Physician Discharge Summary  [name] QMV:[id number] DOB: 1950/10/28 DOA: 01/04/2018  PCP: Patient, No Pcp Per  Admit date: 01/04/2018 Discharge date: 01/07/2018  Admitted From: Home Disposition:  Home  Discharge Condition: Stable CODE STATUS:Full Diet recommendation: Carbohydrate consisitent Brief/Interim Summary: Patient is a 67 year old male with past medical history of diabetes type 1, status post renal and pancreatic transplantwho presented to the emergency room with complaints of nausea vomiting for last 3 days. Patient was unable to eat anything by mouth. Deniedany abdominal pain.  On presentation to the emergency department, patient was noted to be  tachycardic and hypertensive. Patient was also found to be in mild DKA on presentation which improved with one-time IV insulin and IV fluids. His nausea and vomiting have now resolved. He is normotensive now.  Diabetic coordinator was also consulted.  Recommended to continue his insulin regimen from home on discharge.   Following problems were addressed during his hospitalization:  Diabetic ketoacidosis/diabetes type I: On insulin at home. Presented with mild anion gap and elevated blood sugars on presentation. Anion gap has closed.  Diabetes coordinator consulted.Hb A1C is 11.2.  Recommended to continue his home regimen on discharge. Patient will benefit with follow-up with Endocrinology as an outpatient.  Nausea and vomiting: Symptoms have improved.    Leukocytosis: Resolved.  Chest x-ray was clear on presentation.  Urinalysis not suggestive for UTI.  Lactic acidosis: Resolved with IV fluids.  Hyponatremia/hypokalemia:  Potassium supplemented.  Continue potassium supplementation on discharge.  Check potassium level in a week.  Hypertension: Home medications resumed.  Continue amlodipine,lisinopril and labetalol.  Dose of lisinopril increased to 20 mg daily.  Status status post pancreatic and renal transplant:  Continue CellCept and Prograf.  Status post left BKA: Ambulates with the help of prosthetic leg.  Physical therapy evaluated the patient and did not put any specific recommendation.   Discharge Diagnoses:  Principal Problem:   Nausea and vomiting Active Problems:   Essential hypertension   Leukocytosis   S/P BKA (below knee amputation) unilateral (HCC)   DKA, type 1 (HCC)    Discharge Instructions  Discharge Instructions    Diet - low sodium heart healthy   Complete by:  As directed    Discharge instructions   Complete by:  As directed    1) Follow up with your PCP in a week. 2) Take prescribed medications as instructed. 3) Monitor your blood sugars at home. 4) Do a BMP test to check potassium level and your kidney function during follow up with your PMD.   Increase activity slowly   Complete by:  As directed      Allergies as of 01/07/2018   No Known Allergies     Medication List    TAKE these medications   amLODipine 10 MG tablet Commonly known as:  NORVASC Take 10 mg by mouth daily.   aspirin EC 81 MG tablet Take 81 mg by mouth daily.   atorvastatin 80 MG tablet Commonly known as:  LIPITOR Take 80 mg by mouth at bedtime.   furosemide 20 MG tablet Commonly known as:  LASIX Take 1 tablet (20 mg total) by mouth daily.   insulin detemir 100 UNIT/ML injection Commonly known as:  LEVEMIR Inject 0.15 mLs (15 Units total) into the skin 2 (two) times daily.   labetalol 100 MG tablet Commonly known as:  NORMODYNE Take 2 tablets (200 mg total) by mouth 2 (two) times daily.   lisinopril 10 MG tablet Commonly known as:  PRINIVIL,ZESTRIL Take 1  tablet (10 mg total) by mouth daily. What changed:    medication strength  how much to take   metoCLOPramide 5 MG tablet Commonly known as:  REGLAN Take 1 tablet (5 mg total) by mouth 3 (three) times daily for 3 days.   mycophenolate 250 MG capsule Commonly known as:  CELLCEPT Take 1,000 mg by mouth 2 (two)  times daily.   ondansetron 4 MG tablet Commonly known as:  ZOFRAN Take 1 tablet (4 mg total) by mouth every 6 (six) hours as needed for nausea.   pantoprazole 40 MG tablet Commonly known as:  PROTONIX Take 1 tablet (40 mg total) by mouth daily.   Potassium Chloride ER 20 MEQ Tbcr Take 20 mEq by mouth daily.   tacrolimus 5 MG capsule Commonly known as:  PROGRAF Take 5 mg by mouth 2 (two) times daily.       No Known Allergies  Consultations: None  Procedures/Studies: Dg Abdomen Acute W/chest  Result Date: 01/05/2018 CLINICAL DATA:  Nausea and vomiting x2 days. EXAM: DG ABDOMEN ACUTE W/ 1V CHEST COMPARISON:  07/24/2017 CXR and abdomen radiographs FINDINGS: Stable cardiomegaly. No aortic aneurysm. Clear lungs. No free air beneath the diaphragm. Unremarkable bowel gas pattern. Mild lower lumbar facet arthropathy at L4-5 and L5-S1. No radiopaque calculi. Cholecystectomy clips in the right upper quadrant. IMPRESSION: Negative abdominal radiographs.  No acute cardiopulmonary disease. Electronically Signed   By: Tollie Eth M.D.   On: 01/05/2018 00:31    (Echo, Carotid, EGD, Colonoscopy, ERCP)    Subjective: Patient seen and examined the bedside this morning.  He looks very comfortable today.  Nausea and vomiting have improved.  Potassium was noted to be low this morning and was supplemented.  Stable for discharge to home today.  Discharge Exam: Vitals:   01/07/18 0500 01/07/18 1412  BP: (!) 156/70 116/61  Pulse: 83 79  Resp: 19 17  Temp: 99.3 F (37.4 C) 97.8 F (36.6 C)  SpO2: 99% 100%   Vitals:   01/06/18 1450 01/06/18 2122 01/07/18 0500 01/07/18 1412  BP: 135/67 140/69 (!) 156/70 116/61  Pulse: 87 92 83 79  Resp: 20 18 19 17   Temp: 99.3 F (37.4 C) 99.4 F (37.4 C) 99.3 F (37.4 C) 97.8 F (36.6 C)  TempSrc: Oral  Oral Oral  SpO2: 99% 100% 99% 100%  Weight:      Height:        General: Pt is alert, awake, not in acute distress Cardiovascular: RRR, S1/S2 +,  no rubs, no gallops Respiratory: CTA bilaterally, no wheezing, no rhonchi Abdominal: Soft, NT, ND, bowel sounds + Extremities: no edema, no cyanosis    The results of significant diagnostics from this hospitalization (including imaging, microbiology, ancillary and laboratory) are listed below for reference.     Microbiology: No results found for this or any previous visit (from the past 240 hour(s)).   Labs: BNP (last 3 results) No results for input(s): BNP in the last 8760 hours. Basic Metabolic Panel: Recent Labs  Lab 01/04/18 2149 01/05/18 0155 01/05/18 0457 01/06/18 0644 01/07/18 0500 01/07/18 1304  NA 128* 135 133* 132* 135  --   K 4.0 3.6 3.2* 3.5 2.9* 3.4*  CL 90* 99* 102 101 103  --   CO2 20*  --  17* 17* 23  --   GLUCOSE 321* 206* 189* 165* 104*  --   BUN 24* 27* 20 19 12   --   CREATININE 1.09 0.80 0.83 0.85 0.70  --  CALCIUM 10.1  --  7.0* 9.0 8.7*  --    Liver Function Tests: Recent Labs  Lab 01/04/18 2149  AST 13*  ALT 13*  ALKPHOS 123  BILITOT 1.7*  PROT 8.0  ALBUMIN 4.8   Recent Labs  Lab 01/04/18 2149  LIPASE 26   No results for input(s): AMMONIA in the last 168 hours. CBC: Recent Labs  Lab 01/04/18 2149 01/05/18 0155 01/05/18 0457  WBC 14.9*  --  8.7  HGB 17.3* 18.0* 14.0  HCT 50.7 53.0* 40.8  MCV 79.1  --  79.4  PLT 186  --  134*   Cardiac Enzymes: No results for input(s): CKTOTAL, CKMB, CKMBINDEX, TROPONINI in the last 168 hours. BNP: Invalid input(s): POCBNP CBG: Recent Labs  Lab 01/06/18 1214 01/06/18 1657 01/06/18 2123 01/07/18 0804 01/07/18 1226  GLUCAP 147* 209* 155* 91 193*   D-Dimer No results for input(s): DDIMER in the last 72 hours. Hgb A1c Recent Labs    01/05/18 0457  HGBA1C 11.2*   Lipid Profile No results for input(s): CHOL, HDL, LDLCALC, TRIG, CHOLHDL, LDLDIRECT in the last 72 hours. Thyroid function studies No results for input(s): TSH, T4TOTAL, T3FREE, THYROIDAB in the last 72 hours.  Invalid  input(s): FREET3 Anemia work up No results for input(s): VITAMINB12, FOLATE, FERRITIN, TIBC, IRON, RETICCTPCT in the last 72 hours. Urinalysis    Component Value Date/Time   COLORURINE YELLOW 01/05/2018 0410   APPEARANCEUR CLEAR 01/05/2018 0410   LABSPEC 1.029 01/05/2018 0410   PHURINE 6.0 01/05/2018 0410   GLUCOSEU >=500 (A) 01/05/2018 0410   HGBUR SMALL (A) 01/05/2018 0410   BILIRUBINUR NEGATIVE 01/05/2018 0410   KETONESUR 80 (A) 01/05/2018 0410   PROTEINUR 100 (A) 01/05/2018 0410   UROBILINOGEN 1.0 09/29/2013 2339   NITRITE NEGATIVE 01/05/2018 0410   LEUKOCYTESUR NEGATIVE 01/05/2018 0410   Sepsis Labs Invalid input(s): PROCALCITONIN,  WBC,  LACTICIDVEN Microbiology No results found for this or any previous visit (from the past 240 hour(s)).   Time coordinating discharge: 35 minutes  SIGNED:   Burnadette PopAmrit Alonda Weaber, MD  [facility] 01/07/2018, 2:15 PM Pager [phone]  If 7PM-7AM, please contact night-coverage [email or web address] Password [email or web address]

## 2018-01-07 NOTE — Consult Note (Signed)
   [facility]   01/07/2018  [name] July 26, 1951 [id number]    [facility] follow up.   Spoke with [name] at bedside to confirm Primary Care Provider.   He [facility]. States he is trying to find a local PCP as well.   Will not attempt to engage for [facility] at this time due to not having a current  [facility] Provider.   Will discuss with inpatient RNCM.    Raiford NobleAtika Clella Mckeel, MSN-Ed, RN,BSN [facility] Care Management Hospital Liaison [phone]

## 2018-01-07 NOTE — Plan of Care (Signed)
  Problem: Nutrition: Goal: Adequate nutrition will be maintained Outcome: Progressing  Pt tolerating oral intake without complaints of nausea or vomiting.  Diet advanced to soft foods.  Patient able to participate in choice selections.

## 2018-03-29 DIAGNOSIS — Z940 Kidney transplant status: Secondary | ICD-10-CM | POA: Diagnosis not present

## 2018-03-29 DIAGNOSIS — E113599 Type 2 diabetes mellitus with proliferative diabetic retinopathy without macular edema, unspecified eye: Secondary | ICD-10-CM | POA: Diagnosis not present

## 2018-03-29 DIAGNOSIS — E785 Hyperlipidemia, unspecified: Secondary | ICD-10-CM | POA: Diagnosis not present

## 2018-03-29 DIAGNOSIS — I10 Essential (primary) hypertension: Secondary | ICD-10-CM | POA: Diagnosis not present

## 2018-07-26 ENCOUNTER — Ambulatory Visit: Payer: Medicare Other | Admitting: Family

## 2018-07-27 ENCOUNTER — Encounter (HOSPITAL_COMMUNITY): Payer: Self-pay | Admitting: Emergency Medicine

## 2018-07-27 ENCOUNTER — Emergency Department (HOSPITAL_COMMUNITY): Payer: Medicare Other

## 2018-07-27 ENCOUNTER — Inpatient Hospital Stay (HOSPITAL_COMMUNITY)
Admission: EM | Admit: 2018-07-27 | Discharge: 2018-07-29 | DRG: 305 | Disposition: A | Discharge status: Home / Self Care | Payer: Medicare Other | Attending: Internal Medicine | Admitting: Internal Medicine

## 2018-07-27 ENCOUNTER — Ambulatory Visit: Payer: Self-pay | Admitting: Podiatry

## 2018-07-27 ENCOUNTER — Other Ambulatory Visit: Payer: Self-pay

## 2018-07-27 DIAGNOSIS — I739 Peripheral vascular disease, unspecified: Secondary | ICD-10-CM | POA: Diagnosis present

## 2018-07-27 DIAGNOSIS — Z79899 Other long term (current) drug therapy: Secondary | ICD-10-CM

## 2018-07-27 DIAGNOSIS — E7220 Disorder of urea cycle metabolism, unspecified: Secondary | ICD-10-CM | POA: Diagnosis not present

## 2018-07-27 DIAGNOSIS — Z7982 Long term (current) use of aspirin: Secondary | ICD-10-CM

## 2018-07-27 DIAGNOSIS — R61 Generalized hyperhidrosis: Secondary | ICD-10-CM | POA: Diagnosis not present

## 2018-07-27 DIAGNOSIS — I4581 Long QT syndrome: Secondary | ICD-10-CM | POA: Diagnosis present

## 2018-07-27 DIAGNOSIS — R400 Somnolence: Secondary | ICD-10-CM | POA: Diagnosis not present

## 2018-07-27 DIAGNOSIS — R112 Nausea with vomiting, unspecified: Secondary | ICD-10-CM | POA: Diagnosis not present

## 2018-07-27 DIAGNOSIS — I10 Essential (primary) hypertension: Secondary | ICD-10-CM | POA: Diagnosis not present

## 2018-07-27 DIAGNOSIS — G4730 Sleep apnea, unspecified: Secondary | ICD-10-CM | POA: Diagnosis present

## 2018-07-27 DIAGNOSIS — R4182 Altered mental status, unspecified: Secondary | ICD-10-CM | POA: Diagnosis not present

## 2018-07-27 DIAGNOSIS — I161 Hypertensive emergency: Secondary | ICD-10-CM | POA: Diagnosis not present

## 2018-07-27 DIAGNOSIS — E876 Hypokalemia: Secondary | ICD-10-CM | POA: Diagnosis not present

## 2018-07-27 DIAGNOSIS — E161 Other hypoglycemia: Secondary | ICD-10-CM | POA: Diagnosis not present

## 2018-07-27 DIAGNOSIS — E1151 Type 2 diabetes mellitus with diabetic peripheral angiopathy without gangrene: Secondary | ICD-10-CM | POA: Diagnosis present

## 2018-07-27 DIAGNOSIS — E162 Hypoglycemia, unspecified: Secondary | ICD-10-CM | POA: Diagnosis not present

## 2018-07-27 DIAGNOSIS — K219 Gastro-esophageal reflux disease without esophagitis: Secondary | ICD-10-CM | POA: Diagnosis present

## 2018-07-27 DIAGNOSIS — Z8249 Family history of ischemic heart disease and other diseases of the circulatory system: Secondary | ICD-10-CM

## 2018-07-27 DIAGNOSIS — Z89512 Acquired absence of left leg below knee: Secondary | ICD-10-CM

## 2018-07-27 DIAGNOSIS — Z9483 Pancreas transplant status: Secondary | ICD-10-CM

## 2018-07-27 DIAGNOSIS — F419 Anxiety disorder, unspecified: Secondary | ICD-10-CM | POA: Diagnosis present

## 2018-07-27 DIAGNOSIS — Z8673 Personal history of transient ischemic attack (TIA), and cerebral infarction without residual deficits: Secondary | ICD-10-CM

## 2018-07-27 DIAGNOSIS — E785 Hyperlipidemia, unspecified: Secondary | ICD-10-CM | POA: Diagnosis present

## 2018-07-27 DIAGNOSIS — E875 Hyperkalemia: Secondary | ICD-10-CM | POA: Diagnosis present

## 2018-07-27 DIAGNOSIS — I160 Hypertensive urgency: Principal | ICD-10-CM | POA: Diagnosis present

## 2018-07-27 DIAGNOSIS — F1210 Cannabis abuse, uncomplicated: Secondary | ICD-10-CM | POA: Diagnosis present

## 2018-07-27 DIAGNOSIS — Z87891 Personal history of nicotine dependence: Secondary | ICD-10-CM

## 2018-07-27 DIAGNOSIS — H548 Legal blindness, as defined in USA: Secondary | ICD-10-CM | POA: Diagnosis present

## 2018-07-27 DIAGNOSIS — E1051 Type 1 diabetes mellitus with diabetic peripheral angiopathy without gangrene: Secondary | ICD-10-CM | POA: Diagnosis present

## 2018-07-27 DIAGNOSIS — Z7151 Drug abuse counseling and surveillance of drug abuser: Secondary | ICD-10-CM

## 2018-07-27 DIAGNOSIS — Z794 Long term (current) use of insulin: Secondary | ICD-10-CM

## 2018-07-27 DIAGNOSIS — Z940 Kidney transplant status: Secondary | ICD-10-CM

## 2018-07-27 DIAGNOSIS — F4310 Post-traumatic stress disorder, unspecified: Secondary | ICD-10-CM | POA: Diagnosis present

## 2018-07-27 LAB — I-STAT CG4 LACTIC ACID, ED: Lactic Acid, Venous: 2.41 mmol/L (ref 0.5–1.9)

## 2018-07-27 LAB — I-STAT TROPONIN, ED: Troponin i, poc: 0.01 ng/mL (ref 0.00–0.08)

## 2018-07-27 LAB — CBG MONITORING, ED: Glucose-Capillary: 258 mg/dL — ABNORMAL HIGH (ref 70–99)

## 2018-07-27 MED ORDER — LORAZEPAM 2 MG/ML IJ SOLN
1.0000 mg | Freq: Once | INTRAMUSCULAR | Status: AC
  Administered 2018-07-27: 1 mg via INTRAVENOUS
  Filled 2018-07-27: qty 1

## 2018-07-27 MED ORDER — LABETALOL HCL 5 MG/ML IV SOLN
10.0000 mg | Freq: Once | INTRAVENOUS | Status: AC
  Administered 2018-07-27: 10 mg via INTRAVENOUS
  Filled 2018-07-27: qty 4

## 2018-07-27 NOTE — ED Triage Notes (Signed)
Pt arrived via [facility] from home c/o sudden onset nausea w/ vomiting this evening. Pt is alert and oriented x4, actively vomiting at time of triage. No solid emesis noted, only clear/frothy sputum. Pt stated he felt his glucose was low and took an oral glucose supplement prior to EMS arrival. EMS v/s 220/110, hr 108, resp 22, cbg 262, 96%ra. EMS reported profuse sweating upon arrival. Pt denies CP or sob.

## 2018-07-27 NOTE — ED Provider Notes (Signed)
[name] [facility] EMERGENCY DEPARTMENT Provider Note   CSN: [id number] Arrival date & time: 07/27/18  2016     History   Chief Complaint Chief Complaint  Patient presents with  . Emesis    HPI Earnestine Tuohey is a 67 y.o. male.  The history is provided by the patient, a friend and medical records. No language interpreter was used.  Altered Mental Status   This is a new problem. The current episode started 3 to 5 hours ago. The problem has not changed since onset.Associated symptoms include somnolence. Pertinent negatives include no seizures, no unresponsiveness and no weakness. His past medical history is significant for diabetes. His past medical history does not include seizures, dementia or head trauma.  Emesis   This is a new problem. The current episode started 3 to 5 hours ago. The problem occurs continuously. The problem has not changed since onset.The emesis has an appearance of stomach contents. There has been no fever. Associated symptoms include sweats. Pertinent negatives include no abdominal pain, no chills, no cough, no diarrhea, no fever and no headaches.    Past Medical History:  Diagnosis Date  . Anxiety   . CPAP (continuous positive airway pressure) dependence   . Diabetes mellitus without complication (HCC)   . GERD (gastroesophageal reflux disease)   . Hypertension   . Legally blind in left eye, as defined in [place]   . PTSD (post-traumatic stress disorder)   . PVD (peripheral vascular disease) (HCC) 01/17/2015  . Renal disorder   . Sleep apnea   . Thyroid disease     Patient Active Problem List   Diagnosis Date Noted  . DKA, type 1 (HCC) 01/05/2018  . Nausea and vomiting 10/19/2017  . Nausea & vomiting 10/18/2017  . Hematemesis 07/24/2017  . Hypertensive urgency 07/24/2017  . Acute GI bleeding 07/24/2017  . 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 S/P BKA (below knee amputation) unilateral (HCC)  02/20/2015  . Ischemic ulcer of toe (HCC)   . Peripheral vascular disease (HCC)   . PVD (peripheral vascular disease) (HCC) 01/17/2015  . Cellulitis and abscess of foot 01/13/2015  . Cellulitis and abscess of foot excluding toe 01/13/2015  . Diabetes mellitus without complication (HCC) 01/13/2015  . Leukocytosis 01/13/2015  . Diabetes mellitus with peripheral vascular disease (HCC) 12/19/2007  . DYSLIPIDEMIA 12/19/2007  . ANEMIA 12/19/2007  . ANXIETY 12/19/2007  . Essential hypertension 12/19/2007  . TRANSIENT ISCHEMIC ATTACK 12/19/2007  . PERIPHERAL VASCULAR DISEASE 12/19/2007  . RENAL FAILURE 12/19/2007    Past Surgical History:  Procedure Laterality Date  . ABDOMINAL AORTAGRAM N/A 01/21/2015   Procedure: ABDOMINAL Ronny Flurry;  Surgeon: Chuck Hint, MD;  Location: [facility] CATH LAB;  Service: Cardiovascular;  Laterality: N/A;  . AMPUTATION Left 01/23/2015   Procedure: FIRST RAY AMPUTATION/LEFT FOOT;  Surgeon: Nadara Mustard, MD;  Location: [facility] OR;  Service: Orthopedics;  Laterality: Left;  . AMPUTATION Left 02/20/2015   Procedure: AMPUTATION BELOW KNEE;  Surgeon: Nadara Mustard, MD;  Location: [facility] OR;  Service: Orthopedics;  Laterality: Left;  . AV FISTULA PLACEMENT    . COLONOSCOPY    . COMBINED KIDNEY-PANCREAS TRANSPLANT Left 2009  . INCISION AND DRAINAGE ABSCESS Left 01/13/2015   Procedure: INCISION AND DRAINAGE OF LEFT FOOT FIRST RAY ABCESS;  Surgeon: Kathryne Hitch, MD;  Location: [facility] OR;  Service: Orthopedics;  Laterality: Left;  . LOWER EXTREMITY ANGIOGRAM Left 01/21/2015   Procedure: LOWER EXTREMITY ANGIOGRAM;  Surgeon: Chuck Hint, MD;  Location: [facility] CATH LAB;  Service: Cardiovascular;  Laterality: Left;        Home Medications    Prior to Admission medications   Medication Sig Start Date End Date Taking? Authorizing Provider  amLODipine (NORVASC) 10 MG tablet Take 10 mg by mouth daily.    [provider]  aspirin EC 81 MG tablet Take 81 mg by  mouth daily.    [provider]  atorvastatin (LIPITOR) 80 MG tablet Take 80 mg by mouth at bedtime.    [provider]  furosemide (LASIX) 20 MG tablet Take 1 tablet (20 mg total) by mouth daily. Patient not taking: Reported on 10/18/2017 07/28/17   Osvaldo Shipper, MD  insulin detemir (LEVEMIR) 100 UNIT/ML injection Inject 0.15 mLs (15 Units total) into the skin 2 (two) times daily. 07/28/17   Osvaldo Shipper, MD  labetalol (NORMODYNE) 100 MG tablet Take 2 tablets (200 mg total) by mouth 2 (two) times daily. Patient not taking: Reported on 01/05/2018 07/28/17   Osvaldo Shipper, MD  lisinopril (PRINIVIL,ZESTRIL) 10 MG tablet Take 1 tablet (10 mg total) by mouth daily. 01/07/18   Burnadette Pop, MD  metoCLOPramide (REGLAN) 5 MG tablet Take 1 tablet (5 mg total) by mouth 3 (three) times daily for 3 days. Patient not taking: Reported on 01/05/2018 10/20/17 01/05/26  Leatha Gilding, MD  mycophenolate (CELLCEPT) 250 MG capsule Take 1,000 mg by mouth 2 (two) times daily.    [provider]  ondansetron (ZOFRAN) 4 MG tablet Take 1 tablet (4 mg total) by mouth every 6 (six) hours as needed for nausea. 01/07/18   Burnadette Pop, MD  pantoprazole (PROTONIX) 40 MG tablet Take 1 tablet (40 mg total) by mouth daily. 07/28/17   Osvaldo Shipper, MD  potassium chloride 20 MEQ TBCR Take 20 mEq by mouth daily. 01/07/18   Burnadette Pop, MD  tacrolimus (PROGRAF) 5 MG capsule Take 5 mg by mouth 2 (two) times daily.    [provider]    Family History Family History  Problem Relation Age of Onset  . Hypertension Other     Social History Social History   Tobacco Use  . Smoking status: Former Games developer  . Smokeless tobacco: Never Used  . Tobacco comment: quit i [id number]  Substance Use Topics  . Alcohol use: Yes    Comment: 1 beer rarely   . Drug use: No     Allergies   Patient has no known allergies.   Review of Systems Review of Systems  Unable to perform ROS:  Mental status change  Constitutional: Positive for diaphoresis and fatigue. Negative for chills and fever.  HENT: Negative for congestion.   Respiratory: Negative for cough, chest tightness and shortness of breath.   Cardiovascular: Negative for chest pain, palpitations and leg swelling.  Gastrointestinal: Positive for nausea and vomiting. Negative for abdominal pain, constipation and diarrhea.  Genitourinary: Negative for dysuria and flank pain.  Musculoskeletal: Negative for back pain, neck pain and neck stiffness.  Skin: Negative for rash.  Neurological: Negative for seizures, weakness, light-headedness, numbness and headaches.  All other systems reviewed and are negative.    Physical Exam Updated Vital Signs BP (!) 227/102   Pulse (!) 118   Resp 18   SpO2 100%   Physical Exam  Constitutional: He is oriented to person, place, and time. He appears well-developed and well-nourished. No distress.  HENT:  Head: Normocephalic and atraumatic.  Nose: Nose normal.  Mouth/Throat: Oropharynx  is clear and moist. No oropharyngeal exudate.  Eyes: Conjunctivae are normal.  Neck: Neck supple.  Cardiovascular: Regular rhythm and intact distal pulses. Tachycardia present.  Murmur heard. Pulmonary/Chest: Effort normal and breath sounds normal. No respiratory distress. He has no wheezes. He has no rales. He exhibits no tenderness.  Abdominal: Soft. He exhibits no distension. There is no tenderness. There is no rebound and no guarding.  Musculoskeletal: He exhibits no edema.  Neurological: He is alert and oriented to person, place, and time. He is not disoriented. He displays no tremor. No cranial nerve deficit or sensory deficit. He exhibits normal muscle tone.  Patient is somnolent but appropriate when aroused.  Patient moving all extremities. Normal sensation throughout.  Clear speech when awake.  Skin: Skin is warm. Capillary refill takes less than 2 seconds. He is diaphoretic. No erythema. No  pallor.  Psychiatric: He has a normal mood and affect.  Nursing note and vitals reviewed.    ED Treatments / Results  Labs (all labs ordered are listed, but only abnormal results are displayed) Labs Reviewed  COMPREHENSIVE METABOLIC PANEL - Abnormal; Notable for the following components:      Result Value   Potassium 5.9 (*)    Glucose, Bld 275 (*)    Total Bilirubin 1.8 (*)    All other components within normal limits  CBC WITH DIFFERENTIAL/PLATELET - Abnormal; Notable for the following components:   WBC 13.0 (*)    RBC 6.17 (*)    Neutro Abs 11.6 (*)    Abs Immature Granulocytes 0.18 (*)    All other components within normal limits  URINALYSIS, ROUTINE W REFLEX MICROSCOPIC - Abnormal; Notable for the following components:   Color, Urine STRAW (*)    Glucose, UA >=500 (*)    Hgb urine dipstick SMALL (*)    Ketones, ur 20 (*)    Protein, ur >=300 (*)    All other components within normal limits  AMMONIA - Abnormal; Notable for the following components:   Ammonia 61 (*)    All other components within normal limits  RAPID URINE DRUG SCREEN, HOSP PERFORMED - Abnormal; Notable for the following components:   Tetrahydrocannabinol POSITIVE (*)    All other components within normal limits  I-STAT CG4 LACTIC ACID, ED - Abnormal; Notable for the following components:   Lactic Acid, Venous 2.41 (*)    All other components within normal limits  CBG MONITORING, ED - Abnormal; Notable for the following components:   Glucose-Capillary 258 (*)    All other components within normal limits  I-STAT ARTERIAL BLOOD GAS, ED - Abnormal; Notable for the following components:   Acid-base deficit 3.0 (*)    All other components within normal limits  CULTURE, BLOOD (ROUTINE X 2)  CULTURE, BLOOD (ROUTINE X 2)  URINE CULTURE  MRSA PCR SCREENING  LIPASE, BLOOD  MAGNESIUM  TSH  ETHANOL  TACROLIMUS LEVEL  BASIC METABOLIC PANEL  CBC  TROPONIN I  TROPONIN I  TROPONIN I  HIV ANTIBODY (ROUTINE  TESTING W REFLEX)  I-STAT TROPONIN, ED  I-STAT CG4 LACTIC ACID, ED  I-STAT TROPONIN, ED    EKG EKG Interpretation  Date/Time:  Wednesday July 27 2018 20:33:37 EDT Ventricular Rate:  116 PR Interval:    QRS Duration: 91 QT Interval:  415 QTC Calculation: 577 R Axis:   82 Text Interpretation:  Ectopic atrial tachycardia, unifocal Borderline right axis deviation Consider left ventricular hypertrophy Prolonged QT interval Baseline wander in lead(s) V2 When compared to  prior, longer QTC and faster rate.  No STEMI Confirmed by Theda Belfast ([id number]) on 07/27/2018 9:09:54 PM   Radiology Dg Chest 2 View  Result Date: 07/27/2018 CLINICAL DATA:  Nausea and vomiting beginning tonight. EXAM: CHEST - 2 VIEW COMPARISON:  01/04/2018 FINDINGS: Lungs are somewhat hypoinflated without airspace consolidation or effusion. Cardiomediastinal silhouette and remainder of the exam is unchanged. IMPRESSION: No active cardiopulmonary disease. Electronically Signed   By: Elberta Fortis M.D.   On: 07/27/2018 23:15   Ct Head Wo Contrast  Result Date: 07/27/2018 CLINICAL DATA:  Hypertension and altered mental status. EXAM: CT HEAD WITHOUT CONTRAST TECHNIQUE: Contiguous axial images were obtained from the base of the skull through the vertex without intravenous contrast. COMPARISON:  09/29/2013 FINDINGS: Brain: Redemonstration of scattered microvascular ischemic disease of periventricular white matter. No large vascular territory infarct, intra-axial mass nor extra-axial fluid. No hydrocephalus. Midline fourth ventricle and basal cisterns. Intact cerebellum and brainstem. Vascular: Scattered atherosclerotic calcifications of middle cerebral arteries. No unexpected calcifications no hyperdense vessels. Skull: Intact bony calvarium. No significant calvarial soft tissue swelling. Sinuses/Orbits: Clear paranasal sinuses mastoids. Intact orbits and globes. Other: None IMPRESSION: No acute intracranial abnormality. Chronic  microvascular ischemic disease. Electronically Signed   By: Tollie Eth M.D.   On: 07/27/2018 22:54    Procedures Procedures (including critical care time)   CRITICAL CARE Performed by: Canary Brim Elon Lomeli Total critical care time: 35 minutes Critical care time was exclusive of separately billable procedures and treating other patients. Critical care was necessary to treat or prevent imminent or life-threatening deterioration. Critical care was time spent personally by me on the following activities: development of treatment plan with patient and/or surrogate as well as nursing, discussions with consultants, evaluation of patient's response to treatment, examination of patient, obtaining history from patient or surrogate, ordering and performing treatments and interventions, ordering and review of laboratory studies, ordering and review of radiographic studies, pulse oximetry and re-evaluation of patient's condition.  Medications Ordered in ED Medications  amLODipine (NORVASC) tablet 10 mg (has no administration in time range)  atorvastatin (LIPITOR) tablet 80 mg (has no administration in time range)  lisinopril (PRINIVIL,ZESTRIL) tablet 10 mg (has no administration in time range)  insulin detemir (LEVEMIR) injection 15 Units (has no administration in time range)  pantoprazole (PROTONIX) EC tablet 40 mg (has no administration in time range)  mycophenolate (CELLCEPT) capsule 1,000 mg (has no administration in time range)  tacrolimus (PROGRAF) capsule 5 mg (has no administration in time range)  acetaminophen (TYLENOL) tablet 650 mg (has no administration in time range)    Or  acetaminophen (TYLENOL) suppository 650 mg (has no administration in time range)  ondansetron (ZOFRAN) tablet 4 mg (has no administration in time range)    Or  ondansetron (ZOFRAN) injection 4 mg (has no administration in time range)  insulin aspart (novoLOG) injection 0-9 Units (has no administration in time range)   labetalol (NORMODYNE,TRANDATE) injection 10 mg (10 mg Intravenous Given 07/28/18 0214)  LORazepam (ATIVAN) injection 1 mg (1 mg Intravenous Given 07/27/18 2140)  labetalol (NORMODYNE,TRANDATE) injection 10 mg (10 mg Intravenous Given 07/27/18 2237)  labetalol (NORMODYNE,TRANDATE) injection 10 mg (10 mg Intravenous Given 07/28/18 0057)  sodium chloride 0.9 % bolus 1,000 mL (0 mLs Intravenous Stopped 07/28/18 0200)  LORazepam (ATIVAN) injection 0.5 mg (0.5 mg Intravenous Given 07/28/18 0102)     Initial Impression / Assessment and Plan / ED Course  I have reviewed the triage vital signs and the nursing notes.  Pertinent labs &  imaging results that were available during my care of the patient were reviewed by me and considered in my medical decision making (see chart for details).     [name] is a 67 y.o. 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  Patient is accompanied by friend who reports that patient started having nausea and vomiting this afternoon.  She reports that patient is in the care of the [place] and had a head CT "for some reason" earlier today.  Patient reports a normal bowel movement earlier today and has no abdominal pain.  He is still passing flatus.  He denies any urinary symptoms.  He denies any chest pain, palpitations, or shortness of breath.  According to EMS report, patient thought he was hypoglycemic and took some oral glucose supplements.  Patient's main concern on arrival is the nausea and vomiting.    On my initial evaluation, patient's blood pressure is over 220 systolic.  Patient is tachycardic.  Temperature is 96.9.  He is on room air and having normal option  saturations.  EKG showed a sinus tachycardia with a prolonged QT C.  With the prolonged QT, given his nausea we avoided medicine such as Zofran and used Ativan.  On my initial exam, patient is somnolent but arousable.  Patient is answering questions appropriately when aroused.  Patient has no focal neurologic deficits and no neck pain or neck stiffness.  He had pupils are reactive and normal extraocular movements.  No facial droop.  Clear speech.  Abdomen and chest were nontender.  Lungs were clear.  Patient is a left below-knee amputation.  Right leg and normal sensation and strength.  With patient's somnolence, nausea and vomiting as well as his severely elevated blood pressure, I am concerned about hypertensive emergency.  Patient was also very diaphoretic on my initial exam, will have work-up to look for occult infection given his immune suppression.  Patient had labetalol ordered initially, will re-dose if blood pressures not improved significantly.  Patient's glucose was rechecked and was not hypoglycemic.   Patient's lactic acid was elevated at 2.41.  Patient be given more labetalol for the persistent hypertension.  When labs begin to return, will admit for hypertensive emergency causing altered mental status.  Patient remained hypertensive and somnolent.  Ammonia elevated.  Fluids given for lactic acid.  Chest x-ray shows no pneumonia.  CT head shows no bleed.  Unclear etiology of somnolence aside from emergency.  Patient does have leukocytosis and lactic acidosis.  Will defer to hospitalist service for antibiotics as there is not a clear source of infection at this time.  Patient admitted to hospital service for further management.  Final Clinical Impressions(s) / ED Diagnoses   Final diagnoses:  Intractable vomiting with nausea, unspecified vomiting type  Somnolence  Hyperammonemia (HCC)  Hypertensive emergency    ED Discharge Orders    None     Clinical Impression: 1.  Intractable vomiting with nausea, unspecified vomiting type   2. Somnolence   3. Hyperammonemia (HCC)   4. Hypertensive emergency     Disposition: Admit  This note was prepared with assistance of Dragon voice recognition software. Occasional wrong-word or sound-a-like substitutions may have occurred due to the inherent limitations of voice recognition software.     Daoud Lobue, Canary Brim, MD  07/28/18 0311  

## 2018-07-28 ENCOUNTER — Encounter (HOSPITAL_COMMUNITY): Payer: Self-pay

## 2018-07-28 DIAGNOSIS — I160 Hypertensive urgency: Secondary | ICD-10-CM | POA: Diagnosis not present

## 2018-07-28 DIAGNOSIS — I10 Essential (primary) hypertension: Secondary | ICD-10-CM | POA: Diagnosis present

## 2018-07-28 DIAGNOSIS — R112 Nausea with vomiting, unspecified: Secondary | ICD-10-CM

## 2018-07-28 DIAGNOSIS — E875 Hyperkalemia: Secondary | ICD-10-CM | POA: Diagnosis present

## 2018-07-28 DIAGNOSIS — Z89512 Acquired absence of left leg below knee: Secondary | ICD-10-CM | POA: Diagnosis not present

## 2018-07-28 DIAGNOSIS — E1051 Type 1 diabetes mellitus with diabetic peripheral angiopathy without gangrene: Secondary | ICD-10-CM | POA: Diagnosis present

## 2018-07-28 DIAGNOSIS — Z8673 Personal history of transient ischemic attack (TIA), and cerebral infarction without residual deficits: Secondary | ICD-10-CM | POA: Diagnosis not present

## 2018-07-28 DIAGNOSIS — F1210 Cannabis abuse, uncomplicated: Secondary | ICD-10-CM | POA: Diagnosis present

## 2018-07-28 DIAGNOSIS — E1151 Type 2 diabetes mellitus with diabetic peripheral angiopathy without gangrene: Secondary | ICD-10-CM | POA: Diagnosis not present

## 2018-07-28 DIAGNOSIS — K219 Gastro-esophageal reflux disease without esophagitis: Secondary | ICD-10-CM | POA: Diagnosis present

## 2018-07-28 DIAGNOSIS — E7220 Disorder of urea cycle metabolism, unspecified: Secondary | ICD-10-CM | POA: Diagnosis present

## 2018-07-28 DIAGNOSIS — E876 Hypokalemia: Secondary | ICD-10-CM | POA: Diagnosis not present

## 2018-07-28 DIAGNOSIS — E785 Hyperlipidemia, unspecified: Secondary | ICD-10-CM | POA: Diagnosis present

## 2018-07-28 DIAGNOSIS — G4730 Sleep apnea, unspecified: Secondary | ICD-10-CM | POA: Diagnosis present

## 2018-07-28 DIAGNOSIS — Z8249 Family history of ischemic heart disease and other diseases of the circulatory system: Secondary | ICD-10-CM | POA: Diagnosis not present

## 2018-07-28 DIAGNOSIS — I4581 Long QT syndrome: Secondary | ICD-10-CM | POA: Diagnosis present

## 2018-07-28 DIAGNOSIS — Z7151 Drug abuse counseling and surveillance of drug abuser: Secondary | ICD-10-CM | POA: Diagnosis not present

## 2018-07-28 DIAGNOSIS — Z7982 Long term (current) use of aspirin: Secondary | ICD-10-CM | POA: Diagnosis not present

## 2018-07-28 DIAGNOSIS — F419 Anxiety disorder, unspecified: Secondary | ICD-10-CM | POA: Diagnosis present

## 2018-07-28 DIAGNOSIS — H548 Legal blindness, as defined in USA: Secondary | ICD-10-CM | POA: Diagnosis present

## 2018-07-28 DIAGNOSIS — Z940 Kidney transplant status: Secondary | ICD-10-CM | POA: Diagnosis not present

## 2018-07-28 DIAGNOSIS — Z9483 Pancreas transplant status: Secondary | ICD-10-CM | POA: Diagnosis not present

## 2018-07-28 DIAGNOSIS — Z79899 Other long term (current) drug therapy: Secondary | ICD-10-CM | POA: Diagnosis not present

## 2018-07-28 DIAGNOSIS — F4310 Post-traumatic stress disorder, unspecified: Secondary | ICD-10-CM | POA: Diagnosis present

## 2018-07-28 DIAGNOSIS — Z87891 Personal history of nicotine dependence: Secondary | ICD-10-CM | POA: Diagnosis not present

## 2018-07-28 DIAGNOSIS — Z794 Long term (current) use of insulin: Secondary | ICD-10-CM | POA: Diagnosis not present

## 2018-07-28 LAB — CBC
HCT: 50.4 % (ref 39.0–52.0)
Hemoglobin: 15.8 g/dL (ref 13.0–17.0)
MCH: 25.6 pg — ABNORMAL LOW (ref 26.0–34.0)
MCHC: 31.3 g/dL (ref 30.0–36.0)
MCV: 81.6 fL (ref 80.0–100.0)
Platelets: 132 10*3/uL — ABNORMAL LOW (ref 150–400)
RBC: 6.18 MIL/uL — ABNORMAL HIGH (ref 4.22–5.81)
RDW: 14.7 % (ref 11.5–15.5)
WBC: 12.5 10*3/uL — ABNORMAL HIGH (ref 4.0–10.5)
nRBC: 0 % (ref 0.0–0.2)

## 2018-07-28 LAB — CBC WITH DIFFERENTIAL/PLATELET
Abs Immature Granulocytes: 0.18 10*3/uL — ABNORMAL HIGH (ref 0.00–0.07)
Basophils Absolute: 0.1 10*3/uL (ref 0.0–0.1)
Basophils Relative: 1 %
Eosinophils Absolute: 0 10*3/uL (ref 0.0–0.5)
Eosinophils Relative: 0 %
HCT: 51.3 % (ref 39.0–52.0)
Hemoglobin: 16.1 g/dL (ref 13.0–17.0)
Immature Granulocytes: 1 %
Lymphocytes Relative: 6 %
Lymphs Abs: 0.8 10*3/uL (ref 0.7–4.0)
MCH: 26.1 pg (ref 26.0–34.0)
MCHC: 31.4 g/dL (ref 30.0–36.0)
MCV: 83.1 fL (ref 80.0–100.0)
Monocytes Absolute: 0.4 10*3/uL (ref 0.1–1.0)
Monocytes Relative: 3 %
Neutro Abs: 11.6 10*3/uL — ABNORMAL HIGH (ref 1.7–7.7)
Neutrophils Relative %: 89 %
Platelets: 167 10*3/uL (ref 150–400)
RBC: 6.17 MIL/uL — ABNORMAL HIGH (ref 4.22–5.81)
RDW: 14.9 % (ref 11.5–15.5)
WBC: 13 10*3/uL — ABNORMAL HIGH (ref 4.0–10.5)
nRBC: 0 % (ref 0.0–0.2)

## 2018-07-28 LAB — BASIC METABOLIC PANEL
Anion gap: 12 (ref 5–15)
BUN: 13 mg/dL (ref 8–23)
CO2: 22 mmol/L (ref 22–32)
Calcium: 9.5 mg/dL (ref 8.9–10.3)
Chloride: 103 mmol/L (ref 98–111)
Creatinine, Ser: 1.01 mg/dL (ref 0.61–1.24)
GFR calc Af Amer: >60 mL/min (ref >60)
GFR calc non Af Amer: >60 mL/min (ref >60)
Glucose, Bld: 279 mg/dL — ABNORMAL HIGH (ref 70–99)
Potassium: 3.8 mmol/L (ref 3.5–5.1)
Sodium: 137 mmol/L (ref 135–145)

## 2018-07-28 LAB — GLUCOSE, CAPILLARY
Glucose-Capillary: 164 mg/dL — ABNORMAL HIGH (ref 70–99)
Glucose-Capillary: 182 mg/dL — ABNORMAL HIGH (ref 70–99)
Glucose-Capillary: 195 mg/dL — ABNORMAL HIGH (ref 70–99)
Glucose-Capillary: 267 mg/dL — ABNORMAL HIGH (ref 70–99)
Glucose-Capillary: 278 mg/dL — ABNORMAL HIGH (ref 70–99)
Glucose-Capillary: 293 mg/dL — ABNORMAL HIGH (ref 70–99)

## 2018-07-28 LAB — URINALYSIS, ROUTINE W REFLEX MICROSCOPIC
Bacteria, UA: NONE SEEN
Bilirubin Urine: NEGATIVE
Glucose, UA: >=500 mg/dL — AB
Ketones, ur: 20 mg/dL — AB
Leukocytes, UA: NEGATIVE
Nitrite: NEGATIVE
Protein, ur: >=300 mg/dL — AB
Specific Gravity, Urine: 1.016 (ref 1.005–1.030)
pH: 6 (ref 5.0–8.0)

## 2018-07-28 LAB — RAPID URINE DRUG SCREEN, HOSP PERFORMED
Amphetamines: NOT DETECTED
Barbiturates: NOT DETECTED
Benzodiazepines: NOT DETECTED
Cocaine: NOT DETECTED
Opiates: NOT DETECTED
Tetrahydrocannabinol: POSITIVE — AB

## 2018-07-28 LAB — LIPASE, BLOOD: Lipase: 24 U/L (ref 11–51)

## 2018-07-28 LAB — COMPREHENSIVE METABOLIC PANEL
ALT: 16 U/L (ref 0–44)
AST: 34 U/L (ref 15–41)
Albumin: 4.9 g/dL (ref 3.5–5.0)
Alkaline Phosphatase: 101 U/L (ref 38–126)
Anion gap: 12 (ref 5–15)
BUN: 15 mg/dL (ref 8–23)
CO2: 22 mmol/L (ref 22–32)
Calcium: 9.8 mg/dL (ref 8.9–10.3)
Chloride: 102 mmol/L (ref 98–111)
Creatinine, Ser: 0.88 mg/dL (ref 0.61–1.24)
GFR calc Af Amer: >60 mL/min (ref >60)
GFR calc non Af Amer: >60 mL/min (ref >60)
Glucose, Bld: 275 mg/dL — ABNORMAL HIGH (ref 70–99)
Potassium: 5.9 mmol/L — ABNORMAL HIGH (ref 3.5–5.1)
Sodium: 136 mmol/L (ref 135–145)
Total Bilirubin: 1.8 mg/dL — ABNORMAL HIGH (ref 0.3–1.2)
Total Protein: 7.6 g/dL (ref 6.5–8.1)

## 2018-07-28 LAB — TSH: TSH: 1.347 u[IU]/mL (ref 0.350–4.500)

## 2018-07-28 LAB — TROPONIN I
Troponin I: <0.03 ng/mL (ref <0.03)
Troponin I: 0.04 ng/mL (ref <0.03)
Troponin I: 0.04 ng/mL (ref <0.03)

## 2018-07-28 LAB — I-STAT ARTERIAL BLOOD GAS, ED
Acid-base deficit: 3 mmol/L — ABNORMAL HIGH (ref 0.0–2.0)
Bicarbonate: 21 mmol/L (ref 20.0–28.0)
O2 Saturation: 97 %
Patient temperature: 98.6
TCO2: 22 mmol/L (ref 22–32)
pCO2 arterial: 33.2 mmHg (ref 32.0–48.0)
pH, Arterial: 7.41 (ref 7.350–7.450)
pO2, Arterial: 87 mmHg (ref 83.0–108.0)

## 2018-07-28 LAB — HIV ANTIBODY (ROUTINE TESTING W REFLEX): HIV Screen 4th Generation wRfx: NONREACTIVE

## 2018-07-28 LAB — I-STAT CG4 LACTIC ACID, ED: Lactic Acid, Venous: 1.85 mmol/L (ref 0.5–1.9)

## 2018-07-28 LAB — MRSA PCR SCREENING: MRSA by PCR: NEGATIVE

## 2018-07-28 LAB — MAGNESIUM
Magnesium: 1.8 mg/dL (ref 1.7–2.4)
Magnesium: 2.1 mg/dL (ref 1.7–2.4)

## 2018-07-28 LAB — ETHANOL: Alcohol, Ethyl (B): <10 mg/dL (ref <10)

## 2018-07-28 LAB — AMMONIA: Ammonia: 61 umol/L — ABNORMAL HIGH (ref 9–35)

## 2018-07-28 LAB — I-STAT TROPONIN, ED: Troponin i, poc: 0.02 ng/mL (ref 0.00–0.08)

## 2018-07-28 MED ORDER — SODIUM CHLORIDE 0.9 % IV BOLUS
1000.0000 mL | Freq: Once | INTRAVENOUS | Status: AC
  Administered 2018-07-28: 1000 mL via INTRAVENOUS

## 2018-07-28 MED ORDER — ONDANSETRON HCL 4 MG/2ML IJ SOLN: 4.0000 mg | Freq: Four times a day (QID) | INTRAMUSCULAR | Status: DC | PRN

## 2018-07-28 MED ORDER — ATORVASTATIN CALCIUM 20 MG PO TABS
80.0000 mg | ORAL_TABLET | Freq: Every day | ORAL | Status: DC
  Administered 2018-07-28: 80 mg via ORAL
  Filled 2018-07-28: qty 4

## 2018-07-28 MED ORDER — LABETALOL HCL 200 MG PO TABS
200.0000 mg | ORAL_TABLET | Freq: Two times a day (BID) | ORAL | Status: DC
  Administered 2018-07-28 – 2018-07-29 (×3): 200 mg via ORAL
  Filled 2018-07-28 (×3): qty 1

## 2018-07-28 MED ORDER — HYDRALAZINE HCL 20 MG/ML IJ SOLN
10.0000 mg | Freq: Four times a day (QID) | INTRAMUSCULAR | Status: DC | PRN
  Administered 2018-07-28: 10 mg via INTRAVENOUS
  Filled 2018-07-28: qty 1

## 2018-07-28 MED ORDER — INSULIN ASPART 100 UNIT/ML ~~LOC~~ SOLN
0.0000 [IU] | Freq: Three times a day (TID) | SUBCUTANEOUS | Status: DC
  Administered 2018-07-28: 5 [IU] via SUBCUTANEOUS
  Administered 2018-07-28: 2 [IU] via SUBCUTANEOUS
  Administered 2018-07-28: 5 [IU] via SUBCUTANEOUS
  Administered 2018-07-29: 3 [IU] via SUBCUTANEOUS

## 2018-07-28 MED ORDER — TACROLIMUS 1 MG PO CAPS
5.0000 mg | ORAL_CAPSULE | Freq: Two times a day (BID) | ORAL | Status: DC
  Administered 2018-07-28 – 2018-07-29 (×3): 5 mg via ORAL
  Filled 2018-07-28 (×3): qty 5

## 2018-07-28 MED ORDER — ACETAMINOPHEN 325 MG PO TABS: 650.0000 mg | ORAL_TABLET | Freq: Four times a day (QID) | ORAL | Status: DC | PRN

## 2018-07-28 MED ORDER — AMLODIPINE BESYLATE 5 MG PO TABS
10.0000 mg | ORAL_TABLET | Freq: Every day | ORAL | Status: DC
  Administered 2018-07-28 – 2018-07-29 (×2): 10 mg via ORAL
  Filled 2018-07-28 (×2): qty 2

## 2018-07-28 MED ORDER — HYDRALAZINE HCL 50 MG PO TABS
50.0000 mg | ORAL_TABLET | Freq: Three times a day (TID) | ORAL | Status: DC
  Administered 2018-07-28 – 2018-07-29 (×3): 50 mg via ORAL
  Filled 2018-07-28 (×3): qty 1

## 2018-07-28 MED ORDER — LORAZEPAM 2 MG/ML IJ SOLN
0.5000 mg | Freq: Once | INTRAMUSCULAR | Status: AC
  Administered 2018-07-28: 0.5 mg via INTRAVENOUS
  Filled 2018-07-28: qty 1

## 2018-07-28 MED ORDER — SODIUM POLYSTYRENE SULFONATE 15 GM/60ML PO SUSP
15.0000 g | Freq: Once | ORAL | Status: AC
  Administered 2018-07-28: 15 g via ORAL
  Filled 2018-07-28: qty 60

## 2018-07-28 MED ORDER — HEPARIN SODIUM (PORCINE) 5000 UNIT/ML IJ SOLN
5000.0000 [IU] | Freq: Three times a day (TID) | INTRAMUSCULAR | Status: DC
  Administered 2018-07-28 – 2018-07-29 (×3): 5000 [IU] via SUBCUTANEOUS
  Filled 2018-07-28 (×3): qty 1

## 2018-07-28 MED ORDER — LABETALOL HCL 5 MG/ML IV SOLN
10.0000 mg | INTRAVENOUS | Status: DC | PRN
  Administered 2018-07-28 (×3): 10 mg via INTRAVENOUS
  Filled 2018-07-28 (×3): qty 4

## 2018-07-28 MED ORDER — PROMETHAZINE HCL 25 MG/ML IJ SOLN
12.5000 mg | Freq: Four times a day (QID) | INTRAMUSCULAR | Status: DC | PRN
  Administered 2018-07-28 (×3): 12.5 mg via INTRAVENOUS
  Filled 2018-07-28 (×3): qty 1

## 2018-07-28 MED ORDER — INSULIN DETEMIR 100 UNIT/ML ~~LOC~~ SOLN
15.0000 [IU] | Freq: Two times a day (BID) | SUBCUTANEOUS | Status: DC
  Administered 2018-07-28 – 2018-07-29 (×3): 15 [IU] via SUBCUTANEOUS
  Filled 2018-07-28 (×3): qty 0.15

## 2018-07-28 MED ORDER — ONDANSETRON HCL 4 MG PO TABS: 4.0000 mg | ORAL_TABLET | Freq: Four times a day (QID) | ORAL | Status: DC | PRN

## 2018-07-28 MED ORDER — MYCOPHENOLATE MOFETIL 250 MG PO CAPS
1000.0000 mg | ORAL_CAPSULE | Freq: Two times a day (BID) | ORAL | Status: DC
Start: 2018-07-28 — End: 2018-07-29
  Administered 2018-07-28 – 2018-07-29 (×3): 1000 mg via ORAL
  Filled 2018-07-28 (×3): qty 4

## 2018-07-28 MED ORDER — PANTOPRAZOLE SODIUM 40 MG PO TBEC
40.0000 mg | DELAYED_RELEASE_TABLET | Freq: Every day | ORAL | Status: DC
  Administered 2018-07-28 – 2018-07-29 (×2): 40 mg via ORAL
  Filled 2018-07-28 (×2): qty 1

## 2018-07-28 MED ORDER — LISINOPRIL 10 MG PO TABS: 10.0000 mg | ORAL_TABLET | Freq: Every day | ORAL | Status: DC

## 2018-07-28 MED ORDER — ACETAMINOPHEN 650 MG RE SUPP: 650.0000 mg | Freq: Four times a day (QID) | RECTAL | Status: DC | PRN

## 2018-07-28 MED ORDER — LABETALOL HCL 5 MG/ML IV SOLN
10.0000 mg | Freq: Once | INTRAVENOUS | Status: AC
  Administered 2018-07-28: 10 mg via INTRAVENOUS
  Filled 2018-07-28: qty 4

## 2018-07-28 NOTE — H&P (Signed)
History and Physical    [name]:[id number] DOB: 1950/12/17 DOA: 07/27/2018  PCP: Patient, No Pcp Per  Patient coming from: Home.  History obtained from ER physician and patient's family.  Chief Complaint: Nausea vomiting.  HPI: [name] is a 67 y.o. 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  Denies any abdominal pain or diarrhea.  Has not had any chest pain or shortness of breath.  ED Course: In the ER patient had markedly elevated blood pressure CT head was unremarkable.  Chest x-ray was not showing anything acute.  EKG was showing atrial tachycardia and prolonged QTC of 577 ms.  Since patient had prolonged QTC patient was given Ativan for his nausea vomiting following which patient became sedated.  Monitor my exam patient has become more alert but still mildly lethargic.  Patient admitted for intractable nausea vomiting and uncontrolled blood pressure.  Per family patient has been compliant with his medications.  Urine drug screen is positive for marijuana.  Review of Systems: As per HPI, rest all negative.   Past Medical History:  Diagnosis Date  . Anxiety   . CPAP (continuous positive airway pressure) dependence   . Diabetes mellitus without complication (HCC)   . GERD (gastroesophageal reflux disease)   . Hypertension   . Legally blind in left eye, as defined in [place]   . PTSD (post-traumatic stress disorder)   . PVD (peripheral vascular disease) (HCC) 01/17/2015  . Renal disorder   . Sleep apnea   . Thyroid disease     Past Surgical History:  Procedure Laterality Date  . ABDOMINAL AORTAGRAM N/A 01/21/2015   Procedure: ABDOMINAL Ronny Flurry;  Surgeon: Chuck Hint, MD;  Location: [facility] CATH LAB;  Service: Cardiovascular;  Laterality: N/A;  . AMPUTATION Left 01/23/2015   Procedure:  FIRST RAY AMPUTATION/LEFT FOOT;  Surgeon: Nadara Mustard, MD;  Location: [facility] OR;  Service: Orthopedics;  Laterality: Left;  . AMPUTATION Left 02/20/2015   Procedure: AMPUTATION BELOW KNEE;  Surgeon: Nadara Mustard, MD;  Location: [facility] OR;  Service: Orthopedics;  Laterality: Left;  . AV FISTULA PLACEMENT    . COLONOSCOPY    . COMBINED KIDNEY-PANCREAS TRANSPLANT Left 2009  . INCISION AND DRAINAGE ABSCESS Left 01/13/2015   Procedure: INCISION AND DRAINAGE OF LEFT FOOT FIRST RAY ABCESS;  Surgeon: Kathryne Hitch, MD;  Location: [facility] OR;  Service: Orthopedics;  Laterality: Left;  . LOWER EXTREMITY ANGIOGRAM Left 01/21/2015   Procedure: LOWER EXTREMITY ANGIOGRAM;  Surgeon: Chuck Hint, MD;  Location: [facility] CATH LAB;  Service: Cardiovascular;  Laterality: Left;     reports that he has quit smoking. He has never used smokeless tobacco. He reports that he drinks alcohol. He reports that he does not use drugs.  No Known Allergies  Family History  Problem Relation Age of Onset  . Hypertension Other     Prior to Admission medications   Medication Sig Start Date End Date Taking? Authorizing Provider  amLODipine (NORVASC) 10 MG tablet Take 10 mg by mouth daily.    [provider]  aspirin EC 81 MG tablet Take 81 mg by mouth daily.    [provider]  atorvastatin (LIPITOR) 80 MG tablet Take 80 mg by mouth at bedtime.    [provider]  furosemide (LASIX) 20 MG tablet Take 1 tablet (20 mg total) by mouth daily. Patient not  taking: Reported on 10/18/2017 07/28/17   Osvaldo Shipper, MD  insulin detemir (LEVEMIR) 100 UNIT/ML injection Inject 0.15 mLs (15 Units total) into the skin 2 (two) times daily. 07/28/17   Osvaldo Shipper, MD  labetalol (NORMODYNE) 100 MG tablet Take 2 tablets (200 mg total) by mouth 2 (two) times daily. Patient not taking: Reported on 01/05/2018 07/28/17   Osvaldo Shipper, MD  lisinopril (PRINIVIL,ZESTRIL) 10 MG tablet Take 1 tablet (10 mg total) by  mouth daily. 01/07/18   Burnadette Pop, MD  metoCLOPramide (REGLAN) 5 MG tablet Take 1 tablet (5 mg total) by mouth 3 (three) times daily for 3 days. Patient not taking: Reported on 01/05/2018 10/20/17 01/05/26  Leatha Gilding, MD  mycophenolate (CELLCEPT) 250 MG capsule Take 1,000 mg by mouth 2 (two) times daily.    [provider]  ondansetron (ZOFRAN) 4 MG tablet Take 1 tablet (4 mg total) by mouth every 6 (six) hours as needed for nausea. 01/07/18   Burnadette Pop, MD  pantoprazole (PROTONIX) 40 MG tablet Take 1 tablet (40 mg total) by mouth daily. 07/28/17   Osvaldo Shipper, MD  potassium chloride 20 MEQ TBCR Take 20 mEq by mouth daily. 01/07/18   Burnadette Pop, MD  tacrolimus (PROGRAF) 5 MG capsule Take 5 mg by mouth 2 (two) times daily.    [provider]    Physical Exam: Vitals:   07/27/18 2245 07/28/18 0100 07/28/18 0130 07/28/18 0145  BP: (!) 197/85 (!) 208/103 (!) 192/99 (!) 202/95  Pulse: 100 (!) 102 (!) 104 (!) 103  Resp:  20 (!) 23 (!) 21  Temp:      TempSrc:      SpO2: 99% 98% 98% 99%      Constitutional: Moderately built and nourished. Vitals:   07/27/18 2245 07/28/18 0100 07/28/18 0130 07/28/18 0145  BP: (!) 197/85 (!) 208/103 (!) 192/99 (!) 202/95  Pulse: 100 (!) 102 (!) 104 (!) 103  Resp:  20 (!) 23 (!) 21  Temp:      TempSrc:      SpO2: 99% 98% 98% 99%   Eyes: Anicteric no pallor. ENMT: No discharge from the ears eyes nose or mouth. Neck: No mass felt.  No neck rigidity but no JVD appreciated. Respiratory: No rhonchi or crepitations. Cardiovascular: S1-S2 heard no murmurs appreciated. Abdomen: Soft nontender bowel sounds present. Musculoskeletal: No edema.  Left BKA. Skin: No rash. Neurologic: Mildly lethargic but becoming more alert awake moving all extremities and following commands. Psychiatric: Mildly lethargic.   Labs on Admission: I have personally reviewed following labs and imaging studies  CBC: Recent Labs  Lab  07/27/18 2343  WBC 13.0*  NEUTROABS 11.6*  HGB 16.1  HCT 51.3  MCV 83.1  PLT 167   Basic Metabolic Panel: Recent Labs  Lab 07/27/18 2343  NA 136  K 5.9*  CL 102  CO2 22  GLUCOSE 275*  BUN 15  CREATININE 0.88  CALCIUM 9.8  MG 2.1   GFR: CrCl cannot be calculated (Unknown ideal weight.). Liver Function Tests: Recent Labs  Lab 07/27/18 2343  AST 34  ALT 16  ALKPHOS 101  BILITOT 1.8*  PROT 7.6  ALBUMIN 4.9   Recent Labs  Lab 07/27/18 2343  LIPASE 24   Recent Labs  Lab 07/27/18 2344  AMMONIA 61*   Coagulation Profile: No results for input(s): INR, PROTIME in the last 168 hours. Cardiac Enzymes: No results for input(s): CKTOTAL, CKMB, CKMBINDEX, TROPONINI in the last 168 hours. BNP (last 3  results) No results for input(s): PROBNP in the last 8760 hours. HbA1C: No results for input(s): HGBA1C in the last 72 hours. CBG: Recent Labs  Lab 07/27/18 2314  GLUCAP 258*   Lipid Profile: No results for input(s): CHOL, HDL, LDLCALC, TRIG, CHOLHDL, LDLDIRECT in the last 72 hours. Thyroid Function Tests: Recent Labs    07/27/18 2344  TSH 1.347   Anemia Panel: No results for input(s): VITAMINB12, FOLATE, FERRITIN, TIBC, IRON, RETICCTPCT in the last 72 hours. Urine analysis:    Component Value Date/Time   COLORURINE STRAW (A) 07/28/2018 0100   APPEARANCEUR CLEAR 07/28/2018 0100   LABSPEC 1.016 07/28/2018 0100   PHURINE 6.0 07/28/2018 0100   GLUCOSEU >=500 (A) 07/28/2018 0100   HGBUR SMALL (A) 07/28/2018 0100   BILIRUBINUR NEGATIVE 07/28/2018 0100   KETONESUR 20 (A) 07/28/2018 0100   PROTEINUR >=300 (A) 07/28/2018 0100   UROBILINOGEN 1.0 09/29/2013 2339   NITRITE NEGATIVE 07/28/2018 0100   LEUKOCYTESUR NEGATIVE 07/28/2018 0100   Sepsis Labs: @LABRCNTIP (procalcitonin:4,lacticidven:4) )No results found for this or any previous visit (from the past 240 hour(s)).   Radiological Exams on Admission: Dg Chest 2 View  Result Date: 07/27/2018 CLINICAL  DATA:  Nausea and vomiting beginning tonight. EXAM: CHEST - 2 VIEW COMPARISON:  01/04/2018 FINDINGS: Lungs are somewhat hypoinflated without airspace consolidation or effusion. Cardiomediastinal silhouette and remainder of the exam is unchanged. IMPRESSION: No active cardiopulmonary disease. Electronically Signed   By: Elberta Fortis M.D.   On: 07/27/2018 23:15   Ct Head Wo Contrast  Result Date: 07/27/2018 CLINICAL DATA:  Hypertension and altered mental status. EXAM: CT HEAD WITHOUT CONTRAST TECHNIQUE: Contiguous axial images were obtained from the base of the skull through the vertex without intravenous contrast. COMPARISON:  09/29/2013 FINDINGS: Brain: Redemonstration of scattered microvascular ischemic disease of periventricular white matter. No large vascular territory infarct, intra-axial mass nor extra-axial fluid. No hydrocephalus. Midline fourth ventricle and basal cisterns. Intact cerebellum and brainstem. Vascular: Scattered atherosclerotic calcifications of middle cerebral arteries. No unexpected calcifications no hyperdense vessels. Skull: Intact bony calvarium. No significant calvarial soft tissue swelling. Sinuses/Orbits: Clear paranasal sinuses mastoids. Intact orbits and globes. Other: None IMPRESSION: No acute intracranial abnormality. Chronic microvascular ischemic disease. Electronically Signed   By: Tollie Eth M.D.   On: 07/27/2018 22:54    EKG: Independently reviewed.  Initial tachycardia with prolonged QTC.  LVH.  Assessment/Plan Principal Problem:   Hypertensive urgency Active Problems:   Diabetes mellitus with peripheral vascular disease (HCC)   PVD (peripheral vascular disease) (HCC)   Nausea and vomiting    1. Hypertensive urgency -in addition to patient's home medications including lisinopril and amlodipine I have placed patient on PRN IV labetalol.  Patient medication list shows patient is on labetalol but has not been taking this as to be reconfirmed.  May need to add  labetalol if patient's blood pressure remains consistently high. 2. Intractable nausea vomiting likely could be from gastroparesis or marijuana abuse.  LFTs and lipase are normal abdomen appears benign.  Vomiting is stopped at this time.   3. Prolonged QTC -avoid medications which can prolong QTC. 4. Diabetes mellitus type 1 on Levemir which will be continued along with sliding scale coverage.  For now I have placed patient on every 4 CBG checks. 5. History of kidney and pancreatic transplant on immunosuppressants CellCept and Prograf which will be continued.  Patient's family states he has been compliant.   DVT prophylaxis: SCDs until blood pressure improves. Code Status: Full code. Family Communication:  Family at the bedside. Disposition Plan: Home. Consults called: None. Admission status: Observation.   Eduard Clos MD [facility] Pager [phone].  If 7PM-7AM, please contact night-coverage [email or web address] Password [email or web address]  07/28/2018, 1:50 AM

## 2018-07-28 NOTE — Progress Notes (Signed)
Pt refusing cpap for the night. ?

## 2018-07-28 NOTE — ED Notes (Signed)
Told Dr about Zofran and he said that is all he could give him. Notified RN

## 2018-07-28 NOTE — Progress Notes (Signed)
PROGRESS NOTE                                                                                                                                                                                                             Patient Demographics:    [name], is a 67 y.o. male, DOB - 03-23-1951, ZOX:[id number]  Admit date - 07/27/2018   Admitting Physician Eduard Clos, MD  Outpatient Primary MD for the patient is Patient, No Pcp Per  LOS - 0  Chief Complaint  Patient presents with  . Emesis       Brief Narrative  - [name] is a 67 y.o. 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  In the ER work-up suggestive of hypertensive crisis urine drug screen was positive for marijuana and he was admitted for further care.   Subjective:    [name] today has, No headache, No chest pain, No abdominal pain - No Nausea, No new weakness tingling or numbness, No Cough - SOB.  Improved Nausea.   Assessment  & Plan :     1.  Hypertensive crisis.  Question compliance with blood pressure medications.  Feeling better, currently mild nausea but no emesis, oral medications adjusted and given, monitor blood pressure, if stable likely discharge tomorrow.  2.  Vomiting.  Likely due to #1 above.  Some improvement.  Trial of soft diet and monitor.  3.  Marijuana use.  Counseled to quit.  4.  Long QTC.  Resolved.  5.  Hyperkalemia.  Kayexalate and monitor, avoid nephrotoxins hold lisinopril.  6.  History of kidney and pancreatic transplant.  Continue home medications.  Follows with Dr. Camille Bal.  7.  Dyslipidemia.  On statin continue.    Family Communication  : None Present  Code Status : Full  Disposition Plan  : Home in 1 to 2 days once blood pressure is better  Consults  :  None  Procedures  :   TTE 06/2017  - Left ventricle: The cavity size was normal. Wall thickness was increased in a pattern of mild LVH. Systolic function was normal. The estimated ejection fraction was in the range of 60% to 65%. Wall motion was normal; there were no regional wall motion abnormalities. The study is not technically sufficient to allow evaluation of LV diastolic function. - Atrial septum: No defect or patent  foramen ovale was identified. - Pericardium, extracardiac: A trivial pericardial effusion was  identified posterior to the heart.   DVT Prophylaxis  :   Heparin   Lab Results  Component Value Date   PLT 132 (L) 07/28/2018    Diet :  Diet Order            DIET SOFT Room service appropriate? Yes; Fluid consistency: Thin  Diet effective now               Inpatient Medications Scheduled Meds: . amLODipine  10 mg Oral Daily  . atorvastatin  80 mg Oral QHS  . heparin injection (subcutaneous)  5,000 Units Subcutaneous Q8H  . hydrALAZINE  50 mg Oral Q8H  . insulin aspart  0-9 Units Subcutaneous TID WC  . insulin detemir  15 Units Subcutaneous BID  . labetalol  200 mg Oral BID  . mycophenolate  1,000 mg Oral BID  . pantoprazole  40 mg Oral Daily  . tacrolimus  5 mg Oral BID   Continuous Infusions: PRN Meds:.acetaminophen **OR** [DISCONTINUED] acetaminophen, hydrALAZINE, labetalol, promethazine  Antibiotics  :   Anti-infectives (From admission, onward)   None          Objective:   Vitals:   07/28/18 0740 07/28/18 0801 07/28/18 0831 07/28/18 1147  BP: (!) 205/104 (!) 207/88 (!) 128/58 (!) 150/115  Pulse: (!) 102 95 90 97  Resp:  20 (!) 22 15  Temp:  99.6 F (37.6 C)  98.6 F (37 C)  TempSrc:  Oral  Axillary  SpO2: 100% 98% 98% 99%  Weight:      Height:        Wt Readings from Last 3 Encounters:  07/28/18 73.7 kg  01/04/18 79.4 kg  10/18/17 72.6 kg     Intake/Output Summary (Last 24 hours) at 07/28/2018 1207 Last data filed at  07/28/2018 0800 Gross per 24 hour  Intake 680 ml  Output 650 ml  Net 30 ml     Physical Exam  Awake Alert, Oriented X 3, No new F.N deficits, Normal affect [place].AT,PERRAL Supple Neck,No JVD, No cervical lymphadenopathy appriciated.  Symmetrical Chest wall movement, Good air movement bilaterally, CTAB RRR,No Gallops,Rubs or new Murmurs, No Parasternal Heave +ve B.Sounds, Abd Soft, No tenderness, No organomegaly appriciated, No rebound - guarding or rigidity. No Cyanosis, Clubbing or edema, No new Rash or bruise      Data Review:    CBC Recent Labs  Lab 07/27/18 2343 07/28/18 0745  WBC 13.0* 12.5*  HGB 16.1 15.8  HCT 51.3 50.4  PLT 167 132*  MCV 83.1 81.6  MCH 26.1 25.6*  MCHC 31.4 31.3  RDW 14.9 14.7  LYMPHSABS 0.8  --   MONOABS 0.4  --   EOSABS 0.0  --   BASOSABS 0.1  --     Chemistries  Recent Labs  Lab 07/27/18 2343 07/28/18 0745  NA 136 137  K 5.9* 3.8  CL 102 103  CO2 22 22  GLUCOSE 275* 279*  BUN 15 13  CREATININE 0.88 1.01  CALCIUM 9.8 9.5  MG 2.1 1.8  AST 34  --   ALT 16  --   ALKPHOS 101  --   BILITOT 1.8*  --    ------------------------------------------------------------------------------------------------------------------ No results for input(s): CHOL, HDL, LDLCALC, TRIG, CHOLHDL, LDLDIRECT in the last 72 hours.  Lab Results  Component Value Date   HGBA1C 11.2 (H) 01/05/2018   ------------------------------------------------------------------------------------------------------------------ Recent Labs    07/27/18 2344  TSH 1.347   ------------------------------------------------------------------------------------------------------------------  No results for input(s): VITAMINB12, FOLATE, FERRITIN, TIBC, IRON, RETICCTPCT in the last 72 hours.  Coagulation profile No results for input(s): INR, PROTIME in the last 168 hours.  No results for input(s): DDIMER in the last 72 hours.  Cardiac Enzymes Recent Labs  Lab 07/28/18 0216  07/28/18 0745  TROPONINI <0.03 0.04*   ------------------------------------------------------------------------------------------------------------------    Component Value Date/Time   BNP 24.0 03/14/2015 1800    Micro Results Recent Results (from the past 240 hour(s))  MRSA PCR Screening     Status: None   Collection Time: 07/28/18  2:46 AM  Result Value Ref Range Status   MRSA by PCR NEGATIVE NEGATIVE Final    Comment:        The GeneXpert MRSA Assay (FDA approved for NASAL specimens only), is one component of a comprehensive MRSA colonization surveillance program. It is not intended to diagnose MRSA infection nor to guide or monitor treatment for MRSA infections. Performed at [facility], [address], [place], [place] [address]     Radiology Reports Dg Chest 2 View  Result Date: 07/27/2018 CLINICAL DATA:  Nausea and vomiting beginning tonight. EXAM: CHEST - 2 VIEW COMPARISON:  01/04/2018 FINDINGS: Lungs are somewhat hypoinflated without airspace consolidation or effusion. Cardiomediastinal silhouette and remainder of the exam is unchanged. IMPRESSION: No active cardiopulmonary disease. Electronically Signed   By: Elberta Fortis M.D.   On: 07/27/2018 23:15   Ct Head Wo Contrast  Result Date: 07/27/2018 CLINICAL DATA:  Hypertension and altered mental status. EXAM: CT HEAD WITHOUT CONTRAST TECHNIQUE: Contiguous axial images were obtained from the base of the skull through the vertex without intravenous contrast. COMPARISON:  09/29/2013 FINDINGS: Brain: Redemonstration of scattered microvascular ischemic disease of periventricular white matter. No large vascular territory infarct, intra-axial mass nor extra-axial fluid. No hydrocephalus. Midline fourth ventricle and basal cisterns. Intact cerebellum and brainstem. Vascular: Scattered atherosclerotic calcifications of middle cerebral arteries. No unexpected calcifications no hyperdense vessels. Skull: Intact bony  calvarium. No significant calvarial soft tissue swelling. Sinuses/Orbits: Clear paranasal sinuses mastoids. Intact orbits and globes. Other: None IMPRESSION: No acute intracranial abnormality. Chronic microvascular ischemic disease. Electronically Signed   By: Tollie Eth M.D.   On: 07/27/2018 22:54    Time Spent in minutes  30   Susa Raring M.D on 07/28/2018 at 12:07 PM  To page go to [email or web address] - password [facility]

## 2018-07-29 LAB — BASIC METABOLIC PANEL
Anion gap: 11 (ref 5–15)
BUN: 21 mg/dL (ref 8–23)
CO2: 23 mmol/L (ref 22–32)
Calcium: 9.8 mg/dL (ref 8.9–10.3)
Chloride: 101 mmol/L (ref 98–111)
Creatinine, Ser: 0.99 mg/dL (ref 0.61–1.24)
GFR calc Af Amer: >60 mL/min (ref >60)
GFR calc non Af Amer: >60 mL/min (ref >60)
Glucose, Bld: 162 mg/dL — ABNORMAL HIGH (ref 70–99)
Potassium: 3.4 mmol/L — ABNORMAL LOW (ref 3.5–5.1)
Sodium: 135 mmol/L (ref 135–145)

## 2018-07-29 LAB — URINE CULTURE

## 2018-07-29 LAB — TACROLIMUS LEVEL: Tacrolimus (FK506) - LabCorp: 2.7 ng/mL (ref 2.0–20.0)

## 2018-07-29 LAB — GLUCOSE, CAPILLARY
Glucose-Capillary: 164 mg/dL — ABNORMAL HIGH (ref 70–99)
Glucose-Capillary: 170 mg/dL — ABNORMAL HIGH (ref 70–99)

## 2018-07-29 MED ORDER — POTASSIUM CHLORIDE CRYS ER 20 MEQ PO TBCR
20.0000 meq | EXTENDED_RELEASE_TABLET | Freq: Once | ORAL | Status: DC
  Filled 2018-07-29: qty 1

## 2018-07-29 NOTE — Discharge Instructions (Signed)
Follow with Primary MD in 7 days   Get CBC, CMP, 2 view Chest X ray -  checked  by Primary MD  in 5-7 days   Activity: As tolerated with Full fall precautions use walker/cane & assistance as needed  Disposition Home    Diet:  Heart Healthy Low Carb, check CBGs QAC-HS  Special Instructions: If you have smoked or chewed Tobacco  in the last 2 yrs please stop smoking, stop any regular Alcohol  and or any Recreational drug use.  On your next visit with your primary care physician please Get Medicines reviewed and adjusted.  Please request your Prim.MD to go over all Hospital Tests and Procedure/Radiological results at the follow up, please get all Hospital records sent to your Prim MD by signing hospital release before you go home.  If you experience worsening of your admission symptoms, develop shortness of breath, life threatening emergency, suicidal or homicidal thoughts you must seek medical attention immediately by calling 911 or calling your MD immediately  if symptoms less severe.  You Must read complete instructions/literature along with all the possible adverse reactions/side effects for all the Medicines you take and that have been prescribed to you. Take any new Medicines after you have completely understood and accpet all the possible adverse reactions/side effects.

## 2018-07-29 NOTE — Discharge Summary (Signed)
[name]:[id number] DOB: 05/21/1951 DOA: 07/27/2018  PCP: Patient, No Pcp Per  Admit date: 07/27/2018  Discharge date: 07/29/2018  Admitted From: Home   Disposition:  Home   Recommendations for Outpatient Follow-up:   Follow up with PCP in 1-2 weeks  PCP Please obtain BMP/CBC, 2 view CXR in 1week,  (see Discharge instructions)   PCP Please follow up on the following pending results:    Home Health: None   Equipment/Devices: None  Consultations: None Discharge Condition: Fair   CODE STATUS: Full   Diet Recommendation: Heart Healthy Low carbohydrate    Chief Complaint  Patient presents with  . 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  In the ER work-up suggestive of hypertensive crisis urine drug screen was positive for marijuana and he was admitted for further care.                                                                  Hospital Course    1.  Hypertensive crisis.    His blood pressure medications and smoke marijuana, was treated conservatively, home medications reinstated to good effect, blood pressure stable and he is symptom-free, has been counseled on compliance with medications, will be discharged with PCP follow-up in 1 week.  2.  Vomiting.  Likely due to #1 above.  Tums have completely resolved tolerating soft diet weight commence regular diet at home.  3.  Marijuana use.  Counseled to quit.  4.  Long QTC.  Resolved.  5.  Hyperkalemia.  resolved after Kayexalate.   He had mild hypokalemia today which has been replaced.  6.  History of kidney and pancreatic transplant.  Continue home medications.  Follows with Dr. Camille Bal, requested to follow with Dr. Eliott Nine in a week.  7.  Dyslipidemia.  On statin continue.   Discharge diagnosis     Principal Problem:   Hypertensive urgency Active Problems:   Diabetes mellitus with peripheral vascular disease (HCC)   PVD (peripheral vascular disease) (HCC)   Nausea and vomiting    Discharge instructions    Discharge Instructions    Discharge instructions   Complete by:  As directed    Follow with Primary MD in 7 days   Get CBC, CMP, 2 view Chest X ray -  checked  by Primary MD  in 5-7 days   Activity: As tolerated with Full fall precautions use walker/cane & assistance as needed  Disposition Home    Diet: Heart Healthy Low Carb, check CBGs QAC-HS  Special Instructions:  If you have smoked or chewed Tobacco  in the last 2 yrs please stop smoking, stop any regular Alcohol  and or any Recreational drug use.  On your next visit with your primary care physician please Get Medicines reviewed and adjusted.  Please request your Prim.MD to go over all Hospital Tests and Procedure/Radiological results at the follow up, please get all Hospital records sent to your Prim MD by signing hospital release before you go home.  If you experience worsening of your admission symptoms, develop shortness of breath, life threatening emergency, suicidal or homicidal thoughts you must seek medical attention immediately by calling 911 or calling your MD immediately  if symptoms less severe.  You Must read complete instructions/literature along with all the possible adverse reactions/side effects for all the Medicines you take and that have been prescribed to you. Take any new Medicines after you have completely understood and accpet all the possible adverse reactions/side effects.   Increase activity slowly   Complete  by:  As directed       Discharge Medications   Allergies as of 07/29/2018   No Known Allergies     Medication List    TAKE these medications   amLODipine 10 MG tablet Commonly known as:  NORVASC Take 10 mg by mouth daily.   aspirin EC 81 MG tablet Take 81 mg by mouth daily.   atorvastatin 80 MG tablet Commonly known as:  LIPITOR Take 40 mg by mouth at bedtime. 1/2 tablet daily (40mg )   furosemide 20 MG tablet Commonly known as:  LASIX Take 1 tablet (20 mg total) by mouth daily.   insulin detemir 100 UNIT/ML injection Commonly known as:  LEVEMIR Inject 0.15 mLs (15 Units total) into the skin 2 (two) times daily.   labetalol 100 MG tablet Commonly known as:  NORMODYNE Take 2 tablets (200 mg total) by mouth 2 (two) times daily.   lisinopril 10 MG tablet Commonly known as:  PRINIVIL,ZESTRIL Take 1 tablet (10 mg total) by mouth daily. What changed:  how much to take   metoCLOPramide 5 MG tablet Commonly known as:  REGLAN Take 1 tablet (5 mg total) by mouth 3 (three) times daily for 3 days.   mycophenolate 250 MG capsule Commonly known as:  CELLCEPT Take 1,000 mg by mouth 2 (two) times daily.   ondansetron 4 MG tablet Commonly known as:  ZOFRAN Take 1 tablet (4 mg total) by mouth every 6 (six) hours as needed for nausea.   pantoprazole 40 MG tablet Commonly known as:  PROTONIX Take 1 tablet (40 mg total) by mouth daily.   Potassium Chloride ER 20 MEQ Tbcr Take 20 mEq by mouth daily.   potassium chloride SA 20 MEQ tablet Commonly known as:  K-DUR,KLOR-CON Take 20 mEq by mouth daily.   tacrolimus 5 MG capsule Commonly known as:  PROGRAF Take 5 mg by mouth 2 (two) times daily.       Follow-up Information    PCP. Schedule an appointment as soon as possible for a visit in 1 week(s).   Why:  and your Kidney doctor in 1 week          Major procedures and Radiology Reports - PLEASE review detailed and final reports thoroughly  -        Dg Chest 2  View  Result Date: 07/27/2018 CLINICAL DATA:  Nausea and vomiting beginning tonight. EXAM: CHEST - 2 VIEW COMPARISON:  01/04/2018 FINDINGS: Lungs are somewhat hypoinflated without airspace consolidation or  effusion. Cardiomediastinal silhouette and remainder of the exam is unchanged. IMPRESSION: No active cardiopulmonary disease. Electronically Signed   By: Elberta Fortis M.D.   On: 07/27/2018 23:15   Ct Head Wo Contrast  Result Date: 07/27/2018 CLINICAL DATA:  Hypertension and altered mental status. EXAM: CT HEAD WITHOUT CONTRAST TECHNIQUE: Contiguous axial images were obtained from the base of the skull through the vertex without intravenous contrast. COMPARISON:  09/29/2013 FINDINGS: Brain: Redemonstration of scattered microvascular ischemic disease of periventricular white matter. No large vascular territory infarct, intra-axial mass nor extra-axial fluid. No hydrocephalus. Midline fourth ventricle and basal cisterns. Intact cerebellum and brainstem. Vascular: Scattered atherosclerotic calcifications of middle cerebral arteries. No unexpected calcifications no hyperdense vessels. Skull: Intact bony calvarium. No significant calvarial soft tissue swelling. Sinuses/Orbits: Clear paranasal sinuses mastoids. Intact orbits and globes. Other: None IMPRESSION: No acute intracranial abnormality. Chronic microvascular ischemic disease. Electronically Signed   By: Tollie Eth M.D.   On: 07/27/2018 22:54    Micro Results    Recent Results (from the past 240 hour(s))  MRSA PCR Screening     Status: None   Collection Time: 07/28/18  2:46 AM  Result Value Ref Range Status   MRSA by PCR NEGATIVE NEGATIVE Final    Comment:        The GeneXpert MRSA Assay (FDA approved for NASAL specimens only), is one component of a comprehensive MRSA colonization surveillance program. It is not intended to diagnose MRSA infection nor to guide or monitor treatment for MRSA infections. Performed at [facility]  Lab, [address], [place], [place] [address]     Today   Subjective    [name] today has no headache,no chest abdominal pain,no new weakness tingling or numbness, feels much better wants to go home today.    Objective   Blood pressure 130/65, pulse 75, temperature 98.8 F (37.1 C), temperature source Oral, resp. rate (!) 22, height 6' (1.829 m), weight 73.7 kg, SpO2 99 %.   Intake/Output Summary (Last 24 hours) at 07/29/2018 0818 Last data filed at 07/29/2018 0300 Gross per 24 hour  Intake 240 ml  Output 350 ml  Net -110 ml    Exam Awake Alert, Oriented x 3, No new F.N deficits, Normal affect [place].AT,PERRAL Supple Neck,No JVD, No cervical lymphadenopathy appriciated.  Symmetrical Chest wall movement, Good air movement bilaterally, CTAB RRR,No Gallops,Rubs or new Murmurs, No Parasternal Heave +ve B.Sounds, Abd Soft, Non tender, No organomegaly appriciated, No rebound -guarding or rigidity. No Cyanosis, Clubbing or edema, No new Rash or bruise   Data Review   CBC w Diff:  Lab Results  Component Value Date   WBC 12.5 (H) 07/28/2018   HGB 15.8 07/28/2018   HCT 50.4 07/28/2018   PLT 132 (L) 07/28/2018   LYMPHOPCT 6 07/27/2018   MONOPCT 3 07/27/2018   EOSPCT 0 07/27/2018   BASOPCT 1 07/27/2018    CMP:  Lab Results  Component Value Date   NA 135 07/29/2018   K 3.4 (L) 07/29/2018   CL 101 07/29/2018   CO2 23 07/29/2018   BUN 21 07/29/2018   CREATININE 0.99 07/29/2018   PROT 7.6 07/27/2018   ALBUMIN 4.9 07/27/2018   BILITOT 1.8 (H) 07/27/2018   ALKPHOS 101 07/27/2018   AST 34 07/27/2018   ALT 16 07/27/2018  .   Total Time in preparing paper work, data evaluation and todays exam - 35 minutes  Susa Raring M.D on 07/29/2018 at 8:18 AM  [facility]  Office  [phone]

## 2018-07-29 NOTE — Progress Notes (Signed)
Discharged home  by wheelchair accompanied by friend, discharge instructions given to pt. Belongings taken home. 

## 2018-08-02 LAB — CULTURE, BLOOD (ROUTINE X 2)
Culture: NO GROWTH
Culture: NO GROWTH

## 2018-08-03 ENCOUNTER — Ambulatory Visit: Payer: Non-veteran care | Admitting: Podiatry

## 2018-09-12 ENCOUNTER — Encounter (HOSPITAL_COMMUNITY): Payer: Self-pay | Admitting: Emergency Medicine

## 2018-09-12 ENCOUNTER — Inpatient Hospital Stay (HOSPITAL_COMMUNITY)
Admission: EM | Admit: 2018-09-12 | Discharge: 2018-09-14 | DRG: 074 | Disposition: A | Discharge status: Home / Self Care | Payer: Medicare Other | Attending: Internal Medicine | Admitting: Internal Medicine

## 2018-09-12 ENCOUNTER — Emergency Department (HOSPITAL_COMMUNITY): Payer: Medicare Other

## 2018-09-12 DIAGNOSIS — E1151 Type 2 diabetes mellitus with diabetic peripheral angiopathy without gangrene: Secondary | ICD-10-CM | POA: Diagnosis present

## 2018-09-12 DIAGNOSIS — E1043 Type 1 diabetes mellitus with diabetic autonomic (poly)neuropathy: Principal | ICD-10-CM | POA: Diagnosis present

## 2018-09-12 DIAGNOSIS — Z794 Long term (current) use of insulin: Secondary | ICD-10-CM

## 2018-09-12 DIAGNOSIS — I10 Essential (primary) hypertension: Secondary | ICD-10-CM | POA: Diagnosis present

## 2018-09-12 DIAGNOSIS — Z8249 Family history of ischemic heart disease and other diseases of the circulatory system: Secondary | ICD-10-CM

## 2018-09-12 DIAGNOSIS — H548 Legal blindness, as defined in USA: Secondary | ICD-10-CM | POA: Diagnosis present

## 2018-09-12 DIAGNOSIS — Z87891 Personal history of nicotine dependence: Secondary | ICD-10-CM

## 2018-09-12 DIAGNOSIS — Z9989 Dependence on other enabling machines and devices: Secondary | ICD-10-CM

## 2018-09-12 DIAGNOSIS — I160 Hypertensive urgency: Secondary | ICD-10-CM | POA: Diagnosis present

## 2018-09-12 DIAGNOSIS — I739 Peripheral vascular disease, unspecified: Secondary | ICD-10-CM | POA: Diagnosis present

## 2018-09-12 DIAGNOSIS — E1165 Type 2 diabetes mellitus with hyperglycemia: Secondary | ICD-10-CM | POA: Diagnosis not present

## 2018-09-12 DIAGNOSIS — Z79899 Other long term (current) drug therapy: Secondary | ICD-10-CM

## 2018-09-12 DIAGNOSIS — Z940 Kidney transplant status: Secondary | ICD-10-CM

## 2018-09-12 DIAGNOSIS — F419 Anxiety disorder, unspecified: Secondary | ICD-10-CM | POA: Diagnosis present

## 2018-09-12 DIAGNOSIS — R112 Nausea with vomiting, unspecified: Secondary | ICD-10-CM | POA: Diagnosis not present

## 2018-09-12 DIAGNOSIS — Z7982 Long term (current) use of aspirin: Secondary | ICD-10-CM

## 2018-09-12 DIAGNOSIS — F1290 Cannabis use, unspecified, uncomplicated: Secondary | ICD-10-CM | POA: Diagnosis present

## 2018-09-12 DIAGNOSIS — R000 Tachycardia, unspecified: Secondary | ICD-10-CM | POA: Diagnosis not present

## 2018-09-12 DIAGNOSIS — Z89512 Acquired absence of left leg below knee: Secondary | ICD-10-CM

## 2018-09-12 DIAGNOSIS — Z9483 Pancreas transplant status: Secondary | ICD-10-CM

## 2018-09-12 DIAGNOSIS — K219 Gastro-esophageal reflux disease without esophagitis: Secondary | ICD-10-CM | POA: Diagnosis present

## 2018-09-12 DIAGNOSIS — G4733 Obstructive sleep apnea (adult) (pediatric): Secondary | ICD-10-CM | POA: Diagnosis present

## 2018-09-12 DIAGNOSIS — E1051 Type 1 diabetes mellitus with diabetic peripheral angiopathy without gangrene: Secondary | ICD-10-CM | POA: Diagnosis present

## 2018-09-12 DIAGNOSIS — F4310 Post-traumatic stress disorder, unspecified: Secondary | ICD-10-CM | POA: Diagnosis present

## 2018-09-12 DIAGNOSIS — K3184 Gastroparesis: Secondary | ICD-10-CM | POA: Diagnosis present

## 2018-09-12 DIAGNOSIS — R110 Nausea: Secondary | ICD-10-CM | POA: Diagnosis not present

## 2018-09-12 DIAGNOSIS — E785 Hyperlipidemia, unspecified: Secondary | ICD-10-CM | POA: Diagnosis present

## 2018-09-12 DIAGNOSIS — R1110 Vomiting, unspecified: Secondary | ICD-10-CM | POA: Diagnosis not present

## 2018-09-12 DIAGNOSIS — R61 Generalized hyperhidrosis: Secondary | ICD-10-CM | POA: Diagnosis not present

## 2018-09-12 DIAGNOSIS — Z8673 Personal history of transient ischemic attack (TIA), and cerebral infarction without residual deficits: Secondary | ICD-10-CM

## 2018-09-12 LAB — CBC
HCT: 53.4 % — ABNORMAL HIGH (ref 39.0–52.0)
Hemoglobin: 17 g/dL (ref 13.0–17.0)
MCH: 26.6 pg (ref 26.0–34.0)
MCHC: 31.8 g/dL (ref 30.0–36.0)
MCV: 83.6 fL (ref 80.0–100.0)
Platelets: 165 10*3/uL (ref 150–400)
RBC: 6.39 MIL/uL — ABNORMAL HIGH (ref 4.22–5.81)
RDW: 14.7 % (ref 11.5–15.5)
WBC: 18.4 10*3/uL — ABNORMAL HIGH (ref 4.0–10.5)
nRBC: 0 % (ref 0.0–0.2)

## 2018-09-12 LAB — COMPREHENSIVE METABOLIC PANEL
ALT: 14 U/L (ref 0–44)
AST: 14 U/L — ABNORMAL LOW (ref 15–41)
Albumin: 5.1 g/dL — ABNORMAL HIGH (ref 3.5–5.0)
Alkaline Phosphatase: 87 U/L (ref 38–126)
Anion gap: 14 (ref 5–15)
BUN: 22 mg/dL (ref 8–23)
CO2: 24 mmol/L (ref 22–32)
Calcium: 10 mg/dL (ref 8.9–10.3)
Chloride: 96 mmol/L — ABNORMAL LOW (ref 98–111)
Creatinine, Ser: 0.93 mg/dL (ref 0.61–1.24)
GFR calc Af Amer: >60 mL/min (ref >60)
GFR calc non Af Amer: >60 mL/min (ref >60)
Glucose, Bld: 281 mg/dL — ABNORMAL HIGH (ref 70–99)
Potassium: 4 mmol/L (ref 3.5–5.1)
Sodium: 134 mmol/L — ABNORMAL LOW (ref 135–145)
Total Bilirubin: 1.2 mg/dL (ref 0.3–1.2)
Total Protein: 7.7 g/dL (ref 6.5–8.1)

## 2018-09-12 LAB — LIPASE, BLOOD: Lipase: 37 U/L (ref 11–51)

## 2018-09-12 LAB — URINALYSIS, ROUTINE W REFLEX MICROSCOPIC
Bacteria, UA: NONE SEEN
Bilirubin Urine: NEGATIVE
Glucose, UA: >=500 mg/dL — AB
Ketones, ur: 20 mg/dL — AB
Leukocytes, UA: NEGATIVE
Nitrite: NEGATIVE
Protein, ur: >=300 mg/dL — AB
Specific Gravity, Urine: 1.036 — ABNORMAL HIGH (ref 1.005–1.030)
pH: 6 (ref 5.0–8.0)

## 2018-09-12 LAB — I-STAT TROPONIN, ED: Troponin i, poc: 0.04 ng/mL (ref 0.00–0.08)

## 2018-09-12 MED ORDER — IOPAMIDOL (ISOVUE-300) INJECTION 61%
75.0000 mL | Freq: Once | INTRAVENOUS | Status: AC | PRN
  Administered 2018-09-12: 75 mL via INTRAVENOUS

## 2018-09-12 MED ORDER — LACTATED RINGERS IV BOLUS
1000.0000 mL | Freq: Once | INTRAVENOUS | Status: AC
  Administered 2018-09-12: 1000 mL via INTRAVENOUS

## 2018-09-12 MED ORDER — METOCLOPRAMIDE HCL 5 MG/ML IJ SOLN
5.0000 mg | Freq: Three times a day (TID) | INTRAMUSCULAR | Status: DC
  Administered 2018-09-13 (×3): 5 mg via INTRAVENOUS
  Filled 2018-09-12: qty 1
  Filled 2018-09-12: qty 2
  Filled 2018-09-12: qty 1
  Filled 2018-09-12 (×4): qty 2

## 2018-09-12 MED ORDER — MYCOPHENOLATE MOFETIL 250 MG PO CAPS
1000.0000 mg | ORAL_CAPSULE | Freq: Two times a day (BID) | ORAL | Status: DC
  Administered 2018-09-13 – 2018-09-14 (×4): 1000 mg via ORAL
  Filled 2018-09-12 (×4): qty 4

## 2018-09-12 MED ORDER — LISINOPRIL 20 MG PO TABS
20.0000 mg | ORAL_TABLET | Freq: Every day | ORAL | Status: DC
  Administered 2018-09-13 – 2018-09-14 (×2): 20 mg via ORAL
  Filled 2018-09-12 (×2): qty 1

## 2018-09-12 MED ORDER — ENOXAPARIN SODIUM 40 MG/0.4ML ~~LOC~~ SOLN
40.0000 mg | Freq: Every day | SUBCUTANEOUS | Status: DC
  Administered 2018-09-13 (×2): 40 mg via SUBCUTANEOUS
  Filled 2018-09-12 (×2): qty 0.4

## 2018-09-12 MED ORDER — ONDANSETRON HCL 4 MG/2ML IJ SOLN
4.0000 mg | Freq: Once | INTRAMUSCULAR | Status: AC
  Administered 2018-09-12: 4 mg via INTRAVENOUS
  Filled 2018-09-12: qty 2

## 2018-09-12 MED ORDER — TACROLIMUS 1 MG PO CAPS
5.0000 mg | ORAL_CAPSULE | Freq: Two times a day (BID) | ORAL | Status: DC
  Administered 2018-09-13 – 2018-09-14 (×4): 5 mg via ORAL
  Filled 2018-09-12 (×4): qty 5

## 2018-09-12 MED ORDER — ONDANSETRON HCL 4 MG/2ML IJ SOLN
4.0000 mg | Freq: Four times a day (QID) | INTRAMUSCULAR | Status: DC | PRN
  Administered 2018-09-13: 4 mg via INTRAVENOUS
  Filled 2018-09-12: qty 2

## 2018-09-12 MED ORDER — ONDANSETRON HCL 4 MG PO TABS: 4.0000 mg | ORAL_TABLET | Freq: Four times a day (QID) | ORAL | Status: DC | PRN

## 2018-09-12 MED ORDER — ONDANSETRON 4 MG PO TBDP: 4.0000 mg | ORAL_TABLET | Freq: Once | ORAL | Status: DC | PRN

## 2018-09-12 MED ORDER — LABETALOL HCL 5 MG/ML IV SOLN
10.0000 mg | INTRAVENOUS | Status: DC | PRN
  Administered 2018-09-13 (×2): 10 mg via INTRAVENOUS
  Filled 2018-09-12 (×3): qty 4

## 2018-09-12 MED ORDER — PROMETHAZINE HCL 25 MG/ML IJ SOLN
12.5000 mg | Freq: Once | INTRAMUSCULAR | Status: AC
  Administered 2018-09-12: 12.5 mg via INTRAVENOUS
  Filled 2018-09-12: qty 1

## 2018-09-12 MED ORDER — AMLODIPINE BESYLATE 10 MG PO TABS
10.0000 mg | ORAL_TABLET | Freq: Every day | ORAL | Status: DC
  Administered 2018-09-13 – 2018-09-14 (×2): 10 mg via ORAL
  Filled 2018-09-12: qty 1
  Filled 2018-09-12: qty 2

## 2018-09-12 MED ORDER — POTASSIUM CHLORIDE IN NACL 20-0.9 MEQ/L-% IV SOLN
INTRAVENOUS | Status: AC
  Administered 2018-09-13: 04:00:00 via INTRAVENOUS
  Filled 2018-09-12: qty 1000

## 2018-09-12 MED ORDER — ATORVASTATIN CALCIUM 40 MG PO TABS
40.0000 mg | ORAL_TABLET | Freq: Every day | ORAL | Status: DC
  Administered 2018-09-13 (×2): 40 mg via ORAL
  Filled 2018-09-12 (×2): qty 1

## 2018-09-12 MED ORDER — ASPIRIN EC 81 MG PO TBEC
81.0000 mg | DELAYED_RELEASE_TABLET | Freq: Every day | ORAL | Status: DC
  Administered 2018-09-13 – 2018-09-14 (×2): 81 mg via ORAL
  Filled 2018-09-12 (×2): qty 1

## 2018-09-12 NOTE — ED Notes (Signed)
Patient began dry heaving. EDP notified.

## 2018-09-12 NOTE — ED Triage Notes (Signed)
Per [facility] pt from home for n/v x 6 days. Reports due to n/v unable to keep his medications down x 2 days. 173/98, sinus tach. CBG 320.

## 2018-09-12 NOTE — H&P (Signed)
History and Physical    [name] EAV:[id number] DOB: 09/21/1951 DOA: 09/12/2018  PCP: Patient, No Pcp Per  Patient coming from: Home  I have personally briefly reviewed patient's old medical records in [facility]  Chief Complaint: Persistent nausea vomiting  HPI: [name] is a 67 y.o. 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  Symptoms developed early morning on 09/10/2018 when he says he took his medications on an empty stomach.  Since then he has had frequent clear emesis and has been unable to tolerate oral intake including his home medications.  He has had associated diaphoresis.  He reports occasional marijuana use with last use a few days ago.  He denies any fevers, chest pain, dyspnea, abdominal pain, dysuria.  ED Course:  Initial vitals show BP 173/93, pulse 128, RR 17, temp 98.4 Fahrenheit, SPO2 100% on room air.  Letter notable for WBC 18.4, hemoglobin 17, platelets 165, BUN 22, creatinine 0.93, glucose 281, AST 14, ALT 14, alkaline phosphatase 87, total bilirubin 1.2.  Lipase 37.  Urinalysis is negative for UTI.  UDS is positive for THC. CT abdomen/pelvis with contrast was obtained which was negative for acute abnormality.  He was given 2 L IV LR and IV Zofran with persistent nausea and vomiting.  Hospitalist service was consulted for further management.  Review of Systems: As per HPI otherwise 10 point review of systems negative.    Past Medical History:  Diagnosis Date  . Anxiety   . CPAP (continuous positive airway pressure) dependence   . Diabetes mellitus without complication (HCC)   . GERD (gastroesophageal reflux disease)   . Hypertension   . Legally blind in left eye, as defined in [place]   . PTSD (post-traumatic stress disorder)   . PVD  (peripheral vascular disease) (HCC) 01/17/2015  . Renal disorder   . Sleep apnea   . Thyroid disease     Past Surgical History:  Procedure Laterality Date  . ABDOMINAL AORTAGRAM N/A 01/21/2015   Procedure: ABDOMINAL Ronny Flurry;  Surgeon: Chuck Hint, MD;  Location: [facility] CATH LAB;  Service: Cardiovascular;  Laterality: N/A;  . AMPUTATION Left 01/23/2015   Procedure: FIRST RAY AMPUTATION/LEFT FOOT;  Surgeon: Nadara Mustard, MD;  Location: [facility] OR;  Service: Orthopedics;  Laterality: Left;  . AMPUTATION Left 02/20/2015   Procedure: AMPUTATION BELOW KNEE;  Surgeon: Nadara Mustard, MD;  Location: [facility] OR;  Service: Orthopedics;  Laterality: Left;  . AV FISTULA PLACEMENT    . COLONOSCOPY    . COMBINED KIDNEY-PANCREAS TRANSPLANT Left 2009  . INCISION AND DRAINAGE ABSCESS Left 01/13/2015   Procedure: INCISION AND DRAINAGE OF LEFT FOOT FIRST RAY ABCESS;  Surgeon: Kathryne Hitch, MD;  Location: [facility] OR;  Service: Orthopedics;  Laterality: Left;  . LOWER EXTREMITY ANGIOGRAM Left 01/21/2015   Procedure: LOWER EXTREMITY ANGIOGRAM;  Surgeon: Chuck Hint, MD;  Location: [facility] CATH LAB;  Service: Cardiovascular;  Laterality: Left;     reports that he has quit smoking. He has never used smokeless tobacco. He reports current alcohol use. He reports that he does not use drugs.  No Known Allergies  Family History  Problem Relation Age of Onset  . Hypertension Other      Prior to Admission medications   Medication Sig Start Date End Date Taking? Authorizing  Provider  amLODipine (NORVASC) 10 MG tablet Take 10 mg by mouth daily.   Yes [provider]  aspirin EC 81 MG tablet Take 81 mg by mouth daily.   Yes [provider]  atorvastatin (LIPITOR) 80 MG tablet Take 40 mg by mouth at bedtime. 1/2 tablet daily (40mg )   Yes [provider]  furosemide (LASIX) 20 MG tablet Take 1 tablet (20 mg total) by mouth daily. 07/28/17  Yes Osvaldo Shipper, MD  insulin detemir  (LEVEMIR) 100 UNIT/ML injection Inject 0.15 mLs (15 Units total) into the skin 2 (two) times daily. Patient taking differently: Inject 20 Units into the skin daily.  07/28/17  Yes Osvaldo Shipper, MD  lisinopril (PRINIVIL,ZESTRIL) 10 MG tablet Take 1 tablet (10 mg total) by mouth daily. Patient taking differently: Take 20 mg by mouth daily.  01/07/18  Yes Burnadette Pop, MD  mycophenolate (CELLCEPT) 250 MG capsule Take 1,000 mg by mouth 2 (two) times daily.   Yes [provider]  ondansetron (ZOFRAN) 4 MG tablet Take 1 tablet (4 mg total) by mouth every 6 (six) hours as needed for nausea. 01/07/18  Yes Burnadette Pop, MD  potassium chloride 20 MEQ TBCR Take 20 mEq by mouth daily. 01/07/18  Yes Burnadette Pop, MD  tacrolimus (PROGRAF) 5 MG capsule Take 5 mg by mouth 2 (two) times daily.   Yes [provider]  labetalol (NORMODYNE) 100 MG tablet Take 2 tablets (200 mg total) by mouth 2 (two) times daily. Patient not taking: Reported on 01/05/2018 07/28/17   Osvaldo Shipper, MD  metoCLOPramide (REGLAN) 5 MG tablet Take 1 tablet (5 mg total) by mouth 3 (three) times daily for 3 days. Patient not taking: Reported on 01/05/2018 10/20/17 01/05/26  Leatha Gilding, MD  pantoprazole (PROTONIX) 40 MG tablet Take 1 tablet (40 mg total) by mouth daily. Patient not taking: Reported on 09/12/2018 07/28/17   Osvaldo Shipper, MD    Physical Exam: Vitals:   09/12/18 1952 09/12/18 2000 09/12/18 2317 09/12/18 2330  BP: (!) 210/91 (!) 204/90 (!) 211/101 (!) 192/93  Pulse: (!) 126 (!) 125 (!) 132 (!) 124  Resp: (!) 22 (!) 22 (!) 23 18  Temp:      TempSrc:      SpO2: 99% 99% 97% 97%    Constitutional: Diaphoretic, frequent episodes of small amounts of clear emesis Eyes: PERRL, lids and conjunctivae normal ENMT: Mucous membranes are moist. Posterior pharynx clear of any exudate or lesions.Normal dentition.  Neck: normal, supple, no masses. Respiratory: clear to auscultation bilaterally, no  wheezing, no crackles. Normal respiratory effort. No accessory muscle use.  Cardiovascular: Regular rate and rhythm, systolic murmur. No extremity edema. Abdomen: no tenderness, no masses palpated. No hepatosplenomegaly. Bowel sounds positive.  Musculoskeletal: s/p left BKA, good ROM, no contractures. Normal muscle tone.  Skin: no rashes, lesions, ulcers. No induration Neurologic: CN 2-12 grossly intact. Sensation intact, DTR normal. Strength 5/5 in all 4.  Psychiatric: Normal judgment and insight. Alert and oriented x 3. Normal mood.    Labs on Admission: I have personally reviewed following labs and imaging studies  CBC: Recent Labs  Lab 09/12/18 1617  WBC 18.4*  HGB 17.0  HCT 53.4*  MCV 83.6  PLT 165   Basic Metabolic Panel: Recent Labs  Lab 09/12/18 1617  NA 134*  K 4.0  CL 96*  CO2 24  GLUCOSE 281*  BUN 22  CREATININE 0.93  CALCIUM 10.0   GFR: CrCl cannot be calculated (Unknown ideal weight.).  Liver Function Tests: Recent Labs  Lab 09/12/18 1617  AST 14*  ALT 14  ALKPHOS 87  BILITOT 1.2  PROT 7.7  ALBUMIN 5.1*   Recent Labs  Lab 09/12/18 1617  LIPASE 37   No results for input(s): AMMONIA in the last 168 hours. Coagulation Profile: No results for input(s): INR, PROTIME in the last 168 hours. Cardiac Enzymes: No results for input(s): CKTOTAL, CKMB, CKMBINDEX, TROPONINI in the last 168 hours. BNP (last 3 results) No results for input(s): PROBNP in the last 8760 hours. HbA1C: No results for input(s): HGBA1C in the last 72 hours. CBG: No results for input(s): GLUCAP in the last 168 hours. Lipid Profile: No results for input(s): CHOL, HDL, LDLCALC, TRIG, CHOLHDL, LDLDIRECT in the last 72 hours. Thyroid Function Tests: No results for input(s): TSH, T4TOTAL, FREET4, T3FREE, THYROIDAB in the last 72 hours. Anemia Panel: No results for input(s): VITAMINB12, FOLATE, FERRITIN, TIBC, IRON, RETICCTPCT in the last 72 hours. Urine analysis:    Component  Value Date/Time   COLORURINE YELLOW 09/12/2018 2118   APPEARANCEUR CLEAR 09/12/2018 2118   LABSPEC 1.036 (H) 09/12/2018 2118   PHURINE 6.0 09/12/2018 2118   GLUCOSEU >=500 (A) 09/12/2018 2118   HGBUR SMALL (A) 09/12/2018 2118   BILIRUBINUR NEGATIVE 09/12/2018 2118   KETONESUR 20 (A) 09/12/2018 2118   PROTEINUR >=300 (A) 09/12/2018 2118   UROBILINOGEN 1.0 09/29/2013 2339   NITRITE NEGATIVE 09/12/2018 2118   LEUKOCYTESUR NEGATIVE 09/12/2018 2118    Radiological Exams on Admission: Ct Abdomen Pelvis W Contrast  Result Date: 09/12/2018 CLINICAL DATA:  Nausea and vomiting for the past 5-6 days. Previous kidney transplant. EXAM: CT ABDOMEN AND PELVIS WITH CONTRAST TECHNIQUE: Multidetector CT imaging of the abdomen and pelvis was performed using the standard protocol following bolus administration of intravenous contrast. CONTRAST:  75mL ISOVUE-300 IOPAMIDOL (ISOVUE-300) INJECTION 61% COMPARISON:  10/20/2017, 02/01/2016 and 04/03/2010. FINDINGS: Lower chest: 4 mm left lower lobe nodule on image number 1 series 4, not included on the available previous examinations. Hepatobiliary: No focal liver abnormality is seen. No gallstones, gallbladder wall thickening, or biliary dilatation. Pancreas: Unremarkable. No pancreatic ductal dilatation or surrounding inflammatory changes. Spleen: Normal in size without focal abnormality. Adrenals/Urinary Tract: Normal appearing adrenal glands. Stable atrophied native kidneys. Stable hypertrophied left pelvic transplant kidney. There has been no significant change in a small wedge-shaped area of low density extending through the cortex of the upper pole of the left kidney medially and a thin similar area in the lower pole of the transplant kidney inferiorly compared to 10/20/2017. Again demonstrated is normal excretion of contrast by the left kidney with a normal appearing renal collecting system and ureter. Unremarkable urinary bladder. Stomach/Bowel: Unremarkable  stomach, small bowel and colon. No evidence of appendicitis. Vascular/Lymphatic: Atheromatous arterial calcifications without aneurysm. No enlarged lymph nodes. Reproductive: Prostate is unremarkable. Other: Midline surgical scar. Right mid abdominal surgical staples/clips. Musculoskeletal: Lumbar spine degenerative changes and mild lower thoracic spine degenerative changes. IMPRESSION: 1. No acute abnormality. 2. Stable left pelvic transplant kidney with stable probable small cortical scars as described above. 3. 4 mm left lower lobe nodule, not previously included. No follow-up needed if patient is low-risk. Non-contrast chest CT can be considered in 12 months if patient is high-risk. This recommendation follows the consensus statement: Guidelines for Management of Incidental Pulmonary Nodules Detected on CT Images: From the Fleischner Society 2017; Radiology 2017; 284:228-243. Electronically Signed   By: Beckie Salts M.D.   On: 09/12/2018 21:05    EKG:  Independently reviewed. Sinus tachycardia with LVH.  Assessment/Plan Principal Problem:   Intractable nausea and vomiting Active Problems:   Diabetes mellitus with peripheral vascular disease (HCC)   Essential hypertension   PVD (peripheral vascular disease) (HCC)   Hypertensive urgency   Kidney transplant recipient   OSA on CPAP   [name] is a 67 y.o. 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  Intractable nausea and vomiting: Suspect secondary to cannabinoid hyperemesis syndrome from gastroparesis.  Viral gastroenteritis is also a possibility given his leukocytosis. -Continue supportive care with antiemetics Zofran/Reglan -IV fluids overnight -Give n.p.o. for now  Hypertensive urgency: Secondary to inability to take home oral  medications.  This is likely also contribute to his presenting symptoms. -Restart home amlodipine, lisinopril, labetalol -IV labetalol as needed  S/p kidney and pancreatic transplant: Renal function is stable. -Continue home CellCept and Prograf  Insulin-dependent diabetes with PVD s/p L BKA: Hyperglycemic despite poor oral intake. -Restart home Levemir 20 units nightly -SSI  OSA on CPAP: CPAP nightly as tolerated   DVT prophylaxis: Lovenox Code Status: Full code Family Communication: Not present on admission Disposition Plan: Pending ability to maintain adequate oral intake Consults called: None Admission status: Observation   [name] McleanVishal Douglass Dunshee MD [facility] Pager [phone]  If 7PM-7AM, please contact night-coverage [email or web address] Password [email or web address]  09/13/2018, 1:35 AM

## 2018-09-12 NOTE — ED Provider Notes (Signed)
[facility]-EMERGENCY DEPT Provider Note   CSN: [id number] Arrival date & time: 09/12/18  1521     History   Chief Complaint Chief Complaint  Patient presents with  . Emesis  . Nausea    HPI [name] is a 67 y.o. male.  HPI   67yM with n/v. Ongoing for several days. Says not keeping meds down. No fever. No pain. No diarrhea. Hx o frenal transplant. No acute urinary complaints.   Past Medical History:  Diagnosis Date  . Anxiety   . CPAP (continuous positive airway pressure) dependence   . Diabetes mellitus without complication (HCC)   . GERD (gastroesophageal reflux disease)   . Hypertension   . Legally blind in left eye, as defined in [place]   . PTSD (post-traumatic stress disorder)   . PVD (peripheral vascular disease) (HCC) 01/17/2015  . Renal disorder   . Sleep apnea   . Thyroid disease     Patient Active Problem List   Diagnosis Date Noted  . DKA, type 1 (HCC) 01/05/2018  . Nausea and vomiting 10/19/2017  . Nausea & vomiting 10/18/2017  . Hematemesis 07/24/2017  . Hypertensive urgency 07/24/2017  . Acute GI bleeding 07/24/2017  . 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 S/P BKA (below knee amputation) unilateral (HCC) 02/20/2015  . Ischemic ulcer of toe (HCC)   . Peripheral vascular disease (HCC)   . PVD (peripheral vascular disease) (HCC) 01/17/2015  . Cellulitis and abscess of foot 01/13/2015  . Cellulitis and abscess of foot excluding toe 01/13/2015  . Diabetes mellitus without complication (HCC) 01/13/2015  . Leukocytosis 01/13/2015  . Diabetes mellitus with peripheral vascular disease (HCC) 12/19/2007  . DYSLIPIDEMIA 12/19/2007  . ANEMIA 12/19/2007  . ANXIETY 12/19/2007  . Essential hypertension 12/19/2007  . TRANSIENT ISCHEMIC ATTACK 12/19/2007  . PERIPHERAL VASCULAR DISEASE 12/19/2007  . RENAL FAILURE 12/19/2007    Past Surgical History:  Procedure Laterality Date  . ABDOMINAL  AORTAGRAM N/A 01/21/2015   Procedure: ABDOMINAL Ronny Flurry;  Surgeon: Chuck Hint, MD;  Location[facility] CATH LAB;  Service: Cardiovascular;  Laterality: N/A;  . AMPUTATION Left 01/23/2015   Procedure: FIRST RAY AMPUTATION/LEFT FOOT;  Surgeon: Nadara Mustard, MD;  Location: [facility] OR;  Service: Orthopedics;  Laterality: Left;  . AMPUTATION Left 02/20/2015   Procedure: AMPUTATION BELOW KNEE;  Surgeon: Nadara Mustard, MD;  Location: [facility] OR;  Service: Orthopedics;  Laterality: Left;  . AV FISTULA PLACEMENT    . COLONOSCOPY    . COMBINED KIDNEY-PANCREAS TRANSPLANT Left 2009  . INCISION AND DRAINAGE ABSCESS Left 01/13/2015   Procedure: INCISION AND DRAINAGE OF LEFT FOOT FIRST RAY ABCESS;  Surgeon: Kathryne Hitch, MD;  Location: [facility] OR;  Service: Orthopedics;  Laterality: Left;  . LOWER EXTREMITY ANGIOGRAM Left 01/21/2015   Procedure: LOWER EXTREMITY ANGIOGRAM;  Surgeon: Chuck Hint, MD;  Location: [facility] CATH LAB;  Service: Cardiovascular;  Laterality: Left;        Home Medications    Prior to Admission medications   Medication Sig Start Date End Date Taking? Authorizing Provider  amLODipine (NORVASC) 10 MG tablet Take 10 mg by mouth daily.    [provider]  aspirin EC 81 MG tablet Take 81 mg by mouth daily.    [provider]  atorvastatin (LIPITOR) 80 MG tablet Take 40 mg by mouth at bedtime. 1/2 tablet daily (40mg )    [provider]  furosemide (LASIX) 20 MG tablet Take  1 tablet (20 mg total) by mouth daily. Patient not taking: Reported on 10/18/2017 07/28/17   Osvaldo ShipperKrishnan, Gokul, MD  insulin detemir (LEVEMIR) 100 UNIT/ML injection Inject 0.15 mLs (15 Units total) into the skin 2 (two) times daily. Patient not taking: Reported on 07/29/2018 07/28/17   Osvaldo ShipperKrishnan, Gokul, MD  labetalol (NORMODYNE) 100 MG tablet Take 2 tablets (200 mg total) by mouth 2 (two) times daily. Patient not taking: Reported on 01/05/2018 07/28/17   Osvaldo ShipperKrishnan, Gokul, MD  lisinopril  (PRINIVIL,ZESTRIL) 10 MG tablet Take 1 tablet (10 mg total) by mouth daily. Patient taking differently: Take 20 mg by mouth daily.  01/07/18   Burnadette PopAdhikari, Amrit, MD  metoCLOPramide (REGLAN) 5 MG tablet Take 1 tablet (5 mg total) by mouth 3 (three) times daily for 3 days. Patient not taking: Reported on 01/05/2018 10/20/17 01/05/26  Leatha GildingGherghe, Costin M, MD  mycophenolate (CELLCEPT) 250 MG capsule Take 1,000 mg by mouth 2 (two) times daily.    [provider]  ondansetron (ZOFRAN) 4 MG tablet Take 1 tablet (4 mg total) by mouth every 6 (six) hours as needed for nausea. 01/07/18   Burnadette PopAdhikari, Amrit, MD  pantoprazole (PROTONIX) 40 MG tablet Take 1 tablet (40 mg total) by mouth daily. 07/28/17   Osvaldo ShipperKrishnan, Gokul, MD  potassium chloride 20 MEQ TBCR Take 20 mEq by mouth daily. 01/07/18   Burnadette PopAdhikari, Amrit, MD  potassium chloride SA (K-DUR,KLOR-CON) 20 MEQ tablet Take 20 mEq by mouth daily.    [provider]  tacrolimus (PROGRAF) 5 MG capsule Take 5 mg by mouth 2 (two) times daily.    [provider]    Family History Family History  Problem Relation Age of Onset  . Hypertension Other     Social History Social History   Tobacco Use  . Smoking status: Former Games developermoker  . Smokeless tobacco: Never Used  . Tobacco comment: quit i [id number]  Substance Use Topics  . Alcohol use: Yes    Comment: 1 beer rarely   . Drug use: No     Allergies   Patient has no known allergies.   Review of Systems Review of Systems  All systems reviewed and negative, other than as noted in HPI.  Physical Exam Updated Vital Signs BP (!) 171/97 (BP Location: Left Arm)   Pulse (!) 132   Temp 98.4 F (36.9 C) (Oral)   Resp 20   SpO2 100%   Physical Exam Vitals signs and nursing note reviewed.  Constitutional:      Appearance: He is well-developed. He is diaphoretic.  HENT:     Head: Normocephalic and atraumatic.  Eyes:     General:        Right eye: No discharge.        Left eye: No  discharge.     Conjunctiva/sclera: Conjunctivae normal.  Neck:     Musculoskeletal: Neck supple.  Cardiovascular:     Rate and Rhythm: Regular rhythm.     Heart sounds: Normal heart sounds. No murmur. No friction rub. No gallop.      Comments: Tachycardic.  AV fistula right upper extremity with palpable thrill. Pulmonary:     Effort: Pulmonary effort is normal. No respiratory distress.     Breath sounds: Normal breath sounds.  Abdominal:     General: There is no distension.     Palpations: Abdomen is soft.     Tenderness: There is no abdominal tenderness.  Musculoskeletal:        General: No tenderness.  Skin:    General: Skin is warm.  Neurological:     Mental Status: He is alert.  Psychiatric:        Behavior: Behavior normal.        Thought Content: Thought content normal.      ED Treatments / Results  Labs (all labs ordered are listed, but only abnormal results are displayed) Labs Reviewed  COMPREHENSIVE METABOLIC PANEL - Abnormal; Notable for the following components:      Result Value   Sodium 134 (*)    Chloride 96 (*)    Glucose, Bld 281 (*)    Albumin 5.1 (*)    AST 14 (*)    All other components within normal limits  CBC - Abnormal; Notable for the following components:   WBC 18.4 (*)    RBC 6.39 (*)    HCT 53.4 (*)    All other components within normal limits  LIPASE, BLOOD  URINALYSIS, ROUTINE W REFLEX MICROSCOPIC  I-STAT TROPONIN, ED    EKG EKG Interpretation  Date/Time:  Monday September 12 2018 16:22:58 EST Ventricular Rate:  130 PR Interval:    QRS Duration: 83 QT Interval:  288 QTC Calculation: 424 R Axis:   91 Text Interpretation:  Sinus tachycardia LAE, consider biatrial enlargement Right axis deviation Consider left ventricular hypertrophy Borderline T abnormalities, inferior leads Confirmed by Raeford Razor [phone]) on 09/12/2018 4:54:43 PM   Radiology Ct Abdomen Pelvis W Contrast  Result Date: 09/12/2018 CLINICAL DATA:  Nausea  and vomiting for the past 5-6 days. Previous kidney transplant. EXAM: CT ABDOMEN AND PELVIS WITH CONTRAST TECHNIQUE: Multidetector CT imaging of the abdomen and pelvis was performed using the standard protocol following bolus administration of intravenous contrast. CONTRAST:  75mL ISOVUE-300 IOPAMIDOL (ISOVUE-300) INJECTION 61% COMPARISON:  10/20/2017, 02/01/2016 and 04/03/2010. FINDINGS: Lower chest: 4 mm left lower lobe nodule on image number 1 series 4, not included on the available previous examinations. Hepatobiliary: No focal liver abnormality is seen. No gallstones, gallbladder wall thickening, or biliary dilatation. Pancreas: Unremarkable. No pancreatic ductal dilatation or surrounding inflammatory changes. Spleen: Normal in size without focal abnormality. Adrenals/Urinary Tract: Normal appearing adrenal glands. Stable atrophied native kidneys. Stable hypertrophied left pelvic transplant kidney. There has been no significant change in a small wedge-shaped area of low density extending through the cortex of the upper pole of the left kidney medially and a thin similar area in the lower pole of the transplant kidney inferiorly compared to 10/20/2017. Again demonstrated is normal excretion of contrast by the left kidney with a normal appearing renal collecting system and ureter. Unremarkable urinary bladder. Stomach/Bowel: Unremarkable stomach, small bowel and colon. No evidence of appendicitis. Vascular/Lymphatic: Atheromatous arterial calcifications without aneurysm. No enlarged lymph nodes. Reproductive: Prostate is unremarkable. Other: Midline surgical scar. Right mid abdominal surgical staples/clips. Musculoskeletal: Lumbar spine degenerative changes and mild lower thoracic spine degenerative changes. IMPRESSION: 1. No acute abnormality. 2. Stable left pelvic transplant kidney with stable probable small cortical scars as described above. 3. 4 mm left lower lobe nodule, not previously included. No follow-up  needed if patient is low-risk. Non-contrast chest CT can be considered in 12 months if patient is high-risk. This recommendation follows the consensus statement: Guidelines for Management of Incidental Pulmonary Nodules Detected on CT Images: From the Fleischner Society 2017; Radiology 2017; 284:228-243. Electronically Signed   By: Beckie Salts M.D.   On: 09/12/2018 21:05    Procedures Procedures (including critical care time)  Medications Ordered in ED Medications  ondansetron (  ZOFRAN-ODT) disintegrating tablet 4 mg (has no administration in time range)     Initial Impression / Assessment and Plan / ED Course  I have reviewed the triage vital signs and the nursing notes.  Pertinent labs & imaging results that were available during my care of the patient were reviewed by me and considered in my medical decision making (see chart for details).     67 year old male with nausea/vomiting.  Upon further review of records, he was admitted approximately 6 weeks ago with similar sounding symptoms.  Continues to smoke marijuana.  Remains pretty tachycardic at rest despite IV fluids. Diaphoretic. Still very nauseated and hx of renal transplant. Will discuss with medicine for admission.  Final Clinical Impressions(s) / ED Diagnoses   Final diagnoses:  Intractable nausea and vomiting  History of renal transplant  Nausea and vomiting, intractability of vomiting not specified, unspecified vomiting type    ED Discharge Orders    None       Raeford Razor, MD 09/12/18 2237

## 2018-09-12 NOTE — ED Notes (Signed)
Bed: WA10 Expected date:  Expected time:  Means of arrival:  Comments: Triage 2 

## 2018-09-13 ENCOUNTER — Other Ambulatory Visit: Payer: Self-pay

## 2018-09-13 DIAGNOSIS — Z8673 Personal history of transient ischemic attack (TIA), and cerebral infarction without residual deficits: Secondary | ICD-10-CM | POA: Diagnosis not present

## 2018-09-13 DIAGNOSIS — Z87891 Personal history of nicotine dependence: Secondary | ICD-10-CM | POA: Diagnosis not present

## 2018-09-13 DIAGNOSIS — I10 Essential (primary) hypertension: Secondary | ICD-10-CM | POA: Diagnosis present

## 2018-09-13 DIAGNOSIS — G4733 Obstructive sleep apnea (adult) (pediatric): Secondary | ICD-10-CM | POA: Diagnosis present

## 2018-09-13 DIAGNOSIS — F419 Anxiety disorder, unspecified: Secondary | ICD-10-CM | POA: Diagnosis present

## 2018-09-13 DIAGNOSIS — Z79899 Other long term (current) drug therapy: Secondary | ICD-10-CM | POA: Diagnosis not present

## 2018-09-13 DIAGNOSIS — Z794 Long term (current) use of insulin: Secondary | ICD-10-CM | POA: Diagnosis not present

## 2018-09-13 DIAGNOSIS — H548 Legal blindness, as defined in USA: Secondary | ICD-10-CM | POA: Diagnosis present

## 2018-09-13 DIAGNOSIS — E1043 Type 1 diabetes mellitus with diabetic autonomic (poly)neuropathy: Secondary | ICD-10-CM | POA: Diagnosis present

## 2018-09-13 DIAGNOSIS — E1051 Type 1 diabetes mellitus with diabetic peripheral angiopathy without gangrene: Secondary | ICD-10-CM | POA: Diagnosis present

## 2018-09-13 DIAGNOSIS — Z9989 Dependence on other enabling machines and devices: Secondary | ICD-10-CM

## 2018-09-13 DIAGNOSIS — Z8249 Family history of ischemic heart disease and other diseases of the circulatory system: Secondary | ICD-10-CM | POA: Diagnosis not present

## 2018-09-13 DIAGNOSIS — R112 Nausea with vomiting, unspecified: Secondary | ICD-10-CM | POA: Diagnosis not present

## 2018-09-13 DIAGNOSIS — F1290 Cannabis use, unspecified, uncomplicated: Secondary | ICD-10-CM | POA: Diagnosis present

## 2018-09-13 DIAGNOSIS — Z89512 Acquired absence of left leg below knee: Secondary | ICD-10-CM | POA: Diagnosis not present

## 2018-09-13 DIAGNOSIS — F4310 Post-traumatic stress disorder, unspecified: Secondary | ICD-10-CM | POA: Diagnosis present

## 2018-09-13 DIAGNOSIS — K3184 Gastroparesis: Secondary | ICD-10-CM | POA: Diagnosis present

## 2018-09-13 DIAGNOSIS — K219 Gastro-esophageal reflux disease without esophagitis: Secondary | ICD-10-CM | POA: Diagnosis present

## 2018-09-13 DIAGNOSIS — E785 Hyperlipidemia, unspecified: Secondary | ICD-10-CM | POA: Diagnosis present

## 2018-09-13 DIAGNOSIS — Z940 Kidney transplant status: Secondary | ICD-10-CM | POA: Diagnosis not present

## 2018-09-13 DIAGNOSIS — Z7982 Long term (current) use of aspirin: Secondary | ICD-10-CM | POA: Diagnosis not present

## 2018-09-13 DIAGNOSIS — Z9483 Pancreas transplant status: Secondary | ICD-10-CM | POA: Diagnosis not present

## 2018-09-13 DIAGNOSIS — I160 Hypertensive urgency: Secondary | ICD-10-CM | POA: Diagnosis present

## 2018-09-13 LAB — GLUCOSE, CAPILLARY
Glucose-Capillary: 162 mg/dL — ABNORMAL HIGH (ref 70–99)
Glucose-Capillary: 150 mg/dL — ABNORMAL HIGH (ref 70–99)

## 2018-09-13 LAB — BASIC METABOLIC PANEL
Anion gap: 12 (ref 5–15)
BUN: 21 mg/dL (ref 8–23)
CO2: 23 mmol/L (ref 22–32)
Calcium: 9.5 mg/dL (ref 8.9–10.3)
Chloride: 103 mmol/L (ref 98–111)
Creatinine, Ser: 0.82 mg/dL (ref 0.61–1.24)
GFR calc Af Amer: >60 mL/min (ref >60)
GFR calc non Af Amer: >60 mL/min (ref >60)
Glucose, Bld: 239 mg/dL — ABNORMAL HIGH (ref 70–99)
Potassium: 3.9 mmol/L (ref 3.5–5.1)
Sodium: 138 mmol/L (ref 135–145)

## 2018-09-13 LAB — RAPID URINE DRUG SCREEN, HOSP PERFORMED
Amphetamines: NOT DETECTED
Barbiturates: NOT DETECTED
Benzodiazepines: NOT DETECTED
Cocaine: NOT DETECTED
Opiates: NOT DETECTED
Tetrahydrocannabinol: POSITIVE — AB

## 2018-09-13 LAB — CBG MONITORING, ED
Glucose-Capillary: 224 mg/dL — ABNORMAL HIGH (ref 70–99)
Glucose-Capillary: 176 mg/dL — ABNORMAL HIGH (ref 70–99)

## 2018-09-13 LAB — CBC
HCT: 47 % (ref 39.0–52.0)
Hemoglobin: 15.2 g/dL (ref 13.0–17.0)
MCH: 26.7 pg (ref 26.0–34.0)
MCHC: 32.3 g/dL (ref 30.0–36.0)
MCV: 82.5 fL (ref 80.0–100.0)
Platelets: 125 10*3/uL — ABNORMAL LOW (ref 150–400)
RBC: 5.7 MIL/uL (ref 4.22–5.81)
RDW: 14.5 % (ref 11.5–15.5)
WBC: 13.1 10*3/uL — ABNORMAL HIGH (ref 4.0–10.5)
nRBC: 0 % (ref 0.0–0.2)

## 2018-09-13 MED ORDER — INSULIN ASPART 100 UNIT/ML ~~LOC~~ SOLN
0.0000 [IU] | Freq: Three times a day (TID) | SUBCUTANEOUS | Status: DC
  Administered 2018-09-13 (×2): 2 [IU] via SUBCUTANEOUS
  Administered 2018-09-13: 3 [IU] via SUBCUTANEOUS
  Filled 2018-09-13 (×2): qty 1

## 2018-09-13 MED ORDER — INSULIN DETEMIR 100 UNIT/ML ~~LOC~~ SOLN
20.0000 [IU] | Freq: Every day | SUBCUTANEOUS | Status: DC
  Administered 2018-09-13 (×2): 20 [IU] via SUBCUTANEOUS
  Filled 2018-09-13 (×3): qty 0.2

## 2018-09-13 MED ORDER — LABETALOL HCL 100 MG PO TABS
200.0000 mg | ORAL_TABLET | Freq: Two times a day (BID) | ORAL | Status: DC
  Administered 2018-09-13 – 2018-09-14 (×4): 200 mg via ORAL
  Filled 2018-09-13: qty 1
  Filled 2018-09-13: qty 2
  Filled 2018-09-13: qty 1
  Filled 2018-09-13: qty 2

## 2018-09-13 NOTE — ED Notes (Signed)
Bed: [id number] Expected date:  Expected time:  Means of arrival:  Comments: Hold for 10

## 2018-09-13 NOTE — Progress Notes (Signed)
Patient ID: [name], male   DOB: 07-04-1951, 67 y.o.   MRN: [id number]  PROGRESS NOTE    [name]  OZD:[id number] DOB: August 15, 1951 DOA: 09/12/2018 PCP: Patient, No Pcp Per    Brief Narrative: [name] is a 67 y.o. 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  Symptoms developed early morning on 09/10/2018 when he says he took his medications on an empty stomach.  Since then he has had frequent clear emesis and has been unable to tolerate oral intake including his home medications.  He has had associated diaphoresis.  He reports occasional marijuana use with last use a few days ago.  He denies any fevers, chest pain, dyspnea, abdominal pain, dysuria.  Patient was admitted therefore with worsening renal failure and intractable nausea vomiting   Assessment & Plan:   Principal Problem:   Intractable nausea and vomiting Active Problems:   Diabetes mellitus with peripheral vascular disease (HCC)   Essential hypertension   PVD (peripheral vascular disease) (HCC)   Hypertensive urgency   Kidney transplant recipient   OSA on CPAP   #1 intractable nausea with vomiting: Appears resolving at the moment.  Patient has not had any vomiting a few hours.  We will start him on clear liquid diet and advance as tolerated.  Symptomatic treatment will continue.  More likely secondary to marijuana use and patient has been counseled.  #2 essential hypertension with urgency: Pressure is currently improving.  It was extremely elevated.  We are using PRN labetalol.  Resume home regimen as well since he is able to tolerate p.o.'s.  #3 diabetes: Continue with blood sugar control with sliding scale insulin.  Patient is insulin-dependent and will continue with home regimen as well  #4 peripheral  vascular disease: Status post BKA, continue home regimen.  #5 status post kidney and pancreatic transplant: Patient currently on CellCept and Prograf.  We will continue   DVT prophylaxis: Lovenox Code Status: Full code Family Communication: No family available Disposition Plan: To be determined  Consultants:   None   Procedures:   None   Antimicrobials: None   Subjective: Patient laying in bed.  No nausea vomiting since admission.  No new complaints.  Objective: Vitals:   09/13/18 0530 09/13/18 0600 09/13/18 0630 09/13/18 0700  BP: (!) 181/82 (!) 225/105 (!) 198/92 (!) 179/91  Pulse: (!) 103 (!) 109 (!) 107 (!) 103  Resp: (!) 22 (!) 22 19   Temp:      TempSrc:      SpO2: 97% 98% 97% 98%    Intake/Output Summary (Last 24 hours) at 09/13/2018 0813 Last data filed at 09/12/2018 2100 Gross per 24 hour  Intake 2000 ml  Output -  Net 2000 ml   There were no vitals filed for this visit.  Examination:  General exam: Appears calm and comfortable  Respiratory system: Clear to auscultation. Respiratory effort normal. Cardiovascular system: S1 & S2 heard, RRR. No JVD, murmurs, rubs, gallops or clicks. No pedal edema. Gastrointestinal system: Abdomen is nondistended, soft and nontender. No organomegaly or masses felt. Normal bowel sounds heard. Central nervous system: Alert and oriented. No focal neurological deficits. Extremities: Status post left BKA. Skin: No rashes, lesions or ulcers Psychiatry: Judgement and insight appear normal. Mood & affect appropriate.     Data Reviewed: I  have personally reviewed following labs and imaging studies  CBC: Recent Labs  Lab 09/12/18 1617  WBC 18.4*  HGB 17.0  HCT 53.4*  MCV 83.6  PLT 165   Basic Metabolic Panel: Recent Labs  Lab 09/12/18 1617  NA 134*  K 4.0  CL 96*  CO2 24  GLUCOSE 281*  BUN 22  CREATININE 0.93  CALCIUM 10.0   GFR: CrCl cannot be calculated (Unknown ideal weight.). Liver Function  Tests: Recent Labs  Lab 09/12/18 1617  AST 14*  ALT 14  ALKPHOS 87  BILITOT 1.2  PROT 7.7  ALBUMIN 5.1*   Recent Labs  Lab 09/12/18 1617  LIPASE 37   No results for input(s): AMMONIA in the last 168 hours. Coagulation Profile: No results for input(s): INR, PROTIME in the last 168 hours. Cardiac Enzymes: No results for input(s): CKTOTAL, CKMB, CKMBINDEX, TROPONINI in the last 168 hours. BNP (last 3 results) No results for input(s): PROBNP in the last 8760 hours. HbA1C: No results for input(s): HGBA1C in the last 72 hours. CBG: Recent Labs  Lab 09/13/18 0754  GLUCAP 224*   Lipid Profile: No results for input(s): CHOL, HDL, LDLCALC, TRIG, CHOLHDL, LDLDIRECT in the last 72 hours. Thyroid Function Tests: No results for input(s): TSH, T4TOTAL, FREET4, T3FREE, THYROIDAB in the last 72 hours. Anemia Panel: No results for input(s): VITAMINB12, FOLATE, FERRITIN, TIBC, IRON, RETICCTPCT in the last 72 hours. Sepsis Labs: No results for input(s): PROCALCITON, LATICACIDVEN in the last 168 hours.  No results found for this or any previous visit (from the past 240 hour(s)).       Radiology Studies: Ct Abdomen Pelvis W Contrast  Result Date: 09/12/2018 CLINICAL DATA:  Nausea and vomiting for the past 5-6 days. Previous kidney transplant. EXAM: CT ABDOMEN AND PELVIS WITH CONTRAST TECHNIQUE: Multidetector CT imaging of the abdomen and pelvis was performed using the standard protocol following bolus administration of intravenous contrast. CONTRAST:  75mL ISOVUE-300 IOPAMIDOL (ISOVUE-300) INJECTION 61% COMPARISON:  10/20/2017, 02/01/2016 and 04/03/2010. FINDINGS: Lower chest: 4 mm left lower lobe nodule on image number 1 series 4, not included on the available previous examinations. Hepatobiliary: No focal liver abnormality is seen. No gallstones, gallbladder wall thickening, or biliary dilatation. Pancreas: Unremarkable. No pancreatic ductal dilatation or surrounding inflammatory  changes. Spleen: Normal in size without focal abnormality. Adrenals/Urinary Tract: Normal appearing adrenal glands. Stable atrophied native kidneys. Stable hypertrophied left pelvic transplant kidney. There has been no significant change in a small wedge-shaped area of low density extending through the cortex of the upper pole of the left kidney medially and a thin similar area in the lower pole of the transplant kidney inferiorly compared to 10/20/2017. Again demonstrated is normal excretion of contrast by the left kidney with a normal appearing renal collecting system and ureter. Unremarkable urinary bladder. Stomach/Bowel: Unremarkable stomach, small bowel and colon. No evidence of appendicitis. Vascular/Lymphatic: Atheromatous arterial calcifications without aneurysm. No enlarged lymph nodes. Reproductive: Prostate is unremarkable. Other: Midline surgical scar. Right mid abdominal surgical staples/clips. Musculoskeletal: Lumbar spine degenerative changes and mild lower thoracic spine degenerative changes. IMPRESSION: 1. No acute abnormality. 2. Stable left pelvic transplant kidney with stable probable small cortical scars as described above. 3. 4 mm left lower lobe nodule, not previously included. No follow-up needed if patient is low-risk. Non-contrast chest CT can be considered in 12 months if patient is high-risk. This recommendation follows the consensus statement: Guidelines for Management of Incidental Pulmonary Nodules Detected on CT Images: From the Fleischner Society 2017;  Radiology 2017; [id number]. Electronically Signed   By: Beckie SaltsSteven  Reid M.D.   On: 09/12/2018 21:05        Scheduled Meds: . amLODipine  10 mg Oral Daily  . aspirin EC  81 mg Oral Daily  . atorvastatin  40 mg Oral QHS  . enoxaparin (LOVENOX) injection  40 mg Subcutaneous QHS  . insulin aspart  0-9 Units Subcutaneous TID WC  . insulin detemir  20 Units Subcutaneous QHS  . labetalol  200 mg Oral BID  . lisinopril  20 mg  Oral Daily  . metoCLOPramide (REGLAN) injection  5 mg Intravenous Q8H  . mycophenolate  1,000 mg Oral BID  . tacrolimus  5 mg Oral BID   Continuous Infusions: . 0.9 % NaCl with KCl 20 mEq / L 125 mL/hr at 09/13/18 0400     LOS: 0 days    Time spent: 35 minutes    Washington Whedbee,LAWAL, MD [facility] Pager [phone]  If 7PM-7AM, please contact night-coverage [email or web address] Password [email or web address] 09/13/2018, 8:13 AM

## 2018-09-13 NOTE — Progress Notes (Signed)
Pt has declined use of CPAP QHS.  Pt to notify RT if he changes his mind, RT to monitor and and assess as needed.

## 2018-09-14 LAB — CBC WITH DIFFERENTIAL/PLATELET
Abs Immature Granulocytes: 0.07 10*3/uL (ref 0.00–0.07)
Basophils Absolute: 0 10*3/uL (ref 0.0–0.1)
Basophils Relative: 0 %
Eosinophils Absolute: 0 10*3/uL (ref 0.0–0.5)
Eosinophils Relative: 0 %
HCT: 45.6 % (ref 39.0–52.0)
Hemoglobin: 14.5 g/dL (ref 13.0–17.0)
Immature Granulocytes: 1 %
Lymphocytes Relative: 17 %
Lymphs Abs: 1.6 10*3/uL (ref 0.7–4.0)
MCH: 26.1 pg (ref 26.0–34.0)
MCHC: 31.8 g/dL (ref 30.0–36.0)
MCV: 82 fL (ref 80.0–100.0)
Monocytes Absolute: 1 10*3/uL (ref 0.1–1.0)
Monocytes Relative: 10 %
Neutro Abs: 6.8 10*3/uL (ref 1.7–7.7)
Neutrophils Relative %: 72 %
Platelets: 144 10*3/uL — ABNORMAL LOW (ref 150–400)
RBC: 5.56 MIL/uL (ref 4.22–5.81)
RDW: 14.3 % (ref 11.5–15.5)
WBC: 9.4 10*3/uL (ref 4.0–10.5)
nRBC: 0 % (ref 0.0–0.2)

## 2018-09-14 LAB — BASIC METABOLIC PANEL
Anion gap: 10 (ref 5–15)
BUN: 22 mg/dL (ref 8–23)
CO2: 24 mmol/L (ref 22–32)
Calcium: 9.3 mg/dL (ref 8.9–10.3)
Chloride: 101 mmol/L (ref 98–111)
Creatinine, Ser: 0.8 mg/dL (ref 0.61–1.24)
GFR calc Af Amer: >60 mL/min (ref >60)
GFR calc non Af Amer: >60 mL/min (ref >60)
Glucose, Bld: 99 mg/dL (ref 70–99)
Potassium: 3.5 mmol/L (ref 3.5–5.1)
Sodium: 135 mmol/L (ref 135–145)

## 2018-09-14 LAB — GLUCOSE, CAPILLARY
Glucose-Capillary: 91 mg/dL (ref 70–99)
Glucose-Capillary: 106 mg/dL — ABNORMAL HIGH (ref 70–99)

## 2018-09-14 NOTE — Progress Notes (Signed)
Pt. was able to tolerate po medications and some of his lunch. Discharge teaching completed with teach back. Discharge instructions given and reviewed with pt.  MD notified to call in prescriptions for Reglan and Protonix to [facility]. Pt. understands to pick up medications at pharmacy.

## 2018-09-14 NOTE — Progress Notes (Signed)
Pt. left via wheelchair, discharged to home. No respiratory distress noted. 

## 2018-09-14 NOTE — Discharge Summary (Signed)
Physician Discharge Summary  [name] XBM:[id number] DOB: [birth year] DOA: 09/12/2018  PCP: Patient, No Pcp Per  Admit date: 09/12/2018 Discharge date: 09/14/2018  Time spent: 33 minutes  Recommendations for Outpatient Follow-up:  1. Follow-up with the [facility] 2. Continue home regimen of medications and take bland diet for now   Discharge Diagnoses:  Principal Problem:   Intractable nausea and vomiting Active Problems:   Diabetes mellitus with peripheral vascular disease (HCC)   Essential hypertension   PVD (peripheral vascular disease) (HCC)   Hypertensive urgency   Kidney transplant recipient   OSA on CPAP   Discharge Condition: Good  Diet recommendation: Bland diet, diabetic  Filed Weights   09/13/18 1238 09/14/18 0614  Weight: 72.6 kg 69.7 kg    History of present illness:  [name] is a 67 y.o. 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  Symptoms developed early morning on 09/10/2018 when he says he took his medications on an empty stomach.  Since then he has had frequent clear emesis and has been unable to tolerate oral intake including his home medications.  He has had associated diaphoresis.  He reports occasional marijuana use with last use a few days ago.  He denies any fevers, chest pain, dyspnea, abdominal pain, dysuria.  Patient admitted for symptomatic management of intractable nausea vomiting  Hospital Course:  Patient was admitted and supportive care initiated.  He appears to have more gastroparesis symptoms.  Continue with IV Reglan and Protonix.  Supportive care with IV fluids.  He has done much better overnight.  Blood sugar and blood pressure have been controlled on home regimen.  His diet was advanced and he tolerated his diet well.   At this point he was discharged home since he has literally been back to baseline.  He will follow-up with PCP.  Procedures: CT abdomen pelvis  Consultations:  None  Discharge Exam: Vitals:   09/14/18 0614 09/14/18 1021  BP: (!) 161/67 (!) 187/86  Pulse: 81 98  Resp: 18   Temp: 98.4 F (36.9 C)   SpO2: 98%     General: Stable with no acute distress Cardiovascular: Regular rate and rhythm Respiratory: Good air entry bilaterally  Discharge Instructions   Discharge Instructions    Diet - low sodium heart healthy   Complete by:  As directed    Increase activity slowly   Complete by:  As directed      Allergies as of 09/14/2018   No Known Allergies     Medication List    TAKE these medications   amLODipine 10 MG tablet Commonly known as:  NORVASC Take 10 mg by mouth daily.   aspirin EC 81 MG tablet Take 81 mg by mouth daily.   atorvastatin 80 MG tablet Commonly known as:  LIPITOR Take 40 mg by mouth at bedtime. 1/2 tablet daily (40mg )   furosemide 20 MG tablet Commonly known as:  LASIX Take 1 tablet (20 mg total) by mouth daily.   insulin detemir 100 UNIT/ML injection Commonly known as:  LEVEMIR Inject 0.15 mLs (15 Units total) into the skin 2 (two) times daily. What changed:    how much to take  when to take this   labetalol 100 MG tablet Commonly known as:  NORMODYNE Take 2 tablets (200 mg total)  by mouth 2 (two) times daily.   lisinopril 10 MG tablet Commonly known as:  PRINIVIL,ZESTRIL Take 1 tablet (10 mg total) by mouth daily. What changed:  how much to take   metoCLOPramide 5 MG tablet Commonly known as:  REGLAN Take 1 tablet (5 mg total) by mouth 3 (three) times daily for 3 days.   mycophenolate 250 MG capsule Commonly known as:  CELLCEPT Take 1,000 mg by mouth 2 (two) times daily.   ondansetron 4 MG tablet Commonly known as:  ZOFRAN Take 1 tablet (4 mg total) by mouth every 6 (six) hours as needed for nausea.   pantoprazole 40  MG tablet Commonly known as:  PROTONIX Take 1 tablet (40 mg total) by mouth daily.   Potassium Chloride ER 20 MEQ Tbcr Take 20 mEq by mouth daily.   tacrolimus 5 MG capsule Commonly known as:  PROGRAF Take 5 mg by mouth 2 (two) times daily.      No Known Allergies    The results of significant diagnostics from this hospitalization (including imaging, microbiology, ancillary and laboratory) are listed below for reference.    Significant Diagnostic Studies: Ct Abdomen Pelvis W Contrast  Result Date: 09/12/2018 CLINICAL DATA:  Nausea and vomiting for the past 5-6 days. Previous kidney transplant. EXAM: CT ABDOMEN AND PELVIS WITH CONTRAST TECHNIQUE: Multidetector CT imaging of the abdomen and pelvis was performed using the standard protocol following bolus administration of intravenous contrast. CONTRAST:  75mL ISOVUE-300 IOPAMIDOL (ISOVUE-300) INJECTION 61% COMPARISON:  10/20/2017, 02/01/2016 and 04/03/2010. FINDINGS: Lower chest: 4 mm left lower lobe nodule on image number 1 series 4, not included on the available previous examinations. Hepatobiliary: No focal liver abnormality is seen. No gallstones, gallbladder wall thickening, or biliary dilatation. Pancreas: Unremarkable. No pancreatic ductal dilatation or surrounding inflammatory changes. Spleen: Normal in size without focal abnormality. Adrenals/Urinary Tract: Normal appearing adrenal glands. Stable atrophied native kidneys. Stable hypertrophied left pelvic transplant kidney. There has been no significant change in a small wedge-shaped area of low density extending through the cortex of the upper pole of the left kidney medially and a thin similar area in the lower pole of the transplant kidney inferiorly compared to 10/20/2017. Again demonstrated is normal excretion of contrast by the left kidney with a normal appearing renal collecting system and ureter. Unremarkable urinary bladder. Stomach/Bowel: Unremarkable stomach, small bowel and  colon. No evidence of appendicitis. Vascular/Lymphatic: Atheromatous arterial calcifications without aneurysm. No enlarged lymph nodes. Reproductive: Prostate is unremarkable. Other: Midline surgical scar. Right mid abdominal surgical staples/clips. Musculoskeletal: Lumbar spine degenerative changes and mild lower thoracic spine degenerative changes. IMPRESSION: 1. No acute abnormality. 2. Stable left pelvic transplant kidney with stable probable small cortical scars as described above. 3. 4 mm left lower lobe nodule, not previously included. No follow-up needed if patient is low-risk. Non-contrast chest CT can be considered in 12 months if patient is high-risk. This recommendation follows the consensus statement: Guidelines for Management of Incidental Pulmonary Nodules Detected on CT Images: From the Fleischner Society 2017; Radiology 2017; 284:228-243. Electronically Signed   By: Beckie Salts M.D.   On: 09/12/2018 21:05    Microbiology: No results found for this or any previous visit (from the past 240 hour(s)).   Labs: Basic Metabolic Panel: Recent Labs  Lab 09/12/18 1617 09/13/18 0756 09/14/18 0906  NA 134* 138 135  K 4.0 3.9 3.5  CL 96* 103 101  CO2 24 23 24   GLUCOSE 281* 239* 99  BUN 22 21 22  CREATININE 0.93 0.82 0.80  CALCIUM 10.0 9.5 9.3   Liver Function Tests: Recent Labs  Lab 09/12/18 1617  AST 14*  ALT 14  ALKPHOS 87  BILITOT 1.2  PROT 7.7  ALBUMIN 5.1*   Recent Labs  Lab 09/12/18 1617  LIPASE 37   No results for input(s): AMMONIA in the last 168 hours. CBC: Recent Labs  Lab 09/12/18 1617 09/13/18 0756 09/14/18 0906  WBC 18.4* 13.1* 9.4  NEUTROABS  --   --  6.8  HGB 17.0 15.2 14.5  HCT 53.4* 47.0 45.6  MCV 83.6 82.5 82.0  PLT 165 125* 144*   Cardiac Enzymes: No results for input(s): CKTOTAL, CKMB, CKMBINDEX, TROPONINI in the last 168 hours. BNP: BNP (last 3 results) No results for input(s): BNP in the last 8760 hours.  ProBNP (last 3 results) No  results for input(s): PROBNP in the last 8760 hours.  CBG: Recent Labs  Lab 09/13/18 1241 09/13/18 1702 09/13/18 2119 09/14/18 0750 09/14/18 1301  GLUCAP 176* 162* 150* 91 106*       Signed:  Miko Sirico,LAWAL MD.  [facility] 09/14/2018, 1:38 PM

## 2018-11-02 ENCOUNTER — Ambulatory Visit: Payer: Medicare Other | Admitting: Family

## 2018-11-02 ENCOUNTER — Telehealth: Payer: Self-pay | Admitting: Family

## 2018-11-02 DIAGNOSIS — Z0289 Encounter for other administrative examinations: Secondary | ICD-10-CM

## 2018-11-02 NOTE — Telephone Encounter (Signed)
This patient has missed both his NP patient appointments with me. He appears to be getting care through the [place]. Please do not re-schedule NP appt here.

## 2018-11-02 NOTE — Telephone Encounter (Signed)
I have placed restrictions for patient to not be able to schedule with our providers taking new patients here at [facility].

## 2018-11-23 ENCOUNTER — Ambulatory Visit (INDEPENDENT_AMBULATORY_CARE_PROVIDER_SITE_OTHER): Payer: Non-veteran care | Admitting: Orthopedic Surgery

## 2018-11-28 ENCOUNTER — Encounter (INDEPENDENT_AMBULATORY_CARE_PROVIDER_SITE_OTHER): Payer: Self-pay | Admitting: Orthopedic Surgery

## 2018-11-28 ENCOUNTER — Ambulatory Visit (INDEPENDENT_AMBULATORY_CARE_PROVIDER_SITE_OTHER): Payer: Medicare Other | Admitting: Orthopedic Surgery

## 2018-11-28 VITALS — Ht 70.0 in | Wt 153.7 lb

## 2018-11-28 DIAGNOSIS — E108 Type 1 diabetes mellitus with unspecified complications: Secondary | ICD-10-CM | POA: Diagnosis not present

## 2018-11-28 DIAGNOSIS — Z940 Kidney transplant status: Secondary | ICD-10-CM

## 2018-11-28 DIAGNOSIS — E1051 Type 1 diabetes mellitus with diabetic peripheral angiopathy without gangrene: Secondary | ICD-10-CM

## 2018-11-28 DIAGNOSIS — Z89512 Acquired absence of left leg below knee: Secondary | ICD-10-CM | POA: Diagnosis not present

## 2018-11-28 DIAGNOSIS — I129 Hypertensive chronic kidney disease with stage 1 through stage 4 chronic kidney disease, or unspecified chronic kidney disease: Secondary | ICD-10-CM | POA: Diagnosis not present

## 2018-11-28 DIAGNOSIS — S88112A Complete traumatic amputation at level between knee and ankle, left lower leg, initial encounter: Secondary | ICD-10-CM

## 2018-11-28 NOTE — Progress Notes (Signed)
Office Visit Note   Patient: [name]           Date of Birth: 1951-09-06           MRN: [id number] Visit Date: 11/28/2018              Requested by: No referring provider defined for this encounter. PCP: Patient, No Pcp Per  Chief Complaint  Patient presents with  . Right Leg - Follow-up    BKA left leg      HPI: Patient is a 68 year old gentleman who is 4 years status post a left transtibial amputation.  Patient states his leg is now unstable he has lost significant volume he is wearing over 15 ply sock he has no rotational stability patient wished to be evaluated for a new prosthesis.  Assessment & Plan: Visit Diagnoses:  1. Below-knee amputation of left lower extremity (HCC)     Plan: Patient was given a prescription for a new socket new liner new socks and materials.  The foot and ankle appears intact.  Patient will follow-up with [facility] for a new prosthesis.  Follow-Up Instructions: Return if symptoms worsen or fail to improve.   Ortho Exam  Patient is alert, oriented, no adenopathy, well-dressed, normal affect, normal respiratory effort. Examination patient can independently get up from a chair to standing position he uses a cane for ambulation.  Patient is in bearing in the socket he has over 15 ply socks in the liner is broken.  Patient has no rotational stability with his socket.  Patient has poorly controlled type 2 diabetes with a hemoglobin A1c of 11.2.  This was last checked about a year ago.  Imaging: No results found. No images are attached to the encounter.  Labs: Lab Results  Component Value Date   HGBA1C 11.2 (H) 01/05/2018   HGBA1C 10.4 (H) 07/24/2017   HGBA1C 11.8 (H) 01/13/2015   REPTSTATUS 08/02/2018 FINAL 07/28/2018   GRAMSTAIN  01/13/2015    MODERATE WBC PRESENT, PREDOMINANTLY PMN FEW SQUAMOUS EPITHELIAL CELLS PRESENT MODERATE GRAM POSITIVE COCCI IN PAIRS IN CHAINS IN CLUSTERS Performed at [facility]    CULT  07/28/2018      NO GROWTH 5 DAYS Performed at [facility], [address] [place], [place] [address]    Jonita Albee 01/13/2015     Lab Results  Component Value Date   ALBUMIN 5.1 (H) 09/12/2018   ALBUMIN 4.9 07/27/2018   ALBUMIN 4.8 01/04/2018    Body mass index is 22.05 kg/m.  Orders:  No orders of the defined types were placed in this encounter.  No orders of the defined types were placed in this encounter.    Procedures: No procedures performed  Clinical Data: No additional findings.  ROS:  All other systems negative, except as noted in the HPI. Review of Systems  Objective: Vital Signs: Ht 5\' 10"  (1.778 m)   Wt 153 lb 10.6 oz (69.7 kg)   BMI 22.05 kg/m   Specialty Comments:  No specialty comments available.  PMFS History: Patient Active Problem List   Diagnosis Date Noted  . OSA on CPAP 09/13/2018  . Intractable nausea and vomiting 09/12/2018  . Kidney transplant recipient 09/12/2018  . DKA, type 1 (HCC) 01/05/2018  . Nausea and vomiting 10/19/2017  . Nausea & vomiting 10/18/2017  . Hematemesis 07/24/2017  . Hypertensive urgency 07/24/2017  . Acute GI bleeding 07/24/2017  . 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  S/P BKA (below knee amputation) unilateral (HCC) 02/20/2015  . Ischemic ulcer of toe (HCC)   . Peripheral vascular disease (HCC)   . PVD (peripheral vascular disease) (HCC) 01/17/2015  . Cellulitis and abscess of foot 01/13/2015  . Cellulitis and abscess of foot excluding toe 01/13/2015  . Diabetes mellitus without complication (HCC) 01/13/2015  . Leukocytosis 01/13/2015  . Diabetes mellitus with peripheral vascular disease (HCC) 12/19/2007  . DYSLIPIDEMIA 12/19/2007  . ANEMIA 12/19/2007  . ANXIETY 12/19/2007  . Essential hypertension 12/19/2007  . TRANSIENT ISCHEMIC ATTACK 12/19/2007  . PERIPHERAL VASCULAR DISEASE 12/19/2007  . RENAL FAILURE 12/19/2007   Past Medical History:   Diagnosis Date  . Anxiety   . CPAP (continuous positive airway pressure) dependence   . Diabetes mellitus without complication (HCC)   . GERD (gastroesophageal reflux disease)   . Hypertension   . Legally blind in left eye, as defined in [place]   . PTSD (post-traumatic stress disorder)   . PVD (peripheral vascular disease) (HCC) 01/17/2015  . Renal disorder   . Sleep apnea   . Thyroid disease     Family History  Problem Relation Age of Onset  . Hypertension Other     Past Surgical History:  Procedure Laterality Date  . ABDOMINAL AORTAGRAM N/A 01/21/2015   Procedure: ABDOMINAL Ronny Flurry;  Surgeon: Chuck Hint, MD;  Location: [facility] CATH LAB;  Service: Cardiovascular;  Laterality: N/A;  . AMPUTATION Left 01/23/2015   Procedure: FIRST RAY AMPUTATION/LEFT FOOT;  Surgeon: Nadara Mustard, MD;  Location: [facility] OR;  Service: Orthopedics;  Laterality: Left;  . AMPUTATION Left 02/20/2015   Procedure: AMPUTATION BELOW KNEE;  Surgeon: Nadara Mustard, MD;  Location: [facility] OR;  Service: Orthopedics;  Laterality: Left;  . AV FISTULA PLACEMENT    . COLONOSCOPY    . COMBINED KIDNEY-PANCREAS TRANSPLANT Left 2009  . INCISION AND DRAINAGE ABSCESS Left 01/13/2015   Procedure: INCISION AND DRAINAGE OF LEFT FOOT FIRST RAY ABCESS;  Surgeon: Kathryne Hitch, MD;  Location: [facility] OR;  Service: Orthopedics;  Laterality: Left;  . LOWER EXTREMITY ANGIOGRAM Left 01/21/2015   Procedure: LOWER EXTREMITY ANGIOGRAM;  Surgeon: Chuck Hint, MD;  Location: [facility] CATH LAB;  Service: Cardiovascular;  Laterality: Left;   Social History   Occupational History  . Not on file  Tobacco Use  . Smoking status: Former Games developer  . Smokeless tobacco: Never Used  . Tobacco comment: quit i [id number]  Substance and Sexual Activity  . Alcohol use: Yes    Comment: 1 beer rarely   . Drug use: No  . Sexual activity: Not on file

## 2018-11-30 DIAGNOSIS — H2513 Age-related nuclear cataract, bilateral: Secondary | ICD-10-CM | POA: Diagnosis not present

## 2018-11-30 DIAGNOSIS — Z794 Long term (current) use of insulin: Secondary | ICD-10-CM | POA: Diagnosis not present

## 2018-11-30 DIAGNOSIS — E119 Type 2 diabetes mellitus without complications: Secondary | ICD-10-CM | POA: Diagnosis not present

## 2019-02-09 DIAGNOSIS — H2511 Age-related nuclear cataract, right eye: Secondary | ICD-10-CM | POA: Diagnosis not present

## 2019-02-09 DIAGNOSIS — H25811 Combined forms of age-related cataract, right eye: Secondary | ICD-10-CM | POA: Diagnosis not present

## 2019-02-16 DIAGNOSIS — H2512 Age-related nuclear cataract, left eye: Secondary | ICD-10-CM | POA: Diagnosis not present

## 2019-02-23 DIAGNOSIS — H25812 Combined forms of age-related cataract, left eye: Secondary | ICD-10-CM | POA: Diagnosis not present

## 2019-04-24 ENCOUNTER — Emergency Department (HOSPITAL_COMMUNITY)
Admission: EM | Admit: 2019-04-24 | Discharge: 2019-04-25 | Disposition: A | Discharge status: Home / Self Care | Payer: Medicare Other | Attending: Emergency Medicine | Admitting: Emergency Medicine

## 2019-04-24 ENCOUNTER — Encounter (HOSPITAL_COMMUNITY): Payer: Self-pay | Admitting: Emergency Medicine

## 2019-04-24 ENCOUNTER — Other Ambulatory Visit: Payer: Self-pay

## 2019-04-24 DIAGNOSIS — Z79899 Other long term (current) drug therapy: Secondary | ICD-10-CM | POA: Diagnosis not present

## 2019-04-24 DIAGNOSIS — E1165 Type 2 diabetes mellitus with hyperglycemia: Secondary | ICD-10-CM | POA: Diagnosis not present

## 2019-04-24 DIAGNOSIS — Z87891 Personal history of nicotine dependence: Secondary | ICD-10-CM | POA: Diagnosis not present

## 2019-04-24 DIAGNOSIS — Z940 Kidney transplant status: Secondary | ICD-10-CM | POA: Insufficient documentation

## 2019-04-24 DIAGNOSIS — E109 Type 1 diabetes mellitus without complications: Secondary | ICD-10-CM | POA: Diagnosis not present

## 2019-04-24 DIAGNOSIS — I10 Essential (primary) hypertension: Secondary | ICD-10-CM | POA: Diagnosis not present

## 2019-04-24 DIAGNOSIS — R42 Dizziness and giddiness: Secondary | ICD-10-CM | POA: Diagnosis not present

## 2019-04-24 DIAGNOSIS — R000 Tachycardia, unspecified: Secondary | ICD-10-CM | POA: Diagnosis not present

## 2019-04-24 DIAGNOSIS — Z89512 Acquired absence of left leg below knee: Secondary | ICD-10-CM | POA: Diagnosis not present

## 2019-04-24 DIAGNOSIS — I491 Atrial premature depolarization: Secondary | ICD-10-CM | POA: Diagnosis not present

## 2019-04-24 DIAGNOSIS — R112 Nausea with vomiting, unspecified: Secondary | ICD-10-CM

## 2019-04-24 DIAGNOSIS — R531 Weakness: Secondary | ICD-10-CM | POA: Diagnosis not present

## 2019-04-24 DIAGNOSIS — Z7982 Long term (current) use of aspirin: Secondary | ICD-10-CM | POA: Diagnosis not present

## 2019-04-24 DIAGNOSIS — Z20828 Contact with and (suspected) exposure to other viral communicable diseases: Secondary | ICD-10-CM | POA: Diagnosis not present

## 2019-04-24 DIAGNOSIS — R231 Pallor: Secondary | ICD-10-CM | POA: Diagnosis not present

## 2019-04-24 LAB — COMPREHENSIVE METABOLIC PANEL WITH GFR
ALT: 12 U/L (ref 0–44)
AST: 13 U/L — ABNORMAL LOW (ref 15–41)
Albumin: 3.6 g/dL (ref 3.5–5.0)
Alkaline Phosphatase: 66 U/L (ref 38–126)
Anion gap: 12 (ref 5–15)
BUN: 27 mg/dL — ABNORMAL HIGH (ref 8–23)
CO2: 22 mmol/L (ref 22–32)
Calcium: 8 mg/dL — ABNORMAL LOW (ref 8.9–10.3)
Chloride: 100 mmol/L (ref 98–111)
Creatinine, Ser: 0.74 mg/dL (ref 0.61–1.24)
GFR calc Af Amer: >60 mL/min
GFR calc non Af Amer: >60 mL/min
Glucose, Bld: 240 mg/dL — ABNORMAL HIGH (ref 70–99)
Potassium: 3.8 mmol/L (ref 3.5–5.1)
Sodium: 134 mmol/L — ABNORMAL LOW (ref 135–145)
Total Bilirubin: 1.2 mg/dL (ref 0.3–1.2)
Total Protein: 5.8 g/dL — ABNORMAL LOW (ref 6.5–8.1)

## 2019-04-24 LAB — LIPASE, BLOOD: Lipase: 17 U/L (ref 11–51)

## 2019-04-24 LAB — MAGNESIUM: Magnesium: 2 mg/dL (ref 1.7–2.4)

## 2019-04-24 LAB — CBC WITH DIFFERENTIAL/PLATELET
Abs Immature Granulocytes: 0.09 10*3/uL — ABNORMAL HIGH (ref 0.00–0.07)
Basophils Absolute: 0 10*3/uL (ref 0.0–0.1)
Basophils Relative: 0 %
Eosinophils Absolute: 0 10*3/uL (ref 0.0–0.5)
Eosinophils Relative: 0 %
HCT: 48.3 % (ref 39.0–52.0)
Hemoglobin: 15.7 g/dL (ref 13.0–17.0)
Immature Granulocytes: 1 %
Lymphocytes Relative: 10 %
Lymphs Abs: 1.1 10*3/uL (ref 0.7–4.0)
MCH: 26.4 pg (ref 26.0–34.0)
MCHC: 32.5 g/dL (ref 30.0–36.0)
MCV: 81.3 fL (ref 80.0–100.0)
Monocytes Absolute: 0.9 10*3/uL (ref 0.1–1.0)
Monocytes Relative: 8 %
Neutro Abs: 9.1 10*3/uL — ABNORMAL HIGH (ref 1.7–7.7)
Neutrophils Relative %: 81 %
Platelets: 166 10*3/uL (ref 150–400)
RBC: 5.94 MIL/uL — ABNORMAL HIGH (ref 4.22–5.81)
RDW: 14.5 % (ref 11.5–15.5)
WBC: 11.2 10*3/uL — ABNORMAL HIGH (ref 4.0–10.5)
nRBC: 0 % (ref 0.0–0.2)

## 2019-04-24 LAB — SARS CORONAVIRUS 2 BY RT PCR (HOSPITAL ORDER, PERFORMED IN ~~LOC~~ HOSPITAL LAB): SARS Coronavirus 2: NEGATIVE

## 2019-04-24 MED ORDER — ONDANSETRON HCL 4 MG/2ML IJ SOLN
4.0000 mg | Freq: Once | INTRAMUSCULAR | Status: AC
  Administered 2019-04-24: 4 mg via INTRAVENOUS
  Filled 2019-04-24: qty 2

## 2019-04-24 MED ORDER — SODIUM CHLORIDE 0.9 % IV BOLUS
1000.0000 mL | Freq: Once | INTRAVENOUS | Status: AC
  Administered 2019-04-24: 21:00:00 1000 mL via INTRAVENOUS

## 2019-04-24 MED ORDER — FAMOTIDINE IN NACL 20-0.9 MG/50ML-% IV SOLN
20.0000 mg | Freq: Once | INTRAVENOUS | Status: AC
  Administered 2019-04-24: 20 mg via INTRAVENOUS
  Filled 2019-04-24: qty 50

## 2019-04-24 MED ORDER — ONDANSETRON HCL 4 MG PO TABS: 4.0000 mg | ORAL_TABLET | Freq: Three times a day (TID) | ORAL | 0 refills | Status: DC | PRN

## 2019-04-24 MED ORDER — ONDANSETRON HCL 4 MG/2ML IJ SOLN
4.0000 mg | Freq: Once | INTRAMUSCULAR | Status: AC
  Administered 2019-04-25: 4 mg via INTRAVENOUS
  Filled 2019-04-24: qty 2

## 2019-04-24 NOTE — ED Provider Notes (Signed)
[facility] DEPT Provider Note   CSN: [id number] Arrival date & time: 04/24/19  1949     History   Chief Complaint Chief Complaint  Patient presents with  . Nausea  . Emesis    HPI Anikin Prosser is a 68 y.o. male.  He has a history of renal transplant and is on immune suppressants.  He is also diabetic.  He is complaining of 3 days of nausea and vomiting.  Denies any abdominal pain.  He says he feels dizzy and weak.  He was unable to take his rejection medicines today.  No fevers or chills no cough no chest pain.  He has been seen for this before and it is thought to be from hyperemesis associated with marijuana.  He admits to continue to use marijuana.  Does not use NSAIDs.     The history is provided by the patient.  Emesis Severity:  Severe Duration:  3 days Timing:  Intermittent Quality:  Unable to specify Progression:  Unchanged Chronicity:  Recurrent Relieved by:  Nothing Worsened by:  Nothing Ineffective treatments:  None tried Associated symptoms: no abdominal pain, no chills, no cough, no diarrhea, no fever, no headaches and no sore throat   Risk factors: diabetes   Risk factors: no suspect food intake and no travel to endemic areas     Past Medical History:  Diagnosis Date  . Anxiety   . CPAP (continuous positive airway pressure) dependence   . Diabetes mellitus without complication ([place])   . GERD (gastroesophageal reflux disease)   . Hypertension   . Legally blind in left eye, as defined in [place]   . PTSD (post-traumatic stress disorder)   . PVD (peripheral vascular disease) ([place]) 01/17/2015  . Renal disorder   . Sleep apnea   . Thyroid disease     Patient Active Problem List   Diagnosis Date Noted  . OSA on CPAP 09/13/2018  . Intractable nausea and vomiting 09/12/2018  . Kidney transplant recipient 09/12/2018  . DKA, type 1 ([place]) 01/05/2018  . Nausea and vomiting 10/19/2017  . Nausea & vomiting 10/18/2017  .  Hematemesis 07/24/2017  . Hypertensive urgency 07/24/2017  . Acute GI bleeding 07/24/2017  . Status post below knee amputation of left lower extremity ([place]) 02/22/2015  . S/P BKA (below knee amputation) unilateral ([place]) 02/20/2015  . Ischemic ulcer of toe ([place])   . Peripheral vascular disease ([place])   . PVD (peripheral vascular disease) ([place]) 01/17/2015  . Cellulitis and abscess of foot 01/13/2015  . Cellulitis and abscess of foot excluding toe 01/13/2015  . Diabetes mellitus without complication ([place]) 47/82/9562  . Leukocytosis 01/13/2015  . Diabetes mellitus with peripheral vascular disease ([place]) 12/19/2007  . DYSLIPIDEMIA 12/19/2007  . ANEMIA 12/19/2007  . ANXIETY 12/19/2007  . Essential hypertension 12/19/2007  . TRANSIENT ISCHEMIC ATTACK 12/19/2007  . PERIPHERAL VASCULAR DISEASE 12/19/2007  . RENAL FAILURE 12/19/2007    Past Surgical History:  Procedure Laterality Date  . ABDOMINAL AORTAGRAM N/A 01/21/2015   Procedure: ABDOMINAL Maxcine Ham;  Surgeon: Angelia Mould, MD;  Location: [facility] C[facility] LAB;  Service: Cardiovascular;  Laterality: N/A;  . AMPUTATION Left 01/23/2015   Procedure: FIRST RAY AMPUTATION/LEFT FOOT;  Surgeon: Newt Minion, MD;  Location: [facility];  Service: Orthopedics;  Laterality: Left;  . AMPUTATION Left 02/20/2015   Procedure: AMPUTATION BELOW KNEE;  Surgeon: Newt Minion, MD;  Location: [facility];  Service: Orthopedics;  Laterality: Left;  . AV FISTULA PLACEMENT    .  COLONOSCOPY    . COMBINED KIDNEY-PANCREAS TRANSPLANT Left 2009  . INCISION AND DRAINAGE ABSCESS Left 01/13/2015   Procedure: INCISION AND DRAINAGE OF LEFT FOOT FIRST RAY ABCESS;  Surgeon: Kathryne Hitchhristopher Y Blackman, MD;  [facility];  Service: Orthopedics;  Laterality: Left;  . LOWER EXTREMITY ANGIOGRAM Left 01/21/2015   Procedure: LOWER EXTREMITY ANGIOGRAM;  Surgeon: Chuck Hinthristopher S Dickson, MD;  [facility];  Service: Cardiovascular;  Laterality: Left;        Home Medications    Prior  to Admission medications   Medication Sig Start Date End Date Taking? Authorizing Provider  amLODipine (NORVASC) 10 MG tablet Take 10 mg by mouth daily.    [provider]  aspirin EC 81 MG tablet Take 81 mg by mouth daily.    [provider]  atorvastatin (LIPITOR) 80 MG tablet Take 40 mg by mouth at bedtime. 1/2 tablet daily (40mg )    [provider]  furosemide (LASIX) 20 MG tablet Take 1 tablet (20 mg total) by mouth daily. 07/28/17   Osvaldo ShipperKrishnan, Gokul, MD  insulin detemir (LEVEMIR) 100 UNIT/ML injection Inject 0.15 mLs (15 Units total) into the skin 2 (two) times daily. Patient taking differently: Inject 20 Units into the skin daily.  07/28/17   Osvaldo ShipperKrishnan, Gokul, MD  labetalol (NORMODYNE) 100 MG tablet Take 2 tablets (200 mg total) by mouth 2 (two) times daily. 07/28/17   Osvaldo ShipperKrishnan, Gokul, MD  lisinopril (PRINIVIL,ZESTRIL) 10 MG tablet Take 1 tablet (10 mg total) by mouth daily. Patient taking differently: Take 20 mg by mouth daily.  01/07/18   Burnadette PopAdhikari, Amrit, MD  metoCLOPramide (REGLAN) 5 MG tablet Take 1 tablet (5 mg total) by mouth 3 (three) times daily for 3 days. 10/20/17 01/05/26  Leatha GildingGherghe, Costin M, MD  mycophenolate (CELLCEPT) 250 MG capsule Take 1,000 mg by mouth 2 (two) times daily.    [provider]  ondansetron (ZOFRAN) 4 MG tablet Take 1 tablet (4 mg total) by mouth every 6 (six) hours as needed for nausea. 01/07/18   Burnadette PopAdhikari, Amrit, MD  pantoprazole (PROTONIX) 40 MG tablet Take 1 tablet (40 mg total) by mouth daily. 07/28/17   Osvaldo ShipperKrishnan, Gokul, MD  potassium chloride 20 MEQ TBCR Take 20 mEq by mouth daily. 01/07/18   Burnadette PopAdhikari, Amrit, MD  tacrolimus (PROGRAF) 5 MG capsule Take 5 mg by mouth 2 (two) times daily.    [provider]    Family History Family History  Problem Relation Age of Onset  . Hypertension Other     Social History Social History   Tobacco Use  . Smoking status: Former Games developermoker  . Smokeless tobacco: Never Used  .  Tobacco comment: quit i [id number]  Substance Use Topics  . Alcohol use: Yes    Comment: 1 beer rarely   . Drug use: No     Allergies   Patient has no known allergies.   Review of Systems Review of Systems  Constitutional: Negative for chills and fever.  HENT: Negative for sore throat.   Eyes: Negative for pain.  Respiratory: Negative for cough and shortness of breath.   Cardiovascular: Negative for chest pain.  Gastrointestinal: Positive for nausea and vomiting. Negative for abdominal pain and diarrhea.  Genitourinary: Negative for dysuria.  Musculoskeletal: Positive for back pain (chronic).  Skin: Negative for rash.  Neurological: Positive for dizziness and weakness. Negative for headaches.     Physical Exam Updated Vital Signs BP (!) 184/92 (BP Location: Left Arm)   Pulse (!) 120  Temp 99.2 F (37.3 C) (Oral)   Resp 19   SpO2 100% Comment: Simultaneous filing. User may not have seen previous data.  Physical Exam Vitals signs and nursing note reviewed.  Constitutional:      Appearance: He is well-developed.  HENT:     Head: Normocephalic and atraumatic.  Eyes:     Conjunctiva/sclera: Conjunctivae normal.  Neck:     Musculoskeletal: Neck supple.  Cardiovascular:     Rate and Rhythm: Regular rhythm. Tachycardia present.     Pulses: Normal pulses.     Heart sounds: No murmur.  Pulmonary:     Effort: Pulmonary effort is normal. No respiratory distress.     Breath sounds: Normal breath sounds.  Abdominal:     Palpations: Abdomen is soft.     Tenderness: There is no abdominal tenderness.  Musculoskeletal: Normal range of motion.     Right lower leg: No edema.     Left lower leg: No edema.  Skin:    General: Skin is warm and dry.     Capillary Refill: Capillary refill takes less than 2 seconds.  Neurological:     General: No focal deficit present.     Mental Status: He is alert and oriented to person, place, and time.      ED Treatments / Results  Labs  (all labs ordered are listed, but only abnormal results are displayed) Labs Reviewed  CBC WITH DIFFERENTIAL/PLATELET - Abnormal; Notable for the following components:      Result Value   WBC 11.2 (*)    RBC 5.94 (*)    Neutro Abs 9.1 (*)    Abs Immature Granulocytes 0.09 (*)    All other components within normal limits  COMPREHENSIVE METABOLIC PANEL - Abnormal; Notable for the following components:   Sodium 134 (*)    Glucose, Bld 240 (*)    BUN 27 (*)    Calcium 8.0 (*)    Total Protein 5.8 (*)    AST 13 (*)    All other components within normal limits  SARS CORONAVIRUS 2 (HOSPITAL ORDER[facility])  LIPASE, BLOOD  MAGNESIUM  TACROLIMUS LEVEL    EKG EKG Interpretation  Date/Time:  Monday April 24 2019 20:12:24 EDT Ventricular Rate:  119 PR Interval:    QRS Duration: 84 QT Interval:  312 QTC Calculation: 439 R Axis:   84 Text Interpretation:  Sinus tachycardia Ventricular premature complex Consider right atrial enlargement Borderline right axis deviation Consider left ventricular hypertrophy similar to prior 12/19 Confirmed by Meridee ScoreButler, Maksim Peregoy [phone]([id number]) on 04/24/2019 9:55:55 PM Also confirmed by Meridee ScoreButler, Ambreen Tufte [phone]([id number]), editor Elita QuickWatlington, Beverly ([id number])  on 04/25/2019 6:56:20 AM   Radiology No results found.  Procedures Procedures (including critical care time)  Medications Ordered in ED Medications  sodium chloride 0.9 % bolus 1,000 mL (0 mLs Intravenous Stopped 04/24/19 2318)  ondansetron (ZOFRAN) injection 4 mg (4 mg Intravenous Given 04/24/19 2118)  famotidine (PEPCID) IVPB 20 mg premix (0 mg Intravenous Stopped 04/24/19 2159)  ondansetron (ZOFRAN) injection 4 mg (4 mg Intravenous Given 04/25/19 0015)     Initial Impression / Assessment and Plan / ED Course  I have reviewed the triage vital signs and the nursing notes.  Pertinent labs & imaging results that were available during my care of the patient were reviewed by me and considered in  my medical decision making (see chart for details).  Clinical Course as of Apr 24 1308  Mon Apr 24, 2019  [id number] 68 year old male with history of hypertension diabetes and renal transplant here with 3 days of vomiting.  He said he was unable to hold down his meds today.  He denies any pain although he looks uncomfortable.  He is fortunately no softer retching to a bag.  Differential includes gastroenteritis, gastroparesis, cannabis hyperemesis syndrome, obstruction, metabolic derangement   [MB]  2243 Reevaluated patient, he is resting.  Blood pressures are still quite high but on review of prior admissions similar.  During prior admissions there was some concern for QTC being prolonged and limitations on what medications we can give him for his nausea and vomiting.  Currently he is reading out of the QTC of 439.  That will need to be followed.   [MB]  2256 Patient's lab work does not look too bad.  I checked on him and he said he is feeling a little bit better.  Says he thinks he might want to be able to go home.  We will p.o. trial in here   [MB]    Clinical Course User Index [MB] Terrilee FilesButler, Tameaka Eichhorn C, MD   Ascencion DikeClarence Shor was evaluated in Emergency Department on 04/24/2019 for the symptoms described in the history of present illness. He was evaluated in the context of the global COVID-19 pandemic, which necessitated consideration that the patient might be at risk for infection with the SARS-CoV-2 virus that causes COVID-19. Institutional protocols and algorithms that pertain to the evaluation of patients at risk for COVID-19 are in a state of rapid change based on information released by regulatory bodies including the CDC and federal and state organizations. These policies and algorithms were followed during the patient's care in the ED.      Final Clinical Impressions(s) / ED Diagnoses   Final diagnoses:  Non-intractable vomiting with nausea, unspecified vomiting type    ED Discharge Orders     None       Terrilee FilesButler, Doryan Bahl C, MD 04/25/19 1309

## 2019-04-24 NOTE — ED Provider Notes (Signed)
Patient left a change of shift to see how he does with his oral challenge.  Patient states he was able to drink without vomiting however he still has some mild nausea.  He was not sure if he needed another dose of nausea medication or not.  He does want to try to go home.  He was given a second dose of Zofran while he still had his IV in place.   Rolland Porter, MD 04/24/19 [phone]

## 2019-04-24 NOTE — ED Notes (Signed)
Patient given PO fluids per MD for fluid challenge.

## 2019-04-24 NOTE — Discharge Instructions (Addendum)
Drink plenty of fluids.  Use the Zofran for your nausea and vomiting.  Return if you are unable to take your medications feel worse.

## 2019-04-24 NOTE — ED Triage Notes (Signed)
Arrives via EMS from home. C/C vomiting x3 days, unable to keep down PO intake, dizziness with ambulation. Denies syncopal episodes, fever, chest pain. EMS gave 4 mg Zofran. CBG 313. 325 mL NS given as well.

## 2019-04-28 LAB — TACROLIMUS LEVEL: Tacrolimus (FK506) - LabCorp: NOT DETECTED ng/mL (ref 2.0–20.0)

## 2019-06-12 ENCOUNTER — Encounter (HOSPITAL_COMMUNITY): Payer: Self-pay

## 2019-06-12 ENCOUNTER — Other Ambulatory Visit: Payer: Self-pay

## 2019-06-12 ENCOUNTER — Ambulatory Visit (HOSPITAL_COMMUNITY)
Admission: EM | Admit: 2019-06-12 | Discharge: 2019-06-12 | Disposition: A | Discharge status: Home / Self Care | Payer: Medicare Other | Attending: Family Medicine | Admitting: Family Medicine

## 2019-06-12 DIAGNOSIS — E119 Type 2 diabetes mellitus without complications: Secondary | ICD-10-CM

## 2019-06-12 DIAGNOSIS — Z89512 Acquired absence of left leg below knee: Secondary | ICD-10-CM

## 2019-06-12 DIAGNOSIS — S90521A Blister (nonthermal), right ankle, initial encounter: Secondary | ICD-10-CM | POA: Diagnosis not present

## 2019-06-12 DIAGNOSIS — I10 Essential (primary) hypertension: Secondary | ICD-10-CM

## 2019-06-12 DIAGNOSIS — T148XXA Other injury of unspecified body region, initial encounter: Secondary | ICD-10-CM

## 2019-06-12 MED ORDER — SILVER SULFADIAZINE 1 % EX CREA
TOPICAL_CREAM | CUTANEOUS | Status: AC
  Filled 2019-06-12: qty 85

## 2019-06-12 MED ORDER — SILVER SULFADIAZINE 1 % EX CREA: 1.0000 "application " | TOPICAL_CREAM | Freq: Every day | CUTANEOUS | 0 refills | Status: DC

## 2019-06-12 NOTE — Discharge Instructions (Signed)
Wash area once a day and apply antibiotic ointment and then a gauze dressing Return promptly for any increasing pain , redness

## 2019-06-12 NOTE — ED Provider Notes (Signed)
[facility]    CSN: [id number] Arrival date & time: 06/12/19  1744      History   Chief Complaint Chief Complaint  Patient presents with  . Abscess    Right Ankle    HPI [name] is a 68 y.o. male.   HPI  Patient is a diabetic.  He has had an amputation of his left leg.  He found a large blister on his right ankle this morning and is worried about infection.  He is here to have it evaluated.  There is no pain.  No trauma.  He states that his diabetes has been well managed.  Past Medical History:  Diagnosis Date  . Anxiety   . CPAP (continuous positive airway pressure) dependence   . Diabetes mellitus without complication ([facility])   . GERD (gastroesophageal reflux disease)   . Hypertension   . Legally blind in left eye, as defined in [place]   . PTSD (post-traumatic stress disorder)   . PVD (peripheral vascular disease) ([facility]) 01/17/2015  . Renal disorder   . Sleep apnea   . Thyroid disease     Patient Active Problem List   Diagnosis Date Noted  . OSA on CPAP 09/13/2018  . Intractable nausea and vomiting 09/12/2018  . Kidney transplant recipient 09/12/2018  . DKA, type 1 ([facility]) 01/05/2018  . Nausea and vomiting 10/19/2017  . Nausea & vomiting 10/18/2017  . Hematemesis 07/24/2017  . Hypertensive urgency 07/24/2017  . Acute GI bleeding 07/24/2017  . 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 S/P BKA (below knee amputation) unilateral ([facility]) 02/20/2015  . Ischemic ulcer of toe ([facility])   . Peripheral vascular disease ([facility])   . PVD (peripheral vascular disease) ([facility]) 01/17/2015  . Cellulitis and abscess of foot 01/13/2015  . Cellulitis and abscess of foot excluding toe 01/13/2015  . Diabetes mellitus without complication ([facility]) 01/13/2015  . Leukocytosis 01/13/2015  . Diabetes mellitus with peripheral vascular disease ([facility]) 12/19/2007  . DYSLIPIDEMIA 12/19/2007  . ANEMIA 12/19/2007  . ANXIETY 12/19/2007  . Essential  hypertension 12/19/2007  . TRANSIENT ISCHEMIC ATTACK 12/19/2007  . PERIPHERAL VASCULAR DISEASE 12/19/2007  . RENAL FAILURE 12/19/2007    Past Surgical History:  Procedure Laterality Date  . ABDOMINAL AORTAGRAM N/A 01/21/2015   Procedure: ABDOMINAL Ronny FlurryAORTAGRAM;  Surgeon: Chuck Hinthristopher S Dickson, MD;  [facility];  Service: Cardiovascular;  Laterality: N/A;  . AMPUTATION Left 01/23/2015   Procedure: FIRST RAY AMPUTATION/LEFT FOOT;  Surgeon: Nadara MustardMarcus Duda V, MD;  Location: [facility] OR;  Service: Orthopedics;  Laterality: Left;  . AMPUTATION Left 02/20/2015   Procedure: AMPUTATION BELOW KNEE;  Surgeon: Nadara MustardMarcus Duda V, MD;  Location: [facility] OR;  Service: Orthopedics;  Laterality: Left;  . AV FISTULA PLACEMENT    . COLONOSCOPY    . COMBINED KIDNEY-PANCREAS TRANSPLANT Left 2009  . INCISION AND DRAINAGE ABSCESS Left 01/13/2015   Procedure: INCISION AND DRAINAGE OF LEFT FOOT FIRST RAY ABCESS;  Surgeon: Kathryne Hitchhristopher Y Blackman, MD;  [facility] OR;  Service: Orthopedics;  Laterality: Left;  . LOWER EXTREMITY ANGIOGRAM Left 01/21/2015   Procedure: LOWER EXTREMITY ANGIOGRAM;  Surgeon: Chuck Hinthristopher S Dickson, MD;  [facility];  Service: Cardiovascular;  Laterality: Left;       Home Medications    Prior to Admission medications   Medication Sig Start Date End Date Taking? Authorizing Provider  amLODipine (NORVASC) 10 MG tablet Take 10 mg by mouth daily.    [provider]  aspirin EC 81 MG tablet Take 81 mg by mouth daily.    [provider]  atorvastatin (LIPITOR) 80 MG tablet Take 40 mg by mouth at bedtime. 1/2 tablet daily (40mg )    [provider]  furosemide (LASIX) 20 MG tablet Take 1 tablet (20 mg total) by mouth daily. 07/28/17   Bonnielee Haff, MD  insulin detemir (LEVEMIR) 100 UNIT/ML injection Inject 0.15 mLs (15 Units total) into the skin 2 (two) times daily. Patient taking differently: Inject 20 Units into the skin daily.  07/28/17   Bonnielee Haff, MD   labetalol (NORMODYNE) 100 MG tablet Take 2 tablets (200 mg total) by mouth 2 (two) times daily. 07/28/17   Bonnielee Haff, MD  lisinopril (PRINIVIL,ZESTRIL) 10 MG tablet Take 1 tablet (10 mg total) by mouth daily. Patient taking differently: Take 20 mg by mouth daily.  01/07/18   Shelly Coss, MD  metoCLOPramide (REGLAN) 5 MG tablet Take 1 tablet (5 mg total) by mouth 3 (three) times daily for 3 days. Patient not taking: Reported on 04/24/2019 10/20/17 01/05/26  Caren Griffins, MD  mycophenolate (CELLCEPT) 250 MG capsule Take 1,000 mg by mouth 2 (two) times daily.    [provider]  ondansetron (ZOFRAN) 4 MG tablet Take 1 tablet (4 mg total) by mouth every 8 (eight) hours as needed for nausea or vomiting. 04/24/19   Rolland Porter, MD  pantoprazole (PROTONIX) 40 MG tablet Take 1 tablet (40 mg total) by mouth daily. 07/28/17   Bonnielee Haff, MD  potassium chloride 20 MEQ TBCR Take 20 mEq by mouth daily. 01/07/18   Shelly Coss, MD  silver sulfADIAZINE (SILVADENE) 1 % cream Apply 1 application topically daily. 06/12/19   Raylene Everts, MD  tacrolimus (PROGRAF) 5 MG capsule Take 5 mg by mouth 2 (two) times daily.    [provider]    Family History Family History  Problem Relation Age of Onset  . Hypertension Other     Social History Social History   Tobacco Use  . Smoking status: Former Research scientist (life sciences)  . Smokeless tobacco: Never Used  . Tobacco comment: quit [id number]  Substance Use Topics  . Alcohol use: Yes    Comment: 1 beer rarely   . Drug use: No     Allergies   Patient has no known allergies.   Review of Systems Review of Systems  Constitutional: Negative for chills and fever.  HENT: Negative for ear pain and sore throat.   Eyes: Negative for pain and visual disturbance.  Respiratory: Negative for cough and shortness of breath.   Cardiovascular: Negative for chest pain and palpitations.  Gastrointestinal: Negative for abdominal pain and vomiting.   Genitourinary: Negative for dysuria and hematuria.  Musculoskeletal: Negative for arthralgias and back pain.  Skin: Positive for wound. Negative for color change and rash.  Neurological: Negative for seizures and syncope.  All other systems reviewed and are negative.    Physical Exam Triage Vital Signs ED Triage Vitals  Enc Vitals Group     BP 06/12/19 1836 (!) 162/62     Pulse Rate 06/12/19 1836 72     Resp 06/12/19 1836 18     Temp 06/12/19 1836 97.8 F (36.6 C)     Temp Source 06/12/19 1836 Temporal     SpO2 06/12/19 1836 94 %     Weight --      Height --      Head Circumference --      Peak Flow --  Pain Score 06/12/19 1840 0     Pain Loc --      Pain Edu? --      Excl. in GC? --    No data found.  Updated Vital Signs BP (!) 162/62 (BP Location: Left Arm)   Pulse 72   Temp 97.8 F (36.6 C) (Temporal)   Resp 18   SpO2 94%       Physical Exam Constitutional:      General: He is not in acute distress.    Appearance: He is well-developed.  HENT:     Head: Normocephalic and atraumatic.  Eyes:     Conjunctiva/sclera: Conjunctivae normal.     Pupils: Pupils are equal, round, and reactive to light.  Neck:     Musculoskeletal: Normal range of motion.  Cardiovascular:     Rate and Rhythm: Normal rate.  Pulmonary:     Effort: Pulmonary effort is normal. No respiratory distress.  Abdominal:     General: There is no distension.     Palpations: Abdomen is soft.  Musculoskeletal: Normal range of motion.  Skin:    General: Skin is warm and dry.     Comments: On the medial ankle anterior to the medial malleolus there is a large fluid-filled vesicle that measures 3 x 5 cm.  It is soft.  Fluid is clear.  There is no surrounding erythema or tenderness to indicate infection  Neurological:     Mental Status: He is alert.      UC Treatments / Results  Labs (all labs ordered are listed, but only abnormal results are displayed) Labs Reviewed - No data to display   EKG   Radiology No results found.  Procedures Procedures (including critical care time)  Medications Ordered in UC Medications  silver sulfADIAZINE (SILVADENE) 1 % cream (has no administration in time range)    Initial Impression / Assessment and Plan / UC Course  I have reviewed the triage vital signs and the nursing notes.  Pertinent labs & imaging results that were available during my care of the patient were reviewed by me and considered in my medical decision making (see chart for details).     Wound care as discussed* Final Clinical Impressions(s) / UC Diagnoses   Final diagnoses:  Blister     Discharge Instructions     Wash area once a day and apply antibiotic ointment and then a gauze dressing Return promptly for any increasing pain , redness   ED Prescriptions    Medication Sig Dispense Auth. Provider   silver sulfADIAZINE (SILVADENE) 1 % cream Apply 1 application topically daily. 50 g Eustace Moore, MD     Controlled Substance Prescriptions [place] Controlled Substance Registry consulted? Not Applicable   Eustace Moore, MD 06/12/19 Kristopher [place]

## 2019-06-12 NOTE — ED Triage Notes (Signed)
Pt presents with abscess on right ankle that his wife noticed on Saturday, no complaints of pain.  Pt is diabetic

## 2019-10-30 ENCOUNTER — Emergency Department (HOSPITAL_COMMUNITY): Payer: No Typology Code available for payment source

## 2019-10-30 ENCOUNTER — Encounter (HOSPITAL_COMMUNITY): Payer: Self-pay

## 2019-10-30 ENCOUNTER — Emergency Department (HOSPITAL_COMMUNITY)
Admission: EM | Admit: 2019-10-30 | Discharge: 2019-10-30 | Disposition: A | Discharge status: Home / Self Care | Payer: No Typology Code available for payment source | Attending: Emergency Medicine | Admitting: Emergency Medicine

## 2019-10-30 DIAGNOSIS — Z940 Kidney transplant status: Secondary | ICD-10-CM | POA: Diagnosis not present

## 2019-10-30 DIAGNOSIS — E119 Type 2 diabetes mellitus without complications: Secondary | ICD-10-CM | POA: Insufficient documentation

## 2019-10-30 DIAGNOSIS — I10 Essential (primary) hypertension: Secondary | ICD-10-CM | POA: Insufficient documentation

## 2019-10-30 DIAGNOSIS — R1032 Left lower quadrant pain: Secondary | ICD-10-CM | POA: Insufficient documentation

## 2019-10-30 DIAGNOSIS — R109 Unspecified abdominal pain: Secondary | ICD-10-CM

## 2019-10-30 DIAGNOSIS — Z9483 Pancreas transplant status: Secondary | ICD-10-CM | POA: Insufficient documentation

## 2019-10-30 DIAGNOSIS — Z87891 Personal history of nicotine dependence: Secondary | ICD-10-CM | POA: Diagnosis not present

## 2019-10-30 DIAGNOSIS — R52 Pain, unspecified: Secondary | ICD-10-CM | POA: Diagnosis not present

## 2019-10-30 DIAGNOSIS — R1030 Lower abdominal pain, unspecified: Secondary | ICD-10-CM | POA: Diagnosis not present

## 2019-10-30 DIAGNOSIS — R1111 Vomiting without nausea: Secondary | ICD-10-CM | POA: Diagnosis not present

## 2019-10-30 LAB — COMPREHENSIVE METABOLIC PANEL
ALT: 18 U/L (ref 0–44)
AST: 14 U/L — ABNORMAL LOW (ref 15–41)
Albumin: 5.1 g/dL — ABNORMAL HIGH (ref 3.5–5.0)
Alkaline Phosphatase: 81 U/L (ref 38–126)
Anion gap: 10 (ref 5–15)
BUN: 17 mg/dL (ref 8–23)
CO2: 27 mmol/L (ref 22–32)
Calcium: 9.7 mg/dL (ref 8.9–10.3)
Chloride: 105 mmol/L (ref 98–111)
Creatinine, Ser: 0.68 mg/dL (ref 0.61–1.24)
GFR calc Af Amer: >60 mL/min (ref >60)
GFR calc non Af Amer: >60 mL/min (ref >60)
Glucose, Bld: 138 mg/dL — ABNORMAL HIGH (ref 70–99)
Potassium: 3.7 mmol/L (ref 3.5–5.1)
Sodium: 142 mmol/L (ref 135–145)
Total Bilirubin: 1 mg/dL (ref 0.3–1.2)
Total Protein: 8 g/dL (ref 6.5–8.1)

## 2019-10-30 LAB — CBC
HCT: 46.8 % (ref 39.0–52.0)
Hemoglobin: 14.9 g/dL (ref 13.0–17.0)
MCH: 26.7 pg (ref 26.0–34.0)
MCHC: 31.8 g/dL (ref 30.0–36.0)
MCV: 83.9 fL (ref 80.0–100.0)
Platelets: 150 10*3/uL (ref 150–400)
RBC: 5.58 MIL/uL (ref 4.22–5.81)
RDW: 14.6 % (ref 11.5–15.5)
WBC: 7.4 10*3/uL (ref 4.0–10.5)
nRBC: 0 % (ref 0.0–0.2)

## 2019-10-30 LAB — URINALYSIS, ROUTINE W REFLEX MICROSCOPIC
Bilirubin Urine: NEGATIVE
Glucose, UA: NEGATIVE mg/dL
Hgb urine dipstick: NEGATIVE
Ketones, ur: NEGATIVE mg/dL
Leukocytes,Ua: NEGATIVE
Nitrite: NEGATIVE
Protein, ur: NEGATIVE mg/dL
Specific Gravity, Urine: 1.018 (ref 1.005–1.030)
pH: 6 (ref 5.0–8.0)

## 2019-10-30 LAB — LIPASE, BLOOD: Lipase: 25 U/L (ref 11–51)

## 2019-10-30 MED ORDER — IOHEXOL 350 MG/ML SOLN
100.0000 mL | Freq: Once | INTRAVENOUS | Status: AC | PRN
  Administered 2019-10-30: 100 mL via INTRAVENOUS

## 2019-10-30 MED ORDER — SODIUM CHLORIDE (PF) 0.9 % IJ SOLN
INTRAMUSCULAR | Status: AC
  Filled 2019-10-30: qty 50

## 2019-10-30 MED ORDER — HYDROMORPHONE HCL 1 MG/ML IJ SOLN
1.0000 mg | Freq: Once | INTRAMUSCULAR | Status: AC
  Administered 2019-10-30: 1 mg via INTRAVENOUS
  Filled 2019-10-30: qty 1

## 2019-10-30 MED ORDER — SODIUM CHLORIDE 0.9% FLUSH: 3.0000 mL | Freq: Once | INTRAVENOUS | Status: DC

## 2019-10-30 MED ORDER — SODIUM CHLORIDE 0.9 % IV BOLUS
1000.0000 mL | Freq: Once | INTRAVENOUS | Status: AC
  Administered 2019-10-30: 1000 mL via INTRAVENOUS

## 2019-10-30 NOTE — ED Triage Notes (Addendum)
Patient arrived via [facility] from home C/O left flank pain radiating down leg that started this morning. Patient got up to go to the bathroom this morning and had 1 episode of emesis.   Denies nausea at this time  Denies dysuria or hematuria  Denies trauma  Denies abdominal pain   Patient states he has been moving and lifting at home Patient thinks he may have pulled a muscle but unsure why he vomited this morning.   Hx. Kidney transplant on left over 10 years ago  A/Ox4 Ambulatory with ems

## 2019-10-30 NOTE — ED Provider Notes (Signed)
Patient care assumed at 1630. Patient with history of left renal transplant here for evaluation of abrupt onset left flank pain radiating to LLE. CTA and UA pending.  CTA negative for dissection, obstruction, stone. Urinalysis not consistent with UTI. On assessment patient states his pain is resolved. He has soft and nontender abdomen, 2+ femoral pulses bilaterally. His five out of five strength and bilateral lower extremities with sensation to light touch intact and bilateral lower extremities. He has a left lower extremity BKA. Discussed with patient unclear source of symptoms. Discussed importance of close return precautions. Outpatient follow-up discussed.   [name], MD 10/30/19 1745

## 2019-10-30 NOTE — ED Notes (Signed)
An After Visit Summary was printed and given to the patient. Discharge instructions given and no further questions at this time. Pt ambulatory at discharge with no difficulty.  

## 2019-10-30 NOTE — ED Provider Notes (Signed)
[facility]-EMERGENCY DEPT [facility] Emergency Department Provider Note MRN:  [id number]  Arrival date & time: 10/30/19     Chief Complaint   Flank Pain, Leg Pain, and Nausea   History of Present Illness   [name] is a 69 y.o. year-old male with a history of diabetes, kidney transplant presenting to the ED with chief complaint of flank pain.  Acute pain to the left flank this morning, severe, associated with single episode nausea vomiting, pain radiated down the abdomen into the testicles.  Pain was 10 out of 10, currently 5 out of 10.  Denies chest pain or shortness of breath.  Review of Systems  A complete 10 system review of systems was obtained and all systems are negative except as noted in the HPI and PMH.   Patient's Health History    Past Medical History:  Diagnosis Date  . Anxiety   . CPAP (continuous positive airway pressure) dependence   . Diabetes mellitus without complication (HCC)   . GERD (gastroesophageal reflux disease)   . Hypertension   . Legally blind in left eye, as defined in [place]   . PTSD (post-traumatic stress disorder)   . PVD (peripheral vascular disease) (HCC) 01/17/2015  . Renal disorder   . Sleep apnea   . Thyroid disease     Past Surgical History:  Procedure Laterality Date  . ABDOMINAL AORTAGRAM N/A 01/21/2015   Procedure: ABDOMINAL Ronny Flurry;  Surgeon: Chuck Hint, MD;  Location: [facility] CATH LAB;  Service: Cardiovascular;  Laterality: N/A;  . AMPUTATION Left 01/23/2015   Procedure: FIRST RAY AMPUTATION/LEFT FOOT;  Surgeon: Nadara Mustard, MD;  Location: [facility] OR;  Service: Orthopedics;  Laterality: Left;  . AMPUTATION Left 02/20/2015   Procedure: AMPUTATION BELOW KNEE;  Surgeon: Nadara Mustard, MD;  Location: [facility] OR;  Service: Orthopedics;  Laterality: Left;  . AV FISTULA PLACEMENT    . COLONOSCOPY    . COMBINED KIDNEY-PANCREAS TRANSPLANT Left 2009  . INCISION AND DRAINAGE ABSCESS Left 01/13/2015   Procedure: INCISION AND DRAINAGE OF LEFT  FOOT FIRST RAY ABCESS;  Surgeon: Kathryne Hitch, MD;  Location: [facility] OR;  Service: Orthopedics;  Laterality: Left;  . LOWER EXTREMITY ANGIOGRAM Left 01/21/2015   Procedure: LOWER EXTREMITY ANGIOGRAM;  Surgeon: Chuck Hint, MD;  Location: [facility] CATH LAB;  Service: Cardiovascular;  Laterality: Left;    Family History  Problem Relation Age of Onset  . Hypertension Other     Social History   Socioeconomic History  . Marital status: Married    Spouse name: Not on file  . Number of children: Not on file  . Years of education: Not on file  . Highest education level: Not on file  Occupational History  . Not on file  Tobacco Use  . Smoking status: Former Games developer  . Smokeless tobacco: Never Used  . Tobacco comment: quit i [id number]  Substance and Sexual Activity  . Alcohol use: Yes    Comment: 1 beer rarely   . Drug use: No  . Sexual activity: Not on file  Other Topics Concern  . Not on file  Social History Narrative  . Not on file   Social Determinants of Health   Financial Resource Strain:   . Difficulty of Paying Living Expenses: Not on file  Food Insecurity:   . Worried About Programme researcher, broadcasting/film/video in the Last Year: Not on file  . Ran Out of Food in the Last Year: Not on file  Transportation Needs:   .  Lack of Transportation (Medical): Not on file  . Lack of Transportation (Non-Medical): Not on file  Physical Activity:   . Days of Exercise per Week: Not on file  . Minutes of Exercise per Session: Not on file  Stress:   . Feeling of Stress : Not on file  Social Connections:   . Frequency of Communication with Friends and Family: Not on file  . Frequency of Social Gatherings with Friends and Family: Not on file  . Attends Religious Services: Not on file  . Active Member of Clubs or Organizations: Not on file  . Attends Archivist Meetings: Not on file  . Marital Status: Not on file  Intimate Partner Violence:   . Fear of Current or Ex-Partner: Not on  file  . Emotionally Abused: Not on file  . Physically Abused: Not on file  . Sexually Abused: Not on file     Physical Exam   Vitals:   10/30/19 1313 10/30/19 1606  BP: (!) 151/82 (!) 159/77  Pulse:  80  Resp:  18  Temp:    SpO2:  99%    CONSTITUTIONAL: Well-appearing, NAD NEURO:  Alert and oriented x 3, no focal deficits EYES:  eyes equal and reactive ENT/NECK:  no LAD, no JVD CARDIO: Regular rate, well-perfused, normal S1 and S2 PULM:  CTAB no wheezing or rhonchi GI/GU:  normal bowel sounds, non-distended, non-tender MSK/SPINE:  No gross deformities, no edema SKIN:  no rash, atraumatic PSYCH:  Appropriate speech and behavior  *Additional and/or pertinent findings included in MDM below  Diagnostic and Interventional Summary    EKG Interpretation  Date/Time:    Ventricular Rate:    PR Interval:    QRS Duration:   QT Interval:    QTC Calculation:   R Axis:     Text Interpretation:        Cardiac Monitoring Interpretation:  Labs Reviewed  COMPREHENSIVE METABOLIC PANEL - Abnormal; Notable for the following components:      Result Value   Glucose, Bld 138 (*)    Albumin 5.1 (*)    AST 14 (*)    All other components within normal limits  LIPASE, BLOOD  CBC  URINALYSIS, ROUTINE W REFLEX MICROSCOPIC    CT Angio Abd/Pel W and/or Wo Contrast  Final Result      Medications  sodium chloride flush (NS) 0.9 % injection 3 mL (0 mLs Intravenous Hold 10/30/19 1413)  sodium chloride (PF) 0.9 % injection (has no administration in time range)  sodium chloride 0.9 % bolus 1,000 mL (0 mLs Intravenous Stopped 10/30/19 1644)  HYDROmorphone (DILAUDID) injection 1 mg (1 mg Intravenous Given 10/30/19 1452)  iohexol (OMNIPAQUE) 350 MG/ML injection 100 mL (100 mLs Intravenous Contrast Given 10/30/19 1607)     Procedures  /  Critical Care Procedures  ED Course and Medical Decision Making  I have reviewed the triage vital signs, the nursing notes, and pertinent available records  from the EMR.  Pertinent labs & imaging results that were available during my care of the patient were reviewed by me and considered in my medical decision making (see below for details).     Acute backslash flank pain radiating to the testicles, significantly hypertensive on my evaluation in the 185 systolic, has never had kidney stone before and has a transplanted kidney.  Considering aortic dissection, will need CT to exclude.  Still awaiting CT imaging and urinalysis, labs are reassuring, normal kidney function, I do not feel that additional imaging  of the transplanted kidney is necessary if the CT is without acute process.  If normal CT scan, would suspect MSK etiology.  Signed out to oncoming provider at shift change.  Elmer Sow. Pilar Plate, MD [facility] Emergency Medicine [facility] [email or web address]  Final Clinical Impressions(s) / ED Diagnoses     ICD-10-CM   1. Flank pain  R10.9     ED Discharge Orders    None       Discharge Instructions Discussed with and Provided to Patient:   Discharge Instructions   None       Sabas Sous, MD 10/30/19 1704

## 2019-10-30 NOTE — ED Notes (Signed)
Pt encouraged to give UA. Pt informed to hit call light when sample is ready.

## 2019-10-30 NOTE — Discharge Instructions (Signed)
The cause of your pain was not identified today. You can use Lidoderm or salonpas patches, available over-the-counter for pain. Get rechecked immediately if you develop fevers, numbness or weakness in your legs or difficulty with urination.

## 2020-01-24 ENCOUNTER — Inpatient Hospital Stay (HOSPITAL_COMMUNITY)
Admission: EM | Admit: 2020-01-24 | Discharge: 2020-01-27 | DRG: 638 | Disposition: A | Discharge status: Home / Self Care | Payer: Medicare Other | Attending: Family Medicine | Admitting: Family Medicine

## 2020-01-24 ENCOUNTER — Other Ambulatory Visit: Payer: Self-pay

## 2020-01-24 ENCOUNTER — Encounter (HOSPITAL_COMMUNITY): Payer: Self-pay | Admitting: Emergency Medicine

## 2020-01-24 DIAGNOSIS — E1010 Type 1 diabetes mellitus with ketoacidosis without coma: Secondary | ICD-10-CM | POA: Diagnosis not present

## 2020-01-24 DIAGNOSIS — E785 Hyperlipidemia, unspecified: Secondary | ICD-10-CM | POA: Diagnosis present

## 2020-01-24 DIAGNOSIS — Z91138 Patient's unintentional underdosing of medication regimen for other reason: Secondary | ICD-10-CM

## 2020-01-24 DIAGNOSIS — T383X6A Underdosing of insulin and oral hypoglycemic [antidiabetic] drugs, initial encounter: Secondary | ICD-10-CM | POA: Diagnosis present

## 2020-01-24 DIAGNOSIS — E1051 Type 1 diabetes mellitus with diabetic peripheral angiopathy without gangrene: Secondary | ICD-10-CM | POA: Diagnosis present

## 2020-01-24 DIAGNOSIS — Z20822 Contact with and (suspected) exposure to covid-19: Secondary | ICD-10-CM | POA: Diagnosis present

## 2020-01-24 DIAGNOSIS — Z89512 Acquired absence of left leg below knee: Secondary | ICD-10-CM

## 2020-01-24 DIAGNOSIS — F1210 Cannabis abuse, uncomplicated: Secondary | ICD-10-CM | POA: Diagnosis present

## 2020-01-24 DIAGNOSIS — Z940 Kidney transplant status: Secondary | ICD-10-CM

## 2020-01-24 DIAGNOSIS — Z7982 Long term (current) use of aspirin: Secondary | ICD-10-CM

## 2020-01-24 DIAGNOSIS — E1110 Type 2 diabetes mellitus with ketoacidosis without coma: Secondary | ICD-10-CM | POA: Diagnosis present

## 2020-01-24 DIAGNOSIS — Z87891 Personal history of nicotine dependence: Secondary | ICD-10-CM

## 2020-01-24 DIAGNOSIS — R112 Nausea with vomiting, unspecified: Secondary | ICD-10-CM | POA: Diagnosis present

## 2020-01-24 DIAGNOSIS — I10 Essential (primary) hypertension: Secondary | ICD-10-CM | POA: Diagnosis present

## 2020-01-24 DIAGNOSIS — Z79899 Other long term (current) drug therapy: Secondary | ICD-10-CM

## 2020-01-24 DIAGNOSIS — Z794 Long term (current) use of insulin: Secondary | ICD-10-CM

## 2020-01-24 DIAGNOSIS — H548 Legal blindness, as defined in USA: Secondary | ICD-10-CM | POA: Diagnosis present

## 2020-01-24 LAB — COMPREHENSIVE METABOLIC PANEL
ALT: 15 U/L (ref 0–44)
AST: 12 U/L — ABNORMAL LOW (ref 15–41)
Albumin: 4.7 g/dL (ref 3.5–5.0)
Alkaline Phosphatase: 117 U/L (ref 38–126)
Anion gap: 18 — ABNORMAL HIGH (ref 5–15)
BUN: 28 mg/dL — ABNORMAL HIGH (ref 8–23)
CO2: 22 mmol/L (ref 22–32)
Calcium: 9.7 mg/dL (ref 8.9–10.3)
Chloride: 94 mmol/L — ABNORMAL LOW (ref 98–111)
Creatinine, Ser: 1.24 mg/dL (ref 0.61–1.24)
GFR calc Af Amer: >60 mL/min (ref >60)
GFR calc non Af Amer: 60 mL/min — ABNORMAL LOW (ref >60)
Glucose, Bld: 340 mg/dL — ABNORMAL HIGH (ref 70–99)
Potassium: 4 mmol/L (ref 3.5–5.1)
Sodium: 134 mmol/L — ABNORMAL LOW (ref 135–145)
Total Bilirubin: 1.4 mg/dL — ABNORMAL HIGH (ref 0.3–1.2)
Total Protein: 7.5 g/dL (ref 6.5–8.1)

## 2020-01-24 LAB — URINALYSIS, ROUTINE W REFLEX MICROSCOPIC
Bacteria, UA: NONE SEEN
Bilirubin Urine: NEGATIVE
Glucose, UA: >=500 mg/dL — AB
Ketones, ur: 80 mg/dL — AB
Leukocytes,Ua: NEGATIVE
Nitrite: NEGATIVE
Protein, ur: >=300 mg/dL — AB
Specific Gravity, Urine: 1.027 (ref 1.005–1.030)
pH: 6 (ref 5.0–8.0)

## 2020-01-24 LAB — CBC
HCT: 52.4 % — ABNORMAL HIGH (ref 39.0–52.0)
Hemoglobin: 16.7 g/dL (ref 13.0–17.0)
MCH: 26.1 pg (ref 26.0–34.0)
MCHC: 31.9 g/dL (ref 30.0–36.0)
MCV: 81.9 fL (ref 80.0–100.0)
Platelets: 162 10*3/uL (ref 150–400)
RBC: 6.4 MIL/uL — ABNORMAL HIGH (ref 4.22–5.81)
RDW: 14.6 % (ref 11.5–15.5)
WBC: 14.2 10*3/uL — ABNORMAL HIGH (ref 4.0–10.5)
nRBC: 0 % (ref 0.0–0.2)

## 2020-01-24 LAB — LIPASE, BLOOD: Lipase: 15 U/L (ref 11–51)

## 2020-01-24 MED ORDER — ONDANSETRON 4 MG PO TBDP
4.0000 mg | ORAL_TABLET | Freq: Once | ORAL | Status: AC | PRN
  Administered 2020-01-24: 22:00:00 4 mg via ORAL
  Filled 2020-01-24: qty 1

## 2020-01-24 MED ORDER — SODIUM CHLORIDE 0.9% FLUSH
3.0000 mL | Freq: Once | INTRAVENOUS | Status: AC
  Administered 2020-01-25: 3 mL via INTRAVENOUS

## 2020-01-24 NOTE — ED Triage Notes (Addendum)
Pt was brought into the ED by EMS, however RN did not receive a report. Pt states he was constipated however he "let loose this morning."  Pt states he has been "feeling bad since Sunday".  Say he has been "spitting up" since Sunday.   States he has not taken any medications since Sunday b/c he can't keep anything down.

## 2020-01-25 ENCOUNTER — Encounter (HOSPITAL_COMMUNITY): Payer: Self-pay | Admitting: Internal Medicine

## 2020-01-25 ENCOUNTER — Inpatient Hospital Stay (HOSPITAL_COMMUNITY): Payer: Medicare Other

## 2020-01-25 DIAGNOSIS — Z940 Kidney transplant status: Secondary | ICD-10-CM | POA: Diagnosis not present

## 2020-01-25 DIAGNOSIS — E1010 Type 1 diabetes mellitus with ketoacidosis without coma: Principal | ICD-10-CM

## 2020-01-25 DIAGNOSIS — Z91138 Patient's unintentional underdosing of medication regimen for other reason: Secondary | ICD-10-CM | POA: Diagnosis not present

## 2020-01-25 DIAGNOSIS — Z79899 Other long term (current) drug therapy: Secondary | ICD-10-CM | POA: Diagnosis not present

## 2020-01-25 DIAGNOSIS — Z794 Long term (current) use of insulin: Secondary | ICD-10-CM | POA: Diagnosis not present

## 2020-01-25 DIAGNOSIS — Z20822 Contact with and (suspected) exposure to covid-19: Secondary | ICD-10-CM | POA: Diagnosis present

## 2020-01-25 DIAGNOSIS — Z7982 Long term (current) use of aspirin: Secondary | ICD-10-CM | POA: Diagnosis not present

## 2020-01-25 DIAGNOSIS — Z87891 Personal history of nicotine dependence: Secondary | ICD-10-CM | POA: Diagnosis not present

## 2020-01-25 DIAGNOSIS — T383X6A Underdosing of insulin and oral hypoglycemic [antidiabetic] drugs, initial encounter: Secondary | ICD-10-CM | POA: Diagnosis present

## 2020-01-25 DIAGNOSIS — F1210 Cannabis abuse, uncomplicated: Secondary | ICD-10-CM | POA: Diagnosis present

## 2020-01-25 DIAGNOSIS — E1110 Type 2 diabetes mellitus with ketoacidosis without coma: Secondary | ICD-10-CM | POA: Diagnosis present

## 2020-01-25 DIAGNOSIS — R112 Nausea with vomiting, unspecified: Secondary | ICD-10-CM | POA: Diagnosis not present

## 2020-01-25 DIAGNOSIS — E1051 Type 1 diabetes mellitus with diabetic peripheral angiopathy without gangrene: Secondary | ICD-10-CM | POA: Diagnosis present

## 2020-01-25 DIAGNOSIS — H548 Legal blindness, as defined in USA: Secondary | ICD-10-CM | POA: Diagnosis present

## 2020-01-25 DIAGNOSIS — Z89512 Acquired absence of left leg below knee: Secondary | ICD-10-CM | POA: Diagnosis not present

## 2020-01-25 DIAGNOSIS — E785 Hyperlipidemia, unspecified: Secondary | ICD-10-CM | POA: Diagnosis present

## 2020-01-25 LAB — CBC
HCT: 45.5 % (ref 39.0–52.0)
Hemoglobin: 14.7 g/dL (ref 13.0–17.0)
MCH: 26.9 pg (ref 26.0–34.0)
MCHC: 32.3 g/dL (ref 30.0–36.0)
MCV: 83.3 fL (ref 80.0–100.0)
Platelets: 119 10*3/uL — ABNORMAL LOW (ref 150–400)
RBC: 5.46 MIL/uL (ref 4.22–5.81)
RDW: 14.6 % (ref 11.5–15.5)
WBC: 12 10*3/uL — ABNORMAL HIGH (ref 4.0–10.5)
nRBC: 0 % (ref 0.0–0.2)

## 2020-01-25 LAB — BASIC METABOLIC PANEL
Anion gap: 15 (ref 5–15)
Anion gap: 19 — ABNORMAL HIGH (ref 5–15)
BUN: 28 mg/dL — ABNORMAL HIGH (ref 8–23)
BUN: 24 mg/dL — ABNORMAL HIGH (ref 8–23)
CO2: 18 mmol/L — ABNORMAL LOW (ref 22–32)
CO2: 18 mmol/L — ABNORMAL LOW (ref 22–32)
Calcium: 8.4 mg/dL — ABNORMAL LOW (ref 8.9–10.3)
Calcium: 9 mg/dL (ref 8.9–10.3)
Chloride: 106 mmol/L (ref 98–111)
Chloride: 101 mmol/L (ref 98–111)
Creatinine, Ser: 1.11 mg/dL (ref 0.61–1.24)
Creatinine, Ser: 1.2 mg/dL (ref 0.61–1.24)
GFR calc Af Amer: >60 mL/min (ref >60)
GFR calc Af Amer: >60 mL/min (ref >60)
GFR calc non Af Amer: >60 mL/min (ref >60)
GFR calc non Af Amer: >60 mL/min (ref >60)
Glucose, Bld: 130 mg/dL — ABNORMAL HIGH (ref 70–99)
Glucose, Bld: 215 mg/dL — ABNORMAL HIGH (ref 70–99)
Potassium: 5.2 mmol/L — ABNORMAL HIGH (ref 3.5–5.1)
Potassium: 3.8 mmol/L (ref 3.5–5.1)
Sodium: 139 mmol/L (ref 135–145)
Sodium: 138 mmol/L (ref 135–145)

## 2020-01-25 LAB — POCT I-STAT EG7
Acid-base deficit: 5 mmol/L — ABNORMAL HIGH (ref 0.0–2.0)
Bicarbonate: 19.2 mmol/L — ABNORMAL LOW (ref 20.0–28.0)
Calcium, Ion: 1.12 mmol/L — ABNORMAL LOW (ref 1.15–1.40)
HCT: 49 % (ref 39.0–52.0)
Hemoglobin: 16.7 g/dL (ref 13.0–17.0)
O2 Saturation: 99 %
Patient temperature: 37
Potassium: 3.8 mmol/L (ref 3.5–5.1)
Sodium: 132 mmol/L — ABNORMAL LOW (ref 135–145)
TCO2: 20 mmol/L — ABNORMAL LOW (ref 22–32)
pCO2, Ven: 32 mmHg — ABNORMAL LOW (ref 44.0–60.0)
pH, Ven: 7.386 (ref 7.250–7.430)
pO2, Ven: 145 mmHg — ABNORMAL HIGH (ref 32.0–45.0)

## 2020-01-25 LAB — GLUCOSE, CAPILLARY
Glucose-Capillary: 258 mg/dL — ABNORMAL HIGH (ref 70–99)
Glucose-Capillary: 181 mg/dL — ABNORMAL HIGH (ref 70–99)

## 2020-01-25 LAB — HIV ANTIBODY (ROUTINE TESTING W REFLEX): HIV Screen 4th Generation wRfx: NONREACTIVE

## 2020-01-25 LAB — RAPID URINE DRUG SCREEN, HOSP PERFORMED
Amphetamines: NOT DETECTED
Barbiturates: NOT DETECTED
Benzodiazepines: NOT DETECTED
Cocaine: NOT DETECTED
Opiates: NOT DETECTED
Tetrahydrocannabinol: POSITIVE — AB

## 2020-01-25 LAB — CBG MONITORING, ED
Glucose-Capillary: 330 mg/dL — ABNORMAL HIGH (ref 70–99)
Glucose-Capillary: 153 mg/dL — ABNORMAL HIGH (ref 70–99)
Glucose-Capillary: 163 mg/dL — ABNORMAL HIGH (ref 70–99)
Glucose-Capillary: 232 mg/dL — ABNORMAL HIGH (ref 70–99)

## 2020-01-25 LAB — HEMOGLOBIN A1C
Hgb A1c MFr Bld: 10 % — ABNORMAL HIGH (ref 4.8–5.6)
Mean Plasma Glucose: 240.3 mg/dL

## 2020-01-25 LAB — LACTIC ACID, PLASMA
Lactic Acid, Venous: 2.1 mmol/L (ref 0.5–1.9)
Lactic Acid, Venous: 1.9 mmol/L (ref 0.5–1.9)

## 2020-01-25 LAB — RESPIRATORY PANEL BY RT PCR (FLU A&B, COVID)
Influenza A by PCR: NEGATIVE
Influenza B by PCR: NEGATIVE
SARS Coronavirus 2 by RT PCR: NEGATIVE

## 2020-01-25 LAB — BETA-HYDROXYBUTYRIC ACID
Beta-Hydroxybutyric Acid: 4.5 mmol/L — ABNORMAL HIGH (ref 0.05–0.27)
Beta-Hydroxybutyric Acid: 3.09 mmol/L — ABNORMAL HIGH (ref 0.05–0.27)
Beta-Hydroxybutyric Acid: 1.19 mmol/L — ABNORMAL HIGH (ref 0.05–0.27)

## 2020-01-25 MED ORDER — INSULIN GLARGINE 100 UNIT/ML ~~LOC~~ SOLN
20.0000 [IU] | Freq: Every day | SUBCUTANEOUS | Status: DC
  Filled 2020-01-25 (×2): qty 0.2

## 2020-01-25 MED ORDER — TACROLIMUS 1 MG PO CAPS
5.0000 mg | ORAL_CAPSULE | Freq: Two times a day (BID) | ORAL | Status: DC
  Administered 2020-01-26 – 2020-01-27 (×3): 5 mg via ORAL
  Filled 2020-01-25 (×5): qty 5

## 2020-01-25 MED ORDER — METOCLOPRAMIDE HCL 5 MG/ML IJ SOLN
10.0000 mg | Freq: Three times a day (TID) | INTRAMUSCULAR | Status: DC
  Administered 2020-01-25 – 2020-01-26 (×4): 10 mg via INTRAVENOUS
  Filled 2020-01-25 (×4): qty 2

## 2020-01-25 MED ORDER — INSULIN ASPART 100 UNIT/ML ~~LOC~~ SOLN
0.0000 [IU] | Freq: Three times a day (TID) | SUBCUTANEOUS | Status: DC
  Administered 2020-01-25: 17:00:00 5 [IU] via SUBCUTANEOUS
  Administered 2020-01-25: 3 [IU] via SUBCUTANEOUS
  Administered 2020-01-26: 5 [IU] via SUBCUTANEOUS
  Administered 2020-01-26 – 2020-01-27 (×2): 2 [IU] via SUBCUTANEOUS

## 2020-01-25 MED ORDER — ENOXAPARIN SODIUM 40 MG/0.4ML ~~LOC~~ SOLN
40.0000 mg | Freq: Every day | SUBCUTANEOUS | Status: DC
  Administered 2020-01-25 – 2020-01-27 (×3): 40 mg via SUBCUTANEOUS
  Filled 2020-01-25 (×3): qty 0.4

## 2020-01-25 MED ORDER — POTASSIUM CHLORIDE 10 MEQ/100ML IV SOLN
10.0000 meq | INTRAVENOUS | Status: AC
  Filled 2020-01-25: qty 100

## 2020-01-25 MED ORDER — DEXTROSE-NACL 5-0.45 % IV SOLN: INTRAVENOUS | Status: DC

## 2020-01-25 MED ORDER — ATORVASTATIN CALCIUM 40 MG PO TABS
40.0000 mg | ORAL_TABLET | Freq: Every day | ORAL | Status: DC
  Administered 2020-01-26: 40 mg via ORAL
  Filled 2020-01-25 (×2): qty 1

## 2020-01-25 MED ORDER — INSULIN REGULAR(HUMAN) IN NACL 100-0.9 UT/100ML-% IV SOLN: INTRAVENOUS | Status: DC

## 2020-01-25 MED ORDER — ASPIRIN EC 81 MG PO TBEC
81.0000 mg | DELAYED_RELEASE_TABLET | Freq: Every day | ORAL | Status: DC
  Administered 2020-01-25 – 2020-01-27 (×3): 81 mg via ORAL
  Filled 2020-01-25 (×3): qty 1

## 2020-01-25 MED ORDER — AMLODIPINE BESYLATE 10 MG PO TABS
10.0000 mg | ORAL_TABLET | Freq: Every day | ORAL | Status: DC
  Administered 2020-01-25 – 2020-01-27 (×3): 10 mg via ORAL
  Filled 2020-01-25: qty 1
  Filled 2020-01-25: qty 2
  Filled 2020-01-25: qty 1

## 2020-01-25 MED ORDER — INSULIN REGULAR(HUMAN) IN NACL 100-0.9 UT/100ML-% IV SOLN
INTRAVENOUS | Status: DC
  Administered 2020-01-25: 13 [IU]/h via INTRAVENOUS
  Filled 2020-01-25: qty 100

## 2020-01-25 MED ORDER — SODIUM CHLORIDE 0.9 % IV BOLUS
1000.0000 mL | Freq: Once | INTRAVENOUS | Status: AC
  Administered 2020-01-25: 1000 mL via INTRAVENOUS

## 2020-01-25 MED ORDER — MYCOPHENOLATE MOFETIL 250 MG PO CAPS
1000.0000 mg | ORAL_CAPSULE | Freq: Two times a day (BID) | ORAL | Status: DC
  Administered 2020-01-26 – 2020-01-27 (×3): 1000 mg via ORAL
  Filled 2020-01-25 (×6): qty 4

## 2020-01-25 MED ORDER — INSULIN ASPART 100 UNIT/ML ~~LOC~~ SOLN: 0.0000 [IU] | Freq: Every day | SUBCUTANEOUS | Status: DC

## 2020-01-25 MED ORDER — SODIUM CHLORIDE 0.9 % IV SOLN: INTRAVENOUS | Status: DC

## 2020-01-25 MED ORDER — DEXTROSE 50 % IV SOLN: 0.0000 mL | INTRAVENOUS | Status: DC | PRN

## 2020-01-25 MED ORDER — METOCLOPRAMIDE HCL 5 MG/ML IJ SOLN
10.0000 mg | Freq: Once | INTRAMUSCULAR | Status: AC
  Administered 2020-01-25: 10 mg via INTRAVENOUS
  Filled 2020-01-25: qty 2

## 2020-01-25 MED ORDER — TRAZODONE HCL 50 MG PO TABS: 50.0000 mg | ORAL_TABLET | Freq: Every evening | ORAL | Status: DC | PRN

## 2020-01-25 MED ORDER — LISINOPRIL 10 MG PO TABS
10.0000 mg | ORAL_TABLET | Freq: Every day | ORAL | Status: DC
  Administered 2020-01-25 – 2020-01-27 (×3): 10 mg via ORAL
  Filled 2020-01-25 (×3): qty 1

## 2020-01-25 MED ORDER — LABETALOL HCL 200 MG PO TABS: 200.0000 mg | ORAL_TABLET | Freq: Two times a day (BID) | ORAL | Status: DC

## 2020-01-25 MED ORDER — HYDRALAZINE HCL 20 MG/ML IJ SOLN
5.0000 mg | INTRAMUSCULAR | Status: DC | PRN
  Administered 2020-01-25: 16:00:00 5 mg via INTRAVENOUS
  Filled 2020-01-25 (×2): qty 1

## 2020-01-25 MED ORDER — CAPSAICIN 0.025 % EX CREA
TOPICAL_CREAM | Freq: Two times a day (BID) | CUTANEOUS | Status: DC
  Filled 2020-01-25 (×2): qty 60

## 2020-01-25 NOTE — ED Notes (Signed)
This RN attempted IV placement 4x, unsuccessful.

## 2020-01-25 NOTE — Progress Notes (Signed)
Reviewed PIV access needs. Pt currently has PIV access. Consult cleared.

## 2020-01-25 NOTE — H&P (Signed)
History and Physical    [name] GGY:[id number] DOB: 1951/03/04 DOA: 01/24/2020  PCP: Patient, No Pcp Per  Patient coming from: Home.  Chief Complaint: Nausea vomiting.  HPI: [name] is a 69 y.o. male with history of diabetes mellitus type 1, renal transplant, left BKA, hypertension hyperlipidemia marijuana abuse presents to the ER with complaint of intractable nausea vomiting over the last 3 days. Patient stated symptoms started after he took some marijuana and also ate some broccoli and chicken casserole at his friend's house. No one else is sick at home. Denies fever chills. Had a normal bowel movement yesterday. Due to persistent vomiting patient presents to the ER. No chest pain or shortness of breath.  ED Course: In the ER patient remained persistently nauseous and vomiting. Abdomen appears benign. Labs show normal LFTs and lipase. Blood glucose 340 with anion gap of 18 and beta hydroxybutyric acid level of 4.5. UA shows ketones and CBC showed WBC of 14.2. Patient was started on IV fluids and DKA protocol. Admitted for further management of intractable nausea vomiting with DKA.  Review of Systems: As per HPI, rest all negative.   Past Medical History:  Diagnosis Date  . Anxiety   . CPAP (continuous positive airway pressure) dependence   . Diabetes mellitus without complication ([place])   . GERD (gastroesophageal reflux disease)   . Hypertension   . Legally blind in left eye, as defined in [place]   . PTSD (post-traumatic stress disorder)   . PVD (peripheral vascular disease) ([place]) 01/17/2015  . Renal disorder   . Sleep apnea   . Thyroid disease     Past Surgical History:  Procedure Laterality Date  . ABDOMINAL AORTAGRAM N/A 01/21/2015   Procedure: ABDOMINAL Maxcine Ham;  Surgeon: Angelia Mould, MD;  Location: [facility] CATH LAB;  Service: Cardiovascular;  Laterality: N/A;  . AMPUTATION Left 01/23/2015   Procedure: FIRST RAY AMPUTATION/LEFT FOOT;  Surgeon: Newt Minion,  MD;  Location: [place];  Service: Orthopedics;  Laterality: Left;  . AMPUTATION Left 02/20/2015   Procedure: AMPUTATION BELOW KNEE;  Surgeon: Newt Minion, MD;  Location: [place];  Service: Orthopedics;  Laterality: Left;  . AV FISTULA PLACEMENT    . COLONOSCOPY    . COMBINED KIDNEY-PANCREAS TRANSPLANT Left 2009  . INCISION AND DRAINAGE ABSCESS Left 01/13/2015   Procedure: INCISION AND DRAINAGE OF LEFT FOOT FIRST RAY ABCESS;  Surgeon: Mcarthur Rossetti, MD;  Location: [place];  Service: Orthopedics;  Laterality: Left;  . LOWER EXTREMITY ANGIOGRAM Left 01/21/2015   Procedure: LOWER EXTREMITY ANGIOGRAM;  Surgeon: Angelia Mould, MD;  Location: [facility] CATH LAB;  Service: Cardiovascular;  Laterality: Left;     reports that he has quit smoking. He has never used smokeless tobacco. He reports current alcohol use. He reports that he does not use drugs.  No Known Allergies  Family History  Problem Relation Age of Onset  . Hypertension Other     Prior to Admission medications   Medication Sig Start Date End Date Taking? Authorizing Provider  acetaminophen (TYLENOL) 325 MG tablet Take 970 mg by mouth 3 (three) times daily.   Yes [provider]  amLODipine (NORVASC) 10 MG tablet Take 10 mg by mouth daily.   Yes [provider]  aspirin EC 81 MG tablet Take 81 mg by mouth daily.   Yes [provider]  atorvastatin (LIPITOR) 80 MG tablet Take 40 mg by mouth at bedtime.    Yes [provider]  insulin  glargine (LANTUS) 100 UNIT/ML injection Inject 20 Units into the skin daily.   Yes [provider]  mycophenolate (CELLCEPT) 250 MG capsule Take 1,000 mg by mouth 2 (two) times daily.   Yes [provider]  pantoprazole (PROTONIX) 40 MG tablet Take 1 tablet (40 mg total) by mouth daily. 07/28/17  Yes Osvaldo Shipper, MD  senna (SENOKOT) 8.6 MG TABS tablet Take 1 tablet by mouth 2 (two) times daily.   Yes [provider]  tacrolimus  (PROGRAF) 5 MG capsule Take 5 mg by mouth 2 (two) times daily.   Yes [provider]  traZODone (DESYREL) 50 MG tablet Take 50 mg by mouth at bedtime as needed for sleep.   Yes [provider]  furosemide (LASIX) 20 MG tablet Take 1 tablet (20 mg total) by mouth daily. Patient not taking: Reported on 10/30/2019 07/28/17   Osvaldo Shipper, MD  insulin detemir (LEVEMIR) 100 UNIT/ML injection Inject 0.15 mLs (15 Units total) into the skin 2 (two) times daily. Patient not taking: Reported on 01/25/2020 07/28/17   Osvaldo Shipper, MD  labetalol (NORMODYNE) 100 MG tablet Take 2 tablets (200 mg total) by mouth 2 (two) times daily. Patient not taking: Reported on 10/30/2019 07/28/17   Osvaldo Shipper, MD  lisinopril (PRINIVIL,ZESTRIL) 10 MG tablet Take 1 tablet (10 mg total) by mouth daily. Patient not taking: Reported on 10/30/2019 01/07/18   Burnadette Pop, MD  metoCLOPramide (REGLAN) 5 MG tablet Take 1 tablet (5 mg total) by mouth 3 (three) times daily for 3 days. Patient not taking: Reported on 04/24/2019 10/20/17 01/05/26  Leatha Gilding, MD  ondansetron (ZOFRAN) 4 MG tablet Take 1 tablet (4 mg total) by mouth every 8 (eight) hours as needed for nausea or vomiting. Patient not taking: Reported on 10/30/2019 04/24/19   Devoria Albe, MD  potassium chloride 20 MEQ TBCR Take 20 mEq by mouth daily. Patient not taking: Reported on 10/30/2019 01/07/18   Burnadette Pop, MD  silver sulfADIAZINE (SILVADENE) 1 % cream Apply 1 application topically daily. Patient not taking: Reported on 10/30/2019 06/12/19   Eustace Moore, MD    Physical Exam: Constitutional: Moderately built and nourished. Vitals:   01/24/20 2131 01/25/20 0017 01/25/20 0230 01/25/20 0400  BP:  (!) 172/106 (!) 195/79 (!) 151/72  Pulse:  (!) 122 (!) 116 (!) 114  Resp:  (!) 22 18 18   Temp:  97.7 F (36.5 C)    TempSrc:  Oral    SpO2:  100% 100% 97%  Weight: 77.1 kg     Height: 5\' 10"  (1.778 m)      Eyes: Anicteric no  pallor. ENMT: No discharge from the ears eyes nose or mouth. Neck: No mass felt. No neck rigidity. Respiratory: No rhonchi or crepitations. Cardiovascular: S1-S2 heard. Abdomen: Soft nontender bowel sounds present. Musculoskeletal: No edema. Skin: No rash. Neurologic: Alert awake oriented to time place and person. Moves all extremities. Psychiatric: Appears normal per normal affect.   Labs on Admission: I have personally reviewed following labs and imaging studies  CBC: Recent Labs  Lab 01/24/20 2144 01/25/20 0323  WBC 14.2*  --   HGB 16.7 17.7*  HCT 52.4* 52.0  MCV 81.9  --   PLT 162  --    Basic Metabolic Panel: Recent Labs  Lab 01/24/20 2144 01/25/20 0323  NA 134* 126*  K 4.0 >8.5*  CL 94*  --   CO2 22  --   GLUCOSE 340*  --   BUN 28*  --  CREATININE 1.24  --   CALCIUM 9.7  --    GFR: Estimated Creatinine Clearance: 59.7 mL/min (by C-G formula based on SCr of 1.24 mg/dL). Liver Function Tests: Recent Labs  Lab 01/24/20 2144  AST 12*  ALT 15  ALKPHOS 117  BILITOT 1.4*  PROT 7.5  ALBUMIN 4.7   Recent Labs  Lab 01/24/20 2144  LIPASE 15   No results for input(s): AMMONIA in the last 168 hours. Coagulation Profile: No results for input(s): INR, PROTIME in the last 168 hours. Cardiac Enzymes: No results for input(s): CKTOTAL, CKMB, CKMBINDEX, TROPONINI in the last 168 hours. BNP (last 3 results) No results for input(s): PROBNP in the last 8760 hours. HbA1C: No results for input(s): HGBA1C in the last 72 hours. CBG: Recent Labs  Lab 01/25/20 0416  GLUCAP 330*   Lipid Profile: No results for input(s): CHOL, HDL, LDLCALC, TRIG, CHOLHDL, LDLDIRECT in the last 72 hours. Thyroid Function Tests: No results for input(s): TSH, T4TOTAL, FREET4, T3FREE, THYROIDAB in the last 72 hours. Anemia Panel: No results for input(s): VITAMINB12, FOLATE, FERRITIN, TIBC, IRON, RETICCTPCT in the last 72 hours. Urine analysis:    Component Value Date/Time    COLORURINE YELLOW 01/24/2020 2141   APPEARANCEUR CLEAR 01/24/2020 2141   LABSPEC 1.027 01/24/2020 2141   PHURINE 6.0 01/24/2020 2141   GLUCOSEU >=500 (A) 01/24/2020 2141   HGBUR SMALL (A) 01/24/2020 2141   BILIRUBINUR NEGATIVE 01/24/2020 2141   KETONESUR 80 (A) 01/24/2020 2141   PROTEINUR >=300 (A) 01/24/2020 2141   UROBILINOGEN 1.0 09/29/2013 2339   NITRITE NEGATIVE 01/24/2020 2141   LEUKOCYTESUR NEGATIVE 01/24/2020 2141   Sepsis Labs: @LABRCNTIP (procalcitonin:4,lacticidven:4) )No results found for this or any previous visit (from the past 240 hour(s)).   Radiological Exams on Admission: No results found.  EKG: Independently reviewed. Sinus tachycardia.  Assessment/Plan Principal Problem:   DKA (diabetic ketoacidoses) (HCC) Active Problems:   Nausea and vomiting   Kidney transplant recipient    1. Diabetic ketoacidosis -could be precipitated by dehydration and vomiting. Check hemoglobin A1c. Patient presently on IV fluids and IV insulin infusion until anion gap gets corrected. 2. Intractable nausea vomiting could be from gastroparesis marijuana abuse and also could be possibly food poisoning. Acute abdominal series is pending. Continue to treat symptomatically. May need Reglan if x-rays do not show any obstruction. 3. Hypertension on amlodipine. 4. Hyperlipidemia on statins. 5. Marijuana abuse advised about quitting. 6. Renal transplant on tacrolimus and mycophenolate which patient did miss couple of doses due to vomiting. Closely monitor metabolic panel.  Given the patient has persistent vomiting with DKA will need close monitoring for any further deterioration in inpatient status.  Covid test is pending.   DVT prophylaxis: Lovenox. Code Status: Full code. Family Communication: Discussed with patient. Disposition Plan: Home. Consults called: None. Admission status: Inpatient.   MD [facility] Pager [phone].  If 7PM-7AM, please  contact night-coverage [email or web address] Password [facility]  01/25/2020, 5:04 AM

## 2020-01-25 NOTE — ED Notes (Signed)
Lunch Tray Ordered @ 1119. 

## 2020-01-25 NOTE — ED Provider Notes (Signed)
[facility] EMERGENCY DEPARTMENT Provider Note   CSN: [id number] Arrival date & time: 01/24/20  2007     History Chief Complaint  Patient presents with  . Constipation  . Emesis    [name] is a 69 y.o. male with pertinent past medical history of diabetes type 1 (on Levemir), hypertension, CKD (kidney transplant recipient), that presents to the emergency department for 3 days of nausea and vomiting.  He states that he smoked marijuana and then started having nausea and vomiting.  He states that he has not been able to keep anything down.  He denies any bloody or bilious vomiting, he states that he has just been spitting up saliva.  Patient states that he has not taken any of his medications for three days  including his insulin.  He denies any alcohol use. He states that he has had two normal bowel movements without hematochezia in the last day, he states that he has been passing gas. He states that he feels weak, he denies any chest pain, abdominal pain, shortness of breath, dizziness, headache, swelling, paresthesias, fevers, chills, dysuira, hematruia.   . Per chart review he has been seen here for the same and has been admitted due to his immunosuppression and dehydration. HPI     Past Medical History:  Diagnosis Date  . Anxiety   . CPAP (continuous positive airway pressure) dependence   . Diabetes mellitus without complication (HCC)   . GERD (gastroesophageal reflux disease)   . Hypertension   . Legally blind in left eye, as defined in [place]   . PTSD (post-traumatic stress disorder)   . PVD (peripheral vascular disease) (HCC) 01/17/2015  . Renal disorder   . Sleep apnea   . Thyroid disease     Patient Active Problem List   Diagnosis Date Noted  . OSA on CPAP 09/13/2018  . Intractable nausea and vomiting 09/12/2018  . Kidney transplant recipient 09/12/2018  . DKA, type 1 (HCC) 01/05/2018  . Nausea and vomiting 10/19/2017  . Nausea & vomiting  10/18/2017  . Hematemesis 07/24/2017  . Hypertensive urgency 07/24/2017  . Acute GI bleeding 07/24/2017  . 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 S/P BKA (below knee amputation) unilateral (HCC) 02/20/2015  . Ischemic ulcer of toe (HCC)   . Peripheral vascular disease (HCC)   . PVD (peripheral vascular disease) (HCC) 01/17/2015  . Cellulitis and abscess of foot 01/13/2015  . Cellulitis and abscess of foot excluding toe 01/13/2015  . Diabetes mellitus without complication (HCC) 01/13/2015  . Leukocytosis 01/13/2015  . Diabetes mellitus with peripheral vascular disease (HCC) 12/19/2007  . DYSLIPIDEMIA 12/19/2007  . ANEMIA 12/19/2007  . ANXIETY 12/19/2007  . Essential hypertension 12/19/2007  . TRANSIENT ISCHEMIC ATTACK 12/19/2007  . PERIPHERAL VASCULAR DISEASE 12/19/2007  . RENAL FAILURE 12/19/2007    Past Surgical History:  Procedure Laterality Date  . ABDOMINAL AORTAGRAM N/A 01/21/2015   Procedure: ABDOMINAL Ronny Flurry;  Surgeon: Chuck Hint, MD;  Location: [facility] CATH LAB;  Service: Cardiovascular;  Laterality: N/A;  . AMPUTATION Left 01/23/2015   Procedure: FIRST RAY AMPUTATION/LEFT FOOT;  Surgeon: Nadara Mustard, MD;  Location: [facility] OR;  Service: Orthopedics;  Laterality: Left;  . AMPUTATION Left 02/20/2015   Procedure: AMPUTATION BELOW KNEE;  Surgeon: Nadara Mustard, MD;  Location: [facility] OR;  Service: Orthopedics;  Laterality: Left;  . AV FISTULA PLACEMENT    . COLONOSCOPY    . COMBINED KIDNEY-PANCREAS TRANSPLANT Left 2009  .  INCISION AND DRAINAGE ABSCESS Left 01/13/2015   Procedure: INCISION AND DRAINAGE OF LEFT FOOT FIRST RAY ABCESS;  Surgeon: Kathryne Hitch, MD;  Location: [facility] OR;  Service: Orthopedics;  Laterality: Left;  . LOWER EXTREMITY ANGIOGRAM Left 01/21/2015   Procedure: LOWER EXTREMITY ANGIOGRAM;  Surgeon: Chuck Hint, MD;  Location: [facility] CATH LAB;  Service: Cardiovascular;  Laterality: Left;       Family History   Problem Relation Age of Onset  . Hypertension Other     Social History   Tobacco Use  . Smoking status: Former Games developer  . Smokeless tobacco: Never Used  . Tobacco comment: quit i [id number]  Substance Use Topics  . Alcohol use: Yes    Comment: 1 beer rarely   . Drug use: No    Home Medications Prior to Admission medications   Medication Sig Start Date End Date Taking? Authorizing Provider  acetaminophen (TYLENOL) 325 MG tablet Take 970 mg by mouth 3 (three) times daily.    [provider]  amLODipine (NORVASC) 10 MG tablet Take 10 mg by mouth daily.    [provider]  aspirin EC 81 MG tablet Take 81 mg by mouth daily.    [provider]  atorvastatin (LIPITOR) 80 MG tablet Take 40 mg by mouth at bedtime.     [provider]  furosemide (LASIX) 20 MG tablet Take 1 tablet (20 mg total) by mouth daily. Patient not taking: Reported on 10/30/2019 07/28/17   Osvaldo Shipper, MD  insulin detemir (LEVEMIR) 100 UNIT/ML injection Inject 0.15 mLs (15 Units total) into the skin 2 (two) times daily. Patient taking differently: Inject 20 Units into the skin daily.  07/28/17   Osvaldo Shipper, MD  labetalol (NORMODYNE) 100 MG tablet Take 2 tablets (200 mg total) by mouth 2 (two) times daily. Patient not taking: Reported on 10/30/2019 07/28/17   Osvaldo Shipper, MD  lisinopril (PRINIVIL,ZESTRIL) 10 MG tablet Take 1 tablet (10 mg total) by mouth daily. Patient not taking: Reported on 10/30/2019 01/07/18   Burnadette Pop, MD  metoCLOPramide (REGLAN) 5 MG tablet Take 1 tablet (5 mg total) by mouth 3 (three) times daily for 3 days. Patient not taking: Reported on 04/24/2019 10/20/17 01/05/26  Leatha Gilding, MD  mycophenolate (CELLCEPT) 250 MG capsule Take 1,000 mg by mouth 2 (two) times daily.    [provider]  ondansetron (ZOFRAN) 4 MG tablet Take 1 tablet (4 mg total) by mouth every 8 (eight) hours as needed for nausea or vomiting. Patient not taking:  Reported on 10/30/2019 04/24/19   Devoria Albe, MD  pantoprazole (PROTONIX) 40 MG tablet Take 1 tablet (40 mg total) by mouth daily. 07/28/17   Osvaldo Shipper, MD  potassium chloride 20 MEQ TBCR Take 20 mEq by mouth daily. Patient not taking: Reported on 10/30/2019 01/07/18   Burnadette Pop, MD  senna (SENOKOT) 8.6 MG TABS tablet Take 1 tablet by mouth 2 (two) times daily.    [provider]  silver sulfADIAZINE (SILVADENE) 1 % cream Apply 1 application topically daily. Patient not taking: Reported on 10/30/2019 06/12/19   Eustace Moore, MD  tacrolimus (PROGRAF) 5 MG capsule Take 5 mg by mouth 2 (two) times daily.    [provider]  traZODone (DESYREL) 50 MG tablet Take 50 mg by mouth at bedtime as needed for sleep.    [provider]    Allergies    Patient has no known allergies.  Review of Systems   Review of  Systems  Constitutional: Negative for chills, diaphoresis, fatigue and fever.  HENT: Negative for congestion, sore throat and trouble swallowing.   Eyes: Negative for pain and visual disturbance.  Respiratory: Negative for cough, shortness of breath and wheezing.   Cardiovascular: Negative for chest pain, palpitations and leg swelling.  Gastrointestinal: Positive for nausea and vomiting. Negative for abdominal distention, abdominal pain and diarrhea.  Genitourinary: Negative for difficulty urinating.  Musculoskeletal: Negative for back pain, neck pain and neck stiffness.  Skin: Negative for pallor.  Neurological: Negative for dizziness, speech difficulty, weakness and headaches.  Psychiatric/Behavioral: Negative for confusion.    Physical Exam Updated Vital Signs BP (!) 172/106 (BP Location: Left Arm)   Pulse (!) 122   Temp 97.7 F (36.5 C) (Oral)   Resp (!) 22   Ht 5\' 10"  (1.778 m)   Wt 77.1 kg   SpO2 100%   BMI 24.39 kg/m   Physical Exam Constitutional:      General: He is not in acute distress.    Appearance: Normal appearance. He is  ill-appearing (currently spitting up saliva ). He is not toxic-appearing or diaphoretic.  HENT:     Mouth/Throat:     Mouth: Mucous membranes are dry.     Pharynx: Oropharynx is clear.  Eyes:     General: No scleral icterus.    Extraocular Movements: Extraocular movements intact.     Pupils: Pupils are equal, round, and reactive to light.  Cardiovascular:     Rate and Rhythm: Normal rate and regular rhythm.     Pulses: Normal pulses.     Heart sounds: Normal heart sounds.  Pulmonary:     Effort: Pulmonary effort is normal. No respiratory distress.     Breath sounds: Normal breath sounds. No stridor. No wheezing, rhonchi or rales.  Chest:     Chest wall: No tenderness.  Abdominal:     General: Abdomen is flat. There is no distension.     Palpations: Abdomen is soft.     Tenderness: There is no abdominal tenderness. There is no guarding or rebound.  Musculoskeletal:        General: No swelling or tenderness. Normal range of motion.     Cervical back: Normal range of motion and neck supple. No rigidity.     Right lower leg: No edema.     Left lower leg: No edema.  Skin:    General: Skin is warm and dry.     Capillary Refill: Capillary refill takes less than 2 seconds.     Coloration: Skin is not jaundiced or pale.  Neurological:     General: No focal deficit present.     Mental Status: He is alert and oriented to person, place, and time.     Sensory: No sensory deficit.  Psychiatric:        Mood and Affect: Mood normal.        Behavior: Behavior normal.     ED Results / Procedures / Treatments   Labs (all labs ordered are listed, but only abnormal results are displayed) Labs Reviewed  COMPREHENSIVE METABOLIC PANEL - Abnormal; Notable for the following components:      Result Value   Sodium 134 (*)    Chloride 94 (*)    Glucose, Bld 340 (*)    BUN 28 (*)    AST 12 (*)    Total Bilirubin 1.4 (*)    GFR calc non Af Amer 60 (*)    Anion gap 18 (*)  All other  components within normal limits  CBC - Abnormal; Notable for the following components:   WBC 14.2 (*)    RBC 6.40 (*)    HCT 52.4 (*)    All other components within normal limits  URINALYSIS, ROUTINE W REFLEX MICROSCOPIC - Abnormal; Notable for the following components:   Glucose, UA >=500 (*)    Hgb urine dipstick SMALL (*)    Ketones, ur 80 (*)    Protein, ur >=300 (*)    All other components within normal limits  LIPASE, BLOOD    EKG None  Radiology No results found.  Procedures .Critical Care Performed by: Alfredia Client, PA-C Authorized by: Alfredia Client, PA-C   Critical care provider statement:    Critical care time (minutes):  45   Critical care was necessary to treat or prevent imminent or life-threatening deterioration of the following conditions: DKA, insulin infusion.   Critical care was time spent personally by me on the following activities:  Discussions with consultants, evaluation of patient's response to treatment, examination of patient, ordering and performing treatments and interventions, ordering and review of laboratory studies, ordering and review of radiographic studies, pulse oximetry, re-evaluation of patient's condition, obtaining history from patient or surrogate and review of old charts   (including critical care time)  Medications Ordered in ED Medications  sodium chloride flush (NS) 0.9 % injection 3 mL (has no administration in time range)  ondansetron (ZOFRAN-ODT) disintegrating tablet 4 mg (4 mg Oral Given 01/24/20 2136)    ED Course  I have reviewed the triage vital signs and the nursing notes.  Pertinent labs & imaging results that were available during my care of the patient were reviewed by me and considered in my medical decision making (see chart for details).  Clinical Course as of Jan 25 335  Thu Jan 25, 2020  0056 Glucose(!): 340 [SP]  0329 pH, Ven(!): 7.484 [SP]    Clinical Course User Index [SP] Alfredia Client, PA-C   MDM  Rules/Calculators/A&P                     Theresa Dohrman is a 69 y.o. male with pertinent past medical history of diabetes type 1 (on Levemir), hypertension, CKD (kidney transplant recipient), that presents to the emergency department for 3 days of nausea and vomiting.  Patient is tachycardic and dry on exam.  He is afebrile and hypertensive.  He denies any abdominal pain or pain elsewhere.  Benign exam, patient is actively spitting up saliva.  glucose is currently 340, anion gap of 18.  Suspect DKA at this time.  Lactic acid ordered.  Patient still feel nauseous after Zofran, will order Reglan to control nausea.  First VBG unreliable due to hemolyzed patient.  Will order another one. 2L of fluid, insulin/D50, potassium ordered.   Patient to be admitted due to anion gap and presumed DKA.  Discussed case with my attending physician who is agreeable to plan to consult for admission. 3:38 AM discussed case with hospitalist, Dr. Hal Hope, who agrees to accept care of patient. Labs pending.   The patient appears reasonably stabilized for admission considering the current resources, flow, and capabilities available in the ED at this time, and I doubt any other [facility] requiring further screening and/or treatment in the ED prior to admission.   .I discussed this case with my attending physician, Dr. Dayna Barker, who cosigned this note including patient's presenting symptoms, physical exam, and planned diagnostics and interventions. Attending physician stated  agreement with plan or made changes to plan which were implemented.   Attending physician assessed patient at bedside.  Final Clinical Impression(s) / ED Diagnoses Final diagnoses:  None    Rx / DC Orders ED Discharge Orders    None       Farrel Gordonatel, Vivianna Piccini, PA-C 01/25/20 0600    Mesner, [name] CowerJason, MD 01/26/20 [phone]

## 2020-01-25 NOTE — Progress Notes (Signed)
Progress Note    [name]  STM:[id number] DOB: 1951-06-15  DOA: 01/24/2020 PCP: Patient, No Pcp Per    Brief Narrative:     Medical records reviewed and are as summarized below:  [name] is an 69 y.o. male with a past medical history that includes diabetes type 1, renal transplant, peripheral vascular disease status post left BKA, hypertension, hyperlipidemia, marijuana abuse, admitted April 29 with intractable nausea and vomiting and DKA with an anion gap of 18. He was started on an insulin drip and provided with vigorous IV fluids.  Assessment/Plan:   Principal Problem:   DKA (diabetic ketoacidoses) (HCC) Active Problems:   Essential hypertension   Nausea and vomiting   Kidney transplant recipient  #1. DKA. Likely related to intractable nausea vomiting and some noncompliance. This morning his gap is closed. Endo tool discontinued. Home medications include Lantus. -Continue Lantus at home dose -Sliding scale insulin for optimal control -Obtain a hemoglobin A1c -Continue clear liquids -Monitor  #2. Nausea and vomiting. Patient has a history of intractable nausea and vomiting. Suspect related to cannabinoid hyperemesis syndrome and/or gastroparesis. He also has mild leukocytosis so gastroenteritis possibility.  Abdominal x-ray negative. He was provided with Reglan and Zofran. -Continue scheduled Reglan -As needed Zofran -IV fluids -If no improvement make n.p.o.  #3. Hypertension. Likely related to not having taken his meds in the setting of #1 and #2. Home medications include amlodipine, lisinopril, labetalol. -Continue amlodipine -Continue lisinopril -Holding beta-blocker for now until urine drug screen is back -Monitor closely -As needed hydralazine  #4. Status post kidney transplant. Home medications include CellCept and Prograf. Creatinine stable -Continue home meds  #5. Diabetes. See #1  Family Communication/Anticipated D/C date and plan/Code  Status   DVT prophylaxis: Lovenox ordered. Code Status: Full Code.  Family Communication: patient all questions answered Disposition Plan: Status is: Inpatient  Remains inpatient appropriate because:Inpatient level of care appropriate due to severity of illness   Dispo: The patient is from: Home              Anticipated d/c is to: Home              Anticipated d/c date is: 2 days              Patient currently is not medically stable to d/c.   Medical Consultants:    None.   Anti-Infectives:    None  Subjective:   Eyes closed arouses to verbal stimuli. Complains nausea. Denies pain  Objective:    Vitals:   01/25/20 0630 01/25/20 0816 01/25/20 0830 01/25/20 0915  BP: 140/68 (!) 180/73 (!) 185/81 (!) 189/92  Pulse: (!) 103 (!) 108 (!) 113 (!) 119  Resp: 18 19 18    Temp:      TempSrc:      SpO2: 97% 99% 99% 97%  Weight:      Height:       No intake or output data in the 24 hours ending 01/25/20 1003 Filed Weights   01/24/20 2104 01/24/20 2131  Weight: 79.4 kg 77.1 kg    Exam: General: Awake no acute distress CV: Tachycardic but regular no murmur gallop or rub left below the knee amputation no edema on right Respiratory: No increased work of breathing breath sounds are clear bilaterally I hear no rhonchi no wheezes Abdomen: Soft nondistended nontender to palpation positive bowel sounds throughout no guarding or rebounding Musculoskeletal: Left below the knee amputation otherwise joints without swelling/erythema Neuro: Awake alert oriented  x3 speech clear facial symmetry  Data Reviewed:   I have personally reviewed following labs and imaging studies:  Labs: Labs show the following:   Basic Metabolic Panel: Recent Labs  Lab 01/24/20 2144 01/24/20 2144 01/25/20 0323 01/25/20 0323 01/25/20 0503 01/25/20 0543  NA 134*  --  126*  --  139 132*  K 4.0   < > >8.5*   < > 5.2* 3.8  CL 94*  --   --   --  106  --   CO2 22  --   --   --  18*  --   GLUCOSE  340*  --   --   --  130*  --   BUN 28*  --   --   --  28*  --   CREATININE 1.24  --   --   --  1.11  --   CALCIUM 9.7  --   --   --  8.4*  --    < > = values in this interval not displayed.   GFR Estimated Creatinine Clearance: 66.7 mL/min (by C-G formula based on SCr of 1.11 mg/dL). Liver Function Tests: Recent Labs  Lab 01/24/20 2144  AST 12*  ALT 15  ALKPHOS 117  BILITOT 1.4*  PROT 7.5  ALBUMIN 4.7   Recent Labs  Lab 01/24/20 2144  LIPASE 15   No results for input(s): AMMONIA in the last 168 hours. Coagulation profile No results for input(s): INR, PROTIME in the last 168 hours.  CBC: Recent Labs  Lab 01/24/20 2144 01/25/20 0323 01/25/20 0503 01/25/20 0543  WBC 14.2*  --  12.0*  --   HGB 16.7 17.7* 14.7 16.7  HCT 52.4* 52.0 45.5 49.0  MCV 81.9  --  83.3  --   PLT 162  --  119*  --    Cardiac Enzymes: No results for input(s): CKTOTAL, CKMB, CKMBINDEX, TROPONINI in the last 168 hours. BNP (last 3 results) No results for input(s): PROBNP in the last 8760 hours. CBG: Recent Labs  Lab 01/25/20 0416 01/25/20 0539 01/25/20 0858  GLUCAP 330* 153* 163*   D-Dimer: No results for input(s): DDIMER in the last 72 hours. Hgb A1c: No results for input(s): HGBA1C in the last 72 hours. Lipid Profile: No results for input(s): CHOL, HDL, LDLCALC, TRIG, CHOLHDL, LDLDIRECT in the last 72 hours. Thyroid function studies: No results for input(s): TSH, T4TOTAL, T3FREE, THYROIDAB in the last 72 hours.  Invalid input(s): FREET3 Anemia work up: No results for input(s): VITAMINB12, FOLATE, FERRITIN, TIBC, IRON, RETICCTPCT in the last 72 hours. Sepsis Labs: Recent Labs  Lab 01/24/20 2144 01/25/20 0229 01/25/20 0435 01/25/20 0503  WBC 14.2*  --   --  12.0*  LATICACIDVEN  --  2.1* 1.9  --     Microbiology Recent Results (from the past 240 hour(s))  Respiratory Panel by RT PCR (Flu A&B, Covid) - Nasopharyngeal Swab     Status: None   Collection Time: 01/25/20  6:11 AM    Specimen: Nasopharyngeal Swab  Result Value Ref Range Status   SARS Coronavirus 2 by RT PCR NEGATIVE NEGATIVE Final    Comment: (NOTE) SARS-CoV-2 target nucleic acids are NOT DETECTED. The SARS-CoV-2 RNA is generally detectable in upper respiratoy specimens during the acute phase of infection. The lowest concentration of SARS-CoV-2 viral copies this assay can detect is 131 copies/mL. A negative result does not preclude SARS-Cov-2 infection and should not be used as the sole basis for treatment or other  patient management decisions. A negative result may occur with  improper specimen collection/handling, submission of specimen other than nasopharyngeal swab, presence of viral mutation(s) within the areas targeted by this assay, and inadequate number of viral copies (<131 copies/mL). A negative result must be combined with clinical observations, patient history, and epidemiological information. The expected result is Negative. Fact Sheet for Patients:  PinkCheek.be Fact Sheet for Healthcare Providers:  GravelBags.it This test is not yet ap proved or cleared by the [place] FDA and  has been authorized for detection and/or diagnosis of SARS-CoV-2 by FDA under an Emergency Use Authorization (EUA). This EUA will remain  in effect (meaning this test can be used) for the duration of the COVID-19 declaration under Section 564(b)(1) of the Act, 21 U.S.C. section 360bbb-3(b)(1), unless the authorization is terminated or revoked sooner.    Influenza A by PCR NEGATIVE NEGATIVE Final   Influenza B by PCR NEGATIVE NEGATIVE Final    Comment: (NOTE) The Xpert Xpress SARS-CoV-2/FLU/RSV assay is intended as an aid in  the diagnosis of influenza from Nasopharyngeal swab specimens and  should not be used as a sole basis for treatment. Nasal washings and  aspirates are unacceptable for Xpert Xpress SARS-CoV-2/FLU/RSV  testing. Fact  Sheet for Patients: PinkCheek.be Fact Sheet for Healthcare Providers: GravelBags.it This test is not yet approved or cleared by the [place] FDA and  has been authorized for detection and/or diagnosis of SARS-CoV-2 by  FDA under an Emergency Use Authorization (EUA). This EUA will remain  in effect (meaning this test can be used) for the duration of the  Covid-19 declaration under Section 564(b)(1) of the Act, 21  U.S.C. section 360bbb-3(b)(1), unless the authorization is  terminated or revoked. Performed at [facility], [place] [address], [place], [place] [address]     Procedures and diagnostic studies:  DG ABD ACUTE 2+V W 1V CHEST  Result Date: 01/25/2020 CLINICAL DATA:  Nausea vomiting constipation EXAM: DG ABDOMEN ACUTE W/ 1V CHEST COMPARISON:  October 30, 2019 FINDINGS: There is no evidence of dilated bowel loops or free intraperitoneal air. Surgical clips seen within the left upper quadrant. No radiopaque calculi or other significant radiographic abnormality is seen. Heart size and mediastinal contours are within normal limits. Both lungs are clear. IMPRESSION: Negative abdominal radiographs.  No acute cardiopulmonary disease. Electronically Signed   By: Prudencio Pair M.D.   On: 01/25/2020 06:57    Medications:   . amLODipine  10 mg Oral Daily  . aspirin EC  81 mg Oral Daily  . atorvastatin  40 mg Oral QHS  . capsaicin   Topical BID  . enoxaparin (LOVENOX) injection  40 mg Subcutaneous Daily  . insulin aspart  0-5 Units Subcutaneous QHS  . insulin aspart  0-9 Units Subcutaneous TID WC  . insulin glargine  20 Units Subcutaneous Daily  . lisinopril  10 mg Oral Daily  . metoCLOPramide (REGLAN) injection  10 mg Intravenous Q8H  . mycophenolate  1,000 mg Oral BID  . tacrolimus  5 mg Oral BID   Continuous Infusions: . sodium chloride 125 mL/hr at 01/25/20 0941     LOS: 0 days   Radene Gunning NP  [facility]   How to contact the [facility] Attending or Consulting provider [place] or covering provider during after hours [place], for this patient?  1. Check the care team in [facility] and look for a) attending/consulting [facility] provider listed and b) the [facility] team listed 2. Log  into [email or web address] and use [place]'s universal password to access. If you do not have the password, please contact the hospital operator. 3. Locate the [facility] provider you are looking for under [facility] and page to a number that you can be directly reached. 4. If you still have difficulty reaching the provider, please page the [facility] (Director on Call) for the Hospitalists listed on amion for assistance.  01/25/2020, 10:03 AM

## 2020-01-25 NOTE — ED Notes (Signed)
Breakfast ordered 

## 2020-01-25 NOTE — ED Notes (Signed)
Pt provided w/ ice water.

## 2020-01-25 NOTE — Progress Notes (Signed)
Inpatient Diabetes Program Recommendations  AACE/ADA: New Consensus Statement on Inpatient Glycemic Control (2015)  Target Ranges:  Prepandial:   less than 140 mg/dL      Peak postprandial:   less than 180 mg/dL (1-2 hours)      Critically ill patients:  140 - 180 mg/dL   Lab Results  Component Value Date   GLUCAP 163 (H) 01/25/2020   HGBA1C 11.2 (H) 01/05/2018    Review of Glycemic Control  Diabetes history: DM type 1/ Kidney transplant Outpatient Diabetes medications: Lantus 20 units Current orders for Inpatient glycemic control: Lantus 20 units qhs, Novolog 0-9 units tid + hs  Inpatient Diabetes Program Recommendations:    Glucose trends in 100 range currently Endotool started transitioning to  SQ. Watch on current regimen ordered.  A1c pending. Will follow.  Thanks,  Christena Deem RN, MSN, BC-ADM Inpatient Diabetes Coordinator Team Pager [phone] (8a-5p)

## 2020-01-25 NOTE — ED Provider Notes (Signed)
Medical screening examination/treatment/procedure(s) were conducted as a shared visit with non-physician practitioner(s) and myself.  I personally evaluated the patient during the encounter.  Here with vomiting after MJ use, now with hyperglycemia and possibly early DKA. Started insulin infusion. Plan for admit.      Kayron Hicklin, [name] Cower, MD 01/26/20 [phone]

## 2020-01-26 LAB — GLUCOSE, CAPILLARY
Glucose-Capillary: 198 mg/dL — ABNORMAL HIGH (ref 70–99)
Glucose-Capillary: 251 mg/dL — ABNORMAL HIGH (ref 70–99)
Glucose-Capillary: 170 mg/dL — ABNORMAL HIGH (ref 70–99)

## 2020-01-26 LAB — CBC
HCT: 39.5 % (ref 39.0–52.0)
Hemoglobin: 13 g/dL (ref 13.0–17.0)
MCH: 26.7 pg (ref 26.0–34.0)
MCHC: 32.9 g/dL (ref 30.0–36.0)
MCV: 81.3 fL (ref 80.0–100.0)
Platelets: 98 10*3/uL — ABNORMAL LOW (ref 150–400)
RBC: 4.86 MIL/uL (ref 4.22–5.81)
RDW: 14.2 % (ref 11.5–15.5)
WBC: 7.4 10*3/uL (ref 4.0–10.5)
nRBC: 0 % (ref 0.0–0.2)

## 2020-01-26 LAB — BASIC METABOLIC PANEL
Anion gap: 10 (ref 5–15)
BUN: 17 mg/dL (ref 8–23)
CO2: 19 mmol/L — ABNORMAL LOW (ref 22–32)
Calcium: 8.2 mg/dL — ABNORMAL LOW (ref 8.9–10.3)
Chloride: 105 mmol/L (ref 98–111)
Creatinine, Ser: 1 mg/dL (ref 0.61–1.24)
GFR calc Af Amer: >60 mL/min (ref >60)
GFR calc non Af Amer: >60 mL/min (ref >60)
Glucose, Bld: 192 mg/dL — ABNORMAL HIGH (ref 70–99)
Potassium: 3 mmol/L — ABNORMAL LOW (ref 3.5–5.1)
Sodium: 134 mmol/L — ABNORMAL LOW (ref 135–145)

## 2020-01-26 MED ORDER — POTASSIUM CHLORIDE IN NACL 40-0.9 MEQ/L-% IV SOLN
INTRAVENOUS | Status: DC
  Filled 2020-01-26 (×3): qty 1000

## 2020-01-26 MED ORDER — SODIUM CHLORIDE 0.9 % IV SOLN: INTRAVENOUS | Status: DC

## 2020-01-26 MED ORDER — GLUCERNA SHAKE PO LIQD
237.0000 mL | Freq: Three times a day (TID) | ORAL | Status: DC
  Administered 2020-01-26 – 2020-01-27 (×2): 237 mL via ORAL

## 2020-01-26 MED ORDER — LABETALOL HCL 200 MG PO TABS
200.0000 mg | ORAL_TABLET | Freq: Two times a day (BID) | ORAL | Status: DC
  Administered 2020-01-26 – 2020-01-27 (×3): 200 mg via ORAL
  Filled 2020-01-26 (×3): qty 1

## 2020-01-26 MED ORDER — METOCLOPRAMIDE HCL 5 MG/ML IJ SOLN
10.0000 mg | Freq: Three times a day (TID) | INTRAMUSCULAR | Status: DC
  Administered 2020-01-26 – 2020-01-27 (×2): 10 mg via INTRAVENOUS
  Filled 2020-01-26 (×2): qty 2

## 2020-01-26 MED ORDER — POTASSIUM CHLORIDE CRYS ER 20 MEQ PO TBCR
40.0000 meq | EXTENDED_RELEASE_TABLET | ORAL | Status: AC
  Administered 2020-01-26 (×2): 40 meq via ORAL
  Filled 2020-01-26: qty 2

## 2020-01-26 MED ORDER — INSULIN GLARGINE 100 UNIT/ML ~~LOC~~ SOLN
14.0000 [IU] | Freq: Every day | SUBCUTANEOUS | Status: DC
  Administered 2020-01-26 – 2020-01-27 (×2): 14 [IU] via SUBCUTANEOUS
  Filled 2020-01-26 (×2): qty 0.14

## 2020-01-26 NOTE — Progress Notes (Addendum)
Inpatient Diabetes Program Recommendations  AACE/ADA: New Consensus Statement on Inpatient Glycemic Control (2015)  Target Ranges:  Prepandial:   less than 140 mg/dL      Peak postprandial:   less than 180 mg/dL (1-2 hours)      Critically ill patients:  140 - 180 mg/dL   Lab Results  Component Value Date   GLUCAP 198 (H) 01/26/2020   HGBA1C 10.0 (H) 01/25/2020    Review of Glycemic Control Results for MELANIE, PELLOT (MRN [id number]) as of 01/26/2020 08:39  Ref. Range 01/25/2020 11:54 01/25/2020 16:19 01/25/2020 21:21 01/26/2020 07:49  Glucose-Capillary Latest Ref Range: 70 - 99 mg/dL 008 (H) 676 (H) 195 (H) 198 (H)  Results for ANTWUAN, ECKLEY (MRN [id number]) as of 01/26/2020 08:39  Ref. Range 01/25/2020 16:38 01/25/2020 21:07  Beta-Hydroxybutyric Acid Latest Ref Range: 0.05 - 0.27 mmol/L 3.09 (H) 1.19 (H)   Diabetes history: Hx of Type 1 DM (kidney/pancreatic transplant) Outpatient Diabetes medications: Lantus 20 units QD Current orders for Inpatient glycemic control: Lantus 20 units QD, Novolog 0-9 units TID, Novolog 0-5 units QHS  Inpatient Diabetes Program Recommendations:    Noted consult.   Unclear as to why Endotool was discontinued after only 2 hours?  Patient was still acidotic at the time of discontinuation. Of note, patient refused basal dose at 1003 on 4/29.  Luckily, patient blood sugar and labs continued to show improvement, as patient has history of type 1 and only has received 8 units total of short acting insulin and no basal. Assuming this may related to history of pancreatic transplant.  Consider: Repeating beta hydroxybutyric acid and BMET. Decrease Lantus 14 units QD.   Discussed with Dr Jacqulyn Bath and Aurelio Brash NP.   Addendum: Spoke with patient regarding outpatient diabetes management. Verified outpatient dose, denies need for short acting insulin, hypos at home or missed dosages. Patient does not remember refusing basal insulin, instead states, "I cannot imagine  why I would have done that."  Reviewed patient's current A1c of 10.0%. Explained what a A1c is and what it measures. Also reviewed goal A1c with patient, importance of good glucose control @ home, and blood sugar goals. Reviewed patho of DM, need for insulin, role of pancreas, questioned if MD had discussed type of diabetes post pancreatic transplant, beta cell function, current inpatient trends, vascular changes, impact of Prograft/Cellcept, and other commorbidities.  Patient has a meter and supplies. Reports checking, but could do better and more frequent with this. Reviewed recommended frequency.  Patient admits to recently drinking Ginger ale more than usual. Counseled on cessation and reviewed goals, plate method and counting carbohydrates.  Encouraged close follow up with PCP and making an ew appointment with endocrinology. Patient has been seen by Dr Lucianne Muss in the past, however, it was "several years ago." Will attach endo list to DC summary. Patient has no further questions at this time.   Thanks, Lujean Rave, MSN, RNC-OB Diabetes Coordinator [phone] (8a-5p)

## 2020-01-26 NOTE — Progress Notes (Signed)
   01/26/20 1532  Clinical Encounter Type  Visited With Patient  Visit Type Initial  Referral From Nurse  Consult/Referral To Chaplain  Spiritual Encounters  Spiritual Needs Literature;Other (Comment) (AD Education)   Chaplain delivered Advanced directive paperwork. [name] was napping but said he was happy to receive the paperwork and would read over it this weekend. Spiritual Care office notary available on Monday Jan 29, 2020. Chaplains remain available for support as needs arise.   Chaplain Resident, Amado Coe, M Div [phone] new on-call pager

## 2020-01-26 NOTE — Progress Notes (Signed)
Progress Note    Deuntae Kocsis  BJS:[id number] DOB: July 12, 1951  DOA: 01/24/2020 PCP: Patient, No Pcp Per    Brief Narrative:    Medical records reviewed and are as summarized below:  Uno Esau is an 69 y.o. male with a past medical history that includes diabetes type 1, renal transplant, left BKA, hypertension, hyperlipidemia, marijuana abuse, admitted with intractable nausea vomiting in the setting of DKA and marijuana use.  Is provided with IV fluids IV insulin.  Assessment/Plan:   Principal Problem:   DKA (diabetic ketoacidoses) (HCC) Active Problems:   Essential hypertension   Nausea and vomiting   Kidney transplant recipient   #1.  DKA.  And history of kidney/pancreatic transplant.  History of noncompliance.  Patient provided with IV insulin.  Anion gap closed quickly.  Of note he was taken off protocol early.  Home medications include Lantus.  Lantus resumed but patient refused.  This morning his anion gap is 10 serum glucose 192. -Resume Lantus at a lower dose -Sliding scale insulin -We will repeat labs per recommendation diabetes coordinator -Monitor closely  2.  Nausea and vomiting.  Patient has a history of intractable nausea and vomiting likely related to cannabinoid hyperemesis syndrome and/or gastroparesis.  Abdominal x-ray negative.  He was provided with Reglan and Zofran.  Patient was feeling better this morning and consumed clear liquid breakfast.  An hour later had 2 episodes of large amount of emesis of undigested food. -Continue Reglan -Continue clear liquids with advancing as tolerated -Gentle IV fluids -Monitor  #3.  Hypertension.  Improved control since his home meds were resumed.  Home meds include amlodipine lisinopril labetalol.  Labetalol was held initially waiting urine drug screen. -resume labetolol -Continue lisinopril and amlodipine -Monitor  #4.  Status post kidney/pancreatic transplant.  Home medications include CellCept and Prograf.   Creatinine stable -Continue home meds  #5.  Diabetes type 1. -See #1   Family Communication/Anticipated D/C date and plan/Code Status   DVT prophylaxis: Lovenox ordered. Code Status: Full Code.  Family Communication:  Disposition Plan: Status is: Inpatient  Remains inpatient appropriate because:IV treatments appropriate due to intensity of illness or inability to take PO   Dispo: The patient is from: Home              Anticipated d/c is to: Home              Anticipated d/c date is: 1 day              Patient currently is not medically stable to d/c.          Medical Consultants:    None.   Anti-Infectives:    None  Subjective:   Awake alert reports feeling better.  He ordered himself some clear liquids for breakfast thinking he be able to tolerate.  He ate clear liquid breakfast and then had 2 episodes of large amount emesis.  Objective:    Vitals:   01/25/20 2118 01/26/20 0000 01/26/20 0400 01/26/20 0800  BP: 135/67 (!) 124/58 (!) 151/74   Pulse: 85 79 87 95  Resp: 20 18 18 19   Temp: 98.4 F (36.9 C) 98.3 F (36.8 C) 98.9 F (37.2 C)   TempSrc: Oral Oral Oral   SpO2:  96% 99% 99%  Weight:      Height:        Intake/Output Summary (Last 24 hours) at 01/26/2020 1146 Last data filed at 01/26/2020 0555 Gross per 24 hour  Intake 2687  ml  Output 880 ml  Net 1807 ml   Filed Weights   01/24/20 2104 01/24/20 2131 01/25/20 1551  Weight: 79.4 kg 77.1 kg 77.1 kg    Exam: General: Well-nourished no acute distress CV: Regular rate and rhythm no murmur gallop or rub status post left below the knee amputation no edema on the right Respiratory: No increased work of breathing breath sounds are clear bilaterally I hear no wheeze no rhonchi Abdomen: Soft positive bowel sounds but sluggish nontender to palpation no guarding or rebounding Musculoskeletal: Left below the knee amputation otherwise joints without swelling/erythema Neuro: Awake alert oriented x3  pleasant cooperative speech clear facial symmetry  Data Reviewed:   I have personally reviewed following labs and imaging studies:  Labs: Labs show the following:   Basic Metabolic Panel: Recent Labs  Lab 01/24/20 2144 01/24/20 2144 01/25/20 0323 01/25/20 0323 01/25/20 0503 01/25/20 0503 01/25/20 0543 01/25/20 0543 01/25/20 0904 01/26/20 0358  NA 134*   < > 126*  --  139  --  132*  --  138 134*  K 4.0   < > >8.5*   < > 5.2*   < > 3.8   < > 3.8 3.0*  CL 94*  --   --   --  106  --   --   --  101 105  CO2 22  --   --   --  18*  --   --   --  18* 19*  GLUCOSE 340*  --   --   --  130*  --   --   --  215* 192*  BUN 28*  --   --   --  28*  --   --   --  24* 17  CREATININE 1.24  --   --   --  1.11  --   --   --  1.20 1.00  CALCIUM 9.7  --   --   --  8.4*  --   --   --  9.0 8.2*   < > = values in this interval not displayed.   GFR Estimated Creatinine Clearance: 74 mL/min (by C-G formula based on SCr of 1 mg/dL). Liver Function Tests: Recent Labs  Lab 01/24/20 2144  AST 12*  ALT 15  ALKPHOS 117  BILITOT 1.4*  PROT 7.5  ALBUMIN 4.7   Recent Labs  Lab 01/24/20 2144  LIPASE 15   No results for input(s): AMMONIA in the last 168 hours. Coagulation profile No results for input(s): INR, PROTIME in the last 168 hours.  CBC: Recent Labs  Lab 01/24/20 2144 01/25/20 0323 01/25/20 0503 01/25/20 0543 01/26/20 0358  WBC 14.2*  --  12.0*  --  7.4  HGB 16.7 17.7* 14.7 16.7 13.0  HCT 52.4* 52.0 45.5 49.0 39.5  MCV 81.9  --  83.3  --  81.3  PLT 162  --  119*  --  98*   Cardiac Enzymes: No results for input(s): CKTOTAL, CKMB, CKMBINDEX, TROPONINI in the last 168 hours. BNP (last 3 results) No results for input(s): PROBNP in the last 8760 hours. CBG: Recent Labs  Lab 01/25/20 0858 01/25/20 1154 01/25/20 1619 01/25/20 2121 01/26/20 0749  GLUCAP 163* 232* 258* 181* 198*   D-Dimer: No results for input(s): DDIMER in the last 72 hours. Hgb A1c: Recent Labs     01/25/20 1638  HGBA1C 10.0*   Lipid Profile: No results for input(s): CHOL, HDL, LDLCALC, TRIG, CHOLHDL, LDLDIRECT in the  last 72 hours. Thyroid function studies: No results for input(s): TSH, T4TOTAL, T3FREE, THYROIDAB in the last 72 hours.  Invalid input(s): FREET3 Anemia work up: No results for input(s): VITAMINB12, FOLATE, FERRITIN, TIBC, IRON, RETICCTPCT in the last 72 hours. Sepsis Labs: Recent Labs  Lab 01/24/20 2144 01/25/20 0229 01/25/20 0435 01/25/20 0503 01/26/20 0358  WBC 14.2*  --   --  12.0* 7.4  LATICACIDVEN  --  2.1* 1.9  --   --     Microbiology Recent Results (from the past 240 hour(s))  Respiratory Panel by RT PCR (Flu A&B, Covid) - Nasopharyngeal Swab     Status: None   Collection Time: 01/25/20  6:11 AM   Specimen: Nasopharyngeal Swab  Result Value Ref Range Status   SARS Coronavirus 2 by RT PCR NEGATIVE NEGATIVE Final    Comment: (NOTE) SARS-CoV-2 target nucleic acids are NOT DETECTED. The SARS-CoV-2 RNA is generally detectable in upper respiratoy specimens during the acute phase of infection. The lowest concentration of SARS-CoV-2 viral copies this assay can detect is 131 copies/mL. A negative result does not preclude SARS-Cov-2 infection and should not be used as the sole basis for treatment or other patient management decisions. A negative result may occur with  improper specimen collection/handling, submission of specimen other than nasopharyngeal swab, presence of viral mutation(s) within the areas targeted by this assay, and inadequate number of viral copies (<131 copies/mL). A negative result must be combined with clinical observations, patient history, and epidemiological information. The expected result is Negative. Fact Sheet for Patients:  https://www.moore.com/ Fact Sheet for Healthcare Providers:  https://www.young.biz/ This test is not yet ap proved or cleared by the [place] FDA and  has  been authorized for detection and/or diagnosis of SARS-CoV-2 by FDA under an Emergency Use Authorization (EUA). This EUA will remain  in effect (meaning this test can be used) for the duration of the COVID-19 declaration under Section 564(b)(1) of the Act, 21 U.S.C. section 360bbb-3(b)(1), unless the authorization is terminated or revoked sooner.    Influenza A by PCR NEGATIVE NEGATIVE Final   Influenza B by PCR NEGATIVE NEGATIVE Final    Comment: (NOTE) The Xpert Xpress SARS-CoV-2/FLU/RSV assay is intended as an aid in  the diagnosis of influenza from Nasopharyngeal swab specimens and  should not be used as a sole basis for treatment. Nasal washings and  aspirates are unacceptable for Xpert Xpress SARS-CoV-2/FLU/RSV  testing. Fact Sheet for Patients: https://www.moore.com/ Fact Sheet for Healthcare Providers: https://www.young.biz/ This test is not yet approved or cleared by the [place] FDA and  has been authorized for detection and/or diagnosis of SARS-CoV-2 by  FDA under an Emergency Use Authorization (EUA). This EUA will remain  in effect (meaning this test can be used) for the duration of the  Covid-19 declaration under Section 564(b)(1) of the Act, 21  U.S.C. section 360bbb-3(b)(1), unless the authorization is  terminated or revoked. Performed at [facility], [address], [place], [place] [address]     Procedures and diagnostic studies:  DG ABD ACUTE 2+V W 1V CHEST  Result Date: 01/25/2020 CLINICAL DATA:  Nausea vomiting constipation EXAM: DG ABDOMEN ACUTE W/ 1V CHEST COMPARISON:  October 30, 2019 FINDINGS: There is no evidence of dilated bowel loops or free intraperitoneal air. Surgical clips seen within the left upper quadrant. No radiopaque calculi or other significant radiographic abnormality is seen. Heart size and mediastinal contours are within normal limits. Both lungs are clear. IMPRESSION: Negative abdominal  radiographs.  No acute cardiopulmonary disease. Electronically Signed   By: Prudencio Pair M.D.   On: 01/25/2020 06:57    Medications:   . amLODipine  10 mg Oral Daily  . aspirin EC  81 mg Oral Daily  . atorvastatin  40 mg Oral QHS  . capsaicin   Topical BID  . enoxaparin (LOVENOX) injection  40 mg Subcutaneous Daily  . insulin aspart  0-5 Units Subcutaneous QHS  . insulin aspart  0-9 Units Subcutaneous TID WC  . insulin glargine  14 Units Subcutaneous Daily  . labetalol  200 mg Oral BID  . lisinopril  10 mg Oral Daily  . metoCLOPramide (REGLAN) injection  10 mg Intravenous Q8H  . mycophenolate  1,000 mg Oral BID  . potassium chloride  40 mEq Oral Q4H  . tacrolimus  5 mg Oral BID   Continuous Infusions: . sodium chloride 125 mL/hr at 01/26/20 0032     LOS: 1 day   Radene Gunning NP  [facility]   How to contact the [facility] Attending or Consulting provider [place] or covering provider during after hours [place], for this patient?  1. Check the care team in [facility] and look for a) attending/consulting [facility] provider listed and b) the [facility] team listed 2. Log into [email or web address] and use [place]'s universal password to access. If you do not have the password, please contact the hospital operator. 3. Locate the [facility] provider you are looking for under [facility] and page to a number that you can be directly reached. 4. If you still have difficulty reaching the provider, please page the [facility] (Director on Call) for the Hospitalists listed on amion for assistance.  01/26/2020, 11:46 AM

## 2020-01-26 NOTE — Progress Notes (Signed)
   01/26/20 1300  Clinical Encounter Type  Visited With Health care provider  Visit Type Initial  Referral From Nurse  Consult/Referral To Chaplain  Spiritual Encounters  Spiritual Needs Literature   Chaplain is aware of need for advanced directive. Chaplain will deliver the paperwork and assist with education. Chaplain services does not have a notary available until Monday May 3,2021. If AD notarizing is needed prior to Monday social work may be able to assist. Chaplains remain available for support as needs arise.   Chaplain Resident, Amado Coe, M Div [phone] new on-call pager

## 2020-01-26 NOTE — Discharge Instructions (Signed)
Local Endocrinologists [facility] ([phone]) 1. Dr. Cristina Gherghe 2. Dr. Ajay Kumar [facility] ([phone]) 1. Dr. Jeffrey Kerr [facility] ([phone]) 1. Dr. Bindubal Balan 2. Dr. Walter Kohut [facility] ([phone]) 1. Dr. Stephen [facility] [phone]) [[place] office]  ([phone]) [[place] office] 1. Dr. Melissa Solum 2. Dr. Thomas O'Connell [facility] ([facility]) ([phone]) 1. Autumn Hudnall ([place]), PA 2. Dr. Dhaval Patel 3. Dr. William Weekly Jr. [facility] ([phone]) 1. Dr. Gebre Nida [facility] [phone]) 1. Dr. Micheal Brennan 2. Dr. Jennifer Badik 3. Dr. Ashley Jessup 4. Spencer Beasley, FNP Dr. Monica E. Doerr in [place] ([phone]) 

## 2020-01-26 NOTE — Progress Notes (Signed)
Initial Nutrition Assessment  DOCUMENTATION CODES:   Not applicable  INTERVENTION:  Provide Glucerna Shake po TID, each supplement provides 220 kcal and 10 grams of protein.  Encourage adequate PO intake.   NUTRITION DIAGNOSIS:   Inadequate oral intake related to nausea, vomiting as evidenced by per patient/family report.  GOAL:   Patient will meet greater than or equal to 90% of their needs  MONITOR:   PO intake, Supplement acceptance, Skin, Weight trends, Labs, I & O's  REASON FOR ASSESSMENT:   Malnutrition Screening Tool    ASSESSMENT:   69 y.o. male with a past medical history that includes diabetes type 1, renal transplant, left BKA, hypertension, hyperlipidemia, marijuana abuse, admitted with intractable nausea vomiting in the setting of DKA and marijuana use.  Diet advanced to a soft diet. Pt reports abdominal pains have improved. Pt reports eating well PTA with usual consumption of at least 3 meals a day with no difficulties. RD to order nutritional supplements to aid in caloric and protein needs. Pt encouraged to eat his food at meals and to drink his supplements.   NUTRITION - FOCUSED PHYSICAL EXAM:    Most Recent Value  Orbital Region  No depletion  Upper Arm Region  No depletion  Thoracic and Lumbar Region  No depletion  Buccal Region  No depletion  Temple Region  No depletion  Clavicle Bone Region  No depletion  Clavicle and Acromion Bone Region  No depletion  Scapular Bone Region  No depletion  Dorsal Hand  No depletion  Patellar Region  No depletion  Anterior Thigh Region  No depletion  Posterior Calf Region  No depletion  Edema (RD Assessment)  None  Hair  Reviewed  Eyes  Reviewed  Mouth  Reviewed  Skin  Reviewed  Nails  Reviewed       Labs and medications reviewed.   Diet Order:   Diet Order            DIET SOFT Room service appropriate? Yes; Fluid consistency: Thin  Diet effective now              EDUCATION NEEDS:   Not  appropriate for education at this time  Skin:  Skin Assessment: Reviewed RN Assessment  Last BM:  4/30  Height:   Ht Readings from Last 1 Encounters:  01/25/20 5\' 10"  (1.778 m)    Weight:   Wt Readings from Last 1 Encounters:  01/25/20 77.1 kg    BMI:  Body mass index is 24.39 kg/m.  Estimated Nutritional Needs:   Kcal:  1900-2100  Protein:  95-105 grams  Fluid:  >/= 1.9 L/day   01/27/20, MS, RD, LDN RD pager number/after hours weekend pager number on Amion.

## 2020-01-27 LAB — BASIC METABOLIC PANEL
Anion gap: 9 (ref 5–15)
BUN: 9 mg/dL (ref 8–23)
CO2: 19 mmol/L — ABNORMAL LOW (ref 22–32)
Calcium: 8.2 mg/dL — ABNORMAL LOW (ref 8.9–10.3)
Chloride: 108 mmol/L (ref 98–111)
Creatinine, Ser: 0.78 mg/dL (ref 0.61–1.24)
GFR calc Af Amer: >60 mL/min (ref >60)
GFR calc non Af Amer: >60 mL/min (ref >60)
Glucose, Bld: 148 mg/dL — ABNORMAL HIGH (ref 70–99)
Potassium: 3.6 mmol/L (ref 3.5–5.1)
Sodium: 136 mmol/L (ref 135–145)

## 2020-01-27 LAB — BETA-HYDROXYBUTYRIC ACID: Beta-Hydroxybutyric Acid: 1.17 mmol/L — ABNORMAL HIGH (ref 0.05–0.27)

## 2020-01-27 LAB — GLUCOSE, CAPILLARY: Glucose-Capillary: 158 mg/dL — ABNORMAL HIGH (ref 70–99)

## 2020-01-27 NOTE — Discharge Summary (Signed)
Physician Discharge Summary  [name] JSE:[id number] DOB: [birth year] DOA: 01/24/2020  PCP: Patient, No Pcp Per  Admit date: 01/24/2020 Discharge date: 01/27/2020  Admitted From: Home Disposition: Home  Recommendations for Outpatient Follow-up:  1. Follow up with PCP in 1-2 weeks 2. Follow with endocrinologist within 1 to 2 weeks 3. Please obtain BMP/CBC in one week 4. Please follow up on the following pending results:  Home Health: None Equipment/Devices: None  Discharge Condition: Stable CODE STATUS: Full code Diet recommendation: Diabetic  Subjective: Seen and examined.  Feels much better.  No nausea or vomiting since the one he had yesterday around noon.  Wants to go home as soon as possible.  Brief/Interim Summary: [name] is an 69 y.o. male with a past medical history that includes diabetes type 1, renal transplant, left BKA, hypertension, hyperlipidemia, marijuana abuse, admitted with intractable nausea vomiting in the setting of DKA and marijuana use.  He was admitted to hospital service.  Started on IV fluids and insulin per DKA protocol.  Soon his DKA resolved.  Patient was started on clear liquid diet which he initially tolerated but then he had some vomiting.  He was then feeling better yesterday in the evening so his diet was advanced to soft and he has tolerated well without having any symptoms.  When seen today, he feels much better.  With no symptoms.  Wants to go home.  His nausea vomiting could be secondary to combination of DKA and possible gastroparesis.  He has been advised to stick to his home regimen for Lantus as well as Reglan.  He verbalized understanding.  Follow-up with PCP and endocrinology.  Of note, he claims to be compliant with his home regime of Lantus 20 units and despite of that his hemoglobin A1c is 10 while diabetes coordinator has recommended to reduce his dose to 14 units for some reason.  Possible discharge him on home dose of Lantus which is  20 units.  He might need this dosage to be increased and not decreased however will defer that to his PCP/endocrinologist.  Discharge Diagnoses:  Principal Problem:   DKA (diabetic ketoacidoses) ([place]) Active Problems:   Essential hypertension   Nausea and vomiting   Kidney transplant recipient    Discharge Instructions   Allergies as of 01/27/2020   No Known Allergies     Medication List    STOP taking these medications   insulin detemir 100 UNIT/ML injection Commonly known as: LEVEMIR     TAKE these medications   acetaminophen 325 MG tablet Commonly known as: TYLENOL Take 970 mg by mouth 3 (three) times daily.   amLODipine 10 MG tablet Commonly known as: NORVASC Take 10 mg by mouth daily.   aspirin EC 81 MG tablet Take 81 mg by mouth daily.   atorvastatin 80 MG tablet Commonly known as: LIPITOR Take 40 mg by mouth at bedtime.   furosemide 20 MG tablet Commonly known as: LASIX Take 1 tablet (20 mg total) by mouth daily.   insulin glargine 100 UNIT/ML injection Commonly known as: LANTUS Inject 20 Units into the skin daily.   labetalol 100 MG tablet Commonly known as: NORMODYNE Take 2 tablets (200 mg total) by mouth 2 (two) times daily.   lisinopril 10 MG tablet Commonly known as: ZESTRIL Take 1 tablet (10 mg total) by mouth daily.   metoCLOPramide 5 MG tablet Commonly known as: Reglan Take 1 tablet (5 mg total) by mouth 3 (three) times daily for 3 days.  mycophenolate 250 MG capsule Commonly known as: CELLCEPT Take 1,000 mg by mouth 2 (two) times daily.   ondansetron 4 MG tablet Commonly known as: ZOFRAN Take 1 tablet (4 mg total) by mouth every 8 (eight) hours as needed for nausea or vomiting.   pantoprazole 40 MG tablet Commonly known as: PROTONIX Take 1 tablet (40 mg total) by mouth daily.   Potassium Chloride ER 20 MEQ Tbcr Take 20 mEq by mouth daily.   senna 8.6 MG Tabs tablet Commonly known as: SENOKOT Take 1 tablet by mouth 2 (two)  times daily.   silver sulfADIAZINE 1 % cream Commonly known as: SILVADENE Apply 1 application topically daily.   tacrolimus 5 MG capsule Commonly known as: PROGRAF Take 5 mg by mouth 2 (two) times daily.   traZODone 50 MG tablet Commonly known as: DESYREL Take 50 mg by mouth at bedtime as needed for sleep.       No Known Allergies  Consultations: None   Procedures/Studies: DG ABD ACUTE 2+V W 1V CHEST  Result Date: 01/25/2020 CLINICAL DATA:  Nausea vomiting constipation EXAM: DG ABDOMEN ACUTE W/ 1V CHEST COMPARISON:  October 30, 2019 FINDINGS: There is no evidence of dilated bowel loops or free intraperitoneal air. Surgical clips seen within the left upper quadrant. No radiopaque calculi or other significant radiographic abnormality is seen. Heart size and mediastinal contours are within normal limits. Both lungs are clear. IMPRESSION: Negative abdominal radiographs.  No acute cardiopulmonary disease. Electronically Signed   By: Jonna ClarkBindu  Avutu M.D.   On: 01/25/2020 06:57      Discharge Exam: Vitals:   01/27/20 0622 01/27/20 0623  BP: (!) 161/68   Pulse: 84   Resp:  18  Temp:  98.7 F (37.1 C)  SpO2: 98%    Vitals:   01/26/20 0800 01/26/20 2026 01/27/20 0622 01/27/20 0623  BP:  (!) 161/70 (!) 161/68   Pulse: 95 89 84   Resp: 19 18  18   Temp:  98.4 F (36.9 C)  98.7 F (37.1 C)  TempSrc:  Oral    SpO2: 99%  98%   Weight:      Height:        General: Pt is alert, awake, not in acute distress Cardiovascular: RRR, S1/S2 +, no rubs, no gallops Respiratory: CTA bilaterally, no wheezing, no rhonchi Abdominal: Soft, NT, ND, bowel sounds + Extremities: no edema, no cyanosis    The results of significant diagnostics from this hospitalization (including imaging, microbiology, ancillary and laboratory) are listed below for reference.     Microbiology: Recent Results (from the past 240 hour(s))  Respiratory Panel by RT PCR (Flu A&B, Covid) - Nasopharyngeal Swab      Status: None   Collection Time: 01/25/20  6:11 AM   Specimen: Nasopharyngeal Swab  Result Value Ref Range Status   SARS Coronavirus 2 by RT PCR NEGATIVE NEGATIVE Final    Comment: (NOTE) SARS-CoV-2 target nucleic acids are NOT DETECTED. The SARS-CoV-2 RNA is generally detectable in upper respiratoy specimens during the acute phase of infection. The lowest concentration of SARS-CoV-2 viral copies this assay can detect is 131 copies/mL. A negative result does not preclude SARS-Cov-2 infection and should not be used as the sole basis for treatment or other patient management decisions. A negative result may occur with  improper specimen collection/handling, submission of specimen other than nasopharyngeal swab, presence of viral mutation(s) within the areas targeted by this assay, and inadequate number of viral copies (<131 copies/mL). A negative result  must be combined with clinical observations, patient history, and epidemiological information. The expected result is Negative. Fact Sheet for Patients:  https://www.moore.com/ Fact Sheet for Healthcare Providers:  https://www.young.biz/ This test is not yet ap proved or cleared by the [place] FDA and  has been authorized for detection and/or diagnosis of SARS-CoV-2 by FDA under an Emergency Use Authorization (EUA). This EUA will remain  in effect (meaning this test can be used) for the duration of the COVID-19 declaration under Section 564(b)(1) of the Act, 21 U.S.C. section 360bbb-3(b)(1), unless the authorization is terminated or revoked sooner.    Influenza A by PCR NEGATIVE NEGATIVE Final   Influenza B by PCR NEGATIVE NEGATIVE Final    Comment: (NOTE) The Xpert Xpress SARS-CoV-2/FLU/RSV assay is intended as an aid in  the diagnosis of influenza from Nasopharyngeal swab specimens and  should not be used as a sole basis for treatment. Nasal washings and  aspirates are unacceptable for  Xpert Xpress SARS-CoV-2/FLU/RSV  testing. Fact Sheet for Patients: https://www.moore.com/ Fact Sheet for Healthcare Providers: https://www.young.biz/ This test is not yet approved or cleared by the [place] FDA and  has been authorized for detection and/or diagnosis of SARS-CoV-2 by  FDA under an Emergency Use Authorization (EUA). This EUA will remain  in effect (meaning this test can be used) for the duration of the  Covid-19 declaration under Section 564(b)(1) of the Act, 21  U.S.C. section 360bbb-3(b)(1), unless the authorization is  terminated or revoked. Performed at [facility], [address], [place], [place] [address]      Labs: BNP (last 3 results) No results for input(s): BNP in the last 8760 hours. Basic Metabolic Panel: Recent Labs  Lab 01/24/20 2144 01/25/20 0323 01/25/20 0503 01/25/20 0543 01/25/20 0904 01/26/20 0358 01/27/20 0449  NA 134*   < > 139 132* 138 134* 136  K 4.0   < > 5.2* 3.8 3.8 3.0* 3.6  CL 94*  --  106  --  101 105 108  CO2 22  --  18*  --  18* 19* 19*  GLUCOSE 340*  --  130*  --  215* 192* 148*  BUN 28*  --  28*  --  24* 17 9  CREATININE 1.24  --  1.11  --  1.20 1.00 0.78  CALCIUM 9.7  --  8.4*  --  9.0 8.2* 8.2*   < > = values in this interval not displayed.   Liver Function Tests: Recent Labs  Lab 01/24/20 2144  AST 12*  ALT 15  ALKPHOS 117  BILITOT 1.4*  PROT 7.5  ALBUMIN 4.7   Recent Labs  Lab 01/24/20 2144  LIPASE 15   No results for input(s): AMMONIA in the last 168 hours. CBC: Recent Labs  Lab 01/24/20 2144 01/25/20 0323 01/25/20 0503 01/25/20 0543 01/26/20 0358  WBC 14.2*  --  12.0*  --  7.4  HGB 16.7 17.7* 14.7 16.7 13.0  HCT 52.4* 52.0 45.5 49.0 39.5  MCV 81.9  --  83.3  --  81.3  PLT 162  --  119*  --  98*   Cardiac Enzymes: No results for input(s): CKTOTAL, CKMB, CKMBINDEX, TROPONINI in the last 168 hours. BNP: Invalid input(s): POCBNP CBG: Recent  Labs  Lab 01/25/20 2121 01/26/20 0749 01/26/20 1225 01/26/20 2053 01/27/20 0749  GLUCAP 181* 198* 251* 170* 158*   D-Dimer No results for input(s): DDIMER in the last 72 hours. Hgb A1c Recent Labs    01/25/20  1638  HGBA1C 10.0*   Lipid Profile No results for input(s): CHOL, HDL, LDLCALC, TRIG, CHOLHDL, LDLDIRECT in the last 72 hours. Thyroid function studies No results for input(s): TSH, T4TOTAL, T3FREE, THYROIDAB in the last 72 hours.  Invalid input(s): FREET3 Anemia work up No results for input(s): VITAMINB12, FOLATE, FERRITIN, TIBC, IRON, RETICCTPCT in the last 72 hours. Urinalysis    Component Value Date/Time   COLORURINE YELLOW 01/24/2020 2141   APPEARANCEUR CLEAR 01/24/2020 2141   LABSPEC 1.027 01/24/2020 2141   PHURINE 6.0 01/24/2020 2141   GLUCOSEU >=500 (A) 01/24/2020 2141   HGBUR SMALL (A) 01/24/2020 2141   BILIRUBINUR NEGATIVE 01/24/2020 2141   KETONESUR 80 (A) 01/24/2020 2141   PROTEINUR >=300 (A) 01/24/2020 2141   UROBILINOGEN 1.0 09/29/2013 2339   NITRITE NEGATIVE 01/24/2020 2141   LEUKOCYTESUR NEGATIVE 01/24/2020 2141   Sepsis Labs Invalid input(s): PROCALCITONIN,  WBC,  LACTICIDVEN Microbiology Recent Results (from the past 240 hour(s))  Respiratory Panel by RT PCR (Flu A&B, Covid) - Nasopharyngeal Swab     Status: None   Collection Time: 01/25/20  6:11 AM   Specimen: Nasopharyngeal Swab  Result Value Ref Range Status   SARS Coronavirus 2 by RT PCR NEGATIVE NEGATIVE Final    Comment: (NOTE) SARS-CoV-2 target nucleic acids are NOT DETECTED. The SARS-CoV-2 RNA is generally detectable in upper respiratoy specimens during the acute phase of infection. The lowest concentration of SARS-CoV-2 viral copies this assay can detect is 131 copies/mL. A negative result does not preclude SARS-Cov-2 infection and should not be used as the sole basis for treatment or other patient management decisions. A negative result may occur with  improper specimen  collection/handling, submission of specimen other than nasopharyngeal swab, presence of viral mutation(s) within the areas targeted by this assay, and inadequate number of viral copies (<131 copies/mL). A negative result must be combined with clinical observations, patient history, and epidemiological information. The expected result is Negative. Fact Sheet for Patients:  https://www.moore.com/ Fact Sheet for Healthcare Providers:  https://www.young.biz/ This test is not yet ap proved or cleared by the [place] FDA and  has been authorized for detection and/or diagnosis of SARS-CoV-2 by FDA under an Emergency Use Authorization (EUA). This EUA will remain  in effect (meaning this test can be used) for the duration of the COVID-19 declaration under Section 564(b)(1) of the Act, 21 U.S.C. section 360bbb-3(b)(1), unless the authorization is terminated or revoked sooner.    Influenza A by PCR NEGATIVE NEGATIVE Final   Influenza B by PCR NEGATIVE NEGATIVE Final    Comment: (NOTE) The Xpert Xpress SARS-CoV-2/FLU/RSV assay is intended as an aid in  the diagnosis of influenza from Nasopharyngeal swab specimens and  should not be used as a sole basis for treatment. Nasal washings and  aspirates are unacceptable for Xpert Xpress SARS-CoV-2/FLU/RSV  testing. Fact Sheet for Patients: https://www.moore.com/ Fact Sheet for Healthcare Providers: https://www.young.biz/ This test is not yet approved or cleared by the [place] FDA and  has been authorized for detection and/or diagnosis of SARS-CoV-2 by  FDA under an Emergency Use Authorization (EUA). This EUA will remain  in effect (meaning this test can be used) for the duration of the  Covid-19 declaration under Section 564(b)(1) of the Act, 21  U.S.C. section 360bbb-3(b)(1), unless the authorization is  terminated or revoked. Performed at [facility], [address] [place], [place] [address]      Time coordinating discharge: Over 30 minutes  SIGNED:   Daleen Bo  Lyndsee Casa, MD  [facility] 01/27/2020, 9:22 AM  If 7PM-7AM, please contact night-coverage [email or web address]

## 2020-01-29 LAB — POCT I-STAT EG7
Acid-base deficit: 4 mmol/L — ABNORMAL HIGH (ref 0.0–2.0)
Bicarbonate: 16.4 mmol/L — ABNORMAL LOW (ref 20.0–28.0)
Calcium, Ion: 0.78 mmol/L — CL (ref 1.15–1.40)
HCT: 52 % (ref 39.0–52.0)
Hemoglobin: 17.7 g/dL — ABNORMAL HIGH (ref 13.0–17.0)
O2 Saturation: 98 %
Patient temperature: 37
Potassium: >8.5 mmol/L (ref 3.5–5.1)
Sodium: 126 mmol/L — ABNORMAL LOW (ref 135–145)
TCO2: 17 mmol/L — ABNORMAL LOW (ref 22–32)
pCO2, Ven: 21.9 mmHg — ABNORMAL LOW (ref 44.0–60.0)
pH, Ven: 7.484 — ABNORMAL HIGH (ref 7.250–7.430)
pO2, Ven: 102 mmHg — ABNORMAL HIGH (ref 32.0–45.0)

## 2020-01-30 ENCOUNTER — Emergency Department (HOSPITAL_COMMUNITY): Payer: Medicare Other

## 2020-01-30 ENCOUNTER — Other Ambulatory Visit: Payer: Self-pay

## 2020-01-30 ENCOUNTER — Emergency Department (HOSPITAL_COMMUNITY)
Admission: EM | Admit: 2020-01-30 | Discharge: 2020-01-30 | Disposition: A | Discharge status: Home / Self Care | Payer: Medicare Other | Attending: Emergency Medicine | Admitting: Emergency Medicine

## 2020-01-30 ENCOUNTER — Encounter (HOSPITAL_COMMUNITY): Payer: Self-pay

## 2020-01-30 ENCOUNTER — Other Ambulatory Visit: Payer: Self-pay | Admitting: *Deleted

## 2020-01-30 DIAGNOSIS — I10 Essential (primary) hypertension: Secondary | ICD-10-CM | POA: Insufficient documentation

## 2020-01-30 DIAGNOSIS — Z79899 Other long term (current) drug therapy: Secondary | ICD-10-CM | POA: Insufficient documentation

## 2020-01-30 DIAGNOSIS — Y999 Unspecified external cause status: Secondary | ICD-10-CM | POA: Insufficient documentation

## 2020-01-30 DIAGNOSIS — Y9389 Activity, other specified: Secondary | ICD-10-CM | POA: Diagnosis not present

## 2020-01-30 DIAGNOSIS — E109 Type 1 diabetes mellitus without complications: Secondary | ICD-10-CM | POA: Insufficient documentation

## 2020-01-30 DIAGNOSIS — W19XXXA Unspecified fall, initial encounter: Secondary | ICD-10-CM

## 2020-01-30 DIAGNOSIS — Z940 Kidney transplant status: Secondary | ICD-10-CM | POA: Insufficient documentation

## 2020-01-30 DIAGNOSIS — Z87891 Personal history of nicotine dependence: Secondary | ICD-10-CM | POA: Insufficient documentation

## 2020-01-30 DIAGNOSIS — R109 Unspecified abdominal pain: Secondary | ICD-10-CM | POA: Insufficient documentation

## 2020-01-30 DIAGNOSIS — Z7982 Long term (current) use of aspirin: Secondary | ICD-10-CM | POA: Diagnosis not present

## 2020-01-30 DIAGNOSIS — Y92009 Unspecified place in unspecified non-institutional (private) residence as the place of occurrence of the external cause: Secondary | ICD-10-CM

## 2020-01-30 DIAGNOSIS — S2232XA Fracture of one rib, left side, initial encounter for closed fracture: Secondary | ICD-10-CM | POA: Diagnosis not present

## 2020-01-30 DIAGNOSIS — S299XXA Unspecified injury of thorax, initial encounter: Secondary | ICD-10-CM | POA: Diagnosis present

## 2020-01-30 DIAGNOSIS — W010XXA Fall on same level from slipping, tripping and stumbling without subsequent striking against object, initial encounter: Secondary | ICD-10-CM | POA: Insufficient documentation

## 2020-01-30 DIAGNOSIS — Y929 Unspecified place or not applicable: Secondary | ICD-10-CM | POA: Insufficient documentation

## 2020-01-30 LAB — COMPREHENSIVE METABOLIC PANEL
ALT: 16 U/L (ref 0–44)
AST: 14 U/L — ABNORMAL LOW (ref 15–41)
Albumin: 4 g/dL (ref 3.5–5.0)
Alkaline Phosphatase: 82 U/L (ref 38–126)
Anion gap: 6 (ref 5–15)
BUN: 10 mg/dL (ref 8–23)
CO2: 29 mmol/L (ref 22–32)
Calcium: 8.9 mg/dL (ref 8.9–10.3)
Chloride: 103 mmol/L (ref 98–111)
Creatinine, Ser: 0.68 mg/dL (ref 0.61–1.24)
GFR calc Af Amer: >60 mL/min (ref >60)
GFR calc non Af Amer: >60 mL/min (ref >60)
Glucose, Bld: 161 mg/dL — ABNORMAL HIGH (ref 70–99)
Potassium: 4.6 mmol/L (ref 3.5–5.1)
Sodium: 138 mmol/L (ref 135–145)
Total Bilirubin: 0.8 mg/dL (ref 0.3–1.2)
Total Protein: 6.5 g/dL (ref 6.5–8.1)

## 2020-01-30 LAB — CBC WITH DIFFERENTIAL/PLATELET
Abs Immature Granulocytes: 0.32 10*3/uL — ABNORMAL HIGH (ref 0.00–0.07)
Basophils Absolute: 0.1 10*3/uL (ref 0.0–0.1)
Basophils Relative: 1 %
Eosinophils Absolute: 0.1 10*3/uL (ref 0.0–0.5)
Eosinophils Relative: 1 %
HCT: 43.5 % (ref 39.0–52.0)
Hemoglobin: 13.9 g/dL (ref 13.0–17.0)
Immature Granulocytes: 4 %
Lymphocytes Relative: 23 %
Lymphs Abs: 2.1 10*3/uL (ref 0.7–4.0)
MCH: 26.6 pg (ref 26.0–34.0)
MCHC: 32 g/dL (ref 30.0–36.0)
MCV: 83.3 fL (ref 80.0–100.0)
Monocytes Absolute: 0.8 10*3/uL (ref 0.1–1.0)
Monocytes Relative: 9 %
Neutro Abs: 5.6 10*3/uL (ref 1.7–7.7)
Neutrophils Relative %: 62 %
Platelets: 141 10*3/uL — ABNORMAL LOW (ref 150–400)
RBC: 5.22 MIL/uL (ref 4.22–5.81)
RDW: 14.6 % (ref 11.5–15.5)
WBC: 9 10*3/uL (ref 4.0–10.5)
nRBC: 0 % (ref 0.0–0.2)

## 2020-01-30 LAB — LIPASE, BLOOD: Lipase: 17 U/L (ref 11–51)

## 2020-01-30 MED ORDER — HYDROCODONE-ACETAMINOPHEN 5-325 MG PO TABS: 1.0000 | ORAL_TABLET | Freq: Four times a day (QID) | ORAL | 0 refills | Status: DC | PRN

## 2020-01-30 MED ORDER — IOHEXOL 300 MG/ML  SOLN
100.0000 mL | Freq: Once | INTRAMUSCULAR | Status: AC | PRN
  Administered 2020-01-30: 100 mL via INTRAVENOUS

## 2020-01-30 MED ORDER — HYDROMORPHONE HCL 1 MG/ML IJ SOLN
0.5000 mg | Freq: Once | INTRAMUSCULAR | Status: AC
  Administered 2020-01-30: 20:00:00 0.5 mg via INTRAVENOUS
  Filled 2020-01-30: qty 1

## 2020-01-30 MED ORDER — CYCLOBENZAPRINE HCL 5 MG PO TABS: 5.0000 mg | ORAL_TABLET | Freq: Two times a day (BID) | ORAL | 0 refills | Status: DC | PRN

## 2020-01-30 NOTE — ED Notes (Signed)
Attempted lab draw several times unsuccessfully, Lab phlebotomy will be attempting shortly.

## 2020-01-30 NOTE — Discharge Instructions (Signed)
Your work-up today did not show any evidence of serious injury within the abdomen or injury to your kidney.  Your kidney function today was normal.   Your work-up did show evidence of a rib fracture involving the left 11th rib.  This will heal up on its own in several weeks.  It is typically managed with Tylenol, gentle stretching, massage, heat therapy (hot showers and baths, application of heat or ice, whichever feels best 20 minutes at a time a few times daily).   You can take hydrocodone as needed for severe pain but do not drive, drink alcohol, or operate heavy machinery while taking this medicine as it can cause drowsiness.  Be aware this medicine also contains Tylenol and do not exceed more than 4000 mg of Tylenol daily.  You can also take Flexeril which is a muscle relaxer but this medicine can also have sedating properties.  I typically recommend only taking these medicines at night to help you get to sleep.  I would recommend taking them while you have someone at home to avoid increased risk of falls.  Use the incentive spirometer a few times daily to help reduce the risk of developing pneumonia.  Follow-up with your PCP for reevaluation of symptoms.  Return to the emergency department if any concerning signs or symptoms develop such as fevers, shortness of breath, cough, change in urination, persistent vomiting or severe abdominal pain.

## 2020-01-30 NOTE — Patient Outreach (Signed)
[facility]) Care Management  01/30/2020  Dejion Grillo 04-18-1951 [id number]   RED ON EMMI ALERT - General Discharge Day # 1 Date: 5/3 Red Alert Reason: Unsure who to call with changes in condition and has not read discharge instructions   Outreach attempt #1, successful however unable to address alert.  He report he is in the process of calling EMS due to an injury after falling.  State he was taking his prosthetic leg off and wasn't stable on his other leg, fell in the process and is now experiencing significant pain.     Plan: RN CM will follow up with member pending discharge from ED/hospital.  Kemper Durie, RN, MSN [facility] Care Management  [facility] Manager [phone]

## 2020-01-30 NOTE — ED Triage Notes (Signed)
EMS reports from home, Pt states he fell out of bed this morning while removing left prosthetic leg, (BKA). Denies any musculoskeletal  pain or LOC, no obvious injury. Pt c/o lower abdominal pain after fall and wishes to be checked out.  BP 134/62 HR 84 RR 18 Sp02 97 RA CBG 217 Temp 98.2

## 2020-01-30 NOTE — ED Provider Notes (Signed)
[facility]-EMERGENCY DEPT Provider Note   CSN: [id number] Arrival date & time: 01/30/20  1307     History Chief Complaint  Patient presents with   [name]    [name] is a 69 y.o. male with history of type I diabetes mellitus, anxiety, hypertension, peripheral vascular disease status post left BKA, renal failure status post renal transplant in 2008 presenting for evaluation of acute onset, persistent left side pain secondary to mechanical fall that occurred at around 4 5 AM.  He reports that he was leaning forward to take off his left prosthetic when he fell forward and landed on his left side.  He denies head injury or loss of consciousness.  He reports chronic neck pain which is at baseline for him.  He denies headaches, vision changes, numbness or weakness of the extremities.  He reports severe sharp left-sided abdominal and flank pains that radiate to the low back.  It worsens with any movement or palpation.  He feels short of breath due to the severity of the pain and has worsening pain when he takes deep breaths.  He denies chest pain.  He takes an aspirin daily but is not on any blood thinners.  He is not on any blood thinners.  The history is provided by the patient.       Past Medical History:  Diagnosis Date   Anxiety    CPAP (continuous positive airway pressure) dependence    Diabetes mellitus without complication (HCC)    GERD (gastroesophageal reflux disease)    Hypertension    Legally blind in left eye, as defined in [place]    PTSD (post-traumatic stress disorder)    PVD (peripheral vascular disease) (HCC) 01/17/2015   Renal disorder    Sleep apnea    Thyroid disease     Patient Active Problem List   Diagnosis Date Noted   DKA (diabetic ketoacidoses) (HCC) 01/25/2020   OSA on CPAP 09/13/2018   Intractable nausea and vomiting 09/12/2018   Kidney transplant recipient 09/12/2018   DKA, type 1 (HCC) 01/05/2018   Nausea and  vomiting 10/19/2017   Nausea & vomiting 10/18/2017   Hematemesis 07/24/2017   Hypertensive urgency 07/24/2017   Acute GI bleeding 07/24/2017   Status post below knee amputation of left lower extremity (HCC) 02/22/2015   S/P BKA (below knee amputation) unilateral (HCC) 02/20/2015   Ischemic ulcer of toe (HCC)    Peripheral vascular disease (HCC)    PVD (peripheral vascular disease) (HCC) 01/17/2015   Cellulitis and abscess of foot 01/13/2015   Cellulitis and abscess of foot excluding toe 01/13/2015   Diabetes mellitus without complication (HCC) 01/13/2015   Leukocytosis 01/13/2015   Diabetes mellitus with peripheral vascular disease (HCC) 12/19/2007   DYSLIPIDEMIA 12/19/2007   ANEMIA 12/19/2007   ANXIETY 12/19/2007   Essential hypertension 12/19/2007   TRANSIENT ISCHEMIC ATTACK 12/19/2007   PERIPHERAL VASCULAR DISEASE 12/19/2007   RENAL FAILURE 12/19/2007    Past Surgical History:  Procedure Laterality Date   ABDOMINAL AORTAGRAM N/A 01/21/2015   Procedure: ABDOMINAL Ronny Flurry;  Surgeon: Chuck Hint, MD;  Location: [facility] CATH LAB;  Service: Cardiovascular;  Laterality: N/A;   AMPUTATION Left 01/23/2015   Procedure: FIRST RAY AMPUTATION/LEFT FOOT;  Surgeon: Nadara Mustard, MD;  Location: [facility] OR;  Service: Orthopedics;  Laterality: Left;   AMPUTATION Left 02/20/2015   Procedure: AMPUTATION BELOW KNEE;  Surgeon: Nadara Mustard, MD;  Location: [facility] OR;  Service: Orthopedics;  Laterality: Left;   AV FISTULA  PLACEMENT     COLONOSCOPY     COMBINED KIDNEY-PANCREAS TRANSPLANT Left 2009   INCISION AND DRAINAGE ABSCESS Left 01/13/2015   Procedure: INCISION AND DRAINAGE OF LEFT FOOT FIRST RAY ABCESS;  Surgeon: Kathryne Hitch, MD;  Location: [facility] OR;  Service: Orthopedics;  Laterality: Left;   LOWER EXTREMITY ANGIOGRAM Left 01/21/2015   Procedure: LOWER EXTREMITY ANGIOGRAM;  Surgeon: Chuck Hint, MD;  Location: [facility] CATH LAB;  Service: Cardiovascular;   Laterality: Left;       Family History  Problem Relation Age of Onset   Hypertension Other     Social History   Tobacco Use   Smoking status: Former Smoker   Smokeless tobacco: Never Used   Tobacco comment: quit i [phone]  Substance Use Topics   Alcohol use: Yes    Comment: 1 beer rarely    Drug use: No    Home Medications Prior to Admission medications   Medication Sig Start Date End Date Taking? Authorizing Provider  amLODipine (NORVASC) 10 MG tablet Take 10 mg by mouth daily.   Yes [provider]  aspirin EC 81 MG tablet Take 81 mg by mouth daily.   Yes [provider]  atorvastatin (LIPITOR) 80 MG tablet Take 40 mg by mouth at bedtime.    Yes [provider]  insulin glargine (LANTUS) 100 UNIT/ML injection Inject 20 Units into the skin daily.   Yes [provider]  mycophenolate (CELLCEPT) 250 MG capsule Take 1,000 mg by mouth 2 (two) times daily.   Yes [provider]  pantoprazole (PROTONIX) 40 MG tablet Take 1 tablet (40 mg total) by mouth daily. 07/28/17  Yes Osvaldo Shipper, MD  senna (SENOKOT) 8.6 MG TABS tablet Take 1 tablet by mouth 2 (two) times daily.   Yes [provider]  tacrolimus (PROGRAF) 5 MG capsule Take 5 mg by mouth 2 (two) times daily.   Yes [provider]  traZODone (DESYREL) 50 MG tablet Take 50 mg by mouth at bedtime as needed for sleep.   Yes [provider]  cyclobenzaprine (FLEXERIL) 5 MG tablet Take 1 tablet (5 mg total) by mouth 2 (two) times daily as needed for muscle spasms. 01/30/20   Tiandra Swoveland A, PA-C  furosemide (LASIX) 20 MG tablet Take 1 tablet (20 mg total) by mouth daily. Patient not taking: Reported on 01/30/2020 07/28/17   Osvaldo Shipper, MD  HYDROcodone-acetaminophen (NORCO/VICODIN) 5-325 MG tablet Take 1 tablet by mouth every 6 (six) hours as needed for severe pain. 01/30/20   Ezekial Arns A, PA-C  labetalol (NORMODYNE) 100 MG tablet Take 2 tablets (200 mg  total) by mouth 2 (two) times daily. Patient not taking: Reported on 10/30/2019 07/28/17   Osvaldo Shipper, MD  lisinopril (PRINIVIL,ZESTRIL) 10 MG tablet Take 1 tablet (10 mg total) by mouth daily. Patient not taking: Reported on 10/30/2019 01/07/18   Burnadette Pop, MD  metoCLOPramide (REGLAN) 5 MG tablet Take 1 tablet (5 mg total) by mouth 3 (three) times daily for 3 days. Patient not taking: Reported on 04/24/2019 10/20/17 01/05/26  Leatha Gilding, MD  ondansetron (ZOFRAN) 4 MG tablet Take 1 tablet (4 mg total) by mouth every 8 (eight) hours as needed for nausea or vomiting. Patient not taking: Reported on 10/30/2019 04/24/19   Devoria Albe, MD  potassium chloride 20 MEQ TBCR Take 20 mEq by mouth daily. Patient not taking: Reported on 10/30/2019 01/07/18   Burnadette Pop, MD  silver sulfADIAZINE (SILVADENE) 1 % cream Apply 1  application topically daily. Patient not taking: Reported on 10/30/2019 06/12/19   Eustace Moore, MD    Allergies    Patient has no known allergies.  Review of Systems   Review of Systems  Constitutional: Negative for fever.  Respiratory: Positive for shortness of breath.   Gastrointestinal: Positive for abdominal pain. Negative for nausea and vomiting.  Musculoskeletal: Positive for neck pain (Chronic, unchanged).  Neurological: Negative for syncope and headaches.  All other systems reviewed and are negative.   Physical Exam Updated Vital Signs BP (!) 178/71    Pulse 97    Temp 97.9 F (36.6 C) (Oral)    Resp 16    SpO2 100%   Physical Exam Vitals and nursing note reviewed.  Constitutional:      General: He is not in acute distress.    Appearance: He is well-developed.  HENT:     Head: Normocephalic and atraumatic.     Comments: No Battle's signs, no raccoon's eyes, no rhinorrhea. No hemotympanum. No tenderness to palpation of the face or skull. No deformity, crepitus, or swelling noted.  Eyes:     General:        Right eye: No discharge.        Left eye:  No discharge.     Conjunctiva/sclera: Conjunctivae normal.  Neck:     Vascular: No JVD.     Trachea: No tracheal deviation.     Comments: Diffuse midline cervical and paracervical muscle tenderness.  Patient reports this is chronic and unchanged today.  No deformity, crepitus, or step-off. Cardiovascular:     Rate and Rhythm: Normal rate and regular rhythm.     Comments: AV fistula to the right upper extremity with palpable thrill.  2+ radial and right DP/PT pulses. Pulmonary:     Effort: Pulmonary effort is normal.     Breath sounds: Normal breath sounds.     Comments: Mild left lower chest wall tenderness with no deformity, crepitus, or step-off. Chest:     Chest wall: Tenderness present.  Abdominal:     General: Abdomen is protuberant. A surgical scar is present. Bowel sounds are normal. There is no distension.     Palpations: Abdomen is soft.     Tenderness: There is abdominal tenderness in the left upper quadrant and left lower quadrant. There is no right CVA tenderness or left CVA tenderness.  Musculoskeletal:     Cervical back: Neck supple. Tenderness present.     Comments: No midline thoracic or lumbar spine tenderness.  Mild left paralumbar muscle tenderness.  No deformity, crepitus, or step-off.  Left BKA.  5/5 strength of BUE and BLE major muscle groups.  Pelvis is stable.  No pain with palpation of the hips.  Skin:    General: Skin is warm and dry.     Findings: No erythema.  Neurological:     Mental Status: He is alert.  Psychiatric:        Behavior: Behavior normal.     ED Results / Procedures / Treatments   Labs (all labs ordered are listed, but only abnormal results are displayed) Labs Reviewed  CBC WITH DIFFERENTIAL/PLATELET - Abnormal; Notable for the following components:      Result Value   Platelets 141 (*)    Abs Immature Granulocytes 0.32 (*)    All other components within normal limits  COMPREHENSIVE METABOLIC PANEL - Abnormal; Notable for the  following components:   Glucose, Bld 161 (*)    AST 14 (*)  All other components within normal limits  LIPASE, BLOOD    EKG None  Radiology DG Ribs Unilateral W/Chest Left  Result Date: 01/30/2020 CLINICAL DATA:  Fall.  Chest pain. EXAM: LEFT RIBS AND CHEST - 3+ VIEW COMPARISON:  07/27/2018. FINDINGS: Mediastinum hilar structures normal. Lungs are clear. No pleural effusion or pneumothorax. Heart size normal. Slightly displaced fracture of the posterior left eleventh rib noted. IMPRESSION: Slightly displaced fracture of the posterior left eleventh rib noted. No pneumothorax. Electronically Signed   By: Maisie Fushomas  Register   On: 01/30/2020 15:03   CT ABDOMEN PELVIS W CONTRAST  Result Date: 01/30/2020 CLINICAL DATA:  Pain status post fall EXAM: CT ABDOMEN AND PELVIS WITH CONTRAST TECHNIQUE: Multidetector CT imaging of the abdomen and pelvis was performed using the standard protocol following bolus administration of intravenous contrast. CONTRAST:  100mL OMNIPAQUE IOHEXOL 300 MG/ML  SOLN COMPARISON:  10/30/2019 FINDINGS: Lower chest: There is chronic atelectasis versus scarring at the right lung base.The heart is enlarged. Hepatobiliary: The liver is normal. Normal gallbladder.There is no biliary ductal dilation. Pancreas: Normal contours without ductal dilatation. No peripancreatic fluid collection. Spleen: Unremarkable. Adrenals/Urinary Tract: --Adrenal glands: Unremarkable. --Right kidney/ureter: The right kidney is atrophic. --Left kidney/ureter: The left kidney is atrophic. There is a left pelvic transplant kidney in place. There is no frank hydronephrosis. There is no perinephric hematoma. --Urinary bladder: Unremarkable. Stomach/Bowel: --Stomach/Duodenum: No hiatal hernia or other gastric abnormality. Normal duodenal course and caliber. --Small bowel: Unremarkable. --Colon: Unremarkable. --Appendix: Not visualized. No right lower quadrant inflammation or free fluid. Vascular/Lymphatic:  Atherosclerotic calcification is present within the non-aneurysmal abdominal aorta, without hemodynamically significant stenosis. --No retroperitoneal lymphadenopathy. --No mesenteric lymphadenopathy. --No pelvic or inguinal lymphadenopathy. Reproductive: Unremarkable Other: No ascites or free air. The abdominal wall is normal. Musculoskeletal. No acute displaced fractures. IMPRESSION: 1. No acute abdominopelvic abnormality. 2. Left pelvic transplant kidney without frank hydronephrosis. 3. Cardiomegaly. Aortic Atherosclerosis (ICD10-I70.0). Electronically Signed   By: Katherine Mantlehristopher  Green M.D.   On: 01/30/2020 18:35    Procedures Procedures (including critical care time)  Medications Ordered in ED Medications  iohexol (OMNIPAQUE) 300 MG/ML solution 100 mL (100 mLs Intravenous Contrast Given 01/30/20 1814)  HYDROmorphone (DILAUDID) injection 0.5 mg (0.5 mg Intravenous Given 01/30/20 1952)    ED Course  I have reviewed the triage vital signs and the nursing notes.  Pertinent labs & imaging results that were available during my care of the patient were reviewed by me and considered in my medical decision making (see chart for details).    MDM Rules/Calculators/A&P                      Patient presenting for evaluation of left side pain secondary to mechanical fall at home.  No prodrome leading up to the fall.  He is afebrile, intermittently hypertensive in the ED.  He is nontoxic in appearance.  Is neurovascularly intact.  No signs of serious head injury.  No midline spine tenderness.  He has focal left chest wall and abdominal tenderness on examination.  Will obtain plain films of the chest and CT scan of the abdomen and pelvis for further evaluation and to rule out splenic injury or other serious intra-abdominal or intrathoracic injury.  Lab work reviewed and interpreted by myself shows no leukocytosis, no anemia, no metabolic derangements, no renal insufficiency.  Rib films show a slightly displaced  fracture of the posterior left 11th rib with no pneumothorax or other cardiopulmonary abnormality.  Imaging of the abdomen  shows no acute abdominopelvic abnormality.  His left pelvic transplant kidney is present without frank hydronephrosis.  On reevaluation the patient is resting comfortably in no apparent distress.  Pain managed in the ED.  Will discharge with incentive spirometer to help avoid the development of pneumonia.  Discussed management with Tylenol, avoiding NSAIDs unless under the supervision of his PCP, hydrocodone and Flexeril as needed for severe breakthrough pain and muscle relaxation.  We discussed appropriate use of these medications and potential sedating side effects.  Recommend follow-up with PCP for reevaluation of symptoms. Discussed strict ED return precautions. Patient verbalized understanding of and agreement with plan and is safe for discharge home at this time.   Final Clinical Impression(s) / ED Diagnoses Final diagnoses:  Fall in home, initial encounter  Closed fracture of one rib of left side, initial encounter  Left flank pain    Rx / DC Orders ED Discharge Orders         Ordered    cyclobenzaprine (FLEXERIL) 5 MG tablet  2 times daily PRN     01/30/20 1949    HYDROcodone-acetaminophen (NORCO/VICODIN) 5-325 MG tablet  Every 6 hours PRN     01/30/20 1949           Renita Papa, PA-C 01/30/20 1955    Lucrezia Starch, MD 02/01/20 [phone]

## 2020-02-02 ENCOUNTER — Other Ambulatory Visit: Payer: Self-pay | Admitting: *Deleted

## 2020-02-02 ENCOUNTER — Encounter: Payer: Self-pay | Admitting: *Deleted

## 2020-02-02 NOTE — Patient Outreach (Signed)
[facility]) Care Management  02/02/2020  [name]. [birth year] [id number]    RED ON EMMI ALERT - General Discharge Day # 1 Date: 5/3 Red Alert Reason: Unsure who to call with changes in condition and has not read discharge instructions  Outreach attempt #2, successful, identity verified.  This care manager introduced self and stated purpose of call.  [facility] care management services explained.  Social: Lives with wife and mother in law, independent for most ADL's, wife help with some.  Report they moved in with mother in law due to their rental home being condemned.  State they will eventually move, but will remain in the home right now to help care for her.  Conditions: Per chart, has history of PVD, DM, HTN, TIA, OSA, dyslipidemia, and left BKA.  He recently had a fall on 5/4 and suffered a fractured rib.  Report having some pain but state it is relieved with medications.  State blood sugars have remained in the 200s, last A1C was 10.  Report he had recently been off his diabetic diet, especially with sodas, now working on getting back on track.  Medications: Reviewed with member, denies questions or need for financial assistance.    Appointments:  Denies having appointment with PCP, informed that no PCP was listed on chart.  State he does have one assigned but unsure of the name.  Report his insurance card is in the car and his wife is not home.  State he will get number and call office for appointment.  Report being seen by nephrology at [facility], has Neurologist and Endocrinologist through the [place] system, seen in [place].  Will follow up with wife once she return regarding follow up visits.  Denies any urgent concerns, encouraged to contact this care manager with questions.   Plan: RN CM will follow up within the next 2 weeks.  Fall Risk  02/02/2020  Falls in the past year? 1  Number falls in past yr: 0  Injury with Fall? 1  Risk for fall due to : History  of fall(s)   Depression screen PHQ 2/9 02/02/2020  Decreased Interest 0  Down, Depressed, Hopeless 1  PHQ - 2 Score 1   THN CM Care Plan Problem One     Most Recent Value  Care Plan Problem One  Risk for readmission as evidenced by recent hospitalization and ED visit  Role Documenting the Problem One  Care Management Coordinator  Care Plan for Problem One  Active  THN Long Term Goal   Member will not be readmitted to hospital within the next 31 days  THN Long Term Goal Start Date  02/02/20  Interventions for Problem One Long Term Goal  AVS reviewed with member.  Discussed plan of care, including diet and fall prevention measures  THN CM Short Term Goal #1   Member will report having follow up appointment with PCP within the next 2 weeks  THN CM Short Term Goal #1 Start Date  02/02/20  Interventions for Short Term Goal #1  Member educated on need for follow up with PCP to decrease risk of admission.  Advised to contact office as soon as possible for appointment  [facility] CM Short Term Goal #2   Member will report blood sugars staying less than 200 consistently over the next 4 weeks  THN CM Short Term Goal #2 Start Date  02/02/20  Interventions for Short Term Goal #2  Reviewed proper diabetic diet with member.  Educated  on need for consistent monitoring and recording of readings in order to provided MD with trends for accurate management     Valente David, RN, MSN [place] Manager [phone]

## 2020-02-16 ENCOUNTER — Other Ambulatory Visit: Payer: Self-pay | Admitting: *Deleted

## 2020-02-16 NOTE — Patient Outreach (Signed)
[facility]) Care Management  02/16/2020  [name]. 06/15/1951 [id number]   Call placed to member to follow up on management of diabetes and to confirm he has appointment with new PCP.  He report he does not want to follow up with the endocrinologist at the [facility] system any longer and would like to start seeing Dr. Lucianne Muss again.  He will call office to schedule visit.  Report blood sugars are ranging 150's-180's, denies any readings over the last 2 weeks above 200.  He was able to get his insurance card from his wife's care, state his listed PCP is Dr. Concepcion Elk.  He has been trying to contact their office but was unsuccessful.  He expresses frustration stating this has not been a good first impression and he would like to change, prefer to have a male physician.    Conference call placed to [facility] to help secure new PCP, now assigned to Dr. Dorinda Hill with [facility].  Conference call was also placed to office to schedule new appointment, visit on 6/3 (he have to wait for new insurance card to come prior to visit).  Per [facility] representative, member was also educated on vendor and benefits for OTC products, she will send information in the mail.    Member denies any urgent concerns at this time, encouraged to contact this care manager with questions.  Will assign EMMI education regarding diabetes management.  Will follow up within the next 3 weeks, after PCP visit.  THN CM Care Plan Problem One     Most Recent Value  Care Plan Problem One  Risk for readmission as evidenced by recent hospitalization and ED visit  Role Documenting the Problem One  Care Management Coordinator  Care Plan for Problem One  Active  THN Long Term Goal   Member will not be readmitted to hospital within the next 31 days  THN Long Term Goal Start Date  02/02/20  Interventions for Problem One Long Term Goal  EMMI education sent regarding management of chronic medical condition (diabetes) and  fractured rib  THN CM Short Term Goal #1   Member will report having follow up appointment with PCP within the next 2 weeks  THN CM Short Term Goal #1 Start Date  02/16/20 [Date reset]  Interventions for Short Term Goal #1  Call placed to [facility] to change PCP and schedule appointment  [facility] CM Short Term Goal #2   Member will report blood sugars staying less than 200 consistently over the next 4 weeks  THN CM Short Term Goal #2 Start Date  02/02/20  Interventions for Short Term Goal #2  Encouraged to call to schedule follow up with endocrinologist.  Educated on importance of close follow up with specialsit     Kemper Durie, RN, MSN [facility] Care Management  [facility] Manager [phone]

## 2020-03-04 ENCOUNTER — Other Ambulatory Visit: Payer: Self-pay | Admitting: *Deleted

## 2020-03-04 NOTE — Patient Outreach (Signed)
[name]) Care Management  03/04/2020  Racer Quam. 04-14-1951 [id number]   Call placed to member to follow up on appointment with new PCP.  State this is not a good time to talk, request call back once he is available.  Will await call back, if no call back will follow up within the next week.   Update:  Call placed back to member, report he is doing well.  Confirmed he was seen by new PCP, Dr. Jacalyn Lefevre at [facility] on 6/3.  State he will continue to see her and she is requesting records from other providers.  Follow up scheduled for September.  He still has not scheduled appointment with Dr. Dwyane Dee, will call today to schedule.  State he will stay active with new provider as well as have visits with the [place], aware that if he is not seen at the [place] he will not be able to get his medications covered through the system.    Report he feels as if his fracture is healing, pain almost gone.  State blood sugars have ranged 160-180's, has not had any hypo/hyperglycemic episodes.  Denies any urgent concerns, encouraged to contact this care manager with questions.  THN CM Care Plan Problem One     Most Recent Value  Care Plan Problem One  Risk for readmission as evidenced by recent hospitalization and ED visit  Role Documenting the Problem One  Care Management [place] for Problem One  Active  THN Long Term Goal   Member will not be readmitted to hospital within the next 31 days  THN Long Term Goal Start Date  02/02/20  [facility] Long Term Goal Met Date  03/04/20  [facility] CM Short Term Goal #1   Member will report having follow up appointment with PCP within the next 2 weeks  THN CM Short Term Goal #1 Start Date  02/16/20 [Date reset]  THN CM Short Term Goal #1 Met Date  03/04/20  THN CM Short Term Goal #2   Member will report blood sugars staying less than 200 consistently over the next 4 weeks  THN CM Short Term Goal #2 Start Date  02/02/20  [facility] CM Short  Term Goal #2 Met Date  03/04/20  [facility] CM Short Term Goal #3  Member will have appointment with endocrinologist within the next 2 weeks  THN CM Short Term Goal #3 Start Date  03/04/20  Interventions for Short [place]ern Goal #3  Discussed importance of seeing speciailst in effort to manage diabetes.  Advised to contact as soon as possible for visit       Valente David, Therapist, sports, MSN [place] Manager [phone]

## 2020-03-05 ENCOUNTER — Ambulatory Visit: Payer: Self-pay | Admitting: *Deleted

## 2020-04-04 ENCOUNTER — Other Ambulatory Visit: Payer: Self-pay | Admitting: *Deleted

## 2020-04-04 NOTE — Patient Outreach (Signed)
[facility]) Care Management  04/04/2020  Al Bracewell. 01/02/1951 [id number]   Call placed to member to follow up on management of diabetes, no answer.  HIPAA compliant voice message left, will follow up within the next 3-4 business days.  Kemper Durie, [place], MSN [facility] Care Management  [facility] Manager [phone]

## 2020-04-10 ENCOUNTER — Other Ambulatory Visit: Payer: Self-pay | Admitting: *Deleted

## 2020-04-10 NOTE — Patient Outreach (Signed)
[facility]) Care Management  04/10/2020  [name]. 02-Nov-1950 [id number]   Outreach attempt #2, successful.  Call placed to member to follow up on management of diabetes.  State his blood sugars have been "going in the right direction."  This week's readings have been 132 on Monday and 140 Tuesday.  He has not checked blood sugar today yet.  Denies any hypo/hyperglycemic episodes.  He admits that he has not scheduled appointment with endocrinologist.  Offered to schedule for member, he declines and state he will call to do it himself.  Denies any urgent concerns at this time, encouraged to contact this care manager with questions.  Goals Addressed              This Visit's Progress   .  I want to have my diabetes controlled (pt-stated)        CARE PLAN ENTRY (see longtitudinal plan of care for additional care plan information)  Objective:  Lab Results  Component Value Date   HGBA1C 10.0 (H) 01/25/2020 .   Lab Results  Component Value Date   CREATININE 0.68 01/30/2020   CREATININE 0.78 01/27/2020   CREATININE 1.00 01/26/2020 .   Marland Kitchen No results found for: EGFR  Current Barriers:  Marland Kitchen Knowledge Deficits related to basic Diabetes pathophysiology and self care/management . Knowledge Deficits related to medications used for management of diabetes  Case Manager Clinical Goal(s):  Over the next 45 days, patient will demonstrate improved adherence to prescribed treatment plan for diabetes self care/management as evidenced by: Decreased A1C . Verbalize daily monitoring and recording of CBG within 28 days . Verbalize adherence to ADA/ carb modified diet within the next 28 days  Interventions:  . Provided education to patient about basic DM disease process . Reviewed medications with patient and discussed importance of medication adherence . Discussed plans with patient for ongoing care management follow up and provided patient with direct contact information  for care management team . Reviewed scheduled/upcoming provider appointments including: need for endocrinology appointment  Patient Self Care Activities:  . Self administers insulin as prescribed . Attends all scheduled provider appointments . Checks blood sugars as prescribed and utilize hyper and hypoglycemia protocol as needed  Initial goal documentation       Valente David, RN, MSN [place] Manager [phone]

## 2020-05-05 ENCOUNTER — Other Ambulatory Visit: Payer: Self-pay

## 2020-05-05 ENCOUNTER — Emergency Department (HOSPITAL_COMMUNITY)
Admission: EM | Admit: 2020-05-05 | Discharge: 2020-05-06 | Disposition: A | Discharge status: Home / Self Care | Payer: Medicare Other | Attending: Emergency Medicine | Admitting: Emergency Medicine

## 2020-05-05 ENCOUNTER — Emergency Department (HOSPITAL_COMMUNITY): Payer: Medicare Other

## 2020-05-05 ENCOUNTER — Encounter (HOSPITAL_COMMUNITY): Payer: Self-pay | Admitting: Emergency Medicine

## 2020-05-05 DIAGNOSIS — I96 Gangrene, not elsewhere classified: Secondary | ICD-10-CM | POA: Insufficient documentation

## 2020-05-05 DIAGNOSIS — E119 Type 2 diabetes mellitus without complications: Secondary | ICD-10-CM | POA: Insufficient documentation

## 2020-05-05 DIAGNOSIS — Z5321 Procedure and treatment not carried out due to patient leaving prior to being seen by health care provider: Secondary | ICD-10-CM | POA: Insufficient documentation

## 2020-05-05 LAB — COMPREHENSIVE METABOLIC PANEL
ALT: 14 U/L (ref 0–44)
AST: 11 U/L — ABNORMAL LOW (ref 15–41)
Albumin: 4.4 g/dL (ref 3.5–5.0)
Alkaline Phosphatase: 95 U/L (ref 38–126)
Anion gap: 9 (ref 5–15)
BUN: 14 mg/dL (ref 8–23)
CO2: 28 mmol/L (ref 22–32)
Calcium: 9.8 mg/dL (ref 8.9–10.3)
Chloride: 103 mmol/L (ref 98–111)
Creatinine, Ser: 1.16 mg/dL (ref 0.61–1.24)
GFR calc Af Amer: >60 mL/min (ref >60)
GFR calc non Af Amer: >60 mL/min (ref >60)
Glucose, Bld: 199 mg/dL — ABNORMAL HIGH (ref 70–99)
Potassium: 4.6 mmol/L (ref 3.5–5.1)
Sodium: 140 mmol/L (ref 135–145)
Total Bilirubin: 1.1 mg/dL (ref 0.3–1.2)
Total Protein: 7 g/dL (ref 6.5–8.1)

## 2020-05-05 LAB — PROTIME-INR
INR: 1 (ref 0.8–1.2)
Prothrombin Time: 12.4 seconds (ref 11.4–15.2)

## 2020-05-05 LAB — CBC WITH DIFFERENTIAL/PLATELET
Abs Immature Granulocytes: 0.15 10*3/uL — ABNORMAL HIGH (ref 0.00–0.07)
Basophils Absolute: 0 10*3/uL (ref 0.0–0.1)
Basophils Relative: 1 %
Eosinophils Absolute: 0.2 10*3/uL (ref 0.0–0.5)
Eosinophils Relative: 3 %
HCT: 46.6 % (ref 39.0–52.0)
Hemoglobin: 14.4 g/dL (ref 13.0–17.0)
Immature Granulocytes: 2 %
Lymphocytes Relative: 33 %
Lymphs Abs: 2.4 10*3/uL (ref 0.7–4.0)
MCH: 26 pg (ref 26.0–34.0)
MCHC: 30.9 g/dL (ref 30.0–36.0)
MCV: 84.1 fL (ref 80.0–100.0)
Monocytes Absolute: 0.6 10*3/uL (ref 0.1–1.0)
Monocytes Relative: 8 %
Neutro Abs: 4 10*3/uL (ref 1.7–7.7)
Neutrophils Relative %: 53 %
Platelets: 150 10*3/uL (ref 150–400)
RBC: 5.54 MIL/uL (ref 4.22–5.81)
RDW: 15 % (ref 11.5–15.5)
WBC: 7.3 10*3/uL (ref 4.0–10.5)
nRBC: 0 % (ref 0.0–0.2)

## 2020-05-05 NOTE — ED Triage Notes (Signed)
Patient reports his right toes are turning black onset this week , denies injury , history of DM and leg amputation .

## 2020-05-06 ENCOUNTER — Emergency Department (HOSPITAL_COMMUNITY)
Admission: EM | Admit: 2020-05-06 | Discharge: 2020-05-06 | Disposition: A | Discharge status: Home / Self Care | Payer: Medicare Other | Source: Home / Self Care | Attending: Emergency Medicine | Admitting: Emergency Medicine

## 2020-05-06 ENCOUNTER — Encounter (HOSPITAL_COMMUNITY): Payer: Self-pay | Admitting: Emergency Medicine

## 2020-05-06 ENCOUNTER — Other Ambulatory Visit: Payer: Self-pay

## 2020-05-06 DIAGNOSIS — M7989 Other specified soft tissue disorders: Secondary | ICD-10-CM | POA: Insufficient documentation

## 2020-05-06 DIAGNOSIS — Z5321 Procedure and treatment not carried out due to patient leaving prior to being seen by health care provider: Secondary | ICD-10-CM | POA: Insufficient documentation

## 2020-05-06 NOTE — ED Triage Notes (Signed)
Pt states his right toes are turning black for about a week and he will like to have it check hx of DM with amputation. Pt denies any pain no wound noticed on triage.

## 2020-05-06 NOTE — ED Notes (Signed)
Called for room x1 with no response and not visible in lobby

## 2020-05-09 ENCOUNTER — Other Ambulatory Visit: Payer: Self-pay | Admitting: *Deleted

## 2020-05-09 NOTE — Patient Outreach (Signed)
[facility]) Care Management  05/09/2020  Ayomikun Starling. 1951-09-14 [id number]   Call placed to member to follow up on diabetes management.  State this is not a good time to talk, will call this care manager back later today.  Will await call back, if no call back will follow up within the next 3-4 business days.  Kemper Durie, [place], MSN [facility] Care Management  [facility] Manager [phone]

## 2020-05-14 ENCOUNTER — Other Ambulatory Visit: Payer: Self-pay | Admitting: *Deleted

## 2020-05-14 NOTE — Patient Outreach (Signed)
[facility]) Care Management  05/14/2020  [name]. [birth year] [id number]   Outreach attempt #2, unsuccessful.  Call placed to member to follow up on diabetes management.  No answer, HIPAA compliant voice message left.  Will send outreach letter and follow up within the next 3-4 business days.  Kemper Durie, [place], MSN [facility] Care Management  [facility] Manager [phone]

## 2020-05-17 ENCOUNTER — Other Ambulatory Visit: Payer: Self-pay | Admitting: *Deleted

## 2020-05-17 NOTE — Patient Outreach (Signed)
[facility]) Care Management  05/17/2020  Deforest Maiden. 08-21-1951 [id number]   Outreach attempt #3, unsuccessful.  Call placed to member to follow up on diabetes management.  No answer, unable to leave message as recording state member is not accepting calls at this time.  Will follow up with 4th and final attempt within the next 4 weeks.  If remain unsuccessful, will close case due to inability to maintain contact.  Kemper Durie, [place], MSN [facility] Care Management  [facility] Manager [phone]

## 2020-06-12 ENCOUNTER — Other Ambulatory Visit: Payer: Self-pay | Admitting: *Deleted

## 2020-06-12 NOTE — Patient Outreach (Signed)
[facility]) Care Management  06/12/2020  [name]. [birth year] [id number]   Outreach attempt #4, unsuccessful.  HIPAA compliant voice message left.  No response from member after multiple unsuccessful outreach attempts and letter sent.  Will close case at this time due to inability to maintain contact.  Will notify member and primary MD of case closure.  Kemper Durie, [place], MSN [facility] Care Management  [facility] Manager [phone]

## 2020-06-17 ENCOUNTER — Other Ambulatory Visit: Payer: Self-pay

## 2020-06-17 ENCOUNTER — Other Ambulatory Visit (HOSPITAL_COMMUNITY): Payer: Self-pay | Admitting: Podiatry

## 2020-06-17 ENCOUNTER — Ambulatory Visit: Payer: Medicare Other | Admitting: Podiatry

## 2020-06-17 DIAGNOSIS — I739 Peripheral vascular disease, unspecified: Secondary | ICD-10-CM

## 2020-06-17 DIAGNOSIS — L97511 Non-pressure chronic ulcer of other part of right foot limited to breakdown of skin: Secondary | ICD-10-CM | POA: Diagnosis not present

## 2020-06-17 DIAGNOSIS — E1040 Type 1 diabetes mellitus with diabetic neuropathy, unspecified: Secondary | ICD-10-CM

## 2020-06-17 DIAGNOSIS — Z89512 Acquired absence of left leg below knee: Secondary | ICD-10-CM | POA: Diagnosis not present

## 2020-06-18 ENCOUNTER — Inpatient Hospital Stay (HOSPITAL_COMMUNITY): Admission: RE | Admit: 2020-06-18 | Payer: Medicare Other | Source: Ambulatory Visit

## 2020-06-21 NOTE — Progress Notes (Signed)
Subjective:   Patient ID: [name]., male   DOB: 69 y.o.   MRN: [id number]   HPI 69 year old male presents the office today for concerns of possible wound, callus to the tips of his right foot and toes.  He states that he wore a new pair shoes and there is the tissue paper the tips of the toes that he did not remove and later saw that he developed wounds on the toes but they appear to be almost completely healed at this time.  He denies any drainage or pus or any swelling.  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  His last A1c was 10.   Review of Systems  All other systems reviewed and are negative.  Past Medical History:  Diagnosis Date  . Anxiety   . CPAP (continuous positive airway pressure) dependence   . Diabetes mellitus without complication (HCC)   . GERD (gastroesophageal reflux disease)   . Hypertension   . Legally blind in left eye, as defined in [place]   . PTSD (post-traumatic stress disorder)   . PVD (peripheral vascular disease) (HCC) 01/17/2015  . Renal disorder   . Sleep apnea   . Thyroid disease     Past Surgical History:  Procedure Laterality Date  . ABDOMINAL AORTAGRAM N/A 01/21/2015   Procedure: ABDOMINAL Ronny Flurry;  Surgeon: Chuck Hint, MD;  Location: [facility] CATH LAB;  Service: Cardiovascular;  Laterality: N/A;  . AMPUTATION Left 01/23/2015   Procedure: FIRST RAY AMPUTATION/LEFT FOOT;  Surgeon: Nadara Mustard, MD;  Location: [facility] OR;  Service: Orthopedics;  Laterality: Left;  . AMPUTATION Left 02/20/2015   Procedure: AMPUTATION BELOW KNEE;  Surgeon: Nadara Mustard, MD;  Location: [facility] OR;  Service: Orthopedics;  Laterality: Left;  . AV FISTULA PLACEMENT    . COLONOSCOPY    . COMBINED KIDNEY-PANCREAS TRANSPLANT Left 2009  . INCISION AND DRAINAGE ABSCESS Left 01/13/2015   Procedure: INCISION AND DRAINAGE OF LEFT FOOT FIRST RAY ABCESS;  Surgeon: Kathryne Hitch, MD;  Location: [facility] OR;  Service: Orthopedics;  Laterality: Left;  . LOWER EXTREMITY  ANGIOGRAM Left 01/21/2015   Procedure: LOWER EXTREMITY ANGIOGRAM;  Surgeon: Chuck Hint, MD;  Location: [facility] CATH LAB;  Service: Cardiovascular;  Laterality: Left;     Current Outpatient Medications:  .  amLODipine (NORVASC) 10 MG tablet, Take 10 mg by mouth daily., Disp: , Rfl:  .  aspirin EC 81 MG tablet, Take 81 mg by mouth daily., Disp: , Rfl:  .  atorvastatin (LIPITOR) 80 MG tablet, Take 40 mg by mouth at bedtime. , Disp: , Rfl:  .  cyclobenzaprine (FLEXERIL) 5 MG tablet, Take 1 tablet (5 mg total) by mouth 2 (two) times daily as needed for muscle spasms., Disp: 10 tablet, Rfl: 0 .  furosemide (LASIX) 20 MG tablet, Take 1 tablet (20 mg total) by mouth daily. (Patient not taking: Reported on 01/30/2020), Disp: 30 tablet, Rfl: 2 .  HYDROcodone-acetaminophen (NORCO/VICODIN) 5-325 MG tablet, Take 1 tablet by mouth every 6 (six) hours as needed for severe pain., Disp: 10 tablet, Rfl: 0 .  insulin glargine (LANTUS) 100 UNIT/ML injection, Inject 20 Units into the skin daily., Disp: , Rfl:  .  labetalol (NORMODYNE) 100 MG tablet, Take 2 tablets (200 mg total) by mouth 2 (two) times daily. (Patient not taking: Reported on 10/30/2019), Disp: 60 tablet, Rfl: 0 .  lisinopril (PRINIVIL,ZESTRIL) 10 MG tablet, Take 1 tablet (10 mg total) by mouth daily. (Patient not taking: Reported on 10/30/2019),  Disp: 30 tablet, Rfl: 0 .  metoCLOPramide (REGLAN) 5 MG tablet, Take 1 tablet (5 mg total) by mouth 3 (three) times daily for 3 days. (Patient not taking: Reported on 04/24/2019), Disp: 9 tablet, Rfl: 0 .  mycophenolate (CELLCEPT) 250 MG capsule, Take 1,000 mg by mouth 2 (two) times daily., Disp: , Rfl:  .  ondansetron (ZOFRAN) 4 MG tablet, Take 1 tablet (4 mg total) by mouth every 8 (eight) hours as needed for nausea or vomiting. (Patient not taking: Reported on 10/30/2019), Disp: 10 tablet, Rfl: 0 .  pantoprazole (PROTONIX) 40 MG tablet, Take 1 tablet (40 mg total) by mouth daily., Disp: 30 tablet, Rfl: 0 .   potassium chloride 20 MEQ TBCR, Take 20 mEq by mouth daily. (Patient not taking: Reported on 10/30/2019), Disp: 7 tablet, Rfl: 0 .  senna (SENOKOT) 8.6 MG TABS tablet, Take 1 tablet by mouth 2 (two) times daily., Disp: , Rfl:  .  silver sulfADIAZINE (SILVADENE) 1 % cream, Apply 1 application topically daily. (Patient not taking: Reported on 10/30/2019), Disp: 50 g, Rfl: 0 .  tacrolimus (PROGRAF) 5 MG capsule, Take 5 mg by mouth 2 (two) times daily., Disp: , Rfl:  .  traZODone (DESYREL) 50 MG tablet, Take 50 mg by mouth at bedtime as needed for sleep., Disp: , Rfl:   No Known Allergies      Objective:  Physical Exam  General: AAO x3, NAD  Dermatological: Small superficial area of skin breakdown on the dorsal aspect of the right third toe but appears mostly healed.  There is no edema, erythema and signs of infection.  No other open lesions identified at this time.  Vascular: Dorsalis Pedis artery and Posterior Tibial artery pedal pulses are decreased on the right side. There is no pain with calf compression, swelling, warmth, erythema.   Neruologic: Sensation decreased with Phoebe Perch monofilament  Musculoskeletal: Left below-knee amputation    Assessment:   Healing ulceration right third toe, uncontrolled diabetes with history of left below-knee amputation, neuropathy/PAD    Plan:  -Treatment options discussed including all alternatives, risks, and complications -Etiology of symptoms were discussed -The wound that was present almost fully healed no signs of infection.  I would apply a small amount of antibiotic ointment to the area daily and if not healed in the next 1 to 2 weeks to let me know or sooner if there is any worsening.  I do think will benefit from diabetic shoes I have faxed a prescription to the [facility] prosthetic department. -I have also ordered arterial duplex. -Discussed daily foot inspection  Vivi Barrack DPM

## 2020-06-25 ENCOUNTER — Encounter (HOSPITAL_COMMUNITY): Payer: Self-pay | Admitting: Pediatrics

## 2020-06-25 ENCOUNTER — Other Ambulatory Visit: Payer: Self-pay

## 2020-06-25 ENCOUNTER — Ambulatory Visit (HOSPITAL_COMMUNITY)
Admission: EM | Admit: 2020-06-25 | Discharge: 2020-06-25 | Disposition: A | Discharge status: Nursing Home (Includes ICF) | Payer: Medicare Other | Attending: Emergency Medicine | Admitting: Emergency Medicine

## 2020-06-25 ENCOUNTER — Emergency Department (HOSPITAL_COMMUNITY)
Admission: EM | Admit: 2020-06-25 | Discharge: 2020-06-26 | Disposition: A | Discharge status: Home / Self Care | Payer: No Typology Code available for payment source | Attending: Emergency Medicine | Admitting: Emergency Medicine

## 2020-06-25 DIAGNOSIS — R61 Generalized hyperhidrosis: Secondary | ICD-10-CM

## 2020-06-25 DIAGNOSIS — R112 Nausea with vomiting, unspecified: Secondary | ICD-10-CM

## 2020-06-25 DIAGNOSIS — Z87891 Personal history of nicotine dependence: Secondary | ICD-10-CM | POA: Diagnosis not present

## 2020-06-25 DIAGNOSIS — R42 Dizziness and giddiness: Secondary | ICD-10-CM

## 2020-06-25 DIAGNOSIS — I10 Essential (primary) hypertension: Secondary | ICD-10-CM | POA: Insufficient documentation

## 2020-06-25 DIAGNOSIS — R1110 Vomiting, unspecified: Secondary | ICD-10-CM

## 2020-06-25 DIAGNOSIS — Z7982 Long term (current) use of aspirin: Secondary | ICD-10-CM | POA: Insufficient documentation

## 2020-06-25 DIAGNOSIS — R000 Tachycardia, unspecified: Secondary | ICD-10-CM

## 2020-06-25 DIAGNOSIS — E1010 Type 1 diabetes mellitus with ketoacidosis without coma: Secondary | ICD-10-CM | POA: Diagnosis not present

## 2020-06-25 DIAGNOSIS — Z20822 Contact with and (suspected) exposure to covid-19: Secondary | ICD-10-CM | POA: Insufficient documentation

## 2020-06-25 DIAGNOSIS — Z794 Long term (current) use of insulin: Secondary | ICD-10-CM | POA: Insufficient documentation

## 2020-06-25 DIAGNOSIS — Z79899 Other long term (current) drug therapy: Secondary | ICD-10-CM | POA: Insufficient documentation

## 2020-06-25 LAB — BASIC METABOLIC PANEL
Anion gap: 12 (ref 5–15)
BUN: 15 mg/dL (ref 8–23)
CO2: 23 mmol/L (ref 22–32)
Calcium: 10.1 mg/dL (ref 8.9–10.3)
Chloride: 106 mmol/L (ref 98–111)
Creatinine, Ser: 0.82 mg/dL (ref 0.61–1.24)
GFR calc Af Amer: >60 mL/min (ref >60)
GFR calc non Af Amer: >60 mL/min (ref >60)
Glucose, Bld: 142 mg/dL — ABNORMAL HIGH (ref 70–99)
Potassium: 4.1 mmol/L (ref 3.5–5.1)
Sodium: 141 mmol/L (ref 135–145)

## 2020-06-25 LAB — CBC
HCT: 52.1 % — ABNORMAL HIGH (ref 39.0–52.0)
Hemoglobin: 16.2 g/dL (ref 13.0–17.0)
MCH: 26 pg (ref 26.0–34.0)
MCHC: 31.1 g/dL (ref 30.0–36.0)
MCV: 83.8 fL (ref 80.0–100.0)
Platelets: 132 10*3/uL — ABNORMAL LOW (ref 150–400)
RBC: 6.22 MIL/uL — ABNORMAL HIGH (ref 4.22–5.81)
RDW: 15.1 % (ref 11.5–15.5)
WBC: 8.7 10*3/uL (ref 4.0–10.5)
nRBC: 0 % (ref 0.0–0.2)

## 2020-06-25 LAB — CBG MONITORING, ED: Glucose-Capillary: 144 mg/dL — ABNORMAL HIGH (ref 70–99)

## 2020-06-25 NOTE — ED Notes (Signed)
EMS called for pt to be transported to [facility].

## 2020-06-25 NOTE — ED Notes (Signed)
Patient has not taken his antihypertensive medications today .

## 2020-06-25 NOTE — ED Notes (Signed)
Visitor requested help for patient.  Found patient standing at edge of parking lot vomiting.  Patient asking for a chair, vomiting and extremely diaphoretic.  .  Obtained a wheelchair, emesis bag and placed patient in room 8 and notified kelly tate, np of patient.

## 2020-06-25 NOTE — ED Provider Notes (Signed)
[facility]    CSN: [id number] Arrival date & time: 06/25/20  1521      History   Chief Complaint Chief Complaint  Patient presents with  . Dizziness  . Emesis    HPI [name]. is a 68 y.o. male.   Patient presents with sudden onset of diaphoresis, weakness, dizziness, nausea, vomiting since this morning.  He has vomited approximately 6 times today.  He called EMS and was evaluated by them but reports he was advised by EMS to come here because of the wait time in the ED.  Patient denies chest pain, shortness of breath, focal weakness, or other symptoms.  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  The history is provided by the patient.    Past Medical History:  Diagnosis Date  . Anxiety   . CPAP (continuous positive airway pressure) dependence   . Diabetes mellitus without complication (HCC)   . GERD (gastroesophageal reflux disease)   . Hypertension   . Legally blind in left eye, as defined in [place]   . PTSD (post-traumatic stress disorder)   . PVD (peripheral vascular disease) (HCC) 01/17/2015  . Renal disorder   . Sleep apnea   . Thyroid disease     Patient Active Problem List   Diagnosis Date Noted  . DKA (diabetic ketoacidoses) (HCC) 01/25/2020  . OSA on CPAP 09/13/2018  . Intractable nausea and vomiting 09/12/2018  . Kidney transplant recipient 09/12/2018  . DKA, type 1 (HCC) 01/05/2018  . Nausea and vomiting 10/19/2017  . Nausea & vomiting 10/18/2017  . Hematemesis 07/24/2017  . Hypertensive urgency 07/24/2017  . Acute GI bleeding 07/24/2017  . 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 S/P BKA (below knee amputation) unilateral (HCC) 02/20/2015  . Ischemic ulcer of toe (HCC)   . Peripheral vascular disease (HCC)   . PVD (peripheral vascular disease) (HCC) 01/17/2015  . Cellulitis and abscess of foot 01/13/2015  . Cellulitis and abscess  of foot excluding toe 01/13/2015  . Diabetes mellitus without complication (HCC) 01/13/2015  . Leukocytosis 01/13/2015  . Diabetes mellitus with peripheral vascular disease (HCC) 12/19/2007  . DYSLIPIDEMIA 12/19/2007  . ANEMIA 12/19/2007  . ANXIETY 12/19/2007  . Essential hypertension 12/19/2007  . TRANSIENT ISCHEMIC ATTACK 12/19/2007  . PERIPHERAL VASCULAR DISEASE 12/19/2007  . RENAL FAILURE 12/19/2007    Past Surgical History:  Procedure Laterality Date  . ABDOMINAL AORTAGRAM N/A 01/21/2015   Procedure: ABDOMINAL Ronny Flurry;  Surgeon: Chuck Hint, MD;  Location: [facility] CATH LAB;  Service: Cardiovascular;  Laterality: N/A;  . AMPUTATION Left 01/23/2015   Procedure: FIRST RAY AMPUTATION/LEFT FOOT;  Surgeon: Nadara Mustard, MD;  Location: [facility] OR;  Service: Orthopedics;  Laterality: Left;  . AMPUTATION Left 02/20/2015   Procedure: AMPUTATION BELOW KNEE;  Surgeon: Nadara Mustard, MD;  Location: [facility] OR;  Service: Orthopedics;  Laterality: Left;  . AV FISTULA PLACEMENT    . COLONOSCOPY    . COMBINED KIDNEY-PANCREAS TRANSPLANT Left 2009  . INCISION AND DRAINAGE ABSCESS Left 01/13/2015   Procedure: INCISION AND DRAINAGE OF LEFT FOOT FIRST RAY ABCESS;  Surgeon: Kathryne Hitch, MD;  Location: [facility] OR;  Service: Orthopedics;  Laterality: Left;  . LOWER EXTREMITY ANGIOGRAM Left 01/21/2015   Procedure: LOWER EXTREMITY ANGIOGRAM;  Surgeon: Chuck Hint, MD;  Location: [facility] CATH LAB;  Service: Cardiovascular;  Laterality: Left;       Home Medications  Prior to Admission medications   Medication Sig Start Date End Date Taking? Authorizing Provider  amLODipine (NORVASC) 10 MG tablet Take 10 mg by mouth daily.    [provider]  aspirin EC 81 MG tablet Take 81 mg by mouth daily.    [provider]  atorvastatin (LIPITOR) 80 MG tablet Take 40 mg by mouth at bedtime.     [provider]  cyclobenzaprine (FLEXERIL) 5 MG tablet Take 1 tablet (5 mg total) by  mouth 2 (two) times daily as needed for muscle spasms. 01/30/20   Fawze, Mina A, PA-C  furosemide (LASIX) 20 MG tablet Take 1 tablet (20 mg total) by mouth daily. Patient not taking: Reported on 01/30/2020 07/28/17   Osvaldo ShipperKrishnan, Gokul, MD  HYDROcodone-acetaminophen (NORCO/VICODIN) 5-325 MG tablet Take 1 tablet by mouth every 6 (six) hours as needed for severe pain. 01/30/20   Fawze, Mina A, PA-C  insulin glargine (LANTUS) 100 UNIT/ML injection Inject 20 Units into the skin daily.    [provider]  labetalol (NORMODYNE) 100 MG tablet Take 2 tablets (200 mg total) by mouth 2 (two) times daily. Patient not taking: Reported on 10/30/2019 07/28/17   Osvaldo ShipperKrishnan, Gokul, MD  lisinopril (PRINIVIL,ZESTRIL) 10 MG tablet Take 1 tablet (10 mg total) by mouth daily. Patient not taking: Reported on 10/30/2019 01/07/18   Burnadette PopAdhikari, Amrit, MD  metoCLOPramide (REGLAN) 5 MG tablet Take 1 tablet (5 mg total) by mouth 3 (three) times daily for 3 days. Patient not taking: Reported on 04/24/2019 10/20/17 01/05/26  Leatha GildingGherghe, Costin M, MD  mycophenolate (CELLCEPT) 250 MG capsule Take 1,000 mg by mouth 2 (two) times daily.    [provider]  ondansetron (ZOFRAN) 4 MG tablet Take 1 tablet (4 mg total) by mouth every 8 (eight) hours as needed for nausea or vomiting. Patient not taking: Reported on 10/30/2019 04/24/19   Devoria AlbeKnapp, Iva, MD  pantoprazole (PROTONIX) 40 MG tablet Take 1 tablet (40 mg total) by mouth daily. 07/28/17   Osvaldo ShipperKrishnan, Gokul, MD  potassium chloride 20 MEQ TBCR Take 20 mEq by mouth daily. Patient not taking: Reported on 10/30/2019 01/07/18   Burnadette PopAdhikari, Amrit, MD  senna (SENOKOT) 8.6 MG TABS tablet Take 1 tablet by mouth 2 (two) times daily.    [provider]  silver sulfADIAZINE (SILVADENE) 1 % cream Apply 1 application topically daily. Patient not taking: Reported on 10/30/2019 06/12/19   Eustace MooreNelson, Yvonne Sue, MD  tacrolimus (PROGRAF) 5 MG capsule Take 5 mg by mouth 2 (two) times daily.    [provider]  traZODone (DESYREL) 50 MG tablet Take 50 mg by mouth at bedtime as needed for sleep.    [provider]    Family History Family History  Problem Relation Age of Onset  . Hypertension Other     Social History Social History   Tobacco Use  . Smoking status: Former Games developermoker  . Smokeless tobacco: Never Used  . Tobacco comment: quit i [id number]  Substance Use Topics  . Alcohol use: Yes    Comment: 1 beer rarely   . Drug use: No     Allergies   Patient has no known allergies.   Review of Systems Review of Systems  Constitutional: Positive for diaphoresis. Negative for chills and fever.  HENT: Negative for ear pain and sore throat.   Eyes: Negative for pain and visual disturbance.  Respiratory: Negative for cough and shortness of breath.   Cardiovascular: Negative for chest pain and palpitations.  Gastrointestinal: Positive  for nausea and vomiting. Negative for abdominal pain.  Genitourinary: Negative for dysuria and hematuria.  Musculoskeletal: Negative for arthralgias and back pain.  Skin: Negative for color change and rash.  Neurological: Positive for dizziness and weakness. Negative for seizures and syncope.  All other systems reviewed and are negative.    Physical Exam Triage Vital Signs ED Triage Vitals  Enc Vitals Group     BP      Pulse      Resp      Temp      Temp src      SpO2      Weight      Height      Head Circumference      Peak Flow      Pain Score      Pain Loc      Pain Edu?      Excl. in GC?    No data found.  Updated Vital Signs BP (!) 181/80 (BP Location: Left Arm)   Pulse (!) 114   Temp 98 F (36.7 C) (Oral)   Resp (!) 22   SpO2 99%   Visual Acuity Right Eye Distance:   Left Eye Distance:   Bilateral Distance:    Right Eye Near:   Left Eye Near:    Bilateral Near:     Physical Exam Vitals and nursing note reviewed.  Constitutional:      General: He is in acute distress.     Appearance: He is  well-developed. He is ill-appearing and diaphoretic.  HENT:     Head: Normocephalic and atraumatic.     Mouth/Throat:     Mouth: Mucous membranes are dry.  Eyes:     Conjunctiva/sclera: Conjunctivae normal.  Cardiovascular:     Rate and Rhythm: Normal rate and regular rhythm.     Heart sounds: No murmur heard.   Pulmonary:     Effort: Pulmonary effort is normal. No respiratory distress.     Breath sounds: Normal breath sounds.  Abdominal:     Palpations: Abdomen is soft.     Tenderness: There is no abdominal tenderness. There is no guarding or rebound.  Musculoskeletal:     Cervical back: Neck supple.     Right lower leg: No edema.  Skin:    General: Skin is warm.  Neurological:     General: No focal deficit present.     Mental Status: He is alert and oriented to person, place, and time.  Psychiatric:        Mood and Affect: Mood normal.        Behavior: Behavior normal.      UC Treatments / Results  Labs (all labs ordered are listed, but only abnormal results are displayed) Labs Reviewed  CBG MONITORING, ED - Abnormal; Notable for the following components:      Result Value   Glucose-Capillary 144 (*)    All other components within normal limits    EKG   Radiology No results found.  Procedures Procedures (including critical care time)  Medications Ordered in UC Medications - No data to display  Initial Impression / Assessment and Plan / UC Course  I have reviewed the triage vital signs and the nursing notes.  Pertinent labs & imaging results that were available during my care of the patient were reviewed by me and considered in my medical decision making (see chart for details).   Dizziness, Diaphoresis, Tachycardia, Intractable vomiting. EKG shows sinus tachycardia, rate 108,  compared to previous from May 2021.  Sending patient to the ED via EMS.        Final Clinical Impressions(s) / UC Diagnoses   Final diagnoses:  Dizziness  Diaphoresis   Intractable vomiting with nausea, unspecified vomiting type  Tachycardia   Discharge Instructions   None    ED Prescriptions    None     PDMP not reviewed this encounter.   Mickie Bail, NP 06/25/20 1724

## 2020-06-25 NOTE — ED Notes (Signed)
Patient is being discharged from the Urgent Care and sent to the Emergency Department via EMS Per K. Arlana Pouch, NP patient is in need of higher level of care due to dizziness, weakness, emesis, diaphoresis. Patient is aware and verbalizes understanding of plan of care.  Vitals:   06/25/20 1703 06/25/20 1717  BP: (!) 181/110 (!) 181/80  Pulse: (!) 114   Resp: (!) 22   Temp: 98 F (36.7 C)   SpO2: 99%

## 2020-06-25 NOTE — ED Triage Notes (Signed)
Pt c/o sweatiness, weakness, dizziness, n/v acute onset this morning. Pt called EMS earlier today but was advised by EMS personnel to go to Urgent care of PCP 2/2 ER wait.  Pt also c/o congestion onset this weekend. Denies CP, SOB, pain to back/arm/jaw, extremity weakness, any change in urine recently. Has not taken any Rx today 2/2 n/v/ and general malaise, weakness. Pt skin cool, diaphoretic, alert, oriented x4.  EKG performed and results given to provider.  CBG performed:  144  Pt did not take his insulin today, no food/drinks today.

## 2020-06-25 NOTE — ED Triage Notes (Signed)
Arrived via EMS from [facility]; reported has dizziness, headache and EMS endorsed diaphoretic. Patient in triage stated nausea has resolved for now but still sweating. Denies pain when asked.

## 2020-06-26 ENCOUNTER — Emergency Department (HOSPITAL_COMMUNITY): Payer: No Typology Code available for payment source

## 2020-06-26 ENCOUNTER — Ambulatory Visit: Payer: Medicare Other | Admitting: Internal Medicine

## 2020-06-26 LAB — URINALYSIS, ROUTINE W REFLEX MICROSCOPIC
Bilirubin Urine: NEGATIVE
Glucose, UA: >=500 mg/dL — AB
Ketones, ur: 20 mg/dL — AB
Leukocytes,Ua: NEGATIVE
Nitrite: NEGATIVE
Protein, ur: >=300 mg/dL — AB
Specific Gravity, Urine: 1.023 (ref 1.005–1.030)
pH: 5 (ref 5.0–8.0)

## 2020-06-26 LAB — HEPATIC FUNCTION PANEL
ALT: 17 U/L (ref 0–44)
AST: 14 U/L — ABNORMAL LOW (ref 15–41)
Albumin: 4 g/dL (ref 3.5–5.0)
Alkaline Phosphatase: 81 U/L (ref 38–126)
Bilirubin, Direct: 0.1 mg/dL (ref 0.0–0.2)
Indirect Bilirubin: 0.8 mg/dL (ref 0.3–0.9)
Total Bilirubin: 0.9 mg/dL (ref 0.3–1.2)
Total Protein: 6.4 g/dL — ABNORMAL LOW (ref 6.5–8.1)

## 2020-06-26 LAB — LIPASE, BLOOD: Lipase: 17 U/L (ref 11–51)

## 2020-06-26 LAB — TROPONIN I (HIGH SENSITIVITY)
Troponin I (High Sensitivity): 11 ng/L (ref <18)
Troponin I (High Sensitivity): 12 ng/L (ref <18)

## 2020-06-26 LAB — RESPIRATORY PANEL BY RT PCR (FLU A&B, COVID)
Influenza A by PCR: NEGATIVE
Influenza B by PCR: NEGATIVE
SARS Coronavirus 2 by RT PCR: NEGATIVE

## 2020-06-26 LAB — CBG MONITORING, ED: Glucose-Capillary: 207 mg/dL — ABNORMAL HIGH (ref 70–99)

## 2020-06-26 MED ORDER — AMLODIPINE BESYLATE 5 MG PO TABS
10.0000 mg | ORAL_TABLET | Freq: Once | ORAL | Status: AC
  Administered 2020-06-26: 10 mg via ORAL
  Filled 2020-06-26: qty 2

## 2020-06-26 MED ORDER — SODIUM CHLORIDE 0.9 % IV BOLUS
1000.0000 mL | Freq: Once | INTRAVENOUS | Status: AC
  Administered 2020-06-26: 1000 mL via INTRAVENOUS

## 2020-06-26 MED ORDER — HYDRALAZINE HCL 20 MG/ML IJ SOLN
10.0000 mg | Freq: Once | INTRAMUSCULAR | Status: AC
  Administered 2020-06-26: 10 mg via INTRAVENOUS
  Filled 2020-06-26: qty 1

## 2020-06-26 MED ORDER — PROMETHAZINE HCL 25 MG/ML IJ SOLN
12.5000 mg | Freq: Once | INTRAMUSCULAR | Status: AC
  Administered 2020-06-26: 12.5 mg via INTRAVENOUS
  Filled 2020-06-26: qty 1

## 2020-06-26 MED ORDER — LISINOPRIL 10 MG PO TABS
10.0000 mg | ORAL_TABLET | Freq: Once | ORAL | Status: AC
  Administered 2020-06-26: 10 mg via ORAL
  Filled 2020-06-26: qty 1

## 2020-06-26 MED ORDER — LABETALOL HCL 5 MG/ML IV SOLN
10.0000 mg | Freq: Once | INTRAVENOUS | Status: AC
  Administered 2020-06-26: 10 mg via INTRAVENOUS
  Filled 2020-06-26: qty 4

## 2020-06-26 MED ORDER — ONDANSETRON HCL 4 MG/2ML IJ SOLN
4.0000 mg | Freq: Once | INTRAMUSCULAR | Status: AC
  Administered 2020-06-26: 4 mg via INTRAVENOUS
  Filled 2020-06-26: qty 2

## 2020-06-26 NOTE — ED Notes (Signed)
Pt able to ambulate in the hall without difficulty

## 2020-06-26 NOTE — Discharge Instructions (Addendum)
You are seen today for nausea and vomiting.  You were noted to be significantly hypertensive.  You were also dizzy.  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  Your work-up overall was reassuring including CT scan.  You were given your home meds and IV medications to reduce your blood pressure.  You overall improved.  It is very important that you take your blood pressure medications at home as directed.  Follow-up closely with your primary doctor for recheck of blood pressure as well as any adjustments in medications.

## 2020-06-26 NOTE — ED Notes (Signed)
Pt was observed laying on floor on right side.PT was talking to this tech and had no pain. Pt was diaphoretic and vomiting reddish tint sputum. Tech asked pt why was he on the ground. Pt stated, "I put myself on the floor." Tech asked if pt had LOC. PT stated, "no." This tech and another lobby tech assisted pt to wheelchair. Charge RN made aware. Pt was brought to hallway 21.

## 2020-06-26 NOTE — Progress Notes (Deleted)
Name: [name].  MRN/ DOB: [id number], 10/02/1950   Age/ Sex: 69 y.o., male    PCP: [name], MD   Reason for Endocrinology Evaluation: Type {NUMBERS 1 OR 2:522190} Diabetes Mellitus     Date of Initial Endocrinology Visit: 06/26/2020     PATIENT IDENTIFIER: [name]. is a 69 y.o. male with a past medical history of DM, TIA, HTN, OSA and dyslipidemia. The patient presented for initial endocrinology clinic visit on 06/26/2020 for consultative assistance with his diabetes management.    HPI: Mr. Wisham was    Diagnosed with DM in 2016 Prior Medications tried/Intolerance: *** Currently checking blood sugars *** x / day,  before breakfast and ***.  Hypoglycemia episodes : ***               Symptoms: ***                 Frequency: ***/  Hemoglobin A1c has ranged from 10.0%  in 2021, peaking at 11.8% in 2016. Patient required assistance for hypoglycemia:  Patient has required hospitalization within the last 1 year from hyper or hypoglycemia:   In terms of diet, the patient ***   HOME DIABETES REGIMEN: Lantus     Statin: YES ACE-I/ARB: YES Prior Diabetic Education: {Yes/No:11203}   METER DOWNLOAD SUMMARY: Date range evaluated: *** Fingerstick Blood Glucose Tests = *** Average Number Tests/Day = *** Overall Mean FS Glucose = *** Standard Deviation = ***  BG Ranges: Low = *** High = ***   Hypoglycemic Events/30 Days: BG < 50 = *** Episodes of symptomatic severe hypoglycemia = ***   DIABETIC COMPLICATIONS: Microvascular complications:   ***  Denies: ckd  Last eye exam: Completed   Macrovascular complications:   ***  Denies: CAD, PVD, CVA   PAST HISTORY: Past Medical History:  Past Medical History:  Diagnosis Date  . Anxiety   . CPAP (continuous positive airway pressure) dependence   . Diabetes mellitus without complication (HCC)   . GERD (gastroesophageal reflux disease)   . Hypertension   . Legally blind in left eye, as  defined in [place]   . PTSD (post-traumatic stress disorder)   . PVD (peripheral vascular disease) (HCC) 01/17/2015  . Renal disorder   . Sleep apnea   . Thyroid disease     Past Surgical History:  Past Surgical History:  Procedure Laterality Date  . ABDOMINAL AORTAGRAM N/A 01/21/2015   Procedure: ABDOMINAL Ronny Flurry;  Surgeon: Chuck Hint, MD;  Location[facility] CATH LAB;  Service: Cardiovascular;  Laterality: N/A;  . AMPUTATION Left 01/23/2015   Procedure: FIRST RAY AMPUTATION/LEFT FOOT;  Surgeon: Nadara Mustard, MD;  Location: [facility] OR;  Service: Orthopedics;  Laterality: Left;  . AMPUTATION Left 02/20/2015   Procedure: AMPUTATION BELOW KNEE;  Surgeon: Nadara Mustard, MD;  Location: [facility] OR;  Service: Orthopedics;  Laterality: Left;  . AV FISTULA PLACEMENT    . COLONOSCOPY    . COMBINED KIDNEY-PANCREAS TRANSPLANT Left 2009  . INCISION AND DRAINAGE ABSCESS Left 01/13/2015   Procedure: INCISION AND DRAINAGE OF LEFT FOOT FIRST RAY ABCESS;  Surgeon: Kathryne Hitch, MD;  Location: [facility] OR;  Service: Orthopedics;  Laterality: Left;  . LOWER EXTREMITY ANGIOGRAM Left 01/21/2015   Procedure: LOWER EXTREMITY ANGIOGRAM;  Surgeon: Chuck Hint, MD;  Location: [facility] CATH LAB;  Service: Cardiovascular;  Laterality: Left;      Social History:  reports that he has quit smoking. He has never used smokeless tobacco. He  reports current alcohol use. He reports that he does not use drugs. Family History:  Family History  Problem Relation Age of Onset  . Hypertension Other       HOME MEDICATIONS: Allergies as of 06/26/2020   No Known Allergies     Medication List       Accurate as of June 26, 2020 12:55 PM. If you have any questions, ask your nurse or doctor.        amLODipine 10 MG tablet Commonly known as: NORVASC Take 10 mg by mouth daily.   aspirin EC 81 MG tablet Take 81 mg by mouth daily.   atorvastatin 80 MG tablet Commonly known as: LIPITOR Take 40 mg by mouth at  bedtime.   cyclobenzaprine 5 MG tablet Commonly known as: FLEXERIL Take 1 tablet (5 mg total) by mouth 2 (two) times daily as needed for muscle spasms.   furosemide 20 MG tablet Commonly known as: LASIX Take 1 tablet (20 mg total) by mouth daily.   HYDROcodone-acetaminophen 5-325 MG tablet Commonly known as: NORCO/VICODIN Take 1 tablet by mouth every 6 (six) hours as needed for severe pain.   insulin glargine 100 UNIT/ML injection Commonly known as: LANTUS Inject 20 Units into the skin daily.   labetalol 100 MG tablet Commonly known as: NORMODYNE Take 2 tablets (200 mg total) by mouth 2 (two) times daily.   lisinopril 10 MG tablet Commonly known as: ZESTRIL Take 1 tablet (10 mg total) by mouth daily.   metoCLOPramide 5 MG tablet Commonly known as: Reglan Take 1 tablet (5 mg total) by mouth 3 (three) times daily for 3 days.   mycophenolate 250 MG capsule Commonly known as: CELLCEPT Take 1,000 mg by mouth 2 (two) times daily.   ondansetron 4 MG tablet Commonly known as: ZOFRAN Take 1 tablet (4 mg total) by mouth every 8 (eight) hours as needed for nausea or vomiting.   pantoprazole 40 MG tablet Commonly known as: PROTONIX Take 1 tablet (40 mg total) by mouth daily.   Potassium Chloride ER 20 MEQ Tbcr Take 20 mEq by mouth daily.   senna 8.6 MG Tabs tablet Commonly known as: SENOKOT Take 1 tablet by mouth 2 (two) times daily.   silver sulfADIAZINE 1 % cream Commonly known as: SILVADENE Apply 1 application topically daily.   tacrolimus 5 MG capsule Commonly known as: PROGRAF Take 5 mg by mouth 2 (two) times daily.   traZODone 50 MG tablet Commonly known as: DESYREL Take 50 mg by mouth at bedtime as needed for sleep.        ALLERGIES: No Known Allergies   REVIEW OF SYSTEMS: A comprehensive ROS was conducted with the patient and is negative except as per HPI and below:  ROS    OBJECTIVE:   VITAL SIGNS: There were no vitals taken for this visit.    PHYSICAL EXAM:  General: Pt appears well and is in NAD  Neck: General: Supple without adenopathy or carotid bruits. Thyroid: Thyroid size normal.  No goiter or nodules appreciated.  Lungs: Clear with good BS bilat with no rales, rhonchi, or wheezes  Heart: RRR with normal S1 and S2 and no gallops; no murmurs; no rub  Abdomen: Normoactive bowel sounds, soft, nontender, without masses or organomegaly palpable  Extremities:  Lower extremities - No pretibial edema. No lesions.  Skin: Normal texture and temperature to palpation. No rash noted. No Acanthosis nigricans/skin tags. No lipohypertrophy.  Neuro: MS is good with appropriate affect, pt is alert and  Ox3    DM foot exam:    DATA REVIEWED:  Lab Results  Component Value Date   HGBA1C 10.0 (H) 01/25/2020   HGBA1C 11.2 (H) 01/05/2018   HGBA1C 10.4 (H) 07/24/2017   Lab Results  Component Value Date   LDLCALC 65 01/18/2015   CREATININE 0.82 06/25/2020   No results found for: [facility]  Lab Results  Component Value Date   CHOL 108 01/18/2015   HDL 23 (L) 01/18/2015   LDLCALC 65 01/18/2015   TRIG 100 01/18/2015   CHOLHDL 4.7 01/18/2015        ASSESSMENT / PLAN / RECOMMENDATIONS:   1) Type *** Diabetes Mellitus, ***controlled, With*** complications - Most recent A1c of *** %. Goal A1c < *** %.  ***  Plan: GENERAL:  ***  MEDICATIONS:  ***  EDUCATION / INSTRUCTIONS:  BG monitoring instructions: Patient is instructed to check his blood sugars *** times a day, ***.  Call [facility] if: BG persistently < 70 or > 300. . I reviewed the Rule of 15 for the treatment of hypoglycemia in detail with the patient. Literature supplied.   2) Diabetic complications:   Eye: Does *** have known diabetic retinopathy.   Neuro/ Feet: Does *** have known diabetic peripheral neuropathy.  Renal: Patient does *** have known baseline CKD. He is *** on an ACEI/ARB at present.Check urine albumin/creatinine ratio  yearly starting at time of diagnosis. If albuminuria is positive, treatment is geared toward better glucose, blood pressure control and use of ACE inhibitors or ARBs. Monitor electrolytes and creatinine once to twice yearly.   3) Lipids: Patient is *** on a statin.    4) Hypertension: ***  at goal of < 140/90 mmHg.       Signed electronically by: Lyndle Herrlich, MD  [facility]  [facility] [address], [address] [place], [place] [address] Phone: [phone] FAX: [phone]   CC: [name], MD [address] [place] [address] Phone: [phone]  Fax: [phone]    Return to Endocrinology clinic as below: Future Appointments  Date Time Provider Department Center  06/26/2020  3:00 PM Nevin Kozuch, Konrad Dolores, MD [facility] None  07/23/2020  1:45 PM Vivi Barrack, DPM [facility]

## 2020-06-26 NOTE — ED Provider Notes (Signed)
[facility] EMERGENCY DEPARTMENT Provider Note   CSN: [id number] Arrival date & time: 06/25/20  1748     History Chief Complaint  Patient presents with  . Nausea  . Dizziness    [name] is a 69 y.o. male.  HPI     This is a 69 year old male with a history of diabetes, hypertension, and status post renal, pancreas transplant who presents with nausea, vomiting, dizziness.  Patient called EMS earlier yesterday.  He was advised to be evaluated by urgent care or his PCP.  He was found to be notably hypertensive and diaphoretic.  He states he woke up yesterday morning and has had nausea and vomiting.  No abdominal pain or diarrhea.  He reports that he has had at least 10 episodes of nonbilious, nonbloody emesis.  He states at times he has had chest pain but is chest pain-free right now.  No recent fevers, cough, shortness of breath.  He has been unable to tolerate his blood pressure medications today.  He reports lightheadedness.  Denies room spinning dizziness.  Past Medical History:  Diagnosis Date  . Anxiety   . CPAP (continuous positive airway pressure) dependence   . Diabetes mellitus without complication (HCC)   . GERD (gastroesophageal reflux disease)   . Hypertension   . [place]ally [place] in left eye, as [place]   . PTSD (post-traumatic stress disorder)   . PVD (peripheral vascular disease) (HCC) 01/17/2015  . Renal disorder   . Sleep apnea   . Thyroid disease     Patient Active Problem List   Diagnosis Date Noted  . DKA (diabetic ketoacidoses) (HCC) 01/25/2020  . OSA on CPAP 09/13/2018  . Intractable nausea and vomiting 09/12/2018  . Kidney transplant recipient 09/12/2018  . DKA, type 1 (HCC) 01/05/2018  . Nausea and vomiting 10/19/2017  . Nausea & vomiting 10/18/2017  . Hematemesis 07/24/2017  . Hypertensive urgency 07/24/2017  . Acute GI bleeding 07/24/2017  . 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 S/P BKA (below knee amputation) unilateral (HCC) 02/20/2015  . Ischemic ulcer of toe (HCC)   . Peripheral vascular disease (HCC)   . PVD (peripheral vascular disease) (HCC) 01/17/2015  . Cellulitis and abscess of foot 01/13/2015  . Cellulitis and abscess of foot excluding toe 01/13/2015  . Diabetes mellitus without complication (HCC) 01/13/2015  . Leukocytosis 01/13/2015  . Diabetes mellitus with peripheral vascular disease (HCC) 12/19/2007  . DYSLIPIDEMIA 12/19/2007  . ANEMIA 12/19/2007  . ANXIETY 12/19/2007  . Essential hypertension 12/19/2007  . TRANSIENT ISCHEMIC ATTACK 12/19/2007  . PERIPHERAL VASCULAR DISEASE 12/19/2007  . RENAL FAILURE 12/19/2007    Past Surgical History:  Procedure Laterality Date  . ABDOMINAL AORTAGRAM N/A 01/21/2015   Procedure: ABDOMINAL Ronny FlurryAORTAGRAM;  Surgeon: Chuck Hinthristopher S Dickson, MD;  [facility];  Service: Cardiovascular;  Laterality: N/A;  . AMPUTATION Left 01/23/2015   Procedure: FIRST RAY AMPUTATION/LEFT FOOT;  Surgeon: Nadara MustardMarcus Duda V, MD;  Location: [facility] OR;  Service: Orthopedics;  Laterality: Left;  . AMPUTATION Left 02/20/2015   Procedure: AMPUTATION BELOW KNEE;  Surgeon: Nadara MustardMarcus Duda V, MD;  Location: [facility] OR;  Service: Orthopedics;  Laterality: Left;  . AV FISTULA PLACEMENT    . COLONOSCOPY    . COMBINED KIDNEY-PANCREAS TRANSPLANT Left 2009  . INCISION AND DRAINAGE ABSCESS Left 01/13/2015   Procedure: INCISION AND DRAINAGE OF LEFT FOOT FIRST RAY ABCESS;  Surgeon: Kathryne Hitchhristopher Y Blackman, MD;  [facility];  Service: Orthopedics;  Laterality: Left;  . LOWER EXTREMITY ANGIOGRAM Left 01/21/2015   Procedure: LOWER EXTREMITY ANGIOGRAM;  Surgeon: Chuck Hint, MD;  Location: [facility] CATH LAB;  Service: Cardiovascular;  Laterality: Left;       Family History  Problem Relation Age of Onset  . Hypertension Other     Social History   Tobacco Use  . Smoking status: Former Games developer  . Smokeless tobacco: Never Used  . Tobacco  comment: quit i [id number]  Substance Use Topics  . Alcohol use: Yes    Comment: 1 beer rarely   . Drug use: No    Home Medications Prior to Admission medications   Medication Sig Start Date End Date Taking? Authorizing Provider  amLODipine (NORVASC) 10 MG tablet Take 10 mg by mouth daily.   Yes [provider]  aspirin EC 81 MG tablet Take 81 mg by mouth daily.   Yes [provider]  atorvastatin (LIPITOR) 80 MG tablet Take 40 mg by mouth at bedtime.    Yes [provider]  cyclobenzaprine (FLEXERIL) 5 MG tablet Take 1 tablet (5 mg total) by mouth 2 (two) times daily as needed for muscle spasms. 01/30/20   Fawze, Mina A, PA-C  furosemide (LASIX) 20 MG tablet Take 1 tablet (20 mg total) by mouth daily. Patient not taking: Reported on 01/30/2020 07/28/17   Osvaldo Shipper, MD  HYDROcodone-acetaminophen (NORCO/VICODIN) 5-325 MG tablet Take 1 tablet by mouth every 6 (six) hours as needed for severe pain. 01/30/20   Fawze, Mina A, PA-C  insulin glargine (LANTUS) 100 UNIT/ML injection Inject 20 Units into the skin daily.    [provider]  labetalol (NORMODYNE) 100 MG tablet Take 2 tablets (200 mg total) by mouth 2 (two) times daily. Patient not taking: Reported on 10/30/2019 07/28/17   Osvaldo Shipper, MD  lisinopril (PRINIVIL,ZESTRIL) 10 MG tablet Take 1 tablet (10 mg total) by mouth daily. Patient not taking: Reported on 10/30/2019 01/07/18   Burnadette Pop, MD  metoCLOPramide (REGLAN) 5 MG tablet Take 1 tablet (5 mg total) by mouth 3 (three) times daily for 3 days. Patient not taking: Reported on 04/24/2019 10/20/17 01/05/26  Leatha Gilding, MD  mycophenolate (CELLCEPT) 250 MG capsule Take 1,000 mg by mouth 2 (two) times daily.    [provider]  ondansetron (ZOFRAN) 4 MG tablet Take 1 tablet (4 mg total) by mouth every 8 (eight) hours as needed for nausea or vomiting. Patient not taking: Reported on 10/30/2019 04/24/19   Devoria Albe, MD  pantoprazole  (PROTONIX) 40 MG tablet Take 1 tablet (40 mg total) by mouth daily. 07/28/17   Osvaldo Shipper, MD  potassium chloride 20 MEQ TBCR Take 20 mEq by mouth daily. Patient not taking: Reported on 10/30/2019 01/07/18   Burnadette Pop, MD  senna (SENOKOT) 8.6 MG TABS tablet Take 1 tablet by mouth 2 (two) times daily.    [provider]  silver sulfADIAZINE (SILVADENE) 1 % cream Apply 1 application topically daily. Patient not taking: Reported on 10/30/2019 06/12/19   Eustace Moore, MD  tacrolimus (PROGRAF) 5 MG capsule Take 5 mg by mouth 2 (two) times daily.    [provider]  traZODone (DESYREL) 50 MG tablet Take 50 mg by mouth at bedtime as needed for sleep.    [provider]    Allergies    Patient has no known allergies.  Review of Systems   Review of Systems  Constitutional: Positive for diaphoresis. Negative for  fatigue.  Respiratory: Negative for shortness of breath.   Cardiovascular: Positive for chest pain.  Gastrointestinal: Positive for nausea and vomiting. Negative for abdominal pain and diarrhea.  Neurological: Positive for light-headedness. Negative for dizziness and headaches.  All other systems reviewed and are negative.   Physical Exam Updated Vital Signs BP (!) 189/88   Pulse 97   Temp 97.7 F (36.5 C) (Oral)   Resp 20   SpO2 100%   Physical Exam Vitals and nursing note reviewed.  Constitutional:      Appearance: He is well-developed. He is ill-appearing and diaphoretic.     Comments: Nontoxic  HENT:     Head: Normocephalic and atraumatic.     Mouth/Throat:     Mouth: Mucous membranes are dry.  Eyes:     Pupils: Pupils are equal, round, and reactive to light.  Cardiovascular:     Rate and Rhythm: Regular rhythm. Tachycardia present.     Heart sounds: Normal heart sounds. No murmur heard.   Pulmonary:     Effort: Pulmonary effort is normal. No respiratory distress.     Breath sounds: Normal breath sounds. No wheezing.   Abdominal:     General: Bowel sounds are normal.     Palpations: Abdomen is soft.     Tenderness: There is no abdominal tenderness. There is no rebound.  Musculoskeletal:     Cervical back: Neck supple.     Comments: Left BKA  Lymphadenopathy:     Cervical: No cervical adenopathy.  Skin:    General: Skin is warm.  Neurological:     Mental Status: He is alert and oriented to person, place, and time.     Comments: Cranial nerves II through XII intact, 5 out of 5 strength in all 4 extremities, no dysmetria to finger-nose-finger  Psychiatric:        Mood and Affect: Mood normal.     ED Results / Procedures / Treatments   Labs (all labs ordered are listed, but only abnormal results are displayed) Labs Reviewed  BASIC METABOLIC PANEL - Abnormal; Notable for the following components:      Result Value   Glucose, Bld 142 (*)    All other components within normal limits  CBC - Abnormal; Notable for the following components:   RBC 6.22 (*)    HCT 52.1 (*)    Platelets 132 (*)    All other components within normal limits  URINALYSIS, ROUTINE W REFLEX MICROSCOPIC - Abnormal; Notable for the following components:   Glucose, UA >=500 (*)    Hgb urine dipstick MODERATE (*)    Ketones, ur 20 (*)    Protein, ur >=300 (*)    Bacteria, UA RARE (*)    All other components within normal limits  HEPATIC FUNCTION PANEL - Abnormal; Notable for the following components:   Total Protein 6.4 (*)    AST 14 (*)    All other components within normal limits  CBG MONITORING, ED - Abnormal; Notable for the following components:   Glucose-Capillary 207 (*)    All other components within normal limits  RESPIRATORY PANEL BY RT PCR (FLU A&B, COVID)  LIPASE, BLOOD  TROPONIN I (HIGH SENSITIVITY)  TROPONIN I (HIGH SENSITIVITY)    EKG EKG Interpretation  Date/Time:  Tuesday June 25 2020 18:18:17 EDT Ventricular Rate:  106 PR Interval:  228 QRS Duration: 80 QT Interval:  332 QTC  Calculation: 441 R Axis:   85 Text Interpretation: Sinus tachycardia with 1st degree A-V  block Right atrial enlargement Borderline ECG very similar to earlier in day Confirmed by Marily Memos [phone]) on 06/25/2020 11:00:00 PM   Radiology DG Chest 2 View  Result Date: 06/26/2020 CLINICAL DATA:  Chest pain, diaphoresis, and vomiting. EXAM: CHEST - 2 VIEW COMPARISON:  01/30/2020 FINDINGS: Shallow inspiration. Heart size and pulmonary vascularity are normal. Lungs are clear. No pleural effusions. No pneumothorax. Mediastinal contours appear intact. IMPRESSION: Shallow inspiration. No evidence of active pulmonary disease. Electronically Signed   By: Burman Nieves M.D.   On: 06/26/2020 01:32   CT Head Wo Contrast  Result Date: 06/26/2020 CLINICAL DATA:  Dizziness, diaphoresis, and vomiting. EXAM: CT HEAD WITHOUT CONTRAST TECHNIQUE: Contiguous axial images were obtained from the base of the skull through the vertex without intravenous contrast. COMPARISON:  07/27/2018 FINDINGS: Brain: No evidence of acute infarction, hemorrhage, hydrocephalus, extra-axial collection or mass lesion/mass effect. Vascular: No hyperdense vessel or unexpected calcification. Skull: Normal. Negative for fracture or focal lesion. Sinuses/Orbits: No acute finding. Other: None. IMPRESSION: No acute intracranial abnormalities. Electronically Signed   By: Burman Nieves M.D.   On: 06/26/2020 05:26    Procedures Procedures (including critical care time)  CRITICAL CARE Performed by: Shon Baton   Total critical care time: 60 minutes  Critical care time was exclusive of separately billable procedures and treating other patients.  Critical care was necessary to treat or prevent imminent or life-threatening deterioration.  Critical care was time spent personally by me on the following activities: development of treatment plan with patient and/or surrogate as well as nursing, discussions with consultants, evaluation of  patient's response to treatment, examination of patient, obtaining history from patient or surrogate, ordering and performing treatments and interventions, ordering and review of laboratory studies, ordering and review of radiographic studies, pulse oximetry and re-evaluation of patient's condition.   Medications Ordered in ED Medications  sodium chloride 0.9 % bolus 1,000 mL (0 mLs Intravenous Stopped 06/26/20 0432)  ondansetron (ZOFRAN) injection 4 mg (4 mg Intravenous Given 06/26/20 0115)  labetalol (NORMODYNE) injection 10 mg (10 mg Intravenous Given 06/26/20 0115)  labetalol (NORMODYNE) injection 10 mg (10 mg Intravenous Given 06/26/20 0430)  amLODipine (NORVASC) tablet 10 mg (10 mg Oral Given 06/26/20 0430)  lisinopril (ZESTRIL) tablet 10 mg (10 mg Oral Given 06/26/20 0430)  hydrALAZINE (APRESOLINE) injection 10 mg (10 mg Intravenous Given 06/26/20 0539)  promethazine (PHENERGAN) injection 12.5 mg (12.5 mg Intravenous Given 06/26/20 0540)    ED Course  I have reviewed the triage vital signs and the nursing notes.  Pertinent labs & imaging results that were available during my care of the patient were reviewed by me and considered in my medical decision making (see chart for details).  Clinical Course as of Jun 26 634  Wed Jun 26, 2020  6045 Patient states he feels much better.  However, persistently significantly hypertensive.  He is no longer diaphoretic.  He was given 1 additional dose of IV hydralazine.  All lab work reviewed and reassuring.  CT scan without hemorrhage or obvious changes.  Will reassess.  He may need admission for hypertensive urgency.   [CH]  4098 Patient chart reviewed.  Baseline blood pressures usually around 140-170 systolic.   [CH]  0620 Repeat blood pressure now 189/88.  Patient reports that he feels much better.  Will ambulate in the hallway.  He may have had some element of hypertensive urgency but seems to be recovering and blood pressures are downtrending.  He  had vomiting yesterday which kept  him from taking his blood pressure medication feel that this was likely the culprit of his malignant hypertension.  I have offered him observation admission but he wishes to go home.  Recommend that he monitor his blood pressures closely.   [CH]    Clinical Course User Index [CH] Seniya Stoffers, Mayer Masker, MD   MDM Rules/Calculators/A&P                           Patient presents with nausea and vomiting since yesterday morning.  He reports dizziness and is notably diaphoretic.  He reported headache for triage narcs but denies for me.  He is ill-appearing but nontoxic.  Significantly hypertensive with systolic blood pressures in the 200s.  His neurologic exam is normal.  Concern for hypertensive urgency, emergency.  No evidence of acute stroke.  He also reported some intermittent chest pain.  EKG obtained and shows no evidence of arrhythmia or acute ischemic changes.  Troponin x2 was negative.  Chest x-ray independently reviewed by myself and shows no evidence of pneumothorax or pneumonia.  Patient with a history of diabetes and questionable gastroparesis.  Question whether his blood pressure is related to inability to tolerate his medications.  Patient was given nausea medication and IV blood pressure medication.  Lab work reviewed.  No significant metabolic derangements.  No evidence of DKA.  No leukocytosis.  CT scan without acute bleed or abnormality.  Patient had some persistently elevated blood pressures requiring additional dose of medication.  He was also given his home dose of Norvasc and lisinopril.  Patient gradually improved.  No longer diaphoretic and on multiple rechecks states that he is no longer nauseated.  He remains neurologically intact.  He is able to tolerate fluids.  Patient wishes to be discharged home.  I have offered him observation admission for malignant hypertension versus hypertensive urgency.  However, given his overall improvement in symptoms and  downtrending blood pressure to 189/88, feel it is reasonable if he can p.o. challenge to discharge home.  He was able to tolerate fluids without difficulty.  He ambulated without issue and remained nonfocal.  Suspect he may have had some malignant hypertension related to not tolerating his medications.  He is no longer vomiting.  He has no abdominal pain I do not feel he needs abdominal imaging.  He reports he has nausea medication at home.  Will discharge home.  Recommend very close follow-up with PCP for blood pressure recheck.  He is also encouraged to take his blood pressure medication as prescribed  After history, exam, and medical workup I feel the patient has been appropriately medically screened and is safe for discharge home. Pertinent diagnoses were discussed with the patient. Patient was given return precautions.   Final Clinical Impression(s) / ED Diagnoses Final diagnoses:  Malignant hypertension  Non-intractable vomiting with nausea, unspecified vomiting type    Rx / DC Orders ED Discharge Orders    None       Shon Baton, MD 06/26/20 [id number]

## 2020-07-22 ENCOUNTER — Other Ambulatory Visit: Payer: Self-pay

## 2020-07-22 ENCOUNTER — Encounter: Payer: Self-pay | Admitting: Internal Medicine

## 2020-07-22 ENCOUNTER — Ambulatory Visit (INDEPENDENT_AMBULATORY_CARE_PROVIDER_SITE_OTHER): Payer: Medicare Other | Admitting: Internal Medicine

## 2020-07-22 VITALS — BP 116/74 | HR 78 | Ht 70.0 in | Wt 173.5 lb

## 2020-07-22 DIAGNOSIS — Z940 Kidney transplant status: Secondary | ICD-10-CM | POA: Diagnosis not present

## 2020-07-22 DIAGNOSIS — E785 Hyperlipidemia, unspecified: Secondary | ICD-10-CM

## 2020-07-22 DIAGNOSIS — Z794 Long term (current) use of insulin: Secondary | ICD-10-CM

## 2020-07-22 DIAGNOSIS — Z89512 Acquired absence of left leg below knee: Secondary | ICD-10-CM

## 2020-07-22 DIAGNOSIS — E1142 Type 2 diabetes mellitus with diabetic polyneuropathy: Secondary | ICD-10-CM

## 2020-07-22 DIAGNOSIS — Z9483 Pancreas transplant status: Secondary | ICD-10-CM

## 2020-07-22 DIAGNOSIS — E1151 Type 2 diabetes mellitus with diabetic peripheral angiopathy without gangrene: Secondary | ICD-10-CM

## 2020-07-22 LAB — MICROALBUMIN / CREATININE URINE RATIO
Creatinine,U: 148.4 mg/dL
Microalb Creat Ratio: 2.2 mg/g (ref 0.0–30.0)
Microalb, Ur: 3.3 mg/dL — ABNORMAL HIGH (ref 0.0–1.9)

## 2020-07-22 LAB — POCT GLYCOSYLATED HEMOGLOBIN (HGB A1C): Hemoglobin A1C: 7.9 % — AB (ref 4.0–5.6)

## 2020-07-22 LAB — GLUCOSE, POCT (MANUAL RESULT ENTRY): POC Glucose: 194 mg/dl — AB (ref 70–99)

## 2020-07-22 MED ORDER — METFORMIN HCL ER 500 MG PO TB24: 500.0000 mg | ORAL_TABLET | Freq: Two times a day (BID) | ORAL | 3 refills | Status: DC

## 2020-07-22 NOTE — Patient Instructions (Signed)
-   Start Metformin 500 mg , 1 tablet with Breakfast for 1 week, if no nausea or vomiting, after 1 weeks increase to 1 tablet with Breakfast and 1 tablet with Supper  - Decrease Lantus to 30 units ONCE daily      HOW TO TREAT LOW BLOOD SUGARS (Blood sugar LESS THAN 70 MG/DL)  Please follow the RULE OF 15 for the treatment of hypoglycemia treatment (when your (blood sugars are less than 70 mg/dL)    STEP 1: Take 15 grams of carbohydrates when your blood sugar is low, which includes:   3-4 GLUCOSE TABS  OR  3-4 OZ OF JUICE OR REGULAR SODA OR  ONE TUBE OF GLUCOSE GEL     STEP 2: RECHECK blood sugar in 15 MINUTES STEP 3: If your blood sugar is still low at the 15 minute recheck --> then, go back to STEP 1 and treat AGAIN with another 15 grams of carbohydrates.

## 2020-07-22 NOTE — Progress Notes (Signed)
Name: [name].  MRN/ DOB: [id number], Oct 12, 1950   Age/ Sex: 69 y.o., male    PCP: [name], MD   Reason for Endocrinology Evaluation: Type 2 Diabetes Mellitus     Date of Initial Endocrinology Visit: 07/22/2020     PATIENT IDENTIFIER: [name]. is a 69 y.o. 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 The patient presented for initial endocrinology clinic visit on 07/22/2020 for consultative assistance with his diabetes management.    HPI: [name] was    Diagnosed with DM 1990 Prior Medications tried/Intolerance: Was on insulin for yrs , was unable to get off after his transplant sx. Started on insulin and actos in 2010 Currently checking blood sugars 1 x / day Hypoglycemia episodes : no             Hemoglobin A1c has ranged from 10.0% in 12/2019, peaking at 11.2% in 2019. Patient required assistance for hypoglycemia: no Patient has required hospitalization within the last 1 year from hyper or hypoglycemia: no  In terms of diet, the patient eats 2 meals a day, snacks during the day. Drinks occasional sugar -sweetened beverages   [facility] had [facility]ph[facility] since [facility]2018 [facility]. [facility]   HOME DIABETES REGIMEN: Lantus 22 units bid    Statin: yes ACE-I/ARB: yes   METER DOWNLOAD SUMMARY: Did not bring    DIABETIC COMPLICATIONS: Microvascular complications:   DR ? , Hx of ESRD S/P transplant , neuropathy   Denies:   Last eye exam: Completed 2020  Macrovascular complications:   PVD, S/P LBKA  Denies: CAD, CVA   PAST HISTORY: Past Medical History:  Past Medical History:  Diagnosis Date  . Anxiety   . CPAP (continuous positive airway pressure) dependence   . Diabetes mellitus without complication (HCC)   . GERD (gastroesophageal reflux disease)   . Hypertension   . Legally blind in left eye, as defined in [place]   . PTSD (post-traumatic stress disorder)   . PVD  (peripheral vascular disease) (HCC) 01/17/2015  . Renal disorder   . Sleep apnea   . Thyroid disease    Past Surgical History:  Past Surgical History:  Procedure Laterality Date  . ABDOMINAL AORTAGRAM N/A 01/21/2015   Procedure: ABDOMINAL Ronny Flurry;  Surgeon: Chuck Hint, MD;  Location: [facility] CATH LAB;  Service: Cardiovascular;  Laterality: N/A;  . AMPUTATION Left 01/23/2015   Procedure: FIRST RAY AMPUTATION/LEFT FOOT;  Surgeon: Nadara Mustard, MD;  Location: [facility] OR;  Service: Orthopedics;  Laterality: Left;  . AMPUTATION Left 02/20/2015   Procedure: AMPUTATION BELOW KNEE;  Surgeon: Nadara Mustard, MD;  Location: [facility] OR;  Service: Orthopedics;  Laterality: Left;  . AV FISTULA PLACEMENT    . COLONOSCOPY    . COMBINED KIDNEY-PANCREAS TRANSPLANT Left 2009  . INCISION AND DRAINAGE ABSCESS Left 01/13/2015   Procedure: INCISION AND DRAINAGE OF LEFT FOOT FIRST RAY ABCESS;  Surgeon: Kathryne Hitch, MD;  Location: [facility] OR;  Service: Orthopedics;  Laterality: Left;  . LOWER EXTREMITY ANGIOGRAM Left 01/21/2015   Procedure: LOWER EXTREMITY ANGIOGRAM;  Surgeon: Chuck Hint, MD;  Location: [facility] CATH LAB;  Service: Cardiovascular;  Laterality: Left;      Social History:  reports that he has quit smoking. He has never used smokeless tobacco. He reports current alcohol use. He reports that he does not use drugs. Family History:  Family History  Problem Relation Age of Onset  .  Hypertension Other      HOME MEDICATIONS: Allergies as of 07/22/2020   No Known Allergies     Medication List       Accurate as of July 22, 2020  1:24 PM. If you have any questions, ask your nurse or doctor.        amLODipine 10 MG tablet Commonly known as: NORVASC Take 10 mg by mouth daily.   aspirin EC 81 MG tablet Take 81 mg by mouth daily.   atorvastatin 80 MG tablet Commonly known as: LIPITOR Take 40 mg by mouth at bedtime.   cyclobenzaprine 5 MG tablet Commonly known as: FLEXERIL Take  1 tablet (5 mg total) by mouth 2 (two) times daily as needed for muscle spasms.   furosemide 20 MG tablet Commonly known as: LASIX Take 1 tablet (20 mg total) by mouth daily.   HYDROcodone-acetaminophen 5-325 MG tablet Commonly known as: NORCO/VICODIN Take 1 tablet by mouth every 6 (six) hours as needed for severe pain.   insulin glargine 100 UNIT/ML injection Commonly known as: LANTUS Inject 20 Units into the skin daily.   labetalol 100 MG tablet Commonly known as: NORMODYNE Take 2 tablets (200 mg total) by mouth 2 (two) times daily.   lisinopril 10 MG tablet Commonly known as: ZESTRIL Take 1 tablet (10 mg total) by mouth daily.   metoCLOPramide 5 MG tablet Commonly known as: Reglan Take 1 tablet (5 mg total) by mouth 3 (three) times daily for 3 days.   mycophenolate 250 MG capsule Commonly known as: CELLCEPT Take 1,000 mg by mouth 2 (two) times daily.   ondansetron 4 MG tablet Commonly known as: ZOFRAN Take 1 tablet (4 mg total) by mouth every 8 (eight) hours as needed for nausea or vomiting.   pantoprazole 40 MG tablet Commonly known as: PROTONIX Take 1 tablet (40 mg total) by mouth daily.   Potassium Chloride ER 20 MEQ Tbcr Take 20 mEq by mouth daily.   senna 8.6 MG Tabs tablet Commonly known as: SENOKOT Take 1 tablet by mouth 2 (two) times daily.   silver sulfADIAZINE 1 % cream Commonly known as: SILVADENE Apply 1 application topically daily.   tacrolimus 5 MG capsule Commonly known as: PROGRAF Take 5 mg by mouth 2 (two) times daily.   traZODone 50 MG tablet Commonly known as: DESYREL Take 50 mg by mouth at bedtime as needed for sleep.        ALLERGIES: No Known Allergies   REVIEW OF SYSTEMS: A comprehensive ROS was conducted with the patient and is negative except as per HPI    OBJECTIVE:   VITAL SIGNS: BP 116/74   Pulse 78   Ht 5\' 10"  (1.778 m)   Wt 173 lb 8 oz (78.7 kg)   SpO2 99%   BMI 24.89 kg/m    PHYSICAL EXAM:  General: Pt  appears well and is in NAD  Neck: General: Supple without adenopathy or carotid bruits. Thyroid: Thyroid size normal.  No goiter or nodules appreciated. .  Lungs: Clear with good BS bilat with no rales, rhonchi, or wheezes  Heart: RRR with normal S1 and S2 and no gallops; no murmurs; no rub  Abdomen: Normoactive bowel sounds, soft, nontender, without masses or organomegaly palpable  Extremities:  No pretibial edema on the right   Skin: Normal texture and temperature to palpation.  Neuro: MS is good with appropriate affect, pt is alert and Ox3    DM foot exam:  Left BKA  The skin of  the feet is without sores or ulcerations on the right The pedal pulse undetectable  on right  The sensation is absent to a screening 5.07, 10 gram monofilament on the right     DATA REVIEWED:  Lab Results  Component Value Date   HGBA1C 7.9 (A) 07/22/2020   HGBA1C 10.0 (H) 01/25/2020   HGBA1C 11.2 (H) 01/05/2018   Lab Results  Component Value Date   LDLCALC 65 01/18/2015   CREATININE 0.82 06/25/2020   No results found for: [facility]  Lab Results  Component Value Date   CHOL 108 01/18/2015   HDL 23 (L) 01/18/2015   LDLCALC 65 01/18/2015   TRIG 100 01/18/2015   CHOLHDL 4.7 01/18/2015       Results for CRISTOFHER, LIVECCHI (MRN [id number]) as of 07/23/2020 14:51  Ref. Range 07/22/2020 14:05  Glucose Latest Ref Range: 65 - 99 mg/dL 726 (H)  C-Peptide Latest Ref Range: 0.80 - 3.85 ng/mL 1.27  Results for COLBIE, DANNER (MRN [id number]) as of 07/23/2020 14:51  Ref. 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 Goal A1c < 7.0 %.    I have discussed with the patient the pathophysiology of diabetes. We went over the natural  progression of the disease. We talked about both insulin resistance and insulin deficiency. We stressed the importance of lifestyle changes including diet and exercise. I explained the complications associated with diabetes including retinopathy, nephropathy, neuropathy as well as increased risk of cardiovascular disease. We went over the benefit seen with glycemic control.    I explained to the patient that diabetic patients are at higher than normal risk for amputations.   Will check C-peptide with concomitant serum glucose as well as GAD-65   He does not recall being on Metformin, we discussed GI side effects. Renal function normal, will titrate slowly  Pt encouraged to avoid sugar-sweetened beverages   C-peptide 1.27 ng/mL with a concomitant serum glucose of 177 mg/dL , the ot has some endogenous production of insulin but its not enough for hyperglycemia   We may consider SGLT-1 inhibitors in the future.     MEDICATIONS:  Decrease Lantus to 30 units daily   Start Metformin 500 mg BID   EDUCATION / INSTRUCTIONS:  BG monitoring instructions: Patient is instructed to check his blood sugars 1 times a day, fasting.  Call [place] [facility] if: BG persistently < 70  . I reviewed the Rule of 15 for the treatment of hypoglycemia in detail with the patient. Literature supplied.   2) Diabetic complications:   Eye: Does not have known diabetic retinopathy.   Neuro/ Feet: Does have known diabetic peripheral neuropathy.  Renal: Patient does  have Hx of ESRD requiring HD but is S/P transplant (2007). He is on an ACEI/ARB at present.  3) Dyslipidemia: Patient is on Atorvastatin 80 mg daily . Discussed cardiovascular benefits of statins, encouraged compliance      Signed electronically by: Lyndle Herrlich, MD  [facility]  [facility] [address], [address] [place], [place] [address] Phone: [phone] FAX: [phone]   CC: [name], MD [address] [place] [address] Phone: [phone]  Fax: [phone]    Return to Endocrinology clinic as below:  Future Appointments  Date Time Provider Department Center  07/23/2020  1:45 PM Vivi BarrackWagoner, Matthew R, DPM [facility]

## 2020-07-23 ENCOUNTER — Encounter: Payer: Self-pay | Admitting: Internal Medicine

## 2020-07-23 ENCOUNTER — Ambulatory Visit: Payer: Medicare Other | Admitting: Podiatry

## 2020-07-23 DIAGNOSIS — Z89512 Acquired absence of left leg below knee: Secondary | ICD-10-CM | POA: Diagnosis not present

## 2020-07-23 DIAGNOSIS — I739 Peripheral vascular disease, unspecified: Secondary | ICD-10-CM

## 2020-07-23 DIAGNOSIS — L97511 Non-pressure chronic ulcer of other part of right foot limited to breakdown of skin: Secondary | ICD-10-CM

## 2020-07-23 DIAGNOSIS — E1040 Type 1 diabetes mellitus with diabetic neuropathy, unspecified: Secondary | ICD-10-CM

## 2020-07-23 NOTE — Patient Instructions (Signed)
Diabetes Mellitus and Foot Care Foot care is an important part of your health, especially when you have diabetes. Diabetes may cause you to have problems because of poor blood flow (circulation) to your feet and legs, which can cause your skin to:  Become thinner and drier.  Break more easily.  Heal more slowly.  Peel and crack. You may also have nerve damage (neuropathy) in your legs and feet, causing decreased feeling in them. This means that you may not notice minor injuries to your feet that could lead to more serious problems. Noticing and addressing any potential problems early is the best way to prevent future foot problems. How to care for your feet Foot hygiene  Wash your feet daily with warm water and mild soap. Do not use hot water. Then, pat your feet and the areas between your toes until they are completely dry. Do not soak your feet as this can dry your skin.  Trim your toenails straight across. Do not dig under them or around the cuticle. File the edges of your nails with an emery board or nail file.  Apply a moisturizing lotion or petroleum jelly to the skin on your feet and to dry, brittle toenails. Use lotion that does not contain alcohol and is unscented. Do not apply lotion between your toes. Shoes and socks  Wear clean socks or stockings every day. Make sure they are not too tight. Do not wear knee-high stockings since they may decrease blood flow to your legs.  Wear shoes that fit properly and have enough cushioning. Always look in your shoes before you put them on to be sure there are no objects inside.  To break in new shoes, wear them for just a few hours a day. This prevents injuries on your feet. Wounds, scrapes, corns, and calluses  Check your feet daily for blisters, cuts, bruises, sores, and redness. If you cannot see the bottom of your feet, use a mirror or ask someone for help.  Do not cut corns or calluses or try to remove them with medicine.  If you  find a minor scrape, cut, or break in the skin on your feet, keep it and the skin around it clean and dry. You may clean these areas with mild soap and water. Do not clean the area with peroxide, alcohol, or iodine.  If you have a wound, scrape, corn, or callus on your foot, look at it several times a day to make sure it is healing and not infected. Check for: ? Redness, swelling, or pain. ? Fluid or blood. ? Warmth. ? Pus or a bad smell. General instructions  Do not cross your legs. This may decrease blood flow to your feet.  Do not use heating pads or hot water bottles on your feet. They may burn your skin. If you have lost feeling in your feet or legs, you may not know this is happening until it is too late.  Protect your feet from hot and cold by wearing shoes, such as at the beach or on hot pavement.  Schedule a complete foot exam at least once a year (annually) or more often if you have foot problems. If you have foot problems, report any cuts, sores, or bruises to your health care provider immediately. Contact a health care provider if:  You have a medical condition that increases your risk of infection and you have any cuts, sores, or bruises on your feet.  You have an injury that is not   healing.  You have redness on your legs or feet.  You feel burning or tingling in your legs or feet.  You have pain or cramps in your legs and feet.  Your legs or feet are numb.  Your feet always feel cold.  You have pain around a toenail. Get help right away if:  You have a wound, scrape, corn, or callus on your foot and: ? You have pain, swelling, or redness that gets worse. ? You have fluid or blood coming from the wound, scrape, corn, or callus. ? Your wound, scrape, corn, or callus feels warm to the touch. ? You have pus or a bad smell coming from the wound, scrape, corn, or callus. ? You have a fever. ? You have a red line going up your leg. Summary  Check your feet every day  for cuts, sores, red spots, swelling, and blisters.  Moisturize feet and legs daily.  Wear shoes that fit properly and have enough cushioning.  If you have foot problems, report any cuts, sores, or bruises to your health care provider immediately.  Schedule a complete foot exam at least once a year (annually) or more often if you have foot problems. This information is not intended to replace advice given to you by your health care provider. Make sure you discuss any questions you have with your health care provider. Document Revised: 06/07/2019 Document Reviewed: 10/16/2016 Elsevier Patient Education  2020 [employer] Inc[employer]  

## 2020-07-24 LAB — C-PEPTIDE: C-Peptide: 1.27 ng/mL (ref 0.80–3.85)

## 2020-07-24 LAB — GLUCOSE, FASTING: Glucose, Bld: 177 mg/dL — ABNORMAL HIGH (ref 65–99)

## 2020-07-24 LAB — GLUTAMIC ACID DECARBOXYLASE AUTO ABS: Glutamic Acid Decarb Ab: <5 IU/mL (ref <5)

## 2020-07-24 NOTE — Progress Notes (Signed)
Subjective: 69 year old male presents the office today for follow-up evaluation of a wound on the right third toe.  He states that the toe is doing well he thinks the area is healed.  Has not had any further ulcerations identified he denies any redness or drainage or any swelling to the foot. Denies any systemic complaints such as fevers, chills, nausea, vomiting. No acute changes since last appointment, and no other complaints at this time.   Objective: AAO x3, NAD DP/PT pulses palpable bilaterally, CRT less than 3 seconds Sensation decreased with Semmes Weinstein monofilament.  Left below-knee amputation At this time the wound was present on the right foot appears to be healed.  Is no skin breakdown identified.  There is no edema, erythema or any signs of infection. No pain with calf compression, swelling, warmth, erythema  Assessment: Healed wound right third toe  Plan: -All treatment options discussed with the patient including all alternatives, risks, complications.  -The wound is healed.  Discussed daily foot inspection.  I do think he would benefit from diabetic shoes.  We will fax a prescription to the [facility] prosthetic department. -Patient encouraged to call the office with any questions, concerns, change in symptoms.   Vivi Barrack DPM

## 2020-08-15 ENCOUNTER — Telehealth: Payer: Self-pay | Admitting: Podiatry

## 2020-08-15 NOTE — Telephone Encounter (Signed)
Called pt to let him know that I faxed the [facility] the rx for diabetic shoes.

## 2020-09-28 ENCOUNTER — Emergency Department (HOSPITAL_COMMUNITY)
Admission: EM | Admit: 2020-09-28 | Discharge: 2020-09-28 | Disposition: A | Discharge status: Home / Self Care | Payer: No Typology Code available for payment source | Attending: Emergency Medicine | Admitting: Emergency Medicine

## 2020-09-28 ENCOUNTER — Other Ambulatory Visit: Payer: Self-pay

## 2020-09-28 ENCOUNTER — Encounter (HOSPITAL_COMMUNITY): Payer: Self-pay

## 2020-09-28 DIAGNOSIS — E1142 Type 2 diabetes mellitus with diabetic polyneuropathy: Secondary | ICD-10-CM | POA: Insufficient documentation

## 2020-09-28 DIAGNOSIS — R112 Nausea with vomiting, unspecified: Secondary | ICD-10-CM | POA: Diagnosis present

## 2020-09-28 DIAGNOSIS — Z7984 Long term (current) use of oral hypoglycemic drugs: Secondary | ICD-10-CM | POA: Diagnosis not present

## 2020-09-28 DIAGNOSIS — U071 COVID-19: Secondary | ICD-10-CM

## 2020-09-28 DIAGNOSIS — Z87891 Personal history of nicotine dependence: Secondary | ICD-10-CM | POA: Diagnosis not present

## 2020-09-28 DIAGNOSIS — Z7982 Long term (current) use of aspirin: Secondary | ICD-10-CM | POA: Diagnosis not present

## 2020-09-28 DIAGNOSIS — Z8673 Personal history of transient ischemic attack (TIA), and cerebral infarction without residual deficits: Secondary | ICD-10-CM | POA: Insufficient documentation

## 2020-09-28 DIAGNOSIS — Z79899 Other long term (current) drug therapy: Secondary | ICD-10-CM | POA: Diagnosis not present

## 2020-09-28 DIAGNOSIS — I10 Essential (primary) hypertension: Secondary | ICD-10-CM | POA: Diagnosis not present

## 2020-09-28 DIAGNOSIS — Z794 Long term (current) use of insulin: Secondary | ICD-10-CM | POA: Insufficient documentation

## 2020-09-28 DIAGNOSIS — I951 Orthostatic hypotension: Secondary | ICD-10-CM

## 2020-09-28 HISTORY — DX: COVID-19: U07.1

## 2020-09-28 LAB — CBC
HCT: 46.1 % (ref 39.0–52.0)
Hemoglobin: 14.4 g/dL (ref 13.0–17.0)
MCH: 26.5 pg (ref 26.0–34.0)
MCHC: 31.2 g/dL (ref 30.0–36.0)
MCV: 84.9 fL (ref 80.0–100.0)
Platelets: 78 10*3/uL — ABNORMAL LOW (ref 150–400)
RBC: 5.43 MIL/uL (ref 4.22–5.81)
RDW: 15.3 % (ref 11.5–15.5)
WBC: 5.7 10*3/uL (ref 4.0–10.5)
nRBC: 0 % (ref 0.0–0.2)

## 2020-09-28 LAB — URINALYSIS, ROUTINE W REFLEX MICROSCOPIC
Bilirubin Urine: NEGATIVE
Glucose, UA: NEGATIVE mg/dL
Hgb urine dipstick: NEGATIVE
Ketones, ur: 5 mg/dL — AB
Leukocytes,Ua: NEGATIVE
Nitrite: NEGATIVE
Protein, ur: 100 mg/dL — AB
Specific Gravity, Urine: 1.025 (ref 1.005–1.030)
pH: 5 (ref 5.0–8.0)

## 2020-09-28 LAB — COMPREHENSIVE METABOLIC PANEL
ALT: 25 U/L (ref 0–44)
AST: 25 U/L (ref 15–41)
Albumin: 4.8 g/dL (ref 3.5–5.0)
Alkaline Phosphatase: 86 U/L (ref 38–126)
Anion gap: 11 (ref 5–15)
BUN: 20 mg/dL (ref 8–23)
CO2: 22 mmol/L (ref 22–32)
Calcium: 9.9 mg/dL (ref 8.9–10.3)
Chloride: 106 mmol/L (ref 98–111)
Creatinine, Ser: 1.13 mg/dL (ref 0.61–1.24)
GFR, Estimated: >60 mL/min (ref >60)
Glucose, Bld: 143 mg/dL — ABNORMAL HIGH (ref 70–99)
Potassium: 4.6 mmol/L (ref 3.5–5.1)
Sodium: 139 mmol/L (ref 135–145)
Total Bilirubin: 1 mg/dL (ref 0.3–1.2)
Total Protein: 7.5 g/dL (ref 6.5–8.1)

## 2020-09-28 LAB — LIPASE, BLOOD: Lipase: 19 U/L (ref 11–51)

## 2020-09-28 LAB — SARS CORONAVIRUS 2 (TAT 6-24 HRS): SARS Coronavirus 2: POSITIVE — AB

## 2020-09-28 MED ORDER — ONDANSETRON 8 MG PO TBDP: 8.0000 mg | ORAL_TABLET | Freq: Three times a day (TID) | ORAL | 0 refills | Status: DC | PRN

## 2020-09-28 NOTE — Discharge Instructions (Addendum)
We saw you in the ER for nausea, vomiting and sweats. You informed us that you have been feeling better, which is reassuring.  In the ER you have passed oral challenge and have not had any recurrent emesis under my supervision, which is also reassuring.  All the results in the ER are normal -we do see signs of mild dehydration. We are not sure what is causing your symptoms. The workup in the ER is not complete, and is limited to screening for life threatening and emergent conditions only, so please see a primary care doctor for further evaluation.  Please return to the ER if your symptoms worsen; you have increased pain, fevers, chills, inability to keep any medications down, confusion. Otherwise see the outpatient doctor as requested.

## 2020-09-28 NOTE — ED Triage Notes (Signed)
PER EMS nausea/vomiting x 1 day. Abdominal pain wih same, NAD

## 2020-09-28 NOTE — ED Provider Notes (Signed)
[facility] EMERGENCY DEPARTMENT Provider Note   CSN: [id number] Arrival date & time: 09/28/20  1030     History No chief complaint on file.   [name] Plemons. is a 70 y.o. male.  HPI     70 year old male comes in a chief complaint of weakness, nausea, vomiting.  Patient reports that he went to bed feeling fine.  In the middle the night he woke up with sweats and had nausea and vomiting.  Thereafter this morning he had another episode of nausea and vomiting and he was sweating, prompting him to come back to the ER.  Patient denies any abdominal pain associated with it.  At the moment he is feeling a lot better and denies any pain, nausea, vomiting.  Patient denies any night sweats.  No history of kidney stones and patient denies any burning with urination, blood in the urine, chest pain, shortness of breath.  Initially patient thought he had COVID-19 -but since he is feeling better he does not think he has it.  He denies any sick contacts.  Patient has no history of heart attack.  Past Medical History:  Diagnosis Date  . Anxiety   . CPAP (continuous positive airway pressure) dependence   . Diabetes mellitus without complication (HCC)   . GERD (gastroesophageal reflux disease)   . Hypertension   . Legally blind in left eye, as defined in [place]   . PTSD (post-traumatic stress disorder)   . PVD (peripheral vascular disease) (HCC) 01/17/2015  . Renal disorder   . Sleep apnea   . Thyroid disease     Patient Active Problem List   Diagnosis Date Noted  . History of left below knee amputation (HCC) 07/22/2020  . Type 2 diabetes mellitus with diabetic polyneuropathy, with long-term current use of insulin (HCC) 07/22/2020  . 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 Dyslipidemia 07/22/2020  . DKA (diabetic ketoacidoses) 01/25/2020  . OSA on CPAP 09/13/2018  . Intractable nausea and vomiting 09/12/2018  . Kidney transplant recipient  09/12/2018  . DKA, type 1 (HCC) 01/05/2018  . Nausea and vomiting 10/19/2017  . Nausea & vomiting 10/18/2017  . Hematemesis 07/24/2017  . Hypertensive urgency 07/24/2017  . Acute GI bleeding 07/24/2017  . 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 S/P BKA (below knee amputation) unilateral (HCC) 02/20/2015  . Ischemic ulcer of toe (HCC)   . Peripheral vascular disease (HCC)   . PVD (peripheral vascular disease) (HCC) 01/17/2015  . Cellulitis and abscess of foot 01/13/2015  . Cellulitis and abscess of foot excluding toe 01/13/2015  . Diabetes mellitus without complication (HCC) 01/13/2015  . Leukocytosis 01/13/2015  . Diabetes mellitus with peripheral vascular disease (HCC) 12/19/2007  . DYSLIPIDEMIA 12/19/2007  . ANEMIA 12/19/2007  . ANXIETY 12/19/2007  . Essential hypertension 12/19/2007  . TRANSIENT ISCHEMIC ATTACK 12/19/2007  . PERIPHERAL VASCULAR DISEASE 12/19/2007  . RENAL FAILURE 12/19/2007    Past Surgical History:  Procedure Laterality Date  . ABDOMINAL AORTAGRAM N/A 01/21/2015   Procedure: ABDOMINAL Ronny Flurry;  Surgeon: Chuck Hint, MD;  Location: [facility] CATH LAB;  Service: Cardiovascular;  Laterality: N/A;  . AMPUTATION Left 01/23/2015   Procedure: FIRST RAY AMPUTATION/LEFT FOOT;  Surgeon: Nadara Mustard, MD;  Location: [facility] OR;  Service: Orthopedics;  Laterality: Left;  . AMPUTATION Left 02/20/2015   Procedure: AMPUTATION BELOW KNEE;  Surgeon: Nadara Mustard, MD;  Location: [facility] OR;  Service: Orthopedics;  Laterality:  Left;  . AV FISTULA PLACEMENT    . COLONOSCOPY    . COMBINED KIDNEY-PANCREAS TRANSPLANT Left 2009  . INCISION AND DRAINAGE ABSCESS Left 01/13/2015   Procedure: INCISION AND DRAINAGE OF LEFT FOOT FIRST RAY ABCESS;  Surgeon: Kathryne Hitch, MD;  Location: [facility] OR;  Service: Orthopedics;  Laterality: Left;  . LOWER EXTREMITY ANGIOGRAM Left 01/21/2015   Procedure: LOWER EXTREMITY ANGIOGRAM;  Surgeon: Chuck Hint, MD;  Location: [facility] CATH LAB;  Service: Cardiovascular;  Laterality: Left;       Family History  Problem Relation Age of Onset  . Hypertension Other     Social History   Tobacco Use  . Smoking status: Former Games developer  . Smokeless tobacco: Never Used  . Tobacco comment: quit i [id number]  Substance Use Topics  . Alcohol use: Yes    Comment: 1 beer rarely   . Drug use: No    Home Medications Prior to Admission medications   Medication Sig Start Date End Date Taking? Authorizing Provider  ondansetron (ZOFRAN ODT) 8 MG disintegrating tablet Take 1 tablet (8 mg total) by mouth every 8 (eight) hours as needed for nausea. 09/28/20  Yes Orenthal Debski, MD  amLODipine (NORVASC) 10 MG tablet Take 10 mg by mouth daily.    [provider]  aspirin EC 81 MG tablet Take 81 mg by mouth daily.    [provider]  atorvastatin (LIPITOR) 80 MG tablet Take 40 mg by mouth at bedtime.     [provider]  cyclobenzaprine (FLEXERIL) 5 MG tablet Take 1 tablet (5 mg total) by mouth 2 (two) times daily as needed for muscle spasms. 01/30/20   Fawze, Mina A, PA-C  furosemide (LASIX) 20 MG tablet Take 1 tablet (20 mg total) by mouth daily. Patient not taking: Reported on 07/22/2020 07/28/17   Osvaldo Shipper, MD  HYDROcodone-acetaminophen (NORCO/VICODIN) 5-325 MG tablet Take 1 tablet by mouth every 6 (six) hours as needed for severe pain. 01/30/20   Fawze, Mina A, PA-C  insulin glargine (LANTUS) 100 UNIT/ML injection Inject 20 Units into the skin daily.    [provider]  labetalol (NORMODYNE) 100 MG tablet Take 2 tablets (200 mg total) by mouth 2 (two) times daily. 07/28/17   Osvaldo Shipper, MD  lisinopril (PRINIVIL,ZESTRIL) 10 MG tablet Take 1 tablet (10 mg total) by mouth daily. 01/07/18   Burnadette Pop, MD  metFORMIN (GLUCOPHAGE-XR) 500 MG 24 hr tablet Take 1 tablet (500 mg total) by mouth 2 (two) times daily. 07/22/20   Shamleffer, Konrad Dolores, MD  metoCLOPramide  (REGLAN) 5 MG tablet Take 1 tablet (5 mg total) by mouth 3 (three) times daily for 3 days. 10/20/17 01/05/26  Leatha Gilding, MD  mycophenolate (CELLCEPT) 250 MG capsule Take 1,000 mg by mouth 2 (two) times daily.    [provider]  ondansetron (ZOFRAN) 4 MG tablet Take 1 tablet (4 mg total) by mouth every 8 (eight) hours as needed for nausea or vomiting. 04/24/19   Devoria Albe, MD  pantoprazole (PROTONIX) 40 MG tablet Take 1 tablet (40 mg total) by mouth daily. 07/28/17   Osvaldo Shipper, MD  potassium chloride 20 MEQ TBCR Take 20 mEq by mouth daily. 01/07/18   Burnadette Pop, MD  senna (SENOKOT) 8.6 MG TABS tablet Take 1 tablet by mouth 2 (two) times daily.    [provider]  silver sulfADIAZINE (SILVADENE) 1 % cream Apply 1 application topically daily. 06/12/19   Eustace Moore, MD  tacrolimus (  PROGRAF) 5 MG capsule Take 5 mg by mouth 2 (two) times daily.    [provider]  traZODone (DESYREL) 50 MG tablet Take 50 mg by mouth at bedtime as needed for sleep.    [provider]    Allergies    Patient has no known allergies.  Review of Systems   Review of Systems  Constitutional: Positive for activity change.  Respiratory: Negative for shortness of breath.   Cardiovascular: Negative for chest pain.  Gastrointestinal: Positive for nausea and vomiting. Negative for abdominal pain.  Genitourinary: Negative for dysuria, frequency and testicular pain.  Allergic/Immunologic: Negative for immunocompromised state.  Hematological: Does not bruise/bleed easily.  All other systems reviewed and are negative.   Physical Exam Updated Vital Signs BP (!) 151/82   Pulse 93   Temp 98.6 F (37 C) (Oral)   Resp 14   SpO2 100%   Physical Exam Vitals and nursing note reviewed.  Constitutional:      Appearance: He is well-developed.  HENT:     Head: Atraumatic.  Cardiovascular:     Rate and Rhythm: Normal rate.  Pulmonary:     Effort: Pulmonary effort is  normal.  Abdominal:     Tenderness: There is no abdominal tenderness.  Musculoskeletal:     Cervical back: Neck supple.  Skin:    General: Skin is warm.  Neurological:     Mental Status: He is alert and oriented to person, place, and time.     ED Results / Procedures / Treatments   Labs (all labs ordered are listed, but only abnormal results are displayed) Labs Reviewed  COMPREHENSIVE METABOLIC PANEL - Abnormal; Notable for the following components:      Result Value   Glucose, Bld 143 (*)    All other components within normal limits  CBC - Abnormal; Notable for the following components:   Platelets 78 (*)    All other components within normal limits  URINALYSIS, ROUTINE W REFLEX MICROSCOPIC - Abnormal; Notable for the following components:   APPearance HAZY (*)    Ketones, ur 5 (*)    Protein, ur 100 (*)    Bacteria, UA RARE (*)    All other components within normal limits  SARS CORONAVIRUS 2 (TAT 6-24 HRS)  LIPASE, BLOOD    EKG None  Radiology No results found.  Procedures Procedures (including critical care time)  Medications Ordered in ED Medications - No data to display  ED Course  I have reviewed the triage vital signs and the nursing notes.  Pertinent labs & imaging results that were available during my care of the patient were reviewed by me and considered in my medical decision making (see chart for details).    MDM Rules/Calculators/A&P                          70 year old male with history of diabetes comes in with chief complaint of nausea, vomiting and sweats.  He had no chest pain and he denies any shortness of breath, flank pain, UTI-like symptoms history of kidney stones or similar symptoms.  No night sweats, weight loss.  No recent surgeries, instrumentation and no immunosuppression.  He is not having any COVID-19-like symptoms either.  He feels a lot better now.  Unsure what the etiology is but does not appear like there is an imminent  vascular pathology or kidney stones.  COVID-19 test was sent.  Patient is noted to have mild thrombocytopenia,  advised him to follow-up with his PCP for repeat CBC as this could be just an acute marker for stress.  No headaches which is also reassuring.  Reassessed: Patient was orthostatic positive.  He passed oral challenge. Stable for discharge.  Outpatient COVID-19 test sent.  [name] Chait. was evaluated in Emergency Department on 09/28/2020 for the symptoms described in the history of present illness. He was evaluated in the context of the global COVID-19 pandemic, which necessitated consideration that the patient might be at risk for infection with the SARS-CoV-2 virus that causes COVID-19. Institutional protocols and algorithms that pertain to the evaluation of patients at risk for COVID-19 are in a state of rapid change based on information released by regulatory bodies including the CDC and federal and state organizations. These policies and algorithms were followed during the patient's care in the ED.   Pt reassessed. Pt's VSS and WNL. Pt's cap refill < 3 seconds. Pt has been hydrated in the ER and now passed po challenge. We will discharge with antiemetic. Strict ER return precautions have been discussed and pt will return if he is unable to tolerate fluids and symptoms are getting worse.   Final Clinical Impression(s) / ED Diagnoses Final diagnoses:  Orthostatic hypotension  Non-intractable vomiting with nausea, unspecified vomiting type    Rx / DC Orders ED Discharge Orders         Ordered    ondansetron (ZOFRAN ODT) 8 MG disintegrating tablet  Every 8 hours PRN        09/28/20 1826           Derwood Kaplan, MD 09/28/20 2155

## 2020-09-28 NOTE — ED Notes (Signed)
Patient Alert and oriented to baseline. Stable and ambulatory to baseline. Patient verbalized understanding of the discharge instructions.  Patient belongings were taken by the patient.   

## 2020-09-29 ENCOUNTER — Encounter: Payer: Self-pay | Admitting: Nurse Practitioner

## 2020-09-29 ENCOUNTER — Other Ambulatory Visit: Payer: Self-pay | Admitting: Nurse Practitioner

## 2020-09-29 ENCOUNTER — Telehealth (HOSPITAL_COMMUNITY): Payer: Self-pay

## 2020-09-29 DIAGNOSIS — U071 COVID-19: Secondary | ICD-10-CM

## 2020-09-29 DIAGNOSIS — I10 Essential (primary) hypertension: Secondary | ICD-10-CM | POA: Insufficient documentation

## 2020-09-29 DIAGNOSIS — Z794 Long term (current) use of insulin: Secondary | ICD-10-CM

## 2020-09-29 DIAGNOSIS — I739 Peripheral vascular disease, unspecified: Secondary | ICD-10-CM

## 2020-09-29 DIAGNOSIS — E1142 Type 2 diabetes mellitus with diabetic polyneuropathy: Secondary | ICD-10-CM

## 2020-09-29 NOTE — Progress Notes (Signed)
I connected by phone with [name]. on 09/29/2020 at 12:28 PM to discuss the potential use of a new treatment for mild to moderate COVID-19 viral infection in non-hospitalized patients.  This patient is a 70 y.o. male that meets the FDA criteria for Emergency Use Authorization of COVID monoclonal antibody casirivimab/imdevimab, bamlanivimab/etesevimab, or sotrovimab.  Has a (+) direct SARS-CoV-2 viral test result  Has mild or moderate COVID-19   Is NOT hospitalized due to COVID-19  Is within 10 days of symptom onset  Has at least one of the high risk factor(s) for progression to severe COVID-19 and/or hospitalization as defined in [place].  Specific high risk criteria : Older age (>/= 68 yo), Diabetes and Cardiovascular disease or hypertension   I have spoken and communicated the following to the patient or parent/caregiver regarding COVID monoclonal antibody treatment:  1. FDA has authorized the emergency use for the treatment of mild to moderate COVID-19 in adults and pediatric patients with positive results of direct SARS-CoV-2 viral testing who are 12 years of age and older weighing at least 40 kg, and who are at high risk for progressing to severe COVID-19 and/or hospitalization.  2. The significant known and potential risks and benefits of COVID monoclonal antibody, and the extent to which such potential risks and benefits are unknown.  3. Information on available alternative treatments and the risks and benefits of those alternatives, including clinical trials.  4. Patients treated with COVID monoclonal antibody should continue to self-isolate and use infection control measures (e.g., wear mask, isolate, social distance, avoid sharing personal items, clean and disinfect high touch surfaces, and frequent handwashing) according to CDC guidelines.   5. The patient or parent/caregiver has the option to accept or refuse COVID monoclonal antibody treatment.  After reviewing this  information with the patient, the patient has agreed to receive one of the available covid 19 monoclonal antibodies and will be provided an appropriate fact sheet prior to infusion.   Nicolasa Ducking, NP 09/29/2020 12:28 PM

## 2020-09-29 NOTE — Telephone Encounter (Addendum)
Called to discuss with patient about Covid symptoms and the use of one of the treatment options for those with mild to moderate Covid symptoms and at a high risk of hospitalization.     Pt may appear to qualify for this infusion due to co-morbid conditions and/or a member of an at-risk group in accordance with the FDA Emergency Use Authorization.    Symptom onset: 12/30, possibly earlier Vaccinated: No Qualified for Infusion: DM, HTN, TIA, PVD, PAD, SVI  Informed pt that an APP will be calling to follow up and verify information for treatment options.

## 2020-09-30 ENCOUNTER — Ambulatory Visit (HOSPITAL_COMMUNITY)
Admission: RE | Admit: 2020-09-30 | Discharge: 2020-09-30 | Disposition: A | Discharge status: Home / Self Care | Payer: Medicare Other | Source: Ambulatory Visit | Attending: Pulmonary Disease | Admitting: Pulmonary Disease

## 2020-09-30 DIAGNOSIS — U071 COVID-19: Secondary | ICD-10-CM | POA: Diagnosis present

## 2020-09-30 DIAGNOSIS — E1142 Type 2 diabetes mellitus with diabetic polyneuropathy: Secondary | ICD-10-CM | POA: Insufficient documentation

## 2020-09-30 DIAGNOSIS — I10 Essential (primary) hypertension: Secondary | ICD-10-CM

## 2020-09-30 DIAGNOSIS — Z794 Long term (current) use of insulin: Secondary | ICD-10-CM | POA: Insufficient documentation

## 2020-09-30 DIAGNOSIS — I739 Peripheral vascular disease, unspecified: Secondary | ICD-10-CM | POA: Diagnosis not present

## 2020-09-30 MED ORDER — SODIUM CHLORIDE 0.9 % IV SOLN: INTRAVENOUS | Status: DC | PRN

## 2020-09-30 MED ORDER — DIPHENHYDRAMINE HCL 50 MG/ML IJ SOLN: 50.0000 mg | Freq: Once | INTRAMUSCULAR | Status: DC | PRN

## 2020-09-30 MED ORDER — EPINEPHRINE 0.3 MG/0.3ML IJ SOAJ: 0.3000 mg | Freq: Once | INTRAMUSCULAR | Status: DC | PRN

## 2020-09-30 MED ORDER — FAMOTIDINE IN NACL 20-0.9 MG/50ML-% IV SOLN: 20.0000 mg | Freq: Once | INTRAVENOUS | Status: DC | PRN

## 2020-09-30 MED ORDER — ALBUTEROL SULFATE HFA 108 (90 BASE) MCG/ACT IN AERS: 2.0000 | INHALATION_SPRAY | Freq: Once | RESPIRATORY_TRACT | Status: DC | PRN

## 2020-09-30 MED ORDER — SOTROVIMAB 500 MG/8ML IV SOLN
500.0000 mg | Freq: Once | INTRAVENOUS | Status: AC
  Administered 2020-09-30: 500 mg via INTRAVENOUS

## 2020-09-30 MED ORDER — METHYLPREDNISOLONE SODIUM SUCC 125 MG IJ SOLR: 125.0000 mg | Freq: Once | INTRAMUSCULAR | Status: DC | PRN

## 2020-09-30 NOTE — Progress Notes (Signed)
Diagnosis: COVID-19  Physician: Dr. Patrick Wright  Procedure: Covid Infusion Clinic Med: Sotrovimab infusion - Provided patient with sotrovimab fact sheet for patients, parents, and caregivers prior to infusion.   Complications: No immediate complications noted  Discharge: Discharged home    

## 2020-09-30 NOTE — Discharge Instructions (Signed)
10 Things You Can Do to Manage Your COVID-19 Symptoms at Home If you have possible or confirmed COVID-19: 1. Stay home from work and school. And stay away from other public places. If you must go out, avoid using any kind of public transportation, ridesharing, or taxis. 2. Monitor your symptoms carefully. If your symptoms get worse, call your healthcare provider immediately. 3. Get rest and stay hydrated. 4. If you have a medical appointment, call the healthcare provider ahead of time and tell them that you have or may have COVID-19. 5. For medical emergencies, call 911 and notify the dispatch personnel that you have or may have COVID-19. 6. Cover your cough and sneezes with a tissue or use the inside of your elbow. 7. Wash your hands often with soap and water for at least 20 seconds or clean your hands with an alcohol-based hand sanitizer that contains at least 60% alcohol. 8. As much as possible, stay in a specific room and away from other people in your home. Also, you should use a separate bathroom, if available. If you need to be around other people in or outside of the home, wear a mask. 9. Avoid sharing personal items with other people in your household, like dishes, towels, and bedding. 10. Clean all surfaces that are touched often, like counters, tabletops, and doorknobs. Use household cleaning sprays or wipes according to the label instructions. cdc.gov/coronavirus 03/29/2019 This information is not intended to replace advice given to you by your health care provider. Make sure you discuss any questions you have with your health care provider. Document Revised: 08/31/2019 Document Reviewed: 08/31/2019 Elsevier Patient Education  2020 Elsevier Inc. What types of side effects do monoclonal antibody drugs cause?  Common side effects  In general, the more common side effects caused by monoclonal antibody drugs include: . Allergic reactions, such as hives or itching . Flu-like signs and  symptoms, including chills, fatigue, fever, and muscle aches and pains . Nausea, vomiting . Diarrhea . Skin rashes . Low blood pressure   The CDC is recommending patients who receive monoclonal antibody treatments wait at least 90 days before being vaccinated.  Currently, there are no data on the safety and efficacy of mRNA COVID-19 vaccines in persons who received monoclonal antibodies or convalescent plasma as part of COVID-19 treatment. Based on the estimated half-life of such therapies as well as evidence suggesting that reinfection is uncommon in the 90 days after initial infection, vaccination should be deferred for at least 90 days, as a precautionary measure until additional information becomes available, to avoid interference of the antibody treatment with vaccine-induced immune responses. If you have any questions or concerns after the infusion please call the Advanced Practice Provider on call at [phone]. This number is ONLY intended for your use regarding questions or concerns about the infusion post-treatment side-effects.  Please do not provide this number to others for use. For return to work notes please contact your primary care provider.   If someone you know is interested in receiving treatment please have them call the COVID hotline at [phone].   

## 2020-09-30 NOTE — Progress Notes (Signed)
Patient reviewed Fact Sheet for Patients, Parents, and Caregivers for Emergency Use Authorization (EUA) of Sotrovimab for the Treatment of Coronavirus. Patient also reviewed and is agreeable to the estimated cost of treatment. Patient is agreeable to proceed.   

## 2020-10-10 ENCOUNTER — Other Ambulatory Visit: Payer: Self-pay | Admitting: *Deleted

## 2020-10-10 DIAGNOSIS — I739 Peripheral vascular disease, unspecified: Secondary | ICD-10-CM

## 2020-10-23 ENCOUNTER — Ambulatory Visit: Payer: Medicare Other | Admitting: Internal Medicine

## 2020-10-23 NOTE — Progress Notes (Incomplete)
Name: [name].  Age/ Sex: 70 y.o., male   MRN/ DOB: [id number], 1950-10-22     PCP: [name], MD   Reason for Endocrinology Evaluation: Type 2 Diabetes Mellitus  Initial Endocrine Consultative Visit: 10/23/2020    PATIENT IDENTIFIER: [name]. is a 70 y.o. male with a past medical history of ***. The patient has followed with Endocrinology clinic since 10/23/2020 for consultative assistance with management of his diabetes.  DIABETIC HISTORY:  Mr. Appenzeller was diagnosed with DM*** ***, and started insulin therapy approximately *** years after diagnosis. ***. His hemoglobin A1c has ranged from *** in ***, peaking at *** in ***.   SUBJECTIVE:   During the last visit (***): ***  Today (10/23/2020): Mr. Tomkins  He checks his blood sugars *** times daily, preprandial to breakfast and ***. The patient has *** had hypoglycemic episodes since the last clinic visit, which typically occur *** x / - most often occuring ***. The patient is *** symptomatic with these episodes, with symptoms of {symptoms; hypoglycemia:9084048}. Otherwise, the patient {HAS/HAS NOT:522402} required any recent emergency interventions for hypoglycemia and {HAS/HAS NOT:522402} had recent hospitalizations secondary to hyper or hypoglycemic episodes.    ROS: As per HPI and as detailed below: ROS    HOME DIABETES REGIMEN:    Statin: *** ACE-I/ARB: *** Prior Diabetic Education: ***   METER DOWNLOAD SUMMARY: Date range evaluated: *** Fingerstick Blood Glucose Tests = *** Average Number Tests/Day = *** Overall Mean FS Glucose = *** Standard Deviation = ***  BG Ranges: Low = *** High = ***   Hypoglycemic Events/30 Days: BG < 50 = *** Episodes of symptomatic severe hypoglycemia = ***    DIABETIC COMPLICATIONS: Microvascular complications:   ***  Denies:   Last Eye Exam: Completed   Macrovascular complications:   ***  Denies: CAD, CVA, PVD   HISTORY:  Past Medical  History:  Past Medical History:  Diagnosis Date  . Anxiety   . COVID-19 virus infection 09/2020  . CPAP (continuous positive airway pressure) dependence   . Diabetes mellitus without complication (HCC)   . GERD (gastroesophageal reflux disease)   . Hypertension   . Legally blind in left eye, as defined in [place]   . PTSD (post-traumatic stress disorder)   . PVD (peripheral vascular disease) (HCC) 01/17/2015  . Renal disorder   . Sleep apnea   . Thyroid disease     Past Surgical History:  Past Surgical History:  Procedure Laterality Date  . ABDOMINAL AORTAGRAM N/A 01/21/2015   Procedure: ABDOMINAL Ronny Flurry;  Surgeon: Chuck Hint, MD;  Location: [facility] CATH LAB;  Service: Cardiovascular;  Laterality: N/A;  . AMPUTATION Left 01/23/2015   Procedure: FIRST RAY AMPUTATION/LEFT FOOT;  Surgeon: Nadara Mustard, MD;  [facility] OR;  Service: Orthopedics;  Laterality: Left;  . AMPUTATION Left 02/20/2015   Procedure: AMPUTATION BELOW KNEE;  Surgeon: Nadara Mustard, MD;  Location: [facility] OR;  Service: Orthopedics;  Laterality: Left;  . AV FISTULA PLACEMENT    . COLONOSCOPY    . COMBINED KIDNEY-PANCREAS TRANSPLANT Left 2009  . INCISION AND DRAINAGE ABSCESS Left 01/13/2015   Procedure: INCISION AND DRAINAGE OF LEFT FOOT FIRST RAY ABCESS;  Surgeon: Kathryne Hitch, MD;  Location: [facility] OR;  Service: Orthopedics;  Laterality: Left;  . LOWER EXTREMITY ANGIOGRAM Left 01/21/2015   Procedure: LOWER EXTREMITY ANGIOGRAM;  Surgeon: Chuck Hint, MD;  Location: [facility] CATH LAB;  Service: Cardiovascular;  Laterality: Left;  Social History:  reports that he has quit smoking. He has never used smokeless tobacco. He reports current alcohol use. He reports that he does not use drugs. Family History:  Family History  Problem Relation Age of Onset  . Hypertension Other       HOME MEDICATIONS: Allergies as of 10/23/2020   No Known Allergies     Medication List       Accurate as of October 23, 2020 12:03 PM. If you have any questions, ask your nurse or doctor.        amLODipine 10 MG tablet Commonly known as: NORVASC Take 10 mg by mouth daily.   aspirin EC 81 MG tablet Take 81 mg by mouth daily.   atorvastatin 80 MG tablet Commonly known as: LIPITOR Take 40 mg by mouth at bedtime.   cyclobenzaprine 5 MG tablet Commonly known as: FLEXERIL Take 1 tablet (5 mg total) by mouth 2 (two) times daily as needed for muscle spasms.   furosemide 20 MG tablet Commonly known as: LASIX Take 1 tablet (20 mg total) by mouth daily.   HYDROcodone-acetaminophen 5-325 MG tablet Commonly known as: NORCO/VICODIN Take 1 tablet by mouth every 6 (six) hours as needed for severe pain.   insulin glargine 100 UNIT/ML injection Commonly known as: LANTUS Inject 20 Units into the skin daily.   labetalol 100 MG tablet Commonly known as: NORMODYNE Take 2 tablets (200 mg total) by mouth 2 (two) times daily.   lisinopril 10 MG tablet Commonly known as: ZESTRIL Take 1 tablet (10 mg total) by mouth daily.   metFORMIN 500 MG 24 hr tablet Commonly known as: GLUCOPHAGE-XR Take 1 tablet (500 mg total) by mouth 2 (two) times daily.   metoCLOPramide 5 MG tablet Commonly known as: Reglan Take 1 tablet (5 mg total) by mouth 3 (three) times daily for 3 days.   mycophenolate 250 MG capsule Commonly known as: CELLCEPT Take 1,000 mg by mouth 2 (two) times daily.   ondansetron 4 MG tablet Commonly known as: ZOFRAN Take 1 tablet (4 mg total) by mouth every 8 (eight) hours as needed for nausea or vomiting.   ondansetron 8 MG disintegrating tablet Commonly known as: Zofran ODT Take 1 tablet (8 mg total) by mouth every 8 (eight) hours as needed for nausea.   pantoprazole 40 MG tablet Commonly known as: PROTONIX Take 1 tablet (40 mg total) by mouth daily.   Potassium Chloride ER 20 MEQ Tbcr Take 20 mEq by mouth daily.   senna 8.6 MG Tabs tablet Commonly known as: SENOKOT Take 1 tablet by  mouth 2 (two) times daily.   silver sulfADIAZINE 1 % cream Commonly known as: SILVADENE Apply 1 application topically daily.   tacrolimus 5 MG capsule Commonly known as: PROGRAF Take 5 mg by mouth 2 (two) times daily.   traZODone 50 MG tablet Commonly known as: DESYREL Take 50 mg by mouth at bedtime as needed for sleep.        OBJECTIVE:   Vital Signs: There were no vitals taken for this visit.  Wt Readings from Last 3 Encounters:  07/22/20 173 lb 8 oz (78.7 kg)  05/06/20 180 lb 12.4 oz (82 kg)  05/05/20 180 lb 12.4 oz (82 kg)     Exam: General: Pt appears well and is in NAD  Neck: General: Supple without adenopathy. Thyroid: Thyroid size normal.  No goiter or nodules appreciated.  Lungs: Clear with good BS bilat with no rales, rhonchi, or wheezes  Heart:  RRR with normal S1 and S2 and no gallops; no murmurs; no rub  Abdomen: Normoactive bowel sounds, soft, nontender, without masses or organomegaly palpable  Extremities: No pretibial edema.   Neuro: MS is good with appropriate affect, pt is alert and Ox3    DM foot exam: 07/22/2020  Left BKA  The skin of the feet is without sores or ulcerations on the right The pedal pulse undetectable  on right  The sensation is absent to a screening 5.07, 10 gram monofilament on the right     DATA REVIEWED:  Lab Results  Component Value Date   HGBA1C 7.9 (A) 07/22/2020   HGBA1C 10.0 (H) 01/25/2020   HGBA1C 11.2 (H) 01/05/2018   Lab Results  Component Value Date   MICROALBUR 3.3 (H) 07/22/2020   LDLCALC 65 01/18/2015   CREATININE 1.13 09/28/2020   Lab Results  Component Value Date   MICRALBCREAT 2.2 07/22/2020     Lab Results  Component Value Date   CHOL 108 01/18/2015   HDL 23 (L) 01/18/2015   LDLCALC 65 01/18/2015   TRIG 100 01/18/2015   CHOLHDL 4.7 01/18/2015         ASSESSMENT / PLAN / RECOMMENDATIONS:   Type 2 Diabetes Mellitus, Sub-OPtimally controlled, With Neuropathic  and macrovascular  complications, 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 Goal A1c < 7.0 %.   Plan: MEDICATIONS:  ***  EDUCATION / INSTRUCTIONS:  BG monitoring instructions: Patient is instructed to check his blood sugars *** times a day, ***.  Call [place] if: BG persistently < 70 . I reviewed the Rule of 15 for the treatment of hypoglycemia in detail with the patient. Literature supplied.     2) Diabetic complications:   Eye: Does not have known diabetic retinopathy.   Neuro/ Feet: Does  have known diabetic peripheral neuropathy .   Renal: Patient does  have Hx of ESRD requiring HD but is S/P transplant (2007). He is on an ACEI/ARB at present.       F/U in ***    Signed electronically by: Lyndle Herrlich, MD  [facility] Endocrinology  [facility] [address] [place] [place], [place] [address] Phone: [phone] FAX: [phone]   CC: [name], MD [address] [place] [address] Phone: [phone]  Fax: [phone]  Return to Endocrinology clinic as below: Future Appointments  Date Time Provider Department Center  10/23/2020  1:40 PM Lenard Kampf, Konrad Dolores, MD [facility] None  10/24/2020 12:00 PM [facility] - [facility]  10/24/2020 12:40 PM Fields, Janetta Hora, MD [facility]

## 2020-10-24 ENCOUNTER — Encounter (HOSPITAL_COMMUNITY): Payer: Medicare Other

## 2020-10-24 ENCOUNTER — Encounter: Payer: Medicare Other | Admitting: Vascular Surgery

## 2020-11-19 ENCOUNTER — Encounter: Payer: Medicare Other | Admitting: Vascular Surgery

## 2020-11-19 ENCOUNTER — Ambulatory Visit (HOSPITAL_COMMUNITY): Payer: Medicare Other | Attending: Vascular Surgery

## 2020-12-02 ENCOUNTER — Ambulatory Visit: Payer: Medicare Other | Admitting: Internal Medicine

## 2020-12-02 NOTE — Progress Notes (Deleted)
Name: [name].  Age/ Sex: 70 y.o., male   MRN/ DOB: [id number], May 14, 1951     PCP: [name], MD   Reason for Endocrinology Evaluation: Type 2 Diabetes Mellitus  Initial Endocrine Consultative Visit: 07/22/2020    PATIENT IDENTIFIER: [name]. is a 70 y.o. 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 The patient has followed with Endocrinology clinic since 07/22/2020 for consultative assistance with management of his diabetes.  DIABETIC HISTORY:  [name] was diagnosed with DM in 1990. Has been on  insulin for yrs , was unable to get off after his transplant sx.Marland Kitchen His hemoglobin A1c has ranged from 10.0% in 12/2019, peaking at 11.2% in 2019.  Has had [facility]phagia since [facility]/2018 [facility] Declined invasive procedure     On his initial visit to our clinic his A1c was 7.9% , we decreased lantus and started Metfomin SUBJECTIVE:   During the last visit (07/22/2020): A1c 7.9%, continued lantus and started Metformin   Today (12/02/2020): [name]  He checks his blood sugars *** times daily, preprandial to breakfast and ***. The patient has *** had hypoglycemic episodes since the last clinic visit, which typically occur *** x / - most often occuring ***. The patient is *** symptomatic with these episodes, with symptoms of {symptoms; hypoglycemia:9084048}.   HOME DIABETES REGIMEN:  Lantus to 30 units daily  Metformin 500 mg BID    Statin: yes ACE-I/ARB: yes    METER DOWNLOAD SUMMARY: Date range evaluated: *** Fingerstick Blood Glucose Tests = *** Average Number Tests/Day = *** Overall Mean FS Glucose = *** Standard Deviation = ***  BG Ranges: Low = *** High = ***   Hypoglycemic Events/30 Days: BG < 50 = *** Episodes of symptomatic severe hypoglycemia = ***    DIABETIC COMPLICATIONS: Microvascular complications:   DR ? , Hx of ESRD S/P transplant , neuropathy   Last Eye Exam: Completed  2020  Macrovascular complications:   PVD, S/P LBKA    Denies: CAD, CVA   HISTORY:  Past Medical History:  Past Medical History:  Diagnosis Date  . Anxiety   . COVID-19 virus infection 09/2020  . CPAP (continuous positive airway pressure) dependence   . Diabetes mellitus without complication (HCC)   . GERD (gastroesophageal reflux disease)   . Hypertension   . Legally blind in left eye, as defined in [place]   . PTSD (post-traumatic stress disorder)   . PVD (peripheral vascular disease) (HCC) 01/17/2015  . Renal disorder   . Sleep apnea   . Thyroid disease     Past Surgical History:  Past Surgical History:  Procedure Laterality Date  . ABDOMINAL AORTAGRAM N/A 01/21/2015   Procedure: ABDOMINAL Ronny Flurry;  Surgeon: Chuck Hint, MD;  Location: [facility] CATH LAB;  Service: Cardiovascular;  Laterality: N/A;  . AMPUTATION Left 01/23/2015   Procedure: FIRST RAY AMPUTATION/LEFT FOOT;  Surgeon: Nadara Mustard, MD;  [facility] OR;  Service: Orthopedics;  Laterality: Left;  . AMPUTATION Left 02/20/2015   Procedure: AMPUTATION BELOW KNEE;  Surgeon: Nadara Mustard, MD;  Location: [facility] OR;  Service: Orthopedics;  Laterality: Left;  . AV FISTULA PLACEMENT    . COLONOSCOPY    . COMBINED KIDNEY-PANCREAS TRANSPLANT Left 2009  . INCISION AND DRAINAGE ABSCESS Left 01/13/2015   Procedure: INCISION AND DRAINAGE OF LEFT FOOT FIRST RAY ABCESS;  Surgeon: Kathryne Hitch, MD;  Location: [facility] OR;  Service: Orthopedics;  Laterality:  Left;  . LOWER EXTREMITY ANGIOGRAM Left 01/21/2015   Procedure: LOWER EXTREMITY ANGIOGRAM;  Surgeon: Chuck Hint, MD;  Location: [facility] CATH LAB;  Service: Cardiovascular;  Laterality: Left;     Social History:  reports that he has quit smoking. He has never used smokeless tobacco. He reports current alcohol use. He reports that he does not use drugs. Family History:  Family History  Problem Relation Age of Onset  . Hypertension Other       HOME  MEDICATIONS: Allergies as of 12/02/2020   No Known Allergies     Medication List       Accurate as of December 02, 2020  7:31 AM. If you have any questions, ask your nurse or doctor.        amLODipine 10 MG tablet Commonly known as: NORVASC Take 10 mg by mouth daily.   aspirin EC 81 MG tablet Take 81 mg by mouth daily.   atorvastatin 80 MG tablet Commonly known as: LIPITOR Take 40 mg by mouth at bedtime.   cyclobenzaprine 5 MG tablet Commonly known as: FLEXERIL Take 1 tablet (5 mg total) by mouth 2 (two) times daily as needed for muscle spasms.   furosemide 20 MG tablet Commonly known as: LASIX Take 1 tablet (20 mg total) by mouth daily.   HYDROcodone-acetaminophen 5-325 MG tablet Commonly known as: NORCO/VICODIN Take 1 tablet by mouth every 6 (six) hours as needed for severe pain.   insulin glargine 100 UNIT/ML injection Commonly known as: LANTUS Inject 20 Units into the skin daily.   labetalol 100 MG tablet Commonly known as: NORMODYNE Take 2 tablets (200 mg total) by mouth 2 (two) times daily.   lisinopril 10 MG tablet Commonly known as: ZESTRIL Take 1 tablet (10 mg total) by mouth daily.   metFORMIN 500 MG 24 hr tablet Commonly known as: GLUCOPHAGE-XR Take 1 tablet (500 mg total) by mouth 2 (two) times daily.   metoCLOPramide 5 MG tablet Commonly known as: Reglan Take 1 tablet (5 mg total) by mouth 3 (three) times daily for 3 days.   mycophenolate 250 MG capsule Commonly known as: CELLCEPT Take 1,000 mg by mouth 2 (two) times daily.   ondansetron 4 MG tablet Commonly known as: ZOFRAN Take 1 tablet (4 mg total) by mouth every 8 (eight) hours as needed for nausea or vomiting.   ondansetron 8 MG disintegrating tablet Commonly known as: Zofran ODT Take 1 tablet (8 mg total) by mouth every 8 (eight) hours as needed for nausea.   pantoprazole 40 MG tablet Commonly known as: PROTONIX Take 1 tablet (40 mg total) by mouth daily.   Potassium Chloride ER 20  MEQ Tbcr Take 20 mEq by mouth daily.   senna 8.6 MG Tabs tablet Commonly known as: SENOKOT Take 1 tablet by mouth 2 (two) times daily.   silver sulfADIAZINE 1 % cream Commonly known as: SILVADENE Apply 1 application topically daily.   tacrolimus 5 MG capsule Commonly known as: PROGRAF Take 5 mg by mouth 2 (two) times daily.   traZODone 50 MG tablet Commonly known as: DESYREL Take 50 mg by mouth at bedtime as needed for sleep.        OBJECTIVE:   Vital Signs: There were no vitals taken for this visit.  Wt Readings from Last 3 Encounters:  07/22/20 173 lb 8 oz (78.7 kg)  05/06/20 180 lb 12.4 oz (82 kg)  05/05/20 180 lb 12.4 oz (82 kg)     Exam: General: Pt appears  well and is in NAD  Hydration: Well-hydrated with moist mucous membranes and good skin turgor  HEENT: Head: Unremarkable with good dentition. Oropharynx clear without exudate.  Eyes: External eye exam normal without stare, lid lag or exophthalmos.  EOM intact.  PERRL.  Neck: General: Supple without adenopathy. Thyroid: Thyroid size normal.  No goiter or nodules appreciated. No thyroid bruit.  Lungs: Clear with good BS bilat with no rales, rhonchi, or wheezes  Heart: RRR with normal S1 and S2 and no gallops; no murmurs; no rub  Abdomen: Normoactive bowel sounds, soft, nontender, without masses or organomegaly palpable  Extremities: No pretibial edema. No tremor. Normal strength and motion throughout. See detailed diabetic foot exam below.  Skin: Normal texture and temperature to palpation. No rash noted. No Acanthosis nigricans/skin tags. No lipohypertrophy.  Neuro: MS is good with appropriate affect, pt is alert and Ox3    DM foot exam: 07/22/2020  Left BKA  The skin of the feet is without sores or ulcerations on the right The pedal pulse undetectable  on right  The sensation is absent to a screening 5.07, 10 gram monofilament on the right     DATA REVIEWED:  Lab Results  Component Value Date    HGBA1C 7.9 (A) 07/22/2020   HGBA1C 10.0 (H) 01/25/2020   HGBA1C 11.2 (H) 01/05/2018   Lab Results  Component Value Date   MICROALBUR 3.3 (H) 07/22/2020   LDLCALC 65 01/18/2015   CREATININE 1.13 09/28/2020   Lab Results  Component Value Date   MICRALBCREAT 2.2 07/22/2020     Lab Results  Component Value Date   CHOL 108 01/18/2015   HDL 23 (L) 01/18/2015   LDLCALC 65 01/18/2015   TRIG 100 01/18/2015   CHOLHDL 4.7 01/18/2015       Results for Rip HarbourSMITH, Chason JR. (MRN [id number]) as of 07/23/2020 14:51  Ref. Range 07/22/2020 14:05  Glucose Latest Ref Range: 65 - 99 mg/dL 409177 (H)  C-Peptide Latest Ref Range: 0.80 - 3.85 ng/mL 1.27  Results for Rip HarbourSMITH, Larson JR. (MRN [id number]) as of 07/23/2020 14:51  Ref. 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 Goal A1c < 7.0 %.     C-peptide 1.27 ng/mL with a concomitant serum glucose of 177 mg/dL , the ot has some endogenous production of insulin but its not enough for hyperglycemia   We may consider SGLT-1 inhibitors in the future.   MEDICATIONS:  ***  EDUCATION / INSTRUCTIONS:  BG monitoring instructions: Patient is instructed to check his blood sugars *** times a day, ***.  Call [facility] if: BG persistently < 70  . I reviewed the Rule of 15 for the treatment of hypoglycemia in detail with the patient. Literature supplied.   2) Diabetic complications:   Eye: Does not have known diabetic retinopathy.   Neuro/ Feet: Does  have known diabetic peripheral neuropathy .   Renal:  Patient does  have Hx of ESRD requiring HD but is S/P transplant (2007). He is on an ACEI/ARB at present.     F/U in ***    Signed electronically  by: Lyndle HerrlichAbby Jaralla Tajae Rybicki, MD  [facility]  [facility] [address] [place], [place] [address]  Phone: [phone] FAX: [phone]   CC: [name], MD [address] [place] [address] Phone: [phone]  Fax: [phone]  Return to Endocrinology clinic as below: Future Appointments  Date Time Provider Department Center  12/02/2020  8:30 AM Caili Escalera, Konrad Dolores, MD [facility] None

## 2020-12-04 ENCOUNTER — Other Ambulatory Visit: Payer: Self-pay

## 2020-12-04 ENCOUNTER — Ambulatory Visit (INDEPENDENT_AMBULATORY_CARE_PROVIDER_SITE_OTHER): Payer: Medicare Other | Admitting: Internal Medicine

## 2020-12-04 ENCOUNTER — Encounter: Payer: Self-pay | Admitting: Internal Medicine

## 2020-12-04 VITALS — BP 126/82 | HR 82 | Resp 98 | Ht 70.0 in | Wt 171.2 lb

## 2020-12-04 DIAGNOSIS — E1142 Type 2 diabetes mellitus with diabetic polyneuropathy: Secondary | ICD-10-CM

## 2020-12-04 DIAGNOSIS — E1151 Type 2 diabetes mellitus with diabetic peripheral angiopathy without gangrene: Secondary | ICD-10-CM

## 2020-12-04 DIAGNOSIS — Z89512 Acquired absence of left leg below knee: Secondary | ICD-10-CM

## 2020-12-04 DIAGNOSIS — Z794 Long term (current) use of insulin: Secondary | ICD-10-CM | POA: Diagnosis not present

## 2020-12-04 LAB — POCT GLUCOSE (DEVICE FOR HOME USE): POC Glucose: 94 mg/dl (ref 70–99)

## 2020-12-04 LAB — POCT GLYCOSYLATED HEMOGLOBIN (HGB A1C): Hemoglobin A1C: 6.5 % — AB (ref 4.0–5.6)

## 2020-12-04 MED ORDER — INSULIN PEN NEEDLE 32G X 4 MM MISC: 1.0000 | 3 refills | Status: DC

## 2020-12-04 MED ORDER — LANTUS SOLOSTAR 100 UNIT/ML ~~LOC~~ SOPN: 28.0000 [IU] | PEN_INJECTOR | Freq: Every day | SUBCUTANEOUS | 3 refills | Status: DC

## 2020-12-04 MED ORDER — METFORMIN HCL ER 500 MG PO TB24: 500.0000 mg | ORAL_TABLET | Freq: Two times a day (BID) | ORAL | 3 refills | Status: DC

## 2020-12-04 NOTE — Patient Instructions (Addendum)
-   Keep up the Good Work ! - Continue Metformin 500 mg , 1 tablet with Breakfast  and 1 tablet with Supper - Decrease Lantus 27  units ONCE daily      HOW TO TREAT LOW BLOOD SUGARS (Blood sugar LESS THAN 70 MG/DL)  Please follow the RULE OF 15 for the treatment of hypoglycemia treatment (when your (blood sugars are less than 70 mg/dL)    STEP 1: Take 15 grams of carbohydrates when your blood sugar is low, which includes:   3-4 GLUCOSE TABS  OR  3-4 OZ OF JUICE OR REGULAR SODA OR  ONE TUBE OF GLUCOSE GEL     STEP 2: RECHECK blood sugar in 15 MINUTES STEP 3: If your blood sugar is still low at the 15 minute recheck --> then, go back to STEP 1 and treat AGAIN with another 15 grams of carbohydrates.

## 2020-12-04 NOTE — Progress Notes (Signed)
Name: [name].  Age/ Sex: 70 y.o., male   MRN/ DOB: [id number], 01/01/1951     PCP: [name], MD   Reason for Endocrinology Evaluation: Type 2 Diabetes Mellitus  Initial Endocrine Consultative Visit: 07/22/2020    PATIENT IDENTIFIER: [name]. is a 70 y.o. 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 The patient has followed with Endocrinology clinic since 07/22/2020 for consultative assistance with management of his diabetes.  DIABETIC HISTORY:  [name] was diagnosed with DM in 1990. Has been on  insulin for yrs , was unable to get off after his transplant sx.Marland Kitchen His hemoglobin A1c has ranged from 10.0% in 12/2019, peaking at 11.2% in 2019.  Has had dysphagia since [facility]/2018 - [facility] Declined invasive procedure     On his initial visit to our clinic his A1c was 7.9% , we decreased lantus and started Metfomin SUBJECTIVE:   During the last visit (07/22/2020): A1c 7.9%, continued lantus and started Metformin   Today (12/04/2020): [name] is here for a follow up on diabetes management.  He checks his blood sugars 1 times daily. The patient has  had hypoglycemic episodes since the last clinic visit, which typically occur rarely . The patient is  symptomatic with these episodes.   Last night he had diarrhea that he attributes to food poisoning, this has been improving today   Denies fever or abdominal pain    HOME DIABETES REGIMEN:  Lantus 30 units daily  Metformin 500 mg BID    Statin: yes ACE-I/ARB: yes    METER DOWNLOAD SUMMARY: Did not bring a meter     DIABETIC COMPLICATIONS: Microvascular complications:   DR ? , Hx of ESRD S/P transplant , neuropathy   Last Eye Exam: Completed 2020  Macrovascular complications:   PVD, S/P LBKA  Denies: CAD, CVA   HISTORY:  Past Medical History:  Past Medical History:  Diagnosis Date  . Anxiety   . COVID-19 virus infection 09/2020  .  CPAP (continuous positive airway pressure) dependence   . Diabetes mellitus without complication (HCC)   . GERD (gastroesophageal reflux disease)   . Hypertension   . Legally blind in left eye, as defined in [place]   . PTSD (post-traumatic stress disorder)   . PVD (peripheral vascular disease) (HCC) 01/17/2015  . Renal disorder   . Sleep apnea   . Thyroid disease    Past Surgical History:  Past Surgical History:  Procedure Laterality Date  . ABDOMINAL AORTAGRAM N/A 01/21/2015   Procedure: ABDOMINAL Ronny Flurry;  Surgeon: Chuck Hint, MD;  Location: [facility] CATH LAB;  Service: Cardiovascular;  Laterality: N/A;  . AMPUTATION Left 01/23/2015   Procedure: FIRST RAY AMPUTATION/LEFT FOOT;  Surgeon: Nadara Mustard, MD;  Location: [facility] OR;  Service: Orthopedics;  Laterality: Left;  . AMPUTATION Left 02/20/2015   Procedure: AMPUTATION BELOW KNEE;  Surgeon: Nadara Mustard, MD;  Location: [facility] OR;  Service: Orthopedics;  Laterality: Left;  . AV FISTULA PLACEMENT    . COLONOSCOPY    . COMBINED KIDNEY-PANCREAS TRANSPLANT Left 2009  . INCISION AND DRAINAGE ABSCESS Left 01/13/2015   Procedure: INCISION AND DRAINAGE OF LEFT FOOT FIRST RAY ABCESS;  Surgeon: Kathryne Hitch, MD;  Location: [facility] OR;  Service: Orthopedics;  Laterality: Left;  . LOWER EXTREMITY ANGIOGRAM Left 01/21/2015   Procedure: LOWER EXTREMITY ANGIOGRAM;  Surgeon: Chuck Hint, MD;  Location: [facility] CATH LAB;  Service: Cardiovascular;  Laterality:  Left;    Social History:  reports that he has quit smoking. He has never used smokeless tobacco. He reports current alcohol use. He reports that he does not use drugs. Family History:  Family History  Problem Relation Age of Onset  . Hypertension Other      HOME MEDICATIONS: Allergies as of 12/04/2020   No Known Allergies     Medication List       Accurate as of December 04, 2020  2:39 PM. If you have any questions, ask your nurse or doctor.        STOP taking these medications    HYDROcodone-acetaminophen 5-325 MG tablet Commonly known as: NORCO/VICODIN Stopped by: Scarlette Shorts, MD   senna 8.6 MG Tabs tablet Commonly known as: SENOKOT Stopped by: Scarlette Shorts, MD     TAKE these medications   amLODipine 10 MG tablet Commonly known as: NORVASC Take 10 mg by mouth daily.   aspirin EC 81 MG tablet Take 81 mg by mouth daily.   atorvastatin 80 MG tablet Commonly known as: LIPITOR Take 40 mg by mouth at bedtime.   cyclobenzaprine 5 MG tablet Commonly known as: FLEXERIL Take 1 tablet (5 mg total) by mouth 2 (two) times daily as needed for muscle spasms.   furosemide 20 MG tablet Commonly known as: LASIX Take 1 tablet (20 mg total) by mouth daily.   insulin glargine 100 UNIT/ML injection Commonly known as: LANTUS Inject 30 Units into the skin daily.   labetalol 100 MG tablet Commonly known as: NORMODYNE Take 2 tablets (200 mg total) by mouth 2 (two) times daily.   lisinopril 10 MG tablet Commonly known as: ZESTRIL Take 1 tablet (10 mg total) by mouth daily.   metFORMIN 500 MG 24 hr tablet Commonly known as: GLUCOPHAGE-XR Take 1 tablet (500 mg total) by mouth 2 (two) times daily.   metoCLOPramide 5 MG tablet Commonly known as: Reglan Take 1 tablet (5 mg total) by mouth 3 (three) times daily for 3 days.   mycophenolate 250 MG capsule Commonly known as: CELLCEPT Take 1,000 mg by mouth 2 (two) times daily.   ondansetron 4 MG tablet Commonly known as: ZOFRAN Take 1 tablet (4 mg total) by mouth every 8 (eight) hours as needed for nausea or vomiting.   ondansetron 8 MG disintegrating tablet Commonly known as: Zofran ODT Take 1 tablet (8 mg total) by mouth every 8 (eight) hours as needed for nausea.   pantoprazole 40 MG tablet Commonly known as: PROTONIX Take 1 tablet (40 mg total) by mouth daily.   Potassium Chloride ER 20 MEQ Tbcr Take 20 mEq by mouth daily.   silver sulfADIAZINE 1 % cream Commonly known as:  SILVADENE Apply 1 application topically daily.   tacrolimus 5 MG capsule Commonly known as: PROGRAF Take 5 mg by mouth 2 (two) times daily.   traZODone 50 MG tablet Commonly known as: DESYREL Take 50 mg by mouth at bedtime as needed for sleep.        OBJECTIVE:   Vital Signs: BP 126/82   Pulse 82   Resp (!) 98   Ht 5\' 10"  (1.778 m)   Wt 171 lb 4 oz (77.7 kg)   BMI 24.57 kg/m   Wt Readings from Last 3 Encounters:  12/04/20 171 lb 4 oz (77.7 kg)  07/22/20 173 lb 8 oz (78.7 kg)  05/06/20 180 lb 12.4 oz (82 kg)     Exam: General: Pt appears well and is in NAD  Lungs: Clear with good BS bilat   Heart: RRR , + systolic murmur  Neuro: MS is good with appropriate affect, pt is alert and Ox3    DM foot exam: 07/22/2020  Left BKA  The skin of the feet is without sores or ulcerations on the right The pedal pulse undetectable  on right  The sensation is absent to a screening 5.07, 10 gram monofilament on the right     DATA REVIEWED:  Lab Results  Component Value Date   HGBA1C 6.5 (A) 12/04/2020   HGBA1C 7.9 (A) 07/22/2020   HGBA1C 10.0 (H) 01/25/2020   Lab Results  Component Value Date   MICROALBUR 3.3 (H) 07/22/2020   LDLCALC 65 01/18/2015   CREATININE 1.13 09/28/2020   Lab Results  Component Value Date   MICRALBCREAT 2.2 07/22/2020     Lab Results  Component Value Date   CHOL 108 01/18/2015   HDL 23 (L) 01/18/2015   LDLCALC 65 01/18/2015   TRIG 100 01/18/2015   CHOLHDL 4.7 01/18/2015       Results for [name], [name] (MRN [id number]) as of 07/23/2020 14:51  Ref. Range 07/22/2020 14:05  Glucose Latest Ref Range: 65 - 99 mg/dL 341 (H)  C-Peptide Latest Ref Range: 0.80 - 3.85 ng/mL 1.27  Results for [name], [name] (MRN [id number]) as of 07/23/2020 14:51  Ref. 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 Goal A1c < 7.0 %.    A1c optimal, tolerating metformin   Hypoglycemia has improved , but his Bg today is 94 mg/dL and endorsed hypoglycemia in the high 60's, so I will reduce his basal insulin as below   C-peptide 1.27 ng/mL with a concomitant serum glucose of 177 mg/dL (92/1194)    MEDICATIONS:  Continue Metformin 500 mg BID   Decrease Lantus to 27 units daily   EDUCATION / INSTRUCTIONS:  BG monitoring instructions: Patient is instructed to check his blood sugars 1 times a day, fasting .  Call [facility] if: BG persistently < 70  . I reviewed the Rule of 15 for the treatment of hypoglycemia in detail with the patient. Literature supplied.   2) Diabetic complications:   Eye: Does not have known diabetic retinopathy.   Neuro/ Feet: Does  have known diabetic peripheral neuropathy .   Renal:  Patient does have Hx of ESRD requiring HD but is S/P transplant (2007). He is on an ACEI/ARB at present.     F/U in 6 months    Signed electronically by: Lyndle Herrlich, MD  [facility]  [facility] [address] [place] [place], [place] [address] Phone: [phone] FAX: [phone]   CC: [name], MD [address] [place] [address] Phone: [phone]  Fax: [phone]  Return to Endocrinology clinic as below: No future appointments.

## 2021-02-22 ENCOUNTER — Emergency Department (HOSPITAL_COMMUNITY): Payer: No Typology Code available for payment source

## 2021-02-22 ENCOUNTER — Encounter (HOSPITAL_COMMUNITY): Payer: Self-pay | Admitting: Family Medicine

## 2021-02-22 ENCOUNTER — Inpatient Hospital Stay (HOSPITAL_COMMUNITY)
Admission: EM | Admit: 2021-02-22 | Discharge: 2021-02-27 | DRG: 392 | Disposition: A | Discharge status: Home / Self Care | Payer: No Typology Code available for payment source | Attending: Internal Medicine | Admitting: Internal Medicine

## 2021-02-22 DIAGNOSIS — M109 Gout, unspecified: Secondary | ICD-10-CM | POA: Diagnosis present

## 2021-02-22 DIAGNOSIS — Y830 Surgical operation with transplant of whole organ as the cause of abnormal reaction of the patient, or of later complication, without mention of misadventure at the time of the procedure: Secondary | ICD-10-CM | POA: Diagnosis present

## 2021-02-22 DIAGNOSIS — Z8249 Family history of ischemic heart disease and other diseases of the circulatory system: Secondary | ICD-10-CM

## 2021-02-22 DIAGNOSIS — Z9483 Pancreas transplant status: Secondary | ICD-10-CM

## 2021-02-22 DIAGNOSIS — H548 Legal blindness, as defined in USA: Secondary | ICD-10-CM | POA: Diagnosis present

## 2021-02-22 DIAGNOSIS — Z940 Kidney transplant status: Secondary | ICD-10-CM

## 2021-02-22 DIAGNOSIS — G4730 Sleep apnea, unspecified: Secondary | ICD-10-CM | POA: Diagnosis present

## 2021-02-22 DIAGNOSIS — D6959 Other secondary thrombocytopenia: Secondary | ICD-10-CM | POA: Diagnosis present

## 2021-02-22 DIAGNOSIS — E1142 Type 2 diabetes mellitus with diabetic polyneuropathy: Secondary | ICD-10-CM

## 2021-02-22 DIAGNOSIS — T451X5A Adverse effect of antineoplastic and immunosuppressive drugs, initial encounter: Secondary | ICD-10-CM | POA: Diagnosis present

## 2021-02-22 DIAGNOSIS — Z79899 Other long term (current) drug therapy: Secondary | ICD-10-CM

## 2021-02-22 DIAGNOSIS — I10 Essential (primary) hypertension: Secondary | ICD-10-CM | POA: Diagnosis not present

## 2021-02-22 DIAGNOSIS — F4310 Post-traumatic stress disorder, unspecified: Secondary | ICD-10-CM | POA: Diagnosis present

## 2021-02-22 DIAGNOSIS — K449 Diaphragmatic hernia without obstruction or gangrene: Secondary | ICD-10-CM | POA: Diagnosis present

## 2021-02-22 DIAGNOSIS — E785 Hyperlipidemia, unspecified: Secondary | ICD-10-CM | POA: Diagnosis present

## 2021-02-22 DIAGNOSIS — R112 Nausea with vomiting, unspecified: Secondary | ICD-10-CM

## 2021-02-22 DIAGNOSIS — N179 Acute kidney failure, unspecified: Secondary | ICD-10-CM | POA: Diagnosis present

## 2021-02-22 DIAGNOSIS — Z87891 Personal history of nicotine dependence: Secondary | ICD-10-CM

## 2021-02-22 DIAGNOSIS — Z7984 Long term (current) use of oral hypoglycemic drugs: Secondary | ICD-10-CM

## 2021-02-22 DIAGNOSIS — K224 Dyskinesia of esophagus: Secondary | ICD-10-CM | POA: Diagnosis present

## 2021-02-22 DIAGNOSIS — Z8616 Personal history of COVID-19: Secondary | ICD-10-CM

## 2021-02-22 DIAGNOSIS — R1310 Dysphagia, unspecified: Secondary | ICD-10-CM

## 2021-02-22 DIAGNOSIS — K222 Esophageal obstruction: Secondary | ICD-10-CM | POA: Diagnosis not present

## 2021-02-22 DIAGNOSIS — K3184 Gastroparesis: Secondary | ICD-10-CM | POA: Diagnosis present

## 2021-02-22 DIAGNOSIS — E1042 Type 1 diabetes mellitus with diabetic polyneuropathy: Secondary | ICD-10-CM | POA: Diagnosis present

## 2021-02-22 DIAGNOSIS — E1043 Type 1 diabetes mellitus with diabetic autonomic (poly)neuropathy: Secondary | ICD-10-CM | POA: Diagnosis present

## 2021-02-22 DIAGNOSIS — T8619 Other complication of kidney transplant: Secondary | ICD-10-CM | POA: Diagnosis present

## 2021-02-22 DIAGNOSIS — E079 Disorder of thyroid, unspecified: Secondary | ICD-10-CM | POA: Diagnosis present

## 2021-02-22 DIAGNOSIS — I160 Hypertensive urgency: Secondary | ICD-10-CM | POA: Diagnosis not present

## 2021-02-22 DIAGNOSIS — Z7982 Long term (current) use of aspirin: Secondary | ICD-10-CM

## 2021-02-22 DIAGNOSIS — I739 Peripheral vascular disease, unspecified: Secondary | ICD-10-CM | POA: Diagnosis present

## 2021-02-22 DIAGNOSIS — Z20822 Contact with and (suspected) exposure to covid-19: Secondary | ICD-10-CM | POA: Diagnosis present

## 2021-02-22 DIAGNOSIS — Z981 Arthrodesis status: Secondary | ICD-10-CM

## 2021-02-22 DIAGNOSIS — Z89512 Acquired absence of left leg below knee: Secondary | ICD-10-CM

## 2021-02-22 DIAGNOSIS — E1051 Type 1 diabetes mellitus with diabetic peripheral angiopathy without gangrene: Secondary | ICD-10-CM | POA: Diagnosis present

## 2021-02-22 DIAGNOSIS — Z794 Long term (current) use of insulin: Secondary | ICD-10-CM

## 2021-02-22 DIAGNOSIS — K2100 Gastro-esophageal reflux disease with esophagitis, without bleeding: Secondary | ICD-10-CM | POA: Diagnosis present

## 2021-02-22 LAB — COMPREHENSIVE METABOLIC PANEL
ALT: 12 U/L (ref 0–44)
AST: 19 U/L (ref 15–41)
Albumin: 4.9 g/dL (ref 3.5–5.0)
Alkaline Phosphatase: 100 U/L (ref 38–126)
Anion gap: 14 (ref 5–15)
BUN: 17 mg/dL (ref 8–23)
CO2: 19 mmol/L — ABNORMAL LOW (ref 22–32)
Calcium: 9.8 mg/dL (ref 8.9–10.3)
Chloride: 106 mmol/L (ref 98–111)
Creatinine, Ser: 1.12 mg/dL (ref 0.61–1.24)
GFR, Estimated: >60 mL/min (ref >60)
Glucose, Bld: 264 mg/dL — ABNORMAL HIGH (ref 70–99)
Potassium: 4.2 mmol/L (ref 3.5–5.1)
Sodium: 139 mmol/L (ref 135–145)
Total Bilirubin: 0.7 mg/dL (ref 0.3–1.2)
Total Protein: 8.1 g/dL (ref 6.5–8.1)

## 2021-02-22 LAB — CBC WITH DIFFERENTIAL/PLATELET
Abs Immature Granulocytes: 0.08 10*3/uL — ABNORMAL HIGH (ref 0.00–0.07)
Basophils Absolute: 0 10*3/uL (ref 0.0–0.1)
Basophils Relative: 0 %
Eosinophils Absolute: 0 10*3/uL (ref 0.0–0.5)
Eosinophils Relative: 0 %
HCT: 49.1 % (ref 39.0–52.0)
Hemoglobin: 15.5 g/dL (ref 13.0–17.0)
Immature Granulocytes: 1 %
Lymphocytes Relative: 6 %
Lymphs Abs: 0.6 10*3/uL — ABNORMAL LOW (ref 0.7–4.0)
MCH: 26.5 pg (ref 26.0–34.0)
MCHC: 31.6 g/dL (ref 30.0–36.0)
MCV: 83.9 fL (ref 80.0–100.0)
Monocytes Absolute: 0.3 10*3/uL (ref 0.1–1.0)
Monocytes Relative: 3 %
Neutro Abs: 8.7 10*3/uL — ABNORMAL HIGH (ref 1.7–7.7)
Neutrophils Relative %: 90 %
Platelets: 104 10*3/uL — ABNORMAL LOW (ref 150–400)
RBC: 5.85 MIL/uL — ABNORMAL HIGH (ref 4.22–5.81)
RDW: 15 % (ref 11.5–15.5)
WBC: 9.7 10*3/uL (ref 4.0–10.5)
nRBC: 0 % (ref 0.0–0.2)

## 2021-02-22 LAB — I-STAT CHEM 8, ED
BUN: 20 mg/dL (ref 8–23)
Calcium, Ion: 1.12 mmol/L — ABNORMAL LOW (ref 1.15–1.40)
Chloride: 108 mmol/L (ref 98–111)
Creatinine, Ser: 0.9 mg/dL (ref 0.61–1.24)
Glucose, Bld: 256 mg/dL — ABNORMAL HIGH (ref 70–99)
HCT: 50 % (ref 39.0–52.0)
Hemoglobin: 17 g/dL (ref 13.0–17.0)
Potassium: 4.1 mmol/L (ref 3.5–5.1)
Sodium: 140 mmol/L (ref 135–145)
TCO2: 20 mmol/L — ABNORMAL LOW (ref 22–32)

## 2021-02-22 LAB — URINALYSIS, ROUTINE W REFLEX MICROSCOPIC
Bacteria, UA: NONE SEEN
Bilirubin Urine: NEGATIVE
Glucose, UA: >=500 mg/dL — AB
Ketones, ur: 20 mg/dL — AB
Leukocytes,Ua: NEGATIVE
Nitrite: NEGATIVE
Protein, ur: >=300 mg/dL — AB
Specific Gravity, Urine: 1.017 (ref 1.005–1.030)
pH: 5 (ref 5.0–8.0)

## 2021-02-22 LAB — I-STAT VENOUS BLOOD GAS, ED
Acid-base deficit: 4 mmol/L — ABNORMAL HIGH (ref 0.0–2.0)
Bicarbonate: 20.8 mmol/L (ref 20.0–28.0)
Calcium, Ion: 1.11 mmol/L — ABNORMAL LOW (ref 1.15–1.40)
HCT: 50 % (ref 39.0–52.0)
Hemoglobin: 17 g/dL (ref 13.0–17.0)
O2 Saturation: 94 %
Potassium: 4.3 mmol/L (ref 3.5–5.1)
Sodium: 140 mmol/L (ref 135–145)
TCO2: 22 mmol/L (ref 22–32)
pCO2, Ven: 36 mmHg — ABNORMAL LOW (ref 44.0–60.0)
pH, Ven: 7.37 (ref 7.250–7.430)
pO2, Ven: 71 mmHg — ABNORMAL HIGH (ref 32.0–45.0)

## 2021-02-22 LAB — LIPASE, BLOOD: Lipase: 19 U/L (ref 11–51)

## 2021-02-22 LAB — TROPONIN I (HIGH SENSITIVITY)
Troponin I (High Sensitivity): 16 ng/L (ref <18)
Troponin I (High Sensitivity): 17 ng/L (ref <18)

## 2021-02-22 LAB — RESP PANEL BY RT-PCR (FLU A&B, COVID) ARPGX2
Influenza A by PCR: NEGATIVE
Influenza B by PCR: NEGATIVE
SARS Coronavirus 2 by RT PCR: NEGATIVE

## 2021-02-22 LAB — LACTIC ACID, PLASMA
Lactic Acid, Venous: 2.8 mmol/L (ref 0.5–1.9)
Lactic Acid, Venous: 2.1 mmol/L (ref 0.5–1.9)

## 2021-02-22 MED ORDER — LABETALOL HCL 5 MG/ML IV SOLN
20.0000 mg | Freq: Once | INTRAVENOUS | Status: AC
  Administered 2021-02-22: 20 mg via INTRAVENOUS
  Filled 2021-02-22: qty 4

## 2021-02-22 MED ORDER — METOCLOPRAMIDE HCL 5 MG/ML IJ SOLN
10.0000 mg | Freq: Once | INTRAMUSCULAR | Status: AC
  Administered 2021-02-22: 10 mg via INTRAVENOUS
  Filled 2021-02-22: qty 2

## 2021-02-22 MED ORDER — HYDRALAZINE HCL 20 MG/ML IJ SOLN
10.0000 mg | Freq: Once | INTRAMUSCULAR | Status: AC
  Administered 2021-02-23: 10 mg via INTRAVENOUS
  Filled 2021-02-22: qty 1

## 2021-02-22 MED ORDER — PANTOPRAZOLE SODIUM 40 MG IV SOLR
40.0000 mg | Freq: Once | INTRAVENOUS | Status: AC
  Administered 2021-02-22: 40 mg via INTRAVENOUS
  Filled 2021-02-22: qty 40

## 2021-02-22 MED ORDER — FAMOTIDINE IN NACL 20-0.9 MG/50ML-% IV SOLN
20.0000 mg | Freq: Once | INTRAVENOUS | Status: AC
  Administered 2021-02-22: 20 mg via INTRAVENOUS
  Filled 2021-02-22: qty 50

## 2021-02-22 MED ORDER — LORAZEPAM 2 MG/ML IJ SOLN
1.0000 mg | Freq: Once | INTRAMUSCULAR | Status: AC
  Administered 2021-02-22: 1 mg via INTRAVENOUS
  Filled 2021-02-22: qty 1

## 2021-02-22 MED ORDER — ONDANSETRON HCL 4 MG/2ML IJ SOLN
4.0000 mg | Freq: Once | INTRAMUSCULAR | Status: AC
  Administered 2021-02-22: 4 mg via INTRAVENOUS
  Filled 2021-02-22: qty 2

## 2021-02-22 MED ORDER — SODIUM CHLORIDE 0.9 % IV BOLUS
500.0000 mL | Freq: Once | INTRAVENOUS | Status: AC
  Administered 2021-02-22: 500 mL via INTRAVENOUS

## 2021-02-22 NOTE — ED Notes (Signed)
Critical lactic results reported to Dr. Charm Barges

## 2021-02-22 NOTE — ED Triage Notes (Signed)
Pt BIB [facility] for [facility]1 week, back pain since last night, NV that began today.  EMS vitals: 220/90, 100, 115,16.

## 2021-02-22 NOTE — ED Notes (Signed)
[name] [phone] would like an update

## 2021-02-22 NOTE — H&P (Signed)
History and Physical    [name] HarbourClarence Jezek Jr. ZOX:[id number] DOB: 1951/07/16 DOA: 02/22/2021  PCP: Melida QuitterWile, Laura H, MD   Patient coming from:  Home  Chief Complaint: nausea, vomiting, chills.  HPI: [name] HarbourClarence Wallick Jr. is a 70 y.o. male with medical history significant for Pancreas and kidney transplant in 2009, HTN, DMT2, neuropathy, PVD who presents with complaint of nausea and vomiting that began this am. He reports having some low back pain last night with subjectively feeling warm and having chills. This am started to have nausea and vomiting. No blood or dark tarry emesis. Has had generalized abdominal pain. Reports felt warm and had chills last pm and this am but no fever. Has had small amount of loose stool this am but none since. Denies melena. Had similar episode in Jan 2022 that required admission but no etiology found. Pt has had a pancreas and kidney transplant. He makes urine. No urinary frequency, hesitency or dysuria. Is vaccinated for Covid-19. No known exposures to Covid and no sick contacts at home.  Lives with his wife.   ED Course:    [name] with elevated BP of 167-233/74-94 in ER. Given Labetolol in ER. Given multiple doses of antiemetics in ER but continues to have nausea and vomiting. Given IVF hydration.  Sodium 139 potassium 4.2 chloride 106 bicarb 19 glucose 250 60-64 BUN 17 creatinine 1.12 lipase 19, troponin 16, lactic acid 2.1.  WBC is 9700, hemoglobin 15.5 hematocrit 49.1, platelets 104,000.  COVID swab negative.  Influenza A and B negative  Review of Systems:  General: Reports subjective fever and chills since last pm. Denies weakness, weight loss, night sweats.  Denies dizziness.  Denies change in appetite HENT: Denies head trauma, headache, denies change in hearing, tinnitus.  Denies nasal bleeding.  Denies sore throat, sores in mouth.  Denies difficulty swallowing Eyes: Denies blurry vision, pain in eye, drainage.  Denies discoloration of eyes. Neck: Denies pain.   Denies swelling.  Denies pain with movement. Cardiovascular: Denies chest pain, palpitations.  Denies edema.  Denies orthopnea Respiratory: Denies shortness of breath, cough.  Denies wheezing.  Denies sputum production Gastrointestinal: Reports mild abdominal pain, reports nausea, vomiting, diarrhea.  Denies melena.  Denies hematemesis. Musculoskeletal: Denies limitation of movement.  Denies deformity or swelling.  Denies pain.  Denies arthralgias or myalgias. Genitourinary: Denies pelvic pain.  Denies urinary frequency or hesitancy.  Denies dysuria.  Skin: Denies rash.  Denies petechiae, purpura, ecchymosis. Neurological: Denies headache.  Denies syncope.  Denies seizure activity.  Denies weakness or paresthesia.  Denies slurred speech, drooping face.  Denies visual change. Psychiatric: Denies depression, anxiety. Denies hallucinations.  Past Medical History:  Diagnosis Date  . Anxiety   . COVID-19 virus infection 09/2020  . CPAP (continuous positive airway pressure) dependence   . Diabetes mellitus without complication (HCC)   . GERD (gastroesophageal reflux disease)   . Hypertension   . Legally blind in left eye, as [place] in [place]   . PTSD (post-traumatic stress disorder)   . PVD (peripheral vascular disease) (HCC) 01/17/2015  . Renal disorder   . Sleep apnea   . Thyroid disease     Past Surgical History:  Procedure Laterality Date  . ABDOMINAL AORTAGRAM N/A 01/21/2015   Procedure: ABDOMINAL Ronny FlurryAORTAGRAM;  Surgeon: Chuck Hinthristopher S Dickson, MD;  Location: [facility] CATH LAB;  Service: Cardiovascular;  Laterality: N/A;  . AMPUTATION Left 01/23/2015   Procedure: FIRST RAY AMPUTATION/LEFT FOOT;  Surgeon: Nadara MustardMarcus Duda V, MD;  Location: [facility] OR;  Service:  Orthopedics;  Laterality: Left;  . AMPUTATION Left 02/20/2015   Procedure: AMPUTATION BELOW KNEE;  Surgeon: Nadara Mustard, MD;  Location: [facility] OR;  Service: Orthopedics;  Laterality: Left;  . AV FISTULA PLACEMENT    . COLONOSCOPY    . COMBINED  KIDNEY-PANCREAS TRANSPLANT Left 2009  . INCISION AND DRAINAGE ABSCESS Left 01/13/2015   Procedure: INCISION AND DRAINAGE OF LEFT FOOT FIRST RAY ABCESS;  Surgeon: Kathryne Hitch, MD;  Location: [facility] OR;  Service: Orthopedics;  Laterality: Left;  . LOWER EXTREMITY ANGIOGRAM Left 01/21/2015   Procedure: LOWER EXTREMITY ANGIOGRAM;  Surgeon: Chuck Hint, MD;  Location: [facility] CATH LAB;  Service: Cardiovascular;  Laterality: Left;    Social History  reports that he has quit smoking. He has never used smokeless tobacco. He reports current alcohol use. He reports that he does not use drugs.  No Known Allergies  Family History  Problem Relation Age of Onset  . Hypertension Other      Prior to Admission medications   Medication Sig Start Date End Date Taking? Authorizing Provider  amLODipine (NORVASC) 10 MG tablet Take 10 mg by mouth daily.   Yes [provider]  ASPIRIN 81 PO Take 81 mg by mouth daily. 02/29/20  Yes [provider]  aspirin EC 81 MG tablet Take 81 mg by mouth daily.   Yes [provider]  atorvastatin (LIPITOR) 80 MG tablet Take 40 mg by mouth at bedtime.    Yes [provider]  cyclobenzaprine (FLEXERIL) 5 MG tablet Take 1 tablet (5 mg total) by mouth 2 (two) times daily as needed for muscle spasms. 01/30/20  Yes Fawze, Mina A, PA-C  ergocalciferol (VITAMIN D2) 1.25 MG (50000 UT) capsule Take 50,000 Units by mouth every Wednesday. 05/22/15  Yes [provider]  furosemide (LASIX) 20 MG tablet Take 1 tablet (20 mg total) by mouth daily. 07/28/17  Yes Osvaldo Shipper, MD  insulin glargine (LANTUS SOLOSTAR) 100 UNIT/ML Solostar Pen Inject 28 Units into the skin daily. 12/04/20  Yes Shamleffer, Konrad Dolores, MD  labetalol (NORMODYNE) 100 MG tablet Take 2 tablets (200 mg total) by mouth 2 (two) times daily. 07/28/17  Yes Osvaldo Shipper, MD  lisinopril (PRINIVIL,ZESTRIL) 10 MG tablet Take 1 tablet (10 mg total) by mouth daily.  01/07/18  Yes Burnadette Pop, MD  losartan (COZAAR) 50 MG tablet Take 50 mg by mouth daily. 02/19/20  Yes [provider]  metFORMIN (GLUCOPHAGE-XR) 500 MG 24 hr tablet Take 1 tablet (500 mg total) by mouth 2 (two) times daily. 12/04/20  Yes Shamleffer, Konrad Dolores, MD  mycophenolate (CELLCEPT) 250 MG capsule Take 1,000 mg by mouth 2 (two) times daily.   Yes [provider]  pantoprazole (PROTONIX) 40 MG tablet Take 1 tablet (40 mg total) by mouth daily. 07/28/17  Yes Osvaldo Shipper, MD  potassium chloride 20 MEQ TBCR Take 20 mEq by mouth daily. 01/07/18  Yes Burnadette Pop, MD  sildenafil (VIAGRA) 50 MG tablet Take 50 mg by mouth as needed. 02/29/20  Yes [provider]  tacrolimus (PROGRAF) 5 MG capsule Take 5 mg by mouth 2 (two) times daily.   Yes [provider]  traZODone (DESYREL) 50 MG tablet Take 50 mg by mouth at bedtime as needed for sleep.   Yes [provider]  Insulin Pen Needle 32G X 4 MM MISC 1 Device by Does not apply route as directed. 12/04/20   Shamleffer, Konrad Dolores, MD  metoCLOPramide (REGLAN) 5 MG tablet Take 1  tablet (5 mg total) by mouth 3 (three) times daily for 3 days. Patient not taking: Reported on 02/22/2021 10/20/17 01/05/26  Leatha Gilding, MD  ondansetron (ZOFRAN ODT) 8 MG disintegrating tablet Take 1 tablet (8 mg total) by mouth every 8 (eight) hours as needed for nausea. Patient not taking: Reported on 12/04/2020 09/28/20   Derwood Kaplan, MD  ondansetron (ZOFRAN) 4 MG tablet Take 1 tablet (4 mg total) by mouth every 8 (eight) hours as needed for nausea or vomiting. Patient not taking: Reported on 12/04/2020 04/24/19   Devoria Albe, MD  silver sulfADIAZINE (SILVADENE) 1 % cream Apply 1 application topically daily. Patient not taking: Reported on 02/22/2021 06/12/19   Eustace Moore, MD    Physical Exam: Vitals:   02/22/21 2006 02/22/21 2015 02/22/21 2115 02/22/21 2253  BP:  (!) 197/87 (!) 209/89 (!) 187/74  Pulse:   (!) 101 (!) 106 (!) 107  Resp:  18 16 16   Temp: 99 F (37.2 C)     TempSrc: Oral     SpO2:  98% 98% 99%  Weight:      Height:        Constitutional: NAD, calm,ill appearing male Vitals:   02/22/21 2006 02/22/21 2015 02/22/21 2115 02/22/21 2253  BP:  (!) 197/87 (!) 209/89 (!) 187/74  Pulse:  (!) 101 (!) 106 (!) 107  Resp:  18 16 16   Temp: 99 F (37.2 C)     TempSrc: Oral     SpO2:  98% 98% 99%  Weight:      Height:       General: WDWN, Alert and oriented x3.  Eyes: EOMI, PERRL, conjunctivae normal.  Sclera nonicteric HENT:  [place]/AT, external ears normal.  Nares patent without epistasis.  Mucous membranes are dry. Posterior pharynx clear of any exudate or lesions.  Neck: Soft, normal range of motion, supple, no masses, no thyromegaly.  Trachea midline Respiratory: clear to auscultation bilaterally, no wheezing, no crackles. Normal respiratory effort. No accessory muscle use.  Cardiovascular: Tachycardia, no murmurs / rubs / gallops. No extremity edema. Abdomen: Soft, mild tenderness, nondistended, no rebound or guarding.  No masses palpated. Bowel sounds hyperactive. Healed midline incision. Musculoskeletal: FROM. no cyanosis. No joint deformity upper and lower extremities. Normal muscle tone. Left BKA. Fistula in right bicep with thrill.  Skin: Warm, dry, intact no rashes, lesions, ulcers. No induration Neurologic: CN 2-12 grossly intact.  Normal speech.  Sensation intact. Strength 5/5 in all extremities.   Psychiatric: Normal judgment and insight.  Normal mood.    Labs on Admission: I have personally reviewed following labs and imaging studies  CBC: Recent Labs  Lab 02/22/21 1742 02/22/21 1811  WBC 9.7  --   NEUTROABS 8.7*  --   HGB 15.5 17.0  17.0  HCT 49.1 50.0  50.0  MCV 83.9  --   PLT 104*  --     Basic Metabolic Panel: Recent Labs  Lab 02/22/21 1742 02/22/21 1811  NA 139 140  140  K 4.2 4.3  4.1  CL 106 108  CO2 19*  --   GLUCOSE 264* 256*  BUN 17  20  CREATININE 1.12 0.90  CALCIUM 9.8  --     GFR: Estimated Creatinine Clearance: 79.3 mL/min (by C-G formula based on SCr of 0.9 mg/dL).  Liver Function Tests: Recent Labs  Lab 02/22/21 1742  AST 19  ALT 12  ALKPHOS 100  BILITOT 0.7  PROT 8.1  ALBUMIN 4.9  Urine analysis:    Component Value Date/Time   COLORURINE YELLOW 02/22/2021 1652   APPEARANCEUR CLEAR 02/22/2021 1652   LABSPEC 1.017 02/22/2021 1652   PHURINE 5.0 02/22/2021 1652   GLUCOSEU >=500 (A) 02/22/2021 1652   HGBUR SMALL (A) 02/22/2021 1652   BILIRUBINUR NEGATIVE 02/22/2021 1652   KETONESUR 20 (A) 02/22/2021 1652   PROTEINUR >=300 (A) 02/22/2021 1652   UROBILINOGEN 1.0 09/29/2013 2339   NITRITE NEGATIVE 02/22/2021 1652   LEUKOCYTESUR NEGATIVE 02/22/2021 1652    Radiological Exams on Admission: CT Abdomen Pelvis Wo Contrast  Result Date: 02/22/2021 CLINICAL DATA:  Acute abdominal pain EXAM: CT ABDOMEN AND PELVIS WITHOUT CONTRAST TECHNIQUE: Multidetector CT imaging of the abdomen and pelvis was performed following the standard protocol without IV contrast. COMPARISON:  01/30/2020 FINDINGS: Lower chest: No acute abnormality. Hepatobiliary: No focal liver abnormality is seen. No gallstones, gallbladder wall thickening, or biliary dilatation. Pancreas: Unremarkable. No pancreatic ductal dilatation or surrounding inflammatory changes. Known pancreatic transplant is not well appreciated on this noncontrast study. Spleen: Normal in size without focal abnormality. Adrenals/Urinary Tract: Adrenal glands are within normal limits. Kidneys are atrophic bilaterally but stable. No obstructive changes are seen. Transplant kidney is noted in the left hemipelvis without obstructive change. No renal calculi are noted. Bladder is partially distended. Stomach/Bowel: Colon shows no obstructive or inflammatory changes. The appendix is within normal limits. No small bowel or gastric abnormality is seen. Vascular/Lymphatic: Aortic  atherosclerosis. No enlarged abdominal or pelvic lymph nodes. Reproductive: Prostate is unremarkable. Other: No abdominal wall hernia or abnormality. No abdominopelvic ascites. Musculoskeletal: No acute or significant osseous findings. IMPRESSION: Status post kidney transplant without obstructive change. The native kidneys are atrophic. Known pancreas transplant is not well appreciated on this noncontrast study. No other focal abnormality is noted. Electronically Signed   By: Alcide Clever M.D.   On: 02/22/2021 22:49   CT Head Wo Contrast  Result Date: 02/22/2021 CLINICAL DATA:  70 year old male with headache. EXAM: CT HEAD WITHOUT CONTRAST TECHNIQUE: Contiguous axial images were obtained from the base of the skull through the vertex without intravenous contrast. COMPARISON:  Head CT dated 06/26/2020. FINDINGS: Brain: Mild age-related atrophy and chronic microvascular ischemic changes. There is no acute intracranial hemorrhage. No mass effect midline shift. No extra-axial fluid collection. Vascular: Similar appearance of vascular calcifications. Skull: Normal. Negative for fracture or focal lesion. Sinuses/Orbits: No acute finding. Other: None IMPRESSION: 1. No acute intracranial pathology. 2. Mild age-related atrophy and chronic microvascular ischemic changes. Electronically Signed   By: Elgie Collard M.D.   On: 02/22/2021 22:43   DG Chest Port 1 View  Result Date: 02/22/2021 CLINICAL DATA:  Cough. EXAM: PORTABLE CHEST 1 VIEW COMPARISON:  June 26, 2020 FINDINGS: The heart size and mediastinal contours are within normal limits. Both lungs are clear. The visualized skeletal structures are unremarkable. IMPRESSION: No active disease. Electronically Signed   By: Ted Mcalpine M.D.   On: 02/22/2021 17:56    EKG: Independently reviewed.  EKG shows ectopic atrial tachycardia with heart rate of 1 10-1 18 with occasional PVCs.  Inferior nonspecific ST changes.  No acute ST elevation or depression.   QTc 468  Assessment/Plan Principal Problem:   Hypertensive urgency [name] is placed on medical telemetry for observation.  Given Hydralazine now IV to help lower BP as currently 206/88.  Lopressor IV every 6 hour as needed overnight for SBP over 165.  Check serial troponin level. Initial level is negative at 16 Monitor BP closely.  Active Problems:   Intractable nausea and vomiting Antiemetic provided  IVF hydration with NS at 100 ml/hr overnight. Check serial troponin levels CT abdomen and pelvis negative for acute pathology Covid negative. No known exposures to Covid.    Peripheral vascular disease  Chronic    Type 2 diabetes mellitus with diabetic polyneuropathy, with long-term current use of insulin Monitor blood sugar q 6 hour while NPO overnight. SSI provided for glycemic control. Hold basal insulin and metformin while NPO.    Kidney transplant recipient   Pancreas transplant status  Chronic. Stable.      DVT prophylaxis: Lovenox for DVT prophylaxis.  Code Status:   Full code  Family Communication:  Diagnosis and plan discussed with patient.  Patient verbalized understanding agrees with plan.  Further recommendations to follow as clinical indicated Disposition Plan:   Patient is from:  Home  Anticipated DC to:  Home  Anticipated DC date:  Anticipate less than two midnight stay unless condition changes  Anticipated DC barriers: No barriers to discharge identified at this time  Admission status:  Observation   Carlton Adam MD [facility]  How to contact the [facility] Attending or Consulting provider 7A - 7P or covering provider during after hours 7P -7A, for this patient?   1. Check the care team in [facility] and look for a) attending/consulting TR[facility] provider listed and b) the [facility] team listed 2. Log into [email or web address] and use 's universal password to access. If you do not have the password, please contact the hospital operator. 3. Locate the [facility]  provider you are looking for under [facility] and page to a number that you can be directly reached. 4. If you still have difficulty reaching the provider, please page the [facility] (Director on Call) for the Hospitalists listed on amion for assistance.  02/22/2021, 11:34 PM

## 2021-02-22 NOTE — ED Provider Notes (Signed)
Diarrhea [facility] EMERGENCY DEPARTMENT Provider Note   CSN: [id number] Arrival date & time: 02/22/21  1621     History No chief complaint on file.   [name]. is a 70 y.o. male.  He has a history of liver and kidney transplant remote, diabetes, hypertension.  He started with back pain last evening and EMS came out to evaluate.  He has had chills along with feeling hot and cold, nausea and vomiting since early this morning.  No obvious blood from above or below.  Generalized abdominal pain.  Little bit of cough..  Some diarrhea.  Has had 2 COVID shots but has not boosted.  Makes urine and has no urinary symptoms.  The history is provided by the patient and the EMS personnel.  Emesis Severity:  Severe Timing:  Intermittent Quality:  Unable to specify Progression:  Unchanged Chronicity:  New Relieved by:  Nothing Worsened by:  Nothing Ineffective treatments:  None tried Associated symptoms: abdominal pain, chills, cough, diarrhea, fever and myalgias   Associated symptoms: no headaches and no sore throat   Risk factors: prior abdominal surgery        Past Medical History:  Diagnosis Date  . Anxiety   . COVID-19 virus infection 09/2020  . CPAP (continuous positive airway pressure) dependence   . Diabetes mellitus without complication (HCC)   . GERD (gastroesophageal reflux disease)   . Hypertension   . Legally blind in left eye, as defined in [place]   . PTSD (post-traumatic stress disorder)   . PVD (peripheral vascular disease) (HCC) 01/17/2015  . Renal disorder   . Sleep apnea   . Thyroid disease     Patient Active Problem List   Diagnosis Date Noted  . Hypertension   . COVID-19 virus infection 09/2020  . History of left below knee amputation (HCC) 07/22/2020  . Type 2 diabetes mellitus with diabetic polyneuropathy, with long-term current use of insulin (HCC) 07/22/2020  . 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 Dyslipidemia 07/22/2020  . DKA (diabetic ketoacidoses) 01/25/2020  . OSA on CPAP 09/13/2018  . Intractable nausea and vomiting 09/12/2018  . Kidney transplant recipient 09/12/2018  . DKA, type 1 (HCC) 01/05/2018  . Nausea and vomiting 10/19/2017  . Nausea & vomiting 10/18/2017  . Hematemesis 07/24/2017  . Hypertensive urgency 07/24/2017  . Acute GI bleeding 07/24/2017  . 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 S/P BKA (below knee amputation) unilateral (HCC) 02/20/2015  . Ischemic ulcer of toe (HCC)   . Peripheral vascular disease (HCC)   . PVD (peripheral vascular disease) (HCC) 01/17/2015  . Cellulitis and abscess of foot 01/13/2015  . Cellulitis and abscess of foot excluding toe 01/13/2015  . Diabetes mellitus without complication (HCC) 01/13/2015  . Leukocytosis 01/13/2015  . Diabetes mellitus with peripheral vascular disease (HCC) 12/19/2007  . DYSLIPIDEMIA 12/19/2007  . ANEMIA 12/19/2007  . ANXIETY 12/19/2007  . Essential hypertension 12/19/2007  . TRANSIENT ISCHEMIC ATTACK 12/19/2007  . PERIPHERAL VASCULAR DISEASE 12/19/2007  . RENAL FAILURE 12/19/2007    Past Surgical History:  Procedure Laterality Date  . ABDOMINAL AORTAGRAM N/A 01/21/2015   Procedure: ABDOMINAL Ronny Flurry;  Surgeon: Chuck Hint, MD;  Location: [facility] CATH LAB;  Service: Cardiovascular;  Laterality: N/A;  . AMPUTATION Left 01/23/2015   Procedure: FIRST RAY AMPUTATION/LEFT FOOT;  Surgeon: Nadara Mustard, MD;  Location: [facility] OR;  Service: Orthopedics;  Laterality: Left;  . AMPUTATION  Left 02/20/2015   Procedure: AMPUTATION BELOW KNEE;  Surgeon: Nadara Mustard, MD;  Location: [facility] OR;  Service: Orthopedics;  Laterality: Left;  . AV FISTULA PLACEMENT    . COLONOSCOPY    . COMBINED KIDNEY-PANCREAS TRANSPLANT Left 2009  . INCISION AND DRAINAGE ABSCESS Left 01/13/2015   Procedure: INCISION AND DRAINAGE OF LEFT FOOT FIRST RAY ABCESS;  Surgeon: Kathryne Hitch, MD;   Location: [facility] OR;  Service: Orthopedics;  Laterality: Left;  . LOWER EXTREMITY ANGIOGRAM Left 01/21/2015   Procedure: LOWER EXTREMITY ANGIOGRAM;  Surgeon: Chuck Hint, MD;  Location: [facility] CATH LAB;  Service: Cardiovascular;  Laterality: Left;       Family History  Problem Relation Age of Onset  . Hypertension Other     Social History   Tobacco Use  . Smoking status: Former Games developer  . Smokeless tobacco: Never Used  . Tobacco comment: quit i [id number]  Substance Use Topics  . Alcohol use: Yes    Comment: 1 beer rarely   . Drug use: No    Home Medications Prior to Admission medications   Medication Sig Start Date End Date Taking? Authorizing Provider  amLODipine (NORVASC) 10 MG tablet Take 10 mg by mouth daily.    [provider]  aspirin EC 81 MG tablet Take 81 mg by mouth daily.    [provider]  atorvastatin (LIPITOR) 80 MG tablet Take 40 mg by mouth at bedtime.     [provider]  cyclobenzaprine (FLEXERIL) 5 MG tablet Take 1 tablet (5 mg total) by mouth 2 (two) times daily as needed for muscle spasms. 01/30/20   Fawze, Mina A, PA-C  furosemide (LASIX) 20 MG tablet Take 1 tablet (20 mg total) by mouth daily. 07/28/17   Osvaldo Shipper, MD  insulin glargine (LANTUS SOLOSTAR) 100 UNIT/ML Solostar Pen Inject 28 Units into the skin daily. 12/04/20   Shamleffer, Konrad Dolores, MD  Insulin Pen Needle 32G X 4 MM MISC 1 Device by Does not apply route as directed. 12/04/20   Shamleffer, Konrad Dolores, MD  labetalol (NORMODYNE) 100 MG tablet Take 2 tablets (200 mg total) by mouth 2 (two) times daily. 07/28/17   Osvaldo Shipper, MD  lisinopril (PRINIVIL,ZESTRIL) 10 MG tablet Take 1 tablet (10 mg total) by mouth daily. 01/07/18   Burnadette Pop, MD  metFORMIN (GLUCOPHAGE-XR) 500 MG 24 hr tablet Take 1 tablet (500 mg total) by mouth 2 (two) times daily. 12/04/20   Shamleffer, Konrad Dolores, MD  metoCLOPramide (REGLAN) 5 MG tablet Take 1 tablet (5 mg total) by  mouth 3 (three) times daily for 3 days. 10/20/17 01/05/26  Leatha Gilding, MD  mycophenolate (CELLCEPT) 250 MG capsule Take 1,000 mg by mouth 2 (two) times daily.    [provider]  ondansetron (ZOFRAN ODT) 8 MG disintegrating tablet Take 1 tablet (8 mg total) by mouth every 8 (eight) hours as needed for nausea. Patient not taking: Reported on 12/04/2020 09/28/20   Derwood Kaplan, MD  ondansetron (ZOFRAN) 4 MG tablet Take 1 tablet (4 mg total) by mouth every 8 (eight) hours as needed for nausea or vomiting. Patient not taking: Reported on 12/04/2020 04/24/19   Devoria Albe, MD  pantoprazole (PROTONIX) 40 MG tablet Take 1 tablet (40 mg total) by mouth daily. 07/28/17   Osvaldo Shipper, MD  potassium chloride 20 MEQ TBCR Take 20 mEq by mouth daily. 01/07/18   Burnadette Pop, MD  silver sulfADIAZINE (SILVADENE) 1 % cream Apply 1 application topically daily. 06/12/19  Eustace MooreNelson, Yvonne Sue, MD  tacrolimus (PROGRAF) 5 MG capsule Take 5 mg by mouth 2 (two) times daily.    [provider]  traZODone (DESYREL) 50 MG tablet Take 50 mg by mouth at bedtime as needed for sleep.    [provider]    Allergies    Patient has no known allergies.  Review of Systems   Review of Systems  Constitutional: Positive for chills and fever.  HENT: Negative for sore throat.   Eyes: Negative for visual disturbance.  Respiratory: Positive for cough. Negative for shortness of breath.   Cardiovascular: Negative for chest pain.  Gastrointestinal: Positive for abdominal pain, diarrhea and vomiting.  Genitourinary: Negative for dysuria.  Musculoskeletal: Positive for myalgias.  Skin: Negative for rash.  Neurological: Negative for headaches.    Physical Exam Updated Vital Signs BP (!) 229/89 (BP Location: Left Arm)   Pulse (!) 115   Temp 98 F (36.7 C) (Oral)   Resp 18   SpO2 95%   Physical Exam Vitals and nursing note reviewed.  Constitutional:      Appearance: He is well-developed. He is  ill-appearing.  HENT:     Head: Normocephalic and atraumatic.  Eyes:     Conjunctiva/sclera: Conjunctivae normal.  Cardiovascular:     Rate and Rhythm: Regular rhythm. Tachycardia present.     Heart sounds: No murmur heard.   Pulmonary:     Effort: Pulmonary effort is normal. No respiratory distress.     Breath sounds: Normal breath sounds.  Abdominal:     Palpations: Abdomen is soft.     Tenderness: There is abdominal tenderness. There is no guarding or rebound.  Musculoskeletal:        General: Normal range of motion.     Cervical back: Neck supple.     Comments: Left BKA  Skin:    General: Skin is warm and dry.  Neurological:     General: No focal deficit present.     Mental Status: He is alert.     ED Results / Procedures / Treatments   Labs (all labs ordered are listed, but only abnormal results are displayed) Labs Reviewed  COMPREHENSIVE METABOLIC PANEL - Abnormal; Notable for the following components:      Result Value   CO2 19 (*)    Glucose, Bld 264 (*)    All other components within normal limits  CBC WITH DIFFERENTIAL/PLATELET - Abnormal; Notable for the following components:   RBC 5.85 (*)    Platelets 104 (*)    Neutro Abs 8.7 (*)    Lymphs Abs 0.6 (*)    Abs Immature Granulocytes 0.08 (*)    All other components within normal limits  URINALYSIS, ROUTINE W REFLEX MICROSCOPIC - Abnormal; Notable for the following components:   Glucose, UA >=500 (*)    Hgb urine dipstick SMALL (*)    Ketones, ur 20 (*)    Protein, ur >=300 (*)    All other components within normal limits  LACTIC ACID, PLASMA - Abnormal; Notable for the following components:   Lactic Acid, Venous 2.8 (*)    All other components within normal limits  LACTIC ACID, PLASMA - Abnormal; Notable for the following components:   Lactic Acid, Venous 2.1 (*)    All other components within normal limits  I-STAT VENOUS BLOOD GAS, ED - Abnormal; Notable for the following components:   pCO2, Ven  36.0 (*)    pO2, Ven 71.0 (*)    Acid-base deficit 4.0 (*)  Calcium, Ion 1.11 (*)    All other components within normal limits  I-STAT CHEM 8, ED - Abnormal; Notable for the following components:   Glucose, Bld 256 (*)    Calcium, Ion 1.12 (*)    TCO2 20 (*)    All other components within normal limits  RESP PANEL BY RT-PCR (FLU A&B, COVID) ARPGX2  CULTURE, BLOOD (ROUTINE X 2)  CULTURE, BLOOD (ROUTINE X 2)  LIPASE, BLOOD  TACROLIMUS LEVEL  TROPONIN I (HIGH SENSITIVITY)  TROPONIN I (HIGH SENSITIVITY)    EKG EKG Interpretation  Date/Time:  Saturday Feb 22 2021 18:01:38 EDT Ventricular Rate:  117 PR Interval:  157 QRS Duration: 87 QT Interval:  335 QTC Calculation: 468 R Axis:   93 Text Interpretation: Ectopic atrial tachycardia, unifocal Ventricular premature complex Right axis deviation Probable anteroseptal infarct, old Abnormal T, consider ischemia, inferior leads inferior t wave inversions new from prior 9/21 Confirmed by Meridee Score [phone]) on 02/22/2021 6:08:21 PM   Radiology DG Chest Port 1 View  Result Date: 02/22/2021 CLINICAL DATA:  Cough. EXAM: PORTABLE CHEST 1 VIEW COMPARISON:  June 26, 2020 FINDINGS: The heart size and mediastinal contours are within normal limits. Both lungs are clear. The visualized skeletal structures are unremarkable. IMPRESSION: No active disease. Electronically Signed   By: Ted Mcalpine M.D.   On: 02/22/2021 17:56    Procedures .Critical Care Performed by: Terrilee Files, MD Authorized by: Terrilee Files, MD   Critical care provider statement:    Critical care time (minutes):  45   Critical care time was exclusive of:  Separately billable procedures and treating other patients   Critical care was necessary to treat or prevent imminent or life-threatening deterioration of the following conditions:  Circulatory failure   Critical care was time spent personally by me on the following activities:  Discussions with  consultants, evaluation of patient's response to treatment, examination of patient, ordering and performing treatments and interventions, ordering and review of laboratory studies, ordering and review of radiographic studies, pulse oximetry, re-evaluation of patient's condition, obtaining history from patient or surrogate, review of old charts and development of treatment plan with patient or surrogate     Medications Ordered in ED Medications  sodium chloride 0.9 % bolus 500 mL (has no administration in time range)  sodium chloride 0.9 % bolus 500 mL (0 mLs Intravenous Stopped 02/22/21 2008)  ondansetron (ZOFRAN) injection 4 mg (4 mg Intravenous Given 02/22/21 1743)  famotidine (PEPCID) IVPB 20 mg premix (0 mg Intravenous Stopped 02/22/21 2008)  metoCLOPramide (REGLAN) injection 10 mg (10 mg Intravenous Given 02/22/21 1902)  LORazepam (ATIVAN) injection 1 mg (1 mg Intravenous Given 02/22/21 1902)  labetalol (NORMODYNE) injection 20 mg (20 mg Intravenous Given 02/22/21 1912)  pantoprazole (PROTONIX) injection 40 mg (40 mg Intravenous Given 02/22/21 1911)    ED Course  I have reviewed the triage vital signs and the nursing notes.  Pertinent labs & imaging results that were available during my care of the patient were reviewed by me and considered in my medical decision making (see chart for details).  Clinical Course as of 02/22/21 2228  Sat Feb 22, 2021  1754 Chest x-ray without any definite infiltrates.  Awaiting radiology reading. [MB]  1824 Patient's lactate elevated at 2.8.  He has IV fluids infusing.  I am not sure this represents sepsis as he is got no fever or white count.  He was having a lot of retching and it may be related to that. [  MB]  1935 Labs showing his bicarb down and his glucose up but his gap is normal.  pH is also normal.  Do not think this is DKA at this point.  Blood pressure markedly elevated in was having the intractable vomiting so put him in for CT head and CT abdomen and  pelvis without contrast. [MB]  1936 Labetalol seems to have helped his tachycardia and his hypertension.  Unclear of his compliance with his medications. [MB]  2033 Patient's care is signed out to PA Bucoda to follow-up on CT imaging.  His symptoms seem better controlled currently.  If no acute findings on imaging can reassess to see if appropriate for discharge.  Likely need to be admitted hospital for further management of his symptoms. [MB]    Clinical Course User Index [MB] Terrilee Files, MD   MDM Rules/Calculators/A&P                         This patient complains of nausea vomiting, back pain, inability to take p.o. medications; this involves an extensive number of treatment Options and is a complaint that carries with it a high risk of complications and Morbidity. The differential includes obstruction, gastroparesis, hypertensive urgency, head bleed, metabolic derangement, ACS, colitis, diverticulitis, DKA  I ordered, reviewed and interpreted labs, which included CBC with normal white count, normal hemoglobin, chemistries with low bicarb elevated glucose but normal gap, VBG with normal pH, lactate elevated and slowly clearing, COVID testing negative, blood cultures pending at time of signout, troponin not significantly elevated I ordered medication IV fluids and Zofran Ativan and Reglan with improvement in patient's symptoms.  IV labetalol with improvement in patient's blood pressure. I ordered imaging studies which included chest x-ray head CT and abdominal CT and I independently    visualized and interpreted imaging which showed chest x-ray with no acute findings.  CT is pending at time of signout. Additional history obtained from EMS Previous records obtained and reviewed in epic including prior admissions for hypertensive urgency and intractable nausea vomiting  Critical Interventions: Initiation of IV medication for control patient's blood pressure.  After the interventions  stated above, I reevaluated the patient and found patient to be symptomatically improved.  Blood pressure still remains quite high.  His care is signed out to oncoming provider Dr. Roxan Hockey to follow-up on results of CTs.  Anticipate may need admission although possible discharge if no acute findings and symptom improvement.   Final Clinical Impression(s) / ED Diagnoses Final diagnoses:  Nausea and vomiting, intractability of vomiting not specified, unspecified vomiting type  Primary hypertension    Rx / DC Orders ED Discharge Orders    None       Terrilee Files, MD 02/22/21 2233

## 2021-02-22 NOTE — ED Provider Notes (Signed)
Care assumed at shift change from Dr. Charm Barges.  See his full note for HPI and work-up.  Briefly, patient presenting for intractable nausea vomiting and hypertension.  Has history of similar visit, and was last in January 2022.  He has history of kidney and pancreas transplant, does make urine at baseline.  Will likely need to come in for intractable nausea vomiting.  Is getting some improvement with Ativan and Reglan though patient continues to have nausea after medications.  He is also hypertensive, given labetalol.  CT head and abdomen pelvis are ordered to evaluate for any acute pathology. Physical Exam  BP (!) 187/74   Pulse (!) 107   Temp 99 F (37.2 C) (Oral)   Resp 16   Ht 5\' 7"  (1.702 m)   Wt 79.4 kg   SpO2 99%   BMI 27.41 kg/m   Physical Exam Vitals and nursing note reviewed.  Constitutional:      Appearance: He is well-developed.  HENT:     Head: Normocephalic and atraumatic.  Cardiovascular:     Rate and Rhythm: Tachycardia present.  Pulmonary:     Effort: Pulmonary effort is normal.  Abdominal:     Palpations: Abdomen is soft.  Skin:    General: Skin is warm.  Neurological:     Mental Status: He is alert.  Psychiatric:        Behavior: Behavior normal.    Results for orders placed or performed during the hospital encounter of 02/22/21  Resp Panel by RT-PCR (Flu A&B, Covid) Nasopharyngeal Swab   Specimen: Nasopharyngeal Swab; Nasopharyngeal(NP) swabs in vial transport medium  Result Value Ref Range   SARS Coronavirus 2 by RT PCR NEGATIVE NEGATIVE   Influenza A by PCR NEGATIVE NEGATIVE   Influenza B by PCR NEGATIVE NEGATIVE  Comprehensive metabolic panel  Result Value Ref Range   Sodium 139 135 - 145 mmol/L   Potassium 4.2 3.5 - 5.1 mmol/L   Chloride 106 98 - 111 mmol/L   CO2 19 (L) 22 - 32 mmol/L   Glucose, Bld 264 (H) 70 - 99 mg/dL   BUN 17 8 - 23 mg/dL   Creatinine, Ser 02/24/21 0.61 - 1.24 mg/dL   Calcium 9.8 8.9 - 1.61 mg/dL   Total Protein 8.1 6.5 - 8.1  g/dL   Albumin 4.9 3.5 - 5.0 g/dL   AST 19 15 - 41 U/L   ALT 12 0 - 44 U/L   Alkaline Phosphatase 100 38 - 126 U/L   Total Bilirubin 0.7 0.3 - 1.2 mg/dL   GFR, Estimated 09.6 >04 mL/min   Anion gap 14 5 - 15  Lipase, blood  Result Value Ref Range   Lipase 19 11 - 51 U/L  CBC with Differential  Result Value Ref Range   WBC 9.7 4.0 - 10.5 K/uL   RBC 5.85 (H) 4.22 - 5.81 MIL/uL   Hemoglobin 15.5 13.0 - 17.0 g/dL   HCT >54 09.8 - 11.9 %   MCV 83.9 80.0 - 100.0 fL   MCH 26.5 26.0 - 34.0 pg   MCHC 31.6 30.0 - 36.0 g/dL   RDW 14.7 82.9 - 56.2 %   Platelets 104 (L) 150 - 400 K/uL   nRBC 0.0 0.0 - 0.2 %   Neutrophils Relative % 90 %   Neutro Abs 8.7 (H) 1.7 - 7.7 K/uL   Lymphocytes Relative 6 %   Lymphs Abs 0.6 (L) 0.7 - 4.0 K/uL   Monocytes Relative 3 %   Monocytes  Absolute 0.3 0.1 - 1.0 K/uL   Eosinophils Relative 0 %   Eosinophils Absolute 0.0 0.0 - 0.5 K/uL   Basophils Relative 0 %   Basophils Absolute 0.0 0.0 - 0.1 K/uL   Immature Granulocytes 1 %   Abs Immature Granulocytes 0.08 (H) 0.00 - 0.07 K/uL  Urinalysis, Routine w reflex microscopic Urine, Clean Catch  Result Value Ref Range   Color, Urine YELLOW YELLOW   APPearance CLEAR CLEAR   Specific Gravity, Urine 1.017 1.005 - 1.030   pH 5.0 5.0 - 8.0   Glucose, UA >=500 (A) NEGATIVE mg/dL   Hgb urine dipstick SMALL (A) NEGATIVE   Bilirubin Urine NEGATIVE NEGATIVE   Ketones, ur 20 (A) NEGATIVE mg/dL   Protein, ur >=440 (A) NEGATIVE mg/dL   Nitrite NEGATIVE NEGATIVE   Leukocytes,Ua NEGATIVE NEGATIVE   RBC / HPF 0-5 0 - 5 RBC/hpf   WBC, UA 0-5 0 - 5 WBC/hpf   Bacteria, UA NONE SEEN NONE SEEN   Squamous Epithelial / LPF 0-5 0 - 5   Mucus PRESENT   Lactic acid, plasma  Result Value Ref Range   Lactic Acid, Venous 2.8 (HH) 0.5 - 1.9 mmol/L  Lactic acid, plasma  Result Value Ref Range   Lactic Acid, Venous 2.1 (HH) 0.5 - 1.9 mmol/L  I-Stat venous blood gas, ED  Result Value Ref Range   pH, Ven 7.370 7.250 - 7.430    pCO2, Ven 36.0 (L) 44.0 - 60.0 mmHg   pO2, Ven 71.0 (H) 32.0 - 45.0 mmHg   Bicarbonate 20.8 20.0 - 28.0 mmol/L   TCO2 22 22 - 32 mmol/L   O2 Saturation 94.0 %   Acid-base deficit 4.0 (H) 0.0 - 2.0 mmol/L   Sodium 140 135 - 145 mmol/L   Potassium 4.3 3.5 - 5.1 mmol/L   Calcium, Ion 1.11 (L) 1.15 - 1.40 mmol/L   HCT 50.0 39.0 - 52.0 %   Hemoglobin 17.0 13.0 - 17.0 g/dL   Sample type VENOUS   I-stat chem 8, ED (not at [facility] or [facility])  Result Value Ref Range   Sodium 140 135 - 145 mmol/L   Potassium 4.1 3.5 - 5.1 mmol/L   Chloride 108 98 - 111 mmol/L   BUN 20 8 - 23 mg/dL   Creatinine, Ser 1.02 0.61 - 1.24 mg/dL   Glucose, Bld 725 (H) 70 - 99 mg/dL   Calcium, Ion 3.66 (L) 1.15 - 1.40 mmol/L   TCO2 20 (L) 22 - 32 mmol/L   Hemoglobin 17.0 13.0 - 17.0 g/dL   HCT 44.0 34.7 - 42.5 %  Troponin I (High Sensitivity)  Result Value Ref Range   Troponin I (High Sensitivity) 16 <18 ng/L   CT Abdomen Pelvis Wo Contrast  Result Date: 02/22/2021 CLINICAL DATA:  Acute abdominal pain EXAM: CT ABDOMEN AND PELVIS WITHOUT CONTRAST TECHNIQUE: Multidetector CT imaging of the abdomen and pelvis was performed following the standard protocol without IV contrast. COMPARISON:  01/30/2020 FINDINGS: Lower chest: No acute abnormality. Hepatobiliary: No focal liver abnormality is seen. No gallstones, gallbladder wall thickening, or biliary dilatation. Pancreas: Unremarkable. No pancreatic ductal dilatation or surrounding inflammatory changes. Known pancreatic transplant is not well appreciated on this noncontrast study. Spleen: Normal in size without focal abnormality. Adrenals/Urinary Tract: Adrenal glands are within normal limits. Kidneys are atrophic bilaterally but stable. No obstructive changes are seen. Transplant kidney is noted in the left hemipelvis without obstructive change. No renal calculi are noted. Bladder is partially distended. Stomach/Bowel: Colon shows  no obstructive or inflammatory changes. The appendix  is within normal limits. No small bowel or gastric abnormality is seen. Vascular/Lymphatic: Aortic atherosclerosis. No enlarged abdominal or pelvic lymph nodes. Reproductive: Prostate is unremarkable. Other: No abdominal wall hernia or abnormality. No abdominopelvic ascites. Musculoskeletal: No acute or significant osseous findings. IMPRESSION: Status post kidney transplant without obstructive change. The native kidneys are atrophic. Known pancreas transplant is not well appreciated on this noncontrast study. No other focal abnormality is noted. Electronically Signed   By: Alcide Clever M.D.   On: 02/22/2021 22:49   CT Head Wo Contrast  Result Date: 02/22/2021 CLINICAL DATA:  70 year old male with headache. EXAM: CT HEAD WITHOUT CONTRAST TECHNIQUE: Contiguous axial images were obtained from the base of the skull through the vertex without intravenous contrast. COMPARISON:  Head CT dated 06/26/2020. FINDINGS: Brain: Mild age-related atrophy and chronic microvascular ischemic changes. There is no acute intracranial hemorrhage. No mass effect midline shift. No extra-axial fluid collection. Vascular: Similar appearance of vascular calcifications. Skull: Normal. Negative for fracture or focal lesion. Sinuses/Orbits: No acute finding. Other: None IMPRESSION: 1. No acute intracranial pathology. 2. Mild age-related atrophy and chronic microvascular ischemic changes. Electronically Signed   By: Elgie Collard M.D.   On: 02/22/2021 22:43   DG Chest Port 1 View  Result Date: 02/22/2021 CLINICAL DATA:  Cough. EXAM: PORTABLE CHEST 1 VIEW COMPARISON:  June 26, 2020 FINDINGS: The heart size and mediastinal contours are within normal limits. Both lungs are clear. The visualized skeletal structures are unremarkable. IMPRESSION: No active disease. Electronically Signed   By: Ted Mcalpine M.D.   On: 02/22/2021 17:56      ED Course/Procedures   Clinical Course as of 02/22/21 2311  Sat Feb 22, 2021  1754  Chest x-ray without any definite infiltrates.  Awaiting radiology reading. [MB]  1824 Patient's lactate elevated at 2.8.  He has IV fluids infusing.  I am not sure this represents sepsis as he is got no fever or white count.  He was having a lot of retching and it may be related to that. [MB]  1935 Labs showing his bicarb down and his glucose up but his gap is normal.  pH is also normal.  Do not think this is DKA at this point.  Blood pressure markedly elevated in was having the intractable vomiting so put him in for CT head and CT abdomen and pelvis without contrast. [MB]  1936 Labetalol seems to have helped his tachycardia and his hypertension.  Unclear of his compliance with his medications. [MB]  2033 Patient's care is signed out to [facility] [place] to follow-up on CT imaging.  His symptoms seem better controlled currently.  If no acute findings on imaging can reassess to see if appropriate for discharge.  Likely need to be admitted hospital for further management of his symptoms. [MB]  2248 Nausea returning.  Pending CT imaging.  We will plan for admission. [JR]    Clinical Course User Index [JR] Sherriann Szuch, [place] N, PA-C [MB] Terrilee Files, MD    Procedures  MDM  CT head and AP are negative for acute findings.  Patient began vomiting once more soon after reevaluation.  Ativan ordered.  Blood pressure uptrending once more.  Additional fluids are ordered.  Patient will need admission for intractable nausea vomiting.     Paras Kreider, [place] N, PA-C 02/22/21 2312    Terrilee Files, MD 02/23/21 [phone]

## 2021-02-23 DIAGNOSIS — Z8616 Personal history of COVID-19: Secondary | ICD-10-CM | POA: Diagnosis not present

## 2021-02-23 DIAGNOSIS — H548 Legal blindness, as defined in USA: Secondary | ICD-10-CM | POA: Diagnosis present

## 2021-02-23 DIAGNOSIS — I10 Essential (primary) hypertension: Secondary | ICD-10-CM | POA: Diagnosis present

## 2021-02-23 DIAGNOSIS — Z89512 Acquired absence of left leg below knee: Secondary | ICD-10-CM | POA: Diagnosis not present

## 2021-02-23 DIAGNOSIS — K2100 Gastro-esophageal reflux disease with esophagitis, without bleeding: Secondary | ICD-10-CM | POA: Diagnosis present

## 2021-02-23 DIAGNOSIS — Z794 Long term (current) use of insulin: Secondary | ICD-10-CM | POA: Diagnosis not present

## 2021-02-23 DIAGNOSIS — I160 Hypertensive urgency: Secondary | ICD-10-CM | POA: Diagnosis not present

## 2021-02-23 DIAGNOSIS — E079 Disorder of thyroid, unspecified: Secondary | ICD-10-CM | POA: Diagnosis present

## 2021-02-23 DIAGNOSIS — R112 Nausea with vomiting, unspecified: Secondary | ICD-10-CM | POA: Diagnosis not present

## 2021-02-23 DIAGNOSIS — Z7982 Long term (current) use of aspirin: Secondary | ICD-10-CM | POA: Diagnosis not present

## 2021-02-23 DIAGNOSIS — G4730 Sleep apnea, unspecified: Secondary | ICD-10-CM | POA: Diagnosis present

## 2021-02-23 DIAGNOSIS — Y830 Surgical operation with transplant of whole organ as the cause of abnormal reaction of the patient, or of later complication, without mention of misadventure at the time of the procedure: Secondary | ICD-10-CM | POA: Diagnosis present

## 2021-02-23 DIAGNOSIS — Z9483 Pancreas transplant status: Secondary | ICD-10-CM | POA: Diagnosis not present

## 2021-02-23 DIAGNOSIS — K3184 Gastroparesis: Secondary | ICD-10-CM | POA: Diagnosis present

## 2021-02-23 DIAGNOSIS — Z79899 Other long term (current) drug therapy: Secondary | ICD-10-CM | POA: Diagnosis not present

## 2021-02-23 DIAGNOSIS — F4310 Post-traumatic stress disorder, unspecified: Secondary | ICD-10-CM | POA: Diagnosis present

## 2021-02-23 DIAGNOSIS — R1319 Other dysphagia: Secondary | ICD-10-CM | POA: Diagnosis not present

## 2021-02-23 DIAGNOSIS — K219 Gastro-esophageal reflux disease without esophagitis: Secondary | ICD-10-CM | POA: Diagnosis not present

## 2021-02-23 DIAGNOSIS — E1051 Type 1 diabetes mellitus with diabetic peripheral angiopathy without gangrene: Secondary | ICD-10-CM | POA: Diagnosis present

## 2021-02-23 DIAGNOSIS — T8619 Other complication of kidney transplant: Secondary | ICD-10-CM | POA: Diagnosis present

## 2021-02-23 DIAGNOSIS — Z87891 Personal history of nicotine dependence: Secondary | ICD-10-CM | POA: Diagnosis not present

## 2021-02-23 DIAGNOSIS — K222 Esophageal obstruction: Secondary | ICD-10-CM | POA: Diagnosis present

## 2021-02-23 DIAGNOSIS — D6959 Other secondary thrombocytopenia: Secondary | ICD-10-CM | POA: Diagnosis present

## 2021-02-23 DIAGNOSIS — R933 Abnormal findings on diagnostic imaging of other parts of digestive tract: Secondary | ICD-10-CM | POA: Diagnosis not present

## 2021-02-23 DIAGNOSIS — E1042 Type 1 diabetes mellitus with diabetic polyneuropathy: Secondary | ICD-10-CM | POA: Diagnosis present

## 2021-02-23 DIAGNOSIS — E1043 Type 1 diabetes mellitus with diabetic autonomic (poly)neuropathy: Secondary | ICD-10-CM | POA: Diagnosis present

## 2021-02-23 DIAGNOSIS — Z8249 Family history of ischemic heart disease and other diseases of the circulatory system: Secondary | ICD-10-CM | POA: Diagnosis not present

## 2021-02-23 DIAGNOSIS — Z20822 Contact with and (suspected) exposure to covid-19: Secondary | ICD-10-CM | POA: Diagnosis present

## 2021-02-23 DIAGNOSIS — N179 Acute kidney failure, unspecified: Secondary | ICD-10-CM | POA: Diagnosis present

## 2021-02-23 LAB — CBC
HCT: 46.1 % (ref 39.0–52.0)
Hemoglobin: 14.8 g/dL (ref 13.0–17.0)
MCH: 26.2 pg (ref 26.0–34.0)
MCHC: 32.1 g/dL (ref 30.0–36.0)
MCV: 81.7 fL (ref 80.0–100.0)
Platelets: 97 10*3/uL — ABNORMAL LOW (ref 150–400)
RBC: 5.64 MIL/uL (ref 4.22–5.81)
RDW: 14.9 % (ref 11.5–15.5)
WBC: 8.7 10*3/uL (ref 4.0–10.5)
nRBC: 0 % (ref 0.0–0.2)

## 2021-02-23 LAB — BASIC METABOLIC PANEL
Anion gap: 12 (ref 5–15)
BUN: 15 mg/dL (ref 8–23)
CO2: 19 mmol/L — ABNORMAL LOW (ref 22–32)
Calcium: 9.6 mg/dL (ref 8.9–10.3)
Chloride: 108 mmol/L (ref 98–111)
Creatinine, Ser: 0.95 mg/dL (ref 0.61–1.24)
GFR, Estimated: >60 mL/min (ref >60)
Glucose, Bld: 224 mg/dL — ABNORMAL HIGH (ref 70–99)
Potassium: 4.1 mmol/L (ref 3.5–5.1)
Sodium: 139 mmol/L (ref 135–145)

## 2021-02-23 LAB — GLUCOSE, CAPILLARY
Glucose-Capillary: 197 mg/dL — ABNORMAL HIGH (ref 70–99)
Glucose-Capillary: 206 mg/dL — ABNORMAL HIGH (ref 70–99)
Glucose-Capillary: 180 mg/dL — ABNORMAL HIGH (ref 70–99)
Glucose-Capillary: 170 mg/dL — ABNORMAL HIGH (ref 70–99)

## 2021-02-23 LAB — TROPONIN I (HIGH SENSITIVITY): Troponin I (High Sensitivity): 25 ng/L — ABNORMAL HIGH (ref <18)

## 2021-02-23 LAB — HIV ANTIBODY (ROUTINE TESTING W REFLEX): HIV Screen 4th Generation wRfx: NONREACTIVE

## 2021-02-23 LAB — LACTIC ACID, PLASMA: Lactic Acid, Venous: 2.1 mmol/L (ref 0.5–1.9)

## 2021-02-23 MED ORDER — ONDANSETRON HCL 4 MG/2ML IJ SOLN
INTRAMUSCULAR | Status: AC
  Filled 2021-02-23: qty 2

## 2021-02-23 MED ORDER — ONDANSETRON HCL 4 MG PO TABS: 4.0000 mg | ORAL_TABLET | Freq: Four times a day (QID) | ORAL | Status: DC | PRN

## 2021-02-23 MED ORDER — HYDRALAZINE HCL 20 MG/ML IJ SOLN: 10.0000 mg | Freq: Three times a day (TID) | INTRAMUSCULAR | Status: DC | PRN

## 2021-02-23 MED ORDER — ACETAMINOPHEN 650 MG RE SUPP: 650.0000 mg | Freq: Four times a day (QID) | RECTAL | Status: DC | PRN

## 2021-02-23 MED ORDER — SODIUM CHLORIDE 0.9 % IV SOLN: INTRAVENOUS | Status: DC

## 2021-02-23 MED ORDER — ACETAMINOPHEN 325 MG PO TABS: 650.0000 mg | ORAL_TABLET | Freq: Four times a day (QID) | ORAL | Status: DC | PRN

## 2021-02-23 MED ORDER — POTASSIUM CHLORIDE 20 MEQ PO PACK
20.0000 meq | PACK | Freq: Every day | ORAL | Status: DC
  Administered 2021-02-23 – 2021-02-27 (×4): 20 meq via ORAL
  Filled 2021-02-23 (×4): qty 1

## 2021-02-23 MED ORDER — ONDANSETRON HCL 4 MG/2ML IJ SOLN
4.0000 mg | Freq: Four times a day (QID) | INTRAMUSCULAR | Status: DC | PRN
  Administered 2021-02-23 – 2021-02-25 (×2): 4 mg via INTRAVENOUS
  Filled 2021-02-23: qty 2

## 2021-02-23 MED ORDER — TRAZODONE HCL 50 MG PO TABS
50.0000 mg | ORAL_TABLET | Freq: Every evening | ORAL | Status: DC | PRN
  Administered 2021-02-25: 50 mg via ORAL
  Filled 2021-02-23 (×2): qty 1

## 2021-02-23 MED ORDER — AMLODIPINE BESYLATE 10 MG PO TABS
10.0000 mg | ORAL_TABLET | Freq: Every day | ORAL | Status: DC
  Administered 2021-02-23 – 2021-02-27 (×5): 10 mg via ORAL
  Filled 2021-02-23 (×6): qty 1

## 2021-02-23 MED ORDER — PANTOPRAZOLE SODIUM 40 MG PO TBEC
40.0000 mg | DELAYED_RELEASE_TABLET | Freq: Every day | ORAL | Status: DC
  Administered 2021-02-23 – 2021-02-27 (×5): 40 mg via ORAL
  Filled 2021-02-23 (×5): qty 1

## 2021-02-23 MED ORDER — ATORVASTATIN CALCIUM 40 MG PO TABS
40.0000 mg | ORAL_TABLET | Freq: Every day | ORAL | Status: DC
  Administered 2021-02-23 – 2021-02-26 (×3): 40 mg via ORAL
  Filled 2021-02-23 (×4): qty 1

## 2021-02-23 MED ORDER — LABETALOL HCL 200 MG PO TABS
200.0000 mg | ORAL_TABLET | Freq: Two times a day (BID) | ORAL | Status: DC
  Administered 2021-02-23 – 2021-02-27 (×9): 200 mg via ORAL
  Filled 2021-02-23 (×10): qty 1

## 2021-02-23 MED ORDER — METOCLOPRAMIDE HCL 5 MG/ML IJ SOLN
10.0000 mg | Freq: Two times a day (BID) | INTRAMUSCULAR | Status: DC
  Administered 2021-02-23 – 2021-02-27 (×8): 10 mg via INTRAVENOUS
  Filled 2021-02-23 (×9): qty 2

## 2021-02-23 MED ORDER — ASPIRIN EC 81 MG PO TBEC
81.0000 mg | DELAYED_RELEASE_TABLET | Freq: Every day | ORAL | Status: DC
  Administered 2021-02-23 – 2021-02-27 (×5): 81 mg via ORAL
  Filled 2021-02-23 (×5): qty 1

## 2021-02-23 MED ORDER — LOSARTAN POTASSIUM 50 MG PO TABS
50.0000 mg | ORAL_TABLET | Freq: Every day | ORAL | Status: DC
  Administered 2021-02-23 – 2021-02-27 (×5): 50 mg via ORAL
  Filled 2021-02-23 (×5): qty 1

## 2021-02-23 MED ORDER — TACROLIMUS 1 MG PO CAPS
5.0000 mg | ORAL_CAPSULE | Freq: Two times a day (BID) | ORAL | Status: DC
  Administered 2021-02-23 – 2021-02-27 (×9): 5 mg via ORAL
  Filled 2021-02-23 (×10): qty 5

## 2021-02-23 MED ORDER — ENOXAPARIN SODIUM 40 MG/0.4ML IJ SOSY
40.0000 mg | PREFILLED_SYRINGE | INTRAMUSCULAR | Status: DC
  Administered 2021-02-23 – 2021-02-27 (×5): 40 mg via SUBCUTANEOUS
  Filled 2021-02-23 (×5): qty 0.4

## 2021-02-23 MED ORDER — METOPROLOL TARTRATE 5 MG/5ML IV SOLN
5.0000 mg | Freq: Four times a day (QID) | INTRAVENOUS | Status: DC | PRN
  Administered 2021-02-25: 5 mg via INTRAVENOUS
  Filled 2021-02-23: qty 5

## 2021-02-23 MED ORDER — FUROSEMIDE 20 MG PO TABS
20.0000 mg | ORAL_TABLET | Freq: Every day | ORAL | Status: DC
  Administered 2021-02-23 – 2021-02-25 (×3): 20 mg via ORAL
  Filled 2021-02-23 (×3): qty 1

## 2021-02-23 MED ORDER — MYCOPHENOLATE MOFETIL 250 MG PO CAPS
1000.0000 mg | ORAL_CAPSULE | Freq: Two times a day (BID) | ORAL | Status: DC
  Administered 2021-02-23 – 2021-02-27 (×9): 1000 mg via ORAL
  Filled 2021-02-23 (×11): qty 4

## 2021-02-23 MED ORDER — INSULIN ASPART 100 UNIT/ML IJ SOLN
0.0000 [IU] | Freq: Four times a day (QID) | INTRAMUSCULAR | Status: DC | PRN
  Administered 2021-02-23 – 2021-02-24 (×5): 2 [IU] via SUBCUTANEOUS
  Administered 2021-02-25: 3 [IU] via SUBCUTANEOUS
  Administered 2021-02-25: 2 [IU] via SUBCUTANEOUS
  Administered 2021-02-26 – 2021-02-27 (×2): 1 [IU] via SUBCUTANEOUS

## 2021-02-23 MED ORDER — INSULIN GLARGINE 100 UNIT/ML ~~LOC~~ SOLN
14.0000 [IU] | Freq: Every day | SUBCUTANEOUS | Status: DC
  Administered 2021-02-23 – 2021-02-26 (×3): 14 [IU] via SUBCUTANEOUS
  Filled 2021-02-23 (×6): qty 0.14

## 2021-02-23 NOTE — Progress Notes (Signed)
   02/23/21 0132  Assess: MEWS Score  Temp 98.6 F (37 C)  BP (!) 174/79  Pulse Rate (!) 113  Resp (!) 22  SpO2 100 %  O2 Device Room Air  Assess: MEWS Score  MEWS Temp 0  MEWS Systolic 0  MEWS Pulse 2  MEWS RR 1  MEWS LOC 0  MEWS Score 3  MEWS Score Color Yellow  Assess: if the MEWS score is Yellow or Red  Were vital signs taken at a resting state? Yes  Focused Assessment No change from prior assessment  Early Detection of Sepsis Score *See Row Information* Low  MEWS guidelines implemented *See Row Information* No, previously yellow, continue vital signs every 4 hours  Document  Patient Outcome Stabilized after interventions  Progress note created (see row info) Yes

## 2021-02-23 NOTE — Hospital Course (Signed)
70 year old black male DM TY 1+ renal/pancreatic transplant 2009 tacrolimus 5 twice daily Cannabis use with cyclical vomiting syndrome PAD status post left BKA  Presented [facility] 02/22/2021 back pain chills nausea vomiting Lactic acid 2.8 Found to be tachycardic hypertensive Rx Ativan Reglan Rx IV fluid hydration  BUNs/creatinine 20/0.9-->15/0.9 Lactic acid 2.1 Bicarb 19 Troponin 17-->25

## 2021-02-23 NOTE — Progress Notes (Signed)
PROGRESS NOTE   [name].  [id number] DOB: 09-Sep-1951 DOA: 02/22/2021 PCP: [name], MD  Brief Narrative:  70 year old black male DM TY 1+ renal/pancreatic transplant 2009 tacrolimus 5 twice daily Cannabis use with cyclical vomiting syndrome PAD status post left BKA  Presented [facility] 02/22/2021 back pain chills nausea vomiting Lactic acid 2.8 Found to be tachycardic hypertensive Rx Ativan Reglan Rx IV fluid hydration  Hospital-Problem based course Likely cyclical vomiting syndrome with cannabis habituation Empiric management with Zofran every 6 as needed, will add 1 more dose of Reglan as he is still nauseous and continue Protonix p.o. 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 Continue sliding scale coverage 206-->224 Add back Lantus but at lower rate of 14 units as he is not really eating He is supposed to be on Reglan 5 3 times daily at home and lisinopril which we will hold for the time being Kidney/pancreatic transplant Continue CellCept 1000 twice daily, Prograf 5 twice daily Monitor labs daily-they seem stable at this time   DVT prophylaxis: Lovenox Code Status: Full Family Communication: None present Disposition:  Status is: Observation  The patient will require care spanning > 2 midnights and should be moved to inpatient because: Hemodynamically unstable, Altered mental status and Ongoing diagnostic testing needed not appropriate for outpatient work up  Dispo: The patient is from: Home              Anticipated d/c is to: Home              Patient currently is not medically stable to d/c.   Difficult to place patient No       Consultants:   None yet  Procedures: No  Antimicrobials:    Subjective:  Awake coherent no distress when I saw him in  the morning however in the afternoon he appears to have had further nausea   Objective: Vitals:   02/23/21 0130 02/23/21 0132 02/23/21 0525 02/23/21 1153  BP: (!) 174/79 (!) 174/79 (!) 162/75 (!) 142/51  Pulse: (!) 111 (!) 113 93 87  Resp: (!) 22 (!) 22 18 18   Temp: 98.6 F (37 C) 98.6 F (37 C) 98.4 F (36.9 C) 99.3 F (37.4 C)  TempSrc: Oral Oral Oral Oral  SpO2: 100% 100% 99% 100%  Weight: 72.1 kg     Height: 5\' 2"  (1.575 m)       Intake/Output Summary (Last 24 hours) at 02/23/2021 1635 Last data filed at 02/23/2021 1528 Gross per 24 hour  Intake 1500 ml  Output 325 ml  Net 1175 ml   Filed Weights   02/22/21 1801 02/23/21 0130  Weight: 79.4 kg 72.1 kg    Examination:  EOMI NCAT no focal deficit Looks about stated age in fact younger Chest clear no added sound Abdomen soft no rebound no guarding Neurologically moving all 4 limbs equally  Data Reviewed: personally reviewed   CBC    Component Value Date/Time   WBC 8.7 02/23/2021 0553   RBC 5.64 02/23/2021 0553   HGB 14.8 02/23/2021 0553   HCT 46.1 02/23/2021 0553   PLT 97 (L) 02/23/2021 0553   MCV 81.7 02/23/2021 0553   MCH 26.2 02/23/2021 0553   MCHC 32.1 02/23/2021 0553   RDW 14.9 02/23/2021 0553   LYMPHSABS 0.6 (L) 02/22/2021 1742   MONOABS  0.3 02/22/2021 1742   EOSABS 0.0 02/22/2021 1742   BASOSABS 0.0 02/22/2021 1742   CMP Latest Ref Rng & Units 02/23/2021 02/22/2021 02/22/2021  Glucose 70 - 99 mg/dL 536(R) - 443(X)  BUN 8 - 23 mg/dL 15 - 20  Creatinine 5.40 - 1.24 mg/dL 0.86 - 7.61  Sodium 950 - 145 mmol/L 139 140 140  Potassium 3.5 - 5.1 mmol/L 4.1 4.3 4.1  Chloride 98 - 111 mmol/L 108 - 108  CO2 22 - 32 mmol/L 19(L) - -  Calcium 8.9 - 10.3 mg/dL 9.6 - -  Total Protein 6.5 - 8.1 g/dL - - -  Total Bilirubin 0.3 - 1.2 mg/dL - - -  Alkaline Phos 38 - 126 U/L - - -  AST 15 - 41 U/L - - -  ALT 0 - 44 U/L - - -     Radiology Studies: CT Abdomen Pelvis Wo Contrast  Result Date:  02/22/2021 CLINICAL DATA:  Acute abdominal pain EXAM: CT ABDOMEN AND PELVIS WITHOUT CONTRAST TECHNIQUE: Multidetector CT imaging of the abdomen and pelvis was performed following the standard protocol without IV contrast. COMPARISON:  01/30/2020 FINDINGS: Lower chest: No acute abnormality. Hepatobiliary: No focal liver abnormality is seen. No gallstones, gallbladder wall thickening, or biliary dilatation. Pancreas: Unremarkable. No pancreatic ductal dilatation or surrounding inflammatory changes. Known pancreatic transplant is not well appreciated on this noncontrast study. Spleen: Normal in size without focal abnormality. Adrenals/Urinary Tract: Adrenal glands are within normal limits. Kidneys are atrophic bilaterally but stable. No obstructive changes are seen. Transplant kidney is noted in the left hemipelvis without obstructive change. No renal calculi are noted. Bladder is partially distended. Stomach/Bowel: Colon shows no obstructive or inflammatory changes. The appendix is within normal limits. No small bowel or gastric abnormality is seen. Vascular/Lymphatic: Aortic atherosclerosis. No enlarged abdominal or pelvic lymph nodes. Reproductive: Prostate is unremarkable. Other: No abdominal wall hernia or abnormality. No abdominopelvic ascites. Musculoskeletal: No acute or significant osseous findings. IMPRESSION: Status post kidney transplant without obstructive change. The native kidneys are atrophic. Known pancreas transplant is not well appreciated on this noncontrast study. No other focal abnormality is noted. Electronically Signed   By: Alcide Clever M.D.   On: 02/22/2021 22:49   CT Head Wo Contrast  Result Date: 02/22/2021 CLINICAL DATA:  70 year old male with headache. EXAM: CT HEAD WITHOUT CONTRAST TECHNIQUE: Contiguous axial images were obtained from the base of the skull through the vertex without intravenous contrast. COMPARISON:  Head CT dated 06/26/2020. FINDINGS: Brain: Mild age-related atrophy  and chronic microvascular ischemic changes. There is no acute intracranial hemorrhage. No mass effect midline shift. No extra-axial fluid collection. Vascular: Similar appearance of vascular calcifications. Skull: Normal. Negative for fracture or focal lesion. Sinuses/Orbits: No acute finding. Other: None IMPRESSION: 1. No acute intracranial pathology. 2. Mild age-related atrophy and chronic microvascular ischemic changes. Electronically Signed   By: Elgie Collard M.D.   On: 02/22/2021 22:43   DG Chest Port 1 View  Result Date: 02/22/2021 CLINICAL DATA:  Cough. EXAM: PORTABLE CHEST 1 VIEW COMPARISON:  June 26, 2020 FINDINGS: The heart size and mediastinal contours are within normal limits. Both lungs are clear. The visualized skeletal structures are unremarkable. IMPRESSION: No active disease. Electronically Signed   By: Ted Mcalpine M.D.   On: 02/22/2021 17:56     Scheduled Meds: . amLODipine  10 mg Oral Daily  . aspirin EC  81 mg Oral Daily  . atorvastatin  40 mg Oral QHS  . enoxaparin (LOVENOX)  injection  40 mg Subcutaneous Q24H  . furosemide  20 mg Oral Daily  . insulin glargine  14 Units Subcutaneous QHS  . labetalol  200 mg Oral BID  . losartan  50 mg Oral Daily  . metoCLOPramide (REGLAN) injection  10 mg Intravenous Q12H  . mycophenolate  1,000 mg Oral BID  . pantoprazole  40 mg Oral Daily  . potassium chloride  20 mEq Oral Daily  . tacrolimus  5 mg Oral BID   Continuous Infusions:   LOS: 0 days   Time spent: 4  Rhetta Mura, MD [facility] To contact the attending provider between 7A-7P or the covering provider during after hours 7P-7A, please log into the web site [email or web address] and access using universal [place] password for that web site. If you do not have the password, please call the hospital operator.  02/23/2021, 4:35 PM

## 2021-02-24 DIAGNOSIS — I160 Hypertensive urgency: Secondary | ICD-10-CM | POA: Diagnosis not present

## 2021-02-24 LAB — COMPREHENSIVE METABOLIC PANEL
ALT: 11 U/L (ref 0–44)
AST: 17 U/L (ref 15–41)
Albumin: 4.4 g/dL (ref 3.5–5.0)
Alkaline Phosphatase: 83 U/L (ref 38–126)
Anion gap: 12 (ref 5–15)
BUN: 19 mg/dL (ref 8–23)
CO2: 19 mmol/L — ABNORMAL LOW (ref 22–32)
Calcium: 9.7 mg/dL (ref 8.9–10.3)
Chloride: 103 mmol/L (ref 98–111)
Creatinine, Ser: 1.22 mg/dL (ref 0.61–1.24)
GFR, Estimated: >60 mL/min (ref >60)
Glucose, Bld: 143 mg/dL — ABNORMAL HIGH (ref 70–99)
Potassium: 3.9 mmol/L (ref 3.5–5.1)
Sodium: 134 mmol/L — ABNORMAL LOW (ref 135–145)
Total Bilirubin: 0.8 mg/dL (ref 0.3–1.2)
Total Protein: 7.3 g/dL (ref 6.5–8.1)

## 2021-02-24 LAB — CBC WITH DIFFERENTIAL/PLATELET
Abs Immature Granulocytes: 0.14 10*3/uL — ABNORMAL HIGH (ref 0.00–0.07)
Basophils Absolute: 0 10*3/uL (ref 0.0–0.1)
Basophils Relative: 0 %
Eosinophils Absolute: 0 10*3/uL (ref 0.0–0.5)
Eosinophils Relative: 0 %
HCT: 47.2 % (ref 39.0–52.0)
Hemoglobin: 15 g/dL (ref 13.0–17.0)
Immature Granulocytes: 1 %
Lymphocytes Relative: 13 %
Lymphs Abs: 1.4 10*3/uL (ref 0.7–4.0)
MCH: 26.2 pg (ref 26.0–34.0)
MCHC: 31.8 g/dL (ref 30.0–36.0)
MCV: 82.4 fL (ref 80.0–100.0)
Monocytes Absolute: 0.8 10*3/uL (ref 0.1–1.0)
Monocytes Relative: 8 %
Neutro Abs: 7.9 10*3/uL — ABNORMAL HIGH (ref 1.7–7.7)
Neutrophils Relative %: 78 %
Platelets: 107 10*3/uL — ABNORMAL LOW (ref 150–400)
RBC: 5.73 MIL/uL (ref 4.22–5.81)
RDW: 15 % (ref 11.5–15.5)
WBC: 10.3 10*3/uL (ref 4.0–10.5)
nRBC: 0 % (ref 0.0–0.2)

## 2021-02-24 LAB — GLUCOSE, CAPILLARY
Glucose-Capillary: 199 mg/dL — ABNORMAL HIGH (ref 70–99)
Glucose-Capillary: 165 mg/dL — ABNORMAL HIGH (ref 70–99)
Glucose-Capillary: 200 mg/dL — ABNORMAL HIGH (ref 70–99)
Glucose-Capillary: 106 mg/dL — ABNORMAL HIGH (ref 70–99)

## 2021-02-24 NOTE — Progress Notes (Addendum)
Patient complaining of "difficulty swallowing".  States has had this problem "ever since my surgery on my throat several years ago".  It was assessed that he appears to have difficulty mobilizing secretions and food boluses, causing him to vomit undigested food.  This is causing him much concern.  ? If requires speech eval.  Was given Reglan IV last night with effect, he states.  This was repeated this am.  Will monitor.

## 2021-02-24 NOTE — Progress Notes (Signed)
PROGRESS NOTE   [name].  FXJ:[id number] DOB: 1951-05-04 DOA: 02/22/2021 PCP: [name], MD  Brief Narrative:  70 year old black male DM TY 1+ renal/pancreatic transplant 2009 tacrolimus 5 twice daily Cannabis use with cyclical vomiting syndrome PAD status post left BKA  Presented [facility] 02/22/2021 back pain chills nausea vomiting Lactic acid 2.8 Found to be tachycardic hypertensive Rx Ativan Reglan Rx IV fluid hydration  Hospital-Problem based course Cyclical vomiting syndrome Past history of also cervical disc disease status post instrumentation resulting in dysphagia Patient not really eating any solids at this time Reviewed notes from [facility]-5/19-speech therapy saw the patient and told him to perform esophageal exercises I will get thin liquid barium swallow to ensure no mechanical obstruction although it looks like he has some work-up scheduled in the outpatient setting and should continue follow-up care Empiric Zofran IV Reglan continue Protonix at this time If he is able to tolerate solids we can discharge him in the next day or so Hypertensive urgency Continue amlodipine 10, labetalol 200 twice daily, Cozaar 50 daily As needed hydralazine 10 mg every 6 as needed blood pressure >170 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 Reglan in a.m. Kidney/pancreatic transplant Continue CellCept 1000 twice daily, Prograf 5 twice daily Monitor labs daily-they seem stable at this time   DVT prophylaxis: Lovenox Code Status: Full Family Communication: None present Disposition:  Status is: Observation  The patient will require care spanning > 2 midnights and should be moved to inpatient because: Hemodynamically unstable, Altered mental status and Ongoing diagnostic testing needed not appropriate for outpatient work  up  Dispo: The patient is from: Home              Anticipated d/c is to: Home              Patient currently is not medically stable to d/c.   Difficult to place patient No       Consultants:   None yet  Procedures: No  Antimicrobials:    Subjective:  Not really eating only drinking fluids No chest pain Asking me if he can be worked up in this hospital for swallowing issues which have been going on for over a year-speech therapy note reviewed   Objective: Vitals:   02/23/21 2055 02/23/21 2323 02/24/21 0505 02/24/21 1051  BP: (!) 156/82 (!) 164/61 (!) 145/65 (!) 161/68  Pulse: (!) 105 (!) 106 98 (!) 104  Resp: 18 18 18 18   Temp: 98.3 F (36.8 C) 99.1 F (37.3 C) 98.5 F (36.9 C) 98.3 F (36.8 C)  TempSrc: Oral Oral Oral Oral  SpO2: 97% 97% 100% 100%  Weight:   70.9 kg   Height:        Intake/Output Summary (Last 24 hours) at 02/24/2021 1645 Last data filed at 02/24/2021 1200 Gross per 24 hour  Intake 1080 ml  Output 375 ml  Net 705 ml   Filed Weights   02/22/21 1801 02/23/21 0130 02/24/21 0505  Weight: 79.4 kg 72.1 kg 70.9 kg    Examination:  E looks younger than stated age No distress Chest clear Abdomen soft Left lower extremity amputation noted Neurologically intact  Data Reviewed: personally reviewed   CBC    Component Value Date/Time   WBC 10.3 02/24/2021 0203   RBC 5.73 02/24/2021 0203   HGB 15.0 02/24/2021  0203   HCT 47.2 02/24/2021 0203   PLT 107 (L) 02/24/2021 0203   MCV 82.4 02/24/2021 0203   MCH 26.2 02/24/2021 0203   MCHC 31.8 02/24/2021 0203   RDW 15.0 02/24/2021 0203   LYMPHSABS 1.4 02/24/2021 0203   MONOABS 0.8 02/24/2021 0203   EOSABS 0.0 02/24/2021 0203   BASOSABS 0.0 02/24/2021 0203   CMP Latest Ref Rng & Units 02/24/2021 02/23/2021 02/22/2021  Glucose 70 - 99 mg/dL 300(P) 233(A) -  BUN 8 - 23 mg/dL 19 15 -  Creatinine 0.76 - 1.24 mg/dL 2.26 3.33 -  Sodium 545 - 145 mmol/L 134(L) 139 140  Potassium 3.5 - 5.1 mmol/L  3.9 4.1 4.3  Chloride 98 - 111 mmol/L 103 108 -  CO2 22 - 32 mmol/L 19(L) 19(L) -  Calcium 8.9 - 10.3 mg/dL 9.7 9.6 -  Total Protein 6.5 - 8.1 g/dL 7.3 - -  Total Bilirubin 0.3 - 1.2 mg/dL 0.8 - -  Alkaline Phos 38 - 126 U/L 83 - -  AST 15 - 41 U/L 17 - -  ALT 0 - 44 U/L 11 - -     Radiology Studies: CT Abdomen Pelvis Wo Contrast  Result Date: 02/22/2021 CLINICAL DATA:  Acute abdominal pain EXAM: CT ABDOMEN AND PELVIS WITHOUT CONTRAST TECHNIQUE: Multidetector CT imaging of the abdomen and pelvis was performed following the standard protocol without IV contrast. COMPARISON:  01/30/2020 FINDINGS: Lower chest: No acute abnormality. Hepatobiliary: No focal liver abnormality is seen. No gallstones, gallbladder wall thickening, or biliary dilatation. Pancreas: Unremarkable. No pancreatic ductal dilatation or surrounding inflammatory changes. Known pancreatic transplant is not well appreciated on this noncontrast study. Spleen: Normal in size without focal abnormality. Adrenals/Urinary Tract: Adrenal glands are within normal limits. Kidneys are atrophic bilaterally but stable. No obstructive changes are seen. Transplant kidney is noted in the left hemipelvis without obstructive change. No renal calculi are noted. Bladder is partially distended. Stomach/Bowel: Colon shows no obstructive or inflammatory changes. The appendix is within normal limits. No small bowel or gastric abnormality is seen. Vascular/Lymphatic: Aortic atherosclerosis. No enlarged abdominal or pelvic lymph nodes. Reproductive: Prostate is unremarkable. Other: No abdominal wall hernia or abnormality. No abdominopelvic ascites. Musculoskeletal: No acute or significant osseous findings. IMPRESSION: Status post kidney transplant without obstructive change. The native kidneys are atrophic. Known pancreas transplant is not well appreciated on this noncontrast study. No other focal abnormality is noted. Electronically Signed   By: Alcide Clever  M.D.   On: 02/22/2021 22:49   CT Head Wo Contrast  Result Date: 02/22/2021 CLINICAL DATA:  70 year old male with headache. EXAM: CT HEAD WITHOUT CONTRAST TECHNIQUE: Contiguous axial images were obtained from the base of the skull through the vertex without intravenous contrast. COMPARISON:  Head CT dated 06/26/2020. FINDINGS: Brain: Mild age-related atrophy and chronic microvascular ischemic changes. There is no acute intracranial hemorrhage. No mass effect midline shift. No extra-axial fluid collection. Vascular: Similar appearance of vascular calcifications. Skull: Normal. Negative for fracture or focal lesion. Sinuses/Orbits: No acute finding. Other: None IMPRESSION: 1. No acute intracranial pathology. 2. Mild age-related atrophy and chronic microvascular ischemic changes. Electronically Signed   By: Elgie Collard M.D.   On: 02/22/2021 22:43   DG Chest Port 1 View  Result Date: 02/22/2021 CLINICAL DATA:  Cough. EXAM: PORTABLE CHEST 1 VIEW COMPARISON:  June 26, 2020 FINDINGS: The heart size and mediastinal contours are within normal limits. Both lungs are clear. The visualized skeletal structures are unremarkable. IMPRESSION: No active disease. Electronically  Signed   By: Ted Mcalpine M.D.   On: 02/22/2021 17:56     Scheduled Meds: . amLODipine  10 mg Oral Daily  . aspirin EC  81 mg Oral Daily  . atorvastatin  40 mg Oral QHS  . enoxaparin (LOVENOX) injection  40 mg Subcutaneous Q24H  . furosemide  20 mg Oral Daily  . insulin glargine  14 Units Subcutaneous QHS  . labetalol  200 mg Oral BID  . losartan  50 mg Oral Daily  . metoCLOPramide (REGLAN) injection  10 mg Intravenous Q12H  . mycophenolate  1,000 mg Oral BID  . pantoprazole  40 mg Oral Daily  . potassium chloride  20 mEq Oral Daily  . tacrolimus  5 mg Oral BID   Continuous Infusions:   LOS: 1 day   Time spent: 53  Rhetta Mura, MD [facility] To contact the attending provider between 7A-7P or  the covering provider during after hours 7P-7A, please log into the web site [email or web address] and access using universal [place] password for that web site. If you do not have the password, please call the hospital operator.  02/24/2021, 4:45 PM

## 2021-02-24 NOTE — Plan of Care (Signed)
  Problem: Coping: Goal: Level of anxiety will decrease Outcome: Completed/Met   Problem: Pain Managment: Goal: General experience of comfort will improve Outcome: Completed/Met   Problem: Safety: Goal: Ability to remain free from injury will improve Outcome: Completed/Met   Problem: Skin Integrity: Goal: Risk for impaired skin integrity will decrease Outcome: Completed/Met   

## 2021-02-24 NOTE — Progress Notes (Addendum)
Found pt to be sweating and shaky.  BP (!) 119/52 (BP Location: Left Arm)   Pulse 86   Temp 99.3 F (37.4 C) (Oral)   Resp 17   Ht 5\' 2"  (1.575 m)   Wt 70.9 kg   SpO2 97%   BMI 28.59 kg/m  BS 106. Pt denies chest pain though he says a bit lightheadedness.  He complains Nauseous, reglan given as per mar. Unable to take PO meds At this time, will try later.   5/31 12:12 BS 136. He was irritated upon waking him up for sugar check and to follow up readiness for meds.Tried to give PO meds but  as per patient, not ready to take  meds. Advised to call once ready.

## 2021-02-24 NOTE — Evaluation (Signed)
Clinical/Bedside Swallow Evaluation Patient Details  Name: [name]. MRN: [id number] Date of Birth: 1951/06/23  Today's Date: 02/24/2021 Time: SLP Start Time (ACUTE ONLY): 1409 SLP Stop Time (ACUTE ONLY): 1433 SLP Time Calculation (min) (ACUTE ONLY): 24 min  Past Medical History:  Past Medical History:  Diagnosis Date  . Anxiety   . COVID-19 virus infection 09/2020  . CPAP (continuous positive airway pressure) dependence   . Diabetes mellitus without complication (HCC)   . GERD (gastroesophageal reflux disease)   . Hypertension   . Legally blind in left eye, as defined in [place]   . PTSD (post-traumatic stress disorder)   . PVD (peripheral vascular disease) (HCC) 01/17/2015  . Renal disorder   . Sleep apnea   . Thyroid disease    Past Surgical History:  Past Surgical History:  Procedure Laterality Date  . ABDOMINAL AORTAGRAM N/A 01/21/2015   Procedure: ABDOMINAL Ronny Flurry;  Surgeon: Chuck Hint, MD;  Location: [facility] CATH LAB;  Service: Cardiovascular;  Laterality: N/A;  . AMPUTATION Left 01/23/2015   Procedure: FIRST RAY AMPUTATION/LEFT FOOT;  Surgeon: Nadara Mustard, MD;  Location: [facility] OR;  Service: Orthopedics;  Laterality: Left;  . AMPUTATION Left 02/20/2015   Procedure: AMPUTATION BELOW KNEE;  Surgeon: Nadara Mustard, MD;  Location: [facility] OR;  Service: Orthopedics;  Laterality: Left;  . AV FISTULA PLACEMENT    . COLONOSCOPY    . COMBINED KIDNEY-PANCREAS TRANSPLANT Left 2009  . INCISION AND DRAINAGE ABSCESS Left 01/13/2015   Procedure: INCISION AND DRAINAGE OF LEFT FOOT FIRST RAY ABCESS;  Surgeon: Kathryne Hitch, MD;  Location: [facility] OR;  Service: Orthopedics;  Laterality: Left;  . LOWER EXTREMITY ANGIOGRAM Left 01/21/2015   Procedure: LOWER EXTREMITY ANGIOGRAM;  Surgeon: Chuck Hint, MD;  Location: [facility] CATH LAB;  Service: Cardiovascular;  Laterality: Left;   HPI:  Pt is a 70 yo male presenting with back pain, chills, and N/V. Pt found to have likely  cyclical vomiting syndrome with cannabis use, DM with probable gastroparesis, hypertensive urgency. Pt had recent MBS 02/13/21 at the [facility] revealing inconsistent epiglottic inversion and moderate pharyngeal residue primarily in the vallculae. Pt was recommended to have a soft diet/thin liquids and a trial of pharyngeal exercises. Pt was also noted to have residue in his distal esophagus with retrograde flow. PMH also includes: ACDF August 2020, kidney/pancreatic transplant, PAD s/p L BKA, thyroid disease, OSA, GERD, legally blind L eye   Assessment / Plan / Recommendation Clinical Impression  Pt's oropharyngeal swallowing appears to be grossly functional per clinical evaluation, although note results from recent MBS at outside facility that revealed no aspiration but moderate amounts of residue with solids. Strategies were reinforced with pt, primarily focusing on use of liquid wash. Pt says that he has been completing his swallowing exercises PTA, but that there has not been enough time to feel a difference yet. Pt describes his primary symptom to be "bad reflux" and says that at times when he has vomiting it appears to be stomach content without any evidence of food. For now, recommend continuing with a soft diet and thin liquids as was recommended per MBS. Would use esophageal precautions and liquid wash as well. May wish to consider further evaluation of potential esophageal and/or GI factors if symptoms persist. SLP will f/u for reinforcement of strategies and implementation of exercise program as initiated from outside facility. SLP Visit Diagnosis: Dysphagia, pharyngeal phase (R13.13)    Aspiration Risk  Mild aspiration risk    Diet  Recommendation Dysphagia 3 (Mech soft);Thin liquid   Liquid Administration via: Cup;Straw Medication Administration: Whole meds with liquid Supervision: Patient able to self feed;Intermittent supervision to cue for compensatory strategies Compensations: Slow  rate;Small sips/bites;Follow solids with liquid Postural Changes: Seated upright at 90 degrees;Remain upright for at least 30 minutes after po intake    Other  Recommendations Recommended Consults: Consider esophageal assessment;Consider GI evaluation Oral Care Recommendations: Oral care BID   Follow up Recommendations Outpatient SLP      Frequency and Duration min 2x/week  2 weeks       Prognosis Prognosis for Safe Diet Advancement: Fair Barriers to Reach Goals: Time post onset      Swallow Study   General HPI: Pt is a 70 yo male presenting with back pain, chills, and N/V. Pt found to have likely cyclical vomiting syndrome with cannabis use, DM with probable gastroparesis, hypertensive urgency. Pt had recent MBS 02/13/21 at the [facility] revealing inconsistent epiglottic inversion and moderate pharyngeal residue primarily in the vallculae. Pt was recommended to have a soft diet/thin liquids and a trial of pharyngeal exercises. Pt was also noted to have residue in his distal esophagus with retrograde flow. PMH also includes: ACDF August 2020, kidney/pancreatic transplant, PAD s/p L BKA, thyroid disease, OSA, GERD, legally blind L eye Type of Study: Bedside Swallow Evaluation Previous Swallow Assessment: see HPI Diet Prior to this Study: Dysphagia 3 (soft);Thin liquids Temperature Spikes Noted: No Respiratory Status: Room air History of Recent Intubation: No Behavior/Cognition: Alert;Cooperative;Pleasant mood Oral Cavity Assessment: Within Functional Limits Oral Care Completed by SLP: No Oral Cavity - Dentition: Adequate natural dentition Vision: Functional for self-feeding Self-Feeding Abilities: Able to feed self Patient Positioning: Upright in bed Baseline Vocal Quality: Normal Volitional Cough: Strong Volitional Swallow: Able to elicit    Oral/Motor/Sensory Function Overall Oral Motor/Sensory Function: Within functional limits   Ice Chips Ice chips: Not tested   Thin Liquid Thin  Liquid: Within functional limits Presentation: Cup;Self Fed;Straw    Nectar Thick Nectar Thick Liquid: Not tested   Honey Thick Honey Thick Liquid: Not tested   Puree Puree: Within functional limits Presentation: Self Fed;Spoon   Solid     Solid: Within functional limits Presentation: Self Fed      Mahala Menghini., M.A. CCC-SLP Acute Rehabilitation Services Pager [phone] Office [phone]  02/24/2021,3:03 PM

## 2021-02-25 ENCOUNTER — Inpatient Hospital Stay (HOSPITAL_COMMUNITY): Payer: No Typology Code available for payment source

## 2021-02-25 DIAGNOSIS — I160 Hypertensive urgency: Secondary | ICD-10-CM | POA: Diagnosis not present

## 2021-02-25 DIAGNOSIS — K219 Gastro-esophageal reflux disease without esophagitis: Secondary | ICD-10-CM

## 2021-02-25 DIAGNOSIS — R933 Abnormal findings on diagnostic imaging of other parts of digestive tract: Secondary | ICD-10-CM

## 2021-02-25 LAB — CBC WITH DIFFERENTIAL/PLATELET
Abs Immature Granulocytes: 0.12 10*3/uL — ABNORMAL HIGH (ref 0.00–0.07)
Basophils Absolute: 0 10*3/uL (ref 0.0–0.1)
Basophils Relative: 0 %
Eosinophils Absolute: 0 10*3/uL (ref 0.0–0.5)
Eosinophils Relative: 0 %
HCT: 47.5 % (ref 39.0–52.0)
Hemoglobin: 15.7 g/dL (ref 13.0–17.0)
Immature Granulocytes: 2 %
Lymphocytes Relative: 14 %
Lymphs Abs: 1 10*3/uL (ref 0.7–4.0)
MCH: 26.6 pg (ref 26.0–34.0)
MCHC: 33.1 g/dL (ref 30.0–36.0)
MCV: 80.4 fL (ref 80.0–100.0)
Monocytes Absolute: 0.8 10*3/uL (ref 0.1–1.0)
Monocytes Relative: 11 %
Neutro Abs: 5.2 10*3/uL (ref 1.7–7.7)
Neutrophils Relative %: 73 %
Platelets: 121 10*3/uL — ABNORMAL LOW (ref 150–400)
RBC: 5.91 MIL/uL — ABNORMAL HIGH (ref 4.22–5.81)
RDW: 14.6 % (ref 11.5–15.5)
WBC: 7.1 10*3/uL (ref 4.0–10.5)
nRBC: 0 % (ref 0.0–0.2)

## 2021-02-25 LAB — COMPREHENSIVE METABOLIC PANEL
ALT: 13 U/L (ref 0–44)
AST: 13 U/L — ABNORMAL LOW (ref 15–41)
Albumin: 4.4 g/dL (ref 3.5–5.0)
Alkaline Phosphatase: 82 U/L (ref 38–126)
Anion gap: 16 — ABNORMAL HIGH (ref 5–15)
BUN: 32 mg/dL — ABNORMAL HIGH (ref 8–23)
CO2: 18 mmol/L — ABNORMAL LOW (ref 22–32)
Calcium: 9.7 mg/dL (ref 8.9–10.3)
Chloride: 101 mmol/L (ref 98–111)
Creatinine, Ser: 1.31 mg/dL — ABNORMAL HIGH (ref 0.61–1.24)
GFR, Estimated: 59 mL/min — ABNORMAL LOW (ref >60)
Glucose, Bld: 150 mg/dL — ABNORMAL HIGH (ref 70–99)
Potassium: 4.1 mmol/L (ref 3.5–5.1)
Sodium: 135 mmol/L (ref 135–145)
Total Bilirubin: 0.8 mg/dL (ref 0.3–1.2)
Total Protein: 7.2 g/dL (ref 6.5–8.1)

## 2021-02-25 LAB — HEMOGLOBIN A1C
Hgb A1c MFr Bld: 6.9 % — ABNORMAL HIGH (ref 4.8–5.6)
Mean Plasma Glucose: 151 mg/dL

## 2021-02-25 LAB — GLUCOSE, CAPILLARY
Glucose-Capillary: 102 mg/dL — ABNORMAL HIGH (ref 70–99)
Glucose-Capillary: 133 mg/dL — ABNORMAL HIGH (ref 70–99)
Glucose-Capillary: 134 mg/dL — ABNORMAL HIGH (ref 70–99)
Glucose-Capillary: 140 mg/dL — ABNORMAL HIGH (ref 70–99)
Glucose-Capillary: 146 mg/dL — ABNORMAL HIGH (ref 70–99)
Glucose-Capillary: 204 mg/dL — ABNORMAL HIGH (ref 70–99)

## 2021-02-25 MED ORDER — LOPERAMIDE HCL 2 MG PO CAPS
2.0000 mg | ORAL_CAPSULE | ORAL | Status: DC | PRN
  Administered 2021-02-25: 2 mg via ORAL
  Filled 2021-02-25: qty 1

## 2021-02-25 MED ORDER — SODIUM CHLORIDE 0.9 % IV SOLN: INTRAVENOUS | Status: DC

## 2021-02-25 NOTE — Progress Notes (Signed)
Patient is requesting to shower and c/o of diarrhea. Dr. Arville Care paged for both concerns.

## 2021-02-25 NOTE — Progress Notes (Addendum)
PROGRESS NOTE   [name].  WUJ:[id number] DOB: [birth year] DOA: 02/22/2021 PCP: [name], MD  Brief Narrative:  70 year old black male DM TY 1+ renal/pancreatic transplant 2009 tacrolimus 5 twice daily Cannabis use with cyclical vomiting syndrome PAD status post left BKA  Presented [facility] 02/22/2021 back pain chills nausea vomiting Lactic acid 2.8 Found to be tachycardic hypertensive Rx Ativan Reglan Rx IV fluid hydration Seen by speech therapy- Barium swallow performed and showed possible obstruction GI ultimately consulted 5/31  Hospital-Problem based course Cyclical vomiting syndrome H/o cervical disc disease status post instrumentation resulting in dysphagia Barium swallow showing possible obstruction Patient not really eating any solids at this time Reviewed notes from [facility]-5/19-speech therapy saw the patient and told him to perform esophageal exercises Thin barium swallow shows obstruction which cannot pass a 13 mm tablet GI not emergently consulted for EGD 5/31 Continue Zofran Reglan etc. at this time Hypertensive urgency Continue amlodipine 10, labetalol 200 twice daily, Cozaar 50 daily As needed hydralazine 10 mg every 6 as needed blood pressure >170 DM TY 1 neuropathy nephropathy  Continue sliding scale coverage  Add back Lantus but at lower rate of 14 units as he is not really eating Changed p.o. Reglan in a.m. AKI  Kidney/pancreatic transplant Continue CellCept 1000 twice daily, Prograf 5 twice daily Monitor labs daily-they seem stable at this time his creatinine has worsened-start saline 50 cc/H-if it does not resolve-please call nephrologist and obtain Prograf and CellCept levels    DVT prophylaxis: Lovenox Code Status: Full Family Communication: Discussed with family at the bedside with still not really eating only 2 bites of sandwich  Disposition:  Status is: Observation  The patient will require care spanning > 2 midnights and  should be moved to inpatient because: Hemodynamically unstable, Altered mental status and Ongoing diagnostic testing needed not appropriate for outpatient work up  Dispo: The patient is from: Home              Anticipated d/c is to: Home              Patient currently is not medically stable to d/c.   Difficult to place patient No       Consultants:   None yet  Procedures: No  Antimicrobials:    Subjective:  Wants to go home Long discussion between himself myself and speech therapist-he relents to stay-he also is quite dehydrated    Objective: Vitals:   02/25/21 0753 02/25/21 0846 02/25/21 1145 02/25/21 1623  BP: (!) 185/83 (!) 186/83 122/60 (!) 148/71  Pulse: 100 100 83 87  Resp: 20  18 18   Temp: 98.3 F (36.8 C)  98 F (36.7 C) 97.9 F (36.6 C)  TempSrc: Oral  Oral Oral  SpO2: 96%  100% 100%  Weight:      Height:        Intake/Output Summary (Last 24 hours) at 02/25/2021 1636 Last data filed at 02/25/2021 1555 Gross per 24 hour  Intake 666.6 ml  Output 250 ml  Net 416.6 ml   Filed Weights   02/23/21 0130 02/24/21 0505 02/25/21 0400  Weight: 72.1 kg 70.9 kg 71.3 kg    Examination:  E l alert dry mucosa CTA B no added sound S1-S2 no murmur Abdomen soft no rebound No lower extremity edema  Data Reviewed: personally reviewed   CBC    Component Value Date/Time   WBC 7.1 02/25/2021 0345   RBC 5.91 (H) 02/25/2021 0345   HGB 15.7  02/25/2021 0345   HCT 47.5 02/25/2021 0345   PLT 121 (L) 02/25/2021 0345   MCV 80.4 02/25/2021 0345   MCH 26.6 02/25/2021 0345   MCHC 33.1 02/25/2021 0345   RDW 14.6 02/25/2021 0345   LYMPHSABS 1.0 02/25/2021 0345   MONOABS 0.8 02/25/2021 0345   EOSABS 0.0 02/25/2021 0345   BASOSABS 0.0 02/25/2021 0345   CMP Latest Ref Rng & Units 02/25/2021 02/24/2021 02/23/2021  Glucose 70 - 99 mg/dL 409(W) 119(J) 478(G)  BUN 8 - 23 mg/dL 95(A) 19 15  Creatinine 0.61 - 1.24 mg/dL 2.13(Y) 8.65 7.84  Sodium 135 - 145 mmol/L 135  134(L) 139  Potassium 3.5 - 5.1 mmol/L 4.1 3.9 4.1  Chloride 98 - 111 mmol/L 101 103 108  CO2 22 - 32 mmol/L 18(L) 19(L) 19(L)  Calcium 8.9 - 10.3 mg/dL 9.7 9.7 9.6  Total Protein 6.5 - 8.1 g/dL 7.2 7.3 -  Total Bilirubin 0.3 - 1.2 mg/dL 0.8 0.8 -  Alkaline Phos 38 - 126 U/L 82 83 -  AST 15 - 41 U/L 13(L) 17 -  ALT 0 - 44 U/L 13 11 -     Radiology Studies: DG ESOPHAGUS W SINGLE CM (SOL OR THIN BA)  Result Date: 02/25/2021 CLINICAL DATA:  Dysphagia. Additional history provided: Patient reports regurgitation of solid foods, discomfort in the region of the neck/throat during and after eating, ACDF 01/08/2020. EXAM: ESOPHOGRAM / BARIUM SWALLOW / BARIUM TABLET STUDY TECHNIQUE: Combined double contrast and single contrast examination performed using effervescent crystals, thick barium liquid, and thin barium liquid. The patient was observed with fluoroscopy swallowing a 13 mm barium sulphate tablet. FLUOROSCOPY TIME:  Fluoroscopy Time:  2 minutes, 54 seconds Radiation Exposure Index (if provided by the fluoroscopic device): 35.10 mGy. Number of Acquired Spot Images: None COMPARISON:  Chest radiograph 02/22/2021. FINDINGS: Circumferentially narrowed appearance of the distal esophagus, just above the level of a small to moderate-sized hiatal hernia. A swallowed 13 mm barium tablet did not pass beyond this level, despite a prolonged period of observation. The esophagus is otherwise normal in caliber and smooth in contour. Mild intermittent esophageal dysmotility. No gastroesophageal reflux was observed. IMPRESSION: Circumferentially narrowed appearance of the distal esophagus, just above the level of a small to moderate-sized hiatal hernia. A swallowed 13 mm barium tablet did not pass beyond this level, despite a prolonged period of observation. Findings are suspicious for an underlying distal esophageal stricture, and endoscopy should be considered for further evaluation. Mild intermittent esophageal  dysmotility. Otherwise unremarkable esophagram, as described. Electronically Signed   By: Jackey Loge DO   On: 02/25/2021 15:22     Scheduled Meds: . amLODipine  10 mg Oral Daily  . aspirin EC  81 mg Oral Daily  . atorvastatin  40 mg Oral QHS  . enoxaparin (LOVENOX) injection  40 mg Subcutaneous Q24H  . furosemide  20 mg Oral Daily  . insulin glargine  14 Units Subcutaneous QHS  . labetalol  200 mg Oral BID  . losartan  50 mg Oral Daily  . metoCLOPramide (REGLAN) injection  10 mg Intravenous Q12H  . mycophenolate  1,000 mg Oral BID  . pantoprazole  40 mg Oral Daily  . potassium chloride  20 mEq Oral Daily  . tacrolimus  5 mg Oral BID   Continuous Infusions: . sodium chloride 50 mL/hr at 02/25/21 0900     LOS: 2 days   Time spent: 22  Rhetta Mura, MD [facility] To contact the attending provider between  7A-7P or the covering provider during after hours 7P-7A, please log into the web site [email or web address] and access using universal [place] password for that web site. If you do not have the password, please call the hospital operator.  02/25/2021, 4:36 PM

## 2021-02-25 NOTE — H&P (View-Only) (Signed)
Referring Provider:  [facility]         Primary Care Physician:  [name], MD Primary Gastroenterologist:  Wendall Papa, MD                We were asked to see this patient for:      Abnormal esophagram          ASSESSMENT / PLAN:   # 70 yo male with intermittent dysphagia over the last year.  He says it started ~ 1 year ago following neck surgery though according to records he had the anterior cervical discectomy and fusion in August 2020. Esophagram today showsd distal esophageal narrowing impeding passage of barium tablet. --Will likely need EGD tomorrow, make NPO after MN. The risks and benefits of EGD with possible biopsies were discussed with the patient and he agrees to proceed.    # Hypertensive urgency on admission. Resolved.   # PVD, s/p L BKA  # Chronic thrombocytopenia. Etiology? Maybe medication related ( such as prograf?) No evidence for cirrhosis on CT scan.  Platelets stable in 120s.    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   [name]. is a 70 y.o. male with a past medical history significant for HTN, DM2, HLD, sleep apnea, gout pancreas and kidney transplant in 2009, PVD, former smoker.   Patient admitted 02/22/2021 for evaluation of nausea and vomiting as well as generalized abdominal pain and back pain.He says the N/V started on Saturday. He denies chronic nausea / vomiting.   In the ED his BP was markedly elevated. He was admitted for hypertensive urgency and further evaluation of N/V.  In the ED his glucose was 264, CMP otherwise unremarkable including liver test and lipase.  Except for thrombocytopenia his CBC was unremarkable.  Noncontrast CT negative for hepatobiliary findings.  No pancreatic findings.  Patient gives a history of  acid reflux but can not say for what length of time he has had it. He is asymptomatic on daily protonix at home. He reports intermittent solid food dysphagia for about one year. He feels like it started after neck surgery a year ago ( however it seems from records that neck surgery was in August 2020). He mainly has problems swallowing meat.   Patient has no other GI complaints. Bowel movements are normal. No blood in stool  Reports a normal colonoscopy at [facility] ~ 5 years ago. His weight has been stable. No FMH of esophageal or colon cancer as far as he knows.   PREVIOUS ENDOSCOPIC EVALUATIONS / PERTINENT STUDIES THIS ADMISSION   01/29/21 CT scan w/ contrast -No acute abdominopelvic abnormality. -Left pelvic transplant kidney without frank  hydronephrosis. -Cardiomegaly  02/22/21 Non contrast CT scan abd / pelvis --no acute findings.   02/22/21 Barium swallow  IMPRESSION: Circumferentially narrowed appearance of the distal esophagus, just above the level of a small to moderate-sized hiatal hernia. A swallowed 13 mm barium tablet did not pass beyond this level, despite a prolonged period of observation. Findings are suspicious for an underlying distal esophageal stricture, and endoscopy should be considered for further evaluation.  Mild intermittent esophageal dysmotility.  Otherwise unremarkable esophagram, as described. Bedside swallowing evaluation by SLP showed oropharyngeal swallowing to be grossly functional.    Past Medical History:  Diagnosis Date  . Anxiety   . COVID-19 virus infection 09/2020  . CPAP (continuous positive airway pressure) dependence   . Diabetes mellitus without complication (HCC)   . GERD (gastroesophageal reflux disease)   . Hypertension   . Legally blind in left eye, as [place] in [place]   . PTSD (post-traumatic stress disorder)   . PVD (peripheral vascular disease) (HCC) 01/17/2015  . Renal disorder   . Sleep apnea   . Thyroid disease     Past Surgical  History:  Procedure Laterality Date  . ABDOMINAL AORTAGRAM N/A 01/21/2015   Procedure: ABDOMINAL Ronny FlurryAORTAGRAM;  Surgeon: Chuck Hinthristopher S Dickson, MD;  [facility];  Service: Cardiovascular;  Laterality: N/A;  . AMPUTATION Left 01/23/2015   Procedure: FIRST RAY AMPUTATION/LEFT FOOT;  Surgeon: Nadara MustardMarcus Duda V, MD;  Location: [facility] OR;  Service: Orthopedics;  Laterality: Left;  . AMPUTATION Left 02/20/2015   Procedure: AMPUTATION BELOW KNEE;  Surgeon: Nadara MustardMarcus Duda V, MD;  Location: [facility] OR;  Service: Orthopedics;  Laterality: Left;  . AV FISTULA PLACEMENT    . COLONOSCOPY    . COMBINED KIDNEY-PANCREAS TRANSPLANT Left 2009  . INCISION AND DRAINAGE ABSCESS Left 01/13/2015   Procedure: INCISION AND DRAINAGE OF LEFT FOOT FIRST RAY ABCESS;  Surgeon: Kathryne Hitchhristopher Y Blackman, MD;  Location: [facility] OR;  Service: Orthopedics;  Laterality: Left;  . LOWER EXTREMITY ANGIOGRAM Left 01/21/2015   Procedure: LOWER EXTREMITY ANGIOGRAM;  Surgeon: Chuck Hinthristopher S Dickson, MD;  [facility];  Service: Cardiovascular;  Laterality: Left;    Prior to Admission medications   Medication Sig Start Date End Date Taking? Authorizing Provider  amLODipine (NORVASC) 10 MG tablet Take 10 mg by mouth daily.   Yes [provider]  aspirin EC 81 MG tablet Take 81 mg by mouth daily.   Yes [provider]  atorvastatin (LIPITOR) 80 MG tablet Take 40 mg by mouth at bedtime.    Yes [provider]  cyclobenzaprine (FLEXERIL) 5 MG tablet Take 1 tablet (5 mg total) by mouth 2 (two) times daily as needed for muscle spasms. 01/30/20  Yes Fawze, Mina A, PA-C  ergocalciferol (VITAMIN D2) 1.25 MG (50000 UT) capsule Take 50,000 Units by mouth every Wednesday. 05/22/15  Yes [provider]  furosemide (LASIX) 20 MG tablet Take 1 tablet (20 mg total) by mouth daily. 07/28/17  Yes Osvaldo ShipperKrishnan, Gokul, MD  insulin glargine (LANTUS SOLOSTAR) 100 UNIT/ML Solostar Pen Inject 28 Units into the skin daily. 12/04/20  Yes  Shamleffer, Konrad DoloresIbtehal Jaralla, MD  Insulin Pen Needle 32G X 4 MM MISC 1 Device by Does not apply route as directed. 12/04/20  Yes Shamleffer, Konrad DoloresIbtehal Jaralla, MD  labetalol (NORMODYNE) 100 MG tablet Take 2 tablets (200 mg total) by mouth 2 (two) times daily. 07/28/17  Yes Osvaldo ShipperKrishnan, Gokul, MD  lisinopril (PRINIVIL,ZESTRIL) 10 MG tablet Take 1 tablet (10 mg total) by mouth daily.  01/07/18  Yes Adhikari, Amrit, MD  losartan (COZAAR) 50 MG tablet Take 50 mg by mouth daily. 02/19/20  Yes [provider]  metFORMIN (GLUCOPHAGE-XR) 500 MG 24 hr tablet Take 1 tablet (500 mg total) by mouth 2 (two) times daily. 12/04/20  Yes Shamleffer, Ibtehal Jaralla, MD  mycophenolate (CELLCEPT) 250 MG capsule Take 1,000 mg by mouth 2 (two) times daily.   Yes [provider]  pantoprazole (PROTONIX) 40 MG tablet Take 1 tablet (40 mg total) by mouth daily. 07/28/17  Yes Krishnan, Gokul, MD  potassium chloride 20 MEQ TBCR Take 20 mEq by mouth daily. 01/07/18  Yes Adhikari, Amrit, MD  sildenafil (VIAGRA) 50 MG tablet Take 50 mg by mouth as needed. 02/29/20  Yes [provider]  tacrolimus (PROGRAF) 5 MG capsule Take 5 mg by mouth 2 (two) times daily.   Yes [provider]  traZODone (DESYREL) 50 MG tablet Take 50 mg by mouth at bedtime as needed for sleep.   Yes [provider]  metoCLOPramide (REGLAN) 5 MG tablet Take 1 tablet (5 mg total) by mouth 3 (three) times daily for 3 days. Patient not taking: Reported on 02/22/2021 10/20/17 01/05/26  Gherghe, Costin M, MD  ondansetron (ZOFRAN ODT) 8 MG disintegrating tablet Take 1 tablet (8 mg total) by mouth every 8 (eight) hours as needed for nausea. Patient not taking: No sig reported 09/28/20   Nanavati, Ankit, MD  ondansetron (ZOFRAN) 4 MG tablet Take 1 tablet (4 mg total) by mouth every 8 (eight) hours as needed for nausea or vomiting. Patient not taking: No sig reported 04/24/19   Knapp, Iva, MD  silver sulfADIAZINE (SILVADENE) 1 % cream Apply 1  application topically daily. Patient not taking: Reported on 02/22/2021 06/12/19   Nelson, Yvonne Sue, MD    Current Facility-Administered Medications  Medication Dose Route Frequency Provider Last Rate Last Admin  . 0.9 %  sodium chloride infusion   Intravenous Continuous Samtani, Jai-Gurmukh, MD 50 mL/hr at 02/25/21 0900 New Bag at 02/25/21 0900  . acetaminophen (TYLENOL) tablet 650 mg  650 mg Oral Q6H PRN Chotiner, Bradley S, MD       Or  . acetaminophen (TYLENOL) suppository 650 mg  650 mg Rectal Q6H PRN Chotiner, Bradley S, MD      . amLODipine (NORVASC) tablet 10 mg  10 mg Oral Daily Chotiner, Bradley S, MD   10 mg at 02/25/21 0846  . aspirin EC tablet 81 mg  81 mg Oral Daily Chotiner, Bradley S, MD   81 mg at 02/25/21 0846  . atorvastatin (LIPITOR) tablet 40 mg  40 mg Oral QHS Chotiner, Bradley S, MD   40 mg at 02/23/21 2311  . enoxaparin (LOVENOX) injection 40 mg  40 mg Subcutaneous Q24H Chotiner, Bradley S, MD   40 mg at 02/25/21 0639  . furosemide (LASIX) tablet 20 mg  20 mg Oral Daily Chotiner, Bradley S, MD   20 mg at 02/25/21 0846  . hydrALAZINE (APRESOLINE) injection 10 mg  10 mg Intravenous Q8H PRN Samtani, Jai-Gurmukh, MD      . insulin aspart (novoLOG) injection 0-9 Units  0-9 Units Subcutaneous Q6H PRN Chotiner, Bradley S, MD   2 Units at 02/25/21 0357  . insulin glargine (LANTUS) injection 14 Units  14 Units Subcutaneous QHS Samtani, Jai-Gurmukh, MD   14 Units at 02/23/21 2322  . labetalol (NORMODYNE) tablet 200 mg  200 mg Oral BID Chotiner, Bradley S, MD   200 mg at 02/25/21 0846  .   losartan (COZAAR) tablet 50 mg  50 mg Oral Daily Chotiner, Claudean Severance, MD   50 mg at 02/25/21 0846  . metoCLOPramide (REGLAN) injection 10 mg  10 mg Intravenous Q12H Rhetta Mura, MD   10 mg at 02/25/21 0850  . metoprolol tartrate (LOPRESSOR) injection 5 mg  5 mg Intravenous Q6H PRN Chotiner, Claudean Severance, MD   5 mg at 02/25/21 0403  . mycophenolate (CELLCEPT) capsule 1,000 mg  1,000 mg Oral  BID Chotiner, Claudean Severance, MD   1,000 mg at 02/25/21 0849  . ondansetron (ZOFRAN) tablet 4 mg  4 mg Oral Q6H PRN Chotiner, Claudean Severance, MD       Or  . ondansetron [facility]) injection 4 mg  4 mg Intravenous Q6H PRN Chotiner, Claudean Severance, MD   4 mg at 02/25/21 0403  . pantoprazole (PROTONIX) EC tablet 40 mg  40 mg Oral Daily Rhetta Mura, MD   40 mg at 02/25/21 0846  . potassium chloride (KLOR-CON) packet 20 mEq  20 mEq Oral Daily Chotiner, Claudean Severance, MD   20 mEq at 02/25/21 0849  . tacrolimus (PROGRAF) capsule 5 mg  5 mg Oral BID Chotiner, Claudean Severance, MD   5 mg at 02/25/21 0848  . traZODone (DESYREL) tablet 50 mg  50 mg Oral QHS PRN Chotiner, Claudean Severance, MD        Allergies as of 02/22/2021  . (No Known Allergies)    Family History  Problem Relation Age of Onset  . Hypertension Other     Social History   Socioeconomic History  . Marital status: Married    Spouse name: Not on file  . Number of children: Not on file  . Years of education: Not on file  . Highest education level: Not on file  Occupational History  . Not on file  Tobacco Use  . Smoking status: Former Games developer  . Smokeless tobacco: Never Used  . Tobacco comment: quit i [id number]  Substance and Sexual Activity  . Alcohol use: Yes    Comment: 1 beer rarely   . Drug use: No  . Sexual activity: Not on file  Other Topics Concern  . Not on file  Social History Narrative  . Not on file   Social Determinants of Health   Financial Resource Strain: Not on file  Food Insecurity: Not on file  Transportation Needs: Not on file  Physical Activity: Not on file  Stress: Not on file  Social Connections: Not on file  Intimate Partner Violence: Not on file    Review of Systems: All systems reviewed and negative except where noted in HPI.  OBJECTIVE:    Physical Exam: Vital signs in last 24 hours: Temp:  [97.9 F (36.6 C)-99.3 F (37.4 C)] 97.9 F (36.6 C) (05/31 1623) Pulse Rate:  [83-108] 87 (05/31 1623) Resp:   [17-20] 18 (05/31 1623) BP: (119-192)/(52-89) 148/71 (05/31 1623) SpO2:  [96 %-100 %] 100 % (05/31 1623) Weight:  [71.3 kg] 71.3 kg (05/31 0400)   General:   Alert  Well developed male in NAD Psych:  Pleasant, cooperative. Normal mood and affect. Eyes:  Pupils equal, sclera clear, no icterus.   Conjunctiva pink. Ears:  Normal auditory acuity. Nose:  No deformity, discharge,  or lesions. Neck:  Supple; no masses Lungs:  Clear throughout to auscultation.   No wheezes, crackles, or rhonchi.  Heart:  Regular rate and rhythm;  no lower extremity edema Abdomen:  Soft, non-distended, nontender, BS active, no palp mass  Rectal:  Deferred  Msk:  Symmetrical without gross deformities. . Neurologic:  Alert and  oriented x4;  grossly normal neurologically. Skin:  Intact without significant lesions or rashes.  Filed Weights   02/23/21 0130 02/24/21 0505 02/25/21 0400  Weight: 72.1 kg 70.9 kg 71.3 kg     Scheduled inpatient medications . amLODipine  10 mg Oral Daily  . aspirin EC  81 mg Oral Daily  . atorvastatin  40 mg Oral QHS  . enoxaparin (LOVENOX) injection  40 mg Subcutaneous Q24H  . furosemide  20 mg Oral Daily  . insulin glargine  14 Units Subcutaneous QHS  . labetalol  200 mg Oral BID  . losartan  50 mg Oral Daily  . metoCLOPramide (REGLAN) injection  10 mg Intravenous Q12H  . mycophenolate  1,000 mg Oral BID  . pantoprazole  40 mg Oral Daily  . potassium chloride  20 mEq Oral Daily  . tacrolimus  5 mg Oral BID      Intake/Output from previous day: 05/30 0701 - 05/31 0700 In: 880 [P.O.:880] Out: 300 [Urine:300] Intake/Output this shift: Total I/O In: 426.6 [P.O.:300; I.V.:126.6] Out: -    Lab Results: Recent Labs    02/23/21 0553 02/24/21 0203 02/25/21 0345  WBC 8.7 10.3 7.1  HGB 14.8 15.0 15.7  HCT 46.1 47.2 47.5  PLT 97* 107* 121*   BMET Recent Labs    02/23/21 0553 02/24/21 0203 02/25/21 0345  NA 139 134* 135  K 4.1 3.9 4.1  CL 108 103 101  CO2  19* 19* 18*  GLUCOSE 224* 143* 150*  BUN 15 19 32*  CREATININE 0.95 1.22 1.31*  CALCIUM 9.6 9.7 9.7   LFT Recent Labs    02/25/21 0345  PROT 7.2  ALBUMIN 4.4  AST 13*  ALT 13  ALKPHOS 82  BILITOT 0.8   PT/INR No results for input(s): LABPROT, INR in the last 72 hours. Hepatitis Panel No results for input(s): HEPBSAG, HCVAB, HEPAIGM, HEPBIGM in the last 72 hours.   . CBC Latest Ref Rng & Units 02/25/2021 02/24/2021 02/23/2021  WBC 4.0 - 10.5 K/uL 7.1 10.3 8.7  Hemoglobin 13.0 - 17.0 g/dL 42.5 95.6 38.7  Hematocrit 39.0 - 52.0 % 47.5 47.2 46.1  Platelets 150 - 400 K/uL 121(L) 107(L) 97(L)    . CMP Latest Ref Rng & Units 02/25/2021 02/24/2021 02/23/2021  Glucose 70 - 99 mg/dL 564(P) 329(J) 188(C)  BUN 8 - 23 mg/dL 16(S) 19 15  Creatinine 0.61 - 1.24 mg/dL 0.63(K) 1.60 1.09  Sodium 135 - 145 mmol/L 135 134(L) 139  Potassium 3.5 - 5.1 mmol/L 4.1 3.9 4.1  Chloride 98 - 111 mmol/L 101 103 108  CO2 22 - 32 mmol/L 18(L) 19(L) 19(L)  Calcium 8.9 - 10.3 mg/dL 9.7 9.7 9.6  Total Protein 6.5 - 8.1 g/dL 7.2 7.3 -  Total Bilirubin 0.3 - 1.2 mg/dL 0.8 0.8 -  Alkaline Phos 38 - 126 U/L 82 83 -  AST 15 - 41 U/L 13(L) 17 -  ALT 0 - 44 U/L 13 11 -   Studies/Results: DG ESOPHAGUS W SINGLE CM (SOL OR THIN BA)  Result Date: 02/25/2021 CLINICAL DATA:  Dysphagia. Additional history provided: Patient reports regurgitation of solid foods, discomfort in the region of the neck/throat during and after eating, ACDF 01/08/2020. EXAM: ESOPHOGRAM / BARIUM SWALLOW / BARIUM TABLET STUDY TECHNIQUE: Combined double contrast and single contrast examination performed using effervescent crystals, thick barium liquid, and thin barium liquid. The patient was observed  with fluoroscopy swallowing a 13 mm barium sulphate tablet. FLUOROSCOPY TIME:  Fluoroscopy Time:  2 minutes, 54 seconds Radiation Exposure Index (if provided by the fluoroscopic device): 35.10 mGy. Number of Acquired Spot Images: None COMPARISON:   Chest radiograph 02/22/2021. FINDINGS: Circumferentially narrowed appearance of the distal esophagus, just above the level of a small to moderate-sized hiatal hernia. A swallowed 13 mm barium tablet did not pass beyond this level, despite a prolonged period of observation. The esophagus is otherwise normal in caliber and smooth in contour. Mild intermittent esophageal dysmotility. No gastroesophageal reflux was observed. IMPRESSION: Circumferentially narrowed appearance of the distal esophagus, just above the level of a small to moderate-sized hiatal hernia. A swallowed 13 mm barium tablet did not pass beyond this level, despite a prolonged period of observation. Findings are suspicious for an underlying distal esophageal stricture, and endoscopy should be considered for further evaluation. Mild intermittent esophageal dysmotility. Otherwise unremarkable esophagram, as described. Electronically Signed   By: Jackey Loge DO   On: 02/25/2021 15:22    Principal Problem:   Hypertensive urgency Active Problems:   Peripheral vascular disease (HCC)   Intractable nausea and vomiting   Kidney transplant recipient   Type 2 diabetes mellitus with diabetic polyneuropathy, with long-term current use of insulin (HCC)   Pancreas transplant status (HCC)   Gastroparesis    Willette Cluster, NP-C @  02/25/2021, 4:46 PM  GI ATTENDING  History, labs, X-rays reviewed. Agree with comprehensive consultation note as outlined above. The patient has chronic GERD and intermittent solid food dysphagia which appears secondary to distal peptic stricture, on esophagram. Agree with plans for EGD with probable esophageal dilation (likely tomorrow). The nature of the procedure, as well as the risks, benefits, and alternatives were carefully and thoroughly reviewed with the patient. Ample time for discussion and questions allowed. The patient understood, was satisfied, and agreed to proceed.  Wilhemina Bonito. Eda Keys., M.D. [facility] Division of Gastroenterology

## 2021-02-25 NOTE — Progress Notes (Signed)
Patient refusing IVF. Adamant that it impedes him from freely going to the bathroom. Patient educated on rationale of IVF but he states "no, I don't want them". Dr. Arville Care notified via [facility] communication. Dr. Arville Care returned call promptly. No new orders.

## 2021-02-25 NOTE — Progress Notes (Signed)
  Speech Language Pathology Treatment: Dysphagia  Patient Details Name: [name]. MRN: [id number] DOB: 09-17-1951 Today's Date: 02/25/2021 Time: 6063-0160 SLP Time Calculation (min) (ACUTE ONLY): 22 min  Assessment / Plan / Recommendation Clinical Impression  Pt was seen this afternoon after esophagram, with report and images reviewed. Radiologist reports evidence concerning for an esophageal stricture, but significant residue also noted by this SLP throughout the pharynx. This was observed during recent OP MBS as well, but did seem to clear with thin liquids at that time. Question if the residuals could be more prominent today? Although no aspiration is observed on imaging available. Pt also has no overt s/s of aspiration with thin liquids and purees during clinical assessment. Pt is avoiding solid foods on his lunch tray, which has been consistent over the past few days per staff report. Pt also shares some fears that he has around eating solid foods.   As a result, recommending softening foods further so that pt may feel like he can more adequately masticate and swallow them, to see if this helps increase his intake too. MD also present during session and reports plans to reach out to GI for consult (although pt would also like to see if he is able to get into OP GI through the [place] soon after discharge). SLP will adjust diet to Dyds 2 (finely chopped) solids and thin liquids pending additional GI work up. Question if some of his pharyngeal dysphagia could be secondary to primary esophageal deficits - may be appropriate to repeat MBS after GI intervention. Pt was educated on additional strategies and precautions to implement in the meantime.    HPI HPI: Pt is a 70 yo male presenting with back pain, chills, and N/V. Pt found to have likely cyclical vomiting syndrome with cannabis use, DM with probable gastroparesis, hypertensive urgency. Pt had recent MBS 02/13/21 at the [facility] revealing  inconsistent epiglottic inversion and moderate pharyngeal residue primarily in the vallculae. Pt was recommended to have a soft diet/thin liquids and a trial of pharyngeal exercises. Pt was also noted to have residue in his distal esophagus with retrograde flow. PMH also includes: ACDF August 2020, kidney/pancreatic transplant, PAD s/p L BKA, thyroid disease, OSA, GERD, legally blind L eye      SLP Plan  Continue with current plan of care       Recommendations  Diet recommendations: Dysphagia 1 (puree);Thin liquid Liquids provided via: Cup;Straw Medication Administration: Crushed with puree Supervision: Patient able to self feed;Intermittent supervision to cue for compensatory strategies Compensations: Slow rate;Small sips/bites;Follow solids with liquid Postural Changes and/or Swallow Maneuvers: Seated upright 90 degrees;Upright 30-60 min after meal                Oral Care Recommendations: Oral care BID Follow up Recommendations: Outpatient SLP SLP Visit Diagnosis: Dysphagia, pharyngeal phase (R13.13) Plan: Continue with current plan of care       GO                Mahala Menghini., M.A. CCC-SLP Acute Rehabilitation Services Pager [phone] Office [phone]  02/25/2021, 4:11 PM

## 2021-02-25 NOTE — Plan of Care (Signed)
  Problem: Health Behavior/Discharge Planning: Goal: Ability to manage health-related needs will improve Outcome: Progressing   Problem: Clinical Measurements: Goal: Ability to maintain clinical measurements within normal limits will improve Outcome: Progressing Goal: Will remain free from infection Outcome: Progressing Goal: Diagnostic test results will improve Outcome: Progressing Goal: Respiratory complications will improve Outcome: Progressing Goal: Cardiovascular complication will be avoided Outcome: Progressing   Problem: Activity: Goal: Risk for activity intolerance will decrease Outcome: Progressing   Problem: Nutrition: Goal: Adequate nutrition will be maintained Outcome: Progressing   Problem: Elimination: Goal: Will not experience complications related to bowel motility Outcome: Progressing Goal: Will not experience complications related to urinary retention Outcome: Progressing   

## 2021-02-25 NOTE — Consult Note (Addendum)
Referring Provider:  [facility]         Primary Care Physician:  [name], MD Primary Gastroenterologist:  Wendall Papa, MD                We were asked to see this patient for:      Abnormal esophagram          ASSESSMENT / PLAN:   # 70 yo male with intermittent dysphagia over the last year.  He says it started ~ 1 year ago following neck surgery though according to records he had the anterior cervical discectomy and fusion in August 2020. Esophagram today showsd distal esophageal narrowing impeding passage of barium tablet. --Will likely need EGD tomorrow, make NPO after MN. The risks and benefits of EGD with possible biopsies were discussed with the patient and he agrees to proceed.    # Hypertensive urgency on admission. Resolved.   # PVD, s/p L BKA  # Chronic thrombocytopenia. Etiology? Maybe medication related ( such as prograf?) No evidence for cirrhosis on CT scan.  Platelets stable in 120s.    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   [name]. is a 70 y.o. male with a past medical history significant for HTN, DM2, HLD, sleep apnea, gout pancreas and kidney transplant in 2009, PVD, former smoker.   Patient admitted 02/22/2021 for evaluation of nausea and vomiting as well as generalized abdominal pain and back pain.He says the N/V started on Saturday. He denies chronic nausea / vomiting.   In the ED his BP was markedly elevated. He was admitted for hypertensive urgency and further evaluation of N/V.  In the ED his glucose was 264, CMP otherwise unremarkable including liver test and lipase.  Except for thrombocytopenia his CBC was unremarkable.  Noncontrast CT negative for hepatobiliary findings.  No pancreatic findings.  Patient gives a history of  acid reflux but can not say for what length of time he has had it. He is asymptomatic on daily protonix at home. He reports intermittent solid food dysphagia for about one year. He feels like it started after neck surgery a year ago ( however it seems from records that neck surgery was in August 2020). He mainly has problems swallowing meat.   Patient has no other GI complaints. Bowel movements are normal. No blood in stool  Reports a normal colonoscopy at [facility] ~ 5 years ago. His weight has been stable. No FMH of esophageal or colon cancer as far as he knows.   PREVIOUS ENDOSCOPIC EVALUATIONS / PERTINENT STUDIES THIS ADMISSION   01/29/21 CT scan w/ contrast -No acute abdominopelvic abnormality. -Left pelvic transplant kidney without frank  hydronephrosis. -Cardiomegaly  02/22/21 Non contrast CT scan abd / pelvis --no acute findings.   02/22/21 Barium swallow  IMPRESSION: Circumferentially narrowed appearance of the distal esophagus, just above the level of a small to moderate-sized hiatal hernia. A swallowed 13 mm barium tablet did not pass beyond this level, despite a prolonged period of observation. Findings are suspicious for an underlying distal esophageal stricture, and endoscopy should be considered for further evaluation.  Mild intermittent esophageal dysmotility.  Otherwise unremarkable esophagram, as described. Bedside swallowing evaluation by SLP showed oropharyngeal swallowing to be grossly functional.    Past Medical History:  Diagnosis Date  . Anxiety   . COVID-19 virus infection 09/2020  . CPAP (continuous positive airway pressure) dependence   . Diabetes mellitus without complication (HCC)   . GERD (gastroesophageal reflux disease)   . Hypertension   . Legally blind in left eye, as [place]   . PTSD (post-traumatic stress disorder)   . PVD (peripheral vascular disease) (HCC) 01/17/2015  . Renal disorder   . Sleep apnea   . Thyroid disease     Past Surgical  History:  Procedure Laterality Date  . ABDOMINAL AORTAGRAM N/A 01/21/2015   Procedure: ABDOMINAL Ronny FlurryAORTAGRAM;  Surgeon: Chuck Hinthristopher S Dickson, MD;  [facility];  Service: Cardiovascular;  Laterality: N/A;  . AMPUTATION Left 01/23/2015   Procedure: FIRST RAY AMPUTATION/LEFT FOOT;  Surgeon: Nadara MustardMarcus Duda V, MD;  Location: [facility] OR;  Service: Orthopedics;  Laterality: Left;  . AMPUTATION Left 02/20/2015   Procedure: AMPUTATION BELOW KNEE;  Surgeon: Nadara MustardMarcus Duda V, MD;  Location: [facility] OR;  Service: Orthopedics;  Laterality: Left;  . AV FISTULA PLACEMENT    . COLONOSCOPY    . COMBINED KIDNEY-PANCREAS TRANSPLANT Left 2009  . INCISION AND DRAINAGE ABSCESS Left 01/13/2015   Procedure: INCISION AND DRAINAGE OF LEFT FOOT FIRST RAY ABCESS;  Surgeon: Kathryne Hitchhristopher Y Blackman, MD;  Location: [facility] OR;  Service: Orthopedics;  Laterality: Left;  . LOWER EXTREMITY ANGIOGRAM Left 01/21/2015   Procedure: LOWER EXTREMITY ANGIOGRAM;  Surgeon: Chuck Hinthristopher S Dickson, MD;  [facility];  Service: Cardiovascular;  Laterality: Left;    Prior to Admission medications   Medication Sig Start Date End Date Taking? Authorizing Provider  amLODipine (NORVASC) 10 MG tablet Take 10 mg by mouth daily.   Yes [provider]  aspirin EC 81 MG tablet Take 81 mg by mouth daily.   Yes [provider]  atorvastatin (LIPITOR) 80 MG tablet Take 40 mg by mouth at bedtime.    Yes [provider]  cyclobenzaprine (FLEXERIL) 5 MG tablet Take 1 tablet (5 mg total) by mouth 2 (two) times daily as needed for muscle spasms. 01/30/20  Yes Fawze, Mina A, PA-C  ergocalciferol (VITAMIN D2) 1.25 MG (50000 UT) capsule Take 50,000 Units by mouth every Wednesday. 05/22/15  Yes [provider]  furosemide (LASIX) 20 MG tablet Take 1 tablet (20 mg total) by mouth daily. 07/28/17  Yes Osvaldo ShipperKrishnan, Gokul, MD  insulin glargine (LANTUS SOLOSTAR) 100 UNIT/ML Solostar Pen Inject 28 Units into the skin daily. 12/04/20  Yes  Shamleffer, Konrad DoloresIbtehal Jaralla, MD  Insulin Pen Needle 32G X 4 MM MISC 1 Device by Does not apply route as directed. 12/04/20  Yes Shamleffer, Konrad DoloresIbtehal Jaralla, MD  labetalol (NORMODYNE) 100 MG tablet Take 2 tablets (200 mg total) by mouth 2 (two) times daily. 07/28/17  Yes Osvaldo ShipperKrishnan, Gokul, MD  lisinopril (PRINIVIL,ZESTRIL) 10 MG tablet Take 1 tablet (10 mg total) by mouth daily.  01/07/18  Yes Burnadette Pop, MD  losartan (COZAAR) 50 MG tablet Take 50 mg by mouth daily. 02/19/20  Yes [provider]  metFORMIN (GLUCOPHAGE-XR) 500 MG 24 hr tablet Take 1 tablet (500 mg total) by mouth 2 (two) times daily. 12/04/20  Yes Shamleffer, Konrad Dolores, MD  mycophenolate (CELLCEPT) 250 MG capsule Take 1,000 mg by mouth 2 (two) times daily.   Yes [provider]  pantoprazole (PROTONIX) 40 MG tablet Take 1 tablet (40 mg total) by mouth daily. 07/28/17  Yes Osvaldo Shipper, MD  potassium chloride 20 MEQ TBCR Take 20 mEq by mouth daily. 01/07/18  Yes Burnadette Pop, MD  sildenafil (VIAGRA) 50 MG tablet Take 50 mg by mouth as needed. 02/29/20  Yes [provider]  tacrolimus (PROGRAF) 5 MG capsule Take 5 mg by mouth 2 (two) times daily.   Yes [provider]  traZODone (DESYREL) 50 MG tablet Take 50 mg by mouth at bedtime as needed for sleep.   Yes [provider]  metoCLOPramide (REGLAN) 5 MG tablet Take 1 tablet (5 mg total) by mouth 3 (three) times daily for 3 days. Patient not taking: Reported on 02/22/2021 10/20/17 01/05/26  Leatha Gilding, MD  ondansetron (ZOFRAN ODT) 8 MG disintegrating tablet Take 1 tablet (8 mg total) by mouth every 8 (eight) hours as needed for nausea. Patient not taking: No sig reported 09/28/20   Derwood Kaplan, MD  ondansetron (ZOFRAN) 4 MG tablet Take 1 tablet (4 mg total) by mouth every 8 (eight) hours as needed for nausea or vomiting. Patient not taking: No sig reported 04/24/19   Devoria Albe, MD  silver sulfADIAZINE (SILVADENE) 1 % cream Apply 1  application topically daily. Patient not taking: Reported on 02/22/2021 06/12/19   Eustace Moore, MD    Current Facility-Administered Medications  Medication Dose Route Frequency Provider Last Rate Last Admin  . 0.9 %  sodium chloride infusion   Intravenous Continuous Rhetta Mura, MD 50 mL/hr at 02/25/21 0900 New Bag at 02/25/21 0900  . acetaminophen (TYLENOL) tablet 650 mg  650 mg Oral Q6H PRN Chotiner, Claudean Severance, MD       Or  . acetaminophen (TYLENOL) suppository 650 mg  650 mg Rectal Q6H PRN Chotiner, Claudean Severance, MD      . amLODipine (NORVASC) tablet 10 mg  10 mg Oral Daily Chotiner, Claudean Severance, MD   10 mg at 02/25/21 0846  . aspirin EC tablet 81 mg  81 mg Oral Daily Chotiner, Claudean Severance, MD   81 mg at 02/25/21 0846  . atorvastatin (LIPITOR) tablet 40 mg  40 mg Oral QHS Chotiner, Claudean Severance, MD   40 mg at 02/23/21 2311  . enoxaparin (LOVENOX) injection 40 mg  40 mg Subcutaneous Q24H Chotiner, Claudean Severance, MD   40 mg at 02/25/21 1610  . furosemide (LASIX) tablet 20 mg  20 mg Oral Daily Chotiner, Claudean Severance, MD   20 mg at 02/25/21 0846  . hydrALAZINE (APRESOLINE) injection 10 mg  10 mg Intravenous Q8H PRN Rhetta Mura, MD      . insulin aspart (novoLOG) injection 0-9 Units  0-9 Units Subcutaneous Q6H PRN Chotiner, Claudean Severance, MD   2 Units at 02/25/21 0357  . insulin glargine (LANTUS) injection 14 Units  14 Units Subcutaneous QHS Rhetta Mura, MD   14 Units at 02/23/21 2322  . labetalol (NORMODYNE) tablet 200 mg  200 mg Oral BID Chotiner, Claudean Severance, MD   200 mg at 02/25/21 0846  .  losartan (COZAAR) tablet 50 mg  50 mg Oral Daily Chotiner, Claudean Severance, MD   50 mg at 02/25/21 0846  . metoCLOPramide (REGLAN) injection 10 mg  10 mg Intravenous Q12H Rhetta Mura, MD   10 mg at 02/25/21 0850  . metoprolol tartrate (LOPRESSOR) injection 5 mg  5 mg Intravenous Q6H PRN Chotiner, Claudean Severance, MD   5 mg at 02/25/21 0403  . mycophenolate (CELLCEPT) capsule 1,000 mg  1,000 mg Oral  BID Chotiner, Claudean Severance, MD   1,000 mg at 02/25/21 0849  . ondansetron (ZOFRAN) tablet 4 mg  4 mg Oral Q6H PRN Chotiner, Claudean Severance, MD       Or  . ondansetron [facility]) injection 4 mg  4 mg Intravenous Q6H PRN Chotiner, Claudean Severance, MD   4 mg at 02/25/21 0403  . pantoprazole (PROTONIX) EC tablet 40 mg  40 mg Oral Daily Rhetta Mura, MD   40 mg at 02/25/21 0846  . potassium chloride (KLOR-CON) packet 20 mEq  20 mEq Oral Daily Chotiner, Claudean Severance, MD   20 mEq at 02/25/21 0849  . tacrolimus (PROGRAF) capsule 5 mg  5 mg Oral BID Chotiner, Claudean Severance, MD   5 mg at 02/25/21 0848  . traZODone (DESYREL) tablet 50 mg  50 mg Oral QHS PRN Chotiner, Claudean Severance, MD        Allergies as of 02/22/2021  . (No Known Allergies)    Family History  Problem Relation Age of Onset  . Hypertension Other     Social History   Socioeconomic History  . Marital status: Married    Spouse name: Not on file  . Number of children: Not on file  . Years of education: Not on file  . Highest education level: Not on file  Occupational History  . Not on file  Tobacco Use  . Smoking status: Former Games developer  . Smokeless tobacco: Never Used  . Tobacco comment: quit i [id number]  Substance and Sexual Activity  . Alcohol use: Yes    Comment: 1 beer rarely   . Drug use: No  . Sexual activity: Not on file  Other Topics Concern  . Not on file  Social History Narrative  . Not on file   Social Determinants of Health   Financial Resource Strain: Not on file  Food Insecurity: Not on file  Transportation Needs: Not on file  Physical Activity: Not on file  Stress: Not on file  Social Connections: Not on file  Intimate Partner Violence: Not on file    Review of Systems: All systems reviewed and negative except where noted in HPI.  OBJECTIVE:    Physical Exam: Vital signs in last 24 hours: Temp:  [97.9 F (36.6 C)-99.3 F (37.4 C)] 97.9 F (36.6 C) (05/31 1623) Pulse Rate:  [83-108] 87 (05/31 1623) Resp:   [17-20] 18 (05/31 1623) BP: (119-192)/(52-89) 148/71 (05/31 1623) SpO2:  [96 %-100 %] 100 % (05/31 1623) Weight:  [71.3 kg] 71.3 kg (05/31 0400)   General:   Alert  Well developed male in NAD Psych:  Pleasant, cooperative. Normal mood and affect. Eyes:  Pupils equal, sclera clear, no icterus.   Conjunctiva pink. Ears:  Normal auditory acuity. Nose:  No deformity, discharge,  or lesions. Neck:  Supple; no masses Lungs:  Clear throughout to auscultation.   No wheezes, crackles, or rhonchi.  Heart:  Regular rate and rhythm;  no lower extremity edema Abdomen:  Soft, non-distended, nontender, BS active, no palp mass  Rectal:  Deferred  Msk:  Symmetrical without gross deformities. . Neurologic:  Alert and  oriented x4;  grossly normal neurologically. Skin:  Intact without significant lesions or rashes.  Filed Weights   02/23/21 0130 02/24/21 0505 02/25/21 0400  Weight: 72.1 kg 70.9 kg 71.3 kg     Scheduled inpatient medications . amLODipine  10 mg Oral Daily  . aspirin EC  81 mg Oral Daily  . atorvastatin  40 mg Oral QHS  . enoxaparin (LOVENOX) injection  40 mg Subcutaneous Q24H  . furosemide  20 mg Oral Daily  . insulin glargine  14 Units Subcutaneous QHS  . labetalol  200 mg Oral BID  . losartan  50 mg Oral Daily  . metoCLOPramide (REGLAN) injection  10 mg Intravenous Q12H  . mycophenolate  1,000 mg Oral BID  . pantoprazole  40 mg Oral Daily  . potassium chloride  20 mEq Oral Daily  . tacrolimus  5 mg Oral BID      Intake/Output from previous day: 05/30 0701 - 05/31 0700 In: 880 [P.O.:880] Out: 300 [Urine:300] Intake/Output this shift: Total I/O In: 426.6 [P.O.:300; I.V.:126.6] Out: -    Lab Results: Recent Labs    02/23/21 0553 02/24/21 0203 02/25/21 0345  WBC 8.7 10.3 7.1  HGB 14.8 15.0 15.7  HCT 46.1 47.2 47.5  PLT 97* 107* 121*   BMET Recent Labs    02/23/21 0553 02/24/21 0203 02/25/21 0345  NA 139 134* 135  K 4.1 3.9 4.1  CL 108 103 101  CO2  19* 19* 18*  GLUCOSE 224* 143* 150*  BUN 15 19 32*  CREATININE 0.95 1.22 1.31*  CALCIUM 9.6 9.7 9.7   LFT Recent Labs    02/25/21 0345  PROT 7.2  ALBUMIN 4.4  AST 13*  ALT 13  ALKPHOS 82  BILITOT 0.8   PT/INR No results for input(s): LABPROT, INR in the last 72 hours. Hepatitis Panel No results for input(s): HEPBSAG, HCVAB, HEPAIGM, HEPBIGM in the last 72 hours.   . CBC Latest Ref Rng & Units 02/25/2021 02/24/2021 02/23/2021  WBC 4.0 - 10.5 K/uL 7.1 10.3 8.7  Hemoglobin 13.0 - 17.0 g/dL 42.5 95.6 38.7  Hematocrit 39.0 - 52.0 % 47.5 47.2 46.1  Platelets 150 - 400 K/uL 121(L) 107(L) 97(L)    . CMP Latest Ref Rng & Units 02/25/2021 02/24/2021 02/23/2021  Glucose 70 - 99 mg/dL 564(P) 329(J) 188(C)  BUN 8 - 23 mg/dL 16(S) 19 15  Creatinine 0.61 - 1.24 mg/dL 0.63(K) 1.60 1.09  Sodium 135 - 145 mmol/L 135 134(L) 139  Potassium 3.5 - 5.1 mmol/L 4.1 3.9 4.1  Chloride 98 - 111 mmol/L 101 103 108  CO2 22 - 32 mmol/L 18(L) 19(L) 19(L)  Calcium 8.9 - 10.3 mg/dL 9.7 9.7 9.6  Total Protein 6.5 - 8.1 g/dL 7.2 7.3 -  Total Bilirubin 0.3 - 1.2 mg/dL 0.8 0.8 -  Alkaline Phos 38 - 126 U/L 82 83 -  AST 15 - 41 U/L 13(L) 17 -  ALT 0 - 44 U/L 13 11 -   Studies/Results: DG ESOPHAGUS W SINGLE CM (SOL OR THIN BA)  Result Date: 02/25/2021 CLINICAL DATA:  Dysphagia. Additional history provided: Patient reports regurgitation of solid foods, discomfort in the region of the neck/throat during and after eating, ACDF 01/08/2020. EXAM: ESOPHOGRAM / BARIUM SWALLOW / BARIUM TABLET STUDY TECHNIQUE: Combined double contrast and single contrast examination performed using effervescent crystals, thick barium liquid, and thin barium liquid. The patient was observed  with fluoroscopy swallowing a 13 mm barium sulphate tablet. FLUOROSCOPY TIME:  Fluoroscopy Time:  2 minutes, 54 seconds Radiation Exposure Index (if provided by the fluoroscopic device): 35.10 mGy. Number of Acquired Spot Images: None COMPARISON:   Chest radiograph 02/22/2021. FINDINGS: Circumferentially narrowed appearance of the distal esophagus, just above the level of a small to moderate-sized hiatal hernia. A swallowed 13 mm barium tablet did not pass beyond this level, despite a prolonged period of observation. The esophagus is otherwise normal in caliber and smooth in contour. Mild intermittent esophageal dysmotility. No gastroesophageal reflux was observed. IMPRESSION: Circumferentially narrowed appearance of the distal esophagus, just above the level of a small to moderate-sized hiatal hernia. A swallowed 13 mm barium tablet did not pass beyond this level, despite a prolonged period of observation. Findings are suspicious for an underlying distal esophageal stricture, and endoscopy should be considered for further evaluation. Mild intermittent esophageal dysmotility. Otherwise unremarkable esophagram, as described. Electronically Signed   By: Jackey Loge DO   On: 02/25/2021 15:22    Principal Problem:   Hypertensive urgency Active Problems:   Peripheral vascular disease (HCC)   Intractable nausea and vomiting   Kidney transplant recipient   Type 2 diabetes mellitus with diabetic polyneuropathy, with long-term current use of insulin (HCC)   Pancreas transplant status (HCC)   Gastroparesis    [name] Cluster, NP-C @  02/25/2021, 4:46 PM  GI ATTENDING  History, labs, X-rays reviewed. Agree with comprehensive consultation note as outlined above. The patient has chronic GERD and intermittent solid food dysphagia which appears secondary to distal peptic stricture, on esophagram. Agree with plans for EGD with probable esophageal dilation (likely tomorrow). The nature of the procedure, as well as the risks, benefits, and alternatives were carefully and thoroughly reviewed with the patient. Ample time for discussion and questions allowed. The patient understood, was satisfied, and agreed to proceed.  Wilhemina Bonito. Eda Keys., M.D. [facility] Division of Gastroenterology

## 2021-02-26 ENCOUNTER — Inpatient Hospital Stay (HOSPITAL_COMMUNITY): Payer: No Typology Code available for payment source | Admitting: Anesthesiology

## 2021-02-26 ENCOUNTER — Encounter (HOSPITAL_COMMUNITY)
Admission: EM | Disposition: A | Discharge status: Home / Self Care | Payer: Self-pay | Source: Home / Self Care | Attending: Family Medicine

## 2021-02-26 ENCOUNTER — Encounter (HOSPITAL_COMMUNITY): Payer: Self-pay | Admitting: Family Medicine

## 2021-02-26 DIAGNOSIS — I160 Hypertensive urgency: Secondary | ICD-10-CM

## 2021-02-26 DIAGNOSIS — R1319 Other dysphagia: Secondary | ICD-10-CM

## 2021-02-26 DIAGNOSIS — I10 Essential (primary) hypertension: Secondary | ICD-10-CM

## 2021-02-26 DIAGNOSIS — R112 Nausea with vomiting, unspecified: Secondary | ICD-10-CM

## 2021-02-26 DIAGNOSIS — Z794 Long term (current) use of insulin: Secondary | ICD-10-CM

## 2021-02-26 DIAGNOSIS — E1142 Type 2 diabetes mellitus with diabetic polyneuropathy: Secondary | ICD-10-CM

## 2021-02-26 HISTORY — PX: ESOPHAGOGASTRODUODENOSCOPY (EGD) WITH PROPOFOL: SHX5813

## 2021-02-26 HISTORY — PX: BALLOON DILATION: SHX5330

## 2021-02-26 HISTORY — PX: BIOPSY: SHX5522

## 2021-02-26 LAB — COMPREHENSIVE METABOLIC PANEL
ALT: 10 U/L (ref 0–44)
AST: 11 U/L — ABNORMAL LOW (ref 15–41)
Albumin: 4 g/dL (ref 3.5–5.0)
Alkaline Phosphatase: 70 U/L (ref 38–126)
Anion gap: 13 (ref 5–15)
BUN: 37 mg/dL — ABNORMAL HIGH (ref 8–23)
CO2: 20 mmol/L — ABNORMAL LOW (ref 22–32)
Calcium: 9.5 mg/dL (ref 8.9–10.3)
Chloride: 101 mmol/L (ref 98–111)
Creatinine, Ser: 1.38 mg/dL — ABNORMAL HIGH (ref 0.61–1.24)
GFR, Estimated: 56 mL/min — ABNORMAL LOW (ref >60)
Glucose, Bld: 92 mg/dL (ref 70–99)
Potassium: 3.7 mmol/L (ref 3.5–5.1)
Sodium: 134 mmol/L — ABNORMAL LOW (ref 135–145)
Total Bilirubin: 0.6 mg/dL (ref 0.3–1.2)
Total Protein: 6.5 g/dL (ref 6.5–8.1)

## 2021-02-26 LAB — CBC WITH DIFFERENTIAL/PLATELET
Abs Immature Granulocytes: 0.04 10*3/uL (ref 0.00–0.07)
Basophils Absolute: 0 10*3/uL (ref 0.0–0.1)
Basophils Relative: 0 %
Eosinophils Absolute: 0 10*3/uL (ref 0.0–0.5)
Eosinophils Relative: 0 %
HCT: 45.4 % (ref 39.0–52.0)
Hemoglobin: 15 g/dL (ref 13.0–17.0)
Immature Granulocytes: 1 %
Lymphocytes Relative: 16 %
Lymphs Abs: 0.8 10*3/uL (ref 0.7–4.0)
MCH: 26.3 pg (ref 26.0–34.0)
MCHC: 33 g/dL (ref 30.0–36.0)
MCV: 79.6 fL — ABNORMAL LOW (ref 80.0–100.0)
Monocytes Absolute: 0.8 10*3/uL (ref 0.1–1.0)
Monocytes Relative: 17 %
Neutro Abs: 3.3 10*3/uL (ref 1.7–7.7)
Neutrophils Relative %: 66 %
Platelets: 113 10*3/uL — ABNORMAL LOW (ref 150–400)
RBC: 5.7 MIL/uL (ref 4.22–5.81)
RDW: 14.3 % (ref 11.5–15.5)
WBC: 5 10*3/uL (ref 4.0–10.5)
nRBC: 0 % (ref 0.0–0.2)

## 2021-02-26 LAB — TACROLIMUS LEVEL: Tacrolimus (FK506) - LabCorp: 4.3 ng/mL (ref 2.0–20.0)

## 2021-02-26 LAB — GLUCOSE, CAPILLARY
Glucose-Capillary: 93 mg/dL (ref 70–99)
Glucose-Capillary: 81 mg/dL (ref 70–99)
Glucose-Capillary: 165 mg/dL — ABNORMAL HIGH (ref 70–99)

## 2021-02-26 SURGERY — ESOPHAGOGASTRODUODENOSCOPY (EGD) WITH PROPOFOL
Anesthesia: Monitor Anesthesia Care

## 2021-02-26 MED ORDER — SODIUM CHLORIDE 0.9 % IV SOLN: INTRAVENOUS | Status: DC

## 2021-02-26 MED ORDER — ONDANSETRON 4 MG PO TBDP
8.0000 mg | ORAL_TABLET | Freq: Three times a day (TID) | ORAL | Status: DC | PRN
  Filled 2021-02-26: qty 2

## 2021-02-26 MED ORDER — PROPOFOL 10 MG/ML IV BOLUS
INTRAVENOUS | Status: DC | PRN
  Administered 2021-02-26: 20 mg via INTRAVENOUS
  Administered 2021-02-26: 15 mg via INTRAVENOUS

## 2021-02-26 MED ORDER — LIDOCAINE 2% (20 MG/ML) 5 ML SYRINGE
INTRAMUSCULAR | Status: DC | PRN
  Administered 2021-02-26: 70 mg via INTRAVENOUS

## 2021-02-26 MED ORDER — PROPOFOL 500 MG/50ML IV EMUL
INTRAVENOUS | Status: DC | PRN
  Administered 2021-02-26: 150 ug/kg/min via INTRAVENOUS

## 2021-02-26 MED ORDER — LACTATED RINGERS IV SOLN
INTRAVENOUS | Status: AC | PRN
  Administered 2021-02-26: 1000 mL via INTRAVENOUS

## 2021-02-26 MED ORDER — PANTOPRAZOLE SODIUM 40 MG PO TBEC: 40.0000 mg | DELAYED_RELEASE_TABLET | Freq: Every day | ORAL | Status: DC

## 2021-02-26 MED ORDER — PHENYLEPHRINE 40 MCG/ML (10ML) SYRINGE FOR IV PUSH (FOR BLOOD PRESSURE SUPPORT)
PREFILLED_SYRINGE | INTRAVENOUS | Status: DC | PRN
  Administered 2021-02-26: 80 ug via INTRAVENOUS

## 2021-02-26 SURGICAL SUPPLY — 15 items

## 2021-02-26 NOTE — Anesthesia Preprocedure Evaluation (Addendum)
Anesthesia Evaluation  Patient identified by MRN, date of birth, ID band Patient awake    Reviewed: Allergy & Precautions, NPO status , Patient's Chart, lab work & pertinent test results, reviewed documented beta blocker date and time   History of Anesthesia Complications Negative for: history of anesthetic complications  Airway Mallampati: II  TM Distance: >3 FB Neck ROM: Full    Dental  (+) Missing,    Pulmonary sleep apnea and Continuous Positive Airway Pressure Ventilation , former smoker,    Pulmonary exam normal        Cardiovascular hypertension, Pt. on medications and Pt. on home beta blockers + Peripheral Vascular Disease  Normal cardiovascular exam  TTE 2018: mild LVH, EF 60-65%, valves ok   Neuro/Psych Anxiety negative neurological ROS     GI/Hepatic Neg liver ROS, GERD  Controlled and Medicated,dysphagia, abnormal esophagram   Endo/Other  diabetes, Type 2, Insulin Dependent, Oral Hypoglycemic Agents  Renal/GU ARF and Renal InsufficiencyRenal disease  negative genitourinary   Musculoskeletal negative musculoskeletal ROS (+)   Abdominal   Peds  Hematology negative hematology ROS (+)   Anesthesia Other Findings Day of surgery medications reviewed with patient.  Reproductive/Obstetrics negative OB ROS                            Anesthesia Physical Anesthesia Plan  ASA: III  Anesthesia Plan: MAC   Post-op Pain Management:    Induction:   PONV Risk Score and Plan: 1 and Treatment may vary due to age or medical condition and Propofol infusion  Airway Management Planned: Natural Airway and Nasal Cannula  Additional Equipment: None  Intra-op Plan:   Post-operative Plan:   Informed Consent: I have reviewed the patients History and Physical, chart, labs and discussed the procedure including the risks, benefits and alternatives for the proposed anesthesia with the patient or  authorized representative who has indicated his/her understanding and acceptance.       Plan Discussed with: CRNA  Anesthesia Plan Comments:        Anesthesia Quick Evaluation

## 2021-02-26 NOTE — Anesthesia Procedure Notes (Signed)
Procedure Name: MAC Date/Time: 02/26/2021 11:14 AM Performed by: Imagene Riches, CRNA Pre-anesthesia Checklist: Patient identified, Emergency Drugs available, Suction available, Patient being monitored and Timeout performed Patient Re-evaluated:Patient Re-evaluated prior to induction Oxygen Delivery Method: Nasal cannula

## 2021-02-26 NOTE — Plan of Care (Signed)
  Problem: Clinical Measurements: Goal: Ability to maintain clinical measurements within normal limits will improve Outcome: Progressing Goal: Will remain free from infection Outcome: Progressing Goal: Diagnostic test results will improve Outcome: Progressing Goal: Respiratory complications will improve Outcome: Progressing Goal: Cardiovascular complication will be avoided Outcome: Progressing   Problem: Activity: Goal: Risk for activity intolerance will decrease Outcome: Progressing   Problem: Elimination: Goal: Will not experience complications related to bowel motility Outcome: Progressing Goal: Will not experience complications related to urinary retention Outcome: Progressing   

## 2021-02-26 NOTE — Anesthesia Postprocedure Evaluation (Signed)
Anesthesia Post Note  Patient: [name].  Procedure(s) Performed: ESOPHAGOGASTRODUODENOSCOPY (EGD) WITH PROPOFOL (N/A ) BIOPSY BALLOON DILATION (N/A )     Patient location during evaluation: PACU Anesthesia Type: MAC Level of consciousness: awake and alert Pain management: pain level controlled Vital Signs Assessment: post-procedure vital signs reviewed and stable Respiratory status: spontaneous breathing, nonlabored ventilation and respiratory function stable Cardiovascular status: blood pressure returned to baseline and stable Postop Assessment: no apparent nausea or vomiting Anesthetic complications: no   No complications documented.  Last Vitals:  Vitals:   02/26/21 1005 02/26/21 1127  BP: (!) 195/77 131/60  Pulse: 95 84  Resp: 19 20  Temp: 36.7 C (!) 36.4 C  SpO2: 100% 99%    Last Pain:  Vitals:   02/26/21 1127  TempSrc: Temporal  PainSc: 0-No pain                 Pervis Hocking

## 2021-02-26 NOTE — Progress Notes (Signed)
Patient showered with supervision. Loperamide 2mg  effective for diarrhea. Patient allowed RN to reconnect his IVF.

## 2021-02-26 NOTE — Interval H&P Note (Signed)
History and Physical Interval Note:  02/26/2021 10:49 AM  Rip Harbour.  has presented today for surgery, with the diagnosis of dysphagia, abnormal esophagram.  The various methods of treatment have been discussed with the patient and family. After consideration of risks, benefits and other options for treatment, the patient has consented to  Procedure(s): ESOPHAGOGASTRODUODENOSCOPY (EGD) WITH PROPOFOL (N/A) as a surgical intervention.  The patient's history has been reviewed, patient examined, no change in status, stable for surgery.  I have reviewed the patient's chart and labs.  Questions were answered to the patient's satisfaction.     [name]

## 2021-02-26 NOTE — Progress Notes (Signed)
[facility] PROGRESS NOTE    Progress Note  [name] Harbourlarence Pereida Jr.  RUE:[id number] DOB: [birth year] DOA: 02/22/2021 PCP: Melida QuitterWile, Laura H, MD     Brief Narrative:   [name] HarbourClarence Lansdowne Jr. is an 70 y.o. male past medical history of diabetes mellitus type 1, renal and pancreatic transplant 2019 on tacrolimus twice a day, PAD status post left BKA's with complains of nausea and vomiting as well as low back pain, also with a history of cannabis cyclic vomiting syndrome, was started on Ativan and Reglan with no improvements.  Barium swallow was performed that showed possible obstruction GI was consulted on 02/25/2021 Significant studies: 02/26/2021 EGD  Assessment/Plan:   Dysphagia with nausea and vomiting last year: Barium swallow also possible obstruction. GI was consulted recommended an EGD to be done on 02/26/2021. Continue Zofran Reglan and Ativan at this time.  Hypertensive urgency: Currently on amlodipine, labetalol and Cozaar.  Using hydralazine IV as needed for breakthrough.  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  Blood pressure is well-controlled.  Acute kidney injury Likely prerenal azotemia in the setting of nausea vomiting and diuretic use. Discontinue IV Lasix start IV fluids.  Gastroparesis: At home he is on Reglan we will resume after EGD.  Chronic thrombocytopenia: Likely due to Prograf.  Ranging around 120.  DVT prophylaxis: lovenox Family Communication:none Status is: Inpatient  Remains inpatient appropriate because:Hemodynamically unstable   Dispo: The patient is from: Home              Anticipated d/c is to: Home              Patient currently is not medically stable to d/c.   Difficult to place patient No        Code Status:     Code Status Orders  (From admission, onward)         Start     Ordered   02/23/21 0126  Full code  Continuous        02/23/21 0125         Code Status History    Date Active Date Inactive Code Status Order ID Comments User Context   01/25/2020 0504 01/27/2020 1623 Full Code [id number]  Eduard ClosKakrakandy, Arshad N, MD ED   09/12/2018 2324 09/14/2018 1935 Full Code [id number]  Charlsie QuestPatel, Vishal R, MD ED   07/28/2018 0149 07/29/2018 1540 Full Code [id number]  Eduard ClosKakrakandy, Arshad N, MD ED   01/05/2018 0344 01/07/2018 1834 Full Code [id number]  Clydie BraunSmith, Rondell A, MD ED   10/18/2017 2351 10/21/2017 1543 Full Code [id number]  Eduard ClosKakrakandy, Arshad N, MD ED   07/24/2017 0818 07/28/2017 1934 Full Code [id number]  Eduard ClosKakrakandy, Arshad N, MD ED   02/22/2015 1716 03/05/2015 2043 Full Code [id number]  Jacquelynn CreeLove, Pamela S, PA-C Inpatient   02/20/2015 1140 02/22/2015 1716 Full Code [id number]  Nadara Mustarduda, Marcus V, MD Inpatient   01/23/2015 1427 01/25/2015 1900 Full Code [id number]  Nadara Mustarduda, Marcus V, MD Inpatient   01/21/2015 1214 01/23/2015 1427 Full Code [id number]  Chuck Hintickson, Christopher S, MD Inpatient   01/13/2015 1812 01/21/2015 1214 Full Code [id number]  Kathryne HitchBlackman, Christopher Y, MD Inpatient   01/13/2015 0215 01/13/2015 1812 Full Code [id number]  Ron ParkerJenkins, Harvette C, MD Inpatient   Advance Care Planning Activity        IV Access:    Peripheral IV   Procedures and diagnostic studies:   DG ESOPHAGUS W SINGLE CM (SOL OR THIN BA)  Result Date: 02/25/2021 CLINICAL DATA:  Dysphagia.  Additional history provided: Patient reports regurgitation of solid foods, discomfort in the region of the neck/throat during and after eating, ACDF 01/08/2020. EXAM: ESOPHOGRAM / BARIUM SWALLOW / BARIUM TABLET STUDY TECHNIQUE: Combined double contrast and single contrast examination performed using effervescent crystals, thick barium liquid, and thin barium liquid. The patient was observed with fluoroscopy swallowing a 13 mm barium sulphate tablet. FLUOROSCOPY TIME:  Fluoroscopy Time:  2 minutes, 54 seconds Radiation Exposure Index (if provided by the fluoroscopic device): 35.10 mGy. Number of Acquired Spot Images: None  COMPARISON:  Chest radiograph 02/22/2021. FINDINGS: Circumferentially narrowed appearance of the distal esophagus, just above the level of a small to moderate-sized hiatal hernia. A swallowed 13 mm barium tablet did not pass beyond this level, despite a prolonged period of observation. The esophagus is otherwise normal in caliber and smooth in contour. Mild intermittent esophageal dysmotility. No gastroesophageal reflux was observed. IMPRESSION: Circumferentially narrowed appearance of the distal esophagus, just above the level of a small to moderate-sized hiatal hernia. A swallowed 13 mm barium tablet did not pass beyond this level, despite a prolonged period of observation. Findings are suspicious for an underlying distal esophageal stricture, and endoscopy should be considered for further evaluation. Mild intermittent esophageal dysmotility. Otherwise unremarkable esophagram, as described. Electronically Signed   By: Jackey Loge DO   On: 02/25/2021 15:22     Medical Consultants:    None.   Subjective:    [name]. relates he is hungry no new complaints.  Objective:    Vitals:   02/25/21 1623 02/25/21 1939 02/26/21 0519 02/26/21 0846  BP: (!) 148/71 126/60 (!) 154/69 (!) 153/67  Pulse: 87 94 89 90  Resp: 18 18 18 18   Temp: 97.9 F (36.6 C) 99.5 F (37.5 C) 99.2 F (37.3 C) 98.5 F (36.9 C)  TempSrc: Oral Oral Oral Oral  SpO2: 100% 98% 99% 100%  Weight:   70.4 kg   Height:       SpO2: 100 %   Intake/Output Summary (Last 24 hours) at 02/26/2021 0942 Last data filed at 02/26/2021 04/28/2021 Gross per 24 hour  Intake 1020.28 ml  Output 400 ml  Net 620.28 ml   Filed Weights   02/24/21 0505 02/25/21 0400 02/26/21 0519  Weight: 70.9 kg 71.3 kg 70.4 kg    Exam: General exam: In no acute distress. Respiratory system: Good air movement and clear to auscultation. Cardiovascular system: S1 & S2 heard, RRR. No JVD. Gastrointestinal system: Abdomen is nondistended, soft and  nontender.  Extremities: Left BKA Skin: No rashes, lesions or ulcers Psychiatry: Judgement and insight appear normal. Mood & affect appropriate.    Data Reviewed:    Labs: Basic Metabolic Panel: Recent Labs  Lab 02/22/21 1742 02/22/21 1811 02/23/21 0553 02/24/21 0203 02/25/21 0345 02/26/21 0355  NA 139 140  140 139 134* 135 134*  K 4.2 4.3  4.1 4.1 3.9 4.1 3.7  CL 106 108 108 103 101 101  CO2 19*  --  19* 19* 18* 20*  GLUCOSE 264* 256* 224* 143* 150* 92  BUN 17 20 15 19  32* 37*  CREATININE 1.12 0.90 0.95 1.22 1.31* 1.38*  CALCIUM 9.8  --  9.6 9.7 9.7 9.5   GFR Estimated Creatinine Clearance: 44.1 mL/min (A) (by C-G formula based on SCr of 1.38 mg/dL (H)). Liver Function Tests: Recent Labs  Lab 02/22/21 1742 02/24/21 0203 02/25/21 0345 02/26/21 0355  AST 19 17 13* 11*  ALT 12 11 13 10   ALKPHOS 100 83  82 70  BILITOT 0.7 0.8 0.8 0.6  PROT 8.1 7.3 7.2 6.5  ALBUMIN 4.9 4.4 4.4 4.0   Recent Labs  Lab 02/22/21 1742  LIPASE 19   No results for input(s): AMMONIA in the last 168 hours. Coagulation profile No results for input(s): INR, PROTIME in the last 168 hours. COVID-19 Labs  No results for input(s): DDIMER, FERRITIN, LDH, CRP in the last 72 hours.  Lab Results  Component Value Date   SARSCOV2NAA NEGATIVE 02/22/2021   SARSCOV2NAA POSITIVE (A) 09/28/2020   SARSCOV2NAA NEGATIVE 06/26/2020   SARSCOV2NAA NEGATIVE 01/25/2020    CBC: Recent Labs  Lab 02/22/21 1742 02/22/21 1811 02/23/21 0553 02/24/21 0203 02/25/21 0345 02/26/21 0355  WBC 9.7  --  8.7 10.3 7.1 5.0  NEUTROABS 8.7*  --   --  7.9* 5.2 3.3  HGB 15.5 17.0  17.0 14.8 15.0 15.7 15.0  HCT 49.1 50.0  50.0 46.1 47.2 47.5 45.4  MCV 83.9  --  81.7 82.4 80.4 79.6*  PLT 104*  --  97* 107* 121* 113*   Cardiac Enzymes: No results for input(s): CKTOTAL, CKMB, CKMBINDEX, TROPONINI in the last 168 hours. BNP (last 3 results) No results for input(s): PROBNP in the last 8760 hours. CBG: Recent  Labs  Lab 02/25/21 1145 02/25/21 1736 02/25/21 2346 02/26/21 0552 02/26/21 0914  GLUCAP 133* 204* 140* 93 81   D-Dimer: No results for input(s): DDIMER in the last 72 hours. Hgb A1c: No results for input(s): HGBA1C in the last 72 hours. Lipid Profile: No results for input(s): CHOL, HDL, LDLCALC, TRIG, CHOLHDL, LDLDIRECT in the last 72 hours. Thyroid function studies: No results for input(s): TSH, T4TOTAL, T3FREE, THYROIDAB in the last 72 hours.  Invalid input(s): FREET3 Anemia work up: No results for input(s): VITAMINB12, FOLATE, FERRITIN, TIBC, IRON, RETICCTPCT in the last 72 hours. Sepsis Labs: Recent Labs  Lab 02/22/21 1740 02/22/21 1742 02/22/21 1852 02/23/21 0553 02/24/21 0203 02/25/21 0345 02/26/21 0355  WBC  --    < >  --  8.7 10.3 7.1 5.0  LATICACIDVEN 2.8*  --  2.1* 2.1*  --   --   --    < > = values in this interval not displayed.   Microbiology Recent Results (from the past 240 hour(s))  Resp Panel by RT-PCR (Flu A&B, Covid) Nasopharyngeal Swab     Status: None   Collection Time: 02/22/21  7:00 PM   Specimen: Nasopharyngeal Swab; Nasopharyngeal(NP) swabs in vial transport medium  Result Value Ref Range Status   SARS Coronavirus 2 by RT PCR NEGATIVE NEGATIVE Final    Comment: (NOTE) SARS-CoV-2 target nucleic acids are NOT DETECTED.  The SARS-CoV-2 RNA is generally detectable in upper respiratory specimens during the acute phase of infection. The lowest concentration of SARS-CoV-2 viral copies this assay can detect is 138 copies/mL. A negative result does not preclude SARS-Cov-2 infection and should not be used as the sole basis for treatment or other patient management decisions. A negative result may occur with  improper specimen collection/handling, submission of specimen other than nasopharyngeal swab, presence of viral mutation(s) within the areas targeted by this assay, and inadequate number of viral copies(<138 copies/mL). A negative result must  be combined with clinical observations, patient history, and epidemiological information. The expected result is Negative.  Fact Sheet for Patients:  BloggerCourse.com  Fact Sheet for Healthcare Providers:  SeriousBroker.it  This test is no t yet approved or cleared by the [place] and  has been authorized for  detection and/or diagnosis of SARS-CoV-2 by FDA under an Emergency Use Authorization (EUA). This EUA will remain  in effect (meaning this test can be used) for the duration of the COVID-19 declaration under Section 564(b)(1) of the Act, 21 U.S.C.section 360bbb-3(b)(1), unless the authorization is terminated  or revoked sooner.       Influenza A by PCR NEGATIVE NEGATIVE Final   Influenza B by PCR NEGATIVE NEGATIVE Final    Comment: (NOTE) The Xpert Xpress SARS-CoV-2/FLU/RSV plus assay is intended as an aid in the diagnosis of influenza from Nasopharyngeal swab specimens and should not be used as a sole basis for treatment. Nasal washings and aspirates are unacceptable for Xpert Xpress SARS-CoV-2/FLU/RSV testing.  Fact Sheet for Patients: BloggerCourse.com  Fact Sheet for Healthcare Providers: SeriousBroker.it  This test is not yet approved or cleared by the [place] FDA and has been authorized for detection and/or diagnosis of SARS-CoV-2 by FDA under an Emergency Use Authorization (EUA). This EUA will remain in effect (meaning this test can be used) for the duration of the COVID-19 declaration under Section 564(b)(1) of the Act, 21 U.S.C. section 360bbb-3(b)(1), unless the authorization is terminated or revoked.  Performed at [facility], [address] [place], [place] [address]   Culture, blood (routine x 2)     Status: None (Preliminary result)   Collection Time: 02/22/21  8:10 PM   Specimen: BLOOD LEFT HAND  Result Value Ref Range Status    Specimen Description BLOOD LEFT HAND  Final   Special Requests   Final    BOTTLES DRAWN AEROBIC AND ANAEROBIC Blood Culture adequate volume   Culture   Final    NO GROWTH 4 DAYS Performed at [facility], [address] [place], [place] [address]    Report Status PENDING  Incomplete  Culture, blood (routine x 2)     Status: None (Preliminary result)   Collection Time: 02/22/21  8:20 PM   Specimen: BLOOD LEFT FOREARM  Result Value Ref Range Status   Specimen Description BLOOD LEFT FOREARM  Final   Special Requests   Final    BOTTLES DRAWN AEROBIC ONLY Blood Culture results may not be optimal due to an inadequate volume of blood received in culture bottles   Culture   Final    NO GROWTH 4 DAYS Performed at [facility], [address] [place], [place] [address]    Report Status PENDING  Incomplete     Medications:   . amLODipine  10 mg Oral Daily  . aspirin EC  81 mg Oral Daily  . atorvastatin  40 mg Oral QHS  . enoxaparin (LOVENOX) injection  40 mg Subcutaneous Q24H  . furosemide  20 mg Oral Daily  . insulin glargine  14 Units Subcutaneous QHS  . labetalol  200 mg Oral BID  . losartan  50 mg Oral Daily  . metoCLOPramide (REGLAN) injection  10 mg Intravenous Q12H  . mycophenolate  1,000 mg Oral BID  . pantoprazole  40 mg Oral Daily  . potassium chloride  20 mEq Oral Daily  . tacrolimus  5 mg Oral BID   Continuous Infusions: . sodium chloride 50 mL/hr at 02/26/21 0812      LOS: 3 days   Marinda Elk  [facility]  02/26/2021, 9:42 AM

## 2021-02-26 NOTE — Progress Notes (Signed)
  Speech Language Pathology Treatment: Dysphagia  Patient Details Name: [name]. MRN: [id number] DOB: [birth year] Today's Date: 02/26/2021 Time: 9983-3825 SLP Time Calculation (min) (ACUTE ONLY): 11 min  Assessment / Plan / Recommendation Clinical Impression  Pt was seen after EGD with dilation earlier in the day, since advanced to a diet of regular solids and thin liquids. Pt's subjective complaints have improved. He denies any feeling of food or liquid getting stuck while consuming regular solids with liquid wash. He also reports that his pills earlier went "right down" which is different than during his esophagram when he could sense when that tablet got stuck. Although this diet is more advanced than what had been recommended at his recent MBS, he shows no overt signs of aspiration with it. He takes his time to ensure adequate mastication and benefited from a single cue to use a liquid wash. Will continue current diet, encouraging pt to try to eat while still in the hospital to try to make sure he can tolerate it.    HPI HPI: Pt is a 70 yo male presenting with back pain, chills, and N/V. Pt found to have likely cyclical vomiting syndrome with cannabis use, DM with probable gastroparesis, hypertensive urgency. Pt had recent MBS 02/13/21 at the [facility] revealing inconsistent epiglottic inversion and moderate pharyngeal residue primarily in the vallculae. Pt was recommended to have a soft diet/thin liquids and a trial of pharyngeal exercises. Pt was also noted to have residue in his distal esophagus with retrograde flow. PMH also includes: ACDF August 2020, kidney/pancreatic transplant, PAD s/p L BKA, thyroid disease, OSA, GERD, legally blind L eye      SLP Plan  Continue with current plan of care       Recommendations  Diet recommendations: Regular;Thin liquid Liquids provided via: Cup;Straw Medication Administration: Whole meds with liquid Supervision: Patient able to self  feed;Intermittent supervision to cue for compensatory strategies Compensations: Slow rate;Small sips/bites;Follow solids with liquid Postural Changes and/or Swallow Maneuvers: Seated upright 90 degrees;Upright 30-60 min after meal                Oral Care Recommendations: Oral care BID Follow up Recommendations: Outpatient SLP SLP Visit Diagnosis: Dysphagia, pharyngeal phase (R13.13) Plan: Continue with current plan of care       GO                 Mahala Menghini., M.A. CCC-SLP Acute Rehabilitation Services Pager [phone] Office [phone]  02/26/2021, 4:53 PM

## 2021-02-26 NOTE — Progress Notes (Signed)
Spoke with Dr. Arville Care. New order received for NPO except ice chips.

## 2021-02-26 NOTE — Transfer of Care (Signed)
Immediate Anesthesia Transfer of Care Note  Patient: [name].  Procedure(s) Performed: ESOPHAGOGASTRODUODENOSCOPY (EGD) WITH PROPOFOL (N/A ) BIOPSY BALLOON DILATION (N/A )  Patient Location: Endoscopy Unit  Anesthesia Type:MAC  Level of Consciousness: drowsy  Airway & Oxygen Therapy: Patient Spontanous Breathing  Post-op Assessment: Report given to RN and Post -op Vital signs reviewed and stable  Post vital signs: Reviewed and stable  Last Vitals:  Vitals Value Taken Time  BP 131/60 02/26/21 1126  Temp    Pulse 85 02/26/21 1127  Resp 20 02/26/21 1127  SpO2 100 % 02/26/21 1127  Vitals shown include unvalidated device data.  Last Pain:  Vitals:   02/26/21 1005  TempSrc: Temporal  PainSc: 0-No pain         Complications: No complications documented.

## 2021-02-26 NOTE — Progress Notes (Signed)
Patient requesting ice chips. Explained NPO status pending EGD to patient again. Patient states "ask the doctor if I can have ice chips". Dr. Arville Care paged per patient request.

## 2021-02-26 NOTE — Op Note (Signed)
[facility] Patient Name: [name] DikeClarence [name] Procedure Date : 02/26/2021 MRN: [id number] Attending MD: Wilhemina BonitoJohn N. Marina GoodellPerry , MD Date of Birth: [birth year] CSN: [id number] Age: [age] Admit Type: Inpatient Procedure:                Upper GI endoscopy with balloon dilation of the                            esophagus, 20 mm; with biopsies Indications:              Dysphagia, Esophageal reflux, Abnormal                            cine-esophagram Providers:                Wilhemina BonitoJohn N. Marina GoodellPerry, MD, Rogue JurySandy Andrews, RN, Rosilyn MingsNichole                            Montalvo, Technician Referring MD:             [facility] Medicines:                Monitored Anesthesia Care Complications:            No immediate complications. Estimated Blood Loss:     Estimated blood loss: none. Procedure:                Pre-Anesthesia Assessment:                           - Prior to the procedure, a History and Physical                            was performed, and patient medications and                            allergies were reviewed. The patient's tolerance of                            previous anesthesia was also reviewed. The risks                            and benefits of the procedure and the sedation                            options and risks were discussed with the patient.                            All questions were answered, and informed consent                            was obtained. Prior Anticoagulants: The patient has                            taken Lovenox (enoxaparin), last dose was day of  procedure (6 hours preprocedure). ASA Grade                            Assessment: III - A patient with severe systemic                            disease. After reviewing the risks and benefits,                            the patient was deemed in satisfactory condition to                            undergo the procedure.                           After obtaining informed consent,  the endoscope was                            passed under direct vision. Throughout the                            procedure, the patient's blood pressure, pulse, and                            oxygen saturations were monitored continuously. The                            GIF-H190 ([id number]) Olympus gastroscope was                            introduced through the mouth, and advanced to the                            second part of duodenum. The upper GI endoscopy was                            accomplished without difficulty. The patient                            tolerated the procedure well. Scope In: Scope Out: Findings:      The esophagus revealed mild distal esophagitis as manifested by mucosal       edema.      One benign-appearing, intrinsic moderate stenosis was found 41 cm from       the incisors. This stenosis measured 1.5 cm (inner diameter). After       completing the endoscopic survey, A TTS dilator was passed through the       scope. Dilation with an 18-19-20 mm balloon dilator was performed to 20       mm.      The stomach revealed diffusely atrophic mucosa. In addition there were       several superficial erosions in the proximal antrum. Biopsies were taken       with a cold forceps for histology.      The examined duodenum was normal.      The cardia  and gastric fundus were normal on retroflexion. Impression:               1. GERD complicated by peptic stricture status post                            dilation                           2. Reflux esophagitis                           3. Atrophic gastric mucosa with several superficial                            erosions. Status post biopsies. Recommendation:           1. Resume previous diet                           2. Resume previous medications. Okay to resume                            Lovenox if needed                           3. Pantoprazole 40 mg daily. This patient needs to                            be on a  daily PPI. I believe he gets his                            medications from the [facility] hospital.                           4. Follow-up biopsies (we will follow-up biopsies).                           5. Outpatient GI follow-up appointment NOT required                            (GI follow-up as needed).                           - I have discussed the endoscopic findings and                            clinical recommendations with the patient. He was                            provided a copy of this report for his personal                            records.                           - We will sign off. Procedure Code(s):        ---  Professional ---                           [phone], Esophagogastroduodenoscopy, flexible,                            transoral; with transendoscopic balloon dilation of                            esophagus (less than 30 mm diameter) Diagnosis Code(s):        --- Professional ---                           K22.2, Esophageal obstruction                           R13.10, Dysphagia, unspecified                           K21.9, Gastro-esophageal reflux disease without                            esophagitis                           R93.3, Abnormal findings on diagnostic imaging of                            other parts of digestive tract CPT copyright 2019 American Medical Association. All rights reserved. The codes documented in this report are preliminary and upon coder review may  be revised to meet current compliance requirements. Wilhemina Bonito. Marina Goodell, MD 02/26/2021 11:26:31 AM This report has been signed electronically. Number of Addenda: 0

## 2021-02-27 ENCOUNTER — Encounter (HOSPITAL_COMMUNITY): Payer: Self-pay | Admitting: Internal Medicine

## 2021-02-27 DIAGNOSIS — I160 Hypertensive urgency: Secondary | ICD-10-CM | POA: Diagnosis not present

## 2021-02-27 LAB — CULTURE, BLOOD (ROUTINE X 2)
Culture: NO GROWTH
Culture: NO GROWTH
Special Requests: ADEQUATE

## 2021-02-27 LAB — GLUCOSE, CAPILLARY
Glucose-Capillary: 100 mg/dL — ABNORMAL HIGH (ref 70–99)
Glucose-Capillary: 122 mg/dL — ABNORMAL HIGH (ref 70–99)

## 2021-02-27 LAB — COMPREHENSIVE METABOLIC PANEL
ALT: 11 U/L (ref 0–44)
AST: 10 U/L — ABNORMAL LOW (ref 15–41)
Albumin: 3.5 g/dL (ref 3.5–5.0)
Alkaline Phosphatase: 61 U/L (ref 38–126)
Anion gap: 10 (ref 5–15)
BUN: 27 mg/dL — ABNORMAL HIGH (ref 8–23)
CO2: 21 mmol/L — ABNORMAL LOW (ref 22–32)
Calcium: 8.9 mg/dL (ref 8.9–10.3)
Chloride: 102 mmol/L (ref 98–111)
Creatinine, Ser: 1.04 mg/dL (ref 0.61–1.24)
GFR, Estimated: >60 mL/min (ref >60)
Glucose, Bld: 97 mg/dL (ref 70–99)
Potassium: 3.5 mmol/L (ref 3.5–5.1)
Sodium: 133 mmol/L — ABNORMAL LOW (ref 135–145)
Total Bilirubin: 0.7 mg/dL (ref 0.3–1.2)
Total Protein: 6.1 g/dL — ABNORMAL LOW (ref 6.5–8.1)

## 2021-02-27 LAB — SURGICAL PATHOLOGY

## 2021-02-27 MED ORDER — POTASSIUM CHLORIDE CRYS ER 20 MEQ PO TBCR
40.0000 meq | EXTENDED_RELEASE_TABLET | Freq: Two times a day (BID) | ORAL | Status: DC
  Administered 2021-02-27: 40 meq via ORAL
  Filled 2021-02-27: qty 2

## 2021-02-27 MED ORDER — PANTOPRAZOLE SODIUM 40 MG PO TBEC: 40.0000 mg | DELAYED_RELEASE_TABLET | Freq: Every day | ORAL | 3 refills | Status: DC

## 2021-02-27 NOTE — Discharge Summary (Signed)
Physician Discharge Summary  [name] DOB: 23-Oct-1950 DOA: 02/22/2021  PCP: Melida QuitterWile, Laura H, MD  Admit date: 02/22/2021 Discharge date: 02/27/2021  Admitted From: Home Disposition:  Home  Recommendations for Outpatient Follow-up:  1. Follow up withGI in 1-2 weeks, follow-up on biopsy results. 2. Please obtain BMP/CBC in one week 3.   Home Health:no Equipment/Devices:None  Discharge Condition:Stable CODE STATUS:Full Diet recommendation: Heart Healthy   Brief/Interim Summary:  70 y.o. male past medical history of diabetes mellitus type 1, renal and pancreatic transplant 2019 on tacrolimus twice a day, PAD status post left BKA's with complains of nausea and vomiting as well as low back pain, also with a history of cannabis cyclic vomiting syndrome, was started on Ativan and Reglan with no improvements.  Barium swallow was performed that showed possible obstruction GI was consulted on 02/25/2021  Discharge Diagnoses:  Principal Problem:   Hypertensive urgency Active Problems:   Peripheral vascular disease (HCC)   Intractable nausea and vomiting   Kidney transplant recipient   Type 2 diabetes mellitus with diabetic polyneuropathy, with long-term current use of insulin (HCC)   Pancreas transplant status (HCC)   Gastroparesis  Nausea and vomiting due to esophageal stricture: Barium swallow done that showed obstruction GI was consulted recommended an EGD done on 02/26/2021 that showed an esophageal peptic stricture status post dilation with esophageal reflux esophagitis and gastric mucosa atrophic with several superficial erosions. He was started on Protonix and will continue this at home. He will follow-up with results GI as an outpatient.  Hypertensive urgency: No changes made to his medication there is likely due to pain.  Insulin-dependent diabetes mellitus type 2 with peripheral neuropathy: No change made to his medication continue current regimen.  Acute  kidney injury: Likely prerenal azotemia in the setting of nausea vomiting and diuretic use does resolve with holding medication IV fluids creatinine turn to baseline.  Gastroparesis: Continue home dose of Reglan.  Chronic thrombocytopenia: Likely due to poor breath.  Discharge Instructions  Discharge Instructions    Diet - low sodium heart healthy   Complete by: As directed    Increase activity slowly   Complete by: As directed      Allergies as of 02/27/2021   No Known Allergies     Medication List    TAKE these medications   amLODipine 10 MG tablet Commonly known as: NORVASC Take 10 mg by mouth daily.   aspirin EC 81 MG tablet Take 81 mg by mouth daily.   atorvastatin 80 MG tablet Commonly known as: LIPITOR Take 40 mg by mouth at bedtime.   cyclobenzaprine 5 MG tablet Commonly known as: FLEXERIL Take 1 tablet (5 mg total) by mouth 2 (two) times daily as needed for muscle spasms.   ergocalciferol 1.25 MG (50000 UT) capsule Commonly known as: VITAMIN D2 Take 50,000 Units by mouth every Wednesday.   furosemide 20 MG tablet Commonly known as: LASIX Take 1 tablet (20 mg total) by mouth daily.   Insulin Pen Needle 32G X 4 MM Misc 1 Device by Does not apply route as directed.   labetalol 100 MG tablet Commonly known as: NORMODYNE Take 2 tablets (200 mg total) by mouth 2 (two) times daily.   Lantus SoloStar 100 UNIT/ML Solostar Pen Generic drug: insulin glargine Inject 28 Units into the skin daily.   lisinopril 10 MG tablet Commonly known as: ZESTRIL Take 1 tablet (10 mg total) by mouth daily.   losartan 50 MG tablet Commonly known as: COZAAR  Take 50 mg by mouth daily.   metFORMIN 500 MG 24 hr tablet Commonly known as: GLUCOPHAGE-XR Take 1 tablet (500 mg total) by mouth 2 (two) times daily.   metoCLOPramide 5 MG tablet Commonly known as: Reglan Take 1 tablet (5 mg total) by mouth 3 (three) times daily for 3 days.   mycophenolate 250 MG  capsule Commonly known as: CELLCEPT Take 1,000 mg by mouth 2 (two) times daily.   ondansetron 4 MG tablet Commonly known as: ZOFRAN Take 1 tablet (4 mg total) by mouth every 8 (eight) hours as needed for nausea or vomiting.   ondansetron 8 MG disintegrating tablet Commonly known as: Zofran ODT Take 1 tablet (8 mg total) by mouth every 8 (eight) hours as needed for nausea.   pantoprazole 40 MG tablet Commonly known as: PROTONIX Take 1 tablet (40 mg total) by mouth daily.   Potassium Chloride ER 20 MEQ Tbcr Take 20 mEq by mouth daily.   sildenafil 50 MG tablet Commonly known as: VIAGRA Take 50 mg by mouth as needed.   silver sulfADIAZINE 1 % cream Commonly known as: SILVADENE Apply 1 application topically daily.   tacrolimus 5 MG capsule Commonly known as: PROGRAF Take 5 mg by mouth 2 (two) times daily.   traZODone 50 MG tablet Commonly known as: DESYREL Take 50 mg by mouth at bedtime as needed for sleep.       No Known Allergies  Consultations:  [facility]   Procedures/Studies: CT Abdomen Pelvis Wo Contrast  Result Date: 02/22/2021 CLINICAL DATA:  Acute abdominal pain EXAM: CT ABDOMEN AND PELVIS WITHOUT CONTRAST TECHNIQUE: Multidetector CT imaging of the abdomen and pelvis was performed following the standard protocol without IV contrast. COMPARISON:  01/30/2020 FINDINGS: Lower chest: No acute abnormality. Hepatobiliary: No focal liver abnormality is seen. No gallstones, gallbladder wall thickening, or biliary dilatation. Pancreas: Unremarkable. No pancreatic ductal dilatation or surrounding inflammatory changes. Known pancreatic transplant is not well appreciated on this noncontrast study. Spleen: Normal in size without focal abnormality. Adrenals/Urinary Tract: Adrenal glands are within normal limits. Kidneys are atrophic bilaterally but stable. No obstructive changes are seen. Transplant kidney is noted in the left hemipelvis without obstructive change.  No renal calculi are noted. Bladder is partially distended. Stomach/Bowel: Colon shows no obstructive or inflammatory changes. The appendix is within normal limits. No small bowel or gastric abnormality is seen. Vascular/Lymphatic: Aortic atherosclerosis. No enlarged abdominal or pelvic lymph nodes. Reproductive: Prostate is unremarkable. Other: No abdominal wall hernia or abnormality. No abdominopelvic ascites. Musculoskeletal: No acute or significant osseous findings. IMPRESSION: Status post kidney transplant without obstructive change. The native kidneys are atrophic. Known pancreas transplant is not well appreciated on this noncontrast study. No other focal abnormality is noted. Electronically Signed   By: Alcide Clever M.D.   On: 02/22/2021 22:49   CT Head Wo Contrast  Result Date: 02/22/2021 CLINICAL DATA:  70 year old male with headache. EXAM: CT HEAD WITHOUT CONTRAST TECHNIQUE: Contiguous axial images were obtained from the base of the skull through the vertex without intravenous contrast. COMPARISON:  Head CT dated 06/26/2020. FINDINGS: Brain: Mild age-related atrophy and chronic microvascular ischemic changes. There is no acute intracranial hemorrhage. No mass effect midline shift. No extra-axial fluid collection. Vascular: Similar appearance of vascular calcifications. Skull: Normal. Negative for fracture or focal lesion. Sinuses/Orbits: No acute finding. Other: None IMPRESSION: 1. No acute intracranial pathology. 2. Mild age-related atrophy and chronic microvascular ischemic changes. Electronically Signed   By: Ceasar Mons.D.  On: 02/22/2021 22:43   DG Chest Port 1 View  Result Date: 02/22/2021 CLINICAL DATA:  Cough. EXAM: PORTABLE CHEST 1 VIEW COMPARISON:  June 26, 2020 FINDINGS: The heart size and mediastinal contours are within normal limits. Both lungs are clear. The visualized skeletal structures are unremarkable. IMPRESSION: No active disease. Electronically Signed   By:  Ted Mcalpine M.D.   On: 02/22/2021 17:56   DG ESOPHAGUS W SINGLE CM (SOL OR THIN BA)  Result Date: 02/25/2021 CLINICAL DATA:  Dysphagia. Additional history provided: Patient reports regurgitation of solid foods, discomfort in the region of the neck/throat during and after eating, ACDF 01/08/2020. EXAM: ESOPHOGRAM / BARIUM SWALLOW / BARIUM TABLET STUDY TECHNIQUE: Combined double contrast and single contrast examination performed using effervescent crystals, thick barium liquid, and thin barium liquid. The patient was observed with fluoroscopy swallowing a 13 mm barium sulphate tablet. FLUOROSCOPY TIME:  Fluoroscopy Time:  2 minutes, 54 seconds Radiation Exposure Index (if provided by the fluoroscopic device): 35.10 mGy. Number of Acquired Spot Images: None COMPARISON:  Chest radiograph 02/22/2021. FINDINGS: Circumferentially narrowed appearance of the distal esophagus, just above the level of a small to moderate-sized hiatal hernia. A swallowed 13 mm barium tablet did not pass beyond this level, despite a prolonged period of observation. The esophagus is otherwise normal in caliber and smooth in contour. Mild intermittent esophageal dysmotility. No gastroesophageal reflux was observed. IMPRESSION: Circumferentially narrowed appearance of the distal esophagus, just above the level of a small to moderate-sized hiatal hernia. A swallowed 13 mm barium tablet did not pass beyond this level, despite a prolonged period of observation. Findings are suspicious for an underlying distal esophageal stricture, and endoscopy should be considered for further evaluation. Mild intermittent esophageal dysmotility. Otherwise unremarkable esophagram, as described. Electronically Signed   By: Jackey Loge DO   On: 02/25/2021 15:22     Subjective: Tolerating his diet feels great.  Discharge Exam: Vitals:   02/26/21 2002 02/27/21 0401  BP: (!) 163/78 (!) 156/59  Pulse: 81 82  Resp: 18 18  Temp: 99 F (37.2 C) 99.4  F (37.4 C)  SpO2: 100% 98%   Vitals:   02/26/21 1217 02/26/21 1639 02/26/21 2002 02/27/21 0401  BP: (!) 177/81 (!) 141/53 (!) 163/78 (!) 156/59  Pulse: 94 81 81 82  Resp: Temp: 97.8 F (36.6 C) 98.3 F (36.8 C) 99 F (37.2 C) 99.4 F (37.4 C)  TempSrc: Oral Oral Oral Oral  SpO2: 99% 100% 100% 98%  Weight:    71.9 kg  Height:        General: Pt is alert, awake, not in acute distress Cardiovascular: RRR, S1/S2 +, no rubs, no gallops Respiratory: CTA bilaterally, no wheezing, no rhonchi Abdominal: Soft, NT, ND, bowel sounds + Extremities: no edema, no cyanosis    The results of significant diagnostics from this hospitalization (including imaging, microbiology, ancillary and laboratory) are listed below for reference.     Microbiology: Recent Results (from the past 240 hour(s))  Resp Panel by RT-PCR (Flu A&B, Covid) Nasopharyngeal Swab     Status: None   Collection Time: 02/22/21  7:00 PM   Specimen: Nasopharyngeal Swab; Nasopharyngeal(NP) swabs in vial transport medium  Result Value Ref Range Status   SARS Coronavirus 2 by RT PCR NEGATIVE NEGATIVE Final    Comment: (NOTE) SARS-CoV-2 target nucleic acids are NOT DETECTED.  The SARS-CoV-2 RNA is generally detectable in upper respiratory specimens during the acute phase of infection. The lowest concentration  of SARS-CoV-2 viral copies this assay can detect is 138 copies/mL. A negative result does not preclude SARS-Cov-2 infection and should not be used as the sole basis for treatment or other patient management decisions. A negative result may occur with  improper specimen collection/handling, submission of specimen other than nasopharyngeal swab, presence of viral mutation(s) within the areas targeted by this assay, and inadequate number of viral copies(<138 copies/mL). A negative result must be combined with clinical observations, patient history, and epidemiological information. The expected result is  Negative.  Fact Sheet for Patients:  BloggerCourse.com  Fact Sheet for Healthcare Providers:  SeriousBroker.it  This test is no t yet approved or cleared by the [place] FDA and  has been authorized for detection and/or diagnosis of SARS-CoV-2 by FDA under an Emergency Use Authorization (EUA). This EUA will remain  in effect (meaning this test can be used) for the duration of the COVID-19 declaration under Section 564(b)(1) of the Act, 21 U.S.C.section 360bbb-3(b)(1), unless the authorization is terminated  or revoked sooner.       Influenza A by PCR NEGATIVE NEGATIVE Final   Influenza B by PCR NEGATIVE NEGATIVE Final    Comment: (NOTE) The Xpert Xpress SARS-CoV-2/FLU/RSV plus assay is intended as an aid in the diagnosis of influenza from Nasopharyngeal swab specimens and should not be used as a sole basis for treatment. Nasal washings and aspirates are unacceptable for Xpert Xpress SARS-CoV-2/FLU/RSV testing.  Fact Sheet for Patients: BloggerCourse.com  Fact Sheet for Healthcare Providers: SeriousBroker.it  This test is not yet approved or cleared by the [place] FDA and has been authorized for detection and/or diagnosis of SARS-CoV-2 by FDA under an Emergency Use Authorization (EUA). This EUA will remain in effect (meaning this test can be used) for the duration of the COVID-19 declaration under Section 564(b)(1) of the Act, 21 U.S.C. section 360bbb-3(b)(1), unless the authorization is terminated or revoked.  Performed at [facility], [address], [place], [place] [address]   Culture, blood (routine x 2)     Status: None   Collection Time: 02/22/21  8:10 PM   Specimen: BLOOD LEFT HAND  Result Value Ref Range Status   Specimen Description BLOOD LEFT HAND  Final   Special Requests   Final    BOTTLES DRAWN AEROBIC AND ANAEROBIC Blood Culture adequate  volume   Culture   Final    NO GROWTH 5 DAYS Performed at [facility], [address] [place], [place] [address]    Report Status 02/27/2021 FINAL  Final  Culture, blood (routine x 2)     Status: None   Collection Time: 02/22/21  8:20 PM   Specimen: BLOOD LEFT FOREARM  Result Value Ref Range Status   Specimen Description BLOOD LEFT FOREARM  Final   Special Requests   Final    BOTTLES DRAWN AEROBIC ONLY Blood Culture results may not be optimal due to an inadequate volume of blood received in culture bottles   Culture   Final    NO GROWTH 5 DAYS Performed at [facility], [address] [place], [place] [address]    Report Status 02/27/2021 FINAL  Final     Labs: BNP (last 3 results) No results for input(s): BNP in the last 8760 hours. Basic Metabolic Panel: Recent Labs  Lab 02/23/21 0553 02/24/21 0203 02/25/21 0345 02/26/21 0355 02/27/21 0410  NA 139 134* 135 134* 133*  K 4.1 3.9 4.1 3.7 3.5  CL 108 103  101 101 102  CO2 19* 19* 18* 20* 21*  GLUCOSE 224* 143* 150* 92 97  BUN 15 19 32* 37* 27*  CREATININE 0.95 1.22 1.31* 1.38* 1.04  CALCIUM 9.6 9.7 9.7 9.5 8.9   Liver Function Tests: Recent Labs  Lab 02/22/21 1742 02/24/21 0203 02/25/21 0345 02/26/21 0355 02/27/21 0410  AST 19 17 13* 11* 10*  ALT ALKPHOS 100 83 82 70 61  BILITOT 0.7 0.8 0.8 0.6 0.7  PROT 8.1 7.3 7.2 6.5 6.1*  ALBUMIN 4.9 4.4 4.4 4.0 3.5   Recent Labs  Lab 02/22/21 1742  LIPASE 19   No results for input(s): AMMONIA in the last 168 hours. CBC: Recent Labs  Lab 02/22/21 1742 02/22/21 1811 02/23/21 0553 02/24/21 0203 02/25/21 0345 02/26/21 0355  WBC 9.7  --  8.7 10.3 7.1 5.0  NEUTROABS 8.7*  --   --  7.9* 5.2 3.3  HGB 15.5 17.0  17.0 14.8 15.0 15.7 15.0  HCT 49.1 50.0  50.0 46.1 47.2 47.5 45.4  MCV 83.9  --  81.7 82.4 80.4 79.6*  PLT 104*  --  97* 107* 121* 113*   Cardiac Enzymes: No results for input(s): CKTOTAL, CKMB, CKMBINDEX, TROPONINI in the  last 168 hours. BNP: Invalid input(s): POCBNP CBG: Recent Labs  Lab 02/26/21 0552 02/26/21 0914 02/26/21 1631 02/27/21 0002 02/27/21 0617  GLUCAP 93 81 165* 122* 100*   D-Dimer No results for input(s): DDIMER in the last 72 hours. Hgb A1c No results for input(s): HGBA1C in the last 72 hours. Lipid Profile No results for input(s): CHOL, HDL, LDLCALC, TRIG, CHOLHDL, LDLDIRECT in the last 72 hours. Thyroid function studies No results for input(s): TSH, T4TOTAL, T3FREE, THYROIDAB in the last 72 hours.  Invalid input(s): FREET3 Anemia work up No results for input(s): VITAMINB12, FOLATE, FERRITIN, TIBC, IRON, RETICCTPCT in the last 72 hours. Urinalysis    Component Value Date/Time   COLORURINE YELLOW 02/22/2021 1652   APPEARANCEUR CLEAR 02/22/2021 1652   LABSPEC 1.017 02/22/2021 1652   PHURINE 5.0 02/22/2021 1652   GLUCOSEU >=500 (A) 02/22/2021 1652   HGBUR SMALL (A) 02/22/2021 1652   BILIRUBINUR NEGATIVE 02/22/2021 1652   KETONESUR 20 (A) 02/22/2021 1652   PROTEINUR >=300 (A) 02/22/2021 1652   UROBILINOGEN 1.0 09/29/2013 2339   NITRITE NEGATIVE 02/22/2021 1652   LEUKOCYTESUR NEGATIVE 02/22/2021 1652   Sepsis Labs Invalid input(s): PROCALCITONIN,  WBC,  LACTICIDVEN Microbiology Recent Results (from the past 240 hour(s))  Resp Panel by RT-PCR (Flu A&B, Covid) Nasopharyngeal Swab     Status: None   Collection Time: 02/22/21  7:00 PM   Specimen: Nasopharyngeal Swab; Nasopharyngeal(NP) swabs in vial transport medium  Result Value Ref Range Status   SARS Coronavirus 2 by RT PCR NEGATIVE NEGATIVE Final    Comment: (NOTE) SARS-CoV-2 target nucleic acids are NOT DETECTED.  The SARS-CoV-2 RNA is generally detectable in upper respiratory specimens during the acute phase of infection. The lowest concentration of SARS-CoV-2 viral copies this assay can detect is 138 copies/mL. A negative result does not preclude SARS-Cov-2 infection and should not be used as the sole basis for  treatment or other patient management decisions. A negative result may occur with  improper specimen collection/handling, submission of specimen other than nasopharyngeal swab, presence of viral mutation(s) within the areas targeted by this assay, and inadequate number of viral copies(<138 copies/mL). A negative result must be combined with clinical observations, patient history, and epidemiological information. The expected result is  Negative.  Fact Sheet for Patients:  BloggerCourse.com  Fact Sheet for Healthcare Providers:  SeriousBroker.it  This test is no t yet approved or cleared by the [place] FDA and  has been authorized for detection and/or diagnosis of SARS-CoV-2 by FDA under an Emergency Use Authorization (EUA). This EUA will remain  in effect (meaning this test can be used) for the duration of the COVID-19 declaration under Section 564(b)(1) of the Act, 21 U.S.C.section 360bbb-3(b)(1), unless the authorization is terminated  or revoked sooner.       Influenza A by PCR NEGATIVE NEGATIVE Final   Influenza B by PCR NEGATIVE NEGATIVE Final    Comment: (NOTE) The Xpert Xpress SARS-CoV-2/FLU/RSV plus assay is intended as an aid in the diagnosis of influenza from Nasopharyngeal swab specimens and should not be used as a sole basis for treatment. Nasal washings and aspirates are unacceptable for Xpert Xpress SARS-CoV-2/FLU/RSV testing.  Fact Sheet for Patients: BloggerCourse.com  Fact Sheet for Healthcare Providers: SeriousBroker.it  This test is not yet approved or cleared by the [place] FDA and has been authorized for detection and/or diagnosis of SARS-CoV-2 by FDA under an Emergency Use Authorization (EUA). This EUA will remain in effect (meaning this test can be used) for the duration of the COVID-19 declaration under Section 564(b)(1) of the Act, 21  U.S.C. section 360bbb-3(b)(1), unless the authorization is terminated or revoked.  Performed at [facility], [address] [place], [place] [address]   Culture, blood (routine x 2)     Status: None   Collection Time: 02/22/21  8:10 PM   Specimen: BLOOD LEFT HAND  Result Value Ref Range Status   Specimen Description BLOOD LEFT HAND  Final   Special Requests   Final    BOTTLES DRAWN AEROBIC AND ANAEROBIC Blood Culture adequate volume   Culture   Final    NO GROWTH 5 DAYS Performed at [facility], [address] [place], [place] [address]    Report Status 02/27/2021 FINAL  Final  Culture, blood (routine x 2)     Status: None   Collection Time: 02/22/21  8:20 PM   Specimen: BLOOD LEFT FOREARM  Result Value Ref Range Status   Specimen Description BLOOD LEFT FOREARM  Final   Special Requests   Final    BOTTLES DRAWN AEROBIC ONLY Blood Culture results may not be optimal due to an inadequate volume of blood received in culture bottles   Culture   Final    NO GROWTH 5 DAYS Performed at [facility], [address] [place], [place] [address]    Report Status 02/27/2021 FINAL  Final     Time coordinating discharge: Over 30 minutes  SIGNED:   Marinda Elk, MD  [facility] 02/27/2021, 9:36 AM Pager   If 7PM-7AM, please contact night-coverage [email or web address] Password [email or web address]

## 2021-02-27 NOTE — Plan of Care (Signed)

## 2021-02-27 NOTE — Progress Notes (Signed)
D/C instructions given and reviewed. Tele and IV removed, tolerated well. Family will transport home. 

## 2021-06-06 ENCOUNTER — Ambulatory Visit: Payer: Medicare Other | Admitting: Internal Medicine

## 2021-10-02 ENCOUNTER — Emergency Department (HOSPITAL_COMMUNITY)
Admission: EM | Admit: 2021-10-02 | Discharge: 2021-10-02 | Disposition: A | Discharge status: Home / Self Care | Payer: No Typology Code available for payment source | Attending: Emergency Medicine | Admitting: Emergency Medicine

## 2021-10-02 ENCOUNTER — Other Ambulatory Visit: Payer: Self-pay

## 2021-10-02 ENCOUNTER — Encounter (HOSPITAL_COMMUNITY): Payer: Self-pay

## 2021-10-02 ENCOUNTER — Emergency Department (HOSPITAL_COMMUNITY): Payer: No Typology Code available for payment source

## 2021-10-02 DIAGNOSIS — R0602 Shortness of breath: Secondary | ICD-10-CM | POA: Diagnosis not present

## 2021-10-02 DIAGNOSIS — R1110 Vomiting, unspecified: Secondary | ICD-10-CM | POA: Diagnosis not present

## 2021-10-02 DIAGNOSIS — Z20822 Contact with and (suspected) exposure to covid-19: Secondary | ICD-10-CM | POA: Diagnosis not present

## 2021-10-02 DIAGNOSIS — Z5321 Procedure and treatment not carried out due to patient leaving prior to being seen by health care provider: Secondary | ICD-10-CM | POA: Insufficient documentation

## 2021-10-02 LAB — COMPREHENSIVE METABOLIC PANEL
ALT: 11 U/L (ref 0–44)
AST: 15 U/L (ref 15–41)
Albumin: 4.9 g/dL (ref 3.5–5.0)
Alkaline Phosphatase: 81 U/L (ref 38–126)
Anion gap: 12 (ref 5–15)
BUN: 43 mg/dL — ABNORMAL HIGH (ref 8–23)
CO2: 25 mmol/L (ref 22–32)
Calcium: 9.4 mg/dL (ref 8.9–10.3)
Chloride: 94 mmol/L — ABNORMAL LOW (ref 98–111)
Creatinine, Ser: 1.25 mg/dL — ABNORMAL HIGH (ref 0.61–1.24)
GFR, Estimated: >60 mL/min (ref >60)
Glucose, Bld: 99 mg/dL (ref 70–99)
Potassium: 3.4 mmol/L — ABNORMAL LOW (ref 3.5–5.1)
Sodium: 131 mmol/L — ABNORMAL LOW (ref 135–145)
Total Bilirubin: 1.7 mg/dL — ABNORMAL HIGH (ref 0.3–1.2)
Total Protein: 7.7 g/dL (ref 6.5–8.1)

## 2021-10-02 LAB — CBC WITH DIFFERENTIAL/PLATELET
Abs Immature Granulocytes: 0.22 10*3/uL — ABNORMAL HIGH (ref 0.00–0.07)
Basophils Absolute: 0.1 10*3/uL (ref 0.0–0.1)
Basophils Relative: 0 %
Eosinophils Absolute: 0 10*3/uL (ref 0.0–0.5)
Eosinophils Relative: 0 %
HCT: 50.2 % (ref 39.0–52.0)
Hemoglobin: 16.7 g/dL (ref 13.0–17.0)
Immature Granulocytes: 2 %
Lymphocytes Relative: 17 %
Lymphs Abs: 2.1 10*3/uL (ref 0.7–4.0)
MCH: 27.2 pg (ref 26.0–34.0)
MCHC: 33.3 g/dL (ref 30.0–36.0)
MCV: 81.6 fL (ref 80.0–100.0)
Monocytes Absolute: 1.3 10*3/uL — ABNORMAL HIGH (ref 0.1–1.0)
Monocytes Relative: 10 %
Neutro Abs: 8.6 10*3/uL — ABNORMAL HIGH (ref 1.7–7.7)
Neutrophils Relative %: 71 %
Platelets: 149 10*3/uL — ABNORMAL LOW (ref 150–400)
RBC: 6.15 MIL/uL — ABNORMAL HIGH (ref 4.22–5.81)
RDW: 15.4 % (ref 11.5–15.5)
WBC: 12.2 10*3/uL — ABNORMAL HIGH (ref 4.0–10.5)
nRBC: 0 % (ref 0.0–0.2)

## 2021-10-02 LAB — RESP PANEL BY RT-PCR (FLU A&B, COVID) ARPGX2
Influenza A by PCR: NEGATIVE
Influenza B by PCR: NEGATIVE
SARS Coronavirus 2 by RT PCR: NEGATIVE

## 2021-10-02 LAB — LIPASE, BLOOD: Lipase: 21 U/L (ref 11–51)

## 2021-10-02 LAB — CBG MONITORING, ED: Glucose-Capillary: 105 mg/dL — ABNORMAL HIGH (ref 70–99)

## 2021-10-02 MED ORDER — ALBUTEROL SULFATE HFA 108 (90 BASE) MCG/ACT IN AERS: 2.0000 | INHALATION_SPRAY | RESPIRATORY_TRACT | Status: DC | PRN

## 2021-10-02 NOTE — ED Provider Triage Note (Signed)
Emergency Medicine Provider Triage Evaluation Note  [name]. , a 71 y.o. male  was evaluated in triage.  Pt complains of feeling unwell with sob and vomiting for 3-4 days after returning from a funeral in [place]   Review of Systems  Positive: Vomiting, sob Negative: Fever, abdominal pain  Physical Exam  BP (!) 111/58 (BP Location: Left Arm)    Pulse (!) 101    Temp 97.8 F (36.6 C) (Oral)    Resp 15    SpO2 98%  Gen:   Awake, no distress   Resp:  Normal effort  MSK:   Moves extremities without difficulty   Medical Decision Making  Medically screening exam initiated at 3:59 AM.  Appropriate orders placed.  [name]. was informed that the remainder of the evaluation will be completed by another provider, this initial triage assessment does not replace that evaluation, and the importance of remaining in the ED until their evaluation is complete.     [name] Fossa, MD 10/02/21 [phone]

## 2021-10-02 NOTE — ED Triage Notes (Signed)
Patient arrived with complaints of shortness of breath over the last week that worsened over night. Chronic complaints of back pain and nausea patient feels are now exacerbated.

## 2022-10-15 ENCOUNTER — Encounter: Payer: Self-pay | Admitting: Orthopedic Surgery

## 2022-10-15 ENCOUNTER — Ambulatory Visit: Payer: Medicare Other | Admitting: Orthopedic Surgery

## 2022-10-15 ENCOUNTER — Ambulatory Visit (INDEPENDENT_AMBULATORY_CARE_PROVIDER_SITE_OTHER): Payer: Medicare Other

## 2022-10-15 DIAGNOSIS — M79671 Pain in right foot: Secondary | ICD-10-CM | POA: Diagnosis not present

## 2022-10-15 DIAGNOSIS — L97511 Non-pressure chronic ulcer of other part of right foot limited to breakdown of skin: Secondary | ICD-10-CM | POA: Diagnosis not present

## 2022-10-15 NOTE — Progress Notes (Signed)
Office Visit Note   Patient: [name].           Date of Birth: 1951-02-12           MRN: [id number] Visit Date: 10/15/2022              Requested by: [name], MD [address] [place],  [place] [address] PCP: [name], MD  Chief Complaint  Patient presents with   Right Foot - Wound Check      HPI: Patient is a 72 year old gentleman who presents for evaluation for an ulcer beneath the right great toe first metatarsal head.  Patient states he has noticed some dark skin color changes to his foot.  Assessment & Plan: Visit Diagnoses:  1. Pain in right foot   2. Non-pressure chronic ulcer of other part of right foot limited to breakdown of skin (HCC)     Plan: Patient's last ABI was in 2016 which showed monophasic flow with calcification of the vessels.  Will repeat an ABI at this time.  Follow-Up Instructions: Return in about 3 weeks (around 11/05/2022).   Ortho Exam  Patient is alert, oriented, no adenopathy, well-dressed, normal affect, normal respiratory effort. Examination of the right foot patient does not have a palpable dorsalis pedis or posterior tibial pulse.  There is some darkened skin color changes mild ischemic changes but no cellulitis.  There is a Wagner grade 1 ulcer beneath the right first metatarsal head.  After informed consent a 10 blade knife was used debride the skin and soft tissue back to healthy viable granulation tissue.  Ulcer is 2 cm in diameter and 2 mm in depth after debridement.  There is no tunneling.  Imaging: XR Foot Complete Right  Result Date: 10/15/2022 Three-view radiographs of the right foot shows periarticular cystic changes of the great toe MTP joint.  There is severe peripheral vascular disease with calcification of the arteries out to the toes.  No images are attached to the encounter.  Labs: Lab Results  Component Value Date   HGBA1C 6.9 (H) 02/23/2021   HGBA1C 6.5 (A) 12/04/2020   HGBA1C 7.9 (A) 07/22/2020    REPTSTATUS 02/27/2021 FINAL 02/22/2021   GRAMSTAIN  01/13/2015    MODERATE WBC PRESENT, PREDOMINANTLY PMN FEW SQUAMOUS EPITHELIAL CELLS PRESENT MODERATE GRAM POSITIVE COCCI IN PAIRS IN CHAINS IN CLUSTERS Performed at [facility]    CULT  02/22/2021    NO GROWTH 5 DAYS Performed at [facility], [address], [place], [place] [address]    Jonita Albee 01/13/2015     Lab Results  Component Value Date   ALBUMIN 4.9 10/02/2021   ALBUMIN 3.5 02/27/2021   ALBUMIN 4.0 02/26/2021    Lab Results  Component Value Date   MG 2.0 04/24/2019   MG 1.8 07/28/2018   MG 2.1 07/27/2018   No results found for: "VD25OH"  No results found for: "PREALBUMIN"    Latest Ref Rng & Units 10/02/2021    4:30 AM 02/26/2021    3:55 AM 02/25/2021    3:45 AM  CBC EXTENDED  WBC 4.0 - 10.5 K/uL 12.2  5.0  7.1   RBC 4.22 - 5.81 MIL/uL 6.15  5.70  5.91   Hemoglobin 13.0 - 17.0 g/dL 82.9  56.2  13.0   HCT 39.0 - 52.0 % 50.2  45.4  47.5   Platelets 150 - 400 K/uL 149  113  121   NEUT# 1.7 - 7.7  K/uL 8.6  3.3  5.2   Lymph# 0.7 - 4.0 K/uL 2.1  0.8  1.0      There is no height or weight on file to calculate BMI.  Orders:  Orders Placed This Encounter  Procedures   XR Foot Complete Right   VAS [place] ABI WITH/WO TBI   No orders of the defined types were placed in this encounter.    Procedures: No procedures performed  Clinical Data: No additional findings.  ROS:  All other systems negative, except as noted in the HPI. Review of Systems  Objective: Vital Signs: There were no vitals taken for this visit.  Specialty Comments:  No specialty comments available.  PMFS History: Patient Active Problem List   Diagnosis Date Noted   Gastroparesis 02/23/2021   Pancreas transplant status ([place]) 02/22/2021   Hypertension    COVID-19 virus infection 09/2020   History of left below knee amputation ([place]) 07/22/2020   Type 2 diabetes mellitus with diabetic polyneuropathy,  with long-term current use of insulin ([place]) 07/22/2020   History of simultaneous kidney and pancreas transplant ([place]) 07/22/2020   Dyslipidemia 07/22/2020   DKA (diabetic ketoacidoses) 01/25/2020   OSA on CPAP 09/13/2018   Intractable nausea and vomiting 09/12/2018   Kidney transplant recipient 09/12/2018   DKA, type 1 ([place]) 01/05/2018   Nausea and vomiting 10/19/2017   Nausea & vomiting 10/18/2017   Hematemesis 07/24/2017   Hypertensive urgency 07/24/2017   Acute GI bleeding 07/24/2017   Status post below knee amputation of left lower extremity ([place]) 02/22/2015   S/P BKA (below knee amputation) unilateral ([place]) 02/20/2015   Ischemic ulcer of toe ([place])    Peripheral vascular disease ([place])    PVD (peripheral vascular disease) ([place]) 01/17/2015   Cellulitis and abscess of foot 01/13/2015   Cellulitis and abscess of foot excluding toe 01/13/2015   Diabetes mellitus without complication ([place]) 15/40/0867   Leukocytosis 01/13/2015   Diabetes mellitus with peripheral vascular disease ([place]) 12/19/2007   DYSLIPIDEMIA 12/19/2007   ANEMIA 12/19/2007   ANXIETY 12/19/2007   Essential hypertension 12/19/2007   TRANSIENT ISCHEMIC ATTACK 12/19/2007   PERIPHERAL VASCULAR DISEASE 12/19/2007   RENAL FAILURE 12/19/2007   Past Medical History:  Diagnosis Date   Anxiety    COVID-19 virus infection 09/2020   CPAP (continuous positive airway pressure) dependence    Diabetes mellitus without complication (HCC)    GERD (gastroesophageal reflux disease)    Hypertension    Legally blind in left eye, as defined in [place]    PTSD (post-traumatic stress disorder)    PVD (peripheral vascular disease) ([place]) 01/17/2015   Renal disorder    Sleep apnea    Thyroid disease     Family History  Problem Relation Age of Onset   Hypertension Other     Past Surgical History:  Procedure Laterality Date   ABDOMINAL AORTAGRAM N/A 01/21/2015   Procedure: ABDOMINAL Maxcine Ham;  Surgeon: Angelia Mould, MD;   Location: [facility] CATH LAB;  Service: Cardiovascular;  Laterality: N/A;   AMPUTATION Left 01/23/2015   Procedure: FIRST RAY AMPUTATION/LEFT FOOT;  Surgeon: Newt Minion, MD;  Location: [place];  Service: Orthopedics;  Laterality: Left;   AMPUTATION Left 02/20/2015   Procedure: AMPUTATION BELOW KNEE;  Surgeon: Newt Minion, MD;  Location: [place];  Service: Orthopedics;  Laterality: Left;   AV FISTULA PLACEMENT     BALLOON DILATION N/A 02/26/2021   Procedure: BALLOON DILATION;  Surgeon: Irene Shipper, MD;  Location: [place];  Service: Endoscopy;  Laterality: N/A;   BIOPSY  02/26/2021   Procedure: BIOPSY;  Surgeon: Irene Shipper, MD;  Location: [facility] ENDOSCOPY;  Service: Endoscopy;;   COLONOSCOPY     COMBINED KIDNEY-PANCREAS TRANSPLANT Left 2009   ESOPHAGOGASTRODUODENOSCOPY (EGD) WITH PROPOFOL N/A 02/26/2021   Procedure: ESOPHAGOGASTRODUODENOSCOPY (EGD) WITH PROPOFOL;  Surgeon: Irene Shipper, MD;  Location: [facility] ENDOSCOPY;  Service: Endoscopy;  Laterality: N/A;   INCISION AND DRAINAGE ABSCESS Left 01/13/2015   Procedure: INCISION AND DRAINAGE OF LEFT FOOT FIRST RAY ABCESS;  Surgeon: Mcarthur Rossetti, MD;  Location: [place];  Service: Orthopedics;  Laterality: Left;   LOWER EXTREMITY ANGIOGRAM Left 01/21/2015   Procedure: LOWER EXTREMITY ANGIOGRAM;  Surgeon: Angelia Mould, MD;  Location: [facility] CATH LAB;  Service: Cardiovascular;  Laterality: Left;   Social History   Occupational History   Not on file  Tobacco Use   Smoking status: Former   Smokeless tobacco: Never   Tobacco comments:    quit i [phone]  Substance and Sexual Activity   Alcohol use: Yes    Comment: 1 beer rarely    Drug use: No   Sexual activity: Not on file

## 2022-11-05 ENCOUNTER — Ambulatory Visit: Payer: Medicare Other | Admitting: Orthopedic Surgery

## 2022-12-07 ENCOUNTER — Ambulatory Visit (INDEPENDENT_AMBULATORY_CARE_PROVIDER_SITE_OTHER): Payer: Medicare PPO | Admitting: Orthopedic Surgery

## 2022-12-07 ENCOUNTER — Encounter: Payer: Self-pay | Admitting: Orthopedic Surgery

## 2022-12-07 DIAGNOSIS — L97511 Non-pressure chronic ulcer of other part of right foot limited to breakdown of skin: Secondary | ICD-10-CM

## 2022-12-07 NOTE — Progress Notes (Signed)
Office Visit Note   Patient: [name].           Date of Birth: [birth year]           MRN: [id number] Visit Date: 12/07/2022              Requested by: Michael Boston, MD [address] [place],   [address] PCP: Michael Boston, MD  Chief Complaint  Patient presents with   Right Foot - Wound Check    Right GT ulcer      HPI: Patient is a 72 year old gentleman who presents in follow-up for Wagner grade 1 ulcer plantar aspect right great toe.  Patient's last ankle-brachial indices were in 2016.  Assessment & Plan: Visit Diagnoses:  1. Non-pressure chronic ulcer of other part of right foot limited to breakdown of skin ([place])     Plan: Patient has a strong palpable anterior tibial pulse we will hold on ankle-brachial indices at this time the Wagner grade 1 ulcer has no ischemic changes.  Continue pressure offloading.  Follow-Up Instructions: Return in about 4 weeks (around 01/04/2023).   Ortho Exam  Patient is alert, oriented, no adenopathy, well-dressed, normal affect, normal respiratory effort. Examination patient has a strong palpable anterior tibial pulse and a palpable posterior tibial pulse.  He has a Wagner grade 1 ulcer beneath the right great toe.  After informed consent a 10 blade knife was used to debride the skin and soft tissue back to healthy viable granulation tissue the wound is 2 cm in diameter 1 mm deep there is no undermining no tunneling no ischemic changes no abscess.  Imaging: No results found. No images are attached to the encounter.  Labs: Lab Results  Component Value Date   HGBA1C 6.9 (H) 02/23/2021   HGBA1C 6.5 (A) 12/04/2020   HGBA1C 7.9 (A) 07/22/2020   REPTSTATUS 02/27/2021 FINAL 02/22/2021   GRAMSTAIN  01/13/2015    MODERATE WBC PRESENT, PREDOMINANTLY PMN FEW SQUAMOUS EPITHELIAL CELLS PRESENT MODERATE GRAM POSITIVE COCCI IN PAIRS IN CHAINS IN CLUSTERS Performed at [facility]  02/22/2021    NO GROWTH 5  DAYS Performed at [facility] [address] [place],  [address]    Carmine Savoy 01/13/2015     Lab Results  Component Value Date   ALBUMIN 4.9 10/02/2021   ALBUMIN 3.5 02/27/2021   ALBUMIN 4.0 02/26/2021    Lab Results  Component Value Date   MG 2.0 04/24/2019   MG 1.8 07/28/2018   MG 2.1 07/27/2018   No results found for: "VD25OH"  No results found for: "PREALBUMIN"    Latest Ref Rng & Units 10/02/2021    4:30 AM 02/26/2021    3:55 AM 02/25/2021    3:45 AM  CBC EXTENDED  WBC 4.0 - 10.5 K/uL 12.2  5.0  7.1   RBC 4.22 - 5.81 MIL/uL 6.15  5.70  5.91   Hemoglobin 13.0 - 17.0 g/dL 16.7  15.0  15.7   HCT 39.0 - 52.0 % 50.2  45.4  47.5   Platelets 150 - 400 K/uL 149  113  121   NEUT# 1.7 - 7.7 K/uL 8.6  3.3  5.2   Lymph# 0.7 - 4.0 K/uL 2.1  0.8  1.0      There is no height or weight on file to calculate BMI.  Orders:  No orders of the defined types were placed in this encounter.  No orders of the  defined types were placed in this encounter.    Procedures: No procedures performed  Clinical Data: No additional findings.  ROS:  All other systems negative, except as noted in the HPI. Review of Systems  Objective: Vital Signs: There were no vitals taken for this visit.  Specialty Comments:  No specialty comments available.  PMFS History: Patient Active Problem List   Diagnosis Date Noted   Gastroparesis 02/23/2021   Pancreas transplant status ([place]) 02/22/2021   Hypertension    COVID-19 virus infection 09/2020   History of left below knee amputation ([place]) 07/22/2020   Type 2 diabetes mellitus with diabetic polyneuropathy, with long-term current use of insulin ([place]) 07/22/2020   History of simultaneous kidney and pancreas transplant ([place]) 07/22/2020   Dyslipidemia 07/22/2020   DKA (diabetic ketoacidoses) 01/25/2020   OSA on CPAP 09/13/2018   Intractable nausea and vomiting 09/12/2018   Kidney transplant recipient 09/12/2018    DKA, type 1 ([place]) 01/05/2018   Nausea and vomiting 10/19/2017   Nausea & vomiting 10/18/2017   Hematemesis 07/24/2017   Hypertensive urgency 07/24/2017   Acute GI bleeding 07/24/2017   Status post below knee amputation of left lower extremity ([place]) 02/22/2015   S/P BKA (below knee amputation) unilateral ([place]) 02/20/2015   Ischemic ulcer of toe ([place])    Peripheral vascular disease ([place])    PVD (peripheral vascular disease) ([place]) 01/17/2015   Cellulitis and abscess of foot 01/13/2015   Cellulitis and abscess of foot excluding toe 01/13/2015   Diabetes mellitus without complication ([place]) 0000000   Leukocytosis 01/13/2015   Diabetes mellitus with peripheral vascular disease ([place]) 12/19/2007   DYSLIPIDEMIA 12/19/2007   ANEMIA 12/19/2007   ANXIETY 12/19/2007   Essential hypertension 12/19/2007   TRANSIENT ISCHEMIC ATTACK 12/19/2007   PERIPHERAL VASCULAR DISEASE 12/19/2007   RENAL FAILURE 12/19/2007   Past Medical History:  Diagnosis Date   Anxiety    COVID-19 virus infection 09/2020   CPAP (continuous positive airway pressure) dependence    Diabetes mellitus without complication (HCC)    GERD (gastroesophageal reflux disease)    Hypertension    Legally blind in left eye, as defined in [place]    PTSD (post-traumatic stress disorder)    PVD (peripheral vascular disease) ([place]) 01/17/2015   Renal disorder    Sleep apnea    Thyroid disease     Family History  Problem Relation Age of Onset   Hypertension Other     Past Surgical History:  Procedure Laterality Date   ABDOMINAL AORTAGRAM N/A 01/21/2015   Procedure: ABDOMINAL Maxcine Ham;  Surgeon: Angelia Mould, MD;  Location: [facility] CATH LAB;  Service: Cardiovascular;  Laterality: N/A;   AMPUTATION Left 01/23/2015   Procedure: FIRST RAY AMPUTATION/LEFT FOOT;  Surgeon: Newt Minion, MD;  Location: [place];  Service: Orthopedics;  Laterality: Left;   AMPUTATION Left 02/20/2015   Procedure: AMPUTATION BELOW KNEE;  Surgeon: Newt Minion, MD;  Location: [place];  Service: Orthopedics;  Laterality: Left;   AV FISTULA PLACEMENT     BALLOON DILATION N/A 02/26/2021   Procedure: BALLOON DILATION;  Surgeon: Irene Shipper, MD;  Location: [facility] ENDOSCOPY;  Service: Endoscopy;  Laterality: N/A;   BIOPSY  02/26/2021   Procedure: BIOPSY;  Surgeon: Irene Shipper, MD;  Location: [facility] ENDOSCOPY;  Service: Endoscopy;;   COLONOSCOPY     COMBINED KIDNEY-PANCREAS TRANSPLANT Left 2009   ESOPHAGOGASTRODUODENOSCOPY (EGD) WITH PROPOFOL N/A 02/26/2021   Procedure: ESOPHAGOGASTRODUODENOSCOPY (EGD) WITH PROPOFOL;  Surgeon: Irene Shipper, MD;  Location:  [facility] ENDOSCOPY;  Service: Endoscopy;  Laterality: N/A;   INCISION AND DRAINAGE ABSCESS Left 01/13/2015   Procedure: INCISION AND DRAINAGE OF LEFT FOOT FIRST RAY ABCESS;  Surgeon: Mcarthur Rossetti, MD;  Location: [place];  Service: Orthopedics;  Laterality: Left;   LOWER EXTREMITY ANGIOGRAM Left 01/21/2015   Procedure: LOWER EXTREMITY ANGIOGRAM;  Surgeon: Angelia Mould, MD;  Location: [facility] CATH LAB;  Service: Cardiovascular;  Laterality: Left;   Social History   Occupational History   Not on file  Tobacco Use   Smoking status: Former   Smokeless tobacco: Never   Tobacco comments:    quit i [phone]  Substance and Sexual Activity   Alcohol use: Yes    Comment: 1 beer rarely    Drug use: No   Sexual activity: Not on file

## 2023-01-04 ENCOUNTER — Ambulatory Visit: Payer: Medicare PPO | Admitting: Orthopedic Surgery

## 2023-02-16 ENCOUNTER — Ambulatory Visit: Payer: Medicare HMO | Admitting: Family

## 2023-02-16 ENCOUNTER — Encounter: Payer: Self-pay | Admitting: Family

## 2023-02-16 DIAGNOSIS — L97511 Non-pressure chronic ulcer of other part of right foot limited to breakdown of skin: Secondary | ICD-10-CM

## 2023-02-16 MED ORDER — DOXYCYCLINE HYCLATE 100 MG PO TABS: 100.0000 mg | ORAL_TABLET | Freq: Two times a day (BID) | ORAL | 0 refills | Status: DC

## 2023-02-16 NOTE — Progress Notes (Signed)
Office Visit Note   Patient: [name].           Date of Birth: 1950/12/12           MRN: [id number] Visit Date: 02/16/2023              Requested by: [name], MD [address] [place],  [place] [address] PCP: [name], MD  Chief Complaint  Patient presents with   Right Foot - Follow-up      HPI: The patient is a 72 year old gentleman who presents in follow-up for Wagner grade 1 ulcer to the right foot today he is concerned for worsening reports of bleeding denies any purulence no fever no chills is in regular shoewear without a dressing.  Is status post below-knee amputation on the left  Assessment & Plan: Visit Diagnoses: No diagnosis found.  Plan: Will place on a course of doxycycline begin daily dose of cleansing dry dressings Follow-Up Instructions: No follow-ups on file.   Ortho Exam  Patient is alert, oriented, no adenopathy, well-dressed, normal affect, normal respiratory effort. On examination of the right foot beneath the first metatarsal head he has Wagner grade 1 ulcer this is 1 cm in diameter with 5 mm of depth there is central ulceration scant bloody drainage no surrounding erythema this was debrided with a 10 blade knife of nonviable tissue back to viable tissue there is a foul odor no purulence no erythema  Imaging: No results found. No images are attached to the encounter.  Labs: Lab Results  Component Value Date   HGBA1C 6.9 (H) 02/23/2021   HGBA1C 6.5 (A) 12/04/2020   HGBA1C 7.9 (A) 07/22/2020   REPTSTATUS 02/27/2021 FINAL 02/22/2021   GRAMSTAIN  01/13/2015    MODERATE WBC PRESENT, PREDOMINANTLY PMN FEW SQUAMOUS EPITHELIAL CELLS PRESENT MODERATE GRAM POSITIVE COCCI IN PAIRS IN CHAINS IN CLUSTERS Performed at [facility]    CULT  02/22/2021    NO GROWTH 5 DAYS Performed at [facility], [address] [place], [place] [address]    Jonita Albee 01/13/2015     Lab Results  Component Value Date    ALBUMIN 4.9 10/02/2021   ALBUMIN 3.5 02/27/2021   ALBUMIN 4.0 02/26/2021    Lab Results  Component Value Date   MG 2.0 04/24/2019   MG 1.8 07/28/2018   MG 2.1 07/27/2018   No results found for: "VD25OH"  No results found for: "PREALBUMIN"    Latest Ref Rng & Units 10/02/2021    4:30 AM 02/26/2021    3:55 AM 02/25/2021    3:45 AM  CBC EXTENDED  WBC 4.0 - 10.5 K/uL 12.2  5.0  7.1   RBC 4.22 - 5.81 MIL/uL 6.15  5.70  5.91   Hemoglobin 13.0 - 17.0 g/dL 28.4  13.2  44.0   HCT 39.0 - 52.0 % 50.2  45.4  47.5   Platelets 150 - 400 K/uL 149  113  121   NEUT# 1.7 - 7.7 K/uL 8.6  3.3  5.2   Lymph# 0.7 - 4.0 K/uL 2.1  0.8  1.0      There is no height or weight on file to calculate BMI.  Orders:  No orders of the defined types were placed in this encounter.  No orders of the defined types were placed in this encounter.    Procedures: No procedures performed  Clinical Data: No additional findings.  ROS:  All other systems negative, except as noted  in the HPI. Review of Systems  Objective: Vital Signs: There were no vitals taken for this visit.  Specialty Comments:  No specialty comments available.  PMFS History: Patient Active Problem List   Diagnosis Date Noted   Gastroparesis 02/23/2021   Pancreas transplant status (HCC) 02/22/2021   Hypertension    COVID-19 virus infection 09/2020   History of left below knee amputation (HCC) 07/22/2020   Type 2 diabetes mellitus with diabetic polyneuropathy, with long-term current use of insulin (HCC) 07/22/2020   History of simultaneous kidney and pancreas transplant (HCC) 07/22/2020   Dyslipidemia 07/22/2020   DKA (diabetic ketoacidoses) 01/25/2020   OSA on CPAP 09/13/2018   Intractable nausea and vomiting 09/12/2018   Kidney transplant recipient 09/12/2018   DKA, type 1 (HCC) 01/05/2018   Nausea and vomiting 10/19/2017   Nausea & vomiting 10/18/2017   Hematemesis 07/24/2017   Hypertensive urgency 07/24/2017   Acute GI  bleeding 07/24/2017   Status post below knee amputation of left lower extremity (HCC) 02/22/2015   S/P BKA (below knee amputation) unilateral (HCC) 02/20/2015   Ischemic ulcer of toe (HCC)    Peripheral vascular disease (HCC)    PVD (peripheral vascular disease) (HCC) 01/17/2015   Cellulitis and abscess of foot 01/13/2015   Cellulitis and abscess of foot excluding toe 01/13/2015   Diabetes mellitus without complication (HCC) 01/13/2015   Leukocytosis 01/13/2015   Diabetes mellitus with peripheral vascular disease (HCC) 12/19/2007   DYSLIPIDEMIA 12/19/2007   ANEMIA 12/19/2007   ANXIETY 12/19/2007   Essential hypertension 12/19/2007   TRANSIENT ISCHEMIC ATTACK 12/19/2007   PERIPHERAL VASCULAR DISEASE 12/19/2007   RENAL FAILURE 12/19/2007   Past Medical History:  Diagnosis Date   Anxiety    COVID-19 virus infection 09/2020   CPAP (continuous positive airway pressure) dependence    Diabetes mellitus without complication (HCC)    GERD (gastroesophageal reflux disease)    Hypertension    Legally blind in left eye, as defined in [place]    PTSD (post-traumatic stress disorder)    PVD (peripheral vascular disease) (HCC) 01/17/2015   Renal disorder    Sleep apnea    Thyroid disease     Family History  Problem Relation Age of Onset   Hypertension Other     Past Surgical History:  Procedure Laterality Date   ABDOMINAL AORTAGRAM N/A 01/21/2015   Procedure: ABDOMINAL Ronny Flurry;  Surgeon: Chuck Hint, MD;  Location: [facility] CATH LAB;  Service: Cardiovascular;  Laterality: N/A;   AMPUTATION Left 01/23/2015   Procedure: FIRST RAY AMPUTATION/LEFT FOOT;  Surgeon: Nadara Mustard, MD;  Location: [facility] OR;  Service: Orthopedics;  Laterality: Left;   AMPUTATION Left 02/20/2015   Procedure: AMPUTATION BELOW KNEE;  Surgeon: Nadara Mustard, MD;  Location: [facility] OR;  Service: Orthopedics;  Laterality: Left;   AV FISTULA PLACEMENT     BALLOON DILATION N/A 02/26/2021   Procedure: BALLOON DILATION;  Surgeon:  Hilarie Fredrickson, MD;  Location: [facility] ENDOSCOPY;  Service: Endoscopy;  Laterality: N/A;   BIOPSY  02/26/2021   Procedure: BIOPSY;  Surgeon: Hilarie Fredrickson, MD;  Location: [facility] ENDOSCOPY;  Service: Endoscopy;;   COLONOSCOPY     COMBINED KIDNEY-PANCREAS TRANSPLANT Left 2009   ESOPHAGOGASTRODUODENOSCOPY (EGD) WITH PROPOFOL N/A 02/26/2021   Procedure: ESOPHAGOGASTRODUODENOSCOPY (EGD) WITH PROPOFOL;  Surgeon: Hilarie Fredrickson, MD;  Location: [facility] ENDOSCOPY;  Service: Endoscopy;  Laterality: N/A;   INCISION AND DRAINAGE ABSCESS Left 01/13/2015   Procedure: INCISION AND DRAINAGE OF LEFT FOOT FIRST RAY ABCESS;  Surgeon:  Kathryne Hitch, MD;  Location: [facility] OR;  Service: Orthopedics;  Laterality: Left;   LOWER EXTREMITY ANGIOGRAM Left 01/21/2015   Procedure: LOWER EXTREMITY ANGIOGRAM;  Surgeon: Chuck Hint, MD;  Location: [facility] CATH LAB;  Service: Cardiovascular;  Laterality: Left;   Social History   Occupational History   Not on file  Tobacco Use   Smoking status: Former   Smokeless tobacco: Never   Tobacco comments:    quit i [phone]  Substance and Sexual Activity   Alcohol use: Yes    Comment: 1 beer rarely    Drug use: No   Sexual activity: Not on file

## 2023-03-02 ENCOUNTER — Ambulatory Visit: Payer: Medicare HMO | Admitting: Family

## 2023-03-09 ENCOUNTER — Ambulatory Visit: Payer: Medicare HMO | Admitting: Family

## 2023-03-10 ENCOUNTER — Ambulatory Visit: Payer: Medicare HMO | Admitting: Family

## 2023-03-23 DIAGNOSIS — D84821 Immunodeficiency due to drugs: Secondary | ICD-10-CM | POA: Diagnosis not present

## 2023-03-23 DIAGNOSIS — E1165 Type 2 diabetes mellitus with hyperglycemia: Secondary | ICD-10-CM | POA: Diagnosis not present

## 2023-03-23 DIAGNOSIS — I700 Atherosclerosis of aorta: Secondary | ICD-10-CM | POA: Diagnosis not present

## 2023-03-23 DIAGNOSIS — Z89512 Acquired absence of left leg below knee: Secondary | ICD-10-CM | POA: Diagnosis not present

## 2023-03-23 DIAGNOSIS — I10 Essential (primary) hypertension: Secondary | ICD-10-CM | POA: Diagnosis not present

## 2023-03-23 DIAGNOSIS — Z794 Long term (current) use of insulin: Secondary | ICD-10-CM | POA: Diagnosis not present

## 2023-03-23 DIAGNOSIS — Z9483 Pancreas transplant status: Secondary | ICD-10-CM | POA: Diagnosis not present

## 2023-03-23 DIAGNOSIS — F339 Major depressive disorder, recurrent, unspecified: Secondary | ICD-10-CM | POA: Diagnosis not present

## 2023-05-26 ENCOUNTER — Encounter: Payer: Self-pay | Admitting: Family

## 2023-05-26 ENCOUNTER — Ambulatory Visit (INDEPENDENT_AMBULATORY_CARE_PROVIDER_SITE_OTHER): Payer: Medicare HMO | Admitting: Family

## 2023-05-26 DIAGNOSIS — I739 Peripheral vascular disease, unspecified: Secondary | ICD-10-CM

## 2023-05-26 DIAGNOSIS — L97511 Non-pressure chronic ulcer of other part of right foot limited to breakdown of skin: Secondary | ICD-10-CM | POA: Diagnosis not present

## 2023-05-26 DIAGNOSIS — E1151 Type 2 diabetes mellitus with diabetic peripheral angiopathy without gangrene: Secondary | ICD-10-CM

## 2023-05-26 NOTE — Progress Notes (Signed)
Office Visit Note   Patient: [name].           Date of Birth: [birth year]           MRN: [id number] Visit Date: 05/26/2023              Requested by: [name], MD [address] [place],  [place] [address] PCP: [name], MD  Chief Complaint  Patient presents with   Right Foot - Open Wound      HPI: The patient is a 72 year old gentleman who presents today for evaluation of ulceration to the right foot plantar aspect.  This been ongoing for months he has been cleansing this with peroxide complains of increased drainage and some discoloration to the toe  Concerned for slow/poor healing denies fever or chills or purulence  Assessment & Plan: Visit Diagnoses: No diagnosis found.  Plan: Continue daily dose of cleansing did provide a felt pressure relieving donut to relieve pressure from this area for his shoe wear he will pack this open with antibacterial ointment and gauze  Follow-Up Instructions: No follow-ups on file.   Ortho Exam  Patient is alert, oriented, no adenopathy, well-dressed, normal affect, normal respiratory effort. On examination of the right foot there is a Wagner grade 1 ulcer beneath the first metatarsal head this is 3 cm in diameter with 5 mm of depth there is no exposed bone or tendon there is surrounding hyperkeratotic tissue was was debrided with a 10 blade knife back to viable tissue there is no surrounding erythema warmth or odor.  There is a mild darkened discoloration of the great toe however this is normothermic.  He does have a palpable dorsalis pedis pulse.  Imaging: No results found. No images are attached to the encounter.  Labs: Lab Results  Component Value Date   HGBA1C 6.9 (H) 02/23/2021   HGBA1C 6.5 (A) 12/04/2020   HGBA1C 7.9 (A) 07/22/2020   REPTSTATUS 02/27/2021 FINAL 02/22/2021   GRAMSTAIN  01/13/2015    MODERATE WBC PRESENT, PREDOMINANTLY PMN FEW SQUAMOUS EPITHELIAL CELLS PRESENT MODERATE GRAM POSITIVE COCCI IN  PAIRS IN CHAINS IN CLUSTERS Performed at [facility]    CULT  02/22/2021    NO GROWTH 5 DAYS Performed at [facility], [address], [place], [place] [address]    Jonita Albee 01/13/2015     Lab Results  Component Value Date   ALBUMIN 4.9 10/02/2021   ALBUMIN 3.5 02/27/2021   ALBUMIN 4.0 02/26/2021    Lab Results  Component Value Date   MG 2.0 04/24/2019   MG 1.8 07/28/2018   MG 2.1 07/27/2018   No results found for: "VD25OH"  No results found for: "PREALBUMIN"    Latest Ref Rng & Units 10/02/2021    4:30 AM 02/26/2021    3:55 AM 02/25/2021    3:45 AM  CBC EXTENDED  WBC 4.0 - 10.5 K/uL 12.2  5.0  7.1   RBC 4.22 - 5.81 MIL/uL 6.15  5.70  5.91   Hemoglobin 13.0 - 17.0 g/dL 82.9  56.2  13.0   HCT 39.0 - 52.0 % 50.2  45.4  47.5   Platelets 150 - 400 K/uL 149  113  121   NEUT# 1.7 - 7.7 K/uL 8.6  3.3  5.2   Lymph# 0.7 - 4.0 K/uL 2.1  0.8  1.0      There is no height or weight on file to calculate BMI.  Orders:  No  orders of the defined types were placed in this encounter.  No orders of the defined types were placed in this encounter.    Procedures: No procedures performed  Clinical Data: No additional findings.  ROS:  All other systems negative, except as noted in the HPI. Review of Systems  Objective: Vital Signs: There were no vitals taken for this visit.  Specialty Comments:  No specialty comments available.  PMFS History: Patient Active Problem List   Diagnosis Date Noted   Gastroparesis 02/23/2021   Pancreas transplant status (HCC) 02/22/2021   Hypertension    COVID-19 virus infection 09/2020   History of left below knee amputation (HCC) 07/22/2020   Type 2 diabetes mellitus with diabetic polyneuropathy, with long-term current use of insulin (HCC) 07/22/2020   History of simultaneous kidney and pancreas transplant (HCC) 07/22/2020   Dyslipidemia 07/22/2020   DKA (diabetic ketoacidoses) 01/25/2020   OSA on CPAP  09/13/2018   Intractable nausea and vomiting 09/12/2018   Kidney transplant recipient 09/12/2018   DKA, type 1 (HCC) 01/05/2018   Nausea and vomiting 10/19/2017   Nausea & vomiting 10/18/2017   Hematemesis 07/24/2017   Hypertensive urgency 07/24/2017   Acute GI bleeding 07/24/2017   Status post below knee amputation of left lower extremity (HCC) 02/22/2015   S/P BKA (below knee amputation) unilateral (HCC) 02/20/2015   Ischemic ulcer of toe (HCC)    Peripheral vascular disease (HCC)    PVD (peripheral vascular disease) (HCC) 01/17/2015   Cellulitis and abscess of foot 01/13/2015   Cellulitis and abscess of foot excluding toe 01/13/2015   Diabetes mellitus without complication (HCC) 01/13/2015   Leukocytosis 01/13/2015   Diabetes mellitus with peripheral vascular disease (HCC) 12/19/2007   DYSLIPIDEMIA 12/19/2007   ANEMIA 12/19/2007   ANXIETY 12/19/2007   Essential hypertension 12/19/2007   TRANSIENT ISCHEMIC ATTACK 12/19/2007   PERIPHERAL VASCULAR DISEASE 12/19/2007   RENAL FAILURE 12/19/2007   Past Medical History:  Diagnosis Date   Anxiety    COVID-19 virus infection 09/2020   CPAP (continuous positive airway pressure) dependence    Diabetes mellitus without complication (HCC)    GERD (gastroesophageal reflux disease)    Hypertension    Legally blind in left eye, as defined in [place]    PTSD (post-traumatic stress disorder)    PVD (peripheral vascular disease) (HCC) 01/17/2015   Renal disorder    Sleep apnea    Thyroid disease     Family History  Problem Relation Age of Onset   Hypertension Other     Past Surgical History:  Procedure Laterality Date   ABDOMINAL AORTAGRAM N/A 01/21/2015   Procedure: ABDOMINAL Ronny Flurry;  Surgeon: Chuck Hint, MD;  Location: [facility] CATH LAB;  Service: Cardiovascular;  Laterality: N/A;   AMPUTATION Left 01/23/2015   Procedure: FIRST RAY AMPUTATION/LEFT FOOT;  Surgeon: Nadara Mustard, MD;  Location: [facility] OR;  Service: Orthopedics;   Laterality: Left;   AMPUTATION Left 02/20/2015   Procedure: AMPUTATION BELOW KNEE;  Surgeon: Nadara Mustard, MD;  Location: [facility] OR;  Service: Orthopedics;  Laterality: Left;   AV FISTULA PLACEMENT     BALLOON DILATION N/A 02/26/2021   Procedure: BALLOON DILATION;  Surgeon: Hilarie Fredrickson, MD;  Location: [facility] ENDOSCOPY;  Service: Endoscopy;  Laterality: N/A;   BIOPSY  02/26/2021   Procedure: BIOPSY;  Surgeon: Hilarie Fredrickson, MD;  Location: [facility] ENDOSCOPY;  Service: Endoscopy;;   COLONOSCOPY     COMBINED KIDNEY-PANCREAS TRANSPLANT Left 2009   ESOPHAGOGASTRODUODENOSCOPY (EGD) WITH PROPOFOL N/A 02/26/2021  Procedure: ESOPHAGOGASTRODUODENOSCOPY (EGD) WITH PROPOFOL;  Surgeon: Hilarie Fredrickson, MD;  Location: [facility] ENDOSCOPY;  Service: Endoscopy;  Laterality: N/A;   INCISION AND DRAINAGE ABSCESS Left 01/13/2015   Procedure: INCISION AND DRAINAGE OF LEFT FOOT FIRST RAY ABCESS;  Surgeon: Kathryne Hitch, MD;  Location: [facility] OR;  Service: Orthopedics;  Laterality: Left;   LOWER EXTREMITY ANGIOGRAM Left 01/21/2015   Procedure: LOWER EXTREMITY ANGIOGRAM;  Surgeon: Chuck Hint, MD;  Location: [facility] CATH LAB;  Service: Cardiovascular;  Laterality: Left;   Social History   Occupational History   Not on file  Tobacco Use   Smoking status: Former   Smokeless tobacco: Never   Tobacco comments:    quit i [phone]  Substance and Sexual Activity   Alcohol use: Yes    Comment: 1 beer rarely    Drug use: No   Sexual activity: Not on file

## 2023-05-28 NOTE — Addendum Note (Signed)
Addended by: Adonis Huguenin on: 05/28/2023 08:24 AM   Modules accepted: Orders

## 2023-06-06 ENCOUNTER — Other Ambulatory Visit: Payer: Self-pay

## 2023-06-06 ENCOUNTER — Encounter (HOSPITAL_COMMUNITY): Payer: Self-pay

## 2023-06-06 ENCOUNTER — Inpatient Hospital Stay (HOSPITAL_COMMUNITY): Payer: Medicare HMO

## 2023-06-06 ENCOUNTER — Emergency Department (HOSPITAL_COMMUNITY): Payer: Medicare HMO

## 2023-06-06 ENCOUNTER — Inpatient Hospital Stay (HOSPITAL_COMMUNITY)
Admission: EM | Admit: 2023-06-06 | Discharge: 2023-06-29 | DRG: 853 | Disposition: A | Discharge status: Rehabilitation Facility | Payer: Medicare HMO | Attending: Internal Medicine | Admitting: Internal Medicine

## 2023-06-06 DIAGNOSIS — I6389 Other cerebral infarction: Secondary | ICD-10-CM | POA: Diagnosis not present

## 2023-06-06 DIAGNOSIS — E1169 Type 2 diabetes mellitus with other specified complication: Secondary | ICD-10-CM | POA: Diagnosis present

## 2023-06-06 DIAGNOSIS — I739 Peripheral vascular disease, unspecified: Secondary | ICD-10-CM | POA: Diagnosis present

## 2023-06-06 DIAGNOSIS — E785 Hyperlipidemia, unspecified: Secondary | ICD-10-CM | POA: Diagnosis present

## 2023-06-06 DIAGNOSIS — I9751 Accidental puncture and laceration of a circulatory system organ or structure during a circulatory system procedure: Secondary | ICD-10-CM | POA: Diagnosis not present

## 2023-06-06 DIAGNOSIS — R6883 Chills (without fever): Secondary | ICD-10-CM | POA: Diagnosis not present

## 2023-06-06 DIAGNOSIS — I6381 Other cerebral infarction due to occlusion or stenosis of small artery: Secondary | ICD-10-CM | POA: Diagnosis not present

## 2023-06-06 DIAGNOSIS — R6510 Systemic inflammatory response syndrome (SIRS) of non-infectious origin without acute organ dysfunction: Secondary | ICD-10-CM | POA: Diagnosis not present

## 2023-06-06 DIAGNOSIS — G8918 Other acute postprocedural pain: Secondary | ICD-10-CM | POA: Diagnosis not present

## 2023-06-06 DIAGNOSIS — Z8616 Personal history of COVID-19: Secondary | ICD-10-CM

## 2023-06-06 DIAGNOSIS — E1151 Type 2 diabetes mellitus with diabetic peripheral angiopathy without gangrene: Secondary | ICD-10-CM | POA: Diagnosis not present

## 2023-06-06 DIAGNOSIS — R112 Nausea with vomiting, unspecified: Secondary | ICD-10-CM | POA: Diagnosis not present

## 2023-06-06 DIAGNOSIS — E440 Moderate protein-calorie malnutrition: Secondary | ICD-10-CM | POA: Insufficient documentation

## 2023-06-06 DIAGNOSIS — R1310 Dysphagia, unspecified: Secondary | ICD-10-CM | POA: Diagnosis not present

## 2023-06-06 DIAGNOSIS — Z5982 Transportation insecurity: Secondary | ICD-10-CM

## 2023-06-06 DIAGNOSIS — H5462 Unqualified visual loss, left eye, normal vision right eye: Secondary | ICD-10-CM | POA: Diagnosis present

## 2023-06-06 DIAGNOSIS — E1165 Type 2 diabetes mellitus with hyperglycemia: Secondary | ICD-10-CM | POA: Diagnosis not present

## 2023-06-06 DIAGNOSIS — Z1152 Encounter for screening for COVID-19: Secondary | ICD-10-CM | POA: Diagnosis not present

## 2023-06-06 DIAGNOSIS — E1152 Type 2 diabetes mellitus with diabetic peripheral angiopathy with gangrene: Secondary | ICD-10-CM | POA: Diagnosis present

## 2023-06-06 DIAGNOSIS — I672 Cerebral atherosclerosis: Secondary | ICD-10-CM | POA: Diagnosis not present

## 2023-06-06 DIAGNOSIS — D84821 Immunodeficiency due to drugs: Secondary | ICD-10-CM | POA: Diagnosis present

## 2023-06-06 DIAGNOSIS — L97509 Non-pressure chronic ulcer of other part of unspecified foot with unspecified severity: Secondary | ICD-10-CM

## 2023-06-06 DIAGNOSIS — K5900 Constipation, unspecified: Secondary | ICD-10-CM

## 2023-06-06 DIAGNOSIS — D539 Nutritional anemia, unspecified: Secondary | ICD-10-CM | POA: Diagnosis present

## 2023-06-06 DIAGNOSIS — E1143 Type 2 diabetes mellitus with diabetic autonomic (poly)neuropathy: Secondary | ICD-10-CM | POA: Diagnosis present

## 2023-06-06 DIAGNOSIS — Z79621 Long term (current) use of calcineurin inhibitor: Secondary | ICD-10-CM

## 2023-06-06 DIAGNOSIS — E11319 Type 2 diabetes mellitus with unspecified diabetic retinopathy without macular edema: Secondary | ICD-10-CM | POA: Diagnosis present

## 2023-06-06 DIAGNOSIS — A419 Sepsis, unspecified organism: Principal | ICD-10-CM | POA: Insufficient documentation

## 2023-06-06 DIAGNOSIS — M869 Osteomyelitis, unspecified: Secondary | ICD-10-CM | POA: Diagnosis not present

## 2023-06-06 DIAGNOSIS — I70261 Atherosclerosis of native arteries of extremities with gangrene, right leg: Secondary | ICD-10-CM | POA: Diagnosis present

## 2023-06-06 DIAGNOSIS — Z9483 Pancreas transplant status: Secondary | ICD-10-CM | POA: Diagnosis not present

## 2023-06-06 DIAGNOSIS — R5383 Other fatigue: Secondary | ICD-10-CM | POA: Diagnosis not present

## 2023-06-06 DIAGNOSIS — R933 Abnormal findings on diagnostic imaging of other parts of digestive tract: Secondary | ICD-10-CM | POA: Diagnosis not present

## 2023-06-06 DIAGNOSIS — R1319 Other dysphagia: Secondary | ICD-10-CM | POA: Diagnosis not present

## 2023-06-06 DIAGNOSIS — G4489 Other headache syndrome: Secondary | ICD-10-CM | POA: Diagnosis not present

## 2023-06-06 DIAGNOSIS — Z89512 Acquired absence of left leg below knee: Secondary | ICD-10-CM

## 2023-06-06 DIAGNOSIS — I517 Cardiomegaly: Secondary | ICD-10-CM | POA: Diagnosis not present

## 2023-06-06 DIAGNOSIS — Z79899 Other long term (current) drug therapy: Secondary | ICD-10-CM

## 2023-06-06 DIAGNOSIS — M86171 Other acute osteomyelitis, right ankle and foot: Secondary | ICD-10-CM | POA: Diagnosis present

## 2023-06-06 DIAGNOSIS — D649 Anemia, unspecified: Secondary | ICD-10-CM | POA: Diagnosis not present

## 2023-06-06 DIAGNOSIS — H539 Unspecified visual disturbance: Secondary | ICD-10-CM | POA: Diagnosis not present

## 2023-06-06 DIAGNOSIS — R509 Fever, unspecified: Secondary | ICD-10-CM | POA: Diagnosis not present

## 2023-06-06 DIAGNOSIS — K222 Esophageal obstruction: Secondary | ICD-10-CM | POA: Diagnosis not present

## 2023-06-06 DIAGNOSIS — K3184 Gastroparesis: Secondary | ICD-10-CM | POA: Diagnosis present

## 2023-06-06 DIAGNOSIS — L97419 Non-pressure chronic ulcer of right heel and midfoot with unspecified severity: Secondary | ICD-10-CM | POA: Diagnosis present

## 2023-06-06 DIAGNOSIS — B3781 Candidal esophagitis: Secondary | ICD-10-CM | POA: Diagnosis present

## 2023-06-06 DIAGNOSIS — I10 Essential (primary) hypertension: Secondary | ICD-10-CM | POA: Diagnosis not present

## 2023-06-06 DIAGNOSIS — Z6822 Body mass index (BMI) 22.0-22.9, adult: Secondary | ICD-10-CM

## 2023-06-06 DIAGNOSIS — E8809 Other disorders of plasma-protein metabolism, not elsewhere classified: Secondary | ICD-10-CM | POA: Diagnosis not present

## 2023-06-06 DIAGNOSIS — Z940 Kidney transplant status: Secondary | ICD-10-CM

## 2023-06-06 DIAGNOSIS — R918 Other nonspecific abnormal finding of lung field: Secondary | ICD-10-CM | POA: Diagnosis not present

## 2023-06-06 DIAGNOSIS — I96 Gangrene, not elsewhere classified: Secondary | ICD-10-CM | POA: Diagnosis not present

## 2023-06-06 DIAGNOSIS — Z89519 Acquired absence of unspecified leg below knee: Secondary | ICD-10-CM

## 2023-06-06 DIAGNOSIS — E11621 Type 2 diabetes mellitus with foot ulcer: Secondary | ICD-10-CM | POA: Diagnosis present

## 2023-06-06 DIAGNOSIS — Z9989 Dependence on other enabling machines and devices: Secondary | ICD-10-CM

## 2023-06-06 DIAGNOSIS — Z9889 Other specified postprocedural states: Secondary | ICD-10-CM

## 2023-06-06 DIAGNOSIS — L97511 Non-pressure chronic ulcer of other part of right foot limited to breakdown of skin: Secondary | ICD-10-CM | POA: Diagnosis not present

## 2023-06-06 DIAGNOSIS — Z794 Long term (current) use of insulin: Secondary | ICD-10-CM | POA: Diagnosis not present

## 2023-06-06 DIAGNOSIS — G459 Transient cerebral ischemic attack, unspecified: Secondary | ICD-10-CM | POA: Diagnosis not present

## 2023-06-06 DIAGNOSIS — K219 Gastro-esophageal reflux disease without esophagitis: Secondary | ICD-10-CM | POA: Diagnosis present

## 2023-06-06 DIAGNOSIS — I639 Cerebral infarction, unspecified: Secondary | ICD-10-CM

## 2023-06-06 DIAGNOSIS — I7090 Unspecified atherosclerosis: Secondary | ICD-10-CM | POA: Diagnosis not present

## 2023-06-06 DIAGNOSIS — T402X5A Adverse effect of other opioids, initial encounter: Secondary | ICD-10-CM | POA: Diagnosis not present

## 2023-06-06 DIAGNOSIS — G546 Phantom limb syndrome with pain: Secondary | ICD-10-CM | POA: Diagnosis not present

## 2023-06-06 DIAGNOSIS — Z7982 Long term (current) use of aspirin: Secondary | ICD-10-CM

## 2023-06-06 DIAGNOSIS — Z87891 Personal history of nicotine dependence: Secondary | ICD-10-CM

## 2023-06-06 DIAGNOSIS — Z539 Procedure and treatment not carried out, unspecified reason: Secondary | ICD-10-CM | POA: Diagnosis present

## 2023-06-06 DIAGNOSIS — Z8719 Personal history of other diseases of the digestive system: Secondary | ICD-10-CM

## 2023-06-06 DIAGNOSIS — K224 Dyskinesia of esophagus: Secondary | ICD-10-CM | POA: Diagnosis not present

## 2023-06-06 DIAGNOSIS — G4733 Obstructive sleep apnea (adult) (pediatric): Secondary | ICD-10-CM | POA: Diagnosis not present

## 2023-06-06 DIAGNOSIS — Z89511 Acquired absence of right leg below knee: Secondary | ICD-10-CM | POA: Diagnosis not present

## 2023-06-06 DIAGNOSIS — I70235 Atherosclerosis of native arteries of right leg with ulceration of other part of foot: Secondary | ICD-10-CM | POA: Diagnosis not present

## 2023-06-06 DIAGNOSIS — M19071 Primary osteoarthritis, right ankle and foot: Secondary | ICD-10-CM | POA: Diagnosis not present

## 2023-06-06 DIAGNOSIS — I69398 Other sequelae of cerebral infarction: Secondary | ICD-10-CM | POA: Diagnosis not present

## 2023-06-06 DIAGNOSIS — H538 Other visual disturbances: Secondary | ICD-10-CM | POA: Diagnosis not present

## 2023-06-06 DIAGNOSIS — T451X6A Underdosing of antineoplastic and immunosuppressive drugs, initial encounter: Secondary | ICD-10-CM | POA: Diagnosis present

## 2023-06-06 DIAGNOSIS — R110 Nausea: Secondary | ICD-10-CM | POA: Diagnosis not present

## 2023-06-06 DIAGNOSIS — Y658 Other specified misadventures during surgical and medical care: Secondary | ICD-10-CM | POA: Diagnosis not present

## 2023-06-06 DIAGNOSIS — R531 Weakness: Secondary | ICD-10-CM | POA: Diagnosis not present

## 2023-06-06 DIAGNOSIS — E11649 Type 2 diabetes mellitus with hypoglycemia without coma: Secondary | ICD-10-CM | POA: Diagnosis not present

## 2023-06-06 DIAGNOSIS — M898X8 Other specified disorders of bone, other site: Secondary | ICD-10-CM | POA: Diagnosis not present

## 2023-06-06 DIAGNOSIS — I82512 Chronic embolism and thrombosis of left femoral vein: Secondary | ICD-10-CM | POA: Diagnosis not present

## 2023-06-06 DIAGNOSIS — Z91199 Patient's noncompliance with other medical treatment and regimen due to unspecified reason: Secondary | ICD-10-CM

## 2023-06-06 DIAGNOSIS — K5903 Drug induced constipation: Secondary | ICD-10-CM | POA: Diagnosis not present

## 2023-06-06 DIAGNOSIS — R600 Localized edema: Secondary | ICD-10-CM | POA: Diagnosis not present

## 2023-06-06 DIAGNOSIS — M65871 Other synovitis and tenosynovitis, right ankle and foot: Secondary | ICD-10-CM | POA: Diagnosis not present

## 2023-06-06 DIAGNOSIS — F4310 Post-traumatic stress disorder, unspecified: Secondary | ICD-10-CM | POA: Diagnosis present

## 2023-06-06 DIAGNOSIS — K2950 Unspecified chronic gastritis without bleeding: Secondary | ICD-10-CM | POA: Diagnosis present

## 2023-06-06 DIAGNOSIS — Z8249 Family history of ischemic heart disease and other diseases of the circulatory system: Secondary | ICD-10-CM

## 2023-06-06 DIAGNOSIS — Z7984 Long term (current) use of oral hypoglycemic drugs: Secondary | ICD-10-CM

## 2023-06-06 DIAGNOSIS — L089 Local infection of the skin and subcutaneous tissue, unspecified: Secondary | ICD-10-CM | POA: Diagnosis not present

## 2023-06-06 DIAGNOSIS — Z79624 Long term (current) use of inhibitors of nucleotide synthesis: Secondary | ICD-10-CM

## 2023-06-06 LAB — COMPREHENSIVE METABOLIC PANEL
ALT: 15 U/L (ref 0–44)
AST: 14 U/L — ABNORMAL LOW (ref 15–41)
Albumin: 3.6 g/dL (ref 3.5–5.0)
Alkaline Phosphatase: 99 U/L (ref 38–126)
Anion gap: 16 — ABNORMAL HIGH (ref 5–15)
BUN: 20 mg/dL (ref 8–23)
CO2: 21 mmol/L — ABNORMAL LOW (ref 22–32)
Calcium: 9.6 mg/dL (ref 8.9–10.3)
Chloride: 99 mmol/L (ref 98–111)
Creatinine, Ser: 1.1 mg/dL (ref 0.61–1.24)
GFR, Estimated: >60 mL/min (ref >60)
Glucose, Bld: 232 mg/dL — ABNORMAL HIGH (ref 70–99)
Potassium: 3.8 mmol/L (ref 3.5–5.1)
Sodium: 136 mmol/L (ref 135–145)
Total Bilirubin: 0.6 mg/dL (ref 0.3–1.2)
Total Protein: 7.2 g/dL (ref 6.5–8.1)

## 2023-06-06 LAB — CBC WITH DIFFERENTIAL/PLATELET
Abs Immature Granulocytes: 0.16 10*3/uL — ABNORMAL HIGH (ref 0.00–0.07)
Basophils Absolute: 0.1 10*3/uL (ref 0.0–0.1)
Basophils Relative: 1 %
Eosinophils Absolute: 0.1 10*3/uL (ref 0.0–0.5)
Eosinophils Relative: 1 %
HCT: 36.4 % — ABNORMAL LOW (ref 39.0–52.0)
Hemoglobin: 11.5 g/dL — ABNORMAL LOW (ref 13.0–17.0)
Immature Granulocytes: 1 %
Lymphocytes Relative: 7 %
Lymphs Abs: 1 10*3/uL (ref 0.7–4.0)
MCH: 25.8 pg — ABNORMAL LOW (ref 26.0–34.0)
MCHC: 31.6 g/dL (ref 30.0–36.0)
MCV: 81.8 fL (ref 80.0–100.0)
Monocytes Absolute: 0.9 10*3/uL (ref 0.1–1.0)
Monocytes Relative: 7 %
Neutro Abs: 11.6 10*3/uL — ABNORMAL HIGH (ref 1.7–7.7)
Neutrophils Relative %: 83 %
Platelets: 201 10*3/uL (ref 150–400)
RBC: 4.45 MIL/uL (ref 4.22–5.81)
RDW: 14.1 % (ref 11.5–15.5)
WBC: 13.9 10*3/uL — ABNORMAL HIGH (ref 4.0–10.5)
nRBC: 0 % (ref 0.0–0.2)

## 2023-06-06 LAB — TROPONIN I (HIGH SENSITIVITY)
Troponin I (High Sensitivity): 14 ng/L (ref <18)
Troponin I (High Sensitivity): 15 ng/L (ref <18)

## 2023-06-06 LAB — URINALYSIS, ROUTINE W REFLEX MICROSCOPIC
Bilirubin Urine: NEGATIVE
Glucose, UA: >=500 mg/dL — AB
Hgb urine dipstick: NEGATIVE
Ketones, ur: 20 mg/dL — AB
Leukocytes,Ua: NEGATIVE
Nitrite: NEGATIVE
Protein, ur: 100 mg/dL — AB
Specific Gravity, Urine: 1.018 (ref 1.005–1.030)
pH: 6 (ref 5.0–8.0)

## 2023-06-06 LAB — SARS CORONAVIRUS 2 BY RT PCR: SARS Coronavirus 2 by RT PCR: NEGATIVE

## 2023-06-06 LAB — MAGNESIUM: Magnesium: 2 mg/dL (ref 1.7–2.4)

## 2023-06-06 MED ORDER — ACETAMINOPHEN 650 MG RE SUPP: 650.0000 mg | Freq: Four times a day (QID) | RECTAL | Status: DC | PRN

## 2023-06-06 MED ORDER — AMLODIPINE BESYLATE 10 MG PO TABS
10.0000 mg | ORAL_TABLET | Freq: Every day | ORAL | Status: DC
  Administered 2023-06-07 – 2023-06-11 (×3): 10 mg via ORAL
  Filled 2023-06-06: qty 2
  Filled 2023-06-06 (×4): qty 1

## 2023-06-06 MED ORDER — VITAMIN C 500 MG PO TABS
250.0000 mg | ORAL_TABLET | ORAL | Status: DC
  Administered 2023-06-07 – 2023-06-18 (×4): 250 mg via ORAL
  Filled 2023-06-06 (×7): qty 1

## 2023-06-06 MED ORDER — ACETAMINOPHEN 325 MG PO TABS
650.0000 mg | ORAL_TABLET | Freq: Four times a day (QID) | ORAL | Status: DC | PRN
  Administered 2023-06-18 – 2023-06-22 (×4): 650 mg via ORAL
  Filled 2023-06-06 (×5): qty 2

## 2023-06-06 MED ORDER — MYCOPHENOLATE MOFETIL 250 MG PO CAPS: 250.0000 mg | ORAL_CAPSULE | Freq: Two times a day (BID) | ORAL | Status: DC

## 2023-06-06 MED ORDER — INSULIN GLARGINE-YFGN 100 UNIT/ML ~~LOC~~ SOLN
10.0000 [IU] | Freq: Every day | SUBCUTANEOUS | Status: DC
  Administered 2023-06-07: 10 [IU] via SUBCUTANEOUS
  Filled 2023-06-06 (×3): qty 0.1

## 2023-06-06 MED ORDER — PIPERACILLIN-TAZOBACTAM 3.375 G IVPB
3.3750 g | Freq: Three times a day (TID) | INTRAVENOUS | Status: DC
  Administered 2023-06-06 – 2023-06-07 (×2): 3.375 g via INTRAVENOUS
  Filled 2023-06-06 (×2): qty 50

## 2023-06-06 MED ORDER — POTASSIUM CHLORIDE CRYS ER 20 MEQ PO TBCR
20.0000 meq | EXTENDED_RELEASE_TABLET | Freq: Every day | ORAL | Status: DC
  Administered 2023-06-06 – 2023-06-28 (×19): 20 meq via ORAL
  Filled 2023-06-06 (×22): qty 1

## 2023-06-06 MED ORDER — LACTATED RINGERS IV SOLN: INTRAVENOUS | Status: DC

## 2023-06-06 MED ORDER — MELATONIN 3 MG PO TABS
3.0000 mg | ORAL_TABLET | Freq: Every evening | ORAL | Status: DC | PRN
  Administered 2023-06-19 – 2023-06-27 (×6): 3 mg via ORAL
  Filled 2023-06-06 (×7): qty 1

## 2023-06-06 MED ORDER — ONDANSETRON HCL 4 MG PO TABS
4.0000 mg | ORAL_TABLET | Freq: Four times a day (QID) | ORAL | Status: DC | PRN
  Administered 2023-06-08: 4 mg via ORAL
  Filled 2023-06-06 (×2): qty 1

## 2023-06-06 MED ORDER — MYCOPHENOLATE MOFETIL 250 MG PO CAPS
250.0000 mg | ORAL_CAPSULE | Freq: Two times a day (BID) | ORAL | Status: DC
  Administered 2023-06-06: 250 mg via ORAL
  Filled 2023-06-06: qty 1

## 2023-06-06 MED ORDER — FUROSEMIDE 20 MG PO TABS
20.0000 mg | ORAL_TABLET | Freq: Every day | ORAL | Status: DC
  Administered 2023-06-07 – 2023-06-22 (×14): 20 mg via ORAL
  Filled 2023-06-06 (×16): qty 1

## 2023-06-06 MED ORDER — SODIUM CHLORIDE 0.9 % IV SOLN
2.0000 g | Freq: Once | INTRAVENOUS | Status: AC
  Administered 2023-06-06: 2 g via INTRAVENOUS
  Filled 2023-06-06: qty 12.5

## 2023-06-06 MED ORDER — ONDANSETRON HCL 4 MG/2ML IJ SOLN
4.0000 mg | Freq: Four times a day (QID) | INTRAMUSCULAR | Status: DC | PRN
  Administered 2023-06-09 – 2023-06-13 (×6): 4 mg via INTRAVENOUS
  Filled 2023-06-06 (×6): qty 2

## 2023-06-06 MED ORDER — LABETALOL HCL 200 MG PO TABS
200.0000 mg | ORAL_TABLET | Freq: Once | ORAL | Status: AC
  Administered 2023-06-06: 200 mg via ORAL
  Filled 2023-06-06: qty 1

## 2023-06-06 MED ORDER — ENOXAPARIN SODIUM 40 MG/0.4ML IJ SOSY
40.0000 mg | PREFILLED_SYRINGE | INTRAMUSCULAR | Status: DC
  Administered 2023-06-07 – 2023-06-09 (×3): 40 mg via SUBCUTANEOUS
  Filled 2023-06-06 (×3): qty 0.4

## 2023-06-06 MED ORDER — ASPIRIN 81 MG PO TBEC
81.0000 mg | DELAYED_RELEASE_TABLET | Freq: Every day | ORAL | Status: DC
  Administered 2023-06-07 – 2023-06-29 (×20): 81 mg via ORAL
  Filled 2023-06-06 (×22): qty 1

## 2023-06-06 MED ORDER — TRAZODONE HCL 50 MG PO TABS
50.0000 mg | ORAL_TABLET | Freq: Every evening | ORAL | Status: DC | PRN
  Administered 2023-06-18 – 2023-06-27 (×6): 50 mg via ORAL
  Filled 2023-06-06 (×6): qty 1

## 2023-06-06 MED ORDER — LISINOPRIL 10 MG PO TABS
10.0000 mg | ORAL_TABLET | Freq: Once | ORAL | Status: AC
  Administered 2023-06-06: 10 mg via ORAL
  Filled 2023-06-06: qty 1

## 2023-06-06 MED ORDER — INSULIN ASPART 100 UNIT/ML IJ SOLN
0.0000 [IU] | Freq: Three times a day (TID) | INTRAMUSCULAR | Status: DC
  Administered 2023-06-07: 1 [IU] via SUBCUTANEOUS
  Administered 2023-06-07: 2 [IU] via SUBCUTANEOUS
  Administered 2023-06-07: 3 [IU] via SUBCUTANEOUS
  Administered 2023-06-08: 1 [IU] via SUBCUTANEOUS

## 2023-06-06 MED ORDER — PANTOPRAZOLE SODIUM 40 MG PO TBEC
40.0000 mg | DELAYED_RELEASE_TABLET | Freq: Every day | ORAL | Status: DC
  Administered 2023-06-07 – 2023-06-08 (×2): 40 mg via ORAL
  Filled 2023-06-06 (×4): qty 1

## 2023-06-06 MED ORDER — TACROLIMUS 1 MG PO CAPS
5.0000 mg | ORAL_CAPSULE | Freq: Two times a day (BID) | ORAL | Status: DC
  Filled 2023-06-06: qty 5

## 2023-06-06 MED ORDER — LABETALOL HCL 200 MG PO TABS
200.0000 mg | ORAL_TABLET | Freq: Two times a day (BID) | ORAL | Status: DC
  Administered 2023-06-06 – 2023-06-11 (×5): 200 mg via ORAL
  Filled 2023-06-06: qty 1
  Filled 2023-06-06: qty 2
  Filled 2023-06-06: qty 1
  Filled 2023-06-06: qty 2
  Filled 2023-06-06: qty 1
  Filled 2023-06-06 (×4): qty 2

## 2023-06-06 MED ORDER — FUROSEMIDE 20 MG PO TABS
20.0000 mg | ORAL_TABLET | Freq: Once | ORAL | Status: AC
  Administered 2023-06-06: 20 mg via ORAL
  Filled 2023-06-06: qty 1

## 2023-06-06 MED ORDER — VITAMIN D (ERGOCALCIFEROL) 1.25 MG (50000 UNIT) PO CAPS
50000.0000 [IU] | ORAL_CAPSULE | ORAL | Status: DC
  Administered 2023-06-16 – 2023-06-23 (×2): 50000 [IU] via ORAL
  Filled 2023-06-06 (×3): qty 1

## 2023-06-06 MED ORDER — ATORVASTATIN CALCIUM 40 MG PO TABS
40.0000 mg | ORAL_TABLET | Freq: Every day | ORAL | Status: DC
  Administered 2023-06-06 – 2023-06-11 (×3): 40 mg via ORAL
  Filled 2023-06-06 (×6): qty 1

## 2023-06-06 MED ORDER — LOSARTAN POTASSIUM 50 MG PO TABS
50.0000 mg | ORAL_TABLET | Freq: Every day | ORAL | Status: DC
  Administered 2023-06-07 – 2023-06-29 (×21): 50 mg via ORAL
  Filled 2023-06-06 (×23): qty 1

## 2023-06-06 MED ORDER — TACROLIMUS 1 MG PO CAPS
5.0000 mg | ORAL_CAPSULE | Freq: Two times a day (BID) | ORAL | Status: DC
  Administered 2023-06-06 – 2023-06-29 (×41): 5 mg via ORAL
  Filled 2023-06-06 (×47): qty 5

## 2023-06-06 NOTE — Progress Notes (Signed)
ED Pharmacy Antibiotic Sign Off An antibiotic consult was received from an ED provider for cefepime per pharmacy dosing for sepsis. A chart review was completed to assess appropriateness.   The following one time order(s) were placed:  Cefepime 2g IV x 1  Further antibiotic and/or antibiotic pharmacy consults should be ordered by the admitting provider if indicated.   Thank you for allowing pharmacy to be a part of this patient's care.   Daylene Posey, [facility]  Clinical Pharmacist 06/06/23 8:55 PM

## 2023-06-06 NOTE — ED Triage Notes (Signed)
PT BIB [facility] from home c/o generalized weakness and fatigue that started yesterday. Per pt he was sitting on the couch and started having whole body shaking but remembers the entire thing. Pt has a hx of kidney transplant. Right arm restricted d/t old access.

## 2023-06-06 NOTE — H&P (Signed)
History and Physical    Patient: [name]. RUE:[id number] DOB: 12-16-1950 DOA: 06/06/2023 DOS: the patient was seen and examined on 06/06/2023 PCP: [name], MD  Patient coming from: Home  Chief Complaint:  Chief Complaint  Patient presents with   Weakness   HPI: [name]. is a 72 y.o. male with medical history significant for renal and pancreatic transplant at [facility].  He also has diabetes mellitus, hypertension, left leg BKA, obstructive sleep apnea, and a chronic right foot wound.  The patient says he was feeling fine yesterday but this morning he woke up early and had a headache and severe shaking chills.  The rest of the house was still asleep and eventually the chills went away.  He did not feel great but was doing okay until about 1:00.  He was sitting in the chair and his family noticed that his whole body was shaking all over.  He told him do not worry because it would go away.  But there the chills were fairly violent and the family did get worried and they called EMS.  The patient denies any fevers not had any cough or shortness of breath or diarrhea.  The patient is appropriate and interactive she has some trouble with dates and timeline.  He told me that he just recovered from hernia surgery about a month ago.  In the records it looks like his third hernia surgery was last year.  He says he has had a ulcer on the base of his right foot for a month or 2 but I see that he saw Dr. Lajoyce Corners for that ulcer 8 months ago.  In the emergency department the patient did meet sepsis criteria with a white count of 13 and a heart rate of 92.  His only symptom is the rigors but he is a transplant recipient.  Blood cultures have been drawn and empiric Cefepime have been started. He is negative for COVID. His UA is unremarkable and his chest x-ray was also negative. The patient will be admitted for empiric antibiotics and awaiting his culture.   Review of Systems: As mentioned  in the history of present illness. All other systems reviewed and are negative. Past Medical History:  Diagnosis Date   Anxiety    COVID-19 virus infection 09/2020   CPAP (continuous positive airway pressure) dependence    Diabetes mellitus without complication (HCC)    GERD (gastroesophageal reflux disease)    Hypertension    Legally blind in left eye, as defined in [place]    PTSD (post-traumatic stress disorder)    PVD (peripheral vascular disease) (HCC) 01/17/2015   Renal disorder    Sleep apnea    Thyroid disease    Past Surgical History:  Procedure Laterality Date   ABDOMINAL AORTAGRAM N/A 01/21/2015   Procedure: ABDOMINAL Ronny Flurry;  Surgeon: Chuck Hint, MD;  Location: [facility] CATH LAB;  Service: Cardiovascular;  Laterality: N/A;   AMPUTATION Left 01/23/2015   Procedure: FIRST RAY AMPUTATION/LEFT FOOT;  Surgeon: Nadara Mustard, MD;  Location: [facility] OR;  Service: Orthopedics;  Laterality: Left;   AMPUTATION Left 02/20/2015   Procedure: AMPUTATION BELOW KNEE;  Surgeon: Nadara Mustard, MD;  Location: [facility] OR;  Service: Orthopedics;  Laterality: Left;   AV FISTULA PLACEMENT     BALLOON DILATION N/A 02/26/2021   Procedure: BALLOON DILATION;  Surgeon: Hilarie Fredrickson, MD;  Location: [facility] ENDOSCOPY;  Service: Endoscopy;  Laterality: N/A;   BIOPSY  02/26/2021  Procedure: BIOPSY;  Surgeon: Hilarie Fredrickson, MD;  Location: [facility] ENDOSCOPY;  Service: Endoscopy;;   COLONOSCOPY     COMBINED KIDNEY-PANCREAS TRANSPLANT Left 2009   ESOPHAGOGASTRODUODENOSCOPY (EGD) WITH PROPOFOL N/A 02/26/2021   Procedure: ESOPHAGOGASTRODUODENOSCOPY (EGD) WITH PROPOFOL;  Surgeon: Hilarie Fredrickson, MD;  Location: [facility] ENDOSCOPY;  Service: Endoscopy;  Laterality: N/A;   INCISION AND DRAINAGE ABSCESS Left 01/13/2015   Procedure: INCISION AND DRAINAGE OF LEFT FOOT FIRST RAY ABCESS;  Surgeon: Kathryne Hitch, MD;  Location: [facility] OR;  Service: Orthopedics;  Laterality: Left;   LOWER EXTREMITY ANGIOGRAM Left 01/21/2015   Procedure: LOWER  EXTREMITY ANGIOGRAM;  Surgeon: Chuck Hint, MD;  Location: [facility] CATH LAB;  Service: Cardiovascular;  Laterality: Left;   Social History:  reports that he has quit smoking. He has never used smokeless tobacco. He reports current alcohol use. He reports that he does not use drugs.  No Known Allergies  Family History  Problem Relation Age of Onset   Hypertension Other     Prior to Admission medications   Medication Sig Start Date End Date Taking? Authorizing Provider  amLODipine (NORVASC) 10 MG tablet Take 10 mg by mouth daily.    [provider]  aspirin EC 81 MG tablet Take 81 mg by mouth daily.    [provider]  atorvastatin (LIPITOR) 80 MG tablet Take 40 mg by mouth at bedtime.     [provider]  cyclobenzaprine (FLEXERIL) 5 MG tablet Take 1 tablet (5 mg total) by mouth 2 (two) times daily as needed for muscle spasms. 01/30/20   Fawze, Mina A, PA-C  doxycycline (VIBRA-TABS) 100 MG tablet Take 1 tablet (100 mg total) by mouth 2 (two) times daily. 02/16/23   Adonis Huguenin, NP  ergocalciferol (VITAMIN D2) 1.25 MG (50000 UT) capsule Take 50,000 Units by mouth every Wednesday. 05/22/15   [provider]  furosemide (LASIX) 20 MG tablet Take 1 tablet (20 mg total) by mouth daily. 07/28/17   Osvaldo Shipper, MD  insulin glargine (LANTUS SOLOSTAR) 100 UNIT/ML Solostar Pen Inject 28 Units into the skin daily. 12/04/20   Shamleffer, Konrad Dolores, MD  Insulin Pen Needle 32G X 4 MM MISC 1 Device by Does not apply route as directed. 12/04/20   Shamleffer, Konrad Dolores, MD  labetalol (NORMODYNE) 100 MG tablet Take 2 tablets (200 mg total) by mouth 2 (two) times daily. 07/28/17   Osvaldo Shipper, MD  lisinopril (PRINIVIL,ZESTRIL) 10 MG tablet Take 1 tablet (10 mg total) by mouth daily. 01/07/18   Burnadette Pop, MD  losartan (COZAAR) 50 MG tablet Take 50 mg by mouth daily. 02/19/20   [provider]  metFORMIN (GLUCOPHAGE-XR) 500 MG 24 hr tablet Take  1 tablet (500 mg total) by mouth 2 (two) times daily. 12/04/20   Shamleffer, Konrad Dolores, MD  metoCLOPramide (REGLAN) 5 MG tablet Take 1 tablet (5 mg total) by mouth 3 (three) times daily for 3 days. Patient not taking: Reported on 02/22/2021 10/20/17 01/05/26  Leatha Gilding, MD  mycophenolate (CELLCEPT) 250 MG capsule Take 1,000 mg by mouth 2 (two) times daily.    [provider]  ondansetron (ZOFRAN ODT) 8 MG disintegrating tablet Take 1 tablet (8 mg total) by mouth every 8 (eight) hours as needed for nausea. Patient not taking: No sig reported 09/28/20   Derwood Kaplan, MD  ondansetron (ZOFRAN) 4 MG tablet Take 1 tablet (4 mg total) by mouth every 8 (eight) hours as needed for nausea or vomiting. Patient  not taking: No sig reported 04/24/19   Devoria Albe, MD  pantoprazole (PROTONIX) 40 MG tablet Take 1 tablet (40 mg total) by mouth daily. 02/27/21   Marinda Elk, MD  potassium chloride 20 MEQ TBCR Take 20 mEq by mouth daily. 01/07/18   Burnadette Pop, MD  sildenafil (VIAGRA) 50 MG tablet Take 50 mg by mouth as needed. 02/29/20   [provider]  silver sulfADIAZINE (SILVADENE) 1 % cream Apply 1 application topically daily. Patient not taking: Reported on 02/22/2021 06/12/19   Eustace Moore, MD  tacrolimus (PROGRAF) 5 MG capsule Take 5 mg by mouth 2 (two) times daily.    [provider]  traZODone (DESYREL) 50 MG tablet Take 50 mg by mouth at bedtime as needed for sleep.    [provider]    Physical Exam: Vitals:   06/06/23 2000 06/06/23 2100 06/06/23 2130 06/06/23 2145  BP: (!) 169/69 (!) 167/66 (!) 148/61 (!) 166/67  Pulse: 95  83 81  Resp: 20 15 16 20   Temp:      TempSrc:      SpO2: 100%  100% 100%  Weight:      Height:       Physical Exam:  General: No acute distress, well developed, well nourished HEENT: Normocephalic, atraumatic, PERRL Cardiovascular: Normal rate and rhythm. Distal pulses faint. Pulmonary: Normal pulmonary effort,  normal breath sounds Gastrointestinal: Nondistended abdomen, soft, non-tender, normoactive bowel sounds, no organomegaly Musculoskeletal:Normal ROM, no lower ext edema, left bka Lymphadenopathy: No cervical LAD. Skin: Skin is warm and dry. Neuro: No focal deficits noted, AAOx3. PSYCH: Attentive and cooperative  Data Reviewed:  Results for orders placed or performed during the hospital encounter of 06/06/23 (from the past 24 hour(s))  SARS Coronavirus 2 by RT PCR (hospital order, performed in [facility] hospital lab) *cepheid single result test* Anterior Nasal Swab     Status: None   Collection Time: 06/06/23  3:47 PM   Specimen: Anterior Nasal Swab  Result Value Ref Range   SARS Coronavirus 2 by RT PCR NEGATIVE NEGATIVE  CBC with Differential     Status: Abnormal   Collection Time: 06/06/23  4:22 PM  Result Value Ref Range   WBC 13.9 (H) 4.0 - 10.5 K/uL   RBC 4.45 4.22 - 5.81 MIL/uL   Hemoglobin 11.5 (L) 13.0 - 17.0 g/dL   HCT 11.9 (L) 14.7 - 82.9 %   MCV 81.8 80.0 - 100.0 fL   MCH 25.8 (L) 26.0 - 34.0 pg   MCHC 31.6 30.0 - 36.0 g/dL   RDW 56.2 13.0 - 86.5 %   Platelets 201 150 - 400 K/uL   nRBC 0.0 0.0 - 0.2 %   Neutrophils Relative % 83 %   Neutro Abs 11.6 (H) 1.7 - 7.7 K/uL   Lymphocytes Relative 7 %   Lymphs Abs 1.0 0.7 - 4.0 K/uL   Monocytes Relative 7 %   Monocytes Absolute 0.9 0.1 - 1.0 K/uL   Eosinophils Relative 1 %   Eosinophils Absolute 0.1 0.0 - 0.5 K/uL   Basophils Relative 1 %   Basophils Absolute 0.1 0.0 - 0.1 K/uL   Immature Granulocytes 1 %   Abs Immature Granulocytes 0.16 (H) 0.00 - 0.07 K/uL  Comprehensive metabolic panel     Status: Abnormal   Collection Time: 06/06/23  4:22 PM  Result Value Ref Range   Sodium 136 135 - 145 mmol/L   Potassium 3.8 3.5 - 5.1 mmol/L   Chloride 99  98 - 111 mmol/L   CO2 21 (L) 22 - 32 mmol/L   Glucose, Bld 232 (H) 70 - 99 mg/dL   BUN 20 8 - 23 mg/dL   Creatinine, Ser 1.61 0.61 - 1.24 mg/dL   Calcium 9.6 8.9 - 09.6  mg/dL   Total Protein 7.2 6.5 - 8.1 g/dL   Albumin 3.6 3.5 - 5.0 g/dL   AST 14 (L) 15 - 41 U/L   ALT 15 0 - 44 U/L   Alkaline Phosphatase 99 38 - 126 U/L   Total Bilirubin 0.6 0.3 - 1.2 mg/dL   GFR, Estimated >04 >54 mL/min   Anion gap 16 (H) 5 - 15  Magnesium     Status: None   Collection Time: 06/06/23  4:22 PM  Result Value Ref Range   Magnesium 2.0 1.7 - 2.4 mg/dL  Troponin I (High Sensitivity)     Status: None   Collection Time: 06/06/23  4:22 PM  Result Value Ref Range   Troponin I (High Sensitivity) 14 <18 ng/L  Troponin I (High Sensitivity)     Status: None   Collection Time: 06/06/23  6:15 PM  Result Value Ref Range   Troponin I (High Sensitivity) 15 <18 ng/L  Urinalysis, Routine w reflex microscopic -Urine, Clean Catch     Status: Abnormal   Collection Time: 06/06/23  6:34 PM  Result Value Ref Range   Color, Urine YELLOW YELLOW   APPearance CLEAR CLEAR   Specific Gravity, Urine 1.018 1.005 - 1.030   pH 6.0 5.0 - 8.0   Glucose, UA >=500 (A) NEGATIVE mg/dL   Hgb urine dipstick NEGATIVE NEGATIVE   Bilirubin Urine NEGATIVE NEGATIVE   Ketones, ur 20 (A) NEGATIVE mg/dL   Protein, ur 098 (A) NEGATIVE mg/dL   Nitrite NEGATIVE NEGATIVE   Leukocytes,Ua NEGATIVE NEGATIVE   RBC / HPF 0-5 0 - 5 RBC/hpf   WBC, UA 0-5 0 - 5 WBC/hpf   Bacteria, UA RARE (A) NONE SEEN   Squamous Epithelial / HPF 0-5 0 - 5 /HPF   Mucus PRESENT    Granular Casts, UA PRESENT      Assessment and Plan: Sepsis inn transplant recipient -I think his most likely source is his foot and he is diabetic so Zosyn will be started. -Consult Dr. Lajoyce Corners in the a.m. -Will x-ray the foot to rule out osteomyelitis  2.  Pancreatic and renal transplant- He has fallen out of contact with the transplant center and run out of his immunosuppressive medications.   Will restart those meds (awaiting pharmacy review) and find a number for him to call.  3. DM - Restart Insulin add corrective dose insulin     Advance  Care Planning:   Code Status: Full Code the patient names his wife as his surrogate decision maker and wants to be full code.  Consults: none  Family Communication: none  Severity of Illness: The appropriate patient status for this patient is INPATIENT. Inpatient status is judged to be reasonable and necessary in order to provide the required intensity of service to ensure the patient's safety. The patient's presenting symptoms, physical exam findings, and initial radiographic and laboratory data in the context of their chronic comorbidities is felt to place them at high risk for further clinical deterioration. Furthermore, it is not anticipated that the patient will be medically stable for discharge from the hospital within 2 midnights of admission.   * I certify that at the point of admission it is my clinical  judgment that the patient will require inpatient hospital care spanning beyond 2 midnights from the point of admission due to high intensity of service, high risk for further deterioration and high frequency of surveillance required.*  Author: Buena Irish, MD 06/06/2023 10:24 PM  For on call review [email or web address].

## 2023-06-06 NOTE — ED Provider Notes (Signed)
[place] EMERGENCY DEPARTMENT AT [facility] Provider Note   CSN: [id number] Arrival date & time: 06/06/23  1516     History {Add pertinent medical, surgical, social history, OB history to HPI:1} Chief Complaint  Patient presents with   Weakness    [name]. is a 72 y.o. male with PMH as listed below who was BIB [facility] from home c/o generalized weakness and fatigue that started yesterday. Per pt he was sitting on the couch and started having tremors, chills. Comes and goes since then. He also endorses a wound on the bottom of his right foot that has been there for months, unsure if it is getting better or worse, follows with wound clinic, does not note any increased pain swelling redness or drainage. Pt has a hx of kidney transplant. Denies flu-like symptoms, nausea/vomiting, chest pain, palpitations, abdominal pain, urinary symptoms. Has had trouble getting his medications from the [place] for 3 weeks due to a change of address. Has been out of his cellcept, prograf, metformin, insulin, and all of his other meds. Only ones he has at home are amlodipine, pantoprazole, atorvastatin, aspirin.   Right arm restricted d/t old access.   Past Medical History:  Diagnosis Date   Anxiety    COVID-19 virus infection 09/2020   CPAP (continuous positive airway pressure) dependence    Diabetes mellitus without complication (HCC)    GERD (gastroesophageal reflux disease)    Hypertension    Legally blind in left eye, as defined in [place]    PTSD (post-traumatic stress disorder)    PVD (peripheral vascular disease) (HCC) 01/17/2015   Renal disorder    Sleep apnea    Thyroid disease        Home Medications Prior to Admission medications   Medication Sig Start Date End Date Taking? Authorizing Provider  amLODipine (NORVASC) 10 MG tablet Take 10 mg by mouth daily.    [provider]  aspirin EC 81 MG tablet Take 81 mg by mouth daily.    [provider]  atorvastatin  (LIPITOR) 80 MG tablet Take 40 mg by mouth at bedtime.     [provider]  cyclobenzaprine (FLEXERIL) 5 MG tablet Take 1 tablet (5 mg total) by mouth 2 (two) times daily as needed for muscle spasms. 01/30/20   Fawze, Mina A, PA-C  doxycycline (VIBRA-TABS) 100 MG tablet Take 1 tablet (100 mg total) by mouth 2 (two) times daily. 02/16/23   Adonis Huguenin, NP  ergocalciferol (VITAMIN D2) 1.25 MG (50000 UT) capsule Take 50,000 Units by mouth every Wednesday. 05/22/15   [provider]  furosemide (LASIX) 20 MG tablet Take 1 tablet (20 mg total) by mouth daily. 07/28/17   Osvaldo Shipper, MD  insulin glargine (LANTUS SOLOSTAR) 100 UNIT/ML Solostar Pen Inject 28 Units into the skin daily. 12/04/20   Shamleffer, Konrad Dolores, MD  Insulin Pen Needle 32G X 4 MM MISC 1 Device by Does not apply route as directed. 12/04/20   Shamleffer, Konrad Dolores, MD  labetalol (NORMODYNE) 100 MG tablet Take 2 tablets (200 mg total) by mouth 2 (two) times daily. 07/28/17   Osvaldo Shipper, MD  lisinopril (PRINIVIL,ZESTRIL) 10 MG tablet Take 1 tablet (10 mg total) by mouth daily. 01/07/18   Burnadette Pop, MD  losartan (COZAAR) 50 MG tablet Take 50 mg by mouth daily. 02/19/20   [provider]  metFORMIN (GLUCOPHAGE-XR) 500 MG 24 hr tablet Take 1 tablet (500 mg total) by mouth 2 (two) times daily.  12/04/20   Shamleffer, Konrad Dolores, MD  metoCLOPramide (REGLAN) 5 MG tablet Take 1 tablet (5 mg total) by mouth 3 (three) times daily for 3 days. Patient not taking: Reported on 02/22/2021 10/20/17 01/05/26  Leatha Gilding, MD  mycophenolate (CELLCEPT) 250 MG capsule Take 1,000 mg by mouth 2 (two) times daily.    [provider]  ondansetron (ZOFRAN ODT) 8 MG disintegrating tablet Take 1 tablet (8 mg total) by mouth every 8 (eight) hours as needed for nausea. Patient not taking: No sig reported 09/28/20   Derwood Kaplan, MD  ondansetron (ZOFRAN) 4 MG tablet Take 1 tablet (4 mg total) by mouth every  8 (eight) hours as needed for nausea or vomiting. Patient not taking: No sig reported 04/24/19   Devoria Albe, MD  pantoprazole (PROTONIX) 40 MG tablet Take 1 tablet (40 mg total) by mouth daily. 02/27/21   Marinda Elk, MD  potassium chloride 20 MEQ TBCR Take 20 mEq by mouth daily. 01/07/18   Burnadette Pop, MD  sildenafil (VIAGRA) 50 MG tablet Take 50 mg by mouth as needed. 02/29/20   [provider]  silver sulfADIAZINE (SILVADENE) 1 % cream Apply 1 application topically daily. Patient not taking: Reported on 02/22/2021 06/12/19   Eustace Moore, MD  tacrolimus (PROGRAF) 5 MG capsule Take 5 mg by mouth 2 (two) times daily.    [provider]  traZODone (DESYREL) 50 MG tablet Take 50 mg by mouth at bedtime as needed for sleep.    [provider]      Allergies    Patient has no known allergies.    Review of Systems   Review of Systems  Constitutional:  Positive for chills, diaphoresis and fatigue.  HENT:  Negative for congestion, rhinorrhea and sore throat.   Neurological:  Positive for tremors and weakness. Negative for seizures and numbness.   A 10 point review of systems was performed and is negative unless otherwise reported in HPI.  Physical Exam Updated Vital Signs BP (!) 172/82   Pulse 97   Temp 98.3 F (36.8 C) (Oral)   Resp 17   Ht 5\' 10"  (1.778 m)   Wt 72.6 kg   SpO2 100%   BMI 22.96 kg/m  Physical Exam General: Normal appearing male, lying in bed.  HEENT: Sclera anicteric, MMM, trachea midline.  Cardiology: RRR, no murmurs/rubs/gallops. Resp: Normal respiratory rate and effort. CTAB, no wheezes, rhonchi, crackles.  Abd: Soft, non-tender, non-distended. No rebound tenderness or guarding.  GU: Deferred. MSK: 1 x 1.5 cm wound on the ball of patient's right foot, nontender to palpation, no erythema induration fluctuance or purulent drainage. Please see photo below.  No peripheral edema or signs of trauma.  Skin: warm, dry.  Back: No  CVA tenderness Neuro: A&Ox4, CNs II-XII grossly intact. MAEs. Sensation grossly intact.  Psych: Normal mood and affect.        ED Results / Procedures / Treatments   Labs (all labs ordered are listed, but only abnormal results are displayed) Labs Reviewed  SARS CORONAVIRUS 2 BY RT PCR  CULTURE, BLOOD (ROUTINE X 2)  CULTURE, BLOOD (ROUTINE X 2)  URINE CULTURE  CBC WITH DIFFERENTIAL/PLATELET  COMPREHENSIVE METABOLIC PANEL  URINALYSIS, ROUTINE W REFLEX MICROSCOPIC  MAGNESIUM  TROPONIN I (HIGH SENSITIVITY)    EKG EKG Interpretation Date/Time:  Sunday June 06 2023 15:46:23 EDT Ventricular Rate:  96 PR Interval:  237 QRS Duration:  91 QT Interval:  344 QTC Calculation: 435 R Axis:  70  Text Interpretation: Sinus rhythm Prolonged PR interval Confirmed by Vivi Barrack [phone]) on 06/06/2023 4:19:10 PM  Radiology DG Chest Portable 1 View  Result Date: 06/06/2023 CLINICAL DATA:  Generalized weakness, fatigue. EXAM: PORTABLE CHEST 1 VIEW COMPARISON:  October 02, 2021. FINDINGS: The heart size and mediastinal contours are within normal limits. Both lungs are clear. The visualized skeletal structures are unremarkable. IMPRESSION: No active disease. Electronically Signed   By: Lupita Raider M.D.   On: 06/06/2023 16:12    Procedures Procedures  {Document cardiac monitor, telemetry assessment procedure when appropriate:1}  Medications Ordered in ED Medications - No data to display  ED Course/ Medical Decision Making/ A&P                          Medical Decision Making Amount and/or Complexity of Data Reviewed Labs: ordered. Decision-making details documented in ED Course. Radiology: ordered. Decision-making details documented in ED Course.  Risk Prescription drug management.    This patient presents to the ED for concern of ***, this involves an extensive number of treatment options, and is a complaint that carries with it a high risk of complications and morbidity.   I considered the following differential and admission for this acute, potentially life threatening condition.   MDM:    DDX for generalized weakness includes but is not limited to:  Infectious processes, severe metabolic derangements or electrolyte abnormalities, ischemia/ACS, heart failure, anemia, and intracranial/central processes but think these are unlikely given the history and physical exam.  ***With patient's chills must consider bacteremia given his transplant status.  However he has been noncompliant with his CellCept and Prograf, should still consider him immunocompromised.  With heart rate greater than 90 and white blood cell count 13.9, patient does meet SIRS criteria.  He has a mild leukocytosis which is only mildly elevated compared to his value a year ago but it does have a left shift.  Patient does not report any urinary symptoms of infection or respiratory symptoms of infection.  He does have a wound on his right bottom foot which does not appear to have any active infection and cellulitis or purulent drainage and it does not have any increased tenderness pain or redness to the patient.  I have lower concern for cellulitis at this point and this likely represents his chronic wound which has been there for months.  Drawn blood cultures and urine culture in order to assess however patient is overall very well-appearing now and he is asymptomatic.   Clinical Course as of 06/06/23 2010  Sun Jun 06, 2023  1618 DG Chest Portable 1 View Clear [HN]  1710 BP(!): 172/82 Hypertensive [HN]  1842 SARS Coronavirus 2 by RT PCR: NEGATIVE neg [HN]  1842 Troponin I (High Sensitivity): 14 neg [HN]  1842 WBC(!): 13.9 +Leukocytosis [HN]  1843 Creatinine: 1.10 Cr actually less than baseline reassuringly [HN]  1903 Troponin I (High Sensitivity): 15 Flat, negative [HN]  1936 D/w pharmacy about restarting meds at full doses and confirmed. Will give home BP meds as well for hypertension. Consulted  to [facility] for help obtaining his meds. [HN]  2008 BP decreasing, now 169/72. D/w patient. He states his BP meds were mailed to his wife's house and he can get them by tomorrow. He also stated that he should have his other medications by tomorrow. I offered to prescribe them to him if that would help him get them faster and he stated that he  should have them and the [place] pays for them so they are free to him through the [place] and declined my offer.  I discussed with the patient the absolute importance of taking his antirejection medications and he reports understanding.  I also discussed with him the deleterious effects of hypertension especially on his renal transplant and he reports understanding of the importance of his antihypertensives as well.  Patient is now asymptomatic and he has blood and urine cultures pending which he will be notified if they result positive.  Patient is overall well-appearing at this time and safe for discharge.  Patient we discharged with discharge instructions and return precautions, all questions answered to patient satisfaction. [HN]    Clinical Course User Index [HN] Loetta Rough, MD    Labs: I Ordered, and personally interpreted labs.  The pertinent results include:  those listed above  Imaging Studies ordered: I ordered imaging studies including CXR I independently visualized and interpreted imaging. I agree with the radiologist interpretation  Additional history obtained from chart review.   Cardiac Monitoring: The patient was maintained on a cardiac monitor.  I personally viewed and interpreted the cardiac monitored which showed an underlying rhythm of: NSR  Reevaluation: After the interventions noted above, I reevaluated the patient and found that they have :{resolved/improved/worsened:23923::"improved"}  Social Determinants of Health: Lives independently  Disposition:  ***  Co morbidities that complicate the patient evaluation  Past Medical History:   Diagnosis Date   Anxiety    COVID-19 virus infection 09/2020   CPAP (continuous positive airway pressure) dependence    Diabetes mellitus without complication (HCC)    GERD (gastroesophageal reflux disease)    Hypertension    Legally blind in left eye, as defined in [place]    PTSD (post-traumatic stress disorder)    PVD (peripheral vascular disease) (HCC) 01/17/2015   Renal disorder    Sleep apnea    Thyroid disease      Medicines No orders of the defined types were placed in this encounter.   I have reviewed the patients home medicines and have made adjustments as needed  Problem List / ED Course: Problem List Items Addressed This Visit   None        {Document critical care time when appropriate:1} {Document review of labs and clinical decision tools ie heart score, Chads2Vasc2 etc:1}  {Document your independent review of radiology images, and any outside records:1} {Document your discussion with family members, caretakers, and with consultants:1} {Document social determinants of health affecting pt's care:1} {Document your decision making why or why not admission, treatments were needed:1}  This note was created using dictation software, which may contain spelling or grammatical errors.

## 2023-06-07 ENCOUNTER — Inpatient Hospital Stay (HOSPITAL_COMMUNITY): Payer: Medicare HMO

## 2023-06-07 DIAGNOSIS — R600 Localized edema: Secondary | ICD-10-CM | POA: Diagnosis not present

## 2023-06-07 DIAGNOSIS — M869 Osteomyelitis, unspecified: Secondary | ICD-10-CM | POA: Diagnosis present

## 2023-06-07 DIAGNOSIS — E11621 Type 2 diabetes mellitus with foot ulcer: Secondary | ICD-10-CM | POA: Diagnosis not present

## 2023-06-07 DIAGNOSIS — L089 Local infection of the skin and subcutaneous tissue, unspecified: Secondary | ICD-10-CM

## 2023-06-07 DIAGNOSIS — M65871 Other synovitis and tenosynovitis, right ankle and foot: Secondary | ICD-10-CM | POA: Diagnosis not present

## 2023-06-07 LAB — URINE CULTURE: Culture: NO GROWTH

## 2023-06-07 LAB — CBC
HCT: 32.9 % — ABNORMAL LOW (ref 39.0–52.0)
Hemoglobin: 10.5 g/dL — ABNORMAL LOW (ref 13.0–17.0)
MCH: 25.9 pg — ABNORMAL LOW (ref 26.0–34.0)
MCHC: 31.9 g/dL (ref 30.0–36.0)
MCV: 81.2 fL (ref 80.0–100.0)
Platelets: 242 10*3/uL (ref 150–400)
RBC: 4.05 MIL/uL — ABNORMAL LOW (ref 4.22–5.81)
RDW: 14.5 % (ref 11.5–15.5)
WBC: 11.3 10*3/uL — ABNORMAL HIGH (ref 4.0–10.5)
nRBC: 0 % (ref 0.0–0.2)

## 2023-06-07 LAB — BASIC METABOLIC PANEL
Anion gap: 11 (ref 5–15)
BUN: 21 mg/dL (ref 8–23)
CO2: 21 mmol/L — ABNORMAL LOW (ref 22–32)
Calcium: 8.7 mg/dL — ABNORMAL LOW (ref 8.9–10.3)
Chloride: 102 mmol/L (ref 98–111)
Creatinine, Ser: 1.2 mg/dL (ref 0.61–1.24)
GFR, Estimated: >60 mL/min (ref >60)
Glucose, Bld: 278 mg/dL — ABNORMAL HIGH (ref 70–99)
Potassium: 4.8 mmol/L (ref 3.5–5.1)
Sodium: 134 mmol/L — ABNORMAL LOW (ref 135–145)

## 2023-06-07 LAB — CBG MONITORING, ED
Glucose-Capillary: 276 mg/dL — ABNORMAL HIGH (ref 70–99)
Glucose-Capillary: 199 mg/dL — ABNORMAL HIGH (ref 70–99)

## 2023-06-07 LAB — HEMOGLOBIN A1C
Hgb A1c MFr Bld: 11.1 % — ABNORMAL HIGH (ref 4.8–5.6)
Mean Plasma Glucose: 271.87 mg/dL

## 2023-06-07 LAB — MAGNESIUM: Magnesium: 1.9 mg/dL (ref 1.7–2.4)

## 2023-06-07 LAB — TSH: TSH: 1.563 u[IU]/mL (ref 0.350–4.500)

## 2023-06-07 LAB — GLUCOSE, CAPILLARY
Glucose-Capillary: 206 mg/dL — ABNORMAL HIGH (ref 70–99)
Glucose-Capillary: 180 mg/dL — ABNORMAL HIGH (ref 70–99)

## 2023-06-07 LAB — HIV ANTIBODY (ROUTINE TESTING W REFLEX): HIV Screen 4th Generation wRfx: NONREACTIVE

## 2023-06-07 LAB — PROCALCITONIN: Procalcitonin: <0.1 ng/mL

## 2023-06-07 MED ORDER — VANCOMYCIN HCL 1500 MG/300ML IV SOLN
1500.0000 mg | Freq: Once | INTRAVENOUS | Status: AC
  Administered 2023-06-07: 1500 mg via INTRAVENOUS
  Filled 2023-06-07: qty 300

## 2023-06-07 MED ORDER — GUAIFENESIN 100 MG/5ML PO LIQD: 5.0000 mL | ORAL | Status: DC | PRN

## 2023-06-07 MED ORDER — HYDRALAZINE HCL 20 MG/ML IJ SOLN
10.0000 mg | INTRAMUSCULAR | Status: DC | PRN
  Administered 2023-06-10 – 2023-06-11 (×4): 10 mg via INTRAVENOUS
  Filled 2023-06-07 (×5): qty 1

## 2023-06-07 MED ORDER — SODIUM CHLORIDE 0.9 % IV SOLN
2.0000 g | Freq: Two times a day (BID) | INTRAVENOUS | Status: DC
  Administered 2023-06-07 (×2): 2 g via INTRAVENOUS
  Filled 2023-06-07 (×2): qty 12.5

## 2023-06-07 MED ORDER — METOPROLOL TARTRATE 5 MG/5ML IV SOLN
5.0000 mg | INTRAVENOUS | Status: DC | PRN
  Administered 2023-06-11 – 2023-06-23 (×3): 5 mg via INTRAVENOUS
  Filled 2023-06-07 (×3): qty 5

## 2023-06-07 MED ORDER — MYCOPHENOLATE MOFETIL 250 MG PO CAPS
1000.0000 mg | ORAL_CAPSULE | Freq: Two times a day (BID) | ORAL | Status: DC
  Administered 2023-06-07 – 2023-06-29 (×38): 1000 mg via ORAL
  Filled 2023-06-07 (×46): qty 4

## 2023-06-07 MED ORDER — IPRATROPIUM-ALBUTEROL 0.5-2.5 (3) MG/3ML IN SOLN: 3.0000 mL | RESPIRATORY_TRACT | Status: DC | PRN

## 2023-06-07 MED ORDER — SENNOSIDES-DOCUSATE SODIUM 8.6-50 MG PO TABS: 1.0000 | ORAL_TABLET | Freq: Every evening | ORAL | Status: DC | PRN

## 2023-06-07 MED ORDER — VANCOMYCIN HCL 1750 MG/350ML IV SOLN
1750.0000 mg | Freq: Once | INTRAVENOUS | Status: DC
  Filled 2023-06-07: qty 350

## 2023-06-07 NOTE — Inpatient Diabetes Management (Signed)
Inpatient Diabetes Program Recommendations  AACE/ADA: New Consensus Statement on Inpatient Glycemic Control (2015)  Target Ranges:  Prepandial:   less than 140 mg/dL      Peak postprandial:   less than 180 mg/dL (1-2 hours)      Critically ill patients:  140 - 180 mg/dL   Lab Results  Component Value Date   GLUCAP 199 (H) 06/07/2023   HGBA1C 11.1 (H) 06/07/2023    Review of Glycemic Control  Latest Reference Range & Units 06/07/23 07:52 06/07/23 12:11  Glucose-Capillary 70 - 99 mg/dL 161 (H) 096 (H)    Diabetes history:  DM 2 Outpatient Diabetes medications: lantus 28 units Daily, Metformin 500 mg bid Current orders for Inpatient glycemic control:  Semglee 10 units Daily Novolog 0-6 units tid  A1c 11.1% on 9/9  Spoke with pt at bedside regarding A1c of 11.1% and glucose control at home. Pt reports compliance with medications at home and checks his glucose twice a day. Pt does not eat regularly. Pt reports infection with his foot and also reports a lot of anxiety at home which he takes medication for. Pt reports a recent visit with the v/a ad they told him to change his lifestyle for now and then they may possibly adjust his medications. Discussed glucose and A1c goals.    Thanks,  Christena Deem RN, MSN, BC-ADM Inpatient Diabetes Coordinator Team Pager [phone] (8a-5p)

## 2023-06-07 NOTE — Progress Notes (Signed)
Pharmacy Antibiotic Note  [name]. is a 72 y.o. male admitted on 06/06/2023 with osteomyelitis .  Pharmacy has been consulted for cefepime and vancomycin dosing.  Plan: Cefepime 2G IV q12 hours Vancomycin 1500mg  IV x1 then 1750mg  daily (eAUC 498)  Height: 5\' 10"  (177.8 cm) Weight: 72.6 kg (160 lb) IBW/kg (Calculated) : 73  Temp (24hrs), Avg:98.5 F (36.9 C), Min:98.1 F (36.7 C), Max:98.9 F (37.2 C)  Recent Labs  Lab 06/06/23 1622 06/07/23 0527 06/07/23 0645  WBC 13.9* 11.3*  --   CREATININE 1.10  --  1.20    Estimated Creatinine Clearance: 58.8 mL/min (by C-G formula based on SCr of 1.2 mg/dL).    No Known Allergies   Microbiology results: 9/8 BCx: pending 9/8 UCx: pending  Thank you for allowing pharmacy to be a part of this patient's care.  Toniann Fail Blaze Nylund 06/07/2023 12:16 PM

## 2023-06-07 NOTE — ED Notes (Signed)
Patient transported to MRI 

## 2023-06-07 NOTE — Progress Notes (Signed)
PROGRESS NOTE    [name].  AVW:[id number] DOB: 1950-12-09 DOA: 06/06/2023 PCP: [name], MD   Brief Narrative:  72 year old with history of renal/pancreatic transplant at [facility], DM 2, HTN, left leg BKA, OSA, chronic right foot wound woke up on the day of admission with chills.  In the ED he was started on broad-spectrum antibiotics and admitted due to concerns of possible foot infection, Dr. Lajoyce Corners consulted.   Assessment & Plan:  Active Problems:   S/P BKA (below knee amputation) unilateral (HCC)   OSA on CPAP   History of simultaneous kidney and pancreas transplant (HCC)   Acute on chronic right foot ulcer with fevers and chills IV antibiotics-vancomycin and cefepime.  Will order MRI of the right foot.  Consult Dr. Lajoyce Corners   History of pancreatic/renal transplant, 2008 -On CellCept and tacrolimus.  Follows mostly at the [place] and [place] [facility] with Dr. Arrie Aran   Diabetes mellitus type 2 History of gastroparesis  Essential hypertension -Norvasc, losartan, Lasix.  IV as needed  Hyperlipidemia - Statin  Obstructive sleep apnea -Follow-up outpatient   DVT prophylaxis: enoxaparin (LOVENOX) injection 40 mg Start: 06/07/23 1000 Code Status: Full code Family Communication:   Status is: Inpatient Remains inpatient appropriate because: Concerns of osteomyelitis, on IV antibiotics    Subjective: Seen and bedside, no complaints at this time Tells me he has been a kidney transplant patient for about 15 years and has not followed up with his transplant team at [facility] for over 10 years.  His renal issues are managed at the [place] and with Dr.: Edwyna Shell from [place] [facility]   Examination:  General exam: Appears calm and comfortable  Respiratory system: Clear to auscultation. Respiratory effort normal. Cardiovascular system: S1 & S2 heard, RRR. No JVD, murmurs, rubs, gallops or clicks. No pedal edema. Gastrointestinal system: Abdomen is nondistended, soft and  nontender. No organomegaly or masses felt. Normal bowel sounds heard. Central nervous system: Alert and oriented. No focal neurological deficits. Extremities: Left BKA.  Right plantar foot ulcer Skin: Right ffoot ulceration Psychiatry: Judgement and insight appear normal. Mood & affect appropriate.      Diet Orders (From admission, onward)     Start     Ordered   06/06/23 2220  Diet Carb Modified Fluid consistency: Thin; Room service appropriate? Yes  Diet effective now       Question Answer Comment  Diet-HS Snack? Nothing   Calorie Level Medium 1600-2000   Fluid consistency: Thin   Room service appropriate? Yes      06/06/23 2219            Objective: Vitals:   06/07/23 0500 06/07/23 0620 06/07/23 0624 06/07/23 0730  BP: (!) 135/54   (!) 158/64  Pulse: 75   73  Resp: 18   19  Temp:  98.1 F (36.7 C)    TempSrc:  Oral    SpO2: 100%  99% 100%  Weight:      Height:       No intake or output data in the 24 hours ending 06/07/23 0902 Filed Weights   06/06/23 1522  Weight: 72.6 kg    Scheduled Meds:  amLODipine  10 mg Oral Daily   ascorbic acid  250 mg Oral Q M,W,F   aspirin EC  81 mg Oral Daily   atorvastatin  40 mg Oral QHS   enoxaparin (LOVENOX) injection  40 mg Subcutaneous Q24H   furosemide  20 mg Oral Daily   insulin aspart  0-6 Units Subcutaneous TID WC   insulin glargine-yfgn  10 Units Subcutaneous Daily   labetalol  200 mg Oral BID   losartan  50 mg Oral Daily   mycophenolate  1,000 mg Oral BID   pantoprazole  40 mg Oral Daily   potassium chloride SA  20 mEq Oral Daily   tacrolimus  5 mg Oral BID   [START ON 06/09/2023] Vitamin D (Ergocalciferol)  50,000 Units Oral Q Wed   Continuous Infusions:  lactated ringers 100 mL/hr at 06/06/23 2359   piperacillin-tazobactam (ZOSYN)  IV 3.375 g (06/07/23 0536)    Nutritional status     Body mass index is 22.96 kg/m.  Data Reviewed:   CBC: Recent Labs  Lab 06/06/23 1622 06/07/23 0527  WBC 13.9*  11.3*  NEUTROABS 11.6*  --   HGB 11.5* 10.5*  HCT 36.4* 32.9*  MCV 81.8 81.2  PLT 201 242   Basic Metabolic Panel: Recent Labs  Lab 06/06/23 1622 06/07/23 0645  NA 136 134*  K 3.8 4.8  CL 99 102  CO2 21* 21*  GLUCOSE 232* 278*  BUN 20 21  CREATININE 1.10 1.20  CALCIUM 9.6 8.7*  MG 2.0 1.9   GFR: Estimated Creatinine Clearance: 58.8 mL/min (by C-G formula based on SCr of 1.2 mg/dL). Liver Function Tests: Recent Labs  Lab 06/06/23 1622  AST 14*  ALT 15  ALKPHOS 99  BILITOT 0.6  PROT 7.2  ALBUMIN 3.6   No results for input(s): "LIPASE", "AMYLASE" in the last 168 hours. No results for input(s): "AMMONIA" in the last 168 hours. Coagulation Profile: No results for input(s): "INR", "PROTIME" in the last 168 hours. Cardiac Enzymes: No results for input(s): "CKTOTAL", "CKMB", "CKMBINDEX", "TROPONINI" in the last 168 hours. BNP (last 3 results) No results for input(s): "PROBNP" in the last 8760 hours. HbA1C: Recent Labs    06/07/23 0527  HGBA1C 11.1*   CBG: Recent Labs  Lab 06/07/23 0752  GLUCAP 276*   Lipid Profile: No results for input(s): "CHOL", "HDL", "LDLCALC", "TRIG", "CHOLHDL", "LDLDIRECT" in the last 72 hours. Thyroid Function Tests: Recent Labs    06/07/23 0527  TSH 1.563   Anemia Panel: No results for input(s): "VITAMINB12", "FOLATE", "FERRITIN", "TIBC", "IRON", "RETICCTPCT" in the last 72 hours. Sepsis Labs: No results for input(s): "PROCALCITON", "LATICACIDVEN" in the last 168 hours.  Recent Results (from the past 240 hour(s))  SARS Coronavirus 2 by RT PCR (hospital order, performed in [facility] hospital lab) *cepheid single result test* Anterior Nasal Swab     Status: None   Collection Time: 06/06/23  3:47 PM   Specimen: Anterior Nasal Swab  Result Value Ref Range Status   SARS Coronavirus 2 by RT PCR NEGATIVE NEGATIVE Final    Comment: Performed at [facility], [address], [place], [place] [address]  Blood culture (routine x  2)     Status: None (Preliminary result)   Collection Time: 06/06/23  6:15 PM   Specimen: BLOOD  Result Value Ref Range Status   Specimen Description BLOOD BLOOD LEFT ARM  Final   Special Requests   Final    BOTTLES DRAWN AEROBIC AND ANAEROBIC Blood Culture adequate volume   Culture   Final    NO GROWTH < 12 HOURS Performed at [facility], [address] [place], [place] [address]    Report Status PENDING  Incomplete  Blood culture (routine x 2)     Status: None (Preliminary result)   Collection Time:  06/06/23  6:15 PM   Specimen: BLOOD  Result Value Ref Range Status   Specimen Description BLOOD BLOOD LEFT HAND  Final   Special Requests   Final    BOTTLES DRAWN AEROBIC AND ANAEROBIC Blood Culture adequate volume   Culture   Final    NO GROWTH < 12 HOURS Performed at [facility], [address], [place], [place] [address]    Report Status PENDING  Incomplete         Radiology Studies: DG Foot 2 Views Right  Result Date: 06/06/2023 CLINICAL DATA:  Osteomyelitis EXAM: RIGHT FOOT - 2 VIEW COMPARISON:  Right foot x-ray 10/15/2022 FINDINGS: There is no evidence of fracture or dislocation. There are mild degenerative changes of the first metatarsophalangeal joint. Peripheral vascular calcifications are present. No cortical erosions identified. Soft tissues are otherwise within normal limits. IMPRESSION: 1. Mild degenerative changes of the first metatarsophalangeal joint. 2. Peripheral vascular calcifications. Electronically Signed   By: Darliss Cheney M.D.   On: 06/06/2023 23:14   DG Chest Portable 1 View  Result Date: 06/06/2023 CLINICAL DATA:  Generalized weakness, fatigue. EXAM: PORTABLE CHEST 1 VIEW COMPARISON:  October 02, 2021. FINDINGS: The heart size and mediastinal contours are within normal limits. Both lungs are clear. The visualized skeletal structures are unremarkable. IMPRESSION: No active disease. Electronically Signed   By: Lupita Raider M.D.   On:  06/06/2023 16:12           LOS: 1 day   Time spent= 35 mins    Miguel Rota, MD [facility]  If 7PM-7AM, please contact night-coverage  06/07/2023, 9:02 AM

## 2023-06-07 NOTE — Plan of Care (Signed)
  Problem: Coping: Goal: Ability to adjust to condition or change in health will improve Outcome: Progressing   Problem: Fluid Volume: Goal: Ability to maintain a balanced intake and output will improve Outcome: Progressing   Problem: Metabolic: Goal: Ability to maintain appropriate glucose levels will improve Outcome: Progressing   Problem: Nutritional: Goal: Maintenance of adequate nutrition will improve Outcome: Progressing   

## 2023-06-07 NOTE — ED Notes (Signed)
ED TO INPATIENT HANDOFF REPORT  ED Nurse Name and Phone #:   S Name/Age/Gender [name]. 72 y.o. male Room/Bed: 042C/042C  Code Status   Code Status: Full Code  Home/SNF/Other Home Patient oriented to: self, place, time, and situation Is this baseline? Yes   Triage Complete: Triage complete  Chief Complaint Sepsis (HCC) [A41.9] Foot osteomyelitis (HCC) [M86.9]  Triage Note PT BIB [facility] from home c/o generalized weakness and fatigue that started yesterday. Per pt he was sitting on the couch and started having whole body shaking but remembers the entire thing. Pt has a hx of kidney transplant. Right arm restricted d/t old access.    Allergies No Known Allergies  Level of Care/Admitting Diagnosis ED Disposition     ED Disposition  Admit   Condition  --   Comment  Hospital Area: [facility] [[id number]]  Level of Care: Med-Surg [16]  May admit patient to [facility] or [facility] if equivalent level of care is available:: No  Covid Evaluation: Asymptomatic - no recent exposure (last 10 days) testing not required  Diagnosis: Foot osteomyelitis [facility]) [[id number]]  Admitting Physician: Miguel Rota [[id number]]  Attending Physician: Miguel Rota [phone]  Certification:: I certify this patient will need inpatient services for at least 2 midnights          B Medical/Surgery History Past Medical History:  Diagnosis Date   Anxiety    COVID-19 virus infection 09/2020   CPAP (continuous positive airway pressure) dependence    Diabetes mellitus without complication (HCC)    GERD (gastroesophageal reflux disease)    Hypertension    Legally blind in left eye, as defined in [place]    PTSD (post-traumatic stress disorder)    PVD (peripheral vascular disease) (HCC) 01/17/2015   Renal disorder    Sleep apnea    Thyroid disease    Past Surgical History:  Procedure Laterality Date   ABDOMINAL AORTAGRAM N/A 01/21/2015   Procedure: ABDOMINAL Ronny Flurry;   Surgeon: Chuck Hint, MD;  Location: [facility] CATH LAB;  Service: Cardiovascular;  Laterality: N/A;   AMPUTATION Left 01/23/2015   Procedure: FIRST RAY AMPUTATION/LEFT FOOT;  Surgeon: Nadara Mustard, MD;  Location: [facility] OR;  Service: Orthopedics;  Laterality: Left;   AMPUTATION Left 02/20/2015   Procedure: AMPUTATION BELOW KNEE;  Surgeon: Nadara Mustard, MD;  Location: [facility] OR;  Service: Orthopedics;  Laterality: Left;   AV FISTULA PLACEMENT     BALLOON DILATION N/A 02/26/2021   Procedure: BALLOON DILATION;  Surgeon: Hilarie Fredrickson, MD;  Location: [facility] ENDOSCOPY;  Service: Endoscopy;  Laterality: N/A;   BIOPSY  02/26/2021   Procedure: BIOPSY;  Surgeon: Hilarie Fredrickson, MD;  Location[facility] ENDOSCOPY;  Service: Endoscopy;;   COLONOSCOPY     COMBINED KIDNEY-PANCREAS TRANSPLANT Left 2009   ESOPHAGOGASTRODUODENOSCOPY (EGD) WITH PROPOFOL N/A 02/26/2021   Procedure: ESOPHAGOGASTRODUODENOSCOPY (EGD) WITH PROPOFOL;  Surgeon: Hilarie Fredrickson, MD;  Location: [facility] ENDOSCOPY;  Service: Endoscopy;  Laterality: N/A;   INCISION AND DRAINAGE ABSCESS Left 01/13/2015   Procedure: INCISION AND DRAINAGE OF LEFT FOOT FIRST RAY ABCESS;  Surgeon: Kathryne Hitch, MD;  Location: [facility] OR;  Service: Orthopedics;  Laterality: Left;   LOWER EXTREMITY ANGIOGRAM Left 01/21/2015   Procedure: LOWER EXTREMITY ANGIOGRAM;  Surgeon: Chuck Hint, MD;  Location: [facility] CATH LAB;  Service: Cardiovascular;  Laterality: Left;     A IV Location/Drains/Wounds Patient Lines/Drains/Airways Status     Active Line/Drains/Airways     Name  Placement date Placement time Site Days   Peripheral IV 06/06/23 22 G 1" Left Hand 06/06/23  2157  Hand  1   Fistula / Graft Right Upper arm Arteriovenous fistula --  --  Upper arm  --            Intake/Output Last 24 hours No intake or output data in the 24 hours ending 06/07/23 1328  Labs/Imaging Results for orders placed or performed during the hospital encounter of 06/06/23 (from the past 48  hour(s))  SARS Coronavirus 2 by RT PCR (hospital order, performed in [facility] hospital lab) *cepheid single result test* Anterior Nasal Swab     Status: None   Collection Time: 06/06/23  3:47 PM   Specimen: Anterior Nasal Swab  Result Value Ref Range   SARS Coronavirus 2 by RT PCR NEGATIVE NEGATIVE    Comment: Performed at [facility] Lab, [address] [place], [place] [address]  CBC with Differential     Status: Abnormal   Collection Time: 06/06/23  4:22 PM  Result Value Ref Range   WBC 13.9 (H) 4.0 - 10.5 K/uL   RBC 4.45 4.22 - 5.81 MIL/uL   Hemoglobin 11.5 (L) 13.0 - 17.0 g/dL   HCT 13.0 (L) 86.5 - 78.4 %   MCV 81.8 80.0 - 100.0 fL   MCH 25.8 (L) 26.0 - 34.0 pg   MCHC 31.6 30.0 - 36.0 g/dL   RDW 69.6 29.5 - 28.4 %   Platelets 201 150 - 400 K/uL   nRBC 0.0 0.0 - 0.2 %   Neutrophils Relative % 83 %   Neutro Abs 11.6 (H) 1.7 - 7.7 K/uL   Lymphocytes Relative 7 %   Lymphs Abs 1.0 0.7 - 4.0 K/uL   Monocytes Relative 7 %   Monocytes Absolute 0.9 0.1 - 1.0 K/uL   Eosinophils Relative 1 %   Eosinophils Absolute 0.1 0.0 - 0.5 K/uL   Basophils Relative 1 %   Basophils Absolute 0.1 0.0 - 0.1 K/uL   Immature Granulocytes 1 %   Abs Immature Granulocytes 0.16 (H) 0.00 - 0.07 K/uL    Comment: Performed at [facility], [address], [place], [place] [address]  Comprehensive metabolic panel     Status: Abnormal   Collection Time: 06/06/23  4:22 PM  Result Value Ref Range   Sodium 136 135 - 145 mmol/L   Potassium 3.8 3.5 - 5.1 mmol/L   Chloride 99 98 - 111 mmol/L   CO2 21 (L) 22 - 32 mmol/L   Glucose, Bld 232 (H) 70 - 99 mg/dL    Comment: Glucose reference range applies only to samples taken after fasting for at least 8 hours.   BUN 20 8 - 23 mg/dL   Creatinine, Ser 0.10 0.61 - 1.24 mg/dL   Calcium 9.6 8.9 - 27.2 mg/dL   Total Protein 7.2 6.5 - 8.1 g/dL   Albumin 3.6 3.5 - 5.0 g/dL   AST 14 (L) 15 - 41 U/L   ALT 15 0 - 44 U/L   Alkaline Phosphatase 99 38 - 126 U/L    Total Bilirubin 0.6 0.3 - 1.2 mg/dL   GFR, Estimated >53 >66 mL/min    Comment: (NOTE) Calculated using the CKD-EPI Creatinine Equation (2021)    Anion gap 16 (H) 5 - 15    Comment: Performed at [facility], [address] [place], [place] [address]  Magnesium     Status: None   Collection  Time: 06/06/23  4:22 PM  Result Value Ref Range   Magnesium 2.0 1.7 - 2.4 mg/dL    Comment: Performed at [facility], [address] [place], [place] [address]  Troponin I (High Sensitivity)     Status: None   Collection Time: 06/06/23  4:22 PM  Result Value Ref Range   Troponin I (High Sensitivity) 14 <18 ng/L    Comment: (NOTE) Elevated high sensitivity troponin I (hsTnI) values and significant  changes across serial measurements may suggest ACS but many other  chronic and acute conditions are known to elevate hsTnI results.  Refer to the "Links" section for chest pain algorithms and additional  guidance. Performed at [facility], [address], [place], [place] [address]   Blood culture (routine x 2)     Status: None (Preliminary result)   Collection Time: 06/06/23  6:15 PM   Specimen: BLOOD  Result Value Ref Range   Specimen Description BLOOD BLOOD LEFT ARM    Special Requests      BOTTLES DRAWN AEROBIC AND ANAEROBIC Blood Culture adequate volume   Culture      NO GROWTH < 12 HOURS Performed at [facility], [address], [place], [place] [address]    Report Status PENDING   Blood culture (routine x 2)     Status: None (Preliminary result)   Collection Time: 06/06/23  6:15 PM   Specimen: BLOOD  Result Value Ref Range   Specimen Description BLOOD BLOOD LEFT HAND    Special Requests      BOTTLES DRAWN AEROBIC AND ANAEROBIC Blood Culture adequate volume   Culture      NO GROWTH < 12 HOURS Performed at [facility], [address], [place], [place] [address]    Report Status PENDING   Troponin I (High Sensitivity)     Status: None    Collection Time: 06/06/23  6:15 PM  Result Value Ref Range   Troponin I (High Sensitivity) 15 <18 ng/L    Comment: (NOTE) Elevated high sensitivity troponin I (hsTnI) values and significant  changes across serial measurements may suggest ACS but many other  chronic and acute conditions are known to elevate hsTnI results.  Refer to the "Links" section for chest pain algorithms and additional  guidance. Performed at [facility], [address] [place], [place] [address]   Urinalysis, Routine w reflex microscopic -Urine, Clean Catch     Status: Abnormal   Collection Time: 06/06/23  6:34 PM  Result Value Ref Range   Color, Urine YELLOW YELLOW   APPearance CLEAR CLEAR   Specific Gravity, Urine 1.018 1.005 - 1.030   pH 6.0 5.0 - 8.0   Glucose, UA >=500 (A) NEGATIVE mg/dL   Hgb urine dipstick NEGATIVE NEGATIVE   Bilirubin Urine NEGATIVE NEGATIVE   Ketones, ur 20 (A) NEGATIVE mg/dL   Protein, ur 295 (A) NEGATIVE mg/dL   Nitrite NEGATIVE NEGATIVE   Leukocytes,Ua NEGATIVE NEGATIVE   RBC / HPF 0-5 0 - 5 RBC/hpf   WBC, UA 0-5 0 - 5 WBC/hpf   Bacteria, UA RARE (A) NONE SEEN   Squamous Epithelial / HPF 0-5 0 - 5 /HPF   Mucus PRESENT    Granular Casts, UA PRESENT     Comment: Performed at [facility], [address], [place], [place] [address]  Urine Culture     Status: None   Collection Time: 06/06/23  6:34 PM  Specimen: Urine, Clean Catch  Result Value Ref Range   Specimen Description URINE, CLEAN CATCH    Special Requests NONE    Culture      NO GROWTH Performed at [facility], [address] [place], [place] [address]    Report Status 06/07/2023 FINAL   TSH     Status: None   Collection Time: 06/07/23  5:27 AM  Result Value Ref Range   TSH 1.563 0.350 - 4.500 uIU/mL    Comment: Performed by a 3rd Generation assay with a functional sensitivity of <=0.01 uIU/mL. Performed at [facility], [address] [place], [place] [address]   CBC     Status:  Abnormal   Collection Time: 06/07/23  5:27 AM  Result Value Ref Range   WBC 11.3 (H) 4.0 - 10.5 K/uL   RBC 4.05 (L) 4.22 - 5.81 MIL/uL   Hemoglobin 10.5 (L) 13.0 - 17.0 g/dL   HCT 82.9 (L) 56.2 - 13.0 %   MCV 81.2 80.0 - 100.0 fL   MCH 25.9 (L) 26.0 - 34.0 pg   MCHC 31.9 30.0 - 36.0 g/dL   RDW 86.5 78.4 - 69.6 %   Platelets 242 150 - 400 K/uL   nRBC 0.0 0.0 - 0.2 %    Comment: Performed at [facility], [address] [place], [place] [address]  Hemoglobin A1c     Status: Abnormal   Collection Time: 06/07/23  5:27 AM  Result Value Ref Range   Hgb A1c MFr Bld 11.1 (H) 4.8 - 5.6 %    Comment: (NOTE) Pre diabetes:          5.7%-6.4%  Diabetes:              >6.4%  Glycemic control for   <7.0% adults with diabetes    Mean Plasma Glucose 271.87 mg/dL    Comment: Performed at [facility], [address] [place], [place] [address]  Basic metabolic panel     Status: Abnormal   Collection Time: 06/07/23  6:45 AM  Result Value Ref Range   Sodium 134 (L) 135 - 145 mmol/L   Potassium 4.8 3.5 - 5.1 mmol/L    Comment: HEMOLYSIS AT THIS LEVEL MAY AFFECT RESULT   Chloride 102 98 - 111 mmol/L   CO2 21 (L) 22 - 32 mmol/L   Glucose, Bld 278 (H) 70 - 99 mg/dL    Comment: Glucose reference range applies only to samples taken after fasting for at least 8 hours.   BUN 21 8 - 23 mg/dL   Creatinine, Ser 4.01 0.61 - 1.24 mg/dL   Calcium 8.7 (L) 8.9 - 10.3 mg/dL   GFR, Estimated >02 >72 mL/min    Comment: (NOTE) Calculated using the CKD-EPI Creatinine Equation (2021)    Anion gap 11 5 - 15    Comment: Performed at [facility], [address], [place], [place] [address]  Magnesium     Status: None   Collection Time: 06/07/23  6:45 AM  Result Value Ref Range   Magnesium 1.9 1.7 - 2.4 mg/dL    Comment: Performed at [facility] Lab, [address] [place], [place] [address]  CBG monitoring, ED     Status: Abnormal   Collection Time: 06/07/23  7:52 AM  Result Value Ref  Range   Glucose-Capillary 276 (H) 70 - 99 mg/dL    Comment: Glucose reference range applies only to samples taken after  fasting for at least 8 hours.   Comment 1 Notify RN    Comment 2 Document in Chart   CBG monitoring, ED     Status: Abnormal   Collection Time: 06/07/23 12:11 PM  Result Value Ref Range   Glucose-Capillary 199 (H) 70 - 99 mg/dL    Comment: Glucose reference range applies only to samples taken after fasting for at least 8 hours.   DG Foot 2 Views Right  Result Date: 06/06/2023 CLINICAL DATA:  Osteomyelitis EXAM: RIGHT FOOT - 2 VIEW COMPARISON:  Right foot x-ray 10/15/2022 FINDINGS: There is no evidence of fracture or dislocation. There are mild degenerative changes of the first metatarsophalangeal joint. Peripheral vascular calcifications are present. No cortical erosions identified. Soft tissues are otherwise within normal limits. IMPRESSION: 1. Mild degenerative changes of the first metatarsophalangeal joint. 2. Peripheral vascular calcifications. Electronically Signed   By: Darliss Cheney M.D.   On: 06/06/2023 23:14   DG Chest Portable 1 View  Result Date: 06/06/2023 CLINICAL DATA:  Generalized weakness, fatigue. EXAM: PORTABLE CHEST 1 VIEW COMPARISON:  October 02, 2021. FINDINGS: The heart size and mediastinal contours are within normal limits. Both lungs are clear. The visualized skeletal structures are unremarkable. IMPRESSION: No active disease. Electronically Signed   By: Lupita Raider M.D.   On: 06/06/2023 16:12    Pending Labs Unresulted Labs (From admission, onward)     Start     Ordered   06/08/23 0500  Basic metabolic panel  Daily,   R      06/07/23 0906   06/08/23 0500  CBC  Daily,   R      06/07/23 0906   06/08/23 0500  Magnesium  Daily,   R      06/07/23 1151   06/07/23 1400  Procalcitonin  Once,   STAT       References:    Procalcitonin Lower Respiratory Tract Infection AND Sepsis Procalcitonin Algorithm   06/07/23 1400   06/07/23 1312  Procalcitonin   Once,   R       References:    Procalcitonin Lower Respiratory Tract Infection AND Sepsis Procalcitonin Algorithm   06/07/23 1311   06/07/23 0500  HIV Antibody (routine testing w rflx)  (HIV Antibody (Routine testing w reflex) panel)  Tomorrow morning,   R        06/06/23 2219            Vitals/Pain Today's Vitals   06/07/23 0958 06/07/23 1030 06/07/23 1210 06/07/23 1213  BP:  (!) 161/69 (!) 153/62   Pulse:  76 74   Resp:  10 18   Temp:    98.3 F (36.8 C)  TempSrc:    Oral  SpO2:  100% 100%   Weight:      Height:      PainSc: 0-No pain       Isolation Precautions No active isolations  Medications Medications  tacrolimus (PROGRAF) capsule 5 mg (5 mg Oral Given 06/07/23 1006)  enoxaparin (LOVENOX) injection 40 mg (40 mg Subcutaneous Given 06/07/23 0958)  acetaminophen (TYLENOL) tablet 650 mg (has no administration in time range)    Or  acetaminophen (TYLENOL) suppository 650 mg (has no administration in time range)  ondansetron (ZOFRAN) tablet 4 mg (has no administration in time range)    Or  ondansetron (ZOFRAN) injection 4 mg (has no administration in time range)  melatonin tablet 3 mg (has no administration in time range)  insulin glargine-yfgn (SEMGLEE) injection 10 Units (  10 Units Subcutaneous Given 06/07/23 0959)  insulin aspart (novoLOG) injection 0-6 Units (1 Units Subcutaneous Given 06/07/23 1222)  lactated ringers infusion ( Intravenous New Bag/Given 06/07/23 1010)  aspirin EC tablet 81 mg (81 mg Oral Given 06/07/23 1002)  amLODipine (NORVASC) tablet 10 mg (10 mg Oral Given 06/07/23 1002)  atorvastatin (LIPITOR) tablet 40 mg (40 mg Oral Given 06/06/23 2333)  furosemide (LASIX) tablet 20 mg (20 mg Oral Given 06/07/23 1002)  labetalol (NORMODYNE) tablet 200 mg (200 mg Oral Given 06/07/23 1002)  losartan (COZAAR) tablet 50 mg (50 mg Oral Given 06/07/23 1001)  traZODone (DESYREL) tablet 50 mg (has no administration in time range)  pantoprazole (PROTONIX) EC tablet 40 mg (40 mg Oral  Given 06/07/23 1002)  ascorbic acid (VITAMIN C) tablet 250 mg (250 mg Oral Given 06/07/23 1002)  Vitamin D (Ergocalciferol) (DRISDOL) 1.25 MG (50000 UNIT) capsule 50,000 Units (has no administration in time range)  potassium chloride SA (KLOR-CON M) CR tablet 20 mEq (20 mEq Oral Given 06/07/23 1001)  mycophenolate (CELLCEPT) capsule 1,000 mg (1,000 mg Oral Given 06/07/23 1006)  hydrALAZINE (APRESOLINE) injection 10 mg (has no administration in time range)  metoprolol tartrate (LOPRESSOR) injection 5 mg (has no administration in time range)  ipratropium-albuterol (DUONEB) 0.5-2.5 (3) MG/3ML nebulizer solution 3 mL (has no administration in time range)  senna-docusate (Senokot-S) tablet 1 tablet (has no administration in time range)  guaiFENesin (ROBITUSSIN) 100 MG/5ML liquid 5 mL (has no administration in time range)  vancomycin (VANCOREADY) IVPB 1500 mg/300 mL (1,500 mg Intravenous New Bag/Given 06/07/23 1255)  ceFEPIme (MAXIPIME) 2 g in sodium chloride 0.9 % 100 mL IVPB (0 g Intravenous Stopped 06/07/23 1310)  furosemide (LASIX) tablet 20 mg (20 mg Oral Given 06/06/23 1923)  lisinopril (ZESTRIL) tablet 10 mg (10 mg Oral Given 06/06/23 1922)  labetalol (NORMODYNE) tablet 200 mg (200 mg Oral Given 06/06/23 2022)  ceFEPIme (MAXIPIME) 2 g in sodium chloride 0.9 % 100 mL IVPB (0 g Intravenous Stopped 06/06/23 2302)    Mobility walks     Focused Assessments    R Recommendations: See Admitting Provider Note  Report given to:   Additional Notes:

## 2023-06-08 ENCOUNTER — Inpatient Hospital Stay (HOSPITAL_COMMUNITY): Payer: Medicare HMO

## 2023-06-08 ENCOUNTER — Encounter (HOSPITAL_COMMUNITY): Payer: Medicare HMO

## 2023-06-08 ENCOUNTER — Encounter (HOSPITAL_COMMUNITY): Payer: Self-pay | Admitting: Certified Registered"

## 2023-06-08 DIAGNOSIS — I739 Peripheral vascular disease, unspecified: Secondary | ICD-10-CM | POA: Diagnosis not present

## 2023-06-08 DIAGNOSIS — M869 Osteomyelitis, unspecified: Secondary | ICD-10-CM | POA: Diagnosis not present

## 2023-06-08 LAB — CBC
HCT: 32.7 % — ABNORMAL LOW (ref 39.0–52.0)
Hemoglobin: 10.4 g/dL — ABNORMAL LOW (ref 13.0–17.0)
MCH: 25.6 pg — ABNORMAL LOW (ref 26.0–34.0)
MCHC: 31.8 g/dL (ref 30.0–36.0)
MCV: 80.5 fL (ref 80.0–100.0)
Platelets: 218 10*3/uL (ref 150–400)
RBC: 4.06 MIL/uL — ABNORMAL LOW (ref 4.22–5.81)
RDW: 14 % (ref 11.5–15.5)
WBC: 12.9 10*3/uL — ABNORMAL HIGH (ref 4.0–10.5)
nRBC: 0 % (ref 0.0–0.2)

## 2023-06-08 LAB — GLUCOSE, CAPILLARY
Glucose-Capillary: 177 mg/dL — ABNORMAL HIGH (ref 70–99)
Glucose-Capillary: 309 mg/dL — ABNORMAL HIGH (ref 70–99)
Glucose-Capillary: 97 mg/dL (ref 70–99)
Glucose-Capillary: 96 mg/dL (ref 70–99)
Glucose-Capillary: 179 mg/dL — ABNORMAL HIGH (ref 70–99)

## 2023-06-08 LAB — BASIC METABOLIC PANEL
Anion gap: 9 (ref 5–15)
BUN: 15 mg/dL (ref 8–23)
CO2: 22 mmol/L (ref 22–32)
Calcium: 9 mg/dL (ref 8.9–10.3)
Chloride: 107 mmol/L (ref 98–111)
Creatinine, Ser: 1.11 mg/dL (ref 0.61–1.24)
GFR, Estimated: >60 mL/min (ref >60)
Glucose, Bld: 192 mg/dL — ABNORMAL HIGH (ref 70–99)
Potassium: 3.9 mmol/L (ref 3.5–5.1)
Sodium: 138 mmol/L (ref 135–145)

## 2023-06-08 LAB — MAGNESIUM: Magnesium: 1.8 mg/dL (ref 1.7–2.4)

## 2023-06-08 MED ORDER — MEDIHONEY WOUND/BURN DRESSING EX PSTE
1.0000 | PASTE | Freq: Every day | CUTANEOUS | Status: DC
  Administered 2023-06-08 – 2023-06-17 (×10): 1 via TOPICAL
  Filled 2023-06-08 (×2): qty 44

## 2023-06-08 MED ORDER — LACTATED RINGERS IV SOLN: INTRAVENOUS | Status: AC

## 2023-06-08 MED ORDER — INSULIN ASPART 100 UNIT/ML IJ SOLN
0.0000 [IU] | Freq: Three times a day (TID) | INTRAMUSCULAR | Status: DC
  Administered 2023-06-08: 11 [IU] via SUBCUTANEOUS
  Administered 2023-06-09: 2 [IU] via SUBCUTANEOUS
  Administered 2023-06-09: 5 [IU] via SUBCUTANEOUS
  Administered 2023-06-09: 2 [IU] via SUBCUTANEOUS
  Administered 2023-06-10: 3 [IU] via SUBCUTANEOUS

## 2023-06-08 MED ORDER — SODIUM CHLORIDE 0.9 % IV SOLN
2.0000 g | Freq: Three times a day (TID) | INTRAVENOUS | Status: DC
  Administered 2023-06-08 – 2023-06-15 (×21): 2 g via INTRAVENOUS
  Filled 2023-06-08 (×23): qty 12.5

## 2023-06-08 MED ORDER — INSULIN GLARGINE-YFGN 100 UNIT/ML ~~LOC~~ SOLN
16.0000 [IU] | Freq: Every day | SUBCUTANEOUS | Status: DC
  Administered 2023-06-08 – 2023-06-10 (×3): 16 [IU] via SUBCUTANEOUS
  Filled 2023-06-08 (×5): qty 0.16

## 2023-06-08 MED ORDER — VANCOMYCIN HCL 1500 MG/300ML IV SOLN
1500.0000 mg | INTRAVENOUS | Status: DC
  Administered 2023-06-08 – 2023-06-11 (×4): 1500 mg via INTRAVENOUS
  Filled 2023-06-08 (×6): qty 300

## 2023-06-08 NOTE — Consult Note (Signed)
WOC Nurse Consult Note: Reason for Consult: Consult requested for right foot wound.  Performed remotely after review of progress notes and photo in the EMR.  Right foot MRI indicates, "There appears to be mild cortical erosion within the proximal plantar aspect of the proximal phalanx of the great toe where there is moderate marrow edema, suspicious for acute osteomyelitis." This complex medical condition is beyond the scope of practice for WOC nurses; please consult ortho team for further recommendations. Secure chat message sent to primary team to inform them.  Wound type: Right plantar foot with chronic full thickness wound, 100% yellow and moist; 1.5X1X.5cm, according to bedside nurse's wound flow sheet. Dressing procedure/placement/frequency: Topical treatment orders provided for bedside nurses to perform as follows to assist with removal of nonviable tissue until further recommendations provided form the ortho team: Apply Medihoney to right foot wound Q day, then cover with foam dressing.  Change foam dressing Q 3 days or PRN soiling. Please re-consult if further assistance is needed.  Thank-you,  Cammie Mcgee MSN, RN, CWOCN, [place], CNS [phone]

## 2023-06-08 NOTE — Progress Notes (Signed)
PROGRESS NOTE    [name].  NAT:[id number] DOB: Jan 03, 1951 DOA: 06/06/2023 PCP: [name] Quitter, MD     Brief Narrative:  72 year old with history of renal/pancreatic transplant at [facility], DM 2, HTN, left leg BKA, OSA, chronic right foot wound woke up on the day of admission with chills.  In the ED he was started on broad-spectrum antibiotics and admitted due to concerns of possible foot infection, Dr. Lajoyce [name] consulted.  MRI of the foot was consistent with osteomyelitis.  ABIs were ordered.     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  MRI foot is consistent with osteomyelitis.  ABI has been ordered.  Dr. Lajoyce [name] following.  Depending on circulation, may need vascular involvement   History of pancreatic/renal transplant, 2008 -On CellCept and tacrolimus.  Follows mostly at the [facility] with Dr. Arrie [name]   Diabetes mellitus type 2 History of gastroparesis.  Adjusting insulin as necessary   Essential hypertension -Norvasc, losartan, Lasix.  IV as needed   Hyperlipidemia - Statin   Obstructive sleep apnea -Follow-up outpatient     DVT prophylaxis: enoxaparin (LOVENOX) injection 40 mg Start: 06/07/23 1000 Code Status: Full code Family Communication:   Status is: Inpatient Remains inpatient appropriate because: Concerns of osteomyelitis, on IV antibiotics             Subjective: No complaints doing well.   Examination:  General exam: Appears calm and comfortable  Respiratory system: Clear to auscultation. Respiratory effort normal. Cardiovascular system: S1 & S2 heard, RRR. No JVD, murmurs, rubs, gallops or clicks. No pedal edema. Gastrointestinal system: Abdomen is nondistended, soft and nontender. No organomegaly or masses  felt. Normal bowel sounds heard. Central nervous system: Alert and oriented. No focal neurological deficits. Extremities: Symmetric 5 x 5 power. Skin: Right foot dressing in place Psychiatry: Judgement and insight appear normal. Mood & affect appropriate.       Diet Orders (From admission, onward)     Start     Ordered   06/06/23 2220  Diet Carb Modified Fluid consistency: Thin; Room service appropriate? Yes  Diet effective now       Question Answer Comment  Diet-HS Snack? Nothing   Calorie Level Medium 1600-2000   Fluid consistency: Thin   Room service appropriate? Yes      06/06/23 2219            Objective: Vitals:   06/07/23 1536 06/08/23 0521 06/08/23 0732 06/08/23 0751  BP: (!) 147/62 (!) 141/52 (!) 154/61 (!) 155/56  Pulse: 78 85 88 86  Resp: 16 20 17    Temp: 98.1 F (36.7 C) 98.5 F (36.9 C) 98.5 F (36.9 C)   TempSrc: Oral Oral Oral   SpO2: 100%     Weight:      Height:        Intake/Output Summary (Last 24 hours) at 06/08/2023 1311 Last data filed at 06/08/2023 0400 Gross per 24 hour  Intake 1343.32 ml  Output 825 ml  Net 518.32 ml   Filed Weights   06/06/23 1522  Weight: 72.6 kg    Scheduled Meds:  amLODipine  10 mg Oral Daily   ascorbic acid  250 mg Oral Q M,W,F   aspirin EC  81 mg Oral Daily   atorvastatin  40 mg Oral QHS   enoxaparin (LOVENOX) injection  40 mg Subcutaneous Q24H   furosemide  20 mg Oral Daily   insulin aspart  0-15 Units Subcutaneous TID WC   insulin glargine-yfgn  16 Units Subcutaneous Daily   labetalol  200 mg Oral BID   leptospermum manuka honey  1 Application Topical Daily   losartan  50 mg Oral Daily   mycophenolate  1,000 mg Oral BID   pantoprazole  40 mg Oral Daily   potassium chloride SA  20 mEq Oral Daily   tacrolimus  5 mg Oral BID   [START ON 06/09/2023] Vitamin D (Ergocalciferol)  50,000 Units Oral Q Wed   Continuous Infusions:  ceFEPime (MAXIPIME) IV 2 g (06/08/23 1110)   lactated ringers 75 mL/hr at  06/08/23 1124   vancomycin      Nutritional status     Body mass index is 22.96 kg/m.  Data Reviewed:   CBC: Recent Labs  Lab 06/06/23 1622 06/07/23 0527 06/08/23 0219  WBC 13.9* 11.3* 12.9*  NEUTROABS 11.6*  --   --   HGB 11.5* 10.5* 10.4*  HCT 36.4* 32.9* 32.7*  MCV 81.8 81.2 80.5  PLT 201 242 218   Basic Metabolic Panel: Recent Labs  Lab 06/06/23 1622 06/07/23 0645 06/08/23 0219  NA 136 134* 138  K 3.8 4.8 3.9  CL 99 102 107  CO2 21* 21* 22  GLUCOSE 232* 278* 192*  BUN 20 21 15   CREATININE 1.10 1.20 1.11  CALCIUM 9.6 8.7* 9.0  MG 2.0 1.9 1.8   GFR: Estimated Creatinine Clearance: 63.6 mL/min (by C-G formula based on SCr of 1.11 mg/dL). Liver Function Tests: Recent Labs  Lab 06/06/23 1622  AST 14*  ALT 15  ALKPHOS 99  BILITOT 0.6  PROT 7.2  ALBUMIN 3.6   No results for input(s): "LIPASE", "AMYLASE" in the last 168 hours. No results for input(s): "AMMONIA" in the last 168 hours. Coagulation Profile: No results for input(s): "INR", "PROTIME" in the last 168 hours. Cardiac Enzymes: No results for input(s): "CKTOTAL", "CKMB", "CKMBINDEX", "TROPONINI" in the last 168 hours. BNP (last 3 results) No results for input(s): "PROBNP" in the last 8760 hours. HbA1C: Recent Labs    06/07/23 0527  HGBA1C 11.1*   CBG: Recent Labs  Lab 06/07/23 1211 06/07/23 1642 06/07/23 2239 06/08/23 0800 06/08/23 1218  GLUCAP 199* 206* 180* 177* 309*   Lipid Profile: No results for input(s): "CHOL", "HDL", "LDLCALC", "TRIG", "CHOLHDL", "LDLDIRECT" in the last 72 hours. Thyroid Function Tests: Recent Labs    06/07/23 0527  TSH 1.563   Anemia Panel: No results for input(s): "VITAMINB12", "FOLATE", "FERRITIN", "TIBC", "IRON", "RETICCTPCT" in the last 72 hours. Sepsis Labs: Recent Labs  Lab 06/07/23 1712  PROCALCITON <0.10    Recent Results (from the past 240 hour(s))  SARS Coronavirus 2 by RT PCR (hospital order, performed in [facility] hospital lab)  *cepheid single result test* Anterior Nasal Swab     Status: None   Collection Time: 06/06/23  3:47 PM   Specimen: Anterior Nasal Swab  Result Value Ref Range Status   SARS Coronavirus 2 by RT PCR NEGATIVE NEGATIVE Final    Comment: Performed at [facility], [address], [place], [place] [address]  Blood culture (routine x 2)     Status: None (Preliminary result)   Collection Time: 06/06/23  6:15 PM   Specimen: BLOOD  Result Value Ref Range Status   Specimen Description BLOOD BLOOD LEFT ARM  Final   Special Requests   Final    BOTTLES DRAWN AEROBIC AND ANAEROBIC Blood Culture adequate volume   Culture   Final    NO GROWTH 2 DAYS Performed at [facility], [address], [place], [place] [address]    Report Status PENDING  Incomplete  Blood culture (routine x 2)     Status: None (Preliminary result)   Collection Time: 06/06/23  6:15 PM   Specimen: BLOOD  Result Value Ref Range Status   Specimen Description BLOOD BLOOD LEFT HAND  Final   Special Requests   Final    BOTTLES DRAWN AEROBIC AND ANAEROBIC Blood Culture adequate volume   Culture   Final    NO GROWTH 2 DAYS Performed at [facility], [address], [place], [place] [address]    Report Status PENDING  Incomplete  Urine Culture     Status: None   Collection Time: 06/06/23  6:34 PM   Specimen: Urine, Clean Catch  Result Value Ref Range Status   Specimen Description URINE, CLEAN CATCH  Final   Special Requests NONE  Final   Culture   Final    NO GROWTH Performed at [facility], [address], [place], [place] [address]    Report Status 06/07/2023 FINAL  Final         Radiology Studies: MR FOOT RIGHT WO CONTRAST  Result Date: 06/07/2023 CLINICAL DATA:  Soft tissue infection suspected. 71 year old male with medical history significant for renal and pancreatic transplant at [facility]. History of diabetes and left below-the-knee amputation. Chronic right foot wound. Chills tear.  Ulcer and base of right foot. Elevated white blood cell count. EXAM: MRI OF THE RIGHT FOREFOOT WITHOUT CONTRAST TECHNIQUE: Multiplanar, multisequence MR imaging of the right forefoot was performed. No intravenous contrast was administered. COMPARISON:  Right foot radiographs 06/06/2023, 10/15/2022, 05/05/2020 FINDINGS: Bones/Joint/Cartilage The plantar great toe metatarsophalangeal joint ulcer described below is contiguous with fluid contacting the plantar aspect of the great toe metatarsal head and the adjacent proximal phalanx. There is moderate marrow edema throughout the majority of the proximal 75% of the proximal phalanx of the great toe (sagittal series 7, image 23 and coronal series 5 images 10 through 15). There is also high-grade marrow edema within the lateral and moderate marrow edema within the medial great toe metatarsophalangeal joint plantar sesamoids. Severe great toe metatarsal head-plantar sesamoid joint space narrowing with a 10 mm metatarsal head subcortical cyst. Severe great toe metatarsophalangeal joint space narrowing. Probable mild erosion within the plantar base of the great toe proximal phalanx (axial series 3 images tendon 11). Mild erosion of the plantar aspect of the lateral greater than medial great toe metatarsophalangeal plantar sesamoids. Mild to moderate first and mild second through fifth tarsometatarsal cartilage thinning. The distal aspect of the distal phalanx of the great toe is incompletely imaged. Within this limitation, no definitive marrow edema is seen within the distal phalanx of the great toe, noting sagittal STIR images have better homogeneity of decreased fat signal compared to the T2 fat saturation sequence. Ligaments The Lisfranc ligament complex is intact. The sesamoid phalangeal ligaments at the great toe plantar plate are intact. Muscles and Tendons Fluid within the plantar great toe soft tissue ulcer described below is contiguous with mild fluid within the  flexor hallucis longus tendon sheath at the level of the great toe metatarsophalangeal joint (axial series 4 image 12), and there is a higher degree of fluid seen within  the flexor hallucis longus tendon sheath at the level of the midfoot at the most proximal image (axial series 4, image 35/35). There is mild to moderate intermediate T2 signal and thickening tendinosis of the flexor hallucis longus tendon at the level of the mid to distal first metatarsal shaft. There is a small, focal linear split tear within the flexor hallucis longus tendon at the level of the great toe metatarsophalangeal joint (axial images tendon 11). There is diffuse high-grade edema throughout all the visualized plantar musculature. Mild-to-moderate fatty infiltration and atrophy of the interosseous and dorsal musculature. Soft tissues There is a plantar ulcer extending through the entire plantar subcutaneous fat at the level of the great toe metatarsophalangeal joint, just plantar to the flexor hallucis longus tendon and measuring up to 8 mm in transverse dimension and 12 mm along the longitudinal dimension of the foot (axial series 4, image 12 and sagittal series 7, image 21). IMPRESSION: 1. There is a plantar ulcer extending through the entire plantar subcutaneous fat at the level of the great toe metatarsophalangeal joint, just plantar to the flexor hallucis longus tendon. This contacts the great toe metatarsophalangeal medial and lateral plantar sesamoids, which demonstrate diffuse marrow edema and mild plantar cortical erosion suspicious for acute osteomyelitis. Mild edema within the adjacent plantar aspect of the great toe metatarsal head may be degenerative, however cannot exclude mild acute osteomyelitis. There also appears to be mild cortical erosion within the proximal plantar aspect of the proximal phalanx of the great toe where there is moderate marrow edema, suspicious for acute osteomyelitis. 2. Mild flexor hallucis longus  tenosynovitis and tendinosis as above. 3. Diffuse high-grade edema throughout all the visualized plantar musculature. This is nonspecific and could be related to diabetic myopathy, denervation, and/or infectious myositis. Electronically Signed   By: Neita Garnet M.D.   On: 06/07/2023 17:16   DG Foot 2 Views Right  Result Date: 06/06/2023 CLINICAL DATA:  Osteomyelitis EXAM: RIGHT FOOT - 2 VIEW COMPARISON:  Right foot x-ray 10/15/2022 FINDINGS: There is no evidence of fracture or dislocation. There are mild degenerative changes of the first metatarsophalangeal joint. Peripheral vascular calcifications are present. No cortical erosions identified. Soft tissues are otherwise within normal limits. IMPRESSION: 1. Mild degenerative changes of the first metatarsophalangeal joint. 2. Peripheral vascular calcifications. Electronically Signed   By: Darliss Cheney M.D.   On: 06/06/2023 23:14   DG Chest Portable 1 View  Result Date: 06/06/2023 CLINICAL DATA:  Generalized weakness, fatigue. EXAM: PORTABLE CHEST 1 VIEW COMPARISON:  October 02, 2021. FINDINGS: The heart size and mediastinal contours are within normal limits. Both lungs are clear. The visualized skeletal structures are unremarkable. IMPRESSION: No active disease. Electronically Signed   By: Lupita Raider M.D.   On: 06/06/2023 16:12           LOS: 2 days   Time spent= 35 mins    Miguel Rota, MD [facility]  If 7PM-7AM, please contact night-coverage  06/08/2023, 1:11 PM

## 2023-06-08 NOTE — H&P (View-Only) (Signed)
ORTHOPAEDIC CONSULTATION  REQUESTING PHYSICIAN: Miguel Rota, MD  Chief Complaint: Ulceration first metatarsal head right foot.  HPI: [name]. is a 72 y.o. male who presents with ulceration first metatarsal head right foot.  Patient has a history of diabetes and peripheral vascular disease.  Patient is status post left foot first ray amputation, and subsequent transtibial amputation in 2016.  Endovascular evaluation at this time showed single-vessel runoff through the posterior tibial artery with occlusion of the anterior tibial and peroneal.  Past Medical History:  Diagnosis Date   Anxiety    COVID-19 virus infection 09/2020   CPAP (continuous positive airway pressure) dependence    Diabetes mellitus without complication (HCC)    GERD (gastroesophageal reflux disease)    Hypertension    Legally blind in left eye, as defined in [place]    PTSD (post-traumatic stress disorder)    PVD (peripheral vascular disease) (HCC) 01/17/2015   Renal disorder    Sleep apnea    Thyroid disease    Past Surgical History:  Procedure Laterality Date   ABDOMINAL AORTAGRAM N/A 01/21/2015   Procedure: ABDOMINAL Ronny Flurry;  Surgeon: Chuck Hint, MD;  Location: [facility] CATH LAB;  Service: Cardiovascular;  Laterality: N/A;   AMPUTATION Left 01/23/2015   Procedure: FIRST RAY AMPUTATION/LEFT FOOT;  Surgeon: Nadara Mustard, MD;  Location: [facility] OR;  Service: Orthopedics;  Laterality: Left;   AMPUTATION Left 02/20/2015   Procedure: AMPUTATION BELOW KNEE;  Surgeon: Nadara Mustard, MD;  Location: [facility] OR;  Service: Orthopedics;  Laterality: Left;   AV FISTULA PLACEMENT     BALLOON DILATION N/A 02/26/2021   Procedure: BALLOON DILATION;  Surgeon: Hilarie Fredrickson, MD;  Location: [facility] ENDOSCOPY;  Service: Endoscopy;  Laterality: N/A;   BIOPSY  02/26/2021   Procedure: BIOPSY;  Surgeon: Hilarie Fredrickson, MD;  Location: [facility] ENDOSCOPY;  Service: Endoscopy;;   COLONOSCOPY     COMBINED KIDNEY-PANCREAS TRANSPLANT Left 2009    ESOPHAGOGASTRODUODENOSCOPY (EGD) WITH PROPOFOL N/A 02/26/2021   Procedure: ESOPHAGOGASTRODUODENOSCOPY (EGD) WITH PROPOFOL;  Surgeon: Hilarie Fredrickson, MD;  Location: [facility] ENDOSCOPY;  Service: Endoscopy;  Laterality: N/A;   INCISION AND DRAINAGE ABSCESS Left 01/13/2015   Procedure: INCISION AND DRAINAGE OF LEFT FOOT FIRST RAY ABCESS;  Surgeon: Kathryne Hitch, MD;  Location: [facility] OR;  Service: Orthopedics;  Laterality: Left;   LOWER EXTREMITY ANGIOGRAM Left 01/21/2015   Procedure: LOWER EXTREMITY ANGIOGRAM;  Surgeon: Chuck Hint, MD;  Location: [facility] CATH LAB;  Service: Cardiovascular;  Laterality: Left;   Social History   Socioeconomic History   Marital status: Married    Spouse name: Not on file   Number of children: Not on file   Years of education: Not on file   Highest education level: Not on file  Occupational History   Not on file  Tobacco Use   Smoking status: Former   Smokeless tobacco: Never   Tobacco comments:    quit i [phone]  Substance and Sexual Activity   Alcohol use: Yes    Comment: 1 beer rarely    Drug use: No   Sexual activity: Not on file  Other Topics Concern   Not on file  Social History Narrative   Not on file   Social Determinants of Health   Financial Resource Strain: Not on file  Food Insecurity: No Food Insecurity (06/07/2023)   Hunger Vital Sign    Worried About Running Out of Food in the Last Year: Never true  Ran Out of Food in the Last Year: Never true  Transportation Needs: Unmet Transportation Needs (06/07/2023)   PRAPARE - Administrator, Civil Service (Medical): Yes    Lack of Transportation (Non-Medical): No  Physical Activity: Not on file  Stress: Not on file  Social Connections: Unknown (02/06/2022)   [facility]   Social Network    Social Network: Not on file   Family History  Problem Relation Age of Onset   Hypertension Other    - negative except otherwise stated in the family  history section No Known Allergies Prior to Admission medications   Medication Sig Start Date End Date Taking? Authorizing Provider  amLODipine (NORVASC) 10 MG tablet Take 10 mg by mouth daily.   Yes [provider]  ascorbic acid (VITAMIN C) 250 MG tablet Take 250 mg by mouth once a week. Take 1 tablet by mouth on Monday, Wednesday, and Friday for iron absorption. 06/04/23  Yes [provider]  aspirin EC 81 MG tablet Take 81 mg by mouth daily.   Yes [provider]  atorvastatin (LIPITOR) 80 MG tablet Take 40 mg by mouth at bedtime.    Yes [provider]  cyclobenzaprine (FLEXERIL) 5 MG tablet Take 1 tablet (5 mg total) by mouth 2 (two) times daily as needed for muscle spasms. 01/30/20  Yes Fawze, Mina A, PA-C  doxycycline (VIBRA-TABS) 100 MG tablet Take 1 tablet (100 mg total) by mouth 2 (two) times daily. 02/16/23  Yes Barnie Del R, NP  ergocalciferol (VITAMIN D2) 1.25 MG (50000 UT) capsule Take 50,000 Units by mouth every Wednesday. 05/22/15  Yes [provider]  ferrous sulfate 325 (65 FE) MG tablet Take 325 mg by mouth once a week. Take 1 tablet by mouth on Monday, Wednesday, and Friday 06/04/23  Yes [provider]  furosemide (LASIX) 20 MG tablet Take 1 tablet (20 mg total) by mouth daily. 07/28/17  Yes Osvaldo Shipper, MD  insulin glargine (LANTUS SOLOSTAR) 100 UNIT/ML Solostar Pen Inject 28 Units into the skin daily. 12/04/20  Yes Shamleffer, Konrad Dolores, MD  labetalol (NORMODYNE) 100 MG tablet Take 2 tablets (200 mg total) by mouth 2 (two) times daily. 07/28/17  Yes Osvaldo Shipper, MD  lisinopril (PRINIVIL,ZESTRIL) 10 MG tablet Take 1 tablet (10 mg total) by mouth daily. 01/07/18  Yes Burnadette Pop, MD  losartan (COZAAR) 50 MG tablet Take 50 mg by mouth daily. 02/19/20  Yes [provider]  metFORMIN (GLUCOPHAGE-XR) 500 MG 24 hr tablet Take 1 tablet (500 mg total) by mouth 2 (two) times daily. 12/04/20  Yes Shamleffer, Konrad Dolores, MD  mycophenolate (CELLCEPT) 250 MG capsule Take 1,000 mg by mouth 2 (two) times daily.   Yes [provider]  pantoprazole (PROTONIX) 40 MG tablet Take 1 tablet (40 mg total) by mouth daily. 02/27/21  Yes Marinda Elk, MD  potassium chloride 20 MEQ TBCR Take 20 mEq by mouth daily. 01/07/18  Yes Burnadette Pop, MD  tacrolimus (PROGRAF) 5 MG capsule Take 5 mg by mouth 2 (two) times daily.   Yes [provider]  traZODone (DESYREL) 50 MG tablet Take 50 mg by mouth at bedtime as needed for sleep.   Yes [provider]  sildenafil (VIAGRA) 50 MG tablet Take 50 mg by mouth as needed. 02/29/20   [provider]   MR FOOT RIGHT WO CONTRAST  Result Date: 06/07/2023 CLINICAL DATA:  Soft tissue infection suspected. 72 year old male with  medical history significant for renal and pancreatic transplant at [facility]. History of diabetes and left below-the-knee amputation. Chronic right foot wound. Chills tear. Ulcer and base of right foot. Elevated white blood cell count. EXAM: MRI OF THE RIGHT FOREFOOT WITHOUT CONTRAST TECHNIQUE: Multiplanar, multisequence MR imaging of the right forefoot was performed. No intravenous contrast was administered. COMPARISON:  Right foot radiographs 06/06/2023, 10/15/2022, 05/05/2020 FINDINGS: Bones/Joint/Cartilage The plantar great toe metatarsophalangeal joint ulcer described below is contiguous with fluid contacting the plantar aspect of the great toe metatarsal head and the adjacent proximal phalanx. There is moderate marrow edema throughout the majority of the proximal 75% of the proximal phalanx of the great toe (sagittal series 7, image 23 and coronal series 5 images 10 through 15). There is also high-grade marrow edema within the lateral and moderate marrow edema within the medial great toe metatarsophalangeal joint plantar sesamoids. Severe great toe metatarsal head-plantar sesamoid joint space narrowing with a 10 mm  metatarsal head subcortical cyst. Severe great toe metatarsophalangeal joint space narrowing. Probable mild erosion within the plantar base of the great toe proximal phalanx (axial series 3 images tendon 11). Mild erosion of the plantar aspect of the lateral greater than medial great toe metatarsophalangeal plantar sesamoids. Mild to moderate first and mild second through fifth tarsometatarsal cartilage thinning. The distal aspect of the distal phalanx of the great toe is incompletely imaged. Within this limitation, no definitive marrow edema is seen within the distal phalanx of the great toe, noting sagittal STIR images have better homogeneity of decreased fat signal compared to the T2 fat saturation sequence. Ligaments The Lisfranc ligament complex is intact. The sesamoid phalangeal ligaments at the great toe plantar plate are intact. Muscles and Tendons Fluid within the plantar great toe soft tissue ulcer described below is contiguous with mild fluid within the flexor hallucis longus tendon sheath at the level of the great toe metatarsophalangeal joint (axial series 4 image 12), and there is a higher degree of fluid seen within the flexor hallucis longus tendon sheath at the level of the midfoot at the most proximal image (axial series 4, image 35/35). There is mild to moderate intermediate T2 signal and thickening tendinosis of the flexor hallucis longus tendon at the level of the mid to distal first metatarsal shaft. There is a small, focal linear split tear within the flexor hallucis longus tendon at the level of the great toe metatarsophalangeal joint (axial images tendon 11). There is diffuse high-grade edema throughout all the visualized plantar musculature. Mild-to-moderate fatty infiltration and atrophy of the interosseous and dorsal musculature. Soft tissues There is a plantar ulcer extending through the entire plantar subcutaneous fat at the level of the great toe metatarsophalangeal joint, just plantar  to the flexor hallucis longus tendon and measuring up to 8 mm in transverse dimension and 12 mm along the longitudinal dimension of the foot (axial series 4, image 12 and sagittal series 7, image 21). IMPRESSION: 1. There is a plantar ulcer extending through the entire plantar subcutaneous fat at the level of the great toe metatarsophalangeal joint, just plantar to the flexor hallucis longus tendon. This contacts the great toe metatarsophalangeal medial and lateral plantar sesamoids, which demonstrate diffuse marrow edema and mild plantar cortical erosion suspicious for acute osteomyelitis. Mild edema within the adjacent plantar aspect of the great toe metatarsal head may be degenerative, however cannot exclude mild acute osteomyelitis. There also appears to be mild cortical erosion within the proximal plantar aspect of the proximal phalanx of  the great toe where there is moderate marrow edema, suspicious for acute osteomyelitis. 2. Mild flexor hallucis longus tenosynovitis and tendinosis as above. 3. Diffuse high-grade edema throughout all the visualized plantar musculature. This is nonspecific and could be related to diabetic myopathy, denervation, and/or infectious myositis. Electronically Signed   By: Neita Garnet M.D.   On: 06/07/2023 17:16   DG Foot 2 Views Right  Result Date: 06/06/2023 CLINICAL DATA:  Osteomyelitis EXAM: RIGHT FOOT - 2 VIEW COMPARISON:  Right foot x-ray 10/15/2022 FINDINGS: There is no evidence of fracture or dislocation. There are mild degenerative changes of the first metatarsophalangeal joint. Peripheral vascular calcifications are present. No cortical erosions identified. Soft tissues are otherwise within normal limits. IMPRESSION: 1. Mild degenerative changes of the first metatarsophalangeal joint. 2. Peripheral vascular calcifications. Electronically Signed   By: Darliss Cheney M.D.   On: 06/06/2023 23:14   DG Chest Portable 1 View  Result Date: 06/06/2023 CLINICAL DATA:   Generalized weakness, fatigue. EXAM: PORTABLE CHEST 1 VIEW COMPARISON:  October 02, 2021. FINDINGS: The heart size and mediastinal contours are within normal limits. Both lungs are clear. The visualized skeletal structures are unremarkable. IMPRESSION: No active disease. Electronically Signed   By: Lupita Raider M.D.   On: 06/06/2023 16:12   - pertinent xrays, CT, MRI studies were reviewed and independently interpreted  Positive ROS: All other systems have been reviewed and were otherwise negative with the exception of those mentioned in the HPI and as above.  Physical Exam: General: Alert, no acute distress Psychiatric: Patient is competent for consent with normal mood and affect Lymphatic: No axillary or cervical lymphadenopathy Cardiovascular: No pedal edema Respiratory: No cyanosis, no use of accessory musculature GI: No organomegaly, abdomen is soft and non-tender    Images:  @ENCIMAGES @  Labs:  Lab Results  Component Value Date   HGBA1C 11.1 (H) 06/07/2023   HGBA1C 6.9 (H) 02/23/2021   HGBA1C 6.5 (A) 12/04/2020   REPTSTATUS 06/07/2023 FINAL 06/06/2023   GRAMSTAIN  01/13/2015    MODERATE WBC PRESENT, PREDOMINANTLY PMN FEW SQUAMOUS EPITHELIAL CELLS PRESENT MODERATE GRAM POSITIVE COCCI IN PAIRS IN CHAINS IN CLUSTERS Performed at [facility]    CULT  06/06/2023    NO GROWTH Performed at [facility], [address] [place], [place] [address]    Jonita Albee 01/13/2015    Lab Results  Component Value Date   ALBUMIN 3.6 06/06/2023   ALBUMIN 4.9 10/02/2021   ALBUMIN 3.5 02/27/2021        Latest Ref Rng & Units 06/08/2023    2:19 AM 06/07/2023    5:27 AM 06/06/2023    4:22 PM  CBC EXTENDED  WBC 4.0 - 10.5 K/uL 12.9  11.3  13.9   RBC 4.22 - 5.81 MIL/uL 4.06  4.05  4.45   Hemoglobin 13.0 - 17.0 g/dL 60.4  54.0  98.1   HCT 39.0 - 52.0 % 32.7  32.9  36.4   Platelets 150 - 400 K/uL 218  242  201   NEUT# 1.7 - 7.7 K/uL   11.6   Lymph#  0.7 - 4.0 K/uL   1.0     Neurologic: Patient does not have protective sensation bilateral lower extremities.   MUSCULOSKELETAL:   Skin: Examination patient has thin atrophic skin to the right lower extremity.  He has an ulcer that probes to bone beneath the first metatarsal head.  Review of the MRI scan shows osteomyelitis of the proximal phalanx great  toe with osteomyelitis of the sesamoid and early osteomyelitis of the first metatarsal head.  Patient has a palpable posterior tibial pulse but does not have a palpable dorsalis pedis or anterior tibial pulse.  He does have a palpable femoral pulse.  White cell count 12.9 with a hemoglobin of 10.4.  Hemoglobin A1c 11.1.  Assessment: Diabetic insensate neuropathy with peripheral vascular disease status post left foot first ray amputation with single-vessel runoff to the left foot through the posterior tibial artery with osteomyelitis of the right great toe MTP joint with clinically single-vessel runoff through the posterior tibial artery on the right.  Plan: Plan: Ankle-brachial indices are ordered.  If patient does have single-vessel runoff through the posterior tibial artery that is adequate for healing would proceed with first ray amputation tomorrow.  If insufficient circulation would consult vascular vein surgery.  Thank you for the consult and the opportunity to see Mr. Fransisco Mooers, MD [facility] [phone] 7:40 AM

## 2023-06-08 NOTE — Plan of Care (Signed)
  Problem: Coping: Goal: Ability to adjust to condition or change in health will improve Outcome: Progressing   Problem: Health Behavior/Discharge Planning: Goal: Ability to manage health-related needs will improve Outcome: Progressing   Problem: Metabolic: Goal: Ability to maintain appropriate glucose levels will improve Outcome: Progressing   Problem: Skin Integrity: Goal: Risk for impaired skin integrity will decrease Outcome: Progressing   Problem: Education: Goal: Knowledge of General Education information will improve Description: Including pain rating scale, medication(s)/side effects and non-pharmacologic comfort measures Outcome: Progressing

## 2023-06-08 NOTE — Progress Notes (Signed)
ABI attempted. Unable to perform due to frequent patient emesis. Donnalee Curry, MD. Will attempt again as schedule and patient availability permits.   Jean Rosenthal, RDMS, RVT on behalf of Raymond, RDMS, RVT

## 2023-06-08 NOTE — Consult Note (Addendum)
ORTHOPAEDIC CONSULTATION  REQUESTING PHYSICIAN: Miguel Rota, MD  Chief Complaint: Ulceration first metatarsal head right foot.  HPI: [name]. is a 72 y.o. male who presents with ulceration first metatarsal head right foot.  Patient has a history of diabetes and peripheral vascular disease.  Patient is status post left foot first ray amputation, and subsequent transtibial amputation in 2016.  Endovascular evaluation at this time showed single-vessel runoff through the posterior tibial artery with occlusion of the anterior tibial and peroneal.  Past Medical History:  Diagnosis Date   Anxiety    COVID-19 virus infection 09/2020   CPAP (continuous positive airway pressure) dependence    Diabetes mellitus without complication (HCC)    GERD (gastroesophageal reflux disease)    Hypertension    Legally blind in left eye, as defined in [place]    PTSD (post-traumatic stress disorder)    PVD (peripheral vascular disease) (HCC) 01/17/2015   Renal disorder    Sleep apnea    Thyroid disease    Past Surgical History:  Procedure Laterality Date   ABDOMINAL AORTAGRAM N/A 01/21/2015   Procedure: ABDOMINAL Ronny Flurry;  Surgeon: Chuck Hint, MD;  Location: [facility] CATH LAB;  Service: Cardiovascular;  Laterality: N/A;   AMPUTATION Left 01/23/2015   Procedure: FIRST RAY AMPUTATION/LEFT FOOT;  Surgeon: Nadara Mustard, MD;  Location: [facility] OR;  Service: Orthopedics;  Laterality: Left;   AMPUTATION Left 02/20/2015   Procedure: AMPUTATION BELOW KNEE;  Surgeon: Nadara Mustard, MD;  Location: [facility] OR;  Service: Orthopedics;  Laterality: Left;   AV FISTULA PLACEMENT     BALLOON DILATION N/A 02/26/2021   Procedure: BALLOON DILATION;  Surgeon: Hilarie Fredrickson, MD;  Location: [facility] ENDOSCOPY;  Service: Endoscopy;  Laterality: N/A;   BIOPSY  02/26/2021   Procedure: BIOPSY;  Surgeon: Hilarie Fredrickson, MD;  Location: [facility] ENDOSCOPY;  Service: Endoscopy;;   COLONOSCOPY     COMBINED KIDNEY-PANCREAS TRANSPLANT Left 2009    ESOPHAGOGASTRODUODENOSCOPY (EGD) WITH PROPOFOL N/A 02/26/2021   Procedure: ESOPHAGOGASTRODUODENOSCOPY (EGD) WITH PROPOFOL;  Surgeon: Hilarie Fredrickson, MD;  Location: [facility] ENDOSCOPY;  Service: Endoscopy;  Laterality: N/A;   INCISION AND DRAINAGE ABSCESS Left 01/13/2015   Procedure: INCISION AND DRAINAGE OF LEFT FOOT FIRST RAY ABCESS;  Surgeon: Kathryne Hitch, MD;  Location: [facility] OR;  Service: Orthopedics;  Laterality: Left;   LOWER EXTREMITY ANGIOGRAM Left 01/21/2015   Procedure: LOWER EXTREMITY ANGIOGRAM;  Surgeon: Chuck Hint, MD;  Location: [facility] CATH LAB;  Service: Cardiovascular;  Laterality: Left;   Social History   Socioeconomic History   Marital status: Married    Spouse name: Not on file   Number of children: Not on file   Years of education: Not on file   Highest education level: Not on file  Occupational History   Not on file  Tobacco Use   Smoking status: Former   Smokeless tobacco: Never   Tobacco comments:    quit i [phone]  Substance and Sexual Activity   Alcohol use: Yes    Comment: 1 beer rarely    Drug use: No   Sexual activity: Not on file  Other Topics Concern   Not on file  Social History Narrative   Not on file   Social Determinants of Health   Financial Resource Strain: Not on file  Food Insecurity: No Food Insecurity (06/07/2023)   Hunger Vital Sign    Worried About Running Out of Food in the Last Year: Never true  Ran Out of Food in the Last Year: Never true  Transportation Needs: Unmet Transportation Needs (06/07/2023)   PRAPARE - Administrator, Civil Service (Medical): Yes    Lack of Transportation (Non-Medical): No  Physical Activity: Not on file  Stress: Not on file  Social Connections: Unknown (02/06/2022)   Received [facility]   Social Network    Social Network: Not on file   Family History  Problem Relation Age of Onset   Hypertension Other    - negative except otherwise stated in the family  history section No Known Allergies Prior to Admission medications   Medication Sig Start Date End Date Taking? Authorizing Provider  amLODipine (NORVASC) 10 MG tablet Take 10 mg by mouth daily.   Yes [provider]  ascorbic acid (VITAMIN C) 250 MG tablet Take 250 mg by mouth once a week. Take 1 tablet by mouth on Monday, Wednesday, and Friday for iron absorption. 06/04/23  Yes [provider]  aspirin EC 81 MG tablet Take 81 mg by mouth daily.   Yes [provider]  atorvastatin (LIPITOR) 80 MG tablet Take 40 mg by mouth at bedtime.    Yes [provider]  cyclobenzaprine (FLEXERIL) 5 MG tablet Take 1 tablet (5 mg total) by mouth 2 (two) times daily as needed for muscle spasms. 01/30/20  Yes Fawze, Mina A, PA-C  doxycycline (VIBRA-TABS) 100 MG tablet Take 1 tablet (100 mg total) by mouth 2 (two) times daily. 02/16/23  Yes Barnie Del R, NP  ergocalciferol (VITAMIN D2) 1.25 MG (50000 UT) capsule Take 50,000 Units by mouth every Wednesday. 05/22/15  Yes [provider]  ferrous sulfate 325 (65 FE) MG tablet Take 325 mg by mouth once a week. Take 1 tablet by mouth on Monday, Wednesday, and Friday 06/04/23  Yes [provider]  furosemide (LASIX) 20 MG tablet Take 1 tablet (20 mg total) by mouth daily. 07/28/17  Yes Osvaldo Shipper, MD  insulin glargine (LANTUS SOLOSTAR) 100 UNIT/ML Solostar Pen Inject 28 Units into the skin daily. 12/04/20  Yes Shamleffer, Konrad Dolores, MD  labetalol (NORMODYNE) 100 MG tablet Take 2 tablets (200 mg total) by mouth 2 (two) times daily. 07/28/17  Yes Osvaldo Shipper, MD  lisinopril (PRINIVIL,ZESTRIL) 10 MG tablet Take 1 tablet (10 mg total) by mouth daily. 01/07/18  Yes Burnadette Pop, MD  losartan (COZAAR) 50 MG tablet Take 50 mg by mouth daily. 02/19/20  Yes [provider]  metFORMIN (GLUCOPHAGE-XR) 500 MG 24 hr tablet Take 1 tablet (500 mg total) by mouth 2 (two) times daily. 12/04/20  Yes Shamleffer, Konrad Dolores, MD  mycophenolate (CELLCEPT) 250 MG capsule Take 1,000 mg by mouth 2 (two) times daily.   Yes [provider]  pantoprazole (PROTONIX) 40 MG tablet Take 1 tablet (40 mg total) by mouth daily. 02/27/21  Yes Marinda Elk, MD  potassium chloride 20 MEQ TBCR Take 20 mEq by mouth daily. 01/07/18  Yes Burnadette Pop, MD  tacrolimus (PROGRAF) 5 MG capsule Take 5 mg by mouth 2 (two) times daily.   Yes [provider]  traZODone (DESYREL) 50 MG tablet Take 50 mg by mouth at bedtime as needed for sleep.   Yes [provider]  sildenafil (VIAGRA) 50 MG tablet Take 50 mg by mouth as needed. 02/29/20   [provider]   MR FOOT RIGHT WO CONTRAST  Result Date: 06/07/2023 CLINICAL DATA:  Soft tissue infection suspected. 72 year old male with  medical history significant for renal and pancreatic transplant at [facility]. History of diabetes and left below-the-knee amputation. Chronic right foot wound. Chills tear. Ulcer and base of right foot. Elevated white blood cell count. EXAM: MRI OF THE RIGHT FOREFOOT WITHOUT CONTRAST TECHNIQUE: Multiplanar, multisequence MR imaging of the right forefoot was performed. No intravenous contrast was administered. COMPARISON:  Right foot radiographs 06/06/2023, 10/15/2022, 05/05/2020 FINDINGS: Bones/Joint/Cartilage The plantar great toe metatarsophalangeal joint ulcer described below is contiguous with fluid contacting the plantar aspect of the great toe metatarsal head and the adjacent proximal phalanx. There is moderate marrow edema throughout the majority of the proximal 75% of the proximal phalanx of the great toe (sagittal series 7, image 23 and coronal series 5 images 10 through 15). There is also high-grade marrow edema within the lateral and moderate marrow edema within the medial great toe metatarsophalangeal joint plantar sesamoids. Severe great toe metatarsal head-plantar sesamoid joint space narrowing with a 10 mm  metatarsal head subcortical cyst. Severe great toe metatarsophalangeal joint space narrowing. Probable mild erosion within the plantar base of the great toe proximal phalanx (axial series 3 images tendon 11). Mild erosion of the plantar aspect of the lateral greater than medial great toe metatarsophalangeal plantar sesamoids. Mild to moderate first and mild second through fifth tarsometatarsal cartilage thinning. The distal aspect of the distal phalanx of the great toe is incompletely imaged. Within this limitation, no definitive marrow edema is seen within the distal phalanx of the great toe, noting sagittal STIR images have better homogeneity of decreased fat signal compared to the T2 fat saturation sequence. Ligaments The Lisfranc ligament complex is intact. The sesamoid phalangeal ligaments at the great toe plantar plate are intact. Muscles and Tendons Fluid within the plantar great toe soft tissue ulcer described below is contiguous with mild fluid within the flexor hallucis longus tendon sheath at the level of the great toe metatarsophalangeal joint (axial series 4 image 12), and there is a higher degree of fluid seen within the flexor hallucis longus tendon sheath at the level of the midfoot at the most proximal image (axial series 4, image 35/35). There is mild to moderate intermediate T2 signal and thickening tendinosis of the flexor hallucis longus tendon at the level of the mid to distal first metatarsal shaft. There is a small, focal linear split tear within the flexor hallucis longus tendon at the level of the great toe metatarsophalangeal joint (axial images tendon 11). There is diffuse high-grade edema throughout all the visualized plantar musculature. Mild-to-moderate fatty infiltration and atrophy of the interosseous and dorsal musculature. Soft tissues There is a plantar ulcer extending through the entire plantar subcutaneous fat at the level of the great toe metatarsophalangeal joint, just plantar  to the flexor hallucis longus tendon and measuring up to 8 mm in transverse dimension and 12 mm along the longitudinal dimension of the foot (axial series 4, image 12 and sagittal series 7, image 21). IMPRESSION: 1. There is a plantar ulcer extending through the entire plantar subcutaneous fat at the level of the great toe metatarsophalangeal joint, just plantar to the flexor hallucis longus tendon. This contacts the great toe metatarsophalangeal medial and lateral plantar sesamoids, which demonstrate diffuse marrow edema and mild plantar cortical erosion suspicious for acute osteomyelitis. Mild edema within the adjacent plantar aspect of the great toe metatarsal head may be degenerative, however cannot exclude mild acute osteomyelitis. There also appears to be mild cortical erosion within the proximal plantar aspect of the proximal phalanx of  the great toe where there is moderate marrow edema, suspicious for acute osteomyelitis. 2. Mild flexor hallucis longus tenosynovitis and tendinosis as above. 3. Diffuse high-grade edema throughout all the visualized plantar musculature. This is nonspecific and could be related to diabetic myopathy, denervation, and/or infectious myositis. Electronically Signed   By: Neita Garnet M.D.   On: 06/07/2023 17:16   DG Foot 2 Views Right  Result Date: 06/06/2023 CLINICAL DATA:  Osteomyelitis EXAM: RIGHT FOOT - 2 VIEW COMPARISON:  Right foot x-ray 10/15/2022 FINDINGS: There is no evidence of fracture or dislocation. There are mild degenerative changes of the first metatarsophalangeal joint. Peripheral vascular calcifications are present. No cortical erosions identified. Soft tissues are otherwise within normal limits. IMPRESSION: 1. Mild degenerative changes of the first metatarsophalangeal joint. 2. Peripheral vascular calcifications. Electronically Signed   By: Darliss Cheney M.D.   On: 06/06/2023 23:14   DG Chest Portable 1 View  Result Date: 06/06/2023 CLINICAL DATA:   Generalized weakness, fatigue. EXAM: PORTABLE CHEST 1 VIEW COMPARISON:  October 02, 2021. FINDINGS: The heart size and mediastinal contours are within normal limits. Both lungs are clear. The visualized skeletal structures are unremarkable. IMPRESSION: No active disease. Electronically Signed   By: Lupita Raider M.D.   On: 06/06/2023 16:12   - pertinent xrays, CT, MRI studies were reviewed and independently interpreted  Positive ROS: All other systems have been reviewed and were otherwise negative with the exception of those mentioned in the HPI and as above.  Physical Exam: General: Alert, no acute distress Psychiatric: Patient is competent for consent with normal mood and affect Lymphatic: No axillary or cervical lymphadenopathy Cardiovascular: No pedal edema Respiratory: No cyanosis, no use of accessory musculature GI: No organomegaly, abdomen is soft and non-tender    Images:  @ENCIMAGES @  Labs:  Lab Results  Component Value Date   HGBA1C 11.1 (H) 06/07/2023   HGBA1C 6.9 (H) 02/23/2021   HGBA1C 6.5 (A) 12/04/2020   REPTSTATUS 06/07/2023 FINAL 06/06/2023   GRAMSTAIN  01/13/2015    MODERATE WBC PRESENT, PREDOMINANTLY PMN FEW SQUAMOUS EPITHELIAL CELLS PRESENT MODERATE GRAM POSITIVE COCCI IN PAIRS IN CHAINS IN CLUSTERS Performed at [facility]    CULT  06/06/2023    NO GROWTH Performed at [facility], [address], [place], [place] [address]    Jonita Albee 01/13/2015    Lab Results  Component Value Date   ALBUMIN 3.6 06/06/2023   ALBUMIN 4.9 10/02/2021   ALBUMIN 3.5 02/27/2021        Latest Ref Rng & Units 06/08/2023    2:19 AM 06/07/2023    5:27 AM 06/06/2023    4:22 PM  CBC EXTENDED  WBC 4.0 - 10.5 K/uL 12.9  11.3  13.9   RBC 4.22 - 5.81 MIL/uL 4.06  4.05  4.45   Hemoglobin 13.0 - 17.0 g/dL 19.1  47.8  29.5   HCT 39.0 - 52.0 % 32.7  32.9  36.4   Platelets 150 - 400 K/uL 218  242  201   NEUT# 1.7 - 7.7 K/uL   11.6   Lymph#  0.7 - 4.0 K/uL   1.0     Neurologic: Patient does not have protective sensation bilateral lower extremities.   MUSCULOSKELETAL:   Skin: Examination patient has thin atrophic skin to the right lower extremity.  He has an ulcer that probes to bone beneath the first metatarsal head.  Review of the MRI scan shows osteomyelitis of the proximal phalanx great  toe with osteomyelitis of the sesamoid and early osteomyelitis of the first metatarsal head.  Patient has a palpable posterior tibial pulse but does not have a palpable dorsalis pedis or anterior tibial pulse.  He does have a palpable femoral pulse.  White cell count 12.9 with a hemoglobin of 10.4.  Hemoglobin A1c 11.1.  Assessment: Diabetic insensate neuropathy with peripheral vascular disease status post left foot first ray amputation with single-vessel runoff to the left foot through the posterior tibial artery with osteomyelitis of the right great toe MTP joint with clinically single-vessel runoff through the posterior tibial artery on the right.  Plan: Plan: Ankle-brachial indices are ordered.  If patient does have single-vessel runoff through the posterior tibial artery that is adequate for healing would proceed with first ray amputation tomorrow.  If insufficient circulation would consult vascular vein surgery.  Thank you for the consult and the opportunity to see Mr. Kalet Vanwye, MD [facility] [phone] 7:40 AM

## 2023-06-08 NOTE — Progress Notes (Signed)
Pharmacy Antibiotic Note- follow up  [name]. is a 73 y.o. male admitted on 06/06/2023 with osteomyelitis .  Pharmacy has been consulted for cefepime and vancomycin dosing.  Plan: Adjust doses as follows: Cefepime 2G IV q8 hours Vancomycin 1500 mg IV daily (eAUC 502) based on Scr 1.11  Height: 5\' 10"  (177.8 cm) Weight: 72.6 kg (160 lb) IBW/kg (Calculated) : 73  Temp (24hrs), Avg:98.4 F (36.9 C), Min:98.1 F (36.7 C), Max:98.5 F (36.9 C)  Recent Labs  Lab 06/06/23 1622 06/07/23 0527 06/07/23 0645 06/08/23 0219  WBC 13.9* 11.3*  --  12.9*  CREATININE 1.10  --  1.20 1.11    Estimated Creatinine Clearance: 63.6 mL/min (by C-G formula based on SCr of 1.11 mg/dL).    No Known Allergies   Microbiology results: 9/8 BCx: ngtd2 9/8 UCx: ngtdF  Thank you for allowing pharmacy to be a part of this patient's care.  Greta Doom BS, PharmD, BCPS Clinical Pharmacist 06/08/2023 9:05 AM  Contact: [phone] after 3 PM  "Be curious, not judgmental..." -Debbora Dus

## 2023-06-08 NOTE — Hospital Course (Addendum)
  Brief Narrative:  72 year old with history of renal/pancreatic transplant at [facility], DM 2, HTN, left leg BKA, OSA, chronic right foot wound woke up on the day of admission with chills.  In the ED he was started on broad-spectrum antibiotics and admitted due to concerns of possible foot infection, Dr. Lajoyce Corners consulted.  MRI of the foot was consistent with osteomyelitis.  ABI performed showing PVD, Dr Lajoyce Corners following, due to persistent nausea, angio postponed.  Prior to performing EGD, patient was noted to have significantly elevated blood pressure with diaphoresis therefore he was canceled.  Workup revealed patient has acute CVA, neurology consulted.   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  MRI foot is consistent with osteomyelitis.  ABI showing PVD.  Dr. Lajoyce Corners & Vasc following.  LE Angio postponed in the setting of dysphagia, nausea  Recurrent SIRS -Tachycardia, diaphoresis and hypertensive.  Wonder if this is related to relative hypoglycemia versus worsening of infection vs uncontrolled BP.  Blood cultures ordered, amp D50 given.  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  Consulted neurology, permissive hypertension.  A1c elevated.  Will check lipid panel  Dysphagia - Has prior history of peptic stricture requiring dilation.  Was planned EGD today but had to be postponed due to his ongoing symptoms as mentioned above.   History of pancreatic/renal transplant, 2008 -On CellCept and tacrolimus.  Follows mostly at the [facility] with Dr. Arrie Aran   Diabetes mellitus type 2 History of gastroparesis.  Adjusting insulin as necessary   Essential hypertension,  uncontrolled - Will slowly resume his home meds   Hyperlipidemia - Statin   Obstructive sleep apnea -Follow-up outpatient  Transfer to [facility].      DVT prophylaxis: Lovenox Code Status: Full code Family Communication:  Status is: Inpatient Remains inpatient appropriate because:Multiple issues, traansfer to [facility]

## 2023-06-08 NOTE — Progress Notes (Signed)
Inpatient Diabetes Program Recommendations  AACE/ADA: New Consensus Statement on Inpatient Glycemic Control (2015)  Target Ranges:  Prepandial:   less than 140 mg/dL      Peak postprandial:   less than 180 mg/dL (1-2 hours)      Critically ill patients:  140 - 180 mg/dL   Lab Results  Component Value Date   GLUCAP 177 (H) 06/08/2023   HGBA1C 11.1 (H) 06/07/2023    Review of Glycemic Control  Latest Reference Range & Units 06/07/23 07:52 06/07/23 12:11 06/07/23 16:42 06/07/23 22:39 06/08/23 08:00  Glucose-Capillary 70 - 99 mg/dL 401 (H) 027 (H) 253 (H) 180 (H) 177 (H)   Diabetes history: DM 2 Outpatient Diabetes medications:   lantus 28 units Daily, Metformin 500 mg bid  Current orders for Inpatient glycemic control:  Novolog 0-6 units tid with meals Semglee 10 units daily  Inpatient Diabetes Program Recommendations:    May consider increasing Novolog correction to sensitive (0-9) tid with meals and increase Semglee to 16 units daily.   Thanks.  Beryl Meager, RN, BC-ADM Inpatient Diabetes Coordinator Pager [phone]  (8a-5p)

## 2023-06-08 NOTE — Progress Notes (Signed)
Pt has been spitting white and clear sputum since this morning, no substance, pt c/o not able to swallow food and he just need to take his time eating.

## 2023-06-09 ENCOUNTER — Inpatient Hospital Stay (HOSPITAL_COMMUNITY): Payer: Medicare HMO

## 2023-06-09 ENCOUNTER — Ambulatory Visit: Payer: Medicare Other | Admitting: Family

## 2023-06-09 ENCOUNTER — Encounter (HOSPITAL_COMMUNITY)
Admission: EM | Disposition: A | Discharge status: Rehabilitation Facility | Payer: Self-pay | Source: Home / Self Care | Attending: Internal Medicine

## 2023-06-09 DIAGNOSIS — M869 Osteomyelitis, unspecified: Secondary | ICD-10-CM | POA: Diagnosis not present

## 2023-06-09 DIAGNOSIS — I70235 Atherosclerosis of native arteries of right leg with ulceration of other part of foot: Secondary | ICD-10-CM

## 2023-06-09 DIAGNOSIS — I7090 Unspecified atherosclerosis: Secondary | ICD-10-CM

## 2023-06-09 LAB — CBC
HCT: 36.9 % — ABNORMAL LOW (ref 39.0–52.0)
Hemoglobin: 11.6 g/dL — ABNORMAL LOW (ref 13.0–17.0)
MCH: 25.2 pg — ABNORMAL LOW (ref 26.0–34.0)
MCHC: 31.4 g/dL (ref 30.0–36.0)
MCV: 80 fL (ref 80.0–100.0)
Platelets: 251 10*3/uL (ref 150–400)
RBC: 4.61 MIL/uL (ref 4.22–5.81)
RDW: 13.9 % (ref 11.5–15.5)
WBC: 16.3 10*3/uL — ABNORMAL HIGH (ref 4.0–10.5)
nRBC: 0 % (ref 0.0–0.2)

## 2023-06-09 LAB — GLUCOSE, CAPILLARY
Glucose-Capillary: 249 mg/dL — ABNORMAL HIGH (ref 70–99)
Glucose-Capillary: 165 mg/dL — ABNORMAL HIGH (ref 70–99)
Glucose-Capillary: 144 mg/dL — ABNORMAL HIGH (ref 70–99)
Glucose-Capillary: 149 mg/dL — ABNORMAL HIGH (ref 70–99)
Glucose-Capillary: 132 mg/dL — ABNORMAL HIGH (ref 70–99)

## 2023-06-09 LAB — BASIC METABOLIC PANEL
Anion gap: 15 (ref 5–15)
BUN: 12 mg/dL (ref 8–23)
CO2: 18 mmol/L — ABNORMAL LOW (ref 22–32)
Calcium: 9 mg/dL (ref 8.9–10.3)
Chloride: 103 mmol/L (ref 98–111)
Creatinine, Ser: 0.93 mg/dL (ref 0.61–1.24)
GFR, Estimated: >60 mL/min (ref >60)
Glucose, Bld: 226 mg/dL — ABNORMAL HIGH (ref 70–99)
Potassium: 4 mmol/L (ref 3.5–5.1)
Sodium: 136 mmol/L (ref 135–145)

## 2023-06-09 LAB — SURGICAL PCR SCREEN
MRSA, PCR: NEGATIVE
Staphylococcus aureus: NEGATIVE

## 2023-06-09 LAB — MAGNESIUM: Magnesium: 1.5 mg/dL — ABNORMAL LOW (ref 1.7–2.4)

## 2023-06-09 SURGERY — AMPUTATION, FOOT, RAY
Anesthesia: Choice | Laterality: Right

## 2023-06-09 MED ORDER — CEFAZOLIN SODIUM-DEXTROSE 2-4 GM/100ML-% IV SOLN: 2.0000 g | INTRAVENOUS | Status: AC

## 2023-06-09 MED ORDER — MAGNESIUM SULFATE 4 GM/100ML IV SOLN
4.0000 g | Freq: Once | INTRAVENOUS | Status: AC
  Administered 2023-06-09: 4 g via INTRAVENOUS
  Filled 2023-06-09: qty 100

## 2023-06-09 MED ORDER — POVIDONE-IODINE 10 % EX SWAB: 2.0000 | Freq: Once | CUTANEOUS | Status: DC

## 2023-06-09 MED ORDER — POLYETHYLENE GLYCOL 3350 17 G PO PACK: 17.0000 g | PACK | Freq: Every day | ORAL | Status: DC | PRN

## 2023-06-09 MED ORDER — CHLORHEXIDINE GLUCONATE 4 % EX SOLN
60.0000 mL | Freq: Once | CUTANEOUS | Status: AC
  Administered 2023-06-09: 4 via TOPICAL
  Filled 2023-06-09: qty 60

## 2023-06-09 MED ORDER — SODIUM CHLORIDE 0.9 % IV SOLN: INTRAVENOUS | Status: AC

## 2023-06-09 NOTE — Plan of Care (Signed)
  Problem: Metabolic: Goal: Ability to maintain appropriate glucose levels will improve Outcome: Progressing   Problem: Skin Integrity: Goal: Risk for impaired skin integrity will decrease Outcome: Progressing   Problem: Elimination: Goal: Will not experience complications related to urinary retention Outcome: Progressing

## 2023-06-09 NOTE — Interval H&P Note (Signed)
History and Physical Interval Note:  06/09/2023 6:32 AM  [name].  has presented today for surgery, with the diagnosis of Osteomyelitis Right Foot.  The various methods of treatment have been discussed with the patient and family. After consideration of risks, benefits and other options for treatment, the patient has consented to  Procedure(s): RIGHT 1ST RAY AMPUTATION (Right) as a surgical intervention.  The patient's history has been reviewed, patient examined, no change in status, stable for surgery.  I have reviewed the patient's chart and labs.  Questions were answered to the patient's satisfaction.     Nadara Mustard

## 2023-06-09 NOTE — Consult Note (Addendum)
Hospital Consult    Reason for Consult: Right foot wound Requesting Physician:  [facility] MRN #:  [id number]  History of Present Illness: This is a 72 y.o. male with past medical history significant for insulin-dependent diabetes mellitus, hypertension, hyperlipidemia.  He was scheduled for first toe ray amputation today with Dr. Lajoyce Corners.  ABIs and TBI's were abnormal.  Surgical history significant for vascular intervention of the left lower extremity ultimately ending and below the knee amputation.  He does not take any blood thinners.  He denies any significant rest pain in the right foot.  He is a former smoker.  He is on a daily aspirin and statin.  Past Medical History:  Diagnosis Date   Anxiety    COVID-19 virus infection 09/2020   CPAP (continuous positive airway pressure) dependence    Diabetes mellitus without complication (HCC)    GERD (gastroesophageal reflux disease)    Hypertension    Legally blind in left eye, as defined in [place]    PTSD (post-traumatic stress disorder)    PVD (peripheral vascular disease) (HCC) 01/17/2015   Renal disorder    Sleep apnea    Thyroid disease     Past Surgical History:  Procedure Laterality Date   ABDOMINAL AORTAGRAM N/A 01/21/2015   Procedure: ABDOMINAL Ronny Flurry;  Surgeon: Chuck Hint, MD;  Location[facility] CATH LAB;  Service: Cardiovascular;  Laterality: N/A;   AMPUTATION Left 01/23/2015   Procedure: FIRST RAY AMPUTATION/LEFT FOOT;  Surgeon: Nadara Mustard, MD;  Location: [facility] OR;  Service: Orthopedics;  Laterality: Left;   AMPUTATION Left 02/20/2015   Procedure: AMPUTATION BELOW KNEE;  Surgeon: Nadara Mustard, MD;  Location: [facility] OR;  Service: Orthopedics;  Laterality: Left;   AV FISTULA PLACEMENT     BALLOON DILATION N/A 02/26/2021   Procedure: BALLOON DILATION;  Surgeon: Hilarie Fredrickson, MD;  Location: [facility] ENDOSCOPY;  Service: Endoscopy;  Laterality: N/A;   BIOPSY  02/26/2021   Procedure: BIOPSY;  Surgeon: Hilarie Fredrickson, MD;  Location: [facility] ENDOSCOPY;   Service: Endoscopy;;   COLONOSCOPY     COMBINED KIDNEY-PANCREAS TRANSPLANT Left 2009   ESOPHAGOGASTRODUODENOSCOPY (EGD) WITH PROPOFOL N/A 02/26/2021   Procedure: ESOPHAGOGASTRODUODENOSCOPY (EGD) WITH PROPOFOL;  Surgeon: Hilarie Fredrickson, MD;  Location: [facility] ENDOSCOPY;  Service: Endoscopy;  Laterality: N/A;   INCISION AND DRAINAGE ABSCESS Left 01/13/2015   Procedure: INCISION AND DRAINAGE OF LEFT FOOT FIRST RAY ABCESS;  Surgeon: Kathryne Hitch, MD;  Location: [facility] OR;  Service: Orthopedics;  Laterality: Left;   LOWER EXTREMITY ANGIOGRAM Left 01/21/2015   Procedure: LOWER EXTREMITY ANGIOGRAM;  Surgeon: Chuck Hint, MD;  Location: [facility] CATH LAB;  Service: Cardiovascular;  Laterality: Left;    No Known Allergies  Prior to Admission medications   Medication Sig Start Date End Date Taking? Authorizing Provider  amLODipine (NORVASC) 10 MG tablet Take 10 mg by mouth daily.   Yes [provider]  ascorbic acid (VITAMIN C) 250 MG tablet Take 250 mg by mouth once a week. Take 1 tablet by mouth on Monday, Wednesday, and Friday for iron absorption. 06/04/23  Yes [provider]  aspirin EC 81 MG tablet Take 81 mg by mouth daily.   Yes [provider]  atorvastatin (LIPITOR) 80 MG tablet Take 40 mg by mouth at bedtime.    Yes [provider]  cyclobenzaprine (FLEXERIL) 5 MG tablet Take 1 tablet (5 mg total) by mouth 2 (two) times daily as needed for muscle spasms. 01/30/20  Yes Michela Pitcher  A, PA-C  doxycycline (VIBRA-TABS) 100 MG tablet Take 1 tablet (100 mg total) by mouth 2 (two) times daily. 02/16/23  Yes Barnie Del R, NP  ergocalciferol (VITAMIN D2) 1.25 MG (50000 UT) capsule Take 50,000 Units by mouth every Wednesday. 05/22/15  Yes [provider]  ferrous sulfate 325 (65 FE) MG tablet Take 325 mg by mouth once a week. Take 1 tablet by mouth on Monday, Wednesday, and Friday 06/04/23  Yes [provider]  furosemide (LASIX) 20 MG tablet Take 1 tablet  (20 mg total) by mouth daily. 07/28/17  Yes Osvaldo Shipper, MD  insulin glargine (LANTUS SOLOSTAR) 100 UNIT/ML Solostar Pen Inject 28 Units into the skin daily. 12/04/20  Yes Shamleffer, Konrad Dolores, MD  labetalol (NORMODYNE) 100 MG tablet Take 2 tablets (200 mg total) by mouth 2 (two) times daily. 07/28/17  Yes Osvaldo Shipper, MD  lisinopril (PRINIVIL,ZESTRIL) 10 MG tablet Take 1 tablet (10 mg total) by mouth daily. 01/07/18  Yes Burnadette Pop, MD  losartan (COZAAR) 50 MG tablet Take 50 mg by mouth daily. 02/19/20  Yes [provider]  metFORMIN (GLUCOPHAGE-XR) 500 MG 24 hr tablet Take 1 tablet (500 mg total) by mouth 2 (two) times daily. 12/04/20  Yes Shamleffer, Konrad Dolores, MD  mycophenolate (CELLCEPT) 250 MG capsule Take 1,000 mg by mouth 2 (two) times daily.   Yes [provider]  pantoprazole (PROTONIX) 40 MG tablet Take 1 tablet (40 mg total) by mouth daily. 02/27/21  Yes Marinda Elk, MD  potassium chloride 20 MEQ TBCR Take 20 mEq by mouth daily. 01/07/18  Yes Burnadette Pop, MD  tacrolimus (PROGRAF) 5 MG capsule Take 5 mg by mouth 2 (two) times daily.   Yes [provider]  traZODone (DESYREL) 50 MG tablet Take 50 mg by mouth at bedtime as needed for sleep.   Yes [provider]  sildenafil (VIAGRA) 50 MG tablet Take 50 mg by mouth as needed. 02/29/20   [provider]    Social History   Socioeconomic History   Marital status: Married    Spouse name: Not on file   Number of children: Not on file   Years of education: Not on file   Highest education level: Not on file  Occupational History   Not on file  Tobacco Use   Smoking status: Former   Smokeless tobacco: Never   Tobacco comments:    quit i [phone]  Substance and Sexual Activity   Alcohol use: Yes    Comment: 1 beer rarely    Drug use: No   Sexual activity: Not on file  Other Topics Concern   Not on file  Social History Narrative   Not on file   Social  Determinants of Health   Financial Resource Strain: Not on file  Food Insecurity: No Food Insecurity (06/07/2023)   Hunger Vital Sign    Worried About Running Out of Food in the Last Year: Never true    Ran Out of Food in the Last Year: Never true  Transportation Needs: Unmet Transportation Needs (06/07/2023)   PRAPARE - Administrator, Civil Service (Medical): Yes    Lack of Transportation (Non-Medical): No  Physical Activity: Not on file  Stress: Not on file  Social Connections: Unknown (02/06/2022)   [facility] from [facility]   Social Network    Social Network: Not on file  Intimate Partner Violence: Not At Risk (06/07/2023)   Humiliation, Afraid, Rape, and Kick questionnaire  Fear of Current or Ex-Partner: No    Emotionally Abused: No    Physically Abused: No    Sexually Abused: No     Family History  Problem Relation Age of Onset   Hypertension Other     ROS: Otherwise negative unless mentioned in HPI  Physical Examination  Vitals:   06/09/23 0731 06/09/23 1038  BP: (!) 166/68 (!) 175/79  Pulse: (!) 110 (!) 107  Resp: 16   Temp: 98.8 F (37.1 C)   SpO2: 100%    Body mass index is 22.96 kg/m.  General:  WDWN in NAD Gait: Not observed HENT: WNL, normocephalic Pulmonary: normal non-labored breathing, without Rales, rhonchi,  wheezing Cardiac: regular Abdomen:  soft, NT/ND, no masses Skin: without rashes Vascular Exam/Pulses: absent R pedal pulse; 1+ R femoral pulse; 2+ L femoral pulse Extremities: diabetic ulcer R dorsal foot Musculoskeletal: no muscle wasting or atrophy  Neurologic: A&O X 3 Psychiatric:  The pt has Normal affect. Lymph:  Unremarkable  CBC    Component Value Date/Time   WBC 16.3 (H) 06/09/2023 0242   RBC 4.61 06/09/2023 0242   HGB 11.6 (L) 06/09/2023 0242   HCT 36.9 (L) 06/09/2023 0242   PLT 251 06/09/2023 0242   MCV 80.0 06/09/2023 0242   MCH 25.2 (L) 06/09/2023 0242   MCHC 31.4 06/09/2023 0242   RDW  13.9 06/09/2023 0242   LYMPHSABS 1.0 06/06/2023 1622   MONOABS 0.9 06/06/2023 1622   EOSABS 0.1 06/06/2023 1622   BASOSABS 0.1 06/06/2023 1622    BMET    Component Value Date/Time   NA 136 06/09/2023 0242   K 4.0 06/09/2023 0242   CL 103 06/09/2023 0242   CO2 18 (L) 06/09/2023 0242   GLUCOSE 226 (H) 06/09/2023 0242   BUN 12 06/09/2023 0242   CREATININE 0.93 06/09/2023 0242   CALCIUM 9.0 06/09/2023 0242   GFRNONAA >60 06/09/2023 0242   GFRAA >60 06/25/2020 1819    COAGS: Lab Results  Component Value Date   INR 1.0 05/05/2020   INR 1.03 02/20/2015   INR 1.11 01/23/2015     Non-Invasive Vascular Imaging:   Right    Rt Pressure (mmHg)IndexWaveform  Comment    +---------+------------------+-----+----------+---------+  Brachial                                  HD access  +---------+------------------+-----+----------+---------+  PTA     118               0.63 monophasic           +---------+------------------+-----+----------+---------+  DP      175               0.94 monophasic           +---------+------------------+-----+----------+---------+  Great Toe32                0.17 Abnormal             +---------+------------------+-----+----------+---------+      ASSESSMENT/PLAN: This is a 72 y.o. male with diabetic wound right dorsal foot  -Mr. Jeriah Mitton is a 72 year old male with surgical history significant for endovascular revascularization of left lower extremity ultimately ending and below the knee amputation.  He was scheduled for right first toe ray amputation today however given abnormal ABIs and TBI's of the right lower extremity this surgery was canceled.  We were consulted to optimize circulation prior to  proceeding with first toe amputation.  He is scheduled for aortogram with right lower extremity runoff tomorrow with Dr. Chestine Spore.  The procedure was discussed in detail with the patient and his wife and they are agreeable to  proceed.  Please keep n.p.o. past midnight.  Consent ordered.  On-call vascular surgeon Dr. Sherral Hammers will evaluate the patient later today and provide further treatment plans.   Emilie Rutter PA-C [facility] [phone]   VASCULAR STAFF ADDENDUM: I have independently interviewed and examined the patient. I agree with the above.  In short, patient is a 72 year old male from [place] with critical limb ischemia of the right lower extremity.  ABIs are depressed, he has a nonpalpable pulse in the foot.  Palpable femorals bilaterally.  After discussing the risks and benefits of right lower extremity angiography in an effort to define and improve distal perfusion for wound healing, clearance elected to proceed.  Please make n.p.o. at midnight, will schedule for tomorrow.  Victorino Sparrow MD [facility] Phone Number: [phone] 06/09/2023 2:23 PM

## 2023-06-09 NOTE — Evaluation (Signed)
Clinical/Bedside Swallow Evaluation Patient Details  Name: [name]. MRN: [id number] Date of Birth: 1950/10/09  Today's Date: 06/09/2023 Time: SLP Start Time (ACUTE ONLY): 1611 SLP Stop Time (ACUTE ONLY): 1630 SLP Time Calculation (min) (ACUTE ONLY): 19 min  Past Medical History:  Past Medical History:  Diagnosis Date   Anxiety    COVID-19 virus infection 09/2020   CPAP (continuous positive airway pressure) dependence    Diabetes mellitus without complication (HCC)    GERD (gastroesophageal reflux disease)    Hypertension    Legally blind in left eye, as defined in [place]    PTSD (post-traumatic stress disorder)    PVD (peripheral vascular disease) (HCC) 01/17/2015   Renal disorder    Sleep apnea    Thyroid disease    Past Surgical History:  Past Surgical History:  Procedure Laterality Date   ABDOMINAL AORTAGRAM N/A 01/21/2015   Procedure: ABDOMINAL Ronny Flurry;  Surgeon: Chuck Hint, MD;  Location[facility] CATH LAB;  Service: Cardiovascular;  Laterality: N/A;   AMPUTATION Left 01/23/2015   Procedure: FIRST RAY AMPUTATION/LEFT FOOT;  Surgeon: Nadara Mustard, MD;  Location: [facility] OR;  Service: Orthopedics;  Laterality: Left;   AMPUTATION Left 02/20/2015   Procedure: AMPUTATION BELOW KNEE;  Surgeon: Nadara Mustard, MD;  Location: [facility] OR;  Service: Orthopedics;  Laterality: Left;   AV FISTULA PLACEMENT     BALLOON DILATION N/A 02/26/2021   Procedure: BALLOON DILATION;  Surgeon: Hilarie Fredrickson, MD;  Location: [facility] ENDOSCOPY;  Service: Endoscopy;  Laterality: N/A;   BIOPSY  02/26/2021   Procedure: BIOPSY;  Surgeon: Hilarie Fredrickson, MD;  Location: [facility] ENDOSCOPY;  Service: Endoscopy;;   COLONOSCOPY     COMBINED KIDNEY-PANCREAS TRANSPLANT Left 2009   ESOPHAGOGASTRODUODENOSCOPY (EGD) WITH PROPOFOL N/A 02/26/2021   Procedure: ESOPHAGOGASTRODUODENOSCOPY (EGD) WITH PROPOFOL;  Surgeon: Hilarie Fredrickson, MD;  Location: [facility] ENDOSCOPY;  Service: Endoscopy;  Laterality: N/A;   INCISION AND DRAINAGE ABSCESS  Left 01/13/2015   Procedure: INCISION AND DRAINAGE OF LEFT FOOT FIRST RAY ABCESS;  Surgeon: Kathryne Hitch, MD;  Location: [facility] OR;  Service: Orthopedics;  Laterality: Left;   LOWER EXTREMITY ANGIOGRAM Left 01/21/2015   Procedure: LOWER EXTREMITY ANGIOGRAM;  Surgeon: Chuck Hint, MD;  Location: [facility] CATH LAB;  Service: Cardiovascular;  Laterality: Left;   HPI:  [name] is a 72 yo male presenting to ED 9/8 with HA and chills. Found to have ulceration first metatarsal head R foot s/p L foot first ray amputation and subsequent transtibial amputation 2016. Per note, endovascular evaluation with single-vessel runoff with occlusion of the anterior tibial and peroneal with plan for surgery 9/12. CXR unremarkable. Per RN note, pt with increased secretions and c/o difficulty swallowing. Per SLP note 2022, pt with history of esophageal stricture without aspiration and recommended a softened diet for improved esophageal clearance. PMH includes renal and pancreatic transplant at [facility], DM, HTN, LLE BKA, obstructive sleep apnea, chronic R foot wound, GERD    Assessment / Plan / Recommendation  Clinical Impression  Pt reports frequent reguritation throughout the day today with both solids and liquids. He states that he has not had a repeat esophogeal dilation since 2022 and that regurgitation and globus sensation has been occurring more regularly PTA. Pt politely declined trials of any solid POs, although took one single sip of water via straw with immediate belching and regurgitation which he was able to reswallow. Discussed options for diet modification with pt, who stated he would prefer to continue  regular diet so he can make appropriate choices regarding what is palatable and a manageable texture. Recommend continuing regular diet with thin liquids as able, although suspect he will greatly benefit from GI input in order to increase intake. Discussed with RN and MD, who states he will place  order for repeat esophagram. Will follow briefly to rule out oropharyngeal dysphagia as pt is able to consume solids.  SLP Visit Diagnosis: Dysphagia, unspecified (R13.10)    Aspiration Risk  Mild aspiration risk    Diet Recommendation Regular;Thin liquid    Liquid Administration via: Cup;Straw Medication Administration: Whole meds with puree Supervision: Patient able to self feed;Intermittent supervision to cue for compensatory strategies Compensations: Slow rate;Small sips/bites Postural Changes: Seated upright at 90 degrees;Remain upright for at least 30 minutes after po intake    Other  Recommendations Recommended Consults: Consider GI evaluation;Consider esophageal assessment Oral Care Recommendations: Oral care BID    Recommendations for follow up therapy are one component of a multi-disciplinary discharge planning process, led by the attending physician.  Recommendations may be updated based on patient status, additional functional criteria and insurance authorization.  Follow up Recommendations No SLP follow up      Assistance Recommended at Discharge    Functional Status Assessment Patient has had a recent decline in their functional status and demonstrates the ability to make significant improvements in function in a reasonable and predictable amount of time.  Frequency and Duration min 2x/week  1 week       Prognosis Prognosis for improved oropharyngeal function: Good Barriers to Reach Goals: Time post onset      Swallow Study   General HPI: [name] is a 72 yo male presenting to ED 9/8 with HA and chills. Found to have ulceration first metatarsal head R foot s/p L foot first ray amputation and subsequent transtibial amputation 2016. Per note, endovascular evaluation with single-vessel runoff with occlusion of the anterior tibial and peroneal with plan for surgery 9/12. CXR unremarkable. Per RN note, pt with increased secretions and c/o difficulty swallowing.  Per SLP note 2022, pt with history of esophageal stricture without aspiration and recommended a softened diet for improved esophageal clearance. PMH includes renal and pancreatic transplant at [facility], DM, HTN, LLE BKA, obstructive sleep apnea, chronic R foot wound, GERD Type of Study: Bedside Swallow Evaluation Previous Swallow Assessment: see HPI Diet Prior to this Study: Regular;Thin liquids (Level 0) Temperature Spikes Noted: No Respiratory Status: Room air History of Recent Intubation: No Behavior/Cognition: Alert;Cooperative;Pleasant mood Oral Cavity Assessment: Within Functional Limits Oral Care Completed by SLP: No Oral Cavity - Dentition: Adequate natural dentition Vision: Functional for self-feeding Self-Feeding Abilities: Able to feed self Patient Positioning: Upright in bed Baseline Vocal Quality: Normal Volitional Cough: Strong Volitional Swallow: Able to elicit    Oral/Motor/Sensory Function Overall Oral Motor/Sensory Function: Within functional limits   Ice Chips Ice chips: Not tested   Thin Liquid Thin Liquid: Within functional limits    Nectar Thick Nectar Thick Liquid: Not tested   Honey Thick Honey Thick Liquid: Not tested   Puree Puree: Not tested   Solid     Solid: Not tested     Gwynneth Aliment, M.A., CF-SLP Speech Language Pathology, Acute Rehabilitation Services  Secure Chat preferred [phone]  06/09/2023,4:51 PM

## 2023-06-09 NOTE — Progress Notes (Signed)
ABI has been completed.    Results can be found under chart review under CV PROC. 06/09/2023 8:56 AM Luretha Eberly RVT, RDMS

## 2023-06-09 NOTE — Plan of Care (Signed)
  Problem: Coping: Goal: Ability to adjust to condition or change in health will improve 06/09/2023 0455 by Curly Rim, RN Outcome: Progressing 06/08/2023 2020 by Curly Rim, RN Outcome: Progressing   Problem: Health Behavior/Discharge Planning: Goal: Ability to identify and utilize available resources and services will improve Outcome: Progressing Goal: Ability to manage health-related needs will improve Outcome: Progressing   Problem: Metabolic: Goal: Ability to maintain appropriate glucose levels will improve 06/09/2023 0455 by Curly Rim, RN Outcome: Progressing 06/08/2023 2020 by Curly Rim, RN Outcome: Progressing   Problem: Nutritional: Goal: Progress toward achieving an optimal weight will improve Outcome: Progressing   Problem: Skin Integrity: Goal: Risk for impaired skin integrity will decrease 06/09/2023 0455 by Curly Rim, RN Outcome: Progressing 06/08/2023 2020 by Curly Rim, RN Outcome: Progressing   Problem: Education: Goal: Knowledge of General Education information will improve Description: Including pain rating scale, medication(s)/side effects and non-pharmacologic comfort measures Outcome: Progressing

## 2023-06-09 NOTE — Progress Notes (Signed)
PROGRESS NOTE    [name].  ZOX:[id number] DOB: [birth year] DOA: 06/06/2023 PCP: [name], MD     Brief Narrative:  72 year old with history of renal/pancreatic transplant at [facility], DM 2, HTN, left leg BKA, OSA, chronic right foot wound woke up on the day of admission with chills.  In the ED he was started on broad-spectrum antibiotics and admitted due to concerns of possible foot infection, Dr. Lajoyce Corners consulted.  MRI of the foot was consistent with osteomyelitis.  ABI performed showing PVD, plans per Dr. 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  MRI foot is consistent with osteomyelitis.  ABI showing PVD.  Further plans per Dr. Lajoyce Corners and vascular team   History of pancreatic/renal transplant, 2008 -On CellCept and tacrolimus.  Follows mostly at the [facility] with Dr. Arrie Aran   Diabetes mellitus type 2 History of gastroparesis.  Adjusting insulin as necessary   Essential hypertension -Norvasc, losartan, Lasix.  IV as needed   Hyperlipidemia - Statin   Obstructive sleep apnea -Follow-up outpatient     DVT prophylaxis: enoxaparin (LOVENOX) injection 40 mg Start: 06/07/23 1000 Code Status: Full code Family Communication: Family at bedside Status is: Inpatient Remains inpatient appropriate because: Concerns of osteomyelitis, on IV antibiotics        Subjective: No complaints doing okay.  Does have some nausea.   Examination:  General exam: Appears calm and comfortable  Respiratory system: Clear to auscultation. Respiratory effort normal. Cardiovascular system: S1 & S2 heard, RRR. No JVD, murmurs, rubs, gallops or clicks. No pedal edema. Gastrointestinal system: Abdomen is nondistended, soft and nontender. No  organomegaly or masses felt. Normal bowel sounds heard. Central nervous system: Alert and oriented. No focal neurological deficits. Extremities: Symmetric 5 x 5 power. Skin: Right lower extremity dressing noted Psychiatry: Judgement and insight appear normal. Mood & affect appropriate.       Diet Orders (From admission, onward)     Start     Ordered   06/09/23 1043  Diet Carb Modified Fluid consistency: Thin  Diet effective now       Question Answer Comment  Diet-HS Snack? Snack-yes   Calorie Level Medium 1600-2000   Fluid consistency: Thin      06/09/23 1042            Objective: Vitals:   06/08/23 1947 06/09/23 0418 06/09/23 0731 06/09/23 1038  BP: (!) 168/73 (!) 179/85 (!) 166/68 (!) 175/79  Pulse: (!) 101 (!) 109 (!) 110 (!) 107  Resp: 20  16   Temp: 98 F (36.7 C) 98.9 F (37.2 C) 98.8 F (37.1 C)   TempSrc: Oral Oral Oral   SpO2: 100% 100% 100%   Weight:      Height:        Intake/Output Summary (Last 24 hours) at 06/09/2023 1159 Last data filed at 06/09/2023 0700 Gross per 24 hour  Intake 596.73 ml  Output 1825 ml  Net -1228.27 ml   Filed Weights   06/06/23 1522  Weight: 72.6 kg    Scheduled Meds:  amLODipine  10 mg Oral Daily   ascorbic acid  250 mg Oral Q M,W,F   aspirin EC  81 mg Oral Daily   atorvastatin  40 mg Oral QHS  enoxaparin (LOVENOX) injection  40 mg Subcutaneous Q24H   furosemide  20 mg Oral Daily   insulin aspart  0-15 Units Subcutaneous TID WC   insulin glargine-yfgn  16 Units Subcutaneous Daily   labetalol  200 mg Oral BID   leptospermum manuka honey  1 Application Topical Daily   losartan  50 mg Oral Daily   mycophenolate  1,000 mg Oral BID   pantoprazole  40 mg Oral Daily   potassium chloride SA  20 mEq Oral Daily   povidone-iodine  2 Application Topical Once   tacrolimus  5 mg Oral BID   Vitamin D (Ergocalciferol)  50,000 Units Oral Q Wed   Continuous Infusions:   ceFAZolin (ANCEF) IV Stopped (06/09/23 0934)    ceFEPime (MAXIPIME) IV 2 g (06/09/23 1037)   vancomycin 1,500 mg (06/08/23 1324)    Nutritional status     Body mass index is 22.96 kg/m.  Data Reviewed:   CBC: Recent Labs  Lab 06/06/23 1622 06/07/23 0527 06/08/23 0219 06/09/23 0242  WBC 13.9* 11.3* 12.9* 16.3*  NEUTROABS 11.6*  --   --   --   HGB 11.5* 10.5* 10.4* 11.6*  HCT 36.4* 32.9* 32.7* 36.9*  MCV 81.8 81.2 80.5 80.0  PLT 201 242 218 251   Basic Metabolic Panel: Recent Labs  Lab 06/06/23 1622 06/07/23 0645 06/08/23 0219 06/09/23 0242  NA 136 134* 138 136  K 3.8 4.8 3.9 4.0  CL 99 102 107 103  CO2 21* 21* 22 18*  GLUCOSE 232* 278* 192* 226*  BUN 20 21 15 12   CREATININE 1.10 1.20 1.11 0.93  CALCIUM 9.6 8.7* 9.0 9.0  MG 2.0 1.9 1.8 1.5*   GFR: Estimated Creatinine Clearance: 75.9 mL/min (by C-G formula based on SCr of 0.93 mg/dL). Liver Function Tests: Recent Labs  Lab 06/06/23 1622  AST 14*  ALT 15  ALKPHOS 99  BILITOT 0.6  PROT 7.2  ALBUMIN 3.6   No results for input(s): "LIPASE", "AMYLASE" in the last 168 hours. No results for input(s): "AMMONIA" in the last 168 hours. Coagulation Profile: No results for input(s): "INR", "PROTIME" in the last 168 hours. Cardiac Enzymes: No results for input(s): "CKTOTAL", "CKMB", "CKMBINDEX", "TROPONINI" in the last 168 hours. BNP (last 3 results) No results for input(s): "PROBNP" in the last 8760 hours. HbA1C: Recent Labs    06/07/23 0527  HGBA1C 11.1*   CBG: Recent Labs  Lab 06/08/23 1635 06/08/23 1823 06/08/23 2113 06/09/23 0757 06/09/23 1104  GLUCAP 97 96 179* 249* 165*   Lipid Profile: No results for input(s): "CHOL", "HDL", "LDLCALC", "TRIG", "CHOLHDL", "LDLDIRECT" in the last 72 hours. Thyroid Function Tests: Recent Labs    06/07/23 0527  TSH 1.563   Anemia Panel: No results for input(s): "VITAMINB12", "FOLATE", "FERRITIN", "TIBC", "IRON", "RETICCTPCT" in the last 72 hours. Sepsis Labs: Recent Labs  Lab 06/07/23 1712   PROCALCITON <0.10    Recent Results (from the past 240 hour(s))  SARS Coronavirus 2 by RT PCR (hospital order, performed in [facility] hospital lab) *cepheid single result test* Anterior Nasal Swab     Status: None   Collection Time: 06/06/23  3:47 PM   Specimen: Anterior Nasal Swab  Result Value Ref Range Status   SARS Coronavirus 2 by RT PCR NEGATIVE NEGATIVE Final    Comment: Performed at [facility], [address] [place], [place] [address]  Blood culture (routine x 2)     Status: None (Preliminary result)   Collection Time:  06/06/23  6:15 PM   Specimen: BLOOD LEFT ARM  Result Value Ref Range Status   Specimen Description BLOOD LEFT ARM  Final   Special Requests   Final    BOTTLES DRAWN AEROBIC AND ANAEROBIC Blood Culture adequate volume   Culture   Final    NO GROWTH 3 DAYS Performed at [facility], [address] [place], [place] [address]    Report Status PENDING  Incomplete  Blood culture (routine x 2)     Status: None (Preliminary result)   Collection Time: 06/06/23  6:15 PM   Specimen: BLOOD LEFT HAND  Result Value Ref Range Status   Specimen Description BLOOD LEFT HAND  Final   Special Requests   Final    BOTTLES DRAWN AEROBIC AND ANAEROBIC Blood Culture adequate volume   Culture   Final    NO GROWTH 3 DAYS Performed at [facility], [address] [place], [place] [address]    Report Status PENDING  Incomplete  Urine Culture     Status: None   Collection Time: 06/06/23  6:34 PM   Specimen: Urine, Clean Catch  Result Value Ref Range Status   Specimen Description URINE, CLEAN CATCH  Final   Special Requests NONE  Final   Culture   Final    NO GROWTH Performed at [facility], [address] [place], [place] [address]    Report Status 06/07/2023 FINAL  Final  Surgical PCR screen     Status: None   Collection Time: 06/09/23  3:59 AM   Specimen: Nasal Mucosa; Nasal Swab  Result Value Ref Range Status   MRSA, PCR NEGATIVE  NEGATIVE Final   Staphylococcus aureus NEGATIVE NEGATIVE Final    Comment: (NOTE) The Xpert SA Assay (FDA approved for NASAL specimens in patients 6 years of age and older), is one component of a comprehensive surveillance program. It is not intended to diagnose infection nor to guide or monitor treatment. Performed at [facility], [address], [place], [place] [address]          Radiology Studies: VAS [place] ABI WITH/WO TBI  Result Date: 06/09/2023  LOWER EXTREMITY DOPPLER STUDY Patient Name:  [name].  Date of Exam:   06/09/2023 Medical Rec #: [id number]           Accession #:    [id number] Date of Birth: [birth year]           Patient Gender: M Patient Age:   58 years Exam Location:  [facility] Procedure:      VAS [place] ABI WITH/WO TBI Referring Phys: MARCUS DUDA --------------------------------------------------------------------------------  Indications: Ulceration, and peripheral artery disease. High Risk         Hypertension, hyperlipidemia, Diabetes, past history of Factors:          smoking. Other Factors: S/P LLE BKA, PAD, renal failure s/p transplant,.  Comparison Study: No previous exams Performing Technologist: Hill, Jody RVT, RDMS  Examination Guidelines: A complete evaluation includes at minimum, Doppler waveform signals and systolic blood pressure reading at the level of bilateral brachial, anterior tibial, and posterior tibial arteries, when vessel segments are accessible. Bilateral testing is considered an integral part of a complete examination. Photoelectric Plethysmograph (PPG) waveforms and toe systolic pressure readings are included as required and additional duplex testing as needed. Limited examinations for reoccurring indications may be performed as noted.  ABI Findings: +---------+------------------+-----+----------+---------+ Right    Rt Pressure (mmHg)IndexWaveform  Comment   +---------+------------------+-----+----------+---------+  Brachial                                   HD access +---------+------------------+-----+----------+---------+ PTA      118               0.63 monophasic          +---------+------------------+-----+----------+---------+ DP       175               0.94 monophasic          +---------+------------------+-----+----------+---------+ Great Toe32                0.17 Abnormal            +---------+------------------+-----+----------+---------+ +--------+------------------+-----+---------+-------+ Left    Lt Pressure (mmHg)IndexWaveform Comment +--------+------------------+-----+---------+-------+ [id number]                    triphasic        +--------+------------------+-----+---------+-------+ PTA                                     BKA     +--------+------------------+-----+---------+-------+ PERO                                    BKA     +--------+------------------+-----+---------+-------+  Summary: Right: Resting right ankle-brachial index indicates mild right lower extremity arterial disease. The right toe-brachial index is abnormal. *See table(s) above for measurements and observations.     Preliminary    MR FOOT RIGHT WO CONTRAST  Result Date: 06/07/2023 CLINICAL DATA:  Soft tissue infection suspected. 72 year old male with medical history significant for renal and pancreatic transplant at [facility]. History of diabetes and left below-the-knee amputation. Chronic right foot wound. Chills tear. Ulcer and base of right foot. Elevated white blood cell count. EXAM: MRI OF THE RIGHT FOREFOOT WITHOUT CONTRAST TECHNIQUE: Multiplanar, multisequence MR imaging of the right forefoot was performed. No intravenous contrast was administered. COMPARISON:  Right foot radiographs 06/06/2023, 10/15/2022, 05/05/2020 FINDINGS: Bones/Joint/Cartilage The plantar great toe metatarsophalangeal joint ulcer described below is contiguous with fluid contacting the plantar aspect of the great toe  metatarsal head and the adjacent proximal phalanx. There is moderate marrow edema throughout the majority of the proximal 75% of the proximal phalanx of the great toe (sagittal series 7, image 23 and coronal series 5 images 10 through 15). There is also high-grade marrow edema within the lateral and moderate marrow edema within the medial great toe metatarsophalangeal joint plantar sesamoids. Severe great toe metatarsal head-plantar sesamoid joint space narrowing with a 10 mm metatarsal head subcortical cyst. Severe great toe metatarsophalangeal joint space narrowing. Probable mild erosion within the plantar base of the great toe proximal phalanx (axial series 3 images tendon 11). Mild erosion of the plantar aspect of the lateral greater than medial great toe metatarsophalangeal plantar sesamoids. Mild to moderate first and mild second through fifth tarsometatarsal cartilage thinning. The distal aspect of the distal phalanx of the great toe is incompletely imaged. Within this limitation, no definitive marrow edema is seen within the distal phalanx of the great toe, noting sagittal STIR images have better homogeneity of decreased fat signal compared to the T2 fat saturation sequence. Ligaments The Lisfranc ligament complex is intact. The sesamoid phalangeal ligaments at  the great toe plantar plate are intact. Muscles and Tendons Fluid within the plantar great toe soft tissue ulcer described below is contiguous with mild fluid within the flexor hallucis longus tendon sheath at the level of the great toe metatarsophalangeal joint (axial series 4 image 12), and there is a higher degree of fluid seen within the flexor hallucis longus tendon sheath at the level of the midfoot at the most proximal image (axial series 4, image 35/35). There is mild to moderate intermediate T2 signal and thickening tendinosis of the flexor hallucis longus tendon at the level of the mid to distal first metatarsal shaft. There is a small,  focal linear split tear within the flexor hallucis longus tendon at the level of the great toe metatarsophalangeal joint (axial images tendon 11). There is diffuse high-grade edema throughout all the visualized plantar musculature. Mild-to-moderate fatty infiltration and atrophy of the interosseous and dorsal musculature. Soft tissues There is a plantar ulcer extending through the entire plantar subcutaneous fat at the level of the great toe metatarsophalangeal joint, just plantar to the flexor hallucis longus tendon and measuring up to 8 mm in transverse dimension and 12 mm along the longitudinal dimension of the foot (axial series 4, image 12 and sagittal series 7, image 21). IMPRESSION: 1. There is a plantar ulcer extending through the entire plantar subcutaneous fat at the level of the great toe metatarsophalangeal joint, just plantar to the flexor hallucis longus tendon. This contacts the great toe metatarsophalangeal medial and lateral plantar sesamoids, which demonstrate diffuse marrow edema and mild plantar cortical erosion suspicious for acute osteomyelitis. Mild edema within the adjacent plantar aspect of the great toe metatarsal head may be degenerative, however cannot exclude mild acute osteomyelitis. There also appears to be mild cortical erosion within the proximal plantar aspect of the proximal phalanx of the great toe where there is moderate marrow edema, suspicious for acute osteomyelitis. 2. Mild flexor hallucis longus tenosynovitis and tendinosis as above. 3. Diffuse high-grade edema throughout all the visualized plantar musculature. This is nonspecific and could be related to diabetic myopathy, denervation, and/or infectious myositis. Electronically Signed   By: Neita Garnet M.D.   On: 06/07/2023 17:16           LOS: 3 days   Time spent= 35 mins    Miguel Rota, MD [facility]  If 7PM-7AM, please contact night-coverage  06/09/2023, 11:59 AM

## 2023-06-09 NOTE — Care Management Important Message (Signed)
Important Message  Patient Details  Name: [name]. MRN: [id number] Date of Birth: [birth year]   Medicare Important Message Given:  Yes     Sherilyn Banker 06/09/2023, 3:30 PM

## 2023-06-10 ENCOUNTER — Inpatient Hospital Stay (HOSPITAL_COMMUNITY): Payer: Medicare HMO

## 2023-06-10 DIAGNOSIS — K222 Esophageal obstruction: Secondary | ICD-10-CM | POA: Diagnosis not present

## 2023-06-10 DIAGNOSIS — R112 Nausea with vomiting, unspecified: Secondary | ICD-10-CM | POA: Diagnosis not present

## 2023-06-10 DIAGNOSIS — M869 Osteomyelitis, unspecified: Secondary | ICD-10-CM | POA: Diagnosis not present

## 2023-06-10 DIAGNOSIS — R1310 Dysphagia, unspecified: Secondary | ICD-10-CM

## 2023-06-10 DIAGNOSIS — I70235 Atherosclerosis of native arteries of right leg with ulceration of other part of foot: Secondary | ICD-10-CM | POA: Diagnosis not present

## 2023-06-10 LAB — BASIC METABOLIC PANEL
Anion gap: 13 (ref 5–15)
BUN: 17 mg/dL (ref 8–23)
CO2: 19 mmol/L — ABNORMAL LOW (ref 22–32)
Calcium: 9 mg/dL (ref 8.9–10.3)
Chloride: 103 mmol/L (ref 98–111)
Creatinine, Ser: 0.93 mg/dL (ref 0.61–1.24)
GFR, Estimated: >60 mL/min (ref >60)
Glucose, Bld: 124 mg/dL — ABNORMAL HIGH (ref 70–99)
Potassium: 3.6 mmol/L (ref 3.5–5.1)
Sodium: 135 mmol/L (ref 135–145)

## 2023-06-10 LAB — CBC
HCT: 39.5 % (ref 39.0–52.0)
Hemoglobin: 13.1 g/dL (ref 13.0–17.0)
MCH: 26.4 pg (ref 26.0–34.0)
MCHC: 33.2 g/dL (ref 30.0–36.0)
MCV: 79.6 fL — ABNORMAL LOW (ref 80.0–100.0)
Platelets: 237 10*3/uL (ref 150–400)
RBC: 4.96 MIL/uL (ref 4.22–5.81)
RDW: 13.9 % (ref 11.5–15.5)
WBC: 15.8 10*3/uL — ABNORMAL HIGH (ref 4.0–10.5)
nRBC: 0 % (ref 0.0–0.2)

## 2023-06-10 LAB — GLUCOSE, CAPILLARY
Glucose-Capillary: 175 mg/dL — ABNORMAL HIGH (ref 70–99)
Glucose-Capillary: 100 mg/dL — ABNORMAL HIGH (ref 70–99)
Glucose-Capillary: 110 mg/dL — ABNORMAL HIGH (ref 70–99)
Glucose-Capillary: 95 mg/dL (ref 70–99)

## 2023-06-10 LAB — MAGNESIUM: Magnesium: 2.2 mg/dL (ref 1.7–2.4)

## 2023-06-10 MED ORDER — ENOXAPARIN SODIUM 40 MG/0.4ML IJ SOSY
40.0000 mg | PREFILLED_SYRINGE | INTRAMUSCULAR | Status: DC
  Administered 2023-06-11 – 2023-06-29 (×18): 40 mg via SUBCUTANEOUS
  Filled 2023-06-10 (×18): qty 0.4

## 2023-06-10 MED ORDER — PANTOPRAZOLE SODIUM 40 MG IV SOLR
40.0000 mg | Freq: Two times a day (BID) | INTRAVENOUS | Status: DC
  Administered 2023-06-10 – 2023-06-22 (×25): 40 mg via INTRAVENOUS
  Filled 2023-06-10 (×23): qty 10

## 2023-06-10 MED ORDER — METOCLOPRAMIDE HCL 5 MG/ML IJ SOLN: 10.0000 mg | Freq: Four times a day (QID) | INTRAMUSCULAR | Status: DC | PRN

## 2023-06-10 NOTE — Progress Notes (Signed)
  Progress Note    06/10/2023 9:54 AM Hospital Day 4  Subjective:  resting; no complaints or questions  Tm 99 now afebrile  Vitals:   06/10/23 0630 06/10/23 0716  BP: (!) 176/77 (!) 165/68  Pulse: (!) 106   Resp:  16  Temp:  98.4 F (36.9 C)  SpO2:  100%    Physical Exam: General:  no distress Lungs:  non labored   CBC    Component Value Date/Time   WBC 15.8 (H) 06/10/2023 0347   RBC 4.96 06/10/2023 0347   HGB 13.1 06/10/2023 0347   HCT 39.5 06/10/2023 0347   PLT 237 06/10/2023 0347   MCV 79.6 (L) 06/10/2023 0347   MCH 26.4 06/10/2023 0347   MCHC 33.2 06/10/2023 0347   RDW 13.9 06/10/2023 0347   LYMPHSABS 1.0 06/06/2023 1622   MONOABS 0.9 06/06/2023 1622   EOSABS 0.1 06/06/2023 1622   BASOSABS 0.1 06/06/2023 1622    BMET    Component Value Date/Time   NA 135 06/10/2023 0347   K 3.6 06/10/2023 0347   CL 103 06/10/2023 0347   CO2 19 (L) 06/10/2023 0347   GLUCOSE 124 (H) 06/10/2023 0347   BUN 17 06/10/2023 0347   CREATININE 0.93 06/10/2023 0347   CALCIUM 9.0 06/10/2023 0347   GFRNONAA >60 06/10/2023 0347   GFRAA >60 06/25/2020 1819    INR    Component Value Date/Time   INR 1.0 05/05/2020 1956     Intake/Output Summary (Last 24 hours) at 06/10/2023 0954 Last data filed at 06/10/2023 0258 Gross per 24 hour  Intake 870.81 ml  Output 500 ml  Net 370.81 ml     Assessment/Plan:  72 y.o. male with right foot wound for arteriography today with Dr. Erlene Quan Day 4  -plan for angiogram later this morning.  Pt does not have any questions.   -will hold this morning's dose of Lovenox and rescheduled it for tomorrow at 1000.   Doreatha Massed, PA-C [facility] [phone] 06/10/2023 9:54 AM  VASCULAR STAFF ADDENDUM: I have independently interviewed and examined the patient. Pt with new onset nausea refractory to Zofran. Unsafe to proceed with intervention that requires him to lay supine with moderate sedation.   Will  reschedule for tomorrow.   Victorino Sparrow MD [facility] Phone Number: [phone] 06/10/2023 9:54 AM

## 2023-06-10 NOTE — Progress Notes (Signed)
PROGRESS NOTE    [name].  ZOX:[id number] DOB: [birth year] DOA: 06/06/2023 PCP: [name], MD     Brief Narrative:  72 year old with history of renal/pancreatic transplant at [facility], DM 2, HTN, left leg BKA, OSA, chronic right foot wound woke up on the day of admission with chills.  In the ED he was started on broad-spectrum antibiotics and admitted due to concerns of possible foot infection, Dr. Lajoyce Corners consulted.  MRI of the foot was consistent with osteomyelitis.  ABI performed showing PVD, Dr Lajoyce Corners following, due to persistent nausea, angio postponed.   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  MRI foot is consistent with osteomyelitis.  ABI showing PVD.  Dr. Lajoyce Corners following.  Plans for angio later today  Dysphagia - Esophagram performed today shows concerns for possible issues with motility/gastroparesis.  GI consulted.  May need endoscopy as he has had previous peptic stricture requiring dilation.  For now on liquid diet.   History of pancreatic/renal transplant, 2008 -On CellCept and tacrolimus.  Follows mostly at the [facility] with Dr. Arrie Aran   Diabetes mellitus type 2 History of gastroparesis.  Adjusting insulin as necessary   Essential hypertension -Norvasc, losartan, Lasix.  IV as needed   Hyperlipidemia - Statin   Obstructive sleep apnea -Follow-up outpatient     DVT prophylaxis: enoxaparin (LOVENOX) injection 40 mg Start: 06/07/23 1000 Code Status: Full code Family Communication: Family at bedside Status is: Inpatient Remains inpatient appropriate because: Concerns of osteomyelitis, on IV antibiotics      Subjective: Seen at bedside, no complaints at this time besides still persistent  nausea   Examination:  General exam: Appears calm and comfortable  Respiratory system: Clear to auscultation. Respiratory effort normal. Cardiovascular system: S1 & S2 heard, RRR. No JVD, murmurs, rubs, gallops or clicks. No pedal edema. Gastrointestinal system: Abdomen is nondistended, soft and nontender. No organomegaly or masses felt. Normal bowel sounds heard. Central nervous system: Alert and oriented. No focal neurological deficits. Extremities: Symmetric 5 x 5 power. Skin: Right foot dressing noted Psychiatry: Judgement and insight appear normal. Mood & affect appropriate.       Diet Orders (From admission, onward)     Start     Ordered   06/11/23 0001  Diet NPO time specified  Diet effective midnight        06/10/23 1032            Objective: Vitals:   06/09/23 2043 06/10/23 0424 06/10/23 0630 06/10/23 0716  BP: (!) 172/80 (!) 187/81 (!) 176/77 (!) 165/68  Pulse: 99 (!) 103 (!) 106   Resp: 16 18  16   Temp: 98.8 F (37.1 C) 99 F (37.2 C)  98.4 F (36.9 C)  TempSrc: Oral Oral  Oral  SpO2: 100% 100%  100%  Weight:      Height:        Intake/Output Summary (Last 24 hours) at 06/10/2023 1249 Last data filed at 06/10/2023 0258 Gross per 24 hour  Intake 870.81 ml  Output 500 ml  Net 370.81 ml   Filed Weights   06/06/23 1522  Weight: 72.6 kg    Scheduled Meds:  amLODipine  10 mg Oral Daily   ascorbic acid  250 mg Oral Q M,W,F   aspirin EC  81 mg Oral Daily   atorvastatin  40 mg Oral QHS   [START ON 06/11/2023] enoxaparin (LOVENOX) injection  40 mg Subcutaneous Q24H   furosemide  20 mg Oral Daily   insulin aspart  0-15 Units Subcutaneous TID WC   insulin glargine-yfgn  16 Units Subcutaneous Daily   labetalol  200 mg Oral BID   leptospermum manuka honey  1 Application Topical Daily   losartan  50 mg Oral Daily   mycophenolate  1,000 mg Oral BID   pantoprazole  40 mg Oral Daily   potassium chloride SA  20 mEq Oral Daily   povidone-iodine  2  Application Topical Once   tacrolimus  5 mg Oral BID   Vitamin D (Ergocalciferol)  50,000 Units Oral Q Wed   Continuous Infusions:  sodium chloride 75 mL/hr at 06/10/23 0628   ceFEPime (MAXIPIME) IV 2 g (06/10/23 0848)   vancomycin 1,500 mg (06/10/23 1249)    Nutritional status     Body mass index is 22.96 kg/m.  Data Reviewed:   CBC: Recent Labs  Lab 06/06/23 1622 06/07/23 0527 06/08/23 0219 06/09/23 0242 06/10/23 0347  WBC 13.9* 11.3* 12.9* 16.3* 15.8*  NEUTROABS 11.6*  --   --   --   --   HGB 11.5* 10.5* 10.4* 11.6* 13.1  HCT 36.4* 32.9* 32.7* 36.9* 39.5  MCV 81.8 81.2 80.5 80.0 79.6*  PLT 201 242 218 251 237   Basic Metabolic Panel: Recent Labs  Lab 06/06/23 1622 06/07/23 0645 06/08/23 0219 06/09/23 0242 06/10/23 0347  NA 136 134* 138 136 135  K 3.8 4.8 3.9 4.0 3.6  CL 99 102 107 103 103  CO2 21* 21* 22 18* 19*  GLUCOSE 232* 278* 192* 226* 124*  BUN 20 21 15 12 17   CREATININE 1.10 1.20 1.11 0.93 0.93  CALCIUM 9.6 8.7* 9.0 9.0 9.0  MG 2.0 1.9 1.8 1.5* 2.2   GFR: Estimated Creatinine Clearance: 75.9 mL/min (by C-G formula based on SCr of 0.93 mg/dL). Liver Function Tests: Recent Labs  Lab 06/06/23 1622  AST 14*  ALT 15  ALKPHOS 99  BILITOT 0.6  PROT 7.2  ALBUMIN 3.6   No results for input(s): "LIPASE", "AMYLASE" in the last 168 hours. No results for input(s): "AMMONIA" in the last 168 hours. Coagulation Profile: No results for input(s): "INR", "PROTIME" in the last 168 hours. Cardiac Enzymes: No results for input(s): "CKTOTAL", "CKMB", "CKMBINDEX", "TROPONINI" in the last 168 hours. BNP (last 3 results) No results for input(s): "PROBNP" in the last 8760 hours. HbA1C: No results for input(s): "HGBA1C" in the last 72 hours. CBG: Recent Labs  Lab 06/09/23 1625 06/09/23 1746 06/09/23 2044 06/10/23 0754 06/10/23 1159  GLUCAP 144* 149* 132* 175* 100*   Lipid Profile: No results for input(s): "CHOL", "HDL", "LDLCALC", "TRIG", "CHOLHDL",  "LDLDIRECT" in the last 72 hours. Thyroid Function Tests: No results for input(s): "TSH", "T4TOTAL", "FREET4", "T3FREE", "THYROIDAB" in the last 72 hours. Anemia Panel: No results for input(s): "VITAMINB12", "FOLATE", "FERRITIN", "TIBC", "IRON", "RETICCTPCT" in the last 72 hours. Sepsis Labs: Recent Labs  Lab 06/07/23 1712  PROCALCITON <0.10    Recent Results (from the past 240 hour(s))  SARS Coronavirus 2 by RT PCR (hospital order, performed in [facility] hospital lab) *cepheid single result test* Anterior Nasal Swab     Status: None   Collection Time: 06/06/23  3:47 PM   Specimen: Anterior Nasal Swab  Result Value Ref Range Status   SARS Coronavirus 2 by RT PCR  NEGATIVE NEGATIVE Final    Comment: Performed at [facility], [address], [place], [place] [address]  Blood culture (routine x 2)     Status: None (Preliminary result)   Collection Time: 06/06/23  6:15 PM   Specimen: BLOOD LEFT ARM  Result Value Ref Range Status   Specimen Description BLOOD LEFT ARM  Final   Special Requests   Final    BOTTLES DRAWN AEROBIC AND ANAEROBIC Blood Culture adequate volume   Culture   Final    NO GROWTH 4 DAYS Performed at [facility], [address] [place], [place] [address]    Report Status PENDING  Incomplete  Blood culture (routine x 2)     Status: None (Preliminary result)   Collection Time: 06/06/23  6:15 PM   Specimen: BLOOD LEFT HAND  Result Value Ref Range Status   Specimen Description BLOOD LEFT HAND  Final   Special Requests   Final    BOTTLES DRAWN AEROBIC AND ANAEROBIC Blood Culture adequate volume   Culture   Final    NO GROWTH 4 DAYS Performed at [facility], [address] [place], [place] [address]    Report Status PENDING  Incomplete  Urine Culture     Status: None   Collection Time: 06/06/23  6:34 PM   Specimen: Urine, Clean Catch  Result Value Ref Range Status   Specimen Description URINE, CLEAN CATCH  Final   Special Requests NONE   Final   Culture   Final    NO GROWTH Performed at [facility], [address] [place], [place] [address]    Report Status 06/07/2023 FINAL  Final  Surgical PCR screen     Status: None   Collection Time: 06/09/23  3:59 AM   Specimen: Nasal Mucosa; Nasal Swab  Result Value Ref Range Status   MRSA, PCR NEGATIVE NEGATIVE Final   Staphylococcus aureus NEGATIVE NEGATIVE Final    Comment: (NOTE) The Xpert SA Assay (FDA approved for NASAL specimens in patients 30 years of age and older), is one component of a comprehensive surveillance program. It is not intended to diagnose infection nor to guide or monitor treatment. Performed at [facility], [address] [place], [place] [address]          Radiology Studies: VAS [place] ABI WITH/WO TBI  Result Date: 06/09/2023  LOWER EXTREMITY DOPPLER STUDY Patient Name:  [name].  Date of Exam:   06/09/2023 Medical Rec #: [id number]           Accession #:    [id number] Date of Birth: 1951/03/16           Patient Gender: M Patient Age:   32 years Exam Location:  [facility] Procedure:      VAS [place] ABI WITH/WO TBI Referring Phys: MARCUS DUDA --------------------------------------------------------------------------------  Indications: Ulceration, and peripheral artery disease. High Risk         Hypertension, hyperlipidemia, Diabetes, past history of Factors:          smoking. Other Factors: S/P LLE BKA, PAD, renal failure s/p transplant,.  Comparison Study: No previous exams Performing Technologist: Hill, Jody RVT, RDMS  Examination Guidelines: A complete evaluation includes at minimum, Doppler waveform signals and systolic blood pressure reading at the level of bilateral brachial, anterior tibial, and posterior tibial arteries, when vessel segments are accessible. Bilateral testing is considered an integral part of a complete examination. Photoelectric Plethysmograph (PPG) waveforms  and toe systolic pressure readings are included as  required and additional duplex testing as needed. Limited examinations for reoccurring indications may be performed as noted.  ABI Findings: +---------+------------------+-----+----------+---------+ Right    Rt Pressure (mmHg)IndexWaveform  Comment   +---------+------------------+-----+----------+---------+ Brachial                                  HD access +---------+------------------+-----+----------+---------+ PTA      118               0.63 monophasic          +---------+------------------+-----+----------+---------+ DP       175               0.94 monophasic          +---------+------------------+-----+----------+---------+ Great Toe32                0.17 Abnormal            +---------+------------------+-----+----------+---------+ +--------+------------------+-----+---------+-------+ Left    Lt Pressure (mmHg)IndexWaveform Comment +--------+------------------+-----+---------+-------+ [id number]                    triphasic        +--------+------------------+-----+---------+-------+ PTA                                     BKA     +--------+------------------+-----+---------+-------+ PERO                                    BKA     +--------+------------------+-----+---------+-------+  Summary: Right: Resting right ankle-brachial index indicates mild right lower extremity arterial disease. The right toe-brachial index is abnormal. *See table(s) above for measurements and observations.     Preliminary            LOS: 4 days   Time spent= 35 mins    Miguel Rota, MD [facility]  If 7PM-7AM, please contact night-coverage  06/10/2023, 12:49 PM

## 2023-06-10 NOTE — Progress Notes (Signed)
  Progress Note    06/10/2023 7:31 AM Hospital Day 4  Subjective:  resting; no complaints or questions  Tm 89 now afebrile  Vitals:   06/10/23 0630 06/10/23 0716  BP: (!) 176/77 (!) 165/68  Pulse: (!) 106   Resp:  16  Temp:  98.4 F (36.9 C)  SpO2:  100%    Physical Exam: General:  no distress Lungs:  non labored   CBC    Component Value Date/Time   WBC 15.8 (H) 06/10/2023 0347   RBC 4.96 06/10/2023 0347   HGB 13.1 06/10/2023 0347   HCT 39.5 06/10/2023 0347   PLT 237 06/10/2023 0347   MCV 79.6 (L) 06/10/2023 0347   MCH 26.4 06/10/2023 0347   MCHC 33.2 06/10/2023 0347   RDW 13.9 06/10/2023 0347   LYMPHSABS 1.0 06/06/2023 1622   MONOABS 0.9 06/06/2023 1622   EOSABS 0.1 06/06/2023 1622   BASOSABS 0.1 06/06/2023 1622    BMET    Component Value Date/Time   NA 135 06/10/2023 0347   K 3.6 06/10/2023 0347   CL 103 06/10/2023 0347   CO2 19 (L) 06/10/2023 0347   GLUCOSE 124 (H) 06/10/2023 0347   BUN 17 06/10/2023 0347   CREATININE 0.93 06/10/2023 0347   CALCIUM 9.0 06/10/2023 0347   GFRNONAA >60 06/10/2023 0347   GFRAA >60 06/25/2020 1819    INR    Component Value Date/Time   INR 1.0 05/05/2020 1956     Intake/Output Summary (Last 24 hours) at 06/10/2023 0731 Last data filed at 06/10/2023 0258 Gross per 24 hour  Intake 870.81 ml  Output 500 ml  Net 370.81 ml     Assessment/Plan:  72 y.o. male with right foot wound for arteriography today with Dr. Erlene Quan Day 4  -plan for angiogram later this morning.  Pt does not have any questions.   -will hold this morning's dose of Lovenox and rescheduled it for tomorrow at 1000.   Doreatha Massed, PA-C [facility] [phone] 06/10/2023 7:31 AM

## 2023-06-10 NOTE — Progress Notes (Signed)
Telephone call to Mrs. 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 She stated that she would be available. Patient informed that wife is coming to assist him.

## 2023-06-10 NOTE — Consult Note (Addendum)
Referring Provider: Dr. Stephania Fragmin Primary Care Physician:  [name], MD Primary Gastroenterologist: Gentry Fitz  Reason for Consultation: Intractable nausea and vomiting  HPI: [name]. is a 72 y.o. 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   He presented to the ED 06/06/2023 with headache and severe shaky chills secondary to sepsis secondary to his chronic right foot ulcer/osteomyelitis. He was started on Zosyn IV. Labs in the ED showed a WBC count of 13.9.  Hemoglobin 11.5 down from 16.7 on 10/02/2021.  Hematocrit 36.4.  Platelet 201.  Sodium 136.  Potassium 3.8.  Glucose 232.  BUN 20.  Creatinine 1.10.  Normal LFTs.  SARS coronavirus 2 negative.  Blood cultures no growth at 4 days.  Urine culture negative.  HIV nonreactive. He was evaluated by vascular surgery who planned on doing an angiogram today which was cancelled secondary to new onset nausea refractory to Zofran. Vascular surgery felt he was at high risk for aspiration as he would need to be in the supine position for the angiogram with moderate sedation. A GI consult was requested for further evaluation regarding intractable nausea and the patient endorsed having dysphagia.Marland Kitchen  He endorses having intermittent heartburn for which he drinks ginger ale as needed.  He has dysphagia on a daily basis for the past year.  He describes having food that gets stuck to the throat/upper esophagus on a daily basis.  He sometimes he hits himself in the throat to expel the stuck food and other times he is able to drink a few sips of water and the food goes down the esophagus.  He underwent an EGD by Dr. Marina Goodell 03/08/2021 which identified GERD with a peptic stricture  which was dilated, reflux esophagitis and atrophic gastric mucosa with several superficial erosions. Biopsy showed mild atrophy with mild chronic gastritis without evidence of H. pylori.  He takes ASA 81 mg daily.  No other NSAID use.  He is passing a normal brown formed bowel movement daily.  No rectal bleeding or black stools.  He underwent 3 colonoscopies in his lifetime at the [place] which he reported were normal, most recent colonoscopy was within the past few years.  No known family history of esophageal, gastric or colorectal cancer.  Barium swallow study with tablet today showed tertiary contractions and poor progression of contrast leading to severe dysmotility and incomplete esophageal emptying and inability to pass a 13 mm tablet.  Prior barium swallow study 02/25/2021 showed circumferentially narrowed appearance of the distal esophagus above the level of the small to moderate size hiatal hernia.  The 13 mm barium tablet did not pass beyond this level despite a prolonged period of observation.  Findings were suspicious for underlying distal esophageal stricture and mild intermittent esophageal dysmotility was noted.   GI PROCEDURES:  EGD 03/08/2021 by Dr. Marina Goodell: 1. GERD complicated by peptic stricture status post dilation  2. Reflux esophagitis  3. Atrophic gastric mucosa with several superficial erosions. Status post biopsies. - Gastric oxyntic mucosa with mild atrophy and mild chronic gastritis  - Warthin Starry stain is negative for Helicobacter pylori   Past Medical History:  Diagnosis Date   Anxiety    COVID-19 virus infection 09/2020   CPAP (continuous positive airway pressure) dependence  Diabetes mellitus without complication (HCC)    GERD (gastroesophageal reflux disease)    Hypertension    Legally blind in left eye, as defined in [place]    PTSD (post-traumatic stress disorder)    PVD (peripheral vascular disease) (HCC) 01/17/2015   Renal disorder    Sleep apnea    Thyroid  disease     Past Surgical History:  Procedure Laterality Date   ABDOMINAL AORTAGRAM N/A 01/21/2015   Procedure: ABDOMINAL Ronny Flurry;  Surgeon: Chuck Hint, MD;  Location: [facility] CATH LAB;  Service: Cardiovascular;  Laterality: N/A;   AMPUTATION Left 01/23/2015   Procedure: FIRST RAY AMPUTATION/LEFT FOOT;  Surgeon: Nadara Mustard, MD;  Location: [facility] OR;  Service: Orthopedics;  Laterality: Left;   AMPUTATION Left 02/20/2015   Procedure: AMPUTATION BELOW KNEE;  Surgeon: Nadara Mustard, MD;  Location: [facility] OR;  Service: Orthopedics;  Laterality: Left;   AV FISTULA PLACEMENT     BALLOON DILATION N/A 02/26/2021   Procedure: BALLOON DILATION;  Surgeon: Hilarie Fredrickson, MD;  Location: [facility] ENDOSCOPY;  Service: Endoscopy;  Laterality: N/A;   BIOPSY  02/26/2021   Procedure: BIOPSY;  Surgeon: Hilarie Fredrickson, MD;  Location: [facility] ENDOSCOPY;  Service: Endoscopy;;   COLONOSCOPY     COMBINED KIDNEY-PANCREAS TRANSPLANT Left 2009   ESOPHAGOGASTRODUODENOSCOPY (EGD) WITH PROPOFOL N/A 02/26/2021   Procedure: ESOPHAGOGASTRODUODENOSCOPY (EGD) WITH PROPOFOL;  Surgeon: Hilarie Fredrickson, MD;  Location: [facility] ENDOSCOPY;  Service: Endoscopy;  Laterality: N/A;   INCISION AND DRAINAGE ABSCESS Left 01/13/2015   Procedure: INCISION AND DRAINAGE OF LEFT FOOT FIRST RAY ABCESS;  Surgeon: Kathryne Hitch, MD;  Location: [facility] OR;  Service: Orthopedics;  Laterality: Left;   LOWER EXTREMITY ANGIOGRAM Left 01/21/2015   Procedure: LOWER EXTREMITY ANGIOGRAM;  Surgeon: Chuck Hint, MD;  Location: [facility] CATH LAB;  Service: Cardiovascular;  Laterality: Left;    Prior to Admission medications   Medication Sig Start Date End Date Taking? Authorizing Provider  amLODipine (NORVASC) 10 MG tablet Take 10 mg by mouth daily.   Yes [provider]  ascorbic acid (VITAMIN C) 250 MG tablet Take 250 mg by mouth once a week. Take 1 tablet by mouth on Monday, Wednesday, and Friday for iron absorption. 06/04/23  Yes [provider]  aspirin  EC 81 MG tablet Take 81 mg by mouth daily.   Yes [provider]  atorvastatin (LIPITOR) 80 MG tablet Take 40 mg by mouth at bedtime.    Yes [provider]  cyclobenzaprine (FLEXERIL) 5 MG tablet Take 1 tablet (5 mg total) by mouth 2 (two) times daily as needed for muscle spasms. 01/30/20  Yes Fawze, Mina A, PA-C  doxycycline (VIBRA-TABS) 100 MG tablet Take 1 tablet (100 mg total) by mouth 2 (two) times daily. 02/16/23  Yes Barnie Del R, NP  ergocalciferol (VITAMIN D2) 1.25 MG (50000 UT) capsule Take 50,000 Units by mouth every Wednesday. 05/22/15  Yes [provider]  ferrous sulfate 325 (65 FE) MG tablet Take 325 mg by mouth once a week. Take 1 tablet by mouth on Monday, Wednesday, and Friday 06/04/23  Yes [provider]  furosemide (LASIX) 20 MG tablet Take 1 tablet (20 mg total) by mouth daily. 07/28/17  Yes Osvaldo Shipper, MD  insulin glargine (LANTUS SOLOSTAR) 100 UNIT/ML Solostar Pen Inject 28 Units into the skin daily. 12/04/20  Yes Shamleffer, Konrad Dolores, MD  labetalol (NORMODYNE) 100 MG tablet Take 2 tablets (200 mg total) by mouth 2 (two) times daily. 07/28/17  Yes Osvaldo Shipper, MD  lisinopril (PRINIVIL,ZESTRIL) 10 MG tablet Take 1 tablet (10 mg total) by mouth daily. 01/07/18  Yes Burnadette Pop, MD  losartan (COZAAR) 50 MG tablet Take 50 mg by mouth daily. 02/19/20  Yes [provider]  metFORMIN (GLUCOPHAGE-XR) 500 MG 24 hr tablet Take 1 tablet (500 mg total) by mouth 2 (two) times daily. 12/04/20  Yes Shamleffer, Konrad Dolores, MD  mycophenolate (CELLCEPT) 250 MG capsule Take 1,000 mg by mouth 2 (two) times daily.   Yes [provider]  pantoprazole (PROTONIX) 40 MG tablet Take 1 tablet (40 mg total) by mouth daily. 02/27/21  Yes Marinda Elk, MD  potassium chloride 20 MEQ TBCR Take 20 mEq by mouth daily. 01/07/18  Yes Burnadette Pop, MD  tacrolimus (PROGRAF) 5 MG capsule Take 5 mg by mouth 2 (two) times daily.   Yes  [provider]  traZODone (DESYREL) 50 MG tablet Take 50 mg by mouth at bedtime as needed for sleep.   Yes [provider]  sildenafil (VIAGRA) 50 MG tablet Take 50 mg by mouth as needed. 02/29/20   [provider]    Current Facility-Administered Medications  Medication Dose Route Frequency Provider Last Rate Last Admin   acetaminophen (TYLENOL) tablet 650 mg  650 mg Oral Q6H PRN Buena Irish, MD       Or   acetaminophen (TYLENOL) suppository 650 mg  650 mg Rectal Q6H PRN Buena Irish, MD       amLODipine (NORVASC) tablet 10 mg  10 mg Oral Daily Buena Irish, MD   10 mg at 06/08/23 1113   ascorbic acid (VITAMIN C) tablet 250 mg  250 mg Oral Q M,W,F Buena Irish, MD   250 mg at 06/07/23 1002   aspirin EC tablet 81 mg  81 mg Oral Daily Buena Irish, MD   81 mg at 06/08/23 1113   atorvastatin (LIPITOR) tablet 40 mg  40 mg Oral QHS Buena Irish, MD   40 mg at 06/07/23 2119   ceFEPIme (MAXIPIME) 2 g in sodium chloride 0.9 % 100 mL IVPB  2 g Intravenous Q8H Reome, Earle J, RPH 200 mL/hr at 06/10/23 0848 2 g at 06/10/23 0848   [START ON 06/11/2023] enoxaparin (LOVENOX) injection 40 mg  40 mg Subcutaneous Q24H Rhyne, Samantha J, PA-C       furosemide (LASIX) tablet 20 mg  20 mg Oral Daily Buena Irish, MD   20 mg at 06/08/23 1112   guaiFENesin (ROBITUSSIN) 100 MG/5ML liquid 5 mL  5 mL Oral Q4H PRN Amin, Ankit C, MD       hydrALAZINE (APRESOLINE) injection 10 mg  10 mg Intravenous Q4H PRN Amin, Ankit C, MD   10 mg at 06/10/23 0431   insulin aspart (novoLOG) injection 0-15 Units  0-15 Units Subcutaneous TID WC Amin, Ankit C, MD   3 Units at 06/10/23 0827   insulin glargine-yfgn (SEMGLEE) injection 16 Units  16 Units Subcutaneous Daily Amin, Ankit C, MD   16 Units at 06/10/23 0828   ipratropium-albuterol (DUONEB) 0.5-2.5 (3) MG/3ML nebulizer solution 3 mL  3 mL Nebulization Q4H PRN Amin, Ankit C, MD       labetalol (NORMODYNE) tablet 200  mg  200 mg Oral BID Buena Irish, MD   200 mg at 06/08/23 1115   leptospermum manuka honey (MEDIHONEY) paste 1 Application  1 Application Topical Daily Amin, Ankit C, MD   1 Application at 06/10/23 0848   losartan (COZAAR) tablet 50 mg  50 mg Oral Daily Buena Irish, MD   50 mg at 06/08/23 1114   melatonin tablet 3 mg  3 mg Oral QHS PRN Buena Irish, MD       metoCLOPramide (REGLAN) injection 10 mg  10 mg Intravenous Q6H PRN Amin, Ankit C, MD       metoprolol tartrate (LOPRESSOR) injection 5 mg  5 mg Intravenous Q4H PRN Amin, Ankit C, MD       mycophenolate (CELLCEPT) capsule 1,000 mg  1,000 mg Oral BID Buena Irish, MD   1,000 mg at 06/08/23 1116   ondansetron (ZOFRAN) tablet 4 mg  4 mg Oral Q6H PRN Buena Irish, MD   4 mg at 06/08/23 2122   Or   ondansetron (ZOFRAN) injection 4 mg  4 mg Intravenous Q6H PRN Buena Irish, MD   4 mg at 06/10/23 0842   pantoprazole (PROTONIX) injection 40 mg  40 mg Intravenous Q12H Quentin Mulling R, PA-C       polyethylene glycol (MIRALAX / GLYCOLAX) packet 17 g  17 g Oral Daily PRN Amin, Ankit C, MD       potassium chloride SA (KLOR-CON M) CR tablet 20 mEq  20 mEq Oral Daily Buena Irish, MD   20 mEq at 06/08/23 1112   povidone-iodine 10 % swab 2 Application  2 Application Topical Once Nadara Mustard, MD       senna-docusate (Senokot-S) tablet 1 tablet  1 tablet Oral QHS PRN Amin, Ankit C, MD       tacrolimus (PROGRAF) capsule 5 mg  5 mg Oral BID Loetta Rough, MD   5 mg at 06/08/23 1115   traZODone (DESYREL) tablet 50 mg  50 mg Oral QHS PRN Buena Irish, MD       vancomycin (VANCOREADY) IVPB 1500 mg/300 mL  1,500 mg Intravenous Q24H Reome, Earle J, RPH 150 mL/hr at 06/10/23 1249 1,500 mg at 06/10/23 1249   Vitamin D (Ergocalciferol) (DRISDOL) 1.25 MG (50000 UNIT) capsule 50,000 Units  50,000 Units Oral Q Margy Clarks, Debarah Crape, MD        Allergies as of 06/06/2023   (No Known Allergies)    Family History   Problem Relation Age of Onset   Hypertension Other     Social History   Socioeconomic History   Marital status: Married    Spouse name: Not on file   Number of children: Not on file   Years of education: Not on file   Highest education level: Not on file  Occupational History   Not on file  Tobacco Use   Smoking status: Former   Smokeless tobacco: Never   Tobacco comments:    quit i [phone]  Substance and Sexual Activity   Alcohol use: Yes    Comment: 1 beer rarely    Drug use: No   Sexual activity: Not on file  Other Topics Concern   Not on file  Social History Narrative   Not on file   Social Determinants of Health   Financial Resource Strain: Not on file  Food Insecurity: No Food Insecurity (06/07/2023)   Hunger Vital Sign    Worried About Running Out of Food in the Last Year: Never true    Ran Out of Food in the Last Year: Never true  Transportation Needs: Unmet Transportation Needs (06/07/2023)   PRAPARE - Administrator, Civil Service (Medical): Yes    Lack of Transportation (Non-Medical): No  Physical Activity: Not on file  Stress: Not  on file  Social Connections: Unknown (02/06/2022)   [facility]   Social Network    Social Network: Not on file  Intimate Partner Violence: Not At Risk (06/07/2023)   Humiliation, Afraid, Rape, and Kick questionnaire    Fear of Current or Ex-Partner: No    Emotionally Abused: No    Physically Abused: No    Sexually Abused: No   Review of Systems: Gen: Denies fever, sweats or chills. No weight loss.  CV: Denies chest pain, palpitations or edema. Resp: Denies cough, shortness of breath of hemoptysis.  GI: See HPI. GU : Denies urinary burning, blood in urine, increased urinary frequency or incontinence. MS:+ Chronic right foot ulcer.   Derm: Denies rash, itchiness, skin lesions or unhealing ulcers. Psych: Denies depression, anxiety, memory loss or confusion. Heme: Denies easy  bruising, bleeding. Neuro:  Denies headaches, dizziness or paresthesias. Endo:  Denies any problems with DM, thyroid or adrenal function.  Physical Exam: Vital signs in last 24 hours: Temp:  [98.2 F (36.8 C)-99 F (37.2 C)] 98.4 F (36.9 C) (09/12 0716) Pulse Rate:  [95-106] 106 (09/12 0630) Resp:  [16-18] 16 (09/12 0716) BP: (153-187)/(68-81) 165/68 (09/12 0716) SpO2:  [100 %] 100 % (09/12 0716) Last BM Date : 06/06/23 General:  Alert 72 year old male in no acute distress. Head:  Normocephalic and atraumatic. Eyes:  No scleral icterus. Conjunctiva pink. Ears:  Normal auditory acuity. Nose:  No deformity, discharge or lesions. Mouth: Poor dentition. No ulcers or lesions.  Neck:  Supple. No lymphadenopathy or thyromegaly.  Lungs: Breath sounds clear throughout. No wheezes, rhonchi or crackles.  Heart: Tachycardic, no murmur. Abdomen:, Nondistended.  Nontender.  Positive bowel sounds to all 4 quadrants. Rectal: Deferred. Musculoskeletal: Foot ulcer dressing intact. Left partial BKA, Pulses:  Normal pulses noted. Extremities: No lower extremity edema. Neurologic:  Alert and  oriented x 4. No focal deficits.  Skin:  Intact without significant lesions or rashes. Psych:  Alert and cooperative. Normal mood and affect.  Intake/Output from previous day: 09/11 0701 - 09/12 0700 In: 870.8 [I.V.:131.5; IV Piggyback:739.3] Out: 500 [Urine:500] Intake/Output this shift: No intake/output data recorded.  Lab Results: Recent Labs    06/08/23 0219 06/09/23 0242 06/10/23 0347  WBC 12.9* 16.3* 15.8*  HGB 10.4* 11.6* 13.1  HCT 32.7* 36.9* 39.5  PLT 218 251 237   BMET Recent Labs    06/08/23 0219 06/09/23 0242 06/10/23 0347  NA 138 136 135  K 3.9 4.0 3.6  CL 107 103 103  CO2 22 18* 19*  GLUCOSE 192* 226* 124*  BUN 15 12 17   CREATININE 1.11 0.93 0.93  CALCIUM 9.0 9.0 9.0   LFT No results for input(s): "PROT", "ALBUMIN", "AST", "ALT", "ALKPHOS", "BILITOT", "BILIDIR",  "IBILI" in the last 72 hours. PT/INR No results for input(s): "LABPROT", "INR" in the last 72 hours. Hepatitis Panel No results for input(s): "HEPBSAG", "HCVAB", "HEPAIGM", "HEPBIGM" in the last 72 hours.    Studies/Results: DG ESOPHAGUS W SINGLE CM (SOL OR THIN BA)  Result Date: 06/10/2023 CLINICAL DATA:  72 year old male with complaint of nausea EXAM: ESOPHAGUS/BARIUM SWALLOW/TABLET STUDY TECHNIQUE: Single contrast examination was performed using Omnipaque 300. This exam was performed by Loyce Dys PA-C, and was supervised and interpreted by Acquanetta Belling, MD. FLUOROSCOPY: Radiation Exposure Index (as provided by the fluoroscopic device): 59.4 mGy Kerma COMPARISON:  None Available. FINDINGS: Swallowing: Appears normal. No vestibular penetration or aspiration seen. Pharynx: Unremarkable. Esophagus: Normal appearance. No masses, lesions, or  ulcers observed. No stricture or areas of narrowing. Esophageal motility: Tertiary contractions noted with poor primary stripping wave leading to incomplete emptying of contrast bolus with stasis. Esophagus does not fully empty despite multiple attempts to clear and additional swallows. Hiatal Hernia: None. Gastroesophageal reflux: None visualized. Ingested 13 mm barium tablet: Became stuck near the lower esophagus and did not pass. Other: Limited evaluation due to nausea. Patient was able to consume contrast, however eventually vomited. Contrast enters the small bowel near the end of exam, but note possible delayed gastric emptying. IMPRESSION: Tertiary contractions and poor progression of contrast leading to severe dysmotility characterized by incomplete esophageal emptying and inability to pass 13 mm tablet. Electronically Signed   By: Acquanetta Belling M.D.   On: 06/10/2023 12:59   VAS [place] ABI WITH/WO TBI  Result Date: 06/09/2023  LOWER EXTREMITY DOPPLER STUDY Patient Name:  URBANO DUNSWORTH.  Date of Exam:   06/09/2023 Medical Rec #: [id number]           Accession  #:    [id number] Date of Birth: Feb 16, 1951           Patient Gender: M Patient Age:   55 years Exam Location:  [facility] Procedure:      VAS [place] ABI WITH/WO TBI Referring Phys: MARCUS DUDA --------------------------------------------------------------------------------  Indications: Ulceration, and peripheral artery disease. High Risk         Hypertension, hyperlipidemia, Diabetes, past history of Factors:          smoking. Other Factors: S/P LLE BKA, PAD, renal failure s/p transplant,.  Comparison Study: No previous exams Performing Technologist: Hill, Jody RVT, RDMS  Examination Guidelines: A complete evaluation includes at minimum, Doppler waveform signals and systolic blood pressure reading at the level of bilateral brachial, anterior tibial, and posterior tibial arteries, when vessel segments are accessible. Bilateral testing is considered an integral part of a complete examination. Photoelectric Plethysmograph (PPG) waveforms and toe systolic pressure readings are included as required and additional duplex testing as needed. Limited examinations for reoccurring indications may be performed as noted.  ABI Findings: +---------+------------------+-----+----------+---------+ Right    Rt Pressure (mmHg)IndexWaveform  Comment   +---------+------------------+-----+----------+---------+ Brachial                                  HD access +---------+------------------+-----+----------+---------+ PTA      118               0.63 monophasic          +---------+------------------+-----+----------+---------+ DP       175               0.94 monophasic          +---------+------------------+-----+----------+---------+ Great Toe32                0.17 Abnormal            +---------+------------------+-----+----------+---------+ +--------+------------------+-----+---------+-------+ Left    Lt Pressure (mmHg)IndexWaveform Comment +--------+------------------+-----+---------+-------+  [id number]                    triphasic        +--------+------------------+-----+---------+-------+ PTA                                     BKA     +--------+------------------+-----+---------+-------+ PERO  BKA     +--------+------------------+-----+---------+-------+  Summary: Right: Resting right ankle-brachial index indicates mild right lower extremity arterial disease. The right toe-brachial index is abnormal. *See table(s) above for measurements and observations.     Preliminary     IMPRESSION/PLAN:  72 year old male admitted to the hospital 06/06/2023 with sepsis secondary to his chronic right foot ulcer/osteomyelitis.  On Cefepime 2gm Vancomycin IV. Evaluated by vascular surgery who planned on doing an angiogram today which was cancelled secondary to new onset nausea refractory to Zofran. Vascular surgery felt he was at high risk for aspiration as he would need to be in the supine position for the angiogram with moderate sedation   Nausea without vomiting, spitting up clear phlegm in setting of chronic dysphagia, at risk for aspiration. Prior EGD 02/2021 showed a peptic esophageal stricture s/p dilatation. Barium swallow study today showed tertiary contractions and poor progression of contrast leading to severe dysmotility and incomplete esophageal emptying and inability to pass a 13 mm tablet -NPO after midnight -EGD tomorrow benefits and risks discussed including risk with sedation, risk of bleeding, perforation and infection  -Continue PPI IV bid -Continue Ondansetron 4 mg p.o. every 6 hours as needed -Cautious use of Reglan regarding potential risk of tardive dyskinesia  GERD -See plan above  S/P renal and pancreatic transplant on Tacrolimus and CellCept      TREGG ETTERS  06/10/2023, 3:36PM   Attending physician's note  I have taken a history, reviewed the chart and examined the patient. I performed a substantive  portion of this encounter, including complete performance of at least one of the key components, in conjunction with the APP. I agree with the APP's note, impression and recommendations.    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

## 2023-06-11 ENCOUNTER — Inpatient Hospital Stay (HOSPITAL_COMMUNITY): Payer: Medicare HMO

## 2023-06-11 ENCOUNTER — Inpatient Hospital Stay (HOSPITAL_COMMUNITY): Payer: Medicare HMO | Admitting: Registered Nurse

## 2023-06-11 ENCOUNTER — Other Ambulatory Visit (HOSPITAL_COMMUNITY): Payer: Medicare HMO

## 2023-06-11 ENCOUNTER — Encounter (HOSPITAL_COMMUNITY)
Admission: EM | Disposition: A | Discharge status: Rehabilitation Facility | Payer: Self-pay | Source: Home / Self Care | Attending: Internal Medicine

## 2023-06-11 DIAGNOSIS — M869 Osteomyelitis, unspecified: Secondary | ICD-10-CM | POA: Diagnosis not present

## 2023-06-11 DIAGNOSIS — I6389 Other cerebral infarction: Secondary | ICD-10-CM | POA: Diagnosis not present

## 2023-06-11 DIAGNOSIS — R1310 Dysphagia, unspecified: Secondary | ICD-10-CM | POA: Diagnosis not present

## 2023-06-11 DIAGNOSIS — I70235 Atherosclerosis of native arteries of right leg with ulceration of other part of foot: Secondary | ICD-10-CM | POA: Diagnosis not present

## 2023-06-11 DIAGNOSIS — K222 Esophageal obstruction: Secondary | ICD-10-CM | POA: Diagnosis not present

## 2023-06-11 DIAGNOSIS — R112 Nausea with vomiting, unspecified: Secondary | ICD-10-CM | POA: Diagnosis not present

## 2023-06-11 DIAGNOSIS — I639 Cerebral infarction, unspecified: Secondary | ICD-10-CM | POA: Diagnosis not present

## 2023-06-11 LAB — BASIC METABOLIC PANEL
Anion gap: 14 (ref 5–15)
BUN: 25 mg/dL — ABNORMAL HIGH (ref 8–23)
CO2: 16 mmol/L — ABNORMAL LOW (ref 22–32)
Calcium: 9.2 mg/dL (ref 8.9–10.3)
Chloride: 106 mmol/L (ref 98–111)
Creatinine, Ser: 1.06 mg/dL (ref 0.61–1.24)
GFR, Estimated: >60 mL/min (ref >60)
Glucose, Bld: 228 mg/dL — ABNORMAL HIGH (ref 70–99)
Potassium: 3.6 mmol/L (ref 3.5–5.1)
Sodium: 136 mmol/L (ref 135–145)

## 2023-06-11 LAB — GLUCOSE, CAPILLARY
Glucose-Capillary: 105 mg/dL — ABNORMAL HIGH (ref 70–99)
Glucose-Capillary: 91 mg/dL (ref 70–99)
Glucose-Capillary: 224 mg/dL — ABNORMAL HIGH (ref 70–99)
Glucose-Capillary: 99 mg/dL (ref 70–99)
Glucose-Capillary: 187 mg/dL — ABNORMAL HIGH (ref 70–99)
Glucose-Capillary: 176 mg/dL — ABNORMAL HIGH (ref 70–99)
Glucose-Capillary: 206 mg/dL — ABNORMAL HIGH (ref 70–99)

## 2023-06-11 LAB — BLOOD GAS, VENOUS
Acid-base deficit: 4.4 mmol/L — ABNORMAL HIGH (ref 0.0–2.0)
Bicarbonate: 18.8 mmol/L — ABNORMAL LOW (ref 20.0–28.0)
Drawn by: 8104
O2 Saturation: 90.8 %
Patient temperature: 37.1
pCO2, Ven: 29 mmHg — ABNORMAL LOW (ref 44–60)
pH, Ven: 7.42 (ref 7.25–7.43)
pO2, Ven: 56 mmHg — ABNORMAL HIGH (ref 32–45)

## 2023-06-11 LAB — CULTURE, BLOOD (ROUTINE X 2)
Culture: NO GROWTH
Culture: NO GROWTH
Special Requests: ADEQUATE
Special Requests: ADEQUATE

## 2023-06-11 LAB — CBC
HCT: 39.5 % (ref 39.0–52.0)
Hemoglobin: 12.9 g/dL — ABNORMAL LOW (ref 13.0–17.0)
MCH: 26.5 pg (ref 26.0–34.0)
MCHC: 32.7 g/dL (ref 30.0–36.0)
MCV: 81.3 fL (ref 80.0–100.0)
Platelets: 279 10*3/uL (ref 150–400)
RBC: 4.86 MIL/uL (ref 4.22–5.81)
RDW: 14.1 % (ref 11.5–15.5)
WBC: 15.9 10*3/uL — ABNORMAL HIGH (ref 4.0–10.5)
nRBC: 0 % (ref 0.0–0.2)

## 2023-06-11 LAB — LACTIC ACID, PLASMA
Lactic Acid, Venous: 1.2 mmol/L (ref 0.5–1.9)
Lactic Acid, Venous: 2.3 mmol/L (ref 0.5–1.9)
Lactic Acid, Venous: 1.5 mmol/L (ref 0.5–1.9)

## 2023-06-11 LAB — MAGNESIUM: Magnesium: 2 mg/dL (ref 1.7–2.4)

## 2023-06-11 SURGERY — CANCELLED PROCEDURE
Anesthesia: Monitor Anesthesia Care

## 2023-06-11 MED ORDER — LACTATED RINGERS IV SOLN: INTRAVENOUS | Status: DC

## 2023-06-11 MED ORDER — DEXTROSE 50 % IV SOLN
1.0000 | Freq: Once | INTRAVENOUS | Status: AC
  Administered 2023-06-11: 50 mL via INTRAVENOUS
  Filled 2023-06-11: qty 50

## 2023-06-11 MED ORDER — INSULIN GLARGINE-YFGN 100 UNIT/ML ~~LOC~~ SOLN: 8.0000 [IU] | Freq: Every day | SUBCUTANEOUS | Status: DC

## 2023-06-11 MED ORDER — SODIUM CHLORIDE 0.9 % IV SOLN: INTRAVENOUS | Status: AC

## 2023-06-11 MED ORDER — SODIUM CHLORIDE 0.9 % IV SOLN: INTRAVENOUS | Status: DC

## 2023-06-11 MED ORDER — KETOROLAC TROMETHAMINE 15 MG/ML IJ SOLN
15.0000 mg | Freq: Once | INTRAMUSCULAR | Status: AC
  Administered 2023-06-11: 15 mg via INTRAVENOUS
  Filled 2023-06-11: qty 1

## 2023-06-11 MED ORDER — PROCHLORPERAZINE EDISYLATE 10 MG/2ML IJ SOLN
5.0000 mg | Freq: Once | INTRAMUSCULAR | Status: AC
  Administered 2023-06-11: 5 mg via INTRAVENOUS
  Filled 2023-06-11: qty 2

## 2023-06-11 MED ORDER — INSULIN GLARGINE-YFGN 100 UNIT/ML ~~LOC~~ SOLN
8.0000 [IU] | Freq: Every day | SUBCUTANEOUS | Status: DC
  Administered 2023-06-11: 8 [IU] via SUBCUTANEOUS
  Filled 2023-06-11 (×2): qty 0.08

## 2023-06-11 MED ORDER — PHENYLEPHRINE HCL-NACL 20-0.9 MG/250ML-% IV SOLN
INTRAVENOUS | Status: AC
  Filled 2023-06-11: qty 1000

## 2023-06-11 MED ORDER — HYDRALAZINE HCL 20 MG/ML IJ SOLN
10.0000 mg | INTRAMUSCULAR | Status: DC | PRN
  Administered 2023-06-11 – 2023-06-18 (×6): 10 mg via INTRAVENOUS
  Filled 2023-06-11 (×6): qty 1

## 2023-06-11 MED ORDER — DEXTROSE-SODIUM CHLORIDE 5-0.45 % IV SOLN: INTRAVENOUS | Status: AC

## 2023-06-11 MED ORDER — HYDRALAZINE HCL 20 MG/ML IJ SOLN
10.0000 mg | Freq: Once | INTRAMUSCULAR | Status: AC
  Administered 2023-06-11: 10 mg via INTRAVENOUS

## 2023-06-11 MED ORDER — LABETALOL HCL 5 MG/ML IV SOLN
10.0000 mg | Freq: Once | INTRAVENOUS | Status: AC
  Administered 2023-06-11: 10 mg via INTRAVENOUS
  Filled 2023-06-11: qty 4

## 2023-06-11 MED ORDER — PROPOFOL 1000 MG/100ML IV EMUL
INTRAVENOUS | Status: AC
  Filled 2023-06-11: qty 100

## 2023-06-11 MED ORDER — GADOBUTROL 1 MMOL/ML IV SOLN
7.5000 mL | Freq: Once | INTRAVENOUS | Status: AC | PRN
  Administered 2023-06-11: 7.5 mL via INTRAVENOUS

## 2023-06-11 MED ORDER — STROKE: EARLY STAGES OF RECOVERY BOOK
Freq: Once | Status: AC
  Filled 2023-06-11: qty 1

## 2023-06-11 SURGICAL SUPPLY — 15 items

## 2023-06-11 NOTE — Progress Notes (Signed)
Patient arrived to unit via bed. BP remains elevated, Dr. Nelson Chimes made aware and at bedside. New orders received, PRN medications given along with scheduled BP medications. Patient reports intermittent nausea but requesting something to drink. Dr. Nelson Chimes placed order for full liquid. Patient able to take PO meds without difficulty at this time. Call bell and belongings within reach.

## 2023-06-11 NOTE — Progress Notes (Addendum)
Pt brought down to endoscopy.  Pt sweating and shivering.  Bp 191/75 upon arrival to unit.  BP continuously monitored, BP decreased to 170/878.  Dr. Bradley Ferris of patient condition.  Case cancelled.

## 2023-06-11 NOTE — Progress Notes (Addendum)
  Progress Note    06/11/2023 6:54 AM Hospital Day 5  Subjective:  sleeping; received compazine at 5am.  Pt for endoscopy today.  RN reports pt skin was clammy and he was diaphoretic overnight.    afebrile  Vitals:   06/11/23 0535 06/11/23 0620  BP: (!) 117/55 117/63  Pulse: 87 87  Resp: 18 18  Temp: 98.3 F (36.8 C) 98.2 F (36.8 C)  SpO2: 100% 100%    Physical Exam: General:  no distress Lungs:  non labored   CBC    Component Value Date/Time   WBC 15.8 (H) 06/10/2023 0347   RBC 4.96 06/10/2023 0347   HGB 13.1 06/10/2023 0347   HCT 39.5 06/10/2023 0347   PLT 237 06/10/2023 0347   MCV 79.6 (L) 06/10/2023 0347   MCH 26.4 06/10/2023 0347   MCHC 33.2 06/10/2023 0347   RDW 13.9 06/10/2023 0347   LYMPHSABS 1.0 06/06/2023 1622   MONOABS 0.9 06/06/2023 1622   EOSABS 0.1 06/06/2023 1622   BASOSABS 0.1 06/06/2023 1622    BMET    Component Value Date/Time   NA 135 06/10/2023 0347   K 3.6 06/10/2023 0347   CL 103 06/10/2023 0347   CO2 19 (L) 06/10/2023 0347   GLUCOSE 124 (H) 06/10/2023 0347   BUN 17 06/10/2023 0347   CREATININE 0.93 06/10/2023 0347   CALCIUM 9.0 06/10/2023 0347   GFRNONAA >60 06/10/2023 0347   GFRAA >60 06/25/2020 1819    INR    Component Value Date/Time   INR 1.0 05/05/2020 1956     Intake/Output Summary (Last 24 hours) at 06/11/2023 0654 Last data filed at 06/11/2023 0400 Gross per 24 hour  Intake 0 ml  Output 325 ml  Net -325 ml     Assessment/Plan:  72 y.o. male with right foot wound   Hospital Day 5  -pt still with dry heaves overnight and RN reports he was clammy and diaphoretic.  pt is scheduled for endoscopy today with GI.  Most likely arteriogram will be rescheduled for next week.    Doreatha Massed, PA-C [facility] [phone] 06/11/2023 6:54 AM   VASCULAR STAFF ADDENDUM: I agree with the above.  Plan for next angio Tuesday due to endoscopy today   Victorino Sparrow MD [facility] Phone Number: [phone] 06/11/2023 1:22 PM

## 2023-06-11 NOTE — Significant Event (Signed)
Rapid Response Event Note   Reason for Call :  Called earlier in the night for YELLOW MEWS(HR-115). At that time SBP-187/84, and per RN, pt was clammy/diaphoretic/nauseous and saying he just didn't "feel right." RN gave pt 10mg  hydralazine. On RRT assessment at 2125, pt was resting in bed, skin warm to touch, and pt reported that he felt better.   RRT called back at 0436 d/t same s/s(SBP-180s, HR-118, pt clammy/diaphoretic/nauseous). 10mg  hydralazine had already been given. RRT recommended 5mg  lopressor prn order to be given. This was given at 0444. Pt seen by RRT at 0511.  Initial Focused Assessment:  Pt lying in bed with eyes closed. He is in no visible distress but skin is clammy/diaphoretic. He denies CP/SOB/dizziness. He is c/o of a slight HA. Lungs clear/diminished.   T-98.3, HR-98, BP-157/73, RR-18, SpO2-100% on RA.  Interventions:  EKG-NSR 10mg  compazine given IV, 5mg  metoprolol IV prn order given-PTA RRT Additional 10mg  hydralazine given IV Plan of Care:  BP-117/55 after interventions. Pt no longer diaphoretic and says he feels better. Continue to montior pt. Call RRT if further assistance needed.   Event Summary:   MD Notified: Casimer Bilis NP Call [phone] Arrival [phone] End XLKG:4010  Terrilyn Saver, RN

## 2023-06-11 NOTE — Progress Notes (Signed)
Pharmacy Antibiotic Note- follow up  [name]. is a 72 y.o. male admitted on 06/06/2023 with right foot ulcer with osteomyelitis.  Pharmacy has been consulted for cefepime and vancomycin dosing.  Plans for R 1st wray amputation but procedures are delayed -SCr= 1.0   Plan: Continue Cefepime 2G IV q8 hours Continue Vancomycin 1500 mg IV daily (eAUC 502) based on Scr 1.11 Will check a vancomycin trough 9/14  Height: 5\' 10"  (177.8 cm) Weight: 72.6 kg (160 lb 0.9 oz) IBW/kg (Calculated) : 73  Temp (24hrs), Avg:98.4 F (36.9 C), Min:97.6 F (36.4 C), Max:99.6 F (37.6 C)  Recent Labs  Lab 06/07/23 0527 06/07/23 0645 06/08/23 0219 06/09/23 0242 06/10/23 0347 06/11/23 0935  WBC 11.3*  --  12.9* 16.3* 15.8* 15.9*  CREATININE  --  1.20 1.11 0.93 0.93 1.06  LATICACIDVEN  --   --   --   --   --  1.2    Estimated Creatinine Clearance: 66.6 mL/min (by C-G formula based on SCr of 1.06 mg/dL).    No Known Allergies   Microbiology results: 9/8 BCx: ngtd2 9/8 UCx: neg  Thank you for allowing pharmacy to be a part of this patient's care.  Harland German, PharmD Clinical Pharmacist **Pharmacist phone directory can now be found on [email or web address] ([email or web address]).  Listed under [facility].

## 2023-06-11 NOTE — Consult Note (Addendum)
Neurology Consultation  Reason for Consult: Right frontal centrum semiovale stroke Referring Physician: Dr. Nelson Chimes  CC: None  History is obtained from: Patient and chart  HPI: [name]. is a 72 y.o. male with history of renal and pancreatic transplant, diabetes, hypertension, left BKA, sleep apnea and right foot wound who was admitted with osteomyelitis of the right foot.  During admission, patient developed headaches, dizziness and dysphagia which prompted EGD.  Prior to EGD, patient had significantly elevated blood pressure and diaphoresis.  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  Patient also has had some visual difficulties, and he has been dropping objects with his left hand.    However, stroke is very small and does not correlate with most of his deficits.  LKW: Unclear TNK given?: no, completed stroke on MRI IR Thrombectomy? No, completed stroke on MRI Modified Rankin Scale: 2-Slight disability-UNABLE to perform all activities but does not need assistance  ROS: A complete ROS was performed and is negative except as noted in the HPI.   Past Medical History:  Diagnosis Date   Anxiety    COVID-19 virus infection 09/2020   CPAP (continuous positive airway pressure) dependence    Diabetes mellitus without complication (HCC)    GERD (gastroesophageal reflux disease)    Hypertension    Legally blind in left eye, as defined in [place]    PTSD (post-traumatic stress disorder)    PVD (peripheral vascular disease) (HCC) 01/17/2015   Renal disorder    Sleep apnea    Thyroid disease      Family History  Problem Relation Age of Onset   Hypertension Other      Social History:   reports that he has quit smoking. He has never used smokeless tobacco. He reports current alcohol use. He reports that he does not use drugs.  Medications  Current Facility-Administered Medications:    [START ON 06/12/2023]  stroke: early stages of  recovery book, , Does not apply, Once, de [place] Torre, Cortney E, NP   0.9 %  sodium chloride infusion, , Intravenous, Continuous, Amin, Ankit C, MD, Last Rate: 75 mL/hr at 06/11/23 1130, New Bag at 06/11/23 1130   acetaminophen (TYLENOL) tablet 650 mg, 650 mg, Oral, Q6H PRN **OR** acetaminophen (TYLENOL) suppository 650 mg, 650 mg, Rectal, Q6H PRN, Buena Irish, MD   ascorbic acid (VITAMIN C) tablet 250 mg, 250 mg, Oral, Q M,W,F, Buena Irish, MD, 250 mg at 06/07/23 1002   aspirin EC tablet 81 mg, 81 mg, Oral, Daily, Buena Irish, MD, 81 mg at 06/11/23 1131   atorvastatin (LIPITOR) tablet 40 mg, 40 mg, Oral, QHS, Buena Irish, MD, 40 mg at 06/07/23 2119   ceFEPIme (MAXIPIME) 2 g in sodium chloride 0.9 % 100 mL IVPB, 2 g, Intravenous, Q8H, Reome, Earle J, RPH, Last Rate: 200 mL/hr at 06/11/23 1123, 2 g at 06/11/23 1123   dextrose 5 % and 0.45 % NaCl infusion, , Intravenous, Continuous, Amin, Ankit C, MD, Last Rate: 75 mL/hr at 06/11/23 0856, New Bag at 06/11/23 0856   enoxaparin (LOVENOX) injection 40 mg, 40 mg, Subcutaneous, Q24H, Rhyne, Samantha J, PA-C, 40 mg at 06/11/23 1114   furosemide (LASIX) tablet 20 mg, 20 mg, Oral, Daily, Buena Irish, MD, 20 mg at 06/11/23 1132   guaiFENesin (ROBITUSSIN) 100 MG/5ML liquid 5 mL, 5 mL, Oral, Q4H PRN, Amin, Ankit C, MD   hydrALAZINE (APRESOLINE) injection 10 mg, 10 mg, Intravenous, Q4H PRN, Nelson Chimes, Ankit C, MD,  10 mg at 06/11/23 1114   insulin aspart (novoLOG) injection 0-15 Units, 0-15 Units, Subcutaneous, TID WC, Amin, Ankit C, MD, 3 Units at 06/10/23 0827   insulin glargine-yfgn (SEMGLEE) injection 8 Units, 8 Units, Subcutaneous, Daily, Amin, Ankit C, MD, 8 Units at 06/11/23 1325   ipratropium-albuterol (DUONEB) 0.5-2.5 (3) MG/3ML nebulizer solution 3 mL, 3 mL, Nebulization, Q4H PRN, Amin, Ankit C, MD   leptospermum manuka honey (MEDIHONEY) paste 1 Application, 1 Application, Topical, Daily, Amin, Ankit C, MD, 1 Application at  06/11/23 1317   losartan (COZAAR) tablet 50 mg, 50 mg, Oral, Daily, Buena Irish, MD, 50 mg at 06/11/23 1132   melatonin tablet 3 mg, 3 mg, Oral, QHS PRN, Buena Irish, MD   metoCLOPramide (REGLAN) injection 10 mg, 10 mg, Intravenous, Q6H PRN, Amin, Ankit C, MD   metoprolol tartrate (LOPRESSOR) injection 5 mg, 5 mg, Intravenous, Q4H PRN, Amin, Ankit C, MD, 5 mg at 06/11/23 0932   mycophenolate (CELLCEPT) capsule 1,000 mg, 1,000 mg, Oral, BID, Buena Irish, MD, 1,000 mg at 06/11/23 1131   ondansetron (ZOFRAN) tablet 4 mg, 4 mg, Oral, Q6H PRN, 4 mg at 06/08/23 2122 **OR** ondansetron (ZOFRAN) injection 4 mg, 4 mg, Intravenous, Q6H PRN, Buena Irish, MD, 4 mg at 06/11/23 0931   pantoprazole (PROTONIX) injection 40 mg, 40 mg, Intravenous, Q12H, Doree Albee, PA-C, 40 mg at 06/11/23 1114   polyethylene glycol (MIRALAX / GLYCOLAX) packet 17 g, 17 g, Oral, Daily PRN, Amin, Ankit C, MD   potassium chloride SA (KLOR-CON M) CR tablet 20 mEq, 20 mEq, Oral, Daily, Buena Irish, MD, 20 mEq at 06/08/23 1112   senna-docusate (Senokot-S) tablet 1 tablet, 1 tablet, Oral, QHS PRN, Amin, Ankit C, MD   tacrolimus (PROGRAF) capsule 5 mg, 5 mg, Oral, BID, Loetta Rough, MD, 5 mg at 06/11/23 1317   traZODone (DESYREL) tablet 50 mg, 50 mg, Oral, QHS PRN, Buena Irish, MD   vancomycin (VANCOREADY) IVPB 1500 mg/300 mL, 1,500 mg, Intravenous, Q24H, Reome, Earle J, RPH, Last Rate: 150 mL/hr at 06/10/23 1249, 1,500 mg at 06/10/23 1249   Vitamin D (Ergocalciferol) (DRISDOL) 1.25 MG (50000 UNIT) capsule 50,000 Units, 50,000 Units, Oral, Q Wed, Buena Irish, MD   Exam: Current vital signs: BP (!) 129/58   Pulse 85   Temp 98.4 F (36.9 C) (Oral)   Resp 20   Ht 5\' 10"  (1.778 m)   Wt 72.6 kg   SpO2 99%   BMI 22.97 kg/m  Vital signs in last 24 hours: Temp:  [97.6 F (36.4 C)-99.6 F (37.6 C)] 98.4 F (36.9 C) (09/13 1231) Pulse Rate:  [85-118] 85 (09/13 1231) Resp:   [15-20] 20 (09/13 1231) BP: (117-207)/(55-84) 129/58 (09/13 1231) SpO2:  [99 %-100 %] 99 % (09/13 1231) Weight:  [72.6 kg] 72.6 kg (09/13 0755)  GENERAL: Awake, alert, in no acute distress Psych: Affect appropriate for situation, patient is calm and cooperative with examination Head: Normocephalic and atraumatic, without obvious abnormality EENT: Normal conjunctivae, moist mucous membranes, no OP obstruction LUNGS: Normal respiratory effort. Non-labored breathing on room air CV: Regular rate and rhythm on telemetry Extremities: warm, well perfused, without obvious deformity  NEURO:  Mental Status: Awake, alert, and oriented to person, place, time, and situation. He/She is able to provide a clear and coherent history of present illness. Speech/Language: speech is clear and fluent.   No neglect is noted Cranial Nerves:  II: PERRL some visual deficits but able to sense motion in right and left visual  fields III, IV, VI: EOMI. Lid elevation symmetric and full.  V: Sensation is intact to light touch and symmetrical to face.  VII: Face is symmetric resting and smiling.  VIII: Hearing intact to voice IX, X: Phonation normal.  XI: Normal sternocleidomastoid and trapezius muscle strength XII: Tongue protrudes midline without fasciculations.   Motor: 5/5 strength is all muscle groups.  Tone is normal. Bulk is normal.  Sensation: Intact to light touch bilaterally in all four extremities. No extinction to DSS present.  Coordination: FTN intact bilaterally. No pronator drift.  Gait: Deferred  NIHSS: 1a Level of Conscious.: 0 1b LOC Questions: 0 1c LOC Commands: 0 2 Best Gaze: 0 3 Visual: 0 4 Facial Palsy: 0 5a Motor Arm - left: 0 5b Motor Arm - Right: 0 6a Motor Leg - Left: 0 6b Motor Leg - Right: 0 7 Limb Ataxia: 0 8 Sensory: 0 9 Best Language: 0 10 Dysarthria: 0 11 Extinct. and Inatten.: 0 TOTAL: 0   Labs I have reviewed labs in epic and the results pertinent to this  consultation are:   CBC    Component Value Date/Time   WBC 15.9 (H) 06/11/2023 0935   RBC 4.86 06/11/2023 0935   HGB 12.9 (L) 06/11/2023 0935   HCT 39.5 06/11/2023 0935   PLT 279 06/11/2023 0935   MCV 81.3 06/11/2023 0935   MCH 26.5 06/11/2023 0935   MCHC 32.7 06/11/2023 0935   RDW 14.1 06/11/2023 0935   LYMPHSABS 1.0 06/06/2023 1622   MONOABS 0.9 06/06/2023 1622   EOSABS 0.1 06/06/2023 1622   BASOSABS 0.1 06/06/2023 1622    CMP     Component Value Date/Time   NA 136 06/11/2023 0935   K 3.6 06/11/2023 0935   CL 106 06/11/2023 0935   CO2 16 (L) 06/11/2023 0935   GLUCOSE 228 (H) 06/11/2023 0935   BUN 25 (H) 06/11/2023 0935   CREATININE 1.06 06/11/2023 0935   CALCIUM 9.2 06/11/2023 0935   PROT 7.2 06/06/2023 1622   ALBUMIN 3.6 06/06/2023 1622   AST 14 (L) 06/06/2023 1622   ALT 15 06/06/2023 1622   ALKPHOS 99 06/06/2023 1622   BILITOT 0.6 06/06/2023 1622   GFRNONAA >60 06/11/2023 0935   GFRAA >60 06/25/2020 1819    Lipid Panel     Component Value Date/Time   CHOL 108 01/18/2015 0652   TRIG 100 01/18/2015 0652   HDL 23 (L) 01/18/2015 0652   CHOLHDL 4.7 01/18/2015 0652   VLDL 20 01/18/2015 0652   LDLCALC 65 01/18/2015 0652   A1c 11.1  Imaging I have reviewed the images obtained:  MRI examination of the brain: Acute perforator infarct in right centrum semiovale  Assessment: 72 year old patient with history of renal and pancreatic transplant, diabetes, hypertension, left BKA, sleep apnea and chronic right foot wound was initially admitted with osteomyelitis of the right foot and has been receiving appropriate treatment for this.  During hospitalization, he developed headaches, dizziness and dysphagia and was about to undergo an EGD when he had significantly elevated blood pressure with diaphoresis.  EGD was canceled, and patient underwent brain MRI, which demonstrated acute perforator infarct in right centrum semiovale.  Patient also has had some visual deficits but  can sense motion in right and left visual fields.  He also states that he has been dropping objects with his left hand.  His stroke is very small and likely does not explain his deficits and his most likely an incidental finding.  He will still need  full stroke workup.  Impression: Acute ischemic stroke, likely due to small vessel disease in patient with multiple risk factors, not sure if it has any bearing on current presentation and is likely incidental.  Recommendations: Stroke/TIA Workup  - Permissive HTN x48 hrs goal BP <220/110. PRN labetalol or hydralazine if BP above these parameters. Avoid oral antihypertensives. - MRA head with carotid ultrasound - TTE w/ bubble - Check A1c and LDL + add statin per guidelines -Aspirin 81 mg daily  - q4 hr neuro checks - STAT head CT for any change in neuro exam - Tele - PT/OT/SLP - Stroke education - Amb referral to neurology upon discharge    Pt seen by NP/Neuro and later by MD. Note/plan to be edited by MD as needed.   Cortney E Ernestina Columbia , MSN, AGACNP-BC [facility] See Amion for schedule and pager information 06/11/2023 2:22 PM    Attending Neurohospitalist Addendum Patient seen and examined with APP/Resident. Agree with the history and physical as documented above. Agree with the plan as documented, which I helped formulate. I have independently reviewed the chart, obtained history, review of systems and examined the patient.I have personally reviewed pertinent head/neck/spine imaging (CT/MRI). Please feel free to call with any questions.  -- Milon Dikes, MD Neurologist [facility] Pager: [phone]

## 2023-06-11 NOTE — Progress Notes (Signed)
In the preprocedure area, patient was diaphoretic, chills but no fever, tachycardic with a heart rate 110-120, hypertensive blood pressure 190/80. CBG 92 Denied any specific pain  Procedure canceled by anesthesia, patient will be sent back to the floor to be assessed and managed by the primary team  Will reassess patient on Monday to see if we can proceed with EGD for further management of dysphagia symptoms and esophageal stricture  GI is available over the weekend, please call with any questions  K. Scherry Ran , MD [phone]

## 2023-06-11 NOTE — Anesthesia Preprocedure Evaluation (Signed)
Anesthesia Evaluation    Reviewed: Allergy & Precautions, Patient's Chart, lab work & pertinent test results  Airway        Dental   Pulmonary sleep apnea and Continuous Positive Airway Pressure Ventilation , former smoker          Cardiovascular hypertension, Pt. on medications and Pt. on home beta blockers + Peripheral Vascular Disease       Neuro/Psych  PSYCHIATRIC DISORDERS Anxiety      Neuromuscular disease    GI/Hepatic Neg liver ROS,GERD  Medicated,,  Endo/Other  diabetes, Insulin Dependent, Oral Hypoglycemic Agents    Renal/GU negative Renal ROS     Musculoskeletal negative musculoskeletal ROS (+)    Abdominal   Peds  Hematology negative hematology ROS (+)   Anesthesia Other Findings Dysphagia Nausea  Reproductive/Obstetrics                             Anesthesia Physical Anesthesia Plan  ASA:   Anesthesia Plan: MAC   Post-op Pain Management:    Induction:   PONV Risk Score and Plan:   Airway Management Planned:   Additional Equipment:   Intra-op Plan:   Post-operative Plan:   Informed Consent:   Plan Discussed with:   Anesthesia Plan Comments:        Anesthesia Quick Evaluation

## 2023-06-11 NOTE — Progress Notes (Signed)
Echocardiogram 2D Echocardiogram has been performed.  Lucendia Herrlich 06/11/2023, 6:45 PM

## 2023-06-11 NOTE — Progress Notes (Signed)
PROGRESS NOTE    [name].  BJY:[id number] DOB: 07-03-1951 DOA: 06/06/2023 PCP: [name], MD     Brief Narrative:  72 year old with history of renal/pancreatic transplant at [facility], DM 2, HTN, left leg BKA, OSA, chronic right foot wound woke up on the day of admission with chills.  In the ED he was started on broad-spectrum antibiotics and admitted due to concerns of possible foot infection, Dr. Lajoyce Corners consulted.  MRI of the foot was consistent with osteomyelitis.  ABI performed showing PVD, Dr Lajoyce Corners following, due to persistent nausea, angio postponed.  Prior to performing EGD, patient was noted to have significantly elevated blood pressure with diaphoresis therefore he was canceled.  Workup revealed patient has acute CVA, neurology consulted.   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  MRI foot is consistent with osteomyelitis.  ABI showing PVD.  Dr. Lajoyce Corners & Vasc following.  LE Angio postponed in the setting of dysphagia, nausea  Recurrent SIRS -Tachycardia, diaphoresis and hypertensive.  Wonder if this is related to relative hypoglycemia versus worsening of infection vs uncontrolled BP.  Blood cultures ordered, amp D50 given.  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  Consulted neurology, permissive hypertension.  A1c elevated.  Will check lipid panel  Dysphagia - Has prior history of peptic stricture requiring dilation.  Was planned EGD today but had to be postponed due to his ongoing symptoms as mentioned above.   History of pancreatic/renal transplant, 2008 -On CellCept and tacrolimus.  Follows mostly at the [facility] with Dr. Arrie Aran    Diabetes mellitus type 2 History of gastroparesis.  Adjusting insulin as necessary   Essential hypertension, uncontrolled - Will slowly resume his home meds   Hyperlipidemia - Statin   Obstructive sleep apnea -Follow-up outpatient  Transfer to [facility].      DVT prophylaxis: Lovenox Code Status: Full code Family Communication:  Status is: Inpatient Remains inpatient appropriate because:Multiple issues, traansfer to [facility]             Subjective: This morning while patient was about to get his EGD he was noted to be diaphoretic, tachycardic and hypotensive therefore it was canceled by anesthesia.  When patient returned to the floor he reported that he was having right sided visual disturbances since yesterday and he is almost blind in his left eye.  His systolic blood pressure was greater than 200. During my evaluation he did not have any focal neurodeficit but did have stat MRI of the brain which showed acute CVA.  After lowering his blood pressure he felt significantly better   Examination:  General exam: Appears calm and comfortable  Respiratory system: Clear to auscultation. Respiratory effort normal. Cardiovascular system: S1 & S2 heard, RRR. No JVD, murmurs, rubs, gallops or clicks. No pedal edema. Gastrointestinal system: Abdomen is nondistended, soft and nontender. No organomegaly or masses felt. Normal bowel sounds heard. Central nervous system: Alert and oriented. No focal neurological deficits. Extremities: Symmetric 5 x 5 power. Skin: Right foot plantar surface ulcer noted Psychiatry: Judgement and insight appear normal. Mood & affect appropriate.       Diet Orders (From admission, onward)     Start  Ordered   06/11/23 1115  Diet full liquid Room service appropriate? Yes; Fluid consistency: Thin  Diet effective now       Question Answer Comment  Room service appropriate? Yes   Fluid consistency: Thin      06/11/23 1114             Objective: Vitals:   06/11/23 0849 06/11/23 0905 06/11/23 1100 06/11/23 1231  BP:  (!) 182/72 (!) 207/84 (!) 129/58  Pulse:  (!) 109 100 85  Resp:   15 20  Temp: 99.6 F (37.6 C)  97.7 F (36.5 C) 98.4 F (36.9 C)  TempSrc: Rectal  Oral Oral  SpO2:   100% 99%  Weight:      Height:        Intake/Output Summary (Last 24 hours) at 06/11/2023 1319 Last data filed at 06/11/2023 0400 Gross per 24 hour  Intake 0 ml  Output 325 ml  Net -325 ml   Filed Weights   06/06/23 1522 06/11/23 0755  Weight: 72.6 kg 72.6 kg    Scheduled Meds:  ascorbic acid  250 mg Oral Q M,W,F   aspirin EC  81 mg Oral Daily   atorvastatin  40 mg Oral QHS   enoxaparin (LOVENOX) injection  40 mg Subcutaneous Q24H   furosemide  20 mg Oral Daily   insulin aspart  0-15 Units Subcutaneous TID WC   insulin glargine-yfgn  16 Units Subcutaneous Daily   leptospermum manuka honey  1 Application Topical Daily   losartan  50 mg Oral Daily   mycophenolate  1,000 mg Oral BID   pantoprazole (PROTONIX) IV  40 mg Intravenous Q12H   potassium chloride SA  20 mEq Oral Daily   tacrolimus  5 mg Oral BID   Vitamin D (Ergocalciferol)  50,000 Units Oral Q Wed   Continuous Infusions:  sodium chloride 75 mL/hr at 06/11/23 1130   ceFEPime (MAXIPIME) IV 2 g (06/11/23 1123)   dextrose 5 % and 0.45 % NaCl 75 mL/hr at 06/11/23 0856   vancomycin 1,500 mg (06/10/23 1249)    Nutritional status     Body mass index is 22.97 kg/m.  Data Reviewed:   CBC: Recent Labs  Lab 06/06/23 1622 06/07/23 0527 06/08/23 0219 06/09/23 0242 06/10/23 0347 06/11/23 0935  WBC 13.9* 11.3* 12.9* 16.3* 15.8* 15.9*  NEUTROABS 11.6*  --   --   --   --   --   HGB 11.5* 10.5* 10.4* 11.6* 13.1 12.9*  HCT 36.4* 32.9* 32.7* 36.9* 39.5 39.5  MCV 81.8 81.2 80.5 80.0 79.6* 81.3  PLT 201 242 218 251 237 279   Basic Metabolic Panel: Recent Labs  Lab 06/07/23 0645 06/08/23 0219 06/09/23 0242 06/10/23 0347 06/11/23 0935  NA 134* 138 136  135 136  K 4.8 3.9 4.0 3.6 3.6  CL 102 107 103 103 106  CO2 21* 22 18* 19* 16*  GLUCOSE 278* 192* 226* 124* 228*  BUN 21 15 12 17  25*  CREATININE 1.20 1.11 0.93 0.93 1.06  CALCIUM 8.7* 9.0 9.0 9.0 9.2  MG 1.9 1.8 1.5* 2.2 2.0   GFR: Estimated Creatinine Clearance: 66.6 mL/min (by C-G formula based on SCr of 1.06 mg/dL). Liver Function Tests: Recent Labs  Lab 06/06/23 1622  AST 14*  ALT 15  ALKPHOS 99  BILITOT 0.6  PROT 7.2  ALBUMIN 3.6   No results for input(s): "LIPASE", "AMYLASE" in the last 168 hours. No results for input(s): "AMMONIA" in the last 168 hours.  Coagulation Profile: No results for input(s): "INR", "PROTIME" in the last 168 hours. Cardiac Enzymes: No results for input(s): "CKTOTAL", "CKMB", "CKMBINDEX", "TROPONINI" in the last 168 hours. BNP (last 3 results) No results for input(s): "PROBNP" in the last 8760 hours. HbA1C: No results for input(s): "HGBA1C" in the last 72 hours. CBG: Recent Labs  Lab 06/11/23 0437 06/11/23 0751 06/11/23 0838 06/11/23 0908 06/11/23 1234  GLUCAP 105* 91 99 224* 187*   Lipid Profile: No results for input(s): "CHOL", "HDL", "LDLCALC", "TRIG", "CHOLHDL", "LDLDIRECT" in the last 72 hours. Thyroid Function Tests: No results for input(s): "TSH", "T4TOTAL", "FREET4", "T3FREE", "THYROIDAB" in the last 72 hours. Anemia Panel: No results for input(s): "VITAMINB12", "FOLATE", "FERRITIN", "TIBC", "IRON", "RETICCTPCT" in the last 72 hours. Sepsis Labs: Recent Labs  Lab 06/07/23 1712 06/11/23 0935  PROCALCITON <0.10  --   LATICACIDVEN  --  1.2    Recent Results (from the past 240 hour(s))  SARS Coronavirus 2 by RT PCR (hospital order, performed in [facility] hospital lab) *cepheid single result test* Anterior Nasal Swab     Status: None   Collection Time: 06/06/23  3:47 PM   Specimen: Anterior Nasal Swab  Result Value Ref Range Status   SARS Coronavirus 2 by RT PCR NEGATIVE NEGATIVE Final    Comment: Performed at [facility], [address] [place], [place] [address]  Blood culture (routine x 2)     Status: None   Collection Time: 06/06/23  6:15 PM   Specimen: BLOOD LEFT ARM  Result Value Ref Range Status   Specimen Description BLOOD LEFT ARM  Final   Special Requests   Final    BOTTLES DRAWN AEROBIC AND ANAEROBIC Blood Culture adequate volume   Culture   Final    NO GROWTH 5 DAYS Performed at [facility], [address] [place], [place] [address]    Report Status 06/11/2023 FINAL  Final  Blood culture (routine x 2)     Status: None   Collection Time: 06/06/23  6:15 PM   Specimen: BLOOD LEFT HAND  Result Value Ref Range Status   Specimen Description BLOOD LEFT HAND  Final   Special Requests   Final    BOTTLES DRAWN AEROBIC AND ANAEROBIC Blood Culture adequate volume   Culture   Final    NO GROWTH 5 DAYS Performed at [facility], [address], [place], [place] [address]    Report Status 06/11/2023 FINAL  Final  Urine Culture     Status: None   Collection Time: 06/06/23  6:34 PM   Specimen: Urine, Clean Catch  Result Value Ref Range Status   Specimen Description URINE, CLEAN CATCH  Final   Special Requests NONE  Final   Culture   Final    NO GROWTH Performed at [facility], [address] [place], [place] [address]    Report Status 06/07/2023 FINAL  Final  Surgical PCR screen     Status: None   Collection Time: 06/09/23  3:59 AM   Specimen: Nasal Mucosa; Nasal Swab  Result Value Ref Range Status   MRSA, PCR NEGATIVE NEGATIVE Final   Staphylococcus aureus NEGATIVE NEGATIVE Final    Comment: (NOTE) The Xpert SA Assay (FDA approved for NASAL specimens in patients 4 years of age and older), is one component of a comprehensive surveillance program. It is not intended to diagnose infection nor to guide or monitor treatment. Performed at [facility], [address] [place], [place] [address]          Radiology Studies: MR [place] WO  CONTRAST  Result Date: 06/11/2023 CLINICAL DATA:  Ophthalmoplegia visual disturbance; visual disturbance. EXAM: MRI HEAD AND ORBITS WITHOUT AND WITH CONTRAST TECHNIQUE: Multiplanar, multiecho pulse sequences of the brain and surrounding structures were obtained without and with intravenous contrast. Multiplanar, multiecho pulse sequences of the orbits and surrounding structures were obtained including fat saturation techniques, before and after intravenous contrast administration. CONTRAST:  7.20mL GADAVIST GADOBUTROL 1 MMOL/ML IV SOLN COMPARISON:  Head CT 02/22/2021. FINDINGS: MRI HEAD FINDINGS Brain: Punctate focus of acute infarction in the right frontal centrum semiovale. No acute hemorrhage or significant mass effect. Stable background of mild chronic small-vessel disease. No hydrocephalus or extra-axial collection. No mass or abnormal enhancement. No foci of abnormal susceptibility. Vascular: Normal flow voids and vessel enhancement. Skull and upper cervical spine: Normal marrow signal and enhancement. Other: None. MRI ORBITS FINDINGS Orbits: No traumatic or inflammatory finding. Globes, optic nerves, orbital fat, extraocular muscles, vascular structures, and lacrimal glands are normal. Visualized sinuses: Clear. Soft tissues: Unremarkable. IMPRESSION: 1. Acute perforator infarct in the right frontal centrum semiovale. No acute hemorrhage or significant mass effect. 2. Normal MRI of the orbits. Electronically Signed   By: Orvan Falconer M.D.   On: 06/11/2023 12:52   MR BRAIN W WO CONTRAST  Result Date: 06/11/2023 CLINICAL DATA:  Ophthalmoplegia visual disturbance; visual disturbance. EXAM: MRI HEAD AND ORBITS WITHOUT AND WITH CONTRAST TECHNIQUE: Multiplanar, multiecho pulse sequences of the brain and surrounding structures were obtained without and with intravenous contrast. Multiplanar, multiecho pulse sequences of the orbits and surrounding structures were obtained including fat saturation techniques,  before and after intravenous contrast administration. CONTRAST:  7.52mL GADAVIST GADOBUTROL 1 MMOL/ML IV SOLN COMPARISON:  Head CT 02/22/2021. FINDINGS: MRI HEAD FINDINGS Brain: Punctate focus of acute infarction in the right frontal centrum semiovale. No acute hemorrhage or significant mass effect. Stable background of mild chronic small-vessel disease. No hydrocephalus or extra-axial collection. No mass or abnormal enhancement. No foci of abnormal susceptibility. Vascular: Normal flow voids and vessel enhancement. Skull and upper cervical spine: Normal marrow signal and enhancement. Other: None. MRI ORBITS FINDINGS Orbits: No traumatic or inflammatory finding. Globes, optic nerves, orbital fat, extraocular muscles, vascular structures, and lacrimal glands are normal. Visualized sinuses: Clear. Soft tissues: Unremarkable. IMPRESSION: 1. Acute perforator infarct in the right frontal centrum semiovale. No acute hemorrhage or significant mass effect. 2. Normal MRI of the orbits. Electronically Signed   By: Orvan Falconer M.D.   On: 06/11/2023 12:52   DG ESOPHAGUS W SINGLE CM (SOL OR THIN BA)  Result Date: 06/10/2023 CLINICAL DATA:  72 year old male with complaint of nausea EXAM: ESOPHAGUS/BARIUM SWALLOW/TABLET STUDY TECHNIQUE: Single contrast examination was performed using Omnipaque 300. This exam was performed by Loyce Dys PA-C, and was supervised and interpreted by Acquanetta Belling, MD. FLUOROSCOPY: Radiation Exposure Index (as provided by the fluoroscopic device): 59.4 mGy Kerma COMPARISON:  None Available. FINDINGS: Swallowing: Appears normal. No vestibular penetration or aspiration seen. Pharynx: Unremarkable. Esophagus: Normal appearance. No masses, lesions, or ulcers observed. No stricture or areas of narrowing. Esophageal motility: Tertiary contractions noted with poor primary stripping wave leading to incomplete emptying of contrast bolus with stasis. Esophagus does not fully empty despite multiple  attempts to clear and additional swallows. Hiatal Hernia: None. Gastroesophageal reflux: None visualized. Ingested 13 mm barium tablet: Became stuck near the lower esophagus and did not pass. Other: Limited  evaluation due to nausea. Patient was able to consume contrast, however eventually vomited. Contrast enters the small bowel near the end of exam, but note possible delayed gastric emptying. IMPRESSION: Tertiary contractions and poor progression of contrast leading to severe dysmotility characterized by incomplete esophageal emptying and inability to pass 13 mm tablet. Electronically Signed   By: Acquanetta Belling M.D.   On: 06/10/2023 12:59           LOS: 5 days   Time spent= 35 mins    Miguel Rota, MD [facility]  If 7PM-7AM, please contact night-coverage  06/11/2023, 1:19 PM

## 2023-06-11 NOTE — Progress Notes (Signed)
Pt to endocospy, per bed.

## 2023-06-11 NOTE — Progress Notes (Signed)
Pt's BP is 187/84 HR 115, RR is 18, 100 room air. Pt is diaphoretic and cold and clammy, stating he does not feel good.   Rapid response team and hospitalist on call notified. PRN hydralazine given as ordered.   Will recheck BP and continue to monitor.  New orders received.

## 2023-06-11 NOTE — Progress Notes (Signed)
MEWS Progress Note  Patient Details Name: [name]. MRN: [id number] DOB: [birth year] Today's Date: 06/11/2023   MEWS Flowsheet Documentation:  Assess: MEWS Score Temp: 97.7 F (36.5 C) BP: (!) 207/84 MAP (mmHg): 121 Pulse Rate: 100 ECG Heart Rate: 100 Resp: 15 Level of Consciousness: Alert SpO2: 100 % O2 Device: Room Air Patient Activity (if Appropriate): In bed Assess: MEWS Score MEWS Temp: 0 MEWS Systolic: 2 MEWS Pulse: 0 MEWS RR: 0 MEWS LOC: 0 MEWS Score: 2 MEWS Score Color: Yellow Assess: SIRS CRITERIA SIRS Temperature : 0 SIRS Respirations : 0 SIRS Pulse: 1 SIRS WBC: 1 SIRS Score Sum : 2 Assess: if the MEWS score is Yellow or Red Were vital signs accurate and taken at a resting state?: Yes Does the patient meet 2 or more of the SIRS criteria?: Yes Does the patient have a confirmed or suspected source of infection?: Yes MEWS guidelines implemented : Yes, yellow Treat MEWS Interventions: Considered administering scheduled or prn medications/treatments as ordered Take Vital Signs Increase Vital Sign Frequency : Yellow: Q2hr x1, continue Q4hrs until patient remains green for 12hrs Escalate MEWS: Escalate: Yellow: Discuss with charge nurse and consider notifying provider and/or RRT Notify: Charge Nurse/RN Name of Charge Nurse/RN Notified: Museum/gallery curator Provider Notification Provider Name/Title: Dr. Nelson Chimes Date Provider Notified: 06/11/23 Time Provider Notified: 1100 Method of Notification: Page, Face-to-face Notification Reason: Other (Comment) (elevated BP) Provider response: See new orders, At bedside Date of Provider Response: 06/11/23 Time of Provider Response: 1100      [name] 06/11/2023, 11:37 AM

## 2023-06-11 NOTE — Significant Event (Signed)
Rapid Response Event Note   Reason for Call :  Called about pt diaphoretic and hypertensive. RN states he just came back from attempted EGD which was not performed d/t pt clinical status. Pt returned to room and c/o not feeling well.  Initial Focused Assessment:  On arrival, pt sitting in bed with eyes closed. Able to open eyes and respond to questions appropriately. Pt extremely diaphoretic as well as nauseous.   Vitals: 168/78, HR 108, RR 26, spO2 99% on RA, rectal temp 99.6  Pt c/o blurry vision and pain to Bilat upper legs while in the room which is new. He then began vomiting.    Interventions:  CBG- 99 Md notified and ordered the following:  -D50 -IVF- D5 1/2NS @75  - Labs: VBG, Blood Cultures, Lactic - MRI head -Toradol -Transfer to PCU  PRN Metoprolol and Zofran given   Plan of Care:  Continue to monitor pt status. Awaiting lab results and MRI. RN instructed to call with any changes or concerns.    Event Summary:   MD Notified: Dr Nelson Chimes notified Call Time: 0830 Arrival Time: 0836 End Time: 0930  Mordecai Rasmussen, RN

## 2023-06-12 ENCOUNTER — Other Ambulatory Visit (HOSPITAL_COMMUNITY): Payer: Medicare HMO

## 2023-06-12 ENCOUNTER — Inpatient Hospital Stay (HOSPITAL_COMMUNITY): Payer: Medicare HMO

## 2023-06-12 ENCOUNTER — Encounter (HOSPITAL_COMMUNITY): Payer: Medicare HMO

## 2023-06-12 DIAGNOSIS — M869 Osteomyelitis, unspecified: Secondary | ICD-10-CM | POA: Diagnosis not present

## 2023-06-12 DIAGNOSIS — I639 Cerebral infarction, unspecified: Secondary | ICD-10-CM | POA: Diagnosis not present

## 2023-06-12 DIAGNOSIS — Z940 Kidney transplant status: Secondary | ICD-10-CM | POA: Diagnosis not present

## 2023-06-12 DIAGNOSIS — Z89519 Acquired absence of unspecified leg below knee: Secondary | ICD-10-CM

## 2023-06-12 DIAGNOSIS — E11649 Type 2 diabetes mellitus with hypoglycemia without coma: Secondary | ICD-10-CM

## 2023-06-12 DIAGNOSIS — G4733 Obstructive sleep apnea (adult) (pediatric): Secondary | ICD-10-CM | POA: Diagnosis not present

## 2023-06-12 DIAGNOSIS — I739 Peripheral vascular disease, unspecified: Secondary | ICD-10-CM | POA: Diagnosis not present

## 2023-06-12 LAB — ECHOCARDIOGRAM COMPLETE
AR max vel: 2.02 cm2
AV Peak grad: 14 mmHg
Ao pk vel: 1.87 m/s
Area-P 1/2: 3.21 cm2
Height: 70 in
S' Lateral: 2.5 cm
Weight: 2560.86 [oz_av]

## 2023-06-12 LAB — BASIC METABOLIC PANEL
Anion gap: 11 (ref 5–15)
BUN: 26 mg/dL — ABNORMAL HIGH (ref 8–23)
CO2: 19 mmol/L — ABNORMAL LOW (ref 22–32)
Calcium: 9.2 mg/dL (ref 8.9–10.3)
Chloride: 104 mmol/L (ref 98–111)
Creatinine, Ser: 1.19 mg/dL (ref 0.61–1.24)
GFR, Estimated: >60 mL/min (ref >60)
Glucose, Bld: 93 mg/dL (ref 70–99)
Potassium: 3.2 mmol/L — ABNORMAL LOW (ref 3.5–5.1)
Sodium: 134 mmol/L — ABNORMAL LOW (ref 135–145)

## 2023-06-12 LAB — CBC
HCT: 36.3 % — ABNORMAL LOW (ref 39.0–52.0)
Hemoglobin: 12 g/dL — ABNORMAL LOW (ref 13.0–17.0)
MCH: 26.6 pg (ref 26.0–34.0)
MCHC: 33.1 g/dL (ref 30.0–36.0)
MCV: 80.5 fL (ref 80.0–100.0)
Platelets: 260 10*3/uL (ref 150–400)
RBC: 4.51 MIL/uL (ref 4.22–5.81)
RDW: 14.3 % (ref 11.5–15.5)
WBC: 13.2 10*3/uL — ABNORMAL HIGH (ref 4.0–10.5)
nRBC: 0 % (ref 0.0–0.2)

## 2023-06-12 LAB — GLUCOSE, CAPILLARY
Glucose-Capillary: 82 mg/dL (ref 70–99)
Glucose-Capillary: 97 mg/dL (ref 70–99)
Glucose-Capillary: 143 mg/dL — ABNORMAL HIGH (ref 70–99)
Glucose-Capillary: 167 mg/dL — ABNORMAL HIGH (ref 70–99)

## 2023-06-12 LAB — LIPID PANEL
Cholesterol: 130 mg/dL (ref 0–200)
HDL: 23 mg/dL — ABNORMAL LOW (ref >40)
LDL Cholesterol: 76 mg/dL (ref 0–99)
Total CHOL/HDL Ratio: 5.7 ratio
Triglycerides: 155 mg/dL — ABNORMAL HIGH (ref <150)
VLDL: 31 mg/dL (ref 0–40)

## 2023-06-12 LAB — VANCOMYCIN, TROUGH: Vancomycin Tr: 12 ug/mL — ABNORMAL LOW (ref 15–20)

## 2023-06-12 LAB — MAGNESIUM: Magnesium: 2.1 mg/dL (ref 1.7–2.4)

## 2023-06-12 MED ORDER — INSULIN ASPART 100 UNIT/ML IJ SOLN
0.0000 [IU] | Freq: Three times a day (TID) | INTRAMUSCULAR | Status: DC
  Administered 2023-06-14 (×2): 2 [IU] via SUBCUTANEOUS
  Administered 2023-06-14: 3 [IU] via SUBCUTANEOUS
  Administered 2023-06-15: 1 [IU] via SUBCUTANEOUS
  Administered 2023-06-16: 2 [IU] via SUBCUTANEOUS
  Administered 2023-06-17: 1 [IU] via SUBCUTANEOUS
  Administered 2023-06-17: 3 [IU] via SUBCUTANEOUS
  Administered 2023-06-18: 1 [IU] via SUBCUTANEOUS
  Administered 2023-06-18: 2 [IU] via SUBCUTANEOUS
  Administered 2023-06-19: 1 [IU] via SUBCUTANEOUS
  Administered 2023-06-19: 2 [IU] via SUBCUTANEOUS
  Administered 2023-06-19 – 2023-06-22 (×3): 1 [IU] via SUBCUTANEOUS
  Administered 2023-06-22: 2 [IU] via SUBCUTANEOUS
  Administered 2023-06-24: 3 [IU] via SUBCUTANEOUS
  Administered 2023-06-25 (×2): 1 [IU] via SUBCUTANEOUS
  Administered 2023-06-27: 3 [IU] via SUBCUTANEOUS
  Administered 2023-06-28 – 2023-06-29 (×2): 1 [IU] via SUBCUTANEOUS

## 2023-06-12 MED ORDER — ATORVASTATIN CALCIUM 80 MG PO TABS
80.0000 mg | ORAL_TABLET | Freq: Every day | ORAL | Status: DC
  Administered 2023-06-12 – 2023-06-28 (×15): 80 mg via ORAL
  Filled 2023-06-12 (×17): qty 1

## 2023-06-12 MED ORDER — VANCOMYCIN HCL IN DEXTROSE 1-5 GM/200ML-% IV SOLN
1000.0000 mg | Freq: Two times a day (BID) | INTRAVENOUS | Status: DC
  Administered 2023-06-12 – 2023-06-16 (×8): 1000 mg via INTRAVENOUS
  Filled 2023-06-12 (×8): qty 200

## 2023-06-12 MED ORDER — POTASSIUM CHLORIDE 10 MEQ/100ML IV SOLN
10.0000 meq | INTRAVENOUS | Status: AC
  Administered 2023-06-12 (×4): 10 meq via INTRAVENOUS
  Filled 2023-06-12 (×4): qty 100

## 2023-06-12 NOTE — Evaluation (Signed)
Physical Therapy Evaluation Patient Details Name: [name]. MRN: [id number] DOB: November 04, 1950 Today's Date: 06/12/2023  History of Present Illness  Pt is 72 yo male who presents on 06/06/23 with generalized weakness and fatigue with tremors and chills. Has a wound under R great toe and plans for 1st ray amputation. 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 PMH: kidney transplant, DM, GERD, HTN, PTSD, PVD, L eye blindness, sleep apnea, thyroid disease, L BKA  Clinical Impression  Pt admitted with above diagnosis. Pt received by PT/OT in bed, reports he had blurry vision R eye yesterday but that it has improved significantly. Pt AxOx4 and quite verbal when in bed. Pt came to sitting without assist and reported mild dizziness which subsided with time up. He donned L prosthesis proficiently and stood to RW with min A +2 but light headedness returned and did not abate. Pt cued to turn to recliner to sit which he did with min A +2 but continued to feel poorly and with decreased verbalization and a continued feeling of light headedness. HR 104 bpm at rest with BP 158/74. RN notified of symptoms. At this point recommending HHPT but pt has not yet had great toe amputation so PT will follow and update rec accordingly.  Pt currently with functional limitations due to the deficits listed below (see PT Problem List). Pt will benefit from acute skilled PT to increase their independence and safety with mobility to allow discharge.           If plan is discharge home, recommend the following: A little help with walking and/or transfers;A little help with bathing/dressing/bathroom;Help with stairs or ramp for entrance;Assistance with cooking/housework;Assist for transportation   Can travel by private vehicle        Equipment Recommendations None recommended by PT  Recommendations for Other Services       Functional Status Assessment Patient has had a recent decline in their  functional status and demonstrates the ability to make significant improvements in function in a reasonable and predictable amount of time.     Precautions / Restrictions Precautions Precautions: Fall;Other (comment) Precaution Comments: watch HR Required Braces or Orthoses: Other Brace Other Brace: L prosthesis Restrictions Weight Bearing Restrictions: No      Mobility  Bed Mobility Overal bed mobility: Needs Assistance Bed Mobility: Supine to Sit     Supine to sit: Supervision     General bed mobility comments: pt able to come to EOB with increased time, no physical assist    Transfers Overall transfer level: Needs assistance Equipment used: Rolling walker (2 wheels) Transfers: Sit to/from Stand, Bed to chair/wheelchair/BSC Sit to Stand: Min assist, +2 safety/equipment   Step pivot transfers: Min assist, +2 safety/equipment       General transfer comment: pt stands to RW and reports increased light headedness the longer he was up. Was initially planning to help him set up for bathing but pivoted to chair and sat due to not feeling well. SPO2 elevated into low 100's with mobility and remained in this range at rest. BP 158/74    Ambulation/Gait               General Gait Details: deferred due to pt symptoms  Stairs            Wheelchair Mobility     Tilt Bed    Modified Rankin (Stroke Patients Only) Modified Rankin (Stroke Patients Only) Pre-Morbid Rankin Score: Slight disability Modified Rankin:  Moderately severe disability     Balance Overall balance assessment: Needs assistance Sitting-balance support: Feet supported, No upper extremity supported Sitting balance-Leahy Scale: Good     Standing balance support: During functional activity, Bilateral upper extremity supported Standing balance-Leahy Scale: Poor Standing balance comment: reliant on B UE support today due to light headedness                             Pertinent  Vitals/Pain Pain Assessment Pain Assessment: No/denies pain    Home Living Family/patient expects to be discharged to:: Private residence Living Arrangements: Spouse/significant other;Other relatives Available Help at Discharge: Family;Available PRN/intermittently Type of Home: House Home Access: Stairs to enter   Entrance Stairs-Number of Steps: 1   Home Layout: One level Home Equipment: Cane - single point;Shower seat;Grab bars - toilet;Grab bars - tub/shower;BSC/3in1;Standard Environmental consultant      Prior Function Prior Level of Function : Independent/Modified Independent             Mobility Comments: wears prosthesis everyday, ambulates with SPC ADLs Comments: independent     Extremity/Trunk Assessment   Upper Extremity Assessment Upper Extremity Assessment: Defer to OT evaluation    Lower Extremity Assessment Lower Extremity Assessment: RLE deficits/detail;LLE deficits/detail RLE Deficits / Details: strength WFL, noted wound plantar surface great toe, no pain RLE Sensation: decreased light touch;history of peripheral neuropathy RLE Coordination: WNL LLE Deficits / Details: hip and knee WFL, pt dons L prosthesis proficiently for transfers and gait LLE Sensation: WNL LLE Coordination: WNL    Cervical / Trunk Assessment Cervical / Trunk Assessment: Normal  Communication   Communication Communication: No apparent difficulties Cueing Techniques: Verbal cues  Cognition Arousal: Alert, Lethargic Behavior During Therapy: WFL for tasks assessed/performed Overall Cognitive Status: Within Functional Limits for tasks assessed                                 General Comments: pt began session alert and very verbal however once sitting up EOB pt began to c/o light headedness which abated and then returned with standing transfer and remained. At that point, pt lethargic and not verbalizing frequently. RN notified        General Comments General comments (skin  integrity, edema, etc.): pt eyelids "fluttering" when closed and pt seated in chair. HR 104 bpm when PT/OT left room    Exercises     Assessment/Plan    PT Assessment Patient needs continued PT services  PT Problem List Decreased balance;Decreased mobility;Cardiopulmonary status limiting activity       PT Treatment Interventions DME instruction;Gait training;Stair training;Functional mobility training;Therapeutic activities;Therapeutic exercise;Balance training;Patient/family education    PT Goals (Current goals can be found in the Care Plan section)  Acute Rehab PT Goals Patient Stated Goal: return home PT Goal Formulation: With patient Time For Goal Achievement: 06/26/23 Potential to Achieve Goals: Good    Frequency Min 1X/week     Co-evaluation PT/OT/SLP Co-Evaluation/Treatment: Yes Reason for Co-Treatment: Complexity of the patient's impairments (multi-system involvement);For patient/therapist safety PT goals addressed during session: Mobility/safety with mobility;Balance;Proper use of DME         AM-PAC PT "6 Clicks" Mobility  Outcome Measure Help needed turning from your back to your side while in a flat bed without using bedrails?: None Help needed moving from lying on your back to sitting on the side of a flat bed without using bedrails?: None  Help needed moving to and from a bed to a chair (including a wheelchair)?: None Help needed standing up from a chair using your arms (e.g., wheelchair or bedside chair)?: A Little Help needed to walk in hospital room?: A Lot Help needed climbing 3-5 steps with a railing? : Total 6 Click Score: 18    End of Session Equipment Utilized During Treatment: Gait belt Activity Tolerance: Treatment limited secondary to medical complications (Comment) Patient left: in chair;with call bell/phone within reach;with chair alarm set Nurse Communication: Mobility status;Other (comment) (HR and symptoms) PT Visit Diagnosis: Dizziness and  giddiness (R42)    Time: 9528-4132 PT Time Calculation (min) (ACUTE ONLY): 33 min   Charges:   PT Evaluation $PT Eval Moderate Complexity: 1 Mod   PT General Charges $$ ACUTE PT VISIT: 1 Visit         Lyanne Co, PT  Acute Rehab Services Secure chat preferred Office [phone]   Lawana Chambers Randen Kauth 06/12/2023, 11:29 AM

## 2023-06-12 NOTE — Progress Notes (Signed)
Pt assisted back to bed from recliner where he has been sitting most of the day.  Two assist with walker.  Pt became diaphoretic, weak, dizzy.  HR 100s, BP 195/86.  NIH scale unchanged from earlier today.

## 2023-06-12 NOTE — Progress Notes (Addendum)
STROKE TEAM PROGRESS NOTE   BRIEF HPI [name]. is a 72 y.o. male with history of renal and pancreatic transplant, diabetes, hypertension, left BKA, sleep apnea and right foot wound who was admitted with osteomyelitis of the right foot.  During admission, patient developed headaches, dizziness and dysphagia which prompted EGD.  Prior to EGD, patient had significantly elevated blood pressure and diaphoresis.  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  Patient also has had some visual difficulties, and he has been dropping objects with his left hand.    However, stroke is very small and does not correlate with most of his deficits.    SIGNIFICANT HOSPITAL EVENTS  9/13: EGD cancelled by anesthesia d/t tachycardia, hypertension  INTERIM HISTORY/SUBJECTIVE Stroke believed to be enthesis incidental finding as it is very small and likely does not explain deficits.  Stroke workup continuing   OBJECTIVE  CBC    Component Value Date/Time   WBC 13.2 (H) 06/12/2023 0336   RBC 4.51 06/12/2023 0336   HGB 12.0 (L) 06/12/2023 0336   HCT 36.3 (L) 06/12/2023 0336   PLT 260 06/12/2023 0336   MCV 80.5 06/12/2023 0336   MCH 26.6 06/12/2023 0336   MCHC 33.1 06/12/2023 0336   RDW 14.3 06/12/2023 0336   LYMPHSABS 1.0 06/06/2023 1622   MONOABS 0.9 06/06/2023 1622   EOSABS 0.1 06/06/2023 1622   BASOSABS 0.1 06/06/2023 1622    BMET    Component Value Date/Time   NA 134 (L) 06/12/2023 0336   K 3.2 (L) 06/12/2023 0336   CL 104 06/12/2023 0336   CO2 19 (L) 06/12/2023 0336   GLUCOSE 93 06/12/2023 0336   BUN 26 (H) 06/12/2023 0336   CREATININE 1.19 06/12/2023 0336   CALCIUM 9.2 06/12/2023 0336   GFRNONAA >60 06/12/2023 0336    IMAGING past 24 hours ECHOCARDIOGRAM COMPLETE  Result Date: 06/12/2023    ECHOCARDIOGRAM REPORT   Patient Name:   [name]. Date of Exam: 06/11/2023 Medical Rec #:  [id number]          Height:       70.0 in  Accession #:    [id number]         Weight:       160.1 lb Date of Birth:  [birth year]          BSA:          1.899 m Patient Age:    72 years           BP:           129/58 mmHg Patient Gender: M                  HR:           86 bpm. Exam Location:  Inpatient Procedure: 2D Echo, Cardiac Doppler and Color Doppler Indications:    Stroke I63.9  History:        Patient has prior history of Echocardiogram examinations, most                 recent 07/26/2017. PAD; Risk Factors:Diabetes, Hypertension,                 Sleep Apnea and Dyslipidemia.  Sonographer:    Lucendia Herrlich Referring Phys: [id number] CORTNEY E DE LA TORRE IMPRESSIONS  1. Intracavitary gradient at rest: 10 mmHg, with Valsalva 24 mmHg. Left ventricular ejection fraction, by estimation, is 70 to 75%. The left ventricle  has hyperdynamic function. The left ventricle has no regional wall motion abnormalities. There is mild  left ventricular hypertrophy. Left ventricular diastolic parameters are consistent with Grade I diastolic dysfunction (impaired relaxation).  2. Right ventricular systolic function is normal. The right ventricular size is normal. Tricuspid regurgitation signal is inadequate for assessing PA pressure.  3. A small pericardial effusion is present. There is no evidence of cardiac tamponade.  4. The mitral valve is normal in structure. Trivial mitral valve regurgitation. No evidence of mitral stenosis.  5. The aortic valve is grossly normal. There is mild calcification of the aortic valve. Aortic valve regurgitation is not visualized. No aortic stenosis is present.  6. The inferior vena cava is normal in size with greater than 50% respiratory variability, suggesting right atrial pressure of 3 mmHg. FINDINGS  Left Ventricle: Intracavitary gradient at rest: 10 mmHg, with Valsalva 24 mmHg. Left ventricular ejection fraction, by estimation, is 70 to 75%. The left ventricle has hyperdynamic function. The left ventricle has no regional wall motion  abnormalities. The left ventricular internal cavity size was normal in size. There is mild left ventricular hypertrophy. Left ventricular diastolic parameters are consistent with Grade I diastolic dysfunction (impaired relaxation). Right Ventricle: The right ventricular size is normal. No increase in right ventricular wall thickness. Right ventricular systolic function is normal. Tricuspid regurgitation signal is inadequate for assessing PA pressure. The tricuspid regurgitant velocity is 1.52 m/s, and with an assumed right atrial pressure of 3 mmHg, the estimated right ventricular systolic pressure is 12.2 mmHg. Left Atrium: Left atrial size was normal in size. Right Atrium: Right atrial size was normal in size. Pericardium: A small pericardial effusion is present. There is no evidence of cardiac tamponade. Mitral Valve: The mitral valve is normal in structure. Trivial mitral valve regurgitation. No evidence of mitral valve stenosis. Tricuspid Valve: The tricuspid valve is normal in structure. Tricuspid valve regurgitation is trivial. No evidence of tricuspid stenosis. Aortic Valve: The aortic valve is grossly normal. There is mild calcification of the aortic valve. Aortic valve regurgitation is not visualized. No aortic stenosis is present. Aortic valve peak gradient measures 14.0 mmHg. Pulmonic Valve: The pulmonic valve was normal in structure. Pulmonic valve regurgitation is not visualized. No evidence of pulmonic stenosis. Aorta: The aortic root is normal in size and structure. Venous: The inferior vena cava is normal in size with greater than 50% respiratory variability, suggesting right atrial pressure of 3 mmHg. IAS/Shunts: No atrial level shunt detected by color flow Doppler.  LEFT VENTRICLE PLAX 2D LVIDd:         4.50 cm   Diastology LVIDs:         2.50 cm   LV e' medial:    8.24 cm/s LV PW:         1.20 cm   LV E/e' medial:  8.6 LV IVS:        1.20 cm   LV e' lateral:   8.39 cm/s LVOT diam:     2.00 cm    LV E/e' lateral: 8.5 LV SV:         68 LV SV Index:   36 LVOT Area:     3.14 cm  RIGHT VENTRICLE             IVC RV S prime:     17.60 cm/s  IVC diam: 1.80 cm TAPSE (M-mode): 2.2 cm LEFT ATRIUM             Index  RIGHT ATRIUM           Index LA diam:        4.00 cm 2.11 cm/m   RA Area:     13.30 cm LA Vol (A2C):   49.2 ml 25.91 ml/m  RA Volume:   27.30 ml  14.38 ml/m LA Vol (A4C):   46.7 ml 24.60 ml/m LA Biplane Vol: 45.1 ml 23.75 ml/m  AORTIC VALVE AV Area (Vmax): 2.02 cm AV Vmax:        187.00 cm/s AV Peak Grad:   14.0 mmHg LVOT Vmax:      120.00 cm/s LVOT Vmean:     80.700 cm/s LVOT VTI:       0.215 m  AORTA Ao Root diam: 3.60 cm Ao Asc diam:  3.40 cm MITRAL VALVE               TRICUSPID VALVE MV Area (PHT): 3.21 cm    TR Peak grad:   9.2 mmHg MV Decel Time: 236 msec    TR Vmax:        152.00 cm/s MV E velocity: 71.10 cm/s MV A velocity: 96.00 cm/s  SHUNTS MV E/A ratio:  0.74        Systemic VTI:  0.22 m                            Systemic Diam: 2.00 cm Weston Brass MD Electronically signed by Weston Brass MD Signature Date/Time: 06/12/2023/7:02:51 AM    Final    MR ORBITS W WO CONTRAST  Result Date: 06/11/2023 CLINICAL DATA:  Ophthalmoplegia visual disturbance; visual disturbance. EXAM: MRI HEAD AND ORBITS WITHOUT AND WITH CONTRAST TECHNIQUE: Multiplanar, multiecho pulse sequences of the brain and surrounding structures were obtained without and with intravenous contrast. Multiplanar, multiecho pulse sequences of the orbits and surrounding structures were obtained including fat saturation techniques, before and after intravenous contrast administration. CONTRAST:  7.69mL GADAVIST GADOBUTROL 1 MMOL/ML IV SOLN COMPARISON:  Head CT 02/22/2021. FINDINGS: MRI HEAD FINDINGS Brain: Punctate focus of acute infarction in the right frontal centrum semiovale. No acute hemorrhage or significant mass effect. Stable background of mild chronic small-vessel disease. No hydrocephalus or extra-axial  collection. No mass or abnormal enhancement. No foci of abnormal susceptibility. Vascular: Normal flow voids and vessel enhancement. Skull and upper cervical spine: Normal marrow signal and enhancement. Other: None. MRI ORBITS FINDINGS Orbits: No traumatic or inflammatory finding. Globes, optic nerves, orbital fat, extraocular muscles, vascular structures, and lacrimal glands are normal. Visualized sinuses: Clear. Soft tissues: Unremarkable. IMPRESSION: 1. Acute perforator infarct in the right frontal centrum semiovale. No acute hemorrhage or significant mass effect. 2. Normal MRI of the orbits. Electronically Signed   By: Orvan Falconer M.D.   On: 06/11/2023 12:52   MR BRAIN W WO CONTRAST  Result Date: 06/11/2023 CLINICAL DATA:  Ophthalmoplegia visual disturbance; visual disturbance. EXAM: MRI HEAD AND ORBITS WITHOUT AND WITH CONTRAST TECHNIQUE: Multiplanar, multiecho pulse sequences of the brain and surrounding structures were obtained without and with intravenous contrast. Multiplanar, multiecho pulse sequences of the orbits and surrounding structures were obtained including fat saturation techniques, before and after intravenous contrast administration. CONTRAST:  7.53mL GADAVIST GADOBUTROL 1 MMOL/ML IV SOLN COMPARISON:  Head CT 02/22/2021. FINDINGS: MRI HEAD FINDINGS Brain: Punctate focus of acute infarction in the right frontal centrum semiovale. No acute hemorrhage or significant mass effect. Stable background of mild chronic small-vessel disease. No hydrocephalus or extra-axial collection. No mass  or abnormal enhancement. No foci of abnormal susceptibility. Vascular: Normal flow voids and vessel enhancement. Skull and upper cervical spine: Normal marrow signal and enhancement. Other: None. MRI ORBITS FINDINGS Orbits: No traumatic or inflammatory finding. Globes, optic nerves, orbital fat, extraocular muscles, vascular structures, and lacrimal glands are normal. Visualized sinuses: Clear. Soft tissues:  Unremarkable. IMPRESSION: 1. Acute perforator infarct in the right frontal centrum semiovale. No acute hemorrhage or significant mass effect. 2. Normal MRI of the orbits. Electronically Signed   By: Orvan Falconer M.D.   On: 06/11/2023 12:52    Vitals:   06/11/23 1231 06/11/23 1619 06/11/23 1921 06/11/23 2243  BP: (!) 129/58 131/63 (!) 123/58 (!) 124/58  Pulse: 85 78 79 82  Resp: 20 20 16 18   Temp: 98.4 F (36.9 C) 98.3 F (36.8 C) 98.3 F (36.8 C) 98.4 F (36.9 C)  TempSrc: Oral Oral Oral Oral  SpO2: 99% 99% 100% 100%  Weight:      Height:         PHYSICAL EXAM General:  Alert, well-nourished, well-developed patient in no acute distress Psych:  Mood and affect appropriate for situation CV: Regular rate and rhythm on monitor Respiratory:  Regular, unlabored respirations on room air GI: Abdomen soft and nontender   NEURO:  Mental Status: AA&Ox3, patient is able to give clear and coherent history Speech/Language: speech is without dysarthria or aphasia.  Naming, repetition, fluency, and comprehension intact.  Cranial Nerves:  II: PERRL.  Blind in left eye at baseline.  Additional visual deficits in right visual fields but is able to see motion. III, IV, VI: EOMI. Eyelids elevate symmetrically.  V: Sensation is intact to light touch and symmetrical to face.  VII: Face is symmetrical resting and smiling VIII: hearing intact to voice. IX, X: Palate elevates symmetrically. Phonation is normal.  NG:[id number] shrug 5/5. XII: tongue is midline without fasciculations. Motor: 5/5 strength to all muscle groups tested, except left grip which is 4-/5 Tone: is normal and bulk is normal Sensation- Intact to light touch bilaterally. Extinction absent to light touch to DSS.   Coordination: FTN intact bilaterally, HKS: no ataxia in BLE.No drift.  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 MRA  PENDING Carotid Doppler  PENDING 2D Echo: LVEF 70-75%  LDL 76 HgbA1c 11.1 VTE prophylaxis - lovenox aspirin 81 mg daily prior to admission, now on aspirin 81 mg daily.  Pending clearance from GI and vascular surgery/procedures to start DAPT for 3 weeks then followed by aspirin alone. Therapy recommendations:  Home Health PT Disposition:  pending  Hypertension Home meds: Amlodipine 10 mg, Lasix 20 mg, labetalol 100 mg twice daily, lisinopril 10 mg, losartan 50 mg--on hold.  On cozaar now Gradually normalize BP in 2-3 days Long term BP goal normotensive  Hyperlipidemia Home meds: Atorvastatin 40 mg LDL 76, goal < 70 Increase to Lipitor 80 mg Continue statin at discharge  Diabetes type II Uncontrolled Home meds: Insulin, metformin 500 mg HgbA1c 11.1, goal < 7.0 CBGs SSI On insulin Recommend close follow-up with PCP for better DM control  PVD Vascular on board Right foot osteomyelitis Left BKA LE arteriogram scheduled next week On cefepime and vanco On ASA now  Dysphagia Patient has post-stroke dysphagia, SLP consulted    Diet   Diet full liquid  Room service appropriate? 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 Note/plan to be edited by MD as needed.    Lynnae January, DNP, AGACNP-BC [facility] Please use AMION for contact information & EPIC for messaging.  ATTENDING NOTE: I reviewed above note and agree with the assessment and plan. Pt was seen and examined.   No family at the bedside. Pt lying in bed, AAO x3, paucity of speech but  no aphasia, following all simple commands. Able to name and repeat. Poor vision with left eye only see hand waving, and right eye can see finger counting with tunnel vision, loss of peripheral vision. No gaze palsy, b/l gaze complete. No significant facial asymmetry. BUE 4/5 and BLE 3/5. Sensation symmetrical subjectively. B/l FTN slow but no ataxia. Gait not tested.   Pt stroke consistent with lacunar infarct likely due to small vessel disease given the multiple risk factors. Currently pending further work up including EGD and LE angiogram with potential right foot treatment. Will continue ASA 81 at this time. Once no more procedure planned, may consider DAPT for 3 weeks and then ASA alone. Increase lipitor from 40 to 80. Pending MRA and CUS. Will follow.  For detailed assessment and plan, please refer to above/below as I have made changes wherever appropriate.   Marvel Plan, MD PhD Stroke Neurology 06/12/2023 4:14 PM    To contact Stroke Continuity provider, please refer to [email or web address]. After hours, contact General Neurology

## 2023-06-12 NOTE — Progress Notes (Signed)
Pharmacy Antibiotic Note- follow up  [name]. is a 72 y.o. male admitted on 06/06/2023 with right foot ulcer with osteomyelitis.  Pharmacy has been consulted for cefepime and vancomycin dosing.  Plans for R 1st wray amputation but procedures are delayed. Vancomycin trough is 12, no peak ordered d/t limited lab staff. Goal trough for osteo is typically 15-20, therefore will increase vancomycin frequency and adjust dose accordingly.  Plan: Change vancomycin to 1000mg  IV Q12H Continue Cefepime 2G IV q8 hours Follow Scr closely as it is slowly trending upwards F/u clinically progression, cultures, and abx plans  Height: 5\' 10"  (177.8 cm) Weight: 72.6 kg (160 lb 0.9 oz) IBW/kg (Calculated) : 73  Temp (24hrs), Avg:98.1 F (36.7 C), Min:97.3 F (36.3 C), Max:98.4 F (36.9 C)  Recent Labs  Lab 06/08/23 0219 06/09/23 0242 06/10/23 0347 06/11/23 0935 06/11/23 1258 06/11/23 1910 06/12/23 0336 06/12/23 1336  WBC 12.9* 16.3* 15.8* 15.9*  --   --  13.2*  --   CREATININE 1.11 0.93 0.93 1.06  --   --  1.19  --   LATICACIDVEN  --   --   --  1.2 2.3* 1.5  --   --   VANCOTROUGH  --   --   --   --   --   --   --  12*    Estimated Creatinine Clearance: 59.3 mL/min (by C-G formula based on SCr of 1.19 mg/dL).    No Known Allergies   Microbiology results: 9/13 Bcx ngtd <24hrs 9/8 BCx: neg 9/8 UCx: neg  Thank you for allowing pharmacy to be a part of this patient's care.  Nicole Kindred, PharmD PGY1 Pharmacy Resident 06/12/2023 2:43 PM

## 2023-06-12 NOTE — Evaluation (Signed)
Occupational Therapy Evaluation Patient Details Name: [name]. MRN: [id number] DOB: 1951/06/26 Today's Date: 06/12/2023   History of Present Illness Pt is 72 yo male who presents on 06/06/23 with generalized weakness and fatigue with tremors and chills. Has a wound under R great toe and plans for 1st ray amputation. 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 PMH: kidney transplant, DM, GERD, HTN, PTSD, PVD, L eye blindness, sleep apnea, thyroid disease, L BKA   Clinical Impression   Pt presents with decline in function and safety with ADLs and ADL mobility with impaired activity tolerance, balance and strength; pt reports he had blurry vision R eye yesterday but that it has improved significantly. PTA pt lives at home with his wife and mother in law and was Ind with ADLs/selfcare and uses cane and has L LE prosthesis. Pt currently requires min A for LB ADLs and mobility using RW. Initially, pt alert and quite verbal. Pt came to sitting EOB without assist and reported mild dizziness which subsided. He donned L prosthesis proficiently and stood to RW with min A +2 but light headedness returned and did not subside. Pt cued to turn to recliner to sit which he did with min A +2 but continued to feel poorly and with decreased verbalization and a continued feeling of light headedness. HR 104 bpm at rest with BP 158/74. RN notified of symptoms. Recommending HH OT but pt has not yet had great toe amputation and OT will follow up to re assess as needed      If plan is discharge home, recommend the following: A little help with bathing/dressing/bathroom;A little help with walking and/or transfers    Functional Status Assessment  Patient has had a recent decline in their functional status and demonstrates the ability to make significant improvements in function in a reasonable and predictable amount of time.  Equipment Recommendations  None recommended by OT    Recommendations  for Other Services       Precautions / Restrictions Precautions Precautions: Fall;Other (comment) Precaution Comments: watch HR Required Braces or Orthoses: Other Brace Other Brace: L prosthesis Restrictions Weight Bearing Restrictions: No      Mobility Bed Mobility Overal bed mobility: Needs Assistance Bed Mobility: Supine to Sit     Supine to sit: Supervision     General bed mobility comments: pt able to come to EOB with increased time, no physical assist    Transfers Overall transfer level: Needs assistance Equipment used: Rolling walker (2 wheels) Transfers: Sit to/from Stand, Bed to chair/wheelchair/BSC Sit to Stand: Min assist, +2 safety/equipment     Step pivot transfers: Min assist, +2 safety/equipment     General transfer comment: pt stands to RW and reports increased light headedness the longer he was up. Was initially planning to help him set up for bathing but pivoted to chair and sat due to not feeling well. SPO2 elevated into low 100's with mobility and remained in this range at rest. BP 158/74      Balance Overall balance assessment: Needs assistance Sitting-balance support: Feet supported, No upper extremity supported Sitting balance-Leahy Scale: Good     Standing balance support: During functional activity, Bilateral upper extremity supported Standing balance-Leahy Scale: Poor Standing balance comment: reliant on B UE support today due to light headedness                           ADL either  performed or assessed with clinical judgement   ADL Overall ADL's : Needs assistance/impaired Eating/Feeding: Independent   Grooming: Wash/dry hands;Wash/dry face;Set up;Sitting   Upper Body Bathing: Set up;Supervision/ safety;Sitting Upper Body Bathing Details (indicate cue type and reason): simulated Lower Body Bathing: Minimal assistance Lower Body Bathing Details (indicate cue type and reason): simulated Upper Body Dressing : Set  up;Supervision/safety;Sitting   Lower Body Dressing: Minimal assistance   Toilet Transfer: Minimal assistance;+2 for safety/equipment;Rolling walker (2 wheels) Toilet Transfer Details (indicate cue type and reason): simulated to chair Toileting- Clothing Manipulation and Hygiene: Minimal assistance;Sit to/from stand       Functional mobility during ADLs: Minimal assistance;+2 for safety/equipment;Rolling walker (2 wheels)       Vision Baseline Vision/History:  (hx of L eye blindness) Patient Visual Report: Blurring of vision (pt reporst R eye blurred vision has significantly improved)       Perception         Praxis         Pertinent Vitals/Pain Pain Assessment Pain Assessment: No/denies pain     Extremity/Trunk Assessment Upper Extremity Assessment Upper Extremity Assessment: Generalized weakness   Lower Extremity Assessment Lower Extremity Assessment: Defer to PT evaluation RLE Deficits / Details: strength WFL, noted wound plantar surface great toe, no pain RLE Sensation: decreased light touch;history of peripheral neuropathy RLE Coordination: WNL LLE Deficits / Details: hip and knee WFL, pt dons L prosthesis proficiently for transfers and gait LLE Sensation: WNL LLE Coordination: WNL   Cervical / Trunk Assessment Cervical / Trunk Assessment: Normal   Communication Communication Communication: No apparent difficulties Cueing Techniques: Verbal cues   Cognition Arousal: Alert, Lethargic Behavior During Therapy: WFL for tasks assessed/performed Overall Cognitive Status: Within Functional Limits for tasks assessed                                 General Comments: pt began session alert and very verbal however once sitting up EOB pt began to c/o light headedness which subsided and then returned with standing transfer,  pt became lethargic and not verbalizing as much. RN notified     General Comments  pt eyelids "fluttering" when closed and pt  seated in chair. HR 104 bpm when PT/OT left room    Exercises     Shoulder Instructions      Home Living Family/patient expects to be discharged to:: Private residence Living Arrangements: Spouse/significant other;Other relatives Available Help at Discharge: Family;Available PRN/intermittently Type of Home: House Home Access: Stairs to enter [employer] of Steps: 1   Home Layout: One level     Bathroom Shower/Tub: Chief Strategy Officer: Standard     Home Equipment: Cane - single point;Shower seat;Grab bars - toilet;Grab bars - tub/shower;BSC/3in1;Standard Walker          Prior Functioning/Environment Prior Level of Function : Independent/Modified Independent             Mobility Comments: wears prosthesis everyday, ambulates with SPC ADLs Comments: Ind        OT Problem List: Impaired balance (sitting and/or standing);Decreased activity tolerance;Decreased knowledge of use of DME or AE      OT Treatment/Interventions: Self-care/ADL training;DME and/or AE instruction;Therapeutic activities;Therapeutic exercise;Patient/family education    OT Goals(Current goals can be found in the care plan section) Acute Rehab OT Goals Patient Stated Goal: go home OT Goal Formulation: With patient Time For Goal Achievement: 06/26/23 Potential to Achieve Goals: Good  ADL Goals Pt Will Perform Grooming: with set-up;sitting Pt Will Perform Upper Body Bathing: with set-up;sitting Pt Will Perform Lower Body Bathing: with contact guard assist;with supervision Pt Will Perform Upper Body Dressing: with set-up;sitting Pt Will Perform Lower Body Dressing: with contact guard assist;with supervision Pt Will Transfer to Toilet: with min assist;with contact guard assist Pt Will Perform Toileting - Clothing Manipulation and hygiene: with contact guard assist;with supervision  OT Frequency: Min 2X/week    Co-evaluation PT/OT/SLP Co-Evaluation/Treatment: Yes Reason  for Co-Treatment: Complexity of the patient's impairments (multi-system involvement);For patient/therapist safety PT goals addressed during session: Mobility/safety with mobility;Balance;Proper use of DME OT goals addressed during session: ADL's and self-care;Proper use of Adaptive equipment and DME      AM-PAC OT "6 Clicks" Daily Activity     Outcome Measure Help from another person eating meals?: None Help from another person taking care of personal grooming?: A Little Help from another person toileting, which includes using toliet, bedpan, or urinal?: A Little Help from another person bathing (including washing, rinsing, drying)?: A Little Help from another person to put on and taking off regular upper body clothing?: A Little Help from another person to put on and taking off regular lower body clothing?: A Little 6 Click Score: 19   End of Session Equipment Utilized During Treatment: Gait belt;Rolling walker (2 wheels) Nurse Communication: Mobility status  Activity Tolerance: Patient limited by lethargy Patient left: in chair;with call bell/phone within reach;with chair alarm set  OT Visit Diagnosis: Muscle weakness (generalized) (M62.81);Dizziness and giddiness (R42)                Time: 6063-0160 OT Time Calculation (min): 33 min Charges:  OT General Charges $OT Visit: 1 Visit OT Evaluation $OT Eval Moderate Complexity: 1 Mod   Galen Manila 06/12/2023, 12:50 PM

## 2023-06-12 NOTE — Progress Notes (Signed)
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 Scherry Ran , MD [phone]

## 2023-06-12 NOTE — Progress Notes (Signed)
PROGRESS NOTE  [name]. ZDG:[id number] DOB: 1950/12/25   PCP: [name], MD  Patient is from: Home.  Lives with his wife.  DOA: 06/06/2023 LOS: 6  Chief complaints Chief Complaint  Patient presents with   Weakness     Brief Narrative / Interim history: 72 year old M with PMH of renal/pancreatic transplant at [facility], DM-2 with retinopathy, left BKA and chronic right foot wound, HTN, OSA, left eye blindness and poor vision in right eye presented to ED after he woke up with chills, and admitted for sepsis due to acute on chronic right foot ulcer with osteomyelitis.  MRI of his fluid was consistent with osteomyelitis.  ABI performed and showed PVD.  Orthopedic surgery, Dr. Lajoyce Corners and vascular surgery consulted.  He could not undergo angiography due to nausea and dysphagia.  GI was consulted and was to perform EGD which was also canceled due to elevated BP and diaphoresis.  He had acute right eye blurry vision.  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  Neurology consulted.   Patient remains on IV cefepime and vancomycin for right foot osteomyelitis.   Subjective: Seen and examined earlier this morning.  No major events overnight of this morning.  No complaints other than intermittent blurry vision in right eye.  He says he has poor vision in right eye and no vision in left eye.  He reports using glasses at home.  Also reports getting eye injection likely from retinopathy.  Objective: Vitals:   06/11/23 2243 06/12/23 0942 06/12/23 1102 06/12/23 1112  BP: (!) 124/58 (!) 199/79 (!) 158/74   Pulse: 82  100   Resp: 18 17 20    Temp: 98.4 F (36.9 C) (!) 97.3 F (36.3 C) 98.4 F (36.9 C)   TempSrc: Oral Oral Oral Oral  SpO2: 100% 100% 99%   Weight:      Height:        Examination:  GENERAL: No apparent distress.  Nontoxic. HEENT: MMM.  Barely see from right eye.  Completely blind from left eye. NECK: Supple.  No  apparent JVD.  RESP:  No IWOB.  Fair aeration bilaterally. CVS:  RRR. Heart sounds normal.  ABD/GI/GU: BS+. Abd soft, NTND.  MSK/EXT: Significant muscle mass and subcu fat loss.  Ulcer at the base of right great toe.  Not able to palpate DP pulse.  Left BKA. SKIN: As above. NEURO: Awake, alert and oriented appropriately.  PERRL.  No facial asymmetry.  Barely seen from right eye.  Blind in left eye.  No apparent focal neuro deficit. PSYCH: Calm. Normal affect.   Procedures:  None  Microbiology summarized: 9/8-urine culture NGTD 9/8-blood culture NGTD 9/11-MRSA PCR screen negative 9/13-blood cultures negative  Assessment and plan: Principal Problem:   Osteomyelitis of great toe of right foot (HCC) Active Problems:   PAD (peripheral artery disease) (HCC)   S/P BKA (below knee amputation) unilateral (HCC)   OSA on CPAP   History of simultaneous kidney and pancreas transplant (HCC)  Sepsis due to acute on chronic right foot ulcer with osteomyelitis/PAD-MRI positive for osteomyelitis.  ABI with PVD. LE Angio postponed in the setting of dysphagia and nausea which seems to have resolved now. -Continue IV cefepime and vancomycin -Orthopedic surgery and vascular surgery following -Continue aspirin and statin.   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  I do not think this is related to his right eye blurry  PROGRESS NOTE  [name]. ZDG:[id number] DOB: 1950/12/25   PCP: [name], MD  Patient is from: Home.  Lives with his wife.  DOA: 06/06/2023 LOS: 6  Chief complaints Chief Complaint  Patient presents with   Weakness     Brief Narrative / Interim history: 72 year old M with PMH of renal/pancreatic transplant at [facility], DM-2 with retinopathy, left BKA and chronic right foot wound, HTN, OSA, left eye blindness and poor vision in right eye presented to ED after he woke up with chills, and admitted for sepsis due to acute on chronic right foot ulcer with osteomyelitis.  MRI of his fluid was consistent with osteomyelitis.  ABI performed and showed PVD.  Orthopedic surgery, Dr. Lajoyce Corners and vascular surgery consulted.  He could not undergo angiography due to nausea and dysphagia.  GI was consulted and was to perform EGD which was also canceled due to elevated BP and diaphoresis.  He had acute right eye blurry vision.  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  Neurology consulted.   Patient remains on IV cefepime and vancomycin for right foot osteomyelitis.   Subjective: Seen and examined earlier this morning.  No major events overnight of this morning.  No complaints other than intermittent blurry vision in right eye.  He says he has poor vision in right eye and no vision in left eye.  He reports using glasses at home.  Also reports getting eye injection likely from retinopathy.  Objective: Vitals:   06/11/23 2243 06/12/23 0942 06/12/23 1102 06/12/23 1112  BP: (!) 124/58 (!) 199/79 (!) 158/74   Pulse: 82  100   Resp: 18 17 20    Temp: 98.4 F (36.9 C) (!) 97.3 F (36.3 C) 98.4 F (36.9 C)   TempSrc: Oral Oral Oral Oral  SpO2: 100% 100% 99%   Weight:      Height:        Examination:  GENERAL: No apparent distress.  Nontoxic. HEENT: MMM.  Barely see from right eye.  Completely blind from left eye. NECK: Supple.  No  apparent JVD.  RESP:  No IWOB.  Fair aeration bilaterally. CVS:  RRR. Heart sounds normal.  ABD/GI/GU: BS+. Abd soft, NTND.  MSK/EXT: Significant muscle mass and subcu fat loss.  Ulcer at the base of right great toe.  Not able to palpate DP pulse.  Left BKA. SKIN: As above. NEURO: Awake, alert and oriented appropriately.  PERRL.  No facial asymmetry.  Barely seen from right eye.  Blind in left eye.  No apparent focal neuro deficit. PSYCH: Calm. Normal affect.   Procedures:  None  Microbiology summarized: 9/8-urine culture NGTD 9/8-blood culture NGTD 9/11-MRSA PCR screen negative 9/13-blood cultures negative  Assessment and plan: Principal Problem:   Osteomyelitis of great toe of right foot (HCC) Active Problems:   PAD (peripheral artery disease) (HCC)   S/P BKA (below knee amputation) unilateral (HCC)   OSA on CPAP   History of simultaneous kidney and pancreas transplant (HCC)  Sepsis due to acute on chronic right foot ulcer with osteomyelitis/PAD-MRI positive for osteomyelitis.  ABI with PVD. LE Angio postponed in the setting of dysphagia and nausea which seems to have resolved now. -Continue IV cefepime and vancomycin -Orthopedic surgery and vascular surgery following -Continue aspirin and statin.   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  I do not think this is related to his right eye blurry  PROGRESS NOTE  [name]. ZDG:[id number] DOB: 1950/12/25   PCP: [name], MD  Patient is from: Home.  Lives with his wife.  DOA: 06/06/2023 LOS: 6  Chief complaints Chief Complaint  Patient presents with   Weakness     Brief Narrative / Interim history: 72 year old M with PMH of renal/pancreatic transplant at [facility], DM-2 with retinopathy, left BKA and chronic right foot wound, HTN, OSA, left eye blindness and poor vision in right eye presented to ED after he woke up with chills, and admitted for sepsis due to acute on chronic right foot ulcer with osteomyelitis.  MRI of his fluid was consistent with osteomyelitis.  ABI performed and showed PVD.  Orthopedic surgery, Dr. Lajoyce Corners and vascular surgery consulted.  He could not undergo angiography due to nausea and dysphagia.  GI was consulted and was to perform EGD which was also canceled due to elevated BP and diaphoresis.  He had acute right eye blurry vision.  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  Neurology consulted.   Patient remains on IV cefepime and vancomycin for right foot osteomyelitis.   Subjective: Seen and examined earlier this morning.  No major events overnight of this morning.  No complaints other than intermittent blurry vision in right eye.  He says he has poor vision in right eye and no vision in left eye.  He reports using glasses at home.  Also reports getting eye injection likely from retinopathy.  Objective: Vitals:   06/11/23 2243 06/12/23 0942 06/12/23 1102 06/12/23 1112  BP: (!) 124/58 (!) 199/79 (!) 158/74   Pulse: 82  100   Resp: 18 17 20    Temp: 98.4 F (36.9 C) (!) 97.3 F (36.3 C) 98.4 F (36.9 C)   TempSrc: Oral Oral Oral Oral  SpO2: 100% 100% 99%   Weight:      Height:        Examination:  GENERAL: No apparent distress.  Nontoxic. HEENT: MMM.  Barely see from right eye.  Completely blind from left eye. NECK: Supple.  No  apparent JVD.  RESP:  No IWOB.  Fair aeration bilaterally. CVS:  RRR. Heart sounds normal.  ABD/GI/GU: BS+. Abd soft, NTND.  MSK/EXT: Significant muscle mass and subcu fat loss.  Ulcer at the base of right great toe.  Not able to palpate DP pulse.  Left BKA. SKIN: As above. NEURO: Awake, alert and oriented appropriately.  PERRL.  No facial asymmetry.  Barely seen from right eye.  Blind in left eye.  No apparent focal neuro deficit. PSYCH: Calm. Normal affect.   Procedures:  None  Microbiology summarized: 9/8-urine culture NGTD 9/8-blood culture NGTD 9/11-MRSA PCR screen negative 9/13-blood cultures negative  Assessment and plan: Principal Problem:   Osteomyelitis of great toe of right foot (HCC) Active Problems:   PAD (peripheral artery disease) (HCC)   S/P BKA (below knee amputation) unilateral (HCC)   OSA on CPAP   History of simultaneous kidney and pancreas transplant (HCC)  Sepsis due to acute on chronic right foot ulcer with osteomyelitis/PAD-MRI positive for osteomyelitis.  ABI with PVD. LE Angio postponed in the setting of dysphagia and nausea which seems to have resolved now. -Continue IV cefepime and vancomycin -Orthopedic surgery and vascular surgery following -Continue aspirin and statin.   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  I do not think this is related to his right eye blurry  [address] [place], [place] [address]    Report Status PENDING  Incomplete    Radiology Studies: ECHOCARDIOGRAM COMPLETE  Result Date: 06/12/2023    ECHOCARDIOGRAM REPORT   Patient Name:   [name]. Date of Exam: 06/11/2023 Medical Rec #:  [id number]          Height:       70.0 in Accession #:    [id number]         Weight:       160.1 lb Date of Birth:  October 01, 1950          BSA:          1.899 m Patient Age:    72 years           BP:           129/58 mmHg Patient Gender: M                  HR:           86 bpm. Exam Location:  Inpatient Procedure: 2D Echo, Cardiac Doppler and Color Doppler Indications:    Stroke I63.9  History:        Patient has prior history of Echocardiogram examinations, most                 recent 07/26/2017. PAD; Risk  Factors:Diabetes, Hypertension,                 Sleep Apnea and Dyslipidemia.  Sonographer:    Lucendia Herrlich Referring Phys: [id number] CORTNEY E DE LA TORRE IMPRESSIONS  1. Intracavitary gradient at rest: 10 mmHg, with Valsalva 24 mmHg. Left ventricular ejection fraction, by estimation, is 70 to 75%. The left ventricle has hyperdynamic function. The left ventricle has no regional wall motion abnormalities. There is mild  left ventricular hypertrophy. Left ventricular diastolic parameters are consistent with Grade I diastolic dysfunction (impaired relaxation).  2. Right ventricular systolic function is normal. The right ventricular size is normal. Tricuspid regurgitation signal is inadequate for assessing PA pressure.  3. A small pericardial effusion is present. There is no evidence of cardiac tamponade.  4. The mitral valve is normal in structure. Trivial mitral valve regurgitation. No evidence of mitral stenosis.  5. The aortic valve is grossly normal. There is mild calcification of the aortic valve. Aortic valve regurgitation is not visualized. No aortic stenosis is present.  6. The inferior vena cava is normal in size with greater than 50% respiratory variability, suggesting right atrial pressure of 3 mmHg. FINDINGS  Left Ventricle: Intracavitary gradient at rest: 10 mmHg, with Valsalva 24 mmHg. Left ventricular ejection fraction, by estimation, is 70 to 75%. The left ventricle has hyperdynamic function. The left ventricle has no regional wall motion abnormalities. The left ventricular internal cavity size was normal in size. There is mild left ventricular hypertrophy. Left ventricular diastolic parameters are consistent with Grade I diastolic dysfunction (impaired relaxation). Right Ventricle: The right ventricular size is normal. No increase in right ventricular wall thickness. Right ventricular systolic function is normal. Tricuspid regurgitation signal is inadequate for assessing PA pressure. The tricuspid  regurgitant velocity is 1.52 m/s, and with an assumed right atrial pressure of 3 mmHg, the estimated right ventricular systolic pressure is 12.2 mmHg. Left Atrium: Left atrial size was normal in size. Right Atrium: Right atrial size was normal in size. Pericardium: A small pericardial effusion is present. There is no evidence of cardiac tamponade. Mitral Valve: The mitral valve is normal in  [address] [place], [place] [address]    Report Status PENDING  Incomplete    Radiology Studies: ECHOCARDIOGRAM COMPLETE  Result Date: 06/12/2023    ECHOCARDIOGRAM REPORT   Patient Name:   [name]. Date of Exam: 06/11/2023 Medical Rec #:  [id number]          Height:       70.0 in Accession #:    [id number]         Weight:       160.1 lb Date of Birth:  October 01, 1950          BSA:          1.899 m Patient Age:    72 years           BP:           129/58 mmHg Patient Gender: M                  HR:           86 bpm. Exam Location:  Inpatient Procedure: 2D Echo, Cardiac Doppler and Color Doppler Indications:    Stroke I63.9  History:        Patient has prior history of Echocardiogram examinations, most                 recent 07/26/2017. PAD; Risk  Factors:Diabetes, Hypertension,                 Sleep Apnea and Dyslipidemia.  Sonographer:    Lucendia Herrlich Referring Phys: [id number] CORTNEY E DE LA TORRE IMPRESSIONS  1. Intracavitary gradient at rest: 10 mmHg, with Valsalva 24 mmHg. Left ventricular ejection fraction, by estimation, is 70 to 75%. The left ventricle has hyperdynamic function. The left ventricle has no regional wall motion abnormalities. There is mild  left ventricular hypertrophy. Left ventricular diastolic parameters are consistent with Grade I diastolic dysfunction (impaired relaxation).  2. Right ventricular systolic function is normal. The right ventricular size is normal. Tricuspid regurgitation signal is inadequate for assessing PA pressure.  3. A small pericardial effusion is present. There is no evidence of cardiac tamponade.  4. The mitral valve is normal in structure. Trivial mitral valve regurgitation. No evidence of mitral stenosis.  5. The aortic valve is grossly normal. There is mild calcification of the aortic valve. Aortic valve regurgitation is not visualized. No aortic stenosis is present.  6. The inferior vena cava is normal in size with greater than 50% respiratory variability, suggesting right atrial pressure of 3 mmHg. FINDINGS  Left Ventricle: Intracavitary gradient at rest: 10 mmHg, with Valsalva 24 mmHg. Left ventricular ejection fraction, by estimation, is 70 to 75%. The left ventricle has hyperdynamic function. The left ventricle has no regional wall motion abnormalities. The left ventricular internal cavity size was normal in size. There is mild left ventricular hypertrophy. Left ventricular diastolic parameters are consistent with Grade I diastolic dysfunction (impaired relaxation). Right Ventricle: The right ventricular size is normal. No increase in right ventricular wall thickness. Right ventricular systolic function is normal. Tricuspid regurgitation signal is inadequate for assessing PA pressure. The tricuspid  regurgitant velocity is 1.52 m/s, and with an assumed right atrial pressure of 3 mmHg, the estimated right ventricular systolic pressure is 12.2 mmHg. Left Atrium: Left atrial size was normal in size. Right Atrium: Right atrial size was normal in size. Pericardium: A small pericardial effusion is present. There is no evidence of cardiac tamponade. Mitral Valve: The mitral valve is normal in  [address] [place], [place] [address]    Report Status PENDING  Incomplete    Radiology Studies: ECHOCARDIOGRAM COMPLETE  Result Date: 06/12/2023    ECHOCARDIOGRAM REPORT   Patient Name:   [name]. Date of Exam: 06/11/2023 Medical Rec #:  [id number]          Height:       70.0 in Accession #:    [id number]         Weight:       160.1 lb Date of Birth:  October 01, 1950          BSA:          1.899 m Patient Age:    72 years           BP:           129/58 mmHg Patient Gender: M                  HR:           86 bpm. Exam Location:  Inpatient Procedure: 2D Echo, Cardiac Doppler and Color Doppler Indications:    Stroke I63.9  History:        Patient has prior history of Echocardiogram examinations, most                 recent 07/26/2017. PAD; Risk  Factors:Diabetes, Hypertension,                 Sleep Apnea and Dyslipidemia.  Sonographer:    Lucendia Herrlich Referring Phys: [id number] CORTNEY E DE LA TORRE IMPRESSIONS  1. Intracavitary gradient at rest: 10 mmHg, with Valsalva 24 mmHg. Left ventricular ejection fraction, by estimation, is 70 to 75%. The left ventricle has hyperdynamic function. The left ventricle has no regional wall motion abnormalities. There is mild  left ventricular hypertrophy. Left ventricular diastolic parameters are consistent with Grade I diastolic dysfunction (impaired relaxation).  2. Right ventricular systolic function is normal. The right ventricular size is normal. Tricuspid regurgitation signal is inadequate for assessing PA pressure.  3. A small pericardial effusion is present. There is no evidence of cardiac tamponade.  4. The mitral valve is normal in structure. Trivial mitral valve regurgitation. No evidence of mitral stenosis.  5. The aortic valve is grossly normal. There is mild calcification of the aortic valve. Aortic valve regurgitation is not visualized. No aortic stenosis is present.  6. The inferior vena cava is normal in size with greater than 50% respiratory variability, suggesting right atrial pressure of 3 mmHg. FINDINGS  Left Ventricle: Intracavitary gradient at rest: 10 mmHg, with Valsalva 24 mmHg. Left ventricular ejection fraction, by estimation, is 70 to 75%. The left ventricle has hyperdynamic function. The left ventricle has no regional wall motion abnormalities. The left ventricular internal cavity size was normal in size. There is mild left ventricular hypertrophy. Left ventricular diastolic parameters are consistent with Grade I diastolic dysfunction (impaired relaxation). Right Ventricle: The right ventricular size is normal. No increase in right ventricular wall thickness. Right ventricular systolic function is normal. Tricuspid regurgitation signal is inadequate for assessing PA pressure. The tricuspid  regurgitant velocity is 1.52 m/s, and with an assumed right atrial pressure of 3 mmHg, the estimated right ventricular systolic pressure is 12.2 mmHg. Left Atrium: Left atrial size was normal in size. Right Atrium: Right atrial size was normal in size. Pericardium: A small pericardial effusion is present. There is no evidence of cardiac tamponade. Mitral Valve: The mitral valve is normal in

## 2023-06-13 ENCOUNTER — Inpatient Hospital Stay (HOSPITAL_COMMUNITY): Payer: Medicare HMO

## 2023-06-13 DIAGNOSIS — G4733 Obstructive sleep apnea (adult) (pediatric): Secondary | ICD-10-CM | POA: Diagnosis not present

## 2023-06-13 DIAGNOSIS — I739 Peripheral vascular disease, unspecified: Secondary | ICD-10-CM | POA: Diagnosis not present

## 2023-06-13 DIAGNOSIS — I639 Cerebral infarction, unspecified: Secondary | ICD-10-CM

## 2023-06-13 DIAGNOSIS — Z940 Kidney transplant status: Secondary | ICD-10-CM | POA: Diagnosis not present

## 2023-06-13 DIAGNOSIS — K3184 Gastroparesis: Secondary | ICD-10-CM

## 2023-06-13 DIAGNOSIS — M869 Osteomyelitis, unspecified: Secondary | ICD-10-CM | POA: Diagnosis not present

## 2023-06-13 LAB — BASIC METABOLIC PANEL
Anion gap: 8 (ref 5–15)
BUN: 21 mg/dL (ref 8–23)
CO2: 18 mmol/L — ABNORMAL LOW (ref 22–32)
Calcium: 8.8 mg/dL — ABNORMAL LOW (ref 8.9–10.3)
Chloride: 106 mmol/L (ref 98–111)
Creatinine, Ser: 1.03 mg/dL (ref 0.61–1.24)
GFR, Estimated: >60 mL/min (ref >60)
Glucose, Bld: 195 mg/dL — ABNORMAL HIGH (ref 70–99)
Potassium: 4 mmol/L (ref 3.5–5.1)
Sodium: 132 mmol/L — ABNORMAL LOW (ref 135–145)

## 2023-06-13 LAB — CBC
HCT: 33.3 % — ABNORMAL LOW (ref 39.0–52.0)
Hemoglobin: 10.8 g/dL — ABNORMAL LOW (ref 13.0–17.0)
MCH: 25.4 pg — ABNORMAL LOW (ref 26.0–34.0)
MCHC: 32.4 g/dL (ref 30.0–36.0)
MCV: 78.4 fL — ABNORMAL LOW (ref 80.0–100.0)
Platelets: 230 10*3/uL (ref 150–400)
RBC: 4.25 MIL/uL (ref 4.22–5.81)
RDW: 14.3 % (ref 11.5–15.5)
WBC: 11.4 10*3/uL — ABNORMAL HIGH (ref 4.0–10.5)
nRBC: 0 % (ref 0.0–0.2)

## 2023-06-13 LAB — HEPATIC FUNCTION PANEL
ALT: 15 U/L (ref 0–44)
AST: 15 U/L (ref 15–41)
Albumin: 3.4 g/dL — ABNORMAL LOW (ref 3.5–5.0)
Alkaline Phosphatase: 80 U/L (ref 38–126)
Bilirubin, Direct: 0.2 mg/dL (ref 0.0–0.2)
Indirect Bilirubin: 0.9 mg/dL (ref 0.3–0.9)
Total Bilirubin: 1.1 mg/dL (ref 0.3–1.2)
Total Protein: 7 g/dL (ref 6.5–8.1)

## 2023-06-13 LAB — GLUCOSE, CAPILLARY
Glucose-Capillary: 170 mg/dL — ABNORMAL HIGH (ref 70–99)
Glucose-Capillary: 189 mg/dL — ABNORMAL HIGH (ref 70–99)
Glucose-Capillary: 264 mg/dL — ABNORMAL HIGH (ref 70–99)

## 2023-06-13 LAB — MAGNESIUM: Magnesium: 1.8 mg/dL (ref 1.7–2.4)

## 2023-06-13 LAB — LIPASE, BLOOD: Lipase: 22 U/L (ref 11–51)

## 2023-06-13 LAB — TROPONIN I (HIGH SENSITIVITY): Troponin I (High Sensitivity): 17 ng/L (ref <18)

## 2023-06-13 LAB — LACTIC ACID, PLASMA
Lactic Acid, Venous: 1.8 mmol/L (ref 0.5–1.9)
Lactic Acid, Venous: 1.1 mmol/L (ref 0.5–1.9)

## 2023-06-13 MED ORDER — AMLODIPINE BESYLATE 5 MG PO TABS
5.0000 mg | ORAL_TABLET | Freq: Every day | ORAL | Status: DC
  Administered 2023-06-13 – 2023-06-22 (×10): 5 mg via ORAL
  Filled 2023-06-13 (×10): qty 1

## 2023-06-13 MED ORDER — METOCLOPRAMIDE HCL 5 MG/ML IJ SOLN
5.0000 mg | Freq: Three times a day (TID) | INTRAMUSCULAR | Status: DC
  Administered 2023-06-13 – 2023-06-17 (×15): 5 mg via INTRAVENOUS
  Filled 2023-06-13 (×15): qty 2

## 2023-06-13 MED ORDER — METOCLOPRAMIDE HCL 5 MG/ML IJ SOLN
INTRAMUSCULAR | Status: AC
  Administered 2023-06-13: 5 mg via INTRAVENOUS
  Filled 2023-06-13: qty 2

## 2023-06-13 NOTE — Progress Notes (Signed)
Carotid duplex has been completed.   Results can be found under chart review under CV PROC. 06/13/2023 3:29 PM Geeta Dworkin RVT, RDMS

## 2023-06-13 NOTE — Progress Notes (Signed)
PROGRESS NOTE  [name]. VHQ:[id number] DOB: Aug 01, 1951   PCP: [name], MD  Patient is from: Home.  Lives with his wife.  DOA: 06/06/2023 LOS: 7  Chief complaints Chief Complaint  Patient presents with   Weakness     Brief Narrative / Interim history: 72 year old M with PMH of renal/pancreatic transplant at [facility], DM-2 with retinopathy, left BKA and chronic right foot wound, HTN, OSA, left eye blindness and poor vision in right eye presented to ED after he woke up with chills, and admitted for sepsis due to acute on chronic right foot ulcer with osteomyelitis.  MRI of his fluid was consistent with osteomyelitis.  ABI performed and showed PVD.  Orthopedic surgery, Dr. Lajoyce Corners and vascular surgery consulted.  He could not undergo angiography due to nausea and dysphagia.  GI was consulted and was to perform EGD which was also canceled due to elevated BP and diaphoresis.  He had acute right eye blurry vision.  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  MRA head without significant finding.  Carotid ultrasound pending.  Neurology following.  Patient remains on IV cefepime and vancomycin for right foot osteomyelitis.   Subjective: Seen and examined earlier this morning.  No major events overnight.  Suddenly became nauseous with diaphoresis, dizziness and weakness later this morning.  Seems like he had similar episode yesterday afternoon with elevated blood pressure.  Denies chest pain or abdominal pain.  Objective: Vitals:   06/13/23 0400 06/13/23 0600 06/13/23 0803 06/13/23 1116  BP: (!) 131/55 (!) 149/67 (!) 147/56 (!) 195/91  Pulse: 90 90 88 (!) 106  Resp: 18 16 16 19   Temp:   98.2 F (36.8 C) 98.3 F (36.8 C)  TempSrc:   Oral Oral  SpO2: 98% 96% 99% 100%  Weight:      Height:        Examination:  GENERAL: Dry heaving. HEENT: MMM.  Barely see from right eye.  Completely blind from left eye. NECK: Supple.  No  apparent JVD.  RESP:  No IWOB.  Fair aeration bilaterally. CVS:  RRR. Heart sounds normal.  ABD/GI/GU: BS+. Abd soft, NTND.  MSK/EXT: Significant muscle mass and subcu fat loss.  Ulcer at the base of right great toe.  Not able to palpate DP pulse.  Left BKA. SKIN: As above. NEURO: Awake, alert and oriented appropriately.  PERRL.  No facial asymmetry.  PSYCH: Calm. Normal affect.   Procedures:  None  Microbiology summarized: 9/8-urine culture NGTD 9/8-blood culture NGTD 9/11-MRSA PCR screen negative 9/13-blood cultures negative  Assessment and plan: Principal Problem:   Osteomyelitis of great toe of right foot (HCC) Active Problems:   PAD (peripheral artery disease) (HCC)   S/P BKA (below knee amputation) unilateral (HCC)   OSA on CPAP   History of simultaneous kidney and pancreas transplant (HCC)  Sepsis due to acute on chronic right foot ulcer with osteomyelitis/PAD-MRI positive for osteomyelitis.  ABI with PVD. LE Angio postponed in the setting of dysphagia and nausea -Continue IV cefepime and vancomycin -Orthopedic surgery and vascular surgery following -Continue aspirin and statin.   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  Doubt correlation between symptoms and CVA.  Blurry vision could be due to retinopathy, hypoglycemia and possible glaucoma.  MRI brain with significant finding. -Neurology following -Continue Lipitor and low-dose aspirin -Follow-up carotid ultrasound -Optimize glycemic control  Intermittent nausea and  diaphoresis-unclear etiology of this but suspect gastroparesis.  Other rare possibilities include pheochromocytoma given associated blood pressure and tachycardia.  Has no chest pain or respiratory symptoms.  Denies abdominal pain.  EKG without acute ischemic finding. -Scheduled Reglan every 6 hours -Check plasma metanephrine, troponin, LFT, lipase and lactic  acid -Will reconsult GI if no improvement with scheduled Reglan.  Uncontrolled IDDM-2 with retinopathy, right foot wound infection and left BKA: A1c 11.1%. Recent Labs  Lab 06/12/23 1100 06/12/23 1650 06/12/23 2044 06/13/23 0607 06/13/23 1129  GLUCAP 97 143* 167* 170* 189*  -Continue SSI-very sensitive given labile blood glucose -Further adjustment as appropriate. -Patient may need assistance with feeding due to blindness.  History of pancreatic/renal transplant, 2008 -On CellCept and tacrolimus.  Follows mostly at the [facility] with Dr. Arrie Aran   Chronic left-sided blindness: No significant finding on MRI orbit.  Essential hypertension, uncontrolled: Currently elevated partly due to distress from dry heaving. -Continue losartan -Add low-dose amlodipine- -IV hydralazine as needed   Hyperlipidemia -Continue Lipitor   Obstructive sleep apnea -Needs outpatient sleep study    Body mass index is 22.97 kg/m.           DVT prophylaxis:  enoxaparin (LOVENOX) injection 40 mg Start: 06/11/23 1000  Code Status: Full code Family Communication: None at bedside Level of care: Progressive Status is: Inpatient Remains inpatient appropriate because: Osteomyelitis, PAD, stroke   Final disposition: TBD Consultants:  Orthopedic surgery Vascular surgery Gastroenterology Neurology  55 minutes with more than 50% spent in reviewing records, counseling patient/family and coordinating care.   Sch Meds:  Scheduled Meds:  amLODipine  5 mg Oral Daily   ascorbic acid  250 mg Oral Q M,W,F   aspirin EC  81 mg Oral Daily   atorvastatin  80 mg Oral QHS   enoxaparin (LOVENOX) injection  40 mg Subcutaneous Q24H   furosemide  20 mg Oral Daily   insulin aspart  0-6 Units Subcutaneous TID WC   leptospermum manuka honey  1 Application Topical Daily   losartan  50 mg Oral Daily   metoCLOPramide (REGLAN) injection  5 mg Intravenous TID AC & HS   mycophenolate  1,000 mg  Oral BID   pantoprazole (PROTONIX) IV  40 mg Intravenous Q12H   potassium chloride SA  20 mEq Oral Daily   tacrolimus  5 mg Oral BID   Vitamin D (Ergocalciferol)  50,000 Units Oral Q Wed   Continuous Infusions:  ceFEPime (MAXIPIME) IV 2 g (06/13/23 0958)   vancomycin 1,000 mg (06/13/23 0326)   PRN Meds:.acetaminophen **OR** acetaminophen, guaiFENesin, hydrALAZINE, ipratropium-albuterol, melatonin, metoprolol tartrate, polyethylene glycol, senna-docusate, traZODone  Antimicrobials: Anti-infectives (From admission, onward)    Start     Dose/Rate Route Frequency Ordered Stop   06/12/23 1530  vancomycin (VANCOCIN) IVPB 1000 mg/200 mL premix        1,000 mg 200 mL/hr over 60 Minutes Intravenous Every 12 hours 06/12/23 1439     06/09/23 0945  ceFAZolin (ANCEF) IVPB 2g/100 mL premix        2 g 200 mL/hr over 30 Minutes Intravenous On call to O.R. 06/09/23 0857 06/10/23 0559   06/08/23 1300  vancomycin (VANCOREADY) IVPB 1500 mg/300 mL  Status:  Discontinued        1,500 mg 150 mL/hr over 120 Minutes Intravenous Every 24 hours 06/08/23 0904 06/12/23 1439   06/08/23 1000  vancomycin (VANCOREADY) IVPB 1750 mg/350 mL  Status:  Discontinued        1,750 mg  175 mL/hr over 120 Minutes Intravenous  Once 06/07/23 1342 06/08/23 0904   06/08/23 0915  ceFEPIme (MAXIPIME) 2 g in sodium chloride 0.9 % 100 mL IVPB        2 g 200 mL/hr over 30 Minutes Intravenous Every 8 hours 06/08/23 0901     06/07/23 1215  vancomycin (VANCOREADY) IVPB 1500 mg/300 mL        1,500 mg 150 mL/hr over 120 Minutes Intravenous  Once 06/07/23 1209 06/07/23 1501   06/07/23 1215  ceFEPIme (MAXIPIME) 2 g in sodium chloride 0.9 % 100 mL IVPB  Status:  Discontinued        2 g 200 mL/hr over 30 Minutes Intravenous 2 times daily 06/07/23 1209 06/08/23 0901   06/06/23 2300  piperacillin-tazobactam (ZOSYN) IVPB 3.375 g  Status:  Discontinued       Note to Pharmacy: Please dose   3.375 g 12.5 mL/hr over 240 Minutes Intravenous  Every 8 hours 06/06/23 2244 06/07/23 1151   06/06/23 2100  ceFEPIme (MAXIPIME) 2 g in sodium chloride 0.9 % 100 mL IVPB        2 g 200 mL/hr over 30 Minutes Intravenous  Once 06/06/23 2055 06/06/23 2302        I have personally reviewed the following labs and images: CBC: Recent Labs  Lab 06/06/23 1622 06/07/23 0527 06/09/23 0242 06/10/23 0347 06/11/23 0935 06/12/23 0336 06/13/23 0456  WBC 13.9*   < > 16.3* 15.8* 15.9* 13.2* 11.4*  NEUTROABS 11.6*  --   --   --   --   --   --   HGB 11.5*   < > 11.6* 13.1 12.9* 12.0* 10.8*  HCT 36.4*   < > 36.9* 39.5 39.5 36.3* 33.3*  MCV 81.8   < > 80.0 79.6* 81.3 80.5 78.4*  PLT 201   < > 251 237 279 260 230   < > = values in this interval not displayed.   BMP &GFR Recent Labs  Lab 06/09/23 0242 06/10/23 0347 06/11/23 0935 06/12/23 0336 06/13/23 0456  NA 136 135 136 134* 132*  K 4.0 3.6 3.6 3.2* 4.0  CL 103 103 106 104 106  CO2 18* 19* 16* 19* 18*  GLUCOSE 226* 124* 228* 93 195*  BUN 12 17 25* 26* 21  CREATININE 0.93 0.93 1.06 1.19 1.03  CALCIUM 9.0 9.0 9.2 9.2 8.8*  MG 1.5* 2.2 2.0 2.1 1.8   Estimated Creatinine Clearance: 68.5 mL/min (by C-G formula based on SCr of 1.03 mg/dL). Liver & Pancreas: Recent Labs  Lab 06/06/23 1622  AST 14*  ALT 15  ALKPHOS 99  BILITOT 0.6  PROT 7.2  ALBUMIN 3.6   No results for input(s): "LIPASE", "AMYLASE" in the last 168 hours. No results for input(s): "AMMONIA" in the last 168 hours. Diabetic: No results for input(s): "HGBA1C" in the last 72 hours. Recent Labs  Lab 06/12/23 1100 06/12/23 1650 06/12/23 2044 06/13/23 0607 06/13/23 1129  GLUCAP 97 143* 167* 170* 189*   Cardiac Enzymes: No results for input(s): "CKTOTAL", "CKMB", "CKMBINDEX", "TROPONINI" in the last 168 hours. No results for input(s): "PROBNP" in the last 8760 hours. Coagulation Profile: No results for input(s): "INR", "PROTIME" in the last 168 hours. Thyroid Function Tests: No results for input(s): "TSH",  "T4TOTAL", "FREET4", "T3FREE", "THYROIDAB" in the last 72 hours. Lipid Profile: Recent Labs    06/12/23 0336  CHOL 130  HDL 23*  LDLCALC 76  TRIG 962*  CHOLHDL 5.7   Anemia Panel: No  results for input(s): "VITAMINB12", "FOLATE", "FERRITIN", "TIBC", "IRON", "RETICCTPCT" in the last 72 hours. Urine analysis:    Component Value Date/Time   COLORURINE YELLOW 06/06/2023 1834   APPEARANCEUR CLEAR 06/06/2023 1834   LABSPEC 1.018 06/06/2023 1834   PHURINE 6.0 06/06/2023 1834   GLUCOSEU >=500 (A) 06/06/2023 1834   HGBUR NEGATIVE 06/06/2023 1834   BILIRUBINUR NEGATIVE 06/06/2023 1834   KETONESUR 20 (A) 06/06/2023 1834   PROTEINUR 100 (A) 06/06/2023 1834   UROBILINOGEN 1.0 09/29/2013 2339   NITRITE NEGATIVE 06/06/2023 1834   LEUKOCYTESUR NEGATIVE 06/06/2023 1834   Sepsis Labs: Invalid input(s): "PROCALCITONIN", "LACTICIDVEN"  Microbiology: Recent Results (from the past 240 hour(s))  SARS Coronavirus 2 by RT PCR (hospital order, performed in [facility] hospital lab) *cepheid single result test* Anterior Nasal Swab     Status: None   Collection Time: 06/06/23  3:47 PM   Specimen: Anterior Nasal Swab  Result Value Ref Range Status   SARS Coronavirus 2 by RT PCR NEGATIVE NEGATIVE Final    Comment: Performed at [facility], [address], [place], [place] [address]  Blood culture (routine x 2)     Status: None   Collection Time: 06/06/23  6:15 PM   Specimen: BLOOD LEFT ARM  Result Value Ref Range Status   Specimen Description BLOOD LEFT ARM  Final   Special Requests   Final    BOTTLES DRAWN AEROBIC AND ANAEROBIC Blood Culture adequate volume   Culture   Final    NO GROWTH 5 DAYS Performed at [facility], [address] [place], [place] [address]    Report Status 06/11/2023 FINAL  Final  Blood culture (routine x 2)     Status: None   Collection Time: 06/06/23  6:15 PM   Specimen: BLOOD LEFT HAND  Result Value Ref Range Status   Specimen Description BLOOD LEFT  HAND  Final   Special Requests   Final    BOTTLES DRAWN AEROBIC AND ANAEROBIC Blood Culture adequate volume   Culture   Final    NO GROWTH 5 DAYS Performed at [facility], [address] [place], [place] [address]    Report Status 06/11/2023 FINAL  Final  Urine Culture     Status: None   Collection Time: 06/06/23  6:34 PM   Specimen: Urine, Clean Catch  Result Value Ref Range Status   Specimen Description URINE, CLEAN CATCH  Final   Special Requests NONE  Final   Culture   Final    NO GROWTH Performed at [facility], [address], [place], [place] [address]    Report Status 06/07/2023 FINAL  Final  Surgical PCR screen     Status: None   Collection Time: 06/09/23  3:59 AM   Specimen: Nasal Mucosa; Nasal Swab  Result Value Ref Range Status   MRSA, PCR NEGATIVE NEGATIVE Final   Staphylococcus aureus NEGATIVE NEGATIVE Final    Comment: (NOTE) The Xpert SA Assay (FDA approved for NASAL specimens in patients 41 years of age and older), is one component of a comprehensive surveillance program. It is not intended to diagnose infection nor to guide or monitor treatment. Performed at [facility], [address], [place], [place] [address]   Culture, blood (Routine X 2) w Reflex to ID Panel     Status: None (Preliminary result)   Collection Time: 06/11/23  9:35 AM   Specimen: BLOOD LEFT WRIST  Result Value Ref Range Status  Specimen Description BLOOD LEFT WRIST  Final   Special Requests   Final    BOTTLES DRAWN AEROBIC ONLY Blood Culture adequate volume   Culture   Final    NO GROWTH 2 DAYS Performed at [facility], [address], [place], [place] [address]    Report Status PENDING  Incomplete    Radiology Studies: MR ANGIO HEAD WO CONTRAST  Result Date: 06/12/2023 CLINICAL DATA:  Stroke/TIA EXAM: MRA HEAD WITHOUT CONTRAST TECHNIQUE: Angiographic images of the Circle of Willis were acquired using MRA technique without intravenous contrast.  COMPARISON:  None Available. FINDINGS: POSTERIOR CIRCULATION: --Vertebral arteries: Normal --Inferior cerebellar arteries: Normal. --Basilar artery: Normal. --Superior cerebellar arteries: Normal. --Posterior cerebral arteries: Mild atherosclerotic irregularity without high-grade stenosis. ANTERIOR CIRCULATION: --Intracranial internal carotid arteries: Normal. --Anterior cerebral arteries (ACA): Normal. --Middle cerebral arteries (MCA): Normal. Other: None. IMPRESSION: Normal intracranial MRA. Electronically Signed   By: Deatra Robinson M.D.   On: 06/12/2023 22:49      Chiann Goffredo T. Rohith Fauth [facility]  If 7PM-7AM, please contact night-coverage [email or web address] 06/13/2023, 12:29 PM

## 2023-06-13 NOTE — Progress Notes (Signed)
SLP Cancellation Note  Patient Details Name: [name]. MRN: [id number] DOB: 04/18/1951   Cancelled treatment:       Reason Eval/Treat Not Completed: Patient declined, no reason specified. Despite attempts at education regarding importance of SLE, pt declined to participate. Will continue attempts.   Gwynneth Aliment, M.A., CF-SLP Speech Language Pathology, Acute Rehabilitation Services  Secure Chat preferred [phone]  06/13/2023, 4:19 PM

## 2023-06-13 NOTE — Progress Notes (Addendum)
STROKE TEAM PROGRESS NOTE   BRIEF HPI [name]. is a 72 y.o. male with history of renal and pancreatic transplant, diabetes, hypertension, left BKA, sleep apnea and right foot wound who was admitted with osteomyelitis of the right foot.  During admission, patient developed headaches, dizziness and dysphagia which prompted EGD.  Prior to EGD, patient had significantly elevated blood pressure and diaphoresis.  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  Patient also has had some visual difficulties, and he has been dropping objects with his left hand.    However, stroke is very small and does not correlate with most of his deficits.    SIGNIFICANT HOSPITAL EVENTS  9/13: EGD cancelled by anesthesia d/t tachycardia, hypertension 9/14: Increase lipitor 40-80. MRA negative.   INTERIM HISTORY/SUBJECTIVE  Patient uncomfortable, exam limited by sweating, dizziness, nausea, hypertension -- likely autonomic dysfunction ISO uncontrolled diabetes. No focal findings. Patient grossly weak: cannot hold hands up against gravity for any length of time.    OBJECTIVE  CBC    Component Value Date/Time   WBC 11.4 (H) 06/13/2023 0456   RBC 4.25 06/13/2023 0456   HGB 10.8 (L) 06/13/2023 0456   HCT 33.3 (L) 06/13/2023 0456   PLT 230 06/13/2023 0456   MCV 78.4 (L) 06/13/2023 0456   MCH 25.4 (L) 06/13/2023 0456   MCHC 32.4 06/13/2023 0456   RDW 14.3 06/13/2023 0456   LYMPHSABS 1.0 06/06/2023 1622   MONOABS 0.9 06/06/2023 1622   EOSABS 0.1 06/06/2023 1622   BASOSABS 0.1 06/06/2023 1622    BMET    Component Value Date/Time   NA 132 (L) 06/13/2023 0456   K 4.0 06/13/2023 0456   CL 106 06/13/2023 0456   CO2 18 (L) 06/13/2023 0456   GLUCOSE 195 (H) 06/13/2023 0456   BUN 21 06/13/2023 0456   CREATININE 1.03 06/13/2023 0456   CALCIUM 8.8 (L) 06/13/2023 0456   GFRNONAA >60 06/13/2023 0456    IMAGING past 24 hours MR ANGIO HEAD WO  CONTRAST  Result Date: 06/12/2023 CLINICAL DATA:  Stroke/TIA EXAM: MRA HEAD WITHOUT CONTRAST TECHNIQUE: Angiographic images of the Circle of Willis were acquired using MRA technique without intravenous contrast. COMPARISON:  None Available. FINDINGS: POSTERIOR CIRCULATION: --Vertebral arteries: Normal --Inferior cerebellar arteries: Normal. --Basilar artery: Normal. --Superior cerebellar arteries: Normal. --Posterior cerebral arteries: Mild atherosclerotic irregularity without high-grade stenosis. ANTERIOR CIRCULATION: --Intracranial internal carotid arteries: Normal. --Anterior cerebral arteries (ACA): Normal. --Middle cerebral arteries (MCA): Normal. Other: None. IMPRESSION: Normal intracranial MRA. Electronically Signed   By: Deatra Robinson M.D.   On: 06/12/2023 22:49    Vitals:   06/13/23 0400 06/13/23 0600 06/13/23 0803 06/13/23 1116  BP: (!) 131/55 (!) 149/67 (!) 147/56 (!) 195/91  Pulse: 90 90 88 (!) 106  Resp: 18 16 16 19   Temp:   98.2 F (36.8 C) 98.3 F (36.8 C)  TempSrc:   Oral Oral  SpO2: 98% 96% 99% 100%  Weight:      Height:         PHYSICAL EXAM General:  Uncomfortable, sweating patient in some distress Psych:  Mood and affect appropriate for situation CV: Regular rate and rhythm on monitor Respiratory:  Regular, unlabored respirations on room air GI: Abdomen soft and nontender   NEURO:  Mental Status: AA&Ox3, patient is able to give clear and coherent history Speech/Language: speech is without dysarthria or aphasia.  Naming, repetition, fluency, and comprehension intact.  Cranial Nerves:  II: PERRL.  Blind in  left eye at baseline.  Additional visual deficits in right visual fields but is able to see motion. III, IV, VI: EOMI. Eyelids elevate symmetrically.  V: Sensation is intact to light touch and symmetrical to face.  VII: Face is symmetrical resting and smiling VIII: hearing intact to voice. IX, X: Palate elevates symmetrically. Phonation is normal.   ZO:[id number] shrug 2/5. XII: tongue is midline without fasciculations. Motor: Gross 2/5 weakness in bilateral U and L extremities Tone: is normal and bulk is normal Sensation: Intact to light touch bilaterally. Extinction absent to light touch to DSS.   Coordination: FTN intact bilaterally, HKS: no ataxia in BLE.No drift.  Gait: deferred  Overall: Neurological exam limited by autonomic dysfunction. Non-focal.  NIH: 0  ASSESSMENT/PLAN  Acute Ischemic Infarct:  right frontal centrum semiovale lacunar infarct, incidental finding, likely due to small vessel disease with uncontrolled multiple risk factors.  MRI  Acute perforator infarct in the right frontal centrum semiovale. MRA  Normal Carotid Doppler unremarkable 2D Echo: LVEF 70-75%  LDL 76 HgbA1c 11.1 VTE prophylaxis - lovenox aspirin 81 mg daily prior to admission, now on aspirin 81 mg daily.  Pending clearance from GI and vascular surgery/procedures to start DAPT for 3 weeks then followed by aspirin alone. Therapy recommendations:  Home Health PT Disposition:  pending  Episode of tachycarida, diaphoresis, hypertension and nausea 9/13 occurred before EGD 9/14 occurred with PT/OT transition to chair 9/15 occurred lying in bed Concerning for autonomic dysfunction vs. Gastroparesis Could be related to uncontrolled diabetes.   Hypertension Home meds: Amlodipine 10 mg, Lasix 20 mg, labetalol 100 mg twice daily, lisinopril 10 mg, losartan 50 mg--on hold.  On cozaar now Out of permissive hypertension window, gradually normalize BP in 2-3 days Long term BP goal normotensive  Hyperlipidemia Home meds: Atorvastatin 40 mg LDL 76, goal < 70 Increase to Lipitor 80 mg Continue statin at discharge  Diabetes type II Uncontrolled Home meds: Insulin, metformin 500 mg HgbA1c 11.1, goal < 7.0 CBGs SSI On insulin Recommend close follow-up with PCP for better DM control  PVD Vascular on board Right foot osteomyelitis Left BKA LE  arteriogram scheduled next week On cefepime and vanco On ASA now  Dysphagia Patient has post-stroke dysphagia, SLP consulted Full liquid diet now Advance diet as tolerated  Other Stroke Risk Factors Advanced age Obstructive sleep apnea, on CPAP at home  Other Active Problems Legally blind, L eye S/p Renal and pancreatic transplant on cellcept and prograf PTSD GERD  Hospital day # 7   Luiz Iron, MD Psychiatry Resident, PGY-1 Please use AMION for contact information & EPIC for messaging.  ATTENDING NOTE: I reviewed above note and agree with the assessment and plan. Pt was seen and examined.   One male family member at the bedside. Pt lying in bed, not comfortable, just had again episode of tachycardia, hypertension, nausea, diaphoresis, which concerning for gastroparesis vs. Autonomic dysfunction from uncontrolled diabetes. MRI and CUS negative. Continue ASA and statin for now. Once cleared by GI and no more procedure from [facility]VS, may consider DAPT for 3 weeks and then ASA alone. PT and OT recommend HH.  For detailed assessment and plan, please refer to above/below as I have made changes wherever appropriate.   Neurology will sign off. Please call with questions. Pt will follow up with stroke clinic NP at [facility] in about 4 weeks. Thanks for the consult.   Marvel Plan, MD PhD Stroke Neurology 06/13/2023 5:16 PM      To contact Stroke  Continuity provider, please refer to [email or web address]. After hours, contact General Neurology

## 2023-06-14 DIAGNOSIS — M869 Osteomyelitis, unspecified: Secondary | ICD-10-CM | POA: Diagnosis not present

## 2023-06-14 DIAGNOSIS — I70235 Atherosclerosis of native arteries of right leg with ulceration of other part of foot: Secondary | ICD-10-CM | POA: Diagnosis not present

## 2023-06-14 LAB — CBC
HCT: 29.9 % — ABNORMAL LOW (ref 39.0–52.0)
Hemoglobin: 10.1 g/dL — ABNORMAL LOW (ref 13.0–17.0)
MCH: 26.2 pg (ref 26.0–34.0)
MCHC: 33.8 g/dL (ref 30.0–36.0)
MCV: 77.7 fL — ABNORMAL LOW (ref 80.0–100.0)
Platelets: 221 10*3/uL (ref 150–400)
RBC: 3.85 MIL/uL — ABNORMAL LOW (ref 4.22–5.81)
RDW: 14.3 % (ref 11.5–15.5)
WBC: 11.3 10*3/uL — ABNORMAL HIGH (ref 4.0–10.5)
nRBC: 0 % (ref 0.0–0.2)

## 2023-06-14 LAB — BASIC METABOLIC PANEL
Anion gap: 10 (ref 5–15)
BUN: 20 mg/dL (ref 8–23)
CO2: 17 mmol/L — ABNORMAL LOW (ref 22–32)
Calcium: 8.6 mg/dL — ABNORMAL LOW (ref 8.9–10.3)
Chloride: 106 mmol/L (ref 98–111)
Creatinine, Ser: 1.06 mg/dL (ref 0.61–1.24)
GFR, Estimated: >60 mL/min (ref >60)
Glucose, Bld: 210 mg/dL — ABNORMAL HIGH (ref 70–99)
Potassium: 4.5 mmol/L (ref 3.5–5.1)
Sodium: 133 mmol/L — ABNORMAL LOW (ref 135–145)

## 2023-06-14 LAB — C-REACTIVE PROTEIN: CRP: 0.9 mg/dL (ref <1.0)

## 2023-06-14 LAB — GLUCOSE, CAPILLARY
Glucose-Capillary: 202 mg/dL — ABNORMAL HIGH (ref 70–99)
Glucose-Capillary: 218 mg/dL — ABNORMAL HIGH (ref 70–99)
Glucose-Capillary: 249 mg/dL — ABNORMAL HIGH (ref 70–99)

## 2023-06-14 LAB — MAGNESIUM: Magnesium: 1.6 mg/dL — ABNORMAL LOW (ref 1.7–2.4)

## 2023-06-14 MED ORDER — INSULIN GLARGINE-YFGN 100 UNIT/ML ~~LOC~~ SOLN
7.0000 [IU] | Freq: Every day | SUBCUTANEOUS | Status: DC
  Administered 2023-06-14 – 2023-06-17 (×4): 7 [IU] via SUBCUTANEOUS
  Filled 2023-06-14 (×5): qty 0.07

## 2023-06-14 MED ORDER — ONDANSETRON HCL 4 MG/2ML IJ SOLN
4.0000 mg | Freq: Four times a day (QID) | INTRAMUSCULAR | Status: DC | PRN
  Administered 2023-06-15: 4 mg via INTRAVENOUS
  Filled 2023-06-14: qty 2

## 2023-06-14 NOTE — Inpatient Diabetes Management (Signed)
Inpatient Diabetes Program Recommendations  AACE/ADA: New Consensus Statement on Inpatient Glycemic Control (2015)  Target Ranges:  Prepandial:   less than 140 mg/dL      Peak postprandial:   less than 180 mg/dL (1-2 hours)      Critically ill patients:  140 - 180 mg/dL    Latest Reference Range & Units 06/13/23 06:07 06/13/23 11:29 06/13/23 20:26  Glucose-Capillary 70 - 99 mg/dL 161 (H)   096 (H) 045 (H)  (H): Data is abnormally high  Latest Reference Range & Units 06/14/23 06:05  Glucose-Capillary 70 - 99 mg/dL 409 (H)  2 units Novolog  (H): Data is abnormally high     Home DM Meds: Lantus 28 units Daily     Metformin 500 mg bid   Current Orders: Novolog 0-6 units TID     MD- Note pt has not received any Semglee insulin since 09/13  CBG 202 this AM  Please consider starting Semglee 7 units Daily (25% total home dose)     --Will follow patient during hospitalization--  Ambrose Finland RN, MSN, CDCES Diabetes Coordinator Inpatient Glycemic Control Team Team Pager: [phone] (8a-5p)

## 2023-06-14 NOTE — Progress Notes (Addendum)
  Progress Note    06/14/2023 7:59 AM 3 Days Post-Op  Subjective:  no complaints   Vitals:   06/14/23 0505 06/14/23 0622  BP: (!) 170/72 (!) 133/54  Pulse: 92 80  Resp:  15  Temp: 98.6 F (37 C)   SpO2: 98% 98%   Physical Exam: Lungs:  non labored Extremities:  R foot plantar wound Neurologic: A&O  CBC    Component Value Date/Time   WBC 11.3 (H) 06/14/2023 0346   RBC 3.85 (L) 06/14/2023 0346   HGB 10.1 (L) 06/14/2023 0346   HCT 29.9 (L) 06/14/2023 0346   PLT 221 06/14/2023 0346   MCV 77.7 (L) 06/14/2023 0346   MCH 26.2 06/14/2023 0346   MCHC 33.8 06/14/2023 0346   RDW 14.3 06/14/2023 0346   LYMPHSABS 1.0 06/06/2023 1622   MONOABS 0.9 06/06/2023 1622   EOSABS 0.1 06/06/2023 1622   BASOSABS 0.1 06/06/2023 1622    BMET    Component Value Date/Time   NA 133 (L) 06/14/2023 0346   K 4.5 06/14/2023 0346   CL 106 06/14/2023 0346   CO2 17 (L) 06/14/2023 0346   GLUCOSE 210 (H) 06/14/2023 0346   BUN 20 06/14/2023 0346   CREATININE 1.06 06/14/2023 0346   CALCIUM 8.6 (L) 06/14/2023 0346   GFRNONAA >60 06/14/2023 0346   GFRAA >60 06/25/2020 1819    INR    Component Value Date/Time   INR 1.0 05/05/2020 1956     Intake/Output Summary (Last 24 hours) at 06/14/2023 0759 Last data filed at 06/14/2023 0146 Gross per 24 hour  Intake --  Output 525 ml  Net -525 ml     Assessment/Plan:  72 y.o. male with R foot wound 3 Days Post-Op   Plan is for Aortogram with R leg runoff with possible intervention by Dr. Myra Gianotti tomorrow due to R plantar foot wound.  Npo past midnight.  Consent ordered.  All questions answered.   Emilie Rutter, PA-C [facility] [phone] 06/14/2023 7:59 AM  VASCULAR STAFF ADDENDUM: I have independently interviewed and examined the patient. I agree with the above.    Victorino Sparrow MD [facility] Phone Number: [phone] 06/14/2023 10:13 AM

## 2023-06-14 NOTE — Evaluation (Signed)
Speech Language Pathology Evaluation Patient Details Name: [name]. MRN: [id number] DOB: May 13, 1951 Today's Date: 06/14/2023 Time: 4098-1191 SLP Time Calculation (min) (ACUTE ONLY): 10 min  Problem List:  Patient Active Problem List   Diagnosis Date Noted   Osteomyelitis of great toe of right foot (HCC) 06/07/2023   Sepsis (HCC) 06/06/2023   Gastroparesis 02/23/2021   Pancreas transplant status (HCC) 02/22/2021   Hypertension    COVID-19 virus infection 09/2020   History of left below knee amputation (HCC) 07/22/2020   Type 2 diabetes mellitus with diabetic polyneuropathy, with long-term current use of insulin (HCC) 07/22/2020   History of simultaneous kidney and pancreas transplant (HCC) 07/22/2020   Dyslipidemia 07/22/2020   DKA (diabetic ketoacidoses) 01/25/2020   OSA on CPAP 09/13/2018   Intractable nausea and vomiting 09/12/2018   Kidney transplant recipient 09/12/2018   DKA, type 1 (HCC) 01/05/2018   Nausea and vomiting 10/19/2017   Nausea & vomiting 10/18/2017   Hematemesis 07/24/2017   Hypertensive urgency 07/24/2017   Acute GI bleeding 07/24/2017   Status post below knee amputation of left lower extremity (HCC) 02/22/2015   S/P BKA (below knee amputation) unilateral (HCC) 02/20/2015   Ischemic ulcer of toe (HCC)    PAD (peripheral artery disease) (HCC)    PVD (peripheral vascular disease) (HCC) 01/17/2015   Cellulitis and abscess of foot 01/13/2015   Cellulitis and abscess of foot excluding toe 01/13/2015   Diabetes mellitus without complication (HCC) 01/13/2015   Leukocytosis 01/13/2015   Diabetes mellitus with peripheral vascular disease (HCC) 12/19/2007   DYSLIPIDEMIA 12/19/2007   ANEMIA 12/19/2007   ANXIETY 12/19/2007   Essential hypertension 12/19/2007   TRANSIENT ISCHEMIC ATTACK 12/19/2007   PERIPHERAL VASCULAR DISEASE 12/19/2007   RENAL FAILURE 12/19/2007   Past Medical History:  Past Medical History:  Diagnosis Date   Anxiety    COVID-19  virus infection 09/2020   CPAP (continuous positive airway pressure) dependence    Diabetes mellitus without complication (HCC)    GERD (gastroesophageal reflux disease)    Hypertension    Legally blind in left eye, as defined in [place]    PTSD (post-traumatic stress disorder)    PVD (peripheral vascular disease) (HCC) 01/17/2015   Renal disorder    Sleep apnea    Thyroid disease    Past Surgical History:  Past Surgical History:  Procedure Laterality Date   ABDOMINAL AORTAGRAM N/A 01/21/2015   Procedure: ABDOMINAL Ronny Flurry;  Surgeon: Chuck Hint, MD;  Location: [facility] CATH LAB;  Service: Cardiovascular;  Laterality: N/A;   AMPUTATION Left 01/23/2015   Procedure: FIRST RAY AMPUTATION/LEFT FOOT;  Surgeon: Nadara Mustard, MD;  Location: [facility] OR;  Service: Orthopedics;  Laterality: Left;   AMPUTATION Left 02/20/2015   Procedure: AMPUTATION BELOW KNEE;  Surgeon: Nadara Mustard, MD;  Location: [facility] OR;  Service: Orthopedics;  Laterality: Left;   AV FISTULA PLACEMENT     BALLOON DILATION N/A 02/26/2021   Procedure: BALLOON DILATION;  Surgeon: Hilarie Fredrickson, MD;  Location[facility] ENDOSCOPY;  Service: Endoscopy;  Laterality: N/A;   BIOPSY  02/26/2021   Procedure: BIOPSY;  Surgeon: Hilarie Fredrickson, MD;  Location: [facility] ENDOSCOPY;  Service: Endoscopy;;   COLONOSCOPY     COMBINED KIDNEY-PANCREAS TRANSPLANT Left 2009   ESOPHAGOGASTRODUODENOSCOPY (EGD) WITH PROPOFOL N/A 02/26/2021   Procedure: ESOPHAGOGASTRODUODENOSCOPY (EGD) WITH PROPOFOL;  Surgeon: Hilarie Fredrickson, MD;  Location: [facility] ENDOSCOPY;  Service: Endoscopy;  Laterality: N/A;   INCISION AND DRAINAGE ABSCESS Left 01/13/2015   Procedure: INCISION AND  DRAINAGE OF LEFT FOOT FIRST RAY ABCESS;  Surgeon: Kathryne Hitch, MD;  Location: [facility] OR;  Service: Orthopedics;  Laterality: Left;   LOWER EXTREMITY ANGIOGRAM Left 01/21/2015   Procedure: LOWER EXTREMITY ANGIOGRAM;  Surgeon: Chuck Hint, MD;  Location: [facility] CATH LAB;  Service: Cardiovascular;  Laterality:  Left;   HPI:  [name] is a 72 yo male presenting to ED 9/8 with HA and chills. Found to have ulceration first metatarsal head R foot s/p L foot first ray amputation and subsequent transtibial amputation 2016. Per note, endovascular evaluation with single-vessel runoff with occlusion of the anterior tibial and peroneal with plan for surgery 9/12. CXR unremarkable. Per RN note, pt with increased secretions and c/o difficulty swallowing. Pt experienced acute change when presenting for EGD 9/13 including significantly elevated blood pressure with diaphoresis. 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  Per SLP note 2022, pt with history of esophageal stricture without aspiration and recommended a softened diet for improved esophageal clearance. PMH includes renal and pancreatic transplant at [facility], DM, HTN, LLE BKA, obstructive sleep apnea, chronic R foot wound, GERD   Assessment / Plan / Recommendation Clinical Impression  Pt reports being independent at baseline, living at home with his wife and mother-in-law. He states he is retired from [employer], where he worked since 1979. Pt reports no acute concerns with cognition or speech since acute CVA. He states he has not been able to hold a pen to write since retiring and his glasses were not present this date during evaluation, making it difficult to complete portions of the SLUMS. Of the 24 remaining points, pt scored a 20 with mild deficits related to memory retrieval and problem solving. Overall, pt seems to demonstrate intact awareness and he reports some mild memory decline PTA. Suspect pt is at his baseline. No further SLP f/u is necessary acutely, although recommend increased assistance PRN upon d/c. Discussed potential for pursuing SLP f/u on an OP basis if experiencing functional deficits, with which pt and his wife verbalized agreement. SLP to s/o at this time.    SLP  Assessment  SLP Recommendation/Assessment: All further Speech Lanaguage Pathology  needs can be addressed in the next venue of care SLP Visit Diagnosis: Cognitive communication deficit (R41.841)    Recommendations for follow up therapy are one component of a multi-disciplinary discharge planning process, led by the attending physician.  Recommendations may be updated based on patient status, additional functional criteria and insurance authorization.    Follow Up Recommendations  Outpatient SLP    Assistance Recommended at Discharge  PRN  Functional Status Assessment Patient has not had a recent decline in their functional status  Frequency and Duration           SLP Evaluation Cognition  Overall Cognitive Status: Impaired/Different from baseline Arousal/Alertness: Awake/alert Orientation Level: Oriented X4 Attention: Sustained Sustained Attention: Appears intact Memory: Impaired Memory Impairment: Retrieval deficit Awareness: Appears intact Problem Solving: Impaired Problem Solving Impairment: Verbal basic       Comprehension  Auditory Comprehension Overall Auditory Comprehension: Appears within functional limits for tasks assessed    Expression Expression Primary Mode of Expression: Verbal Verbal Expression Overall Verbal Expression: Appears within functional limits for tasks assessed   Oral / Motor  Oral Motor/Sensory Function Overall Oral Motor/Sensory Function: Within functional limits Motor Speech Overall Motor Speech: Appears within functional limits for tasks assessed  Gwynneth Aliment, M.A., CF-SLP Speech Language Pathology, Acute Rehabilitation Services  Secure Chat preferred [phone]  06/14/2023, 3:45 PM

## 2023-06-14 NOTE — Progress Notes (Signed)
PT Cancellation Note  Patient Details Name: [name]. MRN: [id number] DOB: [birth year]   Cancelled Treatment:    Reason Eval/Treat Not Completed: Patient declined, no reason specified (Pt declined mobility this morning and requested that PTA come back in the afternoon. SLP currently with pt. Will continue to follow per PT POC.)   Johny Shock 06/14/2023, 3:45 PM

## 2023-06-14 NOTE — TOC CM/SW Note (Signed)
Transition of Care [facility]) - Inpatient Brief Assessment   Patient Details  Name: [name]. MRN: [id number] Date of Birth: 1951-07-21  [facility] CM/SW Contact:    Darrold Span, RN Phone Number: 06/14/2023, 4:06 PM   Clinical Narrative: Note plan for Aortogram with R leg runoff with possible intervention with vascular tomorrow.  Recommendations for HH at this time.  We will continue to monitor patient advancement through interdisciplinary progression rounds. If new patient transition needs arise, please place a TOC consult.   Transition of Care Asessment: Insurance and Status: Insurance coverage has been reviewed Patient has primary care physician: Yes Home environment has been reviewed: home w/ spouse   Prior/Current Home Services: No current home services Social Determinants of Health Reivew: SDOH reviewed no interventions necessary Readmission risk has been reviewed: Yes Transition of care needs: transition of care needs identified, TOC will continue to follow

## 2023-06-14 NOTE — Progress Notes (Signed)
Mobility Specialist Progress Note:   06/14/23 1620  Mobility  Activity Ambulated with assistance in room;Transferred from bed to chair  Level of Assistance Minimal assist, patient does 75% or more  Assistive Device Front wheel walker  Distance Ambulated (ft) 10 ft  Activity Response Tolerated well  Mobility Referral Yes  $Mobility charge 1 Mobility  Mobility Specialist Start Time (ACUTE ONLY) 1545  Mobility Specialist Stop Time (ACUTE ONLY) 1615  Mobility Specialist Time Calculation (min) (ACUTE ONLY) 30 min   During Mobility: 100 HR Post Mobility: 97 HR  Pt received in bed, agreeable to mobility. MinA to stand. CG during ambulation. Receptive to verbal cues for direction d/t poor vision. Pt able to take a couple steps around bed. C/o slight dizziness, otherwise asymptomatic throughout. Pt left in chair asymptomatic with call bell in reach and all needs met.   Leory Plowman  Mobility Specialist Please contact via [employer] office at [phone]

## 2023-06-14 NOTE — Progress Notes (Signed)
Speech Language Pathology Treatment: Dysphagia  Patient Details Name: [name]. MRN: [id number] DOB: [birth year] Today's Date: 06/14/2023 Time: 3474-2595 SLP Time Calculation (min) (ACUTE ONLY): 9 min  Assessment / Plan / Recommendation Clinical Impression  Pt currently on full liquid diet given GI symptoms, with which pt reports fluctuating performance. He states that he has been feeling nauseas and has not been able to eat/drink a significant amount. Given pt's performance during initial eval, suspect pt is experiencing esophageal dysphagia. Observed pt with sips of thin liquids with no overt s/s of dysphagia or aspiration. Oral motor exam WFL. Recommend initiating diet of regular textures with thin liquids once cleared to do so from a GI standpoint. No further SLP f/u is needed at this time since pt is not exhibiting signs of oropharyngeal dysphagia. Will s/o.    HPI HPI: [name] is a 72 yo male presenting to ED 9/8 with HA and chills. Found to have ulceration first metatarsal head R foot s/p L foot first ray amputation and subsequent transtibial amputation 2016. Per note, endovascular evaluation with single-vessel runoff with occlusion of the anterior tibial and peroneal with plan for surgery 9/12. CXR unremarkable. Per RN note, pt with increased secretions and c/o difficulty swallowing. Pt experienced acute change when presenting for EGD 9/13 including significantly elevated blood pressure with diaphoresis. 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  Per SLP note 2022, pt with history of esophageal stricture without aspiration and recommended a softened diet for improved esophageal clearance. PMH includes renal and pancreatic transplant at [facility], DM, HTN, LLE BKA, obstructive sleep apnea, chronic R foot wound, GERD      SLP Plan  All goals met  All further Speech Lanaguage Pathology  needs can be addressed  in the next venue of care   Recommendations for follow up therapy are one component of a multi-disciplinary discharge planning process, led by the attending physician.  Recommendations may be updated based on patient status, additional functional criteria and insurance authorization.    Recommendations  Diet recommendations: Regular;Thin liquid Liquids provided via: Cup;Straw Medication Administration: Whole meds with puree Supervision: Patient able to self feed;Intermittent supervision to cue for compensatory strategies Compensations: Slow rate;Small sips/bites Postural Changes and/or Swallow Maneuvers: Seated upright 90 degrees;Upright 30-60 min after meal                  Oral care BID   PRN Dysphagia, unspecified (R13.10)     All goals met     Gwynneth Aliment, M.A., CF-SLP Speech Language Pathology, Acute Rehabilitation Services  Secure Chat preferred [phone]   06/14/2023, 3:47 PM

## 2023-06-14 NOTE — Progress Notes (Signed)
PROGRESS NOTE [name].  QIO:[id number] DOB: 08/02/1951 DOA: 06/06/2023 PCP: [name], MD  Brief Narrative/Hospital Course: 72yom w/ history of renal/pancreatic transplant at [facility], DM 2, HTN, left leg BKA, OSA, chronic right foot wound woke up on the day of admission with chills.  In the ED he was started on broad-spectrum antibiotics and admitted due to concerns of possible foot infection, Dr. Lajoyce Corners consulted.  MRI of the foot was consistent with osteomyelitis.  ABI performed showing PVD. Due to persistent nausea, angio postponed. EGD planned but prior to EGD, patient was noted to have significantly elevated blood pressure with diaphoresis, acute right blurry vision- therefore was canceled and workup revealed patient has acute CVA, neurology consulted. 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  MRA head without significant finding.  Carotid Doppler unremarkable, echo with EF 70-75% LDL 76 A1c 9.1.  Neurology recommending continue home aspirin and pending clearance from GI and vascular to start DAPT x 3 weeks followed by aspirin alone.  Patient continued on vancomycin and cefepime for right foot osteomyelitis.    Subjective: Seen and examined Resting well, no nauseas or vomiting and no complaints  Overnight slight tachycardia otherwise stable blood pressure on room air afebrile Labs reviewed mild hyponatremia 133 blood sugar 210 anion gap 17 magnesium 1.6 CBC with chronic anemia microcytic at 10.1 g Vascular planning for aortogram with right leg runoff and possible intervention 9/17  Assessment and Plan: Principal Problem:   Osteomyelitis of great toe of right foot (HCC) Active Problems:   PAD (peripheral artery disease) (HCC)   S/P BKA (below knee amputation) unilateral (HCC)   OSA on CPAP   History of simultaneous kidney and pancreas transplant (HCC)   Sepsis due to acute on chronic right foot ulcer with osteomyelitis  PVD  : MRI positive for osteomyelitis.ABI with PVD. LE Angio postponed in the setting of dysphagia and nausea.  Managing with IV cefepime, vancomycin, orthopedic vascular surgery following closely on aspirin and statin, planning for right leg runoff 9/17.   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  Doubt correlation between symptoms and CVA.  Blurry vision could be due to retinopathy, hypoglycemia and possible glaucoma. MRA head without significant finding.  Carotid Doppler unremarkable, echo with EF 70-75% LDL 76 A1c 9.1.  Neurology recommending continue home aspirin and pending clearance from GI and vascular to start DAPT x 3 weeks followed by aspirin alone  Intermittent nausea and diaphoresis: unclear etiology, but suspecting gastroparesis.Other rare possibilities include pheochromocytoma given associated blood pressure and tachycardia.  Has no chest pain or respiratory symptoms.  Denies abdominal pain.  EKG without acute ischemic finding.  Continue on his scheduled Reglan every 6 hour> will wean to q8 hr post angio. follow-up plasma metanephrine.  GI has seen the patient and signed off.  Continue PPI twice daily Zofran   Uncontrolled IDDM-2 with retinopathy, right foot wound infection and left BKA:  A1c uncontrolled 11.1, continue on SSI due to labile/brittle diabetes and monitor as below Recent Labs  Lab 06/12/23 2044 06/13/23 0607 06/13/23 1129 06/13/23 2026 06/14/23 0605  GLUCAP 167* 170* 189* 264* 202*    History of pancreatic/renal transplant, 2008 Patient will be continued on his home CellCept and tacrolimus.  Follows mostly at the [facility] with Dr. Arrie Aran   Chronic left-sided blindness: No significant finding on MRI orbit.   Essential hypertension,  uncontrolled: Currently well-controlled blood pressure.  Continue losartan low-dose amlodipine prn hydralazine    Hyperlipidemia Continue Lipitor   OSA: Will need outpatient sleep study.  DVT prophylaxis: enoxaparin (LOVENOX) injection 40 mg Start: 06/11/23 1000 Code Status:   Code Status: Full Code Family Communication: plan of care discussed with patient at bedside. Patient status is: Inpatient because of his right foot osteomyelitis Level of care: Progressive   Dispo: The patient is from: home            Anticipated disposition: TBD  Objective: Vitals last 24 hrs: Vitals:   06/14/23 0145 06/14/23 0505 06/14/23 0622 06/14/23 0845  BP: (!) 115/51 (!) 170/72 (!) 133/54 131/62  Pulse: 80 92 80 86  Resp: 19  15 15   Temp:  98.6 F (37 C)  98.2 F (36.8 C)  TempSrc:  Oral  Oral  SpO2: 98% 98% 98% 100%  Weight:      Height:       Weight change:   Physical Examination: General exam: alert awake, older than stated age HEENT:Oral mucosa moist, Ear/Nose WNL grossly Respiratory system: bilaterally clear BS, no use of accessory muscle Cardiovascular system: S1 & S2 +, No JVD. Gastrointestinal system: Abdomen soft,NT,ND, BS+ Nervous System:Alert, awake, moving extremities. Extremities: LE edema neg, Lt BKA old Skin: No rashes,no icterus. MSK: Normal muscle bulk,tone, power  Medications reviewed:  Scheduled Meds:  amLODipine  5 mg Oral Daily   ascorbic acid  250 mg Oral Q M,W,F   aspirin EC  81 mg Oral Daily   atorvastatin  80 mg Oral QHS   enoxaparin (LOVENOX) injection  40 mg Subcutaneous Q24H   furosemide  20 mg Oral Daily   insulin aspart  0-6 Units Subcutaneous TID WC   leptospermum manuka honey  1 Application Topical Daily   losartan  50 mg Oral Daily   metoCLOPramide (REGLAN) injection  5 mg Intravenous TID AC & HS   mycophenolate  1,000 mg Oral BID   pantoprazole (PROTONIX) IV  40 mg Intravenous Q12H   potassium chloride SA  20 mEq Oral Daily   tacrolimus  5 mg Oral BID   Vitamin D (Ergocalciferol)  50,000 Units Oral Q Wed   Continuous Infusions:  ceFEPime (MAXIPIME) IV  2 g (06/14/23 0143)   vancomycin 1,000 mg (06/14/23 0309)      Diet Order             Diet NPO time specified  Diet effective midnight           Diet full liquid Room service appropriate? Yes; Fluid consistency: Thin  Diet effective now                   Intake/Output Summary (Last 24 hours) at 06/14/2023 1036 Last data filed at 06/14/2023 0146 Gross per 24 hour  Intake --  Output 525 ml  Net -525 ml   Net IO Since Admission: -512.76 mL [06/14/23 1036]  Wt Readings from Last 3 Encounters:  06/11/23 72.6 kg  02/27/21 71.9 kg  12/04/20 77.7 kg     Unresulted Labs (From admission, onward)     Start     Ordered   06/14/23 1000  Sedimentation rate  Once,   R        06/14/23 1000   06/13/23 1209  Metanephrines, plasma  Once,   R       Question:  Specimen collection method  Answer:  Lab=Lab collect   06/13/23 1210   06/11/23 2209  Rapid urine drug screen (hospital performed)  ONCE - STAT,   STAT        06/11/23 2208   06/11/23 0828  Culture, blood (Routine X 2) w Reflex to ID Panel  BLOOD CULTURE X 2,   R      06/11/23 0827          Data Reviewed: I have personally reviewed following labs and imaging studies CBC: Recent Labs  Lab 06/10/23 0347 06/11/23 0935 06/12/23 0336 06/13/23 0456 06/14/23 0346  WBC 15.8* 15.9* 13.2* 11.4* 11.3*  HGB 13.1 12.9* 12.0* 10.8* 10.1*  HCT 39.5 39.5 36.3* 33.3* 29.9*  MCV 79.6* 81.3 80.5 78.4* 77.7*  PLT 237 279 260 230 221   Basic Metabolic Panel: Recent Labs  Lab 06/10/23 0347 06/11/23 0935 06/12/23 0336 06/13/23 0456 06/14/23 0346  NA 135 136 134* 132* 133*  K 3.6 3.6 3.2* 4.0 4.5  CL 103 106 104 106 106  CO2 19* 16* 19* 18* 17*  GLUCOSE 124* 228* 93 195* 210*  BUN 17 25* 26* 21 20  CREATININE 0.93 1.06 1.19 1.03 1.06  CALCIUM 9.0 9.2 9.2 8.8* 8.6*  MG 2.2 2.0 2.1 1.8 1.6*   GFR: Estimated Creatinine Clearance: 66.6 mL/min (by C-G formula based on SCr of 1.06 mg/dL). Liver Function Tests: Recent Labs  Lab  06/13/23 1232  AST 15  ALT 15  ALKPHOS 80  BILITOT 1.1  PROT 7.0  ALBUMIN 3.4*   Recent Labs  Lab 06/13/23 1232  LIPASE 22  CBG: Recent Labs  Lab 06/12/23 2044 06/13/23 0607 06/13/23 1129 06/13/23 2026 06/14/23 0605  GLUCAP 167* 170* 189* 264* 202*   Lipid Profile: Recent Labs    06/12/23 0336  CHOL 130  HDL 23*  LDLCALC 76  TRIG 865*  CHOLHDL 5.7   Thyroid Function Tests: No results for input(s): "TSH", "T4TOTAL", "FREET4", "T3FREE", "THYROIDAB" in the last 72 hours. Sepsis Labs: Recent Labs  Lab 06/07/23 1712 06/11/23 0935 06/11/23 1258 06/11/23 1910 06/13/23 1320 06/13/23 1518  PROCALCITON <0.10  --   --   --   --   --   LATICACIDVEN  --    < > 2.3* 1.5 1.8 1.1   < > = values in this interval not displayed.    Recent Results (from the past 240 hour(s))  SARS Coronavirus 2 by RT PCR (hospital order, performed in [facility] hospital lab) *cepheid single result test* Anterior Nasal Swab     Status: None   Collection Time: 06/06/23  3:47 PM   Specimen: Anterior Nasal Swab  Result Value Ref Range Status   SARS Coronavirus 2 by RT PCR NEGATIVE NEGATIVE Final    Comment: Performed at [facility] Lab, [address], [place], [place] [address]  Blood culture (routine x 2)     Status: None   Collection Time: 06/06/23  6:15 PM   Specimen: BLOOD LEFT ARM  Result Value Ref Range Status   Specimen Description BLOOD LEFT ARM  Final   Special Requests   Final    BOTTLES DRAWN AEROBIC AND ANAEROBIC Blood Culture adequate volume   Culture   Final    NO GROWTH 5 DAYS Performed at [facility], [address], [place], [place] [address]    Report Status 06/11/2023 FINAL  Final  Blood culture (routine x 2)     Status: None   Collection Time: 06/06/23  6:15 PM   Specimen: BLOOD LEFT HAND  Result Value Ref Range  Status   Specimen Description BLOOD LEFT HAND  Final   Special Requests   Final    BOTTLES DRAWN AEROBIC AND ANAEROBIC Blood Culture adequate  volume   Culture   Final    NO GROWTH 5 DAYS Performed at [facility], [address] [place], [place] [address]    Report Status 06/11/2023 FINAL  Final  Urine Culture     Status: None   Collection Time: 06/06/23  6:34 PM   Specimen: Urine, Clean Catch  Result Value Ref Range Status   Specimen Description URINE, CLEAN CATCH  Final   Special Requests NONE  Final   Culture   Final    NO GROWTH Performed at [facility], [address] [place], [place] [address]    Report Status 06/07/2023 FINAL  Final  Surgical PCR screen     Status: None   Collection Time: 06/09/23  3:59 AM   Specimen: Nasal Mucosa; Nasal Swab  Result Value Ref Range Status   MRSA, PCR NEGATIVE NEGATIVE Final   Staphylococcus aureus NEGATIVE NEGATIVE Final    Comment: (NOTE) The Xpert SA Assay (FDA approved for NASAL specimens in patients 17 years of age and older), is one component of a comprehensive surveillance program. It is not intended to diagnose infection nor to guide or monitor treatment. Performed at [facility], [address] [place], [place] [address]   Culture, blood (Routine X 2) w Reflex to ID Panel     Status: None (Preliminary result)   Collection Time: 06/11/23  9:35 AM   Specimen: BLOOD LEFT WRIST  Result Value Ref Range Status   Specimen Description BLOOD LEFT WRIST  Final   Special Requests   Final    BOTTLES DRAWN AEROBIC ONLY Blood Culture adequate volume   Culture   Final    NO GROWTH 3 DAYS Performed at [facility], [address] [place], [place] [address]    Report Status PENDING  Incomplete    Antimicrobials: Anti-infectives (From admission, onward)    Start     Dose/Rate Route Frequency Ordered Stop   06/12/23 1530  vancomycin (VANCOCIN) IVPB 1000 mg/200 mL premix        1,000 mg 200 mL/hr over 60 Minutes Intravenous Every 12 hours 06/12/23 1439     06/09/23 0945  ceFAZolin (ANCEF) IVPB 2g/100 mL premix        2 g 200 mL/hr over 30  Minutes Intravenous On call to O.R. 06/09/23 0857 06/10/23 0559   06/08/23 1300  vancomycin (VANCOREADY) IVPB 1500 mg/300 mL  Status:  Discontinued        1,500 mg 150 mL/hr over 120 Minutes Intravenous Every 24 hours 06/08/23 0904 06/12/23 1439   06/08/23 1000  vancomycin (VANCOREADY) IVPB 1750 mg/350 mL  Status:  Discontinued        1,750 mg 175 mL/hr over 120 Minutes Intravenous  Once 06/07/23 1342 06/08/23 0904   06/08/23 0915  ceFEPIme (MAXIPIME) 2 g in sodium chloride 0.9 % 100 mL IVPB        2 g 200 mL/hr over 30 Minutes Intravenous Every 8 hours 06/08/23 0901     06/07/23 1215  vancomycin (VANCOREADY) IVPB 1500 mg/300 mL        1,500 mg 150 mL/hr over 120 Minutes Intravenous  Once 06/07/23 1209 06/07/23 1501   06/07/23 1215  ceFEPIme (MAXIPIME) 2 g in sodium chloride 0.9 % 100 mL IVPB  Status:  Discontinued  2 g 200 mL/hr over 30 Minutes Intravenous 2 times daily 06/07/23 1209 06/08/23 0901   06/06/23 2300  piperacillin-tazobactam (ZOSYN) IVPB 3.375 g  Status:  Discontinued       Note to Pharmacy: Please dose   3.375 g 12.5 mL/hr over 240 Minutes Intravenous Every 8 hours 06/06/23 2244 06/07/23 1151   06/06/23 2100  ceFEPIme (MAXIPIME) 2 g in sodium chloride 0.9 % 100 mL IVPB        2 g 200 mL/hr over 30 Minutes Intravenous  Once 06/06/23 2055 06/06/23 2302      Culture/Microbiology    Component Value Date/Time   SDES BLOOD LEFT WRIST 06/11/2023 0935   SPECREQUEST  06/11/2023 0935    BOTTLES DRAWN AEROBIC ONLY Blood Culture adequate volume   CULT  06/11/2023 0935    NO GROWTH 3 DAYS Performed at [facility], [address] [place], [place] [address]    REPTSTATUS PENDING 06/11/2023 0935     Radiology Studies: VAS US CAROTID (at [facility] and [facility] only)  Result Date: 06/13/2023 Carotid Arterial Duplex Study Patient Name:  Battista Winkelmann.  Date of Exam:   06/13/2023 Medical Rec #: [id number]           Accession #:    [id number] Date of Birth: 11-26-1950            Patient Gender: M Patient Age:   84 years Exam Location:  [facility] Procedure:      VAS US CAROTID Referring Phys: Dewitt Hoes DE LA TORRE --------------------------------------------------------------------------------  Indications:      CVA. Risk Factors:     Hypertension, hyperlipidemia, Diabetes, past history of                   smoking. Other Factors:    PAD, renal failure s/p transplant,. Comparison Study: No previous exams Performing Technologist: Jody Hill RVT, RDMS  Examination Guidelines: A complete evaluation includes B-mode imaging, spectral Doppler, color Doppler, and power Doppler as needed of all accessible portions of each vessel. Bilateral testing is considered an integral part of a complete examination. Limited examinations for reoccurring indications may be performed as noted.  Right Carotid Findings: +----------+-------+--------+--------+-----------------------+-----------------+           PSV    EDV cm/sStenosisPlaque Description     Comments                    cm/s                                                            +----------+-------+--------+--------+-----------------------+-----------------+ CCA Prox  86     0               heterogenous           intimal                                                                   thickening        +----------+-------+--------+--------+-----------------------+-----------------+ CCA Distal78     0  heterogenous           intimal                                                                   thickening        +----------+-------+--------+--------+-----------------------+-----------------+ ICA Prox  59     12              calcific and                                                              heterogenous                             +----------+-------+--------+--------+-----------------------+-----------------+ ICA Distal42     8                                                         +----------+-------+--------+--------+-----------------------+-----------------+ ECA       188    0               heterogenous and                                                          calcific                                 +----------+-------+--------+--------+-----------------------+-----------------+ +----------+--------+-------+-------------+-------------------+           PSV cm/sEDV cmsDescribe     Arm Pressure (mmHG) +----------+--------+-------+-------------+-------------------+ [id number]     250    HD access RUE                    +----------+--------+-------+-------------+-------------------+ +---------+--------+--+--------+-+----------------------------+ VertebralPSV cm/s47EDV cm/s0Antegrade and High resistant +---------+--------+--+--------+-+----------------------------+  Left Carotid Findings: +----------+-------+--------+--------+-----------------------+-----------------+           PSV    EDV cm/sStenosisPlaque Description     Comments                    cm/s                                                            +----------+-------+--------+--------+-----------------------+-----------------+ CCA Prox  159    8               heterogenous           intimal  thickening        +----------+-------+--------+--------+-----------------------+-----------------+ CCA Distal137    23              calcific and           intimal                                            heterogenous           thickening        +----------+-------+--------+--------+-----------------------+-----------------+ ICA Prox  66     13              calcific and                                                              heterogenous                             +----------+-------+--------+--------+-----------------------+-----------------+ ICA  Distal72     18                                                       +----------+-------+--------+--------+-----------------------+-----------------+ ECA       127    0               calcific and                                                              heterogenous                             +----------+-------+--------+--------+-----------------------+-----------------+ +----------+--------+--------+----------------+-------------------+           PSV cm/sEDV cm/sDescribe        Arm Pressure (mmHG) +----------+--------+--------+----------------+-------------------+ [id number]             Multiphasic, WNL                    +----------+--------+--------+----------------+-------------------+ +---------+--------+--+--------+--+---------+ VertebralPSV cm/s57EDV cm/s10Antegrade +---------+--------+--+--------+--+---------+   Summary: Right Carotid: Velocities in the right ICA are consistent with a 1-39% stenosis. Left Carotid: Velocities in the left ICA are consistent with a 1-39% stenosis. Vertebrals:  Left vertebral artery demonstrates antegrade flow. Right vertebral              artery demonstrates high resistant flow. Subclavians: Right subclavian artery flow was disturbed, history of HD access in              RUE. Normal flow hemodynamics were seen in the left subclavian              artery. *See table(s) above for measurements and observations.  Electronically signed by Sherald Hess MD on 06/13/2023 at 4:19:49 PM.    Final    MR ANGIO  HEAD WO CONTRAST  Result Date: 06/12/2023 CLINICAL DATA:  Stroke/TIA EXAM: MRA HEAD WITHOUT CONTRAST TECHNIQUE: Angiographic images of the Circle of Willis were acquired using MRA technique without intravenous contrast. COMPARISON:  None Available. FINDINGS: POSTERIOR CIRCULATION: --Vertebral arteries: Normal --Inferior cerebellar arteries: Normal. --Basilar artery: Normal. --Superior cerebellar arteries: Normal. --Posterior  cerebral arteries: Mild atherosclerotic irregularity without high-grade stenosis. ANTERIOR CIRCULATION: --Intracranial internal carotid arteries: Normal. --Anterior cerebral arteries (ACA): Normal. --Middle cerebral arteries (MCA): Normal. Other: None. IMPRESSION: Normal intracranial MRA. Electronically Signed   By: Deatra Robinson M.D.   On: 06/12/2023 22:49     LOS: 8 days   Lanae Boast, MD [facility]  06/14/2023, 10:36 AM

## 2023-06-15 ENCOUNTER — Encounter (HOSPITAL_COMMUNITY)
Admission: EM | Disposition: A | Discharge status: Rehabilitation Facility | Payer: Self-pay | Source: Home / Self Care | Attending: Internal Medicine

## 2023-06-15 DIAGNOSIS — I70235 Atherosclerosis of native arteries of right leg with ulceration of other part of foot: Secondary | ICD-10-CM

## 2023-06-15 DIAGNOSIS — M869 Osteomyelitis, unspecified: Secondary | ICD-10-CM | POA: Diagnosis not present

## 2023-06-15 HISTORY — PX: PERIPHERAL VASCULAR BALLOON ANGIOPLASTY: CATH118281

## 2023-06-15 HISTORY — PX: ABDOMINAL AORTOGRAM W/LOWER EXTREMITY: CATH118223

## 2023-06-15 LAB — CBC
HCT: 37.3 % — ABNORMAL LOW (ref 39.0–52.0)
Hemoglobin: 12.4 g/dL — ABNORMAL LOW (ref 13.0–17.0)
MCH: 26.5 pg (ref 26.0–34.0)
MCHC: 33.2 g/dL (ref 30.0–36.0)
MCV: 79.7 fL — ABNORMAL LOW (ref 80.0–100.0)
Platelets: 212 10*3/uL (ref 150–400)
RBC: 4.68 MIL/uL (ref 4.22–5.81)
RDW: 14.4 % (ref 11.5–15.5)
WBC: 12.9 10*3/uL — ABNORMAL HIGH (ref 4.0–10.5)
nRBC: 0 % (ref 0.0–0.2)

## 2023-06-15 LAB — BASIC METABOLIC PANEL
Anion gap: 11 (ref 5–15)
BUN: 16 mg/dL (ref 8–23)
CO2: 19 mmol/L — ABNORMAL LOW (ref 22–32)
Calcium: 9.3 mg/dL (ref 8.9–10.3)
Chloride: 104 mmol/L (ref 98–111)
Creatinine, Ser: 1.16 mg/dL (ref 0.61–1.24)
GFR, Estimated: >60 mL/min (ref >60)
Glucose, Bld: 149 mg/dL — ABNORMAL HIGH (ref 70–99)
Potassium: 4 mmol/L (ref 3.5–5.1)
Sodium: 134 mmol/L — ABNORMAL LOW (ref 135–145)

## 2023-06-15 LAB — GLUCOSE, CAPILLARY
Glucose-Capillary: 171 mg/dL — ABNORMAL HIGH (ref 70–99)
Glucose-Capillary: 85 mg/dL (ref 70–99)
Glucose-Capillary: 144 mg/dL — ABNORMAL HIGH (ref 70–99)
Glucose-Capillary: 193 mg/dL — ABNORMAL HIGH (ref 70–99)

## 2023-06-15 SURGERY — ABDOMINAL AORTOGRAM W/LOWER EXTREMITY
Anesthesia: LOCAL | Laterality: Right

## 2023-06-15 MED ORDER — SODIUM CHLORIDE 0.9 % IV SOLN: 250.0000 mL | INTRAVENOUS | Status: DC | PRN

## 2023-06-15 MED ORDER — ONDANSETRON HCL 4 MG/2ML IJ SOLN: 4.0000 mg | Freq: Four times a day (QID) | INTRAMUSCULAR | Status: DC | PRN

## 2023-06-15 MED ORDER — LIDOCAINE HCL (PF) 1 % IJ SOLN
INTRAMUSCULAR | Status: DC | PRN
  Administered 2023-06-15: 15 mL

## 2023-06-15 MED ORDER — HYDRALAZINE HCL 20 MG/ML IJ SOLN: 5.0000 mg | INTRAMUSCULAR | Status: DC | PRN

## 2023-06-15 MED ORDER — ASPIRIN 81 MG PO TBEC: 81.0000 mg | DELAYED_RELEASE_TABLET | Freq: Every day | ORAL | Status: DC

## 2023-06-15 MED ORDER — SODIUM CHLORIDE 0.9% FLUSH: 3.0000 mL | INTRAVENOUS | Status: DC | PRN

## 2023-06-15 MED ORDER — HEPARIN (PORCINE) IN NACL 1000-0.9 UT/500ML-% IV SOLN
INTRAVENOUS | Status: DC | PRN
  Administered 2023-06-15 (×2): 500 mL

## 2023-06-15 MED ORDER — MIDAZOLAM HCL 2 MG/2ML IJ SOLN
INTRAMUSCULAR | Status: AC
  Filled 2023-06-15: qty 2

## 2023-06-15 MED ORDER — SODIUM CHLORIDE 0.9 % IV SOLN
2.0000 g | Freq: Two times a day (BID) | INTRAVENOUS | Status: DC
  Administered 2023-06-15 – 2023-06-18 (×6): 2 g via INTRAVENOUS
  Filled 2023-06-15 (×6): qty 12.5

## 2023-06-15 MED ORDER — LABETALOL HCL 5 MG/ML IV SOLN
10.0000 mg | INTRAVENOUS | Status: DC | PRN
  Administered 2023-06-16: 10 mg via INTRAVENOUS
  Filled 2023-06-15: qty 4

## 2023-06-15 MED ORDER — ACETAMINOPHEN 325 MG PO TABS: 650.0000 mg | ORAL_TABLET | ORAL | Status: DC | PRN

## 2023-06-15 MED ORDER — FENTANYL CITRATE (PF) 100 MCG/2ML IJ SOLN
INTRAMUSCULAR | Status: DC | PRN
  Administered 2023-06-15 (×2): 25 ug via INTRAVENOUS
  Administered 2023-06-15: 50 ug via INTRAVENOUS

## 2023-06-15 MED ORDER — SODIUM CHLORIDE 0.9 % WEIGHT BASED INFUSION
1.0000 mL/kg/h | INTRAVENOUS | Status: AC
  Administered 2023-06-15: 1 mL/kg/h via INTRAVENOUS

## 2023-06-15 MED ORDER — SODIUM CHLORIDE 0.9% FLUSH
3.0000 mL | Freq: Two times a day (BID) | INTRAVENOUS | Status: DC
  Administered 2023-06-16 – 2023-06-29 (×20): 3 mL via INTRAVENOUS

## 2023-06-15 MED ORDER — LIDOCAINE HCL (PF) 1 % IJ SOLN
INTRAMUSCULAR | Status: AC
  Filled 2023-06-15: qty 30

## 2023-06-15 MED ORDER — IODIXANOL 320 MG/ML IV SOLN
INTRAVENOUS | Status: DC | PRN
  Administered 2023-06-15: 100 mL

## 2023-06-15 MED ORDER — SODIUM CHLORIDE 0.9 % IV SOLN: INTRAVENOUS | Status: DC

## 2023-06-15 MED ORDER — HEPARIN SODIUM (PORCINE) 1000 UNIT/ML IJ SOLN
INTRAMUSCULAR | Status: AC
  Filled 2023-06-15: qty 10

## 2023-06-15 MED ORDER — CLOPIDOGREL BISULFATE 75 MG PO TABS
75.0000 mg | ORAL_TABLET | Freq: Every day | ORAL | Status: DC
  Administered 2023-06-16 – 2023-06-29 (×13): 75 mg via ORAL
  Filled 2023-06-15 (×13): qty 1

## 2023-06-15 MED ORDER — MIDAZOLAM HCL 2 MG/2ML IJ SOLN
INTRAMUSCULAR | Status: DC | PRN
  Administered 2023-06-15 (×2): 1 mg via INTRAVENOUS
  Administered 2023-06-15: 2 mg via INTRAVENOUS

## 2023-06-15 MED ORDER — FENTANYL CITRATE (PF) 100 MCG/2ML IJ SOLN
INTRAMUSCULAR | Status: AC
  Filled 2023-06-15: qty 2

## 2023-06-15 MED ORDER — HEPARIN SODIUM (PORCINE) 1000 UNIT/ML IJ SOLN
INTRAMUSCULAR | Status: DC | PRN
  Administered 2023-06-15: 8000 [IU] via INTRAVENOUS

## 2023-06-15 SURGICAL SUPPLY — 17 items
BALLN STERLING OTW 3X100X150 (BALLOONS) ×2
BALLOON STERLING OTW 3X100X150 (BALLOONS) IMPLANT
CATH NAVICROSS ST .035X90CM (MICROCATHETER) IMPLANT
CATH OMNI FLUSH 5F 65CM (CATHETERS) IMPLANT
CATH SOS OMNI O 5F 80CM (CATHETERS) IMPLANT
COVER DOME SNAP 22 D (MISCELLANEOUS) IMPLANT
DEVICE VASC CLSR CELT ART 5 (Vascular Products) IMPLANT
KIT MICROPUNCTURE NIT STIFF (SHEATH) IMPLANT
SET ATX-X65L (MISCELLANEOUS) IMPLANT
SHEATH CATAPULT 5FR 60 (SHEATH) IMPLANT
SHEATH PINNACLE 5F 10CM (SHEATH) IMPLANT
SHEATH PROBE COVER 6X72 (BAG) IMPLANT
TRAY PV CATH (CUSTOM PROCEDURE TRAY) ×2 IMPLANT
WIRE BENTSON .035X145CM (WIRE) IMPLANT
WIRE G V18X300CM (WIRE) IMPLANT
WIRE HI TORQ VERSACORE 300 (WIRE) IMPLANT
WIRE ROSEN-J .035X180CM (WIRE) IMPLANT

## 2023-06-15 NOTE — Progress Notes (Signed)
OT Cancellation Note  Patient Details Name: [name]. MRN: [id number] DOB: [birth year]   Cancelled Treatment:    Reason Eval/Treat Not Completed:  (Pt requested no therapy untill after procedure at this time) Presley Raddle OTR/L  Acute Rehab Services  [phone] office number   Alphia Moh 06/15/2023, 8:49 AM

## 2023-06-15 NOTE — Progress Notes (Signed)
PT Cancellation Note  Patient Details Name: [name]. MRN: [id number] DOB: [birth year]   Cancelled Treatment:    Reason Eval/Treat Not Completed: (P) Patient at procedure or test/unavailable (Pt on bedrest after procedure. Will follow up as able.)   Johny Shock 06/15/2023, 2:15 PM

## 2023-06-15 NOTE — Progress Notes (Addendum)
Pharmacy Antibiotic Note- follow up  [name]. is a 72 y.o. male admitted on 06/06/2023 with right foot ulcer with osteomyelitis.  Pharmacy has been consulted for cefepime and vancomycin dosing.  Cr up slightly. Planning for vascular angiography today. Will repeat VT later this week.   Plan: Continue vancomycin 1000mg  IV q12h Adjust cefepime 2g IV q12h   Height: 5\' 10"  (177.8 cm) Weight: 67.9 kg (149 lb 11.1 oz) IBW/kg (Calculated) : 73  Temp (24hrs), Avg:98.5 F (36.9 C), Min:97.4 F (36.3 C), Max:99.1 F (37.3 C)  Recent Labs  Lab 06/11/23 0935 06/11/23 1258 06/11/23 1910 06/12/23 0336 06/12/23 1336 06/13/23 0456 06/13/23 1320 06/13/23 1518 06/14/23 0346 06/15/23 0404  WBC 15.9*  --   --  13.2*  --  11.4*  --   --  11.3* 12.9*  CREATININE 1.06  --   --  1.19  --  1.03  --   --  1.06 1.16  LATICACIDVEN 1.2 2.3* 1.5  --   --   --  1.8 1.1  --   --   VANCOTROUGH  --   --   --   --  12*  --   --   --   --   --     Estimated Creatinine Clearance: 56.9 mL/min (by C-G formula based on SCr of 1.16 mg/dL).    No Known Allergies   Microbiology results: 9/13 Bcx ngtd  9/8 BCx: neg 9/8 UCx: neg   Fredonia Highland, PharmD, BCPS, [facility] Clinical Pharmacist [phone] Please check AMION for all [facility] Pharmacy numbers 06/15/2023

## 2023-06-15 NOTE — Op Note (Signed)
Patient name: [name]. MRN: [id number] DOB: 1951-07-26 Sex: male  06/15/2023 Pre-operative Diagnosis: Right leg ulcer Post-operative diagnosis:  Same Surgeon:  Durene Cal Procedure Performed:  1.  Ultrasound-guided access, left femoral artery  2.  Abdominal aortogram with bifemoral runoff  3.  Right leg angiogram  4.  Selective injection with catheter in right superficial femoral artery  5.  Selective injection with catheter in right anterior tibial artery  6.  Angioplasty, right anterior tibial artery  7.  Conscious sedation, 87 minutes  8.  Closure device, Celt   Indications: This is a 72 year old gentleman with nonhealing right leg wound who comes in today for angiographic evaluation  Procedure:  The patient was identified in the holding area and taken to room 8.  The patient was then placed supine on the table and prepped and draped in the usual sterile fashion.  A time out was called.  Conscious sedation was administered with the use of IV fentanyl and Versed under continuous physician and nurse monitoring.  Heart rate, blood pressure, and oxygen saturation were continuously monitored.  Total sedation time was 87 minutes.  Ultrasound was used to evaluate the left common femoral artery.  It was patent .  A digital ultrasound image was acquired.  A micropuncture needle was used to access the left common femoral artery under ultrasound guidance.  An 018 wire was advanced without resistance and a micropuncture sheath was placed.  The 018 wire was removed and a benson wire was placed.  The micropuncture sheath was exchanged for a 5 french sheath.  An omniflush catheter was advanced over the wire to the level of L-1.  An abdominal angiogram was obtained.  Next, using the omniflush catheter and a benson wire, the aortic bifurcation was crossed and the catheter was placed into theright external iliac artery and right runoff was obtained.   Findings:   Aortogram: No significant  aortic stenosis was visualized.  Bilateral common and external iliac arteries were widely patent without significant stenosis.  Bilateral common femoral arteries widely patent without significant stenosis.  Right Lower Extremity: The right common femoral, profundofemoral, and superficial femoral artery are widely patent.  The right popliteal artery is widely patent.  There is single-vessel runoff via the anterior tibial artery.  There does appear to be a greater than 90% stenosis in the proximal anterior tibial artery and then it occludes just above the ankle.  Through collaterals, it reconstitutes the dorsalis pedis out onto the foot.  Diffuse pedal disease throughout the foot  Left Lower Extremity: Not evaluated (amputation)  Intervention: After the above images were acquired the decision was made to proceed with intervention.  A 5 French 60 cm sheath was advanced into the right superficial femoral artery.  In order to do this, I had to use a Navicross and a Rosen wire.  With the sheath in the superficial femoral artery, additional images were selected to better define the anatomy.  The patient was fully heparinized.  Using a V-18 wire and a 3 x 100 Sterling balloon, access into the anterior tibial artery was obtained.  I then performed selective injections with the catheter in the anterior tibial artery to define the occlusion.  After studying the distal anterior tibial occlusion and reconstitution out on the foot, I did not feel that there was a obvious pathway to connect the 2 vessels either from a pedal access or from the antegrade approach.  I tried to advance the V-18 wire to see  if it would select a path, however on subsequent imaging the wire had perforated the vessel.  I therefore aborted trying to treat the occlusion but then focused my attention to the proximal anterior tibial artery where there was a 90% stenosis.  This was treated using the 3 x 100 balloon.  Completion imaging showed perforation  of the artery.  I then reinserted the balloon and did a low inflation for 5 minutes.  On follow-up imaging the extravasation had essentially stopped.  At this point I decided to terminate the procedure.  I exchanged out for short sheath and closed the groin with a Celt.  Impression:  #1  No significant aortoiliac occlusive disease  #2  No significant right femoral-popliteal disease  #3  Single-vessel runoff via the anterior tibial artery that has a high-grade stenosis in the proximal portion and a distal occlusion for about 5 to 10 cm with reconstitution of the dorsalis pedis.  I was able to successfully treat the proximal stenosis however I could not cross the distal occlusion.  I did not see an obvious pathway connecting the dorsalis pedis and the distal anterior tibial artery therefore, pedal access was not attempted.  In addition, after angioplasty there was a significant perforation.  This was managed with a prolonged low pressure inflation with resolution.     Juleen China, M.D., [facility] [place] Office: [phone] Pager:  [phone]

## 2023-06-15 NOTE — Progress Notes (Signed)
PROGRESS NOTE Rip Harbour.  JXB:[id number] DOB: Oct 11, 1950 DOA: 06/06/2023 PCP: [name], MD  Brief Narrative/Hospital Course: 72yom w/ history of renal/pancreatic transplant at [facility], DM 2, HTN, left leg BKA, OSA, chronic right foot wound woke up on the day of admission with chills.  In the ED he was started on broad-spectrum antibiotics and admitted due to concerns of possible foot infection, Dr. Lajoyce Corners consulted.  MRI of the foot was consistent with osteomyelitis.  ABI performed showing PVD. Due to persistent nausea, angio postponed. EGD planned but prior to EGD, patient was noted to have significantly elevated blood pressure with diaphoresis, acute right blurry vision- therefore was canceled and workup revealed patient has acute CVA, neurology consulted. 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  MRA head without significant finding.  Carotid Doppler unremarkable, echo with EF 70-75% LDL 76 A1c 9.1.  Neurology recommending continue home aspirin and pending clearance from GI and vascular to start DAPT x 3 weeks followed by aspirin alone.  Patient continued on vancomycin and cefepime for right foot osteomyelitis.    Subjective: Patient seen and examined this morning Alert awake resting comfortably no ongoing nausea currently reports earlier around 2 to 3 AM had episode of vomiting.   Waiting for angiogram today Over night afebrile BP stable.  Assessment and Plan: Principal Problem:   Osteomyelitis of great toe of right foot (HCC) Active Problems:   PAD (peripheral artery disease) (HCC)   S/P BKA (below knee amputation) unilateral (HCC)   OSA on CPAP   History of simultaneous kidney and pancreas transplant (HCC)   Sepsis due to acute on chronic right foot ulcer with osteomyelitis  PVD : MRI positive for osteomyelitis.ABI with PVD.  Initially LE Angio postponed in the setting of dysphagia and nausea.Managing with IV cefepime,  vancomycin, orthopedic vascular surgery following closely on aspirin and statin, planning for right leg runoff 9/17.   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  Doubt correlation between symptoms and CVA.  Blurry vision could be due to retinopathy, hypoglycemia and possible glaucoma. MRA head without significant finding.  Carotid Doppler unremarkable, echo with EF 70-75% LDL 76 A1c 9.1. Neurology recommending continue home aspirin and pending clearance from GI and vascular to start DAPT x 3 weeks followed by aspirin alone.  Intermittent nausea and diaphoresis: unclear etiology, but suspecting gastroparesis.Other rare possibilities include pheochromocytoma given associated blood pressure and tachycardia.  Has no chest pain or respiratory symptoms.  Denies abdominal pain.  EKG without acute ischemic finding.  Continue on his scheduled Reglan every 6 hour> will wean to q8 hr post angio and wean off, continue Zofran as needed, PPI. follow-up plasma metanephrine-however his vitals remained stable.GI has seen the patient and signed off.     Uncontrolled IDDM-2 with retinopathy, right foot wound infection and left BKA:  A1c uncontrolled 11.1, blood sugar fairly stable at TIMES HIGH  Continue on SSI due to labile/brittle diabetes and monitor as below Recent Labs  Lab 06/13/23 2026 06/14/23 0605 06/14/23 1227 06/14/23 1621 06/15/23 0605  GLUCAP 264* 202* 218* 249* 171*    History of pancreatic/renal transplant, 2008 Continue his CellCept and tacrolimus.  Follows mostly at the [facility] with Dr. Arrie Aran   Chronic left-sided blindness: No significant finding on MRI orbit.   Essential hypertension, uncontrolled: Currently blood pressure fairly stable 120s to 170s systolic, continue losartan 50  mg, amlodipine 5 mg, prn hydralazine   Hyperlipidemia Continue Lipitor   OSA: Will need  outpatient sleep study.  DVT prophylaxis: enoxaparin (LOVENOX) injection 40 mg Start: 06/11/23 1000 Code Status:   Code Status: Full Code Family Communication: plan of care discussed with patient at bedside. Patient status is: Inpatient because of his right foot osteomyelitis Level of care: Progressive   Dispo: The patient is from: home            Anticipated disposition: TBD  Objective: Vitals last 24 hrs: Vitals:   06/14/23 2326 06/15/23 0240 06/15/23 0521 06/15/23 0901  BP: (!) 151/67  (!) 143/64 (!) 169/63  Pulse: 100  82 96  Resp: 17  18 20   Temp: 99.1 F (37.3 C)  99 F (37.2 C) 98 F (36.7 C)  TempSrc: Oral  Oral Oral  SpO2: 98%  97% 100%  Weight:  67.9 kg    Height:       Weight change:   Physical Examination: General exam: alert awake, oriented x3 HEENT:Oral mucosa moist, Ear/Nose WNL grossly Respiratory system: Bilaterally clear BS,no use of accessory muscle Cardiovascular system: S1 & S2 +, No JVD. Gastrointestinal system: Abdomen soft,NT,ND, BS+ Nervous System: Alert, awake, moving all extremities,and following commands. Extremities: Lt BKA old, right foot w/ wound on sole behind first toe Skin: No rashes,no icterus. MSK: Normal muscle bulk,tone, power   Medications reviewed:  Scheduled Meds:  amLODipine  5 mg Oral Daily   ascorbic acid  250 mg Oral Q M,W,F   aspirin EC  81 mg Oral Daily   atorvastatin  80 mg Oral QHS   enoxaparin (LOVENOX) injection  40 mg Subcutaneous Q24H   furosemide  20 mg Oral Daily   insulin aspart  0-6 Units Subcutaneous TID WC   insulin glargine-yfgn  7 Units Subcutaneous Daily   leptospermum manuka honey  1 Application Topical Daily   losartan  50 mg Oral Daily   metoCLOPramide (REGLAN) injection  5 mg Intravenous TID AC & HS   mycophenolate  1,000 mg Oral BID   pantoprazole (PROTONIX) IV  40 mg Intravenous Q12H   potassium chloride SA  20 mEq Oral Daily   tacrolimus  5 mg Oral BID   Vitamin D (Ergocalciferol)  50,000  Units Oral Q Wed   Continuous Infusions:  sodium chloride 100 mL/hr at 06/15/23 0547   ceFEPime (MAXIPIME) IV     vancomycin 1,000 mg (06/15/23 0321)      Diet Order             Diet NPO time specified  Diet effective midnight                   Intake/Output Summary (Last 24 hours) at 06/15/2023 0930 Last data filed at 06/15/2023 0534 Gross per 24 hour  Intake 2014.5 ml  Output 750 ml  Net 1264.5 ml   Net IO Since Admission: 751.74 mL [06/15/23 0930]  Wt Readings from Last 3 Encounters:  06/15/23 67.9 kg  02/27/21 71.9 kg  12/04/20 77.7 kg     Unresulted Labs (From admission, onward)     Start     Ordered   06/15/23 0500  Basic metabolic panel  Daily,   R     Question:  Specimen collection method  Answer:  Lab=Lab collect   06/14/23 1412   06/15/23 0500  CBC  Daily,   R     Question:  Specimen collection method  Answer:  Lab=Lab collect  06/14/23 1412   06/13/23 1209  Metanephrines, plasma  Once,   R       Question:  Specimen collection method  Answer:  Lab=Lab collect   06/13/23 1210   06/11/23 2209  Rapid urine drug screen (hospital performed)  ONCE - STAT,   STAT        06/11/23 2208   06/11/23 0828  Culture, blood (Routine X 2) w Reflex to ID Panel  BLOOD CULTURE X 2,   R (with TIMED occurrences)      06/11/23 0827          Data Reviewed: I have personally reviewed following labs and imaging studies CBC: Recent Labs  Lab 06/11/23 0935 06/12/23 0336 06/13/23 0456 06/14/23 0346 06/15/23 0404  WBC 15.9* 13.2* 11.4* 11.3* 12.9*  HGB 12.9* 12.0* 10.8* 10.1* 12.4*  HCT 39.5 36.3* 33.3* 29.9* 37.3*  MCV 81.3 80.5 78.4* 77.7* 79.7*  PLT 279 260 230 221 212   Basic Metabolic Panel: Recent Labs  Lab 06/10/23 0347 06/11/23 0935 06/12/23 0336 06/13/23 0456 06/14/23 0346 06/15/23 0404  NA 135 136 134* 132* 133* 134*  K 3.6 3.6 3.2* 4.0 4.5 4.0  CL 103 106 104 106 106 104  CO2 19* 16* 19* 18* 17* 19*  GLUCOSE 124* 228* 93 195* 210* 149*  BUN  17 25* 26* 21 20 16   CREATININE 0.93 1.06 1.19 1.03 1.06 1.16  CALCIUM 9.0 9.2 9.2 8.8* 8.6* 9.3  MG 2.2 2.0 2.1 1.8 1.6*  --    GFR: Estimated Creatinine Clearance: 56.9 mL/min (by C-G formula based on SCr of 1.16 mg/dL). Liver Function Tests: Recent Labs  Lab 06/13/23 1232  AST 15  ALT 15  ALKPHOS 80  BILITOT 1.1  PROT 7.0  ALBUMIN 3.4*   Recent Labs  Lab 06/13/23 1232  LIPASE 22  CBG: Recent Labs  Lab 06/13/23 2026 06/14/23 0605 06/14/23 1227 06/14/23 1621 06/15/23 0605  GLUCAP 264* 202* 218* 249* 171*   Lipid Profile: No results for input(s): "CHOL", "HDL", "LDLCALC", "TRIG", "CHOLHDL", "LDLDIRECT" in the last 72 hours.  Thyroid Function Tests: No results for input(s): "TSH", "T4TOTAL", "FREET4", "T3FREE", "THYROIDAB" in the last 72 hours. Sepsis Labs: Recent Labs  Lab 06/11/23 1258 06/11/23 1910 06/13/23 1320 06/13/23 1518  LATICACIDVEN 2.3* 1.5 1.8 1.1    Recent Results (from the past 240 hour(s))  SARS Coronavirus 2 by RT PCR (hospital order, performed in [facility] hospital lab) *cepheid single result test* Anterior Nasal Swab     Status: None   Collection Time: 06/06/23  3:47 PM   Specimen: Anterior Nasal Swab  Result Value Ref Range Status   SARS Coronavirus 2 by RT PCR NEGATIVE NEGATIVE Final    Comment: Performed at [facility], [address], [place], [place] [address]  Blood culture (routine x 2)     Status: None   Collection Time: 06/06/23  6:15 PM   Specimen: BLOOD LEFT ARM  Result Value Ref Range Status   Specimen Description BLOOD LEFT ARM  Final   Special Requests   Final    BOTTLES DRAWN AEROBIC AND ANAEROBIC Blood Culture adequate volume   Culture   Final    NO GROWTH 5 DAYS Performed at [facility], [address] [place], [place] [address]    Report Status 06/11/2023 FINAL  Final  Blood culture (routine x 2)     Status: None   Collection Time: 06/06/23  6:15 PM   Specimen: BLOOD  LEFT HAND  Result Value Ref  Range Status   Specimen Description BLOOD LEFT HAND  Final   Special Requests   Final    BOTTLES DRAWN AEROBIC AND ANAEROBIC Blood Culture adequate volume   Culture   Final    NO GROWTH 5 DAYS Performed at [facility], [address] [place], [place] [address]    Report Status 06/11/2023 FINAL  Final  Urine Culture     Status: None   Collection Time: 06/06/23  6:34 PM   Specimen: Urine, Clean Catch  Result Value Ref Range Status   Specimen Description URINE, CLEAN CATCH  Final   Special Requests NONE  Final   Culture   Final    NO GROWTH Performed at [facility], [address], [place], [place] [address]    Report Status 06/07/2023 FINAL  Final  Surgical PCR screen     Status: None   Collection Time: 06/09/23  3:59 AM   Specimen: Nasal Mucosa; Nasal Swab  Result Value Ref Range Status   MRSA, PCR NEGATIVE NEGATIVE Final   Staphylococcus aureus NEGATIVE NEGATIVE Final    Comment: (NOTE) The Xpert SA Assay (FDA approved for NASAL specimens in patients 49 years of age and older), is one component of a comprehensive surveillance program. It is not intended to diagnose infection nor to guide or monitor treatment. Performed at [facility], [address], [place], [place] [address]   Culture, blood (Routine X 2) w Reflex to ID Panel     Status: None (Preliminary result)   Collection Time: 06/11/23  9:35 AM   Specimen: BLOOD LEFT WRIST  Result Value Ref Range Status   Specimen Description BLOOD LEFT WRIST  Final   Special Requests   Final    BOTTLES DRAWN AEROBIC ONLY Blood Culture adequate volume   Culture   Final    NO GROWTH 4 DAYS Performed at [facility], [address], [place], [place] [address]    Report Status PENDING  Incomplete    Antimicrobials: Anti-infectives (From admission, onward)    Start     Dose/Rate Route Frequency Ordered Stop   06/15/23 2200  ceFEPIme (MAXIPIME) 2 g in sodium chloride 0.9 % 100 mL IVPB        2 g 200  mL/hr over 30 Minutes Intravenous Every 12 hours 06/15/23 0838     06/12/23 1530  vancomycin (VANCOCIN) IVPB 1000 mg/200 mL premix        1,000 mg 200 mL/hr over 60 Minutes Intravenous Every 12 hours 06/12/23 1439     06/09/23 0945  ceFAZolin (ANCEF) IVPB 2g/100 mL premix        2 g 200 mL/hr over 30 Minutes Intravenous On call to O.R. 06/09/23 0857 06/10/23 0559   06/08/23 1300  vancomycin (VANCOREADY) IVPB 1500 mg/300 mL  Status:  Discontinued        1,500 mg 150 mL/hr over 120 Minutes Intravenous Every 24 hours 06/08/23 0904 06/12/23 1439   06/08/23 1000  vancomycin (VANCOREADY) IVPB 1750 mg/350 mL  Status:  Discontinued        1,750 mg 175 mL/hr over 120 Minutes Intravenous  Once 06/07/23 1342 06/08/23 0904   06/08/23 0915  ceFEPIme (MAXIPIME) 2 g in sodium chloride 0.9 % 100 mL IVPB  Status:  Discontinued        2 g 200 mL/hr over 30 Minutes Intravenous Every 8 hours 06/08/23 0901 06/15/23 0838   06/07/23  1215  vancomycin (VANCOREADY) IVPB 1500 mg/300 mL        1,500 mg 150 mL/hr over 120 Minutes Intravenous  Once 06/07/23 1209 06/07/23 1501   06/07/23 1215  ceFEPIme (MAXIPIME) 2 g in sodium chloride 0.9 % 100 mL IVPB  Status:  Discontinued        2 g 200 mL/hr over 30 Minutes Intravenous 2 times daily 06/07/23 1209 06/08/23 0901   06/06/23 2300  piperacillin-tazobactam (ZOSYN) IVPB 3.375 g  Status:  Discontinued       Note to Pharmacy: Please dose   3.375 g 12.5 mL/hr over 240 Minutes Intravenous Every 8 hours 06/06/23 2244 06/07/23 1151   06/06/23 2100  ceFEPIme (MAXIPIME) 2 g in sodium chloride 0.9 % 100 mL IVPB        2 g 200 mL/hr over 30 Minutes Intravenous  Once 06/06/23 2055 06/06/23 2302      Culture/Microbiology    Component Value Date/Time   SDES BLOOD LEFT WRIST 06/11/2023 0935   SPECREQUEST  06/11/2023 0935    BOTTLES DRAWN AEROBIC ONLY Blood Culture adequate volume   CULT  06/11/2023 0935    NO GROWTH 4 DAYS Performed at [facility], [address] [place], [place] [address]    REPTSTATUS PENDING 06/11/2023 0935     Radiology Studies: VAS US CAROTID (at [facility] and [facility] only)  Result Date: 06/13/2023 Carotid Arterial Duplex Study Patient Name:  Jagjit Klahn.  Date of Exam:   06/13/2023 Medical Rec #: [id number]           Accession #:    [id number] Date of Birth: 13-Feb-1951           Patient Gender: M Patient Age:   72 years Exam Location:  [facility] Procedure:      VAS US CAROTID Referring Phys: Dewitt Hoes DE LA TORRE --------------------------------------------------------------------------------  Indications:      CVA. Risk Factors:     Hypertension, hyperlipidemia, Diabetes, past history of                   smoking. Other Factors:    PAD, renal failure s/p transplant,. Comparison Study: No previous exams Performing Technologist: Jody Hill RVT, RDMS  Examination Guidelines: A complete evaluation includes B-mode imaging, spectral Doppler, color Doppler, and power Doppler as needed of all accessible portions of each vessel. Bilateral testing is considered an integral part of a complete examination. Limited examinations for reoccurring indications may be performed as noted.  Right Carotid Findings: +----------+-------+--------+--------+-----------------------+-----------------+           PSV    EDV cm/sStenosisPlaque Description     Comments                    cm/s                                                            +----------+-------+--------+--------+-----------------------+-----------------+ CCA Prox  86     0               heterogenous           intimal  thickening        +----------+-------+--------+--------+-----------------------+-----------------+ CCA Distal78     0               heterogenous           intimal                                                                   thickening         +----------+-------+--------+--------+-----------------------+-----------------+ ICA Prox  59     12              calcific and                                                              heterogenous                             +----------+-------+--------+--------+-----------------------+-----------------+ ICA Distal42     8                                                        +----------+-------+--------+--------+-----------------------+-----------------+ ECA       188    0               heterogenous and                                                          calcific                                 +----------+-------+--------+--------+-----------------------+-----------------+ +----------+--------+-------+-------------+-------------------+           PSV cm/sEDV cmsDescribe     Arm Pressure (mmHG) +----------+--------+-------+-------------+-------------------+ [id number]     250    HD access RUE                    +----------+--------+-------+-------------+-------------------+ +---------+--------+--+--------+-+----------------------------+ VertebralPSV cm/s47EDV cm/s0Antegrade and High resistant +---------+--------+--+--------+-+----------------------------+  Left Carotid Findings: +----------+-------+--------+--------+-----------------------+-----------------+           PSV    EDV cm/sStenosisPlaque Description     Comments                    cm/s                                                            +----------+-------+--------+--------+-----------------------+-----------------+ CCA Prox  159    8  heterogenous           intimal                                                                   thickening        +----------+-------+--------+--------+-----------------------+-----------------+ CCA Distal137    23              calcific and           intimal                                             heterogenous           thickening        +----------+-------+--------+--------+-----------------------+-----------------+ ICA Prox  66     13              calcific and                                                              heterogenous                             +----------+-------+--------+--------+-----------------------+-----------------+ ICA Distal72     18                                                       +----------+-------+--------+--------+-----------------------+-----------------+ ECA       127    0               calcific and                                                              heterogenous                             +----------+-------+--------+--------+-----------------------+-----------------+ +----------+--------+--------+----------------+-------------------+           PSV cm/sEDV cm/sDescribe        Arm Pressure (mmHG) +----------+--------+--------+----------------+-------------------+ [id number]             Multiphasic, WNL                    +----------+--------+--------+----------------+-------------------+ +---------+--------+--+--------+--+---------+ VertebralPSV cm/s57EDV cm/s10Antegrade +---------+--------+--+--------+--+---------+   Summary: Right Carotid: Velocities in the right ICA are consistent with a 1-39% stenosis. Left Carotid: Velocities in the left ICA are consistent with a 1-39% stenosis. Vertebrals:  Left vertebral artery demonstrates antegrade flow. Right vertebral              artery demonstrates high resistant  flow. Subclavians: Right subclavian artery flow was disturbed, history of HD access in              RUE. Normal flow hemodynamics were seen in the left subclavian              artery. *See table(s) above for measurements and observations.  Electronically signed by Sherald Hess MD on 06/13/2023 at 4:19:49 PM.    Final      LOS: 9 days   Lanae Boast, MD [facility]  06/15/2023, 9:30  AM

## 2023-06-15 NOTE — Progress Notes (Signed)
        Plan for angiogram today with right LE runoff and possible intervention. Patient aware and agreed NPO  Mosetta Pigeon PA-C

## 2023-06-15 NOTE — Progress Notes (Signed)
  Progress Note    06/15/2023 10:02 AM 4 Days Post-Op  Subjective:  no complaints   Vitals:   06/15/23 0521 06/15/23 0901  BP: (!) 143/64 (!) 169/63  Pulse: 82 96  Resp: 18 20  Temp: 99 F (37.2 C) 98 F (36.7 C)  SpO2: 97% 100%   Physical Exam: Lungs:  non labored Extremities:  R foot plantar wound Neurologic: A&O  CBC    Component Value Date/Time   WBC 12.9 (H) 06/15/2023 0404   RBC 4.68 06/15/2023 0404   HGB 12.4 (L) 06/15/2023 0404   HCT 37.3 (L) 06/15/2023 0404   PLT 212 06/15/2023 0404   MCV 79.7 (L) 06/15/2023 0404   MCH 26.5 06/15/2023 0404   MCHC 33.2 06/15/2023 0404   RDW 14.4 06/15/2023 0404   LYMPHSABS 1.0 06/06/2023 1622   MONOABS 0.9 06/06/2023 1622   EOSABS 0.1 06/06/2023 1622   BASOSABS 0.1 06/06/2023 1622    BMET    Component Value Date/Time   NA 134 (L) 06/15/2023 0404   K 4.0 06/15/2023 0404   CL 104 06/15/2023 0404   CO2 19 (L) 06/15/2023 0404   GLUCOSE 149 (H) 06/15/2023 0404   BUN 16 06/15/2023 0404   CREATININE 1.16 06/15/2023 0404   CALCIUM 9.3 06/15/2023 0404   GFRNONAA >60 06/15/2023 0404   GFRAA >60 06/25/2020 1819    INR    Component Value Date/Time   INR 1.0 05/05/2020 1956     Intake/Output Summary (Last 24 hours) at 06/15/2023 1002 Last data filed at 06/15/2023 0534 Gross per 24 hour  Intake 2014.5 ml  Output 750 ml  Net 1264.5 ml     Assessment/Plan:  72 y.o. male with R foot wound 4 Days Post-Op   Plan is for Aortogram with R leg runoff with possible intervention by Dr. Myra Gianotti today due to R plantar foot wound.  Victorino Sparrow MD [facility] [phone] 06/15/2023 10:02 AM

## 2023-06-16 ENCOUNTER — Encounter (HOSPITAL_COMMUNITY): Payer: Self-pay | Admitting: Surgery

## 2023-06-16 DIAGNOSIS — G4733 Obstructive sleep apnea (adult) (pediatric): Secondary | ICD-10-CM | POA: Diagnosis not present

## 2023-06-16 DIAGNOSIS — I739 Peripheral vascular disease, unspecified: Secondary | ICD-10-CM | POA: Diagnosis not present

## 2023-06-16 DIAGNOSIS — Z940 Kidney transplant status: Secondary | ICD-10-CM | POA: Diagnosis not present

## 2023-06-16 DIAGNOSIS — M869 Osteomyelitis, unspecified: Secondary | ICD-10-CM | POA: Diagnosis not present

## 2023-06-16 LAB — GLUCOSE, CAPILLARY
Glucose-Capillary: 143 mg/dL — ABNORMAL HIGH (ref 70–99)
Glucose-Capillary: 115 mg/dL — ABNORMAL HIGH (ref 70–99)
Glucose-Capillary: 246 mg/dL — ABNORMAL HIGH (ref 70–99)
Glucose-Capillary: 258 mg/dL — ABNORMAL HIGH (ref 70–99)

## 2023-06-16 LAB — CULTURE, BLOOD (ROUTINE X 2)
Culture: NO GROWTH
Special Requests: ADEQUATE

## 2023-06-16 MED ORDER — VANCOMYCIN HCL 1250 MG/250ML IV SOLN
1250.0000 mg | INTRAVENOUS | Status: AC
  Administered 2023-06-17 – 2023-06-20 (×4): 1250 mg via INTRAVENOUS
  Filled 2023-06-16 (×5): qty 250

## 2023-06-16 NOTE — Progress Notes (Addendum)
  Progress Note    06/16/2023 8:20 AM 1 Day Post-Op  Subjective:  no complaints   Vitals:   06/16/23 0300 06/16/23 0757  BP: (!) 149/53 (!) 163/55  Pulse: 78 79  Resp:  16  Temp: 98.2 F (36.8 C) 98.4 F (36.9 C)  SpO2: 99% 99%   Physical Exam: Lungs:  non labored Extremities:  R foot plantar wound Neurologic: A&O  CBC    Component Value Date/Time   WBC 10.7 (H) 06/16/2023 0600   RBC 4.03 (L) 06/16/2023 0600   HGB 10.2 (L) 06/16/2023 0600   HCT 32.7 (L) 06/16/2023 0600   PLT 179 06/16/2023 0600   MCV 81.1 06/16/2023 0600   MCH 25.3 (L) 06/16/2023 0600   MCHC 31.2 06/16/2023 0600   RDW 14.6 06/16/2023 0600   LYMPHSABS 1.0 06/06/2023 1622   MONOABS 0.9 06/06/2023 1622   EOSABS 0.1 06/06/2023 1622   BASOSABS 0.1 06/06/2023 1622    BMET    Component Value Date/Time   NA 135 06/16/2023 0600   K 4.2 06/16/2023 0600   CL 108 06/16/2023 0600   CO2 19 (L) 06/16/2023 0600   GLUCOSE 142 (H) 06/16/2023 0600   BUN 17 06/16/2023 0600   CREATININE 1.28 (H) 06/16/2023 0600   CALCIUM 8.9 06/16/2023 0600   GFRNONAA >60 06/16/2023 0600   GFRAA >60 06/25/2020 1819    INR    Component Value Date/Time   INR 1.0 05/05/2020 1956     Intake/Output Summary (Last 24 hours) at 06/16/2023 0820 Last data filed at 06/16/2023 0352 Gross per 24 hour  Intake 1594.23 ml  Output 1500 ml  Net 94.23 ml     Assessment/Plan:  72 y.o. male with R foot wound 1 Day Post-Op   POD angiogram with Right leg AT plasty.  No inline flow to the DP.  He has been maximally revascularized.  While I do think a ray amp is reasonable to offer, I think the likelihood of it healing is low.  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 Other option is BKA. Will discuss with Dr. Lajoyce Corners.   Victorino Sparrow MD [facility] [phone] 06/16/2023 8:20 AM

## 2023-06-16 NOTE — Progress Notes (Signed)
Physical Therapy Treatment Patient Details Name: [name]. MRN: [id number] DOB: 03-15-1951 Today's Date: 06/16/2023   History of Present Illness Pt is 72 yo male who presents on 06/06/23 with generalized weakness and fatigue with tremors and chills. Has a wound under R great toe and plans for 1st ray amputation. 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 PMH: kidney transplant, DM, GERD, HTN, PTSD, PVD, L eye blindness, sleep apnea, thyroid disease, L BKA    PT Comments  Pt received in supine and agreeable to session. Pt reporting feeling better today and demonstrates good progress towards functional mobility goals. Pt able to stand from EOB and step pivot to recliner with up to min A and cues due to visual deficits. Pt able to march in place demonstrating low foot clearance, but is able to slightly improve with cues. Pt reporting some dizziness after given BP medicine, so gait was deferred this session. Pt continues to benefit from PT services to progress toward functional mobility goals.     If plan is discharge home, recommend the following: A little help with walking and/or transfers;A little help with bathing/dressing/bathroom;Help with stairs or ramp for entrance;Assistance with cooking/housework;Assist for transportation   Can travel by private vehicle        Equipment Recommendations  None recommended by PT    Recommendations for Other Services       Precautions / Restrictions Precautions Precautions: Fall;Other (comment) Precaution Comments: watch HR, vision impairment Required Braces or Orthoses: Other Brace Other Brace: L prosthesis Restrictions Weight Bearing Restrictions: No     Mobility  Bed Mobility Overal bed mobility: Needs Assistance Bed Mobility: Supine to Sit     Supine to sit: Supervision, HOB elevated     General bed mobility comments: increased time    Transfers Overall transfer level: Needs assistance Equipment used:  Rolling walker (2 wheels) Transfers: Sit to/from Stand, Bed to chair/wheelchair/BSC Sit to Stand: Min assist   Step pivot transfers: Contact guard assist       General transfer comment: Pt able to stand from EOB with light min A for power up. Pt able to step pivot to recliner with CGA for safety and cues for direction due to poor vision    Ambulation/Gait             Pre-gait activities: marching General Gait Details: deferred for safety due to lack of +2 for chair follow and slight dizziness   Modified Rankin (Stroke Patients Only) Modified Rankin (Stroke Patients Only) Pre-Morbid Rankin Score: Slight disability Modified Rankin: Moderately severe disability     Balance Overall balance assessment: Needs assistance Sitting-balance support: Feet supported, No upper extremity supported Sitting balance-Leahy Scale: Good Sitting balance - Comments: sitting EOB   Standing balance support: During functional activity, Bilateral upper extremity supported, Reliant on assistive device for balance Standing balance-Leahy Scale: Poor Standing balance comment: with RW support                            Cognition Arousal: Alert Behavior During Therapy: WFL for tasks assessed/performed Overall Cognitive Status: Within Functional Limits for tasks assessed                                          Exercises General Exercises - Lower Extremity Hip Flexion/Marching: AROM, Standing, Both,  10 reps    General Comments        Pertinent Vitals/Pain Pain Assessment Pain Assessment: No/denies pain     PT Goals (current goals can now be found in the care plan section) Acute Rehab PT Goals Patient Stated Goal: return home PT Goal Formulation: With patient Time For Goal Achievement: 06/26/23 Progress towards PT goals: Progressing toward goals    Frequency    Min 1X/week       AM-PAC PT "6 Clicks" Mobility   Outcome Measure  Help needed turning  from your back to your side while in a flat bed without using bedrails?: None Help needed moving from lying on your back to sitting on the side of a flat bed without using bedrails?: None Help needed moving to and from a bed to a chair (including a wheelchair)?: A Little Help needed standing up from a chair using your arms (e.g., wheelchair or bedside chair)?: A Little Help needed to walk in hospital room?: A Lot Help needed climbing 3-5 steps with a railing? : Total 6 Click Score: 17    End of Session Equipment Utilized During Treatment: Gait belt Activity Tolerance: Patient tolerated treatment well Patient left: in chair;with call bell/phone within reach Nurse Communication: Mobility status PT Visit Diagnosis: Dizziness and giddiness (R42)     Time: 4098-1191 PT Time Calculation (min) (ACUTE ONLY): 21 min  Charges:    $Therapeutic Activity: 8-22 mins PT General Charges $$ ACUTE PT VISIT: 1 Visit                    Johny Shock, PTA Acute Rehabilitation Services Secure Chat Preferred  [phone]    Johny Shock 06/16/2023, 12:56 PM

## 2023-06-16 NOTE — Progress Notes (Addendum)
Occupational Therapy Treatment Patient Details Name: [name]. MRN: [id number] DOB: 07/23/1951 Today's Date: 06/16/2023   History of present illness Pt is 72 yo male who presents on 06/06/23 with generalized weakness and fatigue with tremors and chills. Has a wound under R great toe and plans for 1st ray amputation. 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 PMH: kidney transplant, DM, GERD, HTN, PTSD, PVD, L eye blindness, sleep apnea, thyroid disease, L BKA   OT comments  Pt sitting in recliner and agreeable to participation in OT session upon arrival. OT instructed pt in techniques for increased safety and independence with ADLs, functional transfers, and bed mobility. Pt currently demonstrates ability to complete UB/LB dressing with Supervision-Set up, all steps of toileting task in simulated task with Contact guard assist, functional transfers with RW with Min assist to power up then Contact guard assist, and sit to supine transfer with Contact guard assist. Pt participated well in session and is making progress toward goals. Pt VSS on RA throughout session. Pt will benefit from continued acute skilled OT services to address deficits outlined below, decrease caregiver burden, and increase safety and independence with functional tasks. Post acute discharge, OT recommends home health. Addendum: There had been confusion earlier this day regarding pt preference for post acute rehab services. Pt agreeable to home health OT as recommended by OT.       If plan is discharge home, recommend the following:  A little help with walking and/or transfers;A little help with bathing/dressing/bathroom;Help with stairs or ramp for entrance;Assist for transportation;Direct supervision/assist for medications management;Assistance with cooking/housework (Assistance for medication management secondary to visual impairments)   Equipment Recommendations  None recommended by OT (Pt already has  needed equipment)    Recommendations for Other Services      Precautions / Restrictions Precautions Precautions: Fall;Other (comment) Precaution Comments: watch HR, vision impairment Required Braces or Orthoses: Other Brace Other Brace: L prosthesis Restrictions Weight Bearing Restrictions: No       Mobility Bed Mobility Overal bed mobility: Needs Assistance Bed Mobility: Sit to Supine       Sit to supine: Contact guard assist   General bed mobility comments: increased time    Transfers Overall transfer level: Needs assistance Equipment used: Rolling walker (2 wheels) Transfers: Sit to/from Stand, Bed to chair/wheelchair/BSC Sit to Stand: Min assist     Step pivot transfers: Contact guard assist     General transfer comment: Min assist to power up then Contact guard assist with occasional cues for safety due to visual impairment     Balance Overall balance assessment: Needs assistance Sitting-balance support: No upper extremity supported, Feet supported Sitting balance-Leahy Scale: Good Sitting balance - Comments: sitting EOB and sitting up in recliner   Standing balance support: Single extremity supported, Bilateral upper extremity supported, During functional activity, Reliant on assistive device for balance Standing balance-Leahy Scale: Poor Standing balance comment: with RW support                           ADL either performed or assessed with clinical judgement   ADL Overall ADL's : Needs assistance/impaired Eating/Feeding: Independent Eating/Feeding Details (indicate cue type and reason): requires orientation to tray due to vision impairment             Upper Body Dressing : Set up;Supervision/safety;Sitting   Lower Body Dressing: Set up;Supervision/safety;Sitting/lateral leans   Toilet Transfer: Contact guard assist;BSC/3in1;Rolling  walker (2 wheels) (step-pivot) Toilet Transfer Details (indicate cue type and reason): simulated  task Toileting- Clothing Manipulation and Hygiene: Contact guard assist;Minimal assistance;Cueing for compensatory techniques;Sit to/from stand Toileting - Clothing Manipulation Details (indicate cue type and reason): with increased time            Extremity/Trunk Assessment Upper Extremity Assessment Upper Extremity Assessment: Generalized weakness   Lower Extremity Assessment Lower Extremity Assessment: Defer to PT evaluation        Vision       Perception     Praxis      Cognition Arousal: Alert Behavior During Therapy: [facility] for tasks assessed/performed Overall Cognitive Status: Within Functional Limits for tasks assessed                                 General Comments: AAOx4 and pleasant throughout session.        Exercises      Shoulder Instructions       General Comments RN reporting episodes of hypertension earlier this day. Pt reporting mild dizziness upon sitting and standing but with VSS on RA throughout OT session.    Pertinent Vitals/ Pain       Pain Assessment Pain Assessment: No/denies pain  Home Living                                          Prior Functioning/Environment              Frequency  Min 1X/week        Progress Toward Goals  OT Goals(current goals can now be found in the care plan section)  Progress towards OT goals: Progressing toward goals  Acute Rehab OT Goals Patient Stated Goal: To return home with wife  Plan      Co-evaluation                 AM-PAC OT "6 Clicks" Daily Activity     Outcome Measure   Help from another person eating meals?: None Help from another person taking care of personal grooming?: A Little Help from another person toileting, which includes using toliet, bedpan, or urinal?: A Little Help from another person bathing (including washing, rinsing, drying)?: A Little Help from another person to put on and taking off regular upper body clothing?: A  Little Help from another person to put on and taking off regular lower body clothing?: A Little 6 Click Score: 19    End of Session Equipment Utilized During Treatment: Gait belt;Rolling walker (2 wheels)  OT Visit Diagnosis: Other abnormalities of gait and mobility (R26.89);Muscle weakness (generalized) (M62.81);Other (comment) (Decreased activity tolerance)   Activity Tolerance Patient tolerated treatment well   Patient Left in bed;with call bell/phone within reach;with bed alarm set   Nurse Communication Mobility status        Time: 1310-1343 OT Time Calculation (min): 33 min  Charges: OT General Charges $OT Visit: 1 Visit OT Treatments $Self Care/Home Management : 8-22 mins $Therapeutic Activity: 8-22 mins  Emalina Dubreuil "Orson Eva., OTR/L, MA Acute Rehab [phone]   Lendon Colonel 06/16/2023, 2:11 PM

## 2023-06-16 NOTE — Progress Notes (Signed)
Progress Note   Patient: [name]. WNU:[id number] DOB: 1950/09/30 DOA: 06/06/2023     10 DOS: the patient was seen and examined on 06/16/2023 at 11:55AM      Brief hospital course: Mr. Sheneman is a 72 y.o. M with hx renal/pancreas transplant, HTN, DM, L BKA, and chronic right foot wound who presented with chills.    MRI of the foot was consistent with osteomyelitis.  Ortho consulted.  Vascular surgery consulted, performed angiogram that showed poor runoff to the right foot, BKA recommended.    Hospitalization complicated by acute right blurry vision, found to be due to stroke.          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     Physical Exam: BP 125/63 (BP Location: Left Wrist)   Pulse 81   Temp 98.5 F (36.9 C) (Oral)   Resp 14   Ht 5\' 10"  (1.778 m)   Wt 67.7 kg   SpO2 99%   BMI 21.42 kg/m   Thin adult male, sitting on the edge of the bed, left BKA, trying to stand with a walker RRR, no murmurs, no peripheral edema Respiratory rate normal, lungs clear without rales or wheezes Abdomen soft no tenderness palpation Attention normal, affect appropriate, judgment and insight appear normal    Data Reviewed: Basic metabolic panel shows creatinine stable, potassium and sodium normal CBC shows leukocytosis resolved, hemoglobin down to 10.2  Family Communication:  None    Disposition: Status is: Inpatient Patient requires right BKA, placement, he is still deciding about surgery        Author: Alberteen Sam, MD 06/16/2023 7:02 PM  For on call review [email or web address].

## 2023-06-17 DIAGNOSIS — R6510 Systemic inflammatory response syndrome (SIRS) of non-infectious origin without acute organ dysfunction: Secondary | ICD-10-CM

## 2023-06-17 DIAGNOSIS — I639 Cerebral infarction, unspecified: Secondary | ICD-10-CM | POA: Diagnosis not present

## 2023-06-17 DIAGNOSIS — R6883 Chills (without fever): Secondary | ICD-10-CM

## 2023-06-17 DIAGNOSIS — L97511 Non-pressure chronic ulcer of other part of right foot limited to breakdown of skin: Secondary | ICD-10-CM

## 2023-06-17 DIAGNOSIS — M869 Osteomyelitis, unspecified: Secondary | ICD-10-CM | POA: Diagnosis not present

## 2023-06-17 LAB — CBC WITH DIFFERENTIAL/PLATELET
Abs Immature Granulocytes: 0.28 10*3/uL — ABNORMAL HIGH (ref 0.00–0.07)
Basophils Absolute: 0 10*3/uL (ref 0.0–0.1)
Basophils Relative: 0 %
Eosinophils Absolute: 0.1 10*3/uL (ref 0.0–0.5)
Eosinophils Relative: 1 %
HCT: 29.9 % — ABNORMAL LOW (ref 39.0–52.0)
Hemoglobin: 9.7 g/dL — ABNORMAL LOW (ref 13.0–17.0)
Immature Granulocytes: 2 %
Lymphocytes Relative: 14 %
Lymphs Abs: 1.6 10*3/uL (ref 0.7–4.0)
MCH: 25.6 pg — ABNORMAL LOW (ref 26.0–34.0)
MCHC: 32.4 g/dL (ref 30.0–36.0)
MCV: 78.9 fL — ABNORMAL LOW (ref 80.0–100.0)
Monocytes Absolute: 1 10*3/uL (ref 0.1–1.0)
Monocytes Relative: 8 %
Neutro Abs: 8.8 10*3/uL — ABNORMAL HIGH (ref 1.7–7.7)
Neutrophils Relative %: 75 %
Platelets: 182 10*3/uL (ref 150–400)
RBC: 3.79 MIL/uL — ABNORMAL LOW (ref 4.22–5.81)
RDW: 14.4 % (ref 11.5–15.5)
WBC: 11.9 10*3/uL — ABNORMAL HIGH (ref 4.0–10.5)
nRBC: 0 % (ref 0.0–0.2)

## 2023-06-17 LAB — COMPREHENSIVE METABOLIC PANEL
ALT: 41 U/L (ref 0–44)
AST: 32 U/L (ref 15–41)
Albumin: 3 g/dL — ABNORMAL LOW (ref 3.5–5.0)
Alkaline Phosphatase: 93 U/L (ref 38–126)
Anion gap: 16 — ABNORMAL HIGH (ref 5–15)
BUN: 14 mg/dL (ref 8–23)
CO2: 21 mmol/L — ABNORMAL LOW (ref 22–32)
Calcium: 9.1 mg/dL (ref 8.9–10.3)
Chloride: 100 mmol/L (ref 98–111)
Creatinine, Ser: 1.28 mg/dL — ABNORMAL HIGH (ref 0.61–1.24)
GFR, Estimated: >60 mL/min (ref >60)
Glucose, Bld: 255 mg/dL — ABNORMAL HIGH (ref 70–99)
Potassium: 4.1 mmol/L (ref 3.5–5.1)
Sodium: 137 mmol/L (ref 135–145)
Total Bilirubin: 0.6 mg/dL (ref 0.3–1.2)
Total Protein: 6 g/dL — ABNORMAL LOW (ref 6.5–8.1)

## 2023-06-17 LAB — GLUCOSE, CAPILLARY
Glucose-Capillary: 138 mg/dL — ABNORMAL HIGH (ref 70–99)
Glucose-Capillary: 164 mg/dL — ABNORMAL HIGH (ref 70–99)
Glucose-Capillary: 257 mg/dL — ABNORMAL HIGH (ref 70–99)

## 2023-06-17 LAB — PREALBUMIN: Prealbumin: 16 mg/dL — ABNORMAL LOW (ref 18–38)

## 2023-06-17 MED ORDER — POVIDONE-IODINE 10 % EX SWAB
2.0000 | Freq: Once | CUTANEOUS | Status: AC
  Administered 2023-06-18: 2 via TOPICAL

## 2023-06-17 MED ORDER — CHLORHEXIDINE GLUCONATE 4 % EX SOLN
60.0000 mL | Freq: Once | CUTANEOUS | Status: DC
  Filled 2023-06-17: qty 15

## 2023-06-17 MED ORDER — METOCLOPRAMIDE HCL 5 MG PO TABS
5.0000 mg | ORAL_TABLET | Freq: Once | ORAL | Status: AC
  Administered 2023-06-17: 5 mg via ORAL
  Filled 2023-06-17: qty 1

## 2023-06-17 MED ORDER — TRANEXAMIC ACID 1000 MG/10ML IV SOLN
2000.0000 mg | INTRAVENOUS | Status: AC
  Administered 2023-06-18: 1000 mg via TOPICAL
  Filled 2023-06-17: qty 20

## 2023-06-17 MED ORDER — TRANEXAMIC ACID-NACL 1000-0.7 MG/100ML-% IV SOLN
1000.0000 mg | INTRAVENOUS | Status: DC
  Filled 2023-06-17: qty 100

## 2023-06-17 MED ORDER — CEFAZOLIN SODIUM-DEXTROSE 2-4 GM/100ML-% IV SOLN
2.0000 g | INTRAVENOUS | Status: AC
  Administered 2023-06-18: 2 g via INTRAVENOUS
  Filled 2023-06-17: qty 100

## 2023-06-17 MED ORDER — ORAL CARE MOUTH RINSE: 15.0000 mL | OROMUCOSAL | Status: DC | PRN

## 2023-06-17 NOTE — H&P (View-Only) (Signed)
Patient ID: [name] Trimper., male   DOB: Apr 08, 1951, 72 y.o.   MRN: [id number] Patient is status post endovascular evaluation.  Patient does not have revascularization options for the right foot.  Patient has an ischemic ulcer beneath the first metatarsal head with the MRI scan showing osteomyelitis.  Have recommended patient proceed with a transtibial amputation of the right risks and benefits were discussed including risk of the wound not healing need for additional surgery.  Patient states he understands wishes to proceed at this time.  Plan for right below-knee amputation on Friday, tomorrow

## 2023-06-17 NOTE — Progress Notes (Signed)
Patient ID: [name]., male   DOB: [birth year], 72 y.o.   MRN: [id number] Patient is status post endovascular evaluation.  Patient does not have revascularization options for the right foot.  Patient has an ischemic ulcer beneath the first metatarsal head with the MRI scan showing osteomyelitis.  Have recommended patient proceed with a transtibial amputation of the right risks and benefits were discussed including risk of the wound not healing need for additional surgery.  Patient states he understands wishes to proceed at this time.  Plan for right below-knee amputation on Friday, tomorrow

## 2023-06-17 NOTE — Progress Notes (Addendum)
          Patient seen by Dr. Lajoyce Corners this am now with plans for BKA secondary to no inflow to the right LE.  The patient agrees to proceed with amputation.  VVS will be available if needed.   Mosetta Pigeon PA-C  VASCULAR STAFF ADDENDUM:  I agree with the above. Will sign off at this time with no follow up scheduled due to amputations.  Dr. Lajoyce Corners will call should wound healing issue concerns arise.    Victorino Sparrow MD [facility] Phone Number: [phone] 06/17/2023 3:17 PM

## 2023-06-17 NOTE — Progress Notes (Signed)
Progress Note   Patient: [name]. GNF:[id number] DOB: 11/05/1950 DOA: 06/06/2023     11 DOS: the patient was seen and examined on 06/17/2023   Brief hospital course: [name] is a 72 y.o. M with hx renal/pancreas transplant, HTN, DM, L BKA, and chronic right foot wound who presented with chills.    MRI of the foot was consistent with osteomyelitis.  Ortho consulted.  Vascular surgery consulted, performed angiogram that showed poor runoff to the right foot, BKA recommended.    Hospitalization complicated by acute right blurry vision, found to be due to stroke.      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 Planning for R transtibial amputation tomorrow   Diabetes - Continue semglee 7 units daily - Continue sliding scale corrections - glycemic trends stable   Pancreas and kidney transplant -Continue mycophenolate and tacrolimus   Acute stroke -Continue aspirin, Lipitor, Plavix   Hypertension Blood pressure controlled - Continue amlodipine, Lasix, losartan   Hyperlipidemia -Continue Lipitor   Subjective: Without complaints this AM.   Physical Exam: Vitals:   06/16/23 2256 06/17/23 0356 06/17/23 0833 06/17/23 1129  BP: (!) 146/55 (!) 143/64 (!) 154/57 (!) 157/67  Pulse: 85  86 82  Resp: 16 16 18 18   Temp: 98.4 F (36.9 C) 98.1 F (36.7 C) 97.7 F (36.5 C) 98.4 F (36.9 C)  TempSrc: Oral Oral Oral Oral  SpO2: 97% 97% 98% 98%  Weight:  70.2 kg    Height:       General exam: Awake, laying in bed, in nad Respiratory system: Normal respiratory effort, no wheezing Cardiovascular system: regular rate, s1, s2 Gastrointestinal system: Soft, nondistended, positive BS Central nervous system: CN2-12 grossly intact, strength intact Extremities: Perfused, no clubbing Skin: Normal skin  turgor, no notable skin lesions seen Psychiatry: Mood normal // no visual hallucinations   Data Reviewed:  Labs reviewed: Na 135, K 4.2, Cr 1.28, WBC 10.7, Hgb 10.2   Family Communication: Pt in room, family not at bedside  Disposition: Status is: Inpatient Remains inpatient appropriate because: severity of illness  Planned Discharge Destination: Home     Author: Rickey [name], MD 06/17/2023 3:08 PM  For on call review [email or web address].

## 2023-06-17 NOTE — Inpatient Diabetes Management (Signed)
Inpatient Diabetes Program Recommendations  AACE/ADA: New Consensus Statement on Inpatient Glycemic Control (2015)  Target Ranges:  Prepandial:   less than 140 mg/dL      Peak postprandial:   less than 180 mg/dL (1-2 hours)      Critically ill patients:  140 - 180 mg/dL     Latest Reference Range & Units 06/16/23 06:08 06/16/23 11:35 06/16/23 16:36 06/16/23 21:03 06/17/23 05:54  Glucose-Capillary 70 - 99 mg/dL 782 (H) 956 (H) 213 (H) 258 (H) 138 (H)    Home DM Meds: Lantus 28 units Daily     Metformin 500 mg bid   Current Orders: Novolog 0-6 units TID      Semglee 7 units Daily   Please consider adding Novolog hs scale   --Will follow patient during hospitalization--  Christena Deem RN, MSN, BC-ADM Inpatient Diabetes Coordinator Team Pager [phone] (8a-5p)

## 2023-06-17 NOTE — Progress Notes (Signed)
Physical Therapy Treatment Patient Details Name: [name]. MRN: [id number] DOB: 06/06/1951 Today's Date: 06/17/2023   History of Present Illness Pt is 72 yo male who presents on 06/06/23 with generalized weakness and fatigue with tremors and chills. Has a wound under R great toe and plans for 1st ray amputation. 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 PMH: kidney transplant, DM, GERD, HTN, PTSD, PVD, L eye blindness, sleep apnea, thyroid disease, L BKA    PT Comments  Pt received in supine and agreeable to session. Pt able to stand and pivot to recliner with CGA and cues due to visual impairment this session. Pt able to tolerate marching before requiring a seated rest break due to fatigue. Pt completing chair push ups for increased BUE strength and pt encouraged to perform independently to prepare for transfers after upcoming BKA. Pt continues to benefit from PT services to progress toward functional mobility goals.    If plan is discharge home, recommend the following: A little help with walking and/or transfers;A little help with bathing/dressing/bathroom;Help with stairs or ramp for entrance;Assistance with cooking/housework;Assist for transportation   Can travel by private vehicle        Equipment Recommendations  None recommended by PT    Recommendations for Other Services       Precautions / Restrictions Precautions Precautions: Fall;Other (comment) Precaution Comments: watch HR, vision impairment Required Braces or Orthoses: Other Brace Other Brace: L prosthesis Restrictions Weight Bearing Restrictions: No     Mobility  Bed Mobility Overal bed mobility: Needs Assistance Bed Mobility: Supine to Sit     Supine to sit: Supervision, HOB elevated     General bed mobility comments: increased time    Transfers Overall transfer level: Needs assistance Equipment used: Rolling walker (2 wheels) Transfers: Sit to/from Stand, Bed to  chair/wheelchair/BSC Sit to Stand: Contact guard assist   Step pivot transfers: Contact guard assist       General transfer comment: STS from EOB and step pivot to recliner with cues for direction due to vision impairment     Modified Rankin (Stroke Patients Only) Modified Rankin (Stroke Patients Only) Pre-Morbid Rankin Score: Slight disability Modified Rankin: Moderately severe disability     Balance Overall balance assessment: Needs assistance Sitting-balance support: No upper extremity supported, Feet supported Sitting balance-Leahy Scale: Good Sitting balance - Comments: sitting EOB and sitting up in recliner   Standing balance support: Bilateral upper extremity supported, During functional activity, Reliant on assistive device for balance Standing balance-Leahy Scale: Poor Standing balance comment: with RW support                            Cognition Arousal: Alert Behavior During Therapy: WFL for tasks assessed/performed Overall Cognitive Status: Within Functional Limits for tasks assessed                                          Exercises General Exercises - Upper Extremity Chair Push Up: AROM, 5 reps, Both, Seated General Exercises - Lower Extremity Hip Flexion/Marching: AROM, Standing, Both, 10 reps    General Comments        Pertinent Vitals/Pain Pain Assessment Pain Assessment: No/denies pain     PT Goals (current goals can now be found in the care plan section) Acute Rehab PT Goals Patient Stated  Goal: return home PT Goal Formulation: With patient Time For Goal Achievement: 06/26/23 Progress towards PT goals: Progressing toward goals    Frequency    Min 1X/week       AM-PAC PT "6 Clicks" Mobility   Outcome Measure  Help needed turning from your back to your side while in a flat bed without using bedrails?: None Help needed moving from lying on your back to sitting on the side of a flat bed without using  bedrails?: None Help needed moving to and from a bed to a chair (including a wheelchair)?: A Little Help needed standing up from a chair using your arms (e.g., wheelchair or bedside chair)?: A Little Help needed to walk in hospital room?: A Lot Help needed climbing 3-5 steps with a railing? : Total 6 Click Score: 17    End of Session   Activity Tolerance: Patient tolerated treatment well Patient left: in chair;with call bell/phone within reach Nurse Communication: Mobility status PT Visit Diagnosis: Dizziness and giddiness (R42)     Time: 7829-5621 PT Time Calculation (min) (ACUTE ONLY): 18 min  Charges:    $Therapeutic Activity: 8-22 mins PT General Charges $$ ACUTE PT VISIT: 1 Visit                    Johny Shock, PTA Acute Rehabilitation Services Secure Chat Preferred  [phone]    Johny Shock 06/17/2023, 1:54 PM

## 2023-06-17 NOTE — TOC Progression Note (Signed)
Transition of Care (TOC) - Progression Note    Patient Details  Name: [name]. MRN: [id number] Date of Birth: 05-Apr-1951  Transition of Care [facility]) CM/SW Contact  Kermit Balo, RN Phone Number: 06/17/2023, 1:10 PM  Clinical Narrative:    Pt to have amputation tomorrow.  TOC following for d/c needs.    Expected Discharge Plan: Home w Home Health Services Barriers to Discharge: Continued Medical Work up  Expected Discharge Plan and Services                                               Social Determinants of Health (SDOH) Interventions SDOH Screenings   Food Insecurity: No Food Insecurity (06/07/2023)  Housing: Medium Risk (06/07/2023)  Transportation Needs: Unmet Transportation Needs (06/07/2023)  Utilities: Not At Risk (06/07/2023)  Depression (PHQ2-9): Low Risk  (02/02/2020)  Social Connections: Unknown (02/06/2022)   Received from [facility], [facility]  Tobacco Use: Medium Risk (06/06/2023)    Readmission Risk Interventions     No data to display

## 2023-06-18 ENCOUNTER — Inpatient Hospital Stay (HOSPITAL_COMMUNITY)
Admission: EM | Disposition: A | Discharge status: Rehabilitation Facility | Payer: Self-pay | Source: Home / Self Care | Attending: Internal Medicine

## 2023-06-18 ENCOUNTER — Encounter (HOSPITAL_COMMUNITY): Payer: Self-pay | Admitting: Internal Medicine

## 2023-06-18 ENCOUNTER — Inpatient Hospital Stay (HOSPITAL_COMMUNITY): Payer: Medicare HMO | Admitting: Anesthesiology

## 2023-06-18 ENCOUNTER — Other Ambulatory Visit: Payer: Self-pay

## 2023-06-18 DIAGNOSIS — I639 Cerebral infarction, unspecified: Secondary | ICD-10-CM | POA: Diagnosis not present

## 2023-06-18 DIAGNOSIS — I10 Essential (primary) hypertension: Secondary | ICD-10-CM

## 2023-06-18 DIAGNOSIS — M869 Osteomyelitis, unspecified: Secondary | ICD-10-CM | POA: Diagnosis not present

## 2023-06-18 DIAGNOSIS — G4733 Obstructive sleep apnea (adult) (pediatric): Secondary | ICD-10-CM

## 2023-06-18 DIAGNOSIS — R6883 Chills (without fever): Secondary | ICD-10-CM | POA: Diagnosis not present

## 2023-06-18 DIAGNOSIS — I96 Gangrene, not elsewhere classified: Secondary | ICD-10-CM

## 2023-06-18 DIAGNOSIS — E1152 Type 2 diabetes mellitus with diabetic peripheral angiopathy with gangrene: Secondary | ICD-10-CM

## 2023-06-18 HISTORY — PX: AMPUTATION: SHX166

## 2023-06-18 LAB — VANCOMYCIN, TROUGH: Vancomycin Tr: 14 ug/mL — ABNORMAL LOW (ref 15–20)

## 2023-06-18 LAB — COMPREHENSIVE METABOLIC PANEL
ALT: 40 U/L (ref 0–44)
AST: 29 U/L (ref 15–41)
Albumin: 2.8 g/dL — ABNORMAL LOW (ref 3.5–5.0)
Alkaline Phosphatase: 90 U/L (ref 38–126)
Anion gap: 9 (ref 5–15)
BUN: 13 mg/dL (ref 8–23)
CO2: 20 mmol/L — ABNORMAL LOW (ref 22–32)
Calcium: 8.7 mg/dL — ABNORMAL LOW (ref 8.9–10.3)
Chloride: 105 mmol/L (ref 98–111)
Creatinine, Ser: 1.14 mg/dL (ref 0.61–1.24)
GFR, Estimated: >60 mL/min (ref >60)
Glucose, Bld: 202 mg/dL — ABNORMAL HIGH (ref 70–99)
Potassium: 3.9 mmol/L (ref 3.5–5.1)
Sodium: 134 mmol/L — ABNORMAL LOW (ref 135–145)
Total Bilirubin: 0.6 mg/dL (ref 0.3–1.2)
Total Protein: 5.8 g/dL — ABNORMAL LOW (ref 6.5–8.1)

## 2023-06-18 LAB — CBC
HCT: 29.1 % — ABNORMAL LOW (ref 39.0–52.0)
Hemoglobin: 9.6 g/dL — ABNORMAL LOW (ref 13.0–17.0)
MCH: 26.4 pg (ref 26.0–34.0)
MCHC: 33 g/dL (ref 30.0–36.0)
MCV: 79.9 fL — ABNORMAL LOW (ref 80.0–100.0)
Platelets: 147 10*3/uL — ABNORMAL LOW (ref 150–400)
RBC: 3.64 MIL/uL — ABNORMAL LOW (ref 4.22–5.81)
RDW: 14.4 % (ref 11.5–15.5)
WBC: 9.8 10*3/uL (ref 4.0–10.5)
nRBC: 0 % (ref 0.0–0.2)

## 2023-06-18 LAB — GLUCOSE, CAPILLARY
Glucose-Capillary: 201 mg/dL — ABNORMAL HIGH (ref 70–99)
Glucose-Capillary: 235 mg/dL — ABNORMAL HIGH (ref 70–99)
Glucose-Capillary: 189 mg/dL — ABNORMAL HIGH (ref 70–99)
Glucose-Capillary: 159 mg/dL — ABNORMAL HIGH (ref 70–99)
Glucose-Capillary: 144 mg/dL — ABNORMAL HIGH (ref 70–99)
Glucose-Capillary: 152 mg/dL — ABNORMAL HIGH (ref 70–99)
Glucose-Capillary: 220 mg/dL — ABNORMAL HIGH (ref 70–99)

## 2023-06-18 SURGERY — AMPUTATION BELOW KNEE
Anesthesia: General | Site: Knee | Laterality: Right

## 2023-06-18 MED ORDER — PHENOL 1.4 % MT LIQD: 1.0000 | OROMUCOSAL | Status: DC | PRN

## 2023-06-18 MED ORDER — OXYCODONE HCL 5 MG/5ML PO SOLN: 5.0000 mg | Freq: Once | ORAL | Status: DC | PRN

## 2023-06-18 MED ORDER — LABETALOL HCL 5 MG/ML IV SOLN
5.0000 mg | INTRAVENOUS | Status: AC | PRN
  Administered 2023-06-18: 5 mg via INTRAVENOUS

## 2023-06-18 MED ORDER — BUPIVACAINE HCL (PF) 0.5 % IJ SOLN
INTRAMUSCULAR | Status: DC | PRN
Start: 2023-06-18 — End: 2023-06-18
  Administered 2023-06-18: 20 mL via PERINEURAL

## 2023-06-18 MED ORDER — ALUM & MAG HYDROXIDE-SIMETH 200-200-20 MG/5ML PO SUSP: 15.0000 mL | ORAL | Status: DC | PRN

## 2023-06-18 MED ORDER — MAGNESIUM CITRATE PO SOLN
1.0000 | Freq: Once | ORAL | Status: DC | PRN
  Filled 2023-06-18: qty 296

## 2023-06-18 MED ORDER — ONDANSETRON HCL 4 MG/2ML IJ SOLN: 4.0000 mg | Freq: Once | INTRAMUSCULAR | Status: DC | PRN

## 2023-06-18 MED ORDER — BUPIVACAINE LIPOSOME 1.3 % IJ SUSP
INTRAMUSCULAR | Status: DC | PRN
  Administered 2023-06-18: 10 mL via PERINEURAL

## 2023-06-18 MED ORDER — OXYCODONE HCL 5 MG PO TABS
5.0000 mg | ORAL_TABLET | ORAL | Status: DC | PRN
  Administered 2023-06-19 – 2023-06-22 (×6): 10 mg via ORAL
  Filled 2023-06-18 (×7): qty 2

## 2023-06-18 MED ORDER — ONDANSETRON HCL 4 MG/2ML IJ SOLN
INTRAMUSCULAR | Status: DC | PRN
  Administered 2023-06-18: 4 mg via INTRAVENOUS

## 2023-06-18 MED ORDER — FENTANYL CITRATE (PF) 100 MCG/2ML IJ SOLN: 50.0000 ug | Freq: Once | INTRAMUSCULAR | Status: AC

## 2023-06-18 MED ORDER — MAGNESIUM SULFATE 2 GM/50ML IV SOLN: 2.0000 g | Freq: Every day | INTRAVENOUS | Status: DC | PRN

## 2023-06-18 MED ORDER — BISACODYL 5 MG PO TBEC
5.0000 mg | DELAYED_RELEASE_TABLET | Freq: Every day | ORAL | Status: DC | PRN
  Administered 2023-06-22: 5 mg via ORAL
  Filled 2023-06-18: qty 1

## 2023-06-18 MED ORDER — DEXAMETHASONE SODIUM PHOSPHATE 10 MG/ML IJ SOLN: INTRAMUSCULAR | Status: DC | PRN

## 2023-06-18 MED ORDER — GUAIFENESIN-DM 100-10 MG/5ML PO SYRP
15.0000 mL | ORAL_SOLUTION | ORAL | Status: DC | PRN
  Administered 2023-06-22: 15 mL via ORAL
  Filled 2023-06-18: qty 15

## 2023-06-18 MED ORDER — ONDANSETRON HCL 4 MG/2ML IJ SOLN
4.0000 mg | Freq: Four times a day (QID) | INTRAMUSCULAR | Status: DC | PRN
  Administered 2023-06-19 – 2023-06-23 (×3): 4 mg via INTRAVENOUS
  Filled 2023-06-18 (×4): qty 2

## 2023-06-18 MED ORDER — ACETAMINOPHEN 325 MG PO TABS: 325.0000 mg | ORAL_TABLET | ORAL | Status: DC | PRN

## 2023-06-18 MED ORDER — SODIUM CHLORIDE 0.9 % IV SOLN
2.0000 g | Freq: Three times a day (TID) | INTRAVENOUS | Status: DC
  Administered 2023-06-18 – 2023-06-19 (×3): 2 g via INTRAVENOUS
  Filled 2023-06-18 (×3): qty 12.5

## 2023-06-18 MED ORDER — ACETAMINOPHEN 325 MG PO TABS: 325.0000 mg | ORAL_TABLET | Freq: Four times a day (QID) | ORAL | Status: DC | PRN

## 2023-06-18 MED ORDER — METOPROLOL TARTRATE 5 MG/5ML IV SOLN: 2.0000 mg | INTRAVENOUS | Status: DC | PRN

## 2023-06-18 MED ORDER — ZINC SULFATE 220 (50 ZN) MG PO CAPS
220.0000 mg | ORAL_CAPSULE | Freq: Every day | ORAL | Status: DC
  Administered 2023-06-18 – 2023-06-29 (×12): 220 mg via ORAL
  Filled 2023-06-18 (×12): qty 1

## 2023-06-18 MED ORDER — PROPOFOL 10 MG/ML IV BOLUS
INTRAVENOUS | Status: AC
  Filled 2023-06-18: qty 20

## 2023-06-18 MED ORDER — OXYCODONE HCL 5 MG PO TABS
10.0000 mg | ORAL_TABLET | ORAL | Status: DC | PRN
  Administered 2023-06-18 – 2023-06-19 (×2): 15 mg via ORAL
  Filled 2023-06-18 (×2): qty 3

## 2023-06-18 MED ORDER — MEPERIDINE HCL 25 MG/ML IJ SOLN: 6.2500 mg | INTRAMUSCULAR | Status: DC | PRN

## 2023-06-18 MED ORDER — INSULIN GLARGINE-YFGN 100 UNIT/ML ~~LOC~~ SOLN
10.0000 [IU] | Freq: Every day | SUBCUTANEOUS | Status: DC
  Administered 2023-06-19 – 2023-06-21 (×4): 10 [IU] via SUBCUTANEOUS
  Filled 2023-06-18 (×4): qty 0.1

## 2023-06-18 MED ORDER — CHLORHEXIDINE GLUCONATE 0.12 % MT SOLN
OROMUCOSAL | Status: AC
  Administered 2023-06-18: 15 mL via OROMUCOSAL
  Filled 2023-06-18: qty 15

## 2023-06-18 MED ORDER — POTASSIUM CHLORIDE CRYS ER 20 MEQ PO TBCR: 20.0000 meq | EXTENDED_RELEASE_TABLET | Freq: Every day | ORAL | Status: DC | PRN

## 2023-06-18 MED ORDER — FENTANYL CITRATE (PF) 100 MCG/2ML IJ SOLN: 25.0000 ug | INTRAMUSCULAR | Status: DC | PRN

## 2023-06-18 MED ORDER — FENTANYL CITRATE (PF) 100 MCG/2ML IJ SOLN
INTRAMUSCULAR | Status: AC
  Administered 2023-06-18: 50 ug via INTRAVENOUS
  Filled 2023-06-18: qty 2

## 2023-06-18 MED ORDER — PROPOFOL 10 MG/ML IV BOLUS
INTRAVENOUS | Status: DC | PRN
  Administered 2023-06-18: 150 mg via INTRAVENOUS

## 2023-06-18 MED ORDER — LABETALOL HCL 5 MG/ML IV SOLN
INTRAVENOUS | Status: AC
  Administered 2023-06-18: 5 mg via INTRAVENOUS
  Filled 2023-06-18: qty 4

## 2023-06-18 MED ORDER — SODIUM CHLORIDE 0.9 % IV SOLN: INTRAVENOUS | Status: DC

## 2023-06-18 MED ORDER — JUVEN PO PACK
1.0000 | PACK | Freq: Two times a day (BID) | ORAL | Status: DC
  Administered 2023-06-19 – 2023-06-28 (×11): 1 via ORAL
  Filled 2023-06-18 (×13): qty 1

## 2023-06-18 MED ORDER — HYDRALAZINE HCL 20 MG/ML IJ SOLN: 5.0000 mg | INTRAMUSCULAR | Status: DC | PRN

## 2023-06-18 MED ORDER — ORAL CARE MOUTH RINSE: 15.0000 mL | Freq: Once | OROMUCOSAL | Status: AC

## 2023-06-18 MED ORDER — PANTOPRAZOLE SODIUM 40 MG PO TBEC: 40.0000 mg | DELAYED_RELEASE_TABLET | Freq: Every day | ORAL | Status: DC

## 2023-06-18 MED ORDER — ROPIVACAINE HCL 5 MG/ML IJ SOLN
INTRAMUSCULAR | Status: DC | PRN
Start: 2023-06-18 — End: 2023-06-18
  Administered 2023-06-18: 20 mL via PERINEURAL

## 2023-06-18 MED ORDER — LACTATED RINGERS IV SOLN: INTRAVENOUS | Status: DC

## 2023-06-18 MED ORDER — MIDAZOLAM HCL 2 MG/2ML IJ SOLN
INTRAMUSCULAR | Status: AC
  Filled 2023-06-18: qty 2

## 2023-06-18 MED ORDER — ACETAMINOPHEN 160 MG/5ML PO SOLN: 325.0000 mg | ORAL | Status: DC | PRN

## 2023-06-18 MED ORDER — CHLORHEXIDINE GLUCONATE 0.12 % MT SOLN: 15.0000 mL | Freq: Once | OROMUCOSAL | Status: AC

## 2023-06-18 MED ORDER — OXYCODONE HCL 5 MG PO TABS: 5.0000 mg | ORAL_TABLET | Freq: Once | ORAL | Status: DC | PRN

## 2023-06-18 MED ORDER — CEFAZOLIN SODIUM-DEXTROSE 2-4 GM/100ML-% IV SOLN: 2.0000 g | Freq: Three times a day (TID) | INTRAVENOUS | Status: DC

## 2023-06-18 MED ORDER — POLYETHYLENE GLYCOL 3350 17 G PO PACK: 17.0000 g | PACK | Freq: Every day | ORAL | Status: DC | PRN

## 2023-06-18 MED ORDER — VITAMIN C 500 MG PO TABS
1000.0000 mg | ORAL_TABLET | Freq: Every day | ORAL | Status: DC
  Administered 2023-06-18 – 2023-06-29 (×12): 1000 mg via ORAL
  Filled 2023-06-18 (×12): qty 2

## 2023-06-18 MED ORDER — LABETALOL HCL 5 MG/ML IV SOLN: 10.0000 mg | INTRAVENOUS | Status: DC | PRN

## 2023-06-18 MED ORDER — DOCUSATE SODIUM 100 MG PO CAPS
100.0000 mg | ORAL_CAPSULE | Freq: Every day | ORAL | Status: DC
  Administered 2023-06-19 – 2023-06-21 (×3): 100 mg via ORAL
  Filled 2023-06-18 (×3): qty 1

## 2023-06-18 MED ORDER — HYDROMORPHONE HCL 1 MG/ML IJ SOLN
0.5000 mg | INTRAMUSCULAR | Status: DC | PRN
  Administered 2023-06-19 (×3): 1 mg via INTRAVENOUS
  Administered 2023-06-20: 0.5 mg via INTRAVENOUS
  Administered 2023-06-20 – 2023-06-22 (×3): 1 mg via INTRAVENOUS
  Filled 2023-06-18 (×4): qty 1
  Filled 2023-06-18: qty 0.5
  Filled 2023-06-18 (×3): qty 1

## 2023-06-18 SURGICAL SUPPLY — 40 items
BAG COUNTER SPONGE SURGICOUNT (BAG) IMPLANT
BAG SPNG CNTER NS LX DISP (BAG)
BLADE SAW RECIP 87.9 MT (BLADE) ×2 IMPLANT
BLADE SURG 21 STRL SS (BLADE) ×2 IMPLANT
BNDG CMPR 5X6 CHSV STRCH STRL (GAUZE/BANDAGES/DRESSINGS)
BNDG COHESIVE 6X5 TAN ST LF (GAUZE/BANDAGES/DRESSINGS) IMPLANT
CANISTER WOUND CARE 500ML ATS (WOUND CARE) ×2 IMPLANT
COVER SURGICAL LIGHT HANDLE (MISCELLANEOUS) ×2 IMPLANT
CUFF TOURN SGL QUICK 34 (TOURNIQUET CUFF) ×1
CUFF TRNQT CYL 34X4.125X (TOURNIQUET CUFF) ×2 IMPLANT
DRAPE DERMATAC (DRAPES) IMPLANT
DRAPE INCISE IOBAN 66X45 STRL (DRAPES) ×2 IMPLANT
DRAPE U-SHAPE 47X51 STRL (DRAPES) ×2 IMPLANT
DRESSING PREVENA PLUS CUSTOM (GAUZE/BANDAGES/DRESSINGS) ×2 IMPLANT
DRSG PREVENA PLUS CUSTOM (GAUZE/BANDAGES/DRESSINGS) ×1 IMPLANT
DURAPREP 26ML APPLICATOR (WOUND CARE) ×2 IMPLANT
ELECT REM PT RETURN 9FT ADLT (ELECTROSURGICAL) ×1 IMPLANT
ELECTRODE REM PT RTRN 9FT ADLT (ELECTROSURGICAL) ×2 IMPLANT
GLOVE BIOGEL PI IND STRL 9 (GLOVE) ×2 IMPLANT
GLOVE SURG ORTHO 9.0 STRL STRW (GLOVE) ×2 IMPLANT
GOWN STRL REUS W/ TWL XL LVL3 (GOWN DISPOSABLE) ×4 IMPLANT
GOWN STRL REUS W/TWL XL LVL3 (GOWN DISPOSABLE) ×2
GRAFT SKIN WND MICRO 38 (Tissue) IMPLANT
KIT BASIN OR (CUSTOM PROCEDURE TRAY) ×2 IMPLANT
KIT TURNOVER KIT B (KITS) ×2 IMPLANT
MANIFOLD NEPTUNE II (INSTRUMENTS) ×2 IMPLANT
NS IRRIG 1000ML POUR BTL (IV SOLUTION) ×2 IMPLANT
PACK ORTHO EXTREMITY (CUSTOM PROCEDURE TRAY) ×2 IMPLANT
PAD ARMBOARD 7.5X6 YLW CONV (MISCELLANEOUS) ×2 IMPLANT
PREVENA RESTOR ARTHOFORM 46X30 (CANNISTER) ×2 IMPLANT
SPONGE T-LAP 18X18 ~~LOC~~+RFID (SPONGE) IMPLANT
STAPLER VISISTAT 35W (STAPLE) IMPLANT
STOCKINETTE IMPERVIOUS LG (DRAPES) ×2 IMPLANT
SUT ETHILON 2 0 PSLX (SUTURE) IMPLANT
SUT SILK 2 0 (SUTURE) ×1
SUT SILK 2-0 18XBRD TIE 12 (SUTURE) ×2 IMPLANT
SUT VIC AB 1 CTX 27 (SUTURE) ×4 IMPLANT
TOWEL GREEN STERILE (TOWEL DISPOSABLE) ×2 IMPLANT
TUBE CONNECTING 12X1/4 (SUCTIONS) ×2 IMPLANT
YANKAUER SUCT BULB TIP NO VENT (SUCTIONS) ×2 IMPLANT

## 2023-06-18 NOTE — Interval H&P Note (Signed)
History and Physical Interval Note:  06/18/2023 6:35 AM  [name].  has presented today for surgery, with the diagnosis of Gangrene Right Foot.  The various methods of treatment have been discussed with the patient and family. After consideration of risks, benefits and other options for treatment, the patient has consented to  Procedure(s): RIGHT BELOW KNEE AMPUTATION (Right) as a surgical intervention.  The patient's history has been reviewed, patient examined, no change in status, stable for surgery.  I have reviewed the patient's chart and labs.  Questions were answered to the patient's satisfaction.     [name]

## 2023-06-18 NOTE — Progress Notes (Signed)
Pharmacy Antibiotic Note- follow up  [name]. is a 72 y.o. male admitted on 06/06/2023 with right foot ulcer with osteomyelitis.  Pharmacy has been consulted for cefepime and vancomycin dosing.  Cr up slightly. Planning for vascular angiography today. Will repeat VT later this week.   Plan: Continue vancomycin 1250mg  IV q12h (eAUC 446 mcg*hr/mL) Adjust cefepime 2g IV q8h (CrCl >60) RBKA 9/20 12:15 w/ Dr. Lajoyce Corners. Follow-up post-op on antibiotic plan (continue vs discontinue) Follow-up with levels if continued past 24 hours    Height: 5\' 10"  (177.8 cm) Weight: 71.1 kg (156 lb 12 oz) IBW/kg (Calculated) : 73  Temp (24hrs), Avg:98.7 F (37.1 C), Min:98.4 F (36.9 C), Max:99 F (37.2 C)  Recent Labs  Lab 06/11/23 0935 06/11/23 1258 06/11/23 1910 06/12/23 0336 06/12/23 1336 06/13/23 0456 06/13/23 1320 06/13/23 1518 06/14/23 0346 06/15/23 0404 06/16/23 0600 06/17/23 2209 06/18/23 0438  WBC 15.9*  --   --    < >  --    < >  --   --  11.3* 12.9* 10.7* 11.9* 9.8  CREATININE 1.06  --   --    < >  --    < >  --   --  1.06 1.16 1.28* 1.28* 1.14  LATICACIDVEN 1.2 2.3* 1.5  --   --   --  1.8 1.1  --   --   --   --   --   VANCOTROUGH  --   --   --   --  12*  --   --   --   --   --   --   --   --    < > = values in this interval not displayed.    Estimated Creatinine Clearance: 60.6 mL/min (by C-G formula based on SCr of 1.14 mg/dL).    No Known Allergies   Microbiology results: 9/13 Bcx ngtdF 9/8 BCx: negF 9/8 UCx: negF   Greta Doom BS, PharmD, BCPS Clinical Pharmacist 06/18/2023 9:12 AM  Contact: [phone] after 3 PM  "Be curious, not judgmental..." -Debbora Dus

## 2023-06-18 NOTE — Anesthesia Preprocedure Evaluation (Addendum)
Anesthesia Evaluation    Reviewed: Allergy & Precautions, Patient's Chart, lab work & pertinent test results, reviewed documented beta blocker date and time   History of Anesthesia Complications Negative for: history of anesthetic complications  Airway Mallampati: II  TM Distance: >3 FB Neck ROM: Full    Dental no notable dental hx. (+) Missing,    Pulmonary sleep apnea and Continuous Positive Airway Pressure Ventilation , former smoker   Pulmonary exam normal breath sounds clear to auscultation       Cardiovascular hypertension, Pt. on medications + Peripheral Vascular Disease  Normal cardiovascular exam Rhythm:Regular Rate:Normal  TTE 2018: mild LVH, EF 60-65%, valves ok   Neuro/Psych  PSYCHIATRIC DISORDERS Anxiety      Neuromuscular disease negative neurological ROS     GI/Hepatic Neg liver ROS,GERD  Controlled and Medicated,,dysphagia, abnormal esophagram   Endo/Other  diabetes, Type 2, Insulin Dependent, Oral Hypoglycemic Agents    Renal/GU ARF and Renal InsufficiencyRenal disease  negative genitourinary   Musculoskeletal negative musculoskeletal ROS (+)    Abdominal   Peds  Hematology negative hematology ROS (+) Blood dyscrasia, anemia   Anesthesia Other Findings   Reproductive/Obstetrics negative OB ROS                             Anesthesia Physical Anesthesia Plan  ASA: 3  Anesthesia Plan: General   Post-op Pain Management: Regional block*   Induction: Intravenous  PONV Risk Score and Plan: 1 and Treatment may vary due to age or medical condition, Ondansetron and Dexamethasone  Airway Management Planned: LMA  Additional Equipment: None  Intra-op Plan:   Post-operative Plan: Extubation in OR  Informed Consent: I have reviewed the patients History and Physical, chart, labs and discussed the procedure including the risks, benefits and alternatives for the proposed  anesthesia with the patient or authorized representative who has indicated his/her understanding and acceptance.     Dental advisory given  Plan Discussed with: CRNA and Surgeon  Anesthesia Plan Comments:         Anesthesia Quick Evaluation

## 2023-06-18 NOTE — Progress Notes (Signed)
Progress Note   Patient: [name]. ZOX:[id number] DOB: 1950-12-21 DOA: 06/06/2023     12 DOS: the patient was seen and examined on 06/18/2023   Brief hospital course: Mr. Middendorf is a 72 y.o. M with hx renal/pancreas transplant, HTN, DM, L BKA, and chronic right foot wound who presented with chills.    MRI of the foot was consistent with osteomyelitis.  Ortho consulted.  Vascular surgery consulted, performed angiogram that showed poor runoff to the right foot, BKA recommended.    Hospitalization complicated by acute right blurry vision, found to be due to stroke.      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  -Now s/p R BKA 9/20 - Will f/u with PT recs   Diabetes - Continue semglee 10 units daily - Continue sliding scale corrections - glycemic trends stable   Pancreas and kidney transplant -Continue mycophenolate and tacrolimus   Acute stroke -Continue aspirin, Lipitor, Plavix   Hypertension Blood pressure controlled - Continue amlodipine, Lasix, losartan   Hyperlipidemia -Continue Lipitor   Subjective: Without complaints this AM.   Physical Exam: Vitals:   06/18/23 1630 06/18/23 1645 06/18/23 1700 06/18/23 1715  BP: (!) 197/85 (!) 178/76 (!) 176/75 (!) 172/71  Pulse: 96 96 88 81  Resp: 14 (!) 23 (!) 22 19  Temp:    99.2 F (37.3 C)  TempSrc:      SpO2: 96% 97% 96% 96%  Weight:      Height:       General exam: Conversant, in no acute distress Respiratory system: normal chest rise, clear, no audible wheezing Cardiovascular system: regular rhythm, s1-s2 Gastrointestinal system: Nondistended, nontender, pos BS Central nervous system: No seizures, no tremors Extremities: No cyanosis, no joint deformities Skin: No rashes, no pallor Psychiatry: Affect normal // no auditory  hallucinations    Data Reviewed:  Labs reviewed: Na 134, K 3.9, Cr 1.14, WBC 9.8, Hgb 9.6   Family Communication: Pt in room, family not at bedside  Disposition: Status is: Inpatient Remains inpatient appropriate because: severity of illness  Planned Discharge Destination: Home     Author: Rickey [name], MD 06/18/2023 5:41 PM  For on call review [email or web address].

## 2023-06-18 NOTE — Progress Notes (Signed)
Pt came back to rm 6 from PACU. Reinitiated tele. VSS. Call bell within reach.    Lawson Radar, RN

## 2023-06-18 NOTE — Progress Notes (Signed)
OT Cancellation Note  Patient Details Name: [name]. MRN: [id number] DOB: 1951-07-23   Cancelled Treatment:    Reason Eval/Treat Not Completed: Patient at procedure or test/ unavailable (Pt scheduled for R BKA today. Per RN, pt scheduled to transport to surgery within the next 15 minutes. Per RN, OT to hold today and plan to re-eval pt tomorrow as appropriate/available.)  Rosanne Sack "Orson Eva., OTR/L, MA Acute Rehab [phone]  Lendon Colonel 06/18/2023, 11:38 AM

## 2023-06-18 NOTE — Progress Notes (Signed)
Administered hydralazine iv 10mg  for bp 162/70.  Lawson Radar, RN

## 2023-06-18 NOTE — Anesthesia Procedure Notes (Signed)
Anesthesia Regional Block: Adductor canal block   Pre-Anesthetic Checklist: , timeout performed,  Correct Patient, Correct Site, Correct Laterality,  Correct Procedure, Correct Position, site marked,  Risks and benefits discussed,  Surgical consent,  Pre-op evaluation,  At surgeon's request and post-op pain management  Laterality: Right  Prep: chloraprep       Needles:  Injection technique: Single-shot  Needle Type: Echogenic Needle     Needle Length: 9cm      Additional Needles:   Procedures:,,,, ultrasound used (permanent image in chart),,    Narrative:  Start time: 06/18/2023 1:20 PM End time: 06/18/2023 1:25 PM Injection made incrementally with aspirations every 5 mL.  Performed by: Personally  Anesthesiologist: Eilene Ghazi, MD  Additional Notes: Patient tolerated the procedure well without complications

## 2023-06-18 NOTE — Anesthesia Procedure Notes (Signed)
Procedure Name: LMA Insertion Date/Time: 06/18/2023 3:03 PM  Performed by: Sandie Ano, CRNAPre-anesthesia Checklist: Patient identified, Emergency Drugs available, Suction available and Patient being monitored Patient Re-evaluated:Patient Re-evaluated prior to induction Oxygen Delivery Method: Circle System Utilized Preoxygenation: Pre-oxygenation with 100% oxygen Induction Type: IV induction Ventilation: Mask ventilation without difficulty LMA: LMA inserted LMA Size: 4.0 Number of attempts: 1 Airway Equipment and Method: Bite block Placement Confirmation: positive ETCO2 Tube secured with: Tape Dental Injury: Teeth and Oropharynx as per pre-operative assessment

## 2023-06-18 NOTE — Anesthesia Procedure Notes (Signed)
Anesthesia Regional Block: Popliteal block   Pre-Anesthetic Checklist: , timeout performed,  Correct Patient, Correct Site, Correct Laterality,  Correct Procedure, Correct Position, site marked,  Risks and benefits discussed,  Surgical consent,  Pre-op evaluation,  At surgeon's request and post-op pain management  Laterality: Right  Prep: chloraprep       Needles:  Injection technique: Single-shot  Needle Type: Echogenic Needle     Needle Length: 9cm      Additional Needles:   Procedures:,,,, ultrasound used (permanent image in chart),,    Narrative:  Start time: 06/18/2023 1:09 PM End time: 06/18/2023 1:18 PM Injection made incrementally with aspirations every 5 mL.  Performed by: Personally  Anesthesiologist: Eilene Ghazi, MD  Additional Notes: Patient tolerated the procedure well without complications

## 2023-06-18 NOTE — Transfer of Care (Signed)
Immediate Anesthesia Transfer of Care Note  Patient: [name].  Procedure(s) Performed: RIGHT BELOW KNEE AMPUTATION (Right: Knee)  Patient Location: PACU  Anesthesia Type:General  Level of Consciousness: drowsy and patient cooperative  Airway & Oxygen Therapy: Patient Spontanous Breathing  Post-op Assessment: Report given to RN and Post -op Vital signs reviewed and stable  Post vital signs: Reviewed and stable  Last Vitals:  Vitals Value Taken Time  BP 175/70 06/18/23 1548  Temp    Pulse 84 06/18/23 1550  Resp 21 06/18/23 1550  SpO2 98 % 06/18/23 1550  Vitals shown include unfiled device data.  Last Pain:  Vitals:   06/18/23 1348  TempSrc:   PainSc: 0-No pain         Complications: No notable events documented.

## 2023-06-18 NOTE — Op Note (Signed)
06/18/2023  3:41 PM  PATIENT:  [name].    PRE-OPERATIVE DIAGNOSIS:  Gangrene Right Foot  POST-OPERATIVE DIAGNOSIS:  Same  PROCEDURE:  RIGHT BELOW KNEE AMPUTATION Application of Kerecis micro graft 38 cm  Application of Prevena customizable and Prevena arthroform wound VAC dressings Application of Vive Wear stump shrinker and the Hanger limb protector  SURGEON:  Nadara Mustard, MD  ANESTHESIA:   General  PREOPERATIVE INDICATIONS:  [name]. is a  72 y.o. male with a diagnosis of Gangrene Right Foot who failed conservative measures and elected for surgical management.    The risks benefits and alternatives were discussed with the patient preoperatively including but not limited to the risks of infection, bleeding, nerve injury, cardiopulmonary complications, the need for revision surgery, among others, and the patient was willing to proceed.  OPERATIVE IMPLANTS:   Implant Name Type Inv. Item Serial No. Manufacturer Lot No. LRB No. Used Action  GRAFT SKIN WND MICRO 38 - [id number] Tissue GRAFT SKIN WND MICRO 38  [employer] [id number] Right 1 Implanted     OPERATIVE FINDINGS: calcified arteries, hematoma in soft tissue anterior compartment, possibly for endovascular intervention  OPERATIVE PROCEDURE: Patient was brought to the operating room after undergoing a regional anesthetic.  After adequate levels anesthesia were obtained a thigh tourniquet was placed and the lower extremity was prepped using DuraPrep draped into a sterile field. The foot was draped out of the sterile field with impervious stockinette.  A timeout was called and the tourniquet inflated.  A transverse skin incision was made 12 cm distal to the tibial tubercle, the incision curved proximally, and a large posterior flap was created.  The tibia was transected just proximal to the skin incision and beveled anteriorly.  The fibula was transected just proximal to the tibial incision.  The sciatic nerve  was pulled cut and allowed to retract.  The vascular bundles were suture ligated with 2-0 silk.  The tourniquet was deflated and hemostasis obtained.      The Kerecis micro powder 38 cm was applied to the open wound that has a 200 cm surface area.   The deep and superficial fascial layers were closed using #1 Vicryl.  The skin was closed using staples.    The Prevena customizable dressing was applied this was overwrapped with the arthroform sponge.  Collier Flowers was used to secure the sponges and the circumferential compression was secured to the skin with Dermatac.  This was connected to the wound VAC pump and had a good suction fit this was covered with a stump shrinker and a limb protector.  Patient was taken to the PACU in stable condition.   DISCHARGE PLANNING:  Antibiotic duration: 24-hour antibiotics  Weightbearing: Nonweightbearing on the operative extremity  Pain medication: Opioid pathway  Dressing care/ Wound VAC: Continue wound VAC with the Prevena plus pump at discharge for 1 week  Ambulatory devices: Walker or kneeling scooter  Discharge to: Discharge planning based on recommendations per physical therapy  Follow-up: In the office 1 week after discharge.

## 2023-06-19 ENCOUNTER — Encounter (HOSPITAL_COMMUNITY): Payer: Self-pay | Admitting: Orthopedic Surgery

## 2023-06-19 DIAGNOSIS — R6883 Chills (without fever): Secondary | ICD-10-CM | POA: Diagnosis not present

## 2023-06-19 DIAGNOSIS — I639 Cerebral infarction, unspecified: Secondary | ICD-10-CM | POA: Diagnosis not present

## 2023-06-19 DIAGNOSIS — M869 Osteomyelitis, unspecified: Secondary | ICD-10-CM | POA: Diagnosis not present

## 2023-06-19 LAB — GLUCOSE, CAPILLARY
Glucose-Capillary: 205 mg/dL — ABNORMAL HIGH (ref 70–99)
Glucose-Capillary: 213 mg/dL — ABNORMAL HIGH (ref 70–99)
Glucose-Capillary: 175 mg/dL — ABNORMAL HIGH (ref 70–99)
Glucose-Capillary: 156 mg/dL — ABNORMAL HIGH (ref 70–99)
Glucose-Capillary: 118 mg/dL — ABNORMAL HIGH (ref 70–99)

## 2023-06-19 LAB — CBC
HCT: 26.8 % — ABNORMAL LOW (ref 39.0–52.0)
Hemoglobin: 8.6 g/dL — ABNORMAL LOW (ref 13.0–17.0)
MCH: 25.3 pg — ABNORMAL LOW (ref 26.0–34.0)
MCHC: 32.1 g/dL (ref 30.0–36.0)
MCV: 78.8 fL — ABNORMAL LOW (ref 80.0–100.0)
Platelets: 187 10*3/uL (ref 150–400)
RBC: 3.4 MIL/uL — ABNORMAL LOW (ref 4.22–5.81)
RDW: 14.6 % (ref 11.5–15.5)
WBC: 12.2 10*3/uL — ABNORMAL HIGH (ref 4.0–10.5)
nRBC: 0 % (ref 0.0–0.2)

## 2023-06-19 LAB — COMPREHENSIVE METABOLIC PANEL
ALT: 30 U/L (ref 0–44)
AST: 18 U/L (ref 15–41)
Albumin: 2.6 g/dL — ABNORMAL LOW (ref 3.5–5.0)
Alkaline Phosphatase: 75 U/L (ref 38–126)
Anion gap: 7 (ref 5–15)
BUN: 16 mg/dL (ref 8–23)
CO2: 19 mmol/L — ABNORMAL LOW (ref 22–32)
Calcium: 8.4 mg/dL — ABNORMAL LOW (ref 8.9–10.3)
Chloride: 105 mmol/L (ref 98–111)
Creatinine, Ser: 1.46 mg/dL — ABNORMAL HIGH (ref 0.61–1.24)
GFR, Estimated: 51 mL/min — ABNORMAL LOW (ref >60)
Glucose, Bld: 283 mg/dL — ABNORMAL HIGH (ref 70–99)
Potassium: 4.1 mmol/L (ref 3.5–5.1)
Sodium: 131 mmol/L — ABNORMAL LOW (ref 135–145)
Total Bilirubin: 0.8 mg/dL (ref 0.3–1.2)
Total Protein: 5.4 g/dL — ABNORMAL LOW (ref 6.5–8.1)

## 2023-06-19 MED ORDER — SODIUM CHLORIDE 0.9 % IV SOLN
2.0000 g | Freq: Two times a day (BID) | INTRAVENOUS | Status: AC
  Administered 2023-06-19 – 2023-06-20 (×3): 2 g via INTRAVENOUS
  Filled 2023-06-19 (×3): qty 12.5

## 2023-06-19 NOTE — Progress Notes (Signed)
Progress Note   Patient: [name]. FIE:[id number] DOB: 1950-12-21 DOA: 06/06/2023     13 DOS: the patient was seen and examined on 06/19/2023   Brief hospital course: Mr. Phommachanh is a 72 y.o. M with hx renal/pancreas transplant, HTN, DM, L BKA, and chronic right foot wound who presented with chills.    MRI of the foot was consistent with osteomyelitis.  Ortho consulted.  Vascular surgery consulted, performed angiogram that showed poor runoff to the right foot, BKA recommended.    Hospitalization complicated by acute right blurry vision, found to be due to stroke.      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  -Now s/p R BKA 9/20 - Therapy recs for acute inpatient rehab   Diabetes - Continue semglee 10 units daily - Continue sliding scale corrections - glycemic trends stable   Pancreas and kidney transplant -Continue mycophenolate and tacrolimus   Acute stroke -Continue aspirin, Lipitor, Plavix   Hypertension Blood pressure controlled - Continue amlodipine, Lasix, losartan   Hyperlipidemia -Continue Lipitor   Subjective: Eager to work with therapy. Hoping to be able to discharge soon  Physical Exam: Vitals:   06/18/23 2311 06/19/23 0310 06/19/23 0746 06/19/23 1231  BP: (!) 112/49 (!) 117/52 (!) 122/46 138/67  Pulse: 78  79 96  Resp: 18 20 16 17   Temp: 98.2 F (36.8 C)  98.1 F (36.7 C) 97.6 F (36.4 C)  TempSrc: Oral Oral Oral Oral  SpO2: 100%   98%  Weight:      Height:       General exam: Awake, laying in bed, in nad Respiratory system: Normal respiratory effort, no wheezing Cardiovascular system: regular rate, s1, s2 Gastrointestinal system: Soft, nondistended, positive BS Central nervous system: CN2-12 grossly intact, strength intact Extremities: Perfused, no  clubbing Skin: Normal skin turgor, no notable skin lesions seen Psychiatry: Mood normal // no visual hallucinations   Data Reviewed:  Labs reviewed: Na 131, K 4.1, Cr 1.46, WBC 12.2, Hgb 8.6, Plts 187   Family Communication: Pt in room, family not at bedside  Disposition: Status is: Inpatient Remains inpatient appropriate because: severity of illness  Planned Discharge Destination: Home     Author: Rickey [name], MD 06/19/2023 2:01 PM  For on call review [email or web address].

## 2023-06-19 NOTE — Evaluation (Signed)
Occupational Therapy Re-Evaluation Patient Details Name: [name]. MRN: [id number] DOB: 1951/05/06 Today's Date: 06/19/2023   History of Present Illness Pt is 72 yo male who presents on 06/06/23 with generalized weakness and fatigue with tremors and chills. Has a wound under R great toe and plans for 1st ray amputation. 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 PMH: kidney transplant, DM, GERD, HTN, PTSD, PVD, L eye blindness, sleep apnea, thyroid disease, L BKA   Clinical Impression   OT reevaluated pt with goals and discharge recommendations updated this day as pt is now s/p R BKA. Pt now presents with generalized weakness, decreases sitting balance during functional tasks, R LE pain, and decreased safety and independence with ADLs and functional transfers. Pt currently demonstrates ability to complete UB ADLs Independent to Contact guard assist, LB ADLs with Mod assist, and lateral-scoot transfers with Min assist +2 for safety. Pt will benefit from continued acute skilled OT services to address deficits outlined below and increase safety and independence with functional tasks. Post acute discharge, pt will benefit from intensive inpatient skilled rehab services > 3 hours per day to maximize rehab potential.       If plan is discharge home, recommend the following: Two people to help with walking and/or transfers;A lot of help with bathing/dressing/bathroom;Assistance with cooking/housework;Assist for transportation;Help with stairs or ramp for entrance    Functional Status Assessment  Patient has had a recent decline in their functional status and demonstrates the ability to make significant improvements in function in a reasonable and predictable amount of time.  Equipment Recommendations  Other (comment) (Defer to next level of care)    Recommendations for Other  Services Rehab consult     Precautions / Restrictions Precautions Precautions: Fall;Other (comment) Precaution Comments: watch HR, vision impairment Required Braces or Orthoses: Other Brace Other Brace: L prosthesis (unable to don this date, release button locked up) Restrictions Weight Bearing Restrictions: No      Mobility Bed Mobility Overal bed mobility: Needs Assistance Bed Mobility: Supine to Sit, Sit to Supine     Supine to sit: Min assist, +2 for safety/equipment Sit to supine: Mod assist, +2 for physical assistance   General bed mobility comments: min assist +2 supine to sit for trunk elevation, steadying, scoot to EOB. Mod +2 for return to supine for LE lifting, trunk lower, and boost up in bed with bed pad.    Transfers Overall transfer level: Needs assistance   Transfers: Bed to chair/wheelchair/BSC            Lateral/Scoot Transfers: Min assist, +2 safety/equipment General transfer comment: assist for hip translation, steadying      Balance Overall balance assessment: Needs assistance Sitting-balance support: No upper extremity supported, Feet supported Sitting balance-Leahy Scale: Fair         Standing balance comment: unable to attempt as pt could not get prosthetic on and s/p R BKA                           ADL either performed or assessed with clinical judgement   ADL Overall ADL's : Needs assistance/impaired Eating/Feeding: Independent;Sitting   Grooming: Set up;Sitting   Upper Body Bathing: Contact guard assist;Sitting   Lower Body Bathing: Moderate assistance;Sitting/lateral leans   Upper Body Dressing : Contact guard assist;Sitting   Lower Body Dressing:  Moderate assistance;Sitting/lateral leans   Toilet Transfer: Minimal assistance;+2 for safety/equipment;BSC/3in1;Requires drop arm (lateral-scoot)   Toileting- Clothing Manipulation and Hygiene: Moderate assistance;Sitting/lateral lean               Vision  Baseline Vision/History: 2 Legally blind;1 Wears glasses (hx of L eye blindness) Ability to See in Adequate Light: 1 Impaired Patient Visual Report: Blurring of vision (R eye)       Perception         Praxis         Pertinent Vitals/Pain Pain Assessment Pain Assessment: Faces Faces Pain Scale: Hurts a little bit Pain Location: RLE Pain Descriptors / Indicators: Grimacing, Guarding, Discomfort Pain Intervention(s): Limited activity within patient's tolerance, Monitored during session, Repositioned     Extremity/Trunk Assessment Upper Extremity Assessment Upper Extremity Assessment: Right hand dominant;Generalized weakness;RUE deficits/detail;LUE deficits/detail RUE Deficits / Details: decreased AROM shoulder flexion to approx. 100 degrees at baseline; generalized weakness LUE Deficits / Details: mildly impaired AROM shoulder flexion at baseline but WFL; generalized weaknses   Lower Extremity Assessment Lower Extremity Assessment: Defer to PT evaluation RLE Deficits / Details: post-op BKA; able to perform quad set with 0 deg extension achieved, SLR without quad lag LLE Deficits / Details: WFL given BKA   Cervical / Trunk Assessment Cervical / Trunk Assessment: Normal   Communication Communication Communication: No apparent difficulties Cueing Techniques: Gestural cues;Verbal cues   Cognition Arousal: Alert Behavior During Therapy: WFL for tasks assessed/performed Overall Cognitive Status: No family/caregiver present to determine baseline cognitive functioning                                 General Comments: AAOx4 and able to follow 1-step commands consistantly. Emergent awareness and Sustained attention. Pt can be tangential in conversation and requires occasional cues to maintain attention to task. Decreased safety awareness and decreased insight into deficits.     General Comments       Exercises     Shoulder Instructions      Home Living  Family/patient expects to be discharged to:: Private residence Living Arrangements: Spouse/significant other;Other relatives Available Help at Discharge: Family;Available PRN/intermittently Type of Home: House Home Access: Stairs to enter [employer] of Steps: 1   Home Layout: One level     Bathroom Shower/Tub: Chief Strategy Officer: Standard     Home Equipment: Cane - single point;Shower seat;Grab bars - toilet;Grab bars - tub/shower;BSC/3in1;Standard Environmental consultant      Lives With: Spouse    Prior Functioning/Environment Prior Level of Function : Independent/Modified Independent             Mobility Comments: At baseline, pt wears prosthesis every day, ambulates with SPC. Since admitted, pt has been light assist for transfers ADLs Comments: At baseline pt is Independent with ADLs.        OT Problem List: Decreased strength;Decreased activity tolerance;Impaired balance (sitting and/or standing);Decreased knowledge of use of DME or AE;Decreased knowledge of precautions;Decreased safety awareness      OT Treatment/Interventions: Self-care/ADL training;Therapeutic exercise;DME and/or AE instruction;Therapeutic activities;Patient/family education;Balance training;Cognitive remediation/compensation    OT Goals(Current goals can be found in the care plan section) Acute Rehab OT Goals Patient Stated Goal: To return home OT Goal Formulation: With patient Time For Goal Achievement: 07/03/23 Potential to Achieve Goals: Good ADL Goals Pt Will Transfer to Toilet: with contact guard assist;squat pivot transfer;bedside commode Pt Will Perform Toileting - Clothing Manipulation and hygiene:  with contact guard assist;sitting/lateral leans  OT Frequency: Min 1X/week    Co-evaluation PT/OT/SLP Co-Evaluation/Treatment: Yes Reason for Co-Treatment: For patient/therapist safety;To address functional/ADL transfers PT goals addressed during session: Mobility/safety with  mobility;Balance;Proper use of DME OT goals addressed during session: ADL's and self-care;Proper use of Adaptive equipment and DME      AM-PAC OT "6 Clicks" Daily Activity     Outcome Measure Help from another person eating meals?: None Help from another person taking care of personal grooming?: A Little Help from another person toileting, which includes using toliet, bedpan, or urinal?: A Lot Help from another person bathing (including washing, rinsing, drying)?: A Lot Help from another person to put on and taking off regular upper body clothing?: A Little Help from another person to put on and taking off regular lower body clothing?: A Lot 6 Click Score: 16   End of Session Equipment Utilized During Treatment: Other (comment) (R limb protector) Nurse Communication: Mobility status  Activity Tolerance: Patient tolerated treatment well;Patient limited by fatigue Patient left: in bed;with call bell/phone within reach;with bed alarm set  OT Visit Diagnosis: Other abnormalities of gait and mobility (R26.89);Muscle weakness (generalized) (M62.81)                Time: 1610-9604 OT Time Calculation (min): 45 min Charges:  OT General Charges $OT Visit: 1 Visit OT Evaluation $OT Eval Low Complexity: 1 Low OT Treatments $Self Care/Home Management : 8-22 mins  Kayla Weekes "Orson Eva., OTR/L, MA Acute Rehab [phone]   Lendon Colonel 06/19/2023, 1:41 PM

## 2023-06-19 NOTE — Evaluation (Signed)
Physical Therapy Re-Evaluation Patient Details Name: [name]. MRN: [id number] DOB: [birth year] Today's Date: 06/19/2023  History of Present Illness  Pt is 72 yo male who presents on 06/06/23 with generalized weakness and fatigue with tremors and chills. Has a wound under R great toe and plans for 1st ray amputation. 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 PMH: kidney transplant, DM, GERD, HTN, PTSD, PVD, L eye blindness, sleep apnea, thyroid disease, L BKA  Clinical Impression   Pt now s/p R BKA. Pt presents with generalized weakness, impaired balance, RLE pain, impaired mobility. Pt to benefit from acute PT to address deficits. Pt requiring min-mod +2 assist for lateral-scoot level mobility at this time, requires very increased time and fatigues quickly. PT anticipates pt will need post-acute rehab given new BKA. Patient will benefit from intensive inpatient follow up therapy, >3 hours/day. PT to progress mobility as tolerated, and will continue to follow acutely.          If plan is discharge home, recommend the following: Help with stairs or ramp for entrance;Assistance with cooking/housework;Assist for transportation;A lot of help with walking and/or transfers;A lot of help with bathing/dressing/bathroom   Can travel by private vehicle        Equipment Recommendations Wheelchair (measurements PT);Wheelchair cushion (measurements PT)  Recommendations for Other Services       Functional Status Assessment Patient has had a recent decline in their functional status and demonstrates the ability to make significant improvements in function in a reasonable and predictable amount of time.     Precautions / Restrictions Precautions Precautions: Fall;Other (comment) Precaution Comments: watch HR, vision impairment Required Braces or Orthoses: Other Brace Other  Brace: L prosthesis (unable to don this date, release button locked up) Restrictions Weight Bearing Restrictions: No      Mobility  Bed Mobility Overal bed mobility: Needs Assistance Bed Mobility: Supine to Sit, Sit to Supine     Supine to sit: Min assist, +2 for safety/equipment Sit to supine: Mod assist, +2 for physical assistance   General bed mobility comments: min assist +2 supine to sit for trunk elevation, steadying, scoot to EOB. Mod +2 for return to supine for LE lifting, trunk lower, and boost up in bed with bed pad.    Transfers Overall transfer level: Needs assistance   Transfers: Bed to chair/wheelchair/BSC            Lateral/Scoot Transfers: Min assist, +2 safety/equipment General transfer comment: assist for hip translation, steadying    Ambulation/Gait                  Stairs            Wheelchair Mobility     Tilt Bed    Modified Rankin (Stroke Patients Only)       Balance Overall balance assessment: Needs assistance Sitting-balance support: No upper extremity supported, Feet supported Sitting balance-Leahy Scale: Fair         Standing balance comment: unable to attempt as pt could not get prosthetic on and s/p R BKA                             Pertinent Vitals/Pain Pain Assessment Pain Assessment: Faces Faces Pain Scale: Hurts a little bit Pain Location: RLE Pain Descriptors / Indicators: Grimacing, Guarding, Discomfort Pain Intervention(s):  Limited activity within patient's tolerance, Monitored during session, Repositioned    Home Living Family/patient expects to be discharged to:: Private residence Living Arrangements: Spouse/significant other;Other relatives Available Help at Discharge: Family;Available PRN/intermittently Type of Home: House Home Access: Stairs to enter   Entrance Stairs-Number of Steps: 1   Home Layout: One level Home Equipment: Cane - single point;Shower seat;Grab bars -  toilet;Grab bars - tub/shower;BSC/3in1;Standard Walker      Prior Function Prior Level of Function : Independent/Modified Independent             Mobility Comments: wears prosthesis everyday, ambulates with SPC. Since admitted, pt has been light assist for transfers ADLs Comments: Ind     Extremity/Trunk Assessment   Upper Extremity Assessment Upper Extremity Assessment: Defer to OT evaluation    Lower Extremity Assessment RLE Deficits / Details: post-op BKA; able to perform quad set with 0 deg extension achieved, SLR without quad lag LLE Deficits / Details: WFL given BKA    Cervical / Trunk Assessment Cervical / Trunk Assessment: Normal  Communication   Communication Communication: No apparent difficulties Cueing Techniques: Gestural cues;Verbal cues  Cognition Arousal: Alert Behavior During Therapy: WFL for tasks assessed/performed Overall Cognitive Status: No family/caregiver present to determine baseline cognitive functioning                                 General Comments: tangential in conversation, can be difficult to keep on task        General Comments      Exercises     Assessment/Plan    PT Assessment Patient needs continued PT services  PT Problem List Decreased balance;Decreased mobility;Cardiopulmonary status limiting activity;Decreased strength;Decreased activity tolerance;Decreased knowledge of use of DME;Pain;Decreased skin integrity;Decreased safety awareness       PT Treatment Interventions DME instruction;Gait training;Stair training;Functional mobility training;Therapeutic activities;Therapeutic exercise;Balance training;Patient/family education    PT Goals (Current goals can be found in the Care Plan section)  Acute Rehab PT Goals Patient Stated Goal: return home PT Goal Formulation: With patient Time For Goal Achievement: 07/03/23 Potential to Achieve Goals: Good    Frequency Min 1X/week     Co-evaluation    Reason for Co-Treatment: For patient/therapist safety;To address functional/ADL transfers PT goals addressed during session: Mobility/safety with mobility;Balance;Proper use of DME         AM-PAC PT "6 Clicks" Mobility  Outcome Measure Help needed turning from your back to your side while in a flat bed without using bedrails?: A Little Help needed moving from lying on your back to sitting on the side of a flat bed without using bedrails?: A Little Help needed moving to and from a bed to a chair (including a wheelchair)?: A Lot Help needed standing up from a chair using your arms (e.g., wheelchair or bedside chair)?: A Lot Help needed to walk in hospital room?: Total Help needed climbing 3-5 steps with a railing? : Total 6 Click Score: 12    End of Session Equipment Utilized During Treatment: Other (comment) (R limb protector) Activity Tolerance: Patient tolerated treatment well;Patient limited by fatigue Patient left: with call bell/phone within reach;in bed;with bed alarm set Nurse Communication: Mobility status PT Visit Diagnosis: Dizziness and giddiness (R42)    Time: 0981-1914 PT Time Calculation (min) (ACUTE ONLY): 46 min   Charges:   PT Evaluation $PT Eval Low Complexity: 1 Low PT Treatments $Neuromuscular Re-education: 8-22 mins PT General Charges $$ ACUTE PT  VISIT: 1 Visit         Marye Round, PT DPT Acute Rehabilitation Services Secure Chat Preferred  Office [phone]   Truddie Coco 06/19/2023, 1:05 PM

## 2023-06-19 NOTE — Progress Notes (Addendum)
Pharmacy Antibiotic Note- follow up  [name]. is a 72 y.o. male admitted on 06/06/2023 with right foot ulcer with osteomyelitis.  Pharmacy has been consulted for cefepime and vancomycin dosing.  Scr uptrending at 1.46. WBC 12.2. Vanc trough of 14 on 9/20.   Plan: Continue vancomycin 1250mg  IV q12h (eAUC 545) Adjust cefepime 2g IV q12h (CrCl 47.3 mL/min) RBKA 9/20 12:15 w/ Dr. Lajoyce Corners. Follow-up antibiotic plan  Repeat trough if vancomycin continued   Height: 5\' 10"  (177.8 cm) Weight: 71.1 kg (156 lb 12 oz) IBW/kg (Calculated) : 73  Temp (24hrs), Avg:98.2 F (36.8 C), Min:97.6 F (36.4 C), Max:99.2 F (37.3 C)  Recent Labs  Lab 06/13/23 1320 06/13/23 1518 06/14/23 0346 06/15/23 0404 06/16/23 0600 06/17/23 2209 06/18/23 0438 06/18/23 0954 06/19/23 0334  WBC  --   --    < > 12.9* 10.7* 11.9* 9.8  --  12.2*  CREATININE  --   --    < > 1.16 1.28* 1.28* 1.14  --  1.46*  LATICACIDVEN 1.8 1.1  --   --   --   --   --   --   --   VANCOTROUGH  --   --   --   --   --   --   --  14*  --    < > = values in this interval not displayed.    Estimated Creatinine Clearance: 47.3 mL/min (A) (by C-G formula based on SCr of 1.46 mg/dL (H)).    No Known Allergies   Microbiology results: 9/13 Bcx ngtdF 9/8 BCx: negF 9/8 UCx: Kirtland Bouchard, PharmD PGY1 Pharmacy Resident 06/19/2023 2:02 PM

## 2023-06-19 NOTE — Progress Notes (Signed)
Inpatient Rehab Admissions Coordinator:  Consult received. Await therapy evaluations and recommendations to help determine appropriate rehab venue.  Gayland Curry, [place], [place] Admissions Coordinator [phone]

## 2023-06-19 NOTE — Plan of Care (Signed)
Problem: Coping: Goal: Ability to adjust to condition or change in health will improve Outcome: Progressing

## 2023-06-19 NOTE — PMR Pre-admission (Signed)
PMR Admission Coordinator Pre-Admission Assessment  Patient: [name] Vest. is an 72 y.o., male MRN: [id number] DOB: [birth year] Height: 5\' 10"  (177.8 cm) Weight: 71.1 kg              Insurance Information HMO: yes    PPO:      PCP:      IPA:      80/20:      OTHER:  PRIMARY: [employer]      Policy#: [id number], Medicare: [id number] Subscriber: patient CM Name: [name]       Phone#: [phone]     Fax#: [phone]   Received faxed approval on 10/1 for auth 88/4-16/6 Pre-Cert#: [id number]      Employer:  Benefits:  Phone #:      Name:  [name] Date: 12/28/2022- still active   Deductible: does not have one   OOP Max: $3,600 ($320 met)   CIR: $295/day co-pay with a max co-pay of $2,065/admission (7 days)   SNF: $20/day co-pay for days 1-20, $203/day co-pay for days 21-100, limited to 100 days/cal yr   Outpatient:  $25 copay/visit Home Health:  100% coverage   DME: 80% coverage; 20% co-insurance   Providers: In network  SECONDARY:       Policy#:       Phone#:   Artist:       Phone#:   The Data processing manager" for patients in Inpatient Rehabilitation Facilities with attached "Privacy Act Statement-Health Care Records" was provided and verbally reviewed with: Patient  Emergency Contact Information Contact Information     Name Relation Home Work Mobile   [name],[name] Spouse [phone]        Other Contacts   None on File    Current Medical History  Patient Admitting Diagnosis: CVA; s/p R BKA History of Present Illness: Pt is a 72 year old male with medical hx significant for: pancreas and kidney transplant, DM II with retinopathy, HTN, L BKA, OSA. Pt presented to [facility] on 06/06/23 d/t generalized weakness, fatigue, tremors and chills. Pt also had wound on bottom of right foot (been there for months). Pt met sepsis criteria. Chest x-ray negative and UA unremarkable. Empiric antibiotics started. Orthopedics consulted  d/t ulceration on right foot. MRI foot showed osteomyelitis of right great toe. ABI showed PVD. Vascular Surgery consulted to optimize circulation prior to proceeding with toe amputation. Recommended angiogram. Not able to be performed d/t nausea and dysphagia. Gastroenterology consulted d/t intractable nausea and vomiting. Recommended EGD. Not able to be performed d/t elevated BP and diaphoresis. Workup after rapid response revealed small acute perforator infarct in right centrum semiovale on 9/13. MRA negative. Carotid doppler unremarkable. Echo revealed EF 70-75%. Pt underwent abdominal aortogram, RLE angiogram, angioplasty right anterior tibial artery on 06/15/23 by Dr. Myra Gianotti. It was determined there were no revascularization options for right foot. Pt underwent right BKA on 06/18/23 by Dr. Lajoyce Corners. Therapy evaluations completed and CIR recommended d/t pt's deficits in functional mobility. Complete NIHSS TOTAL: 1 Glasgow Coma Scale Score: 15  Patient's medical record from [facility] has been reviewed by the rehabilitation admission coordinator and physician.  Past Medical History  Past Medical History:  Diagnosis Date   Anxiety    COVID-19 virus infection 09/2020   CPAP (continuous positive airway pressure) dependence    Diabetes mellitus without complication (HCC)    GERD (gastroesophageal reflux disease)    Hypertension    Legally blind in left eye, as defined in  [place]    PTSD (post-traumatic stress disorder)    PVD (peripheral vascular disease) (HCC) 01/17/2015   Renal disorder    Sleep apnea    Thyroid disease     Has the patient had major surgery during 100 days prior to admission? Yes  Family History  family history includes Hypertension in an other family member.   Current Medications   Current Facility-Administered Medications:    0.9 %  sodium chloride infusion, 250 mL, Intravenous, PRN, Nadara Mustard, MD   0.9 %  sodium chloride infusion, , Intravenous, Continuous,  Nadara Mustard, MD   acetaminophen (TYLENOL) tablet 650 mg, 650 mg, Oral, Q6H PRN, 650 mg at 06/18/23 2054 **OR** acetaminophen (TYLENOL) suppository 650 mg, 650 mg, Rectal, Q6H PRN, Nadara Mustard, MD   alum & mag hydroxide-simeth (MAALOX/MYLANTA) 200-200-20 MG/5ML suspension 15-30 mL, 15-30 mL, Oral, Q2H PRN, Nadara Mustard, MD   amLODipine (NORVASC) tablet 5 mg, 5 mg, Oral, Daily, Nadara Mustard, MD, 5 mg at 06/19/23 1610   ascorbic acid (VITAMIN C) tablet 1,000 mg, 1,000 mg, Oral, Daily, Nadara Mustard, MD, 1,000 mg at 06/19/23 9604   aspirin EC tablet 81 mg, 81 mg, Oral, Daily, Nadara Mustard, MD, 81 mg at 06/19/23 5409   atorvastatin (LIPITOR) tablet 80 mg, 80 mg, Oral, QHS, Nadara Mustard, MD, 80 mg at 06/18/23 2054   bisacodyl (DULCOLAX) EC tablet 5 mg, 5 mg, Oral, Daily PRN, Nadara Mustard, MD   ceFEPIme (MAXIPIME) 2 g in sodium chloride 0.9 % 100 mL IVPB, 2 g, Intravenous, Q12H, Roslyn Smiling, RPH   clopidogrel (PLAVIX) tablet 75 mg, 75 mg, Oral, Q breakfast, Nadara Mustard, MD, 75 mg at 06/19/23 8119   docusate sodium (COLACE) capsule 100 mg, 100 mg, Oral, Daily, Nadara Mustard, MD, 100 mg at 06/19/23 0938   enoxaparin (LOVENOX) injection 40 mg, 40 mg, Subcutaneous, Q24H, Nadara Mustard, MD, 40 mg at 06/19/23 1478   furosemide (LASIX) tablet 20 mg, 20 mg, Oral, Daily, Nadara Mustard, MD, 20 mg at 06/19/23 0939   guaiFENesin-dextromethorphan (ROBITUSSIN DM) 100-10 MG/5ML syrup 15 mL, 15 mL, Oral, Q4H PRN, Nadara Mustard, MD   hydrALAZINE (APRESOLINE) injection 10 mg, 10 mg, Intravenous, Q4H PRN, Nadara Mustard, MD, 10 mg at 06/18/23 1816   HYDROmorphone (DILAUDID) injection 0.5-1 mg, 0.5-1 mg, Intravenous, Q4H PRN, Nadara Mustard, MD, 1 mg at 06/19/23 1223   insulin aspart (novoLOG) injection 0-6 Units, 0-6 Units, Subcutaneous, TID WC, Nadara Mustard, MD, 1 Units at 06/19/23 1225   insulin glargine-yfgn (SEMGLEE) injection 10 Units, 10 Units, Subcutaneous, Daily, Nadara Mustard, MD, 10  Units at 06/19/23 0947   ipratropium-albuterol (DUONEB) 0.5-2.5 (3) MG/3ML nebulizer solution 3 mL, 3 mL, Nebulization, Q4H PRN, Nadara Mustard, MD   labetalol (NORMODYNE) injection 10 mg, 10 mg, Intravenous, Q10 min PRN, Nadara Mustard, MD   leptospermum manuka honey (MEDIHONEY) paste 1 Application, 1 Application, Topical, Daily, Nadara Mustard, MD, 1 Application at 06/17/23 1051   losartan (COZAAR) tablet 50 mg, 50 mg, Oral, Daily, Nadara Mustard, MD, 50 mg at 06/19/23 2956   magnesium citrate solution 1 Bottle, 1 Bottle, Oral, Once PRN, Nadara Mustard, MD   magnesium sulfate IVPB 2 g 50 mL, 2 g, Intravenous, Daily PRN, Nadara Mustard, MD   melatonin tablet 3 mg, 3 mg, Oral, QHS PRN, Nadara Mustard, MD   metoprolol tartrate (LOPRESSOR) injection 5 mg, 5  mg, Intravenous, Q4H PRN, Nadara Mustard, MD, 5 mg at 06/11/23 0932   mycophenolate (CELLCEPT) capsule 1,000 mg, 1,000 mg, Oral, BID, Nadara Mustard, MD, 1,000 mg at 06/19/23 6962   nutrition supplement (JUVEN) (JUVEN) powder packet 1 packet, 1 packet, Oral, BID BM, Nadara Mustard, MD, 1 packet at 06/19/23 0954   ondansetron [facility]) injection 4 mg, 4 mg, Intravenous, Q6H PRN, Nadara Mustard, MD, 4 mg at 06/19/23 1520   oxyCODONE (Oxy IR/ROXICODONE) immediate release tablet 10-15 mg, 10-15 mg, Oral, Q4H PRN, Nadara Mustard, MD, 15 mg at 06/19/23 1003   oxyCODONE (Oxy IR/ROXICODONE) immediate release tablet 5-10 mg, 5-10 mg, Oral, Q4H PRN, Nadara Mustard, MD, 10 mg at 06/19/23 0204   pantoprazole (PROTONIX) injection 40 mg, 40 mg, Intravenous, Q12H, Nadara Mustard, MD, 40 mg at 06/19/23 0939   phenol (CHLORASEPTIC) mouth spray 1 spray, 1 spray, Mouth/Throat, PRN, Nadara Mustard, MD   polyethylene glycol (MIRALAX / GLYCOLAX) packet 17 g, 17 g, Oral, Daily PRN, Nadara Mustard, MD   potassium chloride SA (KLOR-CON M) CR tablet 20 mEq, 20 mEq, Oral, Daily, Nadara Mustard, MD, 20 mEq at 06/19/23 9528   potassium chloride SA (KLOR-CON M) CR tablet 20-40  mEq, 20-40 mEq, Oral, Daily PRN, Nadara Mustard, MD   sodium chloride flush (NS) 0.9 % injection 3 mL, 3 mL, Intravenous, Q12H, Nadara Mustard, MD, 3 mL at 06/18/23 2134   sodium chloride flush (NS) 0.9 % injection 3 mL, 3 mL, Intravenous, PRN, Nadara Mustard, MD   tacrolimus (PROGRAF) capsule 5 mg, 5 mg, Oral, BID, Nadara Mustard, MD, 5 mg at 06/19/23 1002   traZODone (DESYREL) tablet 50 mg, 50 mg, Oral, QHS PRN, Nadara Mustard, MD, 50 mg at 06/18/23 2054   vancomycin (VANCOREADY) IVPB 1250 mg/250 mL, 1,250 mg, Intravenous, Q24H, Nadara Mustard, MD, Last Rate: 166.7 mL/hr at 06/19/23 0958, 1,250 mg at 06/19/23 4132   Vitamin D (Ergocalciferol) (DRISDOL) 1.25 MG (50000 UNIT) capsule 50,000 Units, 50,000 Units, Oral, Q Wed, Nadara Mustard, MD, 50,000 Units at 06/16/23 4401   zinc sulfate capsule 220 mg, 220 mg, Oral, Daily, Nadara Mustard, MD, 220 mg at 06/19/23 0272  Patients Current Diet:  Diet Order             Diet Carb Modified Fluid consistency: Thin; Room service appropriate? No  Diet effective now                   Precautions / Restrictions Precautions Precautions: Fall, Other (comment) Precaution Comments: watch HR, vision impairment Other Brace: L prosthesis (unable to don this date, release button locked up) Restrictions Weight Bearing Restrictions: No   Has the patient had 2 or more falls or a fall with injury in the past year?No  Prior Activity Level Community (5-7x/wk): gets out of house ~3 days/week  Prior Functional Level Prior Function Prior Level of Function : Independent/Modified Independent Mobility Comments: At baseline, pt wears prosthesis every day, ambulates with SPC. Since admitted, pt has been light assist for transfers ADLs Comments: At baseline pt is Independent with ADLs.  Self Care: Did the patient need help bathing, dressing, using the toilet or eating?  Needed some help  Indoor Mobility: Did the patient need assistance with walking from room  to room (with or without device)? Independent  Stairs: Did the patient need assistance with internal or external stairs (with or without device)? Needed some help  Functional Cognition: Did the patient need help planning regular tasks such as shopping or remembering to take medications? Needed some help  Patient Information Are you of Hispanic, Latino/a,or Spanish origin?: A. No, not of Hispanic, Latino/a, or Spanish origin What is your race?: B. Black or African American Do you need or want an interpreter to communicate with a doctor or health care staff?: 0. No  Patient's Response To:  Health Literacy and Transportation Is the patient able to respond to health literacy and transportation needs?: Yes Health Literacy - How often do you need to have someone help you when you read instructions, pamphlets, or other written material from your doctor or pharmacy?: Rarely In the past 12 months, has lack of transportation kept you from medical appointments or from getting medications?: No In the past 12 months, has lack of transportation kept you from meetings, work, or from getting things needed for daily living?: No  Journalist, newspaper / Equipment Home Assistive Devices/Equipment: Medical laboratory scientific officer (specify quad or straight), CPAP, Bedside commode/3-in-1, CBG Meter, Eyeglasses, Grab bars around toilet, Grab bars in shower, Long-handled shoehorn, Scales, Shower chair with back, Environmental consultant (specify type) (straight cane,left leg prothesis) Home Equipment: Cane - single point, Shower seat, Grab bars - toilet, Grab bars - tub/shower, BSC/3in1, Standard Walker  Prior Device Use: Indicate devices/aids used by the patient prior to current illness, exacerbation or injury? Orthotics/Prosthetics and cane  Current Functional Level Cognition  Arousal/Alertness: Awake/alert Overall Cognitive Status: No family/caregiver present to determine baseline cognitive functioning Orientation Level: Oriented X4 General Comments:  AAOx4 and able to follow 1-step commands consistantly. Emergent awareness and Sustained attention. Pt can be tangential in conversation and requires occasional cues to maintain attention to task. Decreased safety awareness and decreased insight into deficits. Attention: Sustained Sustained Attention: Appears intact Memory: Impaired Memory Impairment: Retrieval deficit Awareness: Appears intact Problem Solving: Impaired Problem Solving Impairment: Verbal basic    Extremity Assessment (includes Sensation/Coordination)  Upper Extremity Assessment: Right hand dominant, Generalized weakness, RUE deficits/detail, LUE deficits/detail RUE Deficits / Details: decreased AROM shoulder flexion to approx. 100 degrees at baseline; generalized weakness LUE Deficits / Details: mildly impaired AROM shoulder flexion at baseline but WFL; generalized weaknses  Lower Extremity Assessment: Defer to PT evaluation RLE Deficits / Details: post-op BKA; able to perform quad set with 0 deg extension achieved, SLR without quad lag RLE Sensation: decreased light touch, history of peripheral neuropathy RLE Coordination: WNL LLE Deficits / Details: WFL given BKA LLE Sensation: WNL LLE Coordination: WNL    ADLs  Overall ADL's : Needs assistance/impaired Eating/Feeding: Independent, Sitting Eating/Feeding Details (indicate cue type and reason): requires orientation to tray due to vision impairment Grooming: Set up, Sitting Upper Body Bathing: Contact guard assist, Sitting Upper Body Bathing Details (indicate cue type and reason): simulated Lower Body Bathing: Moderate assistance, Sitting/lateral leans Lower Body Bathing Details (indicate cue type and reason): simulated Upper Body Dressing : Contact guard assist, Sitting Lower Body Dressing: Moderate assistance, Sitting/lateral leans Toilet Transfer: Minimal assistance, +2 for safety/equipment, BSC/3in1, Requires drop arm (lateral-scoot) Toilet Transfer Details  (indicate cue type and reason): simulated task Toileting- Clothing Manipulation and Hygiene: Moderate assistance, Sitting/lateral lean Toileting - Clothing Manipulation Details (indicate cue type and reason): with increased time Functional mobility during ADLs: Minimal assistance, +2 for safety/equipment, Rolling walker (2 wheels)    Mobility  Overal bed mobility: Needs Assistance Bed Mobility: Supine to Sit, Sit to Supine Supine to sit: Min assist, +2 for safety/equipment Sit to supine: Mod assist, +2  for physical assistance General bed mobility comments: min assist +2 supine to sit for trunk elevation, steadying, scoot to EOB. Mod +2 for return to supine for LE lifting, trunk lower, and boost up in bed with bed pad.    Transfers  Overall transfer level: Needs assistance Equipment used: Rolling walker (2 wheels) Transfers: Bed to chair/wheelchair/BSC Sit to Stand: Contact guard assist Bed to/from chair/wheelchair/BSC transfer type:: Lateral/scoot transfer Step pivot transfers: Contact guard assist  Lateral/Scoot Transfers: Min assist, +2 safety/equipment General transfer comment: assist for hip translation, steadying    Ambulation / Gait / Stairs / Wheelchair Mobility  Ambulation/Gait General Gait Details: deferred for safety due to lack of +2 for chair follow and slight dizziness Pre-gait activities: marching    Posture / Balance Dynamic Sitting Balance Sitting balance - Comments: sitting EOB and sitting up in recliner Balance Overall balance assessment: Needs assistance Sitting-balance support: No upper extremity supported, Feet supported Sitting balance-Leahy Scale: Fair Sitting balance - Comments: sitting EOB and sitting up in recliner Standing balance support: Bilateral upper extremity supported, During functional activity, Reliant on assistive device for balance Standing balance-Leahy Scale: Poor Standing balance comment: unable to attempt as pt could not get prosthetic on  and s/p R BKA    Special needs/care consideration Continuous Drip IV  n/a, Wound Vac leg/right, Skin Surgical incision: leg/right, and Diabetic management Novolog 0-6 units 3x daily with meals; Semglee 10 units daily     Previous Home Environment (from acute therapy documentation) Living Arrangements: Spouse/significant other, Other relatives (mother-in-law)  Lives With: Spouse, Family Available Help at Discharge: Family, Available 24 hours/day Type of Home: Apartment Home Layout: One level Home Access: Level entry Entrance Stairs-Number of Steps: 1 Bathroom Shower/Tub: Armed forces operational officer Accessibility: Yes How Accessible: Accessible via walker, Accessible via wheelchair Home Care Services: No  Discharge Living Setting Plans for Discharge Living Setting: Patient's home (moving to a new place) Type of Home at Discharge: Apartment Discharge Home Layout: One level Discharge Home Access: Stairs to enter Entrance Stairs-Rails: Right, Left, Can reach both Entrance Stairs-Number of Steps: 5 steps in front, 4 steps in back Discharge Bathroom Shower/Tub: Tub/shower unit Discharge Bathroom Toilet: Standard Discharge Bathroom Accessibility: Yes How Accessible: Accessible via walker Does the patient have any problems obtaining your medications?: No  Social/Family/Support Systems Anticipated Caregiver: [name], wife Anticipated Caregiver's Contact Information: [phone] Caregiver Availability: 24/7 Discharge Plan Discussed with Primary Caregiver: Yes Is Caregiver In Agreement with Plan?: Yes Does Caregiver/Family have Issues with Lodging/Transportation while Pt is in Rehab?: No   Goals Patient/Family Goal for Rehab: 7-10 days Expected length of stay: PT/OT/SLP Supervision to Mod I  Pt/Family Agrees to Admission and willing to participate: Yes Program Orientation Provided & Reviewed with Pt/Caregiver Including Roles  & Responsibilities:  Yes   Decrease burden of Care through IP rehab admission: NA   Possible need for SNF placement upon discharge:Not anticipated   Patient Condition: This patient's medical and functional status has changed since the consult dated: 06/21/23 in which the Rehabilitation Physician determined and documented that the patient's condition is appropriate for intensive rehabilitative care in an inpatient rehabilitation facility. See "History of Present Illness" (above) for medical update. Functional changes are: Pt,. Now min A to CGA. Patient's medical and functional status update has been discussed with the Rehabilitation physician and patient remains appropriate for inpatient rehabilitation. Will admit to inpatient rehab today.  Preadmission Screen Completed By:  Domingo Pulse, CCC-SLP, 06/19/2023 3:55 PM ______________________________________________________________________  Discussed status with Dr. Riley Kill on 06/29/23 at 930 and received approval for admission today.  Admission Coordinator:  Domingo Pulse, time 1120 Dorna Bloom 06/29/23

## 2023-06-19 NOTE — Progress Notes (Signed)
Patient ID: [name]., male   DOB: 24-Jun-1951, 72 y.o.   MRN: [id number] Patient is postoperative day 1 transtibial amputation on the right.  There is no drainage in the wound VAC canister this does not have a sufficient suction seal for a Praveena pump.  Patient states he wants to go home.  Patient to be evaluated with physical therapy and may discharge to home once he is safe from a physical therapy standpoint.

## 2023-06-19 NOTE — Progress Notes (Addendum)
Inpatient Rehab Admissions:  Inpatient Rehab Consult received.  I met with patient at the bedside for rehabilitation assessment and to discuss goals and expectations of an inpatient rehab admission.  Discussed average length of stay, insurance authorization requirement, discharge home after completion of CIR. Pt acknowledged interest in pursuing CIR. Pt gave permission to contact family to determine dispo. Called spouse, [name] but no one answered. Left a message; awaiting return call. Will continue to follow.  1528: [name] returned call. Discussed CIR goals and expectations. Discussed average length of stay, insurance authorization requirement, discharge home after completion of CIR. She acknowledged understanding. She is supportive of pt pursuing CIR. She confirmed that she will be able to provide 24/7 support for pt after discharge.   Signed: Wolfgang Phoenix, MS, CCC-SLP Admissions Coordinator [phone]

## 2023-06-20 ENCOUNTER — Inpatient Hospital Stay (HOSPITAL_COMMUNITY): Payer: Medicare HMO

## 2023-06-20 DIAGNOSIS — M869 Osteomyelitis, unspecified: Secondary | ICD-10-CM | POA: Diagnosis not present

## 2023-06-20 DIAGNOSIS — R6883 Chills (without fever): Secondary | ICD-10-CM | POA: Diagnosis not present

## 2023-06-20 DIAGNOSIS — I639 Cerebral infarction, unspecified: Secondary | ICD-10-CM | POA: Diagnosis not present

## 2023-06-20 LAB — COMPREHENSIVE METABOLIC PANEL
ALT: 29 U/L (ref 0–44)
AST: 25 U/L (ref 15–41)
Albumin: 2.9 g/dL — ABNORMAL LOW (ref 3.5–5.0)
Alkaline Phosphatase: 79 U/L (ref 38–126)
Anion gap: 13 (ref 5–15)
BUN: 18 mg/dL (ref 8–23)
CO2: 22 mmol/L (ref 22–32)
Calcium: 9.7 mg/dL (ref 8.9–10.3)
Chloride: 103 mmol/L (ref 98–111)
Creatinine, Ser: 1.35 mg/dL — ABNORMAL HIGH (ref 0.61–1.24)
GFR, Estimated: 56 mL/min — ABNORMAL LOW (ref >60)
Glucose, Bld: 74 mg/dL (ref 70–99)
Potassium: 4.5 mmol/L (ref 3.5–5.1)
Sodium: 138 mmol/L (ref 135–145)
Total Bilirubin: 0.7 mg/dL (ref 0.3–1.2)
Total Protein: 6.4 g/dL — ABNORMAL LOW (ref 6.5–8.1)

## 2023-06-20 LAB — CBC
HCT: 30.1 % — ABNORMAL LOW (ref 39.0–52.0)
Hemoglobin: 9.5 g/dL — ABNORMAL LOW (ref 13.0–17.0)
MCH: 25.3 pg — ABNORMAL LOW (ref 26.0–34.0)
MCHC: 31.6 g/dL (ref 30.0–36.0)
MCV: 80.1 fL (ref 80.0–100.0)
Platelets: 220 10*3/uL (ref 150–400)
RBC: 3.76 MIL/uL — ABNORMAL LOW (ref 4.22–5.81)
RDW: 14.9 % (ref 11.5–15.5)
WBC: 14.8 10*3/uL — ABNORMAL HIGH (ref 4.0–10.5)
nRBC: 0 % (ref 0.0–0.2)

## 2023-06-20 LAB — GLUCOSE, CAPILLARY
Glucose-Capillary: 71 mg/dL (ref 70–99)
Glucose-Capillary: 96 mg/dL (ref 70–99)
Glucose-Capillary: 128 mg/dL — ABNORMAL HIGH (ref 70–99)
Glucose-Capillary: 160 mg/dL — ABNORMAL HIGH (ref 70–99)

## 2023-06-20 MED ORDER — LACTATED RINGERS IV SOLN: INTRAVENOUS | Status: DC

## 2023-06-20 MED ORDER — METOCLOPRAMIDE HCL 5 MG/ML IJ SOLN
5.0000 mg | Freq: Three times a day (TID) | INTRAMUSCULAR | Status: AC
  Administered 2023-06-20 – 2023-06-25 (×16): 5 mg via INTRAVENOUS
  Filled 2023-06-20 (×13): qty 2

## 2023-06-20 NOTE — Progress Notes (Addendum)
Progress Note   Patient: [name]. WUX:[id number] DOB: 06-28-1951 DOA: 06/06/2023     14 DOS: the patient was seen and examined on 06/20/2023   Brief hospital course: [name] is a 72 y.o. M with hx renal/pancreas transplant, HTN, DM, L BKA, and chronic right foot wound who presented with chills.    MRI of the foot was consistent with osteomyelitis.  Ortho consulted.  Vascular surgery consulted, performed angiogram that showed poor runoff to the right foot, BKA recommended.    Hospitalization complicated by acute right blurry vision, found to be due to stroke.      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 Ortho recommended 24h abx from surgery -Orthopedic Surgery and Vascular following.  -Now s/p R BKA 9/20 - Therapy recs for acute inpatient rehab, pending   Diabetes - Continue semglee 10 units daily - Continue sliding scale corrections - glycemic trends stable   Pancreas and kidney transplant -Continue mycophenolate and tacrolimus   Acute stroke -Continue aspirin, Lipitor, Plavix   Hypertension Blood pressure controlled - Continue amlodipine, Lasix, losartan   Hyperlipidemia -Continue Lipitor   Subjective: Somewhat confused immediately after being awakened this AM. No other complaints  Physical Exam: Vitals:   06/20/23 0003 06/20/23 0327 06/20/23 0804 06/20/23 1110  BP: 116/63 (!) 159/66 (!) 149/64 (!) 156/66  Pulse: 76 95  93  Resp: 19 17 13 19   Temp: 98.4 F (36.9 C) 98.1 F (36.7 C) 98.5 F (36.9 C) 98 F (36.7 C)  TempSrc: Oral Oral Oral Oral  SpO2: 99% 99% 99% 99%  Weight:      Height:       General exam: Awake, laying in bed, in nad Respiratory system: Normal respiratory effort, no wheezing Cardiovascular system: regular rate, s1, s2 Gastrointestinal system: Soft, nondistended, positive BS Central  nervous system: CN2-12 grossly intact, strength intact Extremities: Perfused, no clubbing Skin: Normal skin turgor, no notable skin lesions seen Psychiatry: Mood normal // no visual hallucinations   Data Reviewed:  Labs reviewed: Na 138, K 4.5, Cr 1.35, WBC 14.8, Hgb 9.5, Plts 220   Family Communication: Pt in room, family not at bedside  Disposition: Status is: Inpatient Remains inpatient appropriate because: severity of illness  Planned Discharge Destination: Rehab     Author: Rickey [name], MD 06/20/2023 2:14 PM  For on call review [email or web address].

## 2023-06-20 NOTE — Plan of Care (Signed)
Problem: Education: Goal: Ability to describe self-care measures that may prevent or decrease complications (Diabetes Survival Skills Education) will improve Outcome: Progressing

## 2023-06-20 NOTE — Plan of Care (Signed)
  Problem: Education: Goal: Knowledge of General Education information will improve Description Including pain rating scale, medication(s)/side effects and non-pharmacologic comfort measures Outcome: Progressing   

## 2023-06-21 DIAGNOSIS — M869 Osteomyelitis, unspecified: Secondary | ICD-10-CM | POA: Diagnosis not present

## 2023-06-21 DIAGNOSIS — R1310 Dysphagia, unspecified: Secondary | ICD-10-CM | POA: Diagnosis not present

## 2023-06-21 DIAGNOSIS — R933 Abnormal findings on diagnostic imaging of other parts of digestive tract: Secondary | ICD-10-CM

## 2023-06-21 DIAGNOSIS — Z8719 Personal history of other diseases of the digestive system: Secondary | ICD-10-CM

## 2023-06-21 DIAGNOSIS — I639 Cerebral infarction, unspecified: Secondary | ICD-10-CM | POA: Diagnosis not present

## 2023-06-21 DIAGNOSIS — Z940 Kidney transplant status: Secondary | ICD-10-CM | POA: Diagnosis not present

## 2023-06-21 DIAGNOSIS — Z89511 Acquired absence of right leg below knee: Secondary | ICD-10-CM | POA: Diagnosis not present

## 2023-06-21 DIAGNOSIS — R6883 Chills (without fever): Secondary | ICD-10-CM | POA: Diagnosis not present

## 2023-06-21 LAB — CBC
HCT: 30.4 % — ABNORMAL LOW (ref 39.0–52.0)
Hemoglobin: 9.4 g/dL — ABNORMAL LOW (ref 13.0–17.0)
MCH: 25.1 pg — ABNORMAL LOW (ref 26.0–34.0)
MCHC: 30.9 g/dL (ref 30.0–36.0)
MCV: 81.3 fL (ref 80.0–100.0)
Platelets: 266 10*3/uL (ref 150–400)
RBC: 3.74 MIL/uL — ABNORMAL LOW (ref 4.22–5.81)
RDW: 15 % (ref 11.5–15.5)
WBC: 16.9 10*3/uL — ABNORMAL HIGH (ref 4.0–10.5)
nRBC: 0 % (ref 0.0–0.2)

## 2023-06-21 LAB — GLUCOSE, CAPILLARY
Glucose-Capillary: 87 mg/dL (ref 70–99)
Glucose-Capillary: 183 mg/dL — ABNORMAL HIGH (ref 70–99)
Glucose-Capillary: 121 mg/dL — ABNORMAL HIGH (ref 70–99)
Glucose-Capillary: 187 mg/dL — ABNORMAL HIGH (ref 70–99)

## 2023-06-21 LAB — COMPREHENSIVE METABOLIC PANEL
ALT: 29 U/L (ref 0–44)
AST: 38 U/L (ref 15–41)
Albumin: 3.1 g/dL — ABNORMAL LOW (ref 3.5–5.0)
Alkaline Phosphatase: 91 U/L (ref 38–126)
Anion gap: 15 (ref 5–15)
BUN: 28 mg/dL — ABNORMAL HIGH (ref 8–23)
CO2: 17 mmol/L — ABNORMAL LOW (ref 22–32)
Calcium: 9.9 mg/dL (ref 8.9–10.3)
Chloride: 105 mmol/L (ref 98–111)
Creatinine, Ser: 1.32 mg/dL — ABNORMAL HIGH (ref 0.61–1.24)
GFR, Estimated: 58 mL/min — ABNORMAL LOW (ref >60)
Glucose, Bld: 118 mg/dL — ABNORMAL HIGH (ref 70–99)
Potassium: 4.5 mmol/L (ref 3.5–5.1)
Sodium: 137 mmol/L (ref 135–145)
Total Bilirubin: 1.1 mg/dL (ref 0.3–1.2)
Total Protein: 7 g/dL (ref 6.5–8.1)

## 2023-06-21 MED ORDER — BISACODYL 10 MG RE SUPP
10.0000 mg | Freq: Once | RECTAL | Status: AC
  Administered 2023-06-21: 10 mg via RECTAL
  Filled 2023-06-21: qty 1

## 2023-06-21 MED ORDER — DOCUSATE SODIUM 100 MG PO CAPS
100.0000 mg | ORAL_CAPSULE | Freq: Two times a day (BID) | ORAL | Status: DC
  Administered 2023-06-21 – 2023-06-23 (×4): 100 mg via ORAL
  Filled 2023-06-21 (×4): qty 1

## 2023-06-21 MED ORDER — INSULIN GLARGINE-YFGN 100 UNIT/ML ~~LOC~~ SOLN
7.0000 [IU] | Freq: Every day | SUBCUTANEOUS | Status: DC
  Administered 2023-06-22 – 2023-06-29 (×8): 7 [IU] via SUBCUTANEOUS
  Filled 2023-06-21 (×8): qty 0.07

## 2023-06-21 MED ORDER — POLYETHYLENE GLYCOL 3350 17 G PO PACK
17.0000 g | PACK | Freq: Two times a day (BID) | ORAL | Status: DC
  Administered 2023-06-21 – 2023-06-29 (×14): 17 g via ORAL
  Filled 2023-06-21 (×14): qty 1

## 2023-06-21 NOTE — Anesthesia Postprocedure Evaluation (Signed)
Anesthesia Post Note  Patient: [name].  Procedure(s) Performed: RIGHT BELOW KNEE AMPUTATION (Right: Knee)     Patient location during evaluation: PACU Anesthesia Type: General Level of consciousness: sedated and patient cooperative Pain management: pain level controlled Vital Signs Assessment: post-procedure vital signs reviewed and stable Respiratory status: spontaneous breathing Cardiovascular status: stable Anesthetic complications: no   No notable events documented.  Last Vitals:  Vitals:   06/21/23 0428 06/21/23 0830  BP: (!) 139/54 (!) 192/85  Pulse: 92 (!) 106  Resp: 18 20  Temp: 37.2 C   SpO2: 100% 99%    Last Pain:  Vitals:   06/21/23 0847  TempSrc:   PainSc: 10-Worst pain ever                 Lewie Loron

## 2023-06-21 NOTE — Progress Notes (Signed)
Progress Note   Patient: [name]. BMW:[id number] DOB: [birth year] DOA: 06/06/2023     15 DOS: the patient was seen and examined on 06/21/2023   Brief hospital course: [name] is a 72 y.o. M with hx renal/pancreas transplant, HTN, DM, L BKA, and chronic right foot wound who presented with chills.    MRI of the foot was consistent with osteomyelitis.  Ortho consulted.  Vascular surgery consulted, performed angiogram that showed poor runoff to the right foot, BKA recommended.    Hospitalization complicated by acute right blurry vision, found to be due to stroke.      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 Ortho recommended 24h abx from surgery -Orthopedic Surgery and Vascular following.  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 GI was consulted on 9/12 and planned EGD , however the pt had a stroke on 9/14 and so GI work up was held -Overnight, pt was noted to have recurrent nausea and dry heaving. Family reports issues with tolerating oral medications and food still -Have reached back ou to GI to reassess given recurrent symptoms   Subjective: Somewhat confused immediately after being awakened this AM. No other complaints  Physical Exam: Vitals:   06/21/23 0428 06/21/23 0830 06/21/23 1144 06/21/23  1146  BP: (!) 139/54 (!) 192/85  138/89  Pulse: 92 (!) 106  92  Resp: 18 20  16   Temp: 99 F (37.2 C)   97.7 F (36.5 C)  TempSrc: Oral  Oral Oral  SpO2: 100% 99%  99%  Weight:      Height:       General exam: Awake, laying in bed, in nad Respiratory system: Normal respiratory effort, no wheezing Cardiovascular system: regular rate, s1, s2 Gastrointestinal system: Soft, nondistended, positive BS Central nervous system: CN2-12 grossly intact, strength intact Extremities: Perfused, no clubbing Skin: Normal skin turgor, no notable skin lesions seen Psychiatry: Mood normal // no visual hallucinations   Data Reviewed:  Labs reviewed: Na 137, K 4.5, Cr 1.32, WBC 16.9, hgb 9.4, WBC 266   Family Communication: Pt in room, family at bedside  Disposition: Status is: Inpatient Remains inpatient appropriate because: severity of illness  Planned Discharge Destination: Rehab     Author: Rickey [name], MD 06/21/2023 2:35 PM  For on call review [email or web address].

## 2023-06-21 NOTE — Progress Notes (Signed)
Daily Progress Note  DOA: 06/06/2023 Hospital Day: 16  Chief Complaint: dysphagia, ? vomiting  ASSESSMENT & PLAN   Brief Narrative:  [name]. is a 72 y.o. year old male with a history of  renal and pancreatic transplant at [facility],  DM, hypertension, left leg BKA, obstructive sleep apnea, and a chronic right foot wound. Hospitalized 9/8 with sepsis 2/2 R foot osteomyelitis. GI evaluated 9/12 for N/V. / chronic dysphagia and abnormal barium swallow . Plan was for EGD but procedure cancelled by Anesthesia due to chills, tachycardia and elevated BP. MRI brain showed acute CVA and GI signed off. Called back to see again today for heaving and dysphagia  Chronic dysphagia / ? Vomiting / history of peptic stricture s/p esophageal dilation in 2022 / abnormal barium swallow this admission showing tertiary contractions and poor progression of contrast leading to severe dysmotility and incomplete esophageal emptying and inability to pass a 13 mm tablet .   Difficult to decipher exact symptoms. I woke him up for this visit. He is a little confused and details about symptoms are unclear. Overall sounds like he does regurgitate some pills and some food as well as phlegm. Query recurrent esophageal stricture and /or just severe dysmotility. Also, hard to determine if he is truly having nausea / vomiting or mainly just regurgitation of esophageal contents. He is at increased risk for sedation / procedures given recent CVA. Additionally, he is taking Plavix which would preclude dilation of esophagus if we did pursue EGD. SLP has cleared him for a regular diet but he is currently is on clear liquids ( maybe didn't tolerate solids and put back on clears).  Plan Will discuss EGD with Dr. Leonides Schanz but again he is at increased risk. Could consider a diagnostic EGD to evaluate ? N/V but may not make sense if we cannot concurrently dilate esophagus ( if stricture was found)   Subjective   Awoken  from sleep. A little confused. A little agitated about his phone being wrapped up in the sheets and sent to laundry services? Describes always feeling like something is in his throat. Feels like juice gets stuck. He thinks solid food gets stuck in esophagus. He has phlegm in throat.    Objective    Barium swallow study this admission showed tertiary contractions and poor progression of contrast leading to severe dysmotility and incomplete esophageal emptying and inability to pass a 13 mm tablet    EGD 03/08/2021 by Dr. Marina Goodell: 1. GERD complicated by peptic stricture status post dilation  2. Reflux esophagitis  3. Atrophic gastric mucosa with several superficial erosions. Status post biopsies. - Gastric oxyntic mucosa with mild atrophy and mild chronic gastritis  - Warthin Starry stain is negative for Helicobacter pylori   Recent Labs    06/19/23 0334 06/20/23 0318  WBC 12.2* 14.8*  HGB 8.6* 9.5*  HCT 26.8* 30.1*  PLT 187 220   BMET Recent Labs    06/19/23 0334 06/20/23 0318  NA 131* 138  K 4.1 4.5  CL 105 103  CO2 19* 22  GLUCOSE 283* 74  BUN 16 18  CREATININE 1.46* 1.35*  CALCIUM 8.4* 9.7   LFT Recent Labs    06/20/23 0318  PROT 6.4*  ALBUMIN 2.9*  AST 25  ALT 29  ALKPHOS 79  BILITOT 0.7   PT/INR No results for input(s): "LABPROT", "INR" in the last 72 hours.   Imaging:  DG Abd 1 View CLINICAL DATA:  [id number]  with nausea and vomiting, and weakness.  EXAM: ABDOMEN - 1 VIEW  COMPARISON:  Last abdomen and pelvis CT was 02/22/2021, last abdomen series was 01/25/2020.  FINDINGS: The bowel gas pattern is nonobstructive, with moderately dense stool with contrast, mild-to-moderate amount in the large bowel with aeration through into the rectum.  There is no supine evidence of free air. Postsurgical changes are present right mid abdomen. Heavy iliofemoral calcific arteriosclerosis.  No radio-opaque calculi or other significant radiographic abnormality are  seen. Lung bases are clear.  IMPRESSION: 1. Nonobstructive bowel gas pattern. 2. Moderate dense stool burden with contrast. 3. Heavy iliofemoral calcific arteriosclerosis.  Electronically Signed   By: Almira Bar M.D.   On: 06/20/2023 22:22     Scheduled inpatient medications:   amLODipine  5 mg Oral Daily   vitamin C  1,000 mg Oral Daily   aspirin EC  81 mg Oral Daily   atorvastatin  80 mg Oral QHS   clopidogrel  75 mg Oral Q breakfast   docusate sodium  100 mg Oral BID   enoxaparin (LOVENOX) injection  40 mg Subcutaneous Q24H   furosemide  20 mg Oral Daily   insulin aspart  0-6 Units Subcutaneous TID WC   insulin glargine-yfgn  10 Units Subcutaneous Daily   leptospermum manuka honey  1 Application Topical Daily   losartan  50 mg Oral Daily   metoCLOPramide (REGLAN) injection  5 mg Intravenous Q8H   mycophenolate  1,000 mg Oral BID   nutrition supplement (JUVEN)  1 packet Oral BID BM   pantoprazole (PROTONIX) IV  40 mg Intravenous Q12H   polyethylene glycol  17 g Oral BID   potassium chloride SA  20 mEq Oral Daily   sodium chloride flush  3 mL Intravenous Q12H   tacrolimus  5 mg Oral BID   Vitamin D (Ergocalciferol)  50,000 Units Oral Q Wed   zinc sulfate  220 mg Oral Daily   Continuous inpatient infusions:   sodium chloride     lactated ringers 75 mL/hr at 06/21/23 0841   magnesium sulfate bolus IVPB     PRN inpatient medications: sodium chloride, acetaminophen **OR** acetaminophen, alum & mag hydroxide-simeth, bisacodyl, guaiFENesin-dextromethorphan, hydrALAZINE, HYDROmorphone (DILAUDID) injection, ipratropium-albuterol, labetalol, magnesium citrate, magnesium sulfate bolus IVPB, melatonin, metoprolol tartrate, ondansetron, oxyCODONE, oxyCODONE, phenol, potassium chloride, sodium chloride flush, traZODone  Vital signs in last 24 hours: Temp:  [97.9 F (36.6 C)-99 F (37.2 C)] 99 F (37.2 C) (09/23 0428) Pulse Rate:  [92-126] 106 (09/23 0830) Resp:  [13-20] 20  (09/23 0830) BP: (107-192)/(54-86) 192/85 (09/23 0830) SpO2:  [97 %-100 %] 99 % (09/23 0830) Last BM Date : 06/18/23  Intake/Output Summary (Last 24 hours) at 06/21/2023 1118 Last data filed at 06/21/2023 0900 Gross per 24 hour  Intake 866.36 ml  Output 800 ml  Net 66.36 ml    Intake/Output from previous day: 09/22 0701 - 09/23 0700 In: 866.4 [I.V.:666.4; IV Piggyback:200] Out: 400 [Urine:400] Intake/Output this shift: Total I/O In: -  Out: 400 [Urine:400]   Physical Exam:  General: Alert male in NAD Heart:  Regular rate and rhythm.  Pulmonary: Normal respiratory effort Abdomen: Soft, nondistended, nontender. Normal bowel sounds. Neurologic: Alert and oriented Psych: Mildly agitated but cooperative. Insight appears normal.    Principal Problem:   Osteomyelitis of great toe of right foot (HCC) Active Problems:   PAD (peripheral artery disease) (HCC)   S/P BKA (below knee amputation) unilateral (HCC)   OSA on CPAP   History  of simultaneous kidney and pancreas transplant (HCC)     LOS: 15 days   Willette Cluster ,NP 06/21/2023, 11:18 AM

## 2023-06-21 NOTE — Inpatient Diabetes Management (Signed)
Inpatient Diabetes Program Recommendations  AACE/ADA: New Consensus Statement on Inpatient Glycemic Control (2015)  Target Ranges:  Prepandial:   less than 140 mg/dL      Peak postprandial:   less than 180 mg/dL (1-2 hours)      Critically ill patients:  140 - 180 mg/dL   Lab Results  Component Value Date   GLUCAP 87 06/21/2023   HGBA1C 11.1 (H) 06/07/2023    Latest Reference Range & Units 06/20/23 06:10 06/20/23 11:07 06/20/23 16:43 06/20/23 21:14 06/21/23 05:41  Glucose-Capillary 70 - 99 mg/dL 71 96 440 (H) 102 (H) 87  (H): Data is abnormally high  Home DM Meds: Lantus 28 units Daily     Metformin 500 mg bid    Current Orders: Novolog 0-6 units TID                            Semglee 8 units Daily  Inpatient Diabetes Program Recommendations:   Fasting CBG 71 yesterday and today's fasting 87. Please consider: -Decrease Semglee to 7 units daily  Thank you, Billy Fischer. Benny Deutschman, RN, MSN, CDE  Diabetes Coordinator Inpatient Glycemic Control Team Team Pager [phone] (8am-5pm) 06/21/2023 11:18 AM

## 2023-06-21 NOTE — Care Management Important Message (Signed)
Important Message  Patient Details  Name: [name]. MRN: [id number] Date of Birth: [birth year]   Medicare Important Message Given:  Yes     Sherilyn Banker 06/21/2023, 2:29 PM

## 2023-06-21 NOTE — Progress Notes (Signed)
Physical Therapy Treatment Patient Details Name: [name]. MRN: [id number] DOB: [birth year] Today's Date: 06/21/2023   History of Present Illness Pt is 72 yo male who presents on 06/06/23 with generalized weakness and fatigue with tremors and chills. Has a wound under R great toe and plans for 1st ray amputation. 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 PMH: kidney transplant, DM, GERD, HTN, PTSD, PVD, L eye blindness, sleep apnea, thyroid disease, L BKA    PT Comments  Patient received in bed, wife at bedside. Patient is agreeable to PT session. He requires min A for supine to sit edge of bed. Once stable on edge of bed he is able to scoot over to recliner with increased time and cues for safety/positioning. Supervision for safety. He is unable to get prosthetic working/ Lock appearst to be stuck. Patient making good progress however will need prosthetic fixed to progress to standing/ambulation.        If plan is discharge home, recommend the following: Help with stairs or ramp for entrance;Assistance with cooking/housework;Assist for transportation;A lot of help with walking and/or transfers;A lot of help with bathing/dressing/bathroom   Can travel by private vehicle        Equipment Recommendations  Wheelchair (measurements PT);Wheelchair cushion (measurements PT)    Recommendations for Other Services       Precautions / Restrictions Precautions Precautions: Fall Precaution Comments: watch HR, vision impairment Other Brace: L prosthesis (unable to don this date, release button locked up) Restrictions Weight Bearing Restrictions: No     Mobility  Bed Mobility Overal bed mobility: Needs Assistance Bed Mobility: Supine to Sit     Supine to sit: Min assist, HOB elevated, Used rails          Transfers Overall transfer level: Needs  assistance Equipment used: None Transfers: Bed to chair/wheelchair/BSC Sit to Stand: Supervision, Contact guard assist          Lateral/Scoot Transfers: Contact guard assist, Supervision General transfer comment: patient generally supervision for lateral scoot transfer to recliner. Increased time needed to complete and cues for safety.    Ambulation/Gait                   Stairs             Wheelchair Mobility     Tilt Bed    Modified Rankin (Stroke Patients Only) Modified Rankin (Stroke Patients Only) Pre-Morbid Rankin Score: Slight disability Modified Rankin: Moderately severe disability     Balance Overall balance assessment: Modified Independent Sitting-balance support: Bilateral upper extremity supported Sitting balance-Leahy Scale: Good         Standing balance comment: unable to attempt as pt could not get L LE prosthetic working and s/p R BKA                            Cognition Arousal: Alert Behavior During Therapy: WFL for tasks assessed/performed Overall Cognitive Status: Within Functional Limits for tasks assessed                                 General Comments: AAOx4 and able to follow 1-step commands consistantly. Emergent awareness and Sustained attention. Pt can be tangential in conversation and requires occasional cues to maintain attention to task.  Decreased safety awareness and decreased insight into deficits.        Exercises      General Comments        Pertinent Vitals/Pain Pain Assessment Faces Pain Scale: Hurts a little bit Pain Location: RLE Pain Intervention(s): Monitored during session, Repositioned    Home Living                          Prior Function            PT Goals (current goals can now be found in the care plan section) Acute Rehab PT Goals Patient Stated Goal: return home, open to CIR admission PT Goal Formulation: With patient Time For Goal Achievement:  07/03/23 Progress towards PT goals: Progressing toward goals    Frequency    Min 1X/week      PT Plan      Co-evaluation              AM-PAC PT "6 Clicks" Mobility   Outcome Measure  Help needed turning from your back to your side while in a flat bed without using bedrails?: A Little Help needed moving from lying on your back to sitting on the side of a flat bed without using bedrails?: A Little Help needed moving to and from a bed to a chair (including a wheelchair)?: A Little Help needed standing up from a chair using your arms (e.g., wheelchair or bedside chair)?: Total Help needed to walk in hospital room?: Total Help needed climbing 3-5 steps with a railing? : Total 6 Click Score: 12    End of Session   Activity Tolerance: Patient tolerated treatment well;Patient limited by fatigue Patient left: in chair;with call bell/phone within reach;with family/visitor present Nurse Communication: Mobility status PT Visit Diagnosis: Muscle weakness (generalized) (M62.81)     Time: 8657-8469 PT Time Calculation (min) (ACUTE ONLY): 20 min  Charges:    $Therapeutic Activity: 8-22 mins PT General Charges $$ ACUTE PT VISIT: 1 Visit                     Quinisha Mould, PT, GCS 06/21/23,10:35 AM

## 2023-06-21 NOTE — Progress Notes (Signed)
Inpatient Rehab Admissions Coordinator:  Await medical clearance. Will continue to follow.   Wolfgang Phoenix, MS, CCC-SLP Admissions Coordinator [phone]

## 2023-06-21 NOTE — Consult Note (Signed)
Physical Medicine and Rehabilitation Consult Reason for Consult:right BKA Referring Physician: Rhona Leavens   HPI: [name]. is a 72 y.o. male with a hx of renal and pancreas transplants at [facility] as well as DM, prior left BKA who developed a worsening wound in his right foot who was admitted on 06/06/23 with sepsis d/t a right foot wound infection. Plans were for a right 1st ray amputation and then the patient developed headaches, dizziness and dysphagia. 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 Pt was seen by neurology who felt the stroke was due to small vessel disease/numerous risk factors. Plan was DAPT for 3 weeks followed by aspirin alone. The findings were felt to be incidental on MRI. After salvage attempts the pt opted for a right BKA which was performed on 9/20 by Dr. Lajoyce Corners. Pt was seen by therapies today and was min assist to scoot edge of bed. With increased time and positioning he was able to scoot from bed to recliner with supervision/ min assist. His left BKA prosthesis does not work as I believe a sock was shoved in between the socket and silicone liner. The liner will no longer sit down in the socket nor can it be removed.    Review of Systems  Constitutional:  Positive for fever.  Eyes:        Blindness  Respiratory:  Negative for cough.   Cardiovascular:  Negative for chest pain.  Gastrointestinal:  Negative for heartburn.  Musculoskeletal:  Positive for joint pain and myalgias.  Skin:  Negative for rash.  Neurological:  Positive for dizziness and focal weakness.  Psychiatric/Behavioral:  Negative for depression.    Past Medical History:  Diagnosis Date   Anxiety    COVID-19 virus infection 09/2020   CPAP (continuous positive airway pressure) dependence    Diabetes mellitus without complication (HCC)    GERD (gastroesophageal reflux disease)    Hypertension    Legally blind in left  eye, as defined in [place]    PTSD (post-traumatic stress disorder)    PVD (peripheral vascular disease) (HCC) 01/17/2015   Renal disorder    Sleep apnea    Thyroid disease    Past Surgical History:  Procedure Laterality Date   ABDOMINAL AORTAGRAM N/A 01/21/2015   Procedure: ABDOMINAL Ronny Flurry;  Surgeon: Chuck Hint, MD;  Location: [facility] CATH LAB;  Service: Cardiovascular;  Laterality: N/A;   ABDOMINAL AORTOGRAM W/LOWER EXTREMITY Right 06/15/2023   Procedure: ABDOMINAL AORTOGRAM W/LOWER EXTREMITY;  Surgeon: Nada Libman, MD;  Location: [facility] INVASIVE CV LAB;  Service: Cardiovascular;  Laterality: Right;   AMPUTATION Left 01/23/2015   Procedure: FIRST RAY AMPUTATION/LEFT FOOT;  Surgeon: Nadara Mustard, MD;  Location: [facility] OR;  Service: Orthopedics;  Laterality: Left;   AMPUTATION Left 02/20/2015   Procedure: AMPUTATION BELOW KNEE;  Surgeon: Nadara Mustard, MD;  Location: [facility] OR;  Service: Orthopedics;  Laterality: Left;   AMPUTATION Right 06/18/2023   Procedure: RIGHT BELOW KNEE AMPUTATION;  Surgeon: Nadara Mustard, MD;  Location: [facility] OR;  Service: Orthopedics;  Laterality: Right;   AV FISTULA PLACEMENT     BALLOON DILATION N/A 02/26/2021   Procedure: BALLOON DILATION;  Surgeon: Hilarie Fredrickson, MD;  Location: [facility] ENDOSCOPY;  Service: Endoscopy;  Laterality: N/A;   BIOPSY  02/26/2021   Procedure: BIOPSY;  Surgeon: Hilarie Fredrickson, MD;  Location: [facility] ENDOSCOPY;  Service: Endoscopy;;   COLONOSCOPY  COMBINED KIDNEY-PANCREAS TRANSPLANT Left 2009   ESOPHAGOGASTRODUODENOSCOPY (EGD) WITH PROPOFOL N/A 02/26/2021   Procedure: ESOPHAGOGASTRODUODENOSCOPY (EGD) WITH PROPOFOL;  Surgeon: Hilarie Fredrickson, MD;  Location: [facility] ENDOSCOPY;  Service: Endoscopy;  Laterality: N/A;   INCISION AND DRAINAGE ABSCESS Left 01/13/2015   Procedure: INCISION AND DRAINAGE OF LEFT FOOT FIRST RAY ABCESS;  Surgeon: Kathryne Hitch, MD;  Location: [facility] OR;  Service: Orthopedics;  Laterality: Left;   LOWER EXTREMITY ANGIOGRAM Left  01/21/2015   Procedure: LOWER EXTREMITY ANGIOGRAM;  Surgeon: Chuck Hint, MD;  Location: [facility] CATH LAB;  Service: Cardiovascular;  Laterality: Left;   PERIPHERAL VASCULAR BALLOON ANGIOPLASTY Right 06/15/2023   Procedure: PERIPHERAL VASCULAR BALLOON ANGIOPLASTY;  Surgeon: Nada Libman, MD;  Location: [facility] INVASIVE CV LAB;  Service: Cardiovascular;  Laterality: Right;  right AT   Family History  Problem Relation Age of Onset   Hypertension Other    Social History:  reports that he has quit smoking. He has never used smokeless tobacco. He reports current alcohol use. He reports current drug use. Drug: Marijuana. Allergies: No Known Allergies Medications Prior to Admission  Medication Sig Dispense Refill   amLODipine (NORVASC) 10 MG tablet Take 10 mg by mouth daily.     ascorbic acid (VITAMIN C) 250 MG tablet Take 250 mg by mouth once a week. Take 1 tablet by mouth on Monday, Wednesday, and Friday for iron absorption.     aspirin EC 81 MG tablet Take 81 mg by mouth daily.     atorvastatin (LIPITOR) 80 MG tablet Take 40 mg by mouth at bedtime.      cyclobenzaprine (FLEXERIL) 5 MG tablet Take 1 tablet (5 mg total) by mouth 2 (two) times daily as needed for muscle spasms. 10 tablet 0   doxycycline (VIBRA-TABS) 100 MG tablet Take 1 tablet (100 mg total) by mouth 2 (two) times daily. 28 tablet 0   ergocalciferol (VITAMIN D2) 1.25 MG (50000 UT) capsule Take 50,000 Units by mouth every Wednesday.     ferrous sulfate 325 (65 FE) MG tablet Take 325 mg by mouth once a week. Take 1 tablet by mouth on Monday, Wednesday, and Friday     furosemide (LASIX) 20 MG tablet Take 1 tablet (20 mg total) by mouth daily. 30 tablet 2   insulin glargine (LANTUS SOLOSTAR) 100 UNIT/ML Solostar Pen Inject 28 Units into the skin daily. 30 mL 3   labetalol (NORMODYNE) 100 MG tablet Take 2 tablets (200 mg total) by mouth 2 (two) times daily. 60 tablet 0   lisinopril (PRINIVIL,ZESTRIL) 10 MG tablet Take 1 tablet (10 mg  total) by mouth daily. 30 tablet 0   losartan (COZAAR) 50 MG tablet Take 50 mg by mouth daily.     metFORMIN (GLUCOPHAGE-XR) 500 MG 24 hr tablet Take 1 tablet (500 mg total) by mouth 2 (two) times daily. 180 tablet 3   mycophenolate (CELLCEPT) 250 MG capsule Take 1,000 mg by mouth 2 (two) times daily.     pantoprazole (PROTONIX) 40 MG tablet Take 1 tablet (40 mg total) by mouth daily. 30 tablet 3   potassium chloride 20 MEQ TBCR Take 20 mEq by mouth daily. 7 tablet 0   tacrolimus (PROGRAF) 5 MG capsule Take 5 mg by mouth 2 (two) times daily.     traZODone (DESYREL) 50 MG tablet Take 50 mg by mouth at bedtime as needed for sleep.     sildenafil (VIAGRA) 50 MG tablet Take 50 mg by mouth as needed.  Home: Home Living Family/patient expects to be discharged to:: Private residence Living Arrangements: Spouse/significant other, Other relatives (mother-in-law) Available Help at Discharge: Family, Available 24 hours/day Type of Home: Apartment Home Access: Level entry Entrance Stairs-Number of Steps: 1 Home Layout: One level Bathroom Shower/Tub: Engineer, manufacturing systems: Standard Bathroom Accessibility: Yes Home Equipment: Cane - single point, Shower seat, Grab bars - toilet, Grab bars - tub/shower, BSC/3in1, Firefighter  Lives With: Spouse, Family  Functional History: Prior Function Prior Level of Function : Independent/Modified Independent Mobility Comments: At baseline, pt wears prosthesis every day, ambulates with SPC. Since admitted, pt has been light assist for transfers ADLs Comments: At baseline pt is Independent with ADLs. Functional Status:  Mobility: Bed Mobility Overal bed mobility: Needs Assistance Bed Mobility: Supine to Sit Supine to sit: Min assist, HOB elevated, Used rails Sit to supine: Mod assist, +2 for physical assistance General bed mobility comments: Pt presented in chair at this time and recently was got up with PT Transfers Overall transfer  level: Needs assistance Equipment used: None Transfers: Bed to chair/wheelchair/BSC Sit to Stand: Supervision, Contact guard assist Bed to/from chair/wheelchair/BSC transfer type:: Lateral/scoot transfer Step pivot transfers: Contact guard assist  Lateral/Scoot Transfers: Contact guard assist, Supervision General transfer comment: patient generally supervision for lateral scoot transfer to recliner. Increased time needed to complete and cues for safety. Ambulation/Gait General Gait Details: deferred for safety due to lack of +2 for chair follow and slight dizziness Pre-gait activities: marching    ADL: ADL Overall ADL's : Needs assistance/impaired Eating/Feeding: Sitting, Set up Eating/Feeding Details (indicate cue type and reason): due to vision and depending on how many things are on the tray and lighting depdning on how much set up is required Grooming: Set up, Sitting Upper Body Bathing: Minimal assistance, Sitting Upper Body Bathing Details (indicate cue type and reason): simulated Lower Body Bathing: Moderate assistance, Sitting/lateral leans (long sitting in chaor) Lower Body Bathing Details (indicate cue type and reason): simulated Upper Body Dressing : Minimal assistance, Sitting Lower Body Dressing: Moderate assistance, Sitting/lateral leans Lower Body Dressing Details (indicate cue type and reason): long sitting Toilet Transfer: Minimal assistance, +2 for safety/equipment, BSC/3in1, Requires drop arm (lateral-scoot) Toilet Transfer Details (indicate cue type and reason): simulated task Toileting- Clothing Manipulation and Hygiene: Moderate assistance, Sitting/lateral lean Toileting - Clothing Manipulation Details (indicate cue type and reason): with increased time Functional mobility during ADLs: Minimal assistance, +2 for safety/equipment, Rolling walker (2 wheels)  Cognition: Cognition Overall Cognitive Status: Within Functional Limits for tasks  assessed Arousal/Alertness: Awake/alert Orientation Level: Oriented X4 Attention: Sustained Sustained Attention: Appears intact Memory: Impaired Memory Impairment: Retrieval deficit Awareness: Appears intact Problem Solving: Impaired Problem Solving Impairment: Verbal basic Cognition Arousal: Alert Behavior During Therapy: WFL for tasks assessed/performed Overall Cognitive Status: Within Functional Limits for tasks assessed General Comments: AAOx4 and able to follow 1-step commands consistantly. Emergent awareness and Sustained attention. Pt can be tangential in conversation and requires occasional cues to maintain attention to task. Decreased safety awareness and decreased insight into deficits.  Blood pressure 138/89, pulse 92, temperature 97.7 F (36.5 C), temperature source Oral, resp. rate 16, height 5\' 10"  (1.778 m), weight 71.1 kg, SpO2 99%. Physical Exam Constitutional:      Appearance: He is normal weight.  HENT:     Head: Normocephalic.     Right Ear: External ear normal.     Left Ear: External ear normal.     Mouth/Throat:     Pharynx: Oropharynx is clear.  Eyes:     Comments: Minimal vision thru left eye  Cardiovascular:     Rate and Rhythm: Normal rate and regular rhythm.     Heart sounds: No murmur heard.    No gallop.  Pulmonary:     Effort: Pulmonary effort is normal. No respiratory distress.     Breath sounds: No wheezing.  Abdominal:     General: There is no distension.     Palpations: Abdomen is soft.     Tenderness: There is no abdominal tenderness.  Musculoskeletal:     Cervical back: Normal range of motion.     Comments: Right BKA with black shrinker/vac. Residual swelling. Left BKA appropriately shaped.   Neurological:     Mental Status: He is alert.     Comments: Alert and oriented x 3. Normal insight and awareness. Intact Memory. Normal language and speech. Cranial nerve exam unremarkable for poor vision OS. MMT: grossly 4/5 prox to distal. RLE  2/5 HF. LE5/5 HF, KE. No focal sensory findings appreciated. Marland Kitchen       Results for orders placed or performed during the hospital encounter of 06/06/23 (from the past 24 hour(s))  Glucose, capillary     Status: Abnormal   Collection Time: 06/20/23  4:43 PM  Result Value Ref Range   Glucose-Capillary 128 (H) 70 - 99 mg/dL  Glucose, capillary     Status: Abnormal   Collection Time: 06/20/23  9:14 PM  Result Value Ref Range   Glucose-Capillary 160 (H) 70 - 99 mg/dL  Glucose, capillary     Status: None   Collection Time: 06/21/23  5:41 AM  Result Value Ref Range   Glucose-Capillary 87 70 - 99 mg/dL  Comprehensive metabolic panel     Status: Abnormal   Collection Time: 06/21/23 10:08 AM  Result Value Ref Range   Sodium 137 135 - 145 mmol/L   Potassium 4.5 3.5 - 5.1 mmol/L   Chloride 105 98 - 111 mmol/L   CO2 17 (L) 22 - 32 mmol/L   Glucose, Bld 118 (H) 70 - 99 mg/dL   BUN 28 (H) 8 - 23 mg/dL   Creatinine, Ser 6.21 (H) 0.61 - 1.24 mg/dL   Calcium 9.9 8.9 - 30.8 mg/dL   Total Protein 7.0 6.5 - 8.1 g/dL   Albumin 3.1 (L) 3.5 - 5.0 g/dL   AST 38 15 - 41 U/L   ALT 29 0 - 44 U/L   Alkaline Phosphatase 91 38 - 126 U/L   Total Bilirubin 1.1 0.3 - 1.2 mg/dL   GFR, Estimated 58 (L) >60 mL/min   Anion gap 15 5 - 15  CBC     Status: Abnormal   Collection Time: 06/21/23 10:08 AM  Result Value Ref Range   WBC 16.9 (H) 4.0 - 10.5 K/uL   RBC 3.74 (L) 4.22 - 5.81 MIL/uL   Hemoglobin 9.4 (L) 13.0 - 17.0 g/dL   HCT 65.7 (L) 84.6 - 96.2 %   MCV 81.3 80.0 - 100.0 fL   MCH 25.1 (L) 26.0 - 34.0 pg   MCHC 30.9 30.0 - 36.0 g/dL   RDW 95.2 84.1 - 32.4 %   Platelets 266 150 - 400 K/uL   nRBC 0.0 0.0 - 0.2 %  Glucose, capillary     Status: Abnormal   Collection Time: 06/21/23 12:38 PM  Result Value Ref Range   Glucose-Capillary 183 (H) 70 - 99 mg/dL   Comment 1 Notify RN    Comment 2 Document in  Chart    DG Abd 1 View  Result Date: 06/20/2023 CLINICAL DATA:  [id number] with nausea and vomiting,  and weakness. EXAM: ABDOMEN - 1 VIEW COMPARISON:  Last abdomen and pelvis CT was 02/22/2021, last abdomen series was 01/25/2020. FINDINGS: The bowel gas pattern is nonobstructive, with moderately dense stool with contrast, mild-to-moderate amount in the large bowel with aeration through into the rectum. There is no supine evidence of free air. Postsurgical changes are present right mid abdomen. Heavy iliofemoral calcific arteriosclerosis. No radio-opaque calculi or other significant radiographic abnormality are seen. Lung bases are clear. IMPRESSION: 1. Nonobstructive bowel gas pattern. 2. Moderate dense stool burden with contrast. 3. Heavy iliofemoral calcific arteriosclerosis. Electronically Signed   By: Almira Bar M.D.   On: 06/20/2023 22:22    Assessment/Plan: Diagnosis: 89 male with previous left BKA with osteomyelitis/infection right foot which ultimately required right BKA. Course complicated by small stroke Does the need for close, 24 hr/day medical supervision in concert with the patient's rehab needs make it unreasonable for this patient to be served in a less intensive setting? Yes Co-Morbidities requiring supervision/potential complications:  -skin care/wound/ID -prosthetist assessment for left BKA socket. It needs to manipulated so that liner can be removed from socket. -pain mgt -renal and pancreas transplant patient Due to bladder management, bowel management, safety, skin/wound care, disease management, medication administration, pain management, and patient education, does the patient require 24 hr/day rehab nursing? Yes Does the patient require coordinated care of a physician, rehab nurse, therapy disciplines of PT, OT to address physical and functional deficits in the context of the above medical diagnosis(es)? Yes Addressing deficits in the following areas: balance, endurance, locomotion, strength, transferring, bowel/bladder control, bathing, dressing, feeding, grooming,  toileting, and psychosocial support Can the patient actively participate in an intensive therapy program of at least 3 hrs of therapy per day at least 5 days per week? Yes The potential for patient to make measurable gains while on inpatient rehab is excellent Anticipated functional outcomes upon discharge from inpatient rehab are modified independent  with PT, modified independent with OT, n/a with SLP at wheelchair level. Estimated rehab length of stay to reach the above functional goals is: 7-10 days Anticipated discharge destination: Home Overall Rehab/Functional Prognosis: good  POST ACUTE RECOMMENDATIONS: This patient's condition is appropriate for continued rehabilitative care in the following setting: CIR Patient has agreed to participate in recommended program. Yes Note that insurance prior authorization may be required for reimbursement for recommended care.  Comment: Rehab Admissions Coordinator to follow up.      I have personally performed a face to face diagnostic evaluation of this patient. Additionally, I have examined the patient's medical record including any pertinent labs and radiographic images. If the physician assistant has documented in this note, I have reviewed and edited or otherwise concur with the physician assistant's documentation.  Thanks,  Ranelle Oyster, MD 06/21/2023

## 2023-06-21 NOTE — Progress Notes (Signed)
Occupational Therapy Treatment Patient Details Name: [name]. MRN: [id number] DOB: 1950-11-20 Today's Date: 06/21/2023   History of present illness Pt is 72 yo male who presents on 06/06/23 with generalized weakness and fatigue with tremors and chills. Has a wound under R great toe and plans for 1st ray amputation. 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 PMH: kidney transplant, DM, GERD, HTN, PTSD, PVD, L eye blindness, sleep apnea, thyroid disease, L BKA   OT comments  Pt was very happy to work with therapies today. Pt agreed to complete UE/LE hygiene and dressing tasks with supervision to min assist as noted with decrease at times with depth perception of items to retrieve. Pt was educated about with positioning of items and contrast to increase in ability to complete tasks.       If plan is discharge home, recommend the following:  Two people to help with walking and/or transfers;A lot of help with bathing/dressing/bathroom;Assistance with cooking/housework;Assist for transportation;Help with stairs or ramp for entrance   Equipment Recommendations   (TBD at next level)    Recommendations for Other Services Rehab consult    Precautions / Restrictions Precautions Precautions: Fall Precaution Comments: watch HR, vision impairment Other Brace: L prosthesis (unable to don this date, release button locked up) Restrictions Weight Bearing Restrictions: Yes RLE Weight Bearing: Non weight bearing Other Position/Activity Restrictions: wound vac       Mobility Bed Mobility               General bed mobility comments: Pt presented in chair at this time and recently was got up with PT    Transfers                         Balance Overall balance assessment: Needs assistance Sitting-balance support: No upper extremity supported Sitting  balance-Leahy Scale: Good Sitting balance - Comments: Pt able to complete bathing for UE and peri care while in a long sitting in chair                                   ADL either performed or assessed with clinical judgement   ADL Overall ADL's : Needs assistance/impaired Eating/Feeding: Sitting;Set up Eating/Feeding Details (indicate cue type and reason): due to vision and depending on how many things are on the tray and lighting depdning on how much set up is required Grooming: Set up;Sitting   Upper Body Bathing: Minimal assistance;Sitting   Lower Body Bathing: Moderate assistance;Sitting/lateral leans (long sitting in chaor)   Upper Body Dressing : Minimal assistance;Sitting   Lower Body Dressing: Moderate assistance;Sitting/lateral leans Lower Body Dressing Details (indicate cue type and reason): long sitting                    Extremity/Trunk Assessment Upper Extremity Assessment Upper Extremity Assessment: Generalized weakness            Vision   Vision Assessment?: Vision impaired- to be further tested in functional context Additional Comments: noted to have decrease in perpherial and can see about 4-6 in from midline and depth perception   Perception Perception Perception: Not tested   Praxis Praxis Praxis: Not tested    Cognition Arousal: Alert Behavior During Therapy: [facility] for tasks assessed/performed Overall Cognitive Status:  Within Functional Limits for tasks assessed                                          Exercises      Shoulder Instructions       General Comments      Pertinent Vitals/ Pain       Pain Assessment Pain Assessment: Faces Faces Pain Scale: Hurts a little bit Pain Location: RLE Pain Descriptors / Indicators: Grimacing, Guarding, Discomfort Pain Intervention(s): Limited activity within patient's tolerance, Monitored during session, Repositioned  Home Living                                           Prior Functioning/Environment              Frequency  Min 2X/week        Progress Toward Goals  OT Goals(current goals can now be found in the care plan section)  Progress towards OT goals: Progressing toward goals  Acute Rehab OT Goals Patient Stated Goal: to keep doing for self OT Goal Formulation: With patient Time For Goal Achievement: 07/03/23 Potential to Achieve Goals: Good ADL Goals Pt Will Perform Grooming: with set-up;sitting Pt Will Perform Upper Body Bathing: with set-up;sitting Pt Will Perform Lower Body Bathing: with contact guard assist;with supervision Pt Will Perform Upper Body Dressing: with set-up;sitting Pt Will Perform Lower Body Dressing: with contact guard assist;with supervision Pt Will Transfer to Toilet: with contact guard assist;squat pivot transfer;bedside commode Pt Will Perform Toileting - Clothing Manipulation and hygiene: with contact guard assist;sitting/lateral leans  Plan      Co-evaluation                 AM-PAC OT "6 Clicks" Daily Activity     Outcome Measure   Help from another person eating meals?: A Little Help from another person taking care of personal grooming?: A Little Help from another person toileting, which includes using toliet, bedpan, or urinal?: A Lot Help from another person bathing (including washing, rinsing, drying)?: A Lot Help from another person to put on and taking off regular upper body clothing?: A Little Help from another person to put on and taking off regular lower body clothing?: A Lot 6 Click Score: 15    End of Session Equipment Utilized During Treatment: Gait belt  OT Visit Diagnosis: Other abnormalities of gait and mobility (R26.89);Muscle weakness (generalized) (M62.81)   Activity Tolerance Patient tolerated treatment well   Patient Left in bed;with call bell/phone within reach;with family/visitor present   Nurse Communication Mobility status         Time: 1000-1043 OT Time Calculation (min): 43 min  Charges: OT General Charges $OT Visit: 1 Visit OT Treatments $Self Care/Home Management : 38-52 mins  Presley Raddle OTR/L  Acute Rehab Services  [phone] office number   Alphia Moh 06/21/2023, 11:38 AM

## 2023-06-22 DIAGNOSIS — M869 Osteomyelitis, unspecified: Secondary | ICD-10-CM | POA: Diagnosis not present

## 2023-06-22 DIAGNOSIS — Z8719 Personal history of other diseases of the digestive system: Secondary | ICD-10-CM | POA: Diagnosis not present

## 2023-06-22 DIAGNOSIS — R1310 Dysphagia, unspecified: Secondary | ICD-10-CM | POA: Diagnosis not present

## 2023-06-22 DIAGNOSIS — R6883 Chills (without fever): Secondary | ICD-10-CM | POA: Diagnosis not present

## 2023-06-22 DIAGNOSIS — R933 Abnormal findings on diagnostic imaging of other parts of digestive tract: Secondary | ICD-10-CM | POA: Diagnosis not present

## 2023-06-22 DIAGNOSIS — I639 Cerebral infarction, unspecified: Secondary | ICD-10-CM | POA: Diagnosis not present

## 2023-06-22 LAB — COMPREHENSIVE METABOLIC PANEL
ALT: 25 U/L (ref 0–44)
AST: 27 U/L (ref 15–41)
Albumin: 2.5 g/dL — ABNORMAL LOW (ref 3.5–5.0)
Alkaline Phosphatase: 74 U/L (ref 38–126)
Anion gap: 8 (ref 5–15)
BUN: 27 mg/dL — ABNORMAL HIGH (ref 8–23)
CO2: 22 mmol/L (ref 22–32)
Calcium: 9.5 mg/dL (ref 8.9–10.3)
Chloride: 106 mmol/L (ref 98–111)
Creatinine, Ser: 1.14 mg/dL (ref 0.61–1.24)
GFR, Estimated: >60 mL/min (ref >60)
Glucose, Bld: 94 mg/dL (ref 70–99)
Potassium: 4.2 mmol/L (ref 3.5–5.1)
Sodium: 136 mmol/L (ref 135–145)
Total Bilirubin: 0.6 mg/dL (ref 0.3–1.2)
Total Protein: 5.9 g/dL — ABNORMAL LOW (ref 6.5–8.1)

## 2023-06-22 LAB — GLUCOSE, CAPILLARY
Glucose-Capillary: 84 mg/dL (ref 70–99)
Glucose-Capillary: 175 mg/dL — ABNORMAL HIGH (ref 70–99)
Glucose-Capillary: 247 mg/dL — ABNORMAL HIGH (ref 70–99)

## 2023-06-22 LAB — CBC
HCT: 25.9 % — ABNORMAL LOW (ref 39.0–52.0)
Hemoglobin: 8.2 g/dL — ABNORMAL LOW (ref 13.0–17.0)
MCH: 25.2 pg — ABNORMAL LOW (ref 26.0–34.0)
MCHC: 31.7 g/dL (ref 30.0–36.0)
MCV: 79.7 fL — ABNORMAL LOW (ref 80.0–100.0)
Platelets: 228 10*3/uL (ref 150–400)
RBC: 3.25 MIL/uL — ABNORMAL LOW (ref 4.22–5.81)
RDW: 14.6 % (ref 11.5–15.5)
WBC: 13.3 10*3/uL — ABNORMAL HIGH (ref 4.0–10.5)
nRBC: 0 % (ref 0.0–0.2)

## 2023-06-22 MED ORDER — BISACODYL 5 MG PO TBEC
10.0000 mg | DELAYED_RELEASE_TABLET | Freq: Once | ORAL | Status: AC
  Administered 2023-06-22: 10 mg via ORAL
  Filled 2023-06-22: qty 2

## 2023-06-22 MED ORDER — ENSURE ENLIVE PO LIQD
237.0000 mL | Freq: Two times a day (BID) | ORAL | Status: DC
  Administered 2023-06-22 – 2023-06-28 (×5): 237 mL via ORAL

## 2023-06-22 NOTE — Progress Notes (Signed)
Daily Progress Note  DOA: 06/06/2023 Hospital Day: 17  Chief Complaint: dysphagia, ? vomiting   ASSESSMENT    Brief Narrative:  [name]. is a 72 y.o. year old male with a history of    renal and pancreatic transplant at [facility],  DM, hypertension, left leg BKA, obstructive sleep apnea, and a chronic right foot wound. Hospitalized 9/8 with sepsis 2/2 R foot osteomyelitis. GI evaluated 9/12 for N/V. / chronic dysphagia and abnormal barium swallow . Plan was for EGD but procedure cancelled by Anesthesia due to chills, tachycardia and elevated BP. MRI brain showed acute CVA and GI signed off. Called back to see again today for heaving and dysphagia   Chronic dysphagia / ? Vomiting Abnormal barium swallow with severe dysmotility and non passage of tablet. History of peptic stricture s/p esophageal dilation in 2022.  Patient doesn't not think that dysphagia has changed since acute stroke. Endorses dysphagia to both solids and liquids and ? Vomiting. He is sleepy, not talkative but says he tolerated liquid breakfast today.   Constipation. Recent KUB showed significant stool burden.  Getting Miralax BID and got a Dulcolax supp yesterday but says he hasn't had a BM in 3 days  Acute CVA, on plavix  PLAN   --Increase bowel regimen. Continue BID MIralax. Add one time dose of Dulcolax 10 mg PO.  --Will try to advance to full liquids --Continue IV Reglan for now --Still trying to sort out whether he is having true N/V or all related to dysphagia and also if constipation could be contributing to N /V    Subjective   No BM in 3 days. Says he tolerated broth and juice for breakfast without vomiting or swallowing issues.    Objective   barium swallow  showing tertiary contractions and poor progression of contrast leading to severe dysmotility and incomplete esophageal emptying and inability to pass a 13 mm tablet .  Recent Labs    06/20/23 0318 06/21/23 1008  06/22/23 0306  WBC 14.8* 16.9* 13.3*  HGB 9.5* 9.4* 8.2*  HCT 30.1* 30.4* 25.9*  PLT 220 266 228   BMET Recent Labs    06/20/23 0318 06/21/23 1008 06/22/23 0306  NA 138 137 136  K 4.5 4.5 4.2  CL 103 105 106  CO2 22 17* 22  GLUCOSE 74 118* 94  BUN 18 28* 27*  CREATININE 1.35* 1.32* 1.14  CALCIUM 9.7 9.9 9.5   LFT Recent Labs    06/22/23 0306  PROT 5.9*  ALBUMIN 2.5*  AST 27  ALT 25  ALKPHOS 74  BILITOT 0.6   PT/INR No results for input(s): "LABPROT", "INR" in the last 72 hours.   Imaging:  DG Abd 1 View CLINICAL DATA:  [id number] with nausea and vomiting, and weakness.  EXAM: ABDOMEN - 1 VIEW  COMPARISON:  Last abdomen and pelvis CT was 02/22/2021, last abdomen series was 01/25/2020.  FINDINGS: The bowel gas pattern is nonobstructive, with moderately dense stool with contrast, mild-to-moderate amount in the large bowel with aeration through into the rectum.  There is no supine evidence of free air. Postsurgical changes are present right mid abdomen. Heavy iliofemoral calcific arteriosclerosis.  No radio-opaque calculi or other significant radiographic abnormality are seen. Lung bases are clear.  IMPRESSION: 1. Nonobstructive bowel gas pattern. 2. Moderate dense stool burden with contrast. 3. Heavy iliofemoral calcific arteriosclerosis.  Electronically Signed   By: Almira Bar M.D.   On: 06/20/2023 22:22  Scheduled inpatient medications:   amLODipine  5 mg Oral Daily   vitamin C  1,000 mg Oral Daily   aspirin EC  81 mg Oral Daily   atorvastatin  80 mg Oral QHS   clopidogrel  75 mg Oral Q breakfast   docusate sodium  100 mg Oral BID   enoxaparin (LOVENOX) injection  40 mg Subcutaneous Q24H   furosemide  20 mg Oral Daily   insulin aspart  0-6 Units Subcutaneous TID WC   insulin glargine-yfgn  7 Units Subcutaneous Daily   leptospermum manuka honey  1 Application Topical Daily   losartan  50 mg Oral Daily   metoCLOPramide (REGLAN)  injection  5 mg Intravenous Q8H   mycophenolate  1,000 mg Oral BID   nutrition supplement (JUVEN)  1 packet Oral BID BM   pantoprazole (PROTONIX) IV  40 mg Intravenous Q12H   polyethylene glycol  17 g Oral BID   potassium chloride SA  20 mEq Oral Daily   sodium chloride flush  3 mL Intravenous Q12H   tacrolimus  5 mg Oral BID   Vitamin D (Ergocalciferol)  50,000 Units Oral Q Wed   zinc sulfate  220 mg Oral Daily   Continuous inpatient infusions:   sodium chloride     lactated ringers Stopped (06/21/23 2053)   magnesium sulfate bolus IVPB     PRN inpatient medications: sodium chloride, acetaminophen **OR** acetaminophen, alum & mag hydroxide-simeth, bisacodyl, guaiFENesin-dextromethorphan, hydrALAZINE, HYDROmorphone (DILAUDID) injection, ipratropium-albuterol, labetalol, magnesium citrate, magnesium sulfate bolus IVPB, melatonin, metoprolol tartrate, ondansetron, oxyCODONE, oxyCODONE, phenol, potassium chloride, sodium chloride flush, traZODone  Vital signs in last 24 hours: Temp:  [97.8 F (36.6 C)-98.7 F (37.1 C)] 98.1 F (36.7 C) (09/24 1230) Pulse Rate:  [73-89] 78 (09/24 1230) Resp:  [17-20] 20 (09/24 1230) BP: (108-167)/(50-72) 119/62 (09/24 1230) SpO2:  [100 %] 100 % (09/24 1230) Last BM Date : 06/18/23  Intake/Output Summary (Last 24 hours) at 06/22/2023 1248 Last data filed at 06/22/2023 0405 Gross per 24 hour  Intake 1466.13 ml  Output 700 ml  Net 766.13 ml    Intake/Output from previous day: 09/23 0701 - 09/24 0700 In: 1706.1 [P.O.:480; I.V.:1226.1] Out: 1100 [Urine:1100] Intake/Output this shift: No intake/output data recorded.   Physical Exam:  General: Sleepy, in NAD Heart:  Regular rate and rhythm.  Pulmonary: Normal respiratory effort Abdomen: Soft, nondistended, nontender. Normal bowel sounds. Psych:  Cooperative. Insight appears normal.    Principal Problem:   Osteomyelitis of great toe of right foot (HCC) Active Problems:   PAD (peripheral  artery disease) (HCC)   S/P BKA (below knee amputation) unilateral (HCC)   OSA on CPAP   History of simultaneous kidney and pancreas transplant (HCC)   Dysphagia   Abnormal barium swallow   History of esophageal stricture     LOS: 16 days   Willette Cluster ,NP 06/22/2023, 12:48 PM

## 2023-06-22 NOTE — Progress Notes (Signed)
Progress Note   Patient: [name]. VWU:[id number] DOB: [birth year] DOA: 06/06/2023     16 DOS: the patient was seen and examined on 06/22/2023   Brief hospital course: Mr. Escareno is a 72 y.o. M with hx renal/pancreas transplant, HTN, DM, L BKA, and chronic right foot wound who presented with chills.    MRI of the foot was consistent with osteomyelitis.  Ortho consulted.  Vascular surgery consulted, performed angiogram that showed poor runoff to the right foot, BKA recommended.    Hospitalization complicated by acute right blurry vision, found to be due to stroke.      Assessment and Plan: Sepsis due to acute on chronic right foot ulcer with osteomyelitis at the metatarsophalangeal joint Peripheral vascular disease -Completed course of cefepime, vancomycin -Orthopedic Surgery and Vascular had been following - Now s/p R BKA 9/20 - Therapy recs for acute inpatient rehab, pending   Diabetes - Continue semglee 7 units daily - Continue sliding scale as needed - glycemic trends stable   Pancreas and kidney transplant -Continue mycophenolate and tacrolimus   Acute stroke -Acute CVA diagnosed this visit --Pt had been followed by Neurology, who later signed off 9/15 -On 9/15, Neuro recommended DAPT x 3 weeks with ASA and plavix, followed by ASA alone afterwards   Hypertension -Blood pressure controlled - Continue amlodipine, Lasix, losartan   Hyperlipidemia -Continue Lipitor  Dysphagia -Earlier noted to have evidence of severe dysmotility on esophogram. GI was consulted on 9/12 and planned EGD , however the pt had a stroke on 9/14 and so GI work up was no longer pursued -Following pt's CVA, pt was cleared for regular diet by SLP -On 9/22, pt was noted to have recurrent nausea and dry heaving. Family reports issues with tolerating oral medications as well -Re-consulted GI. Recommendation to wean opioids,and to more aggressively treat constipation. May consider trial of  fluconazole in the future. Ultimately, may consider EGD in the future when pt is more safely able to get EGD -Advancing diet per GI today   Subjective: Still no BM this AM. Tolerating liquid diet this AM  Physical Exam: Vitals:   06/22/23 0817 06/22/23 1230 06/22/23 1311 06/22/23 1601  BP: 108/72 119/62 119/62 130/62  Pulse: 83 78 81 100  Resp: 20 20 (!) 25 20  Temp: 98.7 F (37.1 C) 98.1 F (36.7 C) 98.6 F (37 C) 98.1 F (36.7 C)  TempSrc: Oral Oral Oral Oral  SpO2: 100% 100%  100%  Weight:      Height:       General exam: Conversant, in no acute distress Respiratory system: normal chest rise, clear, no audible wheezing Cardiovascular system: regular rhythm, s1-s2 Gastrointestinal system: Nondistended, nontender, pos BS Central nervous system: No seizures, no tremors Extremities: No cyanosis, no joint deformities Skin: No rashes, no pallor Psychiatry: Affect normal // no auditory hallucinations   Data Reviewed:  Labs reviewed: Na 136, K 4.2, Cr 1.14, WBC 13.3, Hgb 8.2, Plts 228   Family Communication: Pt in room, family at bedside  Disposition: Status is: Inpatient Remains inpatient appropriate because: severity of illness  Planned Discharge Destination: Rehab     Author: Rickey [name], MD 06/22/2023 5:51 PM  For on call review [email or web address].

## 2023-06-22 NOTE — Progress Notes (Signed)
Initial Nutrition Assessment  DOCUMENTATION CODES:   Non-severe (moderate) malnutrition in context of chronic illness  INTERVENTION:  Diet advancement per GI Ensure Enlive po BID, each supplement provides 350 kcal and 20 grams of protein. Once diet advanced to regular/solid textures, start calorie count for more objective data on nutritional adequacy Request updated measured weight.  NUTRITION DIAGNOSIS:   Moderate Malnutrition related to chronic illness (uncontrolled DM) as evidenced by mild fat depletion, mild muscle depletion.  GOAL:   Patient will meet greater than or equal to 90% of their needs  MONITOR:   PO intake, Supplement acceptance, Labs, Diet advancement, Weight trends, Skin  REASON FOR ASSESSMENT:   Consult Assessment of nutrition requirement/status  ASSESSMENT:   Pt admitted with chills and weakness found to have osteomyelitis of foot. PMH significant for renal and pancreatic transplant at [facility], DM, HTN, L BKA, chronic R foot wound.  9/20 s/p R BKA  Pt sleeping x2 attempts to visit. Awoke on second visit however remained slightly drowsy. Pt's nutrition history appears inconsistent with medical complications prior to and during admission. Pt noted to have chronic dysphagia with h/o peptic stricture s/p esophageal dilation (2022) and abnormal barium swallow test with severe dysmotility and incomplete esophageal emptying. Per GI, pt with some confusion yesterday and detailed of symptoms unclear.   He reports that PTA he was eating well but could not recall how many meals per day he was consuming. He occasionally drinks protein supplements at home. He states that his swallowing difficulties have only been present during admission however later denies that he is having difficulty swallowing. He reports that he is not much of a breakfast eater but prior to being placed on a clear liquid diet he reports eating about 80% of his regular meals taking small bites at a  time. Unfortunately there is very limited documentation of meal completions on file to review throughout admission.   Spoke with RN who reports that pt has not experienced emesis today but has had to suction food after eating. Observed clear liquid breakfast tray (broth and jello) of which pt consumed 100%. Unable to determine whether pt had difficulty or emesis with this meal.    Pt would benefit from a calorie count once diet able to advance to a more solid textured consistency given limited nature of full liquid diet. Once on a regular diet, will be able to gather more objective data about nutritional adequacy and diet tolerance.   Pt states that he thought he was gaining weight but then says "but apparently not." Unable to elaborate further on this.   There is limited weight history on file to review PTA. Admission weight documented to be 72.6 kg. Morning of R BKA was 71.7 kg. No updated weight on file since that time.   Medications: Vitamin C 1000mg  daily, colace, lasix, SSI 0-6 units TID, semglee 7 units daily, reglan, protonix, miralax, klor-con, Vitamin D 50,000 units every 7 days, zinc   Labs: CBG's 84-187 x24 hours, HgbA1c 11.1%  NUTRITION - FOCUSED PHYSICAL EXAM:  Flowsheet Row Most Recent Value  Orbital Region Severe depletion  Upper Arm Region Moderate depletion  Thoracic and Lumbar Region Mild depletion  Buccal Region No depletion  Temple Region Mild depletion  Clavicle Bone Region Mild depletion  Clavicle and Acromion Bone Region Mild depletion  Scapular Bone Region Moderate depletion  Dorsal Hand Severe depletion  Patellar Region Severe depletion  Anterior Thigh Region Severe depletion  Posterior Calf Region Unable to assess  [B  BKA]  Edema (RD Assessment) None  Hair Reviewed  Eyes Reviewed  Mouth Reviewed  Skin Reviewed  Nails Other (Comment)  [vertical ridges on nails]       Diet Order:   Diet Order             Diet full liquid Room service appropriate?  Yes; Fluid consistency: Thin  Diet effective now                   EDUCATION NEEDS:   Education needs have been addressed  Skin:  Skin Assessment: Skin Integrity Issues: Skin Integrity Issues:: Wound VAC Wound Vac: R BKA  Last BM:  9/20  Height:   Ht Readings from Last 1 Encounters:  06/11/23 5\' 10"  (1.778 m)    Weight:   Wt Readings from Last 1 Encounters:  06/18/23 71.1 kg   BMI:  Body mass index is 22.49 kg/m.  Estimated Nutritional Needs:   Kcal:  1800-2000  Protein:  90-105g  Fluid:  >/=1.8L  Drusilla Kanner, RDN, LDN Clinical Nutrition

## 2023-06-22 NOTE — TOC Progression Note (Signed)
Transition of Care (TOC) - Progression Note    Patient Details  Name: [name]. MRN: [id number] Date of Birth: Jun 18, 1951  Transition of Care [facility]) CM/SW Contact  Inis Sizer, LCSW Phone Number: 06/22/2023, 9:36 AM  Clinical Narrative:    Patient is currently waiting for medical clearance to admit to CIR.  TOC will continue to follow.   Expected Discharge Plan: IP Rehab Facility Barriers to Discharge: Continued Medical Work up  Expected Discharge Plan and Services                                               Social Determinants of Health (SDOH) Interventions SDOH Screenings   Food Insecurity: No Food Insecurity (06/07/2023)  Housing: Medium Risk (06/07/2023)  Transportation Needs: Unmet Transportation Needs (06/07/2023)  Utilities: Not At Risk (06/07/2023)  Depression (PHQ2-9): Low Risk  (02/02/2020)  Social Connections: Unknown (02/06/2022)   Received from [facility]  Tobacco Use: Medium Risk (06/18/2023)    Readmission Risk Interventions     No data to display

## 2023-06-22 NOTE — Addendum Note (Signed)
Addendum  created 06/22/23 1115 by Eilene Ghazi, MD   Child order released for a procedure order, Clinical Note Signed, Intraprocedure Blocks edited, SmartForm saved

## 2023-06-22 NOTE — Progress Notes (Signed)
Inpatient Rehab Admissions Coordinator:  Await medical clearance. Will continue to follow.   Wolfgang Phoenix, MS, CCC-SLP Admissions Coordinator [phone]

## 2023-06-22 NOTE — Anesthesia Procedure Notes (Signed)
Anesthesia Procedure Image    

## 2023-06-23 ENCOUNTER — Inpatient Hospital Stay (HOSPITAL_COMMUNITY): Payer: Medicare HMO

## 2023-06-23 DIAGNOSIS — K5900 Constipation, unspecified: Secondary | ICD-10-CM

## 2023-06-23 DIAGNOSIS — E440 Moderate protein-calorie malnutrition: Secondary | ICD-10-CM | POA: Insufficient documentation

## 2023-06-23 DIAGNOSIS — M869 Osteomyelitis, unspecified: Secondary | ICD-10-CM | POA: Diagnosis not present

## 2023-06-23 DIAGNOSIS — R112 Nausea with vomiting, unspecified: Secondary | ICD-10-CM | POA: Diagnosis not present

## 2023-06-23 DIAGNOSIS — R933 Abnormal findings on diagnostic imaging of other parts of digestive tract: Secondary | ICD-10-CM | POA: Diagnosis not present

## 2023-06-23 DIAGNOSIS — R1310 Dysphagia, unspecified: Secondary | ICD-10-CM | POA: Diagnosis not present

## 2023-06-23 LAB — GLUCOSE, CAPILLARY
Glucose-Capillary: 148 mg/dL — ABNORMAL HIGH (ref 70–99)
Glucose-Capillary: 135 mg/dL — ABNORMAL HIGH (ref 70–99)
Glucose-Capillary: 136 mg/dL — ABNORMAL HIGH (ref 70–99)
Glucose-Capillary: 126 mg/dL — ABNORMAL HIGH (ref 70–99)

## 2023-06-23 LAB — COMPREHENSIVE METABOLIC PANEL
ALT: 39 U/L (ref 0–44)
AST: 32 U/L (ref 15–41)
Albumin: 2.8 g/dL — ABNORMAL LOW (ref 3.5–5.0)
Alkaline Phosphatase: 80 U/L (ref 38–126)
Anion gap: 13 (ref 5–15)
BUN: 30 mg/dL — ABNORMAL HIGH (ref 8–23)
CO2: 20 mmol/L — ABNORMAL LOW (ref 22–32)
Calcium: 9.7 mg/dL (ref 8.9–10.3)
Chloride: 104 mmol/L (ref 98–111)
Creatinine, Ser: 1.25 mg/dL — ABNORMAL HIGH (ref 0.61–1.24)
GFR, Estimated: >60 mL/min (ref >60)
Glucose, Bld: 116 mg/dL — ABNORMAL HIGH (ref 70–99)
Potassium: 4.3 mmol/L (ref 3.5–5.1)
Sodium: 137 mmol/L (ref 135–145)
Total Bilirubin: 0.9 mg/dL (ref 0.3–1.2)
Total Protein: 6.5 g/dL (ref 6.5–8.1)

## 2023-06-23 LAB — CBC
HCT: 29.7 % — ABNORMAL LOW (ref 39.0–52.0)
Hemoglobin: 9.3 g/dL — ABNORMAL LOW (ref 13.0–17.0)
MCH: 25.3 pg — ABNORMAL LOW (ref 26.0–34.0)
MCHC: 31.3 g/dL (ref 30.0–36.0)
MCV: 80.9 fL (ref 80.0–100.0)
Platelets: 265 10*3/uL (ref 150–400)
RBC: 3.67 MIL/uL — ABNORMAL LOW (ref 4.22–5.81)
RDW: 14.6 % (ref 11.5–15.5)
WBC: 15.5 10*3/uL — ABNORMAL HIGH (ref 4.0–10.5)
nRBC: 0 % (ref 0.0–0.2)

## 2023-06-23 LAB — MAGNESIUM: Magnesium: 1.6 mg/dL — ABNORMAL LOW (ref 1.7–2.4)

## 2023-06-23 MED ORDER — SENNOSIDES-DOCUSATE SODIUM 8.6-50 MG PO TABS
1.0000 | ORAL_TABLET | Freq: Two times a day (BID) | ORAL | Status: DC
  Administered 2023-06-23 – 2023-06-29 (×10): 1 via ORAL
  Filled 2023-06-23 (×10): qty 1

## 2023-06-23 MED ORDER — FLUCONAZOLE 200 MG PO TABS
200.0000 mg | ORAL_TABLET | Freq: Every day | ORAL | Status: DC
  Administered 2023-06-24 – 2023-06-29 (×6): 200 mg via ORAL
  Filled 2023-06-23 (×6): qty 1

## 2023-06-23 MED ORDER — SODIUM CHLORIDE 0.9 % IV BOLUS
500.0000 mL | Freq: Once | INTRAVENOUS | Status: AC
  Administered 2023-06-23: 500 mL via INTRAVENOUS

## 2023-06-23 MED ORDER — SODIUM CHLORIDE 0.9 % IV SOLN: INTRAVENOUS | Status: AC

## 2023-06-23 MED ORDER — MAGNESIUM CITRATE PO SOLN
1.0000 | Freq: Once | ORAL | Status: AC
  Administered 2023-06-23: 1 via ORAL
  Filled 2023-06-23: qty 296

## 2023-06-23 MED ORDER — BISACODYL 10 MG RE SUPP: 10.0000 mg | Freq: Every day | RECTAL | Status: DC | PRN

## 2023-06-23 MED ORDER — OXYCODONE HCL 5 MG PO TABS
5.0000 mg | ORAL_TABLET | ORAL | Status: DC | PRN
  Administered 2023-06-24: 10 mg via ORAL
  Filled 2023-06-23: qty 2

## 2023-06-23 MED ORDER — AMLODIPINE BESYLATE 10 MG PO TABS
10.0000 mg | ORAL_TABLET | Freq: Every day | ORAL | Status: DC
  Administered 2023-06-23 – 2023-06-29 (×7): 10 mg via ORAL
  Filled 2023-06-23 (×7): qty 1

## 2023-06-23 MED ORDER — METHYLNALTREXONE BROMIDE 12 MG/0.6ML ~~LOC~~ SOLN
12.0000 mg | Freq: Once | SUBCUTANEOUS | Status: AC
  Administered 2023-06-23: 12 mg via SUBCUTANEOUS
  Filled 2023-06-23: qty 0.6

## 2023-06-23 MED ORDER — ACETAMINOPHEN 325 MG PO TABS
650.0000 mg | ORAL_TABLET | Freq: Four times a day (QID) | ORAL | Status: DC | PRN
  Administered 2023-06-25 – 2023-06-29 (×4): 650 mg via ORAL
  Filled 2023-06-23 (×5): qty 2

## 2023-06-23 MED ORDER — FLUCONAZOLE 200 MG PO TABS
400.0000 mg | ORAL_TABLET | Freq: Once | ORAL | Status: AC
  Administered 2023-06-23: 400 mg via ORAL
  Filled 2023-06-23: qty 2

## 2023-06-23 MED ORDER — LACTULOSE 10 GM/15ML PO SOLN: 30.0000 g | Freq: Two times a day (BID) | ORAL | Status: DC | PRN

## 2023-06-23 MED ORDER — PANTOPRAZOLE SODIUM 40 MG PO TBEC
40.0000 mg | DELAYED_RELEASE_TABLET | Freq: Two times a day (BID) | ORAL | Status: DC
  Administered 2023-06-23 – 2023-06-29 (×13): 40 mg via ORAL
  Filled 2023-06-23 (×13): qty 1

## 2023-06-23 NOTE — Plan of Care (Signed)
  Problem: Health Behavior/Discharge Planning: Goal: Ability to manage health-related needs will improve Outcome: Not Progressing   Problem: Nutritional: Goal: Maintenance of adequate nutrition will improve Outcome: Not Progressing   Problem: Skin Integrity: Goal: Risk for impaired skin integrity will decrease Outcome: Not Progressing

## 2023-06-23 NOTE — Progress Notes (Signed)
Administered metoprolol 5 mg iv fro HR=120s. Rechecked HR=105s.   Lawson Radar, RN

## 2023-06-23 NOTE — Progress Notes (Signed)
Nutrition Brief Note  Patient's diet has been advanced to regular, though RN reports that pt has been throwing up today. Reports that it occurs at any given time and not just around meds/food. Per chart review, pt has not eaten this today. He received AM Ensure but afternoon supplement not provided.   Calorie count initiated today to obtain more objective data on nutritional adequacy. May need to consider supplemental nutrition support if PO intake proves to remain limited with ongoing emesis/regurgitation.   Calorie count envelope hung on door and discussed with RN.   Drusilla Kanner, RDN, LDN Clinical Nutrition

## 2023-06-23 NOTE — Progress Notes (Signed)
Daily Progress Note  DOA: 06/06/2023 Hospital Day: 18  Chief Complaint:  dysphagia, ? Vomiting, constipation  ASSESSMENT    Brief Narrative:  Ahsan Salom. is a 72 y.o. year old male with a history of  renal and pancreatic transplant at [facility], DM, hypertension, left leg BKA, obstructive sleep apnea, and a chronic right foot wound. Hospitalized 9/8 with sepsis 2/2 R foot osteomyelitis. GI evaluated 9/12 for N/V. / chronic dysphagia and abnormal barium swallow . Plan was for EGD but procedure cancelled by Anesthesia due to chills, tachycardia and elevated BP. MRI brain showed acute CVA and GI signed off. Called back to see again today for heaving and dysphagia   Chronic dysphagia / ? Vomiting Abnormal barium swallow with severe dysmotility and non passage of tablet. History of peptic stricture s/p esophageal dilation in 2022.  Patient doesn't not think that dysphagia has changed since acute stroke. Patient gives limited information, doesn't open eyes to engage in conversation. I spoke with his nurse. He didn't eat breakfast and he didn't tolerate some of his oral meds ( sounds like he regurgitated at least one pill within a couple of minutes of taking it). He is getting scheduled Reglan and Zofran. It seems to me that his symptoms are more c/w with dysphagia than true nausea / vomiting.    Constipation. Recent KUB showed significant stool burden.  Getting Miralax BID , Dulcolax supp and 10 mg of oral dulcolax yesterday but still no BM in 4 days.     Acute CVA, on plavix   PLAN   -Though opioids discontinued yesterday he was getting them for several days. Will try a dose of Relistor --Continue maintenance fluids.  --Continue scheduled Reglan --May be worth while treating empirically for candida esophagitis ( IV) --We have been trying to avoid EGD in setting of acute Stroke and being on plavix but at this point might need to reconsider. He isn't really getting much  nutrition. Also, wonder how many of his medications he is actually getting down.    Subjective   Not talkative. No nausea at present. Still no BM   Objective     Recent Labs    06/21/23 1008 06/22/23 0306 06/23/23 0626  WBC 16.9* 13.3* 15.5*  HGB 9.4* 8.2* 9.3*  HCT 30.4* 25.9* 29.7*  PLT 266 228 265   BMET Recent Labs    06/21/23 1008 06/22/23 0306 06/23/23 0626  NA 137 136 137  K 4.5 4.2 4.3  CL 105 106 104  CO2 17* 22 20*  GLUCOSE 118* 94 116*  BUN 28* 27* 30*  CREATININE 1.32* 1.14 1.25*  CALCIUM 9.9 9.5 9.7   LFT Recent Labs    06/23/23 0626  PROT 6.5  ALBUMIN 2.8*  AST 32  ALT 39  ALKPHOS 80  BILITOT 0.9   PT/INR No results for input(s): "LABPROT", "INR" in the last 72 hours.   Imaging:  DG Abd 1 View CLINICAL DATA:  [id number] with nausea and vomiting, and weakness.  EXAM: ABDOMEN - 1 VIEW  COMPARISON:  Last abdomen and pelvis CT was 02/22/2021, last abdomen series was 01/25/2020.  FINDINGS: The bowel gas pattern is nonobstructive, with moderately dense stool with contrast, mild-to-moderate amount in the large bowel with aeration through into the rectum.  There is no supine evidence of free air. Postsurgical changes are present right mid abdomen. Heavy iliofemoral calcific arteriosclerosis.  No radio-opaque calculi or other significant radiographic abnormality are seen. Lung bases are clear.  IMPRESSION: 1. Nonobstructive bowel gas pattern. 2. Moderate dense stool burden with contrast. 3. Heavy iliofemoral calcific arteriosclerosis.  Electronically Signed   By: Almira Bar M.D.   On: 06/20/2023 22:22     Scheduled inpatient medications:   amLODipine  10 mg Oral Daily   vitamin C  1,000 mg Oral Daily   aspirin EC  81 mg Oral Daily   atorvastatin  80 mg Oral QHS   clopidogrel  75 mg Oral Q breakfast   docusate sodium  100 mg Oral BID   enoxaparin (LOVENOX) injection  40 mg Subcutaneous Q24H   feeding supplement  237 mL  Oral BID BM   insulin aspart  0-6 Units Subcutaneous TID WC   insulin glargine-yfgn  7 Units Subcutaneous Daily   leptospermum manuka honey  1 Application Topical Daily   losartan  50 mg Oral Daily   metoCLOPramide (REGLAN) injection  5 mg Intravenous Q8H   mycophenolate  1,000 mg Oral BID   nutrition supplement (JUVEN)  1 packet Oral BID BM   pantoprazole  40 mg Oral BID   polyethylene glycol  17 g Oral BID   potassium chloride SA  20 mEq Oral Daily   sodium chloride flush  3 mL Intravenous Q12H   tacrolimus  5 mg Oral BID   Vitamin D (Ergocalciferol)  50,000 Units Oral Q Wed   zinc sulfate  220 mg Oral Daily   Continuous inpatient infusions:   sodium chloride 75 mL/hr at 06/23/23 1113   PRN inpatient medications: acetaminophen, alum & mag hydroxide-simeth, bisacodyl, guaiFENesin-dextromethorphan, hydrALAZINE, ipratropium-albuterol, magnesium citrate, melatonin, metoprolol tartrate, ondansetron, oxyCODONE, phenol, sodium chloride flush, traZODone  Vital signs in last 24 hours: Temp:  [98.1 F (36.7 C)-102.3 F (39.1 C)] 99.3 F (37.4 C) (09/25 1311) Pulse Rate:  [74-121] 97 (09/25 1311) Resp:  [17-20] 20 (09/25 1311) BP: (122-179)/(50-75) 166/68 (09/25 1311) SpO2:  [99 %-100 %] 100 % (09/25 1311) Last BM Date : 06/18/23  Intake/Output Summary (Last 24 hours) at 06/23/2023 1314 Last data filed at 06/23/2023 1043 Gross per 24 hour  Intake 480 ml  Output --  Net 480 ml    Intake/Output from previous day: No intake/output data recorded. Intake/Output this shift: Total I/O In: 480 [P.O.:480] Out: -    Physical Exam:  General: Awake but eyes close. In NAD Heart:  Regular rate and rhythm.  Pulmonary: Normal respiratory effort Abdomen: Soft, mildly distended, nontender. Hypoactive bowel sounds   Principal Problem:   Osteomyelitis of great toe of right foot (HCC) Active Problems:   PAD (peripheral artery disease) (HCC)   S/P BKA (below knee amputation) unilateral  (HCC)   OSA on CPAP   History of simultaneous kidney and pancreas transplant (HCC)   Dysphagia   Abnormal barium swallow   History of esophageal stricture   Malnutrition of moderate degree     LOS: 17 days   Willette Cluster ,NP 06/23/2023, 1:14 PM

## 2023-06-23 NOTE — Progress Notes (Signed)
Inpatient Rehab Admissions Coordinator:     CIR following. Will see how he tolerates solid diet and then likely send case to insurance tomorrow.   Megan Salon, MS, CCC-SLP Rehab Admissions Coordinator  [phone] (celll) [phone] (office)

## 2023-06-23 NOTE — Progress Notes (Signed)
Pharmacy Antibiotic Note  [name]. is a 72 y.o. male admitted on 06/06/2023 with esophageal candidiasis. Pharmacy has been consulted for fluconazole dosing. CrCl ~55 ml/min.  Plan: Fluconazole 400mg  PO x1 then 200mg  daily  Height: 5\' 10"  (177.8 cm) Weight: 71.1 kg (156 lb 12 oz) IBW/kg (Calculated) : 73  Temp (24hrs), Avg:99 F (37.2 C), Min:98.1 F (36.7 C), Max:102.3 F (39.1 C)  Recent Labs  Lab 06/18/23 0954 06/19/23 0334 06/20/23 0318 06/21/23 1008 06/22/23 0306 06/23/23 0626  WBC  --  12.2* 14.8* 16.9* 13.3* 15.5*  CREATININE  --  1.46* 1.35* 1.32* 1.14 1.25*  VANCOTROUGH 14*  --   --   --   --   --     Estimated Creatinine Clearance: 55.3 mL/min (A) (by C-G formula based on SCr of 1.25 mg/dL (H)).    No Known Allergies    Fredonia Highland, PharmD, BCPS, [facility] Clinical Pharmacist [phone] Please check AMION for all [facility] Pharmacy numbers 06/23/2023

## 2023-06-23 NOTE — Progress Notes (Signed)
[name].  ZOX:[id number] DOB: 11-23-1950 DOA: 06/06/2023 PCP: [name], MD    Brief Narrative:  72 year old with a history of kidney/pancreas transplant, HTN, DM2, left BKA, and chronic right foot wound who presented to the ER 06/06/2023 with chills at which time an MRI of the foot revealed osteomyelitis.  Following his admission a full evaluation directed by orthopedics and vascular surgery revealed poor runoff to the right foot and the patient ultimately underwent a BKA.  His hospitalization has been further complicated by the acute onset of right sided blurry vision leading to a workup which revealed an acute stroke.  Goals of Care:   Code Status: Full Code   DVT prophylaxis: SCD's Start: 06/18/23 1804 enoxaparin (LOVENOX) injection 40 mg Start: 06/11/23 1000   Interim Hx: Blood pressure modestly elevated with systolic 179.  Heart rate modestly elevated at 101-123.  Was noted to have an isolated fever of 102.3 at approximately 8 AM today.  No other localizing symptoms.  Patient is somnolent today but will follow commands and answer simple questions.  Has still not had significant bowel movement.  Assessment & Plan:  Acute on chronic right foot ulcer with osteomyelitis -PVD Has completed a course of broad antibiotic therapy -has been seen in consultation by orthopedic surgery and vascular surgery -ultimately underwent right sided BKA 9/20 -PT/OT recommend inpatient rehab stay  Acute CVA Diagnosed during this hospital stay -has been seen by stroke service/neurology who ultimately signed off 9/15 -recommendation was DAPT x 3 weeks with aspirin and Plavix followed by aspirin alone thereafter  Dysphagia Workup has revealed severe dysmotility on esophagram -GI was consulted for evaluation but this had to be delayed due to the patient's acute CVA 9/14 -during CVA workup patient was cleared for regular diet by SLP -on 9/2 patient developed recurrent dysphagia with nausea and dry  heaving -GI recommended discontinuation of opioids with aggressive treatment of constipation with consideration being given to EGD as an outpatient when the patient's acute illness has fully stabilized -GI has initiated fluconazole for empiric treatment of possible esophageal candidiasis  Isolated fever 9/25 Etiology unclear at present -CXR pending -follow-up CBC in a.m. -check UA if develops symptoms of dysuria which he does not have at present  DM2 CBG reasonably controlled  Status post pancreas and kidney transplant Continue usual antirejection medications to include mycophenolate and tacrolimus  HTN Blood pressure controlled  HLD Continue Lipitor  Nutrition Status: Nutrition Problem: Moderate Malnutrition Etiology: chronic illness (uncontrolled DM) Signs/Symptoms: mild fat depletion, mild muscle depletion Interventions: Ensure Enlive (each supplement provides 350kcal and 20 grams of protein), Refer to RD note for recommendations   Family Communication: Spoke with wife at bedside Disposition: Plan for CIR admission   Objective: Blood pressure (!) 179/75, pulse (!) 120, temperature 99.1 F (37.3 C), temperature source Oral, resp. rate 20, height 5\' 10"  (1.778 m), weight 71.1 kg, SpO2 99%. No intake or output data in the 24 hours ending 06/23/23 0936 Filed Weights   06/16/23 0300 06/17/23 0356 06/18/23 0430  Weight: 67.7 kg 70.2 kg 71.1 kg    Examination: General: No acute respiratory distress Lungs: Clear to auscultation bilaterally without wheezes or crackles Cardiovascular: Regular rate and rhythm without murmur gallop or rub normal S1 and S2 Abdomen: Nontender, nondistended, soft, bowel sounds positive, no rebound, no ascites, no appreciable mass Extremities: No significant cyanosis, clubbing, or edema bilateral lower extremities  CBC: Recent Labs  Lab 06/17/23 2209 06/18/23 0438 06/21/23 1008 06/22/23 0306 06/23/23 8119  WBC 11.9*   < > 16.9* 13.3* 15.5*   NEUTROABS 8.8*  --   --   --   --   HGB 9.7*   < > 9.4* 8.2* 9.3*  HCT 29.9*   < > 30.4* 25.9* 29.7*  MCV 78.9*   < > 81.3 79.7* 80.9  PLT 182   < > 266 228 265   < > = values in this interval not displayed.   Basic Metabolic Panel: Recent Labs  Lab 06/21/23 1008 06/22/23 0306 06/23/23 0626  NA 137 136 137  K 4.5 4.2 4.3  CL 105 106 104  CO2 17* 22 20*  GLUCOSE 118* 94 116*  BUN 28* 27* 30*  CREATININE 1.32* 1.14 1.25*  CALCIUM 9.9 9.5 9.7  MG  --   --  1.6*   GFR: Estimated Creatinine Clearance: 55.3 mL/min (A) (by C-G formula based on SCr of 1.25 mg/dL (H)).   Scheduled Meds:  amLODipine  5 mg Oral Daily   vitamin C  1,000 mg Oral Daily   aspirin EC  81 mg Oral Daily   atorvastatin  80 mg Oral QHS   clopidogrel  75 mg Oral Q breakfast   docusate sodium  100 mg Oral BID   enoxaparin (LOVENOX) injection  40 mg Subcutaneous Q24H   feeding supplement  237 mL Oral BID BM   furosemide  20 mg Oral Daily   insulin aspart  0-6 Units Subcutaneous TID WC   insulin glargine-yfgn  7 Units Subcutaneous Daily   leptospermum manuka honey  1 Application Topical Daily   losartan  50 mg Oral Daily   metoCLOPramide (REGLAN) injection  5 mg Intravenous Q8H   mycophenolate  1,000 mg Oral BID   nutrition supplement (JUVEN)  1 packet Oral BID BM   pantoprazole (PROTONIX) IV  40 mg Intravenous Q12H   polyethylene glycol  17 g Oral BID   potassium chloride SA  20 mEq Oral Daily   sodium chloride flush  3 mL Intravenous Q12H   tacrolimus  5 mg Oral BID   Vitamin D (Ergocalciferol)  50,000 Units Oral Q Wed   zinc sulfate  220 mg Oral Daily   Continuous Infusions:  sodium chloride     lactated ringers Stopped (06/21/23 2053)   magnesium sulfate bolus IVPB       LOS: 17 days   Lonia Blood, MD [facility] Office  [phone] Pager - Text Page per Loretha Stapler  If 7PM-7AM, please contact night-coverage per Amion 06/23/2023, 9:36 AM

## 2023-06-23 NOTE — Progress Notes (Signed)
Attempted to give patient his night time meds. Patient needed to use the bedpan so put the meds on hold.  Attempted again after patient used the bedpan. Patient stated, "not right now". Gave the patient reglan to help ease his stomach.   Patient needed to use the bedpan once again. Patient as cleaned up and asked if he would take his meds. Again, "not right now". Patient also stated that, "he promise he will take his meds".   Will continue to monitor. Will document if patient does not take them

## 2023-06-24 ENCOUNTER — Telehealth: Payer: Self-pay

## 2023-06-24 DIAGNOSIS — K5900 Constipation, unspecified: Secondary | ICD-10-CM | POA: Diagnosis not present

## 2023-06-24 DIAGNOSIS — R933 Abnormal findings on diagnostic imaging of other parts of digestive tract: Secondary | ICD-10-CM | POA: Diagnosis not present

## 2023-06-24 DIAGNOSIS — D649 Anemia, unspecified: Secondary | ICD-10-CM

## 2023-06-24 DIAGNOSIS — R1310 Dysphagia, unspecified: Secondary | ICD-10-CM | POA: Diagnosis not present

## 2023-06-24 DIAGNOSIS — Z8719 Personal history of other diseases of the digestive system: Secondary | ICD-10-CM | POA: Diagnosis not present

## 2023-06-24 LAB — BASIC METABOLIC PANEL
Anion gap: 11 (ref 5–15)
BUN: 17 mg/dL (ref 8–23)
CO2: 21 mmol/L — ABNORMAL LOW (ref 22–32)
Calcium: 9.1 mg/dL (ref 8.9–10.3)
Chloride: 105 mmol/L (ref 98–111)
Creatinine, Ser: 1.1 mg/dL (ref 0.61–1.24)
GFR, Estimated: >60 mL/min (ref >60)
Glucose, Bld: 116 mg/dL — ABNORMAL HIGH (ref 70–99)
Potassium: 4 mmol/L (ref 3.5–5.1)
Sodium: 137 mmol/L (ref 135–145)

## 2023-06-24 LAB — GLUCOSE, CAPILLARY
Glucose-Capillary: 110 mg/dL — ABNORMAL HIGH (ref 70–99)
Glucose-Capillary: 138 mg/dL — ABNORMAL HIGH (ref 70–99)
Glucose-Capillary: 270 mg/dL — ABNORMAL HIGH (ref 70–99)
Glucose-Capillary: 179 mg/dL — ABNORMAL HIGH (ref 70–99)

## 2023-06-24 LAB — CBC
HCT: 26.2 % — ABNORMAL LOW (ref 39.0–52.0)
Hemoglobin: 8.3 g/dL — ABNORMAL LOW (ref 13.0–17.0)
MCH: 25.2 pg — ABNORMAL LOW (ref 26.0–34.0)
MCHC: 31.7 g/dL (ref 30.0–36.0)
MCV: 79.6 fL — ABNORMAL LOW (ref 80.0–100.0)
Platelets: 266 10*3/uL (ref 150–400)
RBC: 3.29 MIL/uL — ABNORMAL LOW (ref 4.22–5.81)
RDW: 14.3 % (ref 11.5–15.5)
WBC: 13.1 10*3/uL — ABNORMAL HIGH (ref 4.0–10.5)
nRBC: 0 % (ref 0.0–0.2)

## 2023-06-24 LAB — SURGICAL PATHOLOGY

## 2023-06-24 MED ORDER — OXYCODONE HCL 5 MG PO TABS
5.0000 mg | ORAL_TABLET | ORAL | Status: DC | PRN
  Administered 2023-06-24 – 2023-06-29 (×12): 5 mg via ORAL
  Filled 2023-06-24 (×13): qty 1

## 2023-06-24 NOTE — Telephone Encounter (Signed)
-----   Message from Imogene Burn sent at 06/24/2023  2:40 PM EDT ----- Marilynn Rail, please arrange for GI clinic follow up with Dr. Leone Payor in 2-3 months for abnormal barium swallow, dysphagia, anemia. Thanks.

## 2023-06-24 NOTE — Telephone Encounter (Signed)
OV scheduled for 09/29/22 at 2:30 pm with Dr. Leone Payor. Patient is currently admitted, appointment information will appear on discharge AVS.

## 2023-06-24 NOTE — Progress Notes (Addendum)
Mobility Specialist Progress Note:   06/24/23 1358  Mobility  Activity Transferred from chair to bed  Level of Assistance Minimal assist, patient does 75% or more (+2)  RLE Weight Bearing NWB  LLE Weight Bearing NWB  Activity Response Tolerated well  Mobility Referral Yes  $Mobility charge 1 Mobility  Mobility Specialist Start Time (ACUTE ONLY) 1240  Mobility Specialist Stop Time (ACUTE ONLY) 1253  Mobility Specialist Time Calculation (min) (ACUTE ONLY) 13 min   Post Mobility: 75 HR   Pt received in chair, requesting to go back to bed. Pt needing MinA +2 with lateral scoot to bed from chair d/t elevated bed height. Pt left in bed with call bell in reach and all needs met.   Leory Plowman  Mobility Specialist Please contact via [employer] office at [phone]

## 2023-06-24 NOTE — Progress Notes (Signed)
Calorie Count Note- Day 1  48 hour calorie count ordered.  Diet: Regular Supplements: Ensure Enlive BID  Estimated Nutritional Needs:  Kcal:  1800-2000 Protein:  90-105g Fluid:  >/=1.8L  Lunch: 1 cracker reported Dinner: no meal tickets retrieved; pt cannot recall Breakfast: 292 kcal and  6g protein Supplements: 350 kcal and 20g protein  Observed breakfast tray this morning which pt consumed 1 banana, about 80% of sausage patty, 0% pancakes. Denies n/v with this meal. Cannot recall whether he had emesis yesterday.   Total intake: ~642 kcal (36% of minimum estimated needs)  ~26g protein (29% of minimum estimated needs)  Calorie count incomplete d/t missing meal tickets/documentation. Spoke with pt at bedside and he is unable to recall intake yesterday for dinner. He currently denies having an episodes of emesis and endorses tolerating breakfast meal well.   Nutrition Dx: Moderate Malnutrition related to chronic illness (uncontrolled DM) as evidenced by mild fat depletion, mild muscle depletion   Goal: Patient will meet greater than or equal to 90% of their needs   Intervention:  48 hour calorie count Ensure Enlive po BID, each supplement provides 350 kcal and 20 grams of protein.  Drusilla Kanner, RDN, LDN Clinical Nutrition

## 2023-06-24 NOTE — Progress Notes (Signed)
Physical Therapy Treatment Patient Details Name: [name]. MRN: [id number] DOB: [birth year] Today's Date: 06/24/2023   History of Present Illness Pt is 72 yo male who presents on 06/06/23 with generalized weakness and fatigue with tremors and chills. Has a wound under R great toe and plans for 1st ray amputation. 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 PMH: kidney transplant, DM, GERD, HTN, PTSD, PVD, L eye blindness, sleep apnea, thyroid disease, L BKA    PT Comments  Pt received in supine and agreeable to session. Pt able to perform mobility tasks with CGA-supervision for safety. Pt requires increased time and frequent rest breaks due to BUE fatigue. Pt's prosthesis still appears to not work, so unable to attempt standing this session. Pt demonstrates good ability to elevate bottom during scoots and is able to lateral transfer to recliner with many small scoots. Pt continues to benefit from PT services to progress toward functional mobility goals.    If plan is discharge home, recommend the following: Help with stairs or ramp for entrance;Assistance with cooking/housework;Assist for transportation;A lot of help with walking and/or transfers;A lot of help with bathing/dressing/bathroom   Can travel by private vehicle        Equipment Recommendations  Wheelchair (measurements PT);Wheelchair cushion (measurements PT)    Recommendations for Other Services       Precautions / Restrictions Precautions Precautions: Fall Precaution Comments: wound vac, watch HR, vision impairment Required Braces or Orthoses: Other Brace Other Brace: L prosthesis (unable to don this date, release button locked up) Restrictions Weight Bearing Restrictions: Yes RLE Weight Bearing: Non weight bearing LLE Weight Bearing: Non weight bearing     Mobility  Bed Mobility Overal bed  mobility: Needs Assistance Bed Mobility: Supine to Sit     Supine to sit: Contact guard, Used rails, HOB elevated     General bed mobility comments: increased time and frequent rest breaks due to quick fatigue, but no physical assist needed    Transfers Overall transfer level: Needs assistance Equipment used: None Transfers: Bed to chair/wheelchair/BSC            Lateral/Scoot Transfers: Supervision General transfer comment: increased time    Modified Rankin (Stroke Patients Only) Modified Rankin (Stroke Patients Only) Pre-Morbid Rankin Score: Slight disability Modified Rankin: Moderately severe disability     Balance Overall balance assessment: Needs assistance Sitting-balance support: No upper extremity supported Sitting balance-Leahy Scale: Good Sitting balance - Comments: sitting EOB       Standing balance comment: NT                            Cognition Arousal: Alert Behavior During Therapy: WFL for tasks assessed/performed Overall Cognitive Status: Within Functional Limits for tasks assessed                                          Exercises      General Comments General comments (skin integrity, edema, etc.): Unable to don prosthesis      Pertinent Vitals/Pain Pain Assessment Pain Assessment: Faces Faces Pain Scale: Hurts a little bit Pain Location: RLE Pain Descriptors / Indicators: Guarding, Discomfort Pain Intervention(s): Monitored during session, Repositioned     PT Goals (current goals can  now be found in the care plan section) Acute Rehab PT Goals Patient Stated Goal: return home, open to CIR admission PT Goal Formulation: With patient Time For Goal Achievement: 07/03/23 Progress towards PT goals: Progressing toward goals    Frequency    Min 1X/week       AM-PAC PT "6 Clicks" Mobility   Outcome Measure  Help needed turning from your back to your side while in a flat bed without using  bedrails?: A Little Help needed moving from lying on your back to sitting on the side of a flat bed without using bedrails?: A Little Help needed moving to and from a bed to a chair (including a wheelchair)?: A Little Help needed standing up from a chair using your arms (e.g., wheelchair or bedside chair)?: Total Help needed to walk in hospital room?: Total Help needed climbing 3-5 steps with a railing? : Total 6 Click Score: 12    End of Session Equipment Utilized During Treatment:  (R limb protector) Activity Tolerance: Patient tolerated treatment well;Patient limited by fatigue Patient left: in chair;with call bell/phone within reach Nurse Communication: Mobility status PT Visit Diagnosis: Muscle weakness (generalized) (M62.81)     Time: 1610-9604 PT Time Calculation (min) (ACUTE ONLY): 25 min  Charges:    $Therapeutic Activity: 23-37 mins PT General Charges $$ ACUTE PT VISIT: 1 Visit                    Johny Shock, PTA Acute Rehabilitation Services Secure Chat Preferred  [phone]    Johny Shock 06/24/2023, 12:31 PM

## 2023-06-24 NOTE — Progress Notes (Signed)
[name].  [id number] DOB: Jan 17, 1951 DOA: 06/06/2023 PCP: [name], MD    Brief Narrative:  72 year old with a history of kidney/pancreas transplant, HTN, DM2, left BKA, and chronic right foot wound who presented to the ER 06/06/2023 with chills at which time an MRI of the foot revealed osteomyelitis.  Following his admission a full evaluation directed by orthopedics and vascular surgery revealed poor runoff to the right foot and the patient ultimately underwent a BKA.  His hospitalization has been further complicated by the acute onset of right sided blurry vision leading to a workup which revealed an acute stroke.  Goals of Care:   Code Status: Full Code   DVT prophylaxis: SCD's Start: 06/18/23 1804 enoxaparin (LOVENOX) injection 40 mg Start: 06/11/23 1000   Interim Hx: Patient had multiple bowel movements last night.  He tells me he feels dramatically better and is absolutely giddy at the time of my visit.  There has been no recurrence of fever since 08:00 yesterday.  Vitals are stable and oxygen saturation is 100% on room air.  CXR obtained yesterday afternoon was not convincing for a true pulmonary infiltrate with findings most suggestive of atelectasis per this MD review. Pt denies cough or other resp sx to support a diagnosis of PNA.   Assessment & Plan:  Acute on chronic right foot ulcer with osteomyelitis - PVD Has completed a course of broad antibiotic therapy -has been seen in consultation by orthopedic surgery and vascular surgery -ultimately underwent right sided BKA 9/20 -PT/OT recommend inpatient rehab stay  Acute CVA Diagnosed during this hospital stay -has been seen by stroke service/neurology who ultimately signed off 9/15 -recommendation was DAPT x 3 weeks with aspirin and Plavix followed by aspirin alone thereafter  Dysphagia Workup has revealed severe dysmotility on esophagram -GI was consulted for evaluation but this had to be delayed due to the  patient's acute CVA 9/14 -during CVA workup patient was cleared for regular diet by SLP -on 9/2 patient developed recurrent dysphagia with nausea and dry heaving -GI recommended discontinuation of opioids with aggressive treatment of constipation with consideration being given to EGD as an outpatient when the patient's acute illness has fully stabilized -GI has initiated fluconazole for empiric treatment of possible esophageal candidiasis -much improved clinically today with patient tolerating regular diet without difficulty  Isolated fever 9/25 Etiology unclear -CXR not convincing for a true pulmonary infiltrate on my review - follow-up CBC today reveals improving WBC count -will check UA if develops symptoms of dysuria which he does not have at present  DM2 CBG well-controlled at present  Status post pancreas and kidney transplant Continue usual antirejection medications to include mycophenolate and tacrolimus  HTN Blood pressure controlled at present  HLD Continue Lipitor  Nutrition Status: Nutrition Problem: Moderate Malnutrition Etiology: chronic illness (uncontrolled DM) Signs/Symptoms: mild fat depletion, mild muscle depletion Interventions: Ensure Enlive (each supplement provides 350kcal and 20 grams of protein), Refer to RD note for recommendations   Family Communication: No family present at time of exam today Disposition: Plan for CIR admission -now medically stable for CIR   Objective: Blood pressure (!) 140/58, pulse 93, temperature 97.7 F (36.5 C), temperature source Oral, resp. rate 20, height 5\' 10"  (1.778 m), weight 69.8 kg, SpO2 100%.  Intake/Output Summary (Last 24 hours) at 06/24/2023 0821 Last data filed at 06/24/2023 0433 Gross per 24 hour  Intake 2368.2 ml  Output 800 ml  Net 1568.2 ml   Filed Weights   06/17/23  1191 06/18/23 0430 06/24/23 0432  Weight: 70.2 kg 71.1 kg 69.8 kg    Examination: General: No acute respiratory distress Lungs: Clear to  auscultation bilaterally -no wheezing Cardiovascular: Regular rate and rhythm without murmur  Abdomen: Nontender, nondistended, soft, bowel sounds positive, no rebound, no ascites, no appreciable mass Extremities: No significant edema peripherally  CBC: Recent Labs  Lab 06/17/23 2209 06/18/23 0438 06/22/23 0306 06/23/23 0626 06/24/23 0500  WBC 11.9*   < > 13.3* 15.5* 13.1*  NEUTROABS 8.8*  --   --   --   --   HGB 9.7*   < > 8.2* 9.3* 8.3*  HCT 29.9*   < > 25.9* 29.7* 26.2*  MCV 78.9*   < > 79.7* 80.9 79.6*  PLT 182   < > 228 265 266   < > = values in this interval not displayed.   Basic Metabolic Panel: Recent Labs  Lab 06/22/23 0306 06/23/23 0626 06/24/23 0500  NA 136 137 137  K 4.2 4.3 4.0  CL 106 104 105  CO2 22 20* 21*  GLUCOSE 94 116* 116*  BUN 27* 30* 17  CREATININE 1.14 1.25* 1.10  CALCIUM 9.5 9.7 9.1  MG  --  1.6*  --    GFR: Estimated Creatinine Clearance: 61.7 mL/min (by C-G formula based on SCr of 1.1 mg/dL).   Scheduled Meds:  amLODipine  10 mg Oral Daily   vitamin C  1,000 mg Oral Daily   aspirin EC  81 mg Oral Daily   atorvastatin  80 mg Oral QHS   clopidogrel  75 mg Oral Q breakfast   enoxaparin (LOVENOX) injection  40 mg Subcutaneous Q24H   feeding supplement  237 mL Oral BID BM   fluconazole  200 mg Oral Daily   insulin aspart  0-6 Units Subcutaneous TID WC   insulin glargine-yfgn  7 Units Subcutaneous Daily   leptospermum manuka honey  1 Application Topical Daily   losartan  50 mg Oral Daily   metoCLOPramide (REGLAN) injection  5 mg Intravenous Q8H   mycophenolate  1,000 mg Oral BID   nutrition supplement (JUVEN)  1 packet Oral BID BM   pantoprazole  40 mg Oral BID   polyethylene glycol  17 g Oral BID   potassium chloride SA  20 mEq Oral Daily   senna-docusate  1 tablet Oral BID   sodium chloride flush  3 mL Intravenous Q12H   tacrolimus  5 mg Oral BID   Vitamin D (Ergocalciferol)  50,000 Units Oral Q Wed   zinc sulfate  220 mg Oral  Daily   Continuous Infusions:  sodium chloride 75 mL/hr at 06/24/23 0630     LOS: 18 days   Lonia Blood, MD [facility] Office  [phone] Pager - Text Page per Loretha Stapler  If 7PM-7AM, please contact night-coverage per Amion 06/24/2023, 8:21 AM

## 2023-06-24 NOTE — Progress Notes (Signed)
Patient never did take meds. Documented not given/refused

## 2023-06-24 NOTE — Progress Notes (Signed)
Progress Note   Subjective  Day #19 Chief Complaint: Dysphagia?,  Vomiting, constipation  Today, patient is in a delightful mood.  He tells me he was up all night going to the bathroom and praising hallelujah.  He has had multiple bowel movements and feels almost completely better.  He has a tray by his bedside with a half eaten pancake and sausage and tells me he has had no further vomiting and does not feel like the food is getting stuck on its way down.  He is very happy.   Objective   Vital signs in last 24 hours: Temp:  [97.7 F (36.5 C)-99.3 F (37.4 C)] 98.4 F (36.9 C) (09/26 0916) Pulse Rate:  [84-104] 84 (09/26 0916) Resp:  [16-20] 16 (09/26 0916) BP: (136-184)/(58-84) 154/63 (09/26 0916) SpO2:  [100 %] 100 % (09/26 0916) Weight:  [69.8 kg] 69.8 kg (09/26 0432) Last BM Date : 06/23/23 General:    AA male in NAD Heart:  Regular rate and rhythm; no murmurs Lungs: Respirations even and unlabored, lungs CTA bilaterally Abdomen:  Soft, nontender and nondistended. Normal bowel sounds. Psych:  Cooperative. Normal mood and affect.  Intake/Output from previous day: 09/25 0701 - 09/26 0700 In: 2368.2 [P.O.:960; I.V.:915; IV Piggyback:493.2] Out: 800 [Urine:800]   Lab Results: Recent Labs    06/22/23 0306 06/23/23 0626 06/24/23 0500  WBC 13.3* 15.5* 13.1*  HGB 8.2* 9.3* 8.3*  HCT 25.9* 29.7* 26.2*  PLT 228 265 266   BMET Recent Labs    06/22/23 0306 06/23/23 0626 06/24/23 0500  NA 136 137 137  K 4.2 4.3 4.0  CL 106 104 105  CO2 22 20* 21*  GLUCOSE 94 116* 116*  BUN 27* 30* 17  CREATININE 1.14 1.25* 1.10  CALCIUM 9.5 9.7 9.1   LFT Recent Labs    06/23/23 0626  PROT 6.5  ALBUMIN 2.8*  AST 32  ALT 39  ALKPHOS 80  BILITOT 0.9   Studies/Results: DG CHEST PORT 1 VIEW  Result Date: 06/23/2023 CLINICAL DATA:  Fever EXAM: PORTABLE CHEST 1 VIEW COMPARISON:  Chest CT 08/10/2006. FINDINGS: There are minimal patchy opacities in both lung bases. The  cardiomediastinal silhouette demonstrates mild cardiomegaly. There is no pleural effusion or pneumothorax. No acute fractures are seen. IMPRESSION: Minimal patchy opacities in both lung bases could reflect atelectasis or infection. Electronically Signed   By: Darliss Cheney M.D.   On: 06/23/2023 21:15       Assessment / Plan:   Assessment: 1.  Chronic dysphagia/question vomiting: Abnormal barium swallow with severe dysmotility and passage of tablet, history of peptic stricture status post dilation in 2022 2.  Constipation: KUB showed significant stool burden, was on MiraLAX twice daily, Dulcolax suppository and 10 mg of oral Dulcolax, had still not had a bowel movement and was given a dose of Relistor, had significant passage of stool overnight and feels much better 3.  Acute CVA: On Plavix 4.  Leukocytosis: 15.5--> 13.1 overnight, chest x-ray did show patchy opacities in both lung bases which could reflect atelectasis or infection  Plan: 1.  Continue maintenance fluids 2.  Continue scheduled Reglan 3.  Continue Fluconazole per pharmacy for possible esophageal candidiasis 4.  Continue on current bowel regimen so hopefully we can keep things moving and he continues to feel well 5.  White count is coming down today but chest x-ray with possibility of infection vs atelectasis, will leave possible antibiotics up to Dr. Leonides Schanz after her review  Thank you  for your kind consultation, we will continue to follow.    LOS: 18 days   Unk Lightning  06/24/2023, 10:52 AM

## 2023-06-24 NOTE — Plan of Care (Signed)
  Problem: Metabolic: Goal: Ability to maintain appropriate glucose levels will improve Outcome: Progressing   Problem: Health Behavior/Discharge Planning: Goal: Ability to manage health-related needs will improve Outcome: Not Progressing

## 2023-06-25 DIAGNOSIS — M869 Osteomyelitis, unspecified: Secondary | ICD-10-CM | POA: Diagnosis not present

## 2023-06-25 LAB — GLUCOSE, CAPILLARY
Glucose-Capillary: 162 mg/dL — ABNORMAL HIGH (ref 70–99)
Glucose-Capillary: 175 mg/dL — ABNORMAL HIGH (ref 70–99)
Glucose-Capillary: 192 mg/dL — ABNORMAL HIGH (ref 70–99)

## 2023-06-25 LAB — CBC
HCT: 25 % — ABNORMAL LOW (ref 39.0–52.0)
Hemoglobin: 8.3 g/dL — ABNORMAL LOW (ref 13.0–17.0)
MCH: 25.9 pg — ABNORMAL LOW (ref 26.0–34.0)
MCHC: 33.2 g/dL (ref 30.0–36.0)
MCV: 78.1 fL — ABNORMAL LOW (ref 80.0–100.0)
Platelets: 266 10*3/uL (ref 150–400)
RBC: 3.2 MIL/uL — ABNORMAL LOW (ref 4.22–5.81)
RDW: 14.1 % (ref 11.5–15.5)
WBC: 11.5 10*3/uL — ABNORMAL HIGH (ref 4.0–10.5)
nRBC: 0.3 % — ABNORMAL HIGH (ref 0.0–0.2)

## 2023-06-25 MED ORDER — METOCLOPRAMIDE HCL 5 MG PO TABS
5.0000 mg | ORAL_TABLET | Freq: Three times a day (TID) | ORAL | Status: DC
  Administered 2023-06-26 – 2023-06-29 (×8): 5 mg via ORAL
  Filled 2023-06-25 (×10): qty 1

## 2023-06-25 NOTE — Care Management Important Message (Signed)
Important Message  Patient Details  Name: [name]. MRN: [id number] Date of Birth: 01/27/1951   Important Message Given:  Yes - Medicare IM     Sherilyn Banker 06/25/2023, 3:07 PM

## 2023-06-25 NOTE — Plan of Care (Signed)
  Problem: Metabolic: Goal: Ability to maintain appropriate glucose levels will improve 06/25/2023 0612 by Jill Side, RN Outcome: Progressing 06/24/2023 2350 by Jill Side, RN Outcome: Progressing   Problem: Health Behavior/Discharge Planning: Goal: Ability to manage health-related needs will improve Outcome: Not Progressing

## 2023-06-25 NOTE — Progress Notes (Signed)
PT Cancellation Note  Patient Details Name: [name]. MRN: [id number] DOB: 08/19/1951   Cancelled Treatment:    Reason Eval/Treat Not Completed: (P) Patient declined, no reason specified (Pt reporting fatigue and declining mobility at this time.)   Johny Shock 06/25/2023, 4:07 PM

## 2023-06-25 NOTE — Progress Notes (Signed)
Inpatient Rehab Admissions Coordinator:    CIR following. Case sent to insurance and I await a decision. I will follow for potential admit pending insurance approval. If not approved, Pt. May need SNF.   Megan Salon, MS, CCC-SLP Rehab Admissions Coordinator  [phone] (celll) [phone] (office)

## 2023-06-25 NOTE — TOC Progression Note (Signed)
Transition of Care (TOC) - Progression Note    Patient Details  Name: [name]. MRN: [id number] Date of Birth: [birth year]  Transition of Care [facility]) CM/SW Contact  Lockie Pares, RN Phone Number: 06/25/2023, 12:50 PM  Clinical Narrative:    Messaged with Dr Sharon Seller via securechat regarding readiness for next level /post- hospitalization. He is ready to transition. CIR coordinator  will start authorization with a back up plan being SNF  if patient agrees.  Will continue to follow   Expected Discharge Plan: IP Rehab Facility Barriers to Discharge: Continued Medical Work up  Expected Discharge Plan and Services                                               Social Determinants of Health (SDOH) Interventions SDOH Screenings   Food Insecurity: No Food Insecurity (06/07/2023)  Housing: Medium Risk (06/07/2023)  Transportation Needs: Unmet Transportation Needs (06/07/2023)  Utilities: Not At Risk (06/07/2023)  Depression (PHQ2-9): Low Risk  (02/02/2020)  Social Connections: Unknown (02/06/2022)   Received from [facility]  Tobacco Use: Medium Risk (06/18/2023)    Readmission Risk Interventions     No data to display

## 2023-06-25 NOTE — Progress Notes (Signed)
[name].  NWG:[id number] DOB: 03-08-1951 DOA: 06/06/2023 PCP: [name], MD    Brief Narrative:  72 year old with a history of kidney/pancreas transplant, HTN, DM2, left BKA, and chronic right foot wound who presented to the ER 06/06/2023 with chills at which time an MRI of the foot revealed osteomyelitis.  Following his admission a full evaluation directed by orthopedics and vascular surgery revealed poor runoff to the right foot and the patient ultimately underwent a BKA.  His hospitalization has been further complicated by the acute onset of right sided blurry vision leading to a workup which revealed an acute stroke.  Goals of Care:   Code Status: Full Code   DVT prophylaxis: SCD's Start: 06/18/23 1804 enoxaparin (LOVENOX) injection 40 mg Start: 06/11/23 1000   Interim Hx: No acute events reported overnight.  Afebrile.  Vital stable.  Ready for admission to inpatient rehab.  Sitting up in a bedside chair resting comfortably.  No fevers chills.  No shortness of breath.  Good appetite with no nausea or vomiting.  Assessment & Plan:  Acute on chronic right foot ulcer with osteomyelitis - PVD Has completed a course of broad antibiotic therapy -has been seen in consultation by orthopedic surgery and vascular surgery -ultimately underwent right sided BKA 9/20 -PT/OT recommend inpatient rehab stay  Acute CVA Diagnosed during this hospital stay -has been seen by stroke service/neurology who ultimately signed off 9/15 -recommendation was DAPT x 3 weeks with aspirin and Plavix followed by aspirin alone thereafter  Dysphagia Workup has revealed severe dysmotility on esophagram -GI was consulted for evaluation but this had to be delayed due to the patient's acute CVA 9/14 -during CVA workup patient was cleared for regular diet by SLP -on 9/2 patient developed recurrent dysphagia with nausea and dry heaving -GI recommended discontinuation of opioids with aggressive treatment of  constipation with consideration being given to EGD as an outpatient when the patient's acute illness has fully stabilized -GI has initiated fluconazole for empiric treatment of possible esophageal candidiasis -much improved clinically following these measures with patient tolerating regular diet without difficulty  Isolated fever 9/25 Etiology unclear -CXR not convincing for a true pulmonary infiltrate, and no other sx to support PNA  - follow-up CBC revealed improving WBC count -no clinical symptoms to suggest UTI  DM2 CBG well-controlled at present  Status post pancreas and kidney transplant Continue usual antirejection medications to include mycophenolate and tacrolimus -no evidence of acute complications  HTN Blood pressure controlled at present  HLD Continue Lipitor  Nutrition Status: Nutrition Problem: Moderate Malnutrition Etiology: chronic illness (uncontrolled DM) Signs/Symptoms: mild fat depletion, mild muscle depletion Interventions: Ensure Enlive (each supplement provides 350kcal and 20 grams of protein), Refer to RD note for recommendations   Family Communication: No family present at time of exam today Disposition: Medically stable for discharge -awaiting CIR admission   Objective: Blood pressure (!) 162/70, pulse 94, temperature 98.2 F (36.8 C), temperature source Oral, resp. rate 18, height 5\' 10"  (1.778 m), weight 69.8 kg, SpO2 100%.  Intake/Output Summary (Last 24 hours) at 06/25/2023 1616 Last data filed at 06/25/2023 0454 Gross per 24 hour  Intake 0 ml  Output 500 ml  Net -500 ml   Filed Weights   06/17/23 0356 06/18/23 0430 06/24/23 0432  Weight: 70.2 kg 71.1 kg 69.8 kg    Examination: General: No acute respiratory distress Lungs: Clear to auscultation bilaterally  Cardiovascular: Regular rate and rhythm without murmur  Abdomen: Nontender, nondistended, soft, bowel sounds  positive, no rebound, no ascites, no appreciable mass Extremities: No  significant edema peripherally  CBC: Recent Labs  Lab 06/23/23 0626 06/24/23 0500 06/25/23 0958  WBC 15.5* 13.1* 11.5*  HGB 9.3* 8.3* 8.3*  HCT 29.7* 26.2* 25.0*  MCV 80.9 79.6* 78.1*  PLT 265 266 266   Basic Metabolic Panel: Recent Labs  Lab 06/22/23 0306 06/23/23 0626 06/24/23 0500  NA 136 137 137  K 4.2 4.3 4.0  CL 106 104 105  CO2 22 20* 21*  GLUCOSE 94 116* 116*  BUN 27* 30* 17  CREATININE 1.14 1.25* 1.10  CALCIUM 9.5 9.7 9.1  MG  --  1.6*  --    GFR: Estimated Creatinine Clearance: 61.7 mL/min (by C-G formula based on SCr of 1.1 mg/dL).   Scheduled Meds:  amLODipine  10 mg Oral Daily   vitamin C  1,000 mg Oral Daily   aspirin EC  81 mg Oral Daily   atorvastatin  80 mg Oral QHS   clopidogrel  75 mg Oral Q breakfast   enoxaparin (LOVENOX) injection  40 mg Subcutaneous Q24H   feeding supplement  237 mL Oral BID BM   fluconazole  200 mg Oral Daily   insulin aspart  0-6 Units Subcutaneous TID WC   insulin glargine-yfgn  7 Units Subcutaneous Daily   leptospermum manuka honey  1 Application Topical Daily   losartan  50 mg Oral Daily   metoCLOPramide (REGLAN) injection  5 mg Intravenous Q8H   [START ON 06/26/2023] metoCLOPramide  5 mg Oral TID AC   mycophenolate  1,000 mg Oral BID   nutrition supplement (JUVEN)  1 packet Oral BID BM   pantoprazole  40 mg Oral BID   polyethylene glycol  17 g Oral BID   potassium chloride SA  20 mEq Oral Daily   senna-docusate  1 tablet Oral BID   sodium chloride flush  3 mL Intravenous Q12H   tacrolimus  5 mg Oral BID   Vitamin D (Ergocalciferol)  50,000 Units Oral Q Wed   zinc sulfate  220 mg Oral Daily     LOS: 19 days   Lonia Blood, MD [facility] Office  [phone] Pager - Text Page per Loretha Stapler  If 7PM-7AM, please contact night-coverage per Amion 06/25/2023, 4:16 PM

## 2023-06-25 NOTE — Progress Notes (Signed)
Calorie Count Note- Day 2  48 hour calorie count ordered.  Diet: Regular Supplements: Ensure Enlive BID   Estimated Nutritional Needs:  Kcal:  1800-2000 Protein:  90-105g Fluid:  >/=1.8L  Lunch: 426 kcal and 16g protein Dinner: none documented Breakfast: 180 kcal and 6g protein Supplements: ~438 kcal and 25g protein (1.5 ensure x24 hours)  Total intake: 1044 kcal kcal (58% of minimum estimated needs)  47g protein (52% of minimum estimated needs)  Pt's PO intake appears to be improving. Unfortunately, unable to obtain all meal tickets to accurately assess nutritional adequacy. However, per discussion with RN, over the last 24 hours, there are no reports of emesis or regurgitation with po intake. Plan to continue with current nutrition interventions- Regular diet and Ensure nutrition supplements.   Nutrition Dx: Moderate Malnutrition related to chronic illness (uncontrolled DM) as evidenced by mild fat depletion, mild muscle depletion    Goal: Patient will meet greater than or equal to 90% of their needs    Intervention:  48 hour calorie count Ensure Enlive po BID, each supplement provides 350 kcal and 20 grams of protein.  Drusilla Kanner, RDN, LDN Clinical Nutrition

## 2023-06-25 NOTE — Evaluation (Addendum)
Occupational Therapy Evaluation Patient Details Name: [name]. MRN: [id number] DOB: Feb 06, 1951 Today's Date: 06/25/2023   History of Present Illness Pt is 72 yo male who presents on 06/06/23 with generalized weakness and fatigue with tremors and chills. Has a wound under R great toe and plans for 1st ray amputation. 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 PMH: kidney transplant, DM, GERD, HTN, PTSD, PVD, L eye blindness, sleep apnea, thyroid disease, L BKA   Clinical Impression   Pt continuing to progress in OT sessions, needs assist for posterior transfers due to weakness in BUEs. Educated pt on positioning of BSC to prevent tipping and the use of chair push ups while in recliner to continue to build UB strength, he verbalized understanding. Also educated pt on lateral leans in recliner for pressure relief, pt continues to be motivated to perform functional tasks independently, did well with lateral leans to perform rear pericare. Recommend staff procure a drop arm BSC to prevent excessive challenges with AP transfers in room setup. OT to continue to progress pt as able, Patient has the potential to reach Mod I and demos the ability to tolerate 3 hours of therapy. Pt would benefit from an intensive rehab program to help maximize functional independence.       If plan is discharge home, recommend the following: Two people to help with walking and/or transfers;A lot of help with bathing/dressing/bathroom;Assistance with cooking/housework;Assist for transportation;Help with stairs or ramp for entrance    Functional Status Assessment  Patient has had a recent decline in their functional status and demonstrates the ability to make significant improvements in function in a reasonable and predictable amount of time.  Equipment Recommendations  None recommended by OT (TBD at  next level)    Recommendations for Other Services       Precautions / Restrictions Precautions Precautions: Fall Precaution Comments: wound vac, watch HR, vision impairment Other Brace: L prosthesis (unable to don this date, release button locked up) Restrictions Weight Bearing Restrictions: Yes RLE Weight Bearing: Non weight bearing LLE Weight Bearing: Non weight bearing      Mobility Bed Mobility Overal bed mobility: Needs Assistance             General bed mobility comments: Posterior scooting with min A, cues for hand placement and head turning to assist pt with lift offs and navigation. Repositioned to long sitting with CGA    Transfers Overall transfer level: Needs assistance   Transfers: Bed to chair/wheelchair/BSC     Squat pivot transfers: Total assist       General transfer comment: Pt expressed that having his RLE propped on bed was becoming painful, and hsi bottom was hurting while on commode. Pt elected to have therapist lift him over to recliner from [facility] instead of peforming a transfer back to wet bed then to recliner. Total A due to bilat BKA      Balance Overall balance assessment: Needs assistance Sitting-balance support: No upper extremity supported Sitting balance-Leahy Scale: Good Sitting balance - Comments: in recliner       Standing balance comment: NT                           ADL either performed or assessed with clinical judgement   ADL Overall ADL's : Needs assistance/impaired     Grooming:  Sitting;Wash/dry face;Bed level                   Toilet Transfer: Minimal assistance;Anterior/posterior Toilet Transfer Details (indicate cue type and reason): increased time needed to perform, two 1 min rest breaks due to fatigue. Toileting- Clothing Manipulation and Hygiene: Sitting/lateral lean;Contact guard assist Toileting - Clothing Manipulation Details (indicate cue type and reason): Educated pt on lateral leans to  aid in wiping self, recommended pt have someone hold BSC while leaning for safety. OT held [facility] in place during session             Vision         Perception         Praxis         Pertinent Vitals/Pain Pain Assessment Pain Assessment: 0-10 Pain Score: 10-Worst pain ever Pain Location: RLE when propped up on bed, and some pain on bottom while on BSC Pain Descriptors / Indicators: Aching, Discomfort Pain Intervention(s): Repositioned, Monitored during session, Patient requesting pain meds-RN notified, Limited activity within patient's tolerance (RN not available, and did not answer phone at the time. Notified NT at front desk)     Extremity/Trunk Assessment             Communication Communication Communication: No apparent difficulties   Cognition Arousal: Alert Behavior During Therapy: WFL for tasks assessed/performed Overall Cognitive Status: Within Functional Limits for tasks assessed                                       General Comments  VSS on RA, notified pt that [employer] reps may be around today to look at his prosthesis    Exercises     Shoulder Instructions      Home Living                                          Prior Functioning/Environment                          OT Problem List: Decreased strength;Decreased activity tolerance;Impaired balance (sitting and/or standing);Decreased knowledge of use of DME or AE;Decreased knowledge of precautions;Decreased safety awareness      OT Treatment/Interventions: Self-care/ADL training;Therapeutic exercise;DME and/or AE instruction;Therapeutic activities;Patient/family education;Balance training;Cognitive remediation/compensation    OT Goals(Current goals can be found in the care plan section) Acute Rehab OT Goals Patient Stated Goal: To keep doing for self OT Goal Formulation: With patient Time For Goal Achievement: 07/03/23 Potential to Achieve  Goals: Good  OT Frequency: Min 2X/week    Co-evaluation              AM-PAC OT "6 Clicks" Daily Activity     Outcome Measure Help from another person eating meals?: A Little Help from another person taking care of personal grooming?: A Little Help from another person toileting, which includes using toliet, bedpan, or urinal?: A Little Help from another person bathing (including washing, rinsing, drying)?: A Lot Help from another person to put on and taking off regular upper body clothing?: A Little Help from another person to put on and taking off regular lower body clothing?: A Lot 6 Click Score: 16   End of Session Nurse Communication: Mobility status;Patient requests pain meds (Needs meds and is a  lateral scoot back to bed. Linens need changing)  Activity Tolerance: Patient tolerated treatment well Patient left: in chair;with call bell/phone within reach  OT Visit Diagnosis: Other abnormalities of gait and mobility (R26.89);Muscle weakness (generalized) (M62.81)                Time: 7829-5621 OT Time Calculation (min): 34 min Charges:  OT General Charges $OT Visit: 1 Visit OT Treatments $Self Care/Home Management : 8-22 mins $Therapeutic Activity: 8-22 mins  06/25/2023  AB, OTR/L  Acute Rehabilitation Services  Office: [phone]   Tristan Schroeder 06/25/2023, 10:39 AM

## 2023-06-26 DIAGNOSIS — M869 Osteomyelitis, unspecified: Secondary | ICD-10-CM | POA: Diagnosis not present

## 2023-06-26 LAB — GLUCOSE, CAPILLARY
Glucose-Capillary: 99 mg/dL (ref 70–99)
Glucose-Capillary: 100 mg/dL — ABNORMAL HIGH (ref 70–99)
Glucose-Capillary: 145 mg/dL — ABNORMAL HIGH (ref 70–99)
Glucose-Capillary: 170 mg/dL — ABNORMAL HIGH (ref 70–99)

## 2023-06-26 NOTE — Plan of Care (Signed)
Problem: Education: Goal: Ability to describe self-care measures that may prevent or decrease complications (Diabetes Survival Skills Education) will improve 06/26/2023 1822 by Stormy Card, RN Outcome: Progressing 06/26/2023 1819 by Stormy Card, RN Outcome: Progressing   Problem: Coping: Goal: Ability to adjust to condition or change in health will improve 06/26/2023 1822 by Stormy Card, RN Outcome: Progressing 06/26/2023 1819 by Stormy Card, RN Outcome: Progressing   Problem: Fluid Volume: Goal: Ability to maintain a balanced intake and output will improve 06/26/2023 1822 by Stormy Card, RN Outcome: Progressing 06/26/2023 1819 by Stormy Card, RN Outcome: Progressing   Problem: Health Behavior/Discharge Planning: Goal: Ability to identify and utilize available resources and services will improve 06/26/2023 1822 by Stormy Card, RN Outcome: Progressing 06/26/2023 1819 by Stormy Card, RN Outcome: Progressing Goal: Ability to manage health-related needs will improve 06/26/2023 1822 by Stormy Card, RN Outcome: Progressing 06/26/2023 1819 by Stormy Card, RN Outcome: Progressing   Problem: Metabolic: Goal: Ability to maintain appropriate glucose levels will improve 06/26/2023 1822 by Stormy Card, RN Outcome: Progressing 06/26/2023 1819 by Stormy Card, RN Outcome: Progressing   Problem: Nutritional: Goal: Maintenance of adequate nutrition will improve 06/26/2023 1822 by Stormy Card, RN Outcome: Progressing 06/26/2023 1819 by Stormy Card, RN Outcome: Progressing Goal: Progress toward achieving an optimal weight will improve 06/26/2023 1822 by Stormy Card, RN Outcome: Progressing 06/26/2023 1819 by Stormy Card, RN Outcome: Progressing   Problem: Skin Integrity: Goal: Risk for impaired skin integrity will decrease 06/26/2023 1822 by Stormy Card, RN Outcome: Progressing 06/26/2023 1819 by Stormy Card, RN Outcome:  Progressing   Problem: Tissue Perfusion: Goal: Adequacy of tissue perfusion will improve 06/26/2023 1822 by Stormy Card, RN Outcome: Progressing 06/26/2023 1819 by Stormy Card, RN Outcome: Progressing   Problem: Education: Goal: Knowledge of General Education information will improve Description: Including pain rating scale, medication(s)/side effects and non-pharmacologic comfort measures 06/26/2023 1822 by Stormy Card, RN Outcome: Progressing 06/26/2023 1819 by Stormy Card, RN Outcome: Progressing   Problem: Health Behavior/Discharge Planning: Goal: Ability to manage health-related needs will improve 06/26/2023 1822 by Stormy Card, RN Outcome: Progressing 06/26/2023 1819 by Stormy Card, RN Outcome: Progressing   Problem: Clinical Measurements: Goal: Ability to maintain clinical measurements within normal limits will improve 06/26/2023 1822 by Stormy Card, RN Outcome: Progressing 06/26/2023 1819 by Stormy Card, RN Outcome: Progressing Goal: Will remain free from infection 06/26/2023 1822 by Stormy Card, RN Outcome: Progressing 06/26/2023 1819 by Stormy Card, RN Outcome: Progressing Goal: Cardiovascular complication will be avoided 06/26/2023 1822 by Stormy Card, RN Outcome: Progressing 06/26/2023 1819 by Stormy Card, RN Outcome: Progressing   Problem: Activity: Goal: Risk for activity intolerance will decrease 06/26/2023 1822 by Stormy Card, RN Outcome: Progressing 06/26/2023 1819 by Stormy Card, RN Outcome: Progressing   Problem: Nutrition: Goal: Adequate nutrition will be maintained 06/26/2023 1822 by Stormy Card, RN Outcome: Progressing 06/26/2023 1819 by Stormy Card, RN Outcome: Progressing   Problem: Coping: Goal: Level of anxiety will decrease 06/26/2023 1822 by Stormy Card, RN Outcome: Progressing 06/26/2023 1819 by Stormy Card, RN Outcome: Progressing   Problem: Elimination: Goal: Will not  experience complications related to bowel motility 06/26/2023 1822 by Stormy Card, RN Outcome: Progressing 06/26/2023 1819 by Stormy Card, RN Outcome: Progressing Goal: Will not experience complications related to urinary retention 06/26/2023 1822  by Stormy Card, RN Outcome: Progressing 06/26/2023 1819 by Stormy Card, RN Outcome: Progressing   Problem: Pain Managment: Goal: General experience of comfort will improve Outcome: Progressing   Problem: Safety: Goal: Ability to remain free from injury will improve Outcome: Progressing   Problem: Skin Integrity: Goal: Risk for impaired skin integrity will decrease Outcome: Progressing   Problem: Education: Goal: Knowledge of disease or condition will improve Outcome: Progressing Goal: Knowledge of secondary prevention will improve (MUST DOCUMENT ALL) Outcome: Progressing Goal: Knowledge of patient specific risk factors will improve Loraine Leriche N/A or DELETE if not current risk factor) Outcome: Progressing   Problem: Ischemic Stroke/TIA Tissue Perfusion: Goal: Complications of ischemic stroke/TIA will be minimized Outcome: Progressing   Problem: Coping: Goal: Will verbalize positive feelings about self Outcome: Progressing Goal: Will identify appropriate support needs Outcome: Progressing   Problem: Health Behavior/Discharge Planning: Goal: Ability to manage health-related needs will improve Outcome: Progressing Goal: Goals will be collaboratively established with patient/family Outcome: Progressing   Problem: Self-Care: Goal: Ability to participate in self-care as condition permits will improve Outcome: Progressing Goal: Verbalization of feelings and concerns over difficulty with self-care will improve Outcome: Progressing Goal: Ability to communicate needs accurately will improve Outcome: Progressing   Problem: Nutrition: Goal: Risk of aspiration will decrease Outcome: Progressing Goal: Dietary intake will  improve Outcome: Progressing   Problem: Education: Goal: Knowledge of disease or condition will improve Outcome: Progressing Goal: Knowledge of secondary prevention will improve (MUST DOCUMENT ALL) Outcome: Progressing Goal: Knowledge of patient specific risk factors will improve Loraine Leriche N/A or DELETE if not current risk factor) Outcome: Progressing   Problem: Ischemic Stroke/TIA Tissue Perfusion: Goal: Complications of ischemic stroke/TIA will be minimized Outcome: Progressing   Problem: Coping: Goal: Will verbalize positive feelings about self Outcome: Progressing Goal: Will identify appropriate support needs Outcome: Progressing   Problem: Health Behavior/Discharge Planning: Goal: Ability to manage health-related needs will improve Outcome: Progressing Goal: Goals will be collaboratively established with patient/family Outcome: Progressing   Problem: Self-Care: Goal: Ability to participate in self-care as condition permits will improve Outcome: Progressing Goal: Verbalization of feelings and concerns over difficulty with self-care will improve Outcome: Progressing Goal: Ability to communicate needs accurately will improve Outcome: Progressing   Problem: Education: Goal: Knowledge of the prescribed therapeutic regimen will improve Outcome: Progressing Goal: Ability to verbalize activity precautions or restrictions will improve Outcome: Progressing Goal: Understanding of discharge needs will improve Outcome: Progressing   Problem: Self-Care: Goal: Ability to meet self-care needs will improve Outcome: Progressing   Problem: Self-Concept: Goal: Ability to maintain and perform role responsibilities to the fullest extent possible will improve Outcome: Progressing   Problem: Pain Management: Goal: Pain level will decrease with appropriate interventions Outcome: Progressing   Problem: Skin Integrity: Goal: Demonstration of wound healing without infection will  improve Outcome: Progressing

## 2023-06-26 NOTE — Progress Notes (Signed)
[name].  WUJ:[id number] DOB: [birth year] DOA: 06/06/2023 PCP: [name], MD    Brief Narrative:  72 year old with a history of kidney/pancreas transplant, HTN, DM2, left BKA, and chronic right foot wound who presented to the ER 06/06/2023 with chills at which time an MRI of the foot revealed osteomyelitis.  Following his admission a full evaluation directed by orthopedics and vascular surgery revealed poor runoff to the right foot and the patient ultimately underwent a BKA.  His hospitalization has been further complicated by the acute onset of right sided blurry vision leading to a workup which revealed an acute stroke.  Goals of Care:   Code Status: Full Code   DVT prophylaxis: SCD's Start: 06/18/23 1804 enoxaparin (LOVENOX) injection 40 mg Start: 06/11/23 1000   Interim Hx: Remains medically stable for discharge to CIR.  No acute events recorded overnight.  Afebrile.  Vital signs stable. Resting comfortably at the time of my exam.   Assessment & Plan:  Acute on chronic right foot ulcer with osteomyelitis - PVD Has completed a course of broad antibiotic therapy -has been seen in consultation by Orthopedic Surgery and Vascular Surgery - ultimately underwent right sided BKA 9/20 -PT/OT recommend inpatient rehab stay  Acute CVA Diagnosed during this hospital stay -has been seen by stroke service/neurology who ultimately signed off 9/15 -recommendation was DAPT x 3 weeks with aspirin and Plavix, followed by aspirin alone thereafter  Dysphagia Workup has revealed severe dysmotility on esophagram -GI was consulted for evaluation but this had to be delayed due to the patient's acute CVA 9/14 -during CVA workup patient was cleared for regular diet by SLP -on 9/2 patient developed recurrent dysphagia with nausea and dry heaving -GI recommended discontinuation of opioids with aggressive treatment of constipation with consideration being given to EGD as an outpatient when the patient's  acute illness has fully stabilized -GI has initiated fluconazole for empiric treatment of possible esophageal candidiasis -much improved clinically following these measures with patient tolerating regular diet without difficulty  Isolated fever 9/25 Etiology unclear - CXR not convincing for a true pulmonary infiltrate, and no other sx to support PNA  - follow-up CBC revealed improving WBC count - no clinical symptoms to suggest UTI  DM2 CBG well-controlled  Status post pancreas and kidney transplant Continue usual antirejection medications to include mycophenolate and tacrolimus -no evidence of acute complications  HTN Blood pressure controlled at present  HLD Continue Lipitor  Nutrition Status: Nutrition Problem: Moderate Malnutrition Etiology: chronic illness (uncontrolled DM) Signs/Symptoms: mild fat depletion, mild muscle depletion Interventions: Ensure Enlive (each supplement provides 350kcal and 20 grams of protein), Refer to RD note for recommendations   Family Communication: No family present at time of exam today Disposition: remains medically stable for discharge - awaiting CIR admission   Objective: Blood pressure (!) 141/56, pulse 72, temperature 98.5 F (36.9 C), temperature source Oral, resp. rate 18, height 5\' 10"  (1.778 m), weight 69.8 kg, SpO2 100%.  Intake/Output Summary (Last 24 hours) at 06/26/2023 0814 Last data filed at 06/25/2023 1650 Gross per 24 hour  Intake 240 ml  Output 400 ml  Net -160 ml   Filed Weights   06/17/23 0356 06/18/23 0430 06/24/23 0432  Weight: 70.2 kg 71.1 kg 69.8 kg    Examination: General: No acute respiratory distress Lungs: Clear to auscultation bilaterally  Cardiovascular: Regular rate and rhythm without murmur    CBC: Recent Labs  Lab 06/23/23 0626 06/24/23 0500 06/25/23 0958  WBC 15.5* 13.1* 11.5*  HGB 9.3* 8.3* 8.3*  HCT 29.7* 26.2* 25.0*  MCV 80.9 79.6* 78.1*  PLT 265 266 266   Basic Metabolic  Panel: Recent Labs  Lab 06/22/23 0306 06/23/23 0626 06/24/23 0500  NA 136 137 137  K 4.2 4.3 4.0  CL 106 104 105  CO2 22 20* 21*  GLUCOSE 94 116* 116*  BUN 27* 30* 17  CREATININE 1.14 1.25* 1.10  CALCIUM 9.5 9.7 9.1  MG  --  1.6*  --    GFR: Estimated Creatinine Clearance: 61.7 mL/min (by C-G formula based on SCr of 1.1 mg/dL).   Scheduled Meds:  amLODipine  10 mg Oral Daily   vitamin C  1,000 mg Oral Daily   aspirin EC  81 mg Oral Daily   atorvastatin  80 mg Oral QHS   clopidogrel  75 mg Oral Q breakfast   enoxaparin (LOVENOX) injection  40 mg Subcutaneous Q24H   feeding supplement  237 mL Oral BID BM   fluconazole  200 mg Oral Daily   insulin aspart  0-6 Units Subcutaneous TID WC   insulin glargine-yfgn  7 Units Subcutaneous Daily   leptospermum manuka honey  1 Application Topical Daily   losartan  50 mg Oral Daily   metoCLOPramide  5 mg Oral TID AC   mycophenolate  1,000 mg Oral BID   nutrition supplement (JUVEN)  1 packet Oral BID BM   pantoprazole  40 mg Oral BID   polyethylene glycol  17 g Oral BID   potassium chloride SA  20 mEq Oral Daily   senna-docusate  1 tablet Oral BID   sodium chloride flush  3 mL Intravenous Q12H   tacrolimus  5 mg Oral BID   Vitamin D (Ergocalciferol)  50,000 Units Oral Q Wed   zinc sulfate  220 mg Oral Daily     LOS: 20 days   Lonia Blood, MD [facility] Office  [phone] Pager - Text Page per Loretha Stapler  If 7PM-7AM, please contact night-coverage per Amion 06/26/2023, 8:14 AM

## 2023-06-27 DIAGNOSIS — M869 Osteomyelitis, unspecified: Secondary | ICD-10-CM | POA: Diagnosis not present

## 2023-06-27 LAB — GLUCOSE, CAPILLARY
Glucose-Capillary: 134 mg/dL — ABNORMAL HIGH (ref 70–99)
Glucose-Capillary: 150 mg/dL — ABNORMAL HIGH (ref 70–99)
Glucose-Capillary: 265 mg/dL — ABNORMAL HIGH (ref 70–99)

## 2023-06-27 NOTE — Progress Notes (Signed)
[name].  WNU:[id number] DOB: 20-May-1951 DOA: 06/06/2023 PCP: [name], MD    Brief Narrative:  72 year old with a history of kidney/pancreas transplant, HTN, DM2, L BKA, and chronic right foot wound who presented to the ER 06/06/2023 with chills at which time an MRI of the foot revealed osteomyelitis.  Following his admission a full evaluation directed by orthopedics and vascular surgery revealed poor runoff to the right foot and the patient ultimately underwent a BKA.  His hospitalization has been further complicated by the acute onset of right sided blurry vision leading to a workup which revealed an acute stroke.  Goals of Care:   Code Status: Full Code   DVT prophylaxis: SCD's Start: 06/18/23 1804 enoxaparin (LOVENOX) injection 40 mg Start: 06/11/23 1000   Interim Hx: No acute events recorded since the time of my last visit.  Afebrile.  Vital signs stable.  CBG controlled at 99-170.  Resting comfortably in bed with no new complaints.  Assessment & Plan:  Acute on chronic right foot ulcer with osteomyelitis - PVD Has completed a course of broad antibiotic therapy - was seen in consultation by Orthopedic Surgery and Vascular Surgery - ultimately underwent R BKA 9/20 - PT/OT recommend inpatient rehab stay - awaiting bed in CIR - medically cleared for d/c   Acute CVA Diagnosed during this hospital stay - has been seen by Stroke Service/Neurology who ultimately signed off 9/15 - recommendation was DAPT x 3 weeks (through 07/04/23) with aspirin and Plavix, followed by aspirin alone thereafter  Dysphagia Workup initially revealed severe dysmotility on esophagram - GI consultation had to be delayed due to the patient's acute CVA 9/14 - during CVA workup patient was cleared for regular diet by SLP - GI recommended discontinuation of opioids with aggressive treatment of constipation with consideration being given to EGD as an outpatient when the patient's acute illness has fully  stabilized - GI initiated fluconazole for empiric treatment of possible esophageal candidiasis - much improved clinically following these measures with patient tolerating regular diet without difficulty  Isolated fever 9/25 Etiology unclear - CXR not convincing for a true pulmonary infiltrate, and no other sx to support PNA  - follow-up CBC revealed improving WBC count - no clinical symptoms to suggest UTI  DM2 CBG well-controlled  Status post pancreas and kidney transplant Continued usual antirejection medications to include mycophenolate and tacrolimus - no evidence of acute complications  HTN Blood pressure controlled at present  HLD Continue Lipitor, with dose increased to 80 mg during this hospital stay due to CVA  Nutrition Status: Nutrition Problem: Moderate Malnutrition Etiology: chronic illness (uncontrolled DM) Signs/Symptoms: mild fat depletion, mild muscle depletion Interventions: Ensure Enlive (each supplement provides 350kcal and 20 grams of protein), Refer to RD note for recommendations   Family Communication: No family present at time of exam today Disposition: remains medically stable for discharge - awaiting CIR admission   Objective: Blood pressure (!) 114/42, pulse 85, temperature 98.8 F (37.1 C), temperature source Oral, resp. rate 17, height 5\' 10"  (1.778 m), weight 69.8 kg, SpO2 96%.  Intake/Output Summary (Last 24 hours) at 06/27/2023 0759 Last data filed at 06/26/2023 1907 Gross per 24 hour  Intake --  Output 900 ml  Net -900 ml   Filed Weights   06/17/23 0356 06/18/23 0430 06/24/23 0432  Weight: 70.2 kg 71.1 kg 69.8 kg    Examination: General: No acute respiratory distress Lungs: Clear to auscultation bilaterally  Cardiovascular: Regular rate and rhythm without murmur  Abdom: NT/ND, soft, BS+, no mass Ext: no cyanosis or signif edema of extremities    CBC: Recent Labs  Lab 06/23/23 0626 06/24/23 0500 06/25/23 0958  WBC 15.5* 13.1*  11.5*  HGB 9.3* 8.3* 8.3*  HCT 29.7* 26.2* 25.0*  MCV 80.9 79.6* 78.1*  PLT 265 266 266   Basic Metabolic Panel: Recent Labs  Lab 06/22/23 0306 06/23/23 0626 06/24/23 0500  NA 136 137 137  K 4.2 4.3 4.0  CL 106 104 105  CO2 22 20* 21*  GLUCOSE 94 116* 116*  BUN 27* 30* 17  CREATININE 1.14 1.25* 1.10  CALCIUM 9.5 9.7 9.1  MG  --  1.6*  --    GFR: Estimated Creatinine Clearance: 61.7 mL/min (by C-G formula based on SCr of 1.1 mg/dL).   Scheduled Meds:  amLODipine  10 mg Oral Daily   vitamin C  1,000 mg Oral Daily   aspirin EC  81 mg Oral Daily   atorvastatin  80 mg Oral QHS   clopidogrel  75 mg Oral Q breakfast   enoxaparin (LOVENOX) injection  40 mg Subcutaneous Q24H   feeding supplement  237 mL Oral BID BM   fluconazole  200 mg Oral Daily   insulin aspart  0-6 Units Subcutaneous TID WC   insulin glargine-yfgn  7 Units Subcutaneous Daily   leptospermum manuka honey  1 Application Topical Daily   losartan  50 mg Oral Daily   metoCLOPramide  5 mg Oral TID AC   mycophenolate  1,000 mg Oral BID   nutrition supplement (JUVEN)  1 packet Oral BID BM   pantoprazole  40 mg Oral BID   polyethylene glycol  17 g Oral BID   potassium chloride SA  20 mEq Oral Daily   senna-docusate  1 tablet Oral BID   sodium chloride flush  3 mL Intravenous Q12H   tacrolimus  5 mg Oral BID   Vitamin D (Ergocalciferol)  50,000 Units Oral Q Wed   zinc sulfate  220 mg Oral Daily     LOS: 21 days   Lonia Blood, MD [facility] Office  [phone] Pager - Text Page per Loretha Stapler  If 7PM-7AM, please contact night-coverage per Amion 06/27/2023, 7:59 AM

## 2023-06-28 DIAGNOSIS — M869 Osteomyelitis, unspecified: Secondary | ICD-10-CM | POA: Diagnosis not present

## 2023-06-28 LAB — BASIC METABOLIC PANEL
Anion gap: 14 (ref 5–15)
BUN: 15 mg/dL (ref 8–23)
CO2: 23 mmol/L (ref 22–32)
Calcium: 9.4 mg/dL (ref 8.9–10.3)
Chloride: 98 mmol/L (ref 98–111)
Creatinine, Ser: 1.17 mg/dL (ref 0.61–1.24)
GFR, Estimated: >60 mL/min (ref >60)
Glucose, Bld: 114 mg/dL — ABNORMAL HIGH (ref 70–99)
Potassium: 4.8 mmol/L (ref 3.5–5.1)
Sodium: 135 mmol/L (ref 135–145)

## 2023-06-28 LAB — GLUCOSE, CAPILLARY
Glucose-Capillary: 107 mg/dL — ABNORMAL HIGH (ref 70–99)
Glucose-Capillary: 167 mg/dL — ABNORMAL HIGH (ref 70–99)
Glucose-Capillary: 229 mg/dL — ABNORMAL HIGH (ref 70–99)

## 2023-06-28 NOTE — Plan of Care (Signed)
Problem: Education: Goal: Ability to describe self-care measures that may prevent or decrease complications (Diabetes Survival Skills Education) will improve Outcome: Progressing   Problem: Coping: Goal: Ability to adjust to condition or change in health will improve Outcome: Progressing   Problem: Fluid Volume: Goal: Ability to maintain a balanced intake and output will improve Outcome: Progressing   Problem: Health Behavior/Discharge Planning: Goal: Ability to identify and utilize available resources and services will improve Outcome: Progressing Goal: Ability to manage health-related needs will improve Outcome: Progressing   Problem: Metabolic: Goal: Ability to maintain appropriate glucose levels will improve Outcome: Progressing   Problem: Nutritional: Goal: Maintenance of adequate nutrition will improve Outcome: Progressing Goal: Progress toward achieving an optimal weight will improve Outcome: Progressing   Problem: Skin Integrity: Goal: Risk for impaired skin integrity will decrease Outcome: Progressing   Problem: Tissue Perfusion: Goal: Adequacy of tissue perfusion will improve Outcome: Progressing   Problem: Education: Goal: Knowledge of General Education information will improve Description: Including pain rating scale, medication(s)/side effects and non-pharmacologic comfort measures Outcome: Progressing   Problem: Health Behavior/Discharge Planning: Goal: Ability to manage health-related needs will improve Outcome: Progressing   Problem: Clinical Measurements: Goal: Ability to maintain clinical measurements within normal limits will improve Outcome: Progressing Goal: Will remain free from infection Outcome: Progressing Goal: Cardiovascular complication will be avoided Outcome: Progressing   Problem: Activity: Goal: Risk for activity intolerance will decrease Outcome: Progressing   Problem: Nutrition: Goal: Adequate nutrition will be  maintained Outcome: Progressing   Problem: Coping: Goal: Level of anxiety will decrease Outcome: Progressing   Problem: Elimination: Goal: Will not experience complications related to bowel motility Outcome: Progressing Goal: Will not experience complications related to urinary retention Outcome: Progressing   Problem: Pain Managment: Goal: General experience of comfort will improve Outcome: Progressing   Problem: Safety: Goal: Ability to remain free from injury will improve Outcome: Progressing   Problem: Skin Integrity: Goal: Risk for impaired skin integrity will decrease Outcome: Progressing   Problem: Education: Goal: Knowledge of disease or condition will improve Outcome: Progressing Goal: Knowledge of secondary prevention will improve (MUST DOCUMENT ALL) Outcome: Progressing Goal: Knowledge of patient specific risk factors will improve Loraine Leriche N/A or DELETE if not current risk factor) Outcome: Progressing   Problem: Ischemic Stroke/TIA Tissue Perfusion: Goal: Complications of ischemic stroke/TIA will be minimized Outcome: Progressing   Problem: Coping: Goal: Will verbalize positive feelings about self Outcome: Progressing Goal: Will identify appropriate support needs Outcome: Progressing   Problem: Health Behavior/Discharge Planning: Goal: Ability to manage health-related needs will improve Outcome: Progressing Goal: Goals will be collaboratively established with patient/family Outcome: Progressing   Problem: Self-Care: Goal: Ability to participate in self-care as condition permits will improve Outcome: Progressing Goal: Verbalization of feelings and concerns over difficulty with self-care will improve Outcome: Progressing Goal: Ability to communicate needs accurately will improve Outcome: Progressing   Problem: Nutrition: Goal: Risk of aspiration will decrease Outcome: Progressing Goal: Dietary intake will improve Outcome: Progressing   Problem:  Education: Goal: Knowledge of disease or condition will improve Outcome: Progressing Goal: Knowledge of secondary prevention will improve (MUST DOCUMENT ALL) Outcome: Progressing Goal: Knowledge of patient specific risk factors will improve Loraine Leriche N/A or DELETE if not current risk factor) Outcome: Progressing   Problem: Ischemic Stroke/TIA Tissue Perfusion: Goal: Complications of ischemic stroke/TIA will be minimized Outcome: Progressing   Problem: Coping: Goal: Will verbalize positive feelings about self Outcome: Progressing Goal: Will identify appropriate support needs Outcome: Progressing   Problem: Health Behavior/Discharge Planning:  Goal: Ability to manage health-related needs will improve Outcome: Progressing Goal: Goals will be collaboratively established with patient/family Outcome: Progressing   Problem: Self-Care: Goal: Ability to participate in self-care as condition permits will improve Outcome: Progressing Goal: Verbalization of feelings and concerns over difficulty with self-care will improve Outcome: Progressing Goal: Ability to communicate needs accurately will improve Outcome: Progressing

## 2023-06-28 NOTE — Progress Notes (Signed)
[name].  BJY:[id number] DOB: [birth year] DOA: 06/06/2023 PCP: [name], MD    Brief Narrative:  72 year old with a history of kidney/pancreas transplant, HTN, DM2, L BKA, and chronic right foot wound who presented to the ER 06/06/2023 with chills at which time an MRI of the foot revealed osteomyelitis.  Following his admission a full evaluation directed by orthopedics and vascular surgery revealed poor runoff to the right foot and the patient ultimately underwent a BKA.  His hospitalization has been further complicated by the acute onset of right sided blurry vision leading to a workup which revealed an acute stroke.  Goals of Care:   Code Status: Full Code   DVT prophylaxis: SCD's Start: 06/18/23 1804 enoxaparin (LOVENOX) injection 40 mg Start: 06/11/23 1000   Interim Hx: Remains medically stable, awaiting a CIR bed.  Afebrile.  Vital signs stable.  No new complaints today.  In good spirits.  Displays a good appetite.  Assessment & Plan:  Acute on chronic right foot ulcer with osteomyelitis - PVD Has completed a course of broad antibiotic therapy - was seen in consultation by Orthopedic Surgery and Vascular Surgery - ultimately underwent R BKA 9/20 - PT/OT recommend inpatient rehab stay - awaiting bed in CIR - medically cleared for d/c   Acute CVA Diagnosed during this hospital stay - has been seen by Stroke Service/Neurology who ultimately signed off 9/15 - recommendation was DAPT x 3 weeks (through 07/04/23) with aspirin and Plavix, followed by aspirin alone thereafter  Dysphagia Workup initially revealed severe dysmotility on esophagram - GI consultation had to be delayed due to the patient's acute CVA 9/14 - during CVA workup patient was cleared for regular diet by SLP - GI recommended discontinuation of opioids with aggressive treatment of constipation with consideration being given to EGD as an outpatient when the patient's acute illness has fully stabilized - GI  initiated fluconazole for empiric treatment of possible esophageal candidiasis - much improved clinically following these measures with patient tolerating regular diet without difficulty  Isolated fever 9/25 Etiology unclear - CXR not convincing for a true pulmonary infiltrate, and no other sx to support PNA  - follow-up CBC revealed improving WBC count - no clinical symptoms to suggest UTI -has not recurred  DM2 CBG well-controlled  Status post pancreas and kidney transplant Continued usual antirejection medications to include mycophenolate and tacrolimus - no evidence of acute complications  HTN Blood pressure controlled   HLD Continue Lipitor, with dose increased to 80 mg during this hospital stay due to CVA  Nutrition Status: Nutrition Problem: Moderate Malnutrition Etiology: chronic illness (uncontrolled DM) Signs/Symptoms: mild fat depletion, mild muscle depletion Interventions: Ensure Enlive (each supplement provides 350kcal and 20 grams of protein), Refer to RD note for recommendations   Family Communication: Spoke with wife at bedside Disposition: remains medically stable for discharge - awaiting CIR admission   Objective: Blood pressure 133/61, pulse 84, temperature 99 F (37.2 C), temperature source Oral, resp. rate 16, height 5\' 10"  (1.778 m), weight 69.8 kg, SpO2 100%.  Intake/Output Summary (Last 24 hours) at 06/28/2023 0800 Last data filed at 06/28/2023 0500 Gross per 24 hour  Intake 240 ml  Output 1250 ml  Net -1010 ml   Filed Weights   06/17/23 0356 06/18/23 0430 06/24/23 0432  Weight: 70.2 kg 71.1 kg 69.8 kg    Examination: General: No acute respiratory distress Lungs: Clear to auscultation bilaterally  Cardiovascular: RRR Abdom: NT/ND, soft, BS+, no mass Ext: no cyanosis or  signif peripheral edema   CBC: Recent Labs  Lab 06/23/23 0626 06/24/23 0500 06/25/23 0958  WBC 15.5* 13.1* 11.5*  HGB 9.3* 8.3* 8.3*  HCT 29.7* 26.2* 25.0*  MCV 80.9  79.6* 78.1*  PLT 265 266 266   Basic Metabolic Panel: Recent Labs  Lab 06/23/23 0626 06/24/23 0500 06/28/23 0334  NA 137 137 135  K 4.3 4.0 4.8  CL 104 105 98  CO2 20* 21* 23  GLUCOSE 116* 116* 114*  BUN 30* 17 15  CREATININE 1.25* 1.10 1.17  CALCIUM 9.7 9.1 9.4  MG 1.6*  --   --    GFR: Estimated Creatinine Clearance: 58 mL/min (by C-G formula based on SCr of 1.17 mg/dL).   Scheduled Meds:  amLODipine  10 mg Oral Daily   vitamin C  1,000 mg Oral Daily   aspirin EC  81 mg Oral Daily   atorvastatin  80 mg Oral QHS   clopidogrel  75 mg Oral Q breakfast   enoxaparin (LOVENOX) injection  40 mg Subcutaneous Q24H   feeding supplement  237 mL Oral BID BM   fluconazole  200 mg Oral Daily   insulin aspart  0-6 Units Subcutaneous TID WC   insulin glargine-yfgn  7 Units Subcutaneous Daily   losartan  50 mg Oral Daily   metoCLOPramide  5 mg Oral TID AC   mycophenolate  1,000 mg Oral BID   nutrition supplement (JUVEN)  1 packet Oral BID BM   pantoprazole  40 mg Oral BID   polyethylene glycol  17 g Oral BID   potassium chloride SA  20 mEq Oral Daily   senna-docusate  1 tablet Oral BID   sodium chloride flush  3 mL Intravenous Q12H   tacrolimus  5 mg Oral BID   Vitamin D (Ergocalciferol)  50,000 Units Oral Q Wed   zinc sulfate  220 mg Oral Daily     LOS: 22 days   Lonia Blood, MD [facility] Office  [phone] Pager - Text Page per Amion  If 7PM-7AM, please contact night-coverage per Amion 06/28/2023, 8:00 AM

## 2023-06-28 NOTE — Progress Notes (Signed)
Inpatient Rehab Admissions Coordinator:    I continue to await insurance auth for CIR. I am hopeful for a decision today.   Megan Salon, MS, CCC-SLP Rehab Admissions Coordinator  [phone] (celll) [phone] (office)

## 2023-06-28 NOTE — Progress Notes (Signed)
PT Cancellation Note  Patient Details Name: [name]. MRN: [id number] DOB: 17-Sep-1951   Cancelled Treatment:    Reason Eval/Treat Not Completed: (P) Patient declined, no reason specified,pt declining stating he did not sleep well last night and requesting to rest. Will check back as schedule allows to continue with PT POC.  [name]. PTA Acute Rehabilitation Services Office: [phone]    Catalina Antigua 06/28/2023, 3:27 PM

## 2023-06-29 ENCOUNTER — Inpatient Hospital Stay (HOSPITAL_COMMUNITY)
Admission: AD | Admit: 2023-06-29 | Discharge: 2023-07-08 | DRG: 057 | Disposition: A | Discharge status: Home Health | Payer: Medicare HMO | Source: Intra-hospital | Attending: Physical Medicine and Rehabilitation | Admitting: Physical Medicine and Rehabilitation

## 2023-06-29 ENCOUNTER — Encounter (HOSPITAL_COMMUNITY): Payer: Self-pay | Admitting: Physical Medicine and Rehabilitation

## 2023-06-29 ENCOUNTER — Other Ambulatory Visit: Payer: Self-pay

## 2023-06-29 DIAGNOSIS — I959 Hypotension, unspecified: Secondary | ICD-10-CM | POA: Diagnosis not present

## 2023-06-29 DIAGNOSIS — I639 Cerebral infarction, unspecified: Secondary | ICD-10-CM | POA: Diagnosis not present

## 2023-06-29 DIAGNOSIS — Z89512 Acquired absence of left leg below knee: Secondary | ICD-10-CM

## 2023-06-29 DIAGNOSIS — G546 Phantom limb syndrome with pain: Secondary | ICD-10-CM | POA: Diagnosis not present

## 2023-06-29 DIAGNOSIS — E785 Hyperlipidemia, unspecified: Secondary | ICD-10-CM | POA: Diagnosis present

## 2023-06-29 DIAGNOSIS — H548 Legal blindness, as defined in USA: Secondary | ICD-10-CM | POA: Diagnosis present

## 2023-06-29 DIAGNOSIS — R41842 Visuospatial deficit: Secondary | ICD-10-CM | POA: Diagnosis present

## 2023-06-29 DIAGNOSIS — Z8616 Personal history of COVID-19: Secondary | ICD-10-CM

## 2023-06-29 DIAGNOSIS — R609 Edema, unspecified: Secondary | ICD-10-CM | POA: Diagnosis not present

## 2023-06-29 DIAGNOSIS — I69398 Other sequelae of cerebral infarction: Principal | ICD-10-CM

## 2023-06-29 DIAGNOSIS — F4310 Post-traumatic stress disorder, unspecified: Secondary | ICD-10-CM | POA: Diagnosis present

## 2023-06-29 DIAGNOSIS — Z89511 Acquired absence of right leg below knee: Secondary | ICD-10-CM | POA: Diagnosis not present

## 2023-06-29 DIAGNOSIS — Z79624 Long term (current) use of inhibitors of nucleotide synthesis: Secondary | ICD-10-CM

## 2023-06-29 DIAGNOSIS — Z7984 Long term (current) use of oral hypoglycemic drugs: Secondary | ICD-10-CM | POA: Diagnosis not present

## 2023-06-29 DIAGNOSIS — I6381 Other cerebral infarction due to occlusion or stenosis of small artery: Secondary | ICD-10-CM | POA: Diagnosis not present

## 2023-06-29 DIAGNOSIS — I739 Peripheral vascular disease, unspecified: Secondary | ICD-10-CM | POA: Diagnosis not present

## 2023-06-29 DIAGNOSIS — E1165 Type 2 diabetes mellitus with hyperglycemia: Secondary | ICD-10-CM | POA: Diagnosis not present

## 2023-06-29 DIAGNOSIS — E1151 Type 2 diabetes mellitus with diabetic peripheral angiopathy without gangrene: Secondary | ICD-10-CM | POA: Diagnosis not present

## 2023-06-29 DIAGNOSIS — Z4781 Encounter for orthopedic aftercare following surgical amputation: Secondary | ICD-10-CM | POA: Diagnosis not present

## 2023-06-29 DIAGNOSIS — I82512 Chronic embolism and thrombosis of left femoral vein: Secondary | ICD-10-CM | POA: Diagnosis present

## 2023-06-29 DIAGNOSIS — G4733 Obstructive sleep apnea (adult) (pediatric): Secondary | ICD-10-CM | POA: Diagnosis present

## 2023-06-29 DIAGNOSIS — Z79621 Long term (current) use of calcineurin inhibitor: Secondary | ICD-10-CM

## 2023-06-29 DIAGNOSIS — E8809 Other disorders of plasma-protein metabolism, not elsewhere classified: Secondary | ICD-10-CM | POA: Diagnosis present

## 2023-06-29 DIAGNOSIS — Z7982 Long term (current) use of aspirin: Secondary | ICD-10-CM

## 2023-06-29 DIAGNOSIS — Z79899 Other long term (current) drug therapy: Secondary | ICD-10-CM

## 2023-06-29 DIAGNOSIS — E875 Hyperkalemia: Secondary | ICD-10-CM | POA: Diagnosis present

## 2023-06-29 DIAGNOSIS — G4700 Insomnia, unspecified: Secondary | ICD-10-CM | POA: Diagnosis present

## 2023-06-29 DIAGNOSIS — Z8249 Family history of ischemic heart disease and other diseases of the circulatory system: Secondary | ICD-10-CM

## 2023-06-29 DIAGNOSIS — I10 Essential (primary) hypertension: Secondary | ICD-10-CM | POA: Diagnosis present

## 2023-06-29 DIAGNOSIS — R1319 Other dysphagia: Secondary | ICD-10-CM

## 2023-06-29 DIAGNOSIS — Z794 Long term (current) use of insulin: Secondary | ICD-10-CM

## 2023-06-29 DIAGNOSIS — R1310 Dysphagia, unspecified: Secondary | ICD-10-CM | POA: Diagnosis present

## 2023-06-29 DIAGNOSIS — M869 Osteomyelitis, unspecified: Secondary | ICD-10-CM | POA: Diagnosis not present

## 2023-06-29 DIAGNOSIS — Z9989 Dependence on other enabling machines and devices: Secondary | ICD-10-CM

## 2023-06-29 DIAGNOSIS — Z87891 Personal history of nicotine dependence: Secondary | ICD-10-CM | POA: Diagnosis not present

## 2023-06-29 DIAGNOSIS — K219 Gastro-esophageal reflux disease without esophagitis: Secondary | ICD-10-CM | POA: Diagnosis present

## 2023-06-29 LAB — GLUCOSE, CAPILLARY
Glucose-Capillary: 164 mg/dL — ABNORMAL HIGH (ref 70–99)
Glucose-Capillary: 195 mg/dL — ABNORMAL HIGH (ref 70–99)
Glucose-Capillary: 204 mg/dL — ABNORMAL HIGH (ref 70–99)
Glucose-Capillary: 207 mg/dL — ABNORMAL HIGH (ref 70–99)
Glucose-Capillary: 226 mg/dL — ABNORMAL HIGH (ref 70–99)

## 2023-06-29 MED ORDER — ZINC SULFATE 220 (50 ZN) MG PO CAPS
220.0000 mg | ORAL_CAPSULE | Freq: Every day | ORAL | Status: AC
  Administered 2023-06-30 – 2023-07-01 (×2): 220 mg via ORAL
  Filled 2023-06-29 (×2): qty 1

## 2023-06-29 MED ORDER — INSULIN GLARGINE-YFGN 100 UNIT/ML ~~LOC~~ SOLN
7.0000 [IU] | Freq: Every day | SUBCUTANEOUS | Status: DC
  Filled 2023-06-29: qty 0.07

## 2023-06-29 MED ORDER — OXYCODONE HCL 5 MG PO TABS
5.0000 mg | ORAL_TABLET | ORAL | Status: DC | PRN
  Administered 2023-06-29 – 2023-07-07 (×15): 5 mg via ORAL
  Filled 2023-06-29 (×16): qty 1

## 2023-06-29 MED ORDER — GABAPENTIN 100 MG PO CAPS
100.0000 mg | ORAL_CAPSULE | Freq: Three times a day (TID) | ORAL | Status: DC
  Administered 2023-06-29 – 2023-07-06 (×20): 100 mg via ORAL
  Filled 2023-06-29 (×20): qty 1

## 2023-06-29 MED ORDER — SENNOSIDES-DOCUSATE SODIUM 8.6-50 MG PO TABS
1.0000 | ORAL_TABLET | Freq: Two times a day (BID) | ORAL | Status: DC
  Administered 2023-06-29 – 2023-07-08 (×14): 1 via ORAL
  Filled 2023-06-29 (×18): qty 1

## 2023-06-29 MED ORDER — ENOXAPARIN SODIUM 40 MG/0.4ML IJ SOSY
40.0000 mg | PREFILLED_SYRINGE | INTRAMUSCULAR | Status: DC
  Administered 2023-06-30 – 2023-07-08 (×9): 40 mg via SUBCUTANEOUS
  Filled 2023-06-29 (×10): qty 0.4

## 2023-06-29 MED ORDER — SORBITOL 70 % SOLN
30.0000 mL | Freq: Every day | Status: DC | PRN
  Filled 2023-06-29: qty 30

## 2023-06-29 MED ORDER — METOCLOPRAMIDE HCL 5 MG PO TABS
5.0000 mg | ORAL_TABLET | Freq: Three times a day (TID) | ORAL | Status: DC
  Administered 2023-06-29 – 2023-07-08 (×26): 5 mg via ORAL
  Filled 2023-06-29 (×26): qty 1

## 2023-06-29 MED ORDER — MYCOPHENOLATE MOFETIL 250 MG PO CAPS
1000.0000 mg | ORAL_CAPSULE | Freq: Two times a day (BID) | ORAL | Status: DC
  Administered 2023-06-29 – 2023-07-08 (×18): 1000 mg via ORAL
  Filled 2023-06-29 (×18): qty 4

## 2023-06-29 MED ORDER — INSULIN ASPART 100 UNIT/ML IJ SOLN
0.0000 [IU] | Freq: Three times a day (TID) | INTRAMUSCULAR | Status: DC
  Administered 2023-06-29 – 2023-06-30 (×2): 2 [IU] via SUBCUTANEOUS
  Administered 2023-06-30: 3 [IU] via SUBCUTANEOUS
  Administered 2023-06-30 – 2023-07-04 (×12): 1 [IU] via SUBCUTANEOUS

## 2023-06-29 MED ORDER — ACETAMINOPHEN 325 MG PO TABS
325.0000 mg | ORAL_TABLET | ORAL | Status: DC | PRN
  Administered 2023-07-01: 325 mg via ORAL
  Administered 2023-07-02 – 2023-07-06 (×3): 650 mg via ORAL
  Filled 2023-06-29 (×5): qty 2

## 2023-06-29 MED ORDER — ASPIRIN 81 MG PO TBEC
81.0000 mg | DELAYED_RELEASE_TABLET | Freq: Every day | ORAL | Status: DC
  Administered 2023-06-30 – 2023-07-08 (×9): 81 mg via ORAL
  Filled 2023-06-29 (×9): qty 1

## 2023-06-29 MED ORDER — FLEET ENEMA RE ENEM: 1.0000 | ENEMA | Freq: Once | RECTAL | Status: DC | PRN

## 2023-06-29 MED ORDER — LOSARTAN POTASSIUM 50 MG PO TABS
50.0000 mg | ORAL_TABLET | Freq: Every day | ORAL | Status: DC
  Administered 2023-06-30: 50 mg via ORAL
  Filled 2023-06-29: qty 1

## 2023-06-29 MED ORDER — GUAIFENESIN-DM 100-10 MG/5ML PO SYRP: 10.0000 mL | ORAL_SOLUTION | Freq: Four times a day (QID) | ORAL | Status: DC | PRN

## 2023-06-29 MED ORDER — VITAMIN C 500 MG PO TABS
1000.0000 mg | ORAL_TABLET | Freq: Every day | ORAL | Status: DC
  Administered 2023-06-30 – 2023-07-08 (×9): 1000 mg via ORAL
  Filled 2023-06-29 (×9): qty 2

## 2023-06-29 MED ORDER — FLUCONAZOLE 100 MG PO TABS
200.0000 mg | ORAL_TABLET | Freq: Every day | ORAL | Status: AC
  Administered 2023-06-30 – 2023-07-03 (×4): 200 mg via ORAL
  Filled 2023-06-29 (×4): qty 2

## 2023-06-29 MED ORDER — TRAZODONE HCL 50 MG PO TABS
50.0000 mg | ORAL_TABLET | Freq: Every evening | ORAL | Status: DC | PRN
  Administered 2023-06-30 – 2023-07-06 (×3): 50 mg via ORAL
  Filled 2023-06-29 (×3): qty 1

## 2023-06-29 MED ORDER — CLOPIDOGREL BISULFATE 75 MG PO TABS
75.0000 mg | ORAL_TABLET | Freq: Every day | ORAL | Status: AC
  Administered 2023-06-30 – 2023-07-06 (×7): 75 mg via ORAL
  Filled 2023-06-29 (×7): qty 1

## 2023-06-29 MED ORDER — PANTOPRAZOLE SODIUM 40 MG PO TBEC
40.0000 mg | DELAYED_RELEASE_TABLET | Freq: Two times a day (BID) | ORAL | Status: DC
  Administered 2023-06-29 – 2023-07-08 (×18): 40 mg via ORAL
  Filled 2023-06-29 (×18): qty 1

## 2023-06-29 MED ORDER — ATORVASTATIN CALCIUM 80 MG PO TABS
80.0000 mg | ORAL_TABLET | Freq: Every day | ORAL | Status: DC
  Administered 2023-06-29 – 2023-07-07 (×9): 80 mg via ORAL
  Filled 2023-06-29 (×9): qty 1

## 2023-06-29 MED ORDER — JUVEN PO PACK
1.0000 | PACK | Freq: Two times a day (BID) | ORAL | Status: DC
  Administered 2023-06-29 – 2023-07-06 (×9): 1 via ORAL
  Filled 2023-06-29 (×10): qty 1

## 2023-06-29 MED ORDER — ENOXAPARIN SODIUM 40 MG/0.4ML IJ SOSY: 40.0000 mg | PREFILLED_SYRINGE | INTRAMUSCULAR | Status: DC

## 2023-06-29 MED ORDER — AMLODIPINE BESYLATE 10 MG PO TABS
10.0000 mg | ORAL_TABLET | Freq: Every day | ORAL | Status: DC
  Administered 2023-06-30 – 2023-07-02 (×3): 10 mg via ORAL
  Filled 2023-06-29 (×3): qty 1

## 2023-06-29 MED ORDER — VITAMIN D (ERGOCALCIFEROL) 1.25 MG (50000 UNIT) PO CAPS
50000.0000 [IU] | ORAL_CAPSULE | ORAL | Status: DC
  Administered 2023-06-30: 50000 [IU] via ORAL
  Filled 2023-06-29: qty 1

## 2023-06-29 MED ORDER — ONDANSETRON HCL 4 MG PO TABS: 4.0000 mg | ORAL_TABLET | Freq: Four times a day (QID) | ORAL | Status: DC | PRN

## 2023-06-29 MED ORDER — POLYETHYLENE GLYCOL 3350 17 G PO PACK
17.0000 g | PACK | Freq: Two times a day (BID) | ORAL | Status: DC
  Administered 2023-06-29 – 2023-07-07 (×8): 17 g via ORAL
  Filled 2023-06-29 (×17): qty 1

## 2023-06-29 MED ORDER — ONDANSETRON HCL 4 MG/2ML IJ SOLN: 4.0000 mg | Freq: Four times a day (QID) | INTRAMUSCULAR | Status: DC | PRN

## 2023-06-29 MED ORDER — TACROLIMUS 1 MG PO CAPS
5.0000 mg | ORAL_CAPSULE | Freq: Two times a day (BID) | ORAL | Status: DC
  Administered 2023-06-29 – 2023-07-08 (×18): 5 mg via ORAL
  Filled 2023-06-29 (×18): qty 5

## 2023-06-29 MED ORDER — INSULIN GLARGINE-YFGN 100 UNIT/ML ~~LOC~~ SOLN
8.0000 [IU] | Freq: Every day | SUBCUTANEOUS | Status: DC
  Administered 2023-06-30: 8 [IU] via SUBCUTANEOUS
  Filled 2023-06-29 (×2): qty 0.08

## 2023-06-29 NOTE — TOC Progression Note (Signed)
Transition of Care (TOC) - Progression Note    Patient Details  Name: [name]. MRN: [id number] Date of Birth: 1951-08-24  Transition of Care [facility]) CM/SW Contact  Janae Bridgeman, RN Phone Number: 06/29/2023, 11:58 AM  Clinical Narrative:    CM was notified that patient was accepted to CIR and bed is available today for patient's admission.  MD was notified and bedside nursing will call report to the facility and transfer the patient to the unit once bed is ready and available.   Expected Discharge Plan: IP Rehab Facility Barriers to Discharge: Continued Medical Work up  Expected Discharge Plan and Services         Expected Discharge Date: 06/29/23                                     Social Determinants of Health (SDOH) Interventions SDOH Screenings   Food Insecurity: No Food Insecurity (06/07/2023)  Housing: Medium Risk (06/07/2023)  Transportation Needs: Unmet Transportation Needs (06/07/2023)  Utilities: Not At Risk (06/07/2023)  Depression (PHQ2-9): Low Risk  (02/02/2020)  Social Connections: Unknown (02/06/2022)   Received from [facility], [facility]  Tobacco Use: Medium Risk (06/18/2023)    Readmission Risk Interventions     No data to display

## 2023-06-29 NOTE — Progress Notes (Signed)
Inpatient Rehab Admissions Coordinator:    Pt. To admit to CIR today. RN may call report to [phone].   Pt. To transfer to CIR to  participate in intensive rehab program for 7-10 days with the goal of discharging home with assist from his wife.   Megan Salon, MS, CCC-SLP Rehab Admissions Coordinator  [phone] (celll) [phone] (office)

## 2023-06-29 NOTE — Progress Notes (Signed)
PMR Admission Coordinator Pre-Admission Assessment   Patient: [name]. is an 72 y.o., male MRN: [id number] DOB: 03-05-1951 Height: 5\' 10"  (177.8 cm) Weight: 71.1 kg                                                                                                                                                  Insurance Information HMO: yes    PPO:      PCP:      IPA:      80/20:      OTHER:  PRIMARY: [employer]      Policy#: [id number], Medicare: [id number] Subscriber: patient CM Name: [name]       Phone#: [phone] x [phone]     Fax#: [phone]   Received faxed approval on 10/1 for auth 44/0-10/2 Pre-Cert#: [id number]      Employer:  Benefits:  Phone #:      Name:  [name] Date: 12/28/2022- still active   Deductible: does not have one   OOP Max: $3,600 ($320 met)   CIR: $295/day co-pay with a max co-pay of $2,065/admission (7 days)   SNF: $20/day co-pay for days 1-20, $203/day co-pay for days 21-100, limited to 100 days/cal yr   Outpatient:  $25 copay/visit Home Health:  100% coverage   DME: 80% coverage; 20% co-insurance   Providers: In network  SECONDARY:       Policy#:       Phone#:    Artist:       Phone#:    The Data processing manager" for patients in Inpatient Rehabilitation Facilities with attached "Privacy Act Statement-Health Care Records" was provided and verbally reviewed with: Patient   Emergency Contact Information Contact Information       Name Relation Home Work Mobile    [name] Spouse [phone]             Other Contacts   None on File      Current Medical History  Patient Admitting Diagnosis: CVA; s/p R BKA History of Present Illness: Pt is a 72 year old male with medical hx significant for: pancreas and kidney transplant, DM II with retinopathy, HTN, L BKA, OSA. Pt presented to [facility] on 06/06/23 d/t generalized weakness, fatigue, tremors and chills. Pt also had wound on bottom of right  foot (been there for months). Pt met sepsis criteria. Chest x-ray negative and UA unremarkable. Empiric antibiotics started. Orthopedics consulted d/t ulceration on right foot. MRI foot showed osteomyelitis of right great toe. ABI showed PVD. Vascular Surgery consulted to optimize circulation prior to proceeding with toe amputation. Recommended angiogram. Not able to be performed d/t nausea and dysphagia. Gastroenterology consulted d/t intractable nausea and vomiting. Recommended EGD. Not able to be performed d/t elevated BP and diaphoresis. Workup after rapid response revealed small acute perforator infarct in right centrum  semiovale on 9/13. MRA negative. Carotid doppler unremarkable. Echo revealed EF 70-75%. Pt underwent abdominal aortogram, RLE angiogram, angioplasty right anterior tibial artery on 06/15/23 by Dr. Myra Gianotti. It was determined there were no revascularization options for right foot. Pt underwent right BKA on 06/18/23 by Dr. Lajoyce Corners. Therapy evaluations completed and CIR recommended d/t pt's deficits in functional mobility. Complete NIHSS TOTAL: 1 Glasgow Coma Scale Score: 15   Patient's medical record from [facility] has been reviewed by the rehabilitation admission coordinator and physician.   Past Medical History      Past Medical History:  Diagnosis Date   Anxiety     COVID-19 virus infection 09/2020   CPAP (continuous positive airway pressure) dependence     Diabetes mellitus without complication (HCC)     GERD (gastroesophageal reflux disease)     Hypertension     Legally blind in left eye, as defined in [place]     PTSD (post-traumatic stress disorder)     PVD (peripheral vascular disease) (HCC) 01/17/2015   Renal disorder     Sleep apnea     Thyroid disease            Has the patient had major surgery during 100 days prior to admission? Yes   Family History  family history includes Hypertension in an other family member.     Current Medications   Current  Medications    Current Facility-Administered Medications:    0.9 %  sodium chloride infusion, 250 mL, Intravenous, PRN, Nadara Mustard, MD   0.9 %  sodium chloride infusion, , Intravenous, Continuous, Nadara Mustard, MD   acetaminophen (TYLENOL) tablet 650 mg, 650 mg, Oral, Q6H PRN, 650 mg at 06/18/23 2054 **OR** acetaminophen (TYLENOL) suppository 650 mg, 650 mg, Rectal, Q6H PRN, Nadara Mustard, MD   alum & mag hydroxide-simeth (MAALOX/MYLANTA) 200-200-20 MG/5ML suspension 15-30 mL, 15-30 mL, Oral, Q2H PRN, Nadara Mustard, MD   amLODipine (NORVASC) tablet 5 mg, 5 mg, Oral, Daily, Nadara Mustard, MD, 5 mg at 06/19/23 1610   ascorbic acid (VITAMIN C) tablet 1,000 mg, 1,000 mg, Oral, Daily, Nadara Mustard, MD, 1,000 mg at 06/19/23 9604   aspirin EC tablet 81 mg, 81 mg, Oral, Daily, Nadara Mustard, MD, 81 mg at 06/19/23 5409   atorvastatin (LIPITOR) tablet 80 mg, 80 mg, Oral, QHS, Nadara Mustard, MD, 80 mg at 06/18/23 2054   bisacodyl (DULCOLAX) EC tablet 5 mg, 5 mg, Oral, Daily PRN, Nadara Mustard, MD   ceFEPIme (MAXIPIME) 2 g in sodium chloride 0.9 % 100 mL IVPB, 2 g, Intravenous, Q12H, Roslyn Smiling, RPH   clopidogrel (PLAVIX) tablet 75 mg, 75 mg, Oral, Q breakfast, Nadara Mustard, MD, 75 mg at 06/19/23 8119   docusate sodium (COLACE) capsule 100 mg, 100 mg, Oral, Daily, Nadara Mustard, MD, 100 mg at 06/19/23 0938   enoxaparin (LOVENOX) injection 40 mg, 40 mg, Subcutaneous, Q24H, Nadara Mustard, MD, 40 mg at 06/19/23 1478   furosemide (LASIX) tablet 20 mg, 20 mg, Oral, Daily, Nadara Mustard, MD, 20 mg at 06/19/23 0939   guaiFENesin-dextromethorphan (ROBITUSSIN DM) 100-10 MG/5ML syrup 15 mL, 15 mL, Oral, Q4H PRN, Nadara Mustard, MD   hydrALAZINE (APRESOLINE) injection 10 mg, 10 mg, Intravenous, Q4H PRN, Nadara Mustard, MD, 10 mg at 06/18/23 1816   HYDROmorphone (DILAUDID) injection 0.5-1 mg, 0.5-1 mg, Intravenous, Q4H PRN, Nadara Mustard, MD, 1 mg at 06/19/23 1223   insulin aspart (novoLOG)  injection 0-6 Units, 0-6 Units, Subcutaneous, TID WC, Nadara Mustard, MD, 1 Units at 06/19/23 1225   insulin glargine-yfgn (SEMGLEE) injection 10 Units, 10 Units, Subcutaneous, Daily, Nadara Mustard, MD, 10 Units at 06/19/23 0947   ipratropium-albuterol (DUONEB) 0.5-2.5 (3) MG/3ML nebulizer solution 3 mL, 3 mL, Nebulization, Q4H PRN, Nadara Mustard, MD   labetalol (NORMODYNE) injection 10 mg, 10 mg, Intravenous, Q10 min PRN, Nadara Mustard, MD   leptospermum manuka honey (MEDIHONEY) paste 1 Application, 1 Application, Topical, Daily, Nadara Mustard, MD, 1 Application at 06/17/23 1051   losartan (COZAAR) tablet 50 mg, 50 mg, Oral, Daily, Nadara Mustard, MD, 50 mg at 06/19/23 1610   magnesium citrate solution 1 Bottle, 1 Bottle, Oral, Once PRN, Nadara Mustard, MD   magnesium sulfate IVPB 2 g 50 mL, 2 g, Intravenous, Daily PRN, Nadara Mustard, MD   melatonin tablet 3 mg, 3 mg, Oral, QHS PRN, Nadara Mustard, MD   metoprolol tartrate (LOPRESSOR) injection 5 mg, 5 mg, Intravenous, Q4H PRN, Nadara Mustard, MD, 5 mg at 06/11/23 0932   mycophenolate (CELLCEPT) capsule 1,000 mg, 1,000 mg, Oral, BID, Nadara Mustard, MD, 1,000 mg at 06/19/23 9604   nutrition supplement (JUVEN) (JUVEN) powder packet 1 packet, 1 packet, Oral, BID BM, Nadara Mustard, MD, 1 packet at 06/19/23 0954   ondansetron [facility]) injection 4 mg, 4 mg, Intravenous, Q6H PRN, Nadara Mustard, MD, 4 mg at 06/19/23 1520   oxyCODONE (Oxy IR/ROXICODONE) immediate release tablet 10-15 mg, 10-15 mg, Oral, Q4H PRN, Nadara Mustard, MD, 15 mg at 06/19/23 1003   oxyCODONE (Oxy IR/ROXICODONE) immediate release tablet 5-10 mg, 5-10 mg, Oral, Q4H PRN, Nadara Mustard, MD, 10 mg at 06/19/23 0204   pantoprazole (PROTONIX) injection 40 mg, 40 mg, Intravenous, Q12H, Nadara Mustard, MD, 40 mg at 06/19/23 0939   phenol (CHLORASEPTIC) mouth spray 1 spray, 1 spray, Mouth/Throat, PRN, Nadara Mustard, MD   polyethylene glycol (MIRALAX / GLYCOLAX) packet 17 g, 17 g,  Oral, Daily PRN, Nadara Mustard, MD   potassium chloride SA (KLOR-CON M) CR tablet 20 mEq, 20 mEq, Oral, Daily, Nadara Mustard, MD, 20 mEq at 06/19/23 5409   potassium chloride SA (KLOR-CON M) CR tablet 20-40 mEq, 20-40 mEq, Oral, Daily PRN, Nadara Mustard, MD   sodium chloride flush (NS) 0.9 % injection 3 mL, 3 mL, Intravenous, Q12H, Nadara Mustard, MD, 3 mL at 06/18/23 2134   sodium chloride flush (NS) 0.9 % injection 3 mL, 3 mL, Intravenous, PRN, Nadara Mustard, MD   tacrolimus (PROGRAF) capsule 5 mg, 5 mg, Oral, BID, Nadara Mustard, MD, 5 mg at 06/19/23 1002   traZODone (DESYREL) tablet 50 mg, 50 mg, Oral, QHS PRN, Nadara Mustard, MD, 50 mg at 06/18/23 2054   vancomycin (VANCOREADY) IVPB 1250 mg/250 mL, 1,250 mg, Intravenous, Q24H, Nadara Mustard, MD, Last Rate: 166.7 mL/hr at 06/19/23 0958, 1,250 mg at 06/19/23 8119   Vitamin D (Ergocalciferol) (DRISDOL) 1.25 MG (50000 UNIT) capsule 50,000 Units, 50,000 Units, Oral, Q Wed, Nadara Mustard, MD, 50,000 Units at 06/16/23 1478   zinc sulfate capsule 220 mg, 220 mg, Oral, Daily, Nadara Mustard, MD, 220 mg at 06/19/23 2956     Patients Current Diet:  Diet Order                  Diet Carb Modified Fluid consistency: Thin; Room service appropriate? No  Diet effective  now                         Precautions / Restrictions Precautions Precautions: Fall, Other (comment) Precaution Comments: watch HR, vision impairment Other Brace: L prosthesis (unable to don this date, release button locked up) Restrictions Weight Bearing Restrictions: No    Has the patient had 2 or more falls or a fall with injury in the past year?No   Prior Activity Level Community (5-7x/wk): gets out of house ~3 days/week   Prior Functional Level Prior Function Prior Level of Function : Independent/Modified Independent Mobility Comments: At baseline, pt wears prosthesis every day, ambulates with SPC. Since admitted, pt has been light assist for transfers ADLs  Comments: At baseline pt is Independent with ADLs.   Self Care: Did the patient need help bathing, dressing, using the toilet or eating?  Needed some help   Indoor Mobility: Did the patient need assistance with walking from room to room (with or without device)? Independent   Stairs: Did the patient need assistance with internal or external stairs (with or without device)? Needed some help   Functional Cognition: Did the patient need help planning regular tasks such as shopping or remembering to take medications? Needed some help   Patient Information Are you of Hispanic, Latino/a,or Spanish origin?: A. No, not of Hispanic, Latino/a, or Spanish origin What is your race?: B. Black or African American Do you need or want an interpreter to communicate with a doctor or health care staff?: 0. No   Patient's Response To:  Health Literacy and Transportation Is the patient able to respond to health literacy and transportation needs?: Yes Health Literacy - How often do you need to have someone help you when you read instructions, pamphlets, or other written material from your doctor or pharmacy?: Rarely In the past 12 months, has lack of transportation kept you from medical appointments or from getting medications?: No In the past 12 months, has lack of transportation kept you from meetings, work, or from getting things needed for daily living?: No   Journalist, newspaper / Equipment Home Assistive Devices/Equipment: Medical laboratory scientific officer (specify quad or straight), CPAP, Bedside commode/3-in-1, CBG Meter, Eyeglasses, Grab bars around toilet, Grab bars in shower, Long-handled shoehorn, Scales, Shower chair with back, Environmental consultant (specify type) (straight cane,left leg prothesis) Home Equipment: Cane - single point, Shower seat, Grab bars - toilet, Grab bars - tub/shower, BSC/3in1, Standard Walker   Prior Device Use: Indicate devices/aids used by the patient prior to current illness, exacerbation or injury?  Orthotics/Prosthetics and cane   Current Functional Level Cognition   Arousal/Alertness: Awake/alert Overall Cognitive Status: No family/caregiver present to determine baseline cognitive functioning Orientation Level: Oriented X4 General Comments: AAOx4 and able to follow 1-step commands consistantly. Emergent awareness and Sustained attention. Pt can be tangential in conversation and requires occasional cues to maintain attention to task. Decreased safety awareness and decreased insight into deficits. Attention: Sustained Sustained Attention: Appears intact Memory: Impaired Memory Impairment: Retrieval deficit Awareness: Appears intact Problem Solving: Impaired Problem Solving Impairment: Verbal basic    Extremity Assessment (includes Sensation/Coordination)   Upper Extremity Assessment: Right hand dominant, Generalized weakness, RUE deficits/detail, LUE deficits/detail RUE Deficits / Details: decreased AROM shoulder flexion to approx. 100 degrees at baseline; generalized weakness LUE Deficits / Details: mildly impaired AROM shoulder flexion at baseline but WFL; generalized weaknses  Lower Extremity Assessment: Defer to PT evaluation RLE Deficits / Details: post-op BKA; able to perform quad set with  0 deg extension achieved, SLR without quad lag RLE Sensation: decreased light touch, history of peripheral neuropathy RLE Coordination: WNL LLE Deficits / Details: WFL given BKA LLE Sensation: WNL LLE Coordination: WNL     ADLs   Overall ADL's : Needs assistance/impaired Eating/Feeding: Independent, Sitting Eating/Feeding Details (indicate cue type and reason): requires orientation to tray due to vision impairment Grooming: Set up, Sitting Upper Body Bathing: Contact guard assist, Sitting Upper Body Bathing Details (indicate cue type and reason): simulated Lower Body Bathing: Moderate assistance, Sitting/lateral leans Lower Body Bathing Details (indicate cue type and reason):  simulated Upper Body Dressing : Contact guard assist, Sitting Lower Body Dressing: Moderate assistance, Sitting/lateral leans Toilet Transfer: Minimal assistance, +2 for safety/equipment, BSC/3in1, Requires drop arm (lateral-scoot) Toilet Transfer Details (indicate cue type and reason): simulated task Toileting- Clothing Manipulation and Hygiene: Moderate assistance, Sitting/lateral lean Toileting - Clothing Manipulation Details (indicate cue type and reason): with increased time Functional mobility during ADLs: Minimal assistance, +2 for safety/equipment, Rolling walker (2 wheels)     Mobility   Overal bed mobility: Needs Assistance Bed Mobility: Supine to Sit, Sit to Supine Supine to sit: Min assist, +2 for safety/equipment Sit to supine: Mod assist, +2 for physical assistance General bed mobility comments: min assist +2 supine to sit for trunk elevation, steadying, scoot to EOB. Mod +2 for return to supine for LE lifting, trunk lower, and boost up in bed with bed pad.     Transfers   Overall transfer level: Needs assistance Equipment used: Rolling walker (2 wheels) Transfers: Bed to chair/wheelchair/BSC Sit to Stand: Contact guard assist Bed to/from chair/wheelchair/BSC transfer type:: Lateral/scoot transfer Step pivot transfers: Contact guard assist  Lateral/Scoot Transfers: Min assist, +2 safety/equipment General transfer comment: assist for hip translation, steadying     Ambulation / Gait / Stairs / Wheelchair Mobility   Ambulation/Gait General Gait Details: deferred for safety due to lack of +2 for chair follow and slight dizziness Pre-gait activities: marching     Posture / Balance Dynamic Sitting Balance Sitting balance - Comments: sitting EOB and sitting up in recliner Balance Overall balance assessment: Needs assistance Sitting-balance support: No upper extremity supported, Feet supported Sitting balance-Leahy Scale: Fair Sitting balance - Comments: sitting EOB and  sitting up in recliner Standing balance support: Bilateral upper extremity supported, During functional activity, Reliant on assistive device for balance Standing balance-Leahy Scale: Poor Standing balance comment: unable to attempt as pt could not get prosthetic on and s/p R BKA     Special needs/care consideration Continuous Drip IV  n/a, Wound Vac leg/right, Skin Surgical incision: leg/right, and Diabetic management Novolog 0-6 units 3x daily with meals; Semglee 10 units daily        Previous Home Environment (from acute therapy documentation) Living Arrangements: Spouse/significant other, Other relatives (mother-in-law)  Lives With: Spouse, Family Available Help at Discharge: Family, Available 24 hours/day Type of Home: Apartment Home Layout: One level Home Access: Level entry Entrance Stairs-Number of Steps: 1 Bathroom Shower/Tub: Armed forces operational officer Accessibility: Yes How Accessible: Accessible via walker, Accessible via wheelchair Home Care Services: No   Discharge Living Setting Plans for Discharge Living Setting: Patient's home (moving to a new place) Type of Home at Discharge: Apartment Discharge Home Layout: One level Discharge Home Access: Stairs to enter Entrance Stairs-Rails: Right, Left, Can reach both Entrance Stairs-Number of Steps: 5 steps in front, 4 steps in back Discharge Bathroom Shower/Tub: Tub/shower unit Discharge Bathroom Toilet: Standard Discharge Bathroom Accessibility: Yes How Accessible:  Accessible via walker Does the patient have any problems obtaining your medications?: No   Social/Family/Support Systems Anticipated Caregiver: [name], wife Anticipated Caregiver's Contact Information: [phone] Caregiver Availability: 24/7 Discharge Plan Discussed with Primary Caregiver: Yes Is Caregiver In Agreement with Plan?: Yes Does Caregiver/Family have Issues with Lodging/Transportation while Pt is in Rehab?: No      Goals Patient/Family Goal for Rehab: 7-10 days Expected length of stay: PT/OT/SLP Supervision to Mod I  Pt/Family Agrees to Admission and willing to participate: Yes Program Orientation Provided & Reviewed with Pt/Caregiver Including Roles  & Responsibilities: Yes     Decrease burden of Care through IP rehab admission: NA     Possible need for SNF placement upon discharge:Not anticipated     Patient Condition: This patient's medical and functional status has changed since the consult dated: 06/21/23 in which the Rehabilitation Physician determined and documented that the patient's condition is appropriate for intensive rehabilitative care in an inpatient rehabilitation facility. See "History of Present Illness" (above) for medical update. Functional changes are: Pt,. Now min A to CGA. Patient's medical and functional status update has been discussed with the Rehabilitation physician and patient remains appropriate for inpatient rehabilitation. Will admit to inpatient rehab today.   Preadmission Screen Completed By:  Domingo Pulse, CCC-SLP, 06/19/2023 3:55 PM ______________________________________________________________________   Discussed status with Dr. Riley Kill on 06/29/23 at 930 and received approval for admission today.   Admission Coordinator:  Domingo Pulse, time 1120 Dorna Bloom 06/29/23

## 2023-06-29 NOTE — Discharge Summary (Signed)
DISCHARGE SUMMARY  [name].  MR#: [id number]  DOB:10/10/1950  Date of Admission: 06/06/2023 Date of Discharge: 06/29/2023  Attending Physician:[name] Shatswell Silvestre Gunner, MD  Patient's ZHY:QMVH, [name] Cowden, MD  Disposition: D/C to CIR   Follow-up Appts:  Follow-up Information     [name]. Schedule an appointment as soon as possible for a visit in 1 month(s).   Specialty: Neurology Why: stroke clinic Contact information: [address] [place] [address] [phone]        [name] Mustard, MD Follow up in 1 week(s).   Specialty: Orthopedic Surgery Contact information: [address] [place] [address] [phone]                 Tests Needing Follow-up: -will need to f/u w/ Dr. 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   Following his admission a full evaluation directed by orthopedics and vascular surgery revealed poor runoff to the right foot and the patient ultimately underwent a BKA.  His hospitalization has been further complicated by the acute onset of right sided blurry vision leading to a workup which revealed an acute stroke.  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 - PVD Has completed a course of broad antibiotic therapy - was seen in consultation by Orthopedic Surgery and Vascular Surgery - ultimately underwent R BKA 9/20 - PT/OT recommend inpatient rehab stay - medically  cleared for d/c    Acute CVA Diagnosed during this hospital stay - has been seen by Stroke Service/Neurology who ultimately signed off 9/15 - recommendation was DAPT x 3 weeks (through 07/04/23) with aspirin and Plavix, followed by aspirin alone thereafter   Dysphagia Workup initially revealed severe dysmotility on esophagram - GI consultation had to be delayed due to the patient's acute CVA 9/14 - during CVA workup patient was cleared for regular diet by SLP - GI recommended discontinuation of opioids with aggressive treatment of constipation with consideration being given to EGD as an outpatient when the patient's acute illness has fully stabilized - GI initiated fluconazole for empiric treatment of possible esophageal candidiasis - much improved clinically following these measures with patient tolerating regular diet without difficulty   Isolated fever 9/25 Etiology unclear - CXR not convincing for a true pulmonary infiltrate, and no other sx to support PNA  - follow-up CBC revealed improving WBC count - no clinical symptoms to suggest UTI - has not recurred   DM2 CBG reasonably well-controlled   Status post pancreas and kidney transplant Continued usual antirejection medications to include mycophenolate and tacrolimus - no evidence of acute complications   HTN [name] pressure controlled    HLD Continue Lipitor, with dose increased to 80 mg during this hospital stay due to CVA   Nutrition Status: Nutrition Problem: Moderate Malnutrition Etiology: chronic illness (uncontrolled DM) Signs/Symptoms: mild fat depletion, mild muscle depletion Interventions: Ensure Enlive (each supplement provides 350kcal and 20 grams of protein), Refer to RD note for recommendations   Medications at time of Transfer:  Current Facility-Administered Medications:    acetaminophen (TYLENOL) tablet 650 mg, 650 mg, Oral, Q6H PRN, [name] Blood, MD, 650 mg at 06/29/23 0317   alum & mag hydroxide-simeth  (MAALOX/MYLANTA) 200-200-20 MG/5ML suspension 15-30 mL, 15-30 mL, Oral, Q2H PRN, [name] Mustard, MD   amLODipine (NORVASC) tablet 10 mg, 10 mg, Oral, Daily, [name] Blood, MD, 10 mg at 06/28/23 9562   ascorbic acid (VITAMIN C) tablet 1,000 mg, 1,000 mg, Oral, Daily, [name] Mustard, MD, 1,000 mg at 06/28/23 1308   aspirin EC tablet 81 mg, 81 mg, Oral, Daily, [name] Mustard, MD, 81 mg at 06/28/23 0817   atorvastatin (LIPITOR) tablet 80 mg, 80 mg, Oral, QHS, [name] Mustard, MD, 80 mg at 06/28/23 2154   bisacodyl (DULCOLAX) suppository 10 mg, 10 mg, Rectal, Daily PRN, [name] Blood, MD   clopidogrel (PLAVIX) tablet 75 mg, 75 mg, Oral, [name] breakfast, [name], RPH-CPP, 75 mg at 06/28/23 0817   enoxaparin (LOVENOX) injection 40 mg, 40 mg, Subcutaneous, Q24H, [name] Mustard, MD, 40 mg at 06/28/23 0816   feeding supplement (ENSURE ENLIVE / ENSURE PLUS) liquid 237 mL, 237 mL, Oral, BID BM, [name] Kief, MD, 237 mL at 06/28/23 0818   [COMPLETED] fluconazole (DIFLUCAN) tablet 400 mg, 400 mg, Oral, Once, 400 mg at 06/23/23 1653 **FOLLOWED BY** fluconazole (DIFLUCAN) tablet 200 mg, 200 mg, Oral, Daily, [name] Duhamel T, MD, 200 mg at 06/28/23 0818   guaiFENesin-dextromethorphan (ROBITUSSIN DM) 100-10 MG/5ML syrup 15 mL, 15 mL, Oral, Q4H PRN, [name] Mustard, MD, 15 mL at 06/22/23 1009   insulin aspart (novoLOG) injection 0-6 Units, 0-6 Units, Subcutaneous, TID WC, [name] Mustard, MD, 1 Units at 06/28/23 1629   insulin glargine-yfgn (SEMGLEE) injection 7 Units, 7 Units, Subcutaneous, Daily, [name] Kief, MD, 7 Units at 06/28/23 0818   lactulose (CHRONULAC) 10 GM/15ML solution 30 g, 30 g, Oral, BID PRN, [name] Blood, MD   losartan (COZAAR) tablet 50 mg, 50 mg, Oral, Daily, [name] Mustard, MD, 50 mg at 06/28/23 0817   melatonin tablet 3 mg, 3 mg, Oral, QHS PRN, [name] Mustard, MD, 3 mg at 06/27/23 2022   metoCLOPramide (REGLAN) tablet 5 mg, 5 mg, Oral, TID AC, [name] Blood, MD, 5 mg at  06/28/23 1357   mycophenolate (CELLCEPT) capsule 1,000 mg, 1,000 mg, Oral, BID, [name] Mustard, MD, 1,000 mg at 06/28/23 2155   nutrition supplement (JUVEN) (JUVEN) powder packet 1 packet, 1 packet, Oral, BID BM, [name] Mustard, MD, 1 packet at 06/28/23 0816   ondansetron [facility]) injection 4 mg, 4 mg, Intravenous, Q6H PRN, [name] Mustard, MD, 4 mg at 06/23/23 1036   oxyCODONE (Oxy IR/ROXICODONE) immediate release tablet 5 mg, 5 mg, Oral, Q4H PRN, [name] Blood, MD, 5 mg at 06/29/23 0317   pantoprazole (PROTONIX) EC tablet 40 mg, 40 mg, Oral, BID, [name] Blood, MD, 40 mg at 06/28/23 2155   phenol (CHLORASEPTIC) mouth spray 1 spray, 1 spray, Mouth/Throat, PRN, [name] Mustard, MD   polyethylene glycol (MIRALAX / GLYCOLAX) packet 17 g, 17 g, Oral, BID, [name] Kief, MD, 17 g at 06/28/23 2155   senna-docusate (Senokot-S) tablet 1 tablet, 1 tablet, Oral, BID, [name] Blood, MD, 1 tablet at 06/28/23 2155   sodium chloride flush (NS) 0.9 % injection 3 mL, 3 mL, Intravenous, Q12H, [name] Mustard, MD, 3 mL at 06/28/23 2156   sodium chloride flush (NS) 0.9 % injection 3 mL,  3 mL, Intravenous, PRN, [name] Mustard, MD   tacrolimus (PROGRAF) capsule 5 mg, 5 mg, Oral, BID, [name] Mustard, MD, 5 mg at 06/28/23 2155   traZODone (DESYREL) tablet 50 mg, 50 mg, Oral, QHS PRN, [name] Mustard, MD, 50 mg at 06/27/23 2021   Vitamin D (Ergocalciferol) (DRISDOL) 1.25 MG (50000 UNIT) capsule 50,000 Units, 50,000 Units, Oral, [name] Wed, [name] Mustard, MD, 50,000 Units at 06/23/23 1025   zinc sulfate capsule 220 mg, 220 mg, Oral, Daily, [name] Mustard, MD, 220 mg at 06/28/23 0817   Day of Discharge BP (!) 119/46 (BP Location: Left Arm)   Pulse 72   Temp 98 F (36.7 C)   Resp 18   Ht 5\' 10"  (1.778 m)   Wt 72.1 kg   SpO2 99%   BMI 22.81 kg/m   Physical Exam: General: No acute respiratory distress Lungs: Clear to auscultation bilaterally without wheezes or crackles Cardiovascular: Regular rate and  rhythm without murmur gallop or rub normal S1 and S2 Abdomen: Nontender, nondistended, soft, bowel sounds positive, no rebound, no ascites, no appreciable mass Extremities: No significant peripheral edema   Basic Metabolic Panel: Recent Labs  Lab 06/23/23 0626 06/24/23 0500 06/28/23 0334  NA 137 137 135  K 4.3 4.0 4.8  CL 104 105 98  CO2 20* 21* 23  GLUCOSE 116* 116* 114*  BUN 30* 17 15  CREATININE 1.25* 1.10 1.17  CALCIUM 9.7 9.1 9.4  MG 1.6*  --   --     CBC: Recent Labs  Lab 06/23/23 0626 06/24/23 0500 06/25/23 0958  WBC 15.5* 13.1* 11.5*  HGB 9.3* 8.3* 8.3*  HCT 29.7* 26.2* 25.0*  MCV 80.9 79.6* 78.1*  PLT 265 266 266    Time spent in discharge (includes decision making & examination of pt): 35 minutes  06/29/2023, 10:09 AM   [name] Blood, MD [facility] Office  [phone]

## 2023-06-29 NOTE — Progress Notes (Signed)
Physical Medicine and Rehabilitation Consult Reason for Consult:right BKA Referring Physician: Rhona Leavens     HPI: [name]. is a 72 y.o. male with a hx of renal and pancreas transplants at [facility] as well as DM, prior left BKA who developed a worsening wound in his right foot who was admitted on 06/06/23 with sepsis d/t a right foot wound infection. Plans were for a right 1st ray amputation and then the patient developed headaches, dizziness and dysphagia. 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 Pt was seen by neurology who felt the stroke was due to small vessel disease/numerous risk factors. Plan was DAPT for 3 weeks followed by aspirin alone. The findings were felt to be incidental on MRI. After salvage attempts the pt opted for a right BKA which was performed on 9/20 by Dr. Lajoyce Corners. Pt was seen by therapies today and was min assist to scoot edge of bed. With increased time and positioning he was able to scoot from bed to recliner with supervision/ min assist. His left BKA prosthesis does not work as I believe a sock was shoved in between the socket and silicone liner. The liner will no longer sit down in the socket nor can it be removed.      Review of Systems  Constitutional:  Positive for fever.  Eyes:        Blindness  Respiratory:  Negative for cough.   Cardiovascular:  Negative for chest pain.  Gastrointestinal:  Negative for heartburn.  Musculoskeletal:  Positive for joint pain and myalgias.  Skin:  Negative for rash.  Neurological:  Positive for dizziness and focal weakness.  Psychiatric/Behavioral:  Negative for depression.         Past Medical History:  Diagnosis Date   Anxiety     COVID-19 virus infection 09/2020   CPAP (continuous positive airway pressure) dependence     Diabetes mellitus without complication (HCC)     GERD (gastroesophageal reflux disease)     Hypertension     Legally blind  in left eye, as defined in [place]     PTSD (post-traumatic stress disorder)     PVD (peripheral vascular disease) (HCC) 01/17/2015   Renal disorder     Sleep apnea     Thyroid disease               Past Surgical History:  Procedure Laterality Date   ABDOMINAL AORTAGRAM N/A 01/21/2015    Procedure: ABDOMINAL Ronny Flurry;  Surgeon: Chuck Hint, MD;  Location: [facility] CATH LAB;  Service: Cardiovascular;  Laterality: N/A;   ABDOMINAL AORTOGRAM W/LOWER EXTREMITY Right 06/15/2023    Procedure: ABDOMINAL AORTOGRAM W/LOWER EXTREMITY;  Surgeon: Nada Libman, MD;  Location: [facility] INVASIVE CV LAB;  Service: Cardiovascular;  Laterality: Right;   AMPUTATION Left 01/23/2015    Procedure: FIRST RAY AMPUTATION/LEFT FOOT;  Surgeon: Nadara Mustard, MD;  Location: [facility] OR;  Service: Orthopedics;  Laterality: Left;   AMPUTATION Left 02/20/2015    Procedure: AMPUTATION BELOW KNEE;  Surgeon: Nadara Mustard, MD;  Location: [facility] OR;  Service: Orthopedics;  Laterality: Left;   AMPUTATION Right 06/18/2023    Procedure: RIGHT BELOW KNEE AMPUTATION;  Surgeon: Nadara Mustard, MD;  Location: [facility] OR;  Service: Orthopedics;  Laterality: Right;   AV FISTULA PLACEMENT       BALLOON DILATION N/A 02/26/2021    Procedure: BALLOON DILATION;  Surgeon: Hilarie Fredrickson, MD;  Location: [facility]  ENDOSCOPY;  Service: Endoscopy;  Laterality: N/A;   BIOPSY   02/26/2021    Procedure: BIOPSY;  Surgeon: Hilarie Fredrickson, MD;  Location: [facility] ENDOSCOPY;  Service: Endoscopy;;   COLONOSCOPY       COMBINED KIDNEY-PANCREAS TRANSPLANT Left 2009   ESOPHAGOGASTRODUODENOSCOPY (EGD) WITH PROPOFOL N/A 02/26/2021    Procedure: ESOPHAGOGASTRODUODENOSCOPY (EGD) WITH PROPOFOL;  Surgeon: Hilarie Fredrickson, MD;  Location: [facility] ENDOSCOPY;  Service: Endoscopy;  Laterality: N/A;   INCISION AND DRAINAGE ABSCESS Left 01/13/2015    Procedure: INCISION AND DRAINAGE OF LEFT FOOT FIRST RAY ABCESS;  Surgeon: Kathryne Hitch, MD;  Location: [facility] OR;  Service: Orthopedics;  Laterality: Left;    LOWER EXTREMITY ANGIOGRAM Left 01/21/2015    Procedure: LOWER EXTREMITY ANGIOGRAM;  Surgeon: Chuck Hint, MD;  Location: [facility] CATH LAB;  Service: Cardiovascular;  Laterality: Left;   PERIPHERAL VASCULAR BALLOON ANGIOPLASTY Right 06/15/2023    Procedure: PERIPHERAL VASCULAR BALLOON ANGIOPLASTY;  Surgeon: Nada Libman, MD;  Location: [facility] INVASIVE CV LAB;  Service: Cardiovascular;  Laterality: Right;  right AT             Family History  Problem Relation Age of Onset   Hypertension Other          Social History:  reports that he has quit smoking. He has never used smokeless tobacco. He reports current alcohol use. He reports current drug use. Drug: Marijuana. Allergies:  Allergies  No Known Allergies         Medications Prior to Admission  Medication Sig Dispense Refill   amLODipine (NORVASC) 10 MG tablet Take 10 mg by mouth daily.       ascorbic acid (VITAMIN C) 250 MG tablet Take 250 mg by mouth once a week. Take 1 tablet by mouth on Monday, Wednesday, and Friday for iron absorption.       aspirin EC 81 MG tablet Take 81 mg by mouth daily.       atorvastatin (LIPITOR) 80 MG tablet Take 40 mg by mouth at bedtime.        cyclobenzaprine (FLEXERIL) 5 MG tablet Take 1 tablet (5 mg total) by mouth 2 (two) times daily as needed for muscle spasms. 10 tablet 0   doxycycline (VIBRA-TABS) 100 MG tablet Take 1 tablet (100 mg total) by mouth 2 (two) times daily. 28 tablet 0   ergocalciferol (VITAMIN D2) 1.25 MG (50000 UT) capsule Take 50,000 Units by mouth every Wednesday.       ferrous sulfate 325 (65 FE) MG tablet Take 325 mg by mouth once a week. Take 1 tablet by mouth on Monday, Wednesday, and Friday       furosemide (LASIX) 20 MG tablet Take 1 tablet (20 mg total) by mouth daily. 30 tablet 2   insulin glargine (LANTUS SOLOSTAR) 100 UNIT/ML Solostar Pen Inject 28 Units into the skin daily. 30 mL 3   labetalol (NORMODYNE) 100 MG tablet Take 2 tablets (200 mg total) by mouth 2 (two)  times daily. 60 tablet 0   lisinopril (PRINIVIL,ZESTRIL) 10 MG tablet Take 1 tablet (10 mg total) by mouth daily. 30 tablet 0   losartan (COZAAR) 50 MG tablet Take 50 mg by mouth daily.       metFORMIN (GLUCOPHAGE-XR) 500 MG 24 hr tablet Take 1 tablet (500 mg total) by mouth 2 (two) times daily. 180 tablet 3   mycophenolate (CELLCEPT) 250 MG capsule Take 1,000 mg by mouth 2 (two) times daily.       pantoprazole (  PROTONIX) 40 MG tablet Take 1 tablet (40 mg total) by mouth daily. 30 tablet 3   potassium chloride 20 MEQ TBCR Take 20 mEq by mouth daily. 7 tablet 0   tacrolimus (PROGRAF) 5 MG capsule Take 5 mg by mouth 2 (two) times daily.       traZODone (DESYREL) 50 MG tablet Take 50 mg by mouth at bedtime as needed for sleep.       sildenafil (VIAGRA) 50 MG tablet Take 50 mg by mouth as needed.              Home: Home Living Family/patient expects to be discharged to:: Private residence Living Arrangements: Spouse/significant other, Other relatives (mother-in-law) Available Help at Discharge: Family, Available 24 hours/day Type of Home: Apartment Home Access: Level entry Entrance Stairs-Number of Steps: 1 Home Layout: One level Bathroom Shower/Tub: Engineer, manufacturing systems: Standard Bathroom Accessibility: Yes Home Equipment: Cane - single point, Shower seat, Grab bars - toilet, Grab bars - tub/shower, BSC/3in1, Firefighter  Lives With: Spouse, Family  Functional History: Prior Function Prior Level of Function : Independent/Modified Independent Mobility Comments: At baseline, pt wears prosthesis every day, ambulates with SPC. Since admitted, pt has been light assist for transfers ADLs Comments: At baseline pt is Independent with ADLs. Functional Status:  Mobility: Bed Mobility Overal bed mobility: Needs Assistance Bed Mobility: Supine to Sit Supine to sit: Min assist, HOB elevated, Used rails Sit to supine: Mod assist, +2 for physical assistance General bed mobility  comments: Pt presented in chair at this time and recently was got up with PT Transfers Overall transfer level: Needs assistance Equipment used: None Transfers: Bed to chair/wheelchair/BSC Sit to Stand: Supervision, Contact guard assist Bed to/from chair/wheelchair/BSC transfer type:: Lateral/scoot transfer Step pivot transfers: Contact guard assist  Lateral/Scoot Transfers: Contact guard assist, Supervision General transfer comment: patient generally supervision for lateral scoot transfer to recliner. Increased time needed to complete and cues for safety. Ambulation/Gait General Gait Details: deferred for safety due to lack of +2 for chair follow and slight dizziness Pre-gait activities: marching   ADL: ADL Overall ADL's : Needs assistance/impaired Eating/Feeding: Sitting, Set up Eating/Feeding Details (indicate cue type and reason): due to vision and depending on how many things are on the tray and lighting depdning on how much set up is required Grooming: Set up, Sitting Upper Body Bathing: Minimal assistance, Sitting Upper Body Bathing Details (indicate cue type and reason): simulated Lower Body Bathing: Moderate assistance, Sitting/lateral leans (long sitting in chaor) Lower Body Bathing Details (indicate cue type and reason): simulated Upper Body Dressing : Minimal assistance, Sitting Lower Body Dressing: Moderate assistance, Sitting/lateral leans Lower Body Dressing Details (indicate cue type and reason): long sitting Toilet Transfer: Minimal assistance, +2 for safety/equipment, BSC/3in1, Requires drop arm (lateral-scoot) Toilet Transfer Details (indicate cue type and reason): simulated task Toileting- Clothing Manipulation and Hygiene: Moderate assistance, Sitting/lateral lean Toileting - Clothing Manipulation Details (indicate cue type and reason): with increased time Functional mobility during ADLs: Minimal assistance, +2 for safety/equipment, Rolling walker (2 wheels)    Cognition: Cognition Overall Cognitive Status: Within Functional Limits for tasks assessed Arousal/Alertness: Awake/alert Orientation Level: Oriented X4 Attention: Sustained Sustained Attention: Appears intact Memory: Impaired Memory Impairment: Retrieval deficit Awareness: Appears intact Problem Solving: Impaired Problem Solving Impairment: Verbal basic Cognition Arousal: Alert Behavior During Therapy: WFL for tasks assessed/performed Overall Cognitive Status: Within Functional Limits for tasks assessed General Comments: AAOx4 and able to follow 1-step commands consistantly. Emergent awareness and Sustained attention. Pt can  be tangential in conversation and requires occasional cues to maintain attention to task. Decreased safety awareness and decreased insight into deficits.   Blood pressure 138/89, pulse 92, temperature 97.7 F (36.5 C), temperature source Oral, resp. rate 16, height 5\' 10"  (1.778 m), weight 71.1 kg, SpO2 99%. Physical Exam Constitutional:      Appearance: He is normal weight.  HENT:     Head: Normocephalic.     Right Ear: External ear normal.     Left Ear: External ear normal.     Mouth/Throat:     Pharynx: Oropharynx is clear.  Eyes:     Comments: Minimal vision thru left eye  Cardiovascular:     Rate and Rhythm: Normal rate and regular rhythm.     Heart sounds: No murmur heard.    No gallop.  Pulmonary:     Effort: Pulmonary effort is normal. No respiratory distress.     Breath sounds: No wheezing.  Abdominal:     General: There is no distension.     Palpations: Abdomen is soft.     Tenderness: There is no abdominal tenderness.  Musculoskeletal:     Cervical back: Normal range of motion.     Comments: Right BKA with black shrinker/vac. Residual swelling. Left BKA appropriately shaped.   Neurological:     Mental Status: He is alert.     Comments: Alert and oriented x 3. Normal insight and awareness. Intact Memory. Normal language and speech.  Cranial nerve exam unremarkable for poor vision OS. MMT: grossly 4/5 prox to distal. RLE 2/5 HF. LE5/5 HF, KE. No focal sensory findings appreciated. .          Lab Results Last 24 Hours       Results for orders placed or performed during the hospital encounter of 06/06/23 (from the past 24 hour(s))  Glucose, capillary     Status: Abnormal    Collection Time: 06/20/23  4:43 PM  Result Value Ref Range    Glucose-Capillary 128 (H) 70 - 99 mg/dL  Glucose, capillary     Status: Abnormal    Collection Time: 06/20/23  9:14 PM  Result Value Ref Range    Glucose-Capillary 160 (H) 70 - 99 mg/dL  Glucose, capillary     Status: None    Collection Time: 06/21/23  5:41 AM  Result Value Ref Range    Glucose-Capillary 87 70 - 99 mg/dL  Comprehensive metabolic panel     Status: Abnormal    Collection Time: 06/21/23 10:08 AM  Result Value Ref Range    Sodium 137 135 - 145 mmol/L    Potassium 4.5 3.5 - 5.1 mmol/L    Chloride 105 98 - 111 mmol/L    CO2 17 (L) 22 - 32 mmol/L    Glucose, Bld 118 (H) 70 - 99 mg/dL    BUN 28 (H) 8 - 23 mg/dL    Creatinine, Ser 5.36 (H) 0.61 - 1.24 mg/dL    Calcium 9.9 8.9 - 64.4 mg/dL    Total Protein 7.0 6.5 - 8.1 g/dL    Albumin 3.1 (L) 3.5 - 5.0 g/dL    AST 38 15 - 41 U/L    ALT 29 0 - 44 U/L    Alkaline Phosphatase 91 38 - 126 U/L    Total Bilirubin 1.1 0.3 - 1.2 mg/dL    GFR, Estimated 58 (L) >60 mL/min    Anion gap 15 5 - 15  CBC     Status: Abnormal  Collection Time: 06/21/23 10:08 AM  Result Value Ref Range    WBC 16.9 (H) 4.0 - 10.5 K/uL    RBC 3.74 (L) 4.22 - 5.81 MIL/uL    Hemoglobin 9.4 (L) 13.0 - 17.0 g/dL    HCT 16.1 (L) 09.6 - 52.0 %    MCV 81.3 80.0 - 100.0 fL    MCH 25.1 (L) 26.0 - 34.0 pg    MCHC 30.9 30.0 - 36.0 g/dL    RDW 04.5 40.9 - 81.1 %    Platelets 266 150 - 400 K/uL    nRBC 0.0 0.0 - 0.2 %  Glucose, capillary     Status: Abnormal    Collection Time: 06/21/23 12:38 PM  Result Value Ref Range    Glucose-Capillary 183 (H) 70 -  99 mg/dL    Comment 1 Notify RN      Comment 2 Document in Chart         Imaging Results (Last 48 hours)  DG Abd 1 View   Result Date: 06/20/2023 CLINICAL DATA:  [id number] with nausea and vomiting, and weakness. EXAM: ABDOMEN - 1 VIEW COMPARISON:  Last abdomen and pelvis CT was 02/22/2021, last abdomen series was 01/25/2020. FINDINGS: The bowel gas pattern is nonobstructive, with moderately dense stool with contrast, mild-to-moderate amount in the large bowel with aeration through into the rectum. There is no supine evidence of free air. Postsurgical changes are present right mid abdomen. Heavy iliofemoral calcific arteriosclerosis. No radio-opaque calculi or other significant radiographic abnormality are seen. Lung bases are clear. IMPRESSION: 1. Nonobstructive bowel gas pattern. 2. Moderate dense stool burden with contrast. 3. Heavy iliofemoral calcific arteriosclerosis. Electronically Signed   By: Almira Bar M.D.   On: 06/20/2023 22:22       Assessment/Plan: Diagnosis: 28 male with previous left BKA with osteomyelitis/infection right foot which ultimately required right BKA. Course complicated by small stroke Does the need for close, 24 hr/day medical supervision in concert with the patient's rehab needs make it unreasonable for this patient to be served in a less intensive setting? Yes Co-Morbidities requiring supervision/potential complications:  -skin care/wound/ID -prosthetist assessment for left BKA socket. It needs to manipulated so that liner can be removed from socket. -pain mgt -renal and pancreas transplant patient Due to bladder management, bowel management, safety, skin/wound care, disease management, medication administration, pain management, and patient education, does the patient require 24 hr/day rehab nursing? Yes Does the patient require coordinated care of a physician, rehab nurse, therapy disciplines of PT, OT to address physical and functional deficits in the context of  the above medical diagnosis(es)? Yes Addressing deficits in the following areas: balance, endurance, locomotion, strength, transferring, bowel/bladder control, bathing, dressing, feeding, grooming, toileting, and psychosocial support Can the patient actively participate in an intensive therapy program of at least 3 hrs of therapy per day at least 5 days per week? Yes The potential for patient to make measurable gains while on inpatient rehab is excellent Anticipated functional outcomes upon discharge from inpatient rehab are modified independent  with PT, modified independent with OT, n/a with SLP at wheelchair level. Estimated rehab length of stay to reach the above functional goals is: 7-10 days Anticipated discharge destination: Home Overall Rehab/Functional Prognosis: good   POST ACUTE RECOMMENDATIONS: This patient's condition is appropriate for continued rehabilitative care in the following setting: CIR Patient has agreed to participate in recommended program. Yes Note that insurance prior authorization may be required for reimbursement for recommended care.   Comment: Rehab Admissions  Coordinator to follow up.         I have personally performed a face to face diagnostic evaluation of this patient. Additionally, I have examined the patient's medical record including any pertinent labs and radiographic images. If the physician assistant has documented in this note, I have reviewed and edited or otherwise concur with the physician assistant's documentation.   Thanks,   Ranelle Oyster, MD 06/21/2023

## 2023-06-29 NOTE — H&P (Addendum)
Physical Medicine and Rehabilitation Admission H&P     CC: Functional deficits secondary to lacunar infarct, right BKA   HPI: [name], [name] is a 72 year old male who presented to the ED on 06/06/2023 for acute onset of tremors and chills. He has had ongoing outpatient treatment for right plantar foot ulcer. Met SIRS criteria on admission. Given cefepime and admitted to hospitalist service. Cellcept and Prograf restarted for history of renal/pancreas transplantation 2009. Follows mostly at the [facility] with Dr. Arrie Aran. He has a PMH also of PVD and left BKA in 2016.  Dr. Lajoyce Corners consulted. ABIs ordered and VVS consulted. He developed uncontrolled nausea and malaise on 9/12. Rapid response called 9/13.  He developed headaches, dizziness and dysphagia and GI was consulted and planned EGD. Prior to procedure, he had significantly elevated blood pressure with diaphoresis.  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  Neurology was consulted.  The patient reported some visual difficulties and that he had been dropping objects with his left hands.  Neurology concluded that his stroke was quite small and did not correlate with most of his deficits.  He was started on aspirin 81 mg daily and pending clearance from GI and vascular surgery he was recommended to start DAPT for 3 weeks followed by aspirin alone.  He underwent abdominal aortogram with bifemoral runoff and angioplasty of the right anterior tibial artery by Dr. Myra Gianotti on 9/17.  No inline flow to the dorsalis pedis artery.  Vascular surgery felt a ray amputation was reasonable to offer but that the likelihood of healing was low.  Discussed with Dr. Lajoyce Corners.  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  EGD deferred due to stroke and barium swallow obtained.  Barium swallow showed severe dysmotility and incomplete esophageal emptying with inability to pass a 13 mm  tablet. Treated for opioid associated constipation and empiric treatment for esophageal Candidiasis. Dysphagia improved.  He was able to complete bed mobility with supervision and lateral transfer to drop down chair to left side with contact-guard assist post set up of lines.  Once in a sitting position he was able to complete bilateral lateral upper extremity hygiene and dressing.The patient requires inpatient medicine and rehabilitation evaluations and services for ongoing dysfunction secondary to lacunar infarct and right BKA.   Review of Systems  Constitutional: Negative.   HENT: Negative.    Eyes: Negative.   Respiratory:  Negative for cough and hemoptysis.   Cardiovascular: Negative.   Gastrointestinal:  Negative for nausea and vomiting.  Genitourinary:  Negative for dysuria.  Musculoskeletal:  Positive for joint pain and myalgias.  Skin: Negative.   Neurological:  Negative for dizziness and headaches.  Psychiatric/Behavioral:  Negative for depression.         Past Medical History:  Diagnosis Date   Anxiety     COVID-19 virus infection 09/2020   CPAP (continuous positive airway pressure) dependence     Diabetes mellitus without complication (HCC)     GERD (gastroesophageal reflux disease)     Hypertension     Legally blind in left eye, as defined in [place]     PTSD (post-traumatic stress disorder)     PVD (peripheral vascular disease) (HCC) 01/17/2015   Renal disorder     Sleep apnea     Thyroid disease               Past Surgical History:  Procedure Laterality  Date   ABDOMINAL AORTAGRAM N/A 01/21/2015    Procedure: ABDOMINAL Ronny Flurry;  Surgeon: Chuck Hint, MD;  Location: [facility] CATH LAB;  Service: Cardiovascular;  Laterality: N/A;   ABDOMINAL AORTOGRAM W/LOWER EXTREMITY Right 06/15/2023    Procedure: ABDOMINAL AORTOGRAM W/LOWER EXTREMITY;  Surgeon: Nada Libman, MD;  Location: [facility] INVASIVE CV LAB;  Service: Cardiovascular;  Laterality: Right;   AMPUTATION Left  01/23/2015    Procedure: FIRST RAY AMPUTATION/LEFT FOOT;  Surgeon: Nadara Mustard, MD;  Location: [facility] OR;  Service: Orthopedics;  Laterality: Left;   AMPUTATION Left 02/20/2015    Procedure: AMPUTATION BELOW KNEE;  Surgeon: Nadara Mustard, MD;  Location: [facility] OR;  Service: Orthopedics;  Laterality: Left;   AMPUTATION Right 06/18/2023    Procedure: RIGHT BELOW KNEE AMPUTATION;  Surgeon: Nadara Mustard, MD;  Location: [facility] OR;  Service: Orthopedics;  Laterality: Right;   AV FISTULA PLACEMENT       BALLOON DILATION N/A 02/26/2021    Procedure: BALLOON DILATION;  Surgeon: Hilarie Fredrickson, MD;  Location: [facility] ENDOSCOPY;  Service: Endoscopy;  Laterality: N/A;   BIOPSY   02/26/2021    Procedure: BIOPSY;  Surgeon: Hilarie Fredrickson, MD;  Location: [facility] ENDOSCOPY;  Service: Endoscopy;;   COLONOSCOPY       COMBINED KIDNEY-PANCREAS TRANSPLANT Left 2009   ESOPHAGOGASTRODUODENOSCOPY (EGD) WITH PROPOFOL N/A 02/26/2021    Procedure: ESOPHAGOGASTRODUODENOSCOPY (EGD) WITH PROPOFOL;  Surgeon: Hilarie Fredrickson, MD;  Location: [facility] ENDOSCOPY;  Service: Endoscopy;  Laterality: N/A;   INCISION AND DRAINAGE ABSCESS Left 01/13/2015    Procedure: INCISION AND DRAINAGE OF LEFT FOOT FIRST RAY ABCESS;  Surgeon: Kathryne Hitch, MD;  Location: [facility] OR;  Service: Orthopedics;  Laterality: Left;   LOWER EXTREMITY ANGIOGRAM Left 01/21/2015    Procedure: LOWER EXTREMITY ANGIOGRAM;  Surgeon: Chuck Hint, MD;  Location: [facility] CATH LAB;  Service: Cardiovascular;  Laterality: Left;   PERIPHERAL VASCULAR BALLOON ANGIOPLASTY Right 06/15/2023    Procedure: PERIPHERAL VASCULAR BALLOON ANGIOPLASTY;  Surgeon: Nada Libman, MD;  Location: [facility] INVASIVE CV LAB;  Service: Cardiovascular;  Laterality: Right;  right AT             Family History  Problem Relation Age of Onset   Hypertension Other          Social History:  reports that he has quit smoking. He has never used smokeless tobacco. He reports current alcohol use. He reports current drug use.  Drug: Marijuana. Allergies:  Allergies  No Known Allergies         Medications Prior to Admission  Medication Sig Dispense Refill   amLODipine (NORVASC) 10 MG tablet Take 10 mg by mouth daily.       ascorbic acid (VITAMIN C) 250 MG tablet Take 250 mg by mouth once a week. Take 1 tablet by mouth on Monday, Wednesday, and Friday for iron absorption.       aspirin EC 81 MG tablet Take 81 mg by mouth daily.       atorvastatin (LIPITOR) 80 MG tablet Take 40 mg by mouth at bedtime.        cyclobenzaprine (FLEXERIL) 5 MG tablet Take 1 tablet (5 mg total) by mouth 2 (two) times daily as needed for muscle spasms. 10 tablet 0   doxycycline (VIBRA-TABS) 100 MG tablet Take 1 tablet (100 mg total) by mouth 2 (two) times daily. 28 tablet 0   ergocalciferol (VITAMIN D2) 1.25 MG (50000 UT) capsule Take 50,000 Units by mouth every  Wednesday.       ferrous sulfate 325 (65 FE) MG tablet Take 325 mg by mouth once a week. Take 1 tablet by mouth on Monday, Wednesday, and Friday       furosemide (LASIX) 20 MG tablet Take 1 tablet (20 mg total) by mouth daily. 30 tablet 2   insulin glargine (LANTUS SOLOSTAR) 100 UNIT/ML Solostar Pen Inject 28 Units into the skin daily. 30 mL 3   labetalol (NORMODYNE) 100 MG tablet Take 2 tablets (200 mg total) by mouth 2 (two) times daily. 60 tablet 0   lisinopril (PRINIVIL,ZESTRIL) 10 MG tablet Take 1 tablet (10 mg total) by mouth daily. 30 tablet 0   losartan (COZAAR) 50 MG tablet Take 50 mg by mouth daily.       metFORMIN (GLUCOPHAGE-XR) 500 MG 24 hr tablet Take 1 tablet (500 mg total) by mouth 2 (two) times daily. 180 tablet 3   mycophenolate (CELLCEPT) 250 MG capsule Take 1,000 mg by mouth 2 (two) times daily.       pantoprazole (PROTONIX) 40 MG tablet Take 1 tablet (40 mg total) by mouth daily. 30 tablet 3   potassium chloride 20 MEQ TBCR Take 20 mEq by mouth daily. 7 tablet 0   tacrolimus (PROGRAF) 5 MG capsule Take 5 mg by mouth 2 (two) times daily.       traZODone  (DESYREL) 50 MG tablet Take 50 mg by mouth at bedtime as needed for sleep.       sildenafil (VIAGRA) 50 MG tablet Take 50 mg by mouth as needed.                  Home: Home Living Family/patient expects to be discharged to:: Private residence Living Arrangements: Spouse/significant other, Other relatives (mother-in-law) Available Help at Discharge: Family, Available 24 hours/day Type of Home: Apartment Home Access: Level entry Entrance Stairs-Number of Steps: 1 Home Layout: One level Bathroom Shower/Tub: Engineer, manufacturing systems: Standard Bathroom Accessibility: Yes Home Equipment: Cane - single point, Shower seat, Grab bars - toilet, Grab bars - tub/shower, BSC/3in1, Firefighter  Lives With: Spouse, Family   Functional History: Prior Function Prior Level of Function : Independent/Modified Independent Mobility Comments: At baseline, pt wears prosthesis every day, ambulates with SPC. Since admitted, pt has been light assist for transfers ADLs Comments: At baseline pt is Independent with ADLs.   Functional Status:  Mobility: Bed Mobility Overal bed mobility: Needs Assistance Bed Mobility: Supine to Sit Supine to sit: Modified independent (Device/Increase time) Sit to supine: Mod assist, +2 for physical assistance General bed mobility comments: Posterior scooting with min A, cues for hand placement and head turning to assist pt with lift offs and navigation. Repositioned to long sitting with CGA Transfers Overall transfer level: Needs assistance Equipment used: None Transfers: Bed to chair/wheelchair/BSC Sit to Stand: Supervision, Contact guard assist Bed to/from chair/wheelchair/BSC transfer type:: Squat pivot Squat pivot transfers: Total assist Step pivot transfers: Contact guard assist  Lateral/Scoot Transfers: Contact guard assist, Supervision General transfer comment: Pt was able to scotter laterally at EOB with supervision and then when scooting from bed to  chair was CGA just to ensure clearing of chair to ensure no skin tear Ambulation/Gait General Gait Details: deferred for safety due to lack of +2 for chair follow and slight dizziness Pre-gait activities: marching   ADL: ADL Overall ADL's : Needs assistance/impaired Eating/Feeding: Sitting, Set up Eating/Feeding Details (indicate cue type and reason): due to vision Grooming: Wash/dry face, Wash/dry hands, Set  up, Sitting Upper Body Bathing: Contact guard assist, Sitting Upper Body Bathing Details (indicate cue type and reason): simulated Lower Body Bathing: Minimal assistance, Cueing for safety, Cueing for sequencing, Sitting/lateral leans Lower Body Bathing Details (indicate cue type and reason): simulated Upper Body Dressing : Contact guard assist, Cueing for safety, Cueing for sequencing, Sitting Lower Body Dressing: Minimal assistance, Sitting/lateral leans Lower Body Dressing Details (indicate cue type and reason): long sitting Toilet Transfer: Minimal assistance, Anterior/posterior Toilet Transfer Details (indicate cue type and reason): increased time needed to perform, two 1 min rest breaks due to fatigue. Toileting- Clothing Manipulation and Hygiene: Sitting/lateral lean, Contact guard assist Toileting - Clothing Manipulation Details (indicate cue type and reason): Educated pt on lateral leans to aid in wiping self, recommended pt have someone hold BSC while leaning for safety. OT held [facility] in place during session Functional mobility during ADLs: Minimal assistance, +2 for safety/equipment, Rolling walker (2 wheels) General ADL Comments: using reverse push ups to transfer to bed to chair to complete mobility at this time   Cognition: Cognition Overall Cognitive Status: Within Functional Limits for tasks assessed Arousal/Alertness: Awake/alert Orientation Level: Oriented X4 Attention: Sustained Sustained Attention: Appears intact Memory: Impaired Memory Impairment: Retrieval  deficit Awareness: Appears intact Problem Solving: Impaired Problem Solving Impairment: Verbal basic Cognition Arousal: Alert Behavior During Therapy: WFL for tasks assessed/performed Overall Cognitive Status: Within Functional Limits for tasks assessed General Comments: AAOx4 and able to follow 1-step commands consistantly. Emergent awareness and Sustained attention. Pt can be tangential in conversation and requires occasional cues to maintain attention to task. Decreased safety awareness and decreased insight into deficits.   Physical Exam: Blood pressure (!) 119/46, pulse 72, temperature 98 F (36.7 C), resp. rate 18, height 5\' 10"  (1.778 m), weight 72.1 kg, SpO2 99%. Physical Exam Constitutional:      General: He is in acute distress.  HENT:     Head: Normocephalic.     Nose: Nose normal.     Mouth/Throat:     Mouth: Mucous membranes are moist.  Eyes:     Pupils: Pupils are equal, round, and reactive to light.  Cardiovascular:     Rate and Rhythm: Normal rate and regular rhythm.     Heart sounds: No murmur heard.    No gallop.  Pulmonary:     Effort: Pulmonary effort is normal. No respiratory distress.     Breath sounds: Normal breath sounds. No wheezing.  Abdominal:     General: Bowel sounds are normal. There is no distension.     Palpations: Abdomen is soft.  Musculoskeletal:     Cervical back: Normal range of motion.     Comments: Right BK stump in KI, vac attached, no drainage in cannister  Skin:    Comments: Left residual limb intact and appropriately shaped  Neurological:     Mental Status: He is alert.     Comments: Alert and oriented x 3. Normal insight and awareness. Intact Memory. Normal language and speech. Cranial nerve exam unremarkable although pt is blind in his left eye. MMT: RUE 5/5, 4+/5 LUE. RLE limited by pain. LLE 4/5 HF, KE. Sensory exam normal for light touch and pain in all 4 limbs. No limb ataxia or cerebellar signs. No abnormal tone appreciated.   Marland Kitchen    Psychiatric:     Comments: Pt upset due to ongoing pain in right leg.        Lab Results Last 48 Hours  Results for orders placed or performed during the hospital encounter of 06/06/23 (from the past 48 hour(s))  Glucose, capillary     Status: Abnormal    Collection Time: 06/27/23 11:56 AM  Result Value Ref Range    Glucose-Capillary 150 (H) 70 - 99 mg/dL      Comment: Glucose reference range applies only to samples taken after fasting for at least 8 hours.    Comment 1 Notify RN    Glucose, capillary     Status: Abnormal    Collection Time: 06/27/23  4:02 PM  Result Value Ref Range    Glucose-Capillary 265 (H) 70 - 99 mg/dL      Comment: Glucose reference range applies only to samples taken after fasting for at least 8 hours.    Comment 1 Notify RN    Basic metabolic panel     Status: Abnormal    Collection Time: 06/28/23  3:34 AM  Result Value Ref Range    Sodium 135 135 - 145 mmol/L    Potassium 4.8 3.5 - 5.1 mmol/L    Chloride 98 98 - 111 mmol/L    CO2 23 22 - 32 mmol/L    Glucose, Bld 114 (H) 70 - 99 mg/dL      Comment: Glucose reference range applies only to samples taken after fasting for at least 8 hours.    BUN 15 8 - 23 mg/dL    Creatinine, Ser 7.82 0.61 - 1.24 mg/dL    Calcium 9.4 8.9 - 95.6 mg/dL    GFR, Estimated >21 >30 mL/min      Comment: (NOTE) Calculated using the CKD-EPI Creatinine Equation (2021)      Anion gap 14 5 - 15      Comment: Performed at [facility], [address], [place], [place] [address]  Glucose, capillary     Status: Abnormal    Collection Time: 06/28/23 12:23 PM  Result Value Ref Range    Glucose-Capillary 107 (H) 70 - 99 mg/dL      Comment: Glucose reference range applies only to samples taken after fasting for at least 8 hours.  Glucose, capillary     Status: Abnormal    Collection Time: 06/28/23  3:55 PM  Result Value Ref Range    Glucose-Capillary 167 (H) 70 - 99 mg/dL      Comment: Glucose reference range  applies only to samples taken after fasting for at least 8 hours.  Glucose, capillary     Status: Abnormal    Collection Time: 06/28/23  7:50 PM  Result Value Ref Range    Glucose-Capillary 229 (H) 70 - 99 mg/dL      Comment: Glucose reference range applies only to samples taken after fasting for at least 8 hours.  Glucose, capillary     Status: Abnormal    Collection Time: 06/29/23 12:49 AM  Result Value Ref Range    Glucose-Capillary 207 (H) 70 - 99 mg/dL      Comment: Glucose reference range applies only to samples taken after fasting for at least 8 hours.  Glucose, capillary     Status: Abnormal    Collection Time: 06/29/23  8:16 AM  Result Value Ref Range    Glucose-Capillary 164 (H) 70 - 99 mg/dL      Comment: Glucose reference range applies only to samples taken after fasting for at least 8 hours.    Comment 1 Document in Chart        Imaging Results (  Last 48 hours)  No results found.         Blood pressure (!) 119/46, pulse 72, temperature 98 F (36.7 C), resp. rate 18, height 5\' 10"  (1.778 m), weight 72.1 kg, SpO2 99%.   Medical Problem List and Plan: 1. Functional deficits secondary to right BKA 9/20 (hx of left BKA) with postop cerebral infarct in the right centrum semiovale             -patient may not yet shower             -ELOS/Goals: 7-10 days, goals mod I to supervision             -I contacted [facility] today about his left BKA. Someone put a prosthetic sock inside the socket of his prosthesis and then forced the pin-liner into the lock. Now the pin-liner can't be removed. They will come tomorrow to see if they can help remove the pin. Prosthesis might need to be taken apart.  2.  Antithrombotics: -DVT/anticoagulation:  Pharmaceutical: Lovenox             -antiplatelet therapy: Aspirin and Plavix for three weeks followed by aspirin alone.  Stop date for Plavix 10/10   3. Postop and phantom limb pain RLE: pt with severe pain this afternoon -Tylenol,  oxycodone as needed -add gabapentin for phantom limb pain, 100mg  TID   4. Mood/Behavior/Sleep: LCSW to evaluate and provide emotional support             -antipsychotic agents: n/a             -continue trazodone 50 mg prn sleep   5. Neuropsych/cognition: This patient is capable of making decisions on his own behalf.   6. Skin/Wound Care: Vac still in place from 9/20  -remove vac and apply black shrinker sock (ordered)             -f/u with Dr. Lajoyce Corners as outpt   7. Fluids/Electrolytes/Nutrition: Routine Is and Os and follow-up chemistries             -continue zinc, Vit D, C supplements             -carb modified diet   8: Hypertension: monitor TID and prn             -continue amlodipine 5 mg daily             -continue losartan 50 mg daily   9: Hyperlipidemia: continue statin     11: DM: A1c = 11.1%; CBGs QID             -continue SSI             -continue Semglee 7 units daily   12: s/p kidney/pancreas transplant: continue Cellcept, Prograf             -[facility]   13: Dysphagia/possible Candida esophagitis/GERD:             -continue Diflucan for a total of 10 days therapy             -continue Reglan TID             -continue Protonix BID             -follow-up GI as outpatient   14: Legally blind left eye     Milinda Antis, PA-C 06/29/2023  I have personally performed a face to face diagnostic evaluation of this patient and formulated  the key components of the plan.  Additionally, I have personally reviewed laboratory data, imaging studies, as well as relevant notes and concur with the physician assistant's documentation above.  The patient's status has not changed from the original H&P.  Any changes in documentation from the acute care chart have been noted above.  Ranelle Oyster, MD, [place]

## 2023-06-29 NOTE — Progress Notes (Signed)
Inpatient Rehabilitation Admission Medication Review by a Pharmacist  A complete drug regimen review was completed for this patient to identify any potential clinically significant medication issues.  High Risk Drug Classes Is patient taking? Indication by Medication  Antipsychotic No   Anticoagulant Yes Lovenox- vte ppx  Antibiotic Yes Diflucan- candida esophagitis  Opioid Yes OxyIR- acute pain  Antiplatelet Yes Aspirin, plavix- cva ppx x3 weeks f/b aspirin alone End date Plavix: 07/06/2023  Hypoglycemics/insulin Yes Insulin- T2DM  Vasoactive Medication Yes Norvasc, losartan- HTN  Chemotherapy No   Other Yes Lipitor- HLD Reglan- dysphagia Cellcept, tacrolimus- anti rejection meds- kidney and pancreas Trazodone- sleep Protonix- GERD     Type of Medication Issue Identified Description of Issue Recommendation(s)  Drug Interaction(s) (clinically significant)     Duplicate Therapy     Allergy     No Medication Administration End Date     Incorrect Dose     Additional Drug Therapy Needed     Significant med changes from prior encounter (inform family/care partners about these prior to discharge).    Other       Clinically significant medication issues were identified that warrant physician communication and completion of prescribed/recommended actions by midnight of the next day:  No    Time spent performing this drug regimen review (minutes):  30   Angelo Prindle BS, PharmD, BCPS Clinical Pharmacist 06/29/2023 2:41 PM  Contact: [phone] after 3 PM  "Be curious, not judgmental..." -Debbora Dus

## 2023-06-29 NOTE — Progress Notes (Signed)
Occupational Therapy Treatment Patient Details Name: [name]. MRN: [id number] DOB: [birth year] Today's Date: 06/29/2023   History of present illness Pt is 72 yo male who presents on 06/06/23 with generalized weakness and fatigue with tremors and chills. Has a wound under R great toe and plans for 1st ray amputation. 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 PMH: kidney transplant, DM, GERD, HTN, PTSD, PVD, L eye blindness, sleep apnea, thyroid disease, L BKA   OT comments  Pt at this time was in bed and agreed for therapy. Pt was motivated to get out of bed and complete ADLS. Pt was able to complete bed mobility with supervision and lateral transfer to drop down chair to the left side with CGA post set up of lines. Pt then once in a sitting position was able to complete BUE hygiene and dressing with set up and BLE hygiene with min assist. Acute Occupational Therapy will continue to follow to maximize ADLs at this level.       If plan is discharge home, recommend the following:  Two people to help with walking and/or transfers;A lot of help with bathing/dressing/bathroom;Assistance with cooking/housework;Assist for transportation;Help with stairs or ramp for entrance   Equipment Recommendations  None recommended by OT    Recommendations for Other Services Rehab consult    Precautions / Restrictions Precautions Precautions: Fall Precaution Comments: wound vac, watch HR, vision impairment Required Braces or Orthoses: Other Brace Other Brace: L prosthesis (unable to don this date, release button locked up), limb protector Restrictions Weight Bearing Restrictions: Yes RLE Weight Bearing: Non weight bearing Other Position/Activity Restrictions: wound vac       Mobility Bed Mobility Overal bed mobility: Needs Assistance Bed Mobility: Supine to Sit     Supine  to sit: Modified independent (Device/Increase time)          Transfers Overall transfer level: Needs assistance Equipment used: None              Lateral/Scoot Transfers: Contact guard assist, Supervision General transfer comment: Pt was able to scotter laterally at EOB with supervision and then when scooting from bed to chair was CGA just to ensure clearing of chair to ensure no skin tear     Balance Overall balance assessment: Needs assistance Sitting-balance support: No upper extremity supported Sitting balance-Leahy Scale: Good     Standing balance support: Bilateral upper extremity supported Standing balance-Leahy Scale: Poor                             ADL either performed or assessed with clinical judgement   ADL Overall ADL's : Needs assistance/impaired Eating/Feeding: Sitting;Set up Eating/Feeding Details (indicate cue type and reason): due to vision Grooming: Wash/dry face;Wash/dry hands;Set up;Sitting   Upper Body Bathing: Contact guard assist;Sitting   Lower Body Bathing: Minimal assistance;Cueing for safety;Cueing for sequencing;Sitting/lateral leans   Upper Body Dressing : Contact guard assist;Cueing for safety;Cueing for sequencing;Sitting   Lower Body Dressing: Minimal assistance;Sitting/lateral leans                 General ADL Comments: using reverse push ups to transfer to bed to chair to complete mobility at this time    Extremity/Trunk Assessment Upper Extremity Assessment Upper Extremity Assessment: Generalized weakness   Lower Extremity Assessment Lower Extremity Assessment: Defer to PT evaluation  Vision   Vision Assessment?: Vision impaired- to be further tested in functional context Additional Comments: Pt reported that decrease in blurred vision but still diffucilties with depth perception and figure ground of objects   Perception Perception Perception: Not tested   Praxis Praxis Praxis: Not tested     Cognition Arousal: Alert Behavior During Therapy: [facility] for tasks assessed/performed Overall Cognitive Status: Within Functional Limits for tasks assessed                                          Exercises      Shoulder Instructions       General Comments      Pertinent Vitals/ Pain       Pain Assessment Pain Assessment: 0-10 Pain Score: 1  Pain Location: RLE just with positioning of limb protector Pain Descriptors / Indicators: Aching, Discomfort Pain Intervention(s): Limited activity within patient's tolerance, Monitored during session, Repositioned  Home Living                                          Prior Functioning/Environment              Frequency  Min 2X/week        Progress Toward Goals  OT Goals(current goals can now be found in the care plan section)  Progress towards OT goals: Progressing toward goals  Acute Rehab OT Goals Patient Stated Goal: to get to rehab to be able to complete more activity OT Goal Formulation: With patient Time For Goal Achievement: 07/03/23 Potential to Achieve Goals: Good ADL Goals Pt Will Perform Grooming: with set-up;sitting Pt Will Perform Upper Body Bathing: with set-up;sitting Pt Will Perform Lower Body Bathing: with contact guard assist;with supervision Pt Will Perform Upper Body Dressing: with set-up;sitting Pt Will Perform Lower Body Dressing: with contact guard assist;with supervision Pt Will Transfer to Toilet: with contact guard assist;squat pivot transfer;bedside commode Pt Will Perform Toileting - Clothing Manipulation and hygiene: with contact guard assist;sitting/lateral leans  Plan      Co-evaluation                 AM-PAC OT "6 Clicks" Daily Activity     Outcome Measure   Help from another person eating meals?: A Little Help from another person taking care of personal grooming?: A Little Help from another person toileting, which includes using toliet,  bedpan, or urinal?: A Little Help from another person bathing (including washing, rinsing, drying)?: A Little Help from another person to put on and taking off regular upper body clothing?: A Little Help from another person to put on and taking off regular lower body clothing?: A Little 6 Click Score: 18    End of Session Equipment Utilized During Treatment: Gait belt  OT Visit Diagnosis: Other abnormalities of gait and mobility (R26.89);Muscle weakness (generalized) (M62.81)   Activity Tolerance Patient tolerated treatment well   Patient Left in chair;with call bell/phone within reach   Nurse Communication Mobility status        Time: 1610-9604 OT Time Calculation (min): 35 min  Charges: OT General Charges $OT Visit: 1 Visit OT Treatments $Self Care/Home Management : 23-37 mins  Presley Raddle OTR/L  Acute Rehab Services  [phone] office number   Alphia Moh 06/29/2023, 10:31 AM

## 2023-06-29 NOTE — H&P (Signed)
Physical Medicine and Rehabilitation Admission H&P   CC: Functional deficits secondary to lacunar infarct, right BKA  HPI: [name], [name] is a 72 year old male who presented to the ED on 06/06/2023 for acute onset of tremors and chills. He has had ongoing outpatient treatment for right plantar foot ulcer. Met SIRS criteria on admission. Given cefepime and admitted to hospitalist service. Cellcept and Prograf restarted for history of renal/pancreas transplantation 2009. Follows mostly at the [facility] with Dr. Arrie Aran. He has a PMH also of PVD and left BKA in 2016.  Dr. Lajoyce Corners consulted. ABIs ordered and VVS consulted. He developed uncontrolled nausea and malaise on 9/12. Rapid response called 9/13.  He developed headaches, dizziness and dysphagia and GI was consulted and planned EGD. Prior to procedure, he had significantly elevated blood pressure with diaphoresis.  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  Neurology was consulted.  The patient reported some visual difficulties and that he had been dropping objects with his left hands.  Neurology concluded that his stroke was quite small and did not correlate with most of his deficits.  He was started on aspirin 81 mg daily and pending clearance from GI and vascular surgery he was recommended to start DAPT for 3 weeks followed by aspirin alone.  He underwent abdominal aortogram with bifemoral runoff and angioplasty of the right anterior tibial artery by Dr. Myra Gianotti on 9/17.  No inline flow to the dorsalis pedis artery.  Vascular surgery felt a ray amputation was reasonable to offer but that the likelihood of healing was low.  Discussed with Dr. Lajoyce Corners.  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  EGD deferred due to stroke and barium swallow obtained.  Barium swallow showed severe dysmotility and incomplete esophageal emptying with inability to pass a 13 mm  tablet. Treated for opioid associated constipation and empiric treatment for esophageal Candidiasis. Dysphagia improved.  He was able to complete bed mobility with supervision and lateral transfer to drop down chair to left side with contact-guard assist post set up of lines.  Once in a sitting position he was able to complete bilateral lateral upper extremity hygiene and dressing.The patient requires inpatient medicine and rehabilitation evaluations and services for ongoing dysfunction secondary to lacunar infarct and right BKA.  Review of Systems  Constitutional: Negative.   HENT: Negative.    Eyes: Negative.   Respiratory:  Negative for cough and hemoptysis.   Cardiovascular: Negative.   Gastrointestinal:  Negative for nausea and vomiting.  Genitourinary:  Negative for dysuria.  Musculoskeletal:  Positive for joint pain and myalgias.  Skin: Negative.   Neurological:  Negative for dizziness and headaches.  Psychiatric/Behavioral:  Negative for depression.    Past Medical History:  Diagnosis Date   Anxiety    COVID-19 virus infection 09/2020   CPAP (continuous positive airway pressure) dependence    Diabetes mellitus without complication (HCC)    GERD (gastroesophageal reflux disease)    Hypertension    Legally blind in left eye, as defined in [place]    PTSD (post-traumatic stress disorder)    PVD (peripheral vascular disease) (HCC) 01/17/2015   Renal disorder    Sleep apnea    Thyroid disease    Past Surgical History:  Procedure Laterality Date   ABDOMINAL AORTAGRAM N/A 01/21/2015   Procedure: ABDOMINAL Ronny Flurry;  Surgeon: Chuck Hint, MD;  Location: [facility] CATH LAB;  Service: Cardiovascular;  Laterality: N/A;  ABDOMINAL AORTOGRAM W/LOWER EXTREMITY Right 06/15/2023   Procedure: ABDOMINAL AORTOGRAM W/LOWER EXTREMITY;  Surgeon: Nada Libman, MD;  Location: [facility] INVASIVE CV LAB;  Service: Cardiovascular;  Laterality: Right;   AMPUTATION Left 01/23/2015   Procedure: FIRST RAY  AMPUTATION/LEFT FOOT;  Surgeon: Nadara Mustard, MD;  Location: [facility] OR;  Service: Orthopedics;  Laterality: Left;   AMPUTATION Left 02/20/2015   Procedure: AMPUTATION BELOW KNEE;  Surgeon: Nadara Mustard, MD;  Location: [facility] OR;  Service: Orthopedics;  Laterality: Left;   AMPUTATION Right 06/18/2023   Procedure: RIGHT BELOW KNEE AMPUTATION;  Surgeon: Nadara Mustard, MD;  Location: [facility] OR;  Service: Orthopedics;  Laterality: Right;   AV FISTULA PLACEMENT     BALLOON DILATION N/A 02/26/2021   Procedure: BALLOON DILATION;  Surgeon: Hilarie Fredrickson, MD;  Location: [facility] ENDOSCOPY;  Service: Endoscopy;  Laterality: N/A;   BIOPSY  02/26/2021   Procedure: BIOPSY;  Surgeon: Hilarie Fredrickson, MD;  Location: [facility] ENDOSCOPY;  Service: Endoscopy;;   COLONOSCOPY     COMBINED KIDNEY-PANCREAS TRANSPLANT Left 2009   ESOPHAGOGASTRODUODENOSCOPY (EGD) WITH PROPOFOL N/A 02/26/2021   Procedure: ESOPHAGOGASTRODUODENOSCOPY (EGD) WITH PROPOFOL;  Surgeon: Hilarie Fredrickson, MD;  Location: [facility] ENDOSCOPY;  Service: Endoscopy;  Laterality: N/A;   INCISION AND DRAINAGE ABSCESS Left 01/13/2015   Procedure: INCISION AND DRAINAGE OF LEFT FOOT FIRST RAY ABCESS;  Surgeon: Kathryne Hitch, MD;  Location: [facility] OR;  Service: Orthopedics;  Laterality: Left;   LOWER EXTREMITY ANGIOGRAM Left 01/21/2015   Procedure: LOWER EXTREMITY ANGIOGRAM;  Surgeon: Chuck Hint, MD;  Location: [facility] CATH LAB;  Service: Cardiovascular;  Laterality: Left;   PERIPHERAL VASCULAR BALLOON ANGIOPLASTY Right 06/15/2023   Procedure: PERIPHERAL VASCULAR BALLOON ANGIOPLASTY;  Surgeon: Nada Libman, MD;  Location: [facility] INVASIVE CV LAB;  Service: Cardiovascular;  Laterality: Right;  right AT   Family History  Problem Relation Age of Onset   Hypertension Other    Social History:  reports that he has quit smoking. He has never used smokeless tobacco. He reports current alcohol use. He reports current drug use. Drug: Marijuana. Allergies: No Known Allergies Medications Prior to  Admission  Medication Sig Dispense Refill   amLODipine (NORVASC) 10 MG tablet Take 10 mg by mouth daily.     ascorbic acid (VITAMIN C) 250 MG tablet Take 250 mg by mouth once a week. Take 1 tablet by mouth on Monday, Wednesday, and Friday for iron absorption.     aspirin EC 81 MG tablet Take 81 mg by mouth daily.     atorvastatin (LIPITOR) 80 MG tablet Take 40 mg by mouth at bedtime.      cyclobenzaprine (FLEXERIL) 5 MG tablet Take 1 tablet (5 mg total) by mouth 2 (two) times daily as needed for muscle spasms. 10 tablet 0   doxycycline (VIBRA-TABS) 100 MG tablet Take 1 tablet (100 mg total) by mouth 2 (two) times daily. 28 tablet 0   ergocalciferol (VITAMIN D2) 1.25 MG (50000 UT) capsule Take 50,000 Units by mouth every Wednesday.     ferrous sulfate 325 (65 FE) MG tablet Take 325 mg by mouth once a week. Take 1 tablet by mouth on Monday, Wednesday, and Friday     furosemide (LASIX) 20 MG tablet Take 1 tablet (20 mg total) by mouth daily. 30 tablet 2   insulin glargine (LANTUS SOLOSTAR) 100 UNIT/ML Solostar Pen Inject 28 Units into the skin daily. 30 mL 3   labetalol (NORMODYNE) 100 MG tablet Take 2 tablets (200  mg total) by mouth 2 (two) times daily. 60 tablet 0   lisinopril (PRINIVIL,ZESTRIL) 10 MG tablet Take 1 tablet (10 mg total) by mouth daily. 30 tablet 0   losartan (COZAAR) 50 MG tablet Take 50 mg by mouth daily.     metFORMIN (GLUCOPHAGE-XR) 500 MG 24 hr tablet Take 1 tablet (500 mg total) by mouth 2 (two) times daily. 180 tablet 3   mycophenolate (CELLCEPT) 250 MG capsule Take 1,000 mg by mouth 2 (two) times daily.     pantoprazole (PROTONIX) 40 MG tablet Take 1 tablet (40 mg total) by mouth daily. 30 tablet 3   potassium chloride 20 MEQ TBCR Take 20 mEq by mouth daily. 7 tablet 0   tacrolimus (PROGRAF) 5 MG capsule Take 5 mg by mouth 2 (two) times daily.     traZODone (DESYREL) 50 MG tablet Take 50 mg by mouth at bedtime as needed for sleep.     sildenafil (VIAGRA) 50 MG tablet Take  50 mg by mouth as needed.        Home: Home Living Family/patient expects to be discharged to:: Private residence Living Arrangements: Spouse/significant other, Other relatives (mother-in-law) Available Help at Discharge: Family, Available 24 hours/day Type of Home: Apartment Home Access: Level entry Entrance Stairs-Number of Steps: 1 Home Layout: One level Bathroom Shower/Tub: Engineer, manufacturing systems: Standard Bathroom Accessibility: Yes Home Equipment: Cane - single point, Shower seat, Grab bars - toilet, Grab bars - tub/shower, BSC/3in1, Firefighter  Lives With: Spouse, Family   Functional History: Prior Function Prior Level of Function : Independent/Modified Independent Mobility Comments: At baseline, pt wears prosthesis every day, ambulates with SPC. Since admitted, pt has been light assist for transfers ADLs Comments: At baseline pt is Independent with ADLs.  Functional Status:  Mobility: Bed Mobility Overal bed mobility: Needs Assistance Bed Mobility: Supine to Sit Supine to sit: Modified independent (Device/Increase time) Sit to supine: Mod assist, +2 for physical assistance General bed mobility comments: Posterior scooting with min A, cues for hand placement and head turning to assist pt with lift offs and navigation. Repositioned to long sitting with CGA Transfers Overall transfer level: Needs assistance Equipment used: None Transfers: Bed to chair/wheelchair/BSC Sit to Stand: Supervision, Contact guard assist Bed to/from chair/wheelchair/BSC transfer type:: Squat pivot Squat pivot transfers: Total assist Step pivot transfers: Contact guard assist  Lateral/Scoot Transfers: Contact guard assist, Supervision General transfer comment: Pt was able to scotter laterally at EOB with supervision and then when scooting from bed to chair was CGA just to ensure clearing of chair to ensure no skin tear Ambulation/Gait General Gait Details: deferred for safety  due to lack of +2 for chair follow and slight dizziness Pre-gait activities: marching    ADL: ADL Overall ADL's : Needs assistance/impaired Eating/Feeding: Sitting, Set up Eating/Feeding Details (indicate cue type and reason): due to vision Grooming: Wash/dry face, Wash/dry hands, Set up, Sitting Upper Body Bathing: Contact guard assist, Sitting Upper Body Bathing Details (indicate cue type and reason): simulated Lower Body Bathing: Minimal assistance, Cueing for safety, Cueing for sequencing, Sitting/lateral leans Lower Body Bathing Details (indicate cue type and reason): simulated Upper Body Dressing : Contact guard assist, Cueing for safety, Cueing for sequencing, Sitting Lower Body Dressing: Minimal assistance, Sitting/lateral leans Lower Body Dressing Details (indicate cue type and reason): long sitting Toilet Transfer: Minimal assistance, Anterior/posterior Toilet Transfer Details (indicate cue type and reason): increased time needed to perform, two 1 min rest breaks due to fatigue. Toileting- Clothing Manipulation and  Hygiene: Sitting/lateral lean, Contact guard assist Toileting - Clothing Manipulation Details (indicate cue type and reason): Educated pt on lateral leans to aid in wiping self, recommended pt have someone hold BSC while leaning for safety. OT held [facility] in place during session Functional mobility during ADLs: Minimal assistance, +2 for safety/equipment, Rolling walker (2 wheels) General ADL Comments: using reverse push ups to transfer to bed to chair to complete mobility at this time  Cognition: Cognition Overall Cognitive Status: Within Functional Limits for tasks assessed Arousal/Alertness: Awake/alert Orientation Level: Oriented X4 Attention: Sustained Sustained Attention: Appears intact Memory: Impaired Memory Impairment: Retrieval deficit Awareness: Appears intact Problem Solving: Impaired Problem Solving Impairment: Verbal basic Cognition Arousal:  Alert Behavior During Therapy: WFL for tasks assessed/performed Overall Cognitive Status: Within Functional Limits for tasks assessed General Comments: AAOx4 and able to follow 1-step commands consistantly. Emergent awareness and Sustained attention. Pt can be tangential in conversation and requires occasional cues to maintain attention to task. Decreased safety awareness and decreased insight into deficits.  Physical Exam: Blood pressure (!) 119/46, pulse 72, temperature 98 F (36.7 C), resp. rate 18, height 5\' 10"  (1.778 m), weight 72.1 kg, SpO2 99%. Physical Exam Constitutional:      General: He is in acute distress.  HENT:     Head: Normocephalic.     Nose: Nose normal.     Mouth/Throat:     Mouth: Mucous membranes are moist.  Eyes:     Pupils: Pupils are equal, round, and reactive to light.  Cardiovascular:     Rate and Rhythm: Normal rate and regular rhythm.     Heart sounds: No murmur heard.    No gallop.  Pulmonary:     Effort: Pulmonary effort is normal. No respiratory distress.     Breath sounds: Normal breath sounds. No wheezing.  Abdominal:     General: Bowel sounds are normal. There is no distension.     Palpations: Abdomen is soft.  Musculoskeletal:     Cervical back: Normal range of motion.     Comments: Right BK stump in KI, vac attached, no drainage in cannister  Skin:    Comments: Left residual limb intact and appropriately shaped  Neurological:     Mental Status: He is alert.     Comments: Alert and oriented x 3. Normal insight and awareness. Intact Memory. Normal language and speech. Cranial nerve exam unremarkable although pt is blind in his left eye. MMT: RUE 5/5, 4+/5 LUE. RLE limited by pain. LLE 4/5 HF, KE. Sensory exam normal for light touch and pain in all 4 limbs. No limb ataxia or cerebellar signs. No abnormal tone appreciated.  Marland Kitchen    Psychiatric:     Comments: Pt upset due to ongoing pain in right leg.     Results for orders placed or performed  during the hospital encounter of 06/06/23 (from the past 48 hour(s))  Glucose, capillary     Status: Abnormal   Collection Time: 06/27/23 11:56 AM  Result Value Ref Range   Glucose-Capillary 150 (H) 70 - 99 mg/dL    Comment: Glucose reference range applies only to samples taken after fasting for at least 8 hours.   Comment 1 Notify RN   Glucose, capillary     Status: Abnormal   Collection Time: 06/27/23  4:02 PM  Result Value Ref Range   Glucose-Capillary 265 (H) 70 - 99 mg/dL    Comment: Glucose reference range applies only to samples taken after fasting for at least  8 hours.   Comment 1 Notify RN   Basic metabolic panel     Status: Abnormal   Collection Time: 06/28/23  3:34 AM  Result Value Ref Range   Sodium 135 135 - 145 mmol/L   Potassium 4.8 3.5 - 5.1 mmol/L   Chloride 98 98 - 111 mmol/L   CO2 23 22 - 32 mmol/L   Glucose, Bld 114 (H) 70 - 99 mg/dL    Comment: Glucose reference range applies only to samples taken after fasting for at least 8 hours.   BUN 15 8 - 23 mg/dL   Creatinine, Ser 1.61 0.61 - 1.24 mg/dL   Calcium 9.4 8.9 - 09.6 mg/dL   GFR, Estimated >04 >54 mL/min    Comment: (NOTE) Calculated using the CKD-EPI Creatinine Equation (2021)    Anion gap 14 5 - 15    Comment: Performed at [facility], [address], [place], [place] [address]  Glucose, capillary     Status: Abnormal   Collection Time: 06/28/23 12:23 PM  Result Value Ref Range   Glucose-Capillary 107 (H) 70 - 99 mg/dL    Comment: Glucose reference range applies only to samples taken after fasting for at least 8 hours.  Glucose, capillary     Status: Abnormal   Collection Time: 06/28/23  3:55 PM  Result Value Ref Range   Glucose-Capillary 167 (H) 70 - 99 mg/dL    Comment: Glucose reference range applies only to samples taken after fasting for at least 8 hours.  Glucose, capillary     Status: Abnormal   Collection Time: 06/28/23  7:50 PM  Result Value Ref Range   Glucose-Capillary 229 (H) 70  - 99 mg/dL    Comment: Glucose reference range applies only to samples taken after fasting for at least 8 hours.  Glucose, capillary     Status: Abnormal   Collection Time: 06/29/23 12:49 AM  Result Value Ref Range   Glucose-Capillary 207 (H) 70 - 99 mg/dL    Comment: Glucose reference range applies only to samples taken after fasting for at least 8 hours.  Glucose, capillary     Status: Abnormal   Collection Time: 06/29/23  8:16 AM  Result Value Ref Range   Glucose-Capillary 164 (H) 70 - 99 mg/dL    Comment: Glucose reference range applies only to samples taken after fasting for at least 8 hours.   Comment 1 Document in Chart    No results found.    Blood pressure (!) 119/46, pulse 72, temperature 98 F (36.7 C), resp. rate 18, height 5\' 10"  (1.778 m), weight 72.1 kg, SpO2 99%.  Medical Problem List and Plan: 1. Functional deficits secondary to right BKA 9/20 (hx of left BKA) with postop cerebral infarct in the right centrum semiovale  -patient may not yet shower  -ELOS/Goals: 7-10 days, goals mod I to supervision  -I contacted [facility] today about his left BKA. Someone put a prosthetic sock inside the socket of his prosthesis and then forced the pin-liner into the lock. Now the pin-liner can't be removed. They will come tomorrow to see if they can help remove the pin. Prosthesis might need to be taken apart.  2.  Antithrombotics: -DVT/anticoagulation:  Pharmaceutical: Lovenox  -antiplatelet therapy: Aspirin and Plavix for three weeks followed by aspirin alone.  Stop date for Plavix 10/10  3. Postop and phantom limb pain RLE: pt with severe pain this afternoon -Tylenol, oxycodone as needed -add gabapentin for phantom limb pain,  100mg  TID  4. Mood/Behavior/Sleep: LCSW to evaluate and provide emotional support  -antipsychotic agents: n/a  -continue trazodone 50 mg prn sleep  5. Neuropsych/cognition: This patient is capable of making decisions on his own behalf.  6.  Skin/Wound Care: Continue with vac and shrinker  -f/u with Dr. Lajoyce Corners as outpt   7. Fluids/Electrolytes/Nutrition: Routine Is and Os and follow-up chemistries  -continue zinc, Vit D, C supplements  -carb modified diet  8: Hypertension: monitor TID and prn  -continue amlodipine 5 mg daily  -continue losartan 50 mg daily  9: Hyperlipidemia: continue statin   11: DM: A1c = 11.1%; CBGs QID  -continue SSI  -continue Semglee 7 units daily  12: s/p kidney/pancreas transplant: continue Cellcept, Prograf  -[facility]  13: Dysphagia/possible Candida esophagitis/GERD:  -continue Diflucan for a total of 10 days therapy  -continue Reglan TID  -continue Protonix BID  -follow-up GI as outpatient  14: Legally blind left eye         Milinda Antis, PA-C 06/29/2023

## 2023-06-30 ENCOUNTER — Inpatient Hospital Stay (HOSPITAL_COMMUNITY): Payer: Medicare HMO

## 2023-06-30 DIAGNOSIS — R609 Edema, unspecified: Secondary | ICD-10-CM

## 2023-06-30 LAB — CBC WITH DIFFERENTIAL/PLATELET
Abs Immature Granulocytes: 0.12 10*3/uL — ABNORMAL HIGH (ref 0.00–0.07)
Basophils Absolute: 0 10*3/uL (ref 0.0–0.1)
Basophils Relative: 1 %
Eosinophils Absolute: 0.2 10*3/uL (ref 0.0–0.5)
Eosinophils Relative: 3 %
HCT: 27.6 % — ABNORMAL LOW (ref 39.0–52.0)
Hemoglobin: 8.6 g/dL — ABNORMAL LOW (ref 13.0–17.0)
Immature Granulocytes: 2 %
Lymphocytes Relative: 24 %
Lymphs Abs: 1.9 10*3/uL (ref 0.7–4.0)
MCH: 24.9 pg — ABNORMAL LOW (ref 26.0–34.0)
MCHC: 31.2 g/dL (ref 30.0–36.0)
MCV: 79.8 fL — ABNORMAL LOW (ref 80.0–100.0)
Monocytes Absolute: 0.6 10*3/uL (ref 0.1–1.0)
Monocytes Relative: 7 %
Neutro Abs: 5 10*3/uL (ref 1.7–7.7)
Neutrophils Relative %: 63 %
Platelets: 264 10*3/uL (ref 150–400)
RBC: 3.46 MIL/uL — ABNORMAL LOW (ref 4.22–5.81)
RDW: 14.2 % (ref 11.5–15.5)
WBC: 7.8 10*3/uL (ref 4.0–10.5)
nRBC: 0 % (ref 0.0–0.2)

## 2023-06-30 LAB — COMPREHENSIVE METABOLIC PANEL
ALT: 25 U/L (ref 0–44)
AST: 15 U/L (ref 15–41)
Albumin: 3 g/dL — ABNORMAL LOW (ref 3.5–5.0)
Alkaline Phosphatase: 89 U/L (ref 38–126)
Anion gap: 10 (ref 5–15)
BUN: 17 mg/dL (ref 8–23)
CO2: 25 mmol/L (ref 22–32)
Calcium: 9.5 mg/dL (ref 8.9–10.3)
Chloride: 101 mmol/L (ref 98–111)
Creatinine, Ser: 1.21 mg/dL (ref 0.61–1.24)
GFR, Estimated: >60 mL/min (ref >60)
Glucose, Bld: 154 mg/dL — ABNORMAL HIGH (ref 70–99)
Potassium: 5.2 mmol/L — ABNORMAL HIGH (ref 3.5–5.1)
Sodium: 136 mmol/L (ref 135–145)
Total Bilirubin: 0.3 mg/dL (ref 0.3–1.2)
Total Protein: 6.4 g/dL — ABNORMAL LOW (ref 6.5–8.1)

## 2023-06-30 LAB — GLUCOSE, CAPILLARY
Glucose-Capillary: 177 mg/dL — ABNORMAL HIGH (ref 70–99)
Glucose-Capillary: 242 mg/dL — ABNORMAL HIGH (ref 70–99)
Glucose-Capillary: 266 mg/dL — ABNORMAL HIGH (ref 70–99)
Glucose-Capillary: 223 mg/dL — ABNORMAL HIGH (ref 70–99)

## 2023-06-30 LAB — VITAMIN D 25 HYDROXY (VIT D DEFICIENCY, FRACTURES): Vit D, 25-Hydroxy: 40.74 ng/mL (ref 30–100)

## 2023-06-30 LAB — MAGNESIUM: Magnesium: 1.8 mg/dL (ref 1.7–2.4)

## 2023-06-30 MED ORDER — SODIUM ZIRCONIUM CYCLOSILICATE 5 G PO PACK
5.0000 g | PACK | Freq: Once | ORAL | Status: AC
  Administered 2023-06-30: 5 g via ORAL
  Filled 2023-06-30: qty 1

## 2023-06-30 MED ORDER — INSULIN GLARGINE-YFGN 100 UNIT/ML ~~LOC~~ SOLN
9.0000 [IU] | Freq: Every day | SUBCUTANEOUS | Status: DC
  Administered 2023-07-01: 9 [IU] via SUBCUTANEOUS
  Filled 2023-06-30: qty 0.09

## 2023-06-30 MED ORDER — MAGNESIUM SULFATE 2 GM/50ML IV SOLN
2.0000 g | Freq: Once | INTRAVENOUS | Status: AC
  Administered 2023-06-30: 2 g via INTRAVENOUS
  Filled 2023-06-30: qty 50

## 2023-06-30 MED ORDER — VITAMIN D 25 MCG (1000 UNIT) PO TABS
1000.0000 [IU] | ORAL_TABLET | Freq: Every day | ORAL | Status: DC
  Administered 2023-06-30 – 2023-07-01 (×2): 1000 [IU] via ORAL
  Filled 2023-06-30 (×2): qty 1

## 2023-06-30 MED ORDER — LOSARTAN POTASSIUM 25 MG PO TABS
25.0000 mg | ORAL_TABLET | Freq: Every day | ORAL | Status: DC
  Administered 2023-07-01 – 2023-07-08 (×8): 25 mg via ORAL
  Filled 2023-06-30 (×7): qty 1

## 2023-06-30 NOTE — Progress Notes (Signed)
Patient wound vac to be removed. Per MD Riley Kill, patient could wait until the morning if they preferred. Offered patient removal for right now or tomorrow, patient would like to wait until tomorrow. Wound vac intact. Patient in bed with call bell within reach.

## 2023-06-30 NOTE — Plan of Care (Signed)
  Problem: RH Cognition - SLP Goal: RH LTG Patient will demonstrate orientation with cues Description:  LTG:  Patient will demonstrate orientation to person/place/time/situation with cues (SLP)   Flowsheets (Taken 06/30/2023 1541) LTG Patient will demonstrate orientation to:  Person  Place  Time  Situation LTG: Patient will demonstrate orientation using cueing (SLP): Minimal Assistance - Patient > 75%   Problem: RH Expression Communication Goal: LTG Patient will increase speech intelligibility (SLP) Description: LTG: Patient will increase speech intelligibility at word/phrase/conversation level with cues, % of the time (SLP) Flowsheets (Taken 06/30/2023 1541) LTG: Patient will increase speech intelligibility (SLP): Minimal Assistance - Patient > 75% Level: Conversation level Percent of time patient will use intelligible speech: 90%   Problem: RH Problem Solving Goal: LTG Patient will demonstrate problem solving for (SLP) Description: LTG:  Patient will demonstrate problem solving for basic/complex daily situations with cues  (SLP) Flowsheets (Taken 06/30/2023 1541) LTG: Patient will demonstrate problem solving for (SLP): Basic daily situations LTG Patient will demonstrate problem solving for: Minimal Assistance - Patient > 75%   Problem: RH Memory Goal: LTG Patient will use memory compensatory aids to (SLP) Description: LTG:  Patient will use memory compensatory aids to recall biographical/new, daily complex information with cues (SLP) Flowsheets (Taken 06/30/2023 1541) LTG: Patient will use memory compensatory aids to (SLP): Minimal Assistance - Patient > 75%   Problem: RH Attention Goal: LTG Patient will demonstrate this level of attention during functional activites (SLP) Description: LTG:  Patient will will demonstrate this level of attention during functional activites (SLP) Flowsheets (Taken 06/30/2023 1541) Patient will demonstrate during cognitive/linguistic activities the  attention type of: Sustained Patient will demonstrate this level of attention during cognitive/linguistic activities in: Controlled LTG: Patient will demonstrate this level of attention during cognitive/linguistic activities with assistance of (SLP): Minimal Assistance - Patient > 75% Number of minutes patient will demonstrate attention during cognitive/linguistic activities: 15

## 2023-06-30 NOTE — Progress Notes (Signed)
Patient ID: [name]., male   DOB: 01-02-1951, 72 y.o.   MRN: [id number]  Team Conference Report to Patient/Family  Team Conference discussion was reviewed with the patient and caregiver, including goals, any changes in plan of care and target discharge date.  Patient and caregiver express understanding and are in agreement.  The patient has a target discharge date of 07/08/23.   SW met with patient and introduce self and explained role. Patient anticipates discharging home with his spouse. Pt reports a missing cell phone and has not received a FU. SLP evals pending. Patient pleased with current date. No additional questions or concerns.   [name] 06/30/2023, 2:33 PM

## 2023-06-30 NOTE — Patient Care Conference (Signed)
Inpatient RehabilitationTeam Conference and Plan of Care Update Date: 06/30/2023   Time: 11:51 AM    Patient Name: [name].      Medical Record Number: [id number]  Date of Birth: 07-10-1951 Sex: Male         Room/Bed: 4W14C/4W14C-01 Payor Info: Payor: [employer] MEDICARE / Plan: [employer] MEDICARE HMO / Product Type: *No Product type* /    Admit Date/Time:  06/29/2023  2:22 PM  Primary Diagnosis:  CVA (cerebral vascular accident) [facility])  Hospital Problems: Principal Problem:   CVA (cerebral vascular accident) [facility])    Expected Discharge Date: Expected Discharge Date: 07/08/23  Team Members Present: Physician leading conference: Dr. Sula Soda Social Worker Present: Lavera Guise, BSW Nurse Present: Chana Bode, RN;Other (comment) Janina Mayo) PT Present: Raechel Chute, PT OT Present: Candee Furbish, OT SLP Present: Jeannie Done, SLP     Current Status/Progress Goal Weekly Team Focus  Bowel/Bladder   continent of b/b; LBM: 10/1   remain continent of b/b   assist with toileting needs prn    Swallow/Nutrition/ Hydration   awaiting evaluation           ADL's   Min A UB, LB and toileting, mod A toilet transfer via SB and max A sit > stand with LLE prosthesis on with RW   Supervision   Barriers- endurance, problem solving, seated level care    Mobility   bed mobility supervision using bed features, AP transfers mod A   supervision  barriers: improper fitting prosthetic, pain, global weakness/deconditioning, and DME    Communication   awaiting evaluation            Safety/Cognition/ Behavioral Observations  awaiting evaluation            Pain   c/o pain to RLE   pain level <5/10   assess pain QS and prn    Skin   BKA RLE, wound vac in place   remain free of new skin breakdown/infection  assess skin QS and prn      Discharge Planning:    Evals pending  Team Discussion: Patient post CVA limited by prosthetic fitting issues,  pain and weakness with poor endurance. Patient with a flat affect is hyper-verbose but can be kurt at times.  Patient on target to meet rehab goals: Currently needs min assist for ADLs and toileting with mod assist for toilet transfers using a slide-board. Evals pending. Goals for discharge set for supervision overall at a wheelchair level.  *See Care Plan and progress notes for long and short-term goals.   Revisions to Treatment Plan:  N/a   Teaching Needs: Safety, medications/insulin, skin care, transfers, toileting, etc.   Current Barriers to Discharge: Behavior  Possible Resolutions to Barriers: Family education HH follow up services DME: Drop arm BSC, and wheelchair     Medical Summary Current Status: hyperglycemic, BKA, fluctuating hypertension and hypotension, history of magnesium deficiency, neuropathy  Barriers to Discharge: Medical stability  Barriers to Discharge Comments: hyperglycemic, BKA, fluctuating hypertension and hypotension, history of magnesium deficiency, neuropathy Possible Resolutions to [employer] Focus: increase insulin by 1 U, provided dietary education, continue to monitor blood pressure TID, supplement 2 grams of IV mag, continue gabapentin   Continued Need for Acute Rehabilitation Level of Care: The patient requires daily medical management by a physician with specialized training in physical medicine and rehabilitation for the following reasons: Direction of a multidisciplinary physical rehabilitation program to maximize functional independence : Yes Medical management of patient stability for  increased activity during participation in an intensive rehabilitation regime.: Yes Analysis of laboratory values and/or radiology reports with any subsequent need for medication adjustment and/or medical intervention. : Yes   I attest that I was present, lead the team conference, and concur with the assessment and plan of the team.   Chana Bode  B 06/30/2023, 5:24 PM

## 2023-06-30 NOTE — Discharge Summary (Signed)
Physician Discharge Summary  Patient ID: [name]. MRN: [id number] DOB/AGE: [birth year] 70 y.o.  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 is a 72 y.o. male who presented to the ED on 06/06/2023 for acute onset of tremors and chills. He has had ongoing outpatient treatment for right plantar foot ulcer. Met SIRS criteria on admission. Given cefepime and admitted to hospitalist service. Cellcept and Prograf restarted for history of renal/pancreas transplantation 2009. Follows mostly at the [facility] with Dr. Arrie Aran. He has a PMH also of PVD and left BKA in 2016. Dr. Lajoyce Corners consulted. ABIs ordered and VVS consulted. He developed uncontrolled nausea and malaise on 9/12. Rapid response called 9/13. He developed headaches, dizziness and dysphagia and GI was consulted and planned EGD. Prior to procedure, he had significantly elevated blood pressure with diaphoresis. 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 Neurology was consulted. The  patient reported some visual difficulties and that he had been dropping objects with his left hands. Neurology concluded that his stroke was quite small and did not correlate with most of his deficits. He was started on aspirin 81 mg daily and pending clearance from GI and vascular surgery he was recommended to start DAPT for 3 weeks followed by aspirin alone. He underwent abdominal aortogram with bifemoral runoff and angioplasty of the right anterior tibial artery by Dr. Myra Gianotti on 9/17. No inline flow to the dorsalis pedis artery. Vascular surgery felt a ray amputation was reasonable to offer but that the likelihood of healing was low. Discussed with Dr. Lajoyce Corners. 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 EGD deferred due to stroke and barium swallow obtained. Barium swallow showed severe dysmotility and incomplete esophageal emptying with inability to pass a 13 mm tablet. Treated for opioid associated constipation and empiric treatment for esophageal Candidiasis. Dysphagia improved. He was able to complete bed mobility with supervision and lateral transfer to drop down chair to left side with contact-guard assist post set up of lines. Once in a sitting position he was able to complete bilateral lateral upper extremity hygiene and dressing.    Hospital Course: [name]. was admitted to rehab 06/29/2023 for inpatient therapies to consist of PT, ST and OT at least three hours five days a week. Past admission physiatrist, therapy team and rehab RN have worked together to provide customized collaborative inpatient rehab. Prevena dressing removed and shrinker sock placed. He completed Plavix course on 10/10. Phantom pain treated with gabapentin scheduled and oxycodone as needed. Treated  insomnia with trazodone. Completed Diflucan for possible Candida esophagitis. PPI and Regaln continued as per GI. Vitamin D increased to 4000 units daily.   Blood pressures were monitored on TID basis and amlodipine  5 mg daily and losartan 50 mg daily continued. Discontinued amlodipine and decreased losartan to 12.5 mg daily  Diabetes has been monitored with ac/hs CBG checks and SSI was use prn for tighter BS control.  Semglee 7 units daily continued. Increased to 13 units daily. Did not restart metformin.  Rehab course: During patient's stay in rehab weekly team conferences were held to monitor patient's progress, set goals and discuss barriers to discharge. At admission, patient required mod with mobility and with basic self-care skills. He has had improvement in activity tolerance, balance, postural control as well as ability to compensate for deficits. He has had improvement in functional use RUE/LUE  and RLE/LLE as well as improvement in awareness. Patient has met 7 of 9 long term goals due to improved activity tolerance, improved balance, ability to compensate for deficits, improved awareness, and improved coordination.  Patient to discharge at overall Supervision level.  Patient's care partner is independent to provide the necessary physical and cognitive assistance at discharge. Pt's wife did not attend education with OT, however present for PT therefore did receive education on pt's CLOF and recommendations for functional mobility and DME needs.    Home health PT/OT arranged.   Discharge disposition: 06-Home-Health Care Svc     Diet: carb modified  Special Instructions: No driving, alcohol consumption or tobacco use.  Recommend checking fingerstick blood sugars four (4) times daily and record. Bring this information with you to follow-up appointment with PCP.  Recommend daily BP measurement in same arm and record time of day. Bring this information with you to follow-up appointment with PCP.  Follow-up with [facility] regarding further continuation of Reglan/PPI.   Discharge Instructions     Ambulatory referral to Neurology   Complete by: As directed    An appointment is requested in  approximately: 4 weeks   Ambulatory referral to Physical Medicine Rehab   Complete by: As directed    Hospital follow-up   Discharge patient   Complete by: As directed    Discharge disposition: 06-Home-Health Care Svc   Discharge patient date: 07/08/2023      Allergies as of 07/08/2023   No Known Allergies      Medication List     STOP taking these medications    amLODipine 10 MG tablet Commonly known as: NORVASC   cyclobenzaprine 5 MG tablet Commonly known as: FLEXERIL   doxycycline 100 MG tablet Commonly known as: VIBRA-TABS   ergocalciferol 1.25 MG (50000 UT) capsule Commonly known as: VITAMIN D2   ferrous sulfate 325 (65 FE) MG tablet   furosemide 20 MG tablet Commonly known as: LASIX   labetalol 100 MG tablet Commonly known as: NORMODYNE   lisinopril 10 MG tablet Commonly known as: ZESTRIL   metFORMIN 500 MG 24 hr tablet Commonly known as: GLUCOPHAGE-XR   Potassium Chloride ER 20 MEQ Tbcr   sildenafil 50 MG tablet Commonly known as: VIAGRA       TAKE these medications    acetaminophen 325 MG tablet Commonly known as: TYLENOL Take 1-2 tablets (325-650 mg total) by mouth every 4 (four) hours as needed for mild pain.   ascorbic acid 1000 MG tablet Commonly known as: VITAMIN C Take 1 tablet (1,000 mg total) by mouth daily. What changed:  medication strength how much  to take when to take this additional instructions   aspirin EC 81 MG tablet Take 81 mg by mouth daily.   atorvastatin 80 MG tablet Commonly known as: LIPITOR Take 40 mg by mouth at bedtime.   gabapentin 100 MG capsule Commonly known as: NEURONTIN Take 1 capsule (100 mg total) by mouth 2 (two) times daily AND 2 capsules (200 mg total) at bedtime.   Lantus SoloStar 100 UNIT/ML Solostar Pen Generic drug: insulin glargine Inject 13 Units into the skin daily. What changed: how much to take   losartan 25 MG tablet Commonly known as: COZAAR Take 0.5 tablets (12.5 mg total)  by mouth daily. Start taking on: July 09, 2023 What changed:  medication strength how much to take   metoCLOPramide 5 MG tablet Commonly known as: REGLAN Take 1 tablet (5 mg total) by mouth 3 (three) times daily before meals.   mycophenolate 250 MG capsule Commonly known as: CELLCEPT Take 1,000 mg by mouth 2 (two) times daily.   oxyCODONE 5 MG immediate release tablet Commonly known as: Oxy IR/ROXICODONE Take 1 tablet (5 mg total) by mouth every 4 (four) hours as needed for severe pain.   pantoprazole 40 MG tablet Commonly known as: PROTONIX Take 1 tablet (40 mg total) by mouth daily.   tacrolimus 5 MG capsule Commonly known as: PROGRAF Take 5 mg by mouth 2 (two) times daily.   traZODone 50 MG tablet Commonly known as: DESYREL Take 50 mg by mouth at bedtime as needed for sleep.   vitamin D3 25 MCG tablet Commonly known as: CHOLECALCIFEROL Take 4 tablets (4,000 Units total) by mouth daily.        Follow-up Information     Nadene Rubins [name], MD Follow up.   Specialty: Internal Medicine Why: Call the office in 1-2 days to make arrangements for hospital follow-up appointment. Contact information: [address] [place] [address] [phone]         Horton Chin, MD Follow up.   Specialty: Physical Medicine and Rehabilitation Why: office will call you to arrange your appt (sent) Contact information: [address] [place] [address] [phone]         [facility] Follow up.   Why: Call the office in 1-2 days to make arrangements for hospital follow-up appointment. Contact information: [address]     [address] [place] [address] [phone]        Nadara Mustard, MD Follow up.   Specialty: Orthopedic Surgery Why: Call the office in 1-2 days to make arrangements for hospital follow-up appointment. Contact information: [address] [place] [address] [phone]          [facility]. Go to.   Specialty: Gastroenterology Why: Call in 1-2 days to make arrangements for hospital follow-up and continuation of Reglan. Contact information: [address] [place] [address] [phone]                Signed: Milinda Antis 07/08/2023, 10:23 AM

## 2023-06-30 NOTE — Evaluation (Signed)
Physical Therapy Assessment and Plan  Patient Details  Name: [name]. MRN: [id number] Date of Birth: [birth year]  PT Diagnosis: Abnormal posture, Abnormality of gait, Coordination disorder, Difficulty walking, Edema, Muscle weakness, and Pain in R residual limb Rehab Potential: Good ELOS: 7-10 days   Today's Date: 06/30/2023 PT Individual Time: 6295-2841 PT Individual Time Calculation (min): 71 min    Hospital Problem: Principal Problem:   CVA (cerebral vascular accident) [facility])   Past Medical History:  Past Medical History:  Diagnosis Date   Anxiety    COVID-19 virus infection 09/2020   CPAP (continuous positive airway pressure) dependence    Diabetes mellitus without complication (HCC)    GERD (gastroesophageal reflux disease)    Hypertension    Legally blind in left eye, as defined in [place]    PTSD (post-traumatic stress disorder)    PVD (peripheral vascular disease) (HCC) 01/17/2015   Renal disorder    Sleep apnea    Thyroid disease    Past Surgical History:  Past Surgical History:  Procedure Laterality Date   ABDOMINAL AORTAGRAM N/A 01/21/2015   Procedure: ABDOMINAL [name];  Surgeon: [name] Hint, MD;  Location: [facility] CATH LAB;  Service: Cardiovascular;  Laterality: N/A;   ABDOMINAL AORTOGRAM W/LOWER EXTREMITY Right 06/15/2023   Procedure: ABDOMINAL AORTOGRAM W/LOWER EXTREMITY;  Surgeon: [name] Libman, MD;  Location: [facility] INVASIVE CV LAB;  Service: Cardiovascular;  Laterality: Right;   AMPUTATION Left 01/23/2015   Procedure: FIRST RAY AMPUTATION/LEFT FOOT;  Surgeon: [name] Mustard, MD;  Location: [facility] OR;  Service: Orthopedics;  Laterality: Left;   AMPUTATION Left 02/20/2015   Procedure: AMPUTATION BELOW KNEE;  Surgeon: [name] Mustard, MD;  Location: [facility] OR;  Service: Orthopedics;  Laterality: Left;   AMPUTATION Right 06/18/2023   Procedure: RIGHT BELOW KNEE AMPUTATION;  Surgeon: [name] Mustard, MD;  Location: [facility] OR;  Service: Orthopedics;  Laterality: Right;    AV FISTULA PLACEMENT     BALLOON DILATION N/A 02/26/2021   Procedure: BALLOON DILATION;  Surgeon: [name] Fredrickson, MD;  Location: [facility] ENDOSCOPY;  Service: Endoscopy;  Laterality: N/A;   BIOPSY  02/26/2021   Procedure: BIOPSY;  Surgeon: [name] Fredrickson, MD;  Location: [facility] ENDOSCOPY;  Service: Endoscopy;;   COLONOSCOPY     COMBINED KIDNEY-PANCREAS TRANSPLANT Left 2009   ESOPHAGOGASTRODUODENOSCOPY (EGD) WITH PROPOFOL N/A 02/26/2021   Procedure: ESOPHAGOGASTRODUODENOSCOPY (EGD) WITH PROPOFOL;  Surgeon: [name] Fredrickson, MD;  Location: [facility] ENDOSCOPY;  Service: Endoscopy;  Laterality: N/A;   INCISION AND DRAINAGE ABSCESS Left 01/13/2015   Procedure: INCISION AND DRAINAGE OF LEFT FOOT FIRST RAY ABCESS;  Surgeon: [name] Hitch, MD;  Location: [facility] OR;  Service: Orthopedics;  Laterality: Left;   LOWER EXTREMITY ANGIOGRAM Left 01/21/2015   Procedure: LOWER EXTREMITY ANGIOGRAM;  Surgeon: [name] Hint, MD;  Location: [facility] CATH LAB;  Service: Cardiovascular;  Laterality: Left;   PERIPHERAL VASCULAR BALLOON ANGIOPLASTY Right 06/15/2023   Procedure: PERIPHERAL VASCULAR BALLOON ANGIOPLASTY;  Surgeon: [name] Libman, MD;  Location: [facility] INVASIVE CV LAB;  Service: Cardiovascular;  Laterality: Right;  right AT    Assessment & Plan Clinical Impression: Patient is a 72 y.o. year old male who presented to the ED on 06/06/2023 for acute onset of tremors and chills. He has had ongoing outpatient treatment for right plantar foot ulcer. Met SIRS criteria on admission. Given cefepime and admitted to hospitalist service. Cellcept and Prograf restarted for history of renal/pancreas transplantation 2009. Follows mostly at the [facility] with Dr.  Coladonato. He has a PMH also of PVD and left BKA in 2016. Dr. Lajoyce Corners consulted. ABIs ordered and VVS consulted. He developed uncontrolled nausea and malaise on 9/12. Rapid response called 9/13. He developed headaches, dizziness and dysphagia and GI was consulted and planned EGD.  Prior to procedure, he had significantly elevated blood pressure with diaphoresis. 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 Neurology was consulted. The patient reported some visual difficulties and that he had been dropping objects with his left hands. Neurology concluded that his stroke was quite small and did not correlate with most of his deficits. He was started on aspirin 81 mg daily and pending clearance from GI and vascular surgery he was recommended to start DAPT for 3 weeks followed by aspirin alone. He underwent abdominal aortogram with bifemoral runoff and angioplasty of the right anterior tibial artery by Dr. Myra [name] on 9/17. No inline flow to the dorsalis pedis artery. Vascular surgery felt a ray amputation was reasonable to offer but that the likelihood of healing was low. Discussed with Dr. Lajoyce Corners. 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 EGD deferred due to stroke and barium swallow obtained. Barium swallow showed severe dysmotility and incomplete esophageal emptying with inability to pass a 13 mm tablet. Treated for opioid associated constipation and empiric treatment for esophageal Candidiasis. Dysphagia improved. He was able to complete bed mobility with supervision and lateral transfer to drop down chair to left side with contact-guard assist post set up of lines. Once in a sitting position he was able to complete bilateral lateral upper extremity hygiene and dressing.The patient requires inpatient medicine and rehabilitation evaluations and services for ongoing dysfunction secondary to lacunar infarct and right BKA.   Patient currently requires mod with mobility secondary to muscle weakness and muscle joint tightness, decreased cardiorespiratoy endurance, field cut, and decreased sitting balance, decreased standing balance, decreased postural control, decreased balance strategies, and difficulty maintaining  precautions.  Prior to hospitalization, patient was modified independent  with mobility and lived with Spouse, Family (lives with wife and mother in [place]) in a Apartment home.  Home access is curb from parking lotLevel entry.  Patient will benefit from skilled PT intervention to maximize safe functional mobility, minimize fall risk, and decrease caregiver burden for planned discharge home with 24 hour supervision.  Anticipate patient will benefit from follow up HH at discharge.  PT - End of Session Activity Tolerance: Tolerates 30+ min activity with multiple rests Endurance Deficit: Yes Endurance Deficit Description: required rest break after transfer PT Assessment Rehab Potential (ACUTE/IP ONLY): Good PT Barriers to Discharge: Decreased caregiver support;Wound Care;Weight bearing restrictions;Other (comments) PT Barriers to Discharge Comments: pain, wife works during the day, wound care, improper fitting prosthetic PT Patient demonstrates impairments in the following area(s): Balance;Edema;Endurance;Motor;Pain;Nutrition;Skin Integrity PT Transfers Functional Problem(s): Bed Mobility;Bed to Chair;Car;Furniture PT Locomotion Functional Problem(s): Ambulation;Wheelchair Mobility;Stairs PT Plan PT Intensity: Minimum of 1-2 x/day ,45 to 90 minutes PT Frequency: 5 out of 7 days PT Duration Estimated Length of Stay: 7-10 days PT Treatment/Interventions: Discharge planning;Functional mobility training;Psychosocial support;Therapeutic Activities;Visual/perceptual remediation/compensation;Balance/vestibular training;Disease management/prevention;Neuromuscular re-education;Skin care/wound management;Therapeutic Exercise;Wheelchair propulsion/positioning;Cognitive remediation/compensation;DME/adaptive equipment instruction;Splinting/orthotics;UE/LE Strength taining/ROM;Pain management;Community reintegration;Functional electrical stimulation;Patient/family education;Stair training;UE/LE Coordination  activities PT Transfers Anticipated Outcome(s): supervision with LRAD PT Locomotion Anticipated Outcome(s): N/A PT Recommendation Follow Up Recommendations: Home health PT Patient destination: Home Equipment Recommended: To be determined Equipment Details: will need WC and RW   PT Evaluation Precautions/Restrictions Precautions Precautions: Fall  Precaution Comments: wound vac, legally blind in Lt eye, fistua RUE Required Braces or Orthoses: Other Brace Other Brace: RLE limb gaurd, LLE prosthesis (improper fitting) Restrictions Weight Bearing Restrictions: Yes RLE Weight Bearing: Non weight bearing LLE Weight Bearing: Weight bearing as tolerated (with prosthetic) Pain Interference Pain Interference Pain Effect on Sleep: 1. Rarely or not at all Pain Interference with Therapy Activities: 1. Rarely or not at all Pain Interference with Day-to-Day Activities: 1. Rarely or not at all Home Living/Prior Functioning Home Living Available Help at Discharge: Family;Available 24 hours/day (wife works full time day shift, and MIL is 70 y.o. reportedly so unsure about options for physical assist) Type of Home: Apartment Home Access: Level entry Entrance Stairs-Number of Steps: curb from parking lot Home Layout: One level Bathroom Shower/Tub: Tub/shower unit;Curtain Bathroom Toilet: Handicapped height Bathroom Accessibility: Yes Additional Comments: pt reports being independent with SPC and L prosthetic. Pt reports current prosthetic needs to be "fixed", stating that gel liner is "stuck" inside socket  Lives With: Spouse;Family (lives with wife and mother in [place]) Prior Function Level of Independence: Requires assistive device for independence Driving: No Vision/Perception  Vision - History Ability to See in Adequate Light: 1 Impaired Perception Perception: Impaired Preception Impairment Details: Spatial orientation Perception-Other Comments: impaired due to low vision Praxis Praxis:  WFL  Cognition Overall Cognitive Status: Within Functional Limits for tasks assessed Arousal/Alertness: Awake/alert Orientation Level: Oriented X4 Memory: Appears intact Awareness: Appears intact Problem Solving: Impaired Safety/Judgment: Appears intact Sensation Sensation Light Touch: Appears Intact Proprioception: Impaired by gross assessment Additional Comments: pt reports "coldness" phantom sensation in R residual limb Coordination Gross Motor Movements are Fluid and Coordinated: No Fine Motor Movements are Fluid and Coordinated: No Coordination and Movement Description: altered balance strategies due to new R BKA and prior L BKA with improper fitting prosthetic Finger Nose Finger Test: dysmetria bilaterally Heel Shin Test: unable to perform due to bilateral BKA Motor  Motor Motor: Abnormal postural alignment and control Motor - Skilled Clinical Observations: altered balance strategies due to new R BKA and prior L BKA with improper fitting prosthetic  Trunk/Postural Assessment  Cervical Assessment Cervical Assessment: Within Functional Limits Thoracic Assessment Thoracic Assessment: Exceptions to [facility] (mild kyphosis) Lumbar Assessment Lumbar Assessment: Exceptions to [facility] (posterior pelvic tilt) Postural Control Postural Control: Deficits on evaluation Righting Reactions: delayed Protective Responses: delayed  Balance Balance Balance Assessed: Yes Static Sitting Balance Static Sitting - Balance Support: Feet unsupported;No upper extremity supported Static Sitting - Level of Assistance: 5: Stand by assistance (supervision) Dynamic Sitting Balance Dynamic Sitting - Balance Support: No upper extremity supported;Feet unsupported Dynamic Sitting - Level of Assistance: 5: Stand by assistance (supervision) Extremity Assessment  RLE Assessment RLE Assessment: Not tested General Strength Comments: grossly 3/5 LLE Assessment LLE Assessment: Not tested General Strength  Comments: grossly 3+/5  Care Tool Care Tool Bed Mobility Roll left and right activity   Roll left and right assist level: Supervision/Verbal cueing    Sit to lying activity   Sit to lying assist level: Supervision/Verbal cueing    Lying to sitting on side of bed activity   Lying to sitting on side of bed assist level: the ability to move from lying on the back to sitting on the side of the bed with no back support.: Supervision/Verbal cueing     Care Tool Transfers Sit to stand transfer   Sit to stand assist level: Maximal Assistance - Patient 25 - 49%    Chair/bed transfer   Chair/bed transfer  assist level: Moderate Assistance - Patient 50 - 74% (PA)     Toilet transfer   Assist Level: Moderate Assistance - Patient 50 - 74% (slideboard)    Licensed conveyancer transfer activity did not occur: Safety/medical concerns        Care Tool Locomotion Ambulation Ambulation activity did not occur: Safety/medical concerns (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)        Walk 10 feet activity Walk 10 feet activity did not occur: Safety/medical concerns (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)       Walk 50 feet with 2 turns activity Walk 50 feet with 2 turns activity did not occur: Safety/medical concerns (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)      Walk 150 feet activity Walk 150 feet activity did not occur: Safety/medical concerns (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)      Walk 10 feet on uneven surfaces activity Walk 10 feet on uneven surfaces activity did not occur: Safety/medical concerns (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)      Stairs Stair activity did not occur: Safety/medical concerns (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)        Walk up/down 1 step activity Walk up/down 1 step or curb (drop down) activity did not occur: Safety/medical concerns (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)       Walk up/down 4 steps activity Walk up/down 4 steps activity did not occur: Safety/medical concerns (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)      Walk up/down 12 steps activity Walk up/down 12 steps activity did not occur: Safety/medical concerns (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)      Pick up small objects from floor Pick up small object from the floor (from standing position) activity did not occur: Safety/medical concerns (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)      Wheelchair Is the patient using a wheelchair?: Yes Type of Wheelchair: Manual Wheelchair activity did not occur: Safety/medical concerns      Wheel 50 feet with 2 turns activity Wheelchair 50 feet with 2 turns activity did not occur: Safety/medical concerns    Wheel 150 feet activity Wheelchair 150 feet activity did not occur: Safety/medical concerns      Refer to Care Plan for Long Term Goals  SHORT TERM GOAL WEEK 1 PT Short Term Goal 1 (Week 1): STG=LTG due to LOS  Recommendations for other services: None   Skilled Therapeutic Intervention Evaluation completed (see details above and below) with education on PT POC and goals and individual treatment initiated with focus on functional mobility/transfers, generalized strengthening and endurance, dynamic sitting balance/coordination, and limb loss education. Received pt semi-reclined in bed, pt educated on PT evaluation, CIR policies, and therapy schedule and agreeable. Pt reported pain 4/10 in R residual limb - declined pain medications. Pt reports current prosthetic needs to be "fixed" and gel liner is "stuck" inside socket - [employer] notified. Provided pt with 16x16 manual WC, Roho cushion, R amputee support pad, and L elevating legrest while RN administered medications.   Provided pt with scrub pants and donned pants in supine with mod A and assist to thread wound vac through - plan for wound vac removal today. Pt transferred  semi-reclined<>long sitting in bed with supervision. Donned limb guard with max A and pt performed AP transfer into WC with mod A to clean gap between bed and WC. Build up R legrest with towel and coband and pt  washed face with set up assist. Wife and MD arrived at end of session and discussed equipment. Pt does not have RW, WC, or any bathroom equipment. Placed order for 16x16 manual WC and discussed having pt's wife purchase RW privately at reduced cost. Concluded session with pt sitting in WC, needs within reach, and chair pad alarm on. Safety plan updated.   Mobility Bed Mobility Bed Mobility: Rolling Right;Rolling Left;Sit to Supine;Supine to Sit Rolling Right: Supervision/verbal cueing Rolling Left: Supervision/Verbal cueing Supine to Sit: Supervision/Verbal cueing Sit to Supine: Supervision/Verbal cueing Transfers Transfers: Anterior-Posterior Transfer;Sit to Stand Sit to Stand: Maximal Assistance - Patient 25-49% Anterior-Posterior Transfer: Moderate Assistance - Patient 50-74% Locomotion  Gait Ambulation: No Gait Gait: No Stairs / Additional Locomotion Stairs: No Wheelchair Mobility Wheelchair Mobility: No   Discharge Criteria: Patient will be discharged from PT if patient refuses treatment 3 consecutive times without medical reason, if treatment goals not met, if there is a change in medical status, if patient makes no progress towards goals or if patient is discharged from hospital.  The above assessment, treatment plan, treatment alternatives and goals were discussed and mutually agreed upon: by patient and by family  Huntley Dec PT, DPT 06/30/2023, 12:10 PM

## 2023-06-30 NOTE — Discharge Instructions (Addendum)
Inpatient Rehab Discharge Instructions  [name]. Discharge date and time:  07/08/2023  Activities/Precautions/ Functional Status: Activity: no lifting, driving, or strenuous exercise until cleared by MD Diet: renal diet Wound Care: keep wound clean and dry Functional status:  ___ No restrictions     ___ Walk up steps independently _x__ 24/7 supervision/assistance   ___ Walk up steps with assistance ___ Intermittent supervision/assistance  ___ Bathe/dress independently ___ Walk with walker     ___ Bathe/dress with assistance ___ Walk Independently    ___ Shower independently ___ Walk with assistance    _x__ Shower with assistance _x__ No alcohol     ___ Return to work/school ________  Special Instructions: No driving, alcohol consumption or tobacco use.  Recommend checking fingerstick blood sugars four (4) times daily and record. Bring this information with you to follow-up appointment with PCP.  Recommend daily BP measurement in same arm and record time of day. Bring this information with you to follow-up appointment with PCP.   COMMUNITY REFERRALS UPON DISCHARGE:    Home Health:   PT     OT                  Agency: [facility]  [phone]    Medical Equipment/Items Ordered: Wheelchair, [employer], Tub Transfer bench, Slideboard                                                 Agency/Supplier: [employer] [phone]    STROKE/TIA DISCHARGE INSTRUCTIONS SMOKING Cigarette smoking nearly doubles your risk of having a stroke & is the single most alterable risk factor  If you smoke or have smoked in the last 12 months, you are advised to quit smoking for your health. Most of the excess cardiovascular risk related to smoking disappears within a year of stopping. Ask you doctor about anti-smoking medications [place] Quit Line: 1-800-QUIT NOW Free Smoking Cessation Classes [phone]  CHOLESTEROL Know your levels; limit fat & cholesterol in your diet  Lipid Panel      Component Value Date/Time   CHOL 130 06/12/2023 0336   TRIG 155 (H) 06/12/2023 0336   HDL 23 (L) 06/12/2023 0336   CHOLHDL 5.7 06/12/2023 0336   VLDL 31 06/12/2023 0336   LDLCALC 76 06/12/2023 0336     Many patients benefit from treatment even if their cholesterol is at goal. Goal: Total Cholesterol (CHOL) less than 160 Goal:  Triglycerides (TRIG) less than 150 Goal:  HDL greater than 40 Goal:  LDL (LDLCALC) less than 100   BLOOD PRESSURE American Stroke Association blood pressure target is less that 120/80 mm/Hg  Your discharge blood pressure is:  BP: (!) 138/57 Monitor your blood pressure Limit your salt and alcohol intake Many individuals will require more than one medication for high blood pressure  DIABETES (A1c is a blood sugar average for last 3 months) Goal HGBA1c is under 7% (HBGA1c is blood sugar average for last 3 months)  Diabetes: Diagnosis of diabetes:  Your A1c:11.1 %    Lab Results  Component Value Date   HGBA1C 11.1 (H) 06/07/2023    Your HGBA1c can be lowered with medications, healthy diet, and exercise. Check your blood sugar as directed by your physician Call your physician if you experience unexplained or low blood sugars.  PHYSICAL ACTIVITY/REHABILITATION Goal is 30 minutes at least 4 days  per week  Activity: Increase activity slowly, Therapies: Physical Therapy: Home Health, Occupational Therapy: Home Health, and Speech Therapy: Home Health Return to work: n/a Activity decreases your risk of heart attack and stroke and makes your heart stronger.  It helps control your weight and blood pressure; helps you relax and can improve your mood. Participate in a regular exercise program. Talk with your doctor about the best form of exercise for you (dancing, walking, swimming, cycling).  DIET/WEIGHT Goal is to maintain a healthy weight  Your discharge diet is:  Diet Order             Diet Carb Modified Fluid consistency: Thin; Room service appropriate? Yes   Diet effective now                  thin liquids Your height is:  Height: 4\' 8"  (142.2 cm) Your current weight is: Weight: 67.5 kg Your Body Mass Index (BMI) is:  BMI (Calculated): 33.38 Following the type of diet specifically designed for you will help prevent another stroke. Your goal weight range is:   Your goal Body Mass Index (BMI) is 19-24. Healthy food habits can help reduce 3 risk factors for stroke:  High cholesterol, hypertension, and excess weight.  RESOURCES Stroke/Support Group:  Call [phone]   STROKE EDUCATION PROVIDED/REVIEWED AND GIVEN TO PATIENT Stroke warning signs and symptoms How to activate emergency medical system (call 911). Medications prescribed at discharge. Need for follow-up after discharge. Personal risk factors for stroke. Pneumonia vaccine given: No Flu vaccine given: No My questions have been answered, the writing is legible, and I understand these instructions.  I will adhere to these goals & educational materials that have been provided to me after my discharge from the hospital.     My questions have been answered and I understand these instructions. I will adhere to these goals and the provided educational materials after my discharge from the hospital.  Patient/Caregiver Signature _______________________________ Date __________  Clinician Signature _______________________________________ Date __________  Please bring this form and your medication list with you to all your follow-up doctor's appointments.

## 2023-06-30 NOTE — Progress Notes (Signed)
Inpatient Rehabilitation  Patient information reviewed and entered into eRehab system by Demarrio Menges Isair Inabinet, OTR/L, Rehab Quality Coordinator.   Information including medical coding, functional ability and quality indicators will be reviewed and updated through discharge.   

## 2023-06-30 NOTE — Plan of Care (Signed)
  Problem: RH Balance Goal: LTG: Patient will maintain dynamic sitting balance (OT) Description: LTG:  Patient will maintain dynamic sitting balance with assistance during activities of daily living (OT) Flowsheets (Taken 06/30/2023 1212) LTG: Pt will maintain dynamic sitting balance during ADLs with: Supervision/Verbal cueing Goal: LTG Patient will maintain dynamic standing with ADLs (OT) Description: LTG:  Patient will maintain dynamic standing balance with assist during activities of daily living (OT)  Flowsheets (Taken 06/30/2023 1212) LTG: Pt will maintain dynamic standing balance during ADLs with: Minimal Assistance - Patient > 75%   Problem: Sit to Stand Goal: LTG:  Patient will perform sit to stand in prep for activites of daily living with assistance level (OT) Description: LTG:  Patient will perform sit to stand in prep for activites of daily living with assistance level (OT) Flowsheets (Taken 06/30/2023 1212) LTG: PT will perform sit to stand in prep for activites of daily living with assistance level: Minimal Assistance - Patient > 75%   Problem: RH Bathing Goal: LTG Patient will bathe all body parts with assist levels (OT) Description: LTG: Patient will bathe all body parts with assist levels (OT) Flowsheets (Taken 06/30/2023 1212) LTG: Pt will perform bathing with assistance level/cueing: Supervision/Verbal cueing   Problem: RH Dressing Goal: LTG Patient will perform upper body dressing (OT) Description: LTG Patient will perform upper body dressing with assist, with/without cues (OT). Flowsheets (Taken 06/30/2023 1212) LTG: Pt will perform upper body dressing with assistance level of: Set up assist Goal: LTG Patient will perform lower body dressing w/assist (OT) Description: LTG: Patient will perform lower body dressing with assist, with/without cues in positioning using equipment (OT) Flowsheets (Taken 06/30/2023 1212) LTG: Pt will perform lower body dressing with assistance  level of: Supervision/Verbal cueing   Problem: RH Toileting Goal: LTG Patient will perform toileting task (3/3 steps) with assistance level (OT) Description: LTG: Patient will perform toileting task (3/3 steps) with assistance level (OT)  Flowsheets (Taken 06/30/2023 1212) LTG: Pt will perform toileting task (3/3 steps) with assistance level: Supervision/Verbal cueing   Problem: RH Toilet Transfers Goal: LTG Patient will perform toilet transfers w/assist (OT) Description: LTG: Patient will perform toilet transfers with assist, with/without cues using equipment (OT) Flowsheets (Taken 06/30/2023 1212) LTG: Pt will perform toilet transfers with assistance level of: Supervision/Verbal cueing   Problem: RH Tub/Shower Transfers Goal: LTG Patient will perform tub/shower transfers w/assist (OT) Description: LTG: Patient will perform tub/shower transfers with assist, with/without cues using equipment (OT) Flowsheets (Taken 06/30/2023 1212) LTG: Pt will perform tub/shower stall transfers with assistance level of: Contact Guard/Touching assist

## 2023-06-30 NOTE — Evaluation (Signed)
Occupational Therapy Assessment and Plan  Patient Details  Name: [name]. MRN: [id number] Date of Birth: 24-May-1951  OT Diagnosis: abnormal posture, acute pain, blindness and low vision, and muscle weakness (generalized) Rehab Potential: Rehab Potential (ACUTE ONLY): Fair ELOS: 7-10 days   Today's Date: 06/30/2023 OT Individual Time: 1020-1130 OT Individual Time Calculation (min): 70 min     Hospital Problem: Principal Problem:   CVA (cerebral vascular accident) (HCC)   Past Medical History:  Past Medical History:  Diagnosis Date   Anxiety    COVID-19 virus infection 09/2020   CPAP (continuous positive airway pressure) dependence    Diabetes mellitus without complication (HCC)    GERD (gastroesophageal reflux disease)    Hypertension    Legally blind in left eye, as defined in [place]    PTSD (post-traumatic stress disorder)    PVD (peripheral vascular disease) (HCC) 01/17/2015   Renal disorder    Sleep apnea    Thyroid disease    Past Surgical History:  Past Surgical History:  Procedure Laterality Date   ABDOMINAL AORTAGRAM N/A 01/21/2015   Procedure: ABDOMINAL Ronny Flurry;  Surgeon: Chuck Hint, MD;  Location: [facility] CATH LAB;  Service: Cardiovascular;  Laterality: N/A;   ABDOMINAL AORTOGRAM W/LOWER EXTREMITY Right 06/15/2023   Procedure: ABDOMINAL AORTOGRAM W/LOWER EXTREMITY;  Surgeon: Nada Libman, MD;  Location: [facility] INVASIVE CV LAB;  Service: Cardiovascular;  Laterality: Right;   AMPUTATION Left 01/23/2015   Procedure: FIRST RAY AMPUTATION/LEFT FOOT;  Surgeon: Nadara Mustard, MD;  Location: [facility] OR;  Service: Orthopedics;  Laterality: Left;   AMPUTATION Left 02/20/2015   Procedure: AMPUTATION BELOW KNEE;  Surgeon: Nadara Mustard, MD;  Location: [facility] OR;  Service: Orthopedics;  Laterality: Left;   AMPUTATION Right 06/18/2023   Procedure: RIGHT BELOW KNEE AMPUTATION;  Surgeon: Nadara Mustard, MD;  Location: [facility] OR;  Service: Orthopedics;  Laterality: Right;   AV  FISTULA PLACEMENT     BALLOON DILATION N/A 02/26/2021   Procedure: BALLOON DILATION;  Surgeon: Hilarie Fredrickson, MD;  Location: [facility] ENDOSCOPY;  Service: Endoscopy;  Laterality: N/A;   BIOPSY  02/26/2021   Procedure: BIOPSY;  Surgeon: Hilarie Fredrickson, MD;  Location: [facility] ENDOSCOPY;  Service: Endoscopy;;   COLONOSCOPY     COMBINED KIDNEY-PANCREAS TRANSPLANT Left 2009   ESOPHAGOGASTRODUODENOSCOPY (EGD) WITH PROPOFOL N/A 02/26/2021   Procedure: ESOPHAGOGASTRODUODENOSCOPY (EGD) WITH PROPOFOL;  Surgeon: Hilarie Fredrickson, MD;  Location: [facility] ENDOSCOPY;  Service: Endoscopy;  Laterality: N/A;   INCISION AND DRAINAGE ABSCESS Left 01/13/2015   Procedure: INCISION AND DRAINAGE OF LEFT FOOT FIRST RAY ABCESS;  Surgeon: Kathryne Hitch, MD;  Location: [facility] OR;  Service: Orthopedics;  Laterality: Left;   LOWER EXTREMITY ANGIOGRAM Left 01/21/2015   Procedure: LOWER EXTREMITY ANGIOGRAM;  Surgeon: Chuck Hint, MD;  Location: [facility] CATH LAB;  Service: Cardiovascular;  Laterality: Left;   PERIPHERAL VASCULAR BALLOON ANGIOPLASTY Right 06/15/2023   Procedure: PERIPHERAL VASCULAR BALLOON ANGIOPLASTY;  Surgeon: Nada Libman, MD;  Location: [facility] INVASIVE CV LAB;  Service: Cardiovascular;  Laterality: Right;  right AT    Assessment & Plan Clinical Impression:  [name], [name] is a 72 year old male who presented to the ED on 06/06/2023 for acute onset of tremors and chills. He has had ongoing outpatient treatment for right plantar foot ulcer. Met SIRS criteria on admission. Given cefepime and admitted to hospitalist service. Cellcept and Prograf restarted for history of renal/pancreas transplantation 2009. Follows mostly at the [facility] with  Dr. Arrie Aran. He has a PMH also of PVD and left BKA in 2016. Dr. Lajoyce Corners consulted. ABIs ordered and VVS consulted. He developed uncontrolled nausea and malaise on 9/12. Rapid response called 9/13. He developed headaches, dizziness and dysphagia and GI was consulted and planned  EGD. Prior to procedure, he had significantly elevated blood pressure with diaphoresis. 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 Neurology was consulted. The patient reported some visual difficulties and that he had been dropping objects with his left hands. Neurology concluded that his stroke was quite small and did not correlate with most of his deficits. He was started on aspirin 81 mg daily and pending clearance from GI and vascular surgery he was recommended to start DAPT for 3 weeks followed by aspirin alone. He underwent abdominal aortogram with bifemoral runoff and angioplasty of the right anterior tibial artery by Dr. Myra Gianotti on 9/17. No inline flow to the dorsalis pedis artery. Vascular surgery felt a ray amputation was reasonable to offer but that the likelihood of healing was low. Discussed with Dr. Lajoyce Corners. 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 EGD deferred due to stroke and barium swallow obtained. Barium swallow showed severe dysmotility and incomplete esophageal emptying with inability to pass a 13 mm tablet. Treated for opioid associated constipation and empiric treatment for esophageal Candidiasis. Dysphagia improved. He was able to complete bed mobility with supervision and lateral transfer to drop down chair to left side with contact-guard assist post set up of lines. Once in a sitting position he was able to complete bilateral lateral upper extremity hygiene and dressing.The patient requires inpatient medicine and rehabilitation evaluations and services for ongoing dysfunction secondary to lacunar infarct and right BKA. Patient transferred to CIR on 06/29/2023 .    Patient currently requires mod with basic self-care skills secondary to muscle weakness, decreased cardiorespiratoy endurance, decreased coordination, decreased problem solving and delayed processing, and decreased sitting balance and decreased  standing balance.  Prior to hospitalization, patient could complete all self-care with mod I.  Patient will benefit from skilled intervention to decrease level of assist with basic self-care skills prior to discharge home with care partner.  Anticipate patient will require 24 hour supervision and follow up home health.  OT - End of Session Activity Tolerance: Tolerates < 10 min activity, no significant change in vital signs Endurance Deficit: Yes Endurance Deficit Description: required rest break after transfer OT Assessment Rehab Potential (ACUTE ONLY): Fair OT Barriers to Discharge: Wound Care;Home environment access/layout;Weight bearing restrictions OT Patient demonstrates impairments in the following area(s): Balance;Edema;Endurance;Motor;Pain;Vision;Sensory OT Basic ADL's Functional Problem(s): Bathing;Dressing;Toileting OT Transfers Functional Problem(s): Toilet;Tub/Shower OT Additional Impairment(s): None OT Plan OT Intensity: Minimum of 1-2 x/day, 45 to 90 minutes OT Frequency: 5 out of 7 days OT Duration/Estimated Length of Stay: 7-10 days OT Treatment/Interventions: Balance/vestibular training;Discharge planning;Pain management;Self Care/advanced ADL retraining;Therapeutic Activities;UE/LE Coordination activities;Visual/perceptual remediation/compensation;Therapeutic Exercise;Skin care/wound managment;Patient/family education;Functional mobility training;Disease mangement/prevention;DME/adaptive equipment instruction;Neuromuscular re-education;Psychosocial support;Splinting/orthotics;UE/LE Strength taining/ROM;Wheelchair propulsion/positioning OT Self Feeding Anticipated Outcome(s): Set up A OT Basic Self-Care Anticipated Outcome(s): Supervision OT Toileting Anticipated Outcome(s): Supervision OT Bathroom Transfers Anticipated Outcome(s): Supervision/CGA OT Recommendation Recommendations for Other Services: Neuropsych consult;Therapeutic Recreation consult Therapeutic Recreation  Interventions: Pet therapy Patient destination: Home Follow Up Recommendations: Home health OT Equipment Recommended: To be determined   OT Evaluation Precautions/Restrictions  Precautions Precautions: Fall Precaution Comments: wound vac, legally blind in Lt eye, fistual RUE Required Braces or Orthoses: Other Brace Other Brace:  RLE limb gaurd, LLE prosthesis however poor fitting Restrictions Weight Bearing Restrictions: Yes RLE Weight Bearing: Non weight bearing LLE Weight Bearing: Weight bearing as tolerated Home Living/Prior Functioning Home Living Family/patient expects to be discharged to:: Private residence Living Arrangements: Spouse/significant other, Other relatives Available Help at Discharge: Family, Available 24 hours/day (wife works full time day shift, and MIL is 70 y.o. reportedly so unsure about options for physical assist) Type of Home: Apartment Home Access: Level entry Entrance Stairs-Number of Steps: curb from parking lot Home Layout: One level Bathroom Shower/Tub: Tub/shower unit, Engineer, building services: Handicapped height Bathroom Accessibility: Yes Additional Comments: pt reports being independent with SPC and L prosthetic. Pt reports current prosthetic needs to be "fixed", stating that gel liner is "stuck" inside socket  Lives With: Spouse, Family (lives with wife and mother in [place]) IADL History Homemaking Responsibilities: Yes Meal Prep Responsibility: Secondary Leisure and Hobbies: aggravate grandkids Prior Function Level of Independence: Requires assistive device for independence Driving: No Vision Baseline Vision/History: 2 Legally blind;1 Wears glasses (hx of L eye blindness) Ability to See in Adequate Light: 1 Impaired Patient Visual Report: No change from baseline Vision Assessment?: Vision impaired- to be further tested in functional context Perception  Perception: Impaired Perception-Other Comments: impaired due to low  vision Praxis Praxis: WFL Cognition Cognition Overall Cognitive Status: Within Functional Limits for tasks assessed Arousal/Alertness: Awake/alert Orientation Level: Person;Place;Situation Person: Oriented Place: Oriented Situation: Oriented Memory: Appears intact Awareness: Appears intact Problem Solving: Impaired Safety/Judgment: Appears intact Brief Interview for Mental Status (BIMS) Repetition of Three Words (First Attempt): 3 Temporal Orientation: Year: Correct Temporal Orientation: Month: Accurate within 5 days Temporal Orientation: Day: Correct Recall: "Sock": Yes, no cue required Recall: "Blue": No, could not recall Recall: "Bed": No, could not recall BIMS Summary Score: 11 Sensation Sensation Light Touch: Appears Intact Proprioception: Impaired by gross assessment Additional Comments: pt reports "coldness" phantom sensation in R residual limb Coordination Gross Motor Movements are Fluid and Coordinated: No Fine Motor Movements are Fluid and Coordinated: No Finger Nose Finger Test: dysmetria bilaterally Heel Shin Test: unable to perform due to bilateral BKA Motor  Motor Motor: Abnormal postural alignment and control Motor - Skilled Clinical Observations: altered balance strategies due to new R BKA and prior L BKA with improper fitting prosthetic  Trunk/Postural Assessment  Cervical Assessment Cervical Assessment: Within Functional Limits Thoracic Assessment Thoracic Assessment: Exceptions to [facility] (mild kyphosis) Lumbar Assessment Lumbar Assessment: Exceptions to [facility] (posterior pelvic tilt) Postural Control Postural Control: Deficits on evaluation Righting Reactions: delayed Protective Responses: delayed  Balance Balance Balance Assessed: Yes Static Sitting Balance Static Sitting - Balance Support: Feet unsupported;No upper extremity supported Static Sitting - Level of Assistance: 5: Stand by assistance (supervision) Dynamic Sitting Balance Dynamic  Sitting - Balance Support: No upper extremity supported;Feet unsupported Dynamic Sitting - Level of Assistance: 5: Stand by assistance (supervision) Extremity/Trunk Assessment RUE Assessment RUE Assessment: Within Functional Limits LUE Assessment LUE Assessment: Within Functional Limits  Care Tool Care Tool Self Care Eating   Eating Assist Level: Set up assist    Oral Care    Oral Care Assist Level: Supervision/Verbal cueing    Bathing   Body parts bathed by patient: Right arm;Left arm;Chest;Abdomen;Front perineal area;Buttocks;Right upper leg;Left upper leg;Face   Body parts n/a: Right lower leg;Left lower leg Assist Level: Contact Guard/Touching assist    Upper Body Dressing(including orthotics)   What is the patient wearing?: Pull over shirt   Assist Level: Minimal Assistance - Patient > 75%  Lower Body Dressing (excluding footwear)   What is the patient wearing?: Pants Assist for lower body dressing: Minimal Assistance - Patient > 75%    Putting on/Taking off footwear   What is the patient wearing?: Orthosis (LLE prosthesis) Assist for footwear: Minimal Assistance - Patient > 75%       Care Tool Toileting Toileting activity   Assist for toileting: Minimal Assistance - Patient > 75%     Care Tool Bed Mobility Roll left and right activity   Roll left and right assist level: Supervision/Verbal cueing    Sit to lying activity   Sit to lying assist level: Supervision/Verbal cueing    Lying to sitting on side of bed activity   Lying to sitting on side of bed assist level: the ability to move from lying on the back to sitting on the side of the bed with no back support.: Supervision/Verbal cueing     Care Tool Transfers Sit to stand transfer Sit to stand assist level: Maximal Assistance - Patient 25 - 49%    Chair/bed transfer   Chair/bed transfer assist level: Moderate Assistance - Patient 50 - 74% (PA)     Toilet transfer   Assist Level: Moderate Assistance  - Patient 50 - 74% (slideboard)     Care Tool Cognition  Expression of Ideas and Wants Expression of Ideas and Wants: 4. Without difficulty (complex and basic) - expresses complex messages without difficulty and with speech that is clear and easy to understand  Understanding Verbal and Non-Verbal Content Understanding Verbal and Non-Verbal Content: 4. Understands (complex and basic) - clear comprehension without cues or repetitions   Memory/Recall Ability Memory/Recall Ability : Current season;That he or she is in a hospital/hospital unit   Refer to Care Plan for Long Term Goals  SHORT TERM GOAL WEEK 1 OT Short Term Goal 1 (Week 1): STG = LTG 2/2 ELOS  Recommendations for other services: Neuropsych and Therapeutic Recreation  Pet therapy   Skilled Therapeutic Intervention Patient received seated in w/c upon therapy arrival and agreeable to participate in OT evaluation. Education provided on OT purpose, therapy schedule, goals for therapy, and safety policy while in rehab. No pain expressed during session just fatigue.  Patient demonstrates global deconditioning, vision impairments as well as strength, sitting/standing balance, problem solving and coordination deficits resulting in difficulty completing BADL tasks without increased physical assist. Pt will benefit from skilled OT services to focus on mentioned deficits. See below for ADL and functional transfer performance. Mod A slide board transfer completed from w/c <> bariatric drop arm BSC with max cues for sequencing. Continent of BM only; charted. OT was able to disconnect LLE socket from prosthesis, then had pt donn sleeve and prosthesis with min A. Able to stand with RW with heavy max A but pt unable to remain standing or transfer both hands to RW therefore returned seated. Nurse present for meds and IV management with direct care handoff to nursing at OT departure.   ADL ADL Eating: Set up Where Assessed-Eating:  Wheelchair Grooming: Supervision/safety Where Assessed-Grooming: Sitting at sink Upper Body Bathing: Supervision/safety Where Assessed-Upper Body Bathing: Sitting at sink Lower Body Bathing: Contact guard Where Assessed-Lower Body Bathing: Other (Comment) (BSC) Upper Body Dressing: Minimal assistance Where Assessed-Upper Body Dressing: Sitting at sink Lower Body Dressing: Minimal assistance Where Assessed-Lower Body Dressing: Other (Comment) (BSC) Toileting: Minimal assistance Where Assessed-Toileting: Bedside Commode Toilet Transfer: Moderate assistance Toilet Transfer Method: Scientist, research (life sciences): Extra wide drop arm bedside commode;Grab  bars Tub/Shower Transfer: Unable to assess Tub/Shower Transfer Method: Unable to assess Film/video editor: Unable to assess [employer] Transfer Method: Unable to assess Mobility  Bed Mobility Bed Mobility: Rolling Right;Rolling Left;Sit to Supine;Supine to Sit Rolling Right: Supervision/verbal cueing Rolling Left: Supervision/Verbal cueing Supine to Sit: Supervision/Verbal cueing Sit to Supine: Supervision/Verbal cueing Transfers Sit to Stand: Maximal Assistance - Patient 25-49%   Discharge Criteria: Patient will be discharged from OT if patient refuses treatment 3 consecutive times without medical reason, if treatment goals not met, if there is a change in medical status, if patient makes no progress towards goals or if patient is discharged from hospital.  The above assessment, treatment plan, treatment alternatives and goals were discussed and mutually agreed upon: by patient  Melvyn Novas, MS, OTR/L  06/30/2023, 12:11 PM

## 2023-06-30 NOTE — Progress Notes (Signed)
Removed wound vac per order. Patient tolerated well. No drainage.  [name] [place]

## 2023-06-30 NOTE — Progress Notes (Signed)
Inpatient Rehabilitation Care Coordinator Assessment and Plan Patient Details  Name: [name]. MRN: [id number] Date of Birth: 01/25/1951  Today's Date: 06/30/2023  Hospital Problems: Principal Problem:   CVA (cerebral vascular accident) [facility])  Past Medical History:  Past Medical History:  Diagnosis Date   Anxiety    COVID-19 virus infection 09/2020   CPAP (continuous positive airway pressure) dependence    Diabetes mellitus without complication (HCC)    GERD (gastroesophageal reflux disease)    Hypertension    Legally blind in left eye, as defined in [place]    PTSD (post-traumatic stress disorder)    PVD (peripheral vascular disease) (HCC) 01/17/2015   Renal disorder    Sleep apnea    Thyroid disease    Past Surgical History:  Past Surgical History:  Procedure Laterality Date   ABDOMINAL AORTAGRAM N/A 01/21/2015   Procedure: ABDOMINAL Ronny Flurry;  Surgeon: Chuck Hint, MD;  Location: [facility] CATH LAB;  Service: Cardiovascular;  Laterality: N/A;   ABDOMINAL AORTOGRAM W/LOWER EXTREMITY Right 06/15/2023   Procedure: ABDOMINAL AORTOGRAM W/LOWER EXTREMITY;  Surgeon: Nada Libman, MD;  Location: [facility] INVASIVE CV LAB;  Service: Cardiovascular;  Laterality: Right;   AMPUTATION Left 01/23/2015   Procedure: FIRST RAY AMPUTATION/LEFT FOOT;  Surgeon: Nadara Mustard, MD;  Location: [facility] OR;  Service: Orthopedics;  Laterality: Left;   AMPUTATION Left 02/20/2015   Procedure: AMPUTATION BELOW KNEE;  Surgeon: Nadara Mustard, MD;  Location: [facility] OR;  Service: Orthopedics;  Laterality: Left;   AMPUTATION Right 06/18/2023   Procedure: RIGHT BELOW KNEE AMPUTATION;  Surgeon: Nadara Mustard, MD;  Location: [facility] OR;  Service: Orthopedics;  Laterality: Right;   AV FISTULA PLACEMENT     BALLOON DILATION N/A 02/26/2021   Procedure: BALLOON DILATION;  Surgeon: Hilarie Fredrickson, MD;  Location: [facility] ENDOSCOPY;  Service: Endoscopy;  Laterality: N/A;   BIOPSY  02/26/2021   Procedure: BIOPSY;  Surgeon: Hilarie Fredrickson,  MD;  Location: [facility] ENDOSCOPY;  Service: Endoscopy;;   COLONOSCOPY     COMBINED KIDNEY-PANCREAS TRANSPLANT Left 2009   ESOPHAGOGASTRODUODENOSCOPY (EGD) WITH PROPOFOL N/A 02/26/2021   Procedure: ESOPHAGOGASTRODUODENOSCOPY (EGD) WITH PROPOFOL;  Surgeon: Hilarie Fredrickson, MD;  Location: [facility] ENDOSCOPY;  Service: Endoscopy;  Laterality: N/A;   INCISION AND DRAINAGE ABSCESS Left 01/13/2015   Procedure: INCISION AND DRAINAGE OF LEFT FOOT FIRST RAY ABCESS;  Surgeon: Kathryne Hitch, MD;  Location: [facility] OR;  Service: Orthopedics;  Laterality: Left;   LOWER EXTREMITY ANGIOGRAM Left 01/21/2015   Procedure: LOWER EXTREMITY ANGIOGRAM;  Surgeon: Chuck Hint, MD;  Location: [facility] CATH LAB;  Service: Cardiovascular;  Laterality: Left;   PERIPHERAL VASCULAR BALLOON ANGIOPLASTY Right 06/15/2023   Procedure: PERIPHERAL VASCULAR BALLOON ANGIOPLASTY;  Surgeon: Nada Libman, MD;  Location: [facility] INVASIVE CV LAB;  Service: Cardiovascular;  Laterality: Right;  right AT   Social History:  reports that he has quit smoking. He has never used smokeless tobacco. He reports current alcohol use. He reports current drug use. Drug: Marijuana.  Family / Support Systems Marital Status: Married How Long?: n/a Patient Roles: Spouse Spouse/Significant Other: Milford Cage Children: N/A Other Supports: Spouse Anticipated Caregiver: [name], Spouse Ability/Limitations of Caregiver: None Caregiver Availability: 24/7 Family Dynamics: support from spouse  Social History Preferred language: English Religion: Holiness Cultural Background: n/a Education: [facility] Literacy - How often do you need to have someone help you when you read instructions, pamphlets, or other written material from your doctor or pharmacy?: Sometimes Writes: Yes Employment Status: Disabled  Legal History/Current Legal Issues: n/a Guardian/Conservator: n/a   Abuse/Neglect Abuse/Neglect Assessment Can Be Completed: Yes Physical Abuse: Denies Verbal  Abuse: Denies Sexual Abuse: Denies Exploitation of patient/patient's resources: Denies Self-Neglect: Denies  Patient response to: Social Isolation - How often do you feel lonely or isolated from those around you?: Never  Emotional Status Pt's affect, behavior and adjustment status: pleasant. Upset about nursing treatment and missing cell phone (different unit) Recent Psychosocial Issues: coping Psychiatric History: hx of anixety Substance Abuse History: n/a  Patient / Family Perceptions, Expectations & Goals Pt/Family understanding of illness & functional limitations: yes Premorbid pt/family roles/activities: ambulating with SPC and in the community 3x a week Anticipated changes in roles/activities/participation: supervision and assistace from spouse Pt/family expectations/goals: sup/MOD I  [employer]: None Premorbid Home Care/DME Agencies: Other (Comment) (SPC, shower seat, TTB and BSC) Transportation available at discharge: spouse Is the patient able to respond to transportation needs?: Yes In the past 12 months, has lack of transportation kept you from medical appointments or from getting medications?: No In the past 12 months, has lack of transportation kept you from meetings, work, or from getting things needed for daily living?: No Resource referrals recommended: Neuropsychology  Discharge Planning Living Arrangements: Spouse/significant other, Other relatives (spouse and MIL) Support Systems: Spouse/significant other Type of Residence: Private residence (1 level, 1 step  New place: 5 steps or 4 steps) Insurance Resources: Media planner (specify) Multimedia programmer Medicare) Financial Resources: Family Support Financial Screen Referred: No Living Expenses: Lives with family Money Management: Family Does the patient have any problems obtaining your medications?: No Home Management: assist from spouse Patient/Family Preliminary Plans: plans to  continue Care Coordinator Barriers to Discharge: Insurance for SNF coverage Care Coordinator Anticipated Follow Up Needs: HH/OP Expected length of stay: 7-10 Days  Clinical Impression SW met with patient and introduce self and explained role. Patient anticipates discharging home with his spouse. Pt reports a missing cell phone and has not received a FU. SLP evals pending. Patient pleased with current date. No additional questions or concerns.     [name] 06/30/2023, 3:21 PM

## 2023-06-30 NOTE — Evaluation (Signed)
Speech Language Pathology Assessment and Plan  Patient Details  Name: [name]. MRN: [id number] Date of Birth: 1951/07/17  SLP Diagnosis: Cognitive Impairments;Dysarthria  Rehab Potential: Good ELOS: 10/10   Today's Date: 06/30/2023 SLP Individual Time: 0865-7846 SLP Individual Time Calculation (min): 49 min  Hospital Problem: Principal Problem:   CVA (cerebral vascular accident) [facility])  Past Medical History:  Past Medical History:  Diagnosis Date   Anxiety    COVID-19 virus infection 09/2020   CPAP (continuous positive airway pressure) dependence    Diabetes mellitus without complication (HCC)    GERD (gastroesophageal reflux disease)    Hypertension    Legally blind in left eye, as defined in [place]    PTSD (post-traumatic stress disorder)    PVD (peripheral vascular disease) (HCC) 01/17/2015   Renal disorder    Sleep apnea    Thyroid disease    Past Surgical History:  Past Surgical History:  Procedure Laterality Date   ABDOMINAL AORTAGRAM N/A 01/21/2015   Procedure: ABDOMINAL Ronny Flurry;  Surgeon: Chuck Hint, MD;  Location: [facility] CATH LAB;  Service: Cardiovascular;  Laterality: N/A;   ABDOMINAL AORTOGRAM W/LOWER EXTREMITY Right 06/15/2023   Procedure: ABDOMINAL AORTOGRAM W/LOWER EXTREMITY;  Surgeon: Nada Libman, MD;  [facility]: [facility] INVASIVE CV LAB;  Service: Cardiovascular;  Laterality: Right;   AMPUTATION Left 01/23/2015   Procedure: FIRST RAY AMPUTATION/LEFT FOOT;  Surgeon: Nadara Mustard, MD;  Location: [facility] OR;  Service: Orthopedics;  Laterality: Left;   AMPUTATION Left 02/20/2015   Procedure: AMPUTATION BELOW KNEE;  Surgeon: Nadara Mustard, MD;  Location: [facility] OR;  Service: Orthopedics;  Laterality: Left;   AMPUTATION Right 06/18/2023   Procedure: RIGHT BELOW KNEE AMPUTATION;  Surgeon: Nadara Mustard, MD;  Location: [facility] OR;  Service: Orthopedics;  Laterality: Right;   AV FISTULA PLACEMENT     BALLOON DILATION N/A 02/26/2021   Procedure: BALLOON DILATION;   Surgeon: Hilarie Fredrickson, MD;  Location: [facility] ENDOSCOPY;  Service: Endoscopy;  Laterality: N/A;   BIOPSY  02/26/2021   Procedure: BIOPSY;  Surgeon: Hilarie Fredrickson, MD;  Location: [facility] ENDOSCOPY;  Service: Endoscopy;;   COLONOSCOPY     COMBINED KIDNEY-PANCREAS TRANSPLANT Left 2009   ESOPHAGOGASTRODUODENOSCOPY (EGD) WITH PROPOFOL N/A 02/26/2021   Procedure: ESOPHAGOGASTRODUODENOSCOPY (EGD) WITH PROPOFOL;  Surgeon: Hilarie Fredrickson, MD;  Location: [facility] ENDOSCOPY;  Service: Endoscopy;  Laterality: N/A;   INCISION AND DRAINAGE ABSCESS Left 01/13/2015   Procedure: INCISION AND DRAINAGE OF LEFT FOOT FIRST RAY ABCESS;  Surgeon: Kathryne Hitch, MD;  Location: [facility] OR;  Service: Orthopedics;  Laterality: Left;   LOWER EXTREMITY ANGIOGRAM Left 01/21/2015   Procedure: LOWER EXTREMITY ANGIOGRAM;  Surgeon: Chuck Hint, MD;  Location: [facility] CATH LAB;  Service: Cardiovascular;  Laterality: Left;   PERIPHERAL VASCULAR BALLOON ANGIOPLASTY Right 06/15/2023   Procedure: PERIPHERAL VASCULAR BALLOON ANGIOPLASTY;  Surgeon: Nada Libman, MD;  Location: [facility] INVASIVE CV LAB;  Service: Cardiovascular;  Laterality: Right;  right AT    Assessment / Plan / Recommendation Clinical Impression Patient is a 72 y.o. year old male who presented to the ED on 06/06/2023 for acute onset of tremors and chills. Given cefepime and admitted to hospitalist service. Cellcept and Prograf restarted for history of renal/pancreas transplantation 2009. He has a PMH of PVD and left BKA in 2016. He developed uncontrolled nausea and malaise on 9/12. Rapid response called 9/13. He developed headaches, dizziness and dysphagia and GI was consulted and planned EGD. Prior to procedure, he had significantly  elevated blood pressure with diaphoresis. 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 EGD deferred due to stroke and barium swallow  obtained. Barium swallow showed severe dysmotility and incomplete esophageal emptying with inability to pass a 13 mm tablet. Treated for opioid associated constipation and empiric treatment for esophageal Candidiasis. Dysphagia improved. Patient completed therapy evaluations and was recommend for CIR comprehension rehabilitation program.   Cognitive-Linguistic Evaluation: Patient presents with deficits in the areas of motor speech, memory, attention, orientation, problem solving, judgement, and awareness. During case history interview, patient exhibited dysarthria characterized primarily by low vocal intensity, flat affect, and imprecise articulation (hypokinetic), resulting in overall reduced intelligibility at the sentence and conversation levels. When prompted, patient reports that he is speaking softly on purpose so that people do not "think he's angry." No family present to confirm/deny similarities to baseline speech. During COGNISTAT evaluation, patient demonstrated cognitive deficits across all aforementioned domains with relative strengths in the area of basic calculations. Patient would benefit from SLP services during CIR admission to target aforementioned speech and cognitive deficits.   Swallowing: Patient's prior recent swallowing concerns appear to have been esophageal in nature and have since resolved. Acute care SLPs have signed off, no overt difficulty observed during session this date. Thus, formal bedside swallow evaluation not conducted; continue regular/thin liquid diet. No SLP swallowing needs indicated during CIR admission.    Skilled Therapeutic Interventions          SLP conducted skilled evaluation session assessing swallowing and cognitive-linguistic function via bedside swallow evaluation and COGNISTAT. Results above.    SLP Assessment  Patient will need skilled Speech Lanaguage Pathology Services during CIR admission    Recommendations  SLP Diet Recommendations: Age  appropriate regular solids;Thin Liquid Administration via: Cup;Straw Medication Administration: Whole meds with liquid Supervision: Patient able to self feed Oral Care Recommendations: Oral care BID Recommendations for Other Services: Neuropsych consult Patient destination: Home Follow up Recommendations: 24 hour supervision/assistance;Home Health SLP Equipment Recommended: None recommended by SLP    SLP Frequency 3 to 5 out of 7 days   SLP Duration  SLP Intensity  SLP Treatment/Interventions 10/10  Minumum of 1-2 x/day, 30 to 90 minutes  Cognitive remediation/compensation;Environmental controls;Speech/Language facilitation;Cueing hierarchy;Functional tasks;Therapeutic Activities;Internal/external aids;Patient/family education    Pain Pain Assessment Pain Scale: 0-10 Pain Score: 0-No pain  Prior Functioning Cognitive/Linguistic Baseline: Within functional limits Type of Home: Apartment  Lives With: Spouse;Family (lives with wife and mother in Social worker) Available Help at Discharge: Family;Available 24 hours/day Vocation: Retired  Architectural technologist Overall Cognitive Status: Impaired/Different from baseline Arousal/Alertness: Awake/alert Orientation Level: Oriented to person;Oriented to place;Oriented to situation;Disoriented to time Year: 2024 Month: October Day of Week: Incorrect Attention: Sustained Sustained Attention: Impaired Sustained Attention Impairment: Functional basic Memory: Impaired Memory Impairment: Retrieval deficit Awareness: Impaired Awareness Impairment: Intellectual impairment;Emergent impairment;Anticipatory impairment Problem Solving: Impaired Problem Solving Impairment: Verbal basic;Functional basic Executive Function: Reasoning;Sequencing;Organizing;Self Monitoring;Self Correcting Reasoning: Impaired Reasoning Impairment: Verbal basic Sequencing: Impaired Sequencing Impairment: Functional basic Organizing: Impaired Organizing Impairment:  Functional basic Self Monitoring: Impaired Self Monitoring Impairment: Functional basic Self Correcting: Impaired Self Correcting Impairment: Functional basic Safety/Judgment: Impaired  Comprehension Auditory Comprehension Overall Auditory Comprehension: Appears within functional limits for tasks assessed Expression Expression Primary Mode of Expression: Verbal Verbal Expression Overall Verbal Expression: Appears within functional limits for tasks assessed Written Expression Dominant Hand: Right Written Expression: Not tested Oral Motor Oral Motor/Sensory Function Overall  Oral Motor/Sensory Function: Within functional limits Motor Speech Overall Motor Speech: Impaired Respiration: Within functional limits Phonation: Low vocal intensity Resonance: Within functional limits Articulation: Impaired Level of Impairment: Sentence Intelligibility: Intelligibility reduced Word: 75-100% accurate Phrase: 75-100% accurate Sentence: 50-74% accurate Conversation: 50-74% accurate Motor Planning: Witnin functional limits Motor Speech Errors: Not applicable Effective Techniques: Increased vocal intensity;Over-articulate  Care Tool Care Tool Cognition Ability to hear (with hearing aid or hearing appliances if normally used Ability to hear (with hearing aid or hearing appliances if normally used): 0. Adequate - no difficulty in normal conservation, social interaction, listening to TV   Expression of Ideas and Wants Expression of Ideas and Wants: 4. Without difficulty (complex and basic) - expresses complex messages without difficulty and with speech that is clear and easy to understand   Understanding Verbal and Non-Verbal Content Understanding Verbal and Non-Verbal Content: 4. Understands (complex and basic) - clear comprehension without cues or repetitions  Memory/Recall Ability Memory/Recall Ability : Current season;That he or she is in a hospital/hospital unit   Intelligibility:  Intelligibility reduced Word: 75-100% accurate Phrase: 75-100% accurate Sentence: 50-74% accurate Conversation: 50-74% accurate  Short Term Goals: Week 1: SLP Short Term Goal 1 (Week 1): STGs=LTGs d/t ELOS  Refer to Care Plan for Long Term Goals  Recommendations for other services: Neuropsych  Discharge Criteria: Patient will be discharged from SLP if patient refuses treatment 3 consecutive times without medical reason, if treatment goals not met, if there is a change in medical status, if patient makes no progress towards goals or if patient is discharged from hospital.  The above assessment, treatment plan, treatment alternatives and goals were discussed and mutually agreed upon: by patient  Jeannie Done, M.A., CCC-SLP   [name] 06/30/2023, 2:35 PM

## 2023-06-30 NOTE — Progress Notes (Signed)
Lower extremity venous duplex completed. Please see CV Procedures for preliminary results.  Initial findings reported to Shelba Flake, LPN.  Shona Simpson, RVT 06/30/23 2:16 PM

## 2023-06-30 NOTE — Progress Notes (Addendum)
PROGRESS NOTE   Subjective/Complaints: No new complaints this morning Denies pain Flat affect Wife at bedside  ROS: denies pain   Objective:   No results found. Recent Labs    06/30/23 0517  WBC 7.8  HGB 8.6*  HCT 27.6*  PLT 264   Recent Labs    06/28/23 0334 06/30/23 0517  NA 135 136  K 4.8 5.2*  CL 98 101  CO2 23 25  GLUCOSE 114* 154*  BUN 15 17  CREATININE 1.17 1.21  CALCIUM 9.4 9.5    Intake/Output Summary (Last 24 hours) at 06/30/2023 1507 Last data filed at 06/30/2023 0943 Gross per 24 hour  Intake 480 ml  Output 650 ml  Net -170 ml        Physical Exam: Vital Signs Blood pressure (!) 130/53, pulse 83, temperature 98.4 F (36.9 C), resp. rate 16, height 4\' 8"  (1.422 m), weight 67.5 kg, SpO2 100%. Gen: no distress, normal appearing HEENT: oral mucosa pink and moist, NCAT Cardio: Reg rate Chest: normal effort, normal rate of breathing Abd: soft, non-distended Ext: no edema Psych: pleasant, normal affect Skin: intact Musculoskeletal:     Cervical back: Normal range of motion.     Comments: Right BK stump in KI, vac attached, no drainage in cannister  Skin:    Comments: Left residual limb intact and appropriately shaped  Neurological:     Mental Status: He is alert.     Comments: Alert and oriented x 3. Normal insight and awareness. Intact Memory. Normal language and speech. Cranial nerve exam unremarkable although pt is blind in his left eye. MMT: RUE 5/5, 4+/5 LUE. RLE limited by pain. LLE 4/5 HF, KE. Sensory exam normal for light touch and pain in all 4 limbs. No limb ataxia or cerebellar signs. No abnormal tone appreciated.  Marland Kitchen    Psychiatric:     Comments: Pt upset due to ongoing pain in right leg.    Assessment/Plan: 1. Functional deficits which require 3+ hours per day of interdisciplinary therapy in a comprehensive inpatient rehab setting. Physiatrist is providing close team  supervision and 24 hour management of active medical problems listed below. Physiatrist and rehab team continue to assess barriers to discharge/monitor patient progress toward functional and medical goals  Care Tool:  Bathing    Body parts bathed by patient: Right arm, Left arm, Chest, Abdomen, Front perineal area, Buttocks, Right upper leg, Left upper leg, Face     Body parts n/a: Right lower leg, Left lower leg   Bathing assist Assist Level: Contact Guard/Touching assist     Upper Body Dressing/Undressing Upper body dressing   What is the patient wearing?: Pull over shirt    Upper body assist Assist Level: Minimal Assistance - Patient > 75%    Lower Body Dressing/Undressing Lower body dressing      What is the patient wearing?: Pants     Lower body assist Assist for lower body dressing: Minimal Assistance - Patient > 75%     Toileting Toileting    Toileting assist Assist for toileting: Minimal Assistance - Patient > 75%     Transfers Chair/bed transfer  Transfers assist  Chair/bed transfer assist level: Moderate Assistance - Patient 50 - 74% (PA)     Locomotion Ambulation   Ambulation assist   Ambulation activity did not occur: Safety/medical concerns (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)          Walk 10 feet activity   Assist  Walk 10 feet activity did not occur: Safety/medical concerns (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)        Walk 50 feet activity   Assist Walk 50 feet with 2 turns activity did not occur: Safety/medical concerns (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)         Walk 150 feet activity   Assist Walk 150 feet activity did not occur: Safety/medical concerns (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)         Walk 10 feet on uneven surface  activity   Assist Walk 10 feet on uneven surfaces activity did not occur: Safety/medical concerns (bilateral BKA with  improper fitting L prosthetic)         Wheelchair     Assist Is the patient using a wheelchair?: Yes Type of Wheelchair: Manual Wheelchair activity did not occur: Safety/medical concerns         Wheelchair 50 feet with 2 turns activity    Assist    Wheelchair 50 feet with 2 turns activity did not occur: Safety/medical concerns       Wheelchair 150 feet activity     Assist  Wheelchair 150 feet activity did not occur: Safety/medical concerns       Blood pressure (!) 130/53, pulse 83, temperature 98.4 F (36.9 C), resp. rate 16, height 4\' 8"  (1.422 m), weight 67.5 kg, SpO2 100%.  Medical Problem List and Plan: 1. 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 Someone put a prosthetic sock inside the socket of his prosthesis and then forced the pin-liner into the lock. Now the pin-liner can't be removed. They will come tomorrow to see if they can help remove the pin. Prosthesis might need to be taken apart.  2.  Antithrombotics: -DVT/anticoagulation:  Pharmaceutical: Lovenox             -antiplatelet therapy: Aspirin and Plavix for three weeks followed by aspirin alone.  Stop date for Plavix 10/10   3. Postop and phantom limb pain RLE: pt with severe pain this afternoon -Tylenol, oxycodone as needed -add gabapentin for phantom limb pain, 100mg  TID   4. Mood/Behavior/Sleep: LCSW to evaluate and provide emotional support             -antipsychotic agents: n/a             -continue trazodone 50 mg prn sleep   5. Neuropsych/cognition: This patient is capable of making decisions on his own behalf.   6. Skin/Wound Care: Vac still in place from 9/20             -remove vac and apply black shrinker sock (ordered)             -f/u with Dr. Lajoyce Corners  as outpt   7. Fluids/Electrolytes/Nutrition:  Routine Is and Os and follow-up chemistries             -continue zinc, Vit D, C supplements             -carb modified diet   8: Hypertension: monitor TID and prn             -continue amlodipine 5 mg daily             -decrease losartan to 25mg  daily   9: Hyperlipidemia: continue statin     11: DM: A1c = 11.1%; CBGs QID             -continue SSI             -increase semglee to 8U  -advised to avoid added sugar   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 Hypotension: decrease Cozaar to 25mg  daily.  16.  Suboptimal vitamin D: start daily D3  17. Hyperkalemia: lokelma x1 administered 10/2  18. Hypoalbuminemia: educated regarding eating enough protein from whole food sources.     LOS: 1 days A FACE TO FACE EVALUATION WAS PERFORMED  Clint Bolder P Araeya Lamb 06/30/2023, 3:07 PM

## 2023-06-30 NOTE — Progress Notes (Signed)
[facility] Individual Statement of Services  Patient Name:  [name].  Date:  06/30/2023  Welcome to the [facility].  Our goal is to provide you with an individualized program based on your diagnosis and situation, designed to meet your specific needs.  With this comprehensive rehabilitation program, you will be expected to participate in at least 3 hours of rehabilitation therapies Monday-Friday, with modified therapy programming on the weekends.  Your rehabilitation program will include the following services:  Physical Therapy (PT), Occupational Therapy (OT), Speech Therapy (ST), 24 hour per day rehabilitation nursing, Therapeutic Recreaction (TR), Neuropsychology, Care Coordinator, Rehabilitation Medicine, Nutrition Services, Pharmacy Services, and Other  Weekly team conferences will be held on Wesnesdays to discuss your progress.  Your Inpatient Rehabilitation Care Coordinator will talk with you frequently to get your input and to update you on team discussions.  Team conferences with you and your family in attendance may also be held.  Expected length of stay: 7-10 Days  Overall anticipated outcome: Supervision to Mod I   Depending on your progress and recovery, your program may change. Your Inpatient Rehabilitation Care Coordinator will coordinate services and will keep you informed of any changes. Your Inpatient Rehabilitation Care Coordinator's name and contact numbers are listed  below.  The following services may also be recommended but are not provided by the Inpatient [facility]:   Home Health Rehabiltiation Services Outpatient Rehabilitation Services    Arrangements will be made to provide these services after discharge if needed.  Arrangements include referral to agencies that provide these services.  Your insurance has been verified to be:  [employer] Your primary doctor is:  Dorinda Hill, MD  Pertinent  information will be shared with your doctor and your insurance company.  Inpatient Rehabilitation Care Coordinator:  Lavera Guise, [place] [phone] or [phone]  Information discussed with and copy given to patient by: [name], 06/30/2023, 9:11 AM

## 2023-07-01 LAB — GLUCOSE, CAPILLARY
Glucose-Capillary: 152 mg/dL — ABNORMAL HIGH (ref 70–99)
Glucose-Capillary: 161 mg/dL — ABNORMAL HIGH (ref 70–99)
Glucose-Capillary: 176 mg/dL — ABNORMAL HIGH (ref 70–99)
Glucose-Capillary: 168 mg/dL — ABNORMAL HIGH (ref 70–99)

## 2023-07-01 MED ORDER — INSULIN GLARGINE-YFGN 100 UNIT/ML ~~LOC~~ SOLN
10.0000 [IU] | Freq: Every day | SUBCUTANEOUS | Status: DC
  Administered 2023-07-02: 10 [IU] via SUBCUTANEOUS
  Filled 2023-07-01: qty 0.1

## 2023-07-01 MED ORDER — VITAMIN D 25 MCG (1000 UNIT) PO TABS
2000.0000 [IU] | ORAL_TABLET | Freq: Every day | ORAL | Status: DC
  Administered 2023-07-02 – 2023-07-05 (×4): 2000 [IU] via ORAL
  Filled 2023-07-01 (×4): qty 2

## 2023-07-01 NOTE — Progress Notes (Signed)
PROGRESS NOTE   Subjective/Complaints: No new complaints this morning Discussed elevated blood sugars and increasing insulin Wound vac removed   ROS: denies pain, constipation   Objective:   VAS [place] LOWER EXTREMITY VENOUS (DVT)  Result Date: 06/30/2023  Lower Venous DVT Study Patient Name:  [name].  Date of Exam:   06/30/2023 Medical Rec #: [id number]           Accession #:    [id number] Date of Birth: [birth year]           Patient Gender: M Patient Age:   72 years Exam Location:  [facility] Procedure:      VAS [place] LOWER EXTREMITY VENOUS (DVT) Referring Phys: Wendi Maya --------------------------------------------------------------------------------  Indications: Edema.  Risk Factors: Immobility Surgery RT BKA 06/18/23. Anticoagulation: Lovenox. Limitations: Bilateral BKA. Comparison Study: No prior study Performing Technologist: Shona Simpson  Examination Guidelines: A complete evaluation includes B-mode imaging, spectral Doppler, color Doppler, and power Doppler as needed of all accessible portions of each vessel. Bilateral testing is considered an integral part of a complete examination. Limited examinations for reoccurring indications may be performed as noted. The reflux portion of the exam is performed with the patient in reverse Trendelenburg.  +---------+---------------+---------+-----------+----------+--------------+ RIGHT    CompressibilityPhasicitySpontaneityPropertiesThrombus Aging +---------+---------------+---------+-----------+----------+--------------+ CFV      Full           Yes      Yes                                 +---------+---------------+---------+-----------+----------+--------------+ SFJ      Full                                                        +---------+---------------+---------+-----------+----------+--------------+ FV Prox  Full                                                         +---------+---------------+---------+-----------+----------+--------------+ FV Mid   Full                                                        +---------+---------------+---------+-----------+----------+--------------+ FV DistalFull                                                        +---------+---------------+---------+-----------+----------+--------------+ PFV      Full                                                        +---------+---------------+---------+-----------+----------+--------------+  POP      Full           Yes      Yes                                 +---------+---------------+---------+-----------+----------+--------------+   +---------+---------------+---------+-----------+---------------+--------------+ LEFT     CompressibilityPhasicitySpontaneityProperties     Thrombus Aging +---------+---------------+---------+-----------+---------------+--------------+ CFV      Full           Yes      Yes                                      +---------+---------------+---------+-----------+---------------+--------------+ SFJ      Full                                                             +---------+---------------+---------+-----------+---------------+--------------+ FV Prox  Full                                                             +---------+---------------+---------+-----------+---------------+--------------+ FV Mid   Partial                            rigid          Age                                                        w/compression  Indeterminate  +---------+---------------+---------+-----------+---------------+--------------+ FV DistalPartial                            rigid          Age                                                        w/compression  Indeterminate  +---------+---------------+---------+-----------+---------------+--------------+ PFV      Full                                                              +---------+---------------+---------+-----------+---------------+--------------+ POP      Full           Yes      Yes                                      +---------+---------------+---------+-----------+---------------+--------------+  Summary: RIGHT: - There is no evidence of deep vein thrombosis in the lower extremity.  - No cystic structure found in the popliteal fossa.  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  - No cystic structure found in the popliteal fossa.  *See table(s) above for measurements and observations. Electronically signed by Sherald Hess MD on 06/30/2023 at 3:52:31 PM.    Final    Recent Labs    06/30/23 0517  WBC 7.8  HGB 8.6*  HCT 27.6*  PLT 264   Recent Labs    06/30/23 0517  NA 136  K 5.2*  CL 101  CO2 25  GLUCOSE 154*  BUN 17  CREATININE 1.21  CALCIUM 9.5    Intake/Output Summary (Last 24 hours) at 07/01/2023 1038 Last data filed at 07/01/2023 1000 Gross per 24 hour  Intake 594 ml  Output 1400 ml  Net -806 ml        Physical Exam: Vital Signs Blood pressure 133/61, pulse 78, temperature 98.6 F (37 C), temperature source Oral, resp. rate 16, height 4\' 8"  (1.422 m), weight 67.5 kg, SpO2 100%. Gen: no distress, normal appearing HEENT: oral mucosa pink and moist, NCAT Cardio: Reg rate Chest: normal effort, normal rate of breathing Abd: soft, non-distended Ext: no edema Psych: pleasant, normal affect  Musculoskeletal:     Cervical back: Normal range of motion.     Comments: Right BK stump in KI, vac attached, no drainage in cannister  Skin:    Comments: Left residual limb intact and appropriately shaped  Neurological:     Mental Status: He is alert.     Comments: Alert and oriented x 3. Normal insight and awareness. Intact Memory. Normal language and speech. Cranial nerve exam unremarkable although pt is blind in his  left eye. MMT: RUE 5/5, 4+/5 LUE. RLE limited by pain. LLE 4/5 HF, KE. Sensory exam normal for light touch and pain in all 4 limbs. No limb ataxia or cerebellar signs. No abnormal tone appreciated.  Marland Kitchen    Psychiatric:     Comments: Pt upset due to ongoing pain in right leg.    Assessment/Plan: 1. Functional deficits which require 3+ hours per day of interdisciplinary therapy in a comprehensive inpatient rehab setting. Physiatrist is providing close team supervision and 24 hour management of active medical problems listed below. Physiatrist and rehab team continue to assess barriers to discharge/monitor patient progress toward functional and medical goals  Care Tool:  Bathing    Body parts bathed by patient: Right arm, Left arm, Chest, Abdomen, Front perineal area, Buttocks, Right upper leg, Left upper leg, Face     Body parts n/a: Right lower leg, Left lower leg   Bathing assist Assist Level: Contact Guard/Touching assist     Upper Body Dressing/Undressing Upper body dressing   What is the patient wearing?: Pull over shirt    Upper body assist Assist Level: Minimal Assistance - Patient > 75%    Lower Body Dressing/Undressing Lower body dressing      What is the patient wearing?: Pants     Lower body assist Assist for lower body dressing: Minimal Assistance - Patient > 75%     Toileting Toileting    Toileting assist Assist for toileting: Minimal Assistance - Patient > 75%     Transfers Chair/bed transfer  Transfers assist     Chair/bed transfer assist level: Moderate Assistance - Patient 50 - 74% (PA)  Locomotion Ambulation   Ambulation assist   Ambulation activity did not occur: Safety/medical concerns (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)          Walk 10 feet activity   Assist  Walk 10 feet activity did not occur: Safety/medical concerns (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)        Walk 50 feet  activity   Assist Walk 50 feet with 2 turns activity did not occur: Safety/medical concerns (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)         Walk 150 feet activity   Assist Walk 150 feet activity did not occur: Safety/medical concerns (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)         Walk 10 feet on uneven surface  activity   Assist Walk 10 feet on uneven surfaces activity did not occur: Safety/medical concerns (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 rate 16, height 4\' 8"  (1.422 m), weight 67.5 kg, SpO2 100%.  Medical Problem List and Plan: 1. 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 Someone put a prosthetic sock inside the socket of his prosthesis and then forced the pin-liner into the lock. Now the pin-liner can't be removed. They will come tomorrow to see if they can help remove the pin. Prosthesis might need to be taken apart.  2.  Antithrombotics: -DVT/anticoagulation:  Pharmaceutical: Lovenox             -antiplatelet therapy: Aspirin and Plavix for three weeks followed by aspirin alone.  Stop date for Plavix 10/10   3. Postop and phantom limb pain RLE: pt with severe pain this  afternoon -Tylenol, oxycodone as needed -add gabapentin for phantom limb pain, 100mg  TID   4. Mood/Behavior/Sleep: LCSW to evaluate and provide emotional support             -antipsychotic agents: n/a             -continue trazodone 50 mg prn sleep   5. Neuropsych/cognition: This patient is capable of making decisions on his own behalf.   6. Skin/Wound Care: Vac still in place from 9/20             -remove vac and apply black shrinker sock (ordered)             -f/u with Dr. Lajoyce Corners as outpt   7. 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 Hypotension: decrease Cozaar to 25mg  daily. Continue this dose  16.  Suboptimal vitamin D: increase D to 2,000U daily  17. Hyperkalemia: lokelma x1 administered 10/2  18. Hypoalbuminemia: educated regarding eating enough protein from whole food sources.     LOS: 2 days A FACE TO FACE EVALUATION WAS PERFORMED  Drema Pry Lissie Hinesley 07/01/2023, 10:38 AM

## 2023-07-01 NOTE — Progress Notes (Signed)
Occupational Therapy Session Note  Patient Details  Name: [name]. MRN: [id number] Date of Birth: 12/02/1950  Today's Date: 07/01/2023 OT Individual Time: 9528-4132  OT Individual Time Calculation (min): 71 min    Short Term Goals: Week 1:  OT Short Term Goal 1 (Week 1): STG = LTG 2/2 ELOS  Skilled Therapeutic Interventions/Progress Updates:  Skilled OT intervention completed with focus on ADL retraining, functional endurance, and mobility within a shower context. Pt received seated in w/c requesting to return to bed however agreeable to session when enticed by a shower. 8/10 pain reported in Rt residual limb; pre-medicated. OT offered rest breaks, repositioning and moist heat via shower for pain relief.  Pt completed all slideboard transfers with total A for board placement then mod A for transfer to various surfaces including w/c <> TTB. Cues needed for sequencing and hand placement utilizing low vision strategies.   Doffed limb guard with mod cues and min A, and total A needed to doff LLE prosthesis due to difficulty reaching the release valve. Transported dependently in w/c > shower. Completed transfer to bench using grab bar for balance.  Waterproof cover applied to Rt limb prior to shower. Pt was able to bathe all parts with intermittent supervision, at the seated level only while using grab bar for balance during lateral leans. Cues needed for locating items due to low vision. Utilized towel on board for transfer back to w/c and educated pt about technique for home if doing showers. Able to donn shirt/deo with min cues for orienting shirt. Threaded LB clothing with supervision at the w/c level. Pt attempted to laterally lean for donning pants over hips, however pt with difficulty therefore encouraged pt to trial standing. Donned LLE sleeve, then prosthesis with supervision. Max cues needed to donn RLE limb guard, and min A for orienting straps. Pt not fond of the limb guard, but  OT provided education about contracture prevention and limb positioning with the guard and pt more receptive to wearing.  Transitioned to sink, with attempt to pull up however pt with fear and unable. Transitioned to elevated bed for use of bed rail to power up. Pt required heavy mod A to power up, and was able to stand erect, but required min A for balance while OT donned pants over hips.  Pt remained agreeable to remain in w/c for lunch, with belt alarm on/activated, and with all needs in reach at end of session.   Therapy Documentation Precautions:  Precautions Precautions: Fall Precaution Comments: wound vac, legally blind in Lt eye, fistua RUE Required Braces or Orthoses: Other Brace Other Brace: RLE limb gaurd, LLE prosthesis (improper fitting) Restrictions Weight Bearing Restrictions: Yes RLE Weight Bearing: Non weight bearing LLE Weight Bearing: Weight bearing as tolerated (with prosthetic)    Therapy/Group: Individual Therapy  Jodeci Rini E Deandrea Rion, MS, OTR/L  07/01/2023, 12:12 PM

## 2023-07-01 NOTE — Progress Notes (Signed)
Physical Therapy Session Note  Patient Details  Name: [name]. MRN: [id number] Date of Birth: 06-23-1951  Today's Date: 07/01/2023 PT Individual Time: 1306-1352 PT Individual Time Calculation (min): 46 min   Short Term Goals: Week 1:  PT Short Term Goal 1 (Week 1): STG=LTG due to LOS  Skilled Therapeutic Interventions/Progress Updates: Patient supine in bed asleep on entrance to room. Patient alert with name call and agreeable to PT session.   Patient reported no pain at beginning of PT session.  Therapeutic Activity: Bed Mobility: Pt performed supine<>sit on EOB with supervision (bed flat) Transfers: Pt performed squat pivot transfer with heavy modA (to the L) from EOB to the WC. Pt heavy min/modA to transfer from [facility] to edge of mat (EOB slightly elevated). Pt transported to and from main gym in [facility] for time management.   - Pt donned L prosthesis and R limb guard with increased time required, but was able to perform with supervision.  Therapeutic Exercise: Pt performed the following exercises with therapist providing the described cuing and facilitation for improvement. - sit to stand from edge of mat x 3 with maxA. Pt with L UE to push off mat and R UE on RW. Pt presented with decrease in hip extensors and required maxA to extend through hips (VC to increase anterior weight shift), and also presented with L knee in flexion while standing with PTA facilitating extension with heavy modA due to L LE musculature needing to require increased strength to maintain WB. Pt required rest break and moved to next exercise. - LAQ on L LE with 4lb ankle weight, 2 x 10. Pt with VC for controlled eccentric portion of movement.  Patient sitting in WC at end of session with brakes locked, chair alarm set, and all needs within reach.      Therapy Documentation Precautions:  Precautions Precautions: Fall Precaution Comments: wound vac, legally blind in Lt eye, fistua RUE Required Braces or  Orthoses: Other Brace Other Brace: RLE limb gaurd, LLE prosthesis (improper fitting) Restrictions Weight Bearing Restrictions: Yes RLE Weight Bearing: Non weight bearing LLE Weight Bearing: Weight bearing as tolerated (with prosthetic)   Therapy/Group: Individual Therapy  Jakub Debold PTA 07/01/2023, 3:25 PM

## 2023-07-01 NOTE — Progress Notes (Signed)
Speech Language Pathology Daily Session Note  Patient Details  Name: Latroy Gaymon. MRN: [id number] Date of Birth: 07-29-1951  Today's Date: 07/01/2023 SLP Individual Time: 1400-1445 SLP Individual Time Calculation (min): 45 min  Short Term Goals: Week 1: SLP Short Term Goal 1 (Week 1): STGs=LTGs d/t ELOS  Skilled Therapeutic Interventions: SLP conducted skilled therapy session targeting cognitive retraining goals. Patient greeted in wheelchair, stating he was tired from previous therapy sessions, but agreeable to participate in speech therapy session. SLP facilitated task requiring sustained attention, problem solving, self-monitoring, and working memory with patient benefiting from minA overall to correctly carry out task details. Halfway through task, patient requested assistance moving back to the bed. As patient is currently transferring with +2 assistance, SLP located nursing staff to provide additional assistance. Patient exhibited reduced judgement/reasoning skills, benefiting from minA to position himself appropriately for smooth transfer. Once in bed, patient sequenced removal of limb protector independently. Finished cognitive task, again with minA overall, then patient was left in lowered bed with call bell in reach and bed alarm set. SLP will continue to target goals per plan of care.       Pain Pain Assessment Pain Scale: Faces Faces Pain Scale: Hurts little more Pain Type: Acute pain Pain Location: Leg  Therapy/Group: Individual Therapy  Jeannie Done, M.A., CCC-SLP  [name] 07/01/2023, 3:56 PM

## 2023-07-01 NOTE — Progress Notes (Signed)
I discussed findings of age indeterminate left CFV thrombus on screening venous duplex with Dr. Roda Shutters. The patient does not have leg pain, edema or fevers. Do not feel treatment is necessary for chronic DVT. Dr. Carlis Abbott aware.

## 2023-07-01 NOTE — Progress Notes (Signed)
Physical Therapy Session Note  Patient Details  Name: [name]. MRN: [id number] Date of Birth: January 13, 1951  Today's Date: 07/01/2023 PT Individual Time: 0930-1041 PT Individual Time Calculation (min): 71 min   Short Term Goals: Week 1:  PT Short Term Goal 1 (Week 1): STG=LTG due to LOS  Skilled Therapeutic Interventions/Progress Updates:   Received pt semi-reclined in bed, pt agreeable to PT treatment, and initially denied any pain but after therapist removed dressing, reported pain increased to 10/10 - RN notified and present to administer pain medication. Removed ace wrap and abdominal pad using salene solution due to incision sticking to abd pad - mild bright red drainage noted and a few areas scabbed over. Increased time spent locating wound care materials and notified MD of request for 3XL shrinker. Placed non-adherent pad, gauze, and applied ace wrap to R residual limb and donned limb guard with max A.  Removed dirty pants in supine with supervision and donned clean ones with assist to thread LEs through, otherwise able to pull over hips without assist. Pt transferred semi-reclined<>sitting R EOB from flat bed with supervision and increased time. Donned gel liners with supervision and required min A to click prosthetic in. Pt very particular about his technique for doing things, requesting to scoot all the way down to the foot of the bed and use footboard for support. Stood from EOB with RW and max A, however pt unable to come completley upright and transition L hand from footboard to RW - pt ultimately limited by fatigue and refused to attempt any additional standing.   Pt transferred bed<>WC via squat<>pivot to L with mod A. Suggested slideboard due to fatigue, however pt refused, requesting to do it his way. Sat in WC at sink and brushed teeth/washed face with set up assist (therapist assisted with putting toothpaste on toothbrush). Pt performed WC mobility 132ft using BUE and  supervision with emphasis on UE strength - occasionally bumping into items in hallway due to visual impairments. Returned to room and concluded session with pt sitting in WC, needs within reach, and chair pad alarm on. Assisted pt with calling wife.   Therapy Documentation Precautions:  Precautions Precautions: Fall Precaution Comments: wound vac, legally blind in Lt eye, fistua RUE Required Braces or Orthoses: Other Brace Other Brace: RLE limb gaurd, LLE prosthesis (improper fitting) Restrictions Weight Bearing Restrictions: Yes RLE Weight Bearing: Non weight bearing LLE Weight Bearing: Weight bearing as tolerated (with prosthetic)   Therapy/Group: Individual Therapy [name] [name] PT, DPT 07/01/2023, 7:04 AM

## 2023-07-02 LAB — GLUCOSE, CAPILLARY
Glucose-Capillary: 146 mg/dL — ABNORMAL HIGH (ref 70–99)
Glucose-Capillary: 176 mg/dL — ABNORMAL HIGH (ref 70–99)
Glucose-Capillary: 161 mg/dL — ABNORMAL HIGH (ref 70–99)
Glucose-Capillary: 164 mg/dL — ABNORMAL HIGH (ref 70–99)
Glucose-Capillary: 182 mg/dL — ABNORMAL HIGH (ref 70–99)

## 2023-07-02 MED ORDER — INSULIN GLARGINE-YFGN 100 UNIT/ML ~~LOC~~ SOLN
11.0000 [IU] | Freq: Every day | SUBCUTANEOUS | Status: DC
  Administered 2023-07-03 – 2023-07-04 (×2): 11 [IU] via SUBCUTANEOUS
  Filled 2023-07-02 (×2): qty 0.11

## 2023-07-02 MED ORDER — AMLODIPINE BESYLATE 5 MG PO TABS
5.0000 mg | ORAL_TABLET | Freq: Every day | ORAL | Status: DC
  Administered 2023-07-03 – 2023-07-05 (×3): 5 mg via ORAL
  Filled 2023-07-02 (×3): qty 1

## 2023-07-02 NOTE — Progress Notes (Signed)
Physical Therapy Session Note  Patient Details  Name: [name]. MRN: [id number] Date of Birth: [birth year]  Today's Date: 07/02/2023 PT Individual Time: 3086-5784 and 6962-9528 PT Individual Time Calculation (min): 40 min and 41 min  Short Term Goals: Week 1:  PT Short Term Goal 1 (Week 1): STG=LTG due to LOS  Skilled Therapeutic Interventions/Progress Updates:   Treatment Session 1 Received pt semi-reclined in bed asleep. Upon wakening, pt agreeable to PT treatment and reported pain 9/10 in R residual limb - RN notified and present to administer medication and inspect limb and pt required significantly increased time to take meds. Noted shrinkers still not in room - notified MD. Session with emphasis on functional mobility/transfers, generalized strengthening and endurance, and limb care. Removed current dressing and pt performed x10 RLE SLR while waiting for RN to come inspect leg. Rewrapped limb using non-adherent, gauze, and ace wrap - noted mild drainage but healing well overall. Donned limb guard with max A for time management purposes. Donned gel liners with supervision and pt transferred semi-reclined<>sitting EOB with HOB elevated and supervision. Pt required min A to click prosthetic in for time management purposes. Pt transferred bed<>WC via slideboard with min A and set up up at sink to wash face/brush teeth. Concluded session with pt sitting in WC, needs within reach, and chair pad alarm on. NT notified of pt's current status.   Treatment Session 2 Received pt semi-reclined in bed making phone call, then dietary arrived to take dinner order. Pt agreeable to PT treatment and denied any pain during session. Session with emphasis on functional mobility/transfers, limb loss education, and dynamic standing balance/coordination. Donned gel liners with supervision and limb guard with max A. Pt transferred semi-reclined<>sitting EOB with HOB elevated and supervision. Clicked in  prosthetic with assist to position prosthetic. Scott from [place] arrived with 3XL shrinkers - removed dressing/ace wrap and donned 3XL shrinker with total A but shrinker too loose. Donned limb guard and pt returned to sitting EOB with supervision. Stood from elevated EOB with RW and mod A x 5 trials and worked on static standing balance and upright posture/gaze. Scott present to bring 2XL shrinker and replaced 3XL with 2XL shrinker for additional compression, although pt could probably go down to XL. NT arrived to check vitals and pt requested to lie back down. Removed limb guard and prosthetic with total A and pt able to remove liners independently. Transitioned into supine with supervision and concluded session with pt semi-reclined in bed, needs within reach, and bed alarm on.   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

## 2023-07-02 NOTE — IPOC Note (Signed)
Overall Plan of Care (IPOC) Patient Details Name: [name]. MRN: [id number] DOB: [birth year]  Admitting Diagnosis: CVA (cerebral vascular accident) [facility])  Hospital Problems: Principal Problem:   CVA (cerebral vascular accident) [facility])     Functional Problem List: Nursing Skin Integrity, Nutrition, Medication Management, Safety, Pain, Bowel  PT Balance, Edema, Endurance, Motor, Pain, Nutrition, Skin Integrity  OT Balance, Edema, Endurance, Motor, Pain, Vision, Sensory  SLP Cognition  TR         Basic ADL's: OT Bathing, Dressing, Toileting     Advanced  ADL's: OT       Transfers: PT Bed Mobility, Bed to Chair, Car, Occupational psychologist, Research scientist (life sciences): PT Ambulation, Psychologist, prison and probation services, Stairs     Additional Impairments: OT None  SLP Social Cognition   Problem Solving, Memory, Attention, Awareness  TR      Anticipated Outcomes Item Anticipated Outcome  Self Feeding Set up A  Swallowing      Basic self-care  Supervision  Toileting  Supervision   Bathroom Transfers Supervision/CGA  Bowel/Bladder  manage bowel with mod I assist  Transfers  supervision with LRAD  Locomotion  N/A  Communication  MinA  Cognition  MinA  Pain  less than 4 with PRNs  Safety/Judgment  manage with cues   Therapy Plan: PT Intensity: Minimum of 1-2 x/day ,45 to 90 minutes PT Frequency: 5 out of 7 days PT Duration Estimated Length of Stay: 7-10 days OT Intensity: Minimum of 1-2 x/day, 45 to 90 minutes OT Frequency: 5 out of 7 days OT Duration/Estimated Length of Stay: 7-10 days SLP Intensity: Minumum of 1-2 x/day, 30 to 90 minutes SLP Frequency: 3 to 5 out of 7 days SLP Duration/Estimated Length of Stay: 10/10   Team Interventions: Nursing Interventions Patient/Family Education, Pain Management, Medication Management, Bowel Management, Skin Care/Wound Management, Discharge Planning, Disease Management/Prevention  PT interventions Discharge planning,  Functional mobility training, Psychosocial support, Therapeutic Activities, Visual/perceptual remediation/compensation, Balance/vestibular training, Disease management/prevention, Neuromuscular re-education, Skin care/wound management, Therapeutic Exercise, Wheelchair propulsion/positioning, Cognitive remediation/compensation, DME/adaptive equipment instruction, Splinting/orthotics, UE/LE Strength taining/ROM, Pain management, Community reintegration, Functional electrical stimulation, Patient/family education, Museum/gallery curator, UE/LE Coordination activities  OT Interventions Warden/ranger, Discharge planning, Pain management, Self Care/advanced ADL retraining, Therapeutic Activities, UE/LE Coordination activities, Visual/perceptual remediation/compensation, Therapeutic Exercise, Skin care/wound managment, Patient/family education, Functional mobility training, Disease mangement/prevention, DME/adaptive equipment instruction, Neuromuscular re-education, Psychosocial support, Splinting/orthotics, UE/LE Strength taining/ROM, Wheelchair propulsion/positioning  SLP Interventions Cognitive remediation/compensation, Environmental controls, Speech/Language facilitation, Financial trader, Functional tasks, Therapeutic Activities, Internal/external aids, Patient/family education  TR Interventions    SW/CM Interventions Discharge Planning, Patient/Family Education, Disease Management/Prevention, Psychosocial Support   Barriers to Discharge MD  Medical stability  Nursing Home environment access/layout, Wound Care One level, 5 steps to entry, home with wife  PT Decreased caregiver support, Wound Care, Weight bearing restrictions, Other (comments) pain, wife works during the day, wound care, improper fitting prosthetic  OT Wound Care, Home environment access/layout, Weight bearing restrictions    SLP      SW Insurance for SNF coverage     Team Discharge Planning: Destination: PT-Home ,OT- Home ,  SLP-Home Projected Follow-up: PT-Home health PT, OT-  Home health OT, SLP-24 hour supervision/assistance, Home Health SLP Projected Equipment Needs: PT-To be determined, OT- To be determined, SLP-None recommended by SLP Equipment Details: PT-will need WC and RW, OT-  Patient/family involved in discharge planning: PT- Patient, Family member/caregiver,  OT-Patient, SLP-Patient  MD ELOS: 9 days Medical Rehab Prognosis:  Excellent Assessment: The patient has been admitted for CIR therapies with the diagnosis of CVA. The team will be addressing functional mobility, strength, stamina, balance, safety, adaptive techniques and equipment, self-care, bowel and bladder mgt, patient and caregiver education. Goals have been set at [employer]. Anticipated discharge destination is home.         See Team Conference Notes for weekly updates to the plan of care

## 2023-07-02 NOTE — Progress Notes (Signed)
Orthopedic Tech Progress Note Patient Details:  [name]. 03-Sep-1951 [id number]  Order has been called in to [facility] 3 times in the last 3 days. For [name] for a BKA   Patient ID: [name]., male   DOB: [birth year], 72 y.o.   MRN: [id number]  Donald Pore 07/02/2023, 8:50 PM

## 2023-07-02 NOTE — Progress Notes (Addendum)
Patient ID: [name] Husted., male   DOB: [birth year], 72 y.o.   MRN: [id number]  [facility] referral submitted to [facility]  Patient approved PT/OT.

## 2023-07-02 NOTE — Progress Notes (Signed)
PROGRESS NOTE   Subjective/Complaints: C/o that the limb guard is too heavy and would like it to be removed, asked nursing to remove it temporarily.  Shrinkers ordered   ROS: denies pain, constipation, feels limb guard is too heavy   Objective:   VAS [place] LOWER EXTREMITY VENOUS (DVT)  Result Date: 06/30/2023  Lower Venous DVT Study Patient Name:  [name].  Date of Exam:   06/30/2023 Medical Rec #: [id number]           Accession #:    [id number] Date of Birth: 03/02/1951           Patient Gender: M Patient Age:   72 years Exam Location:  [facility] Procedure:      VAS [place] LOWER EXTREMITY VENOUS (DVT) Referring Phys: Wendi Maya --------------------------------------------------------------------------------  Indications: Edema.  Risk Factors: Immobility Surgery RT BKA 06/18/23. Anticoagulation: Lovenox. Limitations: Bilateral BKA. Comparison Study: No prior study Performing Technologist: Shona Simpson  Examination Guidelines: A complete evaluation includes B-mode imaging, spectral Doppler, color Doppler, and power Doppler as needed of all accessible portions of each vessel. Bilateral testing is considered an integral part of a complete examination. Limited examinations for reoccurring indications may be performed as noted. The reflux portion of the exam is performed with the patient in reverse Trendelenburg.  +---------+---------------+---------+-----------+----------+--------------+ RIGHT    CompressibilityPhasicitySpontaneityPropertiesThrombus Aging +---------+---------------+---------+-----------+----------+--------------+ CFV      Full           Yes      Yes                                 +---------+---------------+---------+-----------+----------+--------------+ SFJ      Full                                                        +---------+---------------+---------+-----------+----------+--------------+  FV Prox  Full                                                        +---------+---------------+---------+-----------+----------+--------------+ FV Mid   Full                                                        +---------+---------------+---------+-----------+----------+--------------+ FV DistalFull                                                        +---------+---------------+---------+-----------+----------+--------------+ PFV      Full                                                        +---------+---------------+---------+-----------+----------+--------------+  POP      Full           Yes      Yes                                 +---------+---------------+---------+-----------+----------+--------------+   +---------+---------------+---------+-----------+---------------+--------------+ LEFT     CompressibilityPhasicitySpontaneityProperties     Thrombus Aging +---------+---------------+---------+-----------+---------------+--------------+ CFV      Full           Yes      Yes                                      +---------+---------------+---------+-----------+---------------+--------------+ SFJ      Full                                                             +---------+---------------+---------+-----------+---------------+--------------+ FV Prox  Full                                                             +---------+---------------+---------+-----------+---------------+--------------+ FV Mid   Partial                            rigid          Age                                                        w/compression  Indeterminate  +---------+---------------+---------+-----------+---------------+--------------+ FV DistalPartial                            rigid          Age                                                        w/compression  Indeterminate   +---------+---------------+---------+-----------+---------------+--------------+ PFV      Full                                                             +---------+---------------+---------+-----------+---------------+--------------+ POP      Full           Yes      Yes                                      +---------+---------------+---------+-----------+---------------+--------------+  Summary: RIGHT: - There is no evidence of deep vein thrombosis in the lower extremity.  - No cystic structure found in the popliteal fossa.  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  - No cystic structure found in the popliteal fossa.  *See table(s) above for measurements and observations. Electronically signed by Sherald Hess MD on 06/30/2023 at 3:52:31 PM.    Final    Recent Labs    06/30/23 0517  WBC 7.8  HGB 8.6*  HCT 27.6*  PLT 264   Recent Labs    06/30/23 0517  NA 136  K 5.2*  CL 101  CO2 25  GLUCOSE 154*  BUN 17  CREATININE 1.21  CALCIUM 9.5    Intake/Output Summary (Last 24 hours) at 07/02/2023 0953 Last data filed at 07/02/2023 0755 Gross per 24 hour  Intake 712 ml  Output 850 ml  Net -138 ml        Physical Exam: Vital Signs Blood pressure (!) 129/55, pulse 81, temperature 98.1 F (36.7 C), temperature source Oral, resp. rate 20, height 4\' 8"  (1.422 m), weight 67.5 kg, SpO2 100%. Gen: no distress, normal appearing HEENT: oral mucosa pink and moist, NCAT Cardio: Reg rate Chest: normal effort, normal rate of breathing Abd: soft, non-distended Ext: no edema Psych: pleasant, flat affect  Musculoskeletal:     Cervical back: Normal range of motion.     Comments: Right BK stump in KI, vac attached, no drainage in cannister  Skin:    Comments: Left residual limb intact and appropriately shaped  Neurological:     Mental Status: He is alert.     Comments: Alert and oriented x 3. Normal insight and  awareness. Intact Memory. Normal language and speech. Cranial nerve exam unremarkable although pt is blind in his left eye. MMT: RUE 5/5, 4+/5 LUE. RLE limited by pain. LLE 4/5 HF, KE. Sensory exam normal for light touch and pain in all 4 limbs. No limb ataxia or cerebellar signs. No abnormal tone appreciated.  Marland Kitchen    Psychiatric:     Comments: Pt upset due to ongoing pain in right leg.    Assessment/Plan: 1. Functional deficits which require 3+ hours per day of interdisciplinary therapy in a comprehensive inpatient rehab setting. Physiatrist is providing close team supervision and 24 hour management of active medical problems listed below. Physiatrist and rehab team continue to assess barriers to discharge/monitor patient progress toward functional and medical goals  Care Tool:  Bathing    Body parts bathed by patient: Right arm, Left arm, Chest, Abdomen, Front perineal area, Buttocks, Right upper leg, Left upper leg, Face     Body parts n/a: Right lower leg, Left lower leg   Bathing assist Assist Level: Supervision/Verbal cueing     Upper Body Dressing/Undressing Upper body dressing   What is the patient wearing?: Pull over shirt    Upper body assist Assist Level: Supervision/Verbal cueing    Lower Body Dressing/Undressing Lower body dressing      What is the patient wearing?: Pants     Lower body assist Assist for lower body dressing: Minimal Assistance - Patient > 75%     Toileting Toileting    Toileting assist Assist for toileting: Minimal Assistance - Patient > 75%     Transfers Chair/bed transfer  Transfers assist     Chair/bed transfer assist level: Minimal Assistance - Patient > 75% (slideboard)     Locomotion Ambulation   Ambulation  assist   Ambulation activity did not occur: Safety/medical concerns (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)          Walk 10 feet activity   Assist  Walk 10 feet activity did not occur: Safety/medical  concerns (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)        Walk 50 feet activity   Assist Walk 50 feet with 2 turns activity did not occur: Safety/medical concerns (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)         Walk 150 feet activity   Assist Walk 150 feet activity did not occur: Safety/medical concerns (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)         Walk 10 feet on uneven surface  activity   Assist Walk 10 feet on uneven surfaces activity did not occur: Safety/medical concerns (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)         Wheelchair     Assist Is the patient using a wheelchair?: Yes Type of Wheelchair: Manual Wheelchair activity did not occur: Safety/medical concerns  Wheelchair assist level: Supervision/Verbal cueing Max wheelchair distance: 120ft    Wheelchair 50 feet with 2 turns activity    Assist    Wheelchair 50 feet with 2 turns activity did not occur: Safety/medical concerns   Assist Level: Supervision/Verbal cueing   Wheelchair 150 feet activity     Assist  Wheelchair 150 feet activity did not occur: Safety/medical concerns   Assist Level: Supervision/Verbal cueing   Blood pressure (!) 129/55, pulse 81, temperature 98.1 F (36.7 C), temperature source Oral, resp. rate 20, height 4\' 8"  (1.422 m), weight 67.5 kg, SpO2 100%.  Medical Problem List and Plan: 1. 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  Impaired mobility/chronic DVT: continue Lovenox             -antiplatelet therapy: Aspirin and Plavix for three weeks followed by aspirin alone.  Stop date for Plavix 10/10   3. Postop and phantom limb pain RLE: pt with severe pain this afternoon -Tylenol, oxycodone as needed -add gabapentin for  phantom limb pain, 100mg  TID   4. Mood/Behavior/Sleep: LCSW to evaluate and provide emotional support             -antipsychotic agents: n/a             -continue trazodone 50 mg prn sleep   5. Neuropsych/cognition: This patient is capable of making decisions on his own behalf.   6. Skin/Wound Care: Vac still in place from 9/20             -remove vac and apply black shrinker sock (ordered)             -f/u with Dr. Lajoyce Corners as outpt   7. 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 Hypotension: decrease Cozaar to 25mg  daily. Continue this dose. Decrease Cozaar to 5mg  daily.   16.  Suboptimal vitamin D: increase D3 to 3,000U daily  17. Hyperkalemia: lokelma x1 administered 10/2  18. Hypoalbuminemia: educated regarding eating enough protein from whole food sources.      LOS: 3 days A FACE TO FACE EVALUATION WAS PERFORMED  Drema Pry Jered Heiny 07/02/2023, 9:53 AM

## 2023-07-02 NOTE — Progress Notes (Signed)
Occupational Therapy Session Note  Patient Details  Name: [name]. MRN: [id number] Date of Birth: 1951-07-11  Today's Date: 07/02/2023 OT Individual Time: 1035-1200 OT Individual Time Calculation (min): 85 min    Short Term Goals: Week 1:  OT Short Term Goal 1 (Week 1): STG = LTG 2/2 ELOS  Skilled Therapeutic Interventions/Progress Updates:  Skilled OT intervention completed with focus on bilateral amputation/core exercises and contracture prevention. Pt received supine in bed, asleep, but easily aroused and agreeable to session. 8/10 pain reported in Rt residual limb; declined meds. OT offered rest breaks, repositioning, and residual limb stretching for pain reduction.  Transitioned to EOB with bed flat and supervision. Total A needed to place board, then CGA transfer with mod cues for positioning and hand placement for low vision strategies > w/c towards Rt side. Pt declined wearing the limb guard due to pain. Re-educated pt about purpose for safety and positioning however pt declined.  Transported dependently in w/c > gym. Total A board placement then CGA transfer on board > EOM with same cues. Transitioned to supine with supervision. Pillow provided for comfort.  Pt completed the following exercises to address bilateral residual limb strengthening and contracture prevention needed for proper wear and biomechanics of a prosthetic: Supine -RLE knee extension with limb on bolster for greater extension, 5 sec hold 2 x15 -RLE hip flexion Lt sidelying -hip abduction 2x15 -RLE knee flexion & extension x15 Rt sidelying -hip abduction 2x15 -LLE knee flexion & extension x15 Prone -propped on elbows for passive hip extension hold for 2 mins, with cues needed for stabilization at hip -bilateral residual limb knee flexion & extension x15, however pt with significant hamstring weakness and unable to achieve full 90 degrees  (HEP issued to pt on the above and other exercises at bed  level)  Transitioned to EOM with supervision. Sitting EOM pt completed the following exercises for core strengthening needed for postural stability with 5 lb med ball: -russian twists x20 -modified crunches x15 with stabilization needed at knees -reclined chest press x15  Instructed pt to place slide board, with mod cues needed but supervision A. Transferred on board with min A > w/c. Back in room, total A for board placement then CGA transfer on board > EOB. Transitioned supine with mod I. Pt remained supine in bed, with bed alarm on/activated, and with all needs in reach at end of session.   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

## 2023-07-02 NOTE — Progress Notes (Signed)
Speech Language Pathology Daily Session Note  Patient Details  Name: [name]. MRN: [id number] Date of Birth: 06-17-1951  Today's Date: 07/02/2023 SLP Individual Time: 0915-0930 SLP Individual Time Calculation (min): 15 min and Today's Date: 07/02/2023 SLP Missed Time: 30 Minutes Missed Time Reason: Pain  Short Term Goals: Week 1: SLP Short Term Goal 1 (Week 1): STGs=LTGs d/t ELOS  Skilled Therapeutic Interventions: Skilled treatment session focused on cognitive goals. Upon arrival, patient was awake while upright in the wheelchair and was in the process of positioning himself in order to transfer back to bed. SLP educated on the importance of calling for help which patient was in the process of upon SLP arrival. Patient reported 10/10 pain from the limb protector being "too heavy" over his right residual limb. The limb protector was removed and patient reported minimal relief and continued to request to transfer back to bed. Nursing made aware of pain and patient had been premedicated. Patient transferred back to bed and requested to lay flat. SLP attempted to engage patient in SLP but patient requested to rest due to pain. Therefore, patient missed remaining 30 minutes of session due to pain. Patient left in bed with alarm on and all needs within reach. Continue with current plan of care.      Pain Pain Assessment Pain Scale: 0-10 Pain Score: 10 Right Residual Limb Nursing aware, patient premedicated, medications administered   Therapy/Group: Individual Therapy  Marcelia Petersen 07/02/2023, 12:14 PM

## 2023-07-02 NOTE — Progress Notes (Signed)
Patient ID: Esco Joslyn., male   DOB: 09-09-1951, 71 y.o.   MRN: [id number]  SW received phone call from pt [facility] SW- Herbert Seta ([phone] ext [phone]) reporting the veteran called to inform he was in the hospital. Extending assistance if needed. Reports he is non service connected, and eligible for [facility], home health aide, and DME. [facility] PCP- Dr. Ellin Mayhew, fax #[phone].  Cecile Sheerer, MSW, LCSWA Office: [phone] Cell: [phone] Fax: [phone]

## 2023-07-02 NOTE — Progress Notes (Signed)
Patient ID: [name]., male   DOB: 05-Oct-1950, 72 y.o.   MRN: [id number]   Drop arm commode and Wheelchair ordered through [employer].

## 2023-07-03 DIAGNOSIS — E1165 Type 2 diabetes mellitus with hyperglycemia: Secondary | ICD-10-CM

## 2023-07-03 LAB — GLUCOSE, CAPILLARY
Glucose-Capillary: 165 mg/dL — ABNORMAL HIGH (ref 70–99)
Glucose-Capillary: 173 mg/dL — ABNORMAL HIGH (ref 70–99)
Glucose-Capillary: 154 mg/dL — ABNORMAL HIGH (ref 70–99)
Glucose-Capillary: 206 mg/dL — ABNORMAL HIGH (ref 70–99)

## 2023-07-03 NOTE — Progress Notes (Signed)
Physical Therapy Session Note  Patient Details  Name: [name]. MRN: [id number] Date of Birth: 1951-05-03  Today's Date: 07/03/2023 PT Individual Time: 1246-1358 PT Individual Time Calculation (min): 72 min   Short Term Goals: Week 1:  PT Short Term Goal 1 (Week 1): STG=LTG due to LOS  Skilled Therapeutic Interventions/Progress Updates:   Received pt semi-reclined in bed asleep. Upon wakening, pt agreeable to PT treatment and denied any pain during session. Session with emphasis on functional mobility/transfers, generalized strengthening and endurance, dynamic standing balance/coordination, and limb loss education. Provided pt with information on amputee support group. Pt transferred semi-reclined<>sitting R EOB with HOB elevated and supervision. Pt able to don gel liners independently and click in prosthetic with set up assist but required assist to don limb guard. Pt transferred sit<>stand x 5 reps from elevated EOB with RW and mod A - cues for hand placement, anterior weight shifting, and L foot placement. Stood final time from EOB with RW and mod A and performed stand<>pivot bed<>WC with RW and min/mod A.   Pt performed WC mobility 125ft using BUE and suprevision to main therapy gym with emphasis on UE strength. Pt then navigated through obstacle course consisting of weaving in/out of cones using BUE and supervision with therapist on back of WC for additional challenge. Removed limb guard for comfort and pt performed the following exercises with emphasis on LE/UE and core strength: -hip flexion 2x10 bilaterally with 2.5lb ankle weight on LLE -knee extension 2x10 bilaterally with 2.5lb ankle weight on LLE -hip abduction with grn TB 2x15 -lateral trunk rotations with 4lb medicine ball 2x10 bilaterally  -WC pushups 1x5 and 1x4 -bicep curls with 4lb dumbbell 1x10 bilaterally  Pt transported back to room in [facility] dependently and requested to return to bed. Pt required x 2 attempts and max A  to stand from [facility] (cues for hand placement) and performed stand<>pivot WC<>bed with RW and mod A - pt unable to turn completley around to back up to bed, requiring assist from therapist to safely place hips on bed. Pt required assist to remove prosthetic but able to remove liners independently. Pt transferred sit<>supine with supervision and voided in urinal with set up assist. Concluded session with pt semi-reclined in bed, needs within reach, and bed alarm on.   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

## 2023-07-03 NOTE — Progress Notes (Signed)
Speech Language Pathology Daily Session Note  Patient Details  Name: [name]. MRN: [id number] Date of Birth: [birth year]  Today's Date: 07/03/2023 SLP Individual Time: 0915-1015 SLP Individual Time Calculation (min): 60 min  Short Term Goals: Week 1: SLP Short Term Goal 1 (Week 1): STGs=LTGs d/t ELOS  Skilled Therapeutic Interventions: Skilled therapy session focused on problem solving goals. Upon entrance, patient oriented x4 independently. SLP faciliated session through providing minA during money management task. Patient was prompted to count coins according to verbalized amount. Patient benefited from seperating coins according to amount due to poor eyesight. During session, nurse entered to administer mediciation. Patient consumed all medications whole in water with no s/sx of aspiration. Recommend continuation of current diet. At the conclusion of the session, SLP educated patient on dysarthria strategies to improve speech intelligibility and provided a handout for room. Patient left in bed with call bell and alarm set. Continue POC  Pain No pain reported   Therapy/Group: Individual Therapy  Channie Bostick M.A., CF-SLP 07/03/2023, 10:20 AM

## 2023-07-03 NOTE — Progress Notes (Signed)
Occupational Therapy Session Note  Patient Details  Name: [name]. MRN: [id number] Date of Birth: [birth year]  Today's Date: 07/03/2023 OT Individual Time: 0800-0900 OT Individual Time Calculation (min): 60 min    Short Term Goals: Week 1:  OT Short Term Goal 1 (Week 1): STG = LTG 2/2 ELOS  Skilled Therapeutic Interventions/Progress Updates:   Pt seen for skilled OT session this am. Full am self care retraining session with shower with strong focus on depression lifts for transfers and transfer board placement with each. Pt assisted to waterproof R residual limb. Lateral transfer to w/c from bed with CGA for supine to sit, EOB sitting initially. Mod A for board placement for bed transfer to and from w/c but max A for on and off TTB. Overall CGA with mod cues for full scoot/depression lifts vs slides across transfer board. Lateral leans for buttocks and peri bathing with CGA and set up otherwise for shower level bathing. Sink side grooming with set up, UB pullover shirt with set up. Pt requested back to bed for more independence in LB dressing and for rest prior to later sessions. Min A for boxer briefs with lateral leans bed leble. Requested to defer long pants donning until later due to ease for use of urinal. Left pt bed level with bed alarm, nurse call button and needs in reach.    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

## 2023-07-03 NOTE — Progress Notes (Addendum)
PROGRESS NOTE   Subjective/Complaints: Pt upset bout interactions with a recent nurse. Asked not to cared for by the nurse again. Otherwise he is pleased with therapy and team  ROS: Patient denies fever, rash, sore throat, blurred vision, dizziness, nausea, vomiting, diarrhea, cough, shortness of breath or chest pain,   headache, or mood change.    Objective:   No results found. No results for input(s): "WBC", "HGB", "HCT", "PLT" in the last 72 hours.  No results for input(s): "NA", "K", "CL", "CO2", "GLUCOSE", "BUN", "CREATININE", "CALCIUM" in the last 72 hours.   Intake/Output Summary (Last 24 hours) at 07/03/2023 1029 Last data filed at 07/03/2023 0742 Gross per 24 hour  Intake 957 ml  Output 750 ml  Net 207 ml        Physical Exam: Vital Signs Blood pressure (!) 116/59, pulse 75, temperature 98.7 F (37.1 C), resp. rate 18, height 4\' 8"  (1.422 m), weight 67.5 kg, SpO2 100%. Constitutional: No distress . Vital signs reviewed. HEENT: NCAT, EOMI, oral membranes moist Neck: supple Cardiovascular: RRR without murmur. No JVD    Respiratory/Chest: CTA Bilaterally without wheezes or rales. Normal effort    GI/Abdomen: BS +, non-tender, non-distended Ext: no clubbing, cyanosis, or edema Psych: pleasant and cooperative   Musculoskeletal:     Cervical back: Normal range of motion.     Comments: Right BK stump with shrinker. Skin:    Comments: Left residual limb intact and appropriately shaped  Neurological:     Mental Status: He is alert.     Comments: Alert and oriented x 3. Normal insight and awareness. Intact Memory. Normal language and speech. Cranial nerve exam unremarkable although pt is blind in his left eye. MMT: RUE 5/5, 4+ to 5/5 LUE. RLE limited by pain. LLE 4 to 4+/5 HF, KE. Sensory exam normal for light touch and pain in all 4 limbs. No limb ataxia or cerebellar signs. No abnormal tone appreciated.  .       Assessment/Plan: 1. Functional deficits which require 3+ hours per day of interdisciplinary therapy in a comprehensive inpatient rehab setting. Physiatrist is providing close team supervision and 24 hour management of active medical problems listed below. Physiatrist and rehab team continue to assess barriers to discharge/monitor patient progress toward functional and medical goals  Care Tool:  Bathing    Body parts bathed by patient: Right arm, Left arm, Chest, Abdomen, Front perineal area, Buttocks, Right upper leg, Left upper leg, Face     Body parts n/a: Right lower leg, Left lower leg   Bathing assist Assist Level: Supervision/Verbal cueing     Upper Body Dressing/Undressing Upper body dressing   What is the patient wearing?: Pull over shirt    Upper body assist Assist Level: Supervision/Verbal cueing    Lower Body Dressing/Undressing Lower body dressing      What is the patient wearing?: Pants     Lower body assist Assist for lower body dressing: Minimal Assistance - Patient > 75%     Toileting Toileting    Toileting assist Assist for toileting: Minimal Assistance - Patient > 75%     Transfers Chair/bed transfer  Transfers assist  Chair/bed transfer assist level: Minimal Assistance - Patient > 75% (slideboard)     Locomotion Ambulation   Ambulation assist   Ambulation activity did not occur: Safety/medical concerns (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)          Walk 10 feet activity   Assist  Walk 10 feet activity did not occur: Safety/medical concerns (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)        Walk 50 feet activity   Assist Walk 50 feet with 2 turns activity did not occur: Safety/medical concerns (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)         Walk 150 feet activity   Assist Walk 150 feet activity did not occur: Safety/medical concerns (bilateral BKA with improper fitting L  prosthetic)         Walk 10 feet on uneven surface  activity   Assist Walk 10 feet on uneven surfaces activity did not occur: Safety/medical concerns (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)         Wheelchair     Assist Is the patient using a wheelchair?: Yes Type of Wheelchair: Manual Wheelchair activity did not occur: Safety/medical concerns  Wheelchair assist level: Supervision/Verbal cueing Max wheelchair distance: 117ft    Wheelchair 50 feet with 2 turns activity    Assist    Wheelchair 50 feet with 2 turns activity did not occur: Safety/medical concerns   Assist Level: Supervision/Verbal cueing   Wheelchair 150 feet activity     Assist  Wheelchair 150 feet activity did not occur: Safety/medical concerns   Assist Level: Supervision/Verbal cueing   Blood pressure (!) 116/59, pulse 75, temperature 98.7 F (37.1 C), resp. rate 18, height 4\' 8"  (1.422 m), weight 67.5 kg, SpO2 100%.  Medical Problem List and Plan: 1. 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  Impaired mobility/chronic DVT: continue Lovenox             -antiplatelet therapy: Aspirin and Plavix for three weeks followed by aspirin alone.  Stop date for Plavix 10/10   3. Postop and phantom limb pain RLE: pt with severe pain this afternoon -Tylenol, oxycodone as needed -add gabapentin for phantom limb pain, 100mg  TID   4. Mood/Behavior/Sleep: LCSW to evaluate and provide emotional support             -antipsychotic agents: n/a             -continue trazodone 50 mg prn sleep   5. Neuropsych/cognition: This patient is capable of making decisions on his own behalf.   6. Skin/Wound Care:               - black shrinker sock               -f/u with Dr. Lajoyce Corners as outpt   7. Fluids/Electrolytes/Nutrition: Routine Is and Os and follow-up chemistries             -continue zinc, Vit D, C supplements             -carb modified diet   8: Hypertension: monitor  TID and prn             -continue amlodipine 5 mg daily             -decrease losartan to 25mg  daily   10/5 reasonable control at present 9: Hyperlipidemia: continue statin     11: DM: A1c = 11.1%; CBGs QID             -continue SSI             -increase semglee to 10U  -advised to avoid added sugar  CBG (last 3)  Recent Labs    07/02/23 2053 07/02/23 2110 07/03/23 0608  GLUCAP 164* 182* 165*   10/5 fair control today--may need adjustment of semglee  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 Hypotension: decrease Cozaar to 25mg  daily. Continue this dose. Decrease Cozaar to 5mg  daily.   16.  Suboptimal vitamin D: increase D3 to 3,000U daily  17. Hyperkalemia: lokelma x1 administered 10/2  18. Hypoalbuminemia: educated regarding eating enough protein from whole food sources.      LOS: 4 days A FACE TO FACE EVALUATION WAS PERFORMED  Ranelle Oyster 07/03/2023, 10:29 AM

## 2023-07-04 DIAGNOSIS — Z794 Long term (current) use of insulin: Secondary | ICD-10-CM

## 2023-07-04 LAB — GLUCOSE, CAPILLARY
Glucose-Capillary: 193 mg/dL — ABNORMAL HIGH (ref 70–99)
Glucose-Capillary: 174 mg/dL — ABNORMAL HIGH (ref 70–99)
Glucose-Capillary: 166 mg/dL — ABNORMAL HIGH (ref 70–99)
Glucose-Capillary: 101 mg/dL — ABNORMAL HIGH (ref 70–99)

## 2023-07-04 MED ORDER — INSULIN GLARGINE-YFGN 100 UNIT/ML ~~LOC~~ SOLN
3.0000 [IU] | Freq: Once | SUBCUTANEOUS | Status: AC
  Administered 2023-07-04: 3 [IU] via SUBCUTANEOUS
  Filled 2023-07-04: qty 0.03

## 2023-07-04 MED ORDER — INSULIN GLARGINE-YFGN 100 UNIT/ML ~~LOC~~ SOLN
14.0000 [IU] | Freq: Every day | SUBCUTANEOUS | Status: DC
  Administered 2023-07-05 – 2023-07-06 (×2): 14 [IU] via SUBCUTANEOUS
  Filled 2023-07-04 (×2): qty 0.14

## 2023-07-04 NOTE — Progress Notes (Signed)
PROGRESS NOTE   Subjective/Complaints: Pt in good spirits today. Had productive therapy yesterday. Nursing has been great since we last spoke.   ROS: Patient denies fever, rash, sore throat, blurred vision, dizziness, nausea, vomiting, diarrhea, cough, shortness of breath or chest pain, joint or back/neck pain, headache, or mood change.    Objective:   No results found. No results for input(s): "WBC", "HGB", "HCT", "PLT" in the last 72 hours.  No results for input(s): "NA", "K", "CL", "CO2", "GLUCOSE", "BUN", "CREATININE", "CALCIUM" in the last 72 hours.   Intake/Output Summary (Last 24 hours) at 07/04/2023 0914 Last data filed at 07/04/2023 4627 Gross per 24 hour  Intake 714 ml  Output 1125 ml  Net -411 ml        Physical Exam: Vital Signs Blood pressure (!) 149/59, pulse 83, temperature 97.7 F (36.5 C), resp. rate 15, height 4\' 8"  (1.422 m), weight 67.5 kg, SpO2 100%. Constitutional: No distress . Vital signs reviewed. HEENT: NCAT, EOMI, oral membranes moist Neck: supple Cardiovascular: RRR without murmur. No JVD    Respiratory/Chest: CTA Bilaterally without wheezes or rales. Normal effort    GI/Abdomen: BS +, non-tender, non-distended Ext: no clubbing, cyanosis, or edema Psych: pleasant and cooperative  Musculoskeletal:     Cervical back: Normal range of motion.     Comments: Right BK stump with shrinker in place Skin:    Comments: Left residual limb intact and appropriately shaped  Neurological:     Mental Status: He is alert.     Comments: Alert and oriented x 3. Normal insight and awareness. Intact Memory. Normal language and speech. Cranial nerve exam unremarkable although pt is blind in his left eye. MMT: RUE 5/5, 4+ to 5/5 LUE. RLE limited by pain. LLE 4 to 4+/5 HF, KE. Sensory exam normal for light touch and pain in all 4 limbs. No limb ataxia or cerebellar signs. No abnormal tone appreciated. Neurological  exam stable today 07/04/2023       Assessment/Plan: 1. Functional deficits which require 3+ hours per day of interdisciplinary therapy in a comprehensive inpatient rehab setting. Physiatrist is providing close team supervision and 24 hour management of active medical problems listed below. Physiatrist and rehab team continue to assess barriers to discharge/monitor patient progress toward functional and medical goals  Care Tool:  Bathing    Body parts bathed by patient: Right arm, Left arm, Chest, Abdomen, Front perineal area, Buttocks, Right upper leg, Left upper leg, Face     Body parts n/a: Right lower leg, Left lower leg   Bathing assist Assist Level: Supervision/Verbal cueing     Upper Body Dressing/Undressing Upper body dressing   What is the patient wearing?: Pull over shirt    Upper body assist Assist Level: Supervision/Verbal cueing    Lower Body Dressing/Undressing Lower body dressing      What is the patient wearing?: Pants     Lower body assist Assist for lower body dressing: Minimal Assistance - Patient > 75%     Toileting Toileting    Toileting assist Assist for toileting: Minimal Assistance - Patient > 75%     Transfers Chair/bed transfer  Transfers assist  Chair/bed transfer assist level: Moderate Assistance - Patient 50 - 74% (stand<>pivot)     Locomotion Ambulation   Ambulation assist   Ambulation activity did not occur: Safety/medical concerns (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)          Walk 10 feet activity   Assist  Walk 10 feet activity did not occur: Safety/medical concerns (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)        Walk 50 feet activity   Assist Walk 50 feet with 2 turns activity did not occur: Safety/medical concerns (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)         Walk 150 feet activity   Assist Walk 150 feet activity did not occur: Safety/medical concerns (bilateral  BKA with improper fitting L prosthetic)         Walk 10 feet on uneven surface  activity   Assist Walk 10 feet on uneven surfaces activity did not occur: Safety/medical concerns (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)         Wheelchair     Assist Is the patient using a wheelchair?: Yes Type of Wheelchair: Manual Wheelchair activity did not occur: Safety/medical concerns  Wheelchair assist level: Supervision/Verbal cueing Max wheelchair distance: 197ft    Wheelchair 50 feet with 2 turns activity    Assist    Wheelchair 50 feet with 2 turns activity did not occur: Safety/medical concerns   Assist Level: Supervision/Verbal cueing   Wheelchair 150 feet activity     Assist  Wheelchair 150 feet activity did not occur: Safety/medical concerns   Assist Level: Supervision/Verbal cueing   Blood pressure (!) 149/59, pulse 83, temperature 97.7 F (36.5 C), resp. rate 15, height 4\' 8"  (1.422 m), weight 67.5 kg, SpO2 100%.  Medical Problem List and Plan: 1. 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  Impaired mobility/chronic DVT: continue Lovenox             -antiplatelet therapy: Aspirin and Plavix for three weeks followed by aspirin alone.  Stop date for Plavix 10/10   3. Postop and phantom limb pain RLE: pt with severe pain this afternoon -Tylenol, oxycodone as needed -add gabapentin for phantom limb pain, 100mg  TID   4. Mood/Behavior/Sleep: LCSW to evaluate and provide emotional support             -antipsychotic agents: n/a             -continue trazodone 50 mg prn sleep   5. Neuropsych/cognition: This patient is capable of making decisions on his own behalf.   6. Skin/Wound Care:                - black shrinker sock              -f/u with Dr. Lajoyce Corners as outpt   7. Fluids/Electrolytes/Nutrition: Routine Is and Os and follow-up chemistries             -continue zinc, Vit D, C supplements             -carb modified diet  8: Hypertension: monitor TID and prn             -continue amlodipine 5 mg daily             -decrease losartan to 25mg  daily   10/6 fair to reasonable control at present 9: Hyperlipidemia: continue statin     11: DM: A1c = 11.1%; CBGs QID             -continue SSI             -increase semglee to 10U  -advised to avoid added sugar  CBG (last 3)  Recent Labs    07/03/23 1640 07/03/23 2251 07/04/23 0619  GLUCAP 154* 206* 193*   10/6  increase semglee to 14u daily. 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 Hypotension: decrease Cozaar to 25mg  daily. Continue this dose. Decrease Cozaar to 5mg  daily.   16.  Suboptimal vitamin D: increase D3 to 3,000U daily  17. Hyperkalemia: lokelma x1 administered 10/2  18. Hypoalbuminemia: educated regarding eating enough protein from whole food sources.    -pt's appetite is fine. Was eating a huge omelet this am.     LOS: 5 days A FACE TO FACE EVALUATION WAS PERFORMED  Ranelle Oyster 07/04/2023, 9:14 AM

## 2023-07-05 LAB — BASIC METABOLIC PANEL
Anion gap: 11 (ref 5–15)
BUN: 26 mg/dL — ABNORMAL HIGH (ref 8–23)
CO2: 23 mmol/L (ref 22–32)
Calcium: 9.6 mg/dL (ref 8.9–10.3)
Chloride: 103 mmol/L (ref 98–111)
Creatinine, Ser: 1.36 mg/dL — ABNORMAL HIGH (ref 0.61–1.24)
GFR, Estimated: 56 mL/min — ABNORMAL LOW (ref >60)
Glucose, Bld: 101 mg/dL — ABNORMAL HIGH (ref 70–99)
Potassium: 4.7 mmol/L (ref 3.5–5.1)
Sodium: 137 mmol/L (ref 135–145)

## 2023-07-05 LAB — CBC
HCT: 28.7 % — ABNORMAL LOW (ref 39.0–52.0)
Hemoglobin: 9.1 g/dL — ABNORMAL LOW (ref 13.0–17.0)
MCH: 25.8 pg — ABNORMAL LOW (ref 26.0–34.0)
MCHC: 31.7 g/dL (ref 30.0–36.0)
MCV: 81.3 fL (ref 80.0–100.0)
Platelets: 274 10*3/uL (ref 150–400)
RBC: 3.53 MIL/uL — ABNORMAL LOW (ref 4.22–5.81)
RDW: 14.8 % (ref 11.5–15.5)
WBC: 7.4 10*3/uL (ref 4.0–10.5)
nRBC: 0 % (ref 0.0–0.2)

## 2023-07-05 LAB — GLUCOSE, CAPILLARY
Glucose-Capillary: 101 mg/dL — ABNORMAL HIGH (ref 70–99)
Glucose-Capillary: 150 mg/dL — ABNORMAL HIGH (ref 70–99)
Glucose-Capillary: 120 mg/dL — ABNORMAL HIGH (ref 70–99)
Glucose-Capillary: 114 mg/dL — ABNORMAL HIGH (ref 70–99)

## 2023-07-05 MED ORDER — VITAMIN D 25 MCG (1000 UNIT) PO TABS
3000.0000 [IU] | ORAL_TABLET | Freq: Every day | ORAL | Status: DC
  Administered 2023-07-06: 3000 [IU] via ORAL
  Filled 2023-07-05: qty 3

## 2023-07-05 MED ORDER — AMLODIPINE BESYLATE 2.5 MG PO TABS
2.5000 mg | ORAL_TABLET | Freq: Every day | ORAL | Status: DC
  Administered 2023-07-06 – 2023-07-07 (×2): 2.5 mg via ORAL
  Filled 2023-07-05 (×2): qty 1

## 2023-07-05 NOTE — Progress Notes (Signed)
Occupational Therapy Note  Patient Details  Name: [name]. MRN: [id number] Date of Birth: 09-18-1951   A bariatric drop arm BSC is recommended by OT for pt, as pt will be non-ambulatory/standing at DC with primary use of slideboard/squat pivot/anterior and posterior transfers for toileting needs. 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 In addition, a bariatric drop arm BSC would greatly increase pt independence, decrease burden of care, and decrease fall risk at DC.    Azaleah Usman E Sahiba Granholm, MS, OTR/L  07/05/2023, 8:56 AM

## 2023-07-05 NOTE — Progress Notes (Signed)
Physical Therapy Session Note  Patient Details  Name: [name]. MRN: [id number] Date of Birth: October 25, 1950  Today's Date: 07/05/2023 PT Individual Time: 0930-1026 PT Individual Time Calculation (min): 56 min   Short Term Goals: Week 1:  PT Short Term Goal 1 (Week 1): STG=LTG due to LOS  Skilled Therapeutic Interventions/Progress Updates:   Received pt sitting in WC, pt agreeable to PT treatment, and denied any pain during session. Session with emphasis on functional mobility/transfers, generalized strengthening and endurance, dynamic standing balance/coordination, and simulated car transfers. Pt performed WC mobility >376ft using BUE and supervision to ortho gym - emphasis on UE strength.   Pt performed simulated car transfer via slideboard with CGA and assist to place/remove board with cues for technique. Pt required assist to doff/don legrests and armrests and for WC set up due to blindness in L eye. Pt then transferred to/from mat via lateral scoot with CGA. Stood from 21in high mat with RW and mod A x 2 trials and performed R hip flexion x10 and R hip abduction x10 to fatigue. Stood x 2 additional reps from EOM with RW and mod A and worked on Librarian, academic horseshoes with LUE x 2 trials with mod A for balance. Pt with difficulty letting go with L hand and demonstrating flexed trunk and difficulty extending L knee. Pt required cues to keep RUE on RW and push up with LUE from mat. Pt then performed shoulder extensions into physioball 1x5 with 10 second isometric hold with emphasis on core strength - limited by reports of "lightheadedness" that resolved with rest. Pt declined practicing stand<>pivot back to WC due to fatigue. Transported back to room in [facility] dependently and requested to return to bed. Transferred WC<>bed via lateral scoot with CGA, removed prosthetic with total A, removed liners without assist, and transferred into sidelying with supervision. Concluded session  with pt sidelying in bed asleep, needs within reach, and bed alarm on. RN present at bedside administering medications.   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

## 2023-07-05 NOTE — Progress Notes (Signed)
Patient ID: [name]., male   DOB: 01/05/1951, 72 y.o.   MRN: [id number]  [facility] orders sent to [facility].

## 2023-07-05 NOTE — Progress Notes (Signed)
Speech Language Pathology Daily Session Note  Patient Details  Name: [name]. MRN: [id number] Date of Birth: Feb 25, 1951  Today's Date: 07/05/2023 SLP Individual Time: 1400-1455 SLP Individual Time Calculation (min): 55 min  Short Term Goals: Week 1: SLP Short Term Goal 1 (Week 1): STGs=LTGs d/t ELOS  Skilled Therapeutic Interventions: SLP conducted skilled therapy session targeting cognitive retraining goals. Patient greeted with nursing staff in room assisting patient with transfer to bed and doffing of prosthetic leg. Patient sequenced doffing of prosthetic appropriately. SLP and patient discussed patient and his wife's work history with patient sequencing story events adequately for cohesive communication. SLP facilitated problem solving and memory tasks with patient requiring supervision-minA overall for basic problem solving tasks and supervisionA overall for memory tasks. Patient required minA-modA to recall purposes of current medications overall. During temporal problem solving tasks, patient benefited from repetition of prompt during more complex presentations. Patient was left in lowered bed with call bell in reach and bed alarm set. SLP will continue to target goals per plan of care.       Pain Pain Assessment Pain Scale: 0-10 Pain Score: 0-No pain  Therapy/Group: Individual Therapy  Jeannie Done, M.A., CCC-SLP  [name] 07/05/2023, 3:42 PM

## 2023-07-05 NOTE — Progress Notes (Signed)
Patient ID: [name]., male   DOB: 29-Jun-1951, 72 y.o.   MRN: [id number]  Transfer board ordered through [employer].

## 2023-07-05 NOTE — Progress Notes (Signed)
Occupational Therapy Session Note  Patient Details  Name: Kit Deluca. MRN: [id number] Date of Birth: [birth year]  Today's Date: 07/05/2023  OT Individual Time: 5188-4166 & 0630-1601 OT Individual Time Calculation (min): 41 min & 40 min   Short Term Goals: Week 1:  OT Short Term Goal 1 (Week 1): STG = LTG 2/2 ELOS  Skilled Therapeutic Interventions/Progress Updates:  Session 1 Skilled OT intervention completed with focus on ADL retraining and functional transfers. Pt received upright in bed, agreeable to session. No pain reported.  Pt reports "I had to go to the bathroom really bad so I took myself to the bathroom over the weekend." Discussed slideboard, however pt stated "I'm not gonna need that at home." Had pt demo ability to transfer as desired in prep for upcoming DC to observe safety and allow opportunity for education.   W/c positioned for pt next to EOB. Pt was set up A for donning prosthesis to LLE. With prosthesis on, pt was able to squat pivot with close supervision, and w/c stability provided. Self-propelled in w/c > bari DABSC over toilet with min A only over bathroom threshold. Cues needed step by step for sequencing doffing arm rest, locking brakes and dropping BSC arm rest but able to squat pivot with grab bar with CGA. Supervision for all 3 toileting steps for continent void only, using lateral leans for LB clothing management. CGA needed for similar transfer back to w/c with again sequencing cues for safety.  Seated at sink, pt washed/dressed UB with set up A. Able to laterally lean to doff underwear and wash periareas with supervision. Pt with poor awareness about need of doffing shrinker for hygiene and switching to clean one, as pt stated "it's been several days but it's alright." Had pt doff shrinker with supervision. Education provided on [employer] and routine. Min A needed to donn new 3X shrinker. Pt able to bump up with BUE on arm rests with supervision for  min A to donn pants over hips. Pt initially declining limb guard, but receptive with re-education about hospital policy and safety for mobility for limb protection.  Pt remained seated in w/c, with chair alarm on/activated, and with all needs in reach at end of session.  Session 2 Skilled OT intervention completed with focus on functional transfers, DC planning, and DME education. Pt received upright in bed, agreeable to session. No pain reported.  Pt threaded pants in bed with set up A, then EOB with supervision using lateral leans for over hips. Donned LLE prosthesis independently. Supervision lateral scoot > w/c. Transported dependently in w/c > ADL bathroom.  Education provided on DME available such as tub bench that can be purchased for use at home, suggested use of [facility] for ease of bathing, shower curtain management to prevent water spillage, minimizing stands via lateral leans for pericare, use of grab bars/installation as well as effective safety strategies for exiting the shower to eliminate falls. Pt reports having shower chair, but discussed this is not recommend due to current status of bilateral amputee with only 1 prosthesis. Pt was able to demo CGA level lateral scoot from w/c <> TTB with cues for body positioning and hand placement. CSW notified of TTB need.  Pt self-propelled in w/c with supervision > 300 ft back to room. In earlier session pt reported not needing bari DABSC for toilet transfers therefore had pt demo transfer on regular toilet. Pt needed max cues to not use grab bar as pt reports not having it  at home, but able to laterally scoot > toilet with CGA. CGA needed for lateral leans vs supervision when on BSC. Continent of urinary void only, however significant difficulty redonning pants as compared to on bari [facility] Min A needed to donn over hips, with several trials while pt used side of locked w/c and grab bar (that he doesn't have at home) to bump up for assist. Min squat  pivot with grab bar to w/c.  Pt remained seated in w/c, declining wear of limb guard, with chair alarm on/activated, and with all needs in reach at end of session.   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

## 2023-07-05 NOTE — Progress Notes (Signed)
PROGRESS NOTE   Subjective/Complaints: Appreciate SW ordering transfer board Patient's chart reviewed- No issues reported overnight Vitals signs stable    ROS: Patient denies fever, rash, sore throat, blurred vision, dizziness, nausea, vomiting, diarrhea, cough, shortness of breath or chest pain, joint or back/neck pain, headache, or mood change.    Objective:   No results found. Recent Labs    07/05/23 0641  WBC 7.4  HGB 9.1*  HCT 28.7*  PLT 274    Recent Labs    07/05/23 0641  NA 137  K 4.7  CL 103  CO2 23  GLUCOSE 101*  BUN 26*  CREATININE 1.36*  CALCIUM 9.6     Intake/Output Summary (Last 24 hours) at 07/05/2023 1045 Last data filed at 07/05/2023 0213 Gross per 24 hour  Intake 477 ml  Output 925 ml  Net -448 ml        Physical Exam: Vital Signs Blood pressure 115/76, pulse 82, temperature 97.7 F (36.5 C), resp. rate 18, height 4\' 8"  (1.422 m), weight 67.5 kg, SpO2 100%. Gen: no distress, normal appearing HEENT: oral mucosa pink and moist, NCAT Cardio: Reg rate Chest: normal effort, normal rate of breathing Abd: soft, non-distended Ext: no edema Psych: flat affect  Musculoskeletal:     Cervical back: Normal range of motion.     Comments: Right BK stump with shrinker in place Skin:    Comments: Left residual limb intact and appropriately shaped  Neurological:     Mental Status: He is alert.     Comments: Alert and oriented x 3. Normal insight and awareness. Intact Memory. Normal language and speech. Cranial nerve exam unremarkable although pt is blind in his left eye. MMT: RUE 5/5, 4+ to 5/5 LUE. RLE limited by pain. LLE 4 to 4+/5 HF, KE. Sensory exam normal for light touch and pain in all 4 limbs. No limb ataxia or cerebellar signs. No abnormal tone appreciated. Neurological exam stable today 07/05/2023       Assessment/Plan: 1. Functional deficits which require 3+ hours per day of  interdisciplinary therapy in a comprehensive inpatient rehab setting. Physiatrist is providing close team supervision and 24 hour management of active medical problems listed below. Physiatrist and rehab team continue to assess barriers to discharge/monitor patient progress toward functional and medical goals  Care Tool:  Bathing    Body parts bathed by patient: Right arm, Left arm, Chest, Abdomen, Front perineal area, Buttocks, Right upper leg, Left upper leg, Face     Body parts n/a: Right lower leg, Left lower leg   Bathing assist Assist Level: Supervision/Verbal cueing     Upper Body Dressing/Undressing Upper body dressing   What is the patient wearing?: Pull over shirt    Upper body assist Assist Level: Set up assist    Lower Body Dressing/Undressing Lower body dressing      What is the patient wearing?: Pants     Lower body assist Assist for lower body dressing: Minimal Assistance - Patient > 75%     Toileting Toileting    Toileting assist Assist for toileting: Supervision/Verbal cueing     Transfers Chair/bed transfer  Transfers assist     Chair/bed  transfer assist level: Contact Guard/Touching assist (lateral scoot)     Locomotion Ambulation   Ambulation assist   Ambulation activity did not occur: Safety/medical concerns (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)          Walk 10 feet activity   Assist  Walk 10 feet activity did not occur: Safety/medical concerns (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)        Walk 50 feet activity   Assist Walk 50 feet with 2 turns activity did not occur: Safety/medical concerns (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)         Walk 150 feet activity   Assist Walk 150 feet activity did not occur: Safety/medical concerns (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)         Walk 10 feet on uneven surface  activity   Assist Walk 10 feet on uneven surfaces  activity did not occur: Safety/medical concerns (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)         Wheelchair     Assist Is the patient using a wheelchair?: Yes Type of Wheelchair: Manual Wheelchair activity did not occur: Safety/medical concerns  Wheelchair assist level: Supervision/Verbal cueing Max wheelchair distance: >341ft    Wheelchair 50 feet with 2 turns activity    Assist    Wheelchair 50 feet with 2 turns activity did not occur: Safety/medical concerns   Assist Level: Supervision/Verbal cueing   Wheelchair 150 feet activity     Assist  Wheelchair 150 feet activity did not occur: Safety/medical concerns   Assist Level: Supervision/Verbal cueing   Blood pressure 115/76, pulse 82, temperature 97.7 F (36.5 C), resp. rate 18, height 4\' 8"  (1.422 m), weight 67.5 kg, SpO2 100%.  Medical Problem List and Plan: 1. 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  Impaired mobility/chronic DVT: continue Lovenox             -antiplatelet therapy: Aspirin and Plavix for three weeks followed by aspirin alone.  Stop date for Plavix 10/10   3. Postop and phantom limb pain RLE: pt with severe pain this afternoon -Tylenol, oxycodone as needed -add gabapentin for phantom limb pain, 100mg  TID   4. Mood/Behavior/Sleep: LCSW to evaluate and provide emotional support             -antipsychotic agents: n/a             -continue trazodone 50 mg prn sleep   5. Neuropsych/cognition: This patient is capable of making decisions on his own behalf.   6. Skin/Wound Care:               - black shrinker sock              -f/u with Dr. Lajoyce Corners as outpt   7. Fluids/Electrolytes/Nutrition: Routine Is and Os and follow-up  chemistries             -continue zinc, Vit D, C supplements             -carb modified diet   8: Hypertension: monitor TID and  prn             -continue amlodipine 5 mg daily             -decrease losartan to 25mg  daily   10/6 fair to reasonable control at present 9: Hyperlipidemia: continue statin     11: DM: A1c = 11.1%; CBGs QID             -continue SSI  -advised to avoid added sugar  CBG (last 3)  Recent Labs    07/04/23 1657 07/04/23 2028 07/05/23 0627  GLUCAP 166* 101* 101*   Continue Semglee 14U 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 Hypotension: decrease Cozaar to 25mg  daily. Continue this dose. Decrease amlodipine to 2.5mg  daily.   16.  Suboptimal vitamin D: increase D3 to 3,000U daily  17. Hyperkalemia: lokelma x1 administered 10/2, potassium reviewed and is 4.7 on 10/7  18. Hypoalbuminemia: educated regarding eating enough protein from whole food sources.     LOS: 6 days A FACE TO FACE EVALUATION WAS PERFORMED  Conchita Truxillo P Francenia Chimenti 07/05/2023, 10:45 AM

## 2023-07-06 LAB — BASIC METABOLIC PANEL
Anion gap: 10 (ref 5–15)
BUN: 25 mg/dL — ABNORMAL HIGH (ref 8–23)
CO2: 23 mmol/L (ref 22–32)
Calcium: 9.6 mg/dL (ref 8.9–10.3)
Chloride: 103 mmol/L (ref 98–111)
Creatinine, Ser: 1.27 mg/dL — ABNORMAL HIGH (ref 0.61–1.24)
GFR, Estimated: >60 mL/min (ref >60)
Glucose, Bld: 90 mg/dL (ref 70–99)
Potassium: 4.9 mmol/L (ref 3.5–5.1)
Sodium: 136 mmol/L (ref 135–145)

## 2023-07-06 LAB — GLUCOSE, CAPILLARY
Glucose-Capillary: 104 mg/dL — ABNORMAL HIGH (ref 70–99)
Glucose-Capillary: 125 mg/dL — ABNORMAL HIGH (ref 70–99)
Glucose-Capillary: 103 mg/dL — ABNORMAL HIGH (ref 70–99)

## 2023-07-06 MED ORDER — GABAPENTIN 100 MG PO CAPS: 100.0000 mg | ORAL_CAPSULE | Freq: Every day | ORAL | Status: DC

## 2023-07-06 MED ORDER — GABAPENTIN 100 MG PO CAPS
200.0000 mg | ORAL_CAPSULE | Freq: Every day | ORAL | Status: DC
  Administered 2023-07-06 – 2023-07-07 (×2): 200 mg via ORAL
  Filled 2023-07-06 (×2): qty 2

## 2023-07-06 MED ORDER — VITAMIN D 25 MCG (1000 UNIT) PO TABS
4000.0000 [IU] | ORAL_TABLET | Freq: Every day | ORAL | Status: DC
  Administered 2023-07-07 – 2023-07-08 (×2): 4000 [IU] via ORAL
  Filled 2023-07-06 (×2): qty 4

## 2023-07-06 MED ORDER — INSULIN GLARGINE-YFGN 100 UNIT/ML ~~LOC~~ SOLN
15.0000 [IU] | Freq: Every day | SUBCUTANEOUS | Status: DC
  Administered 2023-07-07: 15 [IU] via SUBCUTANEOUS
  Filled 2023-07-06: qty 0.15

## 2023-07-06 MED ORDER — GABAPENTIN 100 MG PO CAPS
100.0000 mg | ORAL_CAPSULE | Freq: Two times a day (BID) | ORAL | Status: DC
  Administered 2023-07-06 – 2023-07-08 (×4): 100 mg via ORAL
  Filled 2023-07-06 (×4): qty 1

## 2023-07-06 NOTE — Progress Notes (Signed)
Speech Language Pathology Daily Session Note  Patient Details  Name: [name]. MRN: [id number] Date of Birth: May 18, 1951  Today's Date: 07/06/2023 SLP Individual Time: 4098-1191 SLP Individual Time Calculation (min): 40 min  Short Term Goals: Week 1: SLP Short Term Goal 1 (Week 1): STGs=LTGs d/t ELOS  Skilled Therapeutic Interventions: SLP conducted skilled therapy session targeting cognitive retraining goals. SLP and patient discussed pending discharge and anticipated cognitive difficulties, with patient reporting that he feels he will have no trouble. Patient reports that care partner has not yet brought glasses to the hospital, thus limited to verbally presented problems vs. Visual tasks. SLP guided patient through verbal temporal problem solving tasks with patient solving 100% of prompts accurately when given repetition of prompt intermittently. SLP and patient discussed plans for final session taking place tomorrow with hopes that patient will have glasses present to participate in visual problem solving tasks. Patient was left in chair with call bell in reach and chair alarm set. SLP will continue to target goals per plan of care.       Pain Pain Assessment Pain Scale: 0-10 Pain Score: 0-No pain  Therapy/Group: Individual Therapy  Jeannie Done, M.A., CCC-SLP  [name] 07/06/2023, 3:58 PM

## 2023-07-06 NOTE — Progress Notes (Signed)
PROGRESS NOTE   Subjective/Complaints: No new complaints this morning Therapy limited today due to fatigue and pain, will add additional gabapentin 100mg  HS    ROS: Patient denies fever, rash, sore throat, blurred vision, dizziness, nausea, vomiting, diarrhea, cough, shortness of breath or chest pain, joint or back/neck pain, headache, or mood change.    Objective:   No results found. Recent Labs    07/05/23 0641  WBC 7.4  HGB 9.1*  HCT 28.7*  PLT 274    Recent Labs    07/05/23 0641 07/06/23 0539  NA 137 136  K 4.7 4.9  CL 103 103  CO2 23 23  GLUCOSE 101* 90  BUN 26* 25*  CREATININE 1.36* 1.27*  CALCIUM 9.6 9.6     Intake/Output Summary (Last 24 hours) at 07/06/2023 1334 Last data filed at 07/06/2023 1327 Gross per 24 hour  Intake 717 ml  Output 1150 ml  Net -433 ml        Physical Exam: Vital Signs Blood pressure (!) 149/46, pulse 84, temperature 98.7 F (37.1 C), resp. rate 16, height 4\' 8"  (1.422 m), weight 67.5 kg, SpO2 99%. Gen: no distress, normal appearing HEENT: oral mucosa pink and moist, NCAT Cardio: Reg rate Chest: normal effort, normal rate of breathing Abd: soft, non-distended Ext: no edema Psych: pleasant, normal affect  Psych: flat affect  Musculoskeletal:     Cervical back: Normal range of motion.     Comments: Right BK stump with shrinker in place Skin:    Comments: Left residual limb intact and appropriately shaped  Neurological:     Mental Status: He is alert.     Comments: Alert and oriented x 3. Normal insight and awareness. Intact Memory. Normal language and speech. Cranial nerve exam unremarkable although pt is blind in his left eye. MMT: RUE 5/5, 4+ to 5/5 LUE. RLE limited by pain. LLE 4 to 4+/5 HF, KE. Sensory exam normal for light touch and pain in all 4 limbs. No limb ataxia or cerebellar signs. No abnormal tone appreciated. Neurological exam stable today 07/06/2023        Assessment/Plan: 1. Functional deficits which require 3+ hours per day of interdisciplinary therapy in a comprehensive inpatient rehab setting. Physiatrist is providing close team supervision and 24 hour management of active medical problems listed below. Physiatrist and rehab team continue to assess barriers to discharge/monitor patient progress toward functional and medical goals  Care Tool:  Bathing    Body parts bathed by patient: Right arm, Left arm, Chest, Abdomen, Front perineal area, Buttocks, Right upper leg, Left upper leg, Face     Body parts n/a: Right lower leg, Left lower leg   Bathing assist Assist Level: Set up assist     Upper Body Dressing/Undressing Upper body dressing   What is the patient wearing?: Pull over shirt    Upper body assist Assist Level: Set up assist    Lower Body Dressing/Undressing Lower body dressing      What is the patient wearing?: Pants     Lower body assist Assist for lower body dressing: Minimal Assistance - Patient > 75%     Editor, commissioning  assist Assist for toileting: Supervision/Verbal cueing     Transfers Chair/bed transfer  Transfers assist     Chair/bed transfer assist level: Contact Guard/Touching assist (lateral scoot)     Locomotion Ambulation   Ambulation assist   Ambulation activity did not occur: Safety/medical concerns (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)          Walk 10 feet activity   Assist  Walk 10 feet activity did not occur: Safety/medical concerns (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)        Walk 50 feet activity   Assist Walk 50 feet with 2 turns activity did not occur: Safety/medical concerns (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)         Walk 150 feet activity   Assist Walk 150 feet activity did not occur: Safety/medical concerns (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)         Walk 10 feet on  uneven surface  activity   Assist Walk 10 feet on uneven surfaces activity did not occur: Safety/medical concerns (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)         Wheelchair     Assist Is the patient using a wheelchair?: Yes Type of Wheelchair: Manual Wheelchair activity did not occur: Safety/medical concerns  Wheelchair assist level: Supervision/Verbal cueing Max wheelchair distance: >37ft    Wheelchair 50 feet with 2 turns activity    Assist    Wheelchair 50 feet with 2 turns activity did not occur: Safety/medical concerns   Assist Level: Supervision/Verbal cueing   Wheelchair 150 feet activity     Assist  Wheelchair 150 feet activity did not occur: Safety/medical concerns   Assist Level: Supervision/Verbal cueing   Blood pressure (!) 149/46, pulse 84, temperature 98.7 F (37.1 C), resp. rate 16, height 4\' 8"  (1.422 m), weight 67.5 kg, SpO2 99%.  Medical Problem List and Plan: 1. 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  Impaired mobility/chronic DVT: continue Lovenox             -antiplatelet therapy: Aspirin and Plavix for three weeks followed by aspirin alone.  Stop date for Plavix 10/10   3. Postop and phantom limb pain RLE: pt with severe pain this afternoon -Tylenol, oxycodone as needed -add gabapentin for phantom limb pain, 100mg  TID, add additional 10mg  HS   4. Mood/Behavior/Sleep: LCSW to evaluate and provide emotional support             -antipsychotic agents: n/a             -continue trazodone 50 mg prn sleep   5. Neuropsych/cognition: This patient is capable of making decisions on his own behalf.   6. Skin/Wound Care:               - black shrinker sock               -f/u with Dr. Lajoyce Corners as outpt   7. Fluids/Electrolytes/Nutrition: Routine Is and Os and follow-up chemistries             -continue  zinc, Vit D, C supplements             -carb modified diet   8: Hypertension: monitor TID and prn             -continue amlodipine 5 mg daily             -decrease losartan to 25mg  daily   10/6 fair to reasonable control at present 9: Hyperlipidemia: continue statin     11: DM: A1c = 11.1%; CBGs QID             D/c ISS  -advised to avoid added sugar  CBG (last 3)  Recent Labs    07/05/23 1656 07/05/23 2144 07/06/23 1210  GLUCAP 120* 114* 104*   Increase semglee to 15U 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 Hypotension: decrease Cozaar to 25mg  daily. Continue this dose. Decrease amlodipine to 2.5mg  daily.   16.  Suboptimal vitamin D: increase D3 to 4,000U daily  17. Hyperkalemia: lokelma x1 administered 10/2, potassium reviewed and is 4.7 on 10/7  18. Hypoalbuminemia: educated regarding eating enough protein from whole food sources.   19 Fatigue: add additional 100mg  gabapentin HS to help him to sleep better at night.     LOS: 7 days A FACE TO FACE EVALUATION WAS PERFORMED  [name] P Deicy Rusk 07/06/2023, 1:34 PM

## 2023-07-06 NOTE — Progress Notes (Signed)
Physical Therapy Session Note  Patient Details  Name: [name]. MRN: [id number] Date of Birth: 07/12/1951  Today's Date: 07/06/2023 PT Individual Time: 0830-0910 PT Individual Time Calculation (min): 40 min Today's Date: 07/06/2023 PT Missed Time: 20 Minutes Missed Time Reason: Pain;Patient fatigue  Short Term Goals: Week 1:  PT Short Term Goal 1 (Week 1): STG=LTG due to LOS  Skilled Therapeutic Interventions/Progress Updates:   Received pt semi-reclined in bed asleep. Pt required significantly increased time to arouse, but then kept eyes closed throughout entire session and reported "not feeling well". Pt reported 10/10 pain in R residual limb and feeling like there was "poor circulation" in his legs - RN notified and suggested repositioning and changing shrinkers.  Removed dirty 3XL shrinker and donned 2XL shrinker with total A - pt reported relief. Therapist washed dirty shrinker while RN administered medications. Pt transferred semi-reclined<>sitting R EOB with HOB elevated and mod I with increased time. Donned gel liners independently and prosthetic with set up assist. Pt transferred bed<>recliner via squat<>pivot/lateral scoot with CGA. Pt washed face with set up assist and requested to shower - notified primary OT. Pt ultimately too fatigued to continue any further with session. Concluded session with pt sitting in recliner, needs within reach, and chair pad alarm on. 20 minutes missed of skilled physical therapy due to fatigue and pain. Will attempt to make up missed time as able.   Therapy Documentation Precautions:  Precautions Precautions: Fall Precaution Comments: wound vac, legally blind in Lt eye, fistua RUE Required Braces or Orthoses: Other Brace Other Brace: RLE limb gaurd, LLE prosthesis (improper fitting) Restrictions Weight Bearing Restrictions: Yes RLE Weight Bearing: Non weight bearing LLE Weight Bearing: Weight bearing as tolerated   Therapy/Group:  Individual Therapy [name] [name] PT, DPT 07/06/2023, 6:54 AM

## 2023-07-06 NOTE — Progress Notes (Signed)
Occupational Therapy Session Note  Patient Details  Name: [name]. MRN: [id number] Date of Birth: Dec 20, 1950  Today's Date: 07/06/2023 OT Individual Time: 1100-1155 & 7829-5621 OT Individual Time Calculation (min): 53 min & 43 min OT missed time: 7 min Missed time reason: [employer] confirming home details for DME needs   Short Term Goals: Week 1:  OT Short Term Goal 1 (Week 1): STG = LTG 2/2 ELOS  Skilled Therapeutic Interventions/Progress Updates:  Session 1 Skilled OT intervention completed with focus on ADL retraining, functional endurance, and mobility within a shower context. Pt received seated in recliner, agreeable to session. 7/10 pain reported in Rt residual limb; pre-medicated. OT offered rest breaks, repositioning and moist heat via shower for pain relief.  Pt completed all lateral scoot transfers with CGA/supervision but min/mod A for slide board transfer from w/c <> TTB. Min cues needed for low vision strategies, and for fall prevention.  Lateral scoot from recliner > w/c, transported dependently in w/c > shower, then slide board transfer to TTB.   Waterproof cover applied to Rt residual limb. Pt was able to bathe all parts with set up A, at the seated level while using lateral leans for periareas. Did drop a couple items. Slideboard transfer using grab bar > w/c with fatigue onset therefore increased difficulty; discussed using board after showers as prosthesis on LLE remains off until limb dry. Able to donn shirt/deo with set up A.   [employer] present to clarify home details for DME needs. Pt missed 7 mins of OT intervention secondary to consult with [employer]; OT will make up missed time as able. Direct care handoff to NT due to OT time constraint. All needs at end of session.  Session 2 Skilled OT intervention completed with focus on w/c management education and w/c mobility on unlevel surfaces. Pt received upright in bed, agreeable to session. No pain reported.  New  manual 16x16 w/c present. Pt agreeable to get OOB to try w/c and go outside to boost moral, as pt expressing feeling fatigued and down this afternoon.   Transitioned to EOB independently, and donned LLE prosthesis in same manner. Education provided about w/c features including flip back arm rest, with pt able to manage with supervision/cues for low vision strategies. Supervision squat pivot > w/c. Discussed how brake extenders can challenge transfers especially slide board, so recommended doffing for those transfers. Education provided on BLE positioning, with towel wrapped with coban for proper knee extension of RLE.  Pt self-propelled in w/c 100 ft x2 with supervision needed for navigating around items on Lt side due to baseline blindness. Pt reported hands having a slick feeling on the w/c rims, therefore discussed getting gloves that can increase friction for ease with propulsion.   Transported dependently in w/c rest of way to hospital courtyard. Able to self-propel on pavement including slight decline and incline about 75 ft with supervision needed for controlling speed and safety awareness. Discussed community-level mobility. Transported dependently in w/c back to room, then pt remained seated in w/c, with chair alarm on/activated, and with all needs in reach at end of session.   Therapy Documentation Precautions:  Precautions Precautions: Fall Precaution Comments: wound vac, legally blind in Lt eye, fistua RUE Required Braces or Orthoses: Other Brace Other Brace: RLE limb gaurd, LLE prosthesis (improper fitting) Restrictions Weight Bearing Restrictions: Yes RLE Weight Bearing: Non weight bearing LLE Weight Bearing: Weight bearing as tolerated    Therapy/Group: Individual Therapy  Elysse Polidore Karl Pock, MS, OTR/L  07/06/2023, 3:34 PM

## 2023-07-07 LAB — GLUCOSE, CAPILLARY
Glucose-Capillary: 75 mg/dL (ref 70–99)
Glucose-Capillary: 85 mg/dL (ref 70–99)
Glucose-Capillary: 51 mg/dL — ABNORMAL LOW (ref 70–99)
Glucose-Capillary: 79 mg/dL (ref 70–99)
Glucose-Capillary: 80 mg/dL (ref 70–99)

## 2023-07-07 MED ORDER — INSULIN GLARGINE-YFGN 100 UNIT/ML ~~LOC~~ SOLN
14.0000 [IU] | Freq: Every day | SUBCUTANEOUS | Status: DC
  Administered 2023-07-08: 14 [IU] via SUBCUTANEOUS
  Filled 2023-07-07: qty 0.14

## 2023-07-07 NOTE — Progress Notes (Signed)
Patient ID: [name]., male   DOB: July 12, 1951, 72 y.o.   MRN: [id number]  Team Conference Report to Patient/Family  Team Conference discussion was reviewed with the patient and caregiver, including goals, any changes in plan of care and target discharge date.  Patient and caregiver express understanding and are in agreement.  The patient has a target discharge date of 07/08/23.  SW met with patient and provided team conference updates. Sw informed pt that he [place] on target for d/c tomorrow.  No additional questions or concerns.  [name] 07/07/2023, 2:30 PM

## 2023-07-07 NOTE — Patient Care Conference (Signed)
Inpatient RehabilitationTeam Conference and Plan of Care Update Date: 07/07/2023   Time: 11:39 AM    Patient Name: [name].      Medical Record Number: [id number]  Date of Birth: [birth year] Sex: Male         Room/Bed: 4W14C/4W14C-01 Payor Info: Payor: [employer] MEDICARE / Plan: [employer] MEDICARE HMO / Product Type: *No Product type* /    Admit Date/Time:  06/29/2023  2:22 PM  Primary Diagnosis:  CVA (cerebral vascular accident) [facility])  Hospital Problems: Principal Problem:   CVA (cerebral vascular accident) [facility])    Expected Discharge Date: Expected Discharge Date: 07/08/23  Team Members Present: Physician leading conference: Dr. Sula Soda Social Worker Present: Lavera Guise, BSW Nurse Present: Chana Bode, RN PT Present: Raechel Chute, PT OT Present: Candee Furbish, OT SLP Present: Jeannie Done, SLP PPS Coordinator present : Fae Pippin, SLP     Current Status/Progress Goal Weekly Team Focus  Bowel/Bladder   patient is continent of b/b   maintain continence   offer toileting qshift and PRN    Swallow/Nutrition/ Hydration   regular/thin liquid diet, independent           ADL's   Supervision UB, Min A LB and toileting, CGA lateral scoot toilet transfers   Supervision   Barriers- limb care awareness, endurance, seated level care without assist, family education not intitiated    Mobility   bed mobility supervision, lateral scoot/slideboard transfers CGA with assist to place/remove board, sit<>stands and stand<>pivots with RW and mod A, WC mobility 368ft supervision   supervision  barriers: global weakness/deconditioning, intermittent pain, DME, family education    Communication   low vocal intensity   minA   use of speech intelligibility increasing strategies    Safety/Cognition/ Behavioral Observations  overall minA for basic tasks   minA   mildly complex tasks for problem solving, sustained attention    Pain   patient does c/o pain in  RLE usually 7/10 on pain scale Oxy 5mg  is effective   <3/10 on pain scale   assess pain qshift and PRN    Skin   Right BKA staples and shrinker in place wound   BKA to remain infection free  assess BKA qshift and PRN for s/s of infection      Discharge Planning:  D/c on Thursday with spouse. No barriers.   Team Discussion: Patient post CVA with soft voice and cognition at baseline.   Patient on target to meet rehab goals: yes, currently needs supervision for upper body care and min assist for lower body care. Independent with bed mobility but needs supervision for transfers. Completes sit - stand and stand pivot transfers with mod assist and able to ambulate using a RW up to 150'. Goals for discharge set for supervision overall.  *See Care Plan and progress notes for long and short-term goals.   Revisions to Treatment Plan:  Shrinker for residual limb   Teaching Needs: Safety, residual limb care/awareness, skin care, medications, transfers, etc.   Current Barriers to Discharge: None  Possible Resolutions to Barriers: Family education HH follow up services DME: Bariatric drop-arm BSC, TTB, slide-board, W/C, RW     Medical Summary Current Status: type 2 diabetes insulin dependent, hypotension, AKI, pain  Barriers to Discharge: Medical stability  Barriers to Discharge Comments: type 2 diabetes insulin dependent, hypotension, AKI, pain Possible Resolutions to Barriers/Weekly Focus: decrease semglee to 14 U, discontinue amlodipine, decreased Cozaar, creatrinine reviewed and improved, added gabapentin at night   Continued  Need for Acute Rehabilitation Level of Care: The patient requires daily medical management by a physician with specialized training in physical medicine and rehabilitation for the following reasons: Direction of a multidisciplinary physical rehabilitation program to maximize functional independence : Yes Medical management of patient stability for increased  activity during participation in an intensive rehabilitation regime.: Yes Analysis of laboratory values and/or radiology reports with any subsequent need for medication adjustment and/or medical intervention. : Yes   I attest that I was present, lead the team conference, and concur with the assessment and plan of the team.   Chana Bode B 07/07/2023, 10:43 PM

## 2023-07-07 NOTE — Progress Notes (Signed)
Physical Therapy Session Note  Patient Details  Name: [name]. MRN: [id number] Date of Birth: 1951/05/20  Today's Date: 07/07/2023 PT Individual Time: 0930-1025 and 1300-1355 PT Individual Time Calculation (min): 55 min and 55 min  Short Term Goals: Week 1:  PT Short Term Goal 1 (Week 1): STG=LTG due to LOS  Skilled Therapeutic Interventions/Progress Updates:   Treatment Session 1 Received pt sitting in WC shaving with wife, [name], present for family education training. Pt agreeable to PT treatment and denied any pain during session. Session with emphasis on discharge planning, functional mobility/transfers, generalized strengthening and endurance, and simulated car transfers. Pt performed WC mobility >365ft using BUE and supervision/mod I to ortho gym with emphasis on UE strength. Pt performed simulated car transfer with slideboard and close supervision with assist to place and remove board. Educated pt/wife on ensuring L foot on ground prior to exiting car - pt able to set up transfer independently with increased time. Pt's wife reports they have 4 6in steps to enter apartment but plan to stay at her mom's apartment with level entry until ramp is built. Pt transported back to room in [facility] dependently and went through sensation, MMT, and pain interference questionnaire in preparation for discharge. Pt requested to return to bed and transferred WC<>bed via lateral scoot with supervision provided by [name]. [name] assisted with remvoing prosthatic and pt transferred into supine independently. Concluded session with pt semi-reclined in bed, needs within reach, and bed alarm on.   Educated [place] on the following: -recommendation to use slideboard as primary transfer method -to avoid standing unless with therapist  -donning limb guard -shrinker wear/care schedule -bathroom equipment ordered per OT request -WC parts management (donning/doffing legrests and armrests and how to fold up  WC)  Treatment Session 2 Received pt semi-reclined in bed, pt agreeable to PT treatment, and reported pain 8/10 in R residual limb but declined any pain medications. Session with emphasis on functional mobility/transfers, generalized strengthening and endurance, and dynamic standing balance/coordination. Pt transferred semi-reclined<>sitting EOB with HOB elevated and mod I and donned R limb guard with max A and gel liners independently, then requested to put on sweat pants. Donned sweats and prosthetic with supervision while seated, then stood from EOB with RW and mod A and required total A to pull pants over hips. Pt transferred bed<>WC via squat<>pivot with close supervision/CGA.    Pt required increased time with mobility, telling therapist stories from when he was in the [employer]. Pt performed WC mobility 167ft using BUE and mod I to dayroom with emphasis on UE strength. Pt then performed the following exercises with emphasis on UE strength/ROM: -bicep curls with 4lb dowel 2x10 bilaterally  -overhead chest press<>horizontal chest press 2x10 Returned to room and requested to return to bed. Pt transferred WC<>bed via lateral scoot with supervision, removed prosthetic with total A, and transferred sit<>sidelying independently. Concluded session with pt sidelying in bed, needs within reach, and bed alarm on awaiting upcoming SLP session.   Therapy Documentation Precautions:  Precautions Precautions: Fall Precaution Comments: wound vac, legally blind in Lt eye, fistua RUE Required Braces or Orthoses: Other Brace Other Brace: RLE limb gaurd, LLE prosthesis (improper fitting) Restrictions Weight Bearing Restrictions: Yes RLE Weight Bearing: Non weight bearing LLE Weight Bearing: Weight bearing as tolerated  Therapy/Group: Individual Therapy [name] [name] PT, DPT 07/07/2023, 6:52 AM

## 2023-07-07 NOTE — Progress Notes (Signed)
Occupational Therapy Discharge Summary  Patient Details  Name: [name]. MRN: [id number] Date of Birth: 03/09/1951  Date of Discharge from OT service:July 07, 2023   Patient has met 7 of 9 long term goals due to improved activity tolerance, improved balance, ability to compensate for deficits, improved awareness, and improved coordination.  Patient to discharge at overall Supervision level.  Patient's care partner is independent to provide the necessary physical and cognitive assistance at discharge. Pt's wife did not attend education with OT, however present for PT therefore did receive education on pt's CLOF and recommendations for functional mobility and DME needs.  Reasons goals not met: Pt did not meet sit > stand and dynamic standing balance goal at min A level due to generalized weakness and balance deficits requiring mod A to complete.  Recommendation:  Patient will benefit from ongoing skilled OT services in home health setting to continue to advance functional skills in the area of BADL, iADL, and Reduce care partner burden.  Equipment: Bariatric drop arm BSC and TTB  Reasons for discharge: treatment goals met  Patient/family agrees with progress made and goals achieved: Yes  OT Discharge Precautions/Restrictions  Precautions Precautions: Fall Precaution Comments: legally blind in L eye, fistua RUE Required Braces or Orthoses: Other Brace Other Brace: RLE limb gaurd, LLE prosthesis Restrictions Weight Bearing Restrictions: Yes RLE Weight Bearing: Non weight bearing LLE Weight Bearing: Weight bearing as tolerated ADL ADL Eating: Independent Where Assessed-Eating: Wheelchair Grooming: Modified independent Where Assessed-Grooming: Sitting at sink Upper Body Bathing: Modified independent Where Assessed-Upper Body Bathing: Shower Lower Body Bathing: Setup Where Assessed-Lower Body Bathing: Shower Upper Body Dressing: Setup Where Assessed-Upper Body  Dressing: Sitting at sink Lower Body Dressing: Supervision/safety Where Assessed-Lower Body Dressing: Edge of bed, Other (Comment) ([facility]) Toileting: Supervision/safety Where Assessed-Toileting: Bedside Commode Toilet Transfer: Close supervision Toilet Transfer Method: Other (comment) (lateral scoot) Acupuncturist: Extra wide drop arm bedside commode, Grab bars Tub/Shower Transfer: Close supervison Web designer Method: Conservation officer, historic buildings: Insurance underwriter: Close supervision Film/video editor Method: Other (comment) (lateral scoot) Astronomer: Grab bars Vision Baseline Vision/History: 2 Legally blind;1 Wears glasses (hx of L eye blindness) Patient Visual Report: No change from baseline Vision Assessment?: No apparent visual deficits Additional Comments: Lt sided blindness at baseline Perception  Perception: Impaired Perception-Other Comments: impaired due to low vision Praxis Praxis: WFL Cognition Cognition Overall Cognitive Status: Within Functional Limits for tasks assessed Arousal/Alertness: Awake/alert Orientation Level: Person;Place;Situation Person: Oriented Place: Oriented Situation: Oriented Memory: Appears intact Awareness: Appears intact Problem Solving: Appears intact Safety/Judgment: Impaired Brief Interview for Mental Status (BIMS) Repetition of Three Words (First Attempt): 3 Temporal Orientation: Year: Correct Temporal Orientation: Month: Accurate within 5 days Temporal Orientation: Day: Correct Recall: "Sock": Yes, no cue required Recall: "Blue": Yes, no cue required Recall: "Bed": Yes, no cue required BIMS Summary Score: 15 Sensation Sensation Light Touch: Appears Intact Hot/Cold: Not tested Proprioception: Impaired by gross assessment Stereognosis: Not tested Coordination Gross Motor Movements are Fluid and Coordinated: No Fine Motor Movements are Fluid and  Coordinated: No Coordination and Movement Description: 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 Finger Nose Finger Test: dysmetria bilaterally LUE>RUE Heel Shin Test: unable to perform due to bilateral BKA Motor  Motor Motor: Abnormal postural alignment and control Motor - Skilled Clinical Observations: 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 Mobility  Bed Mobility Bed Mobility:  Rolling Right;Rolling Left;Sit to Supine;Supine to Sit Rolling Right: Independent Rolling Left: Independent Supine to Sit: Independent Sit to Supine: Independent Transfers Sit to Stand: Moderate Assistance - Patient 50-74% Stand to Sit: Minimal Assistance - Patient > 75%  Trunk/Postural Assessment  Cervical Assessment Cervical Assessment: Within Functional Limits Thoracic Assessment Thoracic Assessment: Exceptions to [facility] (mild kyphosis) Lumbar Assessment Lumbar Assessment: Exceptions to [facility] (posterior pelvic tilt in sitting) Postural Control Postural Control: Deficits on evaluation Righting Reactions: delayed on R  Balance Balance Balance Assessed: Yes Static Sitting Balance Static Sitting - Balance Support: Feet supported;Bilateral upper extremity supported Static Sitting - Level of Assistance: 7: Independent Dynamic Sitting Balance Dynamic Sitting - Balance Support: Feet supported;No upper extremity supported Dynamic Sitting - Level of Assistance: 6: Modified independent (Device/Increase time) Static Standing Balance Static Standing - Balance Support: Bilateral upper extremity supported;During functional activity (RW) Static Standing - Level of Assistance: 4: Min assist Dynamic Standing Balance Dynamic Standing - Balance Support: Bilateral upper extremity supported;During functional activity (RW) Dynamic Standing - Level of Assistance: 3: Mod assist Dynamic Standing - Comments: transfers only Extremity/Trunk Assessment RUE  Assessment RUE Assessment: Within Functional Limits LUE Assessment LUE Assessment: Within Functional Limits   Milbern Doescher E Shawntee Mainwaring, MS, OTR/L  07/07/2023, 12:08 PM

## 2023-07-07 NOTE — Progress Notes (Signed)
Occupational Therapy Session Note  Patient Details  Name: [name]. MRN: [id number] Date of Birth: 1950/12/10  Today's Date: 07/07/2023 OT Individual Time: 8295-6213 OT Individual Time Calculation (min): 40 min    Short Term Goals: Week 1:  OT Short Term Goal 1 (Week 1): STG = LTG 2/2 ELOS  Skilled Therapeutic Interventions/Progress Updates:  Skilled OT intervention completed with focus on ADL retraining and activity tolerance. Pt received semi supine in bed, asleep, requiring increased time to arouse then agreeable to session. 7/10 pain reported in Rt residual limb; nurse in room at start of session to administer meds. OT offered rest breaks and repositioning throughout for pain reduction.  Discussed DC plans as wife has not been present to observe pt's CLOF and for education and pt has full supervision goals. Pt called wife to request her attendance to later sessions for opportunity with education prior to DC. Team notified.  Pt very slow going this AM, requiring increased time to complete tasks. Doffed LB clothing with mod I at bed level, then donned new ones with supervision at bed level and EOB. Donned LLE sleeve/prosthesis independently. Lateral scoot with supervision however needed assist to position w/c and manage all steps for transfer.   Doffed RLE black shrinker and washed by hand at sink with set up A, with cues needed to do so for hygiene despite repeated education. Mod A needed to donn Pensions consultant (designed for BKAs), 24" with pt tolerating well. Incision intact, without drainage and noted to be healing well.  Pt remained seated in w/c set up for oral care at sink, with chair alarm on/activated, and with all needs in reach at end of session.   Therapy Documentation Precautions:  Precautions Precautions: Fall Precaution Comments: legally blind in L eye, fistua RUE Required Braces or Orthoses: Other Brace Other Brace: RLE limb gaurd, LLE  prosthesis Restrictions Weight Bearing Restrictions: Yes RLE Weight Bearing: Non weight bearing LLE Weight Bearing: Weight bearing as tolerated    Therapy/Group: Individual Therapy  Melvyn Novas, MS, OTR/L  07/07/2023, 10:52 AM

## 2023-07-07 NOTE — Progress Notes (Signed)
Inpatient Rehabilitation Care Coordinator Discharge Note   Patient Details  Name: [name]. MRN: [id number] Date of Birth: 1951/01/13   Discharge location: Home  Length of Stay: 9 Days  Discharge activity level: Sup/Min  Home/community participation: Spouse  Patient response [id number] Literacy - How often do you need to have someone help you when you read instructions, pamphlets, or other written material from your doctor or pharmacy?: Sometimes  Patient response [id number] Isolation - How often do you feel lonely or isolated from those around you?: Rarely  Services provided included: SW, Pharmacy, CM, TR, RN, OT, PT, RD, MD  Financial Services:  Financial Services Utilized: Private Insurance [employer]  Choices offered to/list presented to: Patient  Follow-up services arranged:  Home Health, DME Home Health Agency: [facility] Furbish PT OT    DME : Wheelchair, Tub transfer bench, slide board and barid drop arm.    Patient response to transportation need: Is the patient able to respond to transportation needs?: Yes In the past 12 months, has lack of transportation kept you from medical appointments or from getting medications?: No In the past 12 months, has lack of transportation kept you from meetings, work, or from getting things needed for daily living?: No   Patient/Family verbalized understanding of follow-up arrangements:  Yes  Individual responsible for coordination of the follow-up plan: self  Confirmed correct DME delivered: [name] 07/07/2023    Comments (or additional information):  Summary of Stay    Date/Time Discharge Planning CSW  07/06/23 1240 D/c on Thursday with spouse. No barriers. CJB       [name]

## 2023-07-07 NOTE — Plan of Care (Signed)
  Problem: RH Balance Goal: LTG Patient will maintain dynamic standing with ADLs (OT) Description: LTG:  Patient will maintain dynamic standing balance with assist during activities of daily living (OT)  Outcome: Not Met (add Reason) Flowsheets (Taken 07/07/2023 1208) LTG: Pt will maintain dynamic standing balance during ADLs with: (not met due to generalized weakness and balance deficits requiring increased assist) --   Problem: Sit to Stand Goal: LTG:  Patient will perform sit to stand in prep for activites of daily living with assistance level (OT) Description: LTG:  Patient will perform sit to stand in prep for activites of daily living with assistance level (OT) Outcome: Not Met (add Reason) Flowsheets (Taken 07/07/2023 1208) LTG: PT will perform sit to stand in prep for activites of daily living with assistance level: (not met due to generalized weakness and balance deficits requiring increased assist) --   Problem: RH Balance Goal: LTG: Patient will maintain dynamic sitting balance (OT) Description: LTG:  Patient will maintain dynamic sitting balance with assistance during activities of daily living (OT) Outcome: Completed/Met   Problem: RH Bathing Goal: LTG Patient will bathe all body parts with assist levels (OT) Description: LTG: Patient will bathe all body parts with assist levels (OT) Outcome: Completed/Met   Problem: RH Dressing Goal: LTG Patient will perform upper body dressing (OT) Description: LTG Patient will perform upper body dressing with assist, with/without cues (OT). Outcome: Completed/Met Goal: LTG Patient will perform lower body dressing w/assist (OT) Description: LTG: Patient will perform lower body dressing with assist, with/without cues in positioning using equipment (OT) Outcome: Completed/Met   Problem: RH Toileting Goal: LTG Patient will perform toileting task (3/3 steps) with assistance level (OT) Description: LTG: Patient will perform toileting task  (3/3 steps) with assistance level (OT)  Outcome: Completed/Met   Problem: RH Toilet Transfers Goal: LTG Patient will perform toilet transfers w/assist (OT) Description: LTG: Patient will perform toilet transfers with assist, with/without cues using equipment (OT) Outcome: Completed/Met   Problem: RH Tub/Shower Transfers Goal: LTG Patient will perform tub/shower transfers w/assist (OT) Description: LTG: Patient will perform tub/shower transfers with assist, with/without cues using equipment (OT) Outcome: Completed/Met

## 2023-07-07 NOTE — Progress Notes (Signed)
Speech Language Pathology Discharge Summary  Patient Details  Name: [name]. MRN: [id number] Date of Birth: [birth year]  Date of Discharge from SLP service:July 07, 2023  Today's Date: 07/07/2023 SLP Individual Time: 4098-1191 SLP Individual Time Calculation (min): 41 min  Skilled Therapeutic Interventions:  SLP conducted skilled therapy session targeting cognitive retraining goals. SLP administered SLUMS examination to determine any lingering cognitive deficits prior to discharge with patient exhibiting deficits in the domains of working memory and delayed recall. Wife not present for session, thus no family education completed prior to discharge. Given patient's performance on SLUMS examination, reviewed memory strategies with patient and provided handout to promote use post-discharge. Patient provided examples of how to use each strategy given minA. SLP and patient also discussed events from the morning with PT/OT with patient recalling accurately with supervisionA. Patient was left in lowered bed with call bell in reach and bed alarm set. SLP will continue to target goals per plan of care.     Patient has met 5 of 5 long term goals.  Patient to discharge at overall Supervision level.  Reasons goals not met: n/a   Clinical Impression/Discharge Summary: Patient has made excellent progress towards therapy goals this reporting period meeting 5/5 long term goals set for duration of admission. Patient currently presents with speech characterized by low vocal intensity and imprecise articulation at times, but benefits from min verbal cues as needed to increase volume and intelligibility to 90-100% at the conversation level. Patient is at an overall supervision level for basic cognitive tasks with need for cuing increasing to min as task complexity increases. Patient reports he feels he is at baseline level of function but therapy tasks have been limited throughout therapy course as patient's  glasses have not been present, thus visual tasks have not been appropriate. Patient would benefit from speech therapy services at next level of care to continue to target any lingering cognitive deficits. Patient and family education complete. SLP will sign off.   Care Partner:  Caregiver Able to Provide Assistance: Yes  Type of Caregiver Assistance: Cognitive  Recommendation:  24 hour supervision/assistance;Home Health SLP  Rationale for SLP Follow Up: Maximize cognitive function and independence   Equipment: n/a   Reasons for discharge: Discharged from hospital   Patient/Family Agrees with Progress Made and Goals Achieved: Yes   Jeannie Done, M.A., CCC-SLP   [name] 07/07/2023, 2:43 PM

## 2023-07-07 NOTE — Progress Notes (Signed)
PROGRESS NOTE   Subjective/Complaints: No new complaints this morning  Ready for d/c tomorrow At supervision level  ROS: Patient denies fever, rash, sore throat, blurred vision, dizziness, nausea, vomiting, diarrhea, cough, shortness of breath or chest pain, joint or back/neck pain, headache, or mood change.    Objective:   No results found. Recent Labs    07/05/23 0641  WBC 7.4  HGB 9.1*  HCT 28.7*  PLT 274    Recent Labs    07/05/23 0641 07/06/23 0539  NA 137 136  K 4.7 4.9  CL 103 103  CO2 23 23  GLUCOSE 101* 90  BUN 26* 25*  CREATININE 1.36* 1.27*  CALCIUM 9.6 9.6     Intake/Output Summary (Last 24 hours) at 07/07/2023 1045 Last data filed at 07/07/2023 0928 Gross per 24 hour  Intake 600 ml  Output 250 ml  Net 350 ml        Physical Exam: Vital Signs Blood pressure (!) 128/47, pulse 87, temperature 98.6 F (37 C), resp. rate 16, height 4\' 8"  (1.422 m), weight 67.5 kg, SpO2 100%. Gen: no distress, normal appearing HEENT: oral mucosa pink and moist, NCAT Cardio: Reg rate Chest: normal effort, normal rate of breathing Abd: soft, non-distended Ext: no edema Psych: flat affect  Musculoskeletal:     Cervical back: Normal range of motion.     Comments: Right BK stump with shrinker in place Skin:    Comments: Left residual limb intact and appropriately shaped  Neurological:     Mental Status: He is alert.     Comments: Alert and oriented x 3. Normal insight and awareness. Intact Memory. Normal language and speech. Cranial nerve exam unremarkable although pt is blind in his left eye. MMT: RUE 5/5, 4+ to 5/5 LUE. RLE limited by pain. LLE 4 to 4+/5 HF, KE. Sensory exam normal for light touch and pain in all 4 limbs. No limb ataxia or cerebellar signs. No abnormal tone appreciated. Neurological exam stable today 07/07/2023       Assessment/Plan: 1. Functional deficits which require 3+ hours per day of  interdisciplinary therapy in a comprehensive inpatient rehab setting. Physiatrist is providing close team supervision and 24 hour management of active medical problems listed below. Physiatrist and rehab team continue to assess barriers to discharge/monitor patient progress toward functional and medical goals  Care Tool:  Bathing    Body parts bathed by patient: Right arm, Left arm, Chest, Abdomen, Front perineal area, Buttocks, Right upper leg, Left upper leg, Face     Body parts n/a: Right lower leg, Left lower leg   Bathing assist Assist Level: Set up assist     Upper Body Dressing/Undressing Upper body dressing   What is the patient wearing?: Pull over shirt    Upper body assist Assist Level: Set up assist    Lower Body Dressing/Undressing Lower body dressing      What is the patient wearing?: Pants     Lower body assist Assist for lower body dressing: Minimal Assistance - Patient > 75%     Toileting Toileting    Toileting assist Assist for toileting: Supervision/Verbal cueing     Transfers Chair/bed transfer  Transfers assist     Chair/bed transfer assist level: Supervision/Verbal cueing (slideboard)     Locomotion Ambulation   Ambulation assist   Ambulation activity did not occur: Safety/medical concerns (decreased balance, weakness, fatigue)          Walk 10 feet activity   Assist  Walk 10 feet activity did not occur: Safety/medical concerns (decreased balance, weakness, fatigue)        Walk 50 feet activity   Assist Walk 50 feet with 2 turns activity did not occur: Safety/medical concerns (decreased balance, weakness, fatigue)         Walk 150 feet activity   Assist Walk 150 feet activity did not occur: Safety/medical concerns (decreased balance, weakness, fatigue)         Walk 10 feet on uneven surface  activity   Assist Walk 10 feet on uneven surfaces activity did not occur: Safety/medical concerns (decreased balance,  weakness, fatigue)         Wheelchair     Assist Is the patient using a wheelchair?: Yes Type of Wheelchair: Manual Wheelchair activity did not occur: Safety/medical concerns  Wheelchair assist level: Independent Max wheelchair distance: 134ft    Wheelchair 50 feet with 2 turns activity    Assist    Wheelchair 50 feet with 2 turns activity did not occur: Safety/medical concerns   Assist Level: Independent   Wheelchair 150 feet activity     Assist  Wheelchair 150 feet activity did not occur: Safety/medical concerns   Assist Level: Independent   Blood pressure (!) 128/47, pulse 87, temperature 98.6 F (37 C), resp. rate 16, height 4\' 8"  (1.422 m), weight 67.5 kg, SpO2 100%.  Medical Problem List and Plan: 1. 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  Impaired mobility/chronic DVT: continue Lovenox             -antiplatelet therapy: Aspirin and Plavix for three weeks followed by aspirin alone.  Stop date for Plavix 10/10   3. Postop and phantom limb pain RLE: pt with severe pain this afternoon -Tylenol, oxycodone as needed -add gabapentin for phantom limb pain, 100mg  TID, add additional 10mg  HS   4. Mood/Behavior/Sleep: LCSW to evaluate and provide emotional support             -antipsychotic agents: n/a             -continue trazodone 50 mg prn sleep   5. Neuropsych/cognition: This patient is capable of making decisions on his own behalf.   6. Skin/Wound Care:               - black shrinker sock              -f/u with Dr. Lajoyce Corners as outpt   7. 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 Hypotension: decrease Cozaar to 25mg  daily. Continue this dose. D/c amlodipine   16.  Suboptimal vitamin D: increase D3 to 4,000U daily  17. Hyperkalemia: lokelma x1 administered 10/2, potassium reviewed and is 4.7 on 10/7  18. Hypoalbuminemia: educated regarding eating enough protein from whole food sources.   19 Fatigue: add additional 100mg  gabapentin HS to help him to sleep better at night.     LOS: 8 days A FACE TO FACE EVALUATION WAS PERFORMED  Iretta Mangrum P Marwan Lipe 07/07/2023, 10:45 AM

## 2023-07-07 NOTE — Progress Notes (Signed)
Physical Therapy Discharge Summary  Patient Details  Name: [name]. MRN: [id number] Date of Birth: 1951/08/05  Date of Discharge from PT service:July 07, 2023  Patient has met 6 of 8 long term goals due to improved activity tolerance, improved balance, improved postural control, increased strength, increased range of motion, decreased pain, ability to compensate for deficits, improved awareness, and improved coordination. Patient to discharge at a wheelchair level Supervision. Patient's care partner is independent to provide the necessary physical assistance at discharge. Pt's wife attended family education training on 10/9 and verbalized and demonstrated confidence with tasks to ensure safe discharge home.   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   Recommendation:  Patient will benefit from ongoing skilled PT services in home health setting to continue to advance safe functional mobility, address ongoing impairments in transfers, generalized strengthening and endurance, dynamic standing balance/coordination, limb loss education, and to minimize fall risk.  Equipment: 16x16 manual WC with bilateral elevating legrests, slideboard - will order RW privately   Reasons for discharge: treatment goals met and discharge from hospital  Patient/family agrees with progress made and goals achieved: Yes  PT Discharge Precautions/Restrictions Precautions Precautions: Fall Precaution Comments: legally blind in L eye, fistua RUE Required Braces or Orthoses: Other Brace Other Brace: RLE limb gaurd, LLE prosthesis Restrictions Weight Bearing Restrictions: Yes RLE Weight Bearing: Non weight bearing LLE Weight Bearing: Weight bearing as tolerated Pain Interference Pain Interference Pain  Effect on Sleep: 1. Rarely or not at all Pain Interference with Therapy Activities: 1. Rarely or not at all Pain Interference with Day-to-Day Activities: 1. 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 Finger Nose Finger Test: dysmetria bilaterally LUE>RUE Heel Shin Test: unable to perform due to bilateral BKA Motor  Motor Motor: Abnormal postural alignment and control Motor - Skilled Clinical Observations: 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  Mobility Bed Mobility Bed Mobility: Rolling Right;Rolling Left;Sit to Supine;Supine to Sit Rolling Right: Independent Rolling Left: Independent Supine to Sit: Independent Sit to Supine: Independent Transfers Transfers: Sit to Stand;Stand to [employer];Lateral/Scoot Transfers Sit to Stand: Moderate Assistance - Patient 50-74% Stand to Sit: Minimal Assistance - Patient > 75% Stand Pivot Transfers: Moderate Assistance - Patient 50 - 74% Lateral/Scoot Transfers: Supervision/Verbal cueing Transfer (Assistive device): Rolling walker Locomotion  Gait Ambulation: No Gait Gait: No Stairs / Additional Locomotion Stairs: No Corporate treasurer: Yes Wheelchair Assistance: Independent with Scientist, research (life sciences): Both upper extremities Wheelchair Parts Management:  Needs assistance Distance: 142ft  Trunk/Postural Assessment  Cervical Assessment Cervical Assessment: Within Functional Limits Thoracic Assessment Thoracic Assessment: Exceptions to [facility] (mild kyphosis) Lumbar Assessment Lumbar Assessment: Exceptions to [facility] (posterior pelvic tilt in sitting) Postural Control Postural Control: Deficits on evaluation Righting Reactions: delayed on R  Balance Balance Balance Assessed: Yes Static Sitting  Balance Static Sitting - Balance Support: Feet supported;Bilateral upper extremity supported Static Sitting - Level of Assistance: 7: Independent Dynamic Sitting Balance Dynamic Sitting - Balance Support: Feet supported;No upper extremity supported Dynamic Sitting - Level of Assistance: 6: Modified independent (Device/Increase time) Static Standing Balance Static Standing - Balance Support: Bilateral upper extremity supported;During functional activity (RW) Static Standing - Level of Assistance: 4: Min assist Dynamic Standing Balance Dynamic Standing - Balance Support: Bilateral upper extremity supported;During functional activity (RW) Dynamic Standing - Level of Assistance: 3: Mod assist Dynamic Standing - Comments: transfers only Extremity Assessment  RLE Assessment RLE Assessment: Exceptions to [facility] General Strength Comments: tested sitting in WC RLE Strength Right Hip Flexion: 3+/5 Right Hip ABduction: 3+/5 Right Hip ADduction: 3+/5 Right Knee Flexion: 3+/5 Right Knee Extension: 3+/5 LLE Assessment LLE Assessment: Exceptions to [facility] General Strength Comments: tested sitting in WC with prosthetic on LLE Strength Left Hip Flexion: 3+/5 Left Hip ABduction: 3+/5 Left Hip ADduction: 3+/5 Left Knee Flexion: 4-/5 Left Knee Extension: 4-/5   [name] [name] PT, DPT 07/07/2023, 7:06 AM

## 2023-07-07 NOTE — Plan of Care (Signed)
  Problem: RH Cognition - SLP Goal: RH LTG Patient will demonstrate orientation with cues Description:  LTG:  Patient will demonstrate orientation to person/place/time/situation with cues (SLP)   Outcome: Completed/Met   Problem: RH Expression Communication Goal: LTG Patient will increase speech intelligibility (SLP) Description: LTG: Patient will increase speech intelligibility at word/phrase/conversation level with cues, % of the time (SLP) Outcome: Completed/Met   Problem: RH Problem Solving Goal: LTG Patient will demonstrate problem solving for (SLP) Description: LTG:  Patient will demonstrate problem solving for basic/complex daily situations with cues  (SLP) Outcome: Completed/Met   Problem: RH Memory Goal: LTG Patient will use memory compensatory aids to (SLP) Description: LTG:  Patient will use memory compensatory aids to recall biographical/new, daily complex information with cues (SLP) Outcome: Completed/Met   Problem: RH Attention Goal: LTG Patient will demonstrate this level of attention during functional activites (SLP) Description: LTG:  Patient will will demonstrate this level of attention during functional activites (SLP) Outcome: Completed/Met

## 2023-07-08 ENCOUNTER — Other Ambulatory Visit (HOSPITAL_COMMUNITY): Payer: Self-pay

## 2023-07-08 LAB — GLUCOSE, CAPILLARY: Glucose-Capillary: 91 mg/dL (ref 70–99)

## 2023-07-08 MED ORDER — INSULIN GLARGINE-YFGN 100 UNIT/ML ~~LOC~~ SOLN: 13.0000 [IU] | Freq: Every day | SUBCUTANEOUS | Status: DC

## 2023-07-08 MED ORDER — METOCLOPRAMIDE HCL 5 MG PO TABS
5.0000 mg | ORAL_TABLET | Freq: Three times a day (TID) | ORAL | 0 refills | Status: DC
  Filled 2023-07-08 (×2): qty 21, 7d supply, fill #0

## 2023-07-08 MED ORDER — GABAPENTIN 100 MG PO CAPS
ORAL_CAPSULE | ORAL | 0 refills | Status: DC
  Filled 2023-07-08 (×2): qty 120, 30d supply, fill #0

## 2023-07-08 MED ORDER — OXYCODONE HCL 5 MG PO TABS
5.0000 mg | ORAL_TABLET | ORAL | 0 refills | Status: DC | PRN
  Filled 2023-07-08 – 2023-07-16 (×2): qty 21, 4d supply, fill #0

## 2023-07-08 MED ORDER — PANTOPRAZOLE SODIUM 40 MG PO TBEC
40.0000 mg | DELAYED_RELEASE_TABLET | Freq: Two times a day (BID) | ORAL | 0 refills | Status: DC
  Filled 2023-07-08 (×2): qty 30, 15d supply, fill #0

## 2023-07-08 MED ORDER — LANTUS SOLOSTAR 100 UNIT/ML ~~LOC~~ SOPN: 13.0000 [IU] | PEN_INJECTOR | Freq: Every day | SUBCUTANEOUS | Status: AC

## 2023-07-08 MED ORDER — LOSARTAN POTASSIUM 25 MG PO TABS
12.5000 mg | ORAL_TABLET | Freq: Every day | ORAL | Status: DC
  Filled 2023-07-08: qty 1

## 2023-07-08 MED ORDER — TRAZODONE HCL 50 MG PO TABS
25.0000 mg | ORAL_TABLET | Freq: Every evening | ORAL | 0 refills | Status: DC | PRN
  Filled 2023-07-08 – 2023-07-16 (×2): qty 30, 30d supply, fill #0

## 2023-07-08 MED ORDER — LOSARTAN POTASSIUM 25 MG PO TABS
12.5000 mg | ORAL_TABLET | Freq: Every day | ORAL | 0 refills | Status: DC
  Filled 2023-07-08 – 2023-07-16 (×2): qty 15, 30d supply, fill #0

## 2023-07-08 MED ORDER — ASCORBIC ACID 1000 MG PO TABS
1000.0000 mg | ORAL_TABLET | Freq: Every day | ORAL | 0 refills | Status: DC
  Filled 2023-07-08 (×2): qty 100, 100d supply, fill #0

## 2023-07-08 MED ORDER — VITAMIN D3 25 MCG PO TABS
4000.0000 [IU] | ORAL_TABLET | Freq: Every day | ORAL | 0 refills | Status: DC
  Filled 2023-07-08 (×2): qty 100, 25d supply, fill #0

## 2023-07-08 MED ORDER — ACETAMINOPHEN 325 MG PO TABS: 325.0000 mg | ORAL_TABLET | ORAL | Status: DC | PRN

## 2023-07-08 NOTE — Progress Notes (Signed)
PROGRESS NOTE   Subjective/Complaints: No new complaints this morning Stable for d/c Discussed decreasing his dose of insulin Diastolic BP remains soft- decrease cozaar to 12.5mg    ROS: Patient denies fever, rash, sore throat, blurred vision, dizziness, nausea, vomiting, diarrhea, cough, shortness of breath or chest pain, joint or back/neck pain, headache, or mood change.    Objective:   No results found. No results for input(s): "WBC", "HGB", "HCT", "PLT" in the last 72 hours.   Recent Labs    07/06/23 0539  NA 136  K 4.9  CL 103  CO2 23  GLUCOSE 90  BUN 25*  CREATININE 1.27*  CALCIUM 9.6     Intake/Output Summary (Last 24 hours) at 07/08/2023 0953 Last data filed at 07/07/2023 1950 Gross per 24 hour  Intake 240 ml  Output 400 ml  Net -160 ml        Physical Exam: Vital Signs Blood pressure (!) 138/57, pulse 81, temperature 97.8 F (36.6 C), resp. rate 16, height 4\' 8"  (1.422 m), weight 67.5 kg, SpO2 100%. Gen: no distress, normal appearing HEENT: oral mucosa pink and moist, NCAT Cardio: Reg rate Chest: normal effort, normal rate of breathing Abd: soft, non-distended Ext: no edema Psych: pleasant, normal affect Skin: intact Psych: flat affect  Musculoskeletal:     Cervical back: Normal range of motion.     Comments: Right BK stump with shrinker in place Skin:    Comments: Left residual limb intact and appropriately shaped  Neurological:     Mental Status: He is alert.     Comments: Alert and oriented x 3. Normal insight and awareness. Intact Memory. Normal language and speech. Cranial nerve exam unremarkable although pt is blind in his left eye. MMT: RUE 5/5, 4+ to 5/5 LUE. RLE limited by pain. LLE 4 to 4+/5 HF, KE. Sensory exam normal for light touch and pain in all 4 limbs. No limb ataxia or cerebellar signs. No abnormal tone appreciated. Neurological exam stable today 07/08/2023        Assessment/Plan: 1. Functional deficits which require 3+ hours per day of interdisciplinary therapy in a comprehensive inpatient rehab setting. Physiatrist is providing close team supervision and 24 hour management of active medical problems listed below. Physiatrist and rehab team continue to assess barriers to discharge/monitor patient progress toward functional and medical goals  Care Tool:  Bathing    Body parts bathed by patient: Right arm, Left arm, Chest, Abdomen, Front perineal area, Buttocks, Right upper leg, Left upper leg, Face     Body parts n/a: Right lower leg, Left lower leg   Bathing assist Assist Level: Set up assist     Upper Body Dressing/Undressing Upper body dressing   What is the patient wearing?: Pull over shirt    Upper body assist Assist Level: Set up assist    Lower Body Dressing/Undressing Lower body dressing      What is the patient wearing?: Pants, Underwear/pull up     Lower body assist Assist for lower body dressing: Supervision/Verbal cueing     Toileting Toileting    Toileting assist Assist for toileting: Supervision/Verbal cueing     Transfers Chair/bed transfer  Transfers assist  Chair/bed transfer assist level: Supervision/Verbal cueing (slideboard)     Locomotion Ambulation   Ambulation assist   Ambulation activity did not occur: Safety/medical concerns (decreased balance, weakness, fatigue)          Walk 10 feet activity   Assist  Walk 10 feet activity did not occur: Safety/medical concerns (decreased balance, weakness, fatigue)        Walk 50 feet activity   Assist Walk 50 feet with 2 turns activity did not occur: Safety/medical concerns (decreased balance, weakness, fatigue)         Walk 150 feet activity   Assist Walk 150 feet activity did not occur: Safety/medical concerns (decreased balance, weakness, fatigue)         Walk 10 feet on uneven surface  activity   Assist Walk 10  feet on uneven surfaces activity did not occur: Safety/medical concerns (decreased balance, weakness, fatigue)         Wheelchair     Assist Is the patient using a wheelchair?: Yes Type of Wheelchair: Manual Wheelchair activity did not occur: Safety/medical concerns  Wheelchair assist level: Independent Max wheelchair distance: 147ft    Wheelchair 50 feet with 2 turns activity    Assist    Wheelchair 50 feet with 2 turns activity did not occur: Safety/medical concerns   Assist Level: Independent   Wheelchair 150 feet activity     Assist  Wheelchair 150 feet activity did not occur: Safety/medical concerns   Assist Level: Independent   Blood pressure (!) 138/57, pulse 81, temperature 97.8 F (36.6 C), resp. rate 16, height 4\' 8"  (1.422 m), weight 67.5 kg, SpO2 100%.  Medical Problem List and Plan: 1. 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  Impaired mobility/chronic DVT: continue Lovenox             -antiplatelet therapy: Aspirin and Plavix for three weeks followed by aspirin alone.  Stop date for Plavix 10/10   3. Postop and phantom limb pain RLE: pt with severe pain this afternoon -Tylenol, oxycodone as needed -add gabapentin for phantom limb pain, 100mg  TID, add additional 10mg  HS   4. Insomnia             -antipsychotic agents: n/a             -continue trazodone 50 mg prn sleep   5. Neuropsych/cognition: This patient is capable of making decisions on his own behalf.   6. Skin/Wound Care:               - black shrinker sock              -f/u with Dr. Lajoyce Corners as outpt   7. Fluids/Electrolytes/Nutrition: Routine Is and Os and follow-up  chemistries             -continue zinc, Vit D, C supplements             -carb modified diet   8: Hypertension: monitor TID and prn             -  d/c amlodipine             -decrease losartan to 25mg  daily   10/6 fair to reasonable control at present 9: Hyperlipidemia: continue statin     11: DM: A1c = 11.1%; CBGs QID             D/c ISS  -advised to avoid added sugar  CBG (last 3)  Recent Labs    07/07/23 1723 07/07/23 2054 07/08/23 0611  GLUCAP 79 80 91   Decrease semglee to 13U 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 Hypotension: decrease Cozaar to 12.5mg  daily. Continue this dose. D/c amlodipine   16.  Suboptimal vitamin D: increase D3 to 4,000U daily  17. Hyperkalemia: lokelma x1 administered 10/2, potassium reviewed and is 4.7 on 10/7  18. Hypoalbuminemia: educated regarding eating enough protein from whole food sources.   19 Fatigue: add additional 100mg  gabapentin HS to help him to sleep better at night. Decrease cozaar to 12.5 mg as diastolic hypotension could be contributing to fatigue   >30 minutes spent in discharge of patient including review of medications and follow-up appointments, physical examination, and in answering all patient's questions     LOS: 9 days A FACE TO FACE EVALUATION WAS PERFORMED  Drema Pry Bassem Bernasconi 07/08/2023, 9:53 AM

## 2023-07-08 NOTE — Progress Notes (Addendum)
Inpatient Rehabilitation Discharge Medication Review by a Pharmacist  A complete drug regimen review was completed for this patient to identify any potential clinically significant medication issues.  High Risk Drug Classes Is patient taking? Indication by Medication  Antipsychotic No   Anticoagulant No   Antibiotic No   Opioid Yes PRN Oxycodone - severe pain  Antiplatelet Yes Aspirin 81 mg - CVA prophylaxis  Hypoglycemics/insulin Yes Insulin glargine - DM Type 2  Vasoactive Medication Yes Losartan - hypertension  Chemotherapy No   Other Yes Atorvastatin - hyperlipidemia Metocolopramide - GI motility Gabapentin - neuropathic pain Mycophenolate, tacrolimus- anti-rejection meds - kidney and pancreas Trazodone - sleep Pantoprazole - GERD Vitamin C, Vitamin D - supplements  PRNs: Acetaminophen - mild pain     Type of Medication Issue Identified Description of Issue Recommendation(s)  Drug Interaction(s) (clinically significant)     Duplicate Therapy  Pantoprazole both BID and daily on discharge med list. Per Kela Millin, PA-C, to continue once daily after discharge.  Allergy     No Medication Administration End Date     Incorrect Dose     Additional Drug Therapy Needed     Significant med changes from prior encounter (inform family/care partners about these prior to discharge). Amlodipine discontinued. Losartan dose decreased. Insulin glargine dose adjusted. Prior weekly Vitamin D > daily dosing New gabapentin. Off prior Lisinopril (now losartan), metformin XR (now insulin), furosemide (discontinued 9/25 inpatient), labetaloln (given 9/8-9/13 while inpatient), ferrous sulfate (MWF in the past). Communicate changes with patient/family prior to discharge.  Other  Clopidogrel x 21 days completed 10/8. Fluconazole for oral candidiasis course completed 10/5.      Clinically significant medication issues were identified that warrant physician communication and completion of  prescribed/recommended actions by midnight of the next day:  No  Pharmacist comments:  Time spent performing this drug regimen review (minutes):  [address]   Dennie Fetters, [place] 07/08/2023 8:39 AM

## 2023-07-10 ENCOUNTER — Other Ambulatory Visit (HOSPITAL_COMMUNITY): Payer: Self-pay

## 2023-07-12 ENCOUNTER — Other Ambulatory Visit (HOSPITAL_COMMUNITY): Payer: Self-pay

## 2023-07-15 DIAGNOSIS — E785 Hyperlipidemia, unspecified: Secondary | ICD-10-CM | POA: Diagnosis not present

## 2023-07-15 DIAGNOSIS — G546 Phantom limb syndrome with pain: Secondary | ICD-10-CM | POA: Diagnosis not present

## 2023-07-15 DIAGNOSIS — E1151 Type 2 diabetes mellitus with diabetic peripheral angiopathy without gangrene: Secondary | ICD-10-CM | POA: Diagnosis not present

## 2023-07-15 DIAGNOSIS — R1310 Dysphagia, unspecified: Secondary | ICD-10-CM | POA: Diagnosis not present

## 2023-07-15 DIAGNOSIS — Z89511 Acquired absence of right leg below knee: Secondary | ICD-10-CM | POA: Diagnosis not present

## 2023-07-15 DIAGNOSIS — I10 Essential (primary) hypertension: Secondary | ICD-10-CM | POA: Diagnosis not present

## 2023-07-15 DIAGNOSIS — Z4781 Encounter for orthopedic aftercare following surgical amputation: Secondary | ICD-10-CM | POA: Diagnosis not present

## 2023-07-15 DIAGNOSIS — H548 Legal blindness, as defined in USA: Secondary | ICD-10-CM | POA: Diagnosis not present

## 2023-07-15 DIAGNOSIS — K219 Gastro-esophageal reflux disease without esophagitis: Secondary | ICD-10-CM | POA: Diagnosis not present

## 2023-07-16 ENCOUNTER — Ambulatory Visit (INDEPENDENT_AMBULATORY_CARE_PROVIDER_SITE_OTHER): Payer: Medicare HMO | Admitting: Family

## 2023-07-16 ENCOUNTER — Other Ambulatory Visit (HOSPITAL_COMMUNITY): Payer: Self-pay

## 2023-07-16 DIAGNOSIS — S88111D Complete traumatic amputation at level between knee and ankle, right lower leg, subsequent encounter: Secondary | ICD-10-CM

## 2023-07-16 DIAGNOSIS — Z89511 Acquired absence of right leg below knee: Secondary | ICD-10-CM

## 2023-07-19 DIAGNOSIS — R1310 Dysphagia, unspecified: Secondary | ICD-10-CM | POA: Diagnosis not present

## 2023-07-19 DIAGNOSIS — Z89511 Acquired absence of right leg below knee: Secondary | ICD-10-CM | POA: Diagnosis not present

## 2023-07-19 DIAGNOSIS — H548 Legal blindness, as defined in USA: Secondary | ICD-10-CM | POA: Diagnosis not present

## 2023-07-19 DIAGNOSIS — G546 Phantom limb syndrome with pain: Secondary | ICD-10-CM | POA: Diagnosis not present

## 2023-07-19 DIAGNOSIS — Z4781 Encounter for orthopedic aftercare following surgical amputation: Secondary | ICD-10-CM | POA: Diagnosis not present

## 2023-07-19 DIAGNOSIS — E785 Hyperlipidemia, unspecified: Secondary | ICD-10-CM | POA: Diagnosis not present

## 2023-07-19 DIAGNOSIS — E1151 Type 2 diabetes mellitus with diabetic peripheral angiopathy without gangrene: Secondary | ICD-10-CM | POA: Diagnosis not present

## 2023-07-19 DIAGNOSIS — I10 Essential (primary) hypertension: Secondary | ICD-10-CM | POA: Diagnosis not present

## 2023-07-19 DIAGNOSIS — K219 Gastro-esophageal reflux disease without esophagitis: Secondary | ICD-10-CM | POA: Diagnosis not present

## 2023-07-20 ENCOUNTER — Telehealth: Payer: Self-pay

## 2023-07-20 ENCOUNTER — Encounter: Payer: Self-pay | Admitting: Family

## 2023-07-20 ENCOUNTER — Other Ambulatory Visit: Payer: Self-pay | Admitting: Physical Medicine and Rehabilitation

## 2023-07-20 DIAGNOSIS — E785 Hyperlipidemia, unspecified: Secondary | ICD-10-CM | POA: Diagnosis not present

## 2023-07-20 DIAGNOSIS — R1310 Dysphagia, unspecified: Secondary | ICD-10-CM | POA: Diagnosis not present

## 2023-07-20 DIAGNOSIS — G546 Phantom limb syndrome with pain: Secondary | ICD-10-CM | POA: Diagnosis not present

## 2023-07-20 DIAGNOSIS — H548 Legal blindness, as defined in USA: Secondary | ICD-10-CM | POA: Diagnosis not present

## 2023-07-20 DIAGNOSIS — K219 Gastro-esophageal reflux disease without esophagitis: Secondary | ICD-10-CM | POA: Diagnosis not present

## 2023-07-20 DIAGNOSIS — I10 Essential (primary) hypertension: Secondary | ICD-10-CM | POA: Diagnosis not present

## 2023-07-20 DIAGNOSIS — Z4781 Encounter for orthopedic aftercare following surgical amputation: Secondary | ICD-10-CM | POA: Diagnosis not present

## 2023-07-20 DIAGNOSIS — R5381 Other malaise: Secondary | ICD-10-CM

## 2023-07-20 DIAGNOSIS — E1151 Type 2 diabetes mellitus with diabetic peripheral angiopathy without gangrene: Secondary | ICD-10-CM | POA: Diagnosis not present

## 2023-07-20 DIAGNOSIS — Z89511 Acquired absence of right leg below knee: Secondary | ICD-10-CM | POA: Diagnosis not present

## 2023-07-20 NOTE — Telephone Encounter (Signed)
Patient will need  a "tub transfer bench " for home use. Please send the request to the [place].   Occupational therapy has been okayed for once week for 4 weeks.   Programmer, systems Alexis with [facility] [phone]).

## 2023-07-20 NOTE — Progress Notes (Addendum)
Post-Op Visit Note   Patient: [name].           Date of Birth: 1951-02-20           MRN: [id number] Visit Date: 07/16/2023 PCP: [name], MD  Chief Complaint:  Chief Complaint  Patient presents with   Right Leg - Routine Post Op    06/18/23 right BKA    HPI:  HPI The patient is a 72 year old gentleman seen status post right below-knee amputation he has been wearing his shrinker.  Is status post remote below-knee amputation on the left.  Is currently removed and socket replacement for the left  Patient is a new right transtibial  amputee.  Patient's current comorbidities are not expected to impact the ability to function with the prescribed prosthesis. Patient verbally communicates a strong desire to use a prosthesis. Patient currently requires mobility aids to ambulate without a prosthesis.  Expects not to use mobility aids with a new prosthesis.  Patient is a K2 level ambulator that will use a prosthesis to walk around their home and the community over low level environmental barriers.   Patient is an existing left transtibial  amputee.  Patient's current comorbidities are not expected to impact the ability to function with the prescribed prosthesis. Patient verbally communicates a strong desire to use a prosthesis. Patient currently requires mobility aids to ambulate without a prosthesis.  Expects not to use mobility aids with a new prosthesis.  Patient is a K2 level ambulator that will use a prosthesis to walk around their home and the community over low level environmental barriers.      Ortho Exam On examination of the right residual limb this is well consolidated well-healed staples are in place there is no gaping drainage or sign of infection The left residual limb is well consolidated well-healed there is no impending ulceration  Visit Diagnoses: No diagnosis found.  Plan: Staples harvested today without incident.  He will continue his shrinker  proceed with prosthesis set up.  Follow-Up Instructions: No follow-ups on file.   Imaging: No results found.  Orders:  No orders of the defined types were placed in this encounter.  No orders of the defined types were placed in this encounter.    PMFS History: Patient Active Problem List   Diagnosis Date Noted   CVA (cerebral vascular accident) (HCC) 06/29/2023   Malnutrition of moderate degree 06/23/2023   Constipation 06/23/2023   Dysphagia 06/21/2023   Abnormal barium swallow 06/21/2023   History of esophageal stricture 06/21/2023   Osteomyelitis of great toe of right foot (HCC) 06/07/2023   Sepsis (HCC) 06/06/2023   Gastroparesis 02/23/2021   Pancreas transplant status (HCC) 02/22/2021   Hypertension    COVID-19 virus infection 09/2020   History of left below knee amputation (HCC) 07/22/2020   Type 2 diabetes mellitus with diabetic polyneuropathy, with long-term current use of insulin (HCC) 07/22/2020   History of simultaneous kidney and pancreas transplant (HCC) 07/22/2020   Dyslipidemia 07/22/2020   DKA (diabetic ketoacidosis) (HCC) 01/25/2020   OSA on CPAP 09/13/2018   Intractable nausea and vomiting 09/12/2018   Kidney transplant recipient 09/12/2018   DKA, type 1 (HCC) 01/05/2018   Nausea and vomiting 10/19/2017   Nausea & vomiting 10/18/2017   Hematemesis 07/24/2017   Hypertensive urgency 07/24/2017   Acute GI bleeding 07/24/2017   Status post below-knee amputation of left lower extremity (HCC) 02/22/2015   S/P BKA (below knee amputation) unilateral (HCC) 02/20/2015   Ischemic  ulcer of toe (HCC)    PAD (peripheral artery disease) (HCC)    PVD (peripheral vascular disease) (HCC) 01/17/2015   Cellulitis and abscess of foot 01/13/2015   Cellulitis and abscess of foot excluding toe 01/13/2015   Diabetes mellitus without complication (HCC) 01/13/2015   Leukocytosis 01/13/2015   Diabetes mellitus with peripheral vascular disease (HCC) 12/19/2007   DYSLIPIDEMIA  12/19/2007   ANEMIA 12/19/2007   ANXIETY 12/19/2007   Essential hypertension 12/19/2007   TRANSIENT ISCHEMIC ATTACK 12/19/2007   PERIPHERAL VASCULAR DISEASE 12/19/2007   RENAL FAILURE 12/19/2007   Past Medical History:  Diagnosis Date   Anxiety    COVID-19 virus infection 09/2020   CPAP (continuous positive airway pressure) dependence    Diabetes mellitus without complication (HCC)    GERD (gastroesophageal reflux disease)    Hypertension    Legally blind in left eye, as defined in [place]    PTSD (post-traumatic stress disorder)    PVD (peripheral vascular disease) (HCC) 01/17/2015   Renal disorder    Sleep apnea    Thyroid disease     Family History  Problem Relation Age of Onset   Hypertension Other     Past Surgical History:  Procedure Laterality Date   ABDOMINAL AORTAGRAM N/A 01/21/2015   Procedure: ABDOMINAL Ronny Flurry;  Surgeon: Chuck Hint, MD;  Location: [facility] CATH LAB;  Service: Cardiovascular;  Laterality: N/A;   ABDOMINAL AORTOGRAM W/LOWER EXTREMITY Right 06/15/2023   Procedure: ABDOMINAL AORTOGRAM W/LOWER EXTREMITY;  Surgeon: Nada Libman, MD;  Location: [facility] INVASIVE CV LAB;  Service: Cardiovascular;  Laterality: Right;   AMPUTATION Left 01/23/2015   Procedure: FIRST RAY AMPUTATION/LEFT FOOT;  Surgeon: Nadara Mustard, MD;  Location: [facility] OR;  Service: Orthopedics;  Laterality: Left;   AMPUTATION Left 02/20/2015   Procedure: AMPUTATION BELOW KNEE;  Surgeon: Nadara Mustard, MD;  Location: [facility] OR;  Service: Orthopedics;  Laterality: Left;   AMPUTATION Right 06/18/2023   Procedure: RIGHT BELOW KNEE AMPUTATION;  Surgeon: Nadara Mustard, MD;  [facility];  Service: Orthopedics;  Laterality: Right;   AV FISTULA PLACEMENT     BALLOON DILATION N/A 02/26/2021   Procedure: BALLOON DILATION;  Surgeon: Hilarie Fredrickson, MD;  Location: [facility] ENDOSCOPY;  Service: Endoscopy;  Laterality: N/A;   BIOPSY  02/26/2021   Procedure: BIOPSY;  Surgeon: Hilarie Fredrickson, MD;  Location: [facility] ENDOSCOPY;   Service: Endoscopy;;   COLONOSCOPY     COMBINED KIDNEY-PANCREAS TRANSPLANT Left 2009   ESOPHAGOGASTRODUODENOSCOPY (EGD) WITH PROPOFOL N/A 02/26/2021   Procedure: ESOPHAGOGASTRODUODENOSCOPY (EGD) WITH PROPOFOL;  Surgeon: Hilarie Fredrickson, MD;  Location: [facility] [place] ENDOSCOPY;  Service: Endoscopy;  Laterality: N/A;   INCISION AND DRAINAGE ABSCESS Left 01/13/2015   Procedure: INCISION AND DRAINAGE OF LEFT FOOT FIRST RAY ABCESS;  Surgeon: Kathryne Hitch, MD;  Location: [facility] OR;  Service: Orthopedics;  Laterality: Left;   LOWER EXTREMITY ANGIOGRAM Left 01/21/2015   Procedure: LOWER EXTREMITY ANGIOGRAM;  Surgeon: Chuck Hint, MD;  Location: [facility] CATH LAB;  Service: Cardiovascular;  Laterality: Left;   PERIPHERAL VASCULAR BALLOON ANGIOPLASTY Right 06/15/2023   Procedure: PERIPHERAL VASCULAR BALLOON ANGIOPLASTY;  Surgeon: Nada Libman, MD;  Location: [facility] INVASIVE CV LAB;  Service: Cardiovascular;  Laterality: Right;  right AT   Social History   Occupational History   Not on file  Tobacco Use   Smoking status: Former   Smokeless tobacco: Never   Tobacco comments:    quit i [phone]  Substance and Sexual Activity   Alcohol  use: Yes    Comment: 1 beer rarely    Drug use: Yes    Types: Marijuana    Comment: last smoked in August 2024   Sexual activity: Not on file

## 2023-07-21 NOTE — Telephone Encounter (Signed)
According to the caller his personal insurance will not cover, it as well as the [place].

## 2023-07-22 DIAGNOSIS — K219 Gastro-esophageal reflux disease without esophagitis: Secondary | ICD-10-CM | POA: Diagnosis not present

## 2023-07-22 DIAGNOSIS — H548 Legal blindness, as defined in USA: Secondary | ICD-10-CM | POA: Diagnosis not present

## 2023-07-22 DIAGNOSIS — I10 Essential (primary) hypertension: Secondary | ICD-10-CM | POA: Diagnosis not present

## 2023-07-22 DIAGNOSIS — E1151 Type 2 diabetes mellitus with diabetic peripheral angiopathy without gangrene: Secondary | ICD-10-CM | POA: Diagnosis not present

## 2023-07-22 DIAGNOSIS — E785 Hyperlipidemia, unspecified: Secondary | ICD-10-CM | POA: Diagnosis not present

## 2023-07-22 DIAGNOSIS — Z89511 Acquired absence of right leg below knee: Secondary | ICD-10-CM | POA: Diagnosis not present

## 2023-07-22 DIAGNOSIS — Z4781 Encounter for orthopedic aftercare following surgical amputation: Secondary | ICD-10-CM | POA: Diagnosis not present

## 2023-07-22 DIAGNOSIS — R1310 Dysphagia, unspecified: Secondary | ICD-10-CM | POA: Diagnosis not present

## 2023-07-22 DIAGNOSIS — G546 Phantom limb syndrome with pain: Secondary | ICD-10-CM | POA: Diagnosis not present

## 2023-07-23 NOTE — Telephone Encounter (Signed)
I have called the patient to get fax number of where we need to send it, or we can mail to patient and he can take to the [place].

## 2023-07-27 DIAGNOSIS — K219 Gastro-esophageal reflux disease without esophagitis: Secondary | ICD-10-CM | POA: Diagnosis not present

## 2023-07-27 DIAGNOSIS — G546 Phantom limb syndrome with pain: Secondary | ICD-10-CM | POA: Diagnosis not present

## 2023-07-27 DIAGNOSIS — Z89511 Acquired absence of right leg below knee: Secondary | ICD-10-CM | POA: Diagnosis not present

## 2023-07-27 DIAGNOSIS — Z4781 Encounter for orthopedic aftercare following surgical amputation: Secondary | ICD-10-CM | POA: Diagnosis not present

## 2023-07-27 DIAGNOSIS — E785 Hyperlipidemia, unspecified: Secondary | ICD-10-CM | POA: Diagnosis not present

## 2023-07-27 DIAGNOSIS — H548 Legal blindness, as defined in USA: Secondary | ICD-10-CM | POA: Diagnosis not present

## 2023-07-27 DIAGNOSIS — I10 Essential (primary) hypertension: Secondary | ICD-10-CM | POA: Diagnosis not present

## 2023-07-27 DIAGNOSIS — R1310 Dysphagia, unspecified: Secondary | ICD-10-CM | POA: Diagnosis not present

## 2023-07-27 DIAGNOSIS — E1151 Type 2 diabetes mellitus with diabetic peripheral angiopathy without gangrene: Secondary | ICD-10-CM | POA: Diagnosis not present

## 2023-07-28 ENCOUNTER — Other Ambulatory Visit (HOSPITAL_COMMUNITY): Payer: Self-pay

## 2023-07-28 DIAGNOSIS — E785 Hyperlipidemia, unspecified: Secondary | ICD-10-CM | POA: Diagnosis not present

## 2023-07-28 DIAGNOSIS — Z4781 Encounter for orthopedic aftercare following surgical amputation: Secondary | ICD-10-CM | POA: Diagnosis not present

## 2023-07-28 DIAGNOSIS — Z89511 Acquired absence of right leg below knee: Secondary | ICD-10-CM | POA: Diagnosis not present

## 2023-07-28 DIAGNOSIS — I10 Essential (primary) hypertension: Secondary | ICD-10-CM | POA: Diagnosis not present

## 2023-07-28 DIAGNOSIS — K219 Gastro-esophageal reflux disease without esophagitis: Secondary | ICD-10-CM | POA: Diagnosis not present

## 2023-07-28 DIAGNOSIS — G546 Phantom limb syndrome with pain: Secondary | ICD-10-CM | POA: Diagnosis not present

## 2023-07-28 DIAGNOSIS — E1151 Type 2 diabetes mellitus with diabetic peripheral angiopathy without gangrene: Secondary | ICD-10-CM | POA: Diagnosis not present

## 2023-07-28 DIAGNOSIS — H548 Legal blindness, as defined in USA: Secondary | ICD-10-CM | POA: Diagnosis not present

## 2023-07-28 DIAGNOSIS — R1310 Dysphagia, unspecified: Secondary | ICD-10-CM | POA: Diagnosis not present

## 2023-07-29 DIAGNOSIS — G546 Phantom limb syndrome with pain: Secondary | ICD-10-CM | POA: Diagnosis not present

## 2023-07-29 DIAGNOSIS — H548 Legal blindness, as defined in USA: Secondary | ICD-10-CM | POA: Diagnosis not present

## 2023-07-29 DIAGNOSIS — E785 Hyperlipidemia, unspecified: Secondary | ICD-10-CM | POA: Diagnosis not present

## 2023-07-29 DIAGNOSIS — K219 Gastro-esophageal reflux disease without esophagitis: Secondary | ICD-10-CM | POA: Diagnosis not present

## 2023-07-29 DIAGNOSIS — I10 Essential (primary) hypertension: Secondary | ICD-10-CM | POA: Diagnosis not present

## 2023-07-29 DIAGNOSIS — Z4781 Encounter for orthopedic aftercare following surgical amputation: Secondary | ICD-10-CM | POA: Diagnosis not present

## 2023-07-29 DIAGNOSIS — R1310 Dysphagia, unspecified: Secondary | ICD-10-CM | POA: Diagnosis not present

## 2023-07-29 DIAGNOSIS — Z89511 Acquired absence of right leg below knee: Secondary | ICD-10-CM | POA: Diagnosis not present

## 2023-07-29 DIAGNOSIS — E1151 Type 2 diabetes mellitus with diabetic peripheral angiopathy without gangrene: Secondary | ICD-10-CM | POA: Diagnosis not present

## 2023-07-30 DIAGNOSIS — Z794 Long term (current) use of insulin: Secondary | ICD-10-CM

## 2023-07-30 DIAGNOSIS — E1151 Type 2 diabetes mellitus with diabetic peripheral angiopathy without gangrene: Secondary | ICD-10-CM | POA: Diagnosis not present

## 2023-07-30 DIAGNOSIS — R1310 Dysphagia, unspecified: Secondary | ICD-10-CM | POA: Diagnosis not present

## 2023-07-30 DIAGNOSIS — G546 Phantom limb syndrome with pain: Secondary | ICD-10-CM | POA: Diagnosis not present

## 2023-07-30 DIAGNOSIS — F419 Anxiety disorder, unspecified: Secondary | ICD-10-CM

## 2023-07-30 DIAGNOSIS — F4310 Post-traumatic stress disorder, unspecified: Secondary | ICD-10-CM

## 2023-07-30 DIAGNOSIS — Z4781 Encounter for orthopedic aftercare following surgical amputation: Secondary | ICD-10-CM | POA: Diagnosis not present

## 2023-07-30 DIAGNOSIS — K219 Gastro-esophageal reflux disease without esophagitis: Secondary | ICD-10-CM | POA: Diagnosis not present

## 2023-07-30 DIAGNOSIS — Z89511 Acquired absence of right leg below knee: Secondary | ICD-10-CM | POA: Diagnosis not present

## 2023-07-30 DIAGNOSIS — H548 Legal blindness, as defined in USA: Secondary | ICD-10-CM | POA: Diagnosis not present

## 2023-07-30 DIAGNOSIS — I10 Essential (primary) hypertension: Secondary | ICD-10-CM | POA: Diagnosis not present

## 2023-07-30 DIAGNOSIS — E785 Hyperlipidemia, unspecified: Secondary | ICD-10-CM | POA: Diagnosis not present

## 2023-08-01 ENCOUNTER — Other Ambulatory Visit (HOSPITAL_BASED_OUTPATIENT_CLINIC_OR_DEPARTMENT_OTHER): Payer: Self-pay

## 2023-08-02 ENCOUNTER — Other Ambulatory Visit (HOSPITAL_COMMUNITY): Payer: Self-pay

## 2023-08-03 DIAGNOSIS — E785 Hyperlipidemia, unspecified: Secondary | ICD-10-CM | POA: Diagnosis not present

## 2023-08-03 DIAGNOSIS — R1310 Dysphagia, unspecified: Secondary | ICD-10-CM | POA: Diagnosis not present

## 2023-08-03 DIAGNOSIS — K219 Gastro-esophageal reflux disease without esophagitis: Secondary | ICD-10-CM | POA: Diagnosis not present

## 2023-08-03 DIAGNOSIS — H548 Legal blindness, as defined in USA: Secondary | ICD-10-CM | POA: Diagnosis not present

## 2023-08-03 DIAGNOSIS — E1151 Type 2 diabetes mellitus with diabetic peripheral angiopathy without gangrene: Secondary | ICD-10-CM | POA: Diagnosis not present

## 2023-08-03 DIAGNOSIS — G546 Phantom limb syndrome with pain: Secondary | ICD-10-CM | POA: Diagnosis not present

## 2023-08-03 DIAGNOSIS — Z89511 Acquired absence of right leg below knee: Secondary | ICD-10-CM | POA: Diagnosis not present

## 2023-08-03 DIAGNOSIS — Z4781 Encounter for orthopedic aftercare following surgical amputation: Secondary | ICD-10-CM | POA: Diagnosis not present

## 2023-08-03 DIAGNOSIS — I10 Essential (primary) hypertension: Secondary | ICD-10-CM | POA: Diagnosis not present

## 2023-08-04 DIAGNOSIS — K219 Gastro-esophageal reflux disease without esophagitis: Secondary | ICD-10-CM | POA: Diagnosis not present

## 2023-08-04 DIAGNOSIS — E785 Hyperlipidemia, unspecified: Secondary | ICD-10-CM | POA: Diagnosis not present

## 2023-08-04 DIAGNOSIS — E1151 Type 2 diabetes mellitus with diabetic peripheral angiopathy without gangrene: Secondary | ICD-10-CM | POA: Diagnosis not present

## 2023-08-04 DIAGNOSIS — I10 Essential (primary) hypertension: Secondary | ICD-10-CM | POA: Diagnosis not present

## 2023-08-04 DIAGNOSIS — H548 Legal blindness, as defined in USA: Secondary | ICD-10-CM | POA: Diagnosis not present

## 2023-08-04 DIAGNOSIS — Z89511 Acquired absence of right leg below knee: Secondary | ICD-10-CM | POA: Diagnosis not present

## 2023-08-04 DIAGNOSIS — G546 Phantom limb syndrome with pain: Secondary | ICD-10-CM | POA: Diagnosis not present

## 2023-08-04 DIAGNOSIS — R1310 Dysphagia, unspecified: Secondary | ICD-10-CM | POA: Diagnosis not present

## 2023-08-04 DIAGNOSIS — Z4781 Encounter for orthopedic aftercare following surgical amputation: Secondary | ICD-10-CM | POA: Diagnosis not present

## 2023-08-05 DIAGNOSIS — I10 Essential (primary) hypertension: Secondary | ICD-10-CM | POA: Diagnosis not present

## 2023-08-05 DIAGNOSIS — R1310 Dysphagia, unspecified: Secondary | ICD-10-CM | POA: Diagnosis not present

## 2023-08-05 DIAGNOSIS — H548 Legal blindness, as defined in USA: Secondary | ICD-10-CM | POA: Diagnosis not present

## 2023-08-05 DIAGNOSIS — K219 Gastro-esophageal reflux disease without esophagitis: Secondary | ICD-10-CM | POA: Diagnosis not present

## 2023-08-05 DIAGNOSIS — Z89511 Acquired absence of right leg below knee: Secondary | ICD-10-CM | POA: Diagnosis not present

## 2023-08-05 DIAGNOSIS — G546 Phantom limb syndrome with pain: Secondary | ICD-10-CM | POA: Diagnosis not present

## 2023-08-05 DIAGNOSIS — E1151 Type 2 diabetes mellitus with diabetic peripheral angiopathy without gangrene: Secondary | ICD-10-CM | POA: Diagnosis not present

## 2023-08-05 DIAGNOSIS — Z4781 Encounter for orthopedic aftercare following surgical amputation: Secondary | ICD-10-CM | POA: Diagnosis not present

## 2023-08-05 DIAGNOSIS — E785 Hyperlipidemia, unspecified: Secondary | ICD-10-CM | POA: Diagnosis not present

## 2023-08-09 DIAGNOSIS — G546 Phantom limb syndrome with pain: Secondary | ICD-10-CM | POA: Diagnosis not present

## 2023-08-09 DIAGNOSIS — I10 Essential (primary) hypertension: Secondary | ICD-10-CM | POA: Diagnosis not present

## 2023-08-09 DIAGNOSIS — H548 Legal blindness, as defined in USA: Secondary | ICD-10-CM | POA: Diagnosis not present

## 2023-08-09 DIAGNOSIS — R1310 Dysphagia, unspecified: Secondary | ICD-10-CM | POA: Diagnosis not present

## 2023-08-09 DIAGNOSIS — E785 Hyperlipidemia, unspecified: Secondary | ICD-10-CM | POA: Diagnosis not present

## 2023-08-09 DIAGNOSIS — Z4781 Encounter for orthopedic aftercare following surgical amputation: Secondary | ICD-10-CM | POA: Diagnosis not present

## 2023-08-09 DIAGNOSIS — E1151 Type 2 diabetes mellitus with diabetic peripheral angiopathy without gangrene: Secondary | ICD-10-CM | POA: Diagnosis not present

## 2023-08-09 DIAGNOSIS — K219 Gastro-esophageal reflux disease without esophagitis: Secondary | ICD-10-CM | POA: Diagnosis not present

## 2023-08-09 DIAGNOSIS — Z89511 Acquired absence of right leg below knee: Secondary | ICD-10-CM | POA: Diagnosis not present

## 2023-08-10 ENCOUNTER — Inpatient Hospital Stay: Payer: Medicare HMO | Admitting: Neurology

## 2023-08-12 DIAGNOSIS — Z4781 Encounter for orthopedic aftercare following surgical amputation: Secondary | ICD-10-CM | POA: Diagnosis not present

## 2023-08-12 DIAGNOSIS — I10 Essential (primary) hypertension: Secondary | ICD-10-CM | POA: Diagnosis not present

## 2023-08-12 DIAGNOSIS — Z89511 Acquired absence of right leg below knee: Secondary | ICD-10-CM | POA: Diagnosis not present

## 2023-08-12 DIAGNOSIS — H548 Legal blindness, as defined in USA: Secondary | ICD-10-CM | POA: Diagnosis not present

## 2023-08-12 DIAGNOSIS — E785 Hyperlipidemia, unspecified: Secondary | ICD-10-CM | POA: Diagnosis not present

## 2023-08-12 DIAGNOSIS — R1310 Dysphagia, unspecified: Secondary | ICD-10-CM | POA: Diagnosis not present

## 2023-08-12 DIAGNOSIS — G546 Phantom limb syndrome with pain: Secondary | ICD-10-CM | POA: Diagnosis not present

## 2023-08-12 DIAGNOSIS — K219 Gastro-esophageal reflux disease without esophagitis: Secondary | ICD-10-CM | POA: Diagnosis not present

## 2023-08-12 DIAGNOSIS — E1151 Type 2 diabetes mellitus with diabetic peripheral angiopathy without gangrene: Secondary | ICD-10-CM | POA: Diagnosis not present

## 2023-08-16 ENCOUNTER — Encounter: Payer: Medicare HMO | Attending: Physical Medicine and Rehabilitation | Admitting: Physical Medicine and Rehabilitation

## 2023-08-17 ENCOUNTER — Inpatient Hospital Stay: Payer: Medicare HMO | Admitting: Neurology

## 2023-08-17 ENCOUNTER — Encounter: Payer: Self-pay | Admitting: Neurology

## 2023-08-17 NOTE — Progress Notes (Deleted)
Patient: [name]. Date of Birth: [birth year]  Reason for Visit: Follow up History from: Patient Primary Neurologist: Pearlean Brownie (Saw Dr. Roda Shutters)   ASSESSMENT AND PLAN 72 y.o. year old male with right frontal centrum semiovale lacunar infarct felt to be incidental finding likely due to small vessel disease with uncontrolled multiple risk factors.  At the time, patient was admitted for right foot osteomyelitis.  Had multiple uncontrolled risk factors. HTN, HLLD, Type 2 DM,    HISTORY OF PRESENT ILLNESS: Today 08/17/23  HISTORY  On 06/07/23 patient was admitted from the ER with osteomyelitis of right foot.  While admitted developed headaches, dizziness, dysphagia prompting EGD.  Before procedure had significantly high BP and diaphoresis.  MRI of the brain showed small acute perforator infarct in right centrum semiovale.  Since has had some visual difficulties and dropping objects with his left hand.  Neurology felt stroke was very small and did not correlate with most of his deficits.  Was having some episodes of tachycardia, diaphoresis, high blood pressure, nausea.  Could be due to uncontrolled diabetes?  Autonomic dysfunction versus gastroparesis?  Had posterior dysphagia.  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  History of PVD and left BKA in 2016.  He underwent right BKA  9/20.  Went to inpatient rehab 10/1-10/10  -MRI of the brain acute perforator infarct in the right frontal centrum semiovale -MRA head normal -Carotid Doppler unremarkable -2D echo EF 70 to 75% -LDL 76 -A1c 11.1 -Aspirin 81 mg daily prior to admission,  REVIEW OF SYSTEMS: Out of a complete 14 system review of symptoms, the patient complains only of the following symptoms, and all other reviewed systems are negative.  See HPI  ALLERGIES: No Known Allergies  HOME MEDICATIONS: Outpatient Medications Prior to Visit  Medication Sig Dispense Refill    acetaminophen (TYLENOL) 325 MG tablet Take 1-2 tablets (325-650 mg total) by mouth every 4 (four) hours as needed for mild pain.     ascorbic acid (VITAMIN C) 1000 MG tablet Take 1 tablet (1,000 mg total) by mouth daily. 100 tablet 0   aspirin EC 81 MG tablet Take 81 mg by mouth daily.     atorvastatin (LIPITOR) 80 MG tablet Take 40 mg by mouth at bedtime.      vitamin D3 (CHOLECALCIFEROL) 25 MCG tablet Take 4 tablets (4,000 Units total) by mouth daily. 100 tablet 0   gabapentin (NEURONTIN) 100 MG capsule Take 1 capsule (100 mg total) by mouth 2 (two) times daily AND 2 capsules (200 mg total) at bedtime. 120 capsule 0   insulin glargine (LANTUS SOLOSTAR) 100 UNIT/ML Solostar Pen Inject 13 Units into the skin daily.     losartan (COZAAR) 25 MG tablet Take 0.5 tablets (12.5 mg total) by mouth daily. 15 tablet 0   metoCLOPramide (REGLAN) 5 MG tablet Take 1 tablet (5 mg total) by mouth 3 (three) times daily before meals. 21 tablet 0   mycophenolate (CELLCEPT) 250 MG capsule Take 1,000 mg by mouth 2 (two) times daily.     oxyCODONE (OXY IR/ROXICODONE) 5 MG immediate release tablet Take 1 tablet (5 mg total) by mouth every 4 (four) hours as needed for severe pain. 21 tablet 0   pantoprazole (PROTONIX) 40 MG tablet Take 1 tablet (40 mg total) by mouth daily. 30 tablet 3   pantoprazole (PROTONIX) 40 MG tablet Take 1 tablet (40 mg total) by mouth 2 (two) times daily.  30 tablet 0   tacrolimus (PROGRAF) 5 MG capsule Take 5 mg by mouth 2 (two) times daily.     traZODone (DESYREL) 50 MG tablet Take 0.5-1 tablets (25-50 mg total) by mouth at bedtime as needed for sleep. 30 tablet 0   No facility-administered medications prior to visit.    PAST MEDICAL HISTORY: Past Medical History:  Diagnosis Date   Anxiety    COVID-19 virus infection 09/2020   CPAP (continuous positive airway pressure) dependence    Diabetes mellitus without complication (HCC)    GERD (gastroesophageal reflux disease)     Hypertension    Legally blind in left eye, as defined in [place]    PTSD (post-traumatic stress disorder)    PVD (peripheral vascular disease) (HCC) 01/17/2015   Renal disorder    Sleep apnea    Thyroid disease     PAST SURGICAL HISTORY: Past Surgical History:  Procedure Laterality Date   ABDOMINAL AORTAGRAM N/A 01/21/2015   Procedure: ABDOMINAL Ronny Flurry;  Surgeon: Chuck Hint, MD;  Location: [facility] CATH LAB;  Service: Cardiovascular;  Laterality: N/A;   ABDOMINAL AORTOGRAM W/LOWER EXTREMITY Right 06/15/2023   Procedure: ABDOMINAL AORTOGRAM W/LOWER EXTREMITY;  Surgeon: Nada Libman, MD;  Location: [facility] INVASIVE CV LAB;  Service: Cardiovascular;  Laterality: Right;   AMPUTATION Left 01/23/2015   Procedure: FIRST RAY AMPUTATION/LEFT FOOT;  Surgeon: Nadara Mustard, MD;  Location: [facility] OR;  Service: Orthopedics;  Laterality: Left;   AMPUTATION Left 02/20/2015   Procedure: AMPUTATION BELOW KNEE;  Surgeon: Nadara Mustard, MD;  Location: [facility] OR;  Service: Orthopedics;  Laterality: Left;   AMPUTATION Right 06/18/2023   Procedure: RIGHT BELOW KNEE AMPUTATION;  Surgeon: Nadara Mustard, MD;  Location: [facility] OR;  Service: Orthopedics;  Laterality: Right;   AV FISTULA PLACEMENT     BALLOON DILATION N/A 02/26/2021   Procedure: BALLOON DILATION;  Surgeon: Hilarie Fredrickson, MD;  Location: [facility] ENDOSCOPY;  Service: Endoscopy;  Laterality: N/A;   BIOPSY  02/26/2021   Procedure: BIOPSY;  Surgeon: Hilarie Fredrickson, MD;  Location: [facility] ENDOSCOPY;  Service: Endoscopy;;   COLONOSCOPY     COMBINED KIDNEY-PANCREAS TRANSPLANT Left 2009   ESOPHAGOGASTRODUODENOSCOPY (EGD) WITH PROPOFOL N/A 02/26/2021   Procedure: ESOPHAGOGASTRODUODENOSCOPY (EGD) WITH PROPOFOL;  Surgeon: Hilarie Fredrickson, MD;  Location: [facility] ENDOSCOPY;  Service: Endoscopy;  Laterality: N/A;   INCISION AND DRAINAGE ABSCESS Left 01/13/2015   Procedure: INCISION AND DRAINAGE OF LEFT FOOT FIRST RAY ABCESS;  Surgeon: Kathryne Hitch, MD;  Location: [facility] OR;  Service:  Orthopedics;  Laterality: Left;   LOWER EXTREMITY ANGIOGRAM Left 01/21/2015   Procedure: LOWER EXTREMITY ANGIOGRAM;  Surgeon: Chuck Hint, MD;  [facility] CATH LAB;  Service: Cardiovascular;  Laterality: Left;   PERIPHERAL VASCULAR BALLOON ANGIOPLASTY Right 06/15/2023   Procedure: PERIPHERAL VASCULAR BALLOON ANGIOPLASTY;  Surgeon: Nada Libman, MD;  Location: [facility] INVASIVE CV LAB;  Service: Cardiovascular;  Laterality: Right;  right AT    FAMILY HISTORY: Family History  Problem Relation Age of Onset   Hypertension Other     SOCIAL HISTORY: Social History   Socioeconomic History   Marital status: Married    Spouse name: Not on file   Number of children: Not on file   Years of education: Not on file   Highest education level: Not on file  Occupational History   Not on file  Tobacco Use   Smoking status: Former   Smokeless tobacco: Never   Tobacco comments:    quit  i [id number]  Substance and Sexual Activity   Alcohol use: Yes    Comment: 1 beer rarely    Drug use: Yes    Types: Marijuana    Comment: last smoked in August 2024   Sexual activity: Not on file  Other Topics Concern   Not on file  Social History Narrative   Not on file   Social Determinants of Health   Financial Resource Strain: Not on file  Food Insecurity: No Food Insecurity (06/07/2023)   Hunger Vital Sign    Worried About Running Out of Food in the Last Year: Never true    Ran Out of Food in the Last Year: Never true  Transportation Needs: Unmet Transportation Needs (06/07/2023)   PRAPARE - Administrator, Civil Service (Medical): Yes    Lack of Transportation (Non-Medical): No  Physical Activity: Not on file  Stress: Not on file  Social Connections: Unknown (02/06/2022)   Received from [facility]   Social Network    Social Network: Not on file  Intimate Partner Violence: Not At Risk (06/07/2023)   Humiliation, Afraid, Rape, and Kick questionnaire    Fear of  Current or Ex-Partner: No    Emotionally Abused: No    Physically Abused: No    Sexually Abused: No    PHYSICAL EXAM  There were no vitals filed for this visit. There is no height or weight on file to calculate BMI.  Generalized: Well developed, in no acute distress  Neurological examination  Mentation: Alert oriented to time, place, history taking. Follows all commands speech and language fluent Cranial nerve II-XII: Pupils were equal round reactive to light. Extraocular movements were full, visual field were full on confrontational test. Facial sensation and strength were normal. Uvula tongue midline. Head turning and shoulder shrug  were normal and symmetric. Motor: The motor testing reveals 5 over 5 strength of all 4 extremities. Good symmetric motor tone is noted throughout.  Sensory: Sensory testing is intact to soft touch on all 4 extremities. No evidence of extinction is noted.  Coordination: Cerebellar testing reveals good finger-nose-finger and heel-to-shin bilaterally.  Gait and station: Gait is normal. Tandem gait is normal. Romberg is negative. No drift is seen.  Reflexes: Deep tendon reflexes are symmetric and normal bilaterally.   DIAGNOSTIC DATA (LABS, IMAGING, TESTING) - I reviewed patient records, labs, notes, testing and imaging myself where available.  Lab Results  Component Value Date   WBC 7.4 07/05/2023   HGB 9.1 (L) 07/05/2023   HCT 28.7 (L) 07/05/2023   MCV 81.3 07/05/2023   PLT 274 07/05/2023      Component Value Date/Time   NA 136 07/06/2023 0539   K 4.9 07/06/2023 0539   CL 103 07/06/2023 0539   CO2 23 07/06/2023 0539   GLUCOSE 90 07/06/2023 0539   BUN 25 (H) 07/06/2023 0539   CREATININE 1.27 (H) 07/06/2023 0539   CALCIUM 9.6 07/06/2023 0539   PROT 6.4 (L) 06/30/2023 0517   ALBUMIN 3.0 (L) 06/30/2023 0517   AST 15 06/30/2023 0517   ALT 25 06/30/2023 0517   ALKPHOS 89 06/30/2023 0517   BILITOT 0.3 06/30/2023 0517   GFRNONAA >60 07/06/2023  0539   GFRAA >60 06/25/2020 1819   Lab Results  Component Value Date   CHOL 130 06/12/2023   HDL 23 (L) 06/12/2023   LDLCALC 76 06/12/2023   TRIG 155 (H) 06/12/2023   CHOLHDL 5.7 06/12/2023   Lab Results  Component Value  Date   HGBA1C 11.1 (H) 06/07/2023   No results found for: "VITAMINB12" Lab Results  Component Value Date   TSH 1.563 06/07/2023    Margie Ege, AGNP-C, DNP 08/17/2023, 5:37 AM [facility] [address], [address] [place], [place] [address] [phone]

## 2023-08-18 ENCOUNTER — Other Ambulatory Visit (HOSPITAL_COMMUNITY): Payer: Self-pay

## 2023-08-18 DIAGNOSIS — E1151 Type 2 diabetes mellitus with diabetic peripheral angiopathy without gangrene: Secondary | ICD-10-CM | POA: Diagnosis not present

## 2023-08-18 DIAGNOSIS — I10 Essential (primary) hypertension: Secondary | ICD-10-CM | POA: Diagnosis not present

## 2023-08-18 DIAGNOSIS — E785 Hyperlipidemia, unspecified: Secondary | ICD-10-CM | POA: Diagnosis not present

## 2023-08-18 DIAGNOSIS — Z4781 Encounter for orthopedic aftercare following surgical amputation: Secondary | ICD-10-CM | POA: Diagnosis not present

## 2023-08-18 DIAGNOSIS — H548 Legal blindness, as defined in USA: Secondary | ICD-10-CM | POA: Diagnosis not present

## 2023-08-18 DIAGNOSIS — K219 Gastro-esophageal reflux disease without esophagitis: Secondary | ICD-10-CM | POA: Diagnosis not present

## 2023-08-18 DIAGNOSIS — R1310 Dysphagia, unspecified: Secondary | ICD-10-CM | POA: Diagnosis not present

## 2023-08-18 DIAGNOSIS — Z89511 Acquired absence of right leg below knee: Secondary | ICD-10-CM | POA: Diagnosis not present

## 2023-08-18 DIAGNOSIS — G546 Phantom limb syndrome with pain: Secondary | ICD-10-CM | POA: Diagnosis not present

## 2023-08-25 DIAGNOSIS — G546 Phantom limb syndrome with pain: Secondary | ICD-10-CM | POA: Diagnosis not present

## 2023-08-25 DIAGNOSIS — K219 Gastro-esophageal reflux disease without esophagitis: Secondary | ICD-10-CM | POA: Diagnosis not present

## 2023-08-25 DIAGNOSIS — I10 Essential (primary) hypertension: Secondary | ICD-10-CM | POA: Diagnosis not present

## 2023-08-25 DIAGNOSIS — R1310 Dysphagia, unspecified: Secondary | ICD-10-CM | POA: Diagnosis not present

## 2023-08-25 DIAGNOSIS — E1151 Type 2 diabetes mellitus with diabetic peripheral angiopathy without gangrene: Secondary | ICD-10-CM | POA: Diagnosis not present

## 2023-08-25 DIAGNOSIS — Z4781 Encounter for orthopedic aftercare following surgical amputation: Secondary | ICD-10-CM | POA: Diagnosis not present

## 2023-08-25 DIAGNOSIS — H548 Legal blindness, as defined in USA: Secondary | ICD-10-CM | POA: Diagnosis not present

## 2023-08-25 DIAGNOSIS — E785 Hyperlipidemia, unspecified: Secondary | ICD-10-CM | POA: Diagnosis not present

## 2023-08-25 DIAGNOSIS — Z89511 Acquired absence of right leg below knee: Secondary | ICD-10-CM | POA: Diagnosis not present

## 2023-09-01 ENCOUNTER — Inpatient Hospital Stay: Payer: Medicare HMO | Admitting: Neurology

## 2023-09-01 ENCOUNTER — Telehealth: Payer: Self-pay | Admitting: Neurology

## 2023-09-01 ENCOUNTER — Encounter: Payer: Self-pay | Admitting: Neurology

## 2023-09-01 DIAGNOSIS — Z89511 Acquired absence of right leg below knee: Secondary | ICD-10-CM | POA: Diagnosis not present

## 2023-09-01 NOTE — Telephone Encounter (Signed)
Patient no-showed 2 office appointments for hospital follow-up. 09/01/23 and 08/17/23.

## 2023-09-01 NOTE — Progress Notes (Unsigned)
Patient: [name]. Date of Birth: Sep 23, 1951  Reason for Visit: Follow up History from: Patient Primary Neurologist: Pearlean Brownie (Saw Dr. Roda Shutters)   ASSESSMENT AND PLAN 72 y.o. year old male with right frontal centrum semiovale lacunar infarct felt to be incidental finding likely due to small vessel disease with uncontrolled multiple risk factors.  At the time, patient was admitted for right foot osteomyelitis.  Had multiple uncontrolled risk factors. HTN, HLLD, Type 2 DM, on home CPAP.  During hospitalization episodes of tachycardia, diaphoresis, hypertension, nausea.  Concerning for autonomic dysfunction versus gastroparesis versus uncontrolled diabetes.   HISTORY OF PRESENT ILLNESS: Today 09/01/23  HISTORY  On 06/07/23 patient was admitted from the ER with osteomyelitis of right foot. While admitted developed headaches, dizziness, dysphagia prompting EGD.  Before procedure had significantly high BP and diaphoresis.  MRI of the brain showed small acute perforator infarct in right centrum semiovale.  Since has had some visual difficulties and dropping objects with his left hand.  Neurology felt stroke was very small and did not correlate with most of his deficits.  Was having some episodes of tachycardia, diaphoresis, high blood pressure, nausea.  Could be due to uncontrolled diabetes?  Autonomic dysfunction versus gastroparesis?  Had dysphagia.  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  History of PVD and left BKA in 2016.  He underwent right BKA  9/20.  Went to inpatient rehab 10/1-10/10  -MRI of the brain acute perforator infarct in the right frontal centrum semiovale -MRA head normal -Carotid Doppler unremarkable -2D echo EF 70 to 75% -LDL 76, Lipitor was increased to 80. -A1c 11.1 -Aspirin 81 mg daily prior to admission, 3 weeks DAPT aspirin Plavix with clearance from GI/vascular then aspirin 81 mg alone  REVIEW OF  SYSTEMS: Out of a complete 14 system review of symptoms, the patient complains only of the following symptoms, and all other reviewed systems are negative.  See HPI  ALLERGIES: No Known Allergies  HOME MEDICATIONS: Outpatient Medications Prior to Visit  Medication Sig Dispense Refill   acetaminophen (TYLENOL) 325 MG tablet Take 1-2 tablets (325-650 mg total) by mouth every 4 (four) hours as needed for mild pain.     ascorbic acid (VITAMIN C) 1000 MG tablet Take 1 tablet (1,000 mg total) by mouth daily. 100 tablet 0   aspirin EC 81 MG tablet Take 81 mg by mouth daily.     atorvastatin (LIPITOR) 80 MG tablet Take 40 mg by mouth at bedtime.      vitamin D3 (CHOLECALCIFEROL) 25 MCG tablet Take 4 tablets (4,000 Units total) by mouth daily. 100 tablet 0   gabapentin (NEURONTIN) 100 MG capsule Take 1 capsule (100 mg total) by mouth 2 (two) times daily AND 2 capsules (200 mg total) at bedtime. 120 capsule 0   insulin glargine (LANTUS SOLOSTAR) 100 UNIT/ML Solostar Pen Inject 13 Units into the skin daily.     losartan (COZAAR) 25 MG tablet Take 0.5 tablets (12.5 mg total) by mouth daily. 15 tablet 0   metoCLOPramide (REGLAN) 5 MG tablet Take 1 tablet (5 mg total) by mouth 3 (three) times daily before meals. 21 tablet 0   mycophenolate (CELLCEPT) 250 MG capsule Take 1,000 mg by mouth 2 (two) times daily.     oxyCODONE (OXY IR/ROXICODONE) 5 MG immediate release tablet Take 1 tablet (5 mg total) by mouth every 4 (four) hours as needed for severe pain. 21 tablet 0  pantoprazole (PROTONIX) 40 MG tablet Take 1 tablet (40 mg total) by mouth daily. 30 tablet 3   pantoprazole (PROTONIX) 40 MG tablet Take 1 tablet (40 mg total) by mouth 2 (two) times daily. 30 tablet 0   tacrolimus (PROGRAF) 5 MG capsule Take 5 mg by mouth 2 (two) times daily.     traZODone (DESYREL) 50 MG tablet Take 0.5-1 tablets (25-50 mg total) by mouth at bedtime as needed for sleep. 30 tablet 0   No facility-administered medications  prior to visit.    PAST MEDICAL HISTORY: Past Medical History:  Diagnosis Date   Anxiety    COVID-19 virus infection 09/2020   CPAP (continuous positive airway pressure) dependence    Diabetes mellitus without complication (HCC)    GERD (gastroesophageal reflux disease)    Hypertension    Legally blind in left eye, as defined in [place]    PTSD (post-traumatic stress disorder)    PVD (peripheral vascular disease) (HCC) 01/17/2015   Renal disorder    Sleep apnea    Thyroid disease     PAST SURGICAL HISTORY: Past Surgical History:  Procedure Laterality Date   ABDOMINAL AORTAGRAM N/A 01/21/2015   Procedure: ABDOMINAL Ronny Flurry;  Surgeon: Chuck Hint, MD;  Location: [facility] CATH LAB;  Service: Cardiovascular;  Laterality: N/A;   ABDOMINAL AORTOGRAM W/LOWER EXTREMITY Right 06/15/2023   Procedure: ABDOMINAL AORTOGRAM W/LOWER EXTREMITY;  Surgeon: Nada Libman, MD;  Location: [facility] INVASIVE CV LAB;  Service: Cardiovascular;  Laterality: Right;   AMPUTATION Left 01/23/2015   Procedure: FIRST RAY AMPUTATION/LEFT FOOT;  Surgeon: Nadara Mustard, MD;  Location: [facility] OR;  Service: Orthopedics;  Laterality: Left;   AMPUTATION Left 02/20/2015   Procedure: AMPUTATION BELOW KNEE;  Surgeon: Nadara Mustard, MD;  Location: [facility] OR;  Service: Orthopedics;  Laterality: Left;   AMPUTATION Right 06/18/2023   Procedure: RIGHT BELOW KNEE AMPUTATION;  Surgeon: Nadara Mustard, MD;  Location: [facility] OR;  Service: Orthopedics;  Laterality: Right;   AV FISTULA PLACEMENT     BALLOON DILATION N/A 02/26/2021   Procedure: BALLOON DILATION;  Surgeon: Hilarie Fredrickson, MD;  Location: [facility] ENDOSCOPY;  Service: Endoscopy;  Laterality: N/A;   BIOPSY  02/26/2021   Procedure: BIOPSY;  Surgeon: Hilarie Fredrickson, MD;  Location: [facility] ENDOSCOPY;  Service: Endoscopy;;   COLONOSCOPY     COMBINED KIDNEY-PANCREAS TRANSPLANT Left 2009   ESOPHAGOGASTRODUODENOSCOPY (EGD) WITH PROPOFOL N/A 02/26/2021   Procedure: ESOPHAGOGASTRODUODENOSCOPY (EGD) WITH  PROPOFOL;  Surgeon: Hilarie Fredrickson, MD;  Location: [facility] ENDOSCOPY;  Service: Endoscopy;  Laterality: N/A;   INCISION AND DRAINAGE ABSCESS Left 01/13/2015   Procedure: INCISION AND DRAINAGE OF LEFT FOOT FIRST RAY ABCESS;  Surgeon: Kathryne Hitch, MD;  Location: [facility] OR;  Service: Orthopedics;  Laterality: Left;   LOWER EXTREMITY ANGIOGRAM Left 01/21/2015   Procedure: LOWER EXTREMITY ANGIOGRAM;  Surgeon: Chuck Hint, MD;  Location: [facility] CATH LAB;  Service: Cardiovascular;  Laterality: Left;   PERIPHERAL VASCULAR BALLOON ANGIOPLASTY Right 06/15/2023   Procedure: PERIPHERAL VASCULAR BALLOON ANGIOPLASTY;  Surgeon: Nada Libman, MD;  Location: [facility] INVASIVE CV LAB;  Service: Cardiovascular;  Laterality: Right;  right AT    FAMILY HISTORY: Family History  Problem Relation Age of Onset   Hypertension Other     SOCIAL HISTORY: Social History   Socioeconomic History   Marital status: Married    Spouse name: Not on file   Number of children: Not on file   Years of education: Not on file  Highest education level: Not on file  Occupational History   Not on file  Tobacco Use   Smoking status: Former   Smokeless tobacco: Never   Tobacco comments:    quit i [id number]  Substance and Sexual Activity   Alcohol use: Yes    Comment: 1 beer rarely    Drug use: Yes    Types: Marijuana    Comment: last smoked in August 2024   Sexual activity: Not on file  Other Topics Concern   Not on file  Social History Narrative   Not on file   Social Determinants of Health   Financial Resource Strain: Not on file  Food Insecurity: No Food Insecurity (06/07/2023)   Hunger Vital Sign    Worried About Running Out of Food in the Last Year: Never true    Ran Out of Food in the Last Year: Never true  Transportation Needs: Unmet Transportation Needs (06/07/2023)   PRAPARE - Administrator, Civil Service (Medical): Yes    Lack of Transportation (Non-Medical): No  Physical Activity: Not on  file  Stress: Not on file  Social Connections: Unknown (02/06/2022)   Received from [facility]   Social Network    Social Network: Not on file  Intimate Partner Violence: Not At Risk (06/07/2023)   Humiliation, Afraid, Rape, and Kick questionnaire    Fear of Current or Ex-Partner: No    Emotionally Abused: No    Physically Abused: No    Sexually Abused: No    PHYSICAL EXAM  There were no vitals filed for this visit. There is no height or weight on file to calculate BMI.  Generalized: Well developed, in no acute distress  Neurological examination  Mentation: Alert oriented to time, place, history taking. Follows all commands speech and language fluent Cranial nerve II-XII: Pupils were equal round reactive to light. Extraocular movements were full, visual field were full on confrontational test. Facial sensation and strength were normal. Uvula tongue midline. Head turning and shoulder shrug  were normal and symmetric. Motor: The motor testing reveals 5 over 5 strength of all 4 extremities. Good symmetric motor tone is noted throughout.  Sensory: Sensory testing is intact to soft touch on all 4 extremities. No evidence of extinction is noted.  Coordination: Cerebellar testing reveals good finger-nose-finger and heel-to-shin bilaterally.  Gait and station: Gait is normal. Tandem gait is normal. Romberg is negative. No drift is seen.  Reflexes: Deep tendon reflexes are symmetric and normal bilaterally.   DIAGNOSTIC DATA (LABS, IMAGING, TESTING) - I reviewed patient records, labs, notes, testing and imaging myself where available.  Lab Results  Component Value Date   WBC 7.4 07/05/2023   HGB 9.1 (L) 07/05/2023   HCT 28.7 (L) 07/05/2023   MCV 81.3 07/05/2023   PLT 274 07/05/2023      Component Value Date/Time   NA 136 07/06/2023 0539   K 4.9 07/06/2023 0539   CL 103 07/06/2023 0539   CO2 23 07/06/2023 0539   GLUCOSE 90 07/06/2023 0539   BUN 25 (H) 07/06/2023  0539   CREATININE 1.27 (H) 07/06/2023 0539   CALCIUM 9.6 07/06/2023 0539   PROT 6.4 (L) 06/30/2023 0517   ALBUMIN 3.0 (L) 06/30/2023 0517   AST 15 06/30/2023 0517   ALT 25 06/30/2023 0517   ALKPHOS 89 06/30/2023 0517   BILITOT 0.3 06/30/2023 0517   GFRNONAA >60 07/06/2023 0539   GFRAA >60 06/25/2020 1819   Lab Results  Component Value  Date   CHOL 130 06/12/2023   HDL 23 (L) 06/12/2023   LDLCALC 76 06/12/2023   TRIG 155 (H) 06/12/2023   CHOLHDL 5.7 06/12/2023   Lab Results  Component Value Date   HGBA1C 11.1 (H) 06/07/2023   No results found for: "VITAMINB12" Lab Results  Component Value Date   TSH 1.563 06/07/2023    Margie Ege, AGNP-C, DNP 09/01/2023, 5:31 AM [facility] [address] [place], [place] [address] [phone]

## 2023-09-10 DIAGNOSIS — Z89512 Acquired absence of left leg below knee: Secondary | ICD-10-CM | POA: Diagnosis not present

## 2023-09-23 ENCOUNTER — Ambulatory Visit: Payer: Medicare HMO | Admitting: Internal Medicine

## 2023-09-30 ENCOUNTER — Ambulatory Visit: Payer: Medicare HMO | Admitting: Internal Medicine

## 2024-04-14 ENCOUNTER — Telehealth: Payer: Self-pay

## 2024-04-14 NOTE — Telephone Encounter (Signed)
 Request from [facility]  to start patient on [facility] recieved. Patient is not current here at Physical Medicine. Request faxed to Leita DEL. Wile MD [phone] (his PCP).

## 2024-05-09 ENCOUNTER — Inpatient Hospital Stay (HOSPITAL_COMMUNITY)
Admission: EM | Admit: 2024-05-09 | Discharge: 2024-05-13 | DRG: 176 | Disposition: A | Discharge status: Home / Self Care | Attending: Internal Medicine | Admitting: Internal Medicine

## 2024-05-09 ENCOUNTER — Other Ambulatory Visit: Payer: Self-pay

## 2024-05-09 ENCOUNTER — Emergency Department (HOSPITAL_COMMUNITY)

## 2024-05-09 ENCOUNTER — Encounter (HOSPITAL_COMMUNITY): Payer: Self-pay

## 2024-05-09 DIAGNOSIS — Z1152 Encounter for screening for COVID-19: Secondary | ICD-10-CM

## 2024-05-09 DIAGNOSIS — Z79621 Long term (current) use of calcineurin inhibitor: Secondary | ICD-10-CM

## 2024-05-09 DIAGNOSIS — Z8679 Personal history of other diseases of the circulatory system: Secondary | ICD-10-CM

## 2024-05-09 DIAGNOSIS — R6510 Systemic inflammatory response syndrome (SIRS) of non-infectious origin without acute organ dysfunction: Secondary | ICD-10-CM | POA: Diagnosis present

## 2024-05-09 DIAGNOSIS — Z794 Long term (current) use of insulin: Secondary | ICD-10-CM | POA: Diagnosis not present

## 2024-05-09 DIAGNOSIS — E860 Dehydration: Secondary | ICD-10-CM | POA: Diagnosis present

## 2024-05-09 DIAGNOSIS — E119 Type 2 diabetes mellitus without complications: Secondary | ICD-10-CM

## 2024-05-09 DIAGNOSIS — F419 Anxiety disorder, unspecified: Secondary | ICD-10-CM | POA: Diagnosis present

## 2024-05-09 DIAGNOSIS — R000 Tachycardia, unspecified: Secondary | ICD-10-CM | POA: Diagnosis present

## 2024-05-09 DIAGNOSIS — Z79899 Other long term (current) drug therapy: Secondary | ICD-10-CM

## 2024-05-09 DIAGNOSIS — R531 Weakness: Principal | ICD-10-CM

## 2024-05-09 DIAGNOSIS — D638 Anemia in other chronic diseases classified elsewhere: Secondary | ICD-10-CM | POA: Diagnosis not present

## 2024-05-09 DIAGNOSIS — E785 Hyperlipidemia, unspecified: Secondary | ICD-10-CM | POA: Diagnosis present

## 2024-05-09 DIAGNOSIS — Z79624 Long term (current) use of inhibitors of nucleotide synthesis: Secondary | ICD-10-CM

## 2024-05-09 DIAGNOSIS — N400 Enlarged prostate without lower urinary tract symptoms: Secondary | ICD-10-CM | POA: Diagnosis not present

## 2024-05-09 DIAGNOSIS — Z89512 Acquired absence of left leg below knee: Secondary | ICD-10-CM

## 2024-05-09 DIAGNOSIS — D84821 Immunodeficiency due to drugs: Secondary | ICD-10-CM | POA: Diagnosis present

## 2024-05-09 DIAGNOSIS — Z7982 Long term (current) use of aspirin: Secondary | ICD-10-CM

## 2024-05-09 DIAGNOSIS — E1165 Type 2 diabetes mellitus with hyperglycemia: Secondary | ICD-10-CM | POA: Diagnosis present

## 2024-05-09 DIAGNOSIS — E1151 Type 2 diabetes mellitus with diabetic peripheral angiopathy without gangrene: Secondary | ICD-10-CM | POA: Diagnosis present

## 2024-05-09 DIAGNOSIS — D72829 Elevated white blood cell count, unspecified: Secondary | ICD-10-CM | POA: Diagnosis present

## 2024-05-09 DIAGNOSIS — K219 Gastro-esophageal reflux disease without esophagitis: Secondary | ICD-10-CM | POA: Diagnosis present

## 2024-05-09 DIAGNOSIS — F4310 Post-traumatic stress disorder, unspecified: Secondary | ICD-10-CM | POA: Diagnosis present

## 2024-05-09 DIAGNOSIS — I2699 Other pulmonary embolism without acute cor pulmonale: Principal | ICD-10-CM | POA: Diagnosis present

## 2024-05-09 DIAGNOSIS — Z89511 Acquired absence of right leg below knee: Secondary | ICD-10-CM | POA: Diagnosis not present

## 2024-05-09 DIAGNOSIS — H548 Legal blindness, as defined in USA: Secondary | ICD-10-CM | POA: Diagnosis present

## 2024-05-09 DIAGNOSIS — G4730 Sleep apnea, unspecified: Secondary | ICD-10-CM | POA: Diagnosis present

## 2024-05-09 DIAGNOSIS — Z9483 Pancreas transplant status: Secondary | ICD-10-CM

## 2024-05-09 DIAGNOSIS — R519 Headache, unspecified: Secondary | ICD-10-CM | POA: Diagnosis not present

## 2024-05-09 DIAGNOSIS — R29818 Other symptoms and signs involving the nervous system: Secondary | ICD-10-CM | POA: Diagnosis not present

## 2024-05-09 DIAGNOSIS — Z7984 Long term (current) use of oral hypoglycemic drugs: Secondary | ICD-10-CM | POA: Diagnosis not present

## 2024-05-09 DIAGNOSIS — E66812 Obesity, class 2: Secondary | ICD-10-CM | POA: Diagnosis present

## 2024-05-09 DIAGNOSIS — E1142 Type 2 diabetes mellitus with diabetic polyneuropathy: Secondary | ICD-10-CM | POA: Diagnosis present

## 2024-05-09 DIAGNOSIS — I2609 Other pulmonary embolism with acute cor pulmonale: Secondary | ICD-10-CM | POA: Diagnosis not present

## 2024-05-09 DIAGNOSIS — I2510 Atherosclerotic heart disease of native coronary artery without angina pectoris: Secondary | ICD-10-CM | POA: Diagnosis not present

## 2024-05-09 DIAGNOSIS — R918 Other nonspecific abnormal finding of lung field: Secondary | ICD-10-CM | POA: Diagnosis not present

## 2024-05-09 DIAGNOSIS — Z87891 Personal history of nicotine dependence: Secondary | ICD-10-CM

## 2024-05-09 DIAGNOSIS — Z940 Kidney transplant status: Secondary | ICD-10-CM | POA: Diagnosis not present

## 2024-05-09 DIAGNOSIS — I672 Cerebral atherosclerosis: Secondary | ICD-10-CM | POA: Diagnosis not present

## 2024-05-09 DIAGNOSIS — I440 Atrioventricular block, first degree: Secondary | ICD-10-CM | POA: Diagnosis present

## 2024-05-09 DIAGNOSIS — Z8616 Personal history of COVID-19: Secondary | ICD-10-CM | POA: Diagnosis not present

## 2024-05-09 DIAGNOSIS — R112 Nausea with vomiting, unspecified: Secondary | ICD-10-CM | POA: Diagnosis not present

## 2024-05-09 DIAGNOSIS — Z8249 Family history of ischemic heart disease and other diseases of the circulatory system: Secondary | ICD-10-CM

## 2024-05-09 DIAGNOSIS — E039 Hypothyroidism, unspecified: Secondary | ICD-10-CM | POA: Diagnosis present

## 2024-05-09 DIAGNOSIS — Z6835 Body mass index (BMI) 35.0-35.9, adult: Secondary | ICD-10-CM

## 2024-05-09 DIAGNOSIS — I10 Essential (primary) hypertension: Secondary | ICD-10-CM | POA: Diagnosis present

## 2024-05-09 HISTORY — DX: Acquired absence of right leg below knee: Z89.511

## 2024-05-09 HISTORY — DX: Kidney transplant status: Z94.0

## 2024-05-09 LAB — COMPREHENSIVE METABOLIC PANEL WITH GFR
ALT: 15 U/L (ref 0–44)
AST: 13 U/L — ABNORMAL LOW (ref 15–41)
Albumin: 3.9 g/dL (ref 3.5–5.0)
Alkaline Phosphatase: 84 U/L (ref 38–126)
Anion gap: 13 (ref 5–15)
BUN: 20 mg/dL (ref 8–23)
CO2: 23 mmol/L (ref 22–32)
Calcium: 9.7 mg/dL (ref 8.9–10.3)
Chloride: 100 mmol/L (ref 98–111)
Creatinine, Ser: 1.41 mg/dL — ABNORMAL HIGH (ref 0.61–1.24)
GFR, Estimated: 53 mL/min — ABNORMAL LOW (ref >60)
Glucose, Bld: 144 mg/dL — ABNORMAL HIGH (ref 70–99)
Potassium: 3.8 mmol/L (ref 3.5–5.1)
Sodium: 136 mmol/L (ref 135–145)
Total Bilirubin: 1.2 mg/dL (ref 0.0–1.2)
Total Protein: 7.2 g/dL (ref 6.5–8.1)

## 2024-05-09 LAB — LIPASE, BLOOD: Lipase: 21 U/L (ref 11–51)

## 2024-05-09 LAB — URINALYSIS, ROUTINE W REFLEX MICROSCOPIC
Bilirubin Urine: NEGATIVE
Glucose, UA: NEGATIVE mg/dL
Hgb urine dipstick: NEGATIVE
Ketones, ur: NEGATIVE mg/dL
Leukocytes,Ua: NEGATIVE
Nitrite: NEGATIVE
Protein, ur: >=300 mg/dL — AB
Specific Gravity, Urine: 1.017 (ref 1.005–1.030)
pH: 5 (ref 5.0–8.0)

## 2024-05-09 LAB — CBC
HCT: 38.4 % — ABNORMAL LOW (ref 39.0–52.0)
Hemoglobin: 12.3 g/dL — ABNORMAL LOW (ref 13.0–17.0)
MCH: 27 pg (ref 26.0–34.0)
MCHC: 32 g/dL (ref 30.0–36.0)
MCV: 84.2 fL (ref 80.0–100.0)
Platelets: 142 K/uL — ABNORMAL LOW (ref 150–400)
RBC: 4.56 MIL/uL (ref 4.22–5.81)
RDW: 15 % (ref 11.5–15.5)
WBC: 17.7 K/uL — ABNORMAL HIGH (ref 4.0–10.5)
nRBC: 0 % (ref 0.0–0.2)

## 2024-05-09 LAB — RESP PANEL BY RT-PCR (RSV, FLU A&B, COVID)  RVPGX2
Influenza A by PCR: NEGATIVE
Influenza B by PCR: NEGATIVE
Resp Syncytial Virus by PCR: NEGATIVE
SARS Coronavirus 2 by RT PCR: NEGATIVE

## 2024-05-09 LAB — TSH: TSH: 1.617 u[IU]/mL (ref 0.350–4.500)

## 2024-05-09 LAB — D-DIMER, QUANTITATIVE: D-Dimer, Quant: 1.15 ug{FEU}/mL — ABNORMAL HIGH (ref 0.00–0.50)

## 2024-05-09 LAB — LACTIC ACID, PLASMA: Lactic Acid, Venous: 1.3 mmol/L (ref 0.5–1.9)

## 2024-05-09 LAB — I-STAT CG4 LACTIC ACID, ED: Lactic Acid, Venous: 1.3 mmol/L (ref 0.5–1.9)

## 2024-05-09 LAB — CK: Total CK: 37 U/L — ABNORMAL LOW (ref 49–397)

## 2024-05-09 LAB — PROCALCITONIN: Procalcitonin: 0.33 ng/mL

## 2024-05-09 LAB — MAGNESIUM: Magnesium: 1.9 mg/dL (ref 1.7–2.4)

## 2024-05-09 MED ORDER — ATORVASTATIN CALCIUM 40 MG PO TABS
40.0000 mg | ORAL_TABLET | Freq: Every day | ORAL | Status: DC
  Administered 2024-05-09 – 2024-05-12 (×6): 40 mg via ORAL
  Filled 2024-05-09 (×4): qty 1

## 2024-05-09 MED ORDER — ASPIRIN 81 MG PO TBEC
81.0000 mg | DELAYED_RELEASE_TABLET | Freq: Every day | ORAL | Status: DC
  Administered 2024-05-10 – 2024-05-13 (×5): 81 mg via ORAL
  Filled 2024-05-09 (×4): qty 1

## 2024-05-09 MED ORDER — IOHEXOL 350 MG/ML SOLN
75.0000 mL | Freq: Once | INTRAVENOUS | Status: AC | PRN
  Administered 2024-05-09 (×2): 75 mL via INTRAVENOUS

## 2024-05-09 MED ORDER — GABAPENTIN 100 MG PO CAPS
100.0000 mg | ORAL_CAPSULE | Freq: Two times a day (BID) | ORAL | Status: DC
  Administered 2024-05-09 – 2024-05-13 (×11): 100 mg via ORAL
  Filled 2024-05-09 (×8): qty 1

## 2024-05-09 MED ORDER — HEPARIN BOLUS VIA INFUSION
4000.0000 [IU] | Freq: Once | INTRAVENOUS | Status: AC
  Administered 2024-05-09 (×2): 4000 [IU] via INTRAVENOUS
  Filled 2024-05-09: qty 4000

## 2024-05-09 MED ORDER — TACROLIMUS 1 MG PO CAPS
5.0000 mg | ORAL_CAPSULE | Freq: Two times a day (BID) | ORAL | Status: DC
  Administered 2024-05-09 – 2024-05-10 (×4): 5 mg via ORAL
  Filled 2024-05-09 (×3): qty 5

## 2024-05-09 MED ORDER — SODIUM CHLORIDE 0.9 % IV BOLUS
1000.0000 mL | Freq: Once | INTRAVENOUS | Status: AC
  Administered 2024-05-09 (×2): 1000 mL via INTRAVENOUS

## 2024-05-09 MED ORDER — INSULIN ASPART 100 UNIT/ML IJ SOLN
0.0000 [IU] | Freq: Three times a day (TID) | INTRAMUSCULAR | Status: DC
  Administered 2024-05-10 (×4): 2 [IU] via SUBCUTANEOUS
  Administered 2024-05-11 (×3): 3 [IU] via SUBCUTANEOUS
  Administered 2024-05-12: 2 [IU] via SUBCUTANEOUS
  Administered 2024-05-12 – 2024-05-13 (×3): 3 [IU] via SUBCUTANEOUS

## 2024-05-09 MED ORDER — HYDRALAZINE HCL 20 MG/ML IJ SOLN: 10.0000 mg | INTRAMUSCULAR | Status: DC | PRN

## 2024-05-09 MED ORDER — GABAPENTIN 100 MG PO CAPS
200.0000 mg | ORAL_CAPSULE | Freq: Every day | ORAL | Status: DC
  Administered 2024-05-09 – 2024-05-12 (×6): 200 mg via ORAL
  Filled 2024-05-09 (×4): qty 2

## 2024-05-09 MED ORDER — LACTATED RINGERS IV SOLN: INTRAVENOUS | Status: AC

## 2024-05-09 MED ORDER — PANTOPRAZOLE SODIUM 40 MG PO TBEC
40.0000 mg | DELAYED_RELEASE_TABLET | Freq: Two times a day (BID) | ORAL | Status: DC
  Administered 2024-05-09 – 2024-05-13 (×11): 40 mg via ORAL
  Filled 2024-05-09 (×8): qty 1

## 2024-05-09 MED ORDER — INSULIN ASPART 100 UNIT/ML IJ SOLN: 0.0000 [IU] | Freq: Every day | INTRAMUSCULAR | Status: DC

## 2024-05-09 MED ORDER — ACETAMINOPHEN 325 MG PO TABS
650.0000 mg | ORAL_TABLET | Freq: Four times a day (QID) | ORAL | Status: DC | PRN
  Administered 2024-05-10 (×2): 650 mg via ORAL
  Filled 2024-05-09: qty 2

## 2024-05-09 MED ORDER — HEPARIN (PORCINE) 25000 UT/250ML-% IV SOLN
1500.0000 [IU]/h | INTRAVENOUS | Status: DC
  Administered 2024-05-09 (×2): 1250 [IU]/h via INTRAVENOUS
  Administered 2024-05-10 (×2): 1400 [IU]/h via INTRAVENOUS
  Administered 2024-05-11: 1500 [IU]/h via INTRAVENOUS
  Administered 2024-05-11: 1400 [IU]/h via INTRAVENOUS
  Filled 2024-05-09 (×5): qty 250

## 2024-05-09 MED ORDER — MELATONIN 3 MG PO TABS: 3.0000 mg | ORAL_TABLET | Freq: Every evening | ORAL | Status: DC | PRN

## 2024-05-09 MED ORDER — ONDANSETRON HCL 4 MG/2ML IJ SOLN
4.0000 mg | Freq: Four times a day (QID) | INTRAMUSCULAR | Status: DC | PRN
  Administered 2024-05-10 – 2024-05-12 (×7): 4 mg via INTRAVENOUS
  Filled 2024-05-09 (×4): qty 2

## 2024-05-09 MED ORDER — ACETAMINOPHEN 650 MG RE SUPP
650.0000 mg | Freq: Four times a day (QID) | RECTAL | Status: DC | PRN
Start: 2024-05-09 — End: 2024-05-13

## 2024-05-09 MED ORDER — MYCOPHENOLATE MOFETIL 250 MG PO CAPS
1000.0000 mg | ORAL_CAPSULE | Freq: Two times a day (BID) | ORAL | Status: DC
  Administered 2024-05-09 – 2024-05-13 (×11): 1000 mg via ORAL
  Filled 2024-05-09 (×9): qty 4

## 2024-05-09 NOTE — ED Provider Notes (Addendum)
 Physical Exam   Vitals:   05/09/24 1600 05/09/24 1620 05/09/24 1700 05/09/24 1730  BP: (!) 197/79  (!) 184/74 (!) 161/75  Pulse: (!) 106  (!) 108 (!) 102  Resp: 20  17 (!) 23  Temp:  99.2 F (37.3 C)    TempSrc:  Oral    SpO2: 100%  100% 100%  Weight:         Physical Exam Vitals and nursing note reviewed.  HENT:     Head: Normocephalic.  Eyes:     Extraocular Movements: Extraocular movements intact.  Cardiovascular:     Rate and Rhythm: Regular rhythm. Tachycardia present.  Pulmonary:     Effort: Pulmonary effort is normal.     Breath sounds: Normal breath sounds.  Musculoskeletal:     Cervical back: Normal range of motion.     Comments: Moves all extremities spontaneously without difficulty Bilateral BKA's  Skin:    General: Skin is warm and dry.     Comments: AV fistula RUE with palpable thrill  Neurological:     Mental Status: He is alert and oriented to person, place, and time.     Procedures  Procedures  ED Course / MDM    Medical Decision Making Amount and/or Complexity of Data Reviewed Labs: ordered. Radiology: ordered.  Risk Prescription drug management. Decision regarding hospitalization.   Patient received at shift change from previous PA-C Darice Showers, see their note for initial history, physical exam findings, lab/imaging results and interpretations, and initial assessment and plan.  Patient presenting with generalized weakness/fatigue for several days as well as 3 episodes of nausea/vomiting since Sunday.  At time of my assessment, patient states that he has been feeling generally unwell since Sunday, but has not had any additional vomiting since presenting to the emergency department today.  Denies fever at home, ambulates with bilateral LE prosthetics, denies any bed sores/sacral wounds. History of kidney and pancreas transplant in 2009. Signed out with urinalysis and COVID/Flu/RSV pending.   At time of my initial assessment, patient is  tachycardic and tachypneic with a leukocytosis of 17.7, thus meeting SIRS criteria.  I have added on blood cultures and a lactic acid as well as a d-dimer given his persistent tachycardia.   Lactic: 1.3 D-dimer: elevated at 1.15 UA: Notable for significant proteinuria with rare bacteria, negative leukocyte/nitrites, 0-5 white blood cells/hpf COVID/Flu/RSV: Negative  At time of my re-assessment, patient tells me he is beginning to have some left-sided abdominal pain where they put my new kidney in, his abdomen is mildly tender to palpation in this region.  Currently, he denies chest pain/shortness of breath.  Given this as well as D-dimer elevation as noted above, I will proceed with CTA PE study as well as CT abdomen/pelvis.  CTA: 1. Left upper lobe segmental pulmonary embolism. Positive for acute PE with CTevidence of right heart strain (RV/LV Ratio = 1.1) consistent with at least submassive (intermediate risk) PE. The presence of right heart strain has been associated with an increased risk of morbidity and mortality. 2. Mild cardiomegaly. 3. 3 mm right solid pulmonary nodule. No follow-up needed if patient is low-risk.This recommendation follows the consensus statement: Guidelines for Management of Incidental Pulmonary Nodules Detected on CT Images: From the Fleischner Society 2017; Radiology 2017; 284:228-243. 4. Bilateral renal atrophy. Aortic Atherosclerosis (ICD10-I70.0). CT abdomen/pelvis: 1. Single mildly prominent fluid-filled loop of small bowel in the right pelvis. This is nonspecific and most likely represents minimal focal ileus. 2. Normal-appearing left pelvic and lower  abdomen renal transplant. 3. Mildly enlarged prostate gland protruding into the base of the urinary bladder. 4. Small left paraumbilical hernia containing fat.  I was contacted by the radiologist to relay a critical result of a left upper lobe pulmonary embolism with evidence of right heart strain.  I initiated  heparin  per pharmacy dosing, will consult the hospitalist for admission.  I spoke with the hospitalist, Dr. Marcene, who agrees that this patient is appropriate for admission given the findings as above.   After admission, the radiologist called and spoke with Dr. Ruthe to make him aware that an addendum was made to the initial CT abdomen/pelvis read, addendum as follows:  ADDENDUM: On further review of the images, there is a new area of soft tissue stranding in the mesenteric fat in an area of postsurgical clips and staples in the anterior mid abdomen to the right of midline. There is also what appears to be two blind ends of bowel at that location with possible pneumatosis in the walls, suspicious for ischemia. Alternatively, this could represent an area of contained perforation with associated extraluminal and mural gas. Correlation with the patient's exact surgical history related to the bowel in that area would be helpful. This is best seen on images 22-29 of series 4.   Dr. Ruthe and myself spoke with the general surgeon, Dr. Rubin, who personally reviewed these images as well. He feels that this is likely consistent with normal postoperative changes, however there are no recent scans for comparison. He recommends to continue to monitor the patient and if he develops worsening abdominal pain, specifically in the epigastric area, it would be appropriate to repeat the CT scan. This information was discussed with the hospitalist, Dr. Marcene, who is in agreement.       Glendia Rocky SAILOR, [place] 05/09/24 1925    Ruthe Cornet, DO 05/09/24 2017    Glendia Rocky SAILOR, PA-C 05/09/24 2110    Ruthe Cornet, DO 05/09/24 2113

## 2024-05-09 NOTE — Medical Student Note (Incomplete)
 [facility]-EMERGENCY DEPT Provider Student Note For educational purposes for Medical, PA and NP students only and not part of the legal medical record.   CSN: [id number] Arrival date & time: 05/09/24  1010      History   Chief Complaint Chief Complaint  Patient presents with   Emesis   Diarrhea    HPI [name]. is a 73 y.o. male w/ PMH of T2DM, HTN, GERD, TIA, Renal Transplant presenting today for CC of not feeling good x 3 days. Patient endorses significant weakness and fatigue starting Saturday, as well as vomiting x1 Sunday and x1 this AM. Decreased appetite - last meal was Sunday, consumed a little watermelon last night. Reports adequate hydration. No hematemesis, no diarrhea or constipation, no blood in stool. Patient also reports one episode of transient left sided chest pain w/ SOB at rest on Sunday that has since resolved. Dizziness over the past 3 days that became significantly more severe this AM w/ associated blurry vision. Denies changes in sleep, exposures to illnesses, any recent travel, or medication changes. Denies LOC, HA, numbness, fever.The patient has a history of T2DM which he takes insulin  for; patient reports not being med compliant this past week d/t a few nights I fell asleep early. He last checked his blood glucose on Sunday and it was in the 150s.   Emesis Associated symptoms: diarrhea   Diarrhea Associated symptoms: vomiting     Past Medical History:  Diagnosis Date   Anxiety    COVID-19 virus infection 09/2020   CPAP (continuous positive airway pressure) dependence    Diabetes mellitus without complication (HCC)    GERD (gastroesophageal reflux disease)    Hypertension    Legally blind in left eye, as defined in [place]     PTSD (post-traumatic stress disorder)    PVD (peripheral vascular disease) (HCC) 01/17/2015   Renal disorder    Sleep apnea    Thyroid disease     Patient Active Problem List   Diagnosis Date Noted   CVA (cerebral  vascular accident) (HCC) 06/29/2023   Malnutrition of moderate degree 06/23/2023   Constipation 06/23/2023   Dysphagia 06/21/2023   Abnormal barium swallow 06/21/2023   History of esophageal stricture 06/21/2023   Osteomyelitis of great toe of right foot (HCC) 06/07/2023   Sepsis (HCC) 06/06/2023   Gastroparesis 02/23/2021   Pancreas transplant status (HCC) 02/22/2021   Hypertension    COVID-19 virus infection 09/2020   History of left below knee amputation (HCC) 07/22/2020   Type 2 diabetes mellitus with diabetic polyneuropathy, with long-term current use of insulin  (HCC) 07/22/2020   History of simultaneous kidney and pancreas transplant (HCC) 07/22/2020   Dyslipidemia 07/22/2020   DKA (diabetic ketoacidosis) (HCC) 01/25/2020   OSA on CPAP 09/13/2018   Intractable nausea and vomiting 09/12/2018   Kidney transplant recipient 09/12/2018   DKA, type 1 (HCC) 01/05/2018   Nausea and vomiting 10/19/2017   Nausea & vomiting 10/18/2017   Hematemesis 07/24/2017   Hypertensive urgency 07/24/2017   Acute GI bleeding 07/24/2017   Status post below-knee amputation of left lower extremity (HCC) 02/22/2015   S/P BKA (below knee amputation) unilateral (HCC) 02/20/2015   Ischemic ulcer of toe (HCC)    PAD (peripheral artery disease) (HCC)    PVD (peripheral vascular disease) (HCC) 01/17/2015   Cellulitis and abscess of foot 01/13/2015   Cellulitis and abscess of foot excluding toe 01/13/2015   Diabetes mellitus without complication (HCC) 01/13/2015   Leukocytosis 01/13/2015   Diabetes  mellitus with peripheral vascular disease (HCC) 12/19/2007   DYSLIPIDEMIA 12/19/2007   ANEMIA 12/19/2007   ANXIETY 12/19/2007   Essential hypertension 12/19/2007   TRANSIENT ISCHEMIC ATTACK 12/19/2007   PERIPHERAL VASCULAR DISEASE 12/19/2007   RENAL FAILURE 12/19/2007    Past Surgical History:  Procedure Laterality Date   ABDOMINAL AORTAGRAM N/A 01/21/2015   Procedure: ABDOMINAL EZELLA;  Surgeon:  Lonni GORMAN Blade, MD;  Location: [facility] CATH LAB;  Service: Cardiovascular;  Laterality: N/A;   ABDOMINAL AORTOGRAM W/LOWER EXTREMITY Right 06/15/2023   Procedure: ABDOMINAL AORTOGRAM W/LOWER EXTREMITY;  Surgeon: Serene Gaile ORN, MD;  Location: [facility] INVASIVE CV LAB;  Service: Cardiovascular;  Laterality: Right;   AMPUTATION Left 01/23/2015   Procedure: FIRST RAY AMPUTATION/LEFT FOOT;  Surgeon: Jerona Harden GAILS, MD;  Location: [facility] OR;  Service: Orthopedics;  Laterality: Left;   AMPUTATION Left 02/20/2015   Procedure: AMPUTATION BELOW KNEE;  Surgeon: Jerona Harden GAILS, MD;  Location: [facility] OR;  Service: Orthopedics;  Laterality: Left;   AMPUTATION Right 06/18/2023   Procedure: RIGHT BELOW KNEE AMPUTATION;  Surgeon: Harden Jerona GAILS, MD;  Location: [facility] OR;  Service: Orthopedics;  Laterality: Right;   AV FISTULA PLACEMENT     BALLOON DILATION N/A 02/26/2021   Procedure: BALLOON DILATION;  Surgeon: Abran Norleen SAILOR, MD;  Location: [facility] ENDOSCOPY;  Service: Endoscopy;  Laterality: N/A;   BIOPSY  02/26/2021   Procedure: BIOPSY;  Surgeon: Abran Norleen SAILOR, MD;  Location: [facility] ENDOSCOPY;  Service: Endoscopy;;   COLONOSCOPY     COMBINED KIDNEY-PANCREAS TRANSPLANT Left 2009   ESOPHAGOGASTRODUODENOSCOPY (EGD) WITH PROPOFOL  N/A 02/26/2021   Procedure: ESOPHAGOGASTRODUODENOSCOPY (EGD) WITH PROPOFOL ;  Surgeon: Abran Norleen SAILOR, MD;  Location: [facility] ENDOSCOPY;  Service: Endoscopy;  Laterality: N/A;   INCISION AND DRAINAGE ABSCESS Left 01/13/2015   Procedure: INCISION AND DRAINAGE OF LEFT FOOT FIRST RAY ABCESS;  Surgeon: Lonni CINDERELLA Poli, MD;  Location: [facility] OR;  Service: Orthopedics;  Laterality: Left;   LOWER EXTREMITY ANGIOGRAM Left 01/21/2015   Procedure: LOWER EXTREMITY ANGIOGRAM;  Surgeon: Lonni GORMAN Blade, MD;  Location: [facility] CATH LAB;  Service: Cardiovascular;  Laterality: Left;   PERIPHERAL VASCULAR BALLOON ANGIOPLASTY Right 06/15/2023   Procedure: PERIPHERAL VASCULAR BALLOON ANGIOPLASTY;  Surgeon: Serene Gaile ORN, MD;  Location: [facility]  INVASIVE CV LAB;  Service: Cardiovascular;  Laterality: Right;  right AT       Home Medications    Prior to Admission medications   Medication Sig Start Date End Date Taking? Authorizing Provider  acetaminophen  (TYLENOL ) 325 MG tablet Take 1-2 tablets (325-650 mg total) by mouth every 4 (four) hours as needed for mild pain. 07/08/23   Setzer, Sandra J, PA-C  ascorbic acid  (VITAMIN C ) 1000 MG tablet Take 1 tablet (1,000 mg total) by mouth daily. 07/08/23   Setzer, Nena PARAS, PA-C  aspirin  EC 81 MG tablet Take 81 mg by mouth daily.    [provider]  atorvastatin  (LIPITOR ) 80 MG tablet Take 40 mg by mouth at bedtime.     [provider]  vitamin D3 (CHOLECALCIFEROL ) 25 MCG tablet Take 4 tablets (4,000 Units total) by mouth daily. 07/08/23   Setzer, Nena PARAS, PA-C  gabapentin  (NEURONTIN ) 100 MG capsule Take 1 capsule (100 mg total) by mouth 2 (two) times daily AND 2 capsules (200 mg total) at bedtime. 07/08/23   Setzer, Nena PARAS, PA-C  insulin  glargine (LANTUS  SOLOSTAR) 100 UNIT/ML Solostar Pen Inject 13 Units into the skin daily. 07/08/23   Setzer, Nena PARAS, PA-C  losartan  (COZAAR ) 25 MG  tablet Take 0.5 tablets (12.5 mg total) by mouth daily. 07/09/23   Setzer, Sandra J, PA-C  metoCLOPramide  (REGLAN ) 5 MG tablet Take 1 tablet (5 mg total) by mouth 3 (three) times daily before meals. 07/08/23   Setzer, Sandra J, PA-C  mycophenolate  (CELLCEPT ) 250 MG capsule Take 1,000 mg by mouth 2 (two) times daily.    [provider]  oxyCODONE  (OXY IR/ROXICODONE ) 5 MG immediate release tablet Take 1 tablet (5 mg total) by mouth every 4 (four) hours as needed for severe pain. 07/08/23   Setzer, Nena PARAS, PA-C  pantoprazole  (PROTONIX ) 40 MG tablet Take 1 tablet (40 mg total) by mouth daily. 02/27/21   Odell Celinda Balo, MD  pantoprazole  (PROTONIX ) 40 MG tablet Take 1 tablet (40 mg total) by mouth 2 (two) times daily. 07/08/23   Setzer, Sandra J, PA-C  tacrolimus  (PROGRAF ) 5 MG capsule  Take 5 mg by mouth 2 (two) times daily.    [provider]  traZODone  (DESYREL ) 50 MG tablet Take 0.5-1 tablets (25-50 mg total) by mouth at bedtime as needed for sleep. 07/08/23   Setzer, Nena PARAS, PA-C    Family History Family History  Problem Relation Age of Onset   Hypertension Other     Social History Social History   Tobacco Use   Smoking status: Former   Smokeless tobacco: Never   Tobacco comments:    quit i [phone]  Substance Use Topics   Alcohol use: Yes    Comment: 1 beer rarely    Drug use: Yes    Types: Marijuana    Comment: last smoked in August 2024     Allergies   Patient has no known allergies.   Review of Systems Review of Systems  Gastrointestinal:  Positive for diarrhea and vomiting.     Physical Exam Updated Vital Signs BP (!) 141/71   Pulse 94   Temp 98.1 F (36.7 C) (Oral)   Resp 18   Wt 72.6 kg   SpO2 100%   BMI 35.87 kg/m   Physical Exam   ED Treatments / Results  Labs (all labs ordered are listed, but only abnormal results are displayed) Labs Reviewed - No data to display  EKG  Radiology No results found.  Procedures Procedures (including critical care time)  Medications Ordered in ED Medications - No data to display   Initial Impression / Assessment and Plan / ED Course  I have reviewed the triage vital signs and the nursing notes.  Pertinent labs & imaging results that were available during my care of the patient were reviewed by me and considered in my medical decision making (see chart for details).     ***  Final Clinical Impressions(s) / ED Diagnoses   Final diagnoses:  None    New Prescriptions New Prescriptions   No medications on file

## 2024-05-09 NOTE — ED Notes (Signed)
 Pt reported to this writer that his vision is blurred.  Sts it was blurred when he got up to go to the restroom this morning.  Pt is a poor historian but family reports he was in bed prior to 2330.  Pt is unsure what time he woke up this morning.    Pt reports previous stroke and blurred vision was a symptom.  EDP made aware.

## 2024-05-09 NOTE — H&P (Addendum)
 History and Physical      [name]. FMW:[id number] DOB: 10-Nov-1950 DOA: 05/09/2024; DOS: 05/09/2024  PCP: Stephane Leita DEL, MD  Patient coming from: home   I have personally briefly reviewed patient's old medical records in [facility]  Chief Complaint: Generalized weakness  HPI: [name]. is a 73 y.o. male with medical history significant for renal transplant in April 2016, currently on immunosuppressive therapy, bilateral below the knee amputations, type 2 diabetes mellitus, central hypertension, anemia of chronic disease associated with baseline hemoglobin 8-12.5, who is admitted to [facility] on 05/09/2024 with acute left upper lobe pulmonary embolism with CT evidence of right heart strain after presenting from home to [facility] ED complaining of generalized weakness.   The patient reports to 3 days of generalized weakness in the absence of any acute focal weakness.  This been associated with mild shortness of breath on an intermittent basis over that timeframe, in the absence of any associated orthopnea or PND.  Messes with any cough, hemoptysis, wheezing.  No recent chest pain, palpitations, diaphoresis, dizziness, presyncope, or syncope.  Over the last 2 to 3 days, patient has also noted some intermittent nausea resulting in 2-3 episodes of nonbloody, nonbilious emesis.  He denies any associated abdominal discomfort nor any recent diarrhea, and continues to produce flatus.   No recent subjective fever, chills or rigors, or generalized myalgias.  Denies any recent neck stiffness, urinary recent dysuria or gross hematuria.   In inquiring about any recent lower extremity edema, calf tenderness, the patient conveys that he is status post bilateral BKA's, with most recent BKA occurring on the right side in September 2024.  He notes that in spite of his bilateral BKA's, he routinely uses his bilateral leg prosthesis and remains active, ambulating on a daily basis.   No known  history of prior PE or DVT.  Most recent surgical procedure occurred almost a year ago, incidental 2024, at time of aforementioned right BKA.  No recent periods of diminished ambulatory activity nor any recent trauma.  No known history of underlying malignancy.  On a daily baby aspirin  at home.  Otherwise, no additional blood thinners.     ED Course:  Vital signs in the ED were notable for the following: Afebrile; rates in the 90s to low 100; systolic pressures in the 140s to 180s; respiratory rate 16-24, oxygen saturation 98 to 100% on room air.  Labs were notable for the following: CMP was notable for the following: Sodium 136, bicarbonate 33, creatinine 1.41 compared to most recent prior value of 1.27 on 07/06/2023, glucose 144, liver enzymes were within normal limits.  Lipase 2 1.  D-dimer 1.15.  CBC notable for white cell count 7 2700, hemoglobin 12.3 compared to most recent prior value of 9.1 on 07/05/2023.  Lactic acid 1.3.  Urinalysis notable for white blood cells, leukocyte esterase/nitrate negative, demonstrating positive hyaline casts as well as no evidence of hemoglobin or RBCs.  Blood cultures x 2 were collected in the ED.  COVID, influenza, RSV PCR were all negative.  Per my interpretation, EKG in ED demonstrated the following: Sinus rhythm with first-degree AV block, heart rate 95, nonspecific T wave inversion in aVL, otherwise no evidence of T wave versions and no evidence of ST changes, including evidence of ST elevation.  Imaging in the ED, per corresponding formal radiology read, was notable for the following: 1 view chest x-ray showed minimal atelectasis or scarring at the right base, but otherwise no  evidence of acute cardiopulmonary process, clearance of electrocardiac effusion, or pneumothorax.  CTA chest with PE protocol showed left upper lobe segmental pulmonary embolism noted to be of at least submassive nature, with RV/LV ratio of 1.1.  There was CT evidence of right heart  strain.  Otherwise, no evidence of acute cardiopulmonary process, including no evidence of infiltrate, edema, effusion, or pneumothorax.  In the setting of the patient's leukocytosis with chronic immunosuppressive and, to be also checked CT abdomen/pelvis with contrast, which showed a single mildly prominent fluid-filled loop of small bowel in the right pelvis, without any associated evidence of obstruction, abscess, or perforation.  Otherwise, CT abdomen/pelvis shows no evidence of acute intra-abdominal or acute intrapelvic process.  While in the ED, the following were administered: Heparin  bolus followed by initiation heparin  drip.  Normal saline x 1 L bolus.  Subsequently, the patient was admitted for further evaluation management of presenting acute left upper lobe pulmonary embolism with CT evidence of right heart strain, with presentation also notable for the presence of SIRS criteria in the setting of the patient's presenting complaint of generalized weakness.    Review of Systems: As per HPI otherwise 10 point review of systems negative.   Past Medical History:  Diagnosis Date   Anxiety    COVID-19 virus infection 09/2020   CPAP (continuous positive airway pressure) dependence    Diabetes mellitus without complication (HCC)    GERD (gastroesophageal reflux disease)    Hypertension    Legally blind in left eye, as defined in [place]     PTSD (post-traumatic stress disorder)    PVD (peripheral vascular disease) (HCC) 01/17/2015   Renal disorder    Sleep apnea    Thyroid disease     Past Surgical History:  Procedure Laterality Date   ABDOMINAL AORTAGRAM N/A 01/21/2015   Procedure: ABDOMINAL EZELLA;  Surgeon: Lonni GORMAN Blade, MD;  Location: [facility] CATH LAB;  Service: Cardiovascular;  Laterality: N/A;   ABDOMINAL AORTOGRAM W/LOWER EXTREMITY Right 06/15/2023   Procedure: ABDOMINAL AORTOGRAM W/LOWER EXTREMITY;  Surgeon: Serene Gaile ORN, MD;  Location: [facility] INVASIVE CV LAB;  Service:  Cardiovascular;  Laterality: Right;   AMPUTATION Left 01/23/2015   Procedure: FIRST RAY AMPUTATION/LEFT FOOT;  Surgeon: Jerona Harden GAILS, MD;  Location: [facility] OR;  Service: Orthopedics;  Laterality: Left;   AMPUTATION Left 02/20/2015   Procedure: AMPUTATION BELOW KNEE;  Surgeon: Jerona Harden GAILS, MD;  Location: [facility] OR;  Service: Orthopedics;  Laterality: Left;   AMPUTATION Right 06/18/2023   Procedure: RIGHT BELOW KNEE AMPUTATION;  Surgeon: Harden Jerona GAILS, MD;  Location: [facility] OR;  Service: Orthopedics;  Laterality: Right;   AV FISTULA PLACEMENT     BALLOON DILATION N/A 02/26/2021   Procedure: BALLOON DILATION;  Surgeon: Abran Norleen SAILOR, MD;  Location: [facility] ENDOSCOPY;  Service: Endoscopy;  Laterality: N/A;   BIOPSY  02/26/2021   Procedure: BIOPSY;  Surgeon: Abran Norleen SAILOR, MD;  Location: [facility] ENDOSCOPY;  Service: Endoscopy;;   COLONOSCOPY     COMBINED KIDNEY-PANCREAS TRANSPLANT Left 2009   ESOPHAGOGASTRODUODENOSCOPY (EGD) WITH PROPOFOL  N/A 02/26/2021   Procedure: ESOPHAGOGASTRODUODENOSCOPY (EGD) WITH PROPOFOL ;  Surgeon: Abran Norleen SAILOR, MD;  Location: [facility] ENDOSCOPY;  Service: Endoscopy;  Laterality: N/A;   INCISION AND DRAINAGE ABSCESS Left 01/13/2015   Procedure: INCISION AND DRAINAGE OF LEFT FOOT FIRST RAY ABCESS;  Surgeon: Lonni CINDERELLA Poli, MD;  Location: [facility] OR;  Service: Orthopedics;  Laterality: Left;   LOWER EXTREMITY ANGIOGRAM Left 01/21/2015   Procedure: LOWER EXTREMITY ANGIOGRAM;  Surgeon:  Lonni GORMAN Blade, MD;  Location: [facility] CATH LAB;  Service: Cardiovascular;  Laterality: Left;   PERIPHERAL VASCULAR BALLOON ANGIOPLASTY Right 06/15/2023   Procedure: PERIPHERAL VASCULAR BALLOON ANGIOPLASTY;  Surgeon: Serene Gaile ORN, MD;  Location: [facility] INVASIVE CV LAB;  Service: Cardiovascular;  Laterality: Right;  right AT    Social History:  reports that he has quit smoking. He has never used smokeless tobacco. He reports current alcohol use. He reports current drug use. Drug: Marijuana.   No Known Allergies  Family  History  Problem Relation Age of Onset   Hypertension Other     Family history reviewed and not pertinent    Prior to Admission medications   Medication Sig Start Date End Date Taking? Authorizing Provider  acetaminophen  (TYLENOL ) 325 MG tablet Take 1-2 tablets (325-650 mg total) by mouth every 4 (four) hours as needed for mild pain. 07/08/23   Setzer, Sandra J, PA-C  ascorbic acid  (VITAMIN C ) 1000 MG tablet Take 1 tablet (1,000 mg total) by mouth daily. 07/08/23   Setzer, Nena PARAS, PA-C  aspirin  EC 81 MG tablet Take 81 mg by mouth daily.    [provider]  atorvastatin  (LIPITOR ) 80 MG tablet Take 40 mg by mouth at bedtime.     [provider]  vitamin D3 (CHOLECALCIFEROL ) 25 MCG tablet Take 4 tablets (4,000 Units total) by mouth daily. 07/08/23   Setzer, Sandra J, PA-C  gabapentin  (NEURONTIN ) 100 MG capsule Take 1 capsule (100 mg total) by mouth 2 (two) times daily AND 2 capsules (200 mg total) at bedtime. 07/08/23   Setzer, Nena PARAS, PA-C  insulin  glargine (LANTUS  SOLOSTAR) 100 UNIT/ML Solostar Pen Inject 13 Units into the skin daily. 07/08/23   Setzer, Nena PARAS, PA-C  losartan  (COZAAR ) 25 MG tablet Take 0.5 tablets (12.5 mg total) by mouth daily. 07/09/23   Setzer, Sandra J, PA-C  metoCLOPramide  (REGLAN ) 5 MG tablet Take 1 tablet (5 mg total) by mouth 3 (three) times daily before meals. 07/08/23   Setzer, Sandra J, PA-C  mycophenolate  (CELLCEPT ) 250 MG capsule Take 1,000 mg by mouth 2 (two) times daily.    [provider]  oxyCODONE  (OXY IR/ROXICODONE ) 5 MG immediate release tablet Take 1 tablet (5 mg total) by mouth every 4 (four) hours as needed for severe pain. 07/08/23   Setzer, Nena PARAS, PA-C  pantoprazole  (PROTONIX ) 40 MG tablet Take 1 tablet (40 mg total) by mouth daily. 02/27/21   Odell Celinda Balo, MD  pantoprazole  (PROTONIX ) 40 MG tablet Take 1 tablet (40 mg total) by mouth 2 (two) times daily. 07/08/23   Setzer, Nena PARAS, PA-C  tacrolimus  (PROGRAF ) 5  MG capsule Take 5 mg by mouth 2 (two) times daily.    [provider]  traZODone  (DESYREL ) 50 MG tablet Take 0.5-1 tablets (25-50 mg total) by mouth at bedtime as needed for sleep. 07/08/23   Rosendo Nena PARAS, PA-C     Objective    Physical Exam: Vitals:   05/09/24 1600 05/09/24 1620 05/09/24 1700 05/09/24 1730  BP: (!) 197/79  (!) 184/74 (!) 161/75  Pulse: (!) 106  (!) 108 (!) 102  Resp: 20  17 (!) 23  Temp:  99.2 F (37.3 C)    TempSrc:  Oral    SpO2: 100%  100% 100%  Weight:        General: appears to be stated age; alert, oriented Skin: warm, dry, no rash Head:  AT/[place] Mouth:  Oral mucosa membranes appear dry, normal dentition Neck:  supple; trachea midline Heart: Mildly tachycardic, but regular; did not appreciate any M/R/G Lungs: CTAB, did not appreciate any wheezes, rales, or rhonchi Abdomen: + BS; soft, ND, NT Vascular: Bilateral BKA's noted Extremities: Bilateral BKA's noted ;     Labs on Admission: I have personally reviewed following labs and imaging studies  CBC: Recent Labs  Lab 05/09/24 1206  WBC 17.7*  HGB 12.3*  HCT 38.4*  MCV 84.2  PLT 142*   Basic Metabolic Panel: Recent Labs  Lab 05/09/24 1206  NA 136  K 3.8  CL 100  CO2 23  GLUCOSE 144*  BUN 20  CREATININE 1.41*  CALCIUM  9.7   GFR: CrCl cannot be calculated (Unknown ideal weight.). Liver Function Tests: Recent Labs  Lab 05/09/24 1206  AST 13*  ALT 15  ALKPHOS 84  BILITOT 1.2  PROT 7.2  ALBUMIN 3.9   Recent Labs  Lab 05/09/24 1206  LIPASE 21   No results for input(s): AMMONIA in the last 168 hours. Coagulation Profile: No results for input(s): INR, PROTIME in the last 168 hours. Cardiac Enzymes: No results for input(s): CKTOTAL, CKMB, CKMBINDEX, TROPONINI in the last 168 hours. BNP (last 3 results) No results for input(s): PROBNP in the last 8760 hours. HbA1C: No results for input(s): HGBA1C in the last 72 hours. CBG: No results for  input(s): GLUCAP in the last 168 hours. Lipid Profile: No results for input(s): CHOL, HDL, LDLCALC, TRIG, CHOLHDL, LDLDIRECT in the last 72 hours. Thyroid Function Tests: No results for input(s): TSH, T4TOTAL, FREET4, T3FREE, THYROIDAB in the last 72 hours. Anemia Panel: No results for input(s): VITAMINB12, FOLATE, FERRITIN, TIBC, IRON, RETICCTPCT in the last 72 hours. Urine analysis:    Component Value Date/Time   COLORURINE YELLOW 05/09/2024 1534   APPEARANCEUR HAZY (A) 05/09/2024 1534   LABSPEC 1.017 05/09/2024 1534   PHURINE 5.0 05/09/2024 1534   GLUCOSEU NEGATIVE 05/09/2024 1534   HGBUR NEGATIVE 05/09/2024 1534   BILIRUBINUR NEGATIVE 05/09/2024 1534   KETONESUR NEGATIVE 05/09/2024 1534   PROTEINUR >=300 (A) 05/09/2024 1534   UROBILINOGEN 1.0 09/29/2013 2339   NITRITE NEGATIVE 05/09/2024 1534   LEUKOCYTESUR NEGATIVE 05/09/2024 1534    Radiological Exams on Admission: CT Angio Chest PE W and/or Wo Contrast Addendum Date: 05/09/2024 ADDENDUM REPORT: 05/09/2024 18:47 ADDENDUM: These results were called by telephone at the time of interpretation on 05/09/2024 at 6:32 pm to provider ROCKY HAMILTON , who verbally acknowledged these results. Electronically Signed   By: Greig Pique M.D.   On: 05/09/2024 18:47   Result Date: 05/09/2024 CLINICAL DATA:  Tachycardia. EXAM: CT ANGIOGRAPHY CHEST WITH CONTRAST TECHNIQUE: Multidetector CT imaging of the chest was performed using the standard protocol during bolus administration of intravenous contrast. Multiplanar CT image reconstructions and MIPs were obtained to evaluate the vascular anatomy. RADIATION DOSE REDUCTION: This exam was performed according to the departmental dose-optimization program which includes automated exposure control, adjustment of the mA and/or kV according to patient size and/or use of iterative reconstruction technique. CONTRAST:  75mL OMNIPAQUE  IOHEXOL  350 MG/ML SOLN COMPARISON:  CT PE  protocol 08/10/2006 FINDINGS: Cardiovascular: There is pulmonary embolism within the anterior left upper lobe segmental pulmonary artery. No other pulmonary emboli are seen. Heart is mildly enlarged. Aorta is normal in size. There is no pericardial effusion. There are atherosclerotic calcifications of the aorta and coronary arteries. Mediastinum/Nodes: No enlarged mediastinal, hilar, or axillary lymph nodes. Thyroid gland, trachea, and esophagus demonstrate no significant findings. Lungs/Pleura: Mild emphysema present. There are bands of  atelectasis in the bilateral lower lobes. There is a 3 mm nodule in the right lower lobe image 6/73. There are calcified granulomas in the left upper lobe. There is no pleural effusion or pneumothorax. Upper Abdomen: There is bilateral renal atrophy. Musculoskeletal: No chest wall abnormality. No acute or significant osseous findings. Review of the MIP images confirms the above findings. IMPRESSION: 1. Left upper lobe segmental pulmonary embolism. Positive for acute PE with CTevidence of right heart strain (RV/LV Ratio = 1.1) consistent with at least submassive (intermediate risk) PE. The presence of right heart strain has been associated with an increased risk of morbidity and mortality. 2. Mild cardiomegaly. 3. 3 mm right solid pulmonary nodule. No follow-up needed if patient is low-risk.This recommendation follows the consensus statement: Guidelines for Management of Incidental Pulmonary Nodules Detected on CT Images: From the Fleischner Society 2017; Radiology 2017; 284:228-243. 4. Bilateral renal atrophy. Aortic Atherosclerosis (ICD10-I70.0). Electronically Signed: By: Greig Pique M.D. On: 05/09/2024 18:22   CT ABDOMEN PELVIS W CONTRAST Result Date: 05/09/2024 CLINICAL DATA:  Malaise for the past 3 days with decreased appetite and 1 episode of vomiting. Left kidney and pancreas transplant in 2009. EXAM: CT ABDOMEN AND PELVIS WITH CONTRAST TECHNIQUE: Multidetector CT  imaging of the abdomen and pelvis was performed using the standard protocol following bolus administration of intravenous contrast. RADIATION DOSE REDUCTION: This exam was performed according to the departmental dose-optimization program which includes automated exposure control, adjustment of the mA and/or kV according to patient size and/or use of iterative reconstruction technique. CONTRAST:  75mL OMNIPAQUE  IOHEXOL  350 MG/ML SOLN COMPARISON:  Chest CTA obtained at the same time. Abdomen and pelvis CT dated 02/22/2021. FINDINGS: Lower chest: See the chest CTA report. Hepatobiliary: No focal liver abnormality is seen. No gallstones, gallbladder wall thickening, or biliary dilatation. Pancreas: Unremarkable. No pancreatic ductal dilatation or surrounding inflammatory changes. Spleen: Normal in size without focal abnormality. Adrenals/Urinary Tract: Bilateral adrenal hyperplasia. Small bilateral native kidneys. Normal-appearing left pelvic and lower abdomen renal transplant with normal excretion of contrast progressing through the ureter and into the urinary bladder. The bladder has a normal appearance. Stomach/Bowel: Single mildly prominent fluid-filled loop of small bowel in the right pelvis with diameter of 2.4 cm. Unremarkable stomach and colon. The appendix is not visualized. No secondary signs of appendicitis. Vascular/Lymphatic: Atheromatous arterial calcifications without aneurysm. No enlarged lymph nodes. Reproductive: Mildly enlarged prostate gland protruding into the base of the urinary bladder. Other: Small left paraumbilical hernia containing fat and a single calcification. No abdominopelvic ascites. Musculoskeletal: Lumbar spine degenerative changes. IMPRESSION: 1. Single mildly prominent fluid-filled loop of small bowel in the right pelvis. This is nonspecific and most likely represents minimal focal ileus. 2. Normal-appearing left pelvic and lower abdomen renal transplant. 3. Mildly enlarged prostate  gland protruding into the base of the urinary bladder. 4. Small left paraumbilical hernia containing fat. Electronically Signed   By: Elspeth Bathe M.D.   On: 05/09/2024 18:37   DG Chest Port 1 View Result Date: 05/09/2024 CLINICAL DATA:  Weakness nausea vomiting diarrhea EXAM: PORTABLE CHEST 1 VIEW COMPARISON:  06/23/2023 FINDINGS: Minimal atelectasis or scar at the right base. No acute airspace disease, pleural effusion or pneumothorax. Stable cardiomediastinal silhouette. IMPRESSION: No active disease. Minimal atelectasis or scar at the right base. Electronically Signed   By: Luke Bun M.D.   On: 05/09/2024 15:09   CT Head Wo Contrast Result Date: 05/09/2024 CLINICAL DATA:  Headache and neuro deficit EXAM: CT HEAD WITHOUT CONTRAST TECHNIQUE: Contiguous  axial images were obtained from the base of the skull through the vertex without intravenous contrast. RADIATION DOSE REDUCTION: This exam was performed according to the departmental dose-optimization program which includes automated exposure control, adjustment of the mA and/or kV according to patient size and/or use of iterative reconstruction technique. COMPARISON:  CT 02/22/2021 and MRI 06/11/2023 FINDINGS: Brain: No intracranial hemorrhage, mass effect, or evidence of acute infarct. No hydrocephalus. No extra-axial fluid collection. Age-commensurate cerebral atrophy and chronic small vessel ischemic disease. Vascular: No hyperdense vessel. Intracranial arterial calcification. Skull: No fracture or focal lesion. Sinuses/Orbits: No acute finding. Other: None. IMPRESSION: No acute intracranial abnormality. Electronically Signed   By: Norman Gatlin M.D.   On: 05/09/2024 12:39      Assessment/Plan   Principal Problem:   Acute pulmonary embolism (HCC) Active Problems:   Anemia of chronic disease   DM2 (diabetes mellitus, type 2) (HCC)   Leukocytosis   History of renal transplant   SIRS (systemic inflammatory response syndrome) (HCC)    Generalized weakness   History of essential hypertension   Acquired hypothyroidism   GERD (gastroesophageal reflux disease)     #) Acute Pulmonary Embolism: In the context of presenting she has 3 days of generalized weakness associated with intermittent shortness of breath, with findings of tachycardia, tachypnea, elevated D-dimer, CTA chest with PE protocol showed evidence of left upper lobe segmental pulmonary embolism noted to be at least submassive in nature, as above, with CT evidence of right heart strain.  No evidence of hypoxia.  No evidence of associated hypotension to warrant consideration for tPA administration.  Appears to be patient's first PE/DVT.  No obvious provoking factors, as further detailed above, including no recent diminished ambulatory activity, with the patient continuing to remain active in spite of his bilateral BKA's, frequently using his bilateral leg prosthesis to ambulate on a daily basis.  Aside from daily baby aspirin , not on any additional blood thinners at home. Will continue the heparin  drip that was initiated in the emergency department this evening.  Presentation is not associate any chest pain, and EKG demonstrates no evidence of acute STEMI changes.  Given the at least submassive nature of the patient's pulmonary embolism, and associated increased risk for hypotension due to preload dependent pathophysiology, will initiate gentle IV fluids overnight for hemodynamic support.    Plan: Continue heparin  drip via inpatient pharmacy consultation. Monitor on telemetry. Monitor for development of hypotension.  Lactated Ringer 's at 59 cc/h x 10 hours.  Prn supplemental O2 in order to maintain oxygen saturations greater than or equal to 92%.  Incentive spirometry.   Recheck CBC in the morning.  Echocardiogram ordered for the morning.                     #) SIRS criteria present: Presentation associated with mildly elevated white blood cell count,  tachycardia, tachypnea.  However, in the absence of e/o underlying infection at this time, including, CTA chest which showed no evidence of infiltrate to suggest pneumonia, follow-up COVID, influenza, RSV PCR were all negative, urinalysis was inconsistent with UTI, and CT abdomen/pelvis showed no evidence of acute infectious process, criteria for sepsis not currently met.  suspect non-infectious factors contributing to these SIRS findings, including reactive elevation of white blood cell count in the setting of his presenting submassive acute pulmonary embolism, with suspected additional contribution towards leukocytosis on a hemoconcentrated basis given clinical evidence of mild dehydration.  Additionally, suspect that his mild tachycardia and tachypnea are also  consistent with his presenting acute pulmonary embolism.  While he is immunocompromise in the setting of chronic CellCept /tacrolimus  for his history of renal transplant, he appears patient appears hemodynamically stable at this time and without any evidence of underlying infection.  Consequently, will refrain from initiation of IV antibiotics at this time.  Blood cultures x 2 were collected today, and lactic acid was found to be nonelevated 1.3.  Plan: Repeat CBC with diff in the AM.  Monitor strict I's and O's and daily weights.  Monitor on telemetry. Refraining from IV abx for now, as above.  Check procalcitonin level.  IV fluids, as above.  Further evaluation management of presenting acute pulmonary embolism, as above.                     #) Generalized weakness: 2 to 3-day duration of generalized weakness, in the absence of any evidence of acute focal neurologic deficits, including no evidence of acute focal weakness to suggest acute CVA.  Suspect multifactorial contributions, including that from physiologic stress stemming from presenting acute left upper lobe pulmonary embolism that meets criteria for being at least submassive  in nature with CT evidence of right heart strain.  Potential additional contribution towards his generalized weakness stemming from clinical evidence of mild dehydration in the context of his recent intermittent episodes of nausea/vomiting, with evidence of hyaline casts on urinalysis consistent with a picture of mild dehydration.  No e/o additional infectious process at this time, as further detailed above. Will further eval for any additional contributions from endocrine/metabolic sources, as detailed below.   Plan: work-up and management of presenting acute pulmonary embolism, as described above. PT/OT consults ordered for the AM. Fall precautions. CMP/CBC in the AM. Check TSH, serum Mg level. Check CPK level in the context of outpatient use of high intensity atorvastatin .  Check B12 level, procalcitonin level.  Lactated Ringer 's, as above.                   #) History of renal transplant: No history of such, occurring in April 2016.  Chronically no suppressed on CellCept  and tacrolimus .  Renal function appears to be at baseline, as further quantified above.  Plan: Resume home CellCept  and tacrolimus .  Repeat CMP in the morning.  Monitor strict I's and O's and daily weights.                    #) Type 2 Diabetes Mellitus: documented history of such. Home insulin  regimen: Lantus  13 units SQ daily. Home oral hypoglycemic agents: None. presenting blood sugar: 144. Most recent A1c noted to be 11.1% when checked in September 2024.  His history of diabetes is complicated by peripheral polyneuropathy, for which he is on gabapentin  at home.  Plan: accuchecks QAC and HS with low dose SSI.  Now, we will hold next dose of basal insulin  and monitor insulin  fasting blood sugar to assist with determination of need for resumption of a portion of patient's 30 low-dose of basal insulin .  Add on hemoglobin A1c level.  Resume gabapentin .                     #)  Essential Hypertension: documented h/o such, with outpatient antihypertensive regimen including losartan .  SBP's in the ED today: 160s to 180s mmHg. given the increased risk for development of ensuing hypotension in the setting of the patient's presenting at least submassive acute pulmonary embolism, will hold home losartan  for now, closely  monitor ensuing blood pressure trend, as outlined above.  Plan: Close monitoring of subsequent BP via routine VS. holding losartan  for now, as above.  Prn IV hydralazine  for systolic blood pressure greater than 180 mmHg. monitor strict I's and O's and daily weights.  Monitor on telemetry.                       #) acquired hypothyroidism: documented h/o such, on Synthroid as outpatient.  In the setting of the patient's presenting complaint of generalized weakness, will also check TSH level.  Plan: cont home Synthroid.  Check TSH level.                       #) GERD: documented h/o such; on Protonix  on twice daily basis as outpatient.   Plan: continue home PPI.                    #) Anemia of chronic disease: Documented history of such, a/w with baseline hgb range 8-12.5, with presenting hgb consistent with this range, in the absence of any overt evidence of active bleed.     Plan: Repeat CBC in the morning.        DVT prophylaxis: Heparin  drip in the setting of acute pulmonary embolism, as above Code Status: Full code Family Communication: none Disposition Plan: Per Rounding Team Consults called: none;  Admission status: Observation     I SPENT GREATER THAN 75  MINUTES IN CLINICAL CARE TIME/MEDICAL DECISION-MAKING IN COMPLETING THIS ADMISSION.      Vuong Musa B Lucerito Rosinski DO [facility]  From 7PM - 7AM   05/09/2024, 7:33 PM

## 2024-05-09 NOTE — ED Notes (Signed)
 Pt's BP has been creeping up.  Pt reports he has not taken his medication today.

## 2024-05-09 NOTE — ED Notes (Signed)
 Unsuccessful IV attempt x2.  Will consult IV team.

## 2024-05-09 NOTE — Progress Notes (Signed)
 PHARMACY - ANTICOAGULATION CONSULT NOTE  Pharmacy Consult for heparin  Indication: pulmonary embolus  No Known Allergies  Patient Measurements: Weight: 72.6 kg (160 lb)  Vital Signs: Temp: 99.2 F (37.3 C) (08/12 1620) Temp Source: Oral (08/12 1620) BP: 161/75 (08/12 1730) Pulse Rate: 102 (08/12 1730)  Labs: Recent Labs    05/09/24 1206  HGB 12.3*  HCT 38.4*  PLT 142*  CREATININE 1.41*    CrCl cannot be calculated (Unknown ideal weight.).   Medical History: Past Medical History:  Diagnosis Date   Anxiety    COVID-19 virus infection 09/2020   CPAP (continuous positive airway pressure) dependence    Diabetes mellitus without complication (HCC)    GERD (gastroesophageal reflux disease)    Hypertension    Legally blind in left eye, as defined in [place]     PTSD (post-traumatic stress disorder)    PVD (peripheral vascular disease) (HCC) 01/17/2015   Renal disorder    Sleep apnea    Thyroid disease      Assessment: 89 YOM with CT angio chest positive for PE with RHS, he is not on anticoagulation PTA  Goal of Therapy:  Heparin  level 0.3-0.7 units/ml Monitor platelets by anticoagulation protocol: Yes   Plan:  Heparin  4000 units IV x 1, and gtt at 1250 units/hr F/u 8 hour heparin  level F/u long term [facility] plan  Dorn Poot, PharmD, [facility] Clinical Pharmacist ED Pharmacist Phone # [phone] 05/09/2024 6:56 PM

## 2024-05-09 NOTE — ED Provider Notes (Signed)
 [place] EMERGENCY DEPARTMENT AT [facility] Provider Note   CSN: [id number] Arrival date & time: 05/09/24  1010     Patient presents with: Emesis   [name]. is a 73 y.o. male.   [name]. is a 73 y.o. male w/ PMH of T2DM, HTN, GERD, TIA, Renal Transplant presenting today for CC of not feeling good x 3 days. Patient endorses significant weakness and fatigue starting Saturday, as well as vomiting x1 Sunday and x1 this AM. Decreased appetite - last meal was Sunday, consumed a little watermelon last night. Reports adequate hydration. No hematemesis, no diarrhea or constipation, no blood in stool. Patient also reports one episode of transient left sided chest pain w/ SOB at rest on Sunday that has since resolved. Dizziness over the past 3 days that became significantly more severe this AM w/ associated blurry vision. Denies changes in sleep, exposures to illnesses, any recent travel, or medication changes. Denies LOC, HA, numbness, fever.The patient has a history of T2DM which he takes insulin  for; patient reports not being med compliant this past week d/t a few nights I fell asleep early. He last checked his blood glucose on Sunday and it was in the 150s.   Emesis      Prior to Admission medications   Medication Sig Start Date End Date Taking? Authorizing Provider  acetaminophen  (TYLENOL ) 325 MG tablet Take 1-2 tablets (325-650 mg total) by mouth every 4 (four) hours as needed for mild pain. 07/08/23   Setzer, Sandra J, PA-C  ascorbic acid  (VITAMIN C ) 1000 MG tablet Take 1 tablet (1,000 mg total) by mouth daily. 07/08/23   Setzer, Nena PARAS, PA-C  aspirin  EC 81 MG tablet Take 81 mg by mouth daily.    [provider]  atorvastatin  (LIPITOR ) 80 MG tablet Take 40 mg by mouth at bedtime.     [provider]  vitamin D3 (CHOLECALCIFEROL ) 25 MCG tablet Take 4 tablets (4,000 Units total) by mouth daily. 07/08/23   Setzer, Nena PARAS, PA-C   gabapentin  (NEURONTIN ) 100 MG capsule Take 1 capsule (100 mg total) by mouth 2 (two) times daily AND 2 capsules (200 mg total) at bedtime. 07/08/23   Setzer, Nena PARAS, PA-C  insulin  glargine (LANTUS  SOLOSTAR) 100 UNIT/ML Solostar Pen Inject 13 Units into the skin daily. 07/08/23   Setzer, Nena PARAS, PA-C  losartan  (COZAAR ) 25 MG tablet Take 0.5 tablets (12.5 mg total) by mouth daily. 07/09/23   Setzer, Sandra J, PA-C  metoCLOPramide  (REGLAN ) 5 MG tablet Take 1 tablet (5 mg total) by mouth 3 (three) times daily before meals. 07/08/23   Setzer, Sandra J, PA-C  mycophenolate  (CELLCEPT ) 250 MG capsule Take 1,000 mg by mouth 2 (two) times daily.    [provider]  oxyCODONE  (OXY IR/ROXICODONE ) 5 MG immediate release tablet Take 1 tablet (5 mg total) by mouth every 4 (four) hours as needed for severe pain. 07/08/23   Setzer, Nena PARAS, PA-C  pantoprazole  (PROTONIX ) 40 MG tablet Take 1 tablet (40 mg total) by mouth daily. 02/27/21   Odell Celinda Balo, MD  pantoprazole  (PROTONIX ) 40 MG tablet Take 1 tablet (40 mg total) by mouth 2 (two) times daily. 07/08/23   Setzer, Nena PARAS, PA-C  tacrolimus  (PROGRAF ) 5 MG capsule Take 5 mg by mouth 2 (two) times daily.    [provider]  traZODone  (DESYREL ) 50 MG tablet Take 0.5-1 tablets (25-50 mg total) by mouth at bedtime as needed for sleep. 07/08/23  Setzer, Sandra J, PA-C    Allergies: Patient has no known allergies.    Review of Systems  Gastrointestinal:  Positive for vomiting.  Neurological:  Positive for weakness.  All other systems reviewed and are negative.   Updated Vital Signs BP (!) 176/73   Pulse (!) 105   Temp 99.5 F (37.5 C) (Oral)   Resp (!) 22   Wt 72.6 kg   SpO2 100%   BMI 35.87 kg/m   Physical Exam Vitals and nursing note reviewed.  Constitutional:      Appearance: He is well-developed.  HENT:     Head: Normocephalic.  Eyes:     Extraocular Movements: Extraocular movements intact.     Pupils: Pupils are  equal, round, and reactive to light.  Cardiovascular:     Rate and Rhythm: Normal rate.  Pulmonary:     Effort: Pulmonary effort is normal.  Abdominal:     General: Bowel sounds are normal. There is no distension.     Palpations: Abdomen is soft.  Musculoskeletal:     Cervical back: Normal range of motion.     Comments: Bilat ampute  Skin:    General: Skin is warm.  Neurological:     General: No focal deficit present.     Mental Status: He is alert and oriented to person, place, and time.  Psychiatric:        Mood and Affect: Mood normal.     (all labs ordered are listed, but only abnormal results are displayed) Labs Reviewed  COMPREHENSIVE METABOLIC PANEL WITH GFR - Abnormal; Notable for the following components:      Result Value   Glucose, Bld 144 (*)    Creatinine, Ser 1.41 (*)    AST 13 (*)    GFR, Estimated 53 (*)    All other components within normal limits  CBC - Abnormal; Notable for the following components:   WBC 17.7 (*)    Hemoglobin 12.3 (*)    HCT 38.4 (*)    Platelets 142 (*)    All other components within normal limits  RESP PANEL BY RT-PCR (RSV, FLU A&B, COVID)  [id number]  LIPASE, BLOOD  URINALYSIS, ROUTINE W REFLEX MICROSCOPIC    EKG: EKG Interpretation Date/Time:  Tuesday May 09 2024 11:00:47 EDT Ventricular Rate:  95 PR Interval:  234 QRS Duration:  88 QT Interval:  340 QTC Calculation: 428 R Axis:   52  Text Interpretation: Sinus rhythm Prolonged PR interval Nonspecific T wave abnormality Confirmed by Bernard Drivers ([id number]) on 05/09/2024 11:11:14 AM  Radiology: [facility] Chest Port 1 View Result Date: 05/09/2024 CLINICAL DATA:  Weakness nausea vomiting diarrhea EXAM: PORTABLE CHEST 1 VIEW COMPARISON:  06/23/2023 FINDINGS: Minimal atelectasis or scar at the right base. No acute airspace disease, pleural effusion or pneumothorax. Stable cardiomediastinal silhouette. IMPRESSION: No active disease. Minimal atelectasis or scar at the right base.  Electronically Signed   By: Luke Bun M.D.   On: 05/09/2024 15:09   CT Head Wo Contrast Result Date: 05/09/2024 CLINICAL DATA:  Headache and neuro deficit EXAM: CT HEAD WITHOUT CONTRAST TECHNIQUE: Contiguous axial images were obtained from the base of the skull through the vertex without intravenous contrast. RADIATION DOSE REDUCTION: This exam was performed according to the departmental dose-optimization program which includes automated exposure control, adjustment of the mA and/or kV according to patient size and/or use of iterative reconstruction technique. COMPARISON:  CT 02/22/2021 and MRI 06/11/2023 FINDINGS: Brain: No intracranial hemorrhage, mass effect, or evidence of  acute infarct. No hydrocephalus. No extra-axial fluid collection. Age-commensurate cerebral atrophy and chronic small vessel ischemic disease. Vascular: No hyperdense vessel. Intracranial arterial calcification. Skull: No fracture or focal lesion. Sinuses/Orbits: No acute finding. Other: None. IMPRESSION: No acute intracranial abnormality. Electronically Signed   By: Norman Gatlin M.D.   On: 05/09/2024 12:39     Procedures   Medications Ordered in the ED - No data to display                                  Medical Decision Making Pt complains of nausea and weakness.   Amount and/or Complexity of Data Reviewed Labs: ordered. Decision-making details documented in ED Course.    Details: Labs ordered reviewed and interpreted  Radiology: ordered.        Final diagnoses:  Weakness  Nausea and vomiting, unspecified vomiting type    ED Discharge Orders     None     Pt's care turned over to PA [facility] [place] 05/09/24 1525    Bernard Drivers, MD 05/10/24 [phone]

## 2024-05-09 NOTE — ED Notes (Addendum)
 This RN was only to obtain 1 set of cultures from an US  IV line.  EDP made aware.

## 2024-05-09 NOTE — ED Notes (Signed)
 Patient transported to CT

## 2024-05-09 NOTE — ED Notes (Signed)
 ED Provider at bedside.

## 2024-05-09 NOTE — Progress Notes (Signed)
 Hospitalist Progress Note:  I was contacted by EDP, Dr. Ruthe, who conveyed that the radiology department reached out to him regarding an overread of the CT abd/pelvis w/o contrast completed earlier today in the ED. Relative to the original radiology read for the CT abdomen/pelvis with contrast, which was notable for a single mildly prominent fluid-filled loop of small bowel in the right pelvis, without any associated evidence of obstruction, abscess, or perforation, the overread conveyed the following:   there is a new area of soft tissue stranding in the mesenteric fat in an area of postsurgical clips and staples in the anterior mid abdomen to the right of midline. There is also what appears to be two blind ends of bowel at that location with possible pneumatosis in the walls, suspicious for ischemia. Alternatively, this could represent an area of contained perforation with associated extraluminal and mural gas.  Subsequently, Dr. Ruthe discussed pt's case and imaging with on-call general surgery, Dr. Rubin. Following review of the imaging/exam, Dr. Rubin felt that the findings on today's CT abd/pelvis were consistent with normal postoperative changes without any evidence of acute intra-abdominal or acute intrapelvic process. He felt that this correlated with the absence of epigastric pain on exam as well as the absence of fever. Overall, no acute findings on CT abd/pelvis and a reassuring physical exam. If patient subsequently develops epigastric pain or fever, then Dr. Rubin recommends repeating the CT abd/pelvis and reaching back out to general surgery at that point for formal consult. Otherwise, general surgery is available, as needed, if additional assistance is needed.   Will set diet as clear liquid, with instructions to advance, as tolerate, to full regular diet. Will also recheck a lactic acid level and continue to monitor for development of abd pain/fever, as above.      Eva Pore, DO Hospitalist

## 2024-05-09 NOTE — ED Triage Notes (Signed)
 Per EMS, Pt, from home, c/o n/v/d, weakness, and body aches x1 day.  Pain score 5/10.  Pt has not taken anything for symptoms.

## 2024-05-09 NOTE — ED Notes (Signed)
 Pt has not vomited or complained of nausea since arrival.  Pt has been aware we need a urine sample, but has not been able to provide a sample.

## 2024-05-10 DIAGNOSIS — D84821 Immunodeficiency due to drugs: Secondary | ICD-10-CM | POA: Diagnosis present

## 2024-05-10 DIAGNOSIS — E1165 Type 2 diabetes mellitus with hyperglycemia: Secondary | ICD-10-CM | POA: Diagnosis present

## 2024-05-10 DIAGNOSIS — Z7984 Long term (current) use of oral hypoglycemic drugs: Secondary | ICD-10-CM | POA: Diagnosis not present

## 2024-05-10 DIAGNOSIS — E1151 Type 2 diabetes mellitus with diabetic peripheral angiopathy without gangrene: Secondary | ICD-10-CM | POA: Diagnosis present

## 2024-05-10 DIAGNOSIS — Z89512 Acquired absence of left leg below knee: Secondary | ICD-10-CM | POA: Diagnosis not present

## 2024-05-10 DIAGNOSIS — R531 Weakness: Secondary | ICD-10-CM | POA: Diagnosis present

## 2024-05-10 DIAGNOSIS — E1142 Type 2 diabetes mellitus with diabetic polyneuropathy: Secondary | ICD-10-CM | POA: Diagnosis present

## 2024-05-10 DIAGNOSIS — H548 Legal blindness, as defined in USA: Secondary | ICD-10-CM | POA: Diagnosis present

## 2024-05-10 DIAGNOSIS — E039 Hypothyroidism, unspecified: Secondary | ICD-10-CM | POA: Diagnosis present

## 2024-05-10 DIAGNOSIS — I10 Essential (primary) hypertension: Secondary | ICD-10-CM | POA: Diagnosis present

## 2024-05-10 DIAGNOSIS — Z1152 Encounter for screening for COVID-19: Secondary | ICD-10-CM | POA: Diagnosis not present

## 2024-05-10 DIAGNOSIS — E860 Dehydration: Secondary | ICD-10-CM | POA: Diagnosis present

## 2024-05-10 DIAGNOSIS — E66812 Obesity, class 2: Secondary | ICD-10-CM | POA: Diagnosis present

## 2024-05-10 DIAGNOSIS — Z89511 Acquired absence of right leg below knee: Secondary | ICD-10-CM | POA: Diagnosis not present

## 2024-05-10 DIAGNOSIS — Z79621 Long term (current) use of calcineurin inhibitor: Secondary | ICD-10-CM | POA: Diagnosis not present

## 2024-05-10 DIAGNOSIS — D72829 Elevated white blood cell count, unspecified: Secondary | ICD-10-CM | POA: Diagnosis present

## 2024-05-10 DIAGNOSIS — I2699 Other pulmonary embolism without acute cor pulmonale: Secondary | ICD-10-CM | POA: Diagnosis present

## 2024-05-10 DIAGNOSIS — Z794 Long term (current) use of insulin: Secondary | ICD-10-CM | POA: Diagnosis not present

## 2024-05-10 DIAGNOSIS — Z9483 Pancreas transplant status: Secondary | ICD-10-CM | POA: Diagnosis not present

## 2024-05-10 DIAGNOSIS — E785 Hyperlipidemia, unspecified: Secondary | ICD-10-CM | POA: Diagnosis present

## 2024-05-10 DIAGNOSIS — R6510 Systemic inflammatory response syndrome (SIRS) of non-infectious origin without acute organ dysfunction: Secondary | ICD-10-CM | POA: Diagnosis present

## 2024-05-10 DIAGNOSIS — D638 Anemia in other chronic diseases classified elsewhere: Secondary | ICD-10-CM | POA: Diagnosis present

## 2024-05-10 DIAGNOSIS — Z940 Kidney transplant status: Secondary | ICD-10-CM | POA: Diagnosis not present

## 2024-05-10 DIAGNOSIS — K219 Gastro-esophageal reflux disease without esophagitis: Secondary | ICD-10-CM | POA: Diagnosis present

## 2024-05-10 DIAGNOSIS — I2609 Other pulmonary embolism with acute cor pulmonale: Secondary | ICD-10-CM | POA: Diagnosis not present

## 2024-05-10 DIAGNOSIS — Z8616 Personal history of COVID-19: Secondary | ICD-10-CM | POA: Diagnosis not present

## 2024-05-10 LAB — CBC WITH DIFFERENTIAL/PLATELET
Abs Immature Granulocytes: 0.12 K/uL — ABNORMAL HIGH (ref 0.00–0.07)
Basophils Absolute: 0 K/uL (ref 0.0–0.1)
Basophils Relative: 0 %
Eosinophils Absolute: 0 K/uL (ref 0.0–0.5)
Eosinophils Relative: 0 %
HCT: 34.1 % — ABNORMAL LOW (ref 39.0–52.0)
Hemoglobin: 11.1 g/dL — ABNORMAL LOW (ref 13.0–17.0)
Immature Granulocytes: 1 %
Lymphocytes Relative: 10 %
Lymphs Abs: 1.6 K/uL (ref 0.7–4.0)
MCH: 27.3 pg (ref 26.0–34.0)
MCHC: 32.6 g/dL (ref 30.0–36.0)
MCV: 83.8 fL (ref 80.0–100.0)
Monocytes Absolute: 1.6 K/uL — ABNORMAL HIGH (ref 0.1–1.0)
Monocytes Relative: 10 %
Neutro Abs: 13.4 K/uL — ABNORMAL HIGH (ref 1.7–7.7)
Neutrophils Relative %: 79 %
Platelets: 118 K/uL — ABNORMAL LOW (ref 150–400)
RBC: 4.07 MIL/uL — ABNORMAL LOW (ref 4.22–5.81)
RDW: 14.7 % (ref 11.5–15.5)
WBC: 16.8 K/uL — ABNORMAL HIGH (ref 4.0–10.5)
nRBC: 0 % (ref 0.0–0.2)

## 2024-05-10 LAB — COMPREHENSIVE METABOLIC PANEL WITH GFR
ALT: 13 U/L (ref 0–44)
AST: 11 U/L — ABNORMAL LOW (ref 15–41)
Albumin: 3.3 g/dL — ABNORMAL LOW (ref 3.5–5.0)
Alkaline Phosphatase: 69 U/L (ref 38–126)
Anion gap: 11 (ref 5–15)
BUN: 17 mg/dL (ref 8–23)
CO2: 21 mmol/L — ABNORMAL LOW (ref 22–32)
Calcium: 9 mg/dL (ref 8.9–10.3)
Chloride: 107 mmol/L (ref 98–111)
Creatinine, Ser: 1.35 mg/dL — ABNORMAL HIGH (ref 0.61–1.24)
GFR, Estimated: 56 mL/min — ABNORMAL LOW (ref >60)
Glucose, Bld: 100 mg/dL — ABNORMAL HIGH (ref 70–99)
Potassium: 3.9 mmol/L (ref 3.5–5.1)
Sodium: 139 mmol/L (ref 135–145)
Total Bilirubin: 1.3 mg/dL — ABNORMAL HIGH (ref 0.0–1.2)
Total Protein: 6.5 g/dL (ref 6.5–8.1)

## 2024-05-10 LAB — HEPARIN LEVEL (UNFRACTIONATED)
Heparin Unfractionated: 0.34 [IU]/mL (ref 0.30–0.70)
Heparin Unfractionated: 0.4 [IU]/mL (ref 0.30–0.70)

## 2024-05-10 LAB — MAGNESIUM: Magnesium: 1.9 mg/dL (ref 1.7–2.4)

## 2024-05-10 LAB — GLUCOSE, CAPILLARY
Glucose-Capillary: 135 mg/dL — ABNORMAL HIGH (ref 70–99)
Glucose-Capillary: 100 mg/dL — ABNORMAL HIGH (ref 70–99)
Glucose-Capillary: 184 mg/dL — ABNORMAL HIGH (ref 70–99)
Glucose-Capillary: 173 mg/dL — ABNORMAL HIGH (ref 70–99)
Glucose-Capillary: 172 mg/dL — ABNORMAL HIGH (ref 70–99)

## 2024-05-10 LAB — PHOSPHORUS: Phosphorus: 2.2 mg/dL — ABNORMAL LOW (ref 2.5–4.6)

## 2024-05-10 LAB — HEMOGLOBIN A1C
Hgb A1c MFr Bld: 7.1 % — ABNORMAL HIGH (ref 4.8–5.6)
Mean Plasma Glucose: 157 mg/dL

## 2024-05-10 LAB — VITAMIN B12: Vitamin B-12: 334 pg/mL (ref 180–914)

## 2024-05-10 MED ORDER — TACROLIMUS 1 MG PO CAPS
4.0000 mg | ORAL_CAPSULE | Freq: Every day | ORAL | Status: DC
  Administered 2024-05-10 – 2024-05-12 (×4): 4 mg via ORAL
  Filled 2024-05-10 (×4): qty 4

## 2024-05-10 MED ORDER — TACROLIMUS 1 MG PO CAPS
5.0000 mg | ORAL_CAPSULE | Freq: Every day | ORAL | Status: DC
  Administered 2024-05-11 – 2024-05-13 (×3): 5 mg via ORAL
  Filled 2024-05-10 (×3): qty 5

## 2024-05-10 MED ORDER — LOSARTAN POTASSIUM 25 MG PO TABS
25.0000 mg | ORAL_TABLET | Freq: Every day | ORAL | Status: DC
  Administered 2024-05-10 – 2024-05-13 (×5): 25 mg via ORAL
  Filled 2024-05-10 (×4): qty 1

## 2024-05-10 MED ORDER — AMLODIPINE BESYLATE 10 MG PO TABS
10.0000 mg | ORAL_TABLET | Freq: Every day | ORAL | Status: DC
  Administered 2024-05-10 – 2024-05-13 (×5): 10 mg via ORAL
  Filled 2024-05-10 (×4): qty 1

## 2024-05-10 NOTE — Progress Notes (Signed)
 PHARMACY - ANTICOAGULATION CONSULT NOTE  Pharmacy Consult for heparin  Indication: pulmonary embolus  No Known Allergies  Patient Measurements: Weight: 71.2 kg (156 lb 15.5 oz)  Vital Signs: Temp: 101.5 F (38.6 C) (08/13 0016) Temp Source: Oral (08/13 0016) BP: 144/70 (08/13 0016) Pulse Rate: 93 (08/13 0016)  Labs: Recent Labs    05/09/24 1206 05/10/24 0237  HGB 12.3* 11.1*  HCT 38.4* 34.1*  PLT 142* 118*  HEPARINUNFRC  --  0.34  CREATININE 1.41*  --   CKTOTAL 37*  --     CrCl cannot be calculated (Unknown ideal weight.).   Medical History: Past Medical History:  Diagnosis Date   Anxiety    COVID-19 virus infection 09/2020   CPAP (continuous positive airway pressure) dependence    Diabetes mellitus without complication (HCC)    GERD (gastroesophageal reflux disease)    Hypertension    Legally blind in left eye, as defined in [place]     PTSD (post-traumatic stress disorder)    PVD (peripheral vascular disease) (HCC) 01/17/2015   Renal disorder    Renal transplant recipient    S/P bilateral BKA (below knee amputation) (HCC)    Sleep apnea    Thyroid disease      Assessment: 48 YOM with CT angio chest positive for PE with RHS, he is not on anticoagulation PTA  AM: heparin  level at goal on 1250 units/hr. Per RN, no signs/symptoms of bleeding. Given on ower end of therpaeutic and indication will increase rate to stay at goal  Goal of Therapy:  Heparin  level 0.3-0.7 units/ml Monitor platelets by anticoagulation protocol: Yes   Plan:  Increase heparin  gtt to 1400 units/hr F/u 8 hour confirmatory heparin  level F/u long term AC plan  Lynwood Poplar, PharmD, BCPS Clinical Pharmacist 05/10/2024 3:47 AM

## 2024-05-10 NOTE — Progress Notes (Signed)
 OT Cancellation Note  Patient Details Name: [name]. MRN: [id number] DOB: 18-Aug-1951   Cancelled Treatment:    Reason Eval/Treat Not Completed: Medical issues which prohibited therapy. Pt admitted with an acute PE and heparin  was initiated at 19:40 last evening. Per PT protocol, we hold off on mobilizing pts for the first x24 hours after heparin  is initiated for an acute PE. Coordinated with MD and confirmed PT can hold off until tomorrow. Will plan to follow-up tomorrow as able.   Thanvi Blincoe D Walton, OTD, OTR/L [facility] Office: [phone]   [name] 05/10/2024, 11:40 AM

## 2024-05-10 NOTE — Progress Notes (Signed)
 PROGRESS NOTE  [name]. FMW:[id number] DOB: 07-25-1951 DOA: 05/09/2024 PCP: Stephane Leita DEL, MD   LOS: 0 days   Brief Narrative / Interim history: 73 year old male with history of renal and pancreatic transplant in 2016, on immunosuppressive therapy, bilateral BKA's, DM 2, anemia of chronic disease who has been admitted to the hospital with acute PE.  He presented with generalized weakness, mild shortness of breath.  He was placed on heparin  and admitted to the hospital  Subjective / 24h Interval events: He states that he is doing well.  He denies any abdominal pain, had some nausea yesterday but nothing today.  No diarrhea.  Denies any chest discomfort  Assesement and Plan: Principal Problem:   Acute pulmonary embolism (HCC) Active Problems:   Anemia of chronic disease   DM2 (diabetes mellitus, type 2) (HCC)   Leukocytosis   History of renal transplant   SIRS (systemic inflammatory response syndrome) (HCC)   Generalized weakness   History of essential hypertension   Acquired hypothyroidism   GERD (gastroesophageal reflux disease)  Principal problem Acute PE -CT angiogram on admission showed left upper lobe segmental pulmonary embolism at least submassive in nature, with CT evidence of right heart strain.  Fortunately he is not hypoxic, nor hypotensive. - Continue heparin  infusion - 2D echocardiogram pending  Active problems Elevated WBC, fever-this could be due to his PE.  CT scan on admission does mention some soft tissue stranding in the mesenteric fat and an area of postsurgical clips and staples.  Case was discussed overnight by EDP with Dr. Rubin who felt that the findings were consistent with normal postoperative changes without any evidence of acute intra-abdominal or intrapelvic processes.  He did have hernia repair in 2023.  He has no abdominal pain - Will monitor white count and how he does clinically  Renal/pancreas transplant-continue CellCept ,  tacrolimus   Hyperlipidemia-continue statin  Hypertension-resume home amlodipine , losartan   DM 2, poorly controlled, with hyperglycemia-continue insulin   Lab Results  Component Value Date   HGBA1C 11.1 (H) 06/07/2023   CBG (last 3)  Recent Labs    05/09/24 2120 05/10/24 0606 05/10/24 1123  GLUCAP 135* 100* 184*    Scheduled Meds:  aspirin  EC  81 mg Oral Daily   atorvastatin   40 mg Oral QHS   gabapentin   100 mg Oral BID   And   gabapentin   200 mg Oral QHS   insulin  aspart  0-5 Units Subcutaneous QHS   insulin  aspart  0-9 Units Subcutaneous TID WC   mycophenolate   1,000 mg Oral BID   pantoprazole   40 mg Oral BID   tacrolimus   5 mg Oral BID   Continuous Infusions:  heparin  1,400 Units/hr (05/10/24 1138)   PRN Meds:.acetaminophen  **OR** acetaminophen , hydrALAZINE , melatonin, ondansetron  (ZOFRAN ) IV  Current Outpatient Medications  Medication Instructions   amLODipine  (NORVASC ) 10 mg, Oral, Daily   aspirin  EC 81 mg, Daily   atorvastatin  (LIPITOR ) 40 mg, Daily at bedtime   carboxymethylcellulose (REFRESH PLUS) 0.5 % SOLN 1 drop, Right Eye, See admin instructions, Apply 1 drop into the right eye every morning up to twice daily as needed for dry or irritated eyes.    dorzolamide-timolol (COSOPT) 2-0.5 % ophthalmic solution 1 drop, Left Eye, 2 times daily   ferrous sulfate 325 mg, Oral, Every M-W-F   Lantus  SoloStar 13 Units, Subcutaneous, Daily   losartan  (COZAAR ) 50 mg, Oral, Daily   metFORMIN  (GLUCOPHAGE -XR) 500 mg, Oral, 2 times daily   mycophenolate  (CELLCEPT ) 1,000 mg, Oral, 2 times  daily   tacrolimus  (PROGRAF ) 5 mg, Daily at bedtime   tacrolimus  (PROGRAF ) 4 mg, Oral, Daily   vitamin C  (ASCORBIC ACID ) 250 mg, Oral, Every M-W-F    Diet Orders (From admission, onward)     Start     Ordered   05/09/24 2113  Diet clear liquid Room service appropriate? Yes; Fluid consistency: Thin  Diet effective now       Comments: Advance as tolerated to full regular diet.   Question Answer Comment  Room service appropriate? Yes   Fluid consistency: Thin      05/09/24 2112            DVT prophylaxis:    Lab Results  Component Value Date   PLT 118 (L) 05/10/2024     Code Status: Full Code  Family Communication: no family at bedside   Status is: Inpatient Remains inpatient appropriate because: severity of illness  Level of care: Progressive  Consultants:  none  Objective: Vitals:   05/10/24 0916 05/10/24 1105 05/10/24 1125 05/10/24 1128  BP: (!) 173/71 (!) 168/67 (!) 186/75   Pulse: 98 100 (!) 101 100  Resp: 18 (!) 23 (!) 22 (!) 22  Temp: 98.5 F (36.9 C) 99.3 F (37.4 C) 99.6 F (37.6 C)   TempSrc: Oral Oral Oral   SpO2:  97% 97% 98%  Weight:        Intake/Output Summary (Last 24 hours) at 05/10/2024 1258 Last data filed at 05/10/2024 1019 Gross per 24 hour  Intake 120 ml  Output 500 ml  Net -380 ml   Wt Readings from Last 3 Encounters:  05/10/24 71.8 kg  06/29/23 67.5 kg  06/29/23 72.1 kg    Examination:  Constitutional: NAD Eyes: no scleral icterus ENMT: Mucous membranes are moist.  Neck: normal, supple Respiratory: clear to auscultation bilaterally, no wheezing, no crackles. Normal respiratory effort. Cardiovascular: Regular rate and rhythm, no murmurs / rubs / gallops.  Abdomen: non distended, no tenderness. Bowel sounds positive.  Musculoskeletal: no clubbing / cyanosis.    Data Reviewed: I have independently reviewed following labs and imaging studies   CBC Recent Labs  Lab 05/09/24 1206 05/10/24 0237  WBC 17.7* 16.8*  HGB 12.3* 11.1*  HCT 38.4* 34.1*  PLT 142* 118*  MCV 84.2 83.8  MCH 27.0 27.3  MCHC 32.0 32.6  RDW 15.0 14.7  LYMPHSABS  --  1.6  MONOABS  --  1.6*  EOSABS  --  0.0  BASOSABS  --  0.0    Recent Labs  Lab 05/09/24 1206 05/09/24 1543 05/09/24 1701 05/09/24 2258 05/10/24 0237  NA 136  --   --   --  139  K 3.8  --   --   --  3.9  CL 100  --   --   --  107  CO2 23  --    --   --  21*  GLUCOSE 144*  --   --   --  100*  BUN 20  --   --   --  17  CREATININE 1.41*  --   --   --  1.35*  CALCIUM  9.7  --   --   --  9.0  AST 13*  --   --   --  11*  ALT 15  --   --   --  13  ALKPHOS 84  --   --   --  69  BILITOT 1.2  --   --   --  1.3*  ALBUMIN 3.9  --   --   --  3.3*  MG 1.9  --   --   --  1.9  DDIMER  --   --  1.15*  --   --   PROCALCITON 0.33  --   --   --   --   LATICACIDVEN  --  1.3  --  1.3  --   TSH 1.617  --   --   --   --     ------------------------------------------------------------------------------------------------------------------ No results for input(s): CHOL, HDL, LDLCALC, TRIG, CHOLHDL, LDLDIRECT in the last 72 hours.  Lab Results  Component Value Date   HGBA1C 11.1 (H) 06/07/2023   ------------------------------------------------------------------------------------------------------------------ Recent Labs    05/09/24 1206  TSH 1.617    Cardiac Enzymes No results for input(s): CKMB, TROPONINI, MYOGLOBIN in the last 168 hours.  Invalid input(s): CK ------------------------------------------------------------------------------------------------------------------    Component Value Date/Time   BNP 24.0 03/14/2015 1800    CBG: Recent Labs  Lab 05/09/24 2120 05/10/24 0606 05/10/24 1123  GLUCAP 135* 100* 184*    Recent Results (from the past 240 hours)  Resp panel by RT-PCR (RSV, Flu A&B, Covid) Anterior Nasal Swab     Status: None   Collection Time: 05/09/24  2:07 PM   Specimen: Anterior Nasal Swab  Result Value Ref Range Status   SARS Coronavirus 2 by RT PCR NEGATIVE NEGATIVE Final   Influenza A by PCR NEGATIVE NEGATIVE Final   Influenza B by PCR NEGATIVE NEGATIVE Final    Comment: (NOTE) The Xpert Xpress SARS-CoV-2/FLU/RSV plus assay is intended as an aid in the diagnosis of influenza from Nasopharyngeal swab specimens and should not be used as a sole basis for treatment. Nasal washings  and aspirates are unacceptable for Xpert Xpress SARS-CoV-2/FLU/RSV testing.  Fact Sheet for Patients: BloggerCourse.com  Fact Sheet for Healthcare Providers: SeriousBroker.it  This test is not yet approved or cleared by the [place]  FDA and has been authorized for detection and/or diagnosis of SARS-CoV-2 by FDA under an Emergency Use Authorization (EUA). This EUA will remain in effect (meaning this test can be used) for the duration of the COVID-19 declaration under Section 564(b)(1) of the Act, 21 U.S.C. section 360bbb-3(b)(1), unless the authorization is terminated or revoked.     Resp Syncytial Virus by PCR NEGATIVE NEGATIVE Final    Comment: (NOTE) Fact Sheet for Patients: BloggerCourse.com  Fact Sheet for Healthcare Providers: SeriousBroker.it  This test is not yet approved or cleared by the [place]  FDA and has been authorized for detection and/or diagnosis of SARS-CoV-2 by FDA under an Emergency Use Authorization (EUA). This EUA will remain in effect (meaning this test can be used) for the duration of the COVID-19 declaration under Section 564(b)(1) of the Act, 21 U.S.C. section 360bbb-3(b)(1), unless the authorization is terminated or revoked.  Performed at [facility], [address], [place], [place] [address]   Blood culture (routine x 2)     Status: None (Preliminary result)   Collection Time: 05/09/24  3:37 PM   Specimen: BLOOD LEFT FOREARM  Result Value Ref Range Status   Specimen Description BLOOD LEFT FOREARM  Final   Special Requests   Final    BOTTLES DRAWN AEROBIC AND ANAEROBIC Blood Culture adequate volume   Culture   Final    NO GROWTH < 24 HOURS Performed at [facility], [address] [place], [place] [address]    Report Status PENDING  Incomplete  Radiology Studies: CT ABDOMEN PELVIS W CONTRAST Addendum Date:  05/09/2024 ADDENDUM REPORT: 05/09/2024 20:57 ADDENDUM: On further review of the images, there is a new area of soft tissue stranding in the mesenteric fat in an area of postsurgical clips and staples in the anterior mid abdomen to the right of midline. There is also what appears to be two blind ends of bowel at that location with possible pneumatosis in the walls, suspicious for ischemia. Alternatively, this could represent an area of contained perforation with associated extraluminal and mural gas. Correlation with the patient's exact surgical history related to the bowel in that area would be helpful. This is best seen on images 22-29 of series 4. Critical Value/emergent results were called by telephone at the time of interpretation on 05/09/2024 at 8:47 pm to provider [facility], who verbally acknowledged these results. Electronically Signed   By: Elspeth Bathe M.D.   On: 05/09/2024 20:57   Result Date: 05/09/2024 CLINICAL DATA:  Malaise for the past 3 days with decreased appetite and 1 episode of vomiting. Left kidney and pancreas transplant in 2009. EXAM: CT ABDOMEN AND PELVIS WITH CONTRAST TECHNIQUE: Multidetector CT imaging of the abdomen and pelvis was performed using the standard protocol following bolus administration of intravenous contrast. RADIATION DOSE REDUCTION: This exam was performed according to the departmental dose-optimization program which includes automated exposure control, adjustment of the mA and/or kV according to patient size and/or use of iterative reconstruction technique. CONTRAST:  75mL OMNIPAQUE  IOHEXOL  350 MG/ML SOLN COMPARISON:  Chest CTA obtained at the same time. Abdomen and pelvis CT dated 02/22/2021. FINDINGS: Lower chest: See the chest CTA report. Hepatobiliary: No focal liver abnormality is seen. No gallstones, gallbladder wall thickening, or biliary dilatation. Pancreas: Unremarkable. No pancreatic ductal dilatation or surrounding inflammatory changes. Spleen: Normal in size  without focal abnormality. Adrenals/Urinary Tract: Bilateral adrenal hyperplasia. Small bilateral native kidneys. Normal-appearing left pelvic and lower abdomen renal transplant with normal excretion of contrast progressing through the ureter and into the urinary bladder. The bladder has a normal appearance. Stomach/Bowel: Single mildly prominent fluid-filled loop of small bowel in the right pelvis with diameter of 2.4 cm. Unremarkable stomach and colon. The appendix is not visualized. No secondary signs of appendicitis. Vascular/Lymphatic: Atheromatous arterial calcifications without aneurysm. No enlarged lymph nodes. Reproductive: Mildly enlarged prostate gland protruding into the base of the urinary bladder. Other: Small left paraumbilical hernia containing fat and a single calcification. No abdominopelvic ascites. Musculoskeletal: Lumbar spine degenerative changes. IMPRESSION: 1. Single mildly prominent fluid-filled loop of small bowel in the right pelvis. This is nonspecific and most likely represents minimal focal ileus. 2. Normal-appearing left pelvic and lower abdomen renal transplant. 3. Mildly enlarged prostate gland protruding into the base of the urinary bladder. 4. Small left paraumbilical hernia containing fat. Electronically Signed: By: Elspeth Bathe M.D. On: 05/09/2024 18:37   CT Angio Chest PE W and/or Wo Contrast Addendum Date: 05/09/2024 ADDENDUM REPORT: 05/09/2024 18:47 ADDENDUM: These results were called by telephone at the time of interpretation on 05/09/2024 at 6:32 pm to provider ROCKY HAMILTON , who verbally acknowledged these results. Electronically Signed   By: Greig Pique M.D.   On: 05/09/2024 18:47   Result Date: 05/09/2024 CLINICAL DATA:  Tachycardia. EXAM: CT ANGIOGRAPHY CHEST WITH CONTRAST TECHNIQUE: Multidetector CT imaging of the chest was performed using the standard protocol during bolus administration of intravenous contrast. Multiplanar CT image reconstructions and MIPs were  obtained to evaluate the vascular anatomy. RADIATION DOSE REDUCTION: This exam was performed  according to the departmental dose-optimization program which includes automated exposure control, adjustment of the mA and/or kV according to patient size and/or use of iterative reconstruction technique. CONTRAST:  75mL OMNIPAQUE  IOHEXOL  350 MG/ML SOLN COMPARISON:  CT PE protocol 08/10/2006 FINDINGS: Cardiovascular: There is pulmonary embolism within the anterior left upper lobe segmental pulmonary artery. No other pulmonary emboli are seen. Heart is mildly enlarged. Aorta is normal in size. There is no pericardial effusion. There are atherosclerotic calcifications of the aorta and coronary arteries. Mediastinum/Nodes: No enlarged mediastinal, hilar, or axillary lymph nodes. Thyroid gland, trachea, and esophagus demonstrate no significant findings. Lungs/Pleura: Mild emphysema present. There are bands of atelectasis in the bilateral lower lobes. There is a 3 mm nodule in the right lower lobe image 6/73. There are calcified granulomas in the left upper lobe. There is no pleural effusion or pneumothorax. Upper Abdomen: There is bilateral renal atrophy. Musculoskeletal: No chest wall abnormality. No acute or significant osseous findings. Review of the MIP images confirms the above findings. IMPRESSION: 1. Left upper lobe segmental pulmonary embolism. Positive for acute PE with CTevidence of right heart strain (RV/LV Ratio = 1.1) consistent with at least submassive (intermediate risk) PE. The presence of right heart strain has been associated with an increased risk of morbidity and mortality. 2. Mild cardiomegaly. 3. 3 mm right solid pulmonary nodule. No follow-up needed if patient is low-risk.This recommendation follows the consensus statement: Guidelines for Management of Incidental Pulmonary Nodules Detected on CT Images: From the Fleischner Society 2017; Radiology 2017; 284:228-243. 4. Bilateral renal atrophy. Aortic  Atherosclerosis (ICD10-I70.0). Electronically Signed: By: Greig Pique M.D. On: 05/09/2024 18:22   DG Chest Port 1 View Result Date: 05/09/2024 CLINICAL DATA:  Weakness nausea vomiting diarrhea EXAM: PORTABLE CHEST 1 VIEW COMPARISON:  06/23/2023 FINDINGS: Minimal atelectasis or scar at the right base. No acute airspace disease, pleural effusion or pneumothorax. Stable cardiomediastinal silhouette. IMPRESSION: No active disease. Minimal atelectasis or scar at the right base. Electronically Signed   By: Luke Bun M.D.   On: 05/09/2024 15:09   Nilda Fendt, MD, PhD [facility]  Between 7 am - 7 pm I am available, please contact me via Amion (for emergencies) or Securechat (non urgent messages)  Between 7 pm - 7 am I am not available, please contact night coverage MD/APP via Amion

## 2024-05-10 NOTE — Progress Notes (Signed)
 PHARMACY - ANTICOAGULATION CONSULT NOTE  Pharmacy Consult for heparin  Indication: pulmonary embolus  No Known Allergies  Patient Measurements: Weight: 71.8 kg (158 lb 4.6 oz)  Vital Signs: Temp: 99.6 F (37.6 C) (08/13 1125) Temp Source: Oral (08/13 1125) BP: 178/73 (08/13 1200) Pulse Rate: 100 (08/13 1128)  Labs: Recent Labs    05/09/24 1206 05/10/24 0237 05/10/24 1226  HGB 12.3* 11.1*  --   HCT 38.4* 34.1*  --   PLT 142* 118*  --   HEPARINUNFRC  --  0.34 0.40  CREATININE 1.41* 1.35*  --   CKTOTAL 37*  --   --     CrCl cannot be calculated (Unknown ideal weight.).   Medical History: Past Medical History:  Diagnosis Date   Anxiety    COVID-19 virus infection 09/2020   CPAP (continuous positive airway pressure) dependence    Diabetes mellitus without complication (HCC)    GERD (gastroesophageal reflux disease)    Hypertension    Legally blind in left eye, as defined in [place]     PTSD (post-traumatic stress disorder)    PVD (peripheral vascular disease) (HCC) 01/17/2015   Renal disorder    Renal transplant recipient    S/P bilateral BKA (below knee amputation) (HCC)    Sleep apnea    Thyroid disease      Assessment: 73 YOM with CT angio chest positive for PE with RHS, he is not on anticoagulation PTA  8/13 AM heparin  level at goal on 1250 units/hr. Per RN, no signs/symptoms of bleeding. Given on ower end of therpaeutic and indication will increase rate to stay at goal PM heparin  drip rate 1400 uts/hr with heparin  level 0.4 remains in goal range  CBC stable   Goal of Therapy:  Heparin  level 0.3-0.7 units/ml Monitor platelets by anticoagulation protocol: Yes   Plan:  Conitnue  heparin  gtt to 1400 units/hr Daily CBC and heparin  level F/u long term [facility] plan   Olam Chalk Pharm.D. CPP, BCPS Clinical Pharmacist [phone] 05/10/2024 3:09 PM

## 2024-05-10 NOTE — Progress Notes (Signed)
 PT Cancellation Note  Patient Details Name: [name]. MRN: [id number] DOB: 1951/05/22   Cancelled Treatment:    Reason Eval/Treat Not Completed: (P) Medical issues which prohibited therapy. Pt admitted with an acute PE and heparin  was initiated at 19:40 last evening. Per PT protocol, we hold off on mobilizing pts for the first x24 hours after heparin  is initiated for an acute PE. Coordinated with MD and confirmed PT can hold off until tomorrow. Will plan to follow-up tomorrow as able.   Theo Ferretti, PT, DPT Acute Rehabilitation Services  Office: [phone]    Theo CHRISTELLA Ferretti 05/10/2024, 11:24 AM

## 2024-05-10 NOTE — Plan of Care (Signed)
  Problem: Pain Managment: Goal: General experience of comfort will improve and/or be controlled Outcome: Progressing   Problem: Safety: Goal: Ability to remain free from injury will improve Outcome: Progressing   Problem: Skin Integrity: Goal: Risk for impaired skin integrity will decrease Outcome: Progressing

## 2024-05-10 NOTE — Care Management Obs Status (Signed)
 MEDICARE OBSERVATION STATUS NOTIFICATION   Patient Details  Name: [name]. MRN: [id number] Date of Birth: 06-10-1951   Medicare Observation Status Notification Given:  Yes    [name], [name] 05/10/2024, 10:10 AM

## 2024-05-11 ENCOUNTER — Inpatient Hospital Stay (HOSPITAL_COMMUNITY)

## 2024-05-11 ENCOUNTER — Other Ambulatory Visit (HOSPITAL_COMMUNITY): Payer: Self-pay

## 2024-05-11 ENCOUNTER — Telehealth (HOSPITAL_COMMUNITY): Payer: Self-pay | Admitting: Pharmacy Technician

## 2024-05-11 DIAGNOSIS — I2609 Other pulmonary embolism with acute cor pulmonale: Secondary | ICD-10-CM

## 2024-05-11 LAB — CBC
HCT: 38.3 % — ABNORMAL LOW (ref 39.0–52.0)
Hemoglobin: 12.5 g/dL — ABNORMAL LOW (ref 13.0–17.0)
MCH: 26.9 pg (ref 26.0–34.0)
MCHC: 32.6 g/dL (ref 30.0–36.0)
MCV: 82.5 fL (ref 80.0–100.0)
Platelets: 166 K/uL (ref 150–400)
RBC: 4.64 MIL/uL (ref 4.22–5.81)
RDW: 14.3 % (ref 11.5–15.5)
WBC: 18.1 K/uL — ABNORMAL HIGH (ref 4.0–10.5)
nRBC: 0 % (ref 0.0–0.2)

## 2024-05-11 LAB — COMPREHENSIVE METABOLIC PANEL WITH GFR
ALT: 11 U/L (ref 0–44)
AST: 11 U/L — ABNORMAL LOW (ref 15–41)
Albumin: 3.5 g/dL (ref 3.5–5.0)
Alkaline Phosphatase: 92 U/L (ref 38–126)
Anion gap: 14 (ref 5–15)
BUN: 15 mg/dL (ref 8–23)
CO2: 19 mmol/L — ABNORMAL LOW (ref 22–32)
Calcium: 9.3 mg/dL (ref 8.9–10.3)
Chloride: 102 mmol/L (ref 98–111)
Creatinine, Ser: 1.28 mg/dL — ABNORMAL HIGH (ref 0.61–1.24)
GFR, Estimated: 60 mL/min — ABNORMAL LOW (ref >60)
Glucose, Bld: 204 mg/dL — ABNORMAL HIGH (ref 70–99)
Potassium: 3.8 mmol/L (ref 3.5–5.1)
Sodium: 135 mmol/L (ref 135–145)
Total Bilirubin: 1.5 mg/dL — ABNORMAL HIGH (ref 0.0–1.2)
Total Protein: 7.3 g/dL (ref 6.5–8.1)

## 2024-05-11 LAB — GLUCOSE, CAPILLARY
Glucose-Capillary: 224 mg/dL — ABNORMAL HIGH (ref 70–99)
Glucose-Capillary: 206 mg/dL — ABNORMAL HIGH (ref 70–99)
Glucose-Capillary: 220 mg/dL — ABNORMAL HIGH (ref 70–99)
Glucose-Capillary: 176 mg/dL — ABNORMAL HIGH (ref 70–99)

## 2024-05-11 LAB — HEPARIN LEVEL (UNFRACTIONATED): Heparin Unfractionated: 0.35 [IU]/mL (ref 0.30–0.70)

## 2024-05-11 LAB — ECHOCARDIOGRAM COMPLETE
AR max vel: 2.39 cm2
AV Area VTI: 2.4 cm2
AV Area mean vel: 2.65 cm2
AV Mean grad: 4.5 mmHg
AV Peak grad: 7.2 mmHg
Ao pk vel: 1.34 m/s
Area-P 1/2: 2.87 cm2
S' Lateral: 3.4 cm
Weight: 2522.06 [oz_av]

## 2024-05-11 LAB — MAGNESIUM: Magnesium: 1.8 mg/dL (ref 1.7–2.4)

## 2024-05-11 NOTE — Progress Notes (Signed)
 PHARMACY - ANTICOAGULATION CONSULT NOTE  Pharmacy Consult for heparin  Indication: pulmonary embolus  No Known Allergies  Patient Measurements: Weight: 71.5 kg (157 lb 10.1 oz)  Vital Signs: Temp: 98.9 F (37.2 C) (08/14 0734) Temp Source: Oral (08/14 0734) BP: 168/71 (08/14 0734) Pulse Rate: 109 (08/14 0734)  Labs: Recent Labs    05/09/24 1206 05/10/24 0237 05/10/24 1226 05/11/24 0235  HGB 12.3* 11.1*  --  12.5*  HCT 38.4* 34.1*  --  38.3*  PLT 142* 118*  --  166  HEPARINUNFRC  --  0.34 0.40 0.35  CREATININE 1.41* 1.35*  --  1.28*  CKTOTAL 37*  --   --   --     CrCl cannot be calculated (Unknown ideal weight.).   Medical History: Past Medical History:  Diagnosis Date   Anxiety    COVID-19 virus infection 09/2020   CPAP (continuous positive airway pressure) dependence    Diabetes mellitus without complication (HCC)    GERD (gastroesophageal reflux disease)    Hypertension    Legally blind in left eye, as defined in [place]     PTSD (post-traumatic stress disorder)    PVD (peripheral vascular disease) (HCC) 01/17/2015   Renal disorder    Renal transplant recipient    S/P bilateral BKA (below knee amputation) (HCC)    Sleep apnea    Thyroid disease      Assessment: 16 YOM with CT angio chest positive for PE with RHS, he is not on anticoagulation PTA  8/14 AM HL 0.35, on lower of therapeutic range on 1400 units/hr.  Goal of Therapy:  Heparin  level 0.3-0.7 units/ml Monitor platelets by anticoagulation protocol: Yes   Plan:  Increase heparin  gtt to 1500 units/hr Daily CBC and heparin  level F/u long term [facility] plan   Larraine Brazier, PharmD Clinical Pharmacist 05/11/2024  8:23 AM **Pharmacist phone directory can now be found on [email or web address] ([email or web address]).  Listed under [facility].

## 2024-05-11 NOTE — Inpatient Diabetes Management (Signed)
 Inpatient Diabetes Program Recommendations  AACE/ADA: New Consensus Statement on Inpatient Glycemic Control   Target Ranges:  Prepandial:   less than 140 mg/dL      Peak postprandial:   less than 180 mg/dL (1-2 hours)      Critically ill patients:  140 - 180 mg/dL    Latest Reference Range & Units 05/10/24 06:06 05/10/24 11:23 05/10/24 16:03 05/10/24 20:59 05/11/24 06:19  Glucose-Capillary 70 - 99 mg/dL 899 (H) 815 (H) 826 (H) 172 (H) 224 (H)   Review of Glycemic Control  Diabetes history: DM2 Outpatient Diabetes medications: Metformin  XR 500 mg BID, Lantus  13 units daily Current orders for Inpatient glycemic control: Novolog  0-9 units TID with meals, Novolog  0-5 units QHS  Inpatient Diabetes Program Recommendations:    Insulin : Please consider ordering Semglee  5 units Q24H.  Thanks, Earnie Gainer, RN, MSN, CDCES Diabetes Coordinator Inpatient Diabetes Program [phone] (Team Pager from 8am to 5pm)

## 2024-05-11 NOTE — Evaluation (Signed)
 Physical Therapy Evaluation Patient Details Name: [name]. MRN: [id number] DOB: Aug 31, 1951 Today's Date: 05/11/2024  History of Present Illness  Pt is a 73 y.o. male who presented 05/09/2024 with generalized weakness. Pt found to have an acute left upper lobe PE with CT evidence of right heart strain. PMH: kidney transplant, DM, GERD, HTN, PTSD, PVD, L eye blindness, sleep apnea, thyroid disease, bil BKA   Clinical Impression  Pt presents with condition above and deficits mentioned below, see PT Problem List. PTA, he was mod I utilizing his RW the majority of the time. He reports he is trying to progress to only needing a cane for mobility, but his [facility] is still too unsteady for him, requesting a QC instead. He lives with his wife and mother-in-law in a 1-level apartment with 1 STE. Currently, he displays deficits in generalized strength, balance, and endurance. He was limited in mobility distance this date by his HR varying greatly up to the 150s. He is currently requiring up to minA for transfers and CGA to ambulate short household distances with a RW. He is at risk for falls currently. He could benefit from follow-up with OPPT upon d/c to maximize his return to baseline and assist him in his goal to progress to only needing a cane for mobility. Will continue to follow acutely.        If plan is discharge home, recommend the following: A little help with walking and/or transfers;A little help with bathing/dressing/bathroom;Assistance with cooking/housework;Assist for transportation;Help with stairs or ramp for entrance   Can travel by private vehicle        Equipment Recommendations Cane (Quad cane)  Recommendations for Other Services       Functional Status Assessment Patient has had a recent decline in their functional status and demonstrates the ability to make significant improvements in function in a reasonable and predictable amount of time.     Precautions / Restrictions  Precautions Precautions: Fall;Other (comment) Precaution/Restrictions Comments: bil BKA with prostheses; L eye blind; watch HR Restrictions Weight Bearing Restrictions Per Provider Order: No      Mobility  Bed Mobility Overal bed mobility: Needs Assistance Bed Mobility: Supine to Sit     Supine to sit: Supervision, HOB elevated     General bed mobility comments: Supervision for safety exiting L EOB    Transfers Overall transfer level: Needs assistance Equipment used: Rolling walker (2 wheels) Transfers: Sit to/from Stand Sit to Stand: Min assist, +2 safety/equipment           General transfer comment: Pt needed extra time and effort to fully extend his hips and knees to power up to stand from low EOB height. MinA provided for balance when pt shifted his weight to his R leg to allow himself to reach to the RW from the bed with his L hand mid transfer.    Ambulation/Gait Ambulation/Gait assistance: Contact guard assist, +2 safety/equipment Gait Distance (Feet): 16 Feet Assistive device: Rolling walker (2 wheels) Gait Pattern/deviations: Step-through pattern, Decreased stride length, Decreased step length - right, Decreased step length - left, Trunk flexed, Knee flexed in stance - right, Knee flexed in stance - left Gait velocity: reduced Gait velocity interpretation: <1.8 ft/sec, indicate of risk for recurrent falls   General Gait Details: Pt ambulates slowly with a flexed posture, intermittently bumping into objects on his L, likely due to being blind in the L eye. CGA for safety, no LOB. Distance limited by his elevated HR  Stairs  Wheelchair Mobility     Tilt Bed    Modified Rankin (Stroke Patients Only)       Balance Overall balance assessment: Needs assistance Sitting-balance support: No upper extremity supported, Feet supported Sitting balance-Leahy Scale: Good Sitting balance - Comments: reaches off COG to manage and donn prostheses  without LOB, supervision for safety   Standing balance support: Bilateral upper extremity supported, During functional activity, Reliant on assistive device for balance Standing balance-Leahy Scale: Poor Standing balance comment: reliant on RW                             Pertinent Vitals/Pain Pain Assessment Pain Assessment: Faces Faces Pain Scale: No hurt Pain Intervention(s): Monitored during session    Home Living Family/patient expects to be discharged to:: Private residence Living Arrangements: Spouse/significant other;Other relatives (mother-in-law) Available Help at Discharge: Family;Available 24 hours/day Type of Home: Apartment Home Access: Stairs to enter   Entrance Stairs-Number of Steps: 1   Home Layout: One level Home Equipment: Cane - single point;Shower seat;Grab bars - tub/shower;Wheelchair - Forensic psychologist (2 wheels) Additional Comments: lives in retirement apartment community    Prior Function Prior Level of Function : Independent/Modified Independent             Mobility Comments: Pt typically uses his RW when ambulating but currently trying to wean off of it to a cane instead; uses w/c intermittently; x1 fall in past 6 months ADLs Comments: Pt takes prostheses off prior to transferring to edge of tub then transfers to shower chair. Pt is independent     Extremity/Trunk Assessment   Upper Extremity Assessment Upper Extremity Assessment: Defer to OT evaluation    Lower Extremity Assessment Lower Extremity Assessment: LLE deficits/detail;RLE deficits/detail RLE Deficits / Details: grossly 4 to 4+/5 MMT; denied numbness/tingling bil LLE Deficits / Details: grossly 4 to 4+/5 MMT; denied numbness/tingling bil    Cervical / Trunk Assessment Cervical / Trunk Assessment: Normal  Communication   Communication Communication: No apparent difficulties    Cognition Arousal: Alert Behavior During Therapy: WFL for tasks  assessed/performed   PT - Cognitive impairments: No apparent impairments                       PT - Cognition Comments: Pt mildly tangential with some delayed processing, but likely baseline Following commands: Intact       Cueing Cueing Techniques: Verbal cues     General Comments General comments (skin integrity, edema, etc.): HR up to 150s, varying greatly with activity, recovering well when resting    Exercises     Assessment/Plan    PT Assessment Patient needs continued PT services  PT Problem List Decreased strength;Decreased activity tolerance;Decreased balance;Decreased mobility;Cardiopulmonary status limiting activity       PT Treatment Interventions DME instruction;Gait training;Stair training;Functional mobility training;Therapeutic activities;Therapeutic exercise;Balance training;Neuromuscular re-education;Patient/family education    PT Goals (Current goals can be found in the Care Plan section)  Acute Rehab PT Goals Patient Stated Goal: to regain strength and progress to using a QC PT Goal Formulation: With patient/family Time For Goal Achievement: 05/25/24 Potential to Achieve Goals: Good    Frequency Min 2X/week     Co-evaluation   Reason for Co-Treatment: For patient/therapist safety;To address functional/ADL transfers PT goals addressed during session: Mobility/safety with mobility;Balance;Proper use of DME         AM-PAC PT 6 Clicks Mobility  Outcome Measure Help needed turning from  your back to your side while in a flat bed without using bedrails?: A Little Help needed moving from lying on your back to sitting on the side of a flat bed without using bedrails?: A Little Help needed moving to and from a bed to a chair (including a wheelchair)?: A Little Help needed standing up from a chair using your arms (e.g., wheelchair or bedside chair)?: A Little Help needed to walk in hospital room?: Total (<20 ft) Help needed climbing 3-5 steps  with a railing? : Total 6 Click Score: 14    End of Session Equipment Utilized During Treatment: Gait belt Activity Tolerance: Patient tolerated treatment well;Treatment limited secondary to medical complications (Comment) (HR elevated) Patient left: in chair;with call bell/phone within reach;with chair alarm set;with family/visitor present Nurse Communication: Mobility status;Other (comment) (HR) PT Visit Diagnosis: Unsteadiness on feet (R26.81);Other abnormalities of gait and mobility (R26.89);Muscle weakness (generalized) (M62.81);Difficulty in walking, not elsewhere classified (R26.2)    Time: 9252-9179 PT Time Calculation (min) (ACUTE ONLY): 33 min   Charges:   PT Evaluation $PT Eval Moderate Complexity: 1 Mod   PT General Charges $$ ACUTE PT VISIT: 1 Visit         Theo Ferretti, PT, DPT Acute Rehabilitation Services  Office: [phone]   Theo CHRISTELLA Ferretti 05/11/2024, 9:04 AM

## 2024-05-11 NOTE — Telephone Encounter (Signed)
 Patient Product/process development scientist completed.    The patient is insured through [employer]. Patient has Medicare and is not eligible for a copay card, but may be able to apply for patient assistance or Medicare RX Payment Plan (Patient Must reach out to their plan, if eligible for payment plan), if available.    Ran test claim for Eliquis 5 mg and the current 30 day co-pay is $297.00 due to a $250.00 deductible.  Will be $47.00 once deductible is met.   This test claim was processed through [facility]- copay amounts may vary at other pharmacies due to pharmacy/plan contracts, or as the patient moves through the different stages of their insurance plan.     Roland Earl, CPHT Pharmacy Technician III Certified Patient Advocate [facility] Patient Advocate Team Direct Number: [phone]  Fax: [phone]

## 2024-05-11 NOTE — Progress Notes (Signed)
 PROGRESS NOTE  [name]. FMW:[id number] DOB: [birth year] DOA: 05/09/2024 PCP: Stephane Leita DEL, MD   LOS: 1 day   Brief Narrative / Interim history: 73 year old male with history of renal and pancreatic transplant in 2016, on immunosuppressive therapy, bilateral BKA's, DM 2, anemia of chronic disease who has been admitted to the hospital with acute PE.  He presented with generalized weakness, mild shortness of breath.  He was placed on heparin  and admitted to the hospital  Subjective / 24h Interval events: He is feeling much better today.  No abdominal pain.  Has been eating well  Assesement and Plan: Principal Problem:   Acute pulmonary embolism (HCC) Active Problems:   Anemia of chronic disease   DM2 (diabetes mellitus, type 2) (HCC)   Leukocytosis   History of renal transplant   SIRS (systemic inflammatory response syndrome) (HCC)   Generalized weakness   History of essential hypertension   Acquired hypothyroidism   GERD (gastroesophageal reflux disease)  Principal problem Acute PE -CT angiogram on admission showed left upper lobe segmental pulmonary embolism at least submassive in nature, with CT evidence of right heart strain.  Fortunately he is not hypoxic, nor hypotensive. - Continue heparin  infusion - 2D echocardiogram still pending today  Active problems Elevated WBC, fever-this could be due to his PE.  CT scan on admission does mention some soft tissue stranding in the mesenteric fat and an area of postsurgical clips and staples.  Case was discussed overnight by EDP with Dr. Rubin who felt that the findings were consistent with normal postoperative changes without any evidence of acute intra-abdominal or intrapelvic processes.  He did have hernia repair in 2023.  He has no abdominal pain - Clinically, no abdominal pain today, white count slightly higher but he is no longer febrile  Renal/pancreas transplant-continue CellCept , tacrolimus   Hyperlipidemia-continue  statin  Hypertension-resume home amlodipine , losartan   DM 2, poorly controlled, with hyperglycemia-continue insulin , CBGs overall acceptable  Lab Results  Component Value Date   HGBA1C 7.1 (H) 05/09/2024   CBG (last 3)  Recent Labs    05/10/24 1603 05/10/24 2059 05/11/24 0619  GLUCAP 173* 172* 224*    Scheduled Meds:  amLODipine   10 mg Oral Daily   aspirin  EC  81 mg Oral Daily   atorvastatin   40 mg Oral QHS   gabapentin   100 mg Oral BID   And   gabapentin   200 mg Oral QHS   insulin  aspart  0-5 Units Subcutaneous QHS   insulin  aspart  0-9 Units Subcutaneous TID WC   losartan   25 mg Oral Daily   mycophenolate   1,000 mg Oral BID   pantoprazole   40 mg Oral BID   tacrolimus   4 mg Oral QHS   tacrolimus   5 mg Oral Daily   Continuous Infusions:  heparin  1,500 Units/hr (05/11/24 0848)   PRN Meds:.acetaminophen  **OR** acetaminophen , hydrALAZINE , melatonin, ondansetron  (ZOFRAN ) IV  Current Outpatient Medications  Medication Instructions   amLODipine  (NORVASC ) 10 mg, Oral, Daily   aspirin  EC 81 mg, Daily   atorvastatin  (LIPITOR ) 40 mg, Daily at bedtime   carboxymethylcellulose (REFRESH PLUS) 0.5 % SOLN 1 drop, Right Eye, See admin instructions, Apply 1 drop into the right eye every morning up to twice daily as needed for dry or irritated eyes.    dorzolamide-timolol (COSOPT) 2-0.5 % ophthalmic solution 1 drop, Left Eye, 2 times daily   ferrous sulfate 325 mg, Oral, Every M-W-F   Lantus  SoloStar 13 Units, Subcutaneous, Daily   losartan  (COZAAR ) 50  mg, Oral, Daily   metFORMIN  (GLUCOPHAGE -XR) 500 mg, Oral, 2 times daily   mycophenolate  (CELLCEPT ) 1,000 mg, Oral, 2 times daily   tacrolimus  (PROGRAF ) 5 mg, Daily at bedtime   tacrolimus  (PROGRAF ) 4 mg, Oral, Daily   vitamin C  (ASCORBIC ACID ) 250 mg, Oral, Every M-W-F    Diet Orders (From admission, onward)     Start     Ordered   05/09/24 2113  Diet clear liquid Room service appropriate? Yes; Fluid consistency: Thin  Diet  effective now       Comments: Advance as tolerated to full regular diet.  Question Answer Comment  Room service appropriate? Yes   Fluid consistency: Thin      05/09/24 2112            DVT prophylaxis:    Lab Results  Component Value Date   PLT 166 05/11/2024     Code Status: Full Code  Family Communication: no family at bedside   Status is: Inpatient Remains inpatient appropriate because: severity of illness  Level of care: Progressive  Consultants:  none  Objective: Vitals:   05/10/24 1900 05/11/24 0025 05/11/24 0400 05/11/24 0734  BP: (!) 177/69 (!) 173/69 (!) 170/75 (!) 168/71  Pulse: 100 (!) 104 (!) 109 (!) 109  Resp: 18 17 18 18   Temp: 99.7 F (37.6 C) 98.5 F (36.9 C) 98.7 F (37.1 C) 98.9 F (37.2 C)  TempSrc: Oral Oral Oral Oral  SpO2: 99% 95% 100% 99%  Weight:   71.5 kg     Intake/Output Summary (Last 24 hours) at 05/11/2024 1138 Last data filed at 05/11/2024 0900 Gross per 24 hour  Intake 540 ml  Output 1125 ml  Net -585 ml   Wt Readings from Last 3 Encounters:  05/11/24 71.5 kg  06/29/23 67.5 kg  06/29/23 72.1 kg    Examination:  Constitutional: NAD Eyes: lids and conjunctivae normal, no scleral icterus ENMT: mmm Neck: normal, supple Respiratory: clear to auscultation bilaterally, no wheezing, no crackles.  Cardiovascular: Regular rate and rhythm, no murmurs / rubs / gallops.  Abdomen: soft, no distention, no tenderness. Bowel sounds positive.   Data Reviewed: I have independently reviewed following labs and imaging studies   CBC Recent Labs  Lab 05/09/24 1206 05/10/24 0237 05/11/24 0235  WBC 17.7* 16.8* 18.1*  HGB 12.3* 11.1* 12.5*  HCT 38.4* 34.1* 38.3*  PLT 142* 118* 166  MCV 84.2 83.8 82.5  MCH 27.0 27.3 26.9  MCHC 32.0 32.6 32.6  RDW 15.0 14.7 14.3  LYMPHSABS  --  1.6  --   MONOABS  --  1.6*  --   EOSABS  --  0.0  --   BASOSABS  --  0.0  --     Recent Labs  Lab 05/09/24 1206 05/09/24 1543 05/09/24 1701  05/09/24 2258 05/10/24 0237 05/11/24 0235  NA 136  --   --   --  139 135  K 3.8  --   --   --  3.9 3.8  CL 100  --   --   --  107 102  CO2 23  --   --   --  21* 19*  GLUCOSE 144*  --   --   --  100* 204*  BUN 20  --   --   --  17 15  CREATININE 1.41*  --   --   --  1.35* 1.28*  CALCIUM  9.7  --   --   --  9.0 9.3  AST 13*  --   --   --  11* 11*  ALT 15  --   --   --  13 11  ALKPHOS 84  --   --   --  69 92  BILITOT 1.2  --   --   --  1.3* 1.5*  ALBUMIN 3.9  --   --   --  3.3* 3.5  MG 1.9  --   --   --  1.9 1.8  DDIMER  --   --  1.15*  --   --   --   PROCALCITON 0.33  --   --   --   --   --   LATICACIDVEN  --  1.3  --  1.3  --   --   TSH 1.617  --   --   --   --   --   HGBA1C 7.1*  --   --   --   --   --     ------------------------------------------------------------------------------------------------------------------ No results for input(s): CHOL, HDL, LDLCALC, TRIG, CHOLHDL, LDLDIRECT in the last 72 hours.  Lab Results  Component Value Date   HGBA1C 7.1 (H) 05/09/2024   ------------------------------------------------------------------------------------------------------------------ Recent Labs    05/09/24 1206  TSH 1.617    Cardiac Enzymes No results for input(s): CKMB, TROPONINI, MYOGLOBIN in the last 168 hours.  Invalid input(s): CK ------------------------------------------------------------------------------------------------------------------    Component Value Date/Time   BNP 24.0 03/14/2015 1800    CBG: Recent Labs  Lab 05/10/24 0606 05/10/24 1123 05/10/24 1603 05/10/24 2059 05/11/24 0619  GLUCAP 100* 184* 173* 172* 224*    Recent Results (from the past 240 hours)  Resp panel by RT-PCR (RSV, Flu A&B, Covid) Anterior Nasal Swab     Status: None   Collection Time: 05/09/24  2:07 PM   Specimen: Anterior Nasal Swab  Result Value Ref Range Status   SARS Coronavirus 2 by RT PCR NEGATIVE NEGATIVE Final   Influenza A by PCR  NEGATIVE NEGATIVE Final   Influenza B by PCR NEGATIVE NEGATIVE Final    Comment: (NOTE) The Xpert Xpress SARS-CoV-2/FLU/RSV plus assay is intended as an aid in the diagnosis of influenza from Nasopharyngeal swab specimens and should not be used as a sole basis for treatment. Nasal washings and aspirates are unacceptable for Xpert Xpress SARS-CoV-2/FLU/RSV testing.  Fact Sheet for Patients: BloggerCourse.com  Fact Sheet for Healthcare Providers: SeriousBroker.it  This test is not yet approved or cleared by the [place]  FDA and has been authorized for detection and/or diagnosis of SARS-CoV-2 by FDA under an Emergency Use Authorization (EUA). This EUA will remain in effect (meaning this test can be used) for the duration of the COVID-19 declaration under Section 564(b)(1) of the Act, 21 U.S.C. section 360bbb-3(b)(1), unless the authorization is terminated or revoked.     Resp Syncytial Virus by PCR NEGATIVE NEGATIVE Final    Comment: (NOTE) Fact Sheet for Patients: BloggerCourse.com  Fact Sheet for Healthcare Providers: SeriousBroker.it  This test is not yet approved or cleared by the [place]  FDA and has been authorized for detection and/or diagnosis of SARS-CoV-2 by FDA under an Emergency Use Authorization (EUA). This EUA will remain in effect (meaning this test can be used) for the duration of the COVID-19 declaration under Section 564(b)(1) of the Act, 21 U.S.C. section 360bbb-3(b)(1), unless the authorization is terminated or revoked.  Performed at [facility], [address] [place], [place] [address]   Blood culture (routine x 2)  Status: None (Preliminary result)   Collection Time: 05/09/24  3:37 PM   Specimen: BLOOD LEFT FOREARM  Result Value Ref Range Status   Specimen Description BLOOD LEFT FOREARM  Final   Special Requests   Final    BOTTLES  DRAWN AEROBIC AND ANAEROBIC Blood Culture adequate volume   Culture   Final    NO GROWTH 2 DAYS Performed at [facility], [address] [place], [place] [address]    Report Status PENDING  Incomplete     Radiology Studies: No results found.  Nilda Fendt, MD, PhD [facility]  Between 7 am - 7 pm I am available, please contact me via Amion (for emergencies) or Securechat (non urgent messages)  Between 7 pm - 7 am I am not available, please contact night coverage MD/APP via Amion

## 2024-05-11 NOTE — Evaluation (Signed)
 Occupational Therapy Evaluation Patient Details Name: [name]. MRN: [id number] DOB: August 28, 1951 Today's Date: 05/11/2024   History of Present Illness   Pt is a 73 y.o. male who presented 05/09/2024 with generalized weakness. Pt found to have an acute left upper lobe PE with CT evidence of right heart strain. PMH: kidney transplant, DM, GERD, HTN, PTSD, PVD, L eye blindness, sleep apnea, thyroid disease, bil BKA     Clinical Impressions This 73 yo male admitted with above presents to acute OT with PLOF of being independent to Mod I with all basic ADLs. Currently he is min A-setup/S for ADLs from a RW level (wants to work on getting to a QC level). He will benefit from one more session of acute OT and then we will D/C without need for follow up.     If plan is discharge home, recommend the following:   A little help with walking and/or transfers;A little help with bathing/dressing/bathroom;Assistance with cooking/housework;Assist for transportation;Help with stairs or ramp for entrance     Functional Status Assessment   Patient has had a recent decline in their functional status and demonstrates the ability to make significant improvements in function in a reasonable and predictable amount of time.     Equipment Recommendations   None recommended by OT      Precautions/Restrictions   Precautions Precautions: Fall;Other (comment) Precaution/Restrictions Comments: bil BKA with prostheses; L eye blind; watch HR Restrictions Weight Bearing Restrictions Per Provider Order: No     Mobility Bed Mobility Overal bed mobility: Needs Assistance Bed Mobility: Supine to Sit     Supine to sit: Supervision, HOB elevated     General bed mobility comments: Supervision for safety exiting L EOB    Transfers Overall transfer level: Needs assistance Equipment used: Rolling walker (2 wheels) Transfers: Sit to/from Stand Sit to Stand: Min assist, +2 safety/equipment            General transfer comment: Pt needed extra time and effort to fully extend his hips and knees to power up to stand from low EOB height. MinA provided for balance when pt shifted his weight to his R leg to allow himself to reach to the RW from the bed with his L hand mid transfer.      Balance Overall balance assessment: Needs assistance Sitting-balance support: No upper extremity supported, Feet supported Sitting balance-Leahy Scale: Good Sitting balance - Comments: reaches off COG to manage and donn prostheses without LOB, supervision for safety   Standing balance support: Bilateral upper extremity supported, During functional activity, Reliant on assistive device for balance Standing balance-Leahy Scale: Poor Standing balance comment: reliant on RW                           ADL either performed or assessed with clinical judgement   ADL Overall ADL's : Needs assistance/impaired Eating/Feeding: Independent;Sitting   Grooming: Set up;Sitting   Upper Body Bathing: Set up;Sitting   Lower Body Bathing: Minimal assistance;Sit to/from stand   Upper Body Dressing : Set up;Sitting   Lower Body Dressing: Minimal assistance;Sit to/from stand Lower Body Dressing Details (indicate cue type and reason): Able to don Bil prothesis EOB independently once they were placed within reach Toilet Transfer: Contact guard assist;Ambulation;Rolling walker (2 wheels) Toilet Transfer Details (indicate cue type and reason): simulated bed>around to sink in room>back to recliner Toileting- Clothing Manipulation and Hygiene: Contact guard assist Toileting - Clothing Manipulation Details (indicate cue type and  reason): min A sit<>stand             Vision Baseline Vision/History:  (blind in left eye) Patient Visual Report: No change from baseline              Pertinent Vitals/Pain Pain Assessment Pain Assessment: Faces Faces Pain Scale: No hurt     Extremity/Trunk Assessment  Upper Extremity Assessment Upper Extremity Assessment: Overall WFL for tasks assessed   Lower Extremity Assessment Lower Extremity Assessment: LLE deficits/detail;RLE deficits/detail RLE Deficits / Details: grossly 4 to 4+/5 MMT; denied numbness/tingling bil LLE Deficits / Details: grossly 4 to 4+/5 MMT; denied numbness/tingling bil   Cervical / Trunk Assessment Cervical / Trunk Assessment: Normal   Communication Communication Communication: No apparent difficulties   Cognition Arousal: Alert Behavior During Therapy: WFL for tasks assessed/performed                                 Following commands: Intact       Cueing  General Comments   Cueing Techniques: Verbal cues  HR up to 150s, varying greatly with activity, recovering well when resting           Home Living Family/patient expects to be discharged to:: Private residence Living Arrangements: Spouse/significant other;Other relatives (mother-in-law (has dementia)) Available Help at Discharge: Family;Available 24 hours/day Type of Home: Apartment Home Access: Stairs to enter Entrance Stairs-Number of Steps: 1 Entrance Stairs-Rails: None Home Layout: One level     Bathroom Shower/Tub: Chief Strategy Officer: Standard     Home Equipment: Cane - single point;Shower seat;Grab bars - tub/shower;Wheelchair - Forensic psychologist (2 wheels)   Additional Comments: lives in retirement apartment community      Prior Functioning/Environment Prior Level of Function : Independent/Modified Independent             Mobility Comments: Pt typically uses his RW when ambulating but currently trying to wean off of it to a quad cane instead; uses w/c intermittently; x1 fall in past 6 months ADLs Comments: Pt takes prostheses off prior to transferring to edge of tub then transfers to shower chair. Pt is independent    OT Problem List: Impaired balance (sitting and/or standing)   OT  Treatment/Interventions: Self-care/ADL training;DME and/or AE instruction;Balance training;Patient/family education      OT Goals(Current goals can be found in the care plan section)   Acute Rehab OT Goals Patient Stated Goal: to feel better and go home OT Goal Formulation: With patient Time For Goal Achievement: 05/11/24 Potential to Achieve Goals: Good   OT Frequency:  Min 2X/week    Co-evaluation PT/OT/SLP Co-Evaluation/Treatment: Yes Reason for Co-Treatment: For patient/therapist safety;To address functional/ADL transfers PT goals addressed during session: Mobility/safety with mobility;Balance;Proper use of DME OT goals addressed during session: ADL's and self-care;Proper use of Adaptive equipment and DME;Strengthening/ROM      AM-PAC OT 6 Clicks Daily Activity     Outcome Measure Help from another person eating meals?: None Help from another person taking care of personal grooming?: A Little Help from another person toileting, which includes using toliet, bedpan, or urinal?: A Little Help from another person bathing (including washing, rinsing, drying)?: A Little Help from another person to put on and taking off regular upper body clothing?: A Little Help from another person to put on and taking off regular lower body clothing?: A Little 6 Click Score: 19   End of Session Equipment Utilized During  Treatment: Gait belt;Rolling walker (2 wheels) (Bil LE prothesis) Nurse Communication: Mobility status  Activity Tolerance: Patient tolerated treatment well (despite increase in HR up to 150's) Patient left: in chair;with call bell/phone within reach;with chair alarm set;with family/visitor present  OT Visit Diagnosis: Unsteadiness on feet (R26.81);Other abnormalities of gait and mobility (R26.89);Muscle weakness (generalized) (M62.81)                Time: 9251-9179 OT Time Calculation (min): 32 min Charges:  OT General Charges $OT Visit: 1 Visit OT Evaluation $OT Eval  Moderate Complexity: 1 Mod  Cathy L. OT Acute Rehabilitation Services Office [phone]    Rodgers Dorothyann Distel 05/11/2024, 10:35 AM

## 2024-05-12 ENCOUNTER — Other Ambulatory Visit (HOSPITAL_COMMUNITY): Payer: Self-pay

## 2024-05-12 DIAGNOSIS — I2609 Other pulmonary embolism with acute cor pulmonale: Secondary | ICD-10-CM | POA: Diagnosis not present

## 2024-05-12 LAB — MAGNESIUM: Magnesium: 1.9 mg/dL (ref 1.7–2.4)

## 2024-05-12 LAB — CBC
HCT: 34.8 % — ABNORMAL LOW (ref 39.0–52.0)
Hemoglobin: 11.3 g/dL — ABNORMAL LOW (ref 13.0–17.0)
MCH: 26.6 pg (ref 26.0–34.0)
MCHC: 32.5 g/dL (ref 30.0–36.0)
MCV: 81.9 fL (ref 80.0–100.0)
Platelets: 187 K/uL (ref 150–400)
RBC: 4.25 MIL/uL (ref 4.22–5.81)
RDW: 14.3 % (ref 11.5–15.5)
WBC: 18.1 K/uL — ABNORMAL HIGH (ref 4.0–10.5)
nRBC: 0 % (ref 0.0–0.2)

## 2024-05-12 LAB — GLUCOSE, CAPILLARY
Glucose-Capillary: 225 mg/dL — ABNORMAL HIGH (ref 70–99)
Glucose-Capillary: 237 mg/dL — ABNORMAL HIGH (ref 70–99)
Glucose-Capillary: 200 mg/dL — ABNORMAL HIGH (ref 70–99)
Glucose-Capillary: 169 mg/dL — ABNORMAL HIGH (ref 70–99)

## 2024-05-12 LAB — BASIC METABOLIC PANEL WITH GFR
Anion gap: 13 (ref 5–15)
BUN: 23 mg/dL (ref 8–23)
CO2: 23 mmol/L (ref 22–32)
Calcium: 9.3 mg/dL (ref 8.9–10.3)
Chloride: 102 mmol/L (ref 98–111)
Creatinine, Ser: 1.49 mg/dL — ABNORMAL HIGH (ref 0.61–1.24)
GFR, Estimated: 50 mL/min — ABNORMAL LOW (ref >60)
Glucose, Bld: 212 mg/dL — ABNORMAL HIGH (ref 70–99)
Potassium: 3.7 mmol/L (ref 3.5–5.1)
Sodium: 138 mmol/L (ref 135–145)

## 2024-05-12 MED ORDER — APIXABAN 5 MG PO TABS: 5.0000 mg | ORAL_TABLET | Freq: Two times a day (BID) | ORAL | Status: DC

## 2024-05-12 MED ORDER — APIXABAN 5 MG PO TABS
10.0000 mg | ORAL_TABLET | Freq: Two times a day (BID) | ORAL | Status: DC
  Administered 2024-05-12 – 2024-05-13 (×3): 10 mg via ORAL
  Filled 2024-05-12 (×3): qty 2

## 2024-05-12 MED ORDER — APIXABAN (ELIQUIS) VTE STARTER PACK (10MG AND 5MG)
ORAL_TABLET | ORAL | 0 refills | Status: DC
  Filled 2024-05-12: qty 74, 30d supply, fill #0

## 2024-05-12 MED ORDER — APIXABAN (ELIQUIS) VTE STARTER PACK (10MG AND 5MG): ORAL_TABLET | ORAL | 0 refills | Status: DC

## 2024-05-12 MED ORDER — APIXABAN (ELIQUIS) VTE STARTER PACK (10MG AND 5MG): ORAL_TABLET | ORAL | 0 refills | Status: AC

## 2024-05-12 NOTE — Plan of Care (Signed)
  Problem: Coping: Goal: Ability to adjust to condition or change in health will improve Outcome: Progressing   Problem: Fluid Volume: Goal: Ability to maintain a balanced intake and output will improve Outcome: Progressing   Problem: Health Behavior/Discharge Planning: Goal: Ability to identify and utilize available resources and services will improve Outcome: Progressing   Problem: Education: Goal: Ability to describe self-care measures that may prevent or decrease complications (Diabetes Survival Skills Education) will improve Outcome: Progressing   Problem: Health Behavior/Discharge Planning: Goal: Ability to manage health-related needs will improve Outcome: Progressing   Problem: Health Behavior/Discharge Planning: Goal: Ability to identify and utilize available resources and services will improve Outcome: Progressing

## 2024-05-12 NOTE — Progress Notes (Addendum)
 PHARMACY - ANTICOAGULATION CONSULT NOTE  Pharmacy Consult for heparin  >> Eliquis  Indication: pulmonary embolus  No Known Allergies  Patient Measurements: Height: 5' 10 (177.8 cm) Weight: 71.6 kg (157 lb 13.6 oz) IBW/kg (Calculated) : 73 HEPARIN  DW (KG): 71.6  Vital Signs: Temp: 98.4 F (36.9 C) (08/15 0738) Temp Source: Oral (08/15 0738) BP: 176/84 (08/15 0738) Pulse Rate: 102 (08/15 0738)  Labs: Recent Labs    05/09/24 1206 05/10/24 0237 05/10/24 1226 05/11/24 0235 05/12/24 0241  HGB 12.3* 11.1*  --  12.5* 11.3*  HCT 38.4* 34.1*  --  38.3* 34.8*  PLT 142* 118*  --  166 187  HEPARINUNFRC  --  0.34 0.40 0.35  --   CREATININE 1.41* 1.35*  --  1.28* 1.49*  CKTOTAL 37*  --   --   --   --     Estimated Creatinine Clearance: 46.1 mL/min (A) (by C-G formula based on SCr of 1.49 mg/dL (H)).   Medical History: Past Medical History:  Diagnosis Date   Anxiety    COVID-19 virus infection 09/2020   CPAP (continuous positive airway pressure) dependence    Diabetes mellitus without complication (HCC)    GERD (gastroesophageal reflux disease)    Hypertension    Legally blind in left eye, as defined in [place]     PTSD (post-traumatic stress disorder)    PVD (peripheral vascular disease) (HCC) 01/17/2015   Renal disorder    Renal transplant recipient    S/P bilateral BKA (below knee amputation) (HCC)    Sleep apnea    Thyroid disease      Assessment: 62 YOM with CT angio chest positive for PE with RHS, he is not on anticoagulation PTA  Heparin  infusion infiltrated overnight. Swelling and redness observed in left forearm. Primary care team has consulted to stop heparin  and transition to Eliquis .   Plan:  Stop heparin  Start Eliquis  10mg  BID x7 days then 5mg  BID Monitor for s/sx of bleeding   Koren Or, PharmD Clinical Pharmacist 05/12/2024 9:53 AM Please check AMION for all [facility] Pharm[facility] numbers

## 2024-05-12 NOTE — Progress Notes (Signed)
 PROGRESS NOTE  [name]. FMW:[id number] DOB: Dec 29, 1950 DOA: 05/09/2024 PCP: Stephane Leita DEL, MD   LOS: 2 days   Brief Narrative / Interim history: 73 year old male with history of renal and pancreatic transplant in 2016, on immunosuppressive therapy, bilateral BKA's, DM 2, anemia of chronic disease who has been admitted to the hospital with acute PE.  He presented with generalized weakness, mild shortness of breath.  He was placed on heparin  and admitted to the hospital  Subjective / 24h Interval events: Doing well, no abdominal pain, does complain of his left IV being infiltrated and hurting his left arm  Assesement and Plan: Principal Problem:   Acute pulmonary embolism (HCC) Active Problems:   Anemia of chronic disease   DM2 (diabetes mellitus, type 2) (HCC)   Leukocytosis   History of renal transplant   SIRS (systemic inflammatory response syndrome) (HCC)   Generalized weakness   History of essential hypertension   Acquired hypothyroidism   GERD (gastroesophageal reflux disease)  Principal problem Acute PE -CT angiogram on admission showed left upper lobe segmental pulmonary embolism at least submassive in nature, with CT evidence of right heart strain.  Fortunately he is not hypoxic, nor hypotensive. - 2D echocardiogram reassuring with LVEF 65 to 70%, normal RV, converted to oral anticoagulation today  Active problems Elevated WBC, fever-this could be due to his PE.  CT scan on admission does mention some soft tissue stranding in the mesenteric fat and an area of postsurgical clips and staples.  Case was discussed overnight on admission by EDP with Dr. Rubin who felt that the findings were consistent with normal postoperative changes without any evidence of acute intra-abdominal or intrapelvic processes.  He did have hernia repair in 2023.  He has no abdominal pain - Without abdominal pain, but still with a leukocytosis, monitor white count for 1 more  day  Renal/pancreas transplant-continue CellCept , tacrolimus   Hyperlipidemia-continue statin  Hypertension-resume home amlodipine , losartan   DM 2, poorly controlled, with hyperglycemia-continue insulin , CBGs overall acceptable  Lab Results  Component Value Date   HGBA1C 7.1 (H) 05/09/2024   CBG (last 3)  Recent Labs    05/11/24 2059 05/12/24 0602 05/12/24 1109  GLUCAP 176* 225* 237*    Scheduled Meds:  amLODipine   10 mg Oral Daily   apixaban   10 mg Oral BID   Followed by   NOREEN ON 05/19/2024] apixaban   5 mg Oral BID   aspirin  EC  81 mg Oral Daily   atorvastatin   40 mg Oral QHS   gabapentin   100 mg Oral BID   And   gabapentin   200 mg Oral QHS   insulin  aspart  0-5 Units Subcutaneous QHS   insulin  aspart  0-9 Units Subcutaneous TID WC   losartan   25 mg Oral Daily   mycophenolate   1,000 mg Oral BID   pantoprazole   40 mg Oral BID   tacrolimus   4 mg Oral QHS   tacrolimus   5 mg Oral Daily   Continuous Infusions:   PRN Meds:.acetaminophen  **OR** acetaminophen , hydrALAZINE , melatonin, ondansetron  (ZOFRAN ) IV  Current Outpatient Medications  Medication Instructions   amLODipine  (NORVASC ) 10 mg, Oral, Daily   aspirin  EC 81 mg, Daily   atorvastatin  (LIPITOR ) 40 mg, Daily at bedtime   carboxymethylcellulose (REFRESH PLUS) 0.5 % SOLN 1 drop, Right Eye, See admin instructions, Apply 1 drop into the right eye every morning up to twice daily as needed for dry or irritated eyes.    dorzolamide-timolol (COSOPT) 2-0.5 % ophthalmic solution 1  drop, Left Eye, 2 times daily   ferrous sulfate 325 mg, Oral, Every M-W-F   Lantus  SoloStar 13 Units, Subcutaneous, Daily   losartan  (COZAAR ) 50 mg, Oral, Daily   metFORMIN  (GLUCOPHAGE -XR) 500 mg, Oral, 2 times daily   mycophenolate  (CELLCEPT ) 1,000 mg, Oral, 2 times daily   tacrolimus  (PROGRAF ) 5 mg, Daily at bedtime   tacrolimus  (PROGRAF ) 4 mg, Oral, Daily   vitamin C  (ASCORBIC ACID ) 250 mg, Oral, Every M-W-F    Diet Orders (From  admission, onward)     Start     Ordered   05/09/24 2113  Diet clear liquid Room service appropriate? Yes; Fluid consistency: Thin  Diet effective now       Comments: Advance as tolerated to full regular diet.  Question Answer Comment  Room service appropriate? Yes   Fluid consistency: Thin      05/09/24 2112            DVT prophylaxis:  apixaban  (ELIQUIS ) tablet 10 mg  apixaban  (ELIQUIS ) tablet 5 mg   Lab Results  Component Value Date   PLT 187 05/12/2024     Code Status: Full Code  Family Communication: no family at bedside   Status is: Inpatient Remains inpatient appropriate because: severity of illness  Level of care: Progressive  Consultants:  none  Objective: Vitals:   05/12/24 0509 05/12/24 0738 05/12/24 0900 05/12/24 1105  BP: (!) 159/58 (!) 176/84  (!) 149/91  Pulse: (!) 105 (!) 102  100  Resp: 18 19  17   Temp: 98.5 F (36.9 C) 98.4 F (36.9 C)  98.5 F (36.9 C)  TempSrc: Oral Oral  Oral  SpO2: 97% 96%    Weight: 71.6 kg  71.6 kg   Height:   5' 10 (1.778 m)     Intake/Output Summary (Last 24 hours) at 05/12/2024 1146 Last data filed at 05/12/2024 0700 Gross per 24 hour  Intake 706.9 ml  Output 300 ml  Net 406.9 ml   Wt Readings from Last 3 Encounters:  05/12/24 71.6 kg  06/29/23 67.5 kg  06/29/23 72.1 kg    Examination:  Constitutional: NAD Eyes: lids and conjunctivae normal, no scleral icterus ENMT: mmm Neck: normal, supple Respiratory: clear to auscultation bilaterally, no wheezing, no crackles.  Cardiovascular: Regular rate and rhythm, no murmurs / rubs / gallops. No LE edema. Abdomen: soft, no distention, no tenderness. Bowel sounds positive.   Data Reviewed: I have independently reviewed following labs and imaging studies   CBC Recent Labs  Lab 05/09/24 1206 05/10/24 0237 05/11/24 0235 05/12/24 0241  WBC 17.7* 16.8* 18.1* 18.1*  HGB 12.3* 11.1* 12.5* 11.3*  HCT 38.4* 34.1* 38.3* 34.8*  PLT 142* 118* 166 187  MCV  84.2 83.8 82.5 81.9  MCH 27.0 27.3 26.9 26.6  MCHC 32.0 32.6 32.6 32.5  RDW 15.0 14.7 14.3 14.3  LYMPHSABS  --  1.6  --   --   MONOABS  --  1.6*  --   --   EOSABS  --  0.0  --   --   BASOSABS  --  0.0  --   --     Recent Labs  Lab 05/09/24 1206 05/09/24 1543 05/09/24 1701 05/09/24 2258 05/10/24 0237 05/11/24 0235 05/12/24 0241  NA 136  --   --   --  139 135 138  K 3.8  --   --   --  3.9 3.8 3.7  CL 100  --   --   --  107 102 102  CO2 23  --   --   --  21* 19* 23  GLUCOSE 144*  --   --   --  100* 204* 212*  BUN 20  --   --   --  17 15 23   CREATININE 1.41*  --   --   --  1.35* 1.28* 1.49*  CALCIUM  9.7  --   --   --  9.0 9.3 9.3  AST 13*  --   --   --  11* 11*  --   ALT 15  --   --   --  13 11  --   ALKPHOS 84  --   --   --  69 92  --   BILITOT 1.2  --   --   --  1.3* 1.5*  --   ALBUMIN 3.9  --   --   --  3.3* 3.5  --   MG 1.9  --   --   --  1.9 1.8 1.9  DDIMER  --   --  1.15*  --   --   --   --   PROCALCITON 0.33  --   --   --   --   --   --   LATICACIDVEN  --  1.3  --  1.3  --   --   --   TSH 1.617  --   --   --   --   --   --   HGBA1C 7.1*  --   --   --   --   --   --     ------------------------------------------------------------------------------------------------------------------ No results for input(s): CHOL, HDL, LDLCALC, TRIG, CHOLHDL, LDLDIRECT in the last 72 hours.  Lab Results  Component Value Date   HGBA1C 7.1 (H) 05/09/2024   ------------------------------------------------------------------------------------------------------------------ Recent Labs    05/09/24 1206  TSH 1.617    Cardiac Enzymes No results for input(s): CKMB, TROPONINI, MYOGLOBIN in the last 168 hours.  Invalid input(s): CK ------------------------------------------------------------------------------------------------------------------    Component Value Date/Time   BNP 24.0 03/14/2015 1800    CBG: Recent Labs  Lab 05/11/24 1139 05/11/24 1627  05/11/24 2059 05/12/24 0602 05/12/24 1109  GLUCAP 206* 220* 176* 225* 237*    Recent Results (from the past 240 hours)  Resp panel by RT-PCR (RSV, Flu A&B, Covid) Anterior Nasal Swab     Status: None   Collection Time: 05/09/24  2:07 PM   Specimen: Anterior Nasal Swab  Result Value Ref Range Status   SARS Coronavirus 2 by RT PCR NEGATIVE NEGATIVE Final   Influenza A by PCR NEGATIVE NEGATIVE Final   Influenza B by PCR NEGATIVE NEGATIVE Final    Comment: (NOTE) The Xpert Xpress SARS-CoV-2/FLU/RSV plus assay is intended as an aid in the diagnosis of influenza from Nasopharyngeal swab specimens and should not be used as a sole basis for treatment. Nasal washings and aspirates are unacceptable for Xpert Xpress SARS-CoV-2/FLU/RSV testing.  Fact Sheet for Patients: BloggerCourse.com  Fact Sheet for Healthcare Providers: SeriousBroker.it  This test is not yet approved or cleared by the [place]  FDA and has been authorized for detection and/or diagnosis of SARS-CoV-2 by FDA under an Emergency Use Authorization (EUA). This EUA will remain in effect (meaning this test can be used) for the duration of the COVID-19 declaration under Section 564(b)(1) of the Act, 21 U.S.C. section 360bbb-3(b)(1), unless the authorization is terminated or revoked.     Resp Syncytial Virus by PCR  NEGATIVE NEGATIVE Final    Comment: (NOTE) Fact Sheet for Patients: BloggerCourse.com  Fact Sheet for Healthcare Providers: SeriousBroker.it  This test is not yet approved or cleared by the [place]  FDA and has been authorized for detection and/or diagnosis of SARS-CoV-2 by FDA under an Emergency Use Authorization (EUA). This EUA will remain in effect (meaning this test can be used) for the duration of the COVID-19 declaration under Section 564(b)(1) of the Act, 21 U.S.C. section 360bbb-3(b)(1),  unless the authorization is terminated or revoked.  Performed at [facility], [address] [place], [place] [address]   Blood culture (routine x 2)     Status: None (Preliminary result)   Collection Time: 05/09/24  3:37 PM   Specimen: BLOOD LEFT FOREARM  Result Value Ref Range Status   Specimen Description BLOOD LEFT FOREARM  Final   Special Requests   Final    BOTTLES DRAWN AEROBIC AND ANAEROBIC Blood Culture adequate volume   Culture   Final    NO GROWTH 3 DAYS Performed at [facility], [address], [place], [place] [address]    Report Status PENDING  Incomplete     Radiology Studies: ECHOCARDIOGRAM COMPLETE Result Date: 05/11/2024    ECHOCARDIOGRAM REPORT   Patient Name:   [name]. Date of Exam: 05/11/2024 Medical Rec #:  [id number]          Height:       56.0 in Accession #:    [id number]         Weight:       157.6 lb Date of Birth:  1950/10/27          BSA:          1.604 m Patient Age:    73 years           BP:           111/56 mmHg Patient Gender: M                  HR:           72 bpm. Exam Location:  Inpatient Procedure: 2D Echo, Cardiac Doppler and Color Doppler (Both Spectral and Color            Flow Doppler were utilized during procedure). Indications:    Pulmonary Embolus I26.09  History:        Patient has prior history of Echocardiogram examinations, most                 recent 06/11/2023. Risk Factors:Hypertension and Diabetes.  Sonographer:    Jayson Gaskins Referring Phys: [id number] JUSTIN B HOWERTER IMPRESSIONS  1. Left ventricular ejection fraction, by estimation, is 65 to 70%. The left ventricle has normal function. The left ventricle has no regional wall motion abnormalities. There is mild concentric left ventricular hypertrophy. Left ventricular diastolic parameters are indeterminate.  2. Right ventricular systolic function is normal. The right ventricular size is normal. Tricuspid regurgitation signal is inadequate for assessing PA pressure.  3.  The mitral valve is degenerative. Trivial mitral valve regurgitation. No evidence of mitral stenosis.  4. The aortic valve is grossly normal. Aortic valve regurgitation is not visualized. No aortic stenosis is present.  5. The inferior vena cava is normal in size with greater than 50% respiratory variability, suggesting right atrial pressure of 3 mmHg. Comparison(s): No significant change from prior study. FINDINGS  Left Ventricle: Left ventricular ejection fraction, by estimation, is 65 to 70%.  The left ventricle has normal function. The left ventricle has no regional wall motion abnormalities. The left ventricular internal cavity size was normal in size. There is  mild concentric left ventricular hypertrophy. Left ventricular diastolic parameters are indeterminate. Right Ventricle: The right ventricular size is normal. No increase in right ventricular wall thickness. Right ventricular systolic function is normal. Tricuspid regurgitation signal is inadequate for assessing PA pressure. Left Atrium: Left atrial size was normal in size. Right Atrium: Right atrial size was normal in size. Pericardium: Trivial pericardial effusion is present. Mitral Valve: The mitral valve is degenerative in appearance. Mild to moderate mitral annular calcification. Trivial mitral valve regurgitation. No evidence of mitral valve stenosis. Tricuspid Valve: The tricuspid valve is grossly normal. Tricuspid valve regurgitation is trivial. No evidence of tricuspid stenosis. Aortic Valve: The aortic valve is grossly normal. Aortic valve regurgitation is not visualized. No aortic stenosis is present. Aortic valve mean gradient measures 4.5 mmHg. Aortic valve peak gradient measures 7.2 mmHg. Aortic valve area, by VTI measures 2.40 cm. Pulmonic Valve: The pulmonic valve was not well visualized. Pulmonic valve regurgitation is trivial. No evidence of pulmonic stenosis. Aorta: The aortic root and ascending aorta are structurally normal, with no  evidence of dilitation. Venous: The inferior vena cava is normal in size with greater than 50% respiratory variability, suggesting right atrial pressure of 3 mmHg. IAS/Shunts: The atrial septum is grossly normal.  LEFT VENTRICLE PLAX 2D LVIDd:         4.60 cm   Diastology LVIDs:         3.40 cm   LV e' medial:   14.80 cm/s LV PW:         1.20 cm   LV E/e' medial: 5.7 LV IVS:        1.20 cm LVOT diam:     2.00 cm LV SV:         58 LV SV Index:   36 LVOT Area:     3.14 cm  RIGHT VENTRICLE TAPSE (M-mode): 2.6 cm LEFT ATRIUM           Index LA Vol (A2C): 53.1 ml 33.10 ml/m LA Vol (A4C): 40.3 ml 25.12 ml/m  AORTIC VALVE AV Area (Vmax):    2.39 cm AV Area (Vmean):   2.65 cm AV Area (VTI):     2.40 cm AV Vmax:           134.00 cm/s AV Vmean:          96.450 cm/s AV VTI:            0.244 m AV Peak Grad:      7.2 mmHg AV Mean Grad:      4.5 mmHg LVOT Vmax:         102.00 cm/s LVOT Vmean:        81.500 cm/s LVOT VTI:          0.186 m LVOT/AV VTI ratio: 0.76  AORTA Ao Root diam: 3.20 cm MITRAL VALVE MV Area (PHT): 2.87 cm     SHUNTS MV Decel Time: 264 msec     Systemic VTI:  0.19 m MV E velocity: 84.40 cm/s   Systemic Diam: 2.00 cm MV A velocity: 104.00 cm/s MV E/A ratio:  0.81 Darryle Decent MD Electronically signed by Darryle Decent MD Signature Date/Time: 05/11/2024/3:40:44 PM    Final     Nilda Fendt, MD, PhD [facility]  Between 7 am - 7 pm I am available, please contact me via  Amion (for emergencies) or Securechat (non urgent messages)  Between 7 pm - 7 am I am not available, please contact night coverage MD/APP via Amion

## 2024-05-12 NOTE — Progress Notes (Addendum)
 Physical Therapy Treatment Patient Details Name: [name]. MRN: [id number] DOB: 05-16-1951 Today's Date: 05/12/2024   History of Present Illness Pt is a 73 y.o. male who presented 05/09/2024 with generalized weakness. Pt found to have an acute left upper lobe PE with CT evidence of right heart strain. PMH: kidney transplant, DM, GERD, HTN, PTSD, PVD, L eye blindness, sleep apnea, thyroid disease, bil BKA    PT Comments  The pt reports not sleeping well last night due to L UE pain at his IV site, which appears like it was infiltrated. He reports it is painful to move and use, thus cued pt to guard it and not use it with mobility this date. The lack of his using his L UE with mobility resulted in him needing increased assistance for functional mobility, like him needing modA for bed mobility and functional transfers while utilizing a QC in his R UE this date. He also politely declined further gait attempts or exercises due to pain and fatigue. He will likely progress well once his L UE pain improves and he can adequately use his L UE to assist him with mobility again. Thus, current recommendations remain appropriate. Will continue to follow acutely.    If plan is discharge home, recommend the following: A little help with walking and/or transfers;A little help with bathing/dressing/bathroom;Assistance with cooking/housework;Assist for transportation;Help with stairs or ramp for entrance   Can travel by private vehicle        Equipment Recommendations  Cane (Quad cane)    Recommendations for Other Services       Precautions / Restrictions Precautions Precautions: Fall;Other (comment) Precaution/Restrictions Comments: bil BKA with prostheses; L eye blind; watch HR Restrictions Weight Bearing Restrictions Per Provider Order: No     Mobility  Bed Mobility Overal bed mobility: Needs Assistance Bed Mobility: Supine to Sit     Supine to sit: HOB elevated, Mod assist     General  bed mobility comments: Cued pt to keep L UE across his abdomen to reduce pain with bed mobility, with pt then requiring modA to sit up L EOB due to lack of use of L UE this date.    Transfers Overall transfer level: Needs assistance Equipment used: Quad cane Transfers: Sit to/from Stand, Bed to chair/wheelchair/BSC Sit to Stand: Mod assist   Step pivot transfers: Mod assist, Min assist       General transfer comment: QC placed in pt's R hand. ModA needed to power up to stand from EOB due to pt not utilizing his L UE due to pain and pt displaying a posterior lean. ModA needed for balance initially with step pivot to L from bed to recliner with QC in R hand, but as his posterior lean descreased he progressed to only needing minA for balance.    Ambulation/Gait Ambulation/Gait assistance: Mod assist, Min assist Gait Distance (Feet): 2 Feet Assistive device: Quad cane Gait Pattern/deviations: Decreased stride length, Trunk flexed, Knee flexed in stance - right, Knee flexed in stance - left, Step-to pattern, Decreased step length - right, Decreased step length - left, Leaning posteriorly Gait velocity: reduced Gait velocity interpretation: <1.31 ft/sec, indicative of household ambulator   General Gait Details: Pt ambulates slowly with a flexed posture and very small steps when utilizing the QC in his R hand today. Posterior lean noted initially, but as it reduced he progressed from needing modA to minA for balance. Pt politely declined attempts to ambulate further at this time   Stairs  Wheelchair Mobility     Tilt Bed    Modified Rankin (Stroke Patients Only)       Balance Overall balance assessment: Needs assistance Sitting-balance support: No upper extremity supported, Feet supported Sitting balance-Leahy Scale: Good   Postural control: Posterior lean Standing balance support: During functional activity, Reliant on assistive device for balance, Single  extremity supported Standing balance-Leahy Scale: Poor Standing balance comment: ModA fading to minA as his posterior lean reduced in standing with QC in R hand, cues provided for fixing his lean                            Communication Communication Communication: No apparent difficulties  Cognition Arousal: Alert Behavior During Therapy: WFL for tasks assessed/performed   PT - Cognitive impairments: No apparent impairments                         Following commands: Intact      Cueing Cueing Techniques: Verbal cues  Exercises      General Comments General comments (skin integrity, edema, etc.): HR 110s      Pertinent Vitals/Pain Pain Assessment Pain Assessment: Faces Faces Pain Scale: Hurts even more Pain Location: L UE IV site, appears likely infiltrated Pain Descriptors / Indicators: Discomfort, Grimacing, Guarding Pain Intervention(s): Limited activity within patient's tolerance, Monitored during session, Repositioned, Ice applied    Home Living                          Prior Function            PT Goals (current goals can now be found in the care plan section) Acute Rehab PT Goals Patient Stated Goal: to regain strength and progress to using a QC PT Goal Formulation: With patient Time For Goal Achievement: 05/25/24 Potential to Achieve Goals: Good Progress towards PT goals: Progressing toward goals    Frequency    Min 2X/week      PT Plan      Co-evaluation              AM-PAC PT 6 Clicks Mobility   Outcome Measure  Help needed turning from your back to your side while in a flat bed without using bedrails?: A Little Help needed moving from lying on your back to sitting on the side of a flat bed without using bedrails?: A Little Help needed moving to and from a bed to a chair (including a wheelchair)?: A Little Help needed standing up from a chair using your arms (e.g., wheelchair or bedside chair)?: A  Little Help needed to walk in hospital room?: Total (<20 ft) Help needed climbing 3-5 steps with a railing? : Total 6 Click Score: 14    End of Session Equipment Utilized During Treatment: Gait belt Activity Tolerance: Patient tolerated treatment well;Patient limited by pain Patient left: in chair;with call bell/phone within reach;with chair alarm set Nurse Communication: Mobility status PT Visit Diagnosis: Unsteadiness on feet (R26.81);Other abnormalities of gait and mobility (R26.89);Muscle weakness (generalized) (M62.81);Difficulty in walking, not elsewhere classified (R26.2)     Time: 1000-1017 PT Time Calculation (min) (ACUTE ONLY): 17 min  Charges:    $Therapeutic Activity: 8-22 mins PT General Charges $$ ACUTE PT VISIT: 1 Visit                     [name], PT, DPT Acute Rehabilitation Services  Office: [phone]    [name] 05/12/2024, 10:40 AM

## 2024-05-12 NOTE — TOC CM/SW Note (Addendum)
 Transition of Care [facility]) - Inpatient Brief Assessment   Patient Details  Name: [name]. MRN: [id number] Date of Birth: 1951-06-06  Transition of [facility]) CM/SW Contact:    Waddell Barnie Rama, RN Phone Number: 05/12/2024, 3:36 PM   Clinical Narrative: From home with spouse, has PCP Dr. Stephane and insurance on file, also has [employer], states has no HH services in place at this time , has walker, cane and w/chair at home.  States family member  (wife) will transport them home at [employer] and (wife)  is support system, states gets medications from [place].  Pta self ambulatory with walker.  Patient gives this NCM permission to speak with wife.  Patient will be on eliquis , he has used the 30 day trial card,  so the copay for eliquis  will be  47.00 after the 250.00 deductible has been met, right now price is 297.00,  patient states he does not have the funds for this and does not have a credit card.  NCM and TOC pharmacist Koren are speaking with the  [facility] call [facility], she is trying to get the doctor in the emergency room at the [facility] on the lline to see if they can assist us  due to patient's doctor is not in today or the pharmacist with the  [facility] is not in.   We were able to speak with the [facility] Pharmacist in [place] [facility] sent the script electronically over to the [facility] pharmacist and this NCM also faxed the script to [phone].    Per the [facility] Pharmacist he will have the eliquis  ready for pick up at the [facility] betwee 8 am and 4:30 pm on Saturday.   The Weekend Pharmacist phone number at the [place] is [phone] ext [phone] if the [facility] pharmacist needs to call for any reason.    NCM spoke with patient's wife on the phone, she will go to pick up the eliquis  in the am.  Also patient is set up with OP PT with the outpatient rehab on [facility], on [address].  NCM contacted [facility] notification line to let them know patient is here.  ID [id number].   Transition  of Care Asessment: Insurance and Status: Insurance coverage has been reviewed Patient has primary care physician: Yes Home environment has been reviewed: home with wife Prior level of function:: ambulatory with walker Prior/Current Home Services: Current home services (walker/ w/chair, cane) Social Drivers of Health Review: SDOH reviewed no interventions necessary Readmission risk has been reviewed: Yes Transition of care needs: transition of care needs identified, TOC will continue to follow

## 2024-05-13 ENCOUNTER — Other Ambulatory Visit (HOSPITAL_COMMUNITY): Payer: Self-pay

## 2024-05-13 DIAGNOSIS — I2609 Other pulmonary embolism with acute cor pulmonale: Secondary | ICD-10-CM | POA: Diagnosis not present

## 2024-05-13 LAB — CBC
HCT: 33.1 % — ABNORMAL LOW (ref 39.0–52.0)
Hemoglobin: 10.7 g/dL — ABNORMAL LOW (ref 13.0–17.0)
MCH: 27 pg (ref 26.0–34.0)
MCHC: 32.3 g/dL (ref 30.0–36.0)
MCV: 83.6 fL (ref 80.0–100.0)
Platelets: 192 K/uL (ref 150–400)
RBC: 3.96 MIL/uL — ABNORMAL LOW (ref 4.22–5.81)
RDW: 14.1 % (ref 11.5–15.5)
WBC: 14.9 K/uL — ABNORMAL HIGH (ref 4.0–10.5)
nRBC: 0 % (ref 0.0–0.2)

## 2024-05-13 LAB — GLUCOSE, CAPILLARY: Glucose-Capillary: 205 mg/dL — ABNORMAL HIGH (ref 70–99)

## 2024-05-13 NOTE — Progress Notes (Addendum)
 Occupational Therapy Treatment Patient Details Name: [name]. MRN: [id number] DOB: Apr 29, 1951 Today's Date: 05/13/2024   History of present illness Pt is a 73 y.o. male who presented 05/09/2024 with generalized weakness. Pt found to have an acute left upper lobe PE with CT evidence of right heart strain. PMH: kidney transplant, DM, GERD, HTN, PTSD, PVD, L eye blindness, sleep apnea, thyroid disease, bil BKA   OT comments  Pt. Seen for skilled OT treatment session. Able to complete bed mobility with MOD I.  Sit/stand MOD A X2 attempts before achieving full standing.  In room ambulation with mostly CGA.  Standing grooming task with MINA for initial balance once BUEs off of the RW.  Pt. Reports having all DME.  Expresses wanting to be able to ambulate with a cane vs. RW but agrees RW is good option for now and cane  could be a goal to work on with continued therapies once d/c.        If plan is discharge home, recommend the following:  A little help with walking and/or transfers;A little help with bathing/dressing/bathroom;Assistance with cooking/housework;Assist for transportation;Help with stairs or ramp for entrance   Equipment Recommendations  None recommended by OT    Recommendations for Other Services      Precautions / Restrictions Precautions Precautions: Fall;Other (comment) Precaution/Restrictions Comments: bil BKA with prostheses; L eye blind; watch HR       Mobility Bed Mobility Overal bed mobility: Modified Independent Bed Mobility: Supine to Sit           General bed mobility comments: hob flat, no rails, exited from L side but reports he can exit from either side at home    Transfers Overall transfer level: Needs assistance Equipment used: Rolling walker (2 wheels) Transfers: Sit to/from Stand, Bed to chair/wheelchair/BSC Sit to Stand: Mod assist     Step pivot transfers: Mod assist, Min assist     General transfer comment: 2x to achieve full  sit/stand.  max cues for posture, and hand placement on rw     Balance                                           ADL either performed or assessed with clinical judgement   ADL Overall ADL's : Needs assistance/impaired     Grooming: Minimal assistance;Standing;Wash/dry hands Grooming Details (indicate cue type and reason): initial unsteadiness when BUEs removed from rw to wash hands but as activty progressed he became more stable in standing             Lower Body Dressing: Set up;Sitting/lateral leans;Sit to/from stand Lower Body Dressing Details (indicate cue type and reason): Able to don Bil prothesis EOB independently once they were placed within reach Toilet Transfer: Contact guard assist;Ambulation;Rolling walker (2 wheels) Toilet Transfer Details (indicate cue type and reason): simulated with in room ambulation to/from sink around the bed to the recliner         Functional mobility during ADLs: Contact guard assist;Minimal assistance;Rolling walker (2 wheels);Cueing for safety General ADL Comments: some cues for visual compensatory strategies during ambulation around obstacles    Extremity/Trunk Assessment              Vision       Perception     Praxis     Communication Communication Communication: No apparent difficulties   Cognition Arousal: Alert Behavior  During Therapy: WFL for tasks assessed/performed                                 Following commands: Intact        Cueing   Cueing Techniques: Verbal cues  Exercises      Shoulder Instructions       General Comments      Pertinent Vitals/ Pain       Pain Assessment Pain Assessment: No/denies pain  Home Living                                          Prior Functioning/Environment              Frequency  Min 2X/week        Progress Toward Goals  OT Goals(current goals can now be found in the care plan section)  Progress  towards OT goals: Progressing toward goals     Plan      Co-evaluation                 AM-PAC OT 6 Clicks Daily Activity     Outcome Measure   Help from another person eating meals?: None Help from another person taking care of personal grooming?: A Little Help from another person toileting, which includes using toliet, bedpan, or urinal?: A Little Help from another person bathing (including washing, rinsing, drying)?: A Little Help from another person to put on and taking off regular upper body clothing?: A Little Help from another person to put on and taking off regular lower body clothing?: A Little 6 Click Score: 19    End of Session Equipment Utilized During Treatment: Gait belt;Rolling walker (2 wheels)  OT Visit Diagnosis: Unsteadiness on feet (R26.81);Other abnormalities of gait and mobility (R26.89);Muscle weakness (generalized) (M62.81)   Activity Tolerance Patient tolerated treatment well   Patient Left in chair;with call bell/phone within reach   Nurse Communication Mobility status;Other (comment) (alerted chair alarm not working properly.)        Time: [phone] OT Time Calculation (min): 23 min  Charges: OT General Charges $OT Visit: 1 Visit OT Treatments $Self Care/Home Management : 23-37 mins  Randall, COTA/L Acute Rehabilitation [phone]   CHRISTELLA Nest Lorraine-COTA/L  05/13/2024, 10:09 AM

## 2024-05-13 NOTE — Discharge Instructions (Addendum)
 Information on my medicine - ELIQUIS  (apixaban )  This medication education was reviewed with me or my healthcare representative as part of my discharge preparation.  Why was Eliquis  prescribed for you? Eliquis  was prescribed to treat blood clots that may have been found in the veins of your legs (deep vein thrombosis) or in your lungs (pulmonary embolism) and to reduce the risk of them occurring again.  What do You need to know about Eliquis  ? The starting dose is 10 mg (two 5 mg tablets) taken TWICE daily for the FIRST SEVEN (7) DAYS, then on May 19, 2024  the dose is reduced to ONE 5 mg tablet taken TWICE daily.  Eliquis  may be taken with or without food.   Try to take the dose about the same time in the morning and in the evening. If you have difficulty swallowing the tablet whole please discuss with your pharmacist how to take the medication safely.  Take Eliquis  exactly as prescribed and DO NOT stop taking Eliquis  without talking to the doctor who prescribed the medication.  Stopping may increase your risk of developing a new blood clot.  Refill your prescription before you run out.  After discharge, you should have regular check-up appointments with your healthcare provider that is prescribing your Eliquis .    What do you do if you miss a dose? If a dose of ELIQUIS  is not taken at the scheduled time, take it as soon as possible on the same day and twice-daily administration should be resumed. The dose should not be doubled to make up for a missed dose.  Important Safety Information A possible side effect of Eliquis  is bleeding. You should call your healthcare provider right away if you experience any of the following: Bleeding from an injury or your nose that does not stop. Unusual colored urine (red or dark brown) or unusual colored stools (red or black). Unusual bruising for unknown reasons. A serious fall or if you hit your head (even if there is no bleeding).  Some  medicines may interact with Eliquis  and might increase your risk of bleeding or clotting while on Eliquis . To help avoid this, consult your healthcare provider or pharmacist prior to using any new prescription or non-prescription medications, including herbals, vitamins, non-steroidal anti-inflammatory drugs (NSAIDs) and supplements.  This website has more information on Eliquis  (apixaban ): [email or web address] .[email or web address] dena

## 2024-05-13 NOTE — Discharge Summary (Signed)
 Physician Discharge Summary  [name]. FMW:[id number] DOB: June 01, 1951 DOA: 05/09/2024  PCP: Stephane Leita DEL, MD  Admit date: 05/09/2024 Discharge date: 05/13/2024  Admitted From: home Disposition:  home  Recommendations for Outpatient Follow-up:  Follow up with PCP in 1-2 weeks  Home Health: none Equipment/Devices: none  Discharge Condition: stable CODE STATUS: Full code  Brief Narrative / Interim history: 73 year old male with history of renal and pancreatic transplant in 2016, on immunosuppressive therapy, bilateral BKA's, DM 2, anemia of chronic disease who has been admitted to the hospital with acute PE.  He presented with generalized weakness, mild shortness of breath.  He was placed on heparin  and admitted to the hospital  Hospital Course / Discharge diagnoses: Principal Problem:   Acute pulmonary embolism (HCC) Active Problems:   Anemia of chronic disease   DM2 (diabetes mellitus, type 2) (HCC)   Leukocytosis   History of renal transplant   SIRS (systemic inflammatory response syndrome) (HCC)   Generalized weakness   History of essential hypertension   Acquired hypothyroidism   GERD (gastroesophageal reflux disease)  Principal problem Acute PE -CT angiogram on admission showed left upper lobe segmental pulmonary embolism at least submassive in nature, with CT evidence of right heart strain.  He was admitted to the hospital and placed on a heparin  infusion. 2D echocardiogram reassuring with LVEF 65 to 70%, normal RV.  He was not hypoxic, not hypotensive.  After initial heparin , he was transitioned to oral Eliquis , tolerating it well, no bleeding, will be discharged home in stable condition   Active problems Elevated WBC, fever-this could be due to his PE.  CT scan on admission does mention some soft tissue stranding in the mesenteric fat and an area of postsurgical clips and staples.  Case was discussed overnight on admission by EDP with Dr. Rubin who felt that  the findings were consistent with normal postoperative changes without any evidence of acute intra-abdominal or intrapelvic processes.  He did have hernia repair in 2023.  He has no abdominal pain.  He remains without abdominal pain, tolerating a diet, white count improving on its own and he is now afebrile.  Renal/pancreas transplant-continue CellCept , tacrolimus  Hyperlipidemia-continue statin Hypertension-resume home amlodipine , losartan  DM 2, poorly controlled, with hyperglycemia-continue home regimen on discharge  Sepsis ruled out   Discharge Instructions   Allergies as of 05/13/2024   No Known Allergies      Medication List     TAKE these medications    amLODipine  10 MG tablet Commonly known as: NORVASC  Take 10 mg by mouth daily.   Apixaban  Starter Pack (10mg  and 5mg ) Commonly known as: ELIQUIS  STARTER PACK Take as directed on package: start with two-5mg  tablets twice daily for 7 days. On day 8, switch to one-5mg  tablet twice daily.   aspirin  EC 81 MG tablet Take 81 mg by mouth daily.   atorvastatin  80 MG tablet Commonly known as: LIPITOR  Take 40 mg by mouth at bedtime.   carboxymethylcellulose 0.5 % Soln Commonly known as: REFRESH PLUS Place 1 drop into the right eye See admin instructions. Apply 1 drop into the right eye every morning up to twice daily as needed for dry or irritated eyes.   dorzolamide-timolol 2-0.5 % ophthalmic solution Commonly known as: COSOPT Place 1 drop into the left eye 2 (two) times daily.   ferrous sulfate 325 (65 FE) MG tablet Take 325 mg by mouth every Monday, Wednesday, and Friday.   Lantus  SoloStar 100 UNIT/ML Solostar Pen Generic drug: insulin   glargine Inject 13 Units into the skin daily.   losartan  100 MG tablet Commonly known as: COZAAR  Take 50 mg by mouth daily.   metFORMIN  500 MG 24 hr tablet Commonly known as: GLUCOPHAGE -XR Take 500 mg by mouth 2 (two) times daily.   mycophenolate  500 MG tablet Commonly known as:  CELLCEPT  Take 1,000 mg by mouth 2 (two) times daily.   tacrolimus  5 MG capsule Commonly known as: PROGRAF  Take 5 mg by mouth at bedtime. What changed: Another medication with the same name was removed. Continue taking this medication, and follow the directions you see here.   vitamin C  250 MG tablet Commonly known as: ASCORBIC ACID  Take 250 mg by mouth every Monday, Wednesday, and Friday.        Follow-up Information     Stephane Leita DEL, MD. Call in 1 week(s).   Specialty: Internal Medicine Why: please call to make hospital follow up apt, office not taking calls after 4 pm Contact information: [address] [place] [address] [phone]         [facility] Follow up.   Specialty: Rehabilitation Why: They will call you to set up apt times, if you do not hear from them in 3 business days please give them a call. thank you Contact information: [address] [place]  [phone]                Consultations: None  Procedures/Studies:  ECHOCARDIOGRAM COMPLETE Result Date: 05/11/2024    ECHOCARDIOGRAM REPORT   Patient Name:   [name]. Date of Exam: 05/11/2024 Medical Rec #:  [id number]          Height:       56.0 in Accession #:    [id number]         Weight:       157.6 lb Date of Birth:  [birth year]          BSA:          1.604 m Patient Age:    73 years           BP:           111/56 mmHg Patient Gender: M                  HR:           72 bpm. Exam Location:  Inpatient Procedure: 2D Echo, Cardiac Doppler and Color Doppler (Both Spectral and Color            Flow Doppler were utilized during procedure). Indications:    Pulmonary Embolus I26.09  History:        Patient has prior history of Echocardiogram examinations, most                 recent 06/11/2023. Risk Factors:Hypertension and Diabetes.  Sonographer:    Jayson Gaskins Referring Phys: [id number] JUSTIN B HOWERTER IMPRESSIONS   1. Left ventricular ejection fraction, by estimation, is 65 to 70%. The left ventricle has normal function. The left ventricle has no regional wall motion abnormalities. There is mild concentric left ventricular hypertrophy. Left ventricular diastolic parameters are indeterminate.  2. Right ventricular systolic function is normal. The right ventricular size is normal. Tricuspid regurgitation signal is inadequate for assessing PA pressure.  3. The mitral valve is degenerative. Trivial mitral valve regurgitation. No evidence of mitral stenosis.  4. The aortic valve is grossly normal. Aortic valve regurgitation is not  visualized. No aortic stenosis is present.  5. The inferior vena cava is normal in size with greater than 50% respiratory variability, suggesting right atrial pressure of 3 mmHg. Comparison(s): No significant change from prior study. FINDINGS  Left Ventricle: Left ventricular ejection fraction, by estimation, is 65 to 70%. The left ventricle has normal function. The left ventricle has no regional wall motion abnormalities. The left ventricular internal cavity size was normal in size. There is  mild concentric left ventricular hypertrophy. Left ventricular diastolic parameters are indeterminate. Right Ventricle: The right ventricular size is normal. No increase in right ventricular wall thickness. Right ventricular systolic function is normal. Tricuspid regurgitation signal is inadequate for assessing PA pressure. Left Atrium: Left atrial size was normal in size. Right Atrium: Right atrial size was normal in size. Pericardium: Trivial pericardial effusion is present. Mitral Valve: The mitral valve is degenerative in appearance. Mild to moderate mitral annular calcification. Trivial mitral valve regurgitation. No evidence of mitral valve stenosis. Tricuspid Valve: The tricuspid valve is grossly normal. Tricuspid valve regurgitation is trivial. No evidence of tricuspid stenosis. Aortic Valve: The aortic  valve is grossly normal. Aortic valve regurgitation is not visualized. No aortic stenosis is present. Aortic valve mean gradient measures 4.5 mmHg. Aortic valve peak gradient measures 7.2 mmHg. Aortic valve area, by VTI measures 2.40 cm. Pulmonic Valve: The pulmonic valve was not well visualized. Pulmonic valve regurgitation is trivial. No evidence of pulmonic stenosis. Aorta: The aortic root and ascending aorta are structurally normal, with no evidence of dilitation. Venous: The inferior vena cava is normal in size with greater than 50% respiratory variability, suggesting right atrial pressure of 3 mmHg. IAS/Shunts: The atrial septum is grossly normal.  LEFT VENTRICLE PLAX 2D LVIDd:         4.60 cm   Diastology LVIDs:         3.40 cm   LV e' medial:   14.80 cm/s LV PW:         1.20 cm   LV E/e' medial: 5.7 LV IVS:        1.20 cm LVOT diam:     2.00 cm LV SV:         58 LV SV Index:   36 LVOT Area:     3.14 cm  RIGHT VENTRICLE TAPSE (M-mode): 2.6 cm LEFT ATRIUM           Index LA Vol (A2C): 53.1 ml 33.10 ml/m LA Vol (A4C): 40.3 ml 25.12 ml/m  AORTIC VALVE AV Area (Vmax):    2.39 cm AV Area (Vmean):   2.65 cm AV Area (VTI):     2.40 cm AV Vmax:           134.00 cm/s AV Vmean:          96.450 cm/s AV VTI:            0.244 m AV Peak Grad:      7.2 mmHg AV Mean Grad:      4.5 mmHg LVOT Vmax:         102.00 cm/s LVOT Vmean:        81.500 cm/s LVOT VTI:          0.186 m LVOT/AV VTI ratio: 0.76  AORTA Ao Root diam: 3.20 cm MITRAL VALVE MV Area (PHT): 2.87 cm     SHUNTS MV Decel Time: 264 msec     Systemic VTI:  0.19 m MV E velocity: 84.40 cm/s   Systemic Diam: 2.00 cm MV  A velocity: 104.00 cm/s MV E/A ratio:  0.81 Darryle Decent MD Electronically signed by Darryle Decent MD Signature Date/Time: 05/11/2024/3:40:44 PM    Final    CT ABDOMEN PELVIS W CONTRAST Addendum Date: 05/09/2024 ADDENDUM REPORT: 05/09/2024 20:57 ADDENDUM: On further review of the images, there is a new area of soft tissue stranding in the  mesenteric fat in an area of postsurgical clips and staples in the anterior mid abdomen to the right of midline. There is also what appears to be two blind ends of bowel at that location with possible pneumatosis in the walls, suspicious for ischemia. Alternatively, this could represent an area of contained perforation with associated extraluminal and mural gas. Correlation with the patient's exact surgical history related to the bowel in that area would be helpful. This is best seen on images 22-29 of series 4. Critical Value/emergent results were called by telephone at the time of interpretation on 05/09/2024 at 8:47 pm to provider [facility], who verbally acknowledged these results. Electronically Signed   By: Elspeth Bathe M.D.   On: 05/09/2024 20:57   Result Date: 05/09/2024 CLINICAL DATA:  Malaise for the past 3 days with decreased appetite and 1 episode of vomiting. Left kidney and pancreas transplant in 2009. EXAM: CT ABDOMEN AND PELVIS WITH CONTRAST TECHNIQUE: Multidetector CT imaging of the abdomen and pelvis was performed using the standard protocol following bolus administration of intravenous contrast. RADIATION DOSE REDUCTION: This exam was performed according to the departmental dose-optimization program which includes automated exposure control, adjustment of the mA and/or kV according to patient size and/or use of iterative reconstruction technique. CONTRAST:  75mL OMNIPAQUE  IOHEXOL  350 MG/ML SOLN COMPARISON:  Chest CTA obtained at the same time. Abdomen and pelvis CT dated 02/22/2021. FINDINGS: Lower chest: See the chest CTA report. Hepatobiliary: No focal liver abnormality is seen. No gallstones, gallbladder wall thickening, or biliary dilatation. Pancreas: Unremarkable. No pancreatic ductal dilatation or surrounding inflammatory changes. Spleen: Normal in size without focal abnormality. Adrenals/Urinary Tract: Bilateral adrenal hyperplasia. Small bilateral native kidneys. Normal-appearing left  pelvic and lower abdomen renal transplant with normal excretion of contrast progressing through the ureter and into the urinary bladder. The bladder has a normal appearance. Stomach/Bowel: Single mildly prominent fluid-filled loop of small bowel in the right pelvis with diameter of 2.4 cm. Unremarkable stomach and colon. The appendix is not visualized. No secondary signs of appendicitis. Vascular/Lymphatic: Atheromatous arterial calcifications without aneurysm. No enlarged lymph nodes. Reproductive: Mildly enlarged prostate gland protruding into the base of the urinary bladder. Other: Small left paraumbilical hernia containing fat and a single calcification. No abdominopelvic ascites. Musculoskeletal: Lumbar spine degenerative changes. IMPRESSION: 1. Single mildly prominent fluid-filled loop of small bowel in the right pelvis. This is nonspecific and most likely represents minimal focal ileus. 2. Normal-appearing left pelvic and lower abdomen renal transplant. 3. Mildly enlarged prostate gland protruding into the base of the urinary bladder. 4. Small left paraumbilical hernia containing fat. Electronically Signed: By: Elspeth Bathe M.D. On: 05/09/2024 18:37   CT Angio Chest PE W and/or Wo Contrast Addendum Date: 05/09/2024 ADDENDUM REPORT: 05/09/2024 18:47 ADDENDUM: These results were called by telephone at the time of interpretation on 05/09/2024 at 6:32 pm to provider ROCKY HAMILTON , who verbally acknowledged these results. Electronically Signed   By: Greig Pique M.D.   On: 05/09/2024 18:47   Result Date: 05/09/2024 CLINICAL DATA:  Tachycardia. EXAM: CT ANGIOGRAPHY CHEST WITH CONTRAST TECHNIQUE: Multidetector CT imaging of the chest was performed using the standard protocol  during bolus administration of intravenous contrast. Multiplanar CT image reconstructions and MIPs were obtained to evaluate the vascular anatomy. RADIATION DOSE REDUCTION: This exam was performed according to the departmental  dose-optimization program which includes automated exposure control, adjustment of the mA and/or kV according to patient size and/or use of iterative reconstruction technique. CONTRAST:  75mL OMNIPAQUE  IOHEXOL  350 MG/ML SOLN COMPARISON:  CT PE protocol 08/10/2006 FINDINGS: Cardiovascular: There is pulmonary embolism within the anterior left upper lobe segmental pulmonary artery. No other pulmonary emboli are seen. Heart is mildly enlarged. Aorta is normal in size. There is no pericardial effusion. There are atherosclerotic calcifications of the aorta and coronary arteries. Mediastinum/Nodes: No enlarged mediastinal, hilar, or axillary lymph nodes. Thyroid gland, trachea, and esophagus demonstrate no significant findings. Lungs/Pleura: Mild emphysema present. There are bands of atelectasis in the bilateral lower lobes. There is a 3 mm nodule in the right lower lobe image 6/73. There are calcified granulomas in the left upper lobe. There is no pleural effusion or pneumothorax. Upper Abdomen: There is bilateral renal atrophy. Musculoskeletal: No chest wall abnormality. No acute or significant osseous findings. Review of the MIP images confirms the above findings. IMPRESSION: 1. Left upper lobe segmental pulmonary embolism. Positive for acute PE with CTevidence of right heart strain (RV/LV Ratio = 1.1) consistent with at least submassive (intermediate risk) PE. The presence of right heart strain has been associated with an increased risk of morbidity and mortality. 2. Mild cardiomegaly. 3. 3 mm right solid pulmonary nodule. No follow-up needed if patient is low-risk.This recommendation follows the consensus statement: Guidelines for Management of Incidental Pulmonary Nodules Detected on CT Images: From the Fleischner Society 2017; Radiology 2017; 284:228-243. 4. Bilateral renal atrophy. Aortic Atherosclerosis (ICD10-I70.0). Electronically Signed: By: Greig Pique M.D. On: 05/09/2024 18:22   DG Chest Port 1  View Result Date: 05/09/2024 CLINICAL DATA:  Weakness nausea vomiting diarrhea EXAM: PORTABLE CHEST 1 VIEW COMPARISON:  06/23/2023 FINDINGS: Minimal atelectasis or scar at the right base. No acute airspace disease, pleural effusion or pneumothorax. Stable cardiomediastinal silhouette. IMPRESSION: No active disease. Minimal atelectasis or scar at the right base. Electronically Signed   By: Luke Bun M.D.   On: 05/09/2024 15:09   CT Head Wo Contrast Result Date: 05/09/2024 CLINICAL DATA:  Headache and neuro deficit EXAM: CT HEAD WITHOUT CONTRAST TECHNIQUE: Contiguous axial images were obtained from the base of the skull through the vertex without intravenous contrast. RADIATION DOSE REDUCTION: This exam was performed according to the departmental dose-optimization program which includes automated exposure control, adjustment of the mA and/or kV according to patient size and/or use of iterative reconstruction technique. COMPARISON:  CT 02/22/2021 and MRI 06/11/2023 FINDINGS: Brain: No intracranial hemorrhage, mass effect, or evidence of acute infarct. No hydrocephalus. No extra-axial fluid collection. Age-commensurate cerebral atrophy and chronic small vessel ischemic disease. Vascular: No hyperdense vessel. Intracranial arterial calcification. Skull: No fracture or focal lesion. Sinuses/Orbits: No acute finding. Other: None. IMPRESSION: No acute intracranial abnormality. Electronically Signed   By: Norman Gatlin M.D.   On: 05/09/2024 12:39     Subjective: - no chest pain, shortness of breath, no abdominal pain, nausea or vomiting.   Discharge Exam: BP 138/63 (BP Location: Left Arm)   Pulse 99   Temp 98.3 F (36.8 C) (Oral)   Resp 16   Ht 5' 10 (1.778 m)   Wt 72.7 kg   SpO2 100%   BMI 23.00 kg/m   General: Pt is alert, awake, not in acute distress Cardiovascular:  RRR, S1/S2 +, no rubs, no gallops Respiratory: CTA bilaterally, no wheezing, no rhonchi Abdominal: Soft, NT, ND, bowel sounds  + Extremities: no edema, no cyanosis    The results of significant diagnostics from this hospitalization (including imaging, microbiology, ancillary and laboratory) are listed below for reference.     Microbiology: Recent Results (from the past 240 hours)  Resp panel by RT-PCR (RSV, Flu A&B, Covid) Anterior Nasal Swab     Status: None   Collection Time: 05/09/24  2:07 PM   Specimen: Anterior Nasal Swab  Result Value Ref Range Status   SARS Coronavirus 2 by RT PCR NEGATIVE NEGATIVE Final   Influenza A by PCR NEGATIVE NEGATIVE Final   Influenza B by PCR NEGATIVE NEGATIVE Final    Comment: (NOTE) The Xpert Xpress SARS-CoV-2/FLU/RSV plus assay is intended as an aid in the diagnosis of influenza from Nasopharyngeal swab specimens and should not be used as a sole basis for treatment. Nasal washings and aspirates are unacceptable for Xpert Xpress SARS-CoV-2/FLU/RSV testing.  Fact Sheet for Patients: BloggerCourse.com  Fact Sheet for Healthcare Providers: SeriousBroker.it  This test is not yet approved or cleared by the [place]  FDA and has been authorized for detection and/or diagnosis of SARS-CoV-2 by FDA under an Emergency Use Authorization (EUA). This EUA will remain in effect (meaning this test can be used) for the duration of the COVID-19 declaration under Section 564(b)(1) of the Act, 21 U.S.C. section 360bbb-3(b)(1), unless the authorization is terminated or revoked.     Resp Syncytial Virus by PCR NEGATIVE NEGATIVE Final    Comment: (NOTE) Fact Sheet for Patients: BloggerCourse.com  Fact Sheet for Healthcare Providers: SeriousBroker.it  This test is not yet approved or cleared by the [place]  FDA and has been authorized for detection and/or diagnosis of SARS-CoV-2 by FDA under an Emergency Use Authorization (EUA). This EUA will remain in effect (meaning this  test can be used) for the duration of the COVID-19 declaration under Section 564(b)(1) of the Act, 21 U.S.C. section 360bbb-3(b)(1), unless the authorization is terminated or revoked.  Performed at [facility], [address] [place], [place] [address]   Blood culture (routine x 2)     Status: None (Preliminary result)   Collection Time: 05/09/24  3:37 PM   Specimen: BLOOD LEFT FOREARM  Result Value Ref Range Status   Specimen Description BLOOD LEFT FOREARM  Final   Special Requests   Final    BOTTLES DRAWN AEROBIC AND ANAEROBIC Blood Culture adequate volume   Culture   Final    NO GROWTH 4 DAYS Performed at [facility], [address], [place], [place] [address]    Report Status PENDING  Incomplete     Labs: Basic Metabolic Panel: Recent Labs  Lab 05/09/24 1206 05/10/24 0237 05/11/24 0235 05/12/24 0241  NA 136 139 135 138  K 3.8 3.9 3.8 3.7  CL 100 107 102 102  CO2 23 21* 19* 23  GLUCOSE 144* 100* 204* 212*  BUN 20 17 15 23   CREATININE 1.41* 1.35* 1.28* 1.49*  CALCIUM  9.7 9.0 9.3 9.3  MG 1.9 1.9 1.8 1.9  PHOS  --  2.2*  --   --    Liver Function Tests: Recent Labs  Lab 05/09/24 1206 05/10/24 0237 05/11/24 0235  AST 13* 11* 11*  ALT 15 13 11   ALKPHOS 84 69 92  BILITOT 1.2 1.3* 1.5*  PROT 7.2 6.5 7.3  ALBUMIN 3.9 3.3* 3.5   CBC: Recent  Labs  Lab 05/09/24 1206 05/10/24 0237 05/11/24 0235 05/12/24 0241 05/13/24 0234  WBC 17.7* 16.8* 18.1* 18.1* 14.9*  NEUTROABS  --  13.4*  --   --   --   HGB 12.3* 11.1* 12.5* 11.3* 10.7*  HCT 38.4* 34.1* 38.3* 34.8* 33.1*  MCV 84.2 83.8 82.5 81.9 83.6  PLT 142* 118* 166 187 192   CBG: Recent Labs  Lab 05/12/24 0602 05/12/24 1109 05/12/24 1556 05/12/24 2159 05/13/24 0630  GLUCAP 225* 237* 200* 169* 205*   Hgb A1c No results for input(s): HGBA1C in the last 72 hours. Lipid Profile No results for input(s): CHOL, HDL, LDLCALC, TRIG, CHOLHDL, LDLDIRECT in the last 72  hours. Thyroid function studies No results for input(s): TSH, T4TOTAL, T3FREE, THYROIDAB in the last 72 hours.  Invalid input(s): FREET3 Urinalysis    Component Value Date/Time   COLORURINE YELLOW 05/09/2024 1534   APPEARANCEUR HAZY (A) 05/09/2024 1534   LABSPEC 1.017 05/09/2024 1534   PHURINE 5.0 05/09/2024 1534   GLUCOSEU NEGATIVE 05/09/2024 1534   HGBUR NEGATIVE 05/09/2024 1534   BILIRUBINUR NEGATIVE 05/09/2024 1534   KETONESUR NEGATIVE 05/09/2024 1534   PROTEINUR >=300 (A) 05/09/2024 1534   UROBILINOGEN 1.0 09/29/2013 2339   NITRITE NEGATIVE 05/09/2024 1534   LEUKOCYTESUR NEGATIVE 05/09/2024 1534    FURTHER DISCHARGE INSTRUCTIONS:   Get Medicines reviewed and adjusted: Please take all your medications with you for your next visit with your Primary MD   Laboratory/radiological data: Please request your Primary MD to go over all hospital tests and procedure/radiological results at the follow up, please ask your Primary MD to get all Hospital records sent to his/her office.   In some cases, they will be blood work, cultures and biopsy results pending at the time of your discharge. Please request that your primary care M.D. goes through all the records of your hospital data and follows up on these results.   Also Note the following: If you experience worsening of your admission symptoms, develop shortness of breath, life threatening emergency, suicidal or homicidal thoughts you must seek medical attention immediately by calling 911 or calling your MD immediately  if symptoms less severe.   You must read complete instructions/literature along with all the possible adverse reactions/side effects for all the Medicines you take and that have been prescribed to you. Take any new Medicines after you have completely understood and accpet all the possible adverse reactions/side effects.    Do not drive when taking Pain medications or sleeping medications (Benzodaizepines)    Do not take more than prescribed Pain, Sleep and Anxiety Medications. It is not advisable to combine anxiety,sleep and pain medications without talking with your primary care practitioner   Special Instructions: If you have smoked or chewed Tobacco  in the last 2 yrs please stop smoking, stop any regular Alcohol  and or any Recreational drug use.   Wear Seat belts while driving.   Please note: You were cared for by a hospitalist during your hospital stay. Once you are discharged, your primary care physician will handle any further medical issues. Please note that NO REFILLS for any discharge medications will be authorized once you are discharged, as it is imperative that you return to your primary care physician (or establish a relationship with a primary care physician if you do not have one) for your post hospital discharge needs so that they can reassess your need for medications and monitor your lab values.  Time coordinating discharge: 40 minutes  SIGNED:  Nilda Fendt, MD, PhD 05/13/2024, 9:09 AM

## 2024-05-13 NOTE — Progress Notes (Signed)
 PT Cancellation Note  Patient Details Name: [name]. MRN: [id number] DOB: 1951/01/03   Cancelled Treatment:    Reason Eval/Treat Not Completed: (P) Patient declined, no reason specified. Pt and wife declining any questions or concerns for PT prior to d/c home today, verbalizing that the wife is able to provide the needed support at d/c and feels comfortable with her technique to assist pt with functional mobility. Educated pt and his wife that pt is currently requiring modA for transfers. Educated pt to use his RW with all standing mobility at this time. They verbalized understanding and no further questions for PT at this time. If pt remains hospitalized, will plan to follow-up another day.   Theo Ferretti, PT, DPT Acute Rehabilitation Services  Office: [phone]    Theo CHRISTELLA Ferretti 05/13/2024, 11:30 AM

## 2024-05-13 NOTE — Plan of Care (Signed)
   Problem: Education: Goal: Ability to describe self-care measures that may prevent or decrease complications (Diabetes Survival Skills Education) will improve Outcome: Progressing Goal: Individualized Educational Video(s) Outcome: Progressing   Problem: Coping: Goal: Ability to adjust to condition or change in health will improve Outcome: Progressing   Problem: Health Behavior/Discharge Planning: Goal: Ability to identify and utilize available resources and services will improve Outcome: Progressing Goal: Ability to manage health-related needs will improve Outcome: Progressing   Problem: Metabolic: Goal: Ability to maintain appropriate glucose levels will improve Outcome: Progressing   Problem: Tissue Perfusion: Goal: Adequacy of tissue perfusion will improve Outcome: Progressing

## 2024-05-14 LAB — CULTURE, BLOOD (ROUTINE X 2)
Culture: NO GROWTH
Special Requests: ADEQUATE

## 2024-08-16 ENCOUNTER — Emergency Department (HOSPITAL_COMMUNITY): Admission: EM | Admit: 2024-08-16 | Discharge: 2024-08-16 | Disposition: A | Discharge status: Home / Self Care

## 2024-08-16 ENCOUNTER — Other Ambulatory Visit: Payer: Self-pay

## 2024-08-16 ENCOUNTER — Encounter (HOSPITAL_COMMUNITY): Payer: Self-pay | Admitting: Emergency Medicine

## 2024-08-16 DIAGNOSIS — R1032 Left lower quadrant pain: Secondary | ICD-10-CM | POA: Insufficient documentation

## 2024-08-16 DIAGNOSIS — Z0189 Encounter for other specified special examinations: Secondary | ICD-10-CM | POA: Insufficient documentation

## 2024-08-16 DIAGNOSIS — R7989 Other specified abnormal findings of blood chemistry: Secondary | ICD-10-CM | POA: Diagnosis present

## 2024-08-16 DIAGNOSIS — Z7982 Long term (current) use of aspirin: Secondary | ICD-10-CM | POA: Diagnosis not present

## 2024-08-16 DIAGNOSIS — Z940 Kidney transplant status: Secondary | ICD-10-CM | POA: Diagnosis not present

## 2024-08-16 LAB — CBC WITH DIFFERENTIAL/PLATELET
Abs Immature Granulocytes: 0.12 K/uL — ABNORMAL HIGH (ref 0.00–0.07)
Basophils Absolute: 0 K/uL (ref 0.0–0.1)
Basophils Relative: 1 %
Eosinophils Absolute: 0.1 K/uL (ref 0.0–0.5)
Eosinophils Relative: 2 %
HCT: 40.5 % (ref 39.0–52.0)
Hemoglobin: 12.7 g/dL — ABNORMAL LOW (ref 13.0–17.0)
Immature Granulocytes: 2 %
Lymphocytes Relative: 22 %
Lymphs Abs: 1.5 K/uL (ref 0.7–4.0)
MCH: 26.1 pg (ref 26.0–34.0)
MCHC: 31.4 g/dL (ref 30.0–36.0)
MCV: 83.2 fL (ref 80.0–100.0)
Monocytes Absolute: 0.6 K/uL (ref 0.1–1.0)
Monocytes Relative: 8 %
Neutro Abs: 4.5 K/uL (ref 1.7–7.7)
Neutrophils Relative %: 65 %
Platelets: 155 K/uL (ref 150–400)
RBC: 4.87 MIL/uL (ref 4.22–5.81)
RDW: 14.9 % (ref 11.5–15.5)
WBC: 6.8 K/uL (ref 4.0–10.5)
nRBC: 0 % (ref 0.0–0.2)

## 2024-08-16 LAB — URINALYSIS, ROUTINE W REFLEX MICROSCOPIC
Bilirubin Urine: NEGATIVE
Glucose, UA: >=500 mg/dL — AB
Hgb urine dipstick: NEGATIVE
Ketones, ur: NEGATIVE mg/dL
Leukocytes,Ua: NEGATIVE
Nitrite: NEGATIVE
Protein, ur: 30 mg/dL — AB
Specific Gravity, Urine: 1.021 (ref 1.005–1.030)
pH: 5 (ref 5.0–8.0)

## 2024-08-16 LAB — COMPREHENSIVE METABOLIC PANEL WITH GFR
ALT: 22 U/L (ref 0–44)
AST: 17 U/L (ref 15–41)
Albumin: 4.2 g/dL (ref 3.5–5.0)
Alkaline Phosphatase: 114 U/L (ref 38–126)
Anion gap: 14 (ref 5–15)
BUN: 22 mg/dL (ref 8–23)
CO2: 22 mmol/L (ref 22–32)
Calcium: 10 mg/dL (ref 8.9–10.3)
Chloride: 99 mmol/L (ref 98–111)
Creatinine, Ser: 1.4 mg/dL — ABNORMAL HIGH (ref 0.61–1.24)
GFR, Estimated: 54 mL/min — ABNORMAL LOW (ref >60)
Glucose, Bld: 398 mg/dL — ABNORMAL HIGH (ref 70–99)
Potassium: 4.7 mmol/L (ref 3.5–5.1)
Sodium: 135 mmol/L (ref 135–145)
Total Bilirubin: 0.7 mg/dL (ref 0.0–1.2)
Total Protein: 7.1 g/dL (ref 6.5–8.1)

## 2024-08-16 NOTE — ED Triage Notes (Signed)
 Pt sent from PCP due to elevated Cr and for fluids. Labs drawn last month. Reports possible constipation. Also states he has intermittent pain to left side from kidney transplant.

## 2024-08-16 NOTE — ED Provider Notes (Signed)
 [place] [facility] AT [facility] Provider Note   CSN: [id number] Arrival date & time: 08/16/24  1148     Patient presents with: Abnormal Lab   [name]. is a 73 y.o. male.   73 year old male with a past medical history of kidney transplant in 2008 to the ED via POV with a chief complaint of abnormal labs.  Patient reports he had lab work done yesterday at the [facility], was called due to an elevated creatinine with a level of 1.7, was also told that he may need fluids.  He does endorse some left-sided flank pain that is been ongoing for some time, has been eating and drinking normally.  He is currently eating chicken wings while in the stretcher of the hospital.  Does tell me that sometimes he has a pain around the left lower quadrant where his site of transplant was. No fever, no vomiting, no other complaints reported.  The history is provided by the patient.  Abnormal Lab      Prior to Admission medications   Medication Sig Start Date End Date Taking? Authorizing Provider  amLODipine  (NORVASC ) 10 MG tablet Take 10 mg by mouth daily.    [provider]  APIXABAN  (ELIQUIS ) VTE STARTER PACK (10MG  AND 5MG ) Take as directed on package: start with two-5mg  tablets twice daily for 7 days. On day 8, switch to one-5mg  tablet twice daily. 05/12/24   Gherghe, Costin M, MD  aspirin  EC 81 MG tablet Take 81 mg by mouth daily.    [provider]  atorvastatin  (LIPITOR ) 80 MG tablet Take 40 mg by mouth at bedtime.     [provider]  carboxymethylcellulose (REFRESH PLUS) 0.5 % SOLN Place 1 drop into the right eye See admin instructions. Apply 1 drop into the right eye every morning up to twice daily as needed for dry or irritated eyes.    [provider]  dorzolamide-timolol (COSOPT) 2-0.5 % ophthalmic solution Place 1 drop into the left eye 2 (two) times daily.    [provider]  ferrous sulfate 325 (65 FE) MG tablet Take 325 mg  by mouth every Monday, Wednesday, and Friday.    [provider]  insulin  glargine (LANTUS  SOLOSTAR) 100 UNIT/ML Solostar Pen Inject 13 Units into the skin daily. 07/08/23   Setzer, Sandra J, PA-C  losartan  (COZAAR ) 100 MG tablet Take 50 mg by mouth daily.    [provider]  metFORMIN  (GLUCOPHAGE -XR) 500 MG 24 hr tablet Take 500 mg by mouth 2 (two) times daily.    [provider]  mycophenolate  (CELLCEPT ) 500 MG tablet Take 1,000 mg by mouth 2 (two) times daily.    [provider]  tacrolimus  (PROGRAF ) 5 MG capsule Take 5 mg by mouth at bedtime.    [provider]  vitamin C  (ASCORBIC ACID ) 250 MG tablet Take 250 mg by mouth every Monday, Wednesday, and Friday.    [provider]    Allergies: Patient has no known allergies.    Review of Systems  Constitutional:  Negative for chills and fever.  Respiratory:  Negative for shortness of breath.   Cardiovascular:  Negative for chest pain.  Gastrointestinal:  Negative for abdominal pain, nausea and vomiting.  Genitourinary:  Negative for flank pain.  Musculoskeletal:  Negative for back pain.  Neurological:  Negative for facial asymmetry, weakness, light-headedness and headaches.  All other systems reviewed and are negative.   Updated Vital Signs BP (!) 187/70 (BP Location:  Left Arm)   Pulse (!) 50   Temp 98.1 F (36.7 C)   Resp 19   Ht 5' 10 (1.778 m)   Wt 72.7 kg   SpO2 99%   BMI 23.00 kg/m   Physical Exam Vitals and nursing note reviewed.  Constitutional:      Appearance: He is well-developed.  HENT:     Head: Normocephalic and atraumatic.  Eyes:     General: No scleral icterus.    Pupils: Pupils are equal, round, and reactive to light.  Cardiovascular:     Heart sounds: Normal heart sounds.  Pulmonary:     Effort: Pulmonary effort is normal.     Breath sounds: Normal breath sounds. No wheezing.  Chest:     Chest wall: No tenderness.  Abdominal:     General: Bowel  sounds are normal. There is no distension.     Palpations: Abdomen is soft.     Tenderness: There is no abdominal tenderness.  Musculoskeletal:        General: No tenderness or deformity.     Cervical back: Normal range of motion.     Right Lower Extremity: Right leg is amputated above knee.     Left Lower Extremity: Left leg is amputated above knee.  Skin:    General: Skin is warm and dry.  Neurological:     Mental Status: He is alert and oriented to person, place, and time.     (all labs ordered are listed, but only abnormal results are displayed) Labs Reviewed  COMPREHENSIVE METABOLIC PANEL WITH GFR - Abnormal; Notable for the following components:      Result Value   Glucose, Bld 398 (*)    Creatinine, Ser 1.40 (*)    GFR, Estimated 54 (*)    All other components within normal limits  CBC WITH DIFFERENTIAL/PLATELET - Abnormal; Notable for the following components:   Hemoglobin 12.7 (*)    Abs Immature Granulocytes 0.12 (*)    All other components within normal limits  URINALYSIS, ROUTINE W REFLEX MICROSCOPIC - Abnormal; Notable for the following components:   Glucose, UA >=500 (*)    Protein, ur 30 (*)    Bacteria, UA RARE (*)    All other components within normal limits    EKG: None  Radiology: No results found.   Procedures   Medications Ordered in the ED - No data to display                                  Medical Decision Making   This patient presents to the ED for concern of abnormal lab, this involves a number of treatment options, and is a complaint that carries with it a high risk of complications and morbidity.  The differential diagnosis includes AKI, transplant refusal, other lab abnormality.   Co morbidities: Discussed in HPI   Brief History:  See HPI.  EMR reviewed including pt PMHx, past surgical history and past visits to ER.   See HPI for more details   Lab Tests:  I ordered and independently interpreted labs.  The pertinent  results include:    CBC with no leukocytosis, hemoglobin is slightly decreased but at his baseline.  CMP with no electrolyte derangement, creatinine is 1.4, consistent with his baseline.  LFTs are within normal limits.  No anion gap.   Imaging Studies:  No imaging studies ordered for this patient  Cardiac Monitoring:  N/A  Medicines ordered:  N/A  Reevaluation:  After the interventions noted above I re-evaluated patient and found that they have :improved  Social Determinants of Health:  The patient's social determinants of health were a factor in the care of this patient  Problem List / ED Course:  Presents to the ED with a chief complaint of abnormal lab.  Reports he went to the [facility] yesterday where he had blood work done and noted that his creatinine was elevated at 1.7, he does have a previous history of kidney transplant 2008.  He is here without any nausea, no vomiting, no fevers, no other complaints.  His blood work here that showed a creatinine of 1.4, consistent with his baseline.  His GFR is about the same.  Electrolytes are otherwise within normal limits.  CBC with no leukocytosis.  UA is noninfectious.  He is ambulatory here and eating chicken wings in his hallway bed.  I did discuss with him hydrating with oral fluids versus IV.  He is not having any pain at this time, he does get some twitches to the left lower quadrant which happens sometimes however he has not had any other problems.  I do not feel that patient warrants further emergent workup at this time.  Patient is agreeable of this, I did discuss this could to my attending Dr. Gennaro who also agrees with workup.  Patient will be discharged for follow-up with PCP with strict return precautions.  Dispostion:  After consideration of the diagnostic results and the patients response to treatment, I feel that the patent would benefit from close follow up with primary care physician.     Portions of this note were  generated with Scientist, clinical (histocompatibility and immunogenetics). Dictation errors may occur despite best attempts at proofreading.   Final diagnoses:  Laboratory test    ED Discharge Orders     None          Maureen Broad, PA-C 08/16/24 1917    Gennaro Duwaine CROME, DO 08/17/24 1730

## 2024-08-16 NOTE — ED Triage Notes (Signed)
 Pt reports he was sent by [facility] provider.  Pt reports having lab work done recently and thinks there is something from w/ his kidney function.  Pt had kidney transplant in 2009.  Pt isn't exactly sure why he is being sent.  Sts they were supposed to send paperwork over.

## 2024-08-16 NOTE — ED Notes (Signed)
 Pt d/c home per EDP order. Discharge summary reviewed, pt verbalizes understanding. Reports discharge ride home

## 2024-08-16 NOTE — Discharge Instructions (Addendum)
 Your creatinine was within your baseline on today's visit.   Continue with your normal regimen and oral intake.  Please follow-up closely with your primary care physician.

## 2024-08-16 NOTE — ED Provider Triage Note (Signed)
 Emergency Medicine Provider Triage Evaluation Note  [name]. , a 73 y.o. male  was evaluated in triage.  Pt complains of abnormal creatinine at the [facility] and intermittent left-sided.  Review of Systems  Positive: Intermittent left-sided flank pain Negative: Fever, chills, nausea, vomiting, urinary symptoms  Physical Exam  BP (!) 159/74   Pulse 75   Temp 97.8 F (36.6 C)   Resp 17   Ht 5' 10 (1.778 m)   Wt 72.7 kg   SpO2 96%   BMI 23.00 kg/m  Gen:   Awake, no distress   Resp:  Normal effort  MSK:   Moves extremities without difficulty  Other:    Medical Decision Making  Medically screening exam initiated at 12:46 PM.  Appropriate orders placed.  [name] Dimartino. was informed that the remainder of the evaluation will be completed by another provider, this initial triage assessment does not replace that evaluation, and the importance of remaining in the ED until their evaluation is complete.  Labs ordered   Francis Ileana SAILOR, PA-C 08/16/24 1248

## 2024-10-20 ENCOUNTER — Other Ambulatory Visit (HOSPITAL_COMMUNITY): Payer: Self-pay | Admitting: Nephrology

## 2024-10-20 DIAGNOSIS — Z940 Kidney transplant status: Secondary | ICD-10-CM

## 2024-11-17 ENCOUNTER — Ambulatory Visit (HOSPITAL_COMMUNITY)
